# Patient Record
Sex: Male | Born: 1935
Health system: Southern US, Community
[De-identification: ages and names within clinical notes are randomized; demographics above are authoritative.]

## PROBLEM LIST (undated history)

## (undated) ENCOUNTER — Emergency Department (HOSPITAL_COMMUNITY): Payer: Commercial Managed Care - HMO | Source: Home / Self Care

## (undated) DIAGNOSIS — E785 Hyperlipidemia, unspecified: Secondary | ICD-10-CM

## (undated) DIAGNOSIS — F32A Depression, unspecified: Secondary | ICD-10-CM

## (undated) DIAGNOSIS — E119 Type 2 diabetes mellitus without complications: Secondary | ICD-10-CM

## (undated) DIAGNOSIS — F03918 Unspecified dementia, unspecified severity, with other behavioral disturbance: Secondary | ICD-10-CM

## (undated) DIAGNOSIS — R739 Hyperglycemia, unspecified: Secondary | ICD-10-CM

## (undated) DIAGNOSIS — H409 Unspecified glaucoma: Secondary | ICD-10-CM

## (undated) DIAGNOSIS — I10 Essential (primary) hypertension: Secondary | ICD-10-CM

## (undated) DIAGNOSIS — I639 Cerebral infarction, unspecified: Secondary | ICD-10-CM

## (undated) DIAGNOSIS — M1990 Unspecified osteoarthritis, unspecified site: Secondary | ICD-10-CM

## (undated) DIAGNOSIS — F329 Major depressive disorder, single episode, unspecified: Secondary | ICD-10-CM

## (undated) DIAGNOSIS — F0391 Unspecified dementia with behavioral disturbance: Secondary | ICD-10-CM

## (undated) DIAGNOSIS — I2510 Atherosclerotic heart disease of native coronary artery without angina pectoris: Secondary | ICD-10-CM

## (undated) HISTORY — DX: Cerebral infarction, unspecified: I63.9

## (undated) HISTORY — DX: Essential (primary) hypertension: I10

## (undated) HISTORY — DX: Unspecified osteoarthritis, unspecified site: M19.90

## (undated) HISTORY — DX: Hyperlipidemia, unspecified: E78.5

## (undated) HISTORY — DX: Major depressive disorder, single episode, unspecified: F32.9

## (undated) HISTORY — DX: Depression, unspecified: F32.A

## (undated) HISTORY — DX: Unspecified glaucoma: H40.9

## (undated) HISTORY — DX: Type 2 diabetes mellitus without complications: E11.9

## (undated) HISTORY — DX: Atherosclerotic heart disease of native coronary artery without angina pectoris: I25.10

## (undated) HISTORY — PX: OTHER SURGICAL HISTORY: SHX169

---

## 2000-02-19 ENCOUNTER — Encounter: Payer: Self-pay | Admitting: Internal Medicine

## 2000-02-19 ENCOUNTER — Encounter: Payer: Self-pay | Admitting: Emergency Medicine

## 2000-02-19 ENCOUNTER — Inpatient Hospital Stay (HOSPITAL_COMMUNITY): Admission: EM | Admit: 2000-02-19 | Discharge: 2000-02-20 | Payer: Self-pay | Admitting: Emergency Medicine

## 2001-06-19 ENCOUNTER — Encounter: Payer: Self-pay | Admitting: Otolaryngology

## 2001-06-19 ENCOUNTER — Encounter: Admission: RE | Admit: 2001-06-19 | Discharge: 2001-06-19 | Payer: Self-pay | Admitting: *Deleted

## 2003-06-15 ENCOUNTER — Inpatient Hospital Stay (HOSPITAL_COMMUNITY): Admission: EM | Admit: 2003-06-15 | Discharge: 2003-06-19 | Payer: Self-pay | Admitting: Emergency Medicine

## 2003-07-12 ENCOUNTER — Emergency Department (HOSPITAL_COMMUNITY): Admission: EM | Admit: 2003-07-12 | Discharge: 2003-07-12 | Payer: Self-pay | Admitting: Emergency Medicine

## 2003-10-09 ENCOUNTER — Encounter: Admission: RE | Admit: 2003-10-09 | Discharge: 2003-10-09 | Payer: Self-pay | Admitting: Family Medicine

## 2004-04-10 ENCOUNTER — Emergency Department (HOSPITAL_COMMUNITY): Admission: EM | Admit: 2004-04-10 | Discharge: 2004-04-10 | Payer: Self-pay | Admitting: Emergency Medicine

## 2004-05-08 ENCOUNTER — Emergency Department (HOSPITAL_COMMUNITY): Admission: EM | Admit: 2004-05-08 | Discharge: 2004-05-08 | Payer: Self-pay | Admitting: Emergency Medicine

## 2004-08-06 ENCOUNTER — Emergency Department (HOSPITAL_COMMUNITY): Admission: EM | Admit: 2004-08-06 | Discharge: 2004-08-06 | Payer: Self-pay | Admitting: Emergency Medicine

## 2008-12-30 ENCOUNTER — Emergency Department (HOSPITAL_COMMUNITY): Admission: EM | Admit: 2008-12-30 | Discharge: 2008-12-30 | Payer: Self-pay | Admitting: Family Medicine

## 2009-04-23 ENCOUNTER — Encounter: Admission: RE | Admit: 2009-04-23 | Discharge: 2009-04-23 | Payer: Self-pay | Admitting: Family Medicine

## 2010-12-18 NOTE — Discharge Summary (Signed)
[name], [name] NO.:  [id number]   MEDICAL RECORD NO.:  [id number]                   PATIENT TYPE:  INP   LOCATION:  6522                                 FACILITY:  [facility]   PHYSICIAN:  Meade Maw, M.D.                 DATE OF BIRTH:  11/17/1921   DATE OF ADMISSION:  06/16/2003  DATE OF DISCHARGE:  06/19/2003                                 DISCHARGE SUMMARY   ADMITTING DIAGNOSES:  1. Atypical chest pain.  2. Palpitations.  3. Non-insulin-dependent diabetes.  4. Hypertension.   DISCHARGE DIAGNOSES:  1. Borderline obstructive coronary artery disease to the left anterior     descending by catheterization, June 18, 2003.  2. Hypertension, controlled.  3. Palpitations, status post Holter monitor, June 03, 2003 through     June 05, 2003, revealing sinus rhythm with frequent premature atrial     contractions and premature ventricular contractions.  4. Non-insulin-dependent diabetes, controlled.   PROCEDURE:  Left heart catheterization, June 18, 2003.   COMPLICATIONS:  None.   DISCHARGE STATUS:  Stable.  Improved.   HISTORY OF PRESENT ILLNESS:  Please see complete H&P for details, but in  short, Mr. [name] is a very pleasant 77 year old male with history of  diabetes and hypertension.  He presented to the emergency room on June 16, 2003 with complaint of chest discomfort as well as palpitations that  began while he was having an argument with his daughter.  He was given  sublingual nitroglycerin in the emergency room with some relief.  He had  been having palpitations prior to this admission.  He was seen by Dr. Meade Maw and had a Holter monitor placed, which showed simple PVCs and PABs.  He was started on Toprol at that point.   PHYSICAL EXAM ON ADMISSION:  Please see complete H&P.  VITAL SIGNS:  Vital signs were stable.  He was afebrile.  SKIN:  Skin was warm and dry.  LUNGS:  Lungs were clear.  HEART:  Heart  regular rate and rhythm.  No murmur.  ABDOMEN:  Abdomen was benign with normal bowel sounds.  EXTREMITIES:  No peripheral edema.  NEUROLOGIC:  No neurological deficits.   LABORATORY DATA:  Admission labs including CBC, BMP and cardiac markers were  all normal.   EKG showed a normal sinus rhythm with diffuse ST and T wave changes.   HOSPITAL COURSE:  The patient was admitted to rule out MI with serial  enzymes.  He was started on IV nitroglycerin and IV heparin.  Plans for a  stress test were implemented.   By day 2 of admission, he had no further complaints of chest discomfort.  Cardiac enzymes were all negative.  Vital signs remained stable.  Exam was  unchanged.  IV nitroglycerin and IV heparin were discontinued at that point.  He was scheduled for a stress Cardiolite study.  Adenosine Cardiolite was actually performed on June 17, 2003.  He did  have some anterolateral ST depression of approximately 2 mm with exercise.  Review of Cardiolite images revealed decreased uptake in the anterior and  inferior basilar sections.  There was some partial reversibility of the  anterior wall.   At this point, he was scheduled for cardiac catheterization.  He was taken  to the heart cath lab, June 18, 2003, for ventriculography and coronary  definitions by Dr. Fraser Din.  Results showed a normal left main, the LAD with  a mid 60-70% lesion with significant calcification.  The circumflex had no  significant disease.  The RCA was normal.  There is no documentation at the  time of this dictation as to LV function.  EF was noted to be 30% by nuclear  study, June 17, 2003.   The patient underwent successful PCI and CYPHER stent placement to the mid  LAD by Dr. Katrinka Blazing.  He tolerated the procedure well.  The rest of his  admission was essentially uneventful.  Vital signs remained stable.  EKG  post procedure showed some deepening of the T wave inversions in the  inferior leads only.   Serum creatinine was 1.0 post catheterization.  CBC  was stable.   He was discharged home without incident.   MEDICATIONS:  1. Plavix 75 mg daily.  2. Aspirin 325 mg daily.  3. Toprol 50 mg daily.  4. Avandia 4 mg daily.  5. Cozaar 50 mg daily.  6. Lipitor 40 mg daily.  7. Glucovance 5/500 mg b.i.d.  He was instructed not to restart his     Glucovance until Friday, June 21, 2003.   ACTIVITY:  He is not to undertake any strenuous activities for the next two  days, i.e., no lifting more than 5 pounds, bending, stooping or straining.  He is not to drive today.  He may resume his normal activities on Friday,  June 21, 2003.   DIET:  He is to maintain a low-salt, low-fat, low-cholesterol diet as well  as maintain his diabetic diet restrictions.   SPECIAL DISCHARGE INSTRUCTIONS:  He may shower when he gets home.  Gently  wash the right groin catheterization site with warm water and soap.  He has  been instructed not to soak in a bathtub for the next several days.   If he experiences pain, bruising, swelling to the catheterization site or  any problems, he is to contact Dr. Fraser Din.   FOLLOWUP:  He has a followup appointment to see Dr. Fraser Din on Wednesday,  July 03, 2003, at 11 a.m.      Adrian Saran, N.P.                        Meade Maw, M.D.    HB/MEDQ  D:  06/19/2003  T:  06/20/2003  Job:  [id number]

## 2010-12-18 NOTE — Cardiovascular Report (Signed)
[name], [name] NO.:  [id number]   MEDICAL RECORD NO.:  [id number]                   PATIENT TYPE:  INP   LOCATION:  2022                                 FACILITY:  [facility]   PHYSICIAN:  Meade Maw, M.D.                 DATE OF BIRTH:  Jul 13, 1922   DATE OF PROCEDURE:  06/18/2003  DATE OF DISCHARGE:                              CARDIAC CATHETERIZATION   INDICATIONS FOR PROCEDURE:  Ongoing chest pain, reversible ischemia noted on  Cardiolite.   PROCEDURE:  After obtaining written, informed consent the patient was  brought to the cardiac catheterization laboratory in the postabsorptive  state.  Preoperative sedation was achieved using Versed 2 mg IV.  The right  groin was prepped and draped in a usual sterile fashion.  Local anesthesia  was achieved using 1% Xylocaine.  A 6-French hemostasis sheath was placed  into the right femoral artery using the modified Seldinger technique.  Selective coronary angiography was performed using a JL4, JR4 Judkins  catheter.  Multiple views were obtained.  All catheter exchanges were made  over a guidewire.  Single plane ventriculogram was performed in the RAO  position using a 6-French pigtail curved catheter.  The films were reviewed  with Lyn Records, M.D.   FINDINGS:  The aortic pressure was 112/61.  LV pressure was 111/6.  EDP is  12.  Single plane ventriculogram revealed normal wall motion, ejection  fraction 65-70%.   CORONARY ANGIOGRAPHY:  The left main coronary artery bifurcates into the  left anterior descending and circumflex vessel.  There is no disease in the  left main coronary artery.   Left anterior descending gives rise to multiple diagonals.  Following the  third diagonal there is a 60-70% lesion noted.  The left anterior descending  then gives rise to another moderate sized diagonal and goes on to end as an  apical branch.   Circumflex vessel is a large vessel.  Gives rise to an early  large OM1,  smaller OM2, large OM3, and ends as a lateral branch.  There is no disease  in the circumflex or its branches.   Right coronary artery is a large artery.  Gives rise to three small RV  marginals, a PDA, and a PL branch.  There are luminal irregularities in the  right coronary artery only.   FINAL IMPRESSION:  Borderline disease in the mid left anterior descending.  However, the patient has had recurrent chest pain and reversible ischemia in  this region.  Lyn Records, M.D. was consulted.  He will proceed with  intervention on the mid left anterior descending lesion.  Meade Maw, M.D.    HP/MEDQ  D:  06/18/2003  T:  06/18/2003  Job:  [id number]   cc:   Bryan Lemma. Manus Gunning, M.D.  [address] [place]  [place] [address]  Fax: [phone]

## 2010-12-18 NOTE — Cardiovascular Report (Signed)
[name], [name] NO.:  [id number]   MEDICAL RECORD NO.:  [id number]                   PATIENT TYPE:  INP   LOCATION:  2022                                 FACILITY:  [facility]   PHYSICIAN:  Lesleigh Noe, M.D.            DATE OF BIRTH:  1922/07/08   DATE OF PROCEDURE:  06/18/2003  DATE OF DISCHARGE:                              CARDIAC CATHETERIZATION   PROCEDURE PERFORMED:  Drug-eluting stent implantation in the proximal left  anterior descending.   INDICATIONS:  Borderline significant proximal LAD lesion and an abnormal  Cardiolite documenting ischemia in the distal territory beyond the  obstruction.   PROCEDURE PERFORMED:  1. Cutting balloon angioplasty.  2. Drug-eluting stent implantation.  3. Post dilatation with balloon angioplasty.   DESCRIPTION OF PROCEDURE:  After informed consent, 4100 units of IV heparin  was administered as a double bolus followed by an infusion of Integrilin was  started and 300 mg of Plavix was administered.   We used an [employer] guidewire and a 7-French #4 left Judkins guide  catheter.  We obtained position across the stenosis in the LAD and used a 10  mm x 3.5 mm diameter cutting balloon.  Peak pressure 14 atmospheres.  Two  balloon inflations were performed.  We then implanted a 3.5 x 18 mm long  Cypher stent and deployed it to 16 atmospheres which was a 3.7 mm diameter.  We then post dilated with a 4.0 x 15 mm Quantum Maverick high pressure  balloon to 17 atmospheres which was an effective diameter of 4.15 mm.  The  patient had chest discomfort citing it less intense than the discomfort that  he had that required admission to the hospital, but certainly of the same  quality during each balloon inflation.  ACT post procedure approximately 241  seconds.  The patient was taken to the holding area in stable condition with  pending septal angiographic results.   RESULTS:  60-70% proximal LAD within a  heavily calcified segment reduced to  less than 10% with TIMI grade 3 flow following Cypher drug-eluting stent  implantation and expansion to 4 mm in diameter with high pressure  ballooning.   CONCLUSION:  Successful drug-eluting stent implantation in the proximal left  anterior descending with reduction in 70% stenosis to less than 10% with  TIMI grade 3 flow.   PLAN:  Aspirin and Plavix for six months.  Integrilin x18 hours.  Discharge  June 19, 2003.                                               Lesleigh Noe, M.D.    HWS/MEDQ  D:  06/18/2003  T:  06/18/2003  Job:  [id number]   cc:   Meade Maw, M.D.  [address]  [place]  [place] [address]  Fax: [phone]   Bryan Lemma. Manus Gunning, M.D.  [address] [place]  [place] [address]  Fax: [phone]

## 2010-12-18 NOTE — Discharge Summary (Signed)
[facility]  Patient:    [name], [name]                        MRN: [id number] Adm. Date:  [id number] Disc. Date: [id number] Attending:  Edwyna Perfect Dictator:   Bernerd Pho, M.D. CC:         [facility]                           Discharge Summary  DISCHARGE DIAGNOSIS:  Left-sided chest pain secondary to musculoskeletal etiology.  DISCHARGE MEDICATIONS: 1. Motrin 800 mg p.o. t.i.d. x next 14 days, then can wean if tolerated. 2. The patient is to resume Glucotrol at the dose he was on prior to    admission.  FOLLOW-UP:  The patient is to see his primary care doctor at [facility].  CONSULTATIONS:  None.  PROCEDURES:  Echocardiogram performed on February 19, 2000, showed a normal left ventricle with mild asymmetric left ventricular hypertrophy.  The ejection fraction is normal.  CHIEF COMPLAINT:  Left-sided chest pain.  HISTORY OF PRESENT ILLNESS:  The patient is a 77 year old African-American male with a history of diabetes who presents with a three to four month history of stabbing left-sided chest pain.  The patient states that his pain is brought on by certain positions and is alleviated by other positions.  He states that his pain is particularly worse when he pushes on his left rib cage. The patient initially treated this pain with Tylenol and aspirin with some success, but lately it has provided no relief.  The patient denies any shortness of breath, nausea, vomiting, diaphoresis, dyspnea on exertion, or pain with exertion.  The patient also denies any history of trauma or fracture to the chest and denies any weight loss, decrease in appetite, decrease in energy.  Patient acknowledges cough but no sputum production or hemoptysis. The patient denies any cardiac history and he denies any pulmonary diseases. He denies any history of pneumonia.  Because the pain has persisted, the patient decided to present to the  emergency department this morning.  PAST MEDICAL HISTORY: 1. Non-insulin-dependent diabetes mellitus for the past eight years. 2. Epidermal inclusion cysts.  MEDICATIONS ON ADMISSION: 1. Glucotrol unknown dose b.i.d. 2. Tylenol/aspirin prn.  ALLERGIES:  No known drug allergies.  FAMILY HISTORY:  The patient denies any family history of early MIs or CAD.  SOCIAL HISTORY:  The patient states that he has a third to fourth grade education.  The patient has been married to his second wife for the past 24 years and has five kids from his first marriage.  The patient is retired and before retirement used to work at the [employer].  He quit smoking roughly 40 years ago and denies any alcohol or drug use.  PHYSICAL EXAMINATION:  VITAL SIGNS:  Temperature 97.1, blood pressure 177/99, heart rate 65, respirations 20, oxygen saturation on room air 96%.  GENERAL:  The patient is in no apparent distress and appears comfortable.  HEENT:  Pupils equal, round, and reactive to light.  His sclerae, conjunctivae, and cornea are clear.  And his nasal and ______  without any lesions.  CARDIOVASCULAR:  Regular rate and rhythm without any murmurs, clicks, gallops, rubs, or JVD.  PULMONARY:  Clear to auscultation bilaterally with good air movement.  The patient has reproducible point tenderness at the left mid axillary line near the subcostal margin.  ABDOMEN:  The patient has roughly 1 cm mobile nodule below his xiphoid. Otherwise his abdomen is benign, nontender, nondistended, no hepatosplenomegaly, and has positive bowel sounds throughout.  EXTREMITIES:  No edema.  NEUROLOGIC:  Nonfocal.  ADMISSION LABORATORIES:  Admission electrolytes are as follows:  Sodium 139, potassium 4.1, chloride 105, bicarb 32, BUN 8, creatinine .9, glucose 193. The patients liver function tests are as follows:  AST 41, ALT 39, alk phos 49, total bilirubin .7.  The patients complete blood count is as  follows: White blood cell count 4.2, hemoglobin 13.7, and platelets 180.  The patients initial set of cardiac enzymes are negative.  Troponin I at .04, CK 325, and CK-MB of 1.7.  His PTT is 30, and his PT-INR is 1.1.  The patients chest x-ray was without any acute disease, and his rib films are negative for any rib fractures.  The patients EKG showed normal sinus rhythm with inverted T waves in leads V3 through V6 and an AVL.  HOSPITAL COURSE:  LEFT-SIDED CHEST PAIN:  Because of the patients inverted T waves, he was admitted to rule out for an acute MI.  The patient ruled out for an acute MI by serial EKGs, cardiac enzymes, and telemetry.  The patients EKG did not change in regards to his inverted T waves and his anterolateral leads. Because the patients chest pain was exacerbated by certain positions and was reproducible by palpation of the left mid axillary line near the subcostal margin, it was thought that his pain was secondary to musculoskeletal etiology.  It was thought that the patient might benefit from ______  NSAID therapy, and the patient was discharged with instructions to take Motrin 800 mg p.o. t.i.d. for the next 14 days.  The patient could then titrate his NSAID use based on symptoms.  DIET:   Low-sugar diabetic diet.  RESIDENT PHYSICIAN:  Leory Plowman, M.D. and Bernerd Pho, M.D.  ATTENDING PHYSICIAN:  C. Ulyess Mort, M.D. DD:  02/22/00 TD:  02/24/00 Job: [id number]

## 2010-12-18 NOTE — H&P (Signed)
NAMEBROLY, HATFIELD NO.:  [id number]   MEDICAL RECORD NO.:  [id number]                   PATIENT TYPE:  INP   LOCATION:  2022                                 FACILITY:  [facility]   PHYSICIAN:  Colleen Can. Deborah Chalk, M.D.            DATE OF BIRTH:  [birth year]   DATE OF ADMISSION:  06/15/2003  DATE OF DISCHARGE:                                HISTORY & PHYSICAL   CHIEF COMPLAINT:  Chest pain and palpitations.   HISTORY:  [name] is a 77 year old black male who has multiple medical  problems.  He presents to the emergency room accompanied by his daughter,  [name].  He had had the onset of chest discomfort as well as palpitations  that occurred during an argument with his daughter.  He was subsequently  brought to the emergency room where he was given nitroglycerin with  subsequent resolution.  The patient is a very poor historian.  He notes that  he had seen Meade Maw, M.D. this past week.  There is a question of a  Holter monitor that was placed.  He is unsure if he actually had a follow-up  appointment.  Changes in his medicines may have occurred but he is not sure.  He was subsequently seen and evaluated in the emergency room, was admitted  for further management.   PAST MEDICAL HISTORY:  1. Non-insulin-dependent diabetes for 13-14 years.  2. Hypertension.  3. He denies a history of stroke, cancer, or heart attack.  4. He is unsure about his cholesterol levels.  5. He is unsure if he has had previous surgical procedures.   ALLERGIES:  Unknown.   MEDICATIONS:  1. Cozaar.  2. Glucophage.  3. Avandia.  4. Questionable new heart medicine.  His doses are unknown.   FAMILY HISTORY:  He states that his parents just died.  He denies a  history of heart disease with his parents.   SOCIAL HISTORY:  He remarried approximately one year ago.  He previously was  a widower.  He has had no recent tobacco or alcohol.  He is very involved in  his  church.   REVIEW OF SYSTEMS:  He denies being dizzy or lightheaded.  He has had chest  pain off and on for quite some time.  Duration is exactly unknown.  He  states he has had palpitations off and on.  He feels that most of his  problems are stress related.  He admits to being depressed from the death of  his former wife.  He is not happy with his current wife due to differences  in religious beliefs.   PHYSICAL EXAMINATION:  GENERAL:  He is anxious.  He is crying.  He almost  seems somewhat confused at times.  He is in no acute distress.  VITAL SIGNS:  Blood pressure 96/67, heart rate 60.  SKIN:  Warm and dry.  Color is  unremarkable.  LUNGS:  Basically clear.  He does have poor effort.  CARDIAC:  Regular rhythm.  ABDOMEN:  Soft, positive bowel sounds.  EXTREMITIES:  Without edema.   LABORATORIES:  Hematocrit 33, white count 4000.  Chemistries are normal.  Glucose 109.  His cardiac markers are negative.  EKG showed sinus rhythm.  There are diffuse ST and T-wave changes which are unchanged from previous  tracings.   OVERALL IMPRESSION:  1. Atypical chest pain.  2. Palpitations with recent Holter with questionable results.  3. Diabetes.  4. Hypertension.   PLAN:  He is admitted to the service of Meade Maw, M.D.  We will need to  obtain his Holter monitor results.  He is currently on IV nitroglycerin and  heparin and will continue those.  He is pain-free.  Will make consideration  for Cardiolite in the morning versus outpatient testing, but will defer that  decision to Meade Maw, M.D.      Juanell Fairly C. Earl Gala, N.P.                 Colleen Can. Deborah Chalk, M.D.    LCO/MEDQ  D:  06/16/2003  T:  06/16/2003  Job:  [id number]

## 2011-02-05 LAB — BASIC METABOLIC PANEL
BUN: 8 mg/dL (ref 4–21)
Creatinine: 1.1 mg/dL (ref 0.6–1.3)
Glucose: 134 mg/dL
Potassium: 5.2 mmol/L (ref 3.4–5.3)
Sodium: 144 mmol/L (ref 137–147)

## 2011-02-05 LAB — LIPID PANEL
Cholesterol: 166 mg/dL (ref 0–200)
HDL: 25 mg/dL — AB (ref 35–70)
LDL Cholesterol: 78 mg/dL
Triglycerides: 365 mg/dL — AB (ref 40–160)

## 2011-02-05 LAB — HEPATIC FUNCTION PANEL
ALT: 29 U/L (ref 10–40)
AST: 36 U/L (ref 14–40)
Alkaline Phosphatase: 42 U/L (ref 25–125)
Bilirubin, Total: 0.5 mg/dL

## 2011-02-05 LAB — HEMOGLOBIN A1C: Hgb A1c MFr Bld: 6.2 % — AB (ref 4.0–6.0)

## 2012-02-16 LAB — BASIC METABOLIC PANEL
BUN: 15 mg/dL (ref 4–21)
Creatinine: 1.4 mg/dL — AB (ref 0.6–1.3)
Glucose: 135 mg/dL
Potassium: 4.7 mmol/L (ref 3.4–5.3)
Sodium: 141 mmol/L (ref 137–147)

## 2012-02-16 LAB — LIPID PANEL
Cholesterol: 136 mg/dL (ref 0–200)
HDL: 33 mg/dL — AB (ref 35–70)
LDL Cholesterol: 79 mg/dL
Triglycerides: 106 mg/dL (ref 40–160)

## 2012-02-16 LAB — HEMOGLOBIN A1C: Hgb A1c MFr Bld: 6.2 % — AB (ref 4.0–6.0)

## 2012-02-16 LAB — HEPATIC FUNCTION PANEL
ALT: 30 U/L (ref 10–40)
AST: 49 U/L — AB (ref 14–40)
Alkaline Phosphatase: 47 U/L (ref 25–125)
Bilirubin, Total: 1.1 mg/dL

## 2012-08-21 LAB — LIPID PANEL
Cholesterol: 158 mg/dL (ref 0–200)
HDL: 37 mg/dL (ref 35–70)
LDL Cholesterol: 85 mg/dL
Triglycerides: 181 mg/dL — AB (ref 40–160)

## 2012-08-21 LAB — BASIC METABOLIC PANEL
BUN: 12 mg/dL (ref 4–21)
Creatinine: 1.2 mg/dL (ref 0.6–1.3)
Glucose: 212 mg/dL
Potassium: 3.9 mmol/L (ref 3.4–5.3)
Sodium: 141 mmol/L (ref 137–147)

## 2012-08-21 LAB — HEPATIC FUNCTION PANEL
ALT: 21 U/L (ref 10–40)
AST: 29 U/L (ref 14–40)
Alkaline Phosphatase: 41 U/L (ref 25–125)

## 2012-08-21 LAB — HEMOGLOBIN A1C: Hgb A1c MFr Bld: 7.4 % — AB (ref 4.0–6.0)

## 2013-03-26 ENCOUNTER — Ambulatory Visit: Payer: Self-pay | Admitting: Internal Medicine

## 2013-06-02 HISTORY — PX: CARDIAC CATHETERIZATION: SHX172

## 2013-06-22 ENCOUNTER — Encounter: Payer: Self-pay | Admitting: Internal Medicine

## 2013-06-22 ENCOUNTER — Other Ambulatory Visit (INDEPENDENT_AMBULATORY_CARE_PROVIDER_SITE_OTHER): Payer: Medicare Other

## 2013-06-22 ENCOUNTER — Ambulatory Visit (INDEPENDENT_AMBULATORY_CARE_PROVIDER_SITE_OTHER): Payer: Medicare Other | Admitting: Internal Medicine

## 2013-06-22 VITALS — BP 140/72 | HR 88 | Temp 98.9°F | Ht 71.0 in | Wt 182.4 lb

## 2013-06-22 DIAGNOSIS — I10 Essential (primary) hypertension: Secondary | ICD-10-CM

## 2013-06-22 DIAGNOSIS — M1990 Unspecified osteoarthritis, unspecified site: Secondary | ICD-10-CM | POA: Insufficient documentation

## 2013-06-22 DIAGNOSIS — E785 Hyperlipidemia, unspecified: Secondary | ICD-10-CM

## 2013-06-22 DIAGNOSIS — Z0000 Encounter for general adult medical examination without abnormal findings: Secondary | ICD-10-CM

## 2013-06-22 DIAGNOSIS — Z23 Encounter for immunization: Secondary | ICD-10-CM

## 2013-06-22 DIAGNOSIS — I2510 Atherosclerotic heart disease of native coronary artery without angina pectoris: Secondary | ICD-10-CM

## 2013-06-22 DIAGNOSIS — H409 Unspecified glaucoma: Secondary | ICD-10-CM | POA: Insufficient documentation

## 2013-06-22 DIAGNOSIS — E119 Type 2 diabetes mellitus without complications: Secondary | ICD-10-CM

## 2013-06-22 DIAGNOSIS — E1151 Type 2 diabetes mellitus with diabetic peripheral angiopathy without gangrene: Secondary | ICD-10-CM | POA: Insufficient documentation

## 2013-06-22 DIAGNOSIS — I25119 Atherosclerotic heart disease of native coronary artery with unspecified angina pectoris: Secondary | ICD-10-CM | POA: Insufficient documentation

## 2013-06-22 LAB — BASIC METABOLIC PANEL
BUN: 11 mg/dL (ref 6–23)
CO2: 34 mEq/L — ABNORMAL HIGH (ref 19–32)
Calcium: 9.6 mg/dL (ref 8.4–10.5)
Chloride: 99 mEq/L (ref 96–112)
Creatinine, Ser: 1.1 mg/dL (ref 0.4–1.5)
GFR: 84.56 mL/min (ref >60.00)
Glucose, Bld: 184 mg/dL — ABNORMAL HIGH (ref 70–99)
Potassium: 4.3 mEq/L (ref 3.5–5.1)
Sodium: 139 mEq/L (ref 135–145)

## 2013-06-22 LAB — MICROALBUMIN / CREATININE URINE RATIO
Creatinine,U: 218.3 mg/dL
Microalb Creat Ratio: 3.9 mg/g (ref 0.0–30.0)
Microalb, Ur: 8.5 mg/dL — ABNORMAL HIGH (ref 0.0–1.9)

## 2013-06-22 LAB — LIPID PANEL
Cholesterol: 159 mg/dL (ref 0–200)
HDL: 38.1 mg/dL — ABNORMAL LOW (ref >39.00)
LDL Cholesterol: 91 mg/dL (ref 0–99)
Total CHOL/HDL Ratio: 4
Triglycerides: 150 mg/dL — ABNORMAL HIGH (ref 0.0–149.0)
VLDL: 30 mg/dL (ref 0.0–40.0)

## 2013-06-22 LAB — HEMOGLOBIN A1C: Hgb A1c MFr Bld: 7.3 % — ABNORMAL HIGH (ref 4.6–6.5)

## 2013-06-22 MED ORDER — ASPIRIN EC 81 MG PO TBEC: 81.0000 mg | DELAYED_RELEASE_TABLET | Freq: Every day | ORAL | Status: DC

## 2013-06-22 NOTE — Progress Notes (Signed)
Pre-visit discussion using our clinic review tool. No additional management support is needed unless otherwise documented below in the visit note.  

## 2013-06-22 NOTE — Progress Notes (Signed)
Subjective:    Patient ID: [name], male    DOB: 02-13-1922, 79 y.o.   MRN: [id number]  HPI  New patient to me, here to establish care   Here for medicare wellness  Diet: heart healthy, diabetic Physical activity: sedentary Depression/mood screen: negative Hearing: impaired - follows with audiology - unable to afford hearing aides Visual acuity: grossly normal, performs annual eye exam  ADLs: capable Fall risk: none Home safety: good Cognitive evaluation: intact to orientation, naming, recall and repetition EOL planning: adv directives, full code/ I agree  I have personally reviewed and have noted 1. The patient's medical and social history 2. Their use of alcohol, tobacco or illicit drugs 3. Their current medications and supplements 4. The patient's functional ability including ADL's, fall risks, home safety risks and hearing or visual impairment. 5. Diet and physical activities 6. Evidence for depression or mood disorders  Also reviewed chronic medical issues: DM2, HTN, dyslipidemia   Past Medical History  Diagnosis Date  . Arthritis   . Type 2 diabetes mellitus   . Depression   . Dyslipidemia   . Hypertension   . CAD (coronary artery disease)     nonobst: LAD per cath 06/2003    Family History  Problem Relation Age of Onset  . Arthritis Mother   . Breast cancer Mother   . Arthritis Father   . Diabetes Other   . Arthritis Other   . Colon cancer Other   . Stroke Other    History  Substance Use Topics  . Smoking status: Not on file  . Smokeless tobacco: Not on file  . Alcohol Use: Not on file    Review of Systems  Constitutional: Negative for fever, activity change, appetite change, fatigue and unexpected weight change.  Respiratory: Negative for cough, chest tightness, shortness of breath and wheezing.   Cardiovascular: Negative for chest pain, palpitations and leg swelling.  Neurological: Negative for dizziness, weakness and headaches.   Psychiatric/Behavioral: Negative for dysphoric mood. The patient is not nervous/anxious.   All other systems reviewed and are negative.       Objective:   Physical Exam BP 140/72  Pulse 88  Temp(Src) 98.9 F (37.2 C) (Oral)  Ht 5\' 11"  (1.803 m)  Wt 182 lb 6.4 oz (82.736 kg)  BMI 25.45 kg/m2  SpO2 96% Wt Readings from Last 3 Encounters:  06/22/13 182 lb 6.4 oz (82.736 kg)   Constitutional: he appears well-developed and well-nourished. No distress. dtr at side HENT: Head: Normocephalic and atraumatic. Ears: B TMs ok, no erythema or effusion; Nose: Nose normal. Mouth/Throat: Oropharynx is clear and moist. No oropharyngeal exudate.  Eyes: Conjunctivae and EOM are normal. Pupils are equal, round, and reactive to light. No scleral icterus.  Neck: Normal range of motion. Neck supple. No JVD present. No thyromegaly present.  Cardiovascular: Normal rate, regular rhythm and normal heart sounds.  No murmur heard. No BLE edema. Pulmonary/Chest: Effort normal and breath sounds normal. No respiratory distress. he has no wheezes.  Abdominal: Soft. Bowel sounds are normal. he exhibits no distension. There is no tenderness. no masses Musculoskeletal: Normal range of motion, no joint effusions. No gross deformities Neurological: he is alert and oriented to person, place, and time. No cranial nerve deficit. Coordination, balance, strength, speech and gait are normal.  Skin: Skin is warm and dry. No rash noted. No erythema.  Psychiatric: he has a normal mood and affect. behavior is normal. Judgment and thought content normal.  No results found for  this basename: WBC, HGB, HCT, PLT, GLUCOSE, CHOL, TRIG, HDL, LDLDIRECT, LDLCALC, ALT, AST, NA, K, CL, CREATININE, BUN, CO2, TSH, PSA, INR, GLUF, HGBA1C, MICROALBUR      Assessment & Plan:   AWV/v70.0 - Today patient counseled on age appropriate routine health concerns for screening and prevention, each reviewed and up to date or declined. Immunizations  reviewed and up to date or declined. Labs ordeed and reviewed. Risk factors for depression reviewed and negative. Hearing function and visual acuity are intact. ADLs screened and addressed as needed. Functional ability and level of safety reviewed and appropriate. Education, counseling and referrals performed based on assessed risks today. Patient provided with a copy of personalized plan for preventive services. Will send for ROI  Also See problem list. Medications and labs reviewed today.

## 2013-06-22 NOTE — Assessment & Plan Note (Signed)
On statin - Check lipids annually, titrate as needed The current medical regimen is effective;  continue present plan and medications.   

## 2013-06-22 NOTE — Patient Instructions (Addendum)
It was good to see you today.  We have reviewed your prior records including labs and tests today  Health Maintenance reviewed - flu shot and pneumonia shots updated, tetnus booster next visit -all recommended immunizations and age-appropriate screenings are up-to-date.  Test(s) ordered today. Your results will be released to MyChart (or called to you) after review, usually within 72hours after test completion. If any changes need to be made, you will be notified at that same time.  Medications reviewed and updated, begin aspirin 81mg  daily -no other changes recommended at this time.  we'll make referral to eye specialist. Our office will contact you regarding appointment(s) once made.  we will send to your prior provider(s) for "release of records" as discussed today.  Please schedule followup in 3-4 months for diabetes check, call sooner if problems.  Health Maintenance, Males A healthy lifestyle and preventative care can promote health and wellness.  Maintain regular health, dental, and eye exams.  Eat a healthy diet. Foods like vegetables, fruits, whole grains, low-fat dairy products, and lean protein foods contain the nutrients you need without too many calories. Decrease your intake of foods high in solid fats, added sugars, and salt. Get information about a proper diet from your caregiver, if necessary.  Regular physical exercise is one of the most important things you can do for your health. Most adults should get at least 150 minutes of moderate-intensity exercise (any activity that increases your heart rate and causes you to sweat) each week. In addition, most adults need muscle-strengthening exercises on 2 or more days a week.   Maintain a healthy weight. The body mass index (BMI) is a screening tool to identify possible weight problems. It provides an estimate of body fat based on height and weight. Your caregiver can help determine your BMI, and can help you achieve or maintain  a healthy weight. For adults 20 years and older:  A BMI below 18.5 is considered underweight.  A BMI of 18.5 to 24.9 is normal.  A BMI of 25 to 29.9 is considered overweight.  A BMI of 30 and above is considered obese.  Maintain normal blood lipids and cholesterol by exercising and minimizing your intake of saturated fat. Eat a balanced diet with plenty of fruits and vegetables. Blood tests for lipids and cholesterol should begin at age 62 and be repeated every 5 years. If your lipid or cholesterol levels are high, you are over 50, or you are a high risk for heart disease, you may need your cholesterol levels checked more frequently.Ongoing high lipid and cholesterol levels should be treated with medicines, if diet and exercise are not effective.  If you smoke, find out from your caregiver how to quit. If you do not use tobacco, do not start.  Lung cancer screening is recommended for adults aged 4 80 years who are at high risk for developing lung cancer because of a history of smoking. Yearly low-dose computed tomography (CT) is recommended for people who have at least a 30-pack-year history of smoking and are a current smoker or have quit within the past 15 years. A pack year of smoking is smoking an average of 1 pack of cigarettes a day for 1 year (for example: 1 pack a day for 30 years or 2 packs a day for 15 years). Yearly screening should continue until the smoker has stopped smoking for at least 15 years. Yearly screening should also be stopped for people who develop a health problem that would prevent  them from having lung cancer treatment.  If you choose to drink alcohol, do not exceed 2 drinks per day. One drink is considered to be 12 ounces (355 mL) of beer, 5 ounces (148 mL) of wine, or 1.5 ounces (44 mL) of liquor.  Avoid use of street drugs. Do not share needles with anyone. Ask for help if you need support or instructions about stopping the use of drugs.  High blood pressure  causes heart disease and increases the risk of stroke. Blood pressure should be checked at least every 1 to 2 years. Ongoing high blood pressure should be treated with medicines if weight loss and exercise are not effective.  If you are 39 to 77 years old, ask your caregiver if you should take aspirin to prevent heart disease.  Diabetes screening involves taking a blood sample to check your fasting blood sugar level. This should be done once every 3 years, after age 39, if you are within normal weight and without risk factors for diabetes. Testing should be considered at a younger age or be carried out more frequently if you are overweight and have at least 1 risk factor for diabetes.  Colorectal cancer can be detected and often prevented. Most routine colorectal cancer screening begins at the age of 62 and continues through age 20. However, your caregiver may recommend screening at an earlier age if you have risk factors for colon cancer. On a yearly basis, your caregiver may provide home test kits to check for hidden blood in the stool. Use of a small camera at the end of a tube, to directly examine the colon (sigmoidoscopy or colonoscopy), can detect the earliest forms of colorectal cancer. Talk to your caregiver about this at age 78, when routine screening begins. Direct examination of the colon should be repeated every 5 to 10 years through age 67, unless early forms of pre-cancerous polyps or small growths are found.  Hepatitis C blood testing is recommended for all people born from [birth year] through 1963 and any individual with known risks for hepatitis C.  Healthy men should no longer receive prostate-specific antigen (PSA) blood tests as part of routine cancer screening. Consult with your caregiver about prostate cancer screening.  Testicular cancer screening is not recommended for adolescents or adult males who have no symptoms. Screening includes self-exam, caregiver exam, and other screening  tests. Consult with your caregiver about any symptoms you have or any concerns you have about testicular cancer.  Practice safe sex. Use condoms and avoid high-risk sexual practices to reduce the spread of sexually transmitted infections (STIs).  Use sunscreen. Apply sunscreen liberally and repeatedly throughout the day. You should seek shade when your shadow is shorter than you. Protect yourself by wearing long sleeves, pants, a wide-brimmed hat, and sunglasses year round, whenever you are outdoors.  Notify your caregiver of new moles or changes in moles, especially if there is a change in shape or color. Also notify your caregiver if a mole is larger than the size of a pencil eraser.  A one-time screening for abdominal aortic aneurysm (AAA) and surgical repair of large AAAs by sound wave imaging (ultrasonography) is recommended for ages 9 to 104 years who are current or former smokers.  Stay current with your immunizations. Document Released: 01/15/2008 Document Revised: 11/13/2012 Document Reviewed: 12/14/2010 [facility] Patient Information 2014 [place], [place]. Monitoring for Diabetes There are two blood tests that help you monitor and manage your diabetes. These include:  An A1c (hemoglobin A1c) test.  This test is an average of your glucose (or blood sugar) control over the past 3 months.  This is recommended as a way for you and your caregiver to understand how well your glucose levels are controlled on the average.  Your A1c goal will be determined by your caregiver, but it is usually best if it is less than 6.5% to 7.0%.  Glucose (sugar) attaches itself to red blood cells. The amount of glucose then can then be measured. The amount of glucose on the cells depends on how high your blood glucose has been.  SMBG test (self-monitoring blood glucose).  Using a blood glucose monitor (meter) to do SMBG testing is an easy way to monitor the amount of glucose in your blood and can help you  improve your control. The monitor will tell you what your blood glucose is at that very moment. Every person with diabetes should have a blood glucose monitor and know how to use it. The better you control your blood sugar on a daily basis, the better your A1c levels will be. HOW OFTEN SHOULD I HAVE AN A1C LEVEL?  Every 3 months if your diabetes is not well controlled or if therapy has changed.  Every 6 months if you are meeting your treatment goals. HOW OFTEN SHOULD I DO SMBG TESTING?  Your caregiver will recommend how often you should test. Testing times are based on the kind of medicine you take, type of diabetes you have, and your blood glucose control. Testing times can include:  Type 1 diabetes: test 3 or 4 times a day or as directed.  Type 2 diabetes and if you are taking insulin and diabetes pills: test 3 or 4 times a day or as directed.  If you are taking diabetes pills only and not reaching your target A1c: test 2 to 4 times a day or as directed.  If you are taking diabetes pills and are controlling your diabetes well with diet and exercise, your caregiver will help you decide what is appropriate. WHAT TIME OF DAY SHOULD I TEST?  The best time of day to test your blood glucose depends on medications, mealtimes, exercise, and blood glucose control. It is best to test at different times because this will help you know how you are doing throughout the day. Your caregiver will help you decide what is best. WHAT SHOULD MY BLOOD GLUCOSE BE? Blood glucose target goals may vary depending on each persons needs, whether they have type 1 or type 2 diabetes or what medications they are taking. However, as a general rule, blood glucose should be:  Before meals   70-130 mg/dl.  After meals    ..less than 180 mg/dl. CHECK YOUR BLOOD GLUCOSE IF:  You have symptoms of low blood sugar (hypoglycemia), which may include dizziness, shaking, sweating, chills and confusion.  You have symptoms of high  blood sugar (hyperglycemia), which may include sleepiness, blurred vision, frequent urination and excessive thirst.  You are learning how meals, physical activity and medicine affect your blood glucose level. The more you learn about how various foods, your medications, and activities affect you, the better job you will do of taking care of yourself.  You have a job in which poor control could cause safety problems while driving or operating machinery. CHECK YOUR BLOOD SUGAR MORE FREQUENTLY:  If you have medication or dietary changes.  If you begin taking other kinds of medicines.  If you become sick or your level of stress increases. With  an illness, your blood sugar may even be high without eating.  Before and after exercise. Follow your caregiver's testing recommendations during this time.  TO DISPOSE OF SHARPS: Each city or state may have different regulations. Check with your public works or Engineer, structural.  Sharps containers can be purchased from pharmacies.  Place all used sharps in a container. You do not need to replace any protective covers over the needle or break the needle.  Sharps should be contained in a ridge, leakproof, puncture-resistant container.  Plastic detergent bottle.  Bleach bottle.  When container is almost full, add a solution that is 1 part laundry bleach and 9 parts tap water (it is okay to use undiluted bleach if you wish). You may want to wear gloves since bleach can damage tissue. Let the solution sit for 30 minutes.  Carefully pour all the liquid into the sanitary sewer. Be sure to prevent the sharps from falling out.  Once liquid is drained, reseal the container with lid and tape it shut with duct tape. This will prevent the cap from coming off.  Dispose of the container with your regular household trash and waste. It is a good idea to let your trash hauler know that you will be disposing of sharps. Document Released: 07/22/2003  Document Revised: 04/12/2012 Document Reviewed: 01/20/2009 [facility] Patient Information 2014 [place], [place].

## 2013-06-22 NOTE — Assessment & Plan Note (Signed)
BP Readings from Last 3 Encounters:  06/22/13 140/72   The current medical regimen is effective;  continue present plan and medications.

## 2013-06-22 NOTE — Assessment & Plan Note (Signed)
No anginal symptoms Add ASA 81 qd Continue medical mgmt

## 2013-06-22 NOTE — Assessment & Plan Note (Signed)
On metformin and OHA for years Also statin - add asa given CAD hx Check labs, titrate as needed Refer for optho  No results found for this basename: HGBA1C

## 2013-07-10 ENCOUNTER — Encounter (INDEPENDENT_AMBULATORY_CARE_PROVIDER_SITE_OTHER): Payer: Self-pay | Admitting: Ophthalmology

## 2013-08-01 ENCOUNTER — Encounter (INDEPENDENT_AMBULATORY_CARE_PROVIDER_SITE_OTHER): Payer: Medicare Other | Admitting: Ophthalmology

## 2013-08-01 DIAGNOSIS — I10 Essential (primary) hypertension: Secondary | ICD-10-CM

## 2013-08-01 DIAGNOSIS — H35039 Hypertensive retinopathy, unspecified eye: Secondary | ICD-10-CM

## 2013-08-01 DIAGNOSIS — H43819 Vitreous degeneration, unspecified eye: Secondary | ICD-10-CM

## 2013-08-01 DIAGNOSIS — E1139 Type 2 diabetes mellitus with other diabetic ophthalmic complication: Secondary | ICD-10-CM

## 2013-08-01 DIAGNOSIS — E11319 Type 2 diabetes mellitus with unspecified diabetic retinopathy without macular edema: Secondary | ICD-10-CM

## 2013-08-01 LAB — HM DIABETES EYE EXAM

## 2013-08-07 ENCOUNTER — Encounter: Payer: Self-pay | Admitting: Internal Medicine

## 2013-08-10 ENCOUNTER — Encounter: Payer: Self-pay | Admitting: Internal Medicine

## 2013-08-28 ENCOUNTER — Other Ambulatory Visit: Payer: Self-pay | Admitting: Internal Medicine

## 2013-10-22 ENCOUNTER — Other Ambulatory Visit (INDEPENDENT_AMBULATORY_CARE_PROVIDER_SITE_OTHER): Payer: Medicare Other

## 2013-10-22 ENCOUNTER — Ambulatory Visit (INDEPENDENT_AMBULATORY_CARE_PROVIDER_SITE_OTHER): Payer: Medicare Other | Admitting: Internal Medicine

## 2013-10-22 ENCOUNTER — Encounter: Payer: Self-pay | Admitting: Internal Medicine

## 2013-10-22 VITALS — BP 140/78 | HR 72 | Temp 97.8°F | Wt 176.0 lb

## 2013-10-22 DIAGNOSIS — E785 Hyperlipidemia, unspecified: Secondary | ICD-10-CM

## 2013-10-22 DIAGNOSIS — E119 Type 2 diabetes mellitus without complications: Secondary | ICD-10-CM

## 2013-10-22 DIAGNOSIS — I10 Essential (primary) hypertension: Secondary | ICD-10-CM

## 2013-10-22 DIAGNOSIS — Z23 Encounter for immunization: Secondary | ICD-10-CM

## 2013-10-22 LAB — HEMOGLOBIN A1C: Hgb A1c MFr Bld: 6.9 % — ABNORMAL HIGH (ref 4.6–6.5)

## 2013-10-22 MED ORDER — LOSARTAN POTASSIUM 25 MG PO TABS: 25.0000 mg | ORAL_TABLET | Freq: Every day | ORAL | Status: DC

## 2013-10-22 NOTE — Assessment & Plan Note (Signed)
On metformin and OHA for years Also statin -  added asa 81 given CAD hx (06/2013) Add ARB now (losaratan 25mg  qd) Check labs q64mo, titrate as needed follow up optho annually  Lab Results  Component Value Date   HGBA1C 7.3* 06/22/2013

## 2013-10-22 NOTE — Assessment & Plan Note (Signed)
On statin - Check lipids annually, titrate as needed The current medical regimen is effective;  continue present plan and medications.   

## 2013-10-22 NOTE — Progress Notes (Signed)
Subjective:    Patient ID: [name], male    DOB: July 21, 1922, 80 y.o.   MRN: [id number]  HPI  Patient here today for follow-up.  Chronic medical positions reviewed.   Diabetes - maintained on Metformin.  Reports compliance with current therapy.  No adverse events.  Eye exam done in the last 6 months (matthews). Does not check blood sugars at home.   HTN - reports compliance with current medications.  No CV symptoms.    Hyperlipidemia - on statin with known CAD.  Reports compliance with current therapy.  No adverse reactions noted.   CAD - DES to LAD 06/2013 by Dr Tamala Julian.  No recent cardiology follow up.  No CV symptoms.  On ASA daily.   Past Medical History  Diagnosis Date  . Arthritis   . Type 2 diabetes mellitus   . Depression   . Dyslipidemia   . Hypertension   . CAD (coronary artery disease)     s/p DES to LAD 06/2013  . Glaucoma      Review of Systems  Constitutional: Negative for fever, chills, activity change and appetite change.  HENT: Positive for postnasal drip. Negative for congestion, rhinorrhea and sinus pressure.   Respiratory: Negative for shortness of breath and wheezing.   Cardiovascular: Negative for chest pain, palpitations and leg swelling.  Gastrointestinal: Negative for nausea, vomiting, diarrhea and constipation.  Skin: Negative for rash and wound.  Neurological: Negative for dizziness, syncope, weakness, numbness and headaches.       Objective:   Physical Exam  Vitals reviewed. Constitutional: He is oriented to person, place, and time. He appears well-developed and well-nourished. No distress.  Neck: Normal range of motion. Neck supple. No thyromegaly present.  Cardiovascular: Normal rate, regular rhythm and normal heart sounds.   No murmur heard. Pulmonary/Chest: Effort normal and breath sounds normal. No respiratory distress. He has no wheezes.  Abdominal: Soft. Bowel sounds are normal. He exhibits no distension. There is no rebound and  no guarding.  Musculoskeletal: He exhibits no edema and no tenderness.  Decreased bilateral upper extremity extension range of motion, otherwise normal  Lymphadenopathy:    He has no cervical adenopathy.  Neurological: He is alert and oriented to person, place, and time.  Skin: Skin is warm and dry. No rash noted. He is not diaphoretic.  Psychiatric: He has a normal mood and affect. His behavior is normal. Judgment and thought content normal.    Wt Readings from Last 3 Encounters:  10/22/13 176 lb (79.833 kg)  06/22/13 182 lb 6.4 oz (82.736 kg)   BP Readings from Last 3 Encounters:  10/22/13 140/78  06/22/13 140/72   Lab Results  Component Value Date   GLUCOSE 184* 06/22/2013   CHOL 159 06/22/2013   TRIG 150.0* 06/22/2013   HDL 38.10* 06/22/2013   LDLCALC 91 06/22/2013   ALT 21 08/21/2012   AST 29 08/21/2012   NA 139 06/22/2013   K 4.3 06/22/2013   CL 99 06/22/2013   CREATININE 1.1 06/22/2013   BUN 11 06/22/2013   CO2 34* 06/22/2013   HGBA1C 7.3* 06/22/2013   MICROALBUR 8.5* 06/22/2013       Assessment & Plan:   Problem List Items Addressed This Visit   Dyslipidemia     On statin - Check lipids annually, titrate as needed The current medical regimen is effective;  continue present plan and medications.      Hypertension      BP Readings from Last 3 Encounters:  10/22/13 140/78  06/22/13 140/72  add low dose ARB now given co-dx DM2 The current medical regimen is effective;  continue present plan and medications.     Relevant Medications      losartan (COZAAR) tablet   Type 2 diabetes mellitus - Primary      On metformin and OHA for years Also statin -  added asa 81 given CAD hx (06/2013) Add ARB now (losaratan 25mg  qd) Check labs q54mo, titrate as needed follow up optho annually  Lab Results  Component Value Date   HGBA1C 7.3* 06/22/2013      Relevant Orders      Hemoglobin A1c

## 2013-10-22 NOTE — Progress Notes (Signed)
Pre visit review using our clinic review tool, if applicable. No additional management support is needed unless otherwise documented below in the visit note. 

## 2013-10-22 NOTE — Assessment & Plan Note (Signed)
BP Readings from Last 3 Encounters:  10/22/13 140/78  06/22/13 140/72  add low dose ARB now given co-dx DM2 The current medical regimen is effective;  continue present plan and medications.

## 2013-10-22 NOTE — Patient Instructions (Addendum)
It was good to see you today.  We have reviewed your prior records including labs and tests today  Health Maintenance reviewed - tetanus updated today -all other recommended immunizations and age-appropriate screenings are up-to-date.  Test(s) ordered today. Your results will be released to [place] (or called to you) after review, usually within 72hours after test completion. If any changes need to be made, you will be notified at that same time.  Medications reviewed and updated Begin low dose losartan 25 mg once daily to protect kidneys from diabetes, reduce blood pressure and heart protection  Your prescription(s) have been submitted to your pharmacy. Please take as directed and contact our office if you believe you are having problem(s) with the medication(s).  Tylenol arthritis okay for arthritis symptoms as needed. Take 500 mg every 6 hours as needed  Please schedule followup in 6 months, call sooner if problems.  Diabetes and Standards of Medical Care  Diabetes is complicated. You may find that your diabetes team includes a dietitian, nurse, diabetes educator, eye doctor, and more. To help everyone know what is going on and to help you get the care you deserve, the following schedule of care was developed to help keep you on track. Below are the tests, exams, vaccines, medicines, education, and plans you will need. HbA1c test This test shows how well you have controlled your glucose over the past 2 3 months. It is used to see if your diabetes management plan needs to be adjusted.   It is performed at least 2 times a year if you are meeting treatment goals.  It is performed 4 times a year if therapy has changed or if you are not meeting treatment goals. Blood pressure test  This test is performed at every routine medical visit. The goal is less than 140/90 mmHg for most people, but 130/80 mmHg in some cases. Ask your health care provider about your goal. Dental exam  Follow up  with the dentist regularly. Eye exam  If you are diagnosed with type 1 diabetes as a child, get an exam upon reaching the age of 17 years or older and have had diabetes for 3 5 years. Yearly eye exams are recommended after that initial eye exam.  If you are diagnosed with type 1 diabetes as an adult, get an exam within 5 years of diagnosis and then yearly.  If you are diagnosed with type 2 diabetes, get an exam as soon as possible after the diagnosis and then yearly. Foot care exam  Visual foot exams are performed at every routine medical visit. The exams check for cuts, injuries, or other problems with the feet.  A comprehensive foot exam should be done yearly. This includes visual inspection as well as assessing foot pulses and testing for loss of sensation.  Check your feet nightly for cuts, injuries, or other problems with your feet. Tell your health care provider if anything is not healing. Kidney function test (urine microalbumin)  This test is performed once a year.  Type 1 diabetes: The first test is performed 5 years after diagnosis.  Type 2 diabetes: The first test is performed at the time of diagnosis.  A serum creatinine and estimated glomerular filtration rate (eGFR) test is done once a year to assess the level of chronic kidney disease (CKD), if present. Lipid profile (cholesterol, HDL, LDL, triglycerides)  Performed every 5 years for most people.  The goal for LDL is less than 100 mg/dL. If you are at high  risk, the goal is less than 70 mg/dL.  The goal for HDL is 40 mg/dL 50 mg/dL for men and 50 mg/dL 60 mg/dL for women. An HDL cholesterol of 60 mg/dL or higher gives some protection against heart disease.  The goal for triglycerides is less than 150 mg/dL. Influenza vaccine, pneumococcal vaccine, and hepatitis B vaccine  The influenza vaccine is recommended yearly.  The pneumococcal vaccine is generally given once in a lifetime. However, there are some instances  when another vaccination is recommended. Check with your health care provider.  The hepatitis B vaccine is also recommended for adults with diabetes. Diabetes self-management education  Education is recommended at diagnosis and ongoing as needed. Treatment plan  Your treatment plan is reviewed at every medical visit. Document Released: 05/16/2009 Document Revised: 03/21/2013 Document Reviewed: 12/19/2012 [facility] Patient Information 2014 [place].

## 2013-11-01 ENCOUNTER — Other Ambulatory Visit: Payer: Self-pay | Admitting: Internal Medicine

## 2013-11-28 ENCOUNTER — Other Ambulatory Visit: Payer: Self-pay | Admitting: Internal Medicine

## 2013-12-13 ENCOUNTER — Other Ambulatory Visit: Payer: Self-pay | Admitting: *Deleted

## 2013-12-13 MED ORDER — PRODIGY AUTOCODE BLOOD GLUCOSE W/DEVICE KIT: PACK | Status: DC

## 2013-12-13 MED ORDER — GLYBURIDE-METFORMIN 5-500 MG PO TABS: ORAL_TABLET | ORAL | Status: DC

## 2013-12-13 MED ORDER — HYDROCHLOROTHIAZIDE 25 MG PO TABS: ORAL_TABLET | ORAL | Status: DC

## 2013-12-13 MED ORDER — PRODIGY TWIST TOP LANCETS 28G MISC: 1.0000 | Freq: Every day | Status: DC

## 2013-12-13 MED ORDER — LOSARTAN POTASSIUM 25 MG PO TABS: 25.0000 mg | ORAL_TABLET | Freq: Every day | ORAL | Status: DC

## 2013-12-13 MED ORDER — SIMVASTATIN 20 MG PO TABS: ORAL_TABLET | ORAL | Status: DC

## 2014-03-05 ENCOUNTER — Ambulatory Visit: Payer: Medicare Other | Admitting: Internal Medicine

## 2014-04-17 ENCOUNTER — Ambulatory Visit: Payer: Medicare Other | Admitting: Family

## 2014-04-17 ENCOUNTER — Observation Stay (HOSPITAL_COMMUNITY)
Admission: EM | Admit: 2014-04-17 | Discharge: 2014-04-19 | Disposition: A | Discharge status: Home / Self Care | Payer: Medicare HMO | Attending: Family Medicine | Admitting: Family Medicine

## 2014-04-17 ENCOUNTER — Encounter (HOSPITAL_COMMUNITY): Payer: Self-pay | Admitting: Emergency Medicine

## 2014-04-17 ENCOUNTER — Observation Stay (HOSPITAL_COMMUNITY): Payer: Medicare HMO

## 2014-04-17 ENCOUNTER — Telehealth: Payer: Self-pay | Admitting: Internal Medicine

## 2014-04-17 DIAGNOSIS — E785 Hyperlipidemia, unspecified: Secondary | ICD-10-CM | POA: Diagnosis not present

## 2014-04-17 DIAGNOSIS — Z9119 Patient's noncompliance with other medical treatment and regimen: Secondary | ICD-10-CM | POA: Diagnosis not present

## 2014-04-17 DIAGNOSIS — R739 Hyperglycemia, unspecified: Secondary | ICD-10-CM | POA: Diagnosis present

## 2014-04-17 DIAGNOSIS — N289 Disorder of kidney and ureter, unspecified: Secondary | ICD-10-CM

## 2014-04-17 DIAGNOSIS — N179 Acute kidney failure, unspecified: Secondary | ICD-10-CM | POA: Diagnosis not present

## 2014-04-17 DIAGNOSIS — F329 Major depressive disorder, single episode, unspecified: Secondary | ICD-10-CM | POA: Diagnosis not present

## 2014-04-17 DIAGNOSIS — I10 Essential (primary) hypertension: Secondary | ICD-10-CM | POA: Insufficient documentation

## 2014-04-17 DIAGNOSIS — Z79899 Other long term (current) drug therapy: Secondary | ICD-10-CM | POA: Diagnosis not present

## 2014-04-17 DIAGNOSIS — Z803 Family history of malignant neoplasm of breast: Secondary | ICD-10-CM | POA: Insufficient documentation

## 2014-04-17 DIAGNOSIS — I2510 Atherosclerotic heart disease of native coronary artery without angina pectoris: Secondary | ICD-10-CM | POA: Insufficient documentation

## 2014-04-17 DIAGNOSIS — R531 Weakness: Secondary | ICD-10-CM

## 2014-04-17 DIAGNOSIS — Z87891 Personal history of nicotine dependence: Secondary | ICD-10-CM | POA: Diagnosis not present

## 2014-04-17 DIAGNOSIS — Z7982 Long term (current) use of aspirin: Secondary | ICD-10-CM | POA: Diagnosis not present

## 2014-04-17 DIAGNOSIS — Z91199 Patient's noncompliance with other medical treatment and regimen due to unspecified reason: Secondary | ICD-10-CM | POA: Insufficient documentation

## 2014-04-17 DIAGNOSIS — H409 Unspecified glaucoma: Secondary | ICD-10-CM

## 2014-04-17 DIAGNOSIS — E119 Type 2 diabetes mellitus without complications: Secondary | ICD-10-CM | POA: Diagnosis not present

## 2014-04-17 DIAGNOSIS — E860 Dehydration: Secondary | ICD-10-CM | POA: Diagnosis present

## 2014-04-17 DIAGNOSIS — F3289 Other specified depressive episodes: Secondary | ICD-10-CM | POA: Insufficient documentation

## 2014-04-17 DIAGNOSIS — E1165 Type 2 diabetes mellitus with hyperglycemia: Secondary | ICD-10-CM

## 2014-04-17 DIAGNOSIS — M1990 Unspecified osteoarthritis, unspecified site: Secondary | ICD-10-CM

## 2014-04-17 HISTORY — DX: Hyperglycemia, unspecified: R73.9

## 2014-04-17 LAB — CBC
HCT: 37.1 % — ABNORMAL LOW (ref 39.0–52.0)
HCT: 35.2 % — ABNORMAL LOW (ref 39.0–52.0)
HEMOGLOBIN: 13 g/dL (ref 13.0–17.0)
Hemoglobin: 12.5 g/dL — ABNORMAL LOW (ref 13.0–17.0)
MCH: 30 pg (ref 26.0–34.0)
MCH: 30.3 pg (ref 26.0–34.0)
MCHC: 35 g/dL (ref 30.0–36.0)
MCHC: 35.5 g/dL (ref 30.0–36.0)
MCV: 85.5 fL (ref 78.0–100.0)
MCV: 85.2 fL (ref 78.0–100.0)
PLATELETS: 153 10*3/uL (ref 150–400)
Platelets: 186 10*3/uL (ref 150–400)
RBC: 4.34 MIL/uL (ref 4.22–5.81)
RBC: 4.13 MIL/uL — ABNORMAL LOW (ref 4.22–5.81)
RDW: 11.7 % (ref 11.5–15.5)
RDW: 11.7 % (ref 11.5–15.5)
WBC: 4.5 10*3/uL (ref 4.0–10.5)
WBC: 3.7 10*3/uL — ABNORMAL LOW (ref 4.0–10.5)

## 2014-04-17 LAB — COMPREHENSIVE METABOLIC PANEL
ALBUMIN: 3.8 g/dL (ref 3.5–5.2)
ALT: 23 U/L (ref 0–53)
AST: 28 U/L (ref 0–37)
Alkaline Phosphatase: 65 U/L (ref 39–117)
Anion gap: 14 (ref 5–15)
BUN: 41 mg/dL — ABNORMAL HIGH (ref 6–23)
CALCIUM: 10.4 mg/dL (ref 8.4–10.5)
CO2: 28 mEq/L (ref 19–32)
Chloride: 84 mEq/L — ABNORMAL LOW (ref 96–112)
Creatinine, Ser: 1.48 mg/dL — ABNORMAL HIGH (ref 0.50–1.35)
GFR calc non Af Amer: 40 mL/min — ABNORMAL LOW (ref >90)
GFR, EST AFRICAN AMERICAN: 46 mL/min — AB (ref >90)
GLUCOSE: 497 mg/dL — AB (ref 70–99)
POTASSIUM: 4.7 meq/L (ref 3.7–5.3)
Sodium: 126 mEq/L — ABNORMAL LOW (ref 137–147)
TOTAL PROTEIN: 7 g/dL (ref 6.0–8.3)
Total Bilirubin: 0.5 mg/dL (ref 0.3–1.2)

## 2014-04-17 LAB — URINALYSIS, ROUTINE W REFLEX MICROSCOPIC
Bilirubin Urine: NEGATIVE
Glucose, UA: >1000 mg/dL — AB
Hgb urine dipstick: NEGATIVE
Ketones, ur: NEGATIVE mg/dL
Leukocytes, UA: NEGATIVE
NITRITE: NEGATIVE
PH: 5.5 (ref 5.0–8.0)
Protein, ur: NEGATIVE mg/dL
SPECIFIC GRAVITY, URINE: 1.022 (ref 1.005–1.030)
Urobilinogen, UA: 0.2 mg/dL (ref 0.0–1.0)

## 2014-04-17 LAB — URINE MICROSCOPIC-ADD ON

## 2014-04-17 LAB — CREATININE, SERUM
Creatinine, Ser: 1.35 mg/dL (ref 0.50–1.35)
GFR calc Af Amer: 52 mL/min — ABNORMAL LOW (ref >90)
GFR calc non Af Amer: 45 mL/min — ABNORMAL LOW (ref >90)

## 2014-04-17 LAB — GLUCOSE, CAPILLARY: GLUCOSE-CAPILLARY: 257 mg/dL — AB (ref 70–99)

## 2014-04-17 LAB — I-STAT TROPONIN, ED: TROPONIN I, POC: 0.01 ng/mL (ref 0.00–0.08)

## 2014-04-17 LAB — CBG MONITORING, ED: GLUCOSE-CAPILLARY: 487 mg/dL — AB (ref 70–99)

## 2014-04-17 MED ORDER — SIMVASTATIN 20 MG PO TABS
20.0000 mg | ORAL_TABLET | Freq: Every day | ORAL | Status: DC
  Administered 2014-04-17 – 2014-04-19 (×3): 20 mg via ORAL
  Filled 2014-04-17 (×3): qty 1

## 2014-04-17 MED ORDER — SODIUM CHLORIDE 0.9 % IV SOLN
INTRAVENOUS | Status: DC
  Administered 2014-04-18 – 2014-04-19 (×2): via INTRAVENOUS

## 2014-04-17 MED ORDER — ASPIRIN EC 81 MG PO TBEC
81.0000 mg | DELAYED_RELEASE_TABLET | Freq: Every day | ORAL | Status: DC
  Administered 2014-04-17 – 2014-04-19 (×3): 81 mg via ORAL
  Filled 2014-04-17 (×3): qty 1

## 2014-04-17 MED ORDER — SODIUM CHLORIDE 0.9 % IV SOLN
INTRAVENOUS | Status: DC
  Administered 2014-04-17 – 2014-04-18 (×2): via INTRAVENOUS

## 2014-04-17 MED ORDER — ADULT MULTIVITAMIN W/MINERALS CH
1.0000 | ORAL_TABLET | Freq: Every day | ORAL | Status: DC
  Administered 2014-04-18 – 2014-04-19 (×3): 1 via ORAL
  Filled 2014-04-17 (×3): qty 1

## 2014-04-17 MED ORDER — HEPARIN SODIUM (PORCINE) 5000 UNIT/ML IJ SOLN
5000.0000 [IU] | Freq: Three times a day (TID) | INTRAMUSCULAR | Status: DC
  Administered 2014-04-18 – 2014-04-19 (×5): 5000 [IU] via SUBCUTANEOUS
  Filled 2014-04-17 (×8): qty 1

## 2014-04-17 MED ORDER — INSULIN ASPART 100 UNIT/ML ~~LOC~~ SOLN
0.0000 [IU] | SUBCUTANEOUS | Status: DC
  Administered 2014-04-17: 9 [IU] via SUBCUTANEOUS
  Administered 2014-04-18: 2 [IU] via SUBCUTANEOUS
  Administered 2014-04-18: 5 [IU] via SUBCUTANEOUS
  Administered 2014-04-18: 2 [IU] via SUBCUTANEOUS
  Administered 2014-04-18: 3 [IU] via SUBCUTANEOUS
  Administered 2014-04-18: 5 [IU] via SUBCUTANEOUS
  Administered 2014-04-19: 1 [IU] via SUBCUTANEOUS
  Administered 2014-04-19 (×2): 2 [IU] via SUBCUTANEOUS
  Filled 2014-04-17: qty 1

## 2014-04-17 MED ORDER — SODIUM CHLORIDE 0.9 % IV BOLUS (SEPSIS)
500.0000 mL | Freq: Once | INTRAVENOUS | Status: AC
  Administered 2014-04-17: 500 mL via INTRAVENOUS

## 2014-04-17 NOTE — ED Notes (Signed)
Pt here for high blood sugar, pt daughter reports confusion and falls as well. Pt tearful and crying, states he is scared and does not want to be admitted.

## 2014-04-17 NOTE — ED Notes (Signed)
Attempted report 

## 2014-04-17 NOTE — Telephone Encounter (Signed)
Patient Information:  Caller Name: [name]  Phone: [phone]  Patient: [name]  Gender: Male  DOB: 1921/09/25  Age: 80 Years  PCP: Gwendolyn Grant (Adults only)  Office Follow Up:  Does the office need to follow up with this patient?: No  Instructions For The Office: N/A   Symptoms  Reason For Call & Symptoms: Daughter calling about [phone] [id number].  Sometimes the meter reads HI.  Onset 04/03/14.  He seems weak, staying in bed longer than usual.  He fell 04/16/14 and the previous week.  She relates he is very weak, seems off balance.  He blames his fall on bedroom shoes slipping on floor.  Go to ED Now or to Office with PCP Approval per Diabetes - High Blood Sugar guideline due to Blood glucose > 500 mg/dl. Spoke with Steffanie at office and was instructed to send him to ED.  Caller instructed to go to [facility] ED.  Reviewed Health History In EMR: Yes  Reviewed Medications In EMR: Yes  Reviewed Allergies In EMR: Yes  Reviewed Surgeries / Procedures: Yes  Date of Onset of Symptoms: 04/03/2014  Treatments Tried: increased water intake, making sure he took meds as ordered  Treatments Tried Worked: No  Guideline(s) Used:  Diabetes - High Blood Sugar  Disposition Per Guideline:   Go to ED Now (or to Office with PCP Approval)  Reason For Disposition Reached:   Blood glucose > 500 mg/dl (27.5 mmol/l)  Advice Given:  Treatment - Liquids  Drink at least one glass (8 oz or 240 ml) of water per hour for the next 4 hours. (Reason: adequate hydration will reduce hyperglycemia).  Treatment - Diabetes Medications  : Continue taking your diabetes pills.  Call Back If:  Rapid breathing occurs  You become worse.  RN Overrode Recommendation:  Go To ED  [facility] per instruction from [place] and office preference.

## 2014-04-17 NOTE — H&P (Signed)
Hospitalist Admission History and Physical  Patient name: [name] Medical record number: [id number] Date of birth: 1921-12-14 Age: 80 y.o. Gender: male  Primary Care Provider: Gwendolyn Grant, MD  Chief Complaint: hyperglycemia, dehydration  History of Present Illness:This is a 80 y.o. year old male with significant past medical history of type 2 DM-non compliant, HTN, HLD, CAD s/p stent 06/2013 presenting with hyperglycemia, dehydration. Per the daughter, pt has been doing  Excessive amount of manual labor outdoors over the past month. Pt has not been compliant with diabetic medications or adequate fluid intake per daughter. Has had some falls at home. Pt denies any prodrome prior to falls. Believes he slipped in yard while doing yard work. No fevers or chills. Went to PCPs office for check today, where pt was noted to have blood sugar > 500. Was directed to come to ER for further evaluation.  On presentation, hemodynamically stable. Afebrile. Satting 98% on RA. WBC 4.5, hgb 13, Cr 1.48, Glu 497. Corrected sodium 135. Trop neg x1. UA w/ >1k glycose, but negative for infection. Head CT, CXR pending.   Assessment and Plan: [name] is a 80 y.o. year old male presenting with hyperglycemia, AKI, dehydration   Active Problems:   Hyperglycemia   AKI (acute kidney injury)   Dehydration   1-Hyperglycemia/Dehdyration -Hydrate pt  -Start on SSI  -No AGMA, or ketonuria  -A1C  2-AKI  -Baseline Cr around 1  -suspect prerenal etiology -BUN:Cr ration 20:1 -Hydrate pt  -hold offending meds   3-HTN/CAD  -BP stable  -no active CP  -cont ASA  -hold diuretic and ARB in setting of #2  FEN/GI: heart healthy/carb modified diet  Prophylaxis: sub q heparni  Disposition: pending further evaluation  Code Status:Full Code    Patient Active Problem List   Diagnosis Date Noted  . Hyperglycemia 04/17/2014  . Type 2 diabetes mellitus   . Dyslipidemia   . Hypertension   . CAD  (coronary artery disease)   . Arthritis   . Glaucoma    Past Medical History: Past Medical History  Diagnosis Date  . Arthritis   . Type 2 diabetes mellitus   . Depression   . Dyslipidemia   . Hypertension   . CAD (coronary artery disease)     s/p DES to LAD 06/2013  . Glaucoma     Past Surgical History: Past Surgical History  Procedure Laterality Date  . Cardiac catheterization  06/2013    DES to LAD by Dr Tamala Julian  . Right cataract      Social History: History   Social History  . Marital Status: Widowed    Spouse Name: N/A    Number of Children: N/A  . Years of Education: N/A   Social History Main Topics  . Smoking status: Former Smoker    Types: Cigarettes    Quit date: 08/02/1950  . Smokeless tobacco: None  . Alcohol Use: None  . Drug Use: None  . Sexual Activity: None   Other Topics Concern  . None   Social History Narrative   Retired Warden/ranger   widowed, lives with dtr and youngest son   5 kids from first marriage    Family History: Family History  Problem Relation Age of Onset  . Arthritis Mother   . Breast cancer Mother   . Arthritis Father   . Diabetes Other   . Arthritis Other   . Colon cancer Other   . Stroke Other     Allergies: No  Known Allergies  Current Facility-Administered Medications  Medication Dose Route Frequency Provider Last Rate Last Dose  . 0.9 %  sodium chloride infusion   Intravenous Continuous Charlesetta Shanks, MD 125 mL/hr at 04/17/14 1939    . 0.9 %  sodium chloride infusion   Intravenous Continuous Shanda Howells, MD      . heparin injection 5,000 Units  5,000 Units Subcutaneous 3 times per day Shanda Howells, MD      . insulin aspart (novoLOG) injection 0-9 Units  0-9 Units Subcutaneous 6 times per day Shanda Howells, MD       Current Outpatient Prescriptions  Medication Sig Dispense Refill  . aspirin EC 81 MG tablet Take 1 tablet (81 mg total) by mouth daily.  150 tablet  2  . glyBURIDE-metformin (GLUCOVANCE)  5-500 MG per tablet Take 1 tablet by mouth 2 (two) times daily with a meal. TAKE ONE TABLET BY MOUTH TWICE DAILY FOR DIABETES      . hydrochlorothiazide (HYDRODIURIL) 25 MG tablet Take 25 mg by mouth daily.      . Loratadine 10 MG CAPS Take 1 capsule by mouth daily.      [name] losartan (COZAAR) 25 MG tablet Take 25 mg by mouth daily.      . Multiple Vitamin (MULTIVITAMIN WITH MINERALS) TABS tablet Take 1 tablet by mouth daily.      . simvastatin (ZOCOR) 20 MG tablet Take 20 mg by mouth daily.      . Blood Glucose Monitoring Suppl (PRODIGY AUTOCODE BLOOD GLUCOSE) W/DEVICE KIT Use as directed  1 each  0  . PRODIGY TWIST TOP LANCETS 28G MISC 1 each by Does not apply route daily.  1 each  2   Review Of Systems: 12 point ROS negative except as noted above in HPI.  Physical Exam: Filed Vitals:   04/17/14 1900  BP: 139/74  Pulse: 76  Temp:   Resp:     General: alert and cooperative HEENT: PERRLA and extra ocular movement intact Heart: S1, S2 normal, no murmur, rub or gallop, regular rate and rhythm Lungs: clear to auscultation, no wheezes or rales and unlabored breathing Abdomen: abdomen is soft without significant tenderness, masses, organomegaly or guarding Extremities: extremities normal, atraumatic, no cyanosis or edema Skin:no rashes, no ecchymoses Neurology: normal without focal findings  Labs and Imaging: Lab Results  Component Value Date/Time   NA 126* 04/17/2014  2:31 PM   NA 141 08/21/2012   K 4.7 04/17/2014  2:31 PM   CL 84* 04/17/2014  2:31 PM   CO2 28 04/17/2014  2:31 PM   BUN 41* 04/17/2014  2:31 PM   BUN 12 08/21/2012   CREATININE 1.48* 04/17/2014  2:31 PM   CREATININE 1.2 08/21/2012   GLUCOSE 497* 04/17/2014  2:31 PM   Lab Results  Component Value Date   WBC 4.5 04/17/2014   HGB 13.0 04/17/2014   HCT 37.1* 04/17/2014   MCV 85.5 04/17/2014   PLT 186 04/17/2014   Urinalysis    Component Value Date/Time   COLORURINE YELLOW 04/17/2014 1756   APPEARANCEUR CLEAR 04/17/2014 1756    LABSPEC 1.022 04/17/2014 1756   PHURINE 5.5 04/17/2014 1756   GLUCOSEU >1000* 04/17/2014 1756   HGBUR NEGATIVE 04/17/2014 1756   BILIRUBINUR NEGATIVE 04/17/2014 1756   KETONESUR NEGATIVE 04/17/2014 1756   PROTEINUR NEGATIVE 04/17/2014 1756   UROBILINOGEN 0.2 04/17/2014 1756   NITRITE NEGATIVE 04/17/2014 1756   LEUKOCYTESUR NEGATIVE 04/17/2014 1756       No results found.  Shanda Howells MD  Pager: [phone]

## 2014-04-17 NOTE — ED Provider Notes (Signed)
CSN: [id number]     Arrival date & time 04/17/14  1408 History   First MD Initiated Contact with Patient 04/17/14 1905     Chief Complaint  Patient presents with  . Hyperglycemia  . Altered Mental Status     (Consider location/radiation/quality/duration/timing/severity/associated sxs/prior Treatment) HPI The patient's daughter has been noticing incremental symptoms developing for a weeks duration. She has noticed that he's been seeming mildly confused and disoriented. Also he's been exhibiting some generalized weakness with decreased activity level in some gait incoordination. She reports that he's had 2 falls over the course the past week. The first fall occurred on Saturday about 4 days ago. The second fall was yesterday. She reports he didn't hit his head he fell forward in the house and caught himself on his hands and knees. The patient doesn't have many specific complaints. He reports that he hasn't been following his daughters instructions and hasn't been drinking enough water and spending too much time outside in the sun.  Past Medical History  Diagnosis Date  . Arthritis   . Type 2 diabetes mellitus   . Depression   . Dyslipidemia   . Hypertension   . CAD (coronary artery disease)     s/p DES to LAD 06/2013  . Glaucoma    Past Surgical History  Procedure Laterality Date  . Cardiac catheterization  06/2013    DES to LAD by Dr Tamala Julian  . Right cataract     Family History  Problem Relation Age of Onset  . Arthritis Mother   . Breast cancer Mother   . Arthritis Father   . Diabetes Other   . Arthritis Other   . Colon cancer Other   . Stroke Other    History  Substance Use Topics  . Smoking status: Former Smoker    Types: Cigarettes    Quit date: 08/02/1950  . Smokeless tobacco: Not on file  . Alcohol Use: Not on file    Review of Systems 10 Systems reviewed and are negative for acute change except as noted in the HPI.    Allergies  Review of patient's  allergies indicates no known allergies.  Home Medications   Prior to Admission medications   Medication Sig Start Date End Date Taking? Authorizing Provider  aspirin EC 81 MG tablet Take 1 tablet (81 mg total) by mouth daily. 06/22/13  Yes Rowe Clack, MD  glyBURIDE-metformin (GLUCOVANCE) 5-500 MG per tablet Take 1 tablet by mouth 2 (two) times daily with a meal. TAKE ONE TABLET BY MOUTH TWICE DAILY FOR DIABETES 12/13/13  Yes Rowe Clack, MD  hydrochlorothiazide (HYDRODIURIL) 25 MG tablet Take 25 mg by mouth daily. 12/13/13  Yes Rowe Clack, MD  Loratadine 10 MG CAPS Take 1 capsule by mouth daily.   Yes Historical Provider, MD  losartan (COZAAR) 25 MG tablet Take 25 mg by mouth daily. 12/13/13  Yes Rowe Clack, MD  Multiple Vitamin (MULTIVITAMIN WITH MINERALS) TABS tablet Take 1 tablet by mouth daily.   Yes Historical Provider, MD  simvastatin (ZOCOR) 20 MG tablet Take 20 mg by mouth daily. 12/13/13  Yes Rowe Clack, MD  Blood Glucose Monitoring Suppl (PRODIGY AUTOCODE BLOOD GLUCOSE) W/DEVICE KIT Use as directed 12/13/13   Hendricks Limes, MD  PRODIGY TWIST TOP LANCETS 28G MISC 1 each by Does not apply route daily. 12/13/13   Hendricks Limes, MD   BP 133/67  Pulse 69  Temp(Src) 97.6 F (36.4 C) (Oral)  Resp  16  SpO2 100% Physical Exam  Constitutional: He is oriented to person, place, and time. He appears well-developed and well-nourished.  HENT:  Head: Normocephalic and atraumatic.  Eyes: EOM are normal. Pupils are equal, round, and reactive to light.  Neck: Neck supple.  Cardiovascular: Normal rate, regular rhythm, normal heart sounds and intact distal pulses.   Pulmonary/Chest: Effort normal and breath sounds normal.  Abdominal: Soft. Bowel sounds are normal. He exhibits no distension. There is no tenderness.  Musculoskeletal: Normal range of motion. He exhibits no edema.  Neurological: He is alert and oriented to person, place, and time. He has normal  strength. Coordination normal. GCS eye subscore is 4. GCS verbal subscore is 5. GCS motor subscore is 6.  Skin: Skin is warm, dry and intact.  Psychiatric: He has a normal mood and affect.    ED Course  Procedures (including critical care time) Labs Review Labs Reviewed  CBC - Abnormal; Notable for the following:    HCT 37.1 (*)    All other components within normal limits  COMPREHENSIVE METABOLIC PANEL - Abnormal; Notable for the following:    Sodium 126 (*)    Chloride 84 (*)    Glucose, Bld 497 (*)    BUN 41 (*)    Creatinine, Ser 1.48 (*)    GFR calc non Af Amer 40 (*)    GFR calc Af Amer 46 (*)    All other components within normal limits  URINALYSIS, ROUTINE W REFLEX MICROSCOPIC - Abnormal; Notable for the following:    Glucose, UA >1000 (*)    All other components within normal limits  CBG MONITORING, ED - Abnormal; Notable for the following:    Glucose-Capillary 487 (*)    All other components within normal limits  URINE MICROSCOPIC-ADD ON  I-STAT TROPOININ, ED    Imaging Review No results found.   EKG Interpretation   Date/Time:  Wednesday April 17 2014 14:34:38 EDT Ventricular Rate:  83 PR Interval:  146 QRS Duration: 82 QT Interval:  338 QTC Calculation: 397 R Axis:   38 Text Interpretation:  Sinus rhythm with Premature atrial complexes Left  ventricular hypertrophy with repolarization abnormality Abnormal ECG No  significant change since previous Confirmed by Johnney Killian, MD, Jeannie Done [phone])  on 04/17/2014 7:34:06 PM     CRITICAL CARE Performed by: Charlesetta Shanks   Total critical care time: 30  Critical care time was exclusive of separately billable procedures and treating other patients.  Critical care was necessary to treat or prevent imminent or life-threatening deterioration.  Critical care was time spent personally by me on the following activities: development of treatment plan with patient and/or surrogate as well as nursing, discussions  with consultants, evaluation of patient's response to treatment, examination of patient, obtaining history from patient or surrogate, ordering and performing treatments and interventions, ordering and review of laboratory studies, ordering and review of radiographic studies, pulse oximetry and re-evaluation of patient's condition.  MDM   Final diagnoses:  Acute renal insufficiency  Dehydration  Weakness generalized  Hyperglycemia due to type 2 diabetes mellitus    The patient presents as outlined above with increasing weakness and some degree of confusion developing incrementally. With review of patient's diagnostic study this appears to be secondary to renal insufficiency/dehydration with hyperglycemia. The patient has a pseudohyponatremia. CT head will be added and reviewed for the history of falls to rule out traumatic injury. At this point in time however his mental status is awake alert and with a  nonfocal neurologic examination.    Charlesetta Shanks, MD 04/17/14 [id number]

## 2014-04-17 NOTE — ED Notes (Addendum)
Per family pt having high cbgs for over a week, having confusion and frequent falls. Pt ambulatory at triage, refused wheelchair. No acute distress noted at this time. cbg 487

## 2014-04-18 ENCOUNTER — Encounter (HOSPITAL_COMMUNITY): Payer: Self-pay | Admitting: General Practice

## 2014-04-18 LAB — GLUCOSE, CAPILLARY
GLUCOSE-CAPILLARY: 82 mg/dL (ref 70–99)
GLUCOSE-CAPILLARY: 148 mg/dL — AB (ref 70–99)
GLUCOSE-CAPILLARY: 292 mg/dL — AB (ref 70–99)
Glucose-Capillary: 406 mg/dL — ABNORMAL HIGH (ref 70–99)
Glucose-Capillary: 171 mg/dL — ABNORMAL HIGH (ref 70–99)
Glucose-Capillary: 216 mg/dL — ABNORMAL HIGH (ref 70–99)

## 2014-04-18 LAB — CBC WITH DIFFERENTIAL/PLATELET
Basophils Absolute: 0 10*3/uL (ref 0.0–0.1)
Basophils Relative: 0 % (ref 0–1)
EOS ABS: 0.1 10*3/uL (ref 0.0–0.7)
Eosinophils Relative: 2 % (ref 0–5)
HCT: 31 % — ABNORMAL LOW (ref 39.0–52.0)
Hemoglobin: 11 g/dL — ABNORMAL LOW (ref 13.0–17.0)
LYMPHS ABS: 1.4 10*3/uL (ref 0.7–4.0)
Lymphocytes Relative: 36 % (ref 12–46)
MCH: 30.2 pg (ref 26.0–34.0)
MCHC: 35.5 g/dL (ref 30.0–36.0)
MCV: 85.2 fL (ref 78.0–100.0)
Monocytes Absolute: 0.5 10*3/uL (ref 0.1–1.0)
Monocytes Relative: 13 % — ABNORMAL HIGH (ref 3–12)
NEUTROS PCT: 49 % (ref 43–77)
Neutro Abs: 1.8 10*3/uL (ref 1.7–7.7)
PLATELETS: 157 10*3/uL (ref 150–400)
RBC: 3.64 MIL/uL — AB (ref 4.22–5.81)
RDW: 11.7 % (ref 11.5–15.5)
WBC: 3.7 10*3/uL — AB (ref 4.0–10.5)

## 2014-04-18 LAB — COMPREHENSIVE METABOLIC PANEL
ALT: 18 U/L (ref 0–53)
AST: 27 U/L (ref 0–37)
Albumin: 2.9 g/dL — ABNORMAL LOW (ref 3.5–5.2)
Alkaline Phosphatase: 45 U/L (ref 39–117)
Anion gap: 12 (ref 5–15)
BUN: 31 mg/dL — AB (ref 6–23)
CALCIUM: 9.5 mg/dL (ref 8.4–10.5)
CO2: 27 meq/L (ref 19–32)
CREATININE: 1.25 mg/dL (ref 0.50–1.35)
Chloride: 99 mEq/L (ref 96–112)
GFR calc Af Amer: 57 mL/min — ABNORMAL LOW (ref >90)
GFR, EST NON AFRICAN AMERICAN: 49 mL/min — AB (ref >90)
Glucose, Bld: 82 mg/dL (ref 70–99)
Potassium: 3.9 mEq/L (ref 3.7–5.3)
Sodium: 138 mEq/L (ref 137–147)
Total Bilirubin: 0.6 mg/dL (ref 0.3–1.2)
Total Protein: 5.7 g/dL — ABNORMAL LOW (ref 6.0–8.3)

## 2014-04-18 LAB — HEMOGLOBIN A1C
Hgb A1c MFr Bld: 18.8 % — ABNORMAL HIGH (ref <5.7)
Hgb A1c MFr Bld: 18.8 % — ABNORMAL HIGH (ref <5.7)
MEAN PLASMA GLUCOSE: 493 mg/dL — AB (ref <117)
Mean Plasma Glucose: 493 mg/dL — ABNORMAL HIGH (ref <117)

## 2014-04-18 MED ORDER — INFLUENZA VAC SPLIT QUAD 0.5 ML IM SUSY
0.5000 mL | PREFILLED_SYRINGE | INTRAMUSCULAR | Status: AC
  Administered 2014-04-19: 0.5 mL via INTRAMUSCULAR
  Filled 2014-04-18: qty 0.5

## 2014-04-18 MED ORDER — INSULIN STARTER KIT- SYRINGES (ENGLISH)
1.0000 | Freq: Once | Status: AC
Start: 2014-04-18 — End: 2014-04-18
  Administered 2014-04-18: 1
  Filled 2014-04-18: qty 1

## 2014-04-18 NOTE — Evaluation (Signed)
Physical Therapy Evaluation Patient Details Name: [name] MRN: [id number] DOB: Jun 16, 1922 Today's Date: 04/18/2014   History of Present Illness  80 y.o. year old male with significant past medical history of type 2 DM-non compliant, HTN, HLD, CAD s/p stent 06/2013 presenting with hyperglycemia, dehydration. Per the daughter, pt has been doing  Excessive amount of manual labor outdoors over the past month. Pt has not been compliant with diabetic medications or adequate fluid intake per daughter. Has had some falls at home.  Clinical Impression  Pt admitted with above. Currently with functional limitations due to the deficits listed below (see PT Problem List). Pt mildly disoriented and with history of several falls at home. Ideally pt would have 24 hour supervision at home due to fall risk but, states his daughter (who lives with the patient)  works during the day. I feel if he is oriented enough to remember to use an assistive device (rolling walker or cane) pt will be adequate to return home with home health physical therapy; otherwise SNF may be indicated for patient's safety. Pt will benefit from skilled PT to increase their independence and safety with mobility to allow discharge to the venue listed below.     Follow Up Recommendations Home health PT;Supervision for mobility/OOB    Equipment Recommendations  None recommended by PT    Recommendations for Other Services OT consult     Precautions / Restrictions Precautions Precautions: Fall Restrictions Weight Bearing Restrictions: No      Mobility  Bed Mobility Overal bed mobility: Modified Independent                Transfers Overall transfer level: Needs assistance Equipment used: Rolling walker (2 wheeled);None Transfers: Sit to/from Stand Sit to Stand: Supervision         General transfer comment: Supervision for safety from lowest bed setting. Initially without assistive device, pt reaching for furniture  with mild instability but no physical assist required; improved stability with rolling walker.  Ambulation/Gait Ambulation/Gait assistance: Supervision Ambulation Distance (Feet): 250 Feet (60 feet with rolling walker) Assistive device: Rolling walker (2 wheeled);None Gait Pattern/deviations: Step-through pattern   Gait velocity interpretation: at or above normal speed for age/gender General Gait Details: Dynamic gait training and challenges with head turns, high stepping, change of speed and quick turns. Mild loss of balance however pt able to self correct without physical assistance. Shorter distance covered with use of a rolling walker and improves stability. Dynamically pt with better stability than static challenges such as tandem stance requiring pt hold furniture in hall for balance.  Stairs            Wheelchair Mobility    Modified Rankin (Stroke Patients Only)       Balance Overall balance assessment: Needs assistance Sitting-balance support: No upper extremity supported;Feet supported Sitting balance-Leahy Scale: Good     Standing balance support: No upper extremity supported Standing balance-Leahy Scale: Fair       Tandem Stance - Right Leg: 0 Tandem Stance - Left Leg: 0         Standardized Balance Assessment Standardized Balance Assessment : Dynamic Gait Index   Dynamic Gait Index Level Surface: Normal Change in Gait Speed: Normal Gait with Horizontal Head Turns: Mild Impairment Gait with Vertical Head Turns: Mild Impairment Gait and Pivot Turn: Mild Impairment Step Over Obstacle:  (not assessed) Step Around Obstacles:  (not assessed) Steps:  (not assessed)       Pertinent Vitals/Pain Pain Assessment: No/denies pain  Home Living Family/patient expects to be discharged to:: Unsure Living Arrangements: Children Available Help at Discharge: Family;Other (Comment) (pt alone from 1PM -10PM) Type of Home: House Home Access: Stairs to  enter Entrance Stairs-Rails: Right;Left;Can reach both Entrance Stairs-Number of Steps: 1 Home Layout: One level Home Equipment: Cane - single point;Toilet riser;Grab bars - toilet;Shower seat      Prior Function Level of Independence: Independent               Hand Dominance   Dominant Hand: Right    Extremity/Trunk Assessment   Upper Extremity Assessment: Defer to OT evaluation           Lower Extremity Assessment: RLE deficits/detail;LLE deficits/detail RLE Deficits / Details: strength within functional limits. poor light touch sensation in feet bilaterally. LLE Deficits / Details: strength within functional limits. poor light touch sensation in feet bilaterally.     Communication   Communication: No difficulties  Cognition Arousal/Alertness: Awake/alert Behavior During Therapy: WFL for tasks assessed/performed (labile - very happy then crying at end of session) Overall Cognitive Status: No family/caregiver present to determine baseline cognitive functioning (disoriented to month and year.)       Memory: Decreased short-term memory              General Comments      Exercises        Assessment/Plan    PT Assessment Patient needs continued PT services  PT Diagnosis Abnormality of gait;Difficulty walking   PT Problem List Decreased activity tolerance;Decreased balance;Decreased mobility;Decreased cognition;Decreased knowledge of use of DME;Decreased safety awareness;Impaired sensation  PT Treatment Interventions DME instruction;Gait training;Stair training;Functional mobility training;Therapeutic activities;Therapeutic exercise;Balance training;Neuromuscular re-education;Cognitive remediation;Patient/family education;Modalities   PT Goals (Current goals can be found in the Care Plan section) Acute Rehab PT Goals Patient Stated Goal: Go home PT Goal Formulation: With patient Time For Goal Achievement: 04/25/14 Potential to Achieve Goals: Good     Frequency Min 3X/week   Barriers to discharge Decreased caregiver support at home alone from 1pm-10pm daily    Co-evaluation               End of Session   Activity Tolerance: Patient tolerated treatment well Patient left: in bed;with call bell/phone within reach;with bed alarm set;with nursing/sitter in room Nurse Communication: Mobility status    Functional Assessment Tool Used: clinical observation Functional Limitation: Mobility: Walking and moving around Mobility: Walking and Moving Around Current Status (T0354): At least 1 percent but less than 20 percent impaired, limited or restricted Mobility: Walking and Moving Around Goal Status [phone]): At least 1 percent but less than 20 percent impaired, limited or restricted    Time: 1204-1240 (6 minutes non-therapeutic while pt toileting) PT Time Calculation (min): 36 min   Charges:   PT Evaluation $Initial PT Evaluation Tier I: 1 Procedure PT Treatments $Gait Training: 8-22 mins   PT G Codes:   Functional Assessment Tool Used: clinical observation Functional Limitation: Mobility: Walking and moving around   [place], [place]  Ellouise Newer 04/18/2014, 2:12 PM

## 2014-04-18 NOTE — Progress Notes (Signed)
[facility] PROGRESS NOTE  [name] BDZ:[id number] DOB: 10-07-1921 DOA: 04/17/2014 PCP: Gwendolyn Grant, MD  Assessment/Plan:  Active Problems:   Hyperglycemia - resolved - will repeat [id number]    AKI (acute kidney injury) - resolving with IVF rehydration    Dehydration - IVF rehydration and improved oral intake - will provide more fluid rehydration for the next 24 hours and reassess next am.  Code Status: full Family Communication: d/c daughter at bedside Disposition Plan: pending improvement in condition   Consultants:  none  Procedures:  none  Antibiotics:   none  HPI/Subjective: Pt has no new complaints. No acute issues overnight.  Objective: Filed Vitals:   04/18/14 1332  BP: 109/60  Pulse: 76  Temp: 98 F (36.7 C)  Resp: 17    Intake/Output Summary (Last 24 hours) at 04/18/14 1429 Last data filed at 04/18/14 1332  Gross per 24 hour  Intake    462 ml  Output    500 ml  Net    -38 ml   Filed Weights   04/17/14 2200 04/18/14 0446  Weight: 69.6 kg (153 lb 7 oz) 71.7 kg (158 lb 1.1 oz)    Exam:   General:  Pt in nad, alert and awake  Cardiovascular: rrr, no mrg  Respiratory: cta bl, no wheezes  Abdomen: soft, NT, ND  Musculoskeletal: no cyanosis or clubbing   Data Reviewed: Basic Metabolic Panel:  Recent Labs Lab 04/17/14 1431 04/17/14 2135 04/18/14 0412  NA 126*  --  138  K 4.7  --  3.9  CL 84*  --  99  CO2 28  --  27  GLUCOSE 497*  --  82  BUN 41*  --  31*  CREATININE 1.48* 1.35 1.25  CALCIUM 10.4  --  9.5   Liver Function Tests:  Recent Labs Lab 04/17/14 1431 04/18/14 0412  AST 28 27  ALT 23 18  ALKPHOS 65 45  BILITOT 0.5 0.6  PROT 7.0 5.7*  ALBUMIN 3.8 2.9*   No results found for this basename: LIPASE, AMYLASE,  in the last 168 hours No results found for this basename: AMMONIA,  in the last 168 hours CBC:  Recent Labs Lab 04/17/14 1431 04/17/14 2135 04/18/14 0412  WBC 4.5 3.7* 3.7*   NEUTROABS  --   --  1.8  HGB 13.0 12.5* 11.0*  HCT 37.1* 35.2* 31.0*  MCV 85.5 85.2 85.2  PLT 186 153 157   Cardiac Enzymes: No results found for this basename: CKTOTAL, CKMB, CKMBINDEX, TROPONINI,  in the last 168 hours BNP (last 3 results) No results found for this basename: PROBNP,  in the last 8760 hours CBG:  Recent Labs Lab 04/17/14 2112 04/17/14 2342 04/18/14 0441 04/18/14 0805 04/18/14 1132  GLUCAP 406* 257* 82 148* 292*    No results found for this or any previous visit (from the past 240 hour(s)).   Studies: Ct Head Wo Contrast  04/17/2014   CLINICAL DATA:  Confusion, frequent falls over 1 week  EXAM: CT HEAD WITHOUT CONTRAST  TECHNIQUE: Contiguous axial images were obtained from the base of the skull through the vertex without intravenous contrast.  COMPARISON:  None.  FINDINGS: See assessed density left frontal encephalomalacia is identified. Mild cortical volume loss noted with proportional ventricular prominence. Areas of periventricular white matter hypodensity are most compatible with small vessel ischemic change. No acute hemorrhage, infarct, or mass lesion is identified. No midline shift. No skull fracture. Soft tissue density material within the external auditory canals  is most compatible with cerumen  IMPRESSION: Chronic findings as above, no acute intracranial abnormality.   Electronically Signed   By: Conchita Paris M.D.   On: 04/17/2014 20:40    Scheduled Meds: . aspirin EC  81 mg Oral Daily  . heparin  5,000 Units Subcutaneous 3 times per day  . [START ON 04/19/2014] Influenza vac split quadrivalent PF  0.5 mL Intramuscular Tomorrow-1000  . insulin aspart  0-9 Units Subcutaneous 6 times per day  . multivitamin with minerals  1 tablet Oral Daily  . simvastatin  20 mg Oral Daily   Continuous Infusions: . sodium chloride 125 mL/hr at 04/18/14 0507  . sodium chloride      Time spent: > 35 minutes    Velvet Bathe  [facility] Pager [phone].  If 7PM-7AM, please contact night-coverage at [email or web address], password [facility] 04/18/2014, 2:29 PM  LOS: 1 day

## 2014-04-18 NOTE — Progress Notes (Signed)
Utilization Review Completed.Debborah Alonge T9/17/2015  

## 2014-04-18 NOTE — Progress Notes (Signed)
Inpatient Diabetes Program Recommendations  AACE/ADA: New Consensus Statement on Inpatient Glycemic Control (2013)  Target Ranges:  Prepandial:   less than 140 mg/dL      Peak postprandial:   less than 180 mg/dL (1-2 hours)      Critically ill patients:  140 - 180 mg/dL   Reason for Assessment:  Results for STEPHEN, BARUCH (MRN [id number]) as of 04/18/2014 15:31  Ref. Range 04/18/2014 08:05 04/18/2014 11:32  Glucose-Capillary Latest Range: 70-99 mg/dL 148 (H) 292 (H)   Spoke briefly with patient.  He states that his daughter lives with him but he is still very independent.  He said that his daughter had told him not to go outside and cut the grass but he did it anyway.   A1C being repeated.  Note that patient may need insulin at discharge but will need to be administered by daughter.  Discussed with RN.  She states that his daughter will be in around 8 pm tonight.  Will order insulin starter kit for patient's daughter and have bedside RN review with daughter how to administer insulin just in case he is discharged home on it.   I told patient that I would be back by tomorrow but he states "I will be going home at 9:30a".  Will follow.  Thanks, Adah Perl, RN, BC-ADM Inpatient Diabetes Coordinator Pager [phone]

## 2014-04-19 ENCOUNTER — Telehealth: Payer: Self-pay | Admitting: *Deleted

## 2014-04-19 LAB — CBC WITH DIFFERENTIAL/PLATELET
BASOS PCT: 0 % (ref 0–1)
Basophils Absolute: 0 10*3/uL (ref 0.0–0.1)
EOS ABS: 0.1 10*3/uL (ref 0.0–0.7)
Eosinophils Relative: 2 % (ref 0–5)
HCT: 34.4 % — ABNORMAL LOW (ref 39.0–52.0)
Hemoglobin: 11.8 g/dL — ABNORMAL LOW (ref 13.0–17.0)
Lymphocytes Relative: 42 % (ref 12–46)
Lymphs Abs: 1.4 10*3/uL (ref 0.7–4.0)
MCH: 29.6 pg (ref 26.0–34.0)
MCHC: 34.3 g/dL (ref 30.0–36.0)
MCV: 86.4 fL (ref 78.0–100.0)
Monocytes Absolute: 0.3 10*3/uL (ref 0.1–1.0)
Monocytes Relative: 9 % (ref 3–12)
NEUTROS ABS: 1.5 10*3/uL — AB (ref 1.7–7.7)
NEUTROS PCT: 47 % (ref 43–77)
Platelets: 170 10*3/uL (ref 150–400)
RBC: 3.98 MIL/uL — ABNORMAL LOW (ref 4.22–5.81)
RDW: 11.7 % (ref 11.5–15.5)
WBC: 3.3 10*3/uL — ABNORMAL LOW (ref 4.0–10.5)

## 2014-04-19 LAB — COMPREHENSIVE METABOLIC PANEL
ALT: 23 U/L (ref 0–53)
ANION GAP: 11 (ref 5–15)
AST: 37 U/L (ref 0–37)
Albumin: 3.2 g/dL — ABNORMAL LOW (ref 3.5–5.2)
Alkaline Phosphatase: 51 U/L (ref 39–117)
BILIRUBIN TOTAL: 0.7 mg/dL (ref 0.3–1.2)
BUN: 16 mg/dL (ref 6–23)
CHLORIDE: 102 meq/L (ref 96–112)
CO2: 28 meq/L (ref 19–32)
Calcium: 9.3 mg/dL (ref 8.4–10.5)
Creatinine, Ser: 1.01 mg/dL (ref 0.50–1.35)
GFR, EST AFRICAN AMERICAN: 73 mL/min — AB (ref >90)
GFR, EST NON AFRICAN AMERICAN: 63 mL/min — AB (ref >90)
GLUCOSE: 169 mg/dL — AB (ref 70–99)
Potassium: 3.7 mEq/L (ref 3.7–5.3)
SODIUM: 141 meq/L (ref 137–147)
Total Protein: 6.1 g/dL (ref 6.0–8.3)

## 2014-04-19 LAB — GLUCOSE, CAPILLARY
GLUCOSE-CAPILLARY: 198 mg/dL — AB (ref 70–99)
Glucose-Capillary: 149 mg/dL — ABNORMAL HIGH (ref 70–99)
Glucose-Capillary: 155 mg/dL — ABNORMAL HIGH (ref 70–99)

## 2014-04-19 LAB — HEMOGLOBIN A1C
Hgb A1c MFr Bld: 18.4 % — ABNORMAL HIGH (ref <5.7)
Mean Plasma Glucose: 481 mg/dL — ABNORMAL HIGH (ref <117)

## 2014-04-19 NOTE — Discharge Summary (Signed)
Physician Discharge Summary  [name] ERD:[id number] DOB: 1921-10-28 DOA: 04/17/2014  PCP: Gwendolyn Grant, MD  Admit date: 04/17/2014 Discharge date: 04/19/2014  Time spent: > 35 minutes  Recommendations for Outpatient Follow-up:  1. Monitor blood sugars and adjust diabetic medication accordingly 2. Discontinued blood pressure medications given target blood pressures off of the antihypertensive medications while patient in house. 3. Pt has had poor compliance with diabetic medications at home and reports increase in weight loss. May want to consider further work up for this if this continues to be a problem despite taking diabetic medication as recommended  Discharge Diagnoses:  Active Problems:   Hyperglycemia   AKI (acute kidney injury)   Dehydration   Discharge Condition: stable  Diet recommendation: diabetic diet  Filed Weights   04/17/14 2200 04/18/14 0446 04/19/14 0512  Weight: 69.6 kg (153 lb 7 oz) 71.7 kg (158 lb 1.1 oz) 71.6 kg (157 lb 13.6 oz)    History of present illness:  From original HPI: This is a 80 y.o. year old male with significant past medical history of type 2 DM-non compliant, HTN, HLD, CAD s/p stent 06/2013 presenting with hyperglycemia, dehydration. Per the daughter, pt has been doing Excessive amount of manual labor outdoors over the past month.    Hospital Course:  Active Problems:   Hyperglycemia  - resolved  - will repeat hgba1c   AKI (acute kidney injury)  - resolving with IVF rehydration   Dehydration  - IVF rehydration and improved oral intake  - will provide more fluid rehydration for the next 24 hours and reassess next am.  Procedures:  None  Consultations:  None  Discharge Exam: Filed Vitals:   04/19/14 0551  BP: 127/68  Pulse: 67  Temp: 98.1 F (36.7 C)  Resp: 19    General: pt in nad, alert and awake Cardiovascular: rrr, no mrg Respiratory: cta bl, no wheezes  Discharge Instructions You were cared for by a  hospitalist during your hospital stay. If you have any questions about your discharge medications or the care you received while you were in the hospital after you are discharged, you can call the unit and asked to speak with the hospitalist on call if the hospitalist that took care of you is not available. Once you are discharged, your primary care physician will handle any further medical issues. Please note that NO REFILLS for any discharge medications will be authorized once you are discharged, as it is imperative that you return to your primary care physician (or establish a relationship with a primary care physician if you do not have one) for your aftercare needs so that they can reassess your need for medications and monitor your lab values.  Discharge Instructions   Call MD for:  extreme fatigue    Complete by:  As directed      Call MD for:  temperature >100.4    Complete by:  As directed      Diet - low sodium heart healthy    Complete by:  As directed      Discharge instructions    Complete by:  As directed   Recommend patient f/u with his primary care physician within the next 1-2 weeks. Also recommend checking blood sugars atleast 2 times per day.     Increase activity slowly    Complete by:  As directed           Current Discharge Medication List    CONTINUE these medications which have NOT CHANGED  Details  aspirin EC 81 MG tablet Take 1 tablet (81 mg total) by mouth daily. Qty: 150 tablet, Refills: 2    glyBURIDE-metformin (GLUCOVANCE) 5-500 MG per tablet Take 1 tablet by mouth 2 (two) times daily with a meal. TAKE ONE TABLET BY MOUTH TWICE DAILY FOR DIABETES    Loratadine 10 MG CAPS Take 1 capsule by mouth daily.    Multiple Vitamin (MULTIVITAMIN WITH MINERALS) TABS tablet Take 1 tablet by mouth daily.    simvastatin (ZOCOR) 20 MG tablet Take 20 mg by mouth daily.    Blood Glucose Monitoring Suppl (PRODIGY AUTOCODE BLOOD GLUCOSE) W/DEVICE KIT Use as directed Qty: 1  each, Refills: 0    PRODIGY TWIST TOP LANCETS 28G MISC 1 each by Does not apply route daily. Qty: 1 each, Refills: 2      STOP taking these medications     hydrochlorothiazide (HYDRODIURIL) 25 MG tablet      losartan (COZAAR) 25 MG tablet        No Known Allergies    The results of significant diagnostics from this hospitalization (including imaging, microbiology, ancillary and laboratory) are listed below for reference.    Significant Diagnostic Studies: Ct Head Wo Contrast  04/17/2014   CLINICAL DATA:  Confusion, frequent falls over 1 week  EXAM: CT HEAD WITHOUT CONTRAST  TECHNIQUE: Contiguous axial images were obtained from the base of the skull through the vertex without intravenous contrast.  COMPARISON:  None.  FINDINGS: See assessed density left frontal encephalomalacia is identified. Mild cortical volume loss noted with proportional ventricular prominence. Areas of periventricular white matter hypodensity are most compatible with small vessel ischemic change. No acute hemorrhage, infarct, or mass lesion is identified. No midline shift. No skull fracture. Soft tissue density material within the external auditory canals is most compatible with cerumen  IMPRESSION: Chronic findings as above, no acute intracranial abnormality.   Electronically Signed   By: Conchita Paris M.D.   On: 04/17/2014 20:40    Microbiology: No results found for this or any previous visit (from the past 240 hour(s)).   Labs: Basic Metabolic Panel:  Recent Labs Lab 04/17/14 1431 04/17/14 2135 04/18/14 0412 04/19/14 0430  NA 126*  --  138 141  K 4.7  --  3.9 3.7  CL 84*  --  99 102  CO2 28  --  27 28  GLUCOSE 497*  --  82 169*  BUN 41*  --  31* 16  CREATININE 1.48* 1.35 1.25 1.01  CALCIUM 10.4  --  9.5 9.3   Liver Function Tests:  Recent Labs Lab 04/17/14 1431 04/18/14 0412 04/19/14 0430  AST 28 27 37  ALT '23 18 23  ' ALKPHOS 65 45 51  BILITOT 0.5 0.6 0.7  PROT 7.0 5.7* 6.1  ALBUMIN  3.8 2.9* 3.2*   No results found for this basename: LIPASE, AMYLASE,  in the last 168 hours No results found for this basename: AMMONIA,  in the last 168 hours CBC:  Recent Labs Lab 04/17/14 1431 04/17/14 2135 04/18/14 0412 04/19/14 0430  WBC 4.5 3.7* 3.7* 3.3*  NEUTROABS  --   --  1.8 1.5*  HGB 13.0 12.5* 11.0* 11.8*  HCT 37.1* 35.2* 31.0* 34.4*  MCV 85.5 85.2 85.2 86.4  PLT 186 153 157 170   Cardiac Enzymes: No results found for this basename: CKTOTAL, CKMB, CKMBINDEX, TROPONINI,  in the last 168 hours BNP: BNP (last 3 results) No results found for this basename: PROBNP,  in the last  8760 hours CBG:  Recent Labs Lab 04/18/14 1608 04/18/14 2005 04/19/14 0025 04/19/14 0422 04/19/14 0816  GLUCAP 171* 216* 198* 149* 155*       Signed:  Velvet Bathe  [facility] 04/19/2014, 10:30 AM

## 2014-04-19 NOTE — Progress Notes (Signed)
Discharge instructions given and explained to pt and pt's daughter, who is the caregiver at home.  Pt's daughter verbalized understanding of all orders/instructions.  IV removed by NT.  VSS. Pt in stable condition to d/c home.  Awaiting HH to be set up. Syliva Overman

## 2014-04-19 NOTE — Progress Notes (Signed)
Inpatient Diabetes Program Recommendations  AACE/ADA: New Consensus Statement on Inpatient Glycemic Control (2013)  Target Ranges:  Prepandial:   less than 140 mg/dL      Peak postprandial:   less than 180 mg/dL (1-2 hours)      Critically ill patients:  140 - 180 mg/dL   Reason for Visit: Follow-up with patient's daughter regarding her father's diabetes.  She notes that she has recently stopped working from home and has been unable to monitor what her father is eating, drinking and the manual labor he's been doing.  She feels that she thinks that this is why his A1C has gone from 6.9% to 18.8%.  Attempted to tell daughter that the increase in A1C is significant.  She states that she was reluctant to give her father insulin at home because she felt that this could be controlled with diet and him taking his pills.  Explained that I was concerned that the pills would not be enough.  Encouraged her to check CBG's at least twice daily and if greater that 250 mg/dL consistently she will need to alert PCP, Dr. Asa Lente.  Discussed with Dr. Wendee Beavers and he states that he will have her F/U with PCP ASAP.  She verbalized understanding.  Also explained to patient that he needs to let his daughter check his CBG's and not drink sweetened beverages so he does not have to come back to the hospital.  Insulin starter kit given to daughter and taught by bedside RN.   May also benefit from Home health if appropriate?  Thanks, Adah Perl, RN, BC-ADM Inpatient Diabetes Coordinator Pager [phone]    D

## 2014-04-19 NOTE — Care Management Note (Signed)
  Page 1 of 1   04/19/2014     11:23:45 AM CARE MANAGEMENT NOTE 04/19/2014  Patient:  [name], [name]   Account Number:  [id number]  Date Initiated:  04/19/2014  Documentation initiated by:  Magdalen Spatz  Subjective/Objective Assessment:     Action/Plan:   Anticipated DC Date:  04/19/2014   Anticipated DC Plan:  [place]         Choice offered to / List presented to:  C-4 Adult Children        HH arranged  HH-1 RN      [place].   Status of service:  Completed, signed off Medicare Important Message given?   (If response is "NO", the following Medicare IM given date fields will be blank) Date Medicare IM given:   Medicare IM given by:   Date Additional Medicare IM given:   Additional Medicare IM given by:    Discharge Disposition:    Per UR Regulation:    If discussed at Long Length of Stay Meetings, dates discussed:    Comments:  04-19-14 Order for shower stool. Called [facility] ) .  [employer] Medicare  does not pay for stool stool , advised , it is cheaper for patient to purhase one at department store cost $30 to $40. Mal Amabile patient's daughter and she is aware. Magdalen Spatz RN BSN [phone]

## 2014-04-19 NOTE — Telephone Encounter (Signed)
Called pt/daughter concerning TCM hosp discharge no answer LMOM RTC...Johny Chess

## 2014-04-22 ENCOUNTER — Telehealth: Payer: Self-pay | Admitting: Internal Medicine

## 2014-04-22 NOTE — Telephone Encounter (Signed)
Called pt/daughter again still no answer. Closing phone note...[name]

## 2014-04-22 NOTE — Telephone Encounter (Signed)
Verbal ok for orders as requested

## 2014-04-22 NOTE — Telephone Encounter (Signed)
[name] was returning call, she asked if we would call [phone] Her cell number

## 2014-04-22 NOTE — Telephone Encounter (Signed)
Katharine Look called in from [facility] and is needing orders for this pt.  Pt Referral Order  Medical Social Worker Order   Fax number [phone] She is requesting for [place] to fax orders to this number

## 2014-04-22 NOTE — Telephone Encounter (Signed)
Called pt/daughter again still no answer LMOM RTC../lmb 

## 2014-04-23 ENCOUNTER — Telehealth: Payer: Self-pay | Admitting: *Deleted

## 2014-04-23 NOTE — Telephone Encounter (Signed)
Daughter return call back will address TCM call below  Transition Care Management Follow-up Telephone Call How have you been since you were released from the hospital? Daughter stated he is doing ok   Do you understand why you were in the hospital? YES, they understood why he was admitted to hospital  Do you understand the discharge instrcutions? YES, daughter stated the nurse went over at discharge  Items Reviewed:  Medications reviewed: YES, reviewed but daughter said hospital didn't tell them to stop taking his losartan & HCTZ. Pt has been taking she stated [facility] came out on Sat BP was 138/68. Inform daughter to make sure to ask md tomorrow if he need to continue taking  Allergies reviewed: YES, no changes  Dietary changes reviewed: YES, daughter stated he follows a diabetic diet  Referrals reviewed: No referral   Functional Questionnaire:  Activities of Daily Living (ADLs):   Daughter stated he is independent able to get around slowly bathe, and dress his self Daughter stated no assistance is needed   Any transportation issues/concerns?: No daughter will bring him to his appts   Any patient concerns? YES, daughter stated they fail to get a rx for shower chair inform daughter Dr. Asa Lente can rx tomorrow   Confirmed importance and date/time of follow-up visits scheduled: YES, appt was already made advise her to keep appt   Confirmed with patient if condition begins to worsen call PCP or go to the ER.  Patient was given the Call-a-Nurse line [phone]: YES provided call-a-nurse #

## 2014-04-23 NOTE — Telephone Encounter (Signed)
Daughter returning call back concerning dad hosp discharge...[name]

## 2014-04-23 NOTE — Telephone Encounter (Signed)
LVM with verbal MD order for Medical Social Worker Order.

## 2014-04-24 ENCOUNTER — Other Ambulatory Visit (INDEPENDENT_AMBULATORY_CARE_PROVIDER_SITE_OTHER): Payer: Medicare HMO

## 2014-04-24 ENCOUNTER — Ambulatory Visit (INDEPENDENT_AMBULATORY_CARE_PROVIDER_SITE_OTHER): Payer: Medicare HMO | Admitting: Internal Medicine

## 2014-04-24 ENCOUNTER — Encounter: Payer: Self-pay | Admitting: Internal Medicine

## 2014-04-24 ENCOUNTER — Ambulatory Visit: Payer: Medicare Other | Admitting: Internal Medicine

## 2014-04-24 VITALS — BP 152/76 | HR 70 | Temp 98.2°F | Ht 71.0 in | Wt 157.8 lb

## 2014-04-24 DIAGNOSIS — IMO0001 Reserved for inherently not codable concepts without codable children: Secondary | ICD-10-CM

## 2014-04-24 DIAGNOSIS — N179 Acute kidney failure, unspecified: Secondary | ICD-10-CM

## 2014-04-24 DIAGNOSIS — E785 Hyperlipidemia, unspecified: Secondary | ICD-10-CM

## 2014-04-24 DIAGNOSIS — E1165 Type 2 diabetes mellitus with hyperglycemia: Principal | ICD-10-CM

## 2014-04-24 DIAGNOSIS — I10 Essential (primary) hypertension: Secondary | ICD-10-CM

## 2014-04-24 LAB — BASIC METABOLIC PANEL
BUN: 20 mg/dL (ref 6–23)
CHLORIDE: 99 meq/L (ref 96–112)
CO2: 32 mEq/L (ref 19–32)
Calcium: 10.2 mg/dL (ref 8.4–10.5)
Creatinine, Ser: 1.2 mg/dL (ref 0.4–1.5)
GFR: 74.57 mL/min (ref >60.00)
GLUCOSE: 215 mg/dL — AB (ref 70–99)
Potassium: 4.2 mEq/L (ref 3.5–5.1)
Sodium: 137 mEq/L (ref 135–145)

## 2014-04-24 MED ORDER — HYDROCHLOROTHIAZIDE 25 MG PO TABS: 25.0000 mg | ORAL_TABLET | Freq: Every day | ORAL | Status: DC

## 2014-04-24 MED ORDER — LOSARTAN POTASSIUM 25 MG PO TABS: 25.0000 mg | ORAL_TABLET | Freq: Every day | ORAL | Status: DC

## 2014-04-24 NOTE — Progress Notes (Signed)
Pre visit review using our clinic review tool, if applicable. No additional management support is needed unless otherwise documented below in the visit note. 

## 2014-04-24 NOTE — Assessment & Plan Note (Signed)
BP Readings from Last 3 Encounters:  04/24/14 152/76  04/19/14 127/68  10/22/13 140/78  resume HCTZ with low dose ARB now - given co-dx DM2 and resolution of dehydration/AKI Dtr to monitor at home and call if BP>140

## 2014-04-24 NOTE — Assessment & Plan Note (Signed)
On metformin and OHA for years Became uncontrolled summer 2015 due to noncompliance (dtr was not supervising closely and pt stopped taking meds) Hosp for dehydration related to uncontrolled hyperglycemia 04/2014 reviewed - corrected with IVF and back on oral meds Also statin -  added asa 81 given CAD hx (06/2013) resume ARB now (losaratan 25mg  qd) Check labs q58mo, titrate as needed follow up optho annually  Lab Results  Component Value Date   HGBA1C 18.4* 04/19/2014

## 2014-04-24 NOTE — Assessment & Plan Note (Signed)
Resolved with IVF following hosp for dehydration due to uncontrolled DM 04/2014 Recheck labs now

## 2014-04-24 NOTE — Progress Notes (Signed)
Subjective:    Patient ID: [name], male    DOB: 26-Feb-1922, 80 y.o.   MRN: [id number]  HPI  Patient here for follow up - DC summary reviewed (9/16-9/18) Also reviewed chronic medical issues and interval medical events  Past Medical History  Diagnosis Date  . Arthritis   . Type 2 diabetes mellitus   . Depression   . Dyslipidemia   . Hypertension   . CAD (coronary artery disease)     s/p DES to LAD 06/2013  . Glaucoma   . Hyperglycemia 04/17/2014    Review of Systems  Constitutional: Positive for fatigue and unexpected weight change (loss). Negative for fever.  Respiratory: Negative for cough and shortness of breath.   Cardiovascular: Negative for chest pain and leg swelling.  Neurological: Positive for weakness.       Objective:   Physical Exam  BP 152/76  Pulse 70  Temp(Src) 98.2 F (36.8 C) (Oral)  Ht 5\' 11"  (1.803 m)  Wt 157 lb 12 oz (71.555 kg)  BMI 22.01 kg/m2  SpO2 94% Wt Readings from Last 3 Encounters:  04/24/14 157 lb 12 oz (71.555 kg)  04/19/14 157 lb 13.6 oz (71.6 kg)  10/22/13 176 lb (79.833 kg)   Constitutional: he appears well-developed and well-nourished. No distress. Dtr at side Neck: Normal range of motion. Neck supple. No JVD present. No thyromegaly present.  Cardiovascular: Normal rate, regular rhythm and normal heart sounds.  No murmur heard. No BLE edema. Pulmonary/Chest: Effort normal and breath sounds normal. No respiratory distress. he has no wheezes.  Psychiatric: he has a normal mood and affect. His behavior is normal. Judgment and thought content normal.   Lab Results  Component Value Date   WBC 3.3* 04/19/2014   HGB 11.8* 04/19/2014   HCT 34.4* 04/19/2014   PLT 170 04/19/2014   GLUCOSE 169* 04/19/2014   CHOL 159 06/22/2013   TRIG 150.0* 06/22/2013   HDL 38.10* 06/22/2013   LDLCALC 91 06/22/2013   ALT 23 04/19/2014   AST 37 04/19/2014   NA 141 04/19/2014   K 3.7 04/19/2014   CL 102 04/19/2014   CREATININE 1.01 04/19/2014   BUN  16 04/19/2014   CO2 28 04/19/2014   HGBA1C 18.4* 04/19/2014   MICROALBUR 8.5* 06/22/2013    Ct Head Wo Contrast  04/17/2014   CLINICAL DATA:  Confusion, frequent falls over 1 week  EXAM: CT HEAD WITHOUT CONTRAST  TECHNIQUE: Contiguous axial images were obtained from the base of the skull through the vertex without intravenous contrast.  COMPARISON:  None.  FINDINGS: See assessed density left frontal encephalomalacia is identified. Mild cortical volume loss noted with proportional ventricular prominence. Areas of periventricular white matter hypodensity are most compatible with small vessel ischemic change. No acute hemorrhage, infarct, or mass lesion is identified. No midline shift. No skull fracture. Soft tissue density material within the external auditory canals is most compatible with cerumen  IMPRESSION: Chronic findings as above, no acute intracranial abnormality.   Electronically Signed   By: Conchita Paris M.D.   On: 04/17/2014 20:40       Assessment & Plan:   Hospital follow up within 7 days of discharge Contact was made within 48h of discharge to arrange follow up  Problem List Items Addressed This Visit   Dyslipidemia     On statin - Check lipids annually, titrate as needed The current medical regimen is effective;  continue present plan and medications.      Relevant Orders  DME Other see comment   Hypertension      BP Readings from Last 3 Encounters:  04/24/14 152/76  04/19/14 127/68  10/22/13 140/78  resume HCTZ with low dose ARB now - given co-dx DM2 and resolution of dehydration/AKI Dtr to monitor at home and call if BP>140    Relevant Medications      hydrochlorothiazide tablet      losartan (COZAAR) tablet   Other Relevant Orders      DME Other see comment      Basic metabolic panel   Type II or unspecified type diabetes mellitus without mention of complication, uncontrolled - Primary      On metformin and OHA for years Became uncontrolled summer 2015  due to noncompliance (dtr was not supervising closely and pt stopped taking meds) Hosp for dehydration related to uncontrolled hyperglycemia 04/2014 reviewed - corrected with IVF and back on oral meds Also statin -  added asa 81 given CAD hx (06/2013) resume ARB now (losaratan 25mg  qd) Check labs q65mo, titrate as needed follow up optho annually  Lab Results  Component Value Date   HGBA1C 18.4* 04/19/2014      Relevant Medications      losartan (COZAAR) tablet   Other Relevant Orders      DME Other see comment      Basic metabolic panel    Other Visit Diagnoses   AKI (acute kidney injury)        Relevant Orders       DME Other see comment       Basic metabolic panel

## 2014-04-24 NOTE — Assessment & Plan Note (Signed)
On statin - Check lipids annually, titrate as needed The current medical regimen is effective;  continue present plan and medications.

## 2014-04-24 NOTE — Patient Instructions (Signed)
It was good to see you today.  We have reviewed your prior records including hospitalization, labs and tests today  Test(s) ordered today. Your results will be released to [place] (or called to you) after review, usually within 72hours after test completion. If any changes need to be made, you will be notified at that same time.  Medications reviewed and updated, no changes recommended at this time. Ok to resume losartan and HCTZ daily Refill on medication(s) as discussed today.  Prescription for shower chair given to you today  Please schedule followup in 3 months for diabetes mellitus check, call sooner if problems.

## 2014-04-30 ENCOUNTER — Telehealth: Payer: Self-pay

## 2014-04-30 NOTE — Telephone Encounter (Signed)
Verbal ok for SW No new med orders at this time

## 2014-04-30 NOTE — Telephone Encounter (Signed)
Beth called in with some information and request for oders:  Low 200's to 250's fasting blood sugar in the morning  Evening are from 156 to 357 and not sure if it fasting in the evening. Daughter is not at home and doesn't know if he has eaten and the pt does not remember if he has eaten.   Order request for Social Worker to come out.

## 2014-05-02 ENCOUNTER — Telehealth: Payer: Self-pay | Admitting: *Deleted

## 2014-05-02 ENCOUNTER — Other Ambulatory Visit: Payer: Self-pay

## 2014-05-02 MED ORDER — GLUCOSE BLOOD VI STRP: ORAL_STRIP | Status: DC

## 2014-05-02 MED ORDER — SITAGLIPTIN PHOSPHATE 50 MG PO TABS: 50.0000 mg | ORAL_TABLET | Freq: Every day | ORAL | Status: DC

## 2014-05-02 NOTE — Telephone Encounter (Signed)
Called [name] no answer LMOM with md response. Also called pt daughter no answer LMOM with med change.Marland KitchenJohny Chess

## 2014-05-02 NOTE — Telephone Encounter (Signed)
Left msg on triage stating spoke with nurse on Tues haven't received call back. Called beth back gave her md response below. She stated nurse had ok order for SW she was suppose to get back with me concerning pt elevated BS. They have been running consistanly high for past 10 days or so. Sending md updated msg...Johny Chess

## 2014-05-02 NOTE — Telephone Encounter (Signed)
Nurse stated pt BS has been running high constantly for the past 10 days or so. In the morning anywhere between 200"s-250. Evening running anywhere from 156-357. The daughter is not at home and doesn't know if he has eaten, and pt doesn't remember...Gust Rung

## 2014-05-02 NOTE — Telephone Encounter (Signed)
In addition to current meds, start Januvia 50mg  once daily - erx done

## 2014-05-03 ENCOUNTER — Telehealth: Payer: Self-pay | Admitting: *Deleted

## 2014-05-03 MED ORDER — GLUCOSE BLOOD VI STRP: 1.0000 | ORAL_STRIP | Freq: Two times a day (BID) | Status: DC

## 2014-05-03 NOTE — Telephone Encounter (Signed)
Received call from pt daughter stating that the medication md sent in "[place]" is too expensive. Cost $300. If md is going to rx something else would like generic please.  She also stated want to let md know that the nurse that is coming out and reporting is exaggerating, and she not listening to her. She is requesting that another nurse come out she does no want her back to the house. Ask daughter what pt BS was this am she stated they was out of strips and [facility] suppose to have sent request. Inform her per chart we haven't received, but I can go ahead and send strips to them & once md return on Monday will give her a call bck with her response...Johny Chess

## 2014-05-05 MED ORDER — GLYBURIDE-METFORMIN 5-500 MG PO TABS: 2.0000 | ORAL_TABLET | Freq: Two times a day (BID) | ORAL | Status: DC

## 2014-05-05 NOTE — Telephone Encounter (Signed)
Since [place] is too expensive, increase glyburide metformin to 2 pills BID - new erx done No other rx suggestions Ok on strips I can not recommended a different HHRN, but she is welcome to call [facility] and request different RN

## 2014-05-06 DIAGNOSIS — Z9181 History of falling: Secondary | ICD-10-CM

## 2014-05-06 DIAGNOSIS — E11641 Type 2 diabetes mellitus with hypoglycemia with coma: Secondary | ICD-10-CM

## 2014-05-06 DIAGNOSIS — I10 Essential (primary) hypertension: Secondary | ICD-10-CM

## 2014-05-06 DIAGNOSIS — Z9119 Patient's noncompliance with other medical treatment and regimen: Secondary | ICD-10-CM

## 2014-05-06 NOTE — Telephone Encounter (Signed)
Notified pt daughter with md response. She stated that somehow the pharmacy got the price lowered to $6, so she went ahead and got rx. She only giving him 1/2 tab because she stated in the afternoon his BS be around 99. Inform daughter will let md know & I updated med list.../lmb

## 2014-05-06 NOTE — Telephone Encounter (Signed)
Great - I prefer the Echo medically to the 2nd option! I have corrected med list to reflect use of Januvia and original glip-met  thanks

## 2014-05-07 ENCOUNTER — Telehealth: Payer: Self-pay | Admitting: Internal Medicine

## 2014-05-07 NOTE — Telephone Encounter (Signed)
Beth from [facility] called in and requesting a call back about pt --she stated in ref to deb meds  [phone]

## 2014-05-07 NOTE — Telephone Encounter (Signed)
Beth [facility]) states that pt daughter is only giving pt: gliberide bid [place] 1/2 tab daily (25 mg total).   When daughter she checks bs right after meal it is 99.  Morning reading have been low 100's

## 2014-05-08 NOTE — Telephone Encounter (Signed)
Dosing of medications noted. No treatment changes recommended. Thanks

## 2014-05-10 ENCOUNTER — Encounter: Payer: Self-pay | Admitting: *Deleted

## 2014-05-18 DIAGNOSIS — E1165 Type 2 diabetes mellitus with hyperglycemia: Secondary | ICD-10-CM

## 2014-05-18 DIAGNOSIS — N179 Acute kidney failure, unspecified: Secondary | ICD-10-CM

## 2014-05-18 DIAGNOSIS — E785 Hyperlipidemia, unspecified: Secondary | ICD-10-CM

## 2014-05-18 DIAGNOSIS — I10 Essential (primary) hypertension: Secondary | ICD-10-CM

## 2014-05-29 ENCOUNTER — Telehealth: Payer: Self-pay | Admitting: Internal Medicine

## 2014-05-29 NOTE — Telephone Encounter (Signed)
[name] with [facility] called to inform you that the patient was discharged early from home care.  He had one visit left, but his daughter did not want anymore services.  Also, the patient is only taking half of the ordered dose of Januvia because that's what the daughter think he should be taking.

## 2014-05-31 NOTE — Telephone Encounter (Signed)
Noted. Thanks for update 

## 2014-07-15 ENCOUNTER — Other Ambulatory Visit: Payer: Self-pay | Admitting: Internal Medicine

## 2014-07-25 ENCOUNTER — Ambulatory Visit (INDEPENDENT_AMBULATORY_CARE_PROVIDER_SITE_OTHER): Payer: Commercial Managed Care - HMO | Admitting: Internal Medicine

## 2014-07-25 ENCOUNTER — Encounter: Payer: Self-pay | Admitting: Internal Medicine

## 2014-07-25 VITALS — BP 140/80 | HR 92 | Temp 98.0°F | Resp 20 | Ht 71.0 in | Wt 173.0 lb

## 2014-07-25 DIAGNOSIS — E1165 Type 2 diabetes mellitus with hyperglycemia: Principal | ICD-10-CM

## 2014-07-25 DIAGNOSIS — Z23 Encounter for immunization: Secondary | ICD-10-CM

## 2014-07-25 DIAGNOSIS — E1151 Type 2 diabetes mellitus with diabetic peripheral angiopathy without gangrene: Secondary | ICD-10-CM

## 2014-07-25 DIAGNOSIS — E1159 Type 2 diabetes mellitus with other circulatory complications: Secondary | ICD-10-CM

## 2014-07-25 DIAGNOSIS — IMO0002 Reserved for concepts with insufficient information to code with codable children: Secondary | ICD-10-CM

## 2014-07-25 MED ORDER — GLYBURIDE-METFORMIN 5-500 MG PO TABS: 1.0000 | ORAL_TABLET | Freq: Two times a day (BID) | ORAL | Status: DC

## 2014-07-25 NOTE — Progress Notes (Signed)
Subjective:    Patient ID: [name], male    DOB: 1922-07-03, 80 y.o.   MRN: [id number]  HPI  Patient here for follow up Reviewed chronic medical issues and interval medical events  Past Medical History  Diagnosis Date  . Arthritis   . Type 2 diabetes mellitus   . Depression   . Dyslipidemia   . Hypertension   . CAD (coronary artery disease)     s/p DES to LAD 06/2013  . Glaucoma   . Hyperglycemia 04/17/2014    Review of Systems  Constitutional: Negative for fever and fatigue.  Respiratory: Negative for cough and shortness of breath.   Cardiovascular: Negative for chest pain and leg swelling.  Neurological: Negative for weakness.       Objective:   Physical Exam  BP 140/80 mmHg  Pulse 92  Temp(Src) 98 F (36.7 C) (Oral)  Resp 20  Ht 5\' 11"  (1.803 m)  Wt 173 lb (78.472 kg)  BMI 24.14 kg/m2  SpO2 95% Wt Readings from Last 3 Encounters:  07/25/14 173 lb (78.472 kg)  04/24/14 157 lb 12 oz (71.555 kg)  04/19/14 157 lb 13.6 oz (71.6 kg)   Constitutional: he appears well-developed and well-nourished. No distress. Dtr at side Neck: Normal range of motion. Neck supple. No JVD present. No thyromegaly present.  Cardiovascular: Normal rate, regular rhythm and normal heart sounds.  No murmur heard. No BLE edema. Pulmonary/Chest: Effort normal and breath sounds normal. No respiratory distress. he has no wheezes.  Psychiatric: he has a normal mood and affect. His behavior is normal. Judgment and thought content normal.   Lab Results  Component Value Date   WBC 3.3* 04/19/2014   HGB 11.8* 04/19/2014   HCT 34.4* 04/19/2014   PLT 170 04/19/2014   GLUCOSE 215* 04/24/2014   CHOL 159 06/22/2013   TRIG 150.0* 06/22/2013   HDL 38.10* 06/22/2013   LDLCALC 91 06/22/2013   ALT 23 04/19/2014   AST 37 04/19/2014   NA 137 04/24/2014   K 4.2 04/24/2014   CL 99 04/24/2014   CREATININE 1.2 04/24/2014   BUN 20 04/24/2014   CO2 32 04/24/2014   HGBA1C 18.4* 04/19/2014   MICROALBUR 8.5* 06/22/2013    Ct Head Wo Contrast  04/17/2014   CLINICAL DATA:  Confusion, frequent falls over 1 week  EXAM: CT HEAD WITHOUT CONTRAST  TECHNIQUE: Contiguous axial images were obtained from the base of the skull through the vertex without intravenous contrast.  COMPARISON:  None.  FINDINGS: See assessed density left frontal encephalomalacia is identified. Mild cortical volume loss noted with proportional ventricular prominence. Areas of periventricular white matter hypodensity are most compatible with small vessel ischemic change. No acute hemorrhage, infarct, or mass lesion is identified. No midline shift. No skull fracture. Soft tissue density material within the external auditory canals is most compatible with cerumen  IMPRESSION: Chronic findings as above, no acute intracranial abnormality.   Electronically Signed   By: Conchita Paris M.D.   On: 04/17/2014 20:40       Assessment & Plan:   Problem List Items Addressed This Visit    Type 2 diabetes, uncontrolled, with peripheral circulatory disorder - Primary    On metformin and OHA for years Became uncontrolled summer 2015 due to noncompliance (dtr was not supervising closely and pt stopped taking meds) Hosp for dehydration related to uncontrolled hyperglycemia 04/2014 reviewed - corrected with IVF and back on oral meds with addition of Januvia Also statin -  added  asa 81 given CAD hx (06/2013) and resumed ARB 04/2014 Check labs q39mo, titrate as needed follow up optho annually  Lab Results  Component Value Date   HGBA1C 18.4* 04/19/2014      Relevant Medications      glyBURIDE-metformin (GLUCOVANCE) 5-500 MG per tablet   Other Relevant Orders      Hemoglobin A1c      Lipid panel      Microalbumin / creatinine urine ratio      Basic metabolic panel      Ambulatory referral to Podiatry

## 2014-07-25 NOTE — Patient Instructions (Addendum)
It was good to see you today.  We have reviewed your prior records including labs and tests today  Prevnar pneumonia vaccine updated today  Test(s) ordered today. Your results will be released to [facility] (or called to you) after review, usually within 72hours after test completion. If any changes need to be made, you will be notified at that same time.  Medications reviewed and updated, no changes recommended at this time. Refill on medication(s) as discussed today.  we'll make referral to podiatry for foot check . Our office will contact you regarding appointment(s) once made.  Please schedule followup in 3-4 months, call sooner if problems.  Diabetes and Foot Care Diabetes may cause you to have problems because of poor blood supply (circulation) to your feet and legs. This may cause the skin on your feet to become thinner, break easier, and heal more slowly. Your skin may become dry, and the skin may peel and crack. You may also have nerve damage in your legs and feet causing decreased feeling in them. You may not notice minor injuries to your feet that could lead to infections or more serious problems. Taking care of your feet is one of the most important things you can do for yourself.  HOME CARE INSTRUCTIONS  Wear shoes at all times, even in the house. Do not go barefoot. Bare feet are easily injured.  Check your feet daily for blisters, cuts, and redness. If you cannot see the bottom of your feet, use a mirror or ask someone for help.  Wash your feet with warm water (do not use hot water) and mild soap. Then pat your feet and the areas between your toes until they are completely dry. Do not soak your feet as this can dry your skin.  Apply a moisturizing lotion or petroleum jelly (that does not contain alcohol and is unscented) to the skin on your feet and to dry, brittle toenails. Do not apply lotion between your toes.  Trim your toenails straight across. Do not dig under them or  around the cuticle. File the edges of your nails with an emery board or nail file.  Do not cut corns or calluses or try to remove them with medicine.  Wear clean socks or stockings every day. Make sure they are not too tight. Do not wear knee-high stockings since they may decrease blood flow to your legs.  Wear shoes that fit properly and have enough cushioning. To break in new shoes, wear them for just a few hours a day. This prevents you from injuring your feet. Always look in your shoes before you put them on to be sure there are no objects inside.  Do not cross your legs. This may decrease the blood flow to your feet.  If you find a minor scrape, cut, or break in the skin on your feet, keep it and the skin around it clean and dry. These areas may be cleansed with mild soap and water. Do not cleanse the area with peroxide, alcohol, or iodine.  When you remove an adhesive bandage, be sure not to damage the skin around it.  If you have a wound, look at it several times a day to make sure it is healing.  Do not use heating pads or hot water bottles. They may burn your skin. If you have lost feeling in your feet or legs, you may not know it is happening until it is too late.  Make sure your health care provider performs  a complete foot exam at least annually or more often if you have foot problems. Report any cuts, sores, or bruises to your health care provider immediately. SEEK MEDICAL CARE IF:   You have an injury that is not healing.  You have cuts or breaks in the skin.  You have an ingrown nail.  You notice redness on your legs or feet.  You feel burning or tingling in your legs or feet.  You have pain or cramps in your legs and feet.  Your legs or feet are numb.  Your feet always feel cold. SEEK IMMEDIATE MEDICAL CARE IF:   There is increasing redness, swelling, or pain in or around a wound.  There is a red line that goes up your leg.  Pus is coming from a wound.  You  develop a fever or as directed by your health care provider.  You notice a bad smell coming from an ulcer or wound. Document Released: 07/16/2000 Document Revised: 03/21/2013 Document Reviewed: 12/26/2012 [facility] Patient Information 2015 [place], [place]. This information is not intended to replace advice given to you by your health care provider. Make sure you discuss any questions you have with your health care provider.

## 2014-07-25 NOTE — Assessment & Plan Note (Signed)
On metformin and OHA for years Became uncontrolled summer 2015 due to noncompliance (dtr was not supervising closely and pt stopped taking meds) Hosp for dehydration related to uncontrolled hyperglycemia 04/2014 reviewed - corrected with IVF and back on oral meds with addition of Januvia Also statin -  added asa 81 given CAD hx (06/2013) and resumed ARB 04/2014 Check labs q87mo, titrate as needed follow up optho annually  Lab Results  Component Value Date   HGBA1C 18.4* 04/19/2014

## 2014-07-25 NOTE — Progress Notes (Signed)
Pre visit review using our clinic review tool, if applicable. No additional management support is needed unless otherwise documented below in the visit note. 

## 2014-07-29 ENCOUNTER — Other Ambulatory Visit (INDEPENDENT_AMBULATORY_CARE_PROVIDER_SITE_OTHER): Payer: Commercial Managed Care - HMO

## 2014-07-29 DIAGNOSIS — IMO0002 Reserved for concepts with insufficient information to code with codable children: Secondary | ICD-10-CM

## 2014-07-29 DIAGNOSIS — E1151 Type 2 diabetes mellitus with diabetic peripheral angiopathy without gangrene: Secondary | ICD-10-CM

## 2014-07-29 DIAGNOSIS — E1165 Type 2 diabetes mellitus with hyperglycemia: Principal | ICD-10-CM

## 2014-07-29 DIAGNOSIS — E1159 Type 2 diabetes mellitus with other circulatory complications: Secondary | ICD-10-CM

## 2014-07-29 LAB — MICROALBUMIN / CREATININE URINE RATIO
CREATININE, U: 176.1 mg/dL
Microalb Creat Ratio: 2.2 mg/g (ref 0.0–30.0)
Microalb, Ur: 3.9 mg/dL — ABNORMAL HIGH (ref 0.0–1.9)

## 2014-07-29 LAB — BASIC METABOLIC PANEL
BUN: 14 mg/dL (ref 6–23)
CALCIUM: 10.2 mg/dL (ref 8.4–10.5)
CO2: 33 mEq/L — ABNORMAL HIGH (ref 19–32)
Chloride: 102 mEq/L (ref 96–112)
Creatinine, Ser: 1.2 mg/dL (ref 0.4–1.5)
GFR: 72.4 mL/min (ref >60.00)
Glucose, Bld: 69 mg/dL — ABNORMAL LOW (ref 70–99)
Potassium: 4.4 mEq/L (ref 3.5–5.1)
Sodium: 143 mEq/L (ref 135–145)

## 2014-07-29 LAB — LIPID PANEL
Cholesterol: 144 mg/dL (ref 0–200)
HDL: 36.6 mg/dL — ABNORMAL LOW (ref >39.00)
LDL CALC: 83 mg/dL (ref 0–99)
NONHDL: 107.4
TRIGLYCERIDES: 121 mg/dL (ref 0.0–149.0)
Total CHOL/HDL Ratio: 4
VLDL: 24.2 mg/dL (ref 0.0–40.0)

## 2014-07-29 LAB — HEMOGLOBIN A1C: Hgb A1c MFr Bld: 6.5 % (ref 4.6–6.5)

## 2014-09-09 ENCOUNTER — Ambulatory Visit: Payer: Medicare Other | Admitting: Podiatry

## 2014-09-17 ENCOUNTER — Other Ambulatory Visit: Payer: Self-pay | Admitting: Internal Medicine

## 2014-09-26 ENCOUNTER — Other Ambulatory Visit: Payer: Self-pay

## 2014-09-26 MED ORDER — GLIMEPIRIDE 2 MG PO TABS: 2.0000 mg | ORAL_TABLET | Freq: Every day | ORAL | Status: DC

## 2014-09-26 MED ORDER — METFORMIN HCL 500 MG PO TABS: 500.0000 mg | ORAL_TABLET | Freq: Two times a day (BID) | ORAL | Status: DC

## 2014-10-07 ENCOUNTER — Ambulatory Visit: Payer: Commercial Managed Care - HMO | Admitting: Podiatry

## 2014-11-13 ENCOUNTER — Ambulatory Visit: Payer: Commercial Managed Care - HMO | Admitting: Podiatry

## 2014-11-27 ENCOUNTER — Telehealth: Payer: Self-pay | Admitting: Internal Medicine

## 2014-11-27 NOTE — Telephone Encounter (Signed)
Pt wife called in and said that pt need refill on his   Metformin and Losartan   Needs to be sent to [facility]

## 2014-11-28 MED ORDER — LOSARTAN POTASSIUM 25 MG PO TABS
25.0000 mg | ORAL_TABLET | Freq: Every day | ORAL | Status: DC
Start: 2014-11-28 — End: 2015-09-10

## 2014-11-28 MED ORDER — METFORMIN HCL 500 MG PO TABS: 500.0000 mg | ORAL_TABLET | Freq: Two times a day (BID) | ORAL | Status: DC

## 2014-11-28 NOTE — Telephone Encounter (Signed)
erx done

## 2015-01-08 ENCOUNTER — Encounter: Payer: Self-pay | Admitting: Internal Medicine

## 2015-01-08 ENCOUNTER — Other Ambulatory Visit (INDEPENDENT_AMBULATORY_CARE_PROVIDER_SITE_OTHER): Payer: Commercial Managed Care - HMO

## 2015-01-08 ENCOUNTER — Ambulatory Visit (INDEPENDENT_AMBULATORY_CARE_PROVIDER_SITE_OTHER): Payer: Commercial Managed Care - HMO | Admitting: Internal Medicine

## 2015-01-08 VITALS — BP 122/78 | HR 78 | Temp 98.0°F | Resp 16 | Wt 177.8 lb

## 2015-01-08 DIAGNOSIS — E119 Type 2 diabetes mellitus without complications: Secondary | ICD-10-CM

## 2015-01-08 DIAGNOSIS — E1159 Type 2 diabetes mellitus with other circulatory complications: Secondary | ICD-10-CM | POA: Diagnosis not present

## 2015-01-08 DIAGNOSIS — E1151 Type 2 diabetes mellitus with diabetic peripheral angiopathy without gangrene: Secondary | ICD-10-CM

## 2015-01-08 DIAGNOSIS — I10 Essential (primary) hypertension: Secondary | ICD-10-CM | POA: Diagnosis not present

## 2015-01-08 DIAGNOSIS — E785 Hyperlipidemia, unspecified: Secondary | ICD-10-CM | POA: Diagnosis not present

## 2015-01-08 DIAGNOSIS — E1165 Type 2 diabetes mellitus with hyperglycemia: Secondary | ICD-10-CM

## 2015-01-08 DIAGNOSIS — IMO0002 Reserved for concepts with insufficient information to code with codable children: Secondary | ICD-10-CM

## 2015-01-08 LAB — BASIC METABOLIC PANEL
BUN: 11 mg/dL (ref 6–23)
CALCIUM: 10.1 mg/dL (ref 8.4–10.5)
CO2: 34 meq/L — AB (ref 19–32)
CREATININE: 1.23 mg/dL (ref 0.40–1.50)
Chloride: 101 mEq/L (ref 96–112)
GFR: 70.97 mL/min (ref >60.00)
Glucose, Bld: 97 mg/dL (ref 70–99)
Potassium: 4.4 mEq/L (ref 3.5–5.1)
SODIUM: 140 meq/L (ref 135–145)

## 2015-01-08 LAB — HEMOGLOBIN A1C: Hgb A1c MFr Bld: 5.8 % (ref 4.6–6.5)

## 2015-01-08 NOTE — Patient Instructions (Signed)
We will check the blood work today and call you back with the results.   You are doing well and the blood pressure is great today.   Come back in about 6 months for your check up with Dr. Asa Lente.

## 2015-01-08 NOTE — Progress Notes (Signed)
   Subjective:    Patient ID: [name], male    DOB: 11-19-1921, 81 y.o.   MRN: [id number]  HPI The patient is a 81 YO man who is coming in for follow up of his chronic medical problems: HTN (controlled on HCTZ and losartan, mild CKD, no side effects), diabetes type 2 (complicated by peripheral circulatory disease, on metformin and januvia and glimipiride, on ARB and statin, no hypoglycemia or hyperglycemia), and hyperlipidemia (controlled on zocor, no side effects, not complicated). No new complaints.    Review of Systems  Constitutional: Negative.   Respiratory: Negative for cough, chest tightness, shortness of breath and wheezing.   Cardiovascular: Negative for chest pain, palpitations and leg swelling.  Gastrointestinal: Negative for nausea, diarrhea, constipation and abdominal distention.  Musculoskeletal: Negative.   Neurological: Negative.       Objective:   Physical Exam  Constitutional: He is oriented to person, place, and time. He appears well-developed and well-nourished.  HENT:  Head: Normocephalic and atraumatic.  Eyes: EOM are normal.  Neck: Normal range of motion.  Cardiovascular: Normal rate and regular rhythm.   Pulmonary/Chest: Effort normal and breath sounds normal. No respiratory distress. He has no wheezes. He has no rales.  Abdominal: Soft. He exhibits no distension. There is no tenderness. There is no rebound.  Musculoskeletal: He exhibits no edema.  Neurological: He is alert and oriented to person, place, and time. Coordination normal.  Skin: Skin is warm and dry.  Psychiatric: He has a normal mood and affect.   Filed Vitals:   01/08/15 0807  BP: 122/78  Pulse: 78  Temp: 98 F (36.7 C)  TempSrc: Oral  Resp: 16  Weight: 177 lb 12.8 oz (80.65 kg)  SpO2: 95%      Assessment & Plan:

## 2015-01-08 NOTE — Assessment & Plan Note (Signed)
BP controlled on HCTZ and losartan. Checking BMP today. Continue unless indication for change.

## 2015-01-08 NOTE — Assessment & Plan Note (Addendum)
Control was much improved at last visit. On same regimen of metformin, glimipiride, januvia. If HgA1c still <7 could consider decreasing therapy. He is on ARB and statin. Checking BMP as well. No hypoglycemia at this time. Eye and foot exam current. Check HgA1c today.

## 2015-01-08 NOTE — Assessment & Plan Note (Signed)
Continue simvastatin and LDL at goal on most recent LDL. Repeat lipid panel at next visit.

## 2015-01-08 NOTE — Progress Notes (Signed)
Pre visit review using our clinic review tool, if applicable. No additional management support is needed unless otherwise documented below in the visit note. 

## 2015-01-16 ENCOUNTER — Other Ambulatory Visit: Payer: Self-pay

## 2015-01-16 ENCOUNTER — Telehealth: Payer: Self-pay | Admitting: Internal Medicine

## 2015-01-16 NOTE — Telephone Encounter (Signed)
dtr explained that sugar were good for her dad that he could stop a med. I looked at the lab results and Amy had called with the lab results.  Glimiperide was the name of the med.  Dtr is aware.   This med has been deleted from pt med list with the appropriate reason.

## 2015-01-16 NOTE — Telephone Encounter (Signed)
Called dtr

## 2015-01-16 NOTE — Telephone Encounter (Signed)
Daughter states that she had a phone call from someone yesterday to discontinue a med for her dad.  She has forgotten what med that is.  She is requesting a call before 12 if possible to let her know.

## 2015-02-11 ENCOUNTER — Telehealth: Payer: Self-pay | Admitting: Internal Medicine

## 2015-02-24 ENCOUNTER — Telehealth: Payer: Self-pay | Admitting: Internal Medicine

## 2015-02-24 DIAGNOSIS — I25119 Atherosclerotic heart disease of native coronary artery with unspecified angina pectoris: Secondary | ICD-10-CM

## 2015-02-24 DIAGNOSIS — I10 Essential (primary) hypertension: Secondary | ICD-10-CM

## 2015-02-24 NOTE — Telephone Encounter (Signed)
LVM for pt to call back as soon as possible.   

## 2015-02-24 NOTE — Telephone Encounter (Signed)
Patient's daughter is calling about patient needing a referral to cardiology. Patient has seen someone before at [facility], but not sure who. Please advise patient

## 2015-03-05 ENCOUNTER — Encounter: Payer: Self-pay | Admitting: Internal Medicine

## 2015-04-01 ENCOUNTER — Ambulatory Visit: Payer: Commercial Managed Care - HMO | Admitting: Cardiology

## 2015-04-21 ENCOUNTER — Ambulatory Visit: Payer: Commercial Managed Care - HMO | Admitting: Cardiovascular Disease

## 2015-05-07 ENCOUNTER — Other Ambulatory Visit: Payer: Self-pay | Admitting: Internal Medicine

## 2015-05-16 ENCOUNTER — Ambulatory Visit: Payer: Commercial Managed Care - HMO | Admitting: Cardiology

## 2015-05-30 ENCOUNTER — Ambulatory Visit (INDEPENDENT_AMBULATORY_CARE_PROVIDER_SITE_OTHER): Payer: Commercial Managed Care - HMO

## 2015-05-30 DIAGNOSIS — Z23 Encounter for immunization: Secondary | ICD-10-CM | POA: Diagnosis not present

## 2015-07-10 ENCOUNTER — Other Ambulatory Visit (INDEPENDENT_AMBULATORY_CARE_PROVIDER_SITE_OTHER): Payer: Commercial Managed Care - HMO

## 2015-07-10 ENCOUNTER — Encounter: Payer: Self-pay | Admitting: Internal Medicine

## 2015-07-10 ENCOUNTER — Ambulatory Visit (INDEPENDENT_AMBULATORY_CARE_PROVIDER_SITE_OTHER): Payer: Commercial Managed Care - HMO | Admitting: Internal Medicine

## 2015-07-10 VITALS — BP 150/80 | HR 80 | Temp 97.7°F | Resp 16 | Ht 71.0 in | Wt 173.0 lb

## 2015-07-10 DIAGNOSIS — E119 Type 2 diabetes mellitus without complications: Secondary | ICD-10-CM | POA: Diagnosis not present

## 2015-07-10 DIAGNOSIS — E785 Hyperlipidemia, unspecified: Secondary | ICD-10-CM

## 2015-07-10 DIAGNOSIS — E1151 Type 2 diabetes mellitus with diabetic peripheral angiopathy without gangrene: Secondary | ICD-10-CM

## 2015-07-10 DIAGNOSIS — I10 Essential (primary) hypertension: Secondary | ICD-10-CM | POA: Diagnosis not present

## 2015-07-10 LAB — BASIC METABOLIC PANEL
BUN: 13 mg/dL (ref 6–23)
CHLORIDE: 100 meq/L (ref 96–112)
CO2: 35 meq/L — AB (ref 19–32)
CREATININE: 1.17 mg/dL (ref 0.40–1.50)
Calcium: 10.1 mg/dL (ref 8.4–10.5)
GFR: 75.11 mL/min (ref >60.00)
Glucose, Bld: 157 mg/dL — ABNORMAL HIGH (ref 70–99)
Potassium: 5.4 mEq/L — ABNORMAL HIGH (ref 3.5–5.1)
Sodium: 141 mEq/L (ref 135–145)

## 2015-07-10 LAB — HEMOGLOBIN A1C: Hgb A1c MFr Bld: 6.8 % — ABNORMAL HIGH (ref 4.6–6.5)

## 2015-07-10 NOTE — Progress Notes (Signed)
Pre visit review using our clinic review tool, if applicable. No additional management support is needed unless otherwise documented below in the visit note. 

## 2015-07-10 NOTE — Assessment & Plan Note (Signed)
Is on ARB, foot exam done today, Last HgA1c was 5.8 and if still <7 will stop [place] today and decrease metformin to daily only. At this time low sugars are more risk than high sugars.

## 2015-07-10 NOTE — Patient Instructions (Addendum)
We will check the labs today and will likely stop the [place]. We will likely also change the metformin to once a day instead of twice a day.   Come back in about 6 months for a physical.  Memory Compensation Strategies  1. Use "WARM" strategy.  W= write it down  A= associate it  R= repeat it  M= make a mental note  2.   You can keep a Social worker.  Use a 3-ring notebook with sections for the following: calendar, important names and phone numbers,  medications, doctors' names/phone numbers, lists/reminders, and a section to journal what you did  each day.   3.    Use a calendar to write appointments down.  4.    Write yourself a schedule for the day.  This can be placed on the calendar or in a separate section of the Memory Notebook.  Keeping a  regular schedule can help memory.  5.    Use medication organizer with sections for each day or morning/evening pills.  You may need help loading it  6.    Keep a basket, or pegboard by the door.  Place items that you need to take out with you in the basket or on the pegboard.  You may also want to  include a message board for reminders.  7.    Use sticky notes.  Place sticky notes with reminders in a place where the task is performed.  For example: " turn off the  stove" placed by the stove, "lock the door" placed on the door at eye level, " take your medications" on  the bathroom mirror or by the place where you normally take your medications.  8.    Use alarms/timers.  Use while cooking to remind yourself to check on food or as a reminder to take your medicine, or as a  reminder to make a call, or as a reminder to perform another task, etc.

## 2015-07-10 NOTE — Assessment & Plan Note (Signed)
Will not continue checking lipid panel, at this time since no side effects from the statin will continue.

## 2015-07-10 NOTE — Assessment & Plan Note (Signed)
BP at goal on recheck with HCTZ and losartan. Checking BMP and adjust as needed.

## 2015-07-10 NOTE — Progress Notes (Signed)
   Subjective:    Patient ID: [name], male    DOB: [birth year], 81 y.o.   MRN: [id number]  HPI The patient is a 81 YO man coming in for follow up of his diabetes. He is doing well and no chest pains (previous CAD associated with his diabetes). No claudication. No numbness or tingling in his feet. No low sugars. Did stop the glimepiride after last visit without problems. Still taking januvia and metformin. Not eating as much. His daughter is with him and has some concerns about his memory.   Review of Systems  Constitutional: Negative.   HENT: Negative.   Respiratory: Negative for cough, chest tightness, shortness of breath and wheezing.   Cardiovascular: Negative for chest pain, palpitations and leg swelling.  Gastrointestinal: Negative for nausea, diarrhea, constipation and abdominal distention.  Musculoskeletal: Negative.   Skin: Negative.   Neurological: Negative.       Objective:   Physical Exam  Constitutional: He is oriented to person, place, and time. He appears well-developed and well-nourished.  HENT:  Head: Normocephalic and atraumatic.  Eyes: EOM are normal.  Neck: Normal range of motion.  Cardiovascular: Normal rate and regular rhythm.   Pulmonary/Chest: Effort normal and breath sounds normal. No respiratory distress. He has no wheezes. He has no rales.  Abdominal: Soft. He exhibits no distension. There is no tenderness. There is no rebound.  Musculoskeletal: He exhibits no edema.  Neurological: He is alert and oriented to person, place, and time. Coordination normal.  Skin: Skin is warm and dry.  See foot exam  Psychiatric: He has a normal mood and affect.   Filed Vitals:   07/10/15 0823  BP: 150/80  Pulse: 80  Temp: 97.7 F (36.5 C)  TempSrc: Oral  Resp: 16  Height: 5\' 11"  (1.803 m)  Weight: 173 lb (78.472 kg)  SpO2: 95%      Assessment & Plan:

## 2015-07-11 ENCOUNTER — Other Ambulatory Visit: Payer: Self-pay | Admitting: Internal Medicine

## 2015-07-11 DIAGNOSIS — I10 Essential (primary) hypertension: Secondary | ICD-10-CM

## 2015-07-17 ENCOUNTER — Ambulatory Visit: Payer: Commercial Managed Care - HMO | Admitting: Cardiology

## 2015-07-29 ENCOUNTER — Other Ambulatory Visit (INDEPENDENT_AMBULATORY_CARE_PROVIDER_SITE_OTHER): Payer: Commercial Managed Care - HMO

## 2015-07-29 DIAGNOSIS — I10 Essential (primary) hypertension: Secondary | ICD-10-CM

## 2015-07-29 LAB — BASIC METABOLIC PANEL
BUN: 17 mg/dL (ref 6–23)
CO2: 33 mEq/L — ABNORMAL HIGH (ref 19–32)
CREATININE: 1.38 mg/dL (ref 0.40–1.50)
Calcium: 10.4 mg/dL (ref 8.4–10.5)
Chloride: 101 mEq/L (ref 96–112)
GFR: 62.07 mL/min (ref >60.00)
GLUCOSE: 143 mg/dL — AB (ref 70–99)
POTASSIUM: 4.4 meq/L (ref 3.5–5.1)
Sodium: 141 mEq/L (ref 135–145)

## 2015-07-30 ENCOUNTER — Emergency Department (HOSPITAL_COMMUNITY): Payer: Commercial Managed Care - HMO

## 2015-07-30 ENCOUNTER — Emergency Department (HOSPITAL_COMMUNITY)
Admission: EM | Admit: 2015-07-30 | Discharge: 2015-07-30 | Disposition: A | Discharge status: Home / Self Care | Payer: Commercial Managed Care - HMO | Attending: Emergency Medicine | Admitting: Emergency Medicine

## 2015-07-30 ENCOUNTER — Encounter (HOSPITAL_COMMUNITY): Payer: Self-pay

## 2015-07-30 DIAGNOSIS — S0990XA Unspecified injury of head, initial encounter: Secondary | ICD-10-CM | POA: Diagnosis not present

## 2015-07-30 DIAGNOSIS — Y998 Other external cause status: Secondary | ICD-10-CM | POA: Insufficient documentation

## 2015-07-30 DIAGNOSIS — Z7984 Long term (current) use of oral hypoglycemic drugs: Secondary | ICD-10-CM | POA: Insufficient documentation

## 2015-07-30 DIAGNOSIS — M1990 Unspecified osteoarthritis, unspecified site: Secondary | ICD-10-CM | POA: Diagnosis not present

## 2015-07-30 DIAGNOSIS — Z7982 Long term (current) use of aspirin: Secondary | ICD-10-CM | POA: Insufficient documentation

## 2015-07-30 DIAGNOSIS — S060X0A Concussion without loss of consciousness, initial encounter: Secondary | ICD-10-CM | POA: Insufficient documentation

## 2015-07-30 DIAGNOSIS — Z23 Encounter for immunization: Secondary | ICD-10-CM | POA: Diagnosis not present

## 2015-07-30 DIAGNOSIS — Z8669 Personal history of other diseases of the nervous system and sense organs: Secondary | ICD-10-CM | POA: Diagnosis not present

## 2015-07-30 DIAGNOSIS — Z8659 Personal history of other mental and behavioral disorders: Secondary | ICD-10-CM | POA: Insufficient documentation

## 2015-07-30 DIAGNOSIS — E119 Type 2 diabetes mellitus without complications: Secondary | ICD-10-CM | POA: Diagnosis not present

## 2015-07-30 DIAGNOSIS — I10 Essential (primary) hypertension: Secondary | ICD-10-CM | POA: Insufficient documentation

## 2015-07-30 DIAGNOSIS — Y9289 Other specified places as the place of occurrence of the external cause: Secondary | ICD-10-CM | POA: Diagnosis not present

## 2015-07-30 DIAGNOSIS — Y9389 Activity, other specified: Secondary | ICD-10-CM | POA: Insufficient documentation

## 2015-07-30 DIAGNOSIS — Z9889 Other specified postprocedural states: Secondary | ICD-10-CM | POA: Diagnosis not present

## 2015-07-30 DIAGNOSIS — T148 Other injury of unspecified body region: Secondary | ICD-10-CM | POA: Diagnosis not present

## 2015-07-30 DIAGNOSIS — E785 Hyperlipidemia, unspecified: Secondary | ICD-10-CM | POA: Diagnosis not present

## 2015-07-30 DIAGNOSIS — W1839XA Other fall on same level, initial encounter: Secondary | ICD-10-CM | POA: Diagnosis not present

## 2015-07-30 DIAGNOSIS — Z79899 Other long term (current) drug therapy: Secondary | ICD-10-CM | POA: Insufficient documentation

## 2015-07-30 DIAGNOSIS — S0181XA Laceration without foreign body of other part of head, initial encounter: Secondary | ICD-10-CM | POA: Diagnosis not present

## 2015-07-30 DIAGNOSIS — I2510 Atherosclerotic heart disease of native coronary artery without angina pectoris: Secondary | ICD-10-CM | POA: Diagnosis not present

## 2015-07-30 DIAGNOSIS — Z87891 Personal history of nicotine dependence: Secondary | ICD-10-CM | POA: Diagnosis not present

## 2015-07-30 MED ORDER — LIDOCAINE HCL 2 % IJ SOLN
20.0000 mL | Freq: Once | INTRAMUSCULAR | Status: AC
  Administered 2015-07-30: 400 mg via INTRADERMAL
  Filled 2015-07-30: qty 20

## 2015-07-30 MED ORDER — LIDOCAINE-EPINEPHRINE-TETRACAINE (LET) SOLUTION
3.0000 mL | Freq: Once | NASAL | Status: AC
  Administered 2015-07-30: 3 mL via TOPICAL
  Filled 2015-07-30: qty 3

## 2015-07-30 MED ORDER — TETANUS-DIPHTH-ACELL PERTUSSIS 5-2.5-18.5 LF-MCG/0.5 IM SUSP
0.5000 mL | Freq: Once | INTRAMUSCULAR | Status: AC
  Administered 2015-07-30: 0.5 mL via INTRAMUSCULAR
  Filled 2015-07-30: qty 0.5

## 2015-07-30 MED ORDER — TETANUS-DIPHTH-ACELL PERTUSSIS 5-2.5-18.5 LF-MCG/0.5 IM SUSP
0.5000 mL | Freq: Once | INTRAMUSCULAR | Status: DC
  Filled 2015-07-30: qty 0.5

## 2015-07-30 NOTE — ED Notes (Signed)
Bacitracin and bandage applied to pts laceration. Pt in position of comfort waiting discharge. Family bedside.

## 2015-07-30 NOTE — Discharge Instructions (Signed)
Head Injury, Adult You have received a head injury. It does not appear serious at this time. Headaches and vomiting are common following head injury. It should be easy to awaken from sleeping. Sometimes it is necessary for you to stay in the emergency department for a while for observation. Sometimes admission to the hospital may be needed. After injuries such as yours, most problems occur within the first 24 hours, but side effects may occur up to 7-10 days after the injury. It is important for you to carefully monitor your condition and contact your health care provider or seek immediate medical care if there is a change in your condition. WHAT ARE THE TYPES OF HEAD INJURIES? Head injuries can be as minor as a bump. Some head injuries can be more severe. More severe head injuries include:  A jarring injury to the brain (concussion).  A bruise of the brain (contusion). This mean there is bleeding in the brain that can cause swelling.  A cracked skull (skull fracture).  Bleeding in the brain that collects, clots, and forms a bump (hematoma). WHAT CAUSES A HEAD INJURY? A serious head injury is most likely to happen to someone who is in a car wreck and is not wearing a seat belt. Other causes of major head injuries include bicycle or motorcycle accidents, sports injuries, and falls. HOW ARE HEAD INJURIES DIAGNOSED? A complete history of the event leading to the injury and your current symptoms will be helpful in diagnosing head injuries. Many times, pictures of the brain, such as CT or MRI are needed to see the extent of the injury. Often, an overnight hospital stay is necessary for observation.  WHEN SHOULD I SEEK IMMEDIATE MEDICAL CARE?  You should get help right away if:  You have confusion or drowsiness.  You feel sick to your stomach (nauseous) or have continued, forceful vomiting.  You have dizziness or unsteadiness that is getting worse.  You have severe, continued headaches not  relieved by medicine. Only take over-the-counter or prescription medicines for pain, fever, or discomfort as directed by your health care provider.  You do not have normal function of the arms or legs or are unable to walk.  You notice changes in the black spots in the center of the colored part of your eye (pupil).  You have a clear or bloody fluid coming from your nose or ears.  You have a loss of vision. During the next 24 hours after the injury, you must stay with someone who can watch you for the warning signs. This person should contact local emergency services (911 in the [place] if you have seizures, you become unconscious, or you are unable to wake up. HOW CAN I PREVENT A HEAD INJURY IN THE FUTURE? The most important factor for preventing major head injuries is avoiding motor vehicle accidents. To minimize the potential for damage to your head, it is crucial to wear seat belts while riding in motor vehicles. Wearing helmets while bike riding and playing collision sports (like football) is also helpful. Also, avoiding dangerous activities around the house will further help reduce your risk of head injury.  WHEN CAN I RETURN TO NORMAL ACTIVITIES AND ATHLETICS? You should be reevaluated by your health care provider before returning to these activities. If you have any of the following symptoms, you should not return to activities or contact sports until 1 week after the symptoms have stopped:  Persistent headache.  Dizziness or vertigo.  Poor attention and concentration.  Confusion.  activities or contact sports until 1 week after the symptoms have stopped:   Persistent headache.   Dizziness or vertigo.   Poor attention and concentration.   Confusion.   Memory problems.   Nausea or vomiting.   Fatigue or tire easily.   Irritability.   Intolerant of bright lights or loud noises.   Anxiety or depression.   Disturbed sleep.  MAKE SURE YOU:    Understand these instructions.   Will watch your condition.   Will get help right away if you are not doing well or get worse.     This information is not intended to replace advice given to you by your health  care provider. Make sure you discuss any questions you have with your health care provider.     Document Released: 07/19/2005 Document Revised: 08/09/2014 Document Reviewed: 03/26/2013  Elsevier Interactive Patient Education 2016 [employer]    Facial Laceration   A facial laceration is a cut on the face. These injuries can be painful and cause bleeding. Lacerations usually heal quickly, but they need special care to reduce scarring.  DIAGNOSIS   Your health care provider will take a medical history, ask for details about how the injury occurred, and examine the wound to determine how deep the cut is.  TREATMENT   Some facial lacerations may not require closure. Others may not be able to be closed because of an increased risk of infection. The risk of infection and the chance for successful closure will depend on various factors, including the amount of time since the injury occurred.  The wound may be cleaned to help prevent infection. If closure is appropriate, pain medicines may be given if needed. Your health care provider will use stitches (sutures), wound glue (adhesive), or skin adhesive strips to repair the laceration. These tools bring the skin edges together to allow for faster healing and a better cosmetic outcome. If needed, you may also be given a tetanus shot.  HOME CARE INSTRUCTIONS   Only take over-the-counter or prescription medicines as directed by your health care provider.   Follow your health care provider's instructions for wound care. These instructions will vary depending on the technique used for closing the wound.  For Sutures:   Keep the wound clean and dry.    If you were given a bandage (dressing), you should change it at least once a day. Also change the dressing if it becomes wet or dirty, or as directed by your health care provider.    Wash the wound with soap and water 2 times a day. Rinse the wound off with water to remove all soap. Pat the wound dry with a clean towel.     After cleaning, apply a thin layer of the antibiotic ointment recommended by your health care provider. This will help prevent infection and keep the dressing from sticking.    You may shower as usual after the first 24 hours. Do not soak the wound in water until the sutures are removed.    Get your sutures removed as directed by your health care provider. With facial lacerations, sutures should usually be taken out after 4-5 days to avoid stitch marks.    Wait a few days after your sutures are removed before applying any makeup.  For Skin Adhesive Strips:   Keep the wound clean and dry.    Do not get the skin adhesive strips wet. You may bathe carefully, using caution to keep the wound dry.      If the wound gets wet, pat it dry with a clean towel.    Skin adhesive strips will fall off on their own. You may trim the strips as the wound heals. Do not remove skin adhesive strips that are still stuck to the wound. They will fall off in time.   For Wound Adhesive:   You may briefly wet your wound in the shower or bath. Do not soak or scrub the wound. Do not swim. Avoid periods of heavy sweating until the skin adhesive has fallen off on its own. After showering or bathing, gently pat the wound dry with a clean towel.    Do not apply liquid medicine, cream medicine, ointment medicine, or makeup to your wound while the skin adhesive is in place. This may loosen the film before your wound is healed.    If a dressing is placed over the wound, be careful not to apply tape directly over the skin adhesive. This may cause the adhesive to be pulled off before the wound is healed.    Avoid prolonged exposure to sunlight or tanning lamps while the skin adhesive is in place.   The skin adhesive will usually remain in place for 5-10 days, then naturally fall off the skin. Do not pick at the adhesive film.   After Healing:  Once the wound has healed, cover the wound with sunscreen during the day for 1 full  year. This can help minimize scarring. Exposure to ultraviolet light in the first year will darken the scar. It can take 1-2 years for the scar to lose its redness and to heal completely.   SEEK MEDICAL CARE IF:   You have a fever.  SEEK IMMEDIATE MEDICAL CARE IF:   You have redness, pain, or swelling around the wound.    You see ayellowish-white fluid (pus) coming from the wound.      This information is not intended to replace advice given to you by your health care provider. Make sure you discuss any questions you have with your health care provider.     Document Released: 08/26/2004 Document Revised: 08/09/2014 Document Reviewed: 03/01/2013  Elsevier Interactive Patient Education 2016 [employer]

## 2015-07-30 NOTE — ED Notes (Signed)
Pt arrived via [facility] from [facility] shop c/o fall with abrasion to forehead and chin laceration.  Pt alert, denies any pain.

## 2015-07-30 NOTE — ED Notes (Signed)
Suture cart at bedside 

## 2015-07-30 NOTE — ED Provider Notes (Signed)
CSN: [id number]     Arrival date & time 07/30/15  1610 History   First MD Initiated Contact with Patient 07/30/15 1628     Chief Complaint  Patient presents with  . Fall     Patient is a 81 y.o. male presenting with fall. The history is provided by the patient and a relative.  Fall This is a new problem. The current episode started less than 1 hour ago. The problem has not changed since onset.Pertinent negatives include no chest pain, no abdominal pain, no headaches and no shortness of breath. The symptoms are aggravated by walking. The symptoms are relieved by rest.  Patient presents after fall Pt was walking to barbershop when he fell to ground He is unsure if he tripped but he reports he has no dizziness/weakness/cp/sob No HA No LOC No neck or back pain He has pain to chin with laceration He also has abrasion to forehead He is not on anticoagulants  Past Medical History  Diagnosis Date  . Arthritis   . Type 2 diabetes mellitus ([place])   . Depression   . Dyslipidemia   . Hypertension   . CAD (coronary artery disease)     s/p DES to LAD 06/2013  . Glaucoma   . Hyperglycemia 04/17/2014   Past Surgical History  Procedure Laterality Date  . Cardiac catheterization  06/2013    DES to LAD by Dr Tamala Julian  . Right cataract     Family History  Problem Relation Age of Onset  . Arthritis Mother   . Breast cancer Mother   . Arthritis Father   . Diabetes Other   . Arthritis Other   . Colon cancer Other   . Stroke Other    Social History  Substance Use Topics  . Smoking status: Former Smoker    Types: Cigarettes    Quit date: 08/02/1950  . Smokeless tobacco: Never Used  . Alcohol Use: No    Review of Systems  Constitutional: Negative for fever.  Respiratory: Negative for shortness of breath.   Cardiovascular: Negative for chest pain.  Gastrointestinal: Negative for abdominal pain.  Skin: Positive for wound.  Neurological: Negative for headaches.  All other systems  reviewed and are negative.     Allergies  Review of patient's allergies indicates no known allergies.  Home Medications   Prior to Admission medications   Medication Sig Start Date End Date Taking? Authorizing Provider  aspirin EC 81 MG tablet Take 1 tablet (81 mg total) by mouth daily. 06/22/13   Rowe Clack, MD  Blood Glucose Monitoring Suppl (PRODIGY AUTOCODE BLOOD GLUCOSE) W/DEVICE KIT Use as directed Patient not taking: Reported on 07/10/2015 12/13/13   Hendricks Limes, MD  Glucose Blood (PRODIGY BLOOD GLUCOSE TEST VI) by In Vitro route. Use to check blood sugars twice a day Dx 250.00    Historical Provider, MD  glucose blood test strip 1 each by Other route 2 (two) times daily. Use to check blood sugar twice a day Dx 250.00 Patient not taking: Reported on 07/10/2015 05/03/14   Rowe Clack, MD  hydrochlorothiazide (HYDRODIURIL) 25 MG tablet TAKE 1 TABLET EVERY DAY 09/17/14   Rowe Clack, MD  JANUVIA 50 MG tablet TAKE 1 TABLET EVERY DAY 05/07/15   Rowe Clack, MD  Loratadine 10 MG CAPS Take 1 capsule by mouth daily.    Historical Provider, MD  losartan (COZAAR) 25 MG tablet Take 1 tablet (25 mg total) by mouth daily. 11/28/14  Rowe Clack, MD  metFORMIN (GLUCOPHAGE) 500 MG tablet Take 1 tablet (500 mg total) by mouth 2 (two) times daily with a meal. 11/28/14   Rowe Clack, MD  Multiple Vitamin (MULTIVITAMIN WITH MINERALS) TABS tablet Take 1 tablet by mouth daily.    Historical Provider, MD  PRODIGY TWIST TOP LANCETS 28G MISC 1 each by Does not apply route daily. Patient not taking: Reported on 07/10/2015 12/13/13   Hendricks Limes, MD  simvastatin (ZOCOR) 20 MG tablet TAKE 1 TABLET EVERY DAY 09/17/14   Rowe Clack, MD   BP 171/90 mmHg  Pulse 88  Temp(Src) 98.3 F (36.8 C) (Oral)  Resp 20  SpO2 95% Physical Exam CONSTITUTIONAL: Well developed/well nourished HEAD: small abrasion to forehead, otherwise Normocephalic/atraumatic EYES:  EOMI/PERRL ENMT: Mucous membranes moist, laceration to chin.  He has no evidence of mandible injury, no nasal septal hematoma, no facial tenderness, no stepoffs or crepitus noted NECK: supple no meningeal signs SPINE/BACK:entire spine nontender CV: S1/S2 noted, no murmurs/rubs/gallops noted LUNGS: Lungs are clear to auscultation bilaterally, no apparent distress ABDOMEN: soft, nontender NEURO: Pt is awake/alert/appropriate, moves all extremitiesx4.  No facial droop.   EXTREMITIES: pulses normal/equal, full ROM, All extremities/joints palpated/ranged and nontender SKIN: warm, color normal, laceration to chin PSYCH: no abnormalities of mood noted, alert and oriented to situation  ED Course  Procedures   LACERATION REPAIR Performed by: Sharyon Cable Consent: Verbal consent obtained. Risks and benefits: risks, benefits and alternatives were discussed Patient identity confirmed: provided demographic data Time out performed prior to procedure Prepped and Draped in normal sterile fashion Wound explored Laceration Location: chin Laceration Length: 2cm No Foreign Bodies seen or palpated Anesthesia: local infiltration Local anesthetic: lidocaine % without epinephrine Anesthetic total: 4 ml Amount of cleaning: standard Skin closure: simple Number of sutures or staples: 4 vicryl Technique: simple interrupted Patient tolerance: Patient tolerated the procedure well with no immediate complications.  Imaging Review Ct Head Wo Contrast  07/30/2015  CLINICAL DATA:  81 year old male with fall and trauma to the right side of the head go to EXAM: CT HEAD WITHOUT CONTRAST TECHNIQUE: Contiguous axial images were obtained from the base of the skull through the vertex without intravenous contrast. COMPARISON:  CT dated 04/17/2014 FINDINGS: There is stable prominence of the ventricles and sulci compatible with age-related atrophy. Periventricular and deep white matter hypodensities represent chronic  microvascular ischemic changes. Stable old left frontal infarct and encephalomalacia. There is no intracranial hemorrhage. No mass effect or midline shift identified. The visualized paranasal sinuses and mastoid air cells are well aerated. The calvarium is intact. Small right forehead hematoma. IMPRESSION: No acute intracranial hemorrhage. Age-related atrophy and chronic microvascular ischemic disease. Electronically Signed   By: Anner Crete M.D.   On: 07/30/2015 18:16   Pt with probable mechanical fall He has no complaints CT head negative He ambulated in room Discussed return precautions Pt stays with daughter  MDM   Final diagnoses:  Concussion, without loss of consciousness, initial encounter  Laceration of chin, initial encounter    Nursing notes including past medical history and social history reviewed and considered in documentation     Ripley Fraise, MD 07/30/15 Bosie Helper

## 2015-07-30 NOTE — ED Notes (Signed)
Pt stable, ambulatory, states understanding of discharge instructions 

## 2015-08-20 ENCOUNTER — Ambulatory Visit (INDEPENDENT_AMBULATORY_CARE_PROVIDER_SITE_OTHER): Payer: Commercial Managed Care - HMO | Admitting: Family

## 2015-08-20 ENCOUNTER — Encounter: Payer: Self-pay | Admitting: Family

## 2015-08-20 VITALS — BP 152/88 | HR 73 | Temp 97.4°F | Resp 18 | Ht 71.0 in | Wt 173.0 lb

## 2015-08-20 DIAGNOSIS — S0181XA Laceration without foreign body of other part of head, initial encounter: Secondary | ICD-10-CM | POA: Insufficient documentation

## 2015-08-20 DIAGNOSIS — R413 Other amnesia: Secondary | ICD-10-CM

## 2015-08-20 DIAGNOSIS — S0181XD Laceration without foreign body of other part of head, subsequent encounter: Secondary | ICD-10-CM | POA: Diagnosis not present

## 2015-08-20 NOTE — Assessment & Plan Note (Addendum)
MMSE score of 14/30 consistent with moderate degree of impairment with clear deficits. Recommend further evaluation and assessment with neurology. Concern for worsening impairment with possible need for increased supervision and assistance with co-morbid condition management while increased concern for overall safety. Consider PACE program while family is at work. Refer to Geriatrics per patient request. There is also possilbity of underlying depression given tearful mood. Follow up if symptoms worsen prior to referrals.

## 2015-08-20 NOTE — Progress Notes (Signed)
Pre visit review using our clinic review tool, if applicable. No additional management support is needed unless otherwise documented below in the visit note. 

## 2015-08-20 NOTE — Assessment & Plan Note (Signed)
Chin laceration with dissolvable sutures appears with good approximation and healing well with no evidence of infection. Continue basic wound care for the next week and may begin shaving with the next week pending healing. No further intervention is needed currently.

## 2015-08-20 NOTE — Progress Notes (Signed)
Subjective:    Patient ID: [name], male    DOB: 30-Aug-1921, 82 y.o.   MRN: [id number]  Chief Complaint  Patient presents with  . Fall    had a fall in decemeber and had to have stitches in his chin, daughter wants that checked and also wants to talk about home health    HPI:  [name] is a 82 y.o. male who  has a past medical history of Arthritis; Type 2 diabetes mellitus ([place]); Depression; Dyslipidemia; Hypertension; CAD (coronary artery disease); Glaucoma; and Hyperglycemia (04/17/2014). and presents today for an office follow-up.   1.) Chin laceration - recently seen in the emergency department following a fall with no headaches, or loss of consciousness. He did sustain a laceration to his chin which was repaired with sutures that were dissolvable. Denies any symptoms of infection or wound opening. Would like to have the wound checked because he is interested in shaving.  2.) Memory issues - Associated symptom of decreased memory has been gong on for the past several months to a year and has been progressively worsening. Daughter describes that she prepares meals for him to eat and occasionally will not eat the meal that she has left and will consume other foods if he eats anything at all. Indicates that he does become frustrated lately when he is not able to remember things. He does also experience episodes of crying.  Would like to explore the possibility of home health.   No Known Allergies   Current Outpatient Prescriptions on File Prior to Visit  Medication Sig Dispense Refill  . aspirin EC 81 MG tablet Take 1 tablet (81 mg total) by mouth daily. 150 tablet 2  . Blood Glucose Monitoring Suppl (PRODIGY AUTOCODE BLOOD GLUCOSE) W/DEVICE KIT Use as directed 1 each 0  . Glucose Blood (PRODIGY BLOOD GLUCOSE TEST VI) by In Vitro route. Use to check blood sugars twice a day Dx 250.00    . glucose blood test strip 1 each by Other route 2 (two) times daily. Use to check blood  sugar twice a day Dx 250.00 180 each 3  . hydrochlorothiazide (HYDRODIURIL) 25 MG tablet TAKE 1 TABLET EVERY DAY 90 tablet 3  . JANUVIA 50 MG tablet TAKE 1 TABLET EVERY DAY 90 tablet 1  . Loratadine 10 MG CAPS Take 1 capsule by mouth daily.    [name] losartan (COZAAR) 25 MG tablet Take 1 tablet (25 mg total) by mouth daily. 90 tablet 3  . metFORMIN (GLUCOPHAGE) 500 MG tablet Take 1 tablet (500 mg total) by mouth 2 (two) times daily with a meal. 180 tablet 3  . Multiple Vitamin (MULTIVITAMIN WITH MINERALS) TABS tablet Take 1 tablet by mouth daily.    [name] PRODIGY TWIST TOP LANCETS 28G MISC 1 each by Does not apply route daily. 1 each 2  . simvastatin (ZOCOR) 20 MG tablet TAKE 1 TABLET EVERY DAY 90 tablet 3   No current facility-administered medications on file prior to visit.     Past Surgical History  Procedure Laterality Date  . Cardiac catheterization  06/2013    DES to LAD by Dr Tamala Julian  . Right cataract      Past Medical History  Diagnosis Date  . Arthritis   . Type 2 diabetes mellitus ([place])   . Depression   . Dyslipidemia   . Hypertension   . CAD (coronary artery disease)     s/p DES to LAD 06/2013  . Glaucoma   . Hyperglycemia  04/17/2014     Review of Systems  Constitutional: Negative for fever and chills.  Skin: Negative for rash.  Psychiatric/Behavioral: Positive for confusion, dysphoric mood and decreased concentration. The patient is nervous/anxious.       Objective:    BP 152/88 mmHg  Pulse 73  Temp(Src) 97.4 F (36.3 C) (Oral)  Resp 18  Ht '5\' 11"'  (1.803 m)  Wt 173 lb (78.472 kg)  BMI 24.14 kg/m2  SpO2 95% Nursing note and vital signs reviewed.  MMSE - Mini Mental State Exam 08/20/2015  Orientation to time 0  Orientation to Place 4  Registration 2  Attention/ Calculation 3  Recall 0  Language- name 2 objects 2  Language- repeat 1  Language- follow 3 step command 2  Language- read & follow direction 0  Write a sentence 0  Copy design 0  Total score 14     Physical Exam  Constitutional: He appears well-developed and well-nourished. No distress.  Cardiovascular: Normal rate, regular rhythm, normal heart sounds and intact distal pulses.   Pulmonary/Chest: Effort normal and breath sounds normal.  Neurological: He is alert. He is disoriented.  Skin: Skin is warm and dry.  Psychiatric: He has a normal mood and affect. His behavior is normal. Thought content normal. Cognition and memory are impaired. He expresses inappropriate judgment. He exhibits abnormal recent memory and abnormal remote memory.  Tearful at times.        Assessment & Plan:   Problem List Items Addressed This Visit      Other   Chin laceration - Primary    Chin laceration with dissolvable sutures appears with good approximation and healing well with no evidence of infection. Continue basic wound care for the next week and may begin shaving with the next week pending healing. No further intervention is needed currently.       Impaired memory    MMSE score of 14/30 consistent with moderate degree of impairment with clear deficits. Recommend further evaluation and assessment with neurology. Concern for worsening impairment with possible need for increased supervision and assistance with co-morbid condition management while increased concern for overall safety. Consider PACE program while family is at work. Refer to Geriatrics per patient request. There is also possilbity of underlying depression given tearful mood. Follow up if symptoms worsen prior to referrals.       Relevant Orders   Ambulatory referral to Neurology   Ambulatory referral to Geriatrics

## 2015-08-20 NOTE — Patient Instructions (Signed)
Thank you for choosing [facility].  Summary/Instructions:  If your symptoms worsen or fail to improve, please contact our office for further instruction, or in case of emergency go directly to the emergency room at the closest medical facility.   Dementia Dementia is a general term for problems with brain function. A person with dementia has memory loss and a hard time with at least one other brain function such as thinking, speaking, or problem solving. Dementia can affect social functioning, how you do your job, your mood, or your personality. The changes may be hidden for a long time. The earliest forms of this disease are usually not detected by family or friends. Dementia can be:  Irreversible.  Potentially reversible.  Partially reversible.  Progressive. This means it can get worse over time. CAUSES  Irreversible dementia causes may include:  Degeneration of brain cells (Alzheimer disease or Lewy body dementia).  Multiple small strokes (vascular dementia).  Infection (chronic meningitis or Creutzfeldt-Jakob disease).  Frontotemporal dementia. This affects younger people, age 69 to 38, compared to those who have Alzheimer disease.  Dementia associated with other disorders like Parkinson disease, Huntington disease, or HIV-associated dementia. Potentially or partially reversible dementia causes may include:  Medicines.  Metabolic causes such as excessive alcohol intake, vitamin B12 deficiency, or thyroid disease.  Masses or pressure in the brain such as a tumor, blood clot, or hydrocephalus. SIGNS AND SYMPTOMS  Symptoms are often hard to detect. Family members or coworkers may not notice them early in the disease process. Different people with dementia may have different symptoms. Symptoms can include:  A hard time with memory, especially recent memory. Long-term memory may not be impaired.  Asking the same question multiple times or forgetting something someone  just said.  A hard time speaking your thoughts or finding certain words.  A hard time solving problems or performing familiar tasks (such as how to use a telephone).  Sudden changes in mood.  Changes in personality, especially increasing moodiness or mistrust.  Depression.  A hard time understanding complex ideas that were never a problem in the past. DIAGNOSIS  There are no specific tests for dementia.   Your health care provider may recommend a thorough evaluation. This is because some forms of dementia can be reversible. The evaluation will likely include a physical exam and getting a detailed history from you and a family member. The history often gives the best clues and suggestions for a diagnosis.  Memory testing may be done. A detailed brain function evaluation called neuropsychologic testing may be helpful.  Lab tests and brain imaging (such as a CT scan or MRI scan) are sometimes important.  Sometimes observation and re-evaluation over time is very helpful. TREATMENT  Treatment depends on the cause.   If the problem is a vitamin deficiency, it may be helped or cured with supplements.  For dementias such as Alzheimer disease, medicines are available to stabilize or slow the course of the disease. There are no cures for this type of dementia.  Your health care provider can help direct you to groups, organizations, and other health care providers to help with decisions in the care of you or your loved one. HOME CARE INSTRUCTIONS The care of individuals with dementia is varied and dependent upon the progression of the dementia. The following suggestions are intended for the person living with, or caring for, the person with dementia.  Create a safe environment.  Remove the locks on bathroom doors to prevent the person  from accidentally locking himself or herself in.  Use childproof latches on kitchen cabinets and any place where cleaning supplies, chemicals, or alcohol are  kept.  Use childproof covers in unused electrical outlets.  Install childproof devices to keep doors and windows secured.  Remove stove knobs or install safety knobs and an automatic shut-off on the stove.  Lower the temperature on water heaters.  Label medicines and keep them locked up.  Secure knives, lighters, matches, power tools, and guns, and keep these items out of reach.  Keep the house free from clutter. Remove rugs or anything that might contribute to a fall.  Remove objects that might break and hurt the person.  Make sure lighting is good, both inside and outside.  Install grab rails as needed.  Use a monitoring device to alert you to falls or other needs for help.  Reduce confusion.  Keep familiar objects and people around.  Use night lights or dim lights at night.  Label items or areas.  Use reminders, notes, or directions for daily activities or tasks.  Keep a simple, consistent routine for waking, meals, bathing, dressing, and bedtime.  Create a calm, quiet environment.  Place large clocks and calendars prominently.  Display emergency numbers and home address near all telephones.  Use cues to establish different times of the day. An example is to open curtains to let the natural light in during the day.   Use effective communication.  Choose simple words and short sentences.  Use a gentle, calm tone of voice.  Be careful not to interrupt.  If the person is struggling to find a word or communicate a thought, try to provide the word or thought.  Ask one question at a time. Allow the person ample time to answer questions. Repeat the question again if the person does not respond.  Reduce nighttime restlessness.  Provide a comfortable bed.  Have a consistent nighttime routine.  Ensure a regular walking or physical activity schedule. Involve the person in daily activities as much as possible.  Limit napping during the day.  Limit  caffeine.  Attend social events that stimulate rather than overwhelm the senses.  Encourage good nutrition and hydration.  Reduce distractions during meal times and snacks.  Avoid foods that are too hot or too cold.  Monitor chewing and swallowing ability.  Continue with routine vision, hearing, dental, and medical screenings.  Give medicines only as directed by the health care provider.  Monitor driving abilities. Do not allow the person to drive when safe driving is no longer possible.  Register with an identification program which could provide location assistance in the event of a missing person situation. SEEK MEDICAL CARE IF:   New behavioral problems start such as moodiness, aggressiveness, or seeing things that are not there (hallucinations).  Any new problem with brain function happens. This includes problems with balance, speech, or falling a lot.  Problems with swallowing develop.  Any symptoms of other illness happen. Small changes or worsening in any aspect of brain function can be a sign that the illness is getting worse. It can also be a sign of another medical illness such as infection. Seeing a health care provider right away is important. SEEK IMMEDIATE MEDICAL CARE IF:   A fever develops.  New or worsened confusion develops.  New or worsened sleepiness develops.  Staying awake becomes hard to do.   This information is not intended to replace advice given to you by your health care provider.  Make sure you discuss any questions you have with your health care provider.   Document Released: 01/12/2001 Document Revised: 08/09/2014 Document Reviewed: 12/14/2010 Elsevier Interactive Patient Education [employer].

## 2015-08-27 ENCOUNTER — Other Ambulatory Visit: Payer: Self-pay | Admitting: Internal Medicine

## 2015-09-05 ENCOUNTER — Ambulatory Visit: Payer: Commercial Managed Care - HMO | Admitting: Cardiology

## 2015-09-10 ENCOUNTER — Encounter: Payer: Self-pay | Admitting: Cardiology

## 2015-09-10 ENCOUNTER — Ambulatory Visit (INDEPENDENT_AMBULATORY_CARE_PROVIDER_SITE_OTHER): Payer: Commercial Managed Care - HMO | Admitting: Cardiology

## 2015-09-10 VITALS — BP 160/64 | HR 80 | Ht 71.0 in | Wt 174.0 lb

## 2015-09-10 DIAGNOSIS — E785 Hyperlipidemia, unspecified: Secondary | ICD-10-CM | POA: Diagnosis not present

## 2015-09-10 DIAGNOSIS — I2583 Coronary atherosclerosis due to lipid rich plaque: Secondary | ICD-10-CM

## 2015-09-10 DIAGNOSIS — I2510 Atherosclerotic heart disease of native coronary artery without angina pectoris: Secondary | ICD-10-CM | POA: Diagnosis not present

## 2015-09-10 DIAGNOSIS — I119 Hypertensive heart disease without heart failure: Secondary | ICD-10-CM | POA: Diagnosis not present

## 2015-09-10 NOTE — Progress Notes (Signed)
Patient ID: [name], male   DOB: 06/30/1922, 82 y.o.   MRN: [id number]      Cardiology Office Note  Date:  09/10/2015   ID:  [name], DOB 15-Aug-1921, MRN [id number]  PCP:  Hoyt Koch, MD  Cardiologist:  Dorothy Spark, MD   Chief complain: Follow-up for CAD.  History of Present Illness: [name] is a 82 y.o. male who presents for evaluation of coronary artery disease. The patient is a very pleasant younger appearing 82 year old gentleman who is still fully active walking every day. He has history of stenting of his LAD in 2004. He hasn't followed with cardiology since then. He is accompanied by his daughter who states that he is doing well however in December he fell and his syncope was not explained. Patient's denied any chest pain shortness of breath or palpitation prior to the episode he has no recollection of the episode. He is compliant with his medicines denies any chest pain, dyspnea on exertion, claudications or palpitations. He's busy being followed by his primary care physician who also follows him for diabetes and hyperlipidemia.   Past Medical History  Diagnosis Date  . Arthritis   . Type 2 diabetes mellitus ([place])   . Depression   . Dyslipidemia   . Hypertension   . CAD (coronary artery disease)     s/p DES to LAD 06/2013  . Glaucoma   . Hyperglycemia 04/17/2014   Past Surgical History  Procedure Laterality Date  . Cardiac catheterization  06/2013    DES to LAD by Dr Tamala Julian  . Right cataract     Current Outpatient Prescriptions  Medication Sig Dispense Refill  . aspirin EC 81 MG tablet Take 1 tablet (81 mg total) by mouth daily. 150 tablet 2  . Blood Glucose Monitoring Suppl (PRODIGY AUTOCODE BLOOD GLUCOSE) W/DEVICE KIT Use as directed 1 each 0  . Glucose Blood (PRODIGY BLOOD GLUCOSE TEST VI) by In Vitro route. Use to check blood sugars twice a day Dx 250.00    . glucose blood test strip 1 each by Other route 2 (two) times daily. Use to  check blood sugar twice a day Dx 250.00 180 each 3  . hydrochlorothiazide (HYDRODIURIL) 25 MG tablet TAKE 1 TABLET EVERY DAY 90 tablet 3  . Loratadine 10 MG CAPS Take 1 capsule by mouth daily.    . metFORMIN (GLUCOPHAGE) 500 MG tablet Take 1 tablet (500 mg total) by mouth 2 (two) times daily with a meal. 180 tablet 3  . Multiple Vitamin (MULTIVITAMIN WITH MINERALS) TABS tablet Take 1 tablet by mouth daily.    [name] PRODIGY TWIST TOP LANCETS 28G MISC 1 each by Does not apply route daily. 1 each 2  . simvastatin (ZOCOR) 20 MG tablet TAKE 1 TABLET EVERY DAY 90 tablet 2   No current facility-administered medications for this visit.   Allergies:   Review of patient's allergies indicates no known allergies.   Social History:  The patient  reports that he quit smoking about 65 years ago. His smoking use included Cigarettes. He has never used smokeless tobacco. He reports that he does not drink alcohol or use illicit drugs.   Family History:  The patient's family history includes Arthritis in his father, mother, and other; Breast cancer in his mother; Colon cancer in his other; Diabetes in his other; Stroke in his other.    ROS:  Please see the history of present illness.   Otherwise, review of systems are positive for  none.   All other systems are reviewed and negative.   PHYSICAL EXAM: VS:  BP 160/64 mmHg  Ht '5\' 11"'  (1.803 m)  Wt 174 lb (78.926 kg)  BMI 24.28 kg/m2 , BMI Body mass index is 24.28 kg/(m^2). GEN: Well nourished, well developed, in no acute distress HEENT: normal Neck: no JVD, carotid bruits, or masses Cardiac: RRR; no murmurs, rubs, or gallops,no edema  Respiratory:  clear to auscultation bilaterally, normal work of breathing GI: soft, nontender, nondistended, + BS MS: no deformity or atrophy Skin: warm and dry, no rash Neuro:  Strength and sensation are intact Psych: euthymic mood, full affect  EKG:  EKG is ordered today. The ekg ordered today demonstrates SR, LVH,  anterolateral ischemia  Recent Labs: 07/29/2015: BUN 17; Creatinine, Ser 1.38; Potassium 4.4; Sodium 141   Lipid Panel    Component Value Date/Time   CHOL 144 07/29/2014 1002   TRIG 121.0 07/29/2014 1002   HDL 36.60* 07/29/2014 1002   CHOLHDL 4 07/29/2014 1002   VLDL 24.2 07/29/2014 1002   LDLCALC 83 07/29/2014 1002   Wt Readings from Last 3 Encounters:  09/10/15 174 lb (78.926 kg)  08/20/15 173 lb (78.472 kg)  07/10/15 173 lb (78.472 kg)       ASSESSMENT AND PLAN:  1. CAD, PCI to LAD in 2004, patient has significantly abnormal EKG with LVH and negative T waves in inferolateral leads that were present in 2015 however we don't have older EKG to compare to. The patient states he is completely asymptomatic. Considering his age, CK D stage III and lack of symptoms we will not proceed with ischemia workup right now. We will obtain echocardiogram and if new wall motion abnormalities we will consider stress testing.  2. Hypertension - appropriate for age, continue the same management.  3. Hyperlipidemia - on 20 mg of simvastatin followed by primary care physician continue the same management.  Follow up in 6 months.  Signed, Dorothy Spark, MD  09/10/2015 8:39 AM    [address], [place], [place]  [address] Phone: [phone]; Fax: [phone]

## 2015-09-10 NOTE — Patient Instructions (Signed)
Medication Instructions:   Your physician recommends that you continue on your current medications as directed. Please refer to the Current Medication list given to you today.    Testing/Procedures:  Your physician has requested that you have an echocardiogram. Echocardiography is a painless test that uses sound waves to create images of your heart. It provides your doctor with information about the size and shape of your heart and how well your heart's chambers and valves are working. This procedure takes approximately one hour. There are no restrictions for this procedure.    Follow-Up:  Your physician wants you to follow-up in: 6 MONTHS WITH DR NELSON You will receive a reminder letter in the mail two months in advance. If you don't receive a letter, please call our office to schedule the follow-up appointment.      If you need a refill on your cardiac medications before your next appointment, please call your pharmacy.   

## 2015-09-11 ENCOUNTER — Other Ambulatory Visit: Payer: Self-pay

## 2015-09-11 ENCOUNTER — Ambulatory Visit (HOSPITAL_COMMUNITY): Payer: Commercial Managed Care - HMO | Attending: Cardiology

## 2015-09-11 DIAGNOSIS — I340 Nonrheumatic mitral (valve) insufficiency: Secondary | ICD-10-CM | POA: Diagnosis not present

## 2015-09-11 DIAGNOSIS — I351 Nonrheumatic aortic (valve) insufficiency: Secondary | ICD-10-CM | POA: Diagnosis not present

## 2015-09-11 DIAGNOSIS — I119 Hypertensive heart disease without heart failure: Secondary | ICD-10-CM | POA: Diagnosis not present

## 2015-09-11 DIAGNOSIS — E119 Type 2 diabetes mellitus without complications: Secondary | ICD-10-CM | POA: Diagnosis not present

## 2015-09-11 DIAGNOSIS — E785 Hyperlipidemia, unspecified: Secondary | ICD-10-CM

## 2015-09-11 DIAGNOSIS — I517 Cardiomegaly: Secondary | ICD-10-CM | POA: Insufficient documentation

## 2015-09-11 DIAGNOSIS — I071 Rheumatic tricuspid insufficiency: Secondary | ICD-10-CM | POA: Insufficient documentation

## 2015-09-18 ENCOUNTER — Other Ambulatory Visit: Payer: Self-pay | Admitting: Internal Medicine

## 2015-09-19 ENCOUNTER — Encounter: Payer: Self-pay | Admitting: Neurology

## 2015-09-19 ENCOUNTER — Ambulatory Visit (INDEPENDENT_AMBULATORY_CARE_PROVIDER_SITE_OTHER): Payer: Commercial Managed Care - HMO | Admitting: Neurology

## 2015-09-19 VITALS — BP 142/82 | HR 79 | Wt 171.0 lb

## 2015-09-19 DIAGNOSIS — F03B18 Unspecified dementia, moderate, with other behavioral disturbance: Secondary | ICD-10-CM | POA: Insufficient documentation

## 2015-09-19 DIAGNOSIS — F32A Depression, unspecified: Secondary | ICD-10-CM | POA: Insufficient documentation

## 2015-09-19 DIAGNOSIS — F0391 Unspecified dementia with behavioral disturbance: Secondary | ICD-10-CM

## 2015-09-19 DIAGNOSIS — F329 Major depressive disorder, single episode, unspecified: Secondary | ICD-10-CM

## 2015-09-19 MED ORDER — ESCITALOPRAM OXALATE 5 MG PO TABS: ORAL_TABLET | ORAL | Status: DC

## 2015-09-19 NOTE — Patient Instructions (Signed)
1. Consider starting a medication for depression: Lexapro 5mg  daily 2. Start looking into [facility] day programs so he can have 24/7 care 3. No driving 4. Follow-up in 1 year  For more information about dementia: Recommended Books:  1) The 36-Hour Day: A Family Guide to Caring for People Who Have Alzheimer Disease, Related Dementias, and Memory Loss (5th edition) by Oletta Lamas. Mace and Viviano Simas Rabins  2) Understanding Difficult Behaviors: Some practical suggestions for coping with Alzheimer's disease and related illnesses by Loraine Grip and Jake Seats  3) A Caregiver's Guide to Dementia: Using Activities and Other Strategies to Prevent, Reduce, and Manage Behavioral Symptoms by Osie Bond Tyler Aas and [employer]
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
## 2015-10-17 ENCOUNTER — Telehealth: Payer: Self-pay | Admitting: Internal Medicine

## 2015-10-17 NOTE — Telephone Encounter (Signed)
Ok with me 

## 2015-10-17 NOTE — Telephone Encounter (Signed)
Pt request to transfer from Dr. Sharlet Salina to [place]. Please advise.   # [phone]

## 2015-10-17 NOTE — Telephone Encounter (Signed)
Fine with me

## 2015-10-20 NOTE — Telephone Encounter (Signed)
appt made

## 2015-11-04 ENCOUNTER — Encounter: Payer: Commercial Managed Care - HMO | Admitting: Family

## 2015-11-04 DIAGNOSIS — Z0289 Encounter for other administrative examinations: Secondary | ICD-10-CM

## 2015-11-19 ENCOUNTER — Encounter: Payer: Self-pay | Admitting: Family

## 2015-11-19 ENCOUNTER — Ambulatory Visit (INDEPENDENT_AMBULATORY_CARE_PROVIDER_SITE_OTHER): Payer: Commercial Managed Care - HMO | Admitting: Family

## 2015-11-19 VITALS — BP 160/70 | HR 75 | Temp 97.7°F | Resp 16 | Ht 71.0 in | Wt 177.0 lb

## 2015-11-19 DIAGNOSIS — E1151 Type 2 diabetes mellitus with diabetic peripheral angiopathy without gangrene: Secondary | ICD-10-CM | POA: Diagnosis not present

## 2015-11-19 DIAGNOSIS — Z0000 Encounter for general adult medical examination without abnormal findings: Secondary | ICD-10-CM | POA: Diagnosis not present

## 2015-11-19 DIAGNOSIS — Z515 Encounter for palliative care: Secondary | ICD-10-CM | POA: Insufficient documentation

## 2015-11-19 DIAGNOSIS — I10 Essential (primary) hypertension: Secondary | ICD-10-CM | POA: Diagnosis not present

## 2015-11-19 MED ORDER — AMLODIPINE BESYLATE 5 MG PO TABS: 5.0000 mg | ORAL_TABLET | Freq: Every day | ORAL | Status: DC

## 2015-11-19 NOTE — Patient Instructions (Signed)
Thank you for choosing [facility].  Summary/Instructions:  Your prescription(s) have been submitted to your pharmacy or been printed and provided for you. Please take as directed and contact our office if you believe you are having problem(s) with the medication(s) or have any questions.  Please stop by the lab on the basement level of the building for your blood work. Your results will be released to [place] (or called to you) after review, usually within 72 hours after test completion. If any changes need to be made, you will be notified at that same time.  If your symptoms worsen or fail to improve, please contact our office for further instruction, or in case of emergency go directly to the emergency room at the closest medical facility.   Health Maintenance  Topic Date Due  . ZOSTAVAX  04/03/1984  . URINE MICROALBUMIN  07/30/2015  . OPHTHALMOLOGY EXAM  08/17/2015  . HEMOGLOBIN A1C  01/08/2016  . INFLUENZA VACCINE  03/02/2016  . FOOT EXAM  07/09/2016  . TETANUS/TDAP  07/29/2025  . PNA vac Low Risk Adult  Completed   Health Maintenance, Male A healthy lifestyle and preventative care can promote health and wellness.  Maintain regular health, dental, and eye exams.  Eat a healthy diet. Foods like vegetables, fruits, whole grains, low-fat dairy products, and lean protein foods contain the nutrients you need and are low in calories. Decrease your intake of foods high in solid fats, added sugars, and salt. Get information about a proper diet from your health care provider, if necessary.  Regular physical exercise is one of the most important things you can do for your health. Most adults should get at least 150 minutes of moderate-intensity exercise (any activity that increases your heart rate and causes you to sweat) each week. In addition, most adults need muscle-strengthening exercises on 2 or more days a week.   Maintain a healthy weight. The body mass index (BMI) is a  screening tool to identify possible weight problems. It provides an estimate of body fat based on height and weight. Your health care provider can find your BMI and can help you achieve or maintain a healthy weight. For males 20 years and older:  A BMI below 18.5 is considered underweight.  A BMI of 18.5 to 24.9 is normal.  A BMI of 25 to 29.9 is considered overweight.  A BMI of 30 and above is considered obese.  Maintain normal blood lipids and cholesterol by exercising and minimizing your intake of saturated fat. Eat a balanced diet with plenty of fruits and vegetables. Blood tests for lipids and cholesterol should begin at age 19 and be repeated every 5 years. If your lipid or cholesterol levels are high, you are over age 48, or you are at high risk for heart disease, you may need your cholesterol levels checked more frequently.Ongoing high lipid and cholesterol levels should be treated with medicines if diet and exercise are not working.  If you smoke, find out from your health care provider how to quit. If you do not use tobacco, do not start.  Lung cancer screening is recommended for adults aged 68-80 years who are at high risk for developing lung cancer because of a history of smoking. A yearly low-dose CT scan of the lungs is recommended for people who have at least a 30-pack-year history of smoking and are current smokers or have quit within the past 15 years. A pack year of smoking is smoking an average of 1 pack  of cigarettes a day for 1 year (for example, a 30-pack-year history of smoking could mean smoking 1 pack a day for 30 years or 2 packs a day for 15 years). Yearly screening should continue until the smoker has stopped smoking for at least 15 years. Yearly screening should be stopped for people who develop a health problem that would prevent them from having lung cancer treatment.  If you choose to drink alcohol, do not have more than 2 drinks per day. One drink is considered to be  12 oz (360 mL) of beer, 5 oz (150 mL) of wine, or 1.5 oz (45 mL) of liquor.  Avoid the use of street drugs. Do not share needles with anyone. Ask for help if you need support or instructions about stopping the use of drugs.  High blood pressure causes heart disease and increases the risk of stroke. High blood pressure is more likely to develop in:  People who have blood pressure in the end of the normal range (100-139/85-89 mm Hg).  People who are overweight or obese.  People who are African American.  If you are 86-14 years of age, have your blood pressure checked every 3-5 years. If you are 75 years of age or older, have your blood pressure checked every year. You should have your blood pressure measured twice--once when you are at a hospital or clinic, and once when you are not at a hospital or clinic. Record the average of the two measurements. To check your blood pressure when you are not at a hospital or clinic, you can use:  An automated blood pressure machine at a pharmacy.  A home blood pressure monitor.  If you are 18-15 years old, ask your health care provider if you should take aspirin to prevent heart disease.  Diabetes screening involves taking a blood sample to check your fasting blood sugar level. This should be done once every 3 years after age 60 if you are at a normal weight and without risk factors for diabetes. Testing should be considered at a younger age or be carried out more frequently if you are overweight and have at least 1 risk factor for diabetes.  Colorectal cancer can be detected and often prevented. Most routine colorectal cancer screening begins at the age of 65 and continues through age 54. However, your health care provider may recommend screening at an earlier age if you have risk factors for colon cancer. On a yearly basis, your health care provider may provide home test kits to check for hidden blood in the stool. A small camera at the end of a tube may be  used to directly examine the colon (sigmoidoscopy or colonoscopy) to detect the earliest forms of colorectal cancer. Talk to your health care provider about this at age 80 when routine screening begins. A direct exam of the colon should be repeated every 5-10 years through age 14, unless early forms of precancerous polyps or small growths are found.  People who are at an increased risk for hepatitis B should be screened for this virus. You are considered at high risk for hepatitis B if:  You were born in a country where hepatitis B occurs often. Talk with your health care provider about which countries are considered high risk.  Your parents were born in a high-risk country and you have not received a shot to protect against hepatitis B (hepatitis B vaccine).  You have HIV or AIDS.  You use needles to inject street drugs.  You live with, or have sex with, someone who has hepatitis B.  You are a man who has sex with other men (MSM).  You get hemodialysis treatment.  You take certain medicines for conditions like cancer, organ transplantation, and autoimmune conditions.  Hepatitis C blood testing is recommended for all people born from [birth year] through 1963 and any individual with known risk factors for hepatitis C.  Healthy men should no longer receive prostate-specific antigen (PSA) blood tests as part of routine cancer screening. Talk to your health care provider about prostate cancer screening.  Testicular cancer screening is not recommended for adolescents or adult males who have no symptoms. Screening includes self-exam, a health care provider exam, and other screening tests. Consult with your health care provider about any symptoms you have or any concerns you have about testicular cancer.  Practice safe sex. Use condoms and avoid high-risk sexual practices to reduce the spread of sexually transmitted infections (STIs).  You should be screened for STIs, including gonorrhea and chlamydia  if:  You are sexually active and are younger than 24 years.  You are older than 24 years, and your health care provider tells you that you are at risk for this type of infection.  Your sexual activity has changed since you were last screened, and you are at an increased risk for chlamydia or gonorrhea. Ask your health care provider if you are at risk.  If you are at risk of being infected with HIV, it is recommended that you take a prescription medicine daily to prevent HIV infection. This is called pre-exposure prophylaxis (PrEP). You are considered at risk if:  You are a man who has sex with other men (MSM).  You are a heterosexual man who is sexually active with multiple partners.  You take drugs by injection.  You are sexually active with a partner who has HIV.  Talk with your health care provider about whether you are at high risk of being infected with HIV. If you choose to begin PrEP, you should first be tested for HIV. You should then be tested every 3 months for as long as you are taking PrEP.  Use sunscreen. Apply sunscreen liberally and repeatedly throughout the day. You should seek shade when your shadow is shorter than you. Protect yourself by wearing long sleeves, pants, a wide-brimmed hat, and sunglasses year round whenever you are outdoors.  Tell your health care provider of new moles or changes in moles, especially if there is a change in shape or color. Also, tell your health care provider if a mole is larger than the size of a pencil eraser.  A one-time screening for abdominal aortic aneurysm (AAA) and surgical repair of large AAAs by ultrasound is recommended for men aged 57-75 years who are current or former smokers.  Stay current with your vaccines (immunizations).   This information is not intended to replace advice given to you by your health care provider. Make sure you discuss any questions you have with your health care provider.   Document Released:  01/15/2008 Document Revised: 08/09/2014 Document Reviewed: 12/14/2010 Elsevier Interactive Patient Education [employer].

## 2015-11-19 NOTE — Assessment & Plan Note (Signed)
1) Anticipatory Guidance: Discussed importance of wearing a seatbelt while driving and not texting while driving; changing batteries in smoke detector at least once annually; wearing suntan lotion when outside; eating a balanced and moderate diet; getting physical activity at least 30 minutes per day.  2) Immunizations / Screenings / Labs:  Declines Zostavax. All other immunizations are up-to-date per recommendations. Obtain PSA for prostate cancer screening. Due for a dental screening encouraged to be completed independently. Routine EKG is abnormal with sinus rhythm with ectopic beats and slightly irregular. Per cardiology, no ischemic work up at this time. All other screenings are up-to-date per recommendations or has aged out. Obtain CBC, CMET, and Lipid profile.    Overall well exam with risk factors for cardiovascular disease including dyslipidemia, hypertension, coronary artery disease, and diabetes. Blood pressure remains elevated above goal 140/90 with current regimen. Continue current dosage of hydrochlorothiazide. Start amlodipine. Dyslipidemia and diabetes status will be determined through blood work today. Continue current dosage of medications as prescribed. MMSE concerning for mild dementia and working with neurology. His mood appears improved today although tearful at times. Does have some difficulty performing activities of daily living independently secondary to cognitive dysfunction which may require further assistance in the near future. We'll continue to monitor at this time. Continue other healthy lifestyle behaviors and choices. Follow-up prevention exam in 1 year. Follow-up office visit for chronic conditions.

## 2015-11-19 NOTE — Assessment & Plan Note (Signed)
Reviewed and updated patient's medical, surgical, family and social history. Medications and allergies were also reviewed. Basic screenings for depression, activities of daily living, hearing, cognition and safety were performed. Provider list was updated and health plan was provided to the patient.  

## 2015-11-19 NOTE — Progress Notes (Signed)
Pre visit review using our clinic review tool, if applicable. No additional management support is needed unless otherwise documented below in the visit note. 

## 2015-11-19 NOTE — Progress Notes (Signed)
.   Subjective:    Patient ID: [name], male    DOB: 1922-05-12, 82 y.o.   MRN: [id number]  Chief Complaint  Patient presents with  . CPE    Not fasting    HPI:  [name] is a 82 y.o. male who presents today for an annual wellness visit. Daughter is present and provides a significant amount of the history.    1) Health Maintenance -   Diet - Averages about 2-3 meals per day consisting of fruits, vegetables, chicken, beef, pork, and fish; 1-2 cups of caffeine daily.   Exercise - Generally housework and occasional yard work  2) Publishing rights manager / Immunizations:  Dental -- Due for exam  Vision -- Up to date   Health Maintenance  Topic Date Due  . ZOSTAVAX  04/03/1984  . URINE MICROALBUMIN  07/30/2015  . OPHTHALMOLOGY EXAM  08/17/2015  . HEMOGLOBIN A1C  01/08/2016  . INFLUENZA VACCINE  03/02/2016  . FOOT EXAM  07/09/2016  . TETANUS/TDAP  07/29/2025  . PNA vac Low Risk Adult  Completed     Immunization History  Administered Date(s) Administered  . Influenza, High Dose Seasonal PF 06/22/2013, 05/30/2015  . Influenza,inj,Quad PF,36+ Mos 04/19/2014  . Pneumococcal Conjugate-13 07/25/2014  . Pneumococcal Polysaccharide-23 08/03/2011, 06/22/2013  . Td 10/22/2013  . Tdap 07/30/2015    RISK FACTORS  Tobacco History  Smoking status  . Former Smoker  . Types: Cigarettes  . Quit date: 08/02/1950  Smokeless tobacco  . Never Used     Cardiac risk factors: advanced age (older than 12 for men, 30 for women), diabetes mellitus, dyslipidemia, hypertension and male gender.  Depression Screen  Q1: Over the past two weeks, have you felt down, depressed or hopeless? No  Q2: Over the past two weeks, have you felt little interest or pleasure in doing things? No  Have you lost interest or pleasure in daily life? No  Do you often feel hopeless? No  Do you cry easily over simple problems? No  Activities of Daily Living In your present state of health, do you  have any difficulty performing the following activities?:  Driving? Not currently driving Managing money?  Family managing money Feeding yourself? No Getting from bed to chair? No Climbing a flight of stairs? No Preparing food and eating?: Prepared by family  Bathing or showering? No Getting dressed: No Getting to the toilet? No Using the toilet: No Moving around from place to place: No In the past year have you fallen or had a near fall?:No   Home Safety Has smoke detector and wears seat belts. No firearms. No excess sun exposure. Are there smokers in your home (other than you)?  No Do you feel safe at home?  Yes  Hearing Difficulties: No Do you often ask people to speak up or repeat themselves? No Do you experience ringing or noises in your ears? No  Do you have difficulty understanding soft or whispered voices? No    Cognitive Testing  MMSE 15/30   Advanced Directives have been discussed with the patient? Yes  Current Physicians/Providers and Suppliers  1. Terri Piedra, FNP - Internal Medicine 2. Ena Dawley, MD - Cardiology 3. [name] Newer, MD  - Neurology   Indicate any recent Medical Services you may have received from other than [facility] providers in the past year (date may be approximate).  All answers were reviewed with the patient and necessary referrals were made:  [name], [place]   11/19/2015  No Known Allergies   Outpatient Prescriptions Prior to Visit  Medication Sig Dispense Refill  . aspirin EC 81 MG tablet Take 1 tablet (81 mg total) by mouth daily. 150 tablet 2  . escitalopram (LEXAPRO) 5 MG tablet Take 1 tablet daily 30 tablet 11  . hydrochlorothiazide (HYDRODIURIL) 25 MG tablet TAKE 1 TABLET EVERY DAY 90 tablet 3  . Loratadine 10 MG CAPS Take 1 capsule by mouth daily.    . metFORMIN (GLUCOPHAGE) 500 MG tablet Take 1 tablet (500 mg total) by mouth 2 (two) times daily with a meal. 180 tablet 3  . Multiple Vitamin (MULTIVITAMIN WITH MINERALS)  TABS tablet Take 1 tablet by mouth daily.    . simvastatin (ZOCOR) 20 MG tablet TAKE 1 TABLET EVERY DAY 90 tablet 2   No facility-administered medications prior to visit.     Past Medical History  Diagnosis Date  . Arthritis   . Type 2 diabetes mellitus ([place])   . Depression   . Dyslipidemia   . Hypertension   . CAD (coronary artery disease)     s/p DES to LAD 06/2013  . Glaucoma   . Hyperglycemia 04/17/2014     Past Surgical History  Procedure Laterality Date  . Cardiac catheterization  06/2013    DES to LAD by Dr Tamala Julian  . Right cataract       Family History  Problem Relation Age of Onset  . Arthritis Mother   . Breast cancer Mother   . Arthritis Father   . Diabetes Other   . Arthritis Other   . Colon cancer Other   . Stroke Other      Social History   Social History  . Marital Status: Widowed    Spouse Name: N/A  . Number of Children: 4  . Years of Education: N/A   Occupational History  . Retired    Social History Main Topics  . Smoking status: Former Smoker    Types: Cigarettes    Quit date: 08/02/1950  . Smokeless tobacco: Never Used  . Alcohol Use: No  . Drug Use: No  . Sexual Activity: Not on file   Other Topics Concern  . Not on file   Social History Narrative   Retired Warden/ranger   widowed, lives with dtr and youngest son   5 kids from first marriage     Review of Systems  Constitutional: Denies fever, chills, fatigue, or significant weight gain/loss. HENT: Head: Denies headache or neck pain Ears: Denies changes in hearing, ringing in ears, earache, drainage Nose: Denies discharge, stuffiness, itching, nosebleed, sinus pain Throat: Denies sore throat, hoarseness, dry mouth, sores, thrush Eyes: Denies loss/changes in vision, pain, redness, blurry/double vision, flashing lights Cardiovascular: Denies chest pain/discomfort, tightness, palpitations, shortness of breath with activity, difficulty lying down, swelling, sudden awakening  with shortness of breath Respiratory: Denies shortness of breath, cough, sputum production, wheezing Gastrointestinal: Denies dysphasia, heartburn, change in appetite, nausea, change in bowel habits, rectal bleeding, constipation, diarrhea, yellow skin or eyes Genitourinary: Denies frequency, urgency, burning/pain, blood in urine, incontinence, change in urinary strength. Musculoskeletal: Denies muscle/joint pain, stiffness, back pain, redness or swelling of joints, trauma Skin: Denies rashes, lumps, itching, dryness, color changes, or hair/nail changes Neurological: Denies dizziness, fainting, seizures, weakness, numbness, tingling, tremor Psychiatric - Denies nervousness, stress, depression or memory loss Endocrine: Denies heat or cold intolerance, sweating, frequent urination, excessive thirst, changes in appetite Hematologic: Denies ease of bruising or bleeding    Objective:  BP 160/70 mmHg  Pulse 75  Temp(Src) 97.7 F (36.5 C) (Oral)  Resp 16  Ht 5\' 11"  (1.803 m)  Wt 177 lb (80.287 kg)  BMI 24.70 kg/m2  SpO2 96% Nursing note and vital signs reviewed.  Physical Exam  Constitutional: He is oriented to person, place, and time. He appears well-developed and well-nourished.  HENT:  Head: Normocephalic.  Right Ear: Hearing, tympanic membrane, external ear and ear canal normal.  Left Ear: Hearing, tympanic membrane, external ear and ear canal normal.  Nose: Nose normal.  Mouth/Throat: Uvula is midline, oropharynx is clear and moist and mucous membranes are normal.  Eyes: Conjunctivae and EOM are normal. Pupils are equal, round, and reactive to light.  Neck: Neck supple. No JVD present. No tracheal deviation present. No thyromegaly present.  Cardiovascular: Normal rate, regular rhythm, normal heart sounds and intact distal pulses.   Pulmonary/Chest: Effort normal and breath sounds normal.  Abdominal: Soft. Bowel sounds are normal. He exhibits no distension and no mass. There is  no tenderness. There is no rebound and no guarding.  Musculoskeletal: Normal range of motion. He exhibits no edema or tenderness.  Lymphadenopathy:    He has no cervical adenopathy.  Neurological: He is alert and oriented to person, place, and time. He has normal reflexes. No cranial nerve deficit. He exhibits normal muscle tone. Coordination normal.  Skin: Skin is warm and dry.  Psychiatric: He has a normal mood and affect. His behavior is normal. Judgment and thought content normal.       Assessment & Plan:   During the course of the visit the patient was educated and counseled about appropriate screening and preventive services including:    Influenza vaccine  Td vaccine  Diet review for nutrition referral? Yes ____  Not Indicated _X___   Patient Instructions (the written plan) was given to the patient.  Medicare Attestation I have personally reviewed: The patient's medical and social history Their use of alcohol, tobacco or illicit drugs Their current medications and supplements The patient's functional ability including ADLs,fall risks, home safety risks, cognitive, and hearing and visual impairment Diet and physical activities Evidence for depression or mood disorders  The patient's weight, height, BMI,  have been recorded in the chart.  I have made referrals, counseling, and provided education to the patient based on review of the above and I have provided the patient with a written personalized care plan for preventive services.     [name], [place]   11/19/2015    Problem List Items Addressed This Visit      Cardiovascular and Mediastinum   Type 2 diabetes, controlled, with peripheral circulatory disorder (HCC)   Relevant Medications   amLODipine (NORVASC) 5 MG tablet   Other Relevant Orders   Hemoglobin A1c   Hypertension   Relevant Medications   amLODipine (NORVASC) 5 MG tablet     Other   Medicare annual wellness visit, subsequent - Primary     Reviewed and updated patient's medical, surgical, family and social history. Medications and allergies were also reviewed. Basic screenings for depression, activities of daily living, hearing, cognition and safety were performed. Provider list was updated and health plan was provided to the patient.      Routine general medical examination at a health care facility    1) Anticipatory Guidance: Discussed importance of wearing a seatbelt while driving and not texting while driving; changing batteries in smoke detector at least once annually; wearing suntan lotion when outside; eating a balanced  and moderate diet; getting physical activity at least 30 minutes per day.  2) Immunizations / Screenings / Labs:  Declines Zostavax. All other immunizations are up-to-date per recommendations. Obtain PSA for prostate cancer screening. Due for a dental screening encouraged to be completed independently. Routine EKG is abnormal with sinus rhythm with ectopic beats and slightly irregular. Per cardiology, no ischemic work up at this time. All other screenings are up-to-date per recommendations or has aged out. Obtain CBC, CMET, and Lipid profile.    Overall well exam with risk factors for cardiovascular disease including dyslipidemia, hypertension, coronary artery disease, and diabetes. Blood pressure remains elevated above goal 140/90 with current regimen. Continue current dosage of hydrochlorothiazide. Start amlodipine. Dyslipidemia and diabetes status will be determined through blood work today. Continue current dosage of medications as prescribed. MMSE concerning for mild dementia and working with neurology. His mood appears improved today although tearful at times. Does have some difficulty performing activities of daily living independently secondary to cognitive dysfunction which may require further assistance in the near future. We'll continue to monitor at this time. Continue other healthy lifestyle behaviors and  choices. Follow-up prevention exam in 1 year. Follow-up office visit for chronic conditions.       Relevant Orders   EKG 12-Lead (Completed)   CBC   Comprehensive metabolic panel   Lipid panel   PSA     I am having Mr. Violett start on amLODipine. I am also having him maintain his aspirin EC, Loratadine, multivitamin with minerals, metFORMIN, simvastatin, hydrochlorothiazide, and escitalopram.  Return in about 3 months (around 02/18/2016).

## 2015-11-24 ENCOUNTER — Other Ambulatory Visit (INDEPENDENT_AMBULATORY_CARE_PROVIDER_SITE_OTHER): Payer: Commercial Managed Care - HMO

## 2015-11-24 DIAGNOSIS — Z0000 Encounter for general adult medical examination without abnormal findings: Secondary | ICD-10-CM

## 2015-11-24 DIAGNOSIS — E1151 Type 2 diabetes mellitus with diabetic peripheral angiopathy without gangrene: Secondary | ICD-10-CM

## 2015-11-24 DIAGNOSIS — R7989 Other specified abnormal findings of blood chemistry: Secondary | ICD-10-CM

## 2015-11-24 LAB — LIPID PANEL
Cholesterol: 188 mg/dL (ref 0–200)
HDL: 36.2 mg/dL — ABNORMAL LOW (ref >39.00)
NONHDL: 152.03
TRIGLYCERIDES: 283 mg/dL — AB (ref 0.0–149.0)
Total CHOL/HDL Ratio: 5
VLDL: 56.6 mg/dL — ABNORMAL HIGH (ref 0.0–40.0)

## 2015-11-24 LAB — COMPREHENSIVE METABOLIC PANEL
ALK PHOS: 43 U/L (ref 39–117)
ALT: 21 U/L (ref 0–53)
AST: 25 U/L (ref 0–37)
Albumin: 4.4 g/dL (ref 3.5–5.2)
BILIRUBIN TOTAL: 0.5 mg/dL (ref 0.2–1.2)
BUN: 13 mg/dL (ref 6–23)
CO2: 36 meq/L — AB (ref 19–32)
Calcium: 10.3 mg/dL (ref 8.4–10.5)
Chloride: 96 mEq/L (ref 96–112)
Creatinine, Ser: 1.23 mg/dL (ref 0.40–1.50)
GFR: 70.83 mL/min (ref >60.00)
GLUCOSE: 230 mg/dL — AB (ref 70–99)
Potassium: 4.4 mEq/L (ref 3.5–5.1)
SODIUM: 140 meq/L (ref 135–145)
TOTAL PROTEIN: 7 g/dL (ref 6.0–8.3)

## 2015-11-24 LAB — CBC
HCT: 39.3 % (ref 39.0–52.0)
Hemoglobin: 13.2 g/dL (ref 13.0–17.0)
MCHC: 33.7 g/dL (ref 30.0–36.0)
MCV: 92.2 fl (ref 78.0–100.0)
Platelets: 199 10*3/uL (ref 150.0–400.0)
RBC: 4.26 Mil/uL (ref 4.22–5.81)
RDW: 13 % (ref 11.5–15.5)
WBC: 6.4 10*3/uL (ref 4.0–10.5)

## 2015-11-24 LAB — HEMOGLOBIN A1C: Hgb A1c MFr Bld: 7.9 % — ABNORMAL HIGH (ref 4.6–6.5)

## 2015-11-24 LAB — LDL CHOLESTEROL, DIRECT: LDL DIRECT: 92 mg/dL

## 2015-11-24 LAB — PSA: PSA: 6.24 ng/mL — AB (ref 0.10–4.00)

## 2015-11-26 ENCOUNTER — Telehealth: Payer: Self-pay | Admitting: Family

## 2015-11-26 NOTE — Telephone Encounter (Signed)
Please inform patient that his blood work shows that his white/red blood cells, kidney function, liver function and electrolytes are within normal limits. His cholesterol numbers look okay. His A1c has elevated to 7.9. Please check to see that he is taking the metformin as based on refills he should be out of medication. We will either restart it or increase it. Please continue other medications as prescribed.

## 2015-11-28 ENCOUNTER — Telehealth: Payer: Self-pay | Admitting: Family

## 2015-11-28 NOTE — Telephone Encounter (Signed)
LVM for pt to call back.

## 2015-11-28 NOTE — Telephone Encounter (Signed)
LVM with results

## 2015-11-28 NOTE — Telephone Encounter (Signed)
States can leave vm if does not answer

## 2015-11-28 NOTE — Telephone Encounter (Signed)
Daughter is requesting call back with lab results.

## 2015-11-28 NOTE — Telephone Encounter (Signed)
Left results on VM per daughter.

## 2015-12-04 ENCOUNTER — Encounter (HOSPITAL_COMMUNITY): Payer: Self-pay | Admitting: Emergency Medicine

## 2015-12-04 DIAGNOSIS — Z8669 Personal history of other diseases of the nervous system and sense organs: Secondary | ICD-10-CM | POA: Insufficient documentation

## 2015-12-04 DIAGNOSIS — Y9289 Other specified places as the place of occurrence of the external cause: Secondary | ICD-10-CM | POA: Insufficient documentation

## 2015-12-04 DIAGNOSIS — Y998 Other external cause status: Secondary | ICD-10-CM | POA: Diagnosis not present

## 2015-12-04 DIAGNOSIS — S0990XA Unspecified injury of head, initial encounter: Secondary | ICD-10-CM | POA: Insufficient documentation

## 2015-12-04 DIAGNOSIS — E119 Type 2 diabetes mellitus without complications: Secondary | ICD-10-CM | POA: Diagnosis not present

## 2015-12-04 DIAGNOSIS — Z7982 Long term (current) use of aspirin: Secondary | ICD-10-CM | POA: Diagnosis not present

## 2015-12-04 DIAGNOSIS — E785 Hyperlipidemia, unspecified: Secondary | ICD-10-CM | POA: Diagnosis not present

## 2015-12-04 DIAGNOSIS — M1990 Unspecified osteoarthritis, unspecified site: Secondary | ICD-10-CM | POA: Diagnosis not present

## 2015-12-04 DIAGNOSIS — Y93H2 Activity, gardening and landscaping: Secondary | ICD-10-CM | POA: Diagnosis not present

## 2015-12-04 DIAGNOSIS — Z79899 Other long term (current) drug therapy: Secondary | ICD-10-CM | POA: Insufficient documentation

## 2015-12-04 DIAGNOSIS — I10 Essential (primary) hypertension: Secondary | ICD-10-CM | POA: Insufficient documentation

## 2015-12-04 DIAGNOSIS — S199XXA Unspecified injury of neck, initial encounter: Secondary | ICD-10-CM | POA: Diagnosis not present

## 2015-12-04 DIAGNOSIS — F329 Major depressive disorder, single episode, unspecified: Secondary | ICD-10-CM | POA: Diagnosis not present

## 2015-12-04 DIAGNOSIS — S0101XA Laceration without foreign body of scalp, initial encounter: Secondary | ICD-10-CM | POA: Insufficient documentation

## 2015-12-04 DIAGNOSIS — Z87891 Personal history of nicotine dependence: Secondary | ICD-10-CM | POA: Diagnosis not present

## 2015-12-04 DIAGNOSIS — W01198A Fall on same level from slipping, tripping and stumbling with subsequent striking against other object, initial encounter: Secondary | ICD-10-CM | POA: Diagnosis not present

## 2015-12-04 DIAGNOSIS — Z87448 Personal history of other diseases of urinary system: Secondary | ICD-10-CM | POA: Diagnosis not present

## 2015-12-04 DIAGNOSIS — Z9889 Other specified postprocedural states: Secondary | ICD-10-CM | POA: Insufficient documentation

## 2015-12-04 DIAGNOSIS — I2510 Atherosclerotic heart disease of native coronary artery without angina pectoris: Secondary | ICD-10-CM | POA: Insufficient documentation

## 2015-12-04 DIAGNOSIS — R42 Dizziness and giddiness: Secondary | ICD-10-CM | POA: Diagnosis not present

## 2015-12-04 NOTE — ED Notes (Signed)
Pt st's he was trimming bushes and tripped over a brick and fell backwards.  Pt has lac. To back of scalp.  Pt is not on any blood thinners.

## 2015-12-05 ENCOUNTER — Emergency Department (HOSPITAL_COMMUNITY)
Admission: EM | Admit: 2015-12-05 | Discharge: 2015-12-05 | Disposition: A | Discharge status: Home / Self Care | Payer: Commercial Managed Care - HMO | Attending: Emergency Medicine | Admitting: Emergency Medicine

## 2015-12-05 ENCOUNTER — Other Ambulatory Visit: Payer: Self-pay | Admitting: Internal Medicine

## 2015-12-05 ENCOUNTER — Emergency Department (HOSPITAL_COMMUNITY): Payer: Commercial Managed Care - HMO

## 2015-12-05 DIAGNOSIS — S199XXA Unspecified injury of neck, initial encounter: Secondary | ICD-10-CM | POA: Diagnosis not present

## 2015-12-05 DIAGNOSIS — R42 Dizziness and giddiness: Secondary | ICD-10-CM | POA: Diagnosis not present

## 2015-12-05 DIAGNOSIS — S0101XA Laceration without foreign body of scalp, initial encounter: Secondary | ICD-10-CM

## 2015-12-05 DIAGNOSIS — W010XXA Fall on same level from slipping, tripping and stumbling without subsequent striking against object, initial encounter: Secondary | ICD-10-CM

## 2015-12-05 NOTE — ED Provider Notes (Signed)
CSN: [id number]     Arrival date & time 12/04/15  2244 History   First MD Initiated Contact with Patient 12/05/15 0111     Chief Complaint  Patient presents with  . Laceration     (Consider location/radiation/quality/duration/timing/severity/associated sxs/prior Treatment) The history is provided by the patient, medical records and a relative. No language interpreter was used.     [name] is a 82 y.o. male  with a hx of Nephritis, non-insulin-dependent diabetes, hypertension, CAD presents to the Emergency Department complaining of acute, persistent laceration to the posterior scalp occurring sometime after 5:30 PM.  Patient lives at home with his daughter. Daughter bedside reports that he was fine when she called to check on him at her lunch break around 5:30 PM.  He reports that he went outside after dinner to trim some bushes when he tripped over a brick and fell backwards. He reports that he struck his head on an additional break. His last tetanus shot was given in December 2016. He denies taking any blood thinners but does take 1 baby aspirin per day.  Hemostasis achieved. No aggravating or alleviating factors.  Patient is alert and oriented 4 but daughter reports the patient's hands a little bit more "off." She is unable to be more specific.  Patient is able to perform his own ADLs without difficulty at baseline.   Past Medical History  Diagnosis Date  . Arthritis   . Type 2 diabetes mellitus ([place])   . Depression   . Dyslipidemia   . Hypertension   . CAD (coronary artery disease)     s/p DES to LAD 06/2013  . Glaucoma   . Hyperglycemia 04/17/2014   Past Surgical History  Procedure Laterality Date  . Cardiac catheterization  06/2013    DES to LAD by Dr Tamala Julian  . Right cataract     Family History  Problem Relation Age of Onset  . Arthritis Mother   . Breast cancer Mother   . Arthritis Father   . Diabetes Other   . Arthritis Other   . Colon cancer Other   . Stroke  Other    Social History  Substance Use Topics  . Smoking status: Former Smoker    Types: Cigarettes    Quit date: 08/02/1950  . Smokeless tobacco: Never Used  . Alcohol Use: No    Review of Systems  Constitutional: Negative for fever, diaphoresis, appetite change, fatigue and unexpected weight change.  HENT: Negative for mouth sores.   Eyes: Negative for visual disturbance.  Respiratory: Negative for cough, chest tightness, shortness of breath and wheezing.   Cardiovascular: Negative for chest pain.  Gastrointestinal: Negative for nausea, vomiting, abdominal pain, diarrhea and constipation.  Endocrine: Negative for polydipsia, polyphagia and polyuria.  Genitourinary: Negative for dysuria, urgency, frequency and hematuria.  Musculoskeletal: Negative for back pain and neck stiffness.  Skin: Positive for wound. Negative for rash.  Allergic/Immunologic: Negative for immunocompromised state.  Neurological: Negative for syncope, light-headedness and headaches.  Hematological: Does not bruise/bleed easily.  Psychiatric/Behavioral: Negative for sleep disturbance. The patient is not nervous/anxious.       Allergies  Review of patient's allergies indicates no known allergies.  Home Medications   Prior to Admission medications   Medication Sig Start Date End Date Taking? Authorizing Provider  amLODipine (NORVASC) 5 MG tablet Take 1 tablet (5 mg total) by mouth daily. 11/19/15  Yes Golden Circle, FNP  aspirin EC 81 MG tablet Take 1 tablet (81 mg total) by  mouth daily. 06/22/13  Yes Rowe Clack, MD  escitalopram (LEXAPRO) 5 MG tablet Take 1 tablet daily 09/19/15  Yes Cameron Sprang, MD  hydrochlorothiazide (HYDRODIURIL) 25 MG tablet TAKE 1 TABLET EVERY DAY 09/18/15  Yes Aleksei Plotnikov V, MD  Loratadine 10 MG CAPS Take 1 capsule by mouth daily.   Yes Historical Provider, MD  Multiple Vitamin (MULTIVITAMIN WITH MINERALS) TABS tablet Take 1 tablet by mouth daily.   Yes Historical  Provider, MD  simvastatin (ZOCOR) 20 MG tablet TAKE 1 TABLET EVERY DAY 08/27/15  Yes Hoyt Koch, MD   BP 148/93 mmHg  Pulse 84  Temp(Src) 97.9 F (36.6 C) (Oral)  Resp 16  SpO2 96% Physical Exam  Constitutional: He is oriented to person, place, and time. He appears well-developed and well-nourished. No distress.  HENT:  Head: Normocephalic.  Right Ear: Tympanic membrane, external ear and ear canal normal.  Left Ear: Tympanic membrane, external ear and ear canal normal.  Nose: Nose normal. No epistaxis. Right sinus exhibits no maxillary sinus tenderness and no frontal sinus tenderness. Left sinus exhibits no maxillary sinus tenderness and no frontal sinus tenderness.  Mouth/Throat: Uvula is midline, oropharynx is clear and moist and mucous membranes are normal. Mucous membranes are not pale and not cyanotic. No oropharyngeal exudate, posterior oropharyngeal edema, posterior oropharyngeal erythema or tonsillar abscesses.  Large contusion to the posterior occiput with 5 cm laceration; hemostasis achieved  Eyes: Conjunctivae and EOM are normal. Pupils are equal, round, and reactive to light. No scleral icterus.  No horizontal, vertical or rotational nystagmus  Neck: Normal range of motion and full passive range of motion without pain. Neck supple.  Full active and passive ROM without pain No midline or paraspinal tenderness No nuchal rigidity or meningeal signs  Cardiovascular: Normal rate, regular rhythm, normal heart sounds and intact distal pulses.   No murmur heard. Pulmonary/Chest: Effort normal and breath sounds normal. No stridor. No respiratory distress. He has no wheezes. He has no rales.  Clear and equal breath sounds without focal wheezes, rhonchi, rales  Abdominal: Soft. Bowel sounds are normal. He exhibits no distension. There is no tenderness. There is no rebound and no guarding.  Musculoskeletal: Normal range of motion.  Full range of motion of the T-spine and  L-spine No tenderness to palpation of the spinous processes of the T-spine or L-spine No tenderness to palpation of the paraspinous muscles of the T-spine or L-spine  Lymphadenopathy:    He has no cervical adenopathy.  Neurological: He is alert and oriented to person, place, and time. He has normal reflexes. No cranial nerve deficit. He exhibits normal muscle tone. Coordination normal.  Reflex Scores:      Bicep reflexes are 2+ on the right side and 2+ on the left side.      Brachioradialis reflexes are 2+ on the right side and 2+ on the left side.      Patellar reflexes are 2+ on the right side and 2+ on the left side.      Achilles reflexes are 2+ on the right side and 2+ on the left side. Mental Status:  Alert, oriented, thought content appropriate. Speech fluent without evidence of aphasia. Able to follow 2 step commands without difficulty.  Cranial Nerves:  II:  Peripheral visual fields grossly normal, pupils equal, round, reactive to light III,IV, VI: ptosis not present, extra-ocular motions intact bilaterally  V,VII: smile symmetric, facial light touch sensation equal VIII: hearing grossly normal bilaterally  IX,X: midline  uvula rise  XI: bilateral shoulder shrug equal and strong XII: midline tongue extension  Motor:  5/5 in upper and lower extremities bilaterally including strong and equal grip strength and dorsiflexion/plantar flexion Sensory: Pinprick and light touch normal in all extremities.  Deep Tendon Reflexes: 2+ and symmetric  Cerebellar: normal finger-to-nose with bilateral upper extremities Gait: normal gait.  Pt seems off balance upon initially standing but is able to walk without balance issues. CV: distal pulses palpable throughout   Skin: Skin is warm and dry. No rash noted. He is not diaphoretic. No erythema.  Psychiatric: He has a normal mood and affect. His behavior is normal. Judgment and thought content normal.  Nursing note and vitals reviewed.   ED  Course  .Marland KitchenLaceration Repair Date/Time: 12/05/2015 2:45 AM Performed by: Abigail Butts Authorized by: Abigail Butts Consent: Verbal consent obtained. Risks and benefits: risks, benefits and alternatives were discussed Consent given by: patient Patient understanding: patient states understanding of the procedure being performed Patient consent: the patient's understanding of the procedure matches consent given Procedure consent: procedure consent matches procedure scheduled Relevant documents: relevant documents present and verified Site marked: the operative site was marked Imaging studies: imaging studies available Required items: required blood products, implants, devices, and special equipment available Patient identity confirmed: verbally with patient and arm band Time out: Immediately prior to procedure a "time out" was called to verify the correct patient, procedure, equipment, support staff and site/side marked as required. Body area: head/neck Location details: scalp Laceration length: 5 cm Foreign bodies: no foreign bodies Tendon involvement: none Nerve involvement: none Vascular damage: no Patient sedated: no Preparation: Patient was prepped and draped in the usual sterile fashion. Irrigation solution: saline Irrigation method: syringe Amount of cleaning: extensive Skin closure: staples Approximation: close Approximation difficulty: complex Dressing: 4x4 sterile gauze Patient tolerance: Patient tolerated the procedure well with no immediate complications   (including critical care time) Labs Review Labs Reviewed - No data to display  Imaging Review Ct Head Wo Contrast  12/05/2015  CLINICAL DATA:  Fall, confusion.  History of hypertension, diabetes. EXAM: CT HEAD WITHOUT CONTRAST CT CERVICAL SPINE WITHOUT CONTRAST TECHNIQUE: Multidetector CT imaging of the head and cervical spine was performed following the standard protocol without intravenous contrast.  Multiplanar CT image reconstructions of the cervical spine were also generated. COMPARISON:  CT HEAD July 30, 2015 FINDINGS: CT HEAD FINDINGS INTRACRANIAL CONTENTS: The ventricles and sulci are normal for age. No intraparenchymal hemorrhage, mass effect nor midline shift. Patchy supratentorial white matter hypodensities are within normal range for patient's age and though non-specific likely represent chronic small vessel ischemic disease. LEFT inferior frontal lobe encephalomalacia. No acute large vascular territory infarcts. New low-density RIGHT greater than LEFT subdural fluid collections measuring up to 2 mm. Basal cisterns are patent. Moderate calcific atherosclerosis of the carotid siphons. ORBITS: The included ocular globes and orbital contents are non-suspicious. Status post bilateral ocular lens implants. Old LEFT medial orbital blowout fracture. SINUSES: Moderate paranasal sinus mucosal thickening without air-fluid levels. The mastoid air cells are well aerated. SKULL/SOFT TISSUES: Small RIGHT parietal occipital scalp hematoma without subcutaneous gas or radiopaque foreign bodies. Patient is edentulous. CT CERVICAL SPINE FINDINGS OSSEOUS STRUCTURES: Cervical vertebral bodies intact and aligned with straightened cervical lordosis. Moderate C3-4 thru C6-7 disc height loss, uncovertebral hypertrophy and endplate sclerosis compatible with degenerative disc with multilevel intradiscal calcifications. C1-2 articulation maintained with moderate osteoarthrosis. Calcified pannus about the odontoid process. No destructive bony lesions. Moderate canal stenosis C3-4, C4-5 and mild  at C5-6 and C6-7. Severe RIGHT C5-6 and LEFT C3-4 neural foraminal narrowing, moderate to severe bilaterally at C6-7. SOFT TISSUES: Included prevertebral and paraspinal soft tissues are not acute. Mild calcific atherosclerosis of the carotid bulbs. IMPRESSION: CT HEAD: No acute intracranial process. Minimal new chronic appearing  subdural hematomas without mass effect. LEFT frontal lobe encephalomalacia most compatible with old trauma. CT CERVICAL SPINE: Straightened cervical lordosis without acute fracture or malalignment. Moderate canal stenosis C3-4 and C4-5. Severe rib LEFT C3-4 and RIGHT C5-6 neural foraminal narrowing. Electronically Signed   By: Elon Alas M.D.   On: 12/05/2015 02:27   Ct Cervical Spine Wo Contrast  12/05/2015  CLINICAL DATA:  Fall, confusion.  History of hypertension, diabetes. EXAM: CT HEAD WITHOUT CONTRAST CT CERVICAL SPINE WITHOUT CONTRAST TECHNIQUE: Multidetector CT imaging of the head and cervical spine was performed following the standard protocol without intravenous contrast. Multiplanar CT image reconstructions of the cervical spine were also generated. COMPARISON:  CT HEAD July 30, 2015 FINDINGS: CT HEAD FINDINGS INTRACRANIAL CONTENTS: The ventricles and sulci are normal for age. No intraparenchymal hemorrhage, mass effect nor midline shift. Patchy supratentorial white matter hypodensities are within normal range for patient's age and though non-specific likely represent chronic small vessel ischemic disease. LEFT inferior frontal lobe encephalomalacia. No acute large vascular territory infarcts. New low-density RIGHT greater than LEFT subdural fluid collections measuring up to 2 mm. Basal cisterns are patent. Moderate calcific atherosclerosis of the carotid siphons. ORBITS: The included ocular globes and orbital contents are non-suspicious. Status post bilateral ocular lens implants. Old LEFT medial orbital blowout fracture. SINUSES: Moderate paranasal sinus mucosal thickening without air-fluid levels. The mastoid air cells are well aerated. SKULL/SOFT TISSUES: Small RIGHT parietal occipital scalp hematoma without subcutaneous gas or radiopaque foreign bodies. Patient is edentulous. CT CERVICAL SPINE FINDINGS OSSEOUS STRUCTURES: Cervical vertebral bodies intact and aligned with straightened  cervical lordosis. Moderate C3-4 thru C6-7 disc height loss, uncovertebral hypertrophy and endplate sclerosis compatible with degenerative disc with multilevel intradiscal calcifications. C1-2 articulation maintained with moderate osteoarthrosis. Calcified pannus about the odontoid process. No destructive bony lesions. Moderate canal stenosis C3-4, C4-5 and mild at C5-6 and C6-7. Severe RIGHT C5-6 and LEFT C3-4 neural foraminal narrowing, moderate to severe bilaterally at C6-7. SOFT TISSUES: Included prevertebral and paraspinal soft tissues are not acute. Mild calcific atherosclerosis of the carotid bulbs. IMPRESSION: CT HEAD: No acute intracranial process. Minimal new chronic appearing subdural hematomas without mass effect. LEFT frontal lobe encephalomalacia most compatible with old trauma. CT CERVICAL SPINE: Straightened cervical lordosis without acute fracture or malalignment. Moderate canal stenosis C3-4 and C4-5. Severe rib LEFT C3-4 and RIGHT C5-6 neural foraminal narrowing. Electronically Signed   By: Elon Alas M.D.   On: 12/05/2015 02:27   I have personally reviewed and evaluated these images and lab results as part of my medical decision-making.    MDM   Final diagnoses:  Fall from slip, trip, or stumble, initial encounter  Scalp laceration, initial encounter   Devontea Bonno presents with laceration to the posterior occiput after fall.  CT head shows "Minimal new chronic appearing subdural hematomas without mass effect."  Discussed with radiology who believes this is likely from a previous fall and does not believe this is acute.  Pt with normal neurologic exam.  Pt remains A&Ox4 throughout the time in the ED.  No vomiting.    Pressure irrigation performed. Wound explored and base of wound visualized in a bloodless field without evidence of foreign body.  Laceration  occurred < 8 hours prior to repair which was well tolerated. Tdap up to date.  Pt has no comorbidities to effect normal  wound healing. Pt discharged without antibiotics.  Discussed suture home care with patient and answered questions. Pt to follow-up for wound check and suture removal in 7 days; they are to return to the ED sooner for signs of infection. Pt is hemodynamically stable with no complaints prior to dc.   The patient was discussed with and seen by Dr. Stark Jock who agrees with the treatment plan.    Jarrett Soho Shaday Rayborn, PA-C 12/05/15 [id number]  Veryl Speak, MD 12/05/15 [phone]

## 2015-12-05 NOTE — Discharge Instructions (Signed)
1. Medications: Tylenol or ibuprofen for pain, usual home medications ° °2. Treatment: ice for swelling, keep wound clean with warm soap and water and keep bandage dry, do not submerge in water for 24 hours ° °3. Follow Up: Please return in 5 days to have your stitches/staples removed or sooner if you have concerns. Return to the emergency department for increased redness, drainage of pus from the wound ° ° °WOUND CARE ° Keep area clean and dry for 24 hours. Do not remove bandage, if applied. ° After 24 hours, remove bandage and wash wound gently with mild soap and warm water. Reapply a new bandage after cleaning wound, if directed.  ° Continue daily cleansing with soap and water until stitches/staples are removed. ° Do not apply any ointments or creams to the wound while stitches/staples are in place, as this may cause delayed healing. °Return if you experience any of the following signs of infection: Swelling, redness, pus drainage, streaking, fever >101.0 F ° Return if you experience excessive bleeding that does not stop after 15-20 minutes of constant, firm pressure. ° °

## 2015-12-09 ENCOUNTER — Ambulatory Visit (INDEPENDENT_AMBULATORY_CARE_PROVIDER_SITE_OTHER): Payer: Commercial Managed Care - HMO | Admitting: Family

## 2015-12-09 ENCOUNTER — Encounter: Payer: Self-pay | Admitting: Family

## 2015-12-09 VITALS — BP 142/64 | HR 85 | Temp 98.3°F | Resp 16 | Ht 71.0 in | Wt 173.0 lb

## 2015-12-09 DIAGNOSIS — S0191XD Laceration without foreign body of unspecified part of head, subsequent encounter: Secondary | ICD-10-CM

## 2015-12-09 DIAGNOSIS — S0191XA Laceration without foreign body of unspecified part of head, initial encounter: Secondary | ICD-10-CM | POA: Insufficient documentation

## 2015-12-09 NOTE — Assessment & Plan Note (Signed)
Laceration of head appears well healed with no evidence of infection. Staples removed without complication with post-care instructions provided. Follow up for worsening or signs of infection.

## 2015-12-09 NOTE — Progress Notes (Signed)
Pre visit review using our clinic review tool, if applicable. No additional management support is needed unless otherwise documented below in the visit note. 

## 2015-12-09 NOTE — Progress Notes (Signed)
Subjective:    Patient ID: [name], male    DOB: 1921/12/09, 82 y.o.   MRN: [id number]  Chief Complaint  Patient presents with  . Suture / Staple Removal    HPI:  [name] is a 82 y.o. male who  has a past medical history of Arthritis; Type 2 diabetes mellitus ([place]); Depression; Dyslipidemia; Hypertension; CAD (coronary artery disease); Glaucoma; and Hyperglycemia (04/17/2014). and presents today for an office visit.  Recently evaluated in the ED following a fall Where he was walking and tripped over a brick falling backwards. Notes striking his head on a brick and was noted to be alert and oriented but he seems slightly off per family. Wound was cleansed and closed with 5 staples. CT scan of the head showed no acute intracranial processes with minimal new chronic appearing subdural hematomas without mass effect and a left frontal lobe encephalomalacia most, compatible with old trauma. CT of the cervical spine showed straightened cervical lordosis without acute fracture or malalignment and moderate canal stenosis of C3/C4 and C4/C5. He was discharged without antibiotics and instructed on home follow-up care and to follow up with primary care.  Since leaving the emergency room he reports no evidence of headache, altered/changes in mental status. Denies fevers or signs of infection. Reports keeping the wound clean and dry as instructed. He is here to have staples removed.   No Known Allergies   Current Outpatient Prescriptions on File Prior to Visit  Medication Sig Dispense Refill  . amLODipine (NORVASC) 5 MG tablet Take 1 tablet (5 mg total) by mouth daily. 30 tablet 1  . aspirin EC 81 MG tablet Take 1 tablet (81 mg total) by mouth daily. 150 tablet 2  . escitalopram (LEXAPRO) 5 MG tablet Take 1 tablet daily 30 tablet 11  . hydrochlorothiazide (HYDRODIURIL) 25 MG tablet TAKE 1 TABLET EVERY DAY 90 tablet 3  . Loratadine 10 MG CAPS Take 1 capsule by mouth daily.    . metFORMIN  (GLUCOPHAGE) 500 MG tablet TAKE 1 TABLET TWICE DAILY WITH MEALS 180 tablet 3  . Multiple Vitamin (MULTIVITAMIN WITH MINERALS) TABS tablet Take 1 tablet by mouth daily.    . simvastatin (ZOCOR) 20 MG tablet TAKE 1 TABLET EVERY DAY 90 tablet 2   No current facility-administered medications on file prior to visit.    Review of Systems  Constitutional: Negative for fever and chills.  Skin: Positive for wound.      Objective:    BP 142/64 mmHg  Pulse 85  Temp(Src) 98.3 F (36.8 C) (Oral)  Resp 16  Ht 5\' 11"  (1.803 m)  Wt 173 lb (78.472 kg)  BMI 24.14 kg/m2  SpO2 94% Nursing note and vital signs reviewed.  Physical Exam  Constitutional: He is oriented to person, place, and time. He appears well-developed and well-nourished. No distress.  HENT:  5 cm laceration appears well healed with 5 staples in place and no evidence of infection.   Cardiovascular: Normal rate, regular rhythm, normal heart sounds and intact distal pulses.   Pulmonary/Chest: Effort normal and breath sounds normal.  Neurological: He is alert and oriented to person, place, and time.  Skin: Skin is warm and dry.  Psychiatric: He has a normal mood and affect. His behavior is normal. Judgment and thought content normal.       Assessment & Plan:   Problem List Items Addressed This Visit      Other   Laceration of head - Primary    Laceration  of head appears well healed with no evidence of infection. Staples removed without complication with post-care instructions provided. Follow up for worsening or signs of infection.           I am having Mr. Taite maintain his aspirin EC, Loratadine, multivitamin with minerals, simvastatin, hydrochlorothiazide, escitalopram, amLODipine, and metFORMIN.   Follow-up: Return if symptoms worsen or fail to improve.  Mauricio Po, FNP

## 2015-12-09 NOTE — Patient Instructions (Addendum)
Thank you for choosing [facility].  Summary/Instructions:  Please continue to clean with soap and water.   Avoid scrubbing/rubbing the site.  Monitor for signs of infection .   Ice to your shoulder and knee as needed.  Tylenol as needed for discomfort.   If your symptoms worsen or fail to improve, please contact our office for further instruction, or in case of emergency go directly to the emergency room at the closest medical facility.   Wound Closure Removal The staples, stitches, or skin adhesives that were used to close your skin have been removed. You will need to continue the care described here until the wound is completely healed and your health care provider confirms that wound care can be stopped. HOW DO I CARE FOR MY WOUND? How you care for your wound after the wound closure has been removed depends on the kind of wound closure you had. Stitches or Staples  Keep the wound site dry and clean. Do not soak it in water.  If skin adhesive strips were applied after the staples were removed, they will begin to peel off in a few days. Allow them to remain in place until they fall off on their own.  If you still have a bandage (dressing), change it at least once a day or as directed by your health care provider. If the dressing sticks, pour warm, sterile water over it until it loosens and can be removed without pulling apart the wound edges. Pat the area dry with a soft, clean towel. Do not rub the wound because that may cause bleeding.  Apply cream or ointment that stops the growth of bacteria (antibacterial cream or antibacterial ointment) only if your health care provider has directed you to do so.  Place a nonstick bandage over the wound to prevent the dressing from sticking.  Cover the nonstick bandage with a new dressing as directed by your health care provider.  If the bandage becomes wet or dirty or it develops a bad smell, change it as soon as possible.  Take  medicines only as directed by your health care provider. Adhesive Strips or Glue  Adhesive strips and glue peel off on their own.  Leave adhesive strips and glue in place until they fall off. ARE THERE ANY BATHING RESTRICTIONS ONCE MY WOUND CLOSURE IS REMOVED? Do not take baths, swim, or use a hot tub until your health care provider approves. HOW CAN I DECREASE THE SIZE OF MY SCAR? How your scar heals and the size of your scar depend on many factors, such as your age, the type of scar you have, and genetic factors. The following may help decrease the size of your scar:  Sunscreen. Use sunscreen with a sun protection factor (SPF) of at least 15 when out in the sun. Reapply the sunscreen every two hours.  Friction massage. Once your wound is completely healed, you can gently massage the scarred area. This can decrease scar thickness. WHEN SHOULD I SEEK HELP?  Seek help if:  You have a fever.  You have chills.  You have drainage, redness, swelling, or pain at your wound.  There is a bad smell coming from your wound.  Your wound edges open up or do not stay closed after the wound closure has been removed.   This information is not intended to replace advice given to you by your health care provider. Make sure you discuss any questions you have with your health care provider.   Document Released:  07/01/2008 Document Revised: 08/09/2014 Document Reviewed: 12/04/2013 Elsevier Interactive Patient Education [employer].

## 2016-01-02 ENCOUNTER — Encounter: Payer: Self-pay | Admitting: Neurology

## 2016-01-02 ENCOUNTER — Telehealth: Payer: Self-pay | Admitting: Family

## 2016-01-02 DIAGNOSIS — R413 Other amnesia: Secondary | ICD-10-CM

## 2016-01-02 DIAGNOSIS — R296 Repeated falls: Secondary | ICD-10-CM

## 2016-01-02 NOTE — Telephone Encounter (Signed)
Pt daughter called in and wanted to know the status of a home health nurse coming out.  Do you know if there was suppose to be a home health nurse coming to the home?

## 2016-01-05 NOTE — Telephone Encounter (Signed)
Does this patient need home health?

## 2016-01-05 NOTE — Telephone Encounter (Signed)
Forwarding to PCP.

## 2016-01-05 NOTE — Telephone Encounter (Signed)
Order placed and should be hearing back shortly.

## 2016-01-09 ENCOUNTER — Ambulatory Visit: Payer: Commercial Managed Care - HMO | Admitting: Internal Medicine

## 2016-01-14 ENCOUNTER — Other Ambulatory Visit: Payer: Self-pay | Admitting: Family

## 2016-01-16 DIAGNOSIS — E119 Type 2 diabetes mellitus without complications: Secondary | ICD-10-CM | POA: Diagnosis not present

## 2016-01-16 DIAGNOSIS — R262 Difficulty in walking, not elsewhere classified: Secondary | ICD-10-CM | POA: Diagnosis not present

## 2016-01-16 DIAGNOSIS — M6281 Muscle weakness (generalized): Secondary | ICD-10-CM | POA: Diagnosis not present

## 2016-01-16 DIAGNOSIS — I10 Essential (primary) hypertension: Secondary | ICD-10-CM | POA: Diagnosis not present

## 2016-01-16 DIAGNOSIS — Z9181 History of falling: Secondary | ICD-10-CM | POA: Diagnosis not present

## 2016-01-20 DIAGNOSIS — M6281 Muscle weakness (generalized): Secondary | ICD-10-CM | POA: Diagnosis not present

## 2016-01-20 DIAGNOSIS — Z9181 History of falling: Secondary | ICD-10-CM | POA: Diagnosis not present

## 2016-01-20 DIAGNOSIS — I10 Essential (primary) hypertension: Secondary | ICD-10-CM | POA: Diagnosis not present

## 2016-01-20 DIAGNOSIS — R262 Difficulty in walking, not elsewhere classified: Secondary | ICD-10-CM | POA: Diagnosis not present

## 2016-01-20 DIAGNOSIS — E119 Type 2 diabetes mellitus without complications: Secondary | ICD-10-CM | POA: Diagnosis not present

## 2016-01-22 DIAGNOSIS — M6281 Muscle weakness (generalized): Secondary | ICD-10-CM | POA: Diagnosis not present

## 2016-01-22 DIAGNOSIS — I10 Essential (primary) hypertension: Secondary | ICD-10-CM | POA: Diagnosis not present

## 2016-01-22 DIAGNOSIS — R262 Difficulty in walking, not elsewhere classified: Secondary | ICD-10-CM | POA: Diagnosis not present

## 2016-01-22 DIAGNOSIS — E119 Type 2 diabetes mellitus without complications: Secondary | ICD-10-CM | POA: Diagnosis not present

## 2016-01-22 DIAGNOSIS — Z9181 History of falling: Secondary | ICD-10-CM | POA: Diagnosis not present

## 2016-01-27 DIAGNOSIS — Z9181 History of falling: Secondary | ICD-10-CM | POA: Diagnosis not present

## 2016-01-27 DIAGNOSIS — R262 Difficulty in walking, not elsewhere classified: Secondary | ICD-10-CM | POA: Diagnosis not present

## 2016-01-27 DIAGNOSIS — E119 Type 2 diabetes mellitus without complications: Secondary | ICD-10-CM | POA: Diagnosis not present

## 2016-01-27 DIAGNOSIS — I10 Essential (primary) hypertension: Secondary | ICD-10-CM | POA: Diagnosis not present

## 2016-01-27 DIAGNOSIS — M6281 Muscle weakness (generalized): Secondary | ICD-10-CM | POA: Diagnosis not present

## 2016-01-29 DIAGNOSIS — M6281 Muscle weakness (generalized): Secondary | ICD-10-CM | POA: Diagnosis not present

## 2016-01-29 DIAGNOSIS — Z9181 History of falling: Secondary | ICD-10-CM | POA: Diagnosis not present

## 2016-01-29 DIAGNOSIS — R262 Difficulty in walking, not elsewhere classified: Secondary | ICD-10-CM | POA: Diagnosis not present

## 2016-01-29 DIAGNOSIS — I10 Essential (primary) hypertension: Secondary | ICD-10-CM | POA: Diagnosis not present

## 2016-01-29 DIAGNOSIS — E119 Type 2 diabetes mellitus without complications: Secondary | ICD-10-CM | POA: Diagnosis not present

## 2016-02-03 DIAGNOSIS — E119 Type 2 diabetes mellitus without complications: Secondary | ICD-10-CM | POA: Diagnosis not present

## 2016-02-03 DIAGNOSIS — Z9181 History of falling: Secondary | ICD-10-CM | POA: Diagnosis not present

## 2016-02-03 DIAGNOSIS — I10 Essential (primary) hypertension: Secondary | ICD-10-CM | POA: Diagnosis not present

## 2016-02-03 DIAGNOSIS — M6281 Muscle weakness (generalized): Secondary | ICD-10-CM | POA: Diagnosis not present

## 2016-02-03 DIAGNOSIS — R262 Difficulty in walking, not elsewhere classified: Secondary | ICD-10-CM | POA: Diagnosis not present

## 2016-02-05 DIAGNOSIS — R262 Difficulty in walking, not elsewhere classified: Secondary | ICD-10-CM | POA: Diagnosis not present

## 2016-02-05 DIAGNOSIS — M6281 Muscle weakness (generalized): Secondary | ICD-10-CM | POA: Diagnosis not present

## 2016-02-05 DIAGNOSIS — E119 Type 2 diabetes mellitus without complications: Secondary | ICD-10-CM | POA: Diagnosis not present

## 2016-02-05 DIAGNOSIS — Z9181 History of falling: Secondary | ICD-10-CM | POA: Diagnosis not present

## 2016-02-05 DIAGNOSIS — I10 Essential (primary) hypertension: Secondary | ICD-10-CM | POA: Diagnosis not present

## 2016-02-10 DIAGNOSIS — E119 Type 2 diabetes mellitus without complications: Secondary | ICD-10-CM | POA: Diagnosis not present

## 2016-02-10 DIAGNOSIS — Z9181 History of falling: Secondary | ICD-10-CM | POA: Diagnosis not present

## 2016-02-10 DIAGNOSIS — M6281 Muscle weakness (generalized): Secondary | ICD-10-CM | POA: Diagnosis not present

## 2016-02-10 DIAGNOSIS — R262 Difficulty in walking, not elsewhere classified: Secondary | ICD-10-CM | POA: Diagnosis not present

## 2016-02-10 DIAGNOSIS — I10 Essential (primary) hypertension: Secondary | ICD-10-CM | POA: Diagnosis not present

## 2016-02-12 DIAGNOSIS — M6281 Muscle weakness (generalized): Secondary | ICD-10-CM | POA: Diagnosis not present

## 2016-02-12 DIAGNOSIS — Z9181 History of falling: Secondary | ICD-10-CM | POA: Diagnosis not present

## 2016-02-12 DIAGNOSIS — E119 Type 2 diabetes mellitus without complications: Secondary | ICD-10-CM | POA: Diagnosis not present

## 2016-02-12 DIAGNOSIS — I10 Essential (primary) hypertension: Secondary | ICD-10-CM | POA: Diagnosis not present

## 2016-02-12 DIAGNOSIS — R262 Difficulty in walking, not elsewhere classified: Secondary | ICD-10-CM | POA: Diagnosis not present

## 2016-02-17 DIAGNOSIS — R262 Difficulty in walking, not elsewhere classified: Secondary | ICD-10-CM | POA: Diagnosis not present

## 2016-02-17 DIAGNOSIS — E119 Type 2 diabetes mellitus without complications: Secondary | ICD-10-CM | POA: Diagnosis not present

## 2016-02-17 DIAGNOSIS — Z9181 History of falling: Secondary | ICD-10-CM | POA: Diagnosis not present

## 2016-02-17 DIAGNOSIS — I10 Essential (primary) hypertension: Secondary | ICD-10-CM | POA: Diagnosis not present

## 2016-02-17 DIAGNOSIS — M6281 Muscle weakness (generalized): Secondary | ICD-10-CM | POA: Diagnosis not present

## 2016-02-18 ENCOUNTER — Ambulatory Visit: Payer: Commercial Managed Care - HMO | Admitting: Family

## 2016-02-18 DIAGNOSIS — Z9181 History of falling: Secondary | ICD-10-CM | POA: Diagnosis not present

## 2016-02-18 DIAGNOSIS — R262 Difficulty in walking, not elsewhere classified: Secondary | ICD-10-CM | POA: Diagnosis not present

## 2016-02-18 DIAGNOSIS — E119 Type 2 diabetes mellitus without complications: Secondary | ICD-10-CM | POA: Diagnosis not present

## 2016-02-18 DIAGNOSIS — I10 Essential (primary) hypertension: Secondary | ICD-10-CM | POA: Diagnosis not present

## 2016-02-18 DIAGNOSIS — M6281 Muscle weakness (generalized): Secondary | ICD-10-CM | POA: Diagnosis not present

## 2016-02-24 DIAGNOSIS — R262 Difficulty in walking, not elsewhere classified: Secondary | ICD-10-CM | POA: Diagnosis not present

## 2016-02-24 DIAGNOSIS — I10 Essential (primary) hypertension: Secondary | ICD-10-CM | POA: Diagnosis not present

## 2016-02-24 DIAGNOSIS — Z9181 History of falling: Secondary | ICD-10-CM | POA: Diagnosis not present

## 2016-02-24 DIAGNOSIS — E119 Type 2 diabetes mellitus without complications: Secondary | ICD-10-CM | POA: Diagnosis not present

## 2016-02-24 DIAGNOSIS — M6281 Muscle weakness (generalized): Secondary | ICD-10-CM | POA: Diagnosis not present

## 2016-02-26 DIAGNOSIS — M6281 Muscle weakness (generalized): Secondary | ICD-10-CM | POA: Diagnosis not present

## 2016-02-26 DIAGNOSIS — R262 Difficulty in walking, not elsewhere classified: Secondary | ICD-10-CM | POA: Diagnosis not present

## 2016-02-26 DIAGNOSIS — E119 Type 2 diabetes mellitus without complications: Secondary | ICD-10-CM | POA: Diagnosis not present

## 2016-02-26 DIAGNOSIS — Z9181 History of falling: Secondary | ICD-10-CM | POA: Diagnosis not present

## 2016-02-26 DIAGNOSIS — I10 Essential (primary) hypertension: Secondary | ICD-10-CM | POA: Diagnosis not present

## 2016-03-02 DIAGNOSIS — Z8673 Personal history of transient ischemic attack (TIA), and cerebral infarction without residual deficits: Secondary | ICD-10-CM | POA: Insufficient documentation

## 2016-03-02 DIAGNOSIS — M6281 Muscle weakness (generalized): Secondary | ICD-10-CM | POA: Diagnosis not present

## 2016-03-02 DIAGNOSIS — Z9181 History of falling: Secondary | ICD-10-CM | POA: Diagnosis not present

## 2016-03-02 DIAGNOSIS — R262 Difficulty in walking, not elsewhere classified: Secondary | ICD-10-CM | POA: Diagnosis not present

## 2016-03-02 DIAGNOSIS — E785 Hyperlipidemia, unspecified: Secondary | ICD-10-CM | POA: Insufficient documentation

## 2016-03-02 DIAGNOSIS — I10 Essential (primary) hypertension: Secondary | ICD-10-CM | POA: Diagnosis not present

## 2016-03-02 DIAGNOSIS — E119 Type 2 diabetes mellitus without complications: Secondary | ICD-10-CM | POA: Diagnosis not present

## 2016-03-05 DIAGNOSIS — Z9181 History of falling: Secondary | ICD-10-CM | POA: Diagnosis not present

## 2016-03-05 DIAGNOSIS — E119 Type 2 diabetes mellitus without complications: Secondary | ICD-10-CM | POA: Diagnosis not present

## 2016-03-05 DIAGNOSIS — M6281 Muscle weakness (generalized): Secondary | ICD-10-CM | POA: Diagnosis not present

## 2016-03-05 DIAGNOSIS — I10 Essential (primary) hypertension: Secondary | ICD-10-CM | POA: Diagnosis not present

## 2016-03-05 DIAGNOSIS — R262 Difficulty in walking, not elsewhere classified: Secondary | ICD-10-CM | POA: Diagnosis not present

## 2016-03-08 DIAGNOSIS — Z9181 History of falling: Secondary | ICD-10-CM | POA: Diagnosis not present

## 2016-03-08 DIAGNOSIS — R262 Difficulty in walking, not elsewhere classified: Secondary | ICD-10-CM | POA: Diagnosis not present

## 2016-03-08 DIAGNOSIS — I10 Essential (primary) hypertension: Secondary | ICD-10-CM | POA: Diagnosis not present

## 2016-03-08 DIAGNOSIS — M6281 Muscle weakness (generalized): Secondary | ICD-10-CM | POA: Diagnosis not present

## 2016-03-08 DIAGNOSIS — E119 Type 2 diabetes mellitus without complications: Secondary | ICD-10-CM | POA: Diagnosis not present

## 2016-04-02 ENCOUNTER — Ambulatory Visit (INDEPENDENT_AMBULATORY_CARE_PROVIDER_SITE_OTHER): Payer: Commercial Managed Care - HMO | Admitting: Internal Medicine

## 2016-04-02 ENCOUNTER — Other Ambulatory Visit: Payer: Self-pay | Admitting: Internal Medicine

## 2016-04-02 ENCOUNTER — Encounter: Payer: Self-pay | Admitting: Internal Medicine

## 2016-04-02 VITALS — BP 132/72 | HR 77 | Temp 98.1°F | Ht 71.0 in | Wt 155.8 lb

## 2016-04-02 DIAGNOSIS — I2510 Atherosclerotic heart disease of native coronary artery without angina pectoris: Secondary | ICD-10-CM | POA: Diagnosis not present

## 2016-04-02 DIAGNOSIS — Z23 Encounter for immunization: Secondary | ICD-10-CM | POA: Diagnosis not present

## 2016-04-02 DIAGNOSIS — F329 Major depressive disorder, single episode, unspecified: Secondary | ICD-10-CM | POA: Diagnosis not present

## 2016-04-02 DIAGNOSIS — F32A Depression, unspecified: Secondary | ICD-10-CM

## 2016-04-02 DIAGNOSIS — R634 Abnormal weight loss: Secondary | ICD-10-CM | POA: Diagnosis not present

## 2016-04-02 DIAGNOSIS — E1151 Type 2 diabetes mellitus with diabetic peripheral angiopathy without gangrene: Secondary | ICD-10-CM

## 2016-04-02 DIAGNOSIS — H9193 Unspecified hearing loss, bilateral: Secondary | ICD-10-CM | POA: Diagnosis not present

## 2016-04-02 DIAGNOSIS — H409 Unspecified glaucoma: Secondary | ICD-10-CM

## 2016-04-02 DIAGNOSIS — E785 Hyperlipidemia, unspecified: Secondary | ICD-10-CM | POA: Diagnosis not present

## 2016-04-02 DIAGNOSIS — F0391 Unspecified dementia with behavioral disturbance: Secondary | ICD-10-CM | POA: Diagnosis not present

## 2016-04-02 DIAGNOSIS — Z8673 Personal history of transient ischemic attack (TIA), and cerebral infarction without residual deficits: Secondary | ICD-10-CM | POA: Diagnosis not present

## 2016-04-02 DIAGNOSIS — F03B18 Unspecified dementia, moderate, with other behavioral disturbance: Secondary | ICD-10-CM

## 2016-04-02 DIAGNOSIS — I10 Essential (primary) hypertension: Secondary | ICD-10-CM | POA: Diagnosis not present

## 2016-04-02 LAB — CBC WITH DIFFERENTIAL/PLATELET
BASOS PCT: 0 %
Basophils Absolute: 0 cells/uL (ref 0–200)
EOS ABS: 88 {cells}/uL (ref 15–500)
Eosinophils Relative: 2 %
HEMATOCRIT: 39 % (ref 38.5–50.0)
Hemoglobin: 13 g/dL — ABNORMAL LOW (ref 13.2–17.1)
LYMPHS PCT: 41 %
Lymphs Abs: 1804 cells/uL (ref 850–3900)
MCH: 30.7 pg (ref 27.0–33.0)
MCHC: 33.3 g/dL (ref 32.0–36.0)
MCV: 92.2 fL (ref 80.0–100.0)
MONO ABS: 440 {cells}/uL (ref 200–950)
MPV: 10.4 fL (ref 7.5–12.5)
Monocytes Relative: 10 %
NEUTROS PCT: 47 %
Neutro Abs: 2068 cells/uL (ref 1500–7800)
Platelets: 170 10*3/uL (ref 140–400)
RBC: 4.23 MIL/uL (ref 4.20–5.80)
RDW: 12.6 % (ref 11.0–15.0)
WBC: 4.4 10*3/uL (ref 3.8–10.8)

## 2016-04-02 LAB — LIPID PANEL
CHOL/HDL RATIO: 4.1 ratio (ref <=5.0)
CHOLESTEROL: 205 mg/dL — AB (ref 125–200)
HDL: 50 mg/dL (ref >=40)
LDL Cholesterol: 127 mg/dL (ref <130)
TRIGLYCERIDES: 139 mg/dL (ref <150)
VLDL: 28 mg/dL (ref <30)

## 2016-04-02 LAB — COMPLETE METABOLIC PANEL WITH GFR
ALBUMIN: 4.3 g/dL (ref 3.6–5.1)
ALK PHOS: 53 U/L (ref 40–115)
ALT: 24 U/L (ref 9–46)
AST: 32 U/L (ref 10–35)
BUN: 18 mg/dL (ref 7–25)
CALCIUM: 10.1 mg/dL (ref 8.6–10.3)
CO2: 31 mmol/L (ref 20–31)
Chloride: 92 mmol/L — ABNORMAL LOW (ref 98–110)
Creat: 1.4 mg/dL — ABNORMAL HIGH (ref 0.70–1.11)
GFR, EST AFRICAN AMERICAN: 50 mL/min — AB (ref >=60)
GFR, EST NON AFRICAN AMERICAN: 44 mL/min — AB (ref >=60)
Glucose, Bld: 421 mg/dL — ABNORMAL HIGH (ref 65–99)
POTASSIUM: 4.6 mmol/L (ref 3.5–5.3)
Sodium: 133 mmol/L — ABNORMAL LOW (ref 135–146)
Total Bilirubin: 0.7 mg/dL (ref 0.2–1.2)
Total Protein: 6.6 g/dL (ref 6.1–8.1)

## 2016-04-02 LAB — URINALYSIS, ROUTINE W REFLEX MICROSCOPIC
Bilirubin Urine: NEGATIVE
HGB URINE DIPSTICK: NEGATIVE
Ketones, ur: NEGATIVE
Leukocytes, UA: NEGATIVE
NITRITE: NEGATIVE
PH: 7 (ref 5.0–8.0)
PROTEIN: NEGATIVE
Specific Gravity, Urine: 1.027 (ref 1.001–1.035)

## 2016-04-02 MED ORDER — MEMANTINE HCL-DONEPEZIL HCL 7 & 14 & 21 &28 -10 MG PO C4PK: 1.0000 | EXTENDED_RELEASE_CAPSULE | Freq: Every day | ORAL | 0 refills | Status: DC

## 2016-04-02 MED ORDER — ZOSTER VACCINE LIVE 19400 UNT/0.65ML ~~LOC~~ SUSR: 0.6500 mL | Freq: Once | SUBCUTANEOUS | 0 refills | Status: AC

## 2016-04-02 NOTE — Progress Notes (Signed)
Patient ID: [name], male   DOB: 1922/05/14, 82 y.o.   MRN: [id number]    Location:  [facility] Place of Service: OFFICE    Advanced Directive information Does patient have an advance directive?: Yes, Type of Advance Directive: Living will;Healthcare Power of Attorney  Chief Complaint  Patient presents with  . Establish Care    new patient to establish care  . Other    Flu vacc requested    HPI:  82 yo male seen today as a new pt. He has no concerns and reports feeling well overall. daughter is c/a hearing loss. Pt is a poor historian due to dementia. Hx obtained from chart  DM - A1c 7.9% in Apr 2017. He takes metformin. BS at home not checked on a regular basis. No low BS reactions  HTN/CAD - s/p DES in 2014. BP stable on amlodipine and HCTZ. Takes ASA daily. Followed by cardio Dr Meda Coffee. LDL at goal. 2D echo in Feb 2017 showed EF 65-70% and no wall motion abnormalities but does have grade 1 DD;mild-mod AI; mild MR  Dementia with behavioral disturbance - on no meds for cognition. He takes lexapro for depression. Albumin 4.4. Weight down 18 lbs from 173 lbs in May 2017. MMSE 15/30. Followed by neurology  Hyperlipidemia - stable on simvastatin. LDL 92; HDL 36  Depression - mood stable on lexapro  Glaucoma - followed by eye specialist at [facility]. Currently not on meds  Arthritis - joint pain stable  CKD - Cr 1.23  Hx CVA - stable on ASA daily  Seasonal allergy - stable on claritin  Past Medical History:  Diagnosis Date  . Arthritis   . CAD (coronary artery disease)    s/p DES to LAD 06/2013  . Depression   . Dyslipidemia   . Glaucoma   . Hyperglycemia 04/17/2014  . Hyperlipidemia   . Hypertension   . Stroke ([place])   . Type 2 diabetes mellitus ([place])     Past Surgical History:  Procedure Laterality Date  . CARDIAC CATHETERIZATION  06/2013   DES to LAD by Dr Tamala Julian  . Right Cataract      Patient Care Team: Golden Circle, FNP as PCP - General  (Family Medicine) Hayden Pedro, MD (Ophthalmology)  Social History   Social History  . Marital status: Widowed    Spouse name: N/A  . Number of children: 4  . Years of education: N/A   Occupational History  . Retired    Social History Main Topics  . Smoking status: Former Smoker    Types: Cigarettes    Quit date: 08/02/1950  . Smokeless tobacco: Never Used  . Alcohol use No  . Drug use: No  . Sexual activity: Not on file   Other Topics Concern  . Not on file   Social History Narrative   Retired Warden/ranger   widowed, lives with dtr and youngest son   5 kids from first marriage     reports that he quit smoking about 65 years ago. His smoking use included Cigarettes. He has never used smokeless tobacco. He reports that he does not drink alcohol or use drugs.  Family History  Problem Relation Age of Onset  . Arthritis Mother   . Breast cancer Mother   . Arthritis Father   . Dementia Father   . Diabetes Other   . Arthritis Other   . Colon cancer Other   . Stroke Other   . Diabetes type  II Sister   . Suicidality Brother   . Diabetes type I Brother   . Diabetes type I Brother   . Diabetes type I Brother   . Diabetes type II Sister   . Breast cancer Daughter   . Emphysema Daughter    Family Status  Relation Status  . Mother Deceased  . Father Deceased  . Other   . Sister Alive  . Brother Deceased  . Brother Alive  . Brother Alive  . Brother Deceased  . Sister Alive  . Daughter Deceased  . Daughter Alive  . Son Alive  . Son Alive  . Daughter Deceased    Immunization History  Administered Date(s) Administered  . Influenza, High Dose Seasonal PF 06/22/2013, 05/30/2015  . Influenza,inj,Quad PF,36+ Mos 04/19/2014  . Pneumococcal Conjugate-13 07/25/2014  . Pneumococcal Polysaccharide-23 08/03/2011, 06/22/2013  . Td 10/22/2013  . Tdap 07/30/2015    No Known Allergies  Medications: Patient's Medications  New Prescriptions   No medications on  file  Previous Medications   AMLODIPINE (NORVASC) 5 MG TABLET    TAKE ONE TABLET BY MOUTH ONCE DAILY   ASPIRIN EC 81 MG TABLET    Take 1 tablet (81 mg total) by mouth daily.   ESCITALOPRAM (LEXAPRO) 5 MG TABLET    Take 1 tablet daily   HYDROCHLOROTHIAZIDE (HYDRODIURIL) 25 MG TABLET    TAKE 1 TABLET EVERY DAY   LORATADINE 10 MG CAPS    Take 1 capsule by mouth daily.   METFORMIN (GLUCOPHAGE) 500 MG TABLET    TAKE 1 TABLET TWICE DAILY WITH MEALS   MULTIPLE VITAMINS-MINERALS (SENTRY SENIOR) TABS    Take 1 tablet by mouth daily.   SIMVASTATIN (ZOCOR) 20 MG TABLET    TAKE 1 TABLET EVERY DAY  Modified Medications   No medications on file  Discontinued Medications   No medications on file    Review of Systems  Unable to perform ROS: Dementia    Vitals:   04/02/16 0837  BP: 132/72  Pulse: 77  Temp: 98.1 F (36.7 C)  TempSrc: Oral  SpO2: 95%  Weight: 155 lb 12.8 oz (70.7 kg)  Height: '5\' 11"'  (1.803 m)   Body mass index is 21.73 kg/m.  Physical Exam  Constitutional: He appears well-developed and well-nourished.  HENT:  Mouth/Throat: Oropharynx is clear and moist.  Eyes: Pupils are equal, round, and reactive to light. No scleral icterus.  Neck: Neck supple. Carotid bruit is not present. No thyromegaly present.  Cardiovascular: Normal rate, regular rhythm, normal heart sounds and intact distal pulses.  Exam reveals no gallop and no friction rub.   No murmur heard. no distal LE swelling. No calf TTP  Pulmonary/Chest: Effort normal and breath sounds normal. He has no wheezes. He has no rales. He exhibits no tenderness.  Abdominal: Soft. Bowel sounds are normal. He exhibits no distension, no abdominal bruit, no pulsatile midline mass and no mass. There is no hepatomegaly. There is no tenderness. There is no rebound and no guarding.  Musculoskeletal: He exhibits edema.  Lymphadenopathy:    He has no cervical adenopathy.  Neurological: He is alert. He has normal reflexes.  Skin: Skin is  warm and dry. No rash noted.  Psychiatric: He has a normal mood and affect. His behavior is normal.   Diabetic Foot Exam - Simple   Simple Foot Form Diabetic Foot exam was performed with the following findings:  Yes 04/02/2016  9:23 AM  Visual Inspection See comments:  Yes Sensation Testing  Intact to touch and monofilament testing bilaterally:  Yes Pulse Check Posterior Tibialis and Dorsalis pulse intact bilaterally:  Yes Comments Toenail dystrophy with thick discolored nails. (+) hammertoes/bunions       Labs reviewed: No visits with results within 3 Month(s) from this visit.  Latest known visit with results is:  Appointment on 11/24/2015  Component Date Value Ref Range Status  . WBC 11/24/2015 6.4  4.0 - 10.5 K/uL Final  . RBC 11/24/2015 4.26  4.22 - 5.81 Mil/uL Final  . Platelets 11/24/2015 199.0  150.0 - 400.0 K/uL Final  . Hemoglobin 11/24/2015 13.2  13.0 - 17.0 g/dL Final  . HCT 11/24/2015 39.3  39.0 - 52.0 % Final  . MCV 11/24/2015 92.2  78.0 - 100.0 fl Final  . MCHC 11/24/2015 33.7  30.0 - 36.0 g/dL Final  . RDW 11/24/2015 13.0  11.5 - 15.5 % Final  . Sodium 11/24/2015 140  135 - 145 mEq/L Final  . Potassium 11/24/2015 4.4  3.5 - 5.1 mEq/L Final  . Chloride 11/24/2015 96  96 - 112 mEq/L Final  . CO2 11/24/2015 36* 19 - 32 mEq/L Final  . Glucose, Bld 11/24/2015 230* 70 - 99 mg/dL Final  . BUN 11/24/2015 13  6 - 23 mg/dL Final  . Creatinine, Ser 11/24/2015 1.23  0.40 - 1.50 mg/dL Final  . Total Bilirubin 11/24/2015 0.5  0.2 - 1.2 mg/dL Final  . Alkaline Phosphatase 11/24/2015 43  39 - 117 U/L Final  . AST 11/24/2015 25  0 - 37 U/L Final  . ALT 11/24/2015 21  0 - 53 U/L Final  . Total Protein 11/24/2015 7.0  6.0 - 8.3 g/dL Final  . Albumin 11/24/2015 4.4  3.5 - 5.2 g/dL Final  . Calcium 11/24/2015 10.3  8.4 - 10.5 mg/dL Final  . GFR 11/24/2015 70.83  >60.00 mL/min Final  . Cholesterol 11/24/2015 188  0 - 200 mg/dL Final  . Triglycerides 11/24/2015 283.0* 0.0 - 149.0  mg/dL Final  . HDL 11/24/2015 36.20* >39.00 mg/dL Final  . VLDL 11/24/2015 56.6* 0.0 - 40.0 mg/dL Final  . Total CHOL/HDL Ratio 11/24/2015 5   Final  . NonHDL 11/24/2015 152.03   Final  . PSA 11/24/2015 6.24* 0.10 - 4.00 ng/mL Final  . Hgb A1c MFr Bld 11/24/2015 7.9* 4.6 - 6.5 % Final  . Direct LDL 11/24/2015 92.0  mg/dL Final    No results found.   Assessment/Plan   ICD-9-CM ICD-10-CM   1. Moderate dementia with behavioral disturbance 294.21 F03.91 CMP with eGFR     Prealbumin     Memantine HCl-Donepezil HCl (NAMZARIC) 7 & 14 & 21 &28 -10 MG C4PK  2. Type 2 diabetes, controlled, with peripheral circulatory disorder (HCC) 250.70 E11.51 CMP with eGFR   443.81  CBC with Differential/Platelets     Microalbumin/Creatinine Ratio, Urine     Urinalysis with Reflex Microscopic  3. Loss of weight 783.21 R63.4 CMP with eGFR     CBC with Differential/Platelets     Prealbumin     Urinalysis with Reflex Microscopic     TSH  4. Hearing loss, bilateral 389.9 H91.93 Ambulatory referral to Audiology  5. Essential hypertension 401.9 I10   6. Coronary artery disease involving native coronary artery of native heart without angina pectoris 414.01 I25.10   7. Hyperlipidemia 272.4 E78.5 Lipid Panel     TSH  8. History of stroke V12.54 Z86.73   9. Glaucoma 365.9 H40.9   10. Depression 311 F32.9  Due to pt's age, he has age out of screening colonoscopy and PSA. He is UTD on vaccinations. His last medicare wellness exam was in April of 2017  Start namzaric - sample stater pak x 1  and maintenance dose x 2 provided  Continue all other medications as ordered  Will call with lab results  Will call with referral appt  Follow up with specialists as scheduled  Flu shot given today  Follow up in 1 month for dementia  Karelyn Brisby S. Perlie Gold  [facility] [address] [place], [place] [address] [phone] Cell (Monday-Friday 8 AM - 5  PM) [phone] After 5 PM and follow prompts

## 2016-04-02 NOTE — Patient Instructions (Addendum)
Start namzaric - sample stater pak provided  Continue all other medications as ordered  Will call with lab results  Will call with referral appt  Follow up with specialists as scheduled  Flu shot given today  Follow up in 1 month for dementia

## 2016-04-03 LAB — TSH: TSH: 2.12 mIU/L (ref 0.40–4.50)

## 2016-04-03 LAB — URINALYSIS, MICROSCOPIC ONLY
Bacteria, UA: NONE SEEN [HPF]
Casts: NONE SEEN [LPF]
Crystals: NONE SEEN [HPF]
Squamous Epithelial / LPF: NONE SEEN [HPF] (ref <=5)
WBC UA: NONE SEEN WBC/HPF (ref <=5)
Yeast: NONE SEEN [HPF]

## 2016-04-03 LAB — MICROALBUMIN / CREATININE URINE RATIO
Creatinine, Urine: 75 mg/dL (ref 20–370)
MICROALB/CREAT RATIO: 33 ug/mg{creat} — AB (ref <30)
Microalb, Ur: 2.5 mg/dL

## 2016-04-05 LAB — PREALBUMIN: Prealbumin: 23 mg/dL (ref 21–43)

## 2016-04-06 ENCOUNTER — Telehealth: Payer: Self-pay | Admitting: *Deleted

## 2016-04-06 MED ORDER — LISINOPRIL 2.5 MG PO TABS: 2.5000 mg | ORAL_TABLET | Freq: Every day | ORAL | 3 refills | Status: DC

## 2016-04-06 NOTE — Addendum Note (Signed)
Addended by: Denyse Amass on: 04/06/2016 09:38 AM   Modules accepted: Orders

## 2016-04-06 NOTE — Telephone Encounter (Signed)
Patient asking for any other BP meds besides lisinopril, this made him cough in the past please advise.

## 2016-04-07 LAB — HEMOGLOBIN A1C

## 2016-04-08 DIAGNOSIS — H5210 Myopia, unspecified eye: Secondary | ICD-10-CM | POA: Diagnosis not present

## 2016-04-08 DIAGNOSIS — E119 Type 2 diabetes mellitus without complications: Secondary | ICD-10-CM | POA: Diagnosis not present

## 2016-04-08 MED ORDER — LOSARTAN POTASSIUM 25 MG PO TABS: 25.0000 mg | ORAL_TABLET | Freq: Every day | ORAL | 3 refills | Status: DC

## 2016-04-08 MED ORDER — INSULIN GLARGINE 300 UNIT/ML ~~LOC~~ SOPN: 14.0000 [IU] | PEN_INJECTOR | Freq: Every day | SUBCUTANEOUS | 1 refills | Status: DC

## 2016-04-08 NOTE — Telephone Encounter (Signed)
Left message on machine that rx's where called in and that we can show her how to give insulin if she schedules a nurse visit. Asked daughter to return my call.

## 2016-04-08 NOTE — Telephone Encounter (Signed)
Per last lab result note, A1c very elevated. He needs to stop metformin and start toujeo. He needs diabetic education with [facility]. He needs nurse room visit to start insulin TODAY.  Please add lisinopril to allergy list (re cough). D/c lisinopril. Rx cozaar 25mg  #30 take 1 po daily for kidney protection with 3 RF

## 2016-04-08 NOTE — Telephone Encounter (Signed)
Patient's daughter is calling to inquire about message from 04-06-16. Still awaiting response.   Patient also with elevated fasting blood sugar readings today was 500 and yesterday  379. Patient is taking Metformin as prescribed.  Please advise

## 2016-04-08 NOTE — Telephone Encounter (Signed)
Spoke with daughter who was very upset with the changes, per daughter she needs to check the price of the Toujeo first and Cozaar to see if they can afford them first. Per daughter she can't come in for nurse visit, but she also states she doesn't know how to give insulin? Per daughter she will check with insurance or the pharmacy to see the prices then she will call us back to send in the rx's. Per daughter she never picked up the lisinopril.   I will remove from med list and update.

## 2016-04-08 NOTE — Telephone Encounter (Signed)
Noted. She can have samples of Toujeo but really needs to come in for insulin teaching. If not, will need [facility] nursing to go to home. Cozaar is generic and very affordable.

## 2016-04-08 NOTE — Telephone Encounter (Signed)
Per result note, he will need 14 units subcut qhs

## 2016-04-08 NOTE — Telephone Encounter (Signed)
We have 3 pens of Toujeo, please advise how many units pt should start with?

## 2016-04-09 ENCOUNTER — Encounter: Payer: Self-pay | Admitting: Internal Medicine

## 2016-04-09 ENCOUNTER — Ambulatory Visit (INDEPENDENT_AMBULATORY_CARE_PROVIDER_SITE_OTHER): Payer: Commercial Managed Care - HMO | Admitting: Internal Medicine

## 2016-04-09 VITALS — BP 128/72 | HR 79 | Temp 97.8°F | Ht 71.0 in | Wt 153.6 lb

## 2016-04-09 DIAGNOSIS — Z794 Long term (current) use of insulin: Secondary | ICD-10-CM | POA: Diagnosis not present

## 2016-04-09 DIAGNOSIS — E1151 Type 2 diabetes mellitus with diabetic peripheral angiopathy without gangrene: Secondary | ICD-10-CM

## 2016-04-09 NOTE — Progress Notes (Signed)
Patient ID: [name], male   DOB: Mar 11, 1922, 82 y.o.   MRN: [id number]   Patient came in with daughter for diabetes teaching for insulin. Daughter understood and didn't have any questions. I gave her a handout of injection sites also.

## 2016-04-16 ENCOUNTER — Other Ambulatory Visit: Payer: Self-pay | Admitting: *Deleted

## 2016-04-16 MED ORDER — AMLODIPINE BESYLATE 5 MG PO TABS: 5.0000 mg | ORAL_TABLET | Freq: Every day | ORAL | 3 refills | Status: DC

## 2016-04-16 MED ORDER — ACCU-CHEK SOFTCLIX LANCET DEV MISC: 3 refills | Status: DC

## 2016-04-16 MED ORDER — BD SWAB SINGLE USE REGULAR PADS: MEDICATED_PAD | 3 refills | Status: DC

## 2016-04-16 MED ORDER — ACCU-CHEK AVIVA PLUS W/DEVICE KIT: PACK | 0 refills | Status: DC

## 2016-04-16 MED ORDER — GLUCOSE BLOOD VI STRP: ORAL_STRIP | 3 refills | Status: DC

## 2016-04-16 MED ORDER — ACCU-CHEK AVIVA VI SOLN: 3 refills | Status: DC

## 2016-04-16 MED ORDER — INSULIN GLARGINE 300 UNIT/ML ~~LOC~~ SOPN: 14.0000 [IU] | PEN_INJECTOR | Freq: Every day | SUBCUTANEOUS | 3 refills | Status: DC

## 2016-04-16 MED ORDER — INSULIN PEN NEEDLE 31G X 5 MM MISC: 3 refills | Status: DC

## 2016-04-16 NOTE — Telephone Encounter (Signed)
[facility].  

## 2016-04-19 ENCOUNTER — Telehealth: Payer: Self-pay | Admitting: *Deleted

## 2016-04-19 NOTE — Telephone Encounter (Signed)
LMOM to return call.

## 2016-04-19 NOTE — Telephone Encounter (Signed)
Patient called and stated that ever since patient started the Toujeo he has had a cough. Please Advise.

## 2016-04-19 NOTE — Telephone Encounter (Signed)
Cough unlikely due to Toujeo. He does have seasonal allergy and it is ragweed season, continue claritin daily. Start OTC flonase 1 spray each nostril daily. Follow up as scheduled. Call office if he is not feeling any better

## 2016-04-20 ENCOUNTER — Encounter: Payer: Self-pay | Admitting: Internal Medicine

## 2016-04-20 ENCOUNTER — Other Ambulatory Visit: Payer: Self-pay | Admitting: *Deleted

## 2016-04-20 MED ORDER — GLUCOSE BLOOD VI STRP: ORAL_STRIP | 3 refills | Status: DC

## 2016-04-20 MED ORDER — ACCU-CHEK SOFTCLIX LANCET DEV MISC: 3 refills | Status: DC

## 2016-04-20 MED ORDER — BD SWAB SINGLE USE REGULAR PADS: MEDICATED_PAD | 3 refills | Status: DC

## 2016-04-20 MED ORDER — ACCU-CHEK AVIVA VI SOLN: 3 refills | Status: DC

## 2016-04-20 MED ORDER — AMLODIPINE BESYLATE 5 MG PO TABS: 5.0000 mg | ORAL_TABLET | Freq: Every day | ORAL | 3 refills | Status: DC

## 2016-04-20 MED ORDER — ACCU-CHEK AVIVA PLUS W/DEVICE KIT: PACK | 0 refills | Status: DC

## 2016-04-20 MED ORDER — INSULIN GLARGINE 300 UNIT/ML ~~LOC~~ SOPN: 14.0000 [IU] | PEN_INJECTOR | Freq: Every day | SUBCUTANEOUS | 3 refills | Status: DC

## 2016-04-20 MED ORDER — INSULIN PEN NEEDLE 31G X 5 MM MISC: 3 refills | Status: DC

## 2016-04-20 NOTE — Telephone Encounter (Signed)
[facility] 

## 2016-04-21 MED ORDER — FLUTICASONE PROPIONATE 50 MCG/ACT NA SUSP: 1.0000 | Freq: Every day | NASAL | 6 refills | Status: DC

## 2016-04-21 NOTE — Telephone Encounter (Signed)
[facility] notified and agreed.

## 2016-05-06 ENCOUNTER — Other Ambulatory Visit: Payer: Self-pay

## 2016-05-06 MED ORDER — MEMANTINE HCL-DONEPEZIL HCL ER 28-10 MG PO CP24: 1.0000 | ORAL_CAPSULE | Freq: Every day | ORAL | 1 refills | Status: DC

## 2016-05-12 ENCOUNTER — Ambulatory Visit: Payer: Commercial Managed Care - HMO | Admitting: Internal Medicine

## 2016-05-19 ENCOUNTER — Ambulatory Visit (INDEPENDENT_AMBULATORY_CARE_PROVIDER_SITE_OTHER): Payer: Commercial Managed Care - HMO | Admitting: Internal Medicine

## 2016-05-19 ENCOUNTER — Encounter: Payer: Self-pay | Admitting: Internal Medicine

## 2016-05-19 VITALS — BP 158/78 | HR 68 | Temp 98.2°F | Ht 71.0 in | Wt 173.5 lb

## 2016-05-19 DIAGNOSIS — Z794 Long term (current) use of insulin: Secondary | ICD-10-CM

## 2016-05-19 DIAGNOSIS — N183 Chronic kidney disease, stage 3 (moderate): Secondary | ICD-10-CM

## 2016-05-19 DIAGNOSIS — I10 Essential (primary) hypertension: Secondary | ICD-10-CM | POA: Diagnosis not present

## 2016-05-19 DIAGNOSIS — E1122 Type 2 diabetes mellitus with diabetic chronic kidney disease: Secondary | ICD-10-CM

## 2016-05-19 DIAGNOSIS — F03B18 Unspecified dementia, moderate, with other behavioral disturbance: Secondary | ICD-10-CM

## 2016-05-19 DIAGNOSIS — F0391 Unspecified dementia with behavioral disturbance: Secondary | ICD-10-CM

## 2016-05-19 DIAGNOSIS — R634 Abnormal weight loss: Secondary | ICD-10-CM | POA: Diagnosis not present

## 2016-05-19 MED ORDER — ZOSTER VACCINE LIVE 19400 UNT/0.65ML ~~LOC~~ SUSR: 0.6500 mL | Freq: Once | SUBCUTANEOUS | 0 refills | Status: AC

## 2016-05-19 NOTE — Patient Instructions (Signed)
Increase insulin to 18 units at bedtime  Continue other medications as ordered  Follow up with Dr Meda Coffee as scheduled  Continue checking blood sugar at least daily at home  Start checking blood pressure at least daily at home. Bring record to next visit   Follow up in 2 mos for routine visit. Fasting labs prior to appt

## 2016-05-19 NOTE — Progress Notes (Signed)
Patient ID: [name], male   DOB: [birth year], 82 y.o.   MRN: [id number]    Location:  [facility] Place of Service: OFFICE  Chief Complaint  Patient presents with  . Medical Management of Chronic Issues    1 month routine visit    HPI:  82 yo male seen today for f/u dementia. He was started on Namzaric at his last OV. Daughter reports appetite improved and pt is now getting up early as he was before. She noticed less depression. He gained 20lbs since last visit. No N/V. No nightmares. No diarrhea. He is a poor historian due to dementia. Hx obtained from chart  DM - A1c >14%. He takes metformin and Toujeo 14 units. BS at home 170-230s. No low BS reactions. No numbness/tingling in hands/feet. Urine microalbumin/cr ratio 33. He gets a cough with lisinopril. Currently taking losartan  HTN/CAD - s/p DES in 2014. BP elevated on amlodipine and HCTZ. Takes ASA daily. Followed by cardio Dr Meda Coffee. LDL at goal. 2D echo in Feb 2017 showed EF 65-70% and no wall motion abnormalities but does have grade 1 DD;mild-mod AI; mild MR  Dementia with behavioral disturbance - improved on namzaric. He takes lexapro for depression. Albumin 4.4. Weight up 20 lbs. MMSE 15/30. Followed by neurology  Hyperlipidemia - stable on simvastatin. LDL 127; HDL 50  Depression - mood stable on lexapro  Glaucoma - followed by eye specialist at [facility]. Currently not on meds  Arthritis - joint pain stable  CKD - stage 3. Cr 1.40  Hx CVA - stable on ASA daily  Seasonal allergy - stable on claritin    Past Medical History:  Diagnosis Date  . Arthritis   . CAD (coronary artery disease)    s/p DES to LAD 06/2013  . Depression   . Dyslipidemia   . Glaucoma   . Hyperglycemia 04/17/2014  . Hyperlipidemia   . Hypertension   . Stroke ([place])   . Type 2 diabetes mellitus ([place])     Past Surgical History:  Procedure Laterality Date  . CARDIAC CATHETERIZATION  06/2013   DES to LAD by Dr Tamala Julian  . Right  Cataract      Patient Care Team: Gildardo Cranker, DO as PCP - General (Internal Medicine) Hayden Pedro, MD (Ophthalmology)  Social History   Social History  . Marital status: Widowed    Spouse name: N/A  . Number of children: 4  . Years of education: N/A   Occupational History  . Retired    Social History Main Topics  . Smoking status: Former Smoker    Types: Cigarettes    Quit date: 08/02/1950  . Smokeless tobacco: Never Used  . Alcohol use No  . Drug use: No  . Sexual activity: Not on file   Other Topics Concern  . Not on file   Social History Narrative   Retired Warden/ranger   widowed, lives with dtr and youngest son   5 kids from first marriage     reports that he quit smoking about 65 years ago. His smoking use included Cigarettes. He has never used smokeless tobacco. He reports that he does not drink alcohol or use drugs.  Family History  Problem Relation Age of Onset  . Arthritis Mother   . Breast cancer Mother   . Arthritis Father   . Dementia Father   . Diabetes Other   . Arthritis Other   . Colon cancer Other   . Stroke Other   .  Diabetes type II Sister   . Suicidality Brother   . Diabetes type I Brother   . Diabetes type I Brother   . Diabetes type I Brother   . Diabetes type II Sister   . Breast cancer Daughter   . Emphysema Daughter    Family Status  Relation Status  . Mother Deceased  . Father Deceased  . Other   . Sister Alive  . Brother Deceased  . Brother Alive  . Brother Alive  . Brother Deceased  . Sister Alive  . Daughter Deceased  . Daughter Alive  . Son Alive  . Son Alive  . Daughter Deceased     Allergies  Allergen Reactions  . Lisinopril Cough    Medications: Patient's Medications  New Prescriptions   No medications on file  Previous Medications   ALCOHOL SWABS (B-D SINGLE USE SWABS REGULAR) PADS    Use as directed with checking blood sugar three times daily. Dx: E11.51   AMLODIPINE (NORVASC) 5 MG TABLET     Take 1 tablet (5 mg total) by mouth daily.   ASPIRIN EC 81 MG TABLET    Take 1 tablet (81 mg total) by mouth daily.   BLOOD GLUCOSE CALIBRATION (ACCU-CHEK AVIVA) SOLN    Use as Directed. Dx: E11.51   BLOOD GLUCOSE MONITORING SUPPL (ACCU-CHEK AVIVA PLUS) W/DEVICE KIT    Use as directed with checking blood sugar three times daily. Dx: E11.51   ESCITALOPRAM (LEXAPRO) 5 MG TABLET    Take 1 tablet daily   FLUTICASONE (FLONASE) 50 MCG/ACT NASAL SPRAY    Place 1 spray into both nostrils daily.   GLUCOSE BLOOD (ACCU-CHEK AVIVA) TEST STRIP    Use to test blood sugar three times daily. Dx: E11.51   HYDROCHLOROTHIAZIDE (HYDRODIURIL) 25 MG TABLET    TAKE 1 TABLET EVERY DAY   INSULIN GLARGINE (TOUJEO SOLOSTAR) 300 UNIT/ML SOPN    Inject 14 Units into the skin at bedtime.   INSULIN PEN NEEDLE 31G X 5 MM MISC    Use as Directed. Dx: E11.51   LANCET DEVICES (ACCU-CHEK SOFTCLIX) LANCETS    Use as instructed with checking blood sugar three times daily. Dx: E11.51   LORATADINE 10 MG CAPS    Take 1 capsule by mouth daily.   LOSARTAN (COZAAR) 25 MG TABLET    Take 1 tablet (25 mg total) by mouth daily.   MEMANTINE HCL-DONEPEZIL HCL (NAMZARIC) 28-10 MG CP24    Take 1 tablet by mouth daily.   MULTIPLE VITAMINS-MINERALS (SENTRY SENIOR) TABS    Take 1 tablet by mouth daily.   SIMVASTATIN (ZOCOR) 20 MG TABLET    TAKE 1 TABLET EVERY DAY  Modified Medications   Modified Medication Previous Medication   ZOSTER VACCINE LIVE, PF, (ZOSTAVAX) 95320 UNT/0.65ML INJECTION Zoster Vaccine Live, PF, (ZOSTAVAX) 23343 UNT/0.65ML injection      Inject 19,400 Units into the skin once.    Inject 0.65 mLs into the skin once.  Discontinued Medications   No medications on file    Review of Systems  Unable to perform ROS: Dementia    Vitals:   05/19/16 0813  BP: (!) 158/78 repeated BP 160/64  Pulse: 68  Temp: 98.2 F (36.8 C)  TempSrc: Oral  SpO2: 96%  Weight: 173 lb 8 oz (78.7 kg)  Height: 5' 11" (1.803 m)   Body mass index  is 24.2 kg/m.  Physical Exam  Constitutional: He appears well-developed and well-nourished.  Cardiovascular: Normal rate, regular rhythm and intact  distal pulses.   Occasional extrasystoles are present. Exam reveals no gallop and no friction rub.   Murmur (1/6 sem) heard. +1 pitting LE edema b/l. No calf TTP  Pulmonary/Chest: Breath sounds normal. No accessory muscle usage. No respiratory distress. He has no decreased breath sounds. He has no wheezes. He has no rhonchi. He has no rales.  Musculoskeletal: He exhibits edema.  Neurological: He is alert.  Skin: Skin is warm and dry. No rash noted.  Psychiatric: He has a normal mood and affect. His behavior is normal.     Labs reviewed: Orders Only on 04/02/2016  Component Date Value Ref Range Status  . Hgb A1c MFr Bld 04/07/2016 >14.0* <5.7 % Final   Comment: Verified by repeat analysis.   For someone without known diabetes, a hemoglobin A1c value of 6.5% or greater indicates that they may have diabetes and this should be confirmed with a follow-up test.   For someone with known diabetes, a value <7% indicates that their diabetes is well controlled and a value greater than or equal to 7% indicates suboptimal control. A1c targets should be individualized based on duration of diabetes, age, comorbid conditions, and other considerations.   Currently, no consensus exists for use of hemoglobin A1c for diagnosis of diabetes for children.     . Mean Plasma Glucose 04/07/2016 SEE NOTE  mg/dL Final   Comment: eAG cannot be calculated. Hemoglobin A1c result exceeds the linearity of the assay.     Office Visit on 04/02/2016  Component Date Value Ref Range Status  . Sodium 04/02/2016 133* 135 - 146 mmol/L Final  . Potassium 04/02/2016 4.6  3.5 - 5.3 mmol/L Final  . Chloride 04/02/2016 92* 98 - 110 mmol/L Final  . CO2 04/02/2016 31  20 - 31 mmol/L Final  . Glucose, Bld 04/02/2016 421* 65 - 99 mg/dL Final  . BUN 04/02/2016 18  7 - 25  mg/dL Final  . Creat 04/02/2016 1.40* 0.70 - 1.11 mg/dL Final   Comment:   For patients > or = 80 years of age: The upper reference limit for Creatinine is approximately 13% higher for people identified as African-American.     . Total Bilirubin 04/02/2016 0.7  0.2 - 1.2 mg/dL Final  . Alkaline Phosphatase 04/02/2016 53  40 - 115 U/L Final  . AST 04/02/2016 32  10 - 35 U/L Final  . ALT 04/02/2016 24  9 - 46 U/L Final  . Total Protein 04/02/2016 6.6  6.1 - 8.1 g/dL Final  . Albumin 04/02/2016 4.3  3.6 - 5.1 g/dL Final  . Calcium 04/02/2016 10.1  8.6 - 10.3 mg/dL Final  . GFR, Est African American 04/02/2016 50* >=60 mL/min Final  . GFR, Est Non African American 04/02/2016 44* >=60 mL/min Final  . Cholesterol 04/02/2016 205* 125 - 200 mg/dL Final  . Triglycerides 04/02/2016 139  <150 mg/dL Final  . HDL 04/02/2016 50  >=40 mg/dL Final  . Total CHOL/HDL Ratio 04/02/2016 4.1  <=5.0 Ratio Final  . VLDL 04/02/2016 28  <30 mg/dL Final  . LDL Cholesterol 04/02/2016 127  <130 mg/dL Final   Comment:   Total Cholesterol/HDL Ratio:CHD Risk                        Coronary Heart Disease Risk Table  Men       Women          1/2 Average Risk              3.4        3.3              Average Risk              5.0        4.4           2X Average Risk              9.6        7.1           3X Average Risk             23.4       11.0 Use the calculated Patient Ratio above and the CHD Risk table  to determine the patient's CHD Risk.   . WBC 04/02/2016 4.4  3.8 - 10.8 K/uL Final  . RBC 04/02/2016 4.23  4.20 - 5.80 MIL/uL Final  . Hemoglobin 04/02/2016 13.0* 13.2 - 17.1 g/dL Final  . HCT 04/02/2016 39.0  38.5 - 50.0 % Final  . MCV 04/02/2016 92.2  80.0 - 100.0 fL Final  . MCH 04/02/2016 30.7  27.0 - 33.0 pg Final  . MCHC 04/02/2016 33.3  32.0 - 36.0 g/dL Final  . RDW 04/02/2016 12.6  11.0 - 15.0 % Final  . Platelets 04/02/2016 170  140 - 400 K/uL Final  . MPV  04/02/2016 10.4  7.5 - 12.5 fL Final  . Neutro Abs 04/02/2016 2068  1,500 - 7,800 cells/uL Final  . Lymphs Abs 04/02/2016 1804  850 - 3,900 cells/uL Final  . Monocytes Absolute 04/02/2016 440  200 - 950 cells/uL Final  . Eosinophils Absolute 04/02/2016 88  15 - 500 cells/uL Final  . Basophils Absolute 04/02/2016 0  0 - 200 cells/uL Final  . Neutrophils Relative % 04/02/2016 47  % Final  . Lymphocytes Relative 04/02/2016 41  % Final  . Monocytes Relative 04/02/2016 10  % Final  . Eosinophils Relative 04/02/2016 2  % Final  . Basophils Relative 04/02/2016 0  % Final  . Smear Review 04/02/2016 Criteria for review not met   Final  . Creatinine, Urine 04/03/2016 75  20 - 370 mg/dL Final  . Microalb, Ur 04/03/2016 2.5  Not estab mg/dL Final  . Microalb Creat Ratio 04/03/2016 33* <30 mcg/mg creat Final   Comment: The ADA has defined abnormalities in albumin excretion as follows:           Category           Result                            (mcg/mg creatinine)                 Normal:    <30       Microalbuminuria:    30 - 299   Clinical albuminuria:    > or = 300   The ADA recommends that at least two of three specimens collected within a 3 - 6 month period be abnormal before considering a patient to be within a diagnostic category.     . Prealbumin 04/05/2016 23  21 - 43 mg/dL Final   Comment: ** Please note change in reference range(s). **     .  Color, Urine 04/02/2016 YELLOW  YELLOW Final  . APPearance 04/02/2016 CLEAR  CLEAR Final  . Specific Gravity, Urine 04/02/2016 1.027  1.001 - 1.035 Final  . pH 04/02/2016 7.0  5.0 - 8.0 Final  . Glucose, UA 04/02/2016 3+* NEGATIVE Final  . Bilirubin Urine 04/02/2016 NEGATIVE  NEGATIVE Final  . Ketones, ur 04/02/2016 NEGATIVE  NEGATIVE Final  . Hgb urine dipstick 04/02/2016 NEGATIVE  NEGATIVE Final  . Protein, ur 04/02/2016 NEGATIVE  NEGATIVE Final  . Nitrite 04/02/2016 NEGATIVE  NEGATIVE Final  . Leukocytes, UA 04/02/2016 NEGATIVE   NEGATIVE Final  . TSH 04/03/2016 2.12  0.40 - 4.50 mIU/L Final  . WBC, UA 04/03/2016 NONE SEEN  <=5 WBC/HPF Final  . RBC / HPF 04/03/2016 0-2  <=2 RBC/HPF Final  . Squamous Epithelial / LPF 04/03/2016 NONE SEEN  <=5 HPF Final  . Bacteria, UA 04/03/2016 NONE SEEN  NONE SEEN HPF Final  . Crystals 04/03/2016 NONE SEEN  NONE SEEN HPF Final  . Casts 04/03/2016 NONE SEEN  NONE SEEN LPF Final  . Yeast 04/03/2016 NONE SEEN  NONE SEEN HPF Final    No results found.   Assessment/Plan   ICD-9-CM ICD-10-CM   1. Moderate dementia with behavioral disturbance - improved 294.21 F03.91 CMP with eGFR  2. Type 2 diabetes mellitus with stage 3 chronic kidney disease, with long-term current use of insulin (HCC) - improving 250.40 E11.22 CMP with eGFR   585.3 N18.3 Hemoglobin A1C   V58.67 Z79.4 Lipid Panel  3. Essential hypertension - BP elevated today 401.9 I10   4. Loss of weight - resolved 783.21 R63.4      T/c adjusting BP med if remains elevated at home  Increase insulin to 18 units at bedtime  Continue other medications as ordered  Follow up with Dr Meda Coffee as scheduled  Continue checking blood sugar at least daily at home  Start checking blood pressure at least daily at home. Bring record to next visit   Follow up in 2 mos for routine visit. Fasting labs prior to appt  [place]ert[place] S. Perlie Gold  [facility] [address] [place], [place] [address] [phone] Cell (Monday-Friday 8 AM - 5 PM) [phone] After 5 PM and follow prompts

## 2016-05-25 DIAGNOSIS — H903 Sensorineural hearing loss, bilateral: Secondary | ICD-10-CM | POA: Diagnosis not present

## 2016-05-25 DIAGNOSIS — E1169 Type 2 diabetes mellitus with other specified complication: Secondary | ICD-10-CM | POA: Diagnosis not present

## 2016-07-19 DIAGNOSIS — H903 Sensorineural hearing loss, bilateral: Secondary | ICD-10-CM | POA: Diagnosis not present

## 2016-07-21 ENCOUNTER — Other Ambulatory Visit: Payer: Commercial Managed Care - HMO

## 2016-07-21 DIAGNOSIS — E1122 Type 2 diabetes mellitus with diabetic chronic kidney disease: Secondary | ICD-10-CM

## 2016-07-21 DIAGNOSIS — N183 Chronic kidney disease, stage 3 (moderate): Secondary | ICD-10-CM

## 2016-07-21 DIAGNOSIS — F0391 Unspecified dementia with behavioral disturbance: Secondary | ICD-10-CM | POA: Diagnosis not present

## 2016-07-21 DIAGNOSIS — Z794 Long term (current) use of insulin: Secondary | ICD-10-CM

## 2016-07-21 DIAGNOSIS — F03B18 Unspecified dementia, moderate, with other behavioral disturbance: Secondary | ICD-10-CM

## 2016-07-21 LAB — LIPID PANEL
CHOL/HDL RATIO: 3.8 ratio (ref <5.0)
Cholesterol: 154 mg/dL (ref <200)
HDL: 41 mg/dL (ref >40)
LDL CALC: 92 mg/dL (ref <100)
TRIGLYCERIDES: 106 mg/dL (ref <150)
VLDL: 21 mg/dL (ref <30)

## 2016-07-21 LAB — COMPLETE METABOLIC PANEL WITH GFR
ALBUMIN: 4.1 g/dL (ref 3.6–5.1)
ALK PHOS: 45 U/L (ref 40–115)
ALT: 43 U/L (ref 9–46)
AST: 52 U/L — AB (ref 10–35)
BILIRUBIN TOTAL: 0.4 mg/dL (ref 0.2–1.2)
BUN: 18 mg/dL (ref 7–25)
CALCIUM: 9.6 mg/dL (ref 8.6–10.3)
CO2: 28 mmol/L (ref 20–31)
Chloride: 102 mmol/L (ref 98–110)
Creat: 1.14 mg/dL — ABNORMAL HIGH (ref 0.70–1.11)
GFR, Est African American: 64 mL/min (ref >=60)
GFR, Est Non African American: 56 mL/min — ABNORMAL LOW (ref >=60)
GLUCOSE: 153 mg/dL — AB (ref 65–99)
Potassium: 4 mmol/L (ref 3.5–5.3)
SODIUM: 139 mmol/L (ref 135–146)
TOTAL PROTEIN: 6.5 g/dL (ref 6.1–8.1)

## 2016-07-22 LAB — HEMOGLOBIN A1C
HEMOGLOBIN A1C: 7.6 % — AB (ref <5.7)
Mean Plasma Glucose: 171 mg/dL

## 2016-07-23 ENCOUNTER — Encounter: Payer: Self-pay | Admitting: Internal Medicine

## 2016-07-23 ENCOUNTER — Ambulatory Visit (INDEPENDENT_AMBULATORY_CARE_PROVIDER_SITE_OTHER): Payer: Commercial Managed Care - HMO | Admitting: Internal Medicine

## 2016-07-23 VITALS — BP 144/72 | HR 76 | Temp 97.6°F | Ht 71.0 in | Wt 179.4 lb

## 2016-07-23 DIAGNOSIS — F0391 Unspecified dementia with behavioral disturbance: Secondary | ICD-10-CM | POA: Diagnosis not present

## 2016-07-23 DIAGNOSIS — E782 Mixed hyperlipidemia: Secondary | ICD-10-CM | POA: Diagnosis not present

## 2016-07-23 DIAGNOSIS — N183 Chronic kidney disease, stage 3 (moderate): Secondary | ICD-10-CM

## 2016-07-23 DIAGNOSIS — Z794 Long term (current) use of insulin: Secondary | ICD-10-CM

## 2016-07-23 DIAGNOSIS — E1122 Type 2 diabetes mellitus with diabetic chronic kidney disease: Secondary | ICD-10-CM | POA: Diagnosis not present

## 2016-07-23 DIAGNOSIS — Z8673 Personal history of transient ischemic attack (TIA), and cerebral infarction without residual deficits: Secondary | ICD-10-CM

## 2016-07-23 DIAGNOSIS — F03B18 Unspecified dementia, moderate, with other behavioral disturbance: Secondary | ICD-10-CM

## 2016-07-23 DIAGNOSIS — I10 Essential (primary) hypertension: Secondary | ICD-10-CM | POA: Diagnosis not present

## 2016-07-23 NOTE — Patient Instructions (Addendum)
Continue current medications as ordered  Continue checking blood sugar at least daily  Follow up in 4 mos for CPE/AWV. Fasting labs prior to appt

## 2016-07-23 NOTE — Progress Notes (Signed)
Patient ID: [name], male   DOB: May 14, 1922, 82 y.o.   MRN: [id number]    Location:  [facility] Place of Service: OFFICE  Chief Complaint  Patient presents with  . Medical Management of Chronic Issues    2 month routine visit    HPI:  82 yo male seen today for f/u. He reports feeling well. Appetite is excellent. Gait unsteady and he uses cane to ambulate. Sleeps well. He has gained 5 lbs since last ov. He is a poor historian due to dementia. Hx obtained from chart and daughter  DM - A1c 7.6% (down from >14%). He takes Toujeo 18 units. Off metformin due to worsening renal fxn. BS at home <200. No low BS reactions. No numbness/tingling in hands/feet. Urine microalbumin/cr ratio 33. He gets a cough with lisinopril. Currently taking losartan  HTN/CAD - s/p DES in 2014. BP stable on losartan, amlodipine and HCTZ. Takes ASA daily. Followed by cardio Dr Meda Coffee. LDL 92. 2D echo in Feb 2017 showed EF 65-70% and no wall motion abnormalities but does have grade 1 DD;mild-mod AI; mild MR  Dementia with behavioral disturbance - improved on namzaric. He takes lexapro for depression. Albumin 4.4. Weight up 20 lbs. MMSE 15/30. Followed by neurology  Hyperlipidemia - stable on simvastatin. LDL 92; HDL 41  Depression - mood stable on lexapro  Glaucoma - followed by eye specialist at [facility]. Currently not on meds  Arthritis - joint pain stable  CKD - stage 3. Cr 1.14  Hx CVA - stable on ASA daily  Seasonal allergy - stable on claritin   Past Medical History:  Diagnosis Date  . Arthritis   . CAD (coronary artery disease)    s/p DES to LAD 06/2013  . Depression   . Dyslipidemia   . Glaucoma   . Hyperglycemia 04/17/2014  . Hyperlipidemia   . Hypertension   . Stroke ([place])   . Type 2 diabetes mellitus ([place])     Past Surgical History:  Procedure Laterality Date  . CARDIAC CATHETERIZATION  06/2013   DES to LAD by Dr Tamala Julian  . Right Cataract      Patient Care Team: Gildardo Cranker, DO as PCP - General (Internal Medicine) Hayden Pedro, MD (Ophthalmology)  Social History   Social History  . Marital status: Widowed    Spouse name: N/A  . Number of children: 4  . Years of education: N/A   Occupational History  . Retired    Social History Main Topics  . Smoking status: Former Smoker    Types: Cigarettes    Quit date: 08/02/1950  . Smokeless tobacco: Never Used  . Alcohol use No  . Drug use: No  . Sexual activity: Not on file   Other Topics Concern  . 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 His smoking use included Cigarettes. He has never used smokeless tobacco. He reports that he does not drink alcohol or use drugs.  Family History  Problem Relation Age of Onset  . Arthritis Mother   . Breast cancer Mother   . Arthritis Father   . Dementia Father   . Diabetes Other   . Arthritis Other   . Colon cancer Other   . Stroke Other   . Diabetes type II Sister   .  Suicidality Brother   . Diabetes type I Brother   . Diabetes type I Brother   . Diabetes type I Brother   . Diabetes type II Sister   . Breast cancer Daughter   . Emphysema Daughter    Family Status  Relation Status  . Mother Deceased  . Father Deceased  . Other   . Sister Alive  . Brother Deceased  . Brother Alive  . Brother Alive  . Brother Deceased  . Sister Alive  . Daughter Deceased  . Daughter Alive  . Son Alive  . Son Alive  . Daughter Deceased     Allergies  Allergen Reactions  . Lisinopril Cough    Medications: Patient's Medications  New Prescriptions   No medications on file  Previous Medications   ALCOHOL SWABS (B-D SINGLE USE SWABS REGULAR) PADS    Use as directed with checking blood sugar three times daily. Dx: E11.51   AMLODIPINE (NORVASC) 5 MG TABLET    Take 1 tablet (5 mg total) by mouth daily.    ASPIRIN EC 81 MG TABLET    Take 1 tablet (81 mg total) by mouth daily.   BLOOD GLUCOSE CALIBRATION (ACCU-CHEK AVIVA) SOLN    Use as Directed. Dx: E11.51   BLOOD GLUCOSE MONITORING SUPPL (ACCU-CHEK AVIVA PLUS) W/DEVICE KIT    Use as directed with checking blood sugar three times daily. Dx: E11.51   ESCITALOPRAM (LEXAPRO) 5 MG TABLET    Take 1 tablet daily   FLUTICASONE (FLONASE) 50 MCG/ACT NASAL SPRAY    Place 1 spray into both nostrils daily.   GLUCOSE BLOOD (ACCU-CHEK AVIVA) TEST STRIP    Use to test blood sugar three times daily. Dx: E11.51   HYDROCHLOROTHIAZIDE (HYDRODIURIL) 25 MG TABLET    TAKE 1 TABLET EVERY DAY   INSULIN GLARGINE (TOUJEO SOLOSTAR) 300 UNIT/ML SOPN    Inject 18 Units into the skin at bedtime.   INSULIN PEN NEEDLE 31G X 5 MM MISC    Use as Directed. Dx: E11.51   LANCET DEVICES (ACCU-CHEK SOFTCLIX) LANCETS    Use as instructed with checking blood sugar three times daily. Dx: E11.51   LORATADINE 10 MG CAPS    Take 1 capsule by mouth daily.   LOSARTAN (COZAAR) 25 MG TABLET    Take 1 tablet (25 mg total) by mouth daily.   MEMANTINE HCL-DONEPEZIL HCL (NAMZARIC) 28-10 MG CP24    Take 1 tablet by mouth daily.   MULTIPLE VITAMINS-MINERALS (SENTRY SENIOR) TABS    Take 1 tablet by mouth daily.   SIMVASTATIN (ZOCOR) 20 MG TABLET    TAKE 1 TABLET EVERY DAY  Modified Medications   No medications on file  Discontinued Medications   No medications on file    Review of Systems  Unable to perform ROS: Dementia    Vitals:   07/23/16 0826  BP: (!) 144/72  Pulse: 76  Temp: 97.6 F (36.4 C)  TempSrc: Oral  SpO2: 95%  Weight: 179 lb 6.4 oz (81.4 kg)  Height: '5\' 11"'  (1.803 m)   Body mass index is 25.02 kg/m.  Physical Exam  Constitutional: He appears well-developed and well-nourished.  HENT:  Mouth/Throat: Oropharynx is clear and moist.  Eyes: Pupils are equal, round, and reactive to light. No scleral icterus.  Neck: Neck supple. Carotid bruit is not present. No  thyromegaly present.  Cardiovascular: Normal rate, regular rhythm, normal heart sounds and intact distal pulses.  Exam reveals no gallop and no friction rub.  No murmur heard. no distal LE swelling. No calf TTP  Pulmonary/Chest: Effort normal and breath sounds normal. He has no wheezes. He has no rales. He exhibits no tenderness.  Abdominal: Soft. Bowel sounds are normal. He exhibits no distension, no abdominal bruit, no pulsatile midline mass and no mass. There is no hepatomegaly. There is no tenderness. There is no rebound and no guarding.  Musculoskeletal: He exhibits edema.  Lymphadenopathy:    He has no cervical adenopathy.  Neurological: He is alert. He has normal reflexes.  Skin: Skin is warm and dry. No rash noted.  Psychiatric: He has a normal mood and affect. His behavior is normal.     Labs reviewed: Appointment on 07/21/2016  Component Date Value Ref Range Status  . Sodium 07/21/2016 139  135 - 146 mmol/L Final  . Potassium 07/21/2016 4.0  3.5 - 5.3 mmol/L Final  . Chloride 07/21/2016 102  98 - 110 mmol/L Final  . CO2 07/21/2016 28  20 - 31 mmol/L Final  . Glucose, Bld 07/21/2016 153* 65 - 99 mg/dL Final  . BUN 07/21/2016 18  7 - 25 mg/dL Final  . Creat 07/21/2016 1.14* 0.70 - 1.11 mg/dL Final   Comment:   For patients > or = 80 years of age: The upper reference limit for Creatinine is approximately 13% higher for people identified as African-American.     . Total Bilirubin 07/21/2016 0.4  0.2 - 1.2 mg/dL Final  . Alkaline Phosphatase 07/21/2016 45  40 - 115 U/L Final  . AST 07/21/2016 52* 10 - 35 U/L Final  . ALT 07/21/2016 43  9 - 46 U/L Final  . Total Protein 07/21/2016 6.5  6.1 - 8.1 g/dL Final  . Albumin 07/21/2016 4.1  3.6 - 5.1 g/dL Final  . Calcium 07/21/2016 9.6  8.6 - 10.3 mg/dL Final  . GFR, Est African American 07/21/2016 64  >=60 mL/min Final  . GFR, Est Non African American 07/21/2016 56* >=60 mL/min Final  . Hgb A1c MFr Bld 07/22/2016 7.6* <5.7 %  Final   Comment:   For someone without known diabetes, a hemoglobin A1c value of 6.5% or greater indicates that they may have diabetes and this should be confirmed with a follow-up test.   For someone with known diabetes, a value <7% indicates that their diabetes is well controlled and a value greater than or equal to 7% indicates suboptimal control. A1c targets should be individualized based on duration of diabetes, age, comorbid conditions, and other considerations.   Currently, no consensus exists for use of hemoglobin A1c for diagnosis of diabetes for children.     . Mean Plasma Glucose 07/22/2016 171  mg/dL Final  . Cholesterol 07/21/2016 154  <200 mg/dL Final  . Triglycerides 07/21/2016 106  <150 mg/dL Final  . HDL 07/21/2016 41  >40 mg/dL Final  . Total CHOL/HDL Ratio 07/21/2016 3.8  <5.0 Ratio Final  . VLDL 07/21/2016 21  <30 mg/dL Final  . LDL Cholesterol 07/21/2016 92  <100 mg/dL Final    No results found.   Assessment/Plan   ICD-9-CM ICD-10-CM   1. Type 2 diabetes mellitus with stage 3 chronic kidney disease, with long-term current use of insulin (HCC) 250.40 E11.22 CMP with eGFR   585.3 N18.3 Hemoglobin A1c   V58.67 Z79.4   2. Moderate dementia with behavioral disturbance 294.21 F03.91   3. Essential hypertension 401.9 I10   4. Mixed hyperlipidemia 272.2 E78.2 Lipid Panel  5. History of stroke V12.54 Z86.73  Continue current medications as ordered  Continue checking blood sugar at least daily  Follow up in 4 mos for CPE/AWV. Fasting labs prior to appt   [place] S. Perlie Gold  [facility] [address] [place], [place] [address] [phone] Cell (Monday-Friday 8 AM - 5 PM) [phone] After 5 PM and follow prompts

## 2016-08-23 ENCOUNTER — Other Ambulatory Visit: Payer: Self-pay | Admitting: *Deleted

## 2016-08-23 MED ORDER — LOSARTAN POTASSIUM 25 MG PO TABS: 25.0000 mg | ORAL_TABLET | Freq: Every day | ORAL | 3 refills | Status: DC

## 2016-08-23 NOTE — Telephone Encounter (Signed)
[facility] 

## 2016-08-25 ENCOUNTER — Other Ambulatory Visit: Payer: Self-pay | Admitting: *Deleted

## 2016-08-25 MED ORDER — LOSARTAN POTASSIUM 25 MG PO TABS: 25.0000 mg | ORAL_TABLET | Freq: Every day | ORAL | 3 refills | Status: DC

## 2016-08-25 NOTE — Telephone Encounter (Signed)
[facility] 

## 2016-09-02 ENCOUNTER — Ambulatory Visit (INDEPENDENT_AMBULATORY_CARE_PROVIDER_SITE_OTHER): Payer: Medicare HMO | Admitting: Nurse Practitioner

## 2016-09-02 ENCOUNTER — Encounter: Payer: Self-pay | Admitting: Nurse Practitioner

## 2016-09-02 VITALS — BP 138/78 | HR 80 | Temp 97.9°F | Resp 18 | Ht 71.0 in | Wt 182.8 lb

## 2016-09-02 DIAGNOSIS — R05 Cough: Secondary | ICD-10-CM

## 2016-09-02 DIAGNOSIS — R059 Cough, unspecified: Secondary | ICD-10-CM

## 2016-09-02 NOTE — Progress Notes (Signed)
Careteam: Patient Care Team: Gildardo Cranker, DO as PCP - General (Internal Medicine) Hayden Pedro, MD (Ophthalmology)  Advanced Directive information Does Patient Have a Medical Advance Directive?: Yes, Type of Advance Directive: Healthcare Power of Attorney  Allergies  Allergen Reactions  . Lisinopril Cough    Chief Complaint  Patient presents with  . Acute Visit    Cough/some SOB x 1 week.     HPI: Patient is a 83 y.o. male seen in the office today with cough and congestion for 1 week. Overall feels good.  No fever or chills.  Daughter here with pt helping with ROS and HPI due to pts dementia. She reports some wheezing and increase shortness of breath with activity.  Chest congestion with dry cough.  Giving tussin DM twice daily- drinking water throughout the day.  Good appetite.    Review of Systems: very limited due to dementia.  Review of Systems  Constitutional: Negative for activity change, chills, fatigue, fever and unexpected weight change.  HENT: Positive for congestion. Negative for sinus pressure and sore throat.   Respiratory: Positive for cough, shortness of breath and wheezing.   Neurological: Negative for headaches.    Past Medical History:  Diagnosis Date  . Arthritis   . CAD (coronary artery disease)    s/p DES to LAD 06/2013  . Depression   . Dyslipidemia   . Glaucoma   . Hyperglycemia 04/17/2014  . Hyperlipidemia   . Hypertension   . Stroke ([place])   . Type 2 diabetes mellitus ([place])    Past Surgical History:  Procedure Laterality Date  . CARDIAC CATHETERIZATION  06/2013   DES to LAD by Dr Tamala Julian  . Right Cataract     Social History:   reports that he quit smoking about 66 years ago. His smoking use included Cigarettes. He has never used smokeless tobacco. He reports that he does not drink alcohol or use drugs.  Family History  Problem Relation Age of Onset  . Arthritis Mother   . Breast cancer Mother   . Arthritis Father   .  Dementia Father   . Diabetes Other   . Arthritis Other   . Colon cancer Other   . Stroke Other   . Diabetes type II Sister   . Suicidality Brother   . Diabetes type I Brother   . Diabetes type I Brother   . Diabetes type I Brother   . Diabetes type II Sister   . Breast cancer Daughter   . Emphysema Daughter     Medications: Patient's Medications  New Prescriptions   No medications on file  Previous Medications   ALCOHOL SWABS (B-D SINGLE USE SWABS REGULAR) PADS    Use as directed with checking blood sugar three times daily. Dx: E11.51   AMLODIPINE (NORVASC) 5 MG TABLET    Take 1 tablet (5 mg total) by mouth daily.   ASPIRIN EC 81 MG TABLET    Take 1 tablet (81 mg total) by mouth daily.   BLOOD GLUCOSE CALIBRATION (ACCU-CHEK AVIVA) SOLN    Use as Directed. Dx: E11.51   BLOOD GLUCOSE MONITORING SUPPL (ACCU-CHEK AVIVA PLUS) W/DEVICE KIT    Use as directed with checking blood sugar three times daily. Dx: E11.51   ESCITALOPRAM (LEXAPRO) 5 MG TABLET    Take 1 tablet daily   FLUTICASONE (FLONASE) 50 MCG/ACT NASAL SPRAY    Place 1 spray into both nostrils daily.   GLUCOSE BLOOD (ACCU-CHEK AVIVA) TEST STRIP  Use to test blood sugar three times daily. Dx: E11.51   HYDROCHLOROTHIAZIDE (HYDRODIURIL) 25 MG TABLET    TAKE 1 TABLET EVERY DAY   INSULIN GLARGINE (TOUJEO SOLOSTAR) 300 UNIT/ML SOPN    Inject 18 Units into the skin at bedtime.   INSULIN PEN NEEDLE 31G X 5 MM MISC    Use as Directed. Dx: E11.51   LANCET DEVICES (ACCU-CHEK SOFTCLIX) LANCETS    Use as instructed with checking blood sugar three times daily. Dx: E11.51   LORATADINE 10 MG CAPS    Take 1 capsule by mouth daily.   LOSARTAN (COZAAR) 25 MG TABLET    Take 1 tablet (25 mg total) by mouth daily.   MEMANTINE HCL-DONEPEZIL HCL (NAMZARIC) 28-10 MG CP24    Take 1 tablet by mouth daily.   MULTIPLE VITAMINS-MINERALS (SENTRY SENIOR) TABS    Take 1 tablet by mouth daily.   SIMVASTATIN (ZOCOR) 20 MG TABLET    TAKE 1 TABLET EVERY DAY    Modified Medications   No medications on file  Discontinued Medications   ACCU-CHEK SOFTCLIX LANCETS LANCETS    Use as directed with checking blood glucose 3 times daily.     Physical Exam:  Vitals:   09/02/16 0835  BP: 138/78  Pulse: 80  Resp: 18  Temp: 97.9 F (36.6 C)  TempSrc: Oral  SpO2: 96%  Weight: 182 lb 12.8 oz (82.9 kg)  Height: 5' 11" (1.803 m)   Body mass index is 25.5 kg/m.  Physical Exam  Constitutional: He appears well-developed and well-nourished.  HENT:  Head: Normocephalic and atraumatic.  Right Ear: External ear normal.  Left Ear: External ear normal.  Nose: Nose normal.  Mouth/Throat: Oropharynx is clear and moist.  Eyes: Conjunctivae and EOM are normal. Pupils are equal, round, and reactive to light.  Neck: Normal range of motion. Neck supple. Carotid bruit is not present. No thyromegaly present.  Cardiovascular: Normal rate, regular rhythm and normal heart sounds.  Exam reveals no gallop and no friction rub.   No murmur heard. Pulmonary/Chest: Effort normal and breath sounds normal. No respiratory distress. He has no wheezes. He has no rales. He exhibits no tenderness.  Abdominal: He exhibits no abdominal bruit and no pulsatile midline mass. There is no hepatomegaly.  Lymphadenopathy:    He has no cervical adenopathy.  Neurological: He is alert.  Skin: Skin is warm and dry.  Psychiatric: He has a normal mood and affect. His behavior is normal.    Labs reviewed: Basic Metabolic Panel:  Recent Labs  11/24/15 0723 04/02/16 0957 07/21/16 0840  NA 140 133* 139  K 4.4 4.6 4.0  CL 96 92* 102  CO2 36* 31 28  GLUCOSE 230* 421* 153*  BUN _0 CREATININE 1.23 1.40* 1.14*  CALCIUM 10.3 10.1 9.6  TSH  --  2.12  --    Liver Function Tests:  Recent Labs  11/24/15 0723 04/02/16 0957 07/21/16 0840  AST 25 32 52*  ALT 21 24 43  ALKPHOS 43 53 45  BILITOT 0.5 0.7 0.4  PROT 7.0 6.6 6.5  ALBUMIN 4.4 4.3 4.1   No results for input(s):  LIPASE, AMYLASE in the last 8760 hours. No results for input(s): AMMONIA in the last 8760 hours. CBC:  Recent Labs  11/24/15 0723 04/02/16 0957  WBC 6.4 4.4  NEUTROABS  --  2,068  HGB 13.2 13.0*  HCT 39.3 39.0  MCV 92.2 92.2  PLT 199.0 170   Lipid Panel:  Recent Labs  11/24/15 0723 04/02/16 0957 07/21/16 0840  CHOL 188 205* 154  HDL 36.20* 50 41  LDLCALC  --  127 92  TRIG 283.0* 139 106  CHOLHDL 5 4.1 3.8  LDLDIRECT 92.0  --   --    TSH:  Recent Labs  04/02/16 0957  TSH 2.12   A1C: Lab Results  Component Value Date   HGBA1C 7.6 (H) 07/21/2016     Assessment/Plan 1. Cough Most likely due to viral illness. Supportive care at this time.  Cont tussin DM or may use Mucinex DM by mouth twice daily with full glass of water Cont good hydration and nutrition. Call if fever, worsening shortness of breath , fatigue or congestion occurs   Camrynn Mcclintic K. Harle Battiest  [facility] [phone] 8 am - 5 pm) [phone] (after hours)

## 2016-09-02 NOTE — Patient Instructions (Signed)
May use MUCINEX DM 1 tablet twice daily with full glass of water  or cont tussin DM   Notify if symptoms worsen or fail to improve Cough can last up to 6 weeks after viral illness Keep hydrated  Cough, Adult Coughing is a reflex that clears your throat and your airways. Coughing helps to heal and protect your lungs. It is normal to cough occasionally, but a cough that happens with other symptoms or lasts a long time may be a sign of a condition that needs treatment. A cough may last only 2-3 weeks (acute), or it may last longer than 8 weeks (chronic). What are the causes? Coughing is commonly caused by:  Breathing in substances that irritate your lungs.  A viral or bacterial respiratory infection.  Allergies.  Asthma.  Postnasal drip.  Smoking.  Acid backing up from the stomach into the esophagus (gastroesophageal reflux).  Certain medicines.  Chronic lung problems, including COPD (or rarely, lung cancer).  Other medical conditions such as heart failure. Follow these instructions at home: Pay attention to any changes in your symptoms. Take these actions to help with your discomfort:  Take medicines only as told by your health care provider.  If you were prescribed an antibiotic medicine, take it as told by your health care provider. Do not stop taking the antibiotic even if you start to feel better.  Talk with your health care provider before you take a cough suppressant medicine.  Drink enough fluid to keep your urine clear or pale yellow.  If the air is dry, use a cold steam vaporizer or humidifier in your bedroom or your home to help loosen secretions.  Avoid anything that causes you to cough at work or at home.  If your cough is worse at night, try sleeping in a semi-upright position.  Avoid cigarette smoke. If you smoke, quit smoking. If you need help quitting, ask your health care provider.  Avoid caffeine.  Avoid alcohol.  Rest as needed. Contact a health  care provider if:  You have new symptoms.  You cough up pus.  Your cough does not get better after 2-3 weeks, or your cough gets worse.  You cannot control your cough with suppressant medicines and you are losing sleep.  You develop pain that is getting worse or pain that is not controlled with pain medicines.  You have a fever.  You have unexplained weight loss.  You have night sweats. Get help right away if:  You cough up blood.  You have difficulty breathing.  Your heartbeat is very fast. This information is not intended to replace advice given to you by your health care provider. Make sure you discuss any questions you have with your health care provider. Document Released: 01/15/2011 Document Revised: 12/25/2015 Document Reviewed: 09/25/2014 Elsevier Interactive Patient Education  2017 [employer].

## 2016-09-16 ENCOUNTER — Encounter: Payer: Self-pay | Admitting: Internal Medicine

## 2016-09-22 ENCOUNTER — Ambulatory Visit (INDEPENDENT_AMBULATORY_CARE_PROVIDER_SITE_OTHER): Payer: Medicare HMO | Admitting: Neurology

## 2016-09-22 ENCOUNTER — Encounter: Payer: Self-pay | Admitting: Neurology

## 2016-09-22 VITALS — BP 146/78 | HR 91 | Ht 71.0 in | Wt 180.6 lb

## 2016-09-22 DIAGNOSIS — F0391 Unspecified dementia with behavioral disturbance: Secondary | ICD-10-CM

## 2016-09-22 DIAGNOSIS — F03B18 Unspecified dementia, moderate, with other behavioral disturbance: Secondary | ICD-10-CM
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
## 2016-09-22 NOTE — Patient Instructions (Signed)
1, Continue Namzaric 2. Continue Lexapro 3. Look into Home Health 4. Follow-up in 1 year, call for any changes

## 2016-10-12 ENCOUNTER — Other Ambulatory Visit: Payer: Self-pay | Admitting: Internal Medicine

## 2016-11-18 ENCOUNTER — Other Ambulatory Visit: Payer: Self-pay | Admitting: Internal Medicine

## 2016-11-24 ENCOUNTER — Encounter: Payer: Commercial Managed Care - HMO | Admitting: Internal Medicine

## 2016-11-29 ENCOUNTER — Other Ambulatory Visit: Payer: Self-pay

## 2016-11-29 MED ORDER — SIMVASTATIN 20 MG PO TABS: 20.0000 mg | ORAL_TABLET | Freq: Every day | ORAL | 1 refills | Status: DC

## 2016-12-02 ENCOUNTER — Telehealth: Payer: Self-pay | Admitting: Internal Medicine

## 2016-12-02 NOTE — Telephone Encounter (Signed)
I left a message explaining that the nurse will be out of office tomorrow, so we will not do AWV. I explained that pt should still come in for labs and we would get AWV rescheduled. VDM (DD)

## 2016-12-03 ENCOUNTER — Ambulatory Visit: Payer: Medicare HMO

## 2016-12-03 ENCOUNTER — Other Ambulatory Visit: Payer: Self-pay | Admitting: *Deleted

## 2016-12-03 ENCOUNTER — Other Ambulatory Visit: Payer: Medicare HMO

## 2016-12-03 MED ORDER — SIMVASTATIN 20 MG PO TABS: 20.0000 mg | ORAL_TABLET | Freq: Every day | ORAL | 1 refills | Status: DC

## 2016-12-03 NOTE — Telephone Encounter (Signed)
[facility] 

## 2016-12-08 ENCOUNTER — Encounter: Payer: Commercial Managed Care - HMO | Admitting: Internal Medicine

## 2016-12-10 ENCOUNTER — Ambulatory Visit (INDEPENDENT_AMBULATORY_CARE_PROVIDER_SITE_OTHER): Payer: Medicare HMO

## 2016-12-10 ENCOUNTER — Other Ambulatory Visit: Payer: Medicare HMO

## 2016-12-10 VITALS — BP 130/66 | HR 80 | Temp 98.0°F | Ht 71.0 in | Wt 173.0 lb

## 2016-12-10 DIAGNOSIS — Z0000 Encounter for general adult medical examination without abnormal findings: Secondary | ICD-10-CM | POA: Diagnosis not present

## 2016-12-10 DIAGNOSIS — Y9301 Activity, walking, marching and hiking: Secondary | ICD-10-CM

## 2016-12-10 DIAGNOSIS — E782 Mixed hyperlipidemia: Secondary | ICD-10-CM

## 2016-12-10 DIAGNOSIS — N183 Chronic kidney disease, stage 3 unspecified: Secondary | ICD-10-CM

## 2016-12-10 DIAGNOSIS — Z794 Long term (current) use of insulin: Secondary | ICD-10-CM | POA: Diagnosis not present

## 2016-12-10 DIAGNOSIS — E1122 Type 2 diabetes mellitus with diabetic chronic kidney disease: Secondary | ICD-10-CM | POA: Diagnosis not present

## 2016-12-10 LAB — COMPLETE METABOLIC PANEL WITH GFR
ALT: 38 U/L (ref 9–46)
AST: 38 U/L — ABNORMAL HIGH (ref 10–35)
Albumin: 4.2 g/dL (ref 3.6–5.1)
Alkaline Phosphatase: 60 U/L (ref 40–115)
BILIRUBIN TOTAL: 0.4 mg/dL (ref 0.2–1.2)
BUN: 17 mg/dL (ref 7–25)
CO2: 32 mmol/L — AB (ref 20–31)
Calcium: 9.6 mg/dL (ref 8.6–10.3)
Chloride: 93 mmol/L — ABNORMAL LOW (ref 98–110)
Creat: 1.48 mg/dL — ABNORMAL HIGH (ref 0.70–1.11)
GFR, EST NON AFRICAN AMERICAN: 40 mL/min — AB (ref >=60)
GFR, Est African American: 47 mL/min — ABNORMAL LOW (ref >=60)
GLUCOSE: 379 mg/dL — AB (ref 65–99)
POTASSIUM: 4.1 mmol/L (ref 3.5–5.3)
SODIUM: 133 mmol/L — AB (ref 135–146)
TOTAL PROTEIN: 6.7 g/dL (ref 6.1–8.1)

## 2016-12-10 LAB — LIPID PANEL
CHOL/HDL RATIO: 5 ratio — AB (ref <5.0)
Cholesterol: 171 mg/dL (ref <200)
HDL: 34 mg/dL — AB (ref >40)
LDL CALC: 94 mg/dL (ref <100)
Triglycerides: 214 mg/dL — ABNORMAL HIGH (ref <150)
VLDL: 43 mg/dL — ABNORMAL HIGH (ref <30)

## 2016-12-10 NOTE — Progress Notes (Signed)
Subjective:   [name] is a 83 y.o. male who presents for Medicare Annual/Subsequent preventive examination.     Objective:    Vitals: BP 130/66 (BP Location: Left Arm, Patient Position: Sitting)   Pulse 80   Temp 98 F (36.7 C) (Oral)   Ht '5\' 11"'  (1.803 m)   Wt 173 lb (78.5 kg)   SpO2 93%   BMI 24.13 kg/m   Body mass index is 24.13 kg/m.  Tobacco History  Smoking Status  . Former Smoker  . Types: Cigarettes  . Quit date: 08/02/1950  Smokeless Tobacco  . Never Used     Counseling given: Not Answered   Past Medical History:  Diagnosis Date  . Arthritis   . CAD (coronary artery disease)    s/p DES to LAD 06/2013  . Depression   . Dyslipidemia   . Glaucoma   . Hyperglycemia 04/17/2014  . Hyperlipidemia   . Hypertension   . Stroke ([place])   . Type 2 diabetes mellitus ([place])    Past Surgical History:  Procedure Laterality Date  . CARDIAC CATHETERIZATION  06/2013   DES to LAD by Dr Tamala Julian  . Right Cataract     Family History  Problem Relation Age of Onset  . Arthritis Mother   . Breast cancer Mother   . Arthritis Father   . Dementia Father   . Diabetes Other   . Arthritis Other   . Colon cancer Other   . Stroke Other   . Diabetes type II Sister   . Suicidality Brother   . Diabetes type I Brother   . Diabetes type I Brother   . Diabetes type I Brother   . Diabetes type II Sister   . Breast cancer Daughter   . Emphysema Daughter    History  Sexual Activity  . Sexual activity: Not Currently    Outpatient Encounter Prescriptions as of 12/10/2016  Medication Sig  . Alcohol Swabs (B-D SINGLE USE SWABS REGULAR) PADS Use as directed with checking blood sugar three times daily. Dx: E11.51  . amLODipine (NORVASC) 5 MG tablet Take 1 tablet (5 mg total) by mouth daily.  [name] aspirin EC 81 MG tablet Take 1 tablet (81 mg total) by mouth daily.  . Blood Glucose Calibration (ACCU-CHEK AVIVA) SOLN Use as Directed. Dx: E11.51  . Blood Glucose Monitoring Suppl  (ACCU-CHEK AVIVA PLUS) w/Device KIT Use as directed with checking blood sugar three times daily. Dx: E11.51  . escitalopram (LEXAPRO) 5 MG tablet Take 1 tablet daily  . fluticasone (FLONASE) 50 MCG/ACT nasal spray Place 1 spray into both nostrils daily.  [name] glucose blood (ACCU-CHEK AVIVA) test strip Use to test blood sugar three times daily. Dx: E11.51  . hydrochlorothiazide (HYDRODIURIL) 25 MG tablet TAKE 1 TABLET EVERY DAY  . Insulin Glargine (TOUJEO SOLOSTAR) 300 UNIT/ML SOPN Inject 18 Units into the skin at bedtime.  . Insulin Pen Needle 31G X 5 MM MISC Use as Directed. Dx: E11.51  . Lancet Devices (ACCU-CHEK SOFTCLIX) lancets Use as instructed with checking blood sugar three times daily. Dx: E11.51  . Loratadine 10 MG CAPS Take 1 capsule by mouth daily.  [name] losartan (COZAAR) 25 MG tablet Take 1 tablet (25 mg total) by mouth daily.  . Multiple Vitamins-Minerals (SENTRY SENIOR) TABS Take 1 tablet by mouth daily.  [name] NAMZARIC 28-10 MG CP24 TAKE 1 CAPSULE EVERY DAY  . simvastatin (ZOCOR) 20 MG tablet Take 1 tablet (20 mg total) by mouth daily.  No facility-administered encounter medications on file as of 12/10/2016.     Activities of Daily Living In your present state of health, do you have any difficulty performing the following activities: 12/10/2016  Hearing? Y  Vision? Y  Difficulty concentrating or making decisions? Y  Walking or climbing stairs? Y  Dressing or bathing? N  Doing errands, shopping? N  Preparing Food and eating ? N  Using the Toilet? N  In the past six months, have you accidently leaked urine? Y  Do you have problems with loss of bowel control? N  Managing your Medications? Y  Managing your Finances? Y  Housekeeping or managing your Housekeeping? Y  Some recent data might be hidden    Patient Care Team: Gildardo Cranker, DO as PCP - General (Internal Medicine) Hayden Pedro, MD (Ophthalmology)   Assessment:    Exercise Activities and Dietary  recommendations Current Exercise Habits: The patient does not participate in regular exercise at present, Exercise limited by: None identified  Goals    . Exercise 3x per week (30 min per time)          Starting 12/10/2016 I will become a part of a group that exercise.        Fall Risk Fall Risk  12/10/2016 09/22/2016 09/02/2016 07/23/2016 04/09/2016  Falls in the past year? No No No Yes Yes  Number falls in past yr: - - - 2 or more 1  Injury with Fall? - - - Yes No  Risk Factor Category  - - - - -   Depression Screen PHQ 2/9 Scores 12/10/2016 11/19/2015 07/25/2014 06/22/2013  PHQ - 2 Score 0 0 0 0    Cognitive Function MMSE - Mini Mental State Exam 12/10/2016 09/22/2016 09/19/2015 08/20/2015  Orientation to time 0 0 1 0  Orientation to Place '2 4 4 4  ' Registration '3 3 3 2  ' Attention/ Calculation 0 0 0 3  Recall 0 0 0 0  Language- name 2 objects '2 2 2 2  ' Language- repeat '1 1 1 1  ' Language- follow 3 step command '2 2 2 2  ' Language- read & follow direction '1 1 1 ' 0  Write a sentence 0 0 1 0  Copy design 0 0 0 0  Total score '11 13 15 14        ' Immunization History  Administered Date(s) Administered  . Influenza, High Dose Seasonal PF 06/22/2013, 05/30/2015  . Influenza,inj,Quad PF,36+ Mos 04/19/2014, 04/02/2016  . Pneumococcal Conjugate-13 07/25/2014  . Pneumococcal Polysaccharide-23 08/03/2011, 06/22/2013  . Td 10/22/2013  . Tdap 07/30/2015   Screening Tests Health Maintenance  Topic Date Due  . OPHTHALMOLOGY EXAM  08/17/2015  . HEMOGLOBIN A1C  01/19/2017  . INFLUENZA VACCINE  03/02/2017  . FOOT EXAM  04/02/2017  . TETANUS/TDAP  07/29/2025  . PNA vac Low Risk Adult  Completed      Plan:    I have personally reviewed and addressed the Medicare Annual Wellness questionnaire and have noted the following in the patient's chart:  A. Medical and social history B. Use of alcohol, tobacco or illicit drugs  C. Current medications and supplements D. Functional ability and  status E.  Nutritional status F.  Physical activity G. Advance directives H. List of other physicians I.  Hospitalizations, surgeries, and ER visits in previous 12 months J.  [place] to include hearing, vision, cognitive, depression L. Referrals and appointments - none  In addition, I have reviewed and discussed with patient certain  preventive protocols, quality metrics, and best practice recommendations. A written personalized care plan for preventive services as well as general preventive health recommendations were provided to patient.  See attached scanned questionnaire for additional information.   Signed,   Rich Reining, RN Nurse Health Advisor

## 2016-12-10 NOTE — Progress Notes (Signed)
   I reviewed health advisor's note, was available for consultation and agree with the assessment and plan as written.  Will copy Dr. Eulas Post about patient concerns and mmse results.  Annika Selke L. Amoni Scallan, D.O. [facility] [address], [place] [address] Cell Phone (Mon-Fri 8am-5pm):  [phone] On Call:  [phone] & follow prompts after 5pm & weekends Office Phone:  [phone] Office Fax:  [phone]   Quick Notes   Health Maintenance: Pt stated eye appointment will be made   Abnormal Screen: MMSE 11/30. Did not pass clock drawing   Patient Concerns: SOB with walking   Nurse Concerns: None

## 2016-12-10 NOTE — Patient Instructions (Addendum)
Mr. [name] , Thank you for taking time to come for your Medicare Wellness Visit. I appreciate your ongoing commitment to your health goals. Please review the following plan we discussed and let me know if I can assist you in the future.   Screening recommendations/referrals: Colonoscopy up to date Recommended yearly ophthalmology/optometry visit for glaucoma screening and checkup Recommended yearly dental visit for hygiene and checkup  Vaccinations: Influenza vaccine up to date Pneumococcal vaccine up to date Tdap vaccine up to date. Due 07/29/2025 Shingles vaccine stated done. If you want this we will put in prescription  Advanced directives: Please bring copy next time you come  Conditions/risks identified: Shortness of breath  Next appointment: Dr Eulas Post 5/30 @ 9:45 am  Preventive Care 65 Years and Older, Male Preventive care refers to lifestyle choices and visits with your health care provider that can promote health and wellness. What does preventive care include?  A yearly physical exam. This is also called an annual well check.  Dental exams once or twice a year.  Routine eye exams. Ask your health care provider how often you should have your eyes checked.  Personal lifestyle choices, including:  Daily care of your teeth and gums.  Regular physical activity.  Eating a healthy diet.  Avoiding tobacco and drug use.  Limiting alcohol use.  Practicing safe sex.  Taking low doses of aspirin every day.  Taking vitamin and mineral supplements as recommended by your health care provider. What happens during an annual well check? The services and screenings done by your health care provider during your annual well check will depend on your age, overall health, lifestyle risk factors, and family history of disease. Counseling  Your health care provider may ask you questions about your:  Alcohol use.  Tobacco use.  Drug use.  Emotional well-being.  Home and  relationship well-being.  Sexual activity.  Eating habits.  History of falls.  Memory and ability to understand (cognition).  Work and work Statistician. Screening  You may have the following tests or measurements:  Height, weight, and BMI.  Blood pressure.  Lipid and cholesterol levels. These may be checked every 5 years, or more frequently if you are over 41 years old.  Skin check.  Lung cancer screening. You may have this screening every year starting at age 62 if you have a 30-pack-year history of smoking and currently smoke or have quit within the past 15 years.  Fecal occult blood test (FOBT) of the stool. You may have this test every year starting at age 86.  Flexible sigmoidoscopy or colonoscopy. You may have a sigmoidoscopy every 5 years or a colonoscopy every 10 years starting at age 68.  Prostate cancer screening. Recommendations will vary depending on your family history and other risks.  Hepatitis C blood test.  Hepatitis B blood test.  Sexually transmitted disease (STD) testing.  Diabetes screening. This is done by checking your blood sugar (glucose) after you have not eaten for a while (fasting). You may have this done every 1-3 years.  Abdominal aortic aneurysm (AAA) screening. You may need this if you are a current or former smoker.  Osteoporosis. You may be screened starting at age 82 if you are at high risk. Talk with your health care provider about your test results, treatment options, and if necessary, the need for more tests. Vaccines  Your health care provider may recommend certain vaccines, such as:  Influenza vaccine. This is recommended every year.  Tetanus, diphtheria, and acellular  pertussis (Tdap, Td) vaccine. You may need a Td booster every 10 years.  Zoster vaccine. You may need this after age 18.  Pneumococcal 13-valent conjugate (PCV13) vaccine. One dose is recommended after age 31.  Pneumococcal polysaccharide (PPSV23) vaccine. One  dose is recommended after age 58. Talk to your health care provider about which screenings and vaccines you need and how often you need them. This information is not intended to replace advice given to you by your health care provider. Make sure you discuss any questions you have with your health care provider. Document Released: 08/15/2015 Document Revised: 04/07/2016 Document Reviewed: 05/20/2015 Elsevier Interactive Patient Education  2017 [place] Prevention in the Home Falls can cause injuries. They can happen to people of all ages. There are many things you can do to make your home safe and to help prevent falls. What can I do on the outside of my home?  Regularly fix the edges of walkways and driveways and fix any cracks.  Remove anything that might make you trip as you walk through a door, such as a raised step or threshold.  Trim any bushes or trees on the path to your home.  Use bright outdoor lighting.  Clear any walking paths of anything that might make someone trip, such as rocks or tools.  Regularly check to see if handrails are loose or broken. Make sure that both sides of any steps have handrails.  Any raised decks and porches should have guardrails on the edges.  Have any leaves, snow, or ice cleared regularly.  Use sand or salt on walking paths during winter.  Clean up any spills in your garage right away. This includes oil or grease spills. What can I do in the bathroom?  Use night lights.  Install grab bars by the toilet and in the tub and shower. Do not use towel bars as grab bars.  Use non-skid mats or decals in the tub or shower.  If you need to sit down in the shower, use a plastic, non-slip stool.  Keep the floor dry. Clean up any water that spills on the floor as soon as it happens.  Remove soap buildup in the tub or shower regularly.  Attach bath mats securely with double-sided non-slip rug tape.  Do not have throw rugs and other  things on the floor that can make you trip. What can I do in the bedroom?  Use night lights.  Make sure that you have a light by your bed that is easy to reach.  Do not use any sheets or blankets that are too big for your bed. They should not hang down onto the floor.  Have a firm chair that has side arms. You can use this for support while you get dressed.  Do not have throw rugs and other things on the floor that can make you trip. What can I do in the kitchen?  Clean up any spills right away.  Avoid walking on wet floors.  Keep items that you use a lot in easy-to-reach places.  If you need to reach something above you, use a strong step stool that has a grab bar.  Keep electrical cords out of the way.  Do not use floor polish or wax that makes floors slippery. If you must use wax, use non-skid floor wax.  Do not have throw rugs and other things on the floor that can make you trip. What can I do with my stairs?  Do not leave any items on the stairs.  Make sure that there are handrails on both sides of the stairs and use them. Fix handrails that are broken or loose. Make sure that handrails are as long as the stairways.  Check any carpeting to make sure that it is firmly attached to the stairs. Fix any carpet that is loose or worn.  Avoid having throw rugs at the top or bottom of the stairs. If you do have throw rugs, attach them to the floor with carpet tape.  Make sure that you have a light switch at the top of the stairs and the bottom of the stairs. If you do not have them, ask someone to add them for you. What else can I do to help prevent falls?  Wear shoes that:  Do not have high heels.  Have rubber bottoms.  Are comfortable and fit you well.  Are closed at the toe. Do not wear sandals.  If you use a stepladder:  Make sure that it is fully opened. Do not climb a closed stepladder.  Make sure that both sides of the stepladder are locked into place.  Ask  someone to hold it for you, if possible.  Clearly mark and make sure that you can see:  Any grab bars or handrails.  First and last steps.  Where the edge of each step is.  Use tools that help you move around (mobility aids) if they are needed. These include:  Canes.  Walkers.  Scooters.  Crutches.  Turn on the lights when you go into a dark area. Replace any light bulbs as soon as they burn out.  Set up your furniture so you have a clear path. Avoid moving your furniture around.  If any of your floors are uneven, fix them.  If there are any pets around you, be aware of where they are.  Review your medicines with your doctor. Some medicines can make you feel dizzy. This can increase your chance of falling. Ask your doctor what other things that you can do to help prevent falls. This information is not intended to replace advice given to you by your health care provider. Make sure you discuss any questions you have with your health care provider. Document Released: 05/15/2009 Document Revised: 12/25/2015 Document Reviewed: 08/23/2014 Elsevier Interactive Patient Education  2017 [employer].

## 2016-12-11 LAB — HEMOGLOBIN A1C

## 2016-12-13 ENCOUNTER — Telehealth: Payer: Self-pay

## 2016-12-13 DIAGNOSIS — N183 Chronic kidney disease, stage 3 unspecified: Secondary | ICD-10-CM

## 2016-12-13 DIAGNOSIS — E1122 Type 2 diabetes mellitus with diabetic chronic kidney disease: Secondary | ICD-10-CM

## 2016-12-13 DIAGNOSIS — Z794 Long term (current) use of insulin: Principal | ICD-10-CM

## 2016-12-13 MED ORDER — INSULIN GLARGINE 300 UNIT/ML ~~LOC~~ SOPN: 25.0000 [IU] | PEN_INJECTOR | Freq: Every day | SUBCUTANEOUS | 3 refills | Status: DC

## 2016-12-13 NOTE — Telephone Encounter (Signed)
-----   Message from [place], [place] sent at 12/12/2016  9:28 PM EDT ----- Diabetes is uncontrolled - increase toujeo to 25 units injection at bedtime; watch complex carbohydrates; kidney function worse and likely related to poorly controlled diabetes; increase insulin as instructed; bad/LDL cholesterol not at goal - is he taking simvastatin as ordered?; other labs stable; continue other medications as ordered; follow up as scheduled

## 2016-12-22 ENCOUNTER — Telehealth: Payer: Self-pay | Admitting: *Deleted

## 2016-12-22 NOTE — Telephone Encounter (Signed)
LMOM to return call.

## 2016-12-22 NOTE — Telephone Encounter (Signed)
Has he been ill recently?  Increase toujeo 30 units at bedtime. Watch complex carbs, f/u as scheduled. Ensure that med is being given correctly

## 2016-12-22 NOTE — Telephone Encounter (Signed)
Patient daughter, [name] called and stated that patient's blood sugar has spiked back up. Stated that patient is not eating or drinking anything not suppose to. Taking Toujeo 25 units bedtime. Please Advise.   First thing in the morning before food:  12/14/16- 439 12/15/16- 393 12/16/16- 323 12/20/16- 290 12/21/16- 289 12/22/16- 354

## 2016-12-23 NOTE — Telephone Encounter (Signed)
Routed to [place] to try and contact daughter/patient

## 2016-12-23 NOTE — Telephone Encounter (Signed)
Spoke with [name], patient with no recent illness. [name] will increase Toujeo as directed, medication list updated. Patient not in need of refill at the time of call.

## 2016-12-29 ENCOUNTER — Ambulatory Visit (INDEPENDENT_AMBULATORY_CARE_PROVIDER_SITE_OTHER): Payer: Medicare HMO | Admitting: Internal Medicine

## 2016-12-29 ENCOUNTER — Encounter: Payer: Self-pay | Admitting: Internal Medicine

## 2016-12-29 ENCOUNTER — Telehealth: Payer: Self-pay

## 2016-12-29 VITALS — BP 130/70 | HR 81 | Temp 97.7°F | Resp 12 | Ht 71.0 in | Wt 172.0 lb

## 2016-12-29 DIAGNOSIS — Z794 Long term (current) use of insulin: Secondary | ICD-10-CM

## 2016-12-29 DIAGNOSIS — E1122 Type 2 diabetes mellitus with diabetic chronic kidney disease: Secondary | ICD-10-CM

## 2016-12-29 DIAGNOSIS — R634 Abnormal weight loss: Secondary | ICD-10-CM

## 2016-12-29 DIAGNOSIS — F03B18 Unspecified dementia, moderate, with other behavioral disturbance: Secondary | ICD-10-CM

## 2016-12-29 DIAGNOSIS — E782 Mixed hyperlipidemia: Secondary | ICD-10-CM

## 2016-12-29 DIAGNOSIS — F0391 Unspecified dementia with behavioral disturbance: Secondary | ICD-10-CM

## 2016-12-29 DIAGNOSIS — N183 Chronic kidney disease, stage 3 unspecified: Secondary | ICD-10-CM

## 2016-12-29 DIAGNOSIS — I2510 Atherosclerotic heart disease of native coronary artery without angina pectoris: Secondary | ICD-10-CM

## 2016-12-29 DIAGNOSIS — I10 Essential (primary) hypertension: Secondary | ICD-10-CM | POA: Diagnosis not present

## 2016-12-29 DIAGNOSIS — R0609 Other forms of dyspnea: Secondary | ICD-10-CM

## 2016-12-29 DIAGNOSIS — Z8673 Personal history of transient ischemic attack (TIA), and cerebral infarction without residual deficits: Secondary | ICD-10-CM | POA: Diagnosis not present

## 2016-12-29 MED ORDER — ZOSTER VAC RECOMB ADJUVANTED 50 MCG/0.5ML IM SUSR: 0.5000 mL | Freq: Once | INTRAMUSCULAR | 1 refills | Status: AC

## 2016-12-29 MED ORDER — ZOSTER VAC RECOMB ADJUVANTED 50 MCG/0.5ML IM SUSR: 0.5000 mL | Freq: Once | INTRAMUSCULAR | 1 refills | Status: DC

## 2016-12-29 NOTE — Progress Notes (Signed)
Patient ID: [name], male   DOB: 1921/10/09, 83 y.o.   MRN: [id number]   Location:  [facility]  Place of Service:  OFFICE  Provider: Arletha Grippe, DO  Patient Care Team: Gildardo Cranker, DO as PCP - General (Internal Medicine) Hayden Pedro, MD (Ophthalmology) Macarthur Critchley, [place] as Referring Physician [facility])  Extended Emergency Contact Information Primary Emergency Contact: [name] Address: [address]          [place], [place] [address] [name] of [place] Phone: [phone] Mobile Phone: [phone] Relation: Daughter Secondary Emergency Contact: [name] [place] of [place] Phone: [phone] Mobile Phone: [phone] Relation: Other  Code Status:  Goals of Care: Advanced Directive information Advanced Directives 12/10/2016  Does Patient Have a Medical Advance Directive? Yes  Type of Paramedic of [place];Living will  Does patient want to make changes to medical advance directive? No - Patient declined  Copy of [name] in Chart? No - copy requested  Would patient like information on creating a medical advance directive? -     Chief Complaint  Patient presents with  . Medical Management of Chronic Issues    Extended visit for annual follow-up, AWV completed 12/11/11, MMSE (11/30) completed 12/11/11. Here with [name] (daughter)   . Medication Refill    No refills needed   . Health Maintenance    Pending eye exam 05/2017 Dr.Scott w/ [facility]   . Shortness of Breath    Oxygen level was at 88 % and increased after sitting for 5 min, patient c/o SOB with activity     HPI: Patient is a 83 y.o. male seen in today for an extended visit. He had AWV on 12/10/16. Weight down 8 lbs. Daughter reports increased DOE with ambulating <50 ft. O2 sats 88% after ambulating today. He declined to get undressed for exam and declined ECG. He has not seen cardio in > 1 yr.  AWV 12/10/16 NOTE  REVIEWED  DM - A1c >14% (prev 7.6%). Toujeo increased to 25 units on 12/12/16 -->30 units on 12/22/16. Off metformin due to worsening renal fxn. BS at home 200 - 280. No low BS reactions. No numbness/tingling in hands/feet. Urine microalbumin/cr ratio 33. He gets a cough with lisinopril. Currently taking losartan  HTN/CAD - s/p DES in 2014. BP stable on losartan, amlodipine and HCTZ. Takes ASA daily. Followed by cardio Dr Meda Coffee. LDL 94. 2D echo in Feb 2017 showed EF 65-70% and no wall motion abnormalities but does have grade 1 DD;mild-mod AI; mild MR  Dementia with behavioral disturbance - was improved on namzaric but he has not been taking it every day. He takes lexapro for depression. Albumin 4.4. Weight down 8lbs. MMSE 11/30. Followed by neurology Dr Delice Lesch  Hyperlipidemia - stable on simvastatin. LDL 94; HDL 34  Depression - mood stable on lexapro  Glaucoma - followed by eye specialist at [facility]. Currently not on meds  Arthritis - joint pain stable  CKD - stage 3. Cr 1.48  Hx CVA - stable on ASA daily  Seasonal allergy - stable on claritin    Depression screen [facility] 2/9 12/29/2016 12/10/2016 11/19/2015 07/25/2014 06/22/2013  Decreased Interest 0 0 0 0 0  Down, Depressed, Hopeless 0 0 0 0 0  PHQ - 2 Score 0 0 0 0 0    Fall Risk  12/29/2016 12/10/2016 09/22/2016 09/02/2016 07/23/2016  Falls in the past year? No No No No Yes  Number falls in past  yr: - - - - 2 or more  Injury with Fall? - - - - Yes  Risk Factor Category  - - - - -   MMSE - Mini Mental State Exam 12/10/2016 09/22/2016 09/19/2015 08/20/2015  Orientation to time 0 0 1 0  Orientation to Place '2 4 4 4  ' Registration '3 3 3 2  ' Attention/ Calculation 0 0 0 3  Recall 0 0 0 0  Language- name 2 objects '2 2 2 2  ' Language- repeat '1 1 1 1  ' Language- follow 3 step command '2 2 2 2  ' Language- read & follow direction '1 1 1 ' 0  Write a sentence 0 0 1 0  Copy design 0 0 0 0  Total score '11 13 15 14     ' Health  Maintenance  Topic Date Due  . OPHTHALMOLOGY EXAM  06/01/2017 (Originally 08/17/2015)  . INFLUENZA VACCINE  03/02/2017  . FOOT EXAM  04/02/2017  . HEMOGLOBIN A1C  06/12/2017  . TETANUS/TDAP  07/29/2025  . PNA vac Low Risk Adult  Completed    Past Medical History:  Diagnosis Date  . Arthritis   . CAD (coronary artery disease)    s/p DES to LAD 06/2013  . Depression   . Dyslipidemia   . Glaucoma   . Hyperglycemia 04/17/2014  . Hyperlipidemia   . Hypertension   . Stroke ([place])   . Type 2 diabetes mellitus ([place])     Past Surgical History:  Procedure Laterality Date  . CARDIAC CATHETERIZATION  06/2013   DES to LAD by Dr Tamala Julian  . Right Cataract      Family History  Problem Relation Age of Onset  . Arthritis Mother   . Breast cancer Mother   . Arthritis Father   . Dementia Father   . Diabetes Other   . Arthritis Other   . Colon cancer Other   . Stroke Other   . Diabetes type II Sister   . Suicidality Brother   . Diabetes type I Brother   . Diabetes type I Brother   . Diabetes type I Brother   . Diabetes type II Sister   . Breast cancer Daughter   . Emphysema Daughter    Family Status  Relation Status  . Mother Deceased  . Father Deceased  . Other (Not Specified)  . Sister Alive  . Brother Deceased  . Brother Alive  . Brother Alive  . Brother Deceased  . Sister Alive  . Daughter Deceased  . Daughter Alive  . Son Alive  . Son Alive  . Daughter Deceased    Social History   Social History  . Marital status: Widowed    Spouse name: N/A  . Number of children: 4  . Years of education: N/A   Occupational History  . Retired    Social History Main Topics  . Smoking status: Former Smoker    Types: Cigarettes    Quit date: 08/02/1950  . Smokeless tobacco: Never Used  . Alcohol use No  . Drug use: No  . Sexual activity: Not Currently   Other Topics Concern  . Not on file   Social History Narrative   Retired Warden/ranger   widowed, lives with dtr  and youngest son   5 kids from first marriage    Allergies  Allergen Reactions  . Lisinopril Cough    Allergies as of 12/29/2016      Reactions   Lisinopril Cough  Medication List       Accurate as of 12/29/16 10:23 AM. Always use your most recent med list.          ACCU-CHEK AVIVA PLUS w/Device Kit Use as directed with checking blood sugar three times daily. Dx: E11.51   ACCU-CHEK AVIVA Soln Use as Directed. Dx: E11.51   accu-chek softclix lancets Use as instructed with checking blood sugar three times daily. Dx: E11.51   amLODipine 5 MG tablet Commonly known as:  NORVASC Take 1 tablet (5 mg total) by mouth daily.   aspirin EC 81 MG tablet Take 1 tablet (81 mg total) by mouth daily.   B-D SINGLE USE SWABS REGULAR Pads Use as directed with checking blood sugar three times daily. Dx: E11.51   escitalopram 5 MG tablet Commonly known as:  LEXAPRO Take 1 tablet daily   fluticasone 50 MCG/ACT nasal spray Commonly known as:  FLONASE Place 1 spray into both nostrils daily.   glucose blood test strip Commonly known as:  ACCU-CHEK AVIVA Use to test blood sugar three times daily. Dx: E11.51   hydrochlorothiazide 25 MG tablet Commonly known as:  HYDRODIURIL TAKE 1 TABLET EVERY DAY   Insulin Pen Needle 31G X 5 MM Misc Use as Directed. Dx: E11.51   Loratadine 10 MG Caps Take 1 capsule by mouth daily.   losartan 25 MG tablet Commonly known as:  COZAAR Take 1 tablet (25 mg total) by mouth daily.   NAMZARIC 28-10 MG Cp24 Generic drug:  Memantine HCl-Donepezil HCl TAKE 1 CAPSULE EVERY DAY   SENTRY SENIOR Tabs Take 1 tablet by mouth daily.   simvastatin 20 MG tablet Commonly known as:  ZOCOR Take 1 tablet (20 mg total) by mouth daily.   TOUJEO SOLOSTAR 300 UNIT/ML Sopn Generic drug:  Insulin Glargine Inject 30 Units into the skin at bedtime.        Review of Systems:  Review of Systems  Unable to perform ROS: Dementia    Physical  Exam: Vitals:   12/29/16 0938  BP: 130/70  Pulse: 81  Resp: 12  Temp: 97.7 F (36.5 C)  TempSrc: Oral  SpO2: 93%  Weight: 172 lb (78 kg)  Height: '5\' 11"'  (1.803 m)   Body mass index is 23.99 kg/m. Physical Exam  Constitutional: He appears well-developed and well-nourished.  HENT:  Mouth/Throat: Oropharynx is clear and moist.  Eyes: Pupils are equal, round, and reactive to light. No scleral icterus.  Neck: Neck supple. Carotid bruit is not present. No thyromegaly present.  Cardiovascular: Normal rate, regular rhythm, normal heart sounds and intact distal pulses.  Exam reveals no gallop and no friction rub.   No murmur heard. no distal LE swelling. No calf TTP  Pulmonary/Chest: Effort normal and breath sounds normal. He has no wheezes. He has no rales. He exhibits no tenderness.  Abdominal: Soft. Bowel sounds are normal. He exhibits no distension, no abdominal bruit, no pulsatile midline mass and no mass. There is no hepatomegaly. There is no tenderness. There is no rebound and no guarding.  Genitourinary:  Genitourinary Comments: Deferred due to age  Musculoskeletal: He exhibits edema.  Lymphadenopathy:    He has no cervical adenopathy.  Neurological: He is alert. He has normal reflexes.  Skin: Skin is warm and dry. No rash noted.  Psychiatric: He has a normal mood and affect. His behavior is normal.    Labs reviewed:  Basic Metabolic Panel:  Recent Labs  04/02/16 0957 07/21/16 0840 12/10/16 0913  NA 133*  139 133*  K 4.6 4.0 4.1  CL 92* 102 93*  CO2 31 28 32*  GLUCOSE 421* 153* 379*  BUN '18 18 17  ' CREATININE 1.40* 1.14* 1.48*  CALCIUM 10.1 9.6 9.6  TSH 2.12  --   --    Liver Function Tests:  Recent Labs  04/02/16 0957 07/21/16 0840 12/10/16 0913  AST 32 52* 38*  ALT 24 43 38  ALKPHOS 53 45 60  BILITOT 0.7 0.4 0.4  PROT 6.6 6.5 6.7  ALBUMIN 4.3 4.1 4.2   No results for input(s): LIPASE, AMYLASE in the last 8760 hours. No results for input(s): AMMONIA  in the last 8760 hours. CBC:  Recent Labs  04/02/16 0957  WBC 4.4  NEUTROABS 2,068  HGB 13.0*  HCT 39.0  MCV 92.2  PLT 170   Lipid Panel:  Recent Labs  04/02/16 0957 07/21/16 0840 12/10/16 0913  CHOL 205* 154 171  HDL 50 41 34*  LDLCALC 127 92 94  TRIG 139 106 214*  CHOLHDL 4.1 3.8 5.0*   Lab Results  Component Value Date   HGBA1C >14.0 (H) 12/10/2016    Procedures: No results found.  Assessment/Plan   ICD-9-CM ICD-10-CM   1. Dyspnea on exertion 786.09 R06.09 Ambulatory referral to Cardiology  2. Coronary artery disease involving native coronary artery of native heart without angina pectoris 414.01 I25.10 Ambulatory referral to Cardiology  3. Type 2 diabetes mellitus with stage 3 chronic kidney disease, with long-term current use of insulin (HCC) 250.40 E11.22    585.3 N18.3    V58.67 Z79.4   4. Mixed hyperlipidemia 272.2 E78.2   5. Loss of weight 783.21 R63.4   6. Moderate dementia with behavioral disturbance 294.21 F03.91   7. Essential hypertension 401.9 I10   8. History of stroke V12.54 Z86.73    Increase Toujeo to 35 units at bedtime  Continue other medications as ordered  Will call with referral to Dr Meda Coffee for CAD/increased dyspnea  Follow up in 3 mos for dementia and DM. Fasting labs prior to appt  [place] S. Perlie Gold  [facility] and Adult Medicine [address] [place], [place] [address] [phone] Cell (Monday-Friday 8 AM - 5 PM) [phone] After 5 PM and follow prompts

## 2016-12-29 NOTE — Telephone Encounter (Signed)
I called [employer] and spoke with representative to have them Void rx for shingles vaccine sent in error

## 2016-12-29 NOTE — Patient Instructions (Signed)
Increase Toujeo to 35 units at bedtime  Continue other medications as ordered  Will call with referral to Dr Meda Coffee for CAD/increased dyspnea  Follow up in 3 mos for dementia and DM. Fasting labs prior to appt

## 2017-01-20 ENCOUNTER — Encounter: Payer: Self-pay | Admitting: Nurse Practitioner

## 2017-01-20 ENCOUNTER — Ambulatory Visit (INDEPENDENT_AMBULATORY_CARE_PROVIDER_SITE_OTHER): Payer: Medicare HMO | Admitting: Nurse Practitioner

## 2017-01-20 VITALS — BP 132/68 | HR 63 | Temp 98.0°F | Resp 17 | Ht 71.0 in | Wt 178.6 lb

## 2017-01-20 DIAGNOSIS — Z794 Long term (current) use of insulin: Secondary | ICD-10-CM | POA: Diagnosis not present

## 2017-01-20 DIAGNOSIS — N183 Chronic kidney disease, stage 3 (moderate): Secondary | ICD-10-CM | POA: Diagnosis not present

## 2017-01-20 DIAGNOSIS — E1122 Type 2 diabetes mellitus with diabetic chronic kidney disease: Secondary | ICD-10-CM

## 2017-01-20 MED ORDER — ZOSTER VAC RECOMB ADJUVANTED 50 MCG/0.5ML IM SUSR: 0.5000 mL | Freq: Once | INTRAMUSCULAR | 1 refills | Status: AC

## 2017-01-20 MED ORDER — INSULIN GLARGINE 300 UNIT/ML ~~LOC~~ SOPN: 35.0000 [IU] | PEN_INJECTOR | Freq: Every day | SUBCUTANEOUS | 2 refills | Status: DC

## 2017-01-20 NOTE — Progress Notes (Signed)
Careteam: Patient Care Team: Gildardo Cranker, DO as PCP - General (Internal Medicine) Hayden Pedro, MD (Ophthalmology) Macarthur Critchley, [place] as Referring Physician (Optometry)  Advanced Directive information Does Patient Have a Medical Advance Directive?: Yes, Type of Advance Directive: [place];Living will  Allergies  Allergen Reactions  . Lisinopril Cough    Chief Complaint  Patient presents with  . Acute Visit    Pt is being seen due to elevated fasting blood glucose readings. Pt has glucose log with him today.   . Other    Daugher in room with pt.     HPI: Patient is a 83 y.o. male seen in the office today due to elevated blood sugars.   A1c >14% (prev 7.6%). Toujeo increased to 25 units on 12/12/16 -->30 units on 12/22/16. Then on 12/29/16 was increased to 35 units. Daughter reports she is only given 30 units. Off metformin due to worsening renal fxn. BS at home 180-243. No low BS reactions.   no numbess/tingling in hands or feet. No blurred vision. Reports more frequent urination.  Eating a lot of fruit.  Eating well per daughter. Eats 3 meals a day.  Weight has improved.   Review of Systems:  Review of Systems  Unable to perform ROS: Dementia    Past Medical History:  Diagnosis Date  . Arthritis   . CAD (coronary artery disease)    s/p DES to LAD 06/2013  . Depression   . Dyslipidemia   . Glaucoma   . Hyperglycemia 04/17/2014  . Hyperlipidemia   . Hypertension   . Stroke ([place])   . Type 2 diabetes mellitus ([name])    Past Surgical History:  Procedure Laterality Date  . CARDIAC CATHETERIZATION  06/2013   DES to LAD by Dr Tamala Julian  . Right Cataract     Social History:   reports that he quit smoking about 66 years ago. His smoking use included Cigarettes. He has never used smokeless tobacco. He reports that he does not drink alcohol or use drugs.  Family History  Problem Relation Age of Onset  . Arthritis Mother   . Breast cancer Mother     . Arthritis Father   . Dementia Father   . Diabetes Other   . Arthritis Other   . Colon cancer Other   . Stroke Other   . Diabetes type II Sister   . Suicidality Brother   . Diabetes type I Brother   . Diabetes type I Brother   . Diabetes type I Brother   . Diabetes type II Sister   . Breast cancer Daughter   . Emphysema Daughter     Medications: Patient's Medications  New Prescriptions   No medications on file  Previous Medications   ALCOHOL SWABS (B-D SINGLE USE SWABS REGULAR) PADS    Use as directed with checking blood sugar three times daily. Dx: E11.51   AMLODIPINE (NORVASC) 5 MG TABLET    Take 1 tablet (5 mg total) by mouth daily.   ASPIRIN EC 81 MG TABLET    Take 1 tablet (81 mg total) by mouth daily.   BLOOD GLUCOSE CALIBRATION (ACCU-CHEK AVIVA) SOLN    Use as Directed. Dx: E11.51   BLOOD GLUCOSE MONITORING SUPPL (ACCU-CHEK AVIVA PLUS) W/DEVICE KIT    Use as directed with checking blood sugar three times daily. Dx: E11.51   ESCITALOPRAM (LEXAPRO) 5 MG TABLET    Take 1 tablet daily   FLUTICASONE (FLONASE) 50 MCG/ACT NASAL  SPRAY    Place 1 spray into both nostrils daily.   GLUCOSE BLOOD (ACCU-CHEK AVIVA) TEST STRIP    Use to test blood sugar three times daily. Dx: E11.51   HYDROCHLOROTHIAZIDE (HYDRODIURIL) 25 MG TABLET    TAKE 1 TABLET EVERY DAY   INSULIN GLARGINE (TOUJEO SOLOSTAR) 300 UNIT/ML SOPN    Inject 30 Units into the skin at bedtime.   INSULIN PEN NEEDLE 31G X 5 MM MISC    Use as Directed. Dx: E11.51   LANCET DEVICES (ACCU-CHEK SOFTCLIX) LANCETS    Use as instructed with checking blood sugar three times daily. Dx: E11.51   LORATADINE 10 MG CAPS    Take 1 capsule by mouth daily.   LOSARTAN (COZAAR) 25 MG TABLET    Take 1 tablet (25 mg total) by mouth daily.   MULTIPLE VITAMINS-MINERALS (SENTRY SENIOR) TABS    Take 1 tablet by mouth daily.   NAMZARIC 28-10 MG CP24    TAKE 1 CAPSULE EVERY DAY   SIMVASTATIN (ZOCOR) 20 MG TABLET    Take 1 tablet (20 mg total) by mouth  daily.  Modified Medications   Modified Medication Previous Medication   ZOSTER VAC RECOMB ADJUVANTED (SHINGRIX) INJECTION Zoster Vac Recomb Adjuvanted (SHINGRIX) injection      Inject 0.5 mLs into the muscle once.    Inject 0.5 mLs into the muscle once.  Discontinued Medications   No medications on file     Physical Exam:  Vitals:   01/20/17 0930  BP: 132/68  Pulse: 63  Resp: 17  Temp: 98 F (36.7 C)  TempSrc: Oral  SpO2: 98%  Weight: 178 lb 9.6 oz (81 kg)  Height: _0  (1.803 m)   Body mass index is 24.91 kg/m.  Physical Exam  Constitutional: He appears well-developed and well-nourished.  HENT:  Mouth/Throat: Oropharynx is clear and moist.  Eyes: Pupils are equal, round, and reactive to light. No scleral icterus.  Neck: Neck supple. Carotid bruit is not present. No thyromegaly present.  Cardiovascular: Normal rate, regular rhythm and normal heart sounds.   no distal LE swelling. No calf TTP  Pulmonary/Chest: Effort normal and breath sounds normal. He has no wheezes. He has no rales. He exhibits no tenderness.  Abdominal: Soft. Bowel sounds are normal. He exhibits no distension, no abdominal bruit, no pulsatile midline mass and no mass. There is no hepatomegaly. There is no tenderness. There is no rebound and no guarding.  Musculoskeletal: He exhibits edema (trace).  Lymphadenopathy:    He has no cervical adenopathy.  Neurological: He is alert. He has normal reflexes.  Skin: Skin is warm and dry. No rash noted.  Psychiatric: He has a normal mood and affect. His behavior is normal.    Labs reviewed: Basic Metabolic Panel:  Recent Labs  04/02/16 0957 07/21/16 0840 12/10/16 0913  NA 133* 139 133*  K 4.6 4.0 4.1  CL 92* 102 93*  CO2 31 28 32*  GLUCOSE 421* 153* 379*  BUN _1 CREATININE 1.40* 1.14* 1.48*  CALCIUM 10.1 9.6 9.6  TSH 2.12  --   --    Liver Function Tests:  Recent Labs  04/02/16 0957 07/21/16 0840 12/10/16 0913  AST 32 52* 38*    ALT 24 43 38  ALKPHOS 53 45 60  BILITOT 0.7 0.4 0.4  PROT 6.6 6.5 6.7  ALBUMIN 4.3 4.1 4.2   No results for input(s): LIPASE, AMYLASE in the last 8760 hours. No results for input(s): AMMONIA in  the last 8760 hours. CBC:  Recent Labs  04/02/16 0957  WBC 4.4  NEUTROABS 2,068  HGB 13.0*  HCT 39.0  MCV 92.2  PLT 170   Lipid Panel:  Recent Labs  04/02/16 0957 07/21/16 0840 12/10/16 0913  CHOL 205* 154 171  HDL 50 41 34*  LDLCALC 127 92 94  TRIG 139 106 214*  CHOLHDL 4.1 3.8 5.0*   TSH:  Recent Labs  04/02/16 0957  TSH 2.12   A1C: Lab Results  Component Value Date   HGBA1C >14.0 (H) 12/10/2016     Assessment/Plan 1. Type 2 diabetes mellitus with stage 3 chronic kidney disease, with long-term current use of insulin (HCC) - currently getting toujeo 30 units. Will increase to 35 units at this time. -to eat 3 nutritious meals a day.  -educated on hypoglycemia -goal for fasting blood sugars to be less than 150 however to call if consistently less than 100.   Carlos American. Harle Battiest  [facility] & Adult Medicine [phone] 8 am - 5 pm) [phone] (after hours)

## 2017-01-20 NOTE — Patient Instructions (Signed)
Increase toujeo to 35 units at night.  Goal blood sugars should be around 150 fasting.  Notify if blood sugars remain high or start dropping below 100

## 2017-02-06 NOTE — Progress Notes (Signed)
Cardiology Office Note    Date:  02/07/2017   ID:  Corey Laski, DOB 1922-04-10, MRN [id number]  PCP:  Gildardo Cranker, DO  Cardiologist: Sinclair Grooms, MD   Chief Complaint  Patient presents with  . Coronary Artery Disease    History of Present Illness:  [name] is a 83 y.o. male  Referred by Gildardo Cranker, DO, for DOE.  Past history of CAD with LAD DES(Cypher) 2004, DM II with complications, dementia, hyperlipidemia, and hypertension. The patient has significant dementia and is not very helpful with reference to relaying symptoms. According to his daughter, who lives with him, he has had no chest pain complaints. She notices that with exertion he is breathing heavily. Activities such as taking the garbage down, walking from the house to the car, causing him to noticeably struggled to breathe. He denies orthopnea. He denies lower extremity swelling. No palpitations, syncope, or other complaints.    Past Medical History:  Diagnosis Date  . Arthritis   . CAD (coronary artery disease)    s/p DES to LAD 06/2013  . Depression   . Dyslipidemia   . Glaucoma   . Hyperglycemia 04/17/2014  . Hyperlipidemia   . Hypertension   . Stroke ([place])   . Type 2 diabetes mellitus ([place])     Past Surgical History:  Procedure Laterality Date  . CARDIAC CATHETERIZATION  06/2013   DES to LAD by Dr Tamala Julian  . Right Cataract      Current Medications: Outpatient Medications Prior to Visit  Medication Sig Dispense Refill  . Alcohol Swabs (B-D SINGLE USE SWABS REGULAR) PADS Use as directed with checking blood sugar three times daily. Dx: E11.51 300 each 3  . amLODipine (NORVASC) 5 MG tablet Take 1 tablet (5 mg total) by mouth daily. 90 tablet 3  . aspirin EC 81 MG tablet Take 1 tablet (81 mg total) by mouth daily. 150 tablet 2  . Blood Glucose Calibration (ACCU-CHEK AVIVA) SOLN Use as Directed. Dx: E11.51 3 each 3  . Blood Glucose Monitoring Suppl (ACCU-CHEK AVIVA PLUS) w/Device KIT Use as  directed with checking blood sugar three times daily. Dx: E11.51 1 kit 0  . escitalopram (LEXAPRO) 5 MG tablet Take 1 tablet daily 30 tablet 11  . fluticasone (FLONASE) 50 MCG/ACT nasal spray Place 1 spray into both nostrils daily. 16 g 6  . glucose blood (ACCU-CHEK AVIVA) test strip Use to test blood sugar three times daily. Dx: E11.51 300 each 3  . hydrochlorothiazide (HYDRODIURIL) 25 MG tablet TAKE 1 TABLET EVERY DAY 90 tablet 3  . Insulin Glargine (TOUJEO SOLOSTAR) 300 UNIT/ML SOPN Inject 35 Units into the skin at bedtime. 3 mL 2  . Insulin Pen Needle 31G X 5 MM MISC Use as Directed. Dx: E11.51 300 each 3  . Lancet Devices (ACCU-CHEK SOFTCLIX) lancets Use as instructed with checking blood sugar three times daily. Dx: E11.51 300 each 3  . Loratadine 10 MG CAPS Take 1 capsule by mouth daily.    [name] losartan (COZAAR) 25 MG tablet Take 1 tablet (25 mg total) by mouth daily. 90 tablet 3  . Multiple Vitamins-Minerals (SENTRY SENIOR) TABS Take 1 tablet by mouth daily.    [name] NAMZARIC 28-10 MG CP24 TAKE 1 CAPSULE EVERY DAY 90 capsule 1  . simvastatin (ZOCOR) 20 MG tablet Take 1 tablet (20 mg total) by mouth daily. 90 tablet 1   No facility-administered medications prior to visit.      Allergies:  Lisinopril   Social History   Social History  . Marital status: Widowed    Spouse name: N/A  . Number of children: 4  . Years of education: N/A   Occupational History  . Retired    Social History Main Topics  . Smoking status: Former Smoker    Types: Cigarettes    Quit date: 08/02/1950  . Smokeless tobacco: Never Used  . Alcohol use No  . Drug use: No  . Sexual activity: Not Currently   Other Topics Concern  . None   Social History Narrative   Retired Warden/ranger   widowed, lives with dtr and youngest son   5 kids from first marriage     Family History:  The patient's family history includes Arthritis in his father, mother, and other; Breast cancer in his daughter and mother; Colon  cancer in his other; Dementia in his father; Diabetes in his other; Diabetes type I in his brother, brother, and brother; Diabetes type II in his sister and sister; Emphysema in his daughter; Stroke in his other; Suicidality in his brother.   ROS:   Please see the history of present illness.    Has difficulty articulating any complaints and defers to his daughter. No difficulty sleeping. Denies lower extremity swelling.  All other systems reviewed and are negative.   PHYSICAL EXAM:   VS:  BP 140/64   Pulse 79   Ht '5\' 11"'  (1.803 m)   Wt 183 lb 12.8 oz (83.4 kg)   BMI 25.63 kg/m    GEN: Well nourished, well developed, in no acute distress  HEENT: normal  Neck: Moderate bilateral JVD. No carotid bruits, or masses Cardiac: RRR; no murmurs, rubs, or gallops, but there is trace ankle edema bilaterally Respiratory:  clear to auscultation bilaterally, normal work of breathing GI: soft, nontender, nondistended, + BS MS: no deformity or atrophy  Skin: warm and dry, no rash Neuro:  Alert and Oriented x one. Did not know where he was or what date. No focal motor deficits. Psych: Easily perturbed. Has difficulty engaging in conversation. Unable to her simple questions.  Wt Readings from Last 3 Encounters:  02/07/17 183 lb 12.8 oz (83.4 kg)  01/20/17 178 lb 9.6 oz (81 kg)  12/29/16 172 lb (78 kg)      Studies/Labs Reviewed:   EKG:  EKG  Normal sinus rhythm with normal PR, precordial T-wave inversion unchanged from February 2017.  Recent Labs: 04/02/2016: Hemoglobin 13.0; Platelets 170; TSH 2.12 12/10/2016: ALT 38; BUN 17; Creat 1.48; Potassium 4.1; Sodium 133   Lipid Panel    Component Value Date/Time   CHOL 171 12/10/2016 0913   TRIG 214 (H) 12/10/2016 0913   HDL 34 (L) 12/10/2016 0913   CHOLHDL 5.0 (H) 12/10/2016 0913   VLDL 43 (H) 12/10/2016 0913   LDLCALC 94 12/10/2016 0913   LDLDIRECT 92.0 11/24/2015 0723    Additional studies/ records that were reviewed today include:    Echocardiogram performed 09/11/15: Study Conclusions  - Left ventricle: The cavity size was normal. There was moderate   concentric hypertrophy. Systolic function was vigorous. The   estimated ejection fraction was in the range of 65% to 70%. Wall   motion was normal; there were no regional wall motion   abnormalities. Doppler parameters are consistent with abnormal   left ventricular relaxation (grade 1 diastolic dysfunction).   There was no evidence of elevated ventricular filling pressure by   Doppler parameters. - Aortic valve: Trileaflet; normal thickness  leaflets. There was   mild to moderate regurgitation. - Aortic root: The aortic root was normal in size. - Mitral valve: There was mild regurgitation. - Left atrium: The atrium was normal in size. - Right ventricle: Systolic function was normal. - Right atrium: The atrium was normal in size. - Tricuspid valve: There was mild regurgitation. - Pulmonary arteries: The main pulmonary artery was normal-sized.   Systolic pressure was within the normal range. - Inferior vena cava: The vessel was normal in size. The   respirophasic diameter changes were in the normal range (= 50%),   consistent with normal central venous pressure. - Pericardium, extracardiac: There was no pericardial effusion.   ASSESSMENT:    1. SOB (shortness of breath)   2. Coronary artery disease involving native coronary artery of native heart with angina pectoris ([place])   3. Essential hypertension   4. Hypertensive heart disease without heart failure   5. Mixed hyperlipidemia   6. Type 2 diabetes, controlled, with peripheral circulatory disorder ([place])   7. Kidney disease, chronic, stage III (moderate, EGFR 30-59 ml/min)      PLAN:  In order of problems listed above:  1. The complaint of shortness of breath is more observational from the viewpoint of his daughter who lives with him. He does not feel short of breath. We will check a PA and lateral chest  x-ray and brain natruretic peptide. There is some evidence of volume overload on exam with elevated neck veins and trace bilateral ankle edema. He is already on diuretic therapy in the form of HydroDiuril. This data will help determine if further diuresis would be helpful. If no evidence of CHF on either test, consider adding a long-acting nitrate to treat ischemia. Given his mental status, I would not pursue invasive evaluation. It does appear that he could easily have chronic diastolic heart failure producing exertional dyspnea given LVH on echo noted above and his age. 2. Cypher stent placed in 2004. No symptoms of angina. Dyspnea could be an anginal equivalent. 3. Blood pressure is under excellent control for age. 4. Not addressed 5. Not addressed 6. Not addressed 7. Not addressed    Medication Adjustments/Labs and Tests Ordered: Current medicines are reviewed at length with the patient today.  Concerns regarding medicines are outlined above.  Medication changes, Labs and Tests ordered today are listed in the Patient Instructions below. Patient Instructions  Medication Instructions:  None  Labwork: BNP today  Testing/Procedures: A chest x-ray takes a picture of the organs and structures inside the chest, including the heart, lungs, and blood vessels. This test can show several things, including, whether the heart is enlarges; whether fluid is building up in the lungs; and whether pacemaker / defibrillator leads are still in place.   Follow-Up: Your physician recommends that you schedule a follow-up appointment as needed with Dr. Tamala Julian.   Any Other Special Instructions Will Be Listed Below (If Applicable).  [facility] [address]   If you need a refill on your cardiac medications before your next appointment, please call your pharmacy.      Signed, Sinclair Grooms, MD  02/07/2017 10:09 AM    [facility] [place],  [place], [place]  [address] Phone: [phone]; Fax: [phone]

## 2017-02-07 ENCOUNTER — Ambulatory Visit (INDEPENDENT_AMBULATORY_CARE_PROVIDER_SITE_OTHER): Payer: Medicare HMO | Admitting: Interventional Cardiology

## 2017-02-07 ENCOUNTER — Encounter: Payer: Self-pay | Admitting: Interventional Cardiology

## 2017-02-07 ENCOUNTER — Ambulatory Visit
Admission: RE | Admit: 2017-02-07 | Discharge: 2017-02-07 | Disposition: A | Discharge status: Home / Self Care | Payer: Medicare HMO | Source: Ambulatory Visit | Attending: Interventional Cardiology | Admitting: Interventional Cardiology

## 2017-02-07 VITALS — BP 140/64 | HR 79 | Ht 71.0 in | Wt 183.8 lb

## 2017-02-07 DIAGNOSIS — N183 Chronic kidney disease, stage 3 unspecified: Secondary | ICD-10-CM

## 2017-02-07 DIAGNOSIS — I10 Essential (primary) hypertension: Secondary | ICD-10-CM

## 2017-02-07 DIAGNOSIS — R0602 Shortness of breath: Secondary | ICD-10-CM | POA: Diagnosis not present

## 2017-02-07 DIAGNOSIS — I119 Hypertensive heart disease without heart failure: Secondary | ICD-10-CM

## 2017-02-07 DIAGNOSIS — I25119 Atherosclerotic heart disease of native coronary artery with unspecified angina pectoris: Secondary | ICD-10-CM

## 2017-02-07 DIAGNOSIS — E1151 Type 2 diabetes mellitus with diabetic peripheral angiopathy without gangrene: Secondary | ICD-10-CM

## 2017-02-07 DIAGNOSIS — E782 Mixed hyperlipidemia: Secondary | ICD-10-CM | POA: Diagnosis not present

## 2017-02-07 DIAGNOSIS — R05 Cough: Secondary | ICD-10-CM | POA: Diagnosis not present

## 2017-02-07 LAB — PRO B NATRIURETIC PEPTIDE: NT-PRO BNP: 194 pg/mL (ref 0–486)

## 2017-02-07 NOTE — Patient Instructions (Signed)
Medication Instructions:  None  Labwork: BNP today  Testing/Procedures: A chest x-ray takes a picture of the organs and structures inside the chest, including the heart, lungs, and blood vessels. This test can show several things, including, whether the heart is enlarges; whether fluid is building up in the lungs; and whether pacemaker / defibrillator leads are still in place.   Follow-Up: Your physician recommends that you schedule a follow-up appointment as needed with Dr. Tamala Julian.   Any Other Special Instructions Will Be Listed Below (If Applicable).  [facility] [place] [address]   If you need a refill on your cardiac medications before your next appointment, please call your pharmacy.

## 2017-02-07 NOTE — Addendum Note (Signed)
Addended by: Loren Racer on: 02/07/2017 10:13 AM   Modules accepted: Orders

## 2017-02-08 ENCOUNTER — Telehealth: Payer: Self-pay

## 2017-02-08 DIAGNOSIS — I25119 Atherosclerotic heart disease of native coronary artery with unspecified angina pectoris: Secondary | ICD-10-CM

## 2017-02-08 DIAGNOSIS — F03B18 Unspecified dementia, moderate, with other behavioral disturbance: Secondary | ICD-10-CM

## 2017-02-08 DIAGNOSIS — E1151 Type 2 diabetes mellitus with diabetic peripheral angiopathy without gangrene: Secondary | ICD-10-CM

## 2017-02-08 DIAGNOSIS — I10 Essential (primary) hypertension: Secondary | ICD-10-CM

## 2017-02-08 DIAGNOSIS — F0391 Unspecified dementia with behavioral disturbance: Secondary | ICD-10-CM

## 2017-02-08 DIAGNOSIS — Z8673 Personal history of transient ischemic attack (TIA), and cerebral infarction without residual deficits: Secondary | ICD-10-CM

## 2017-02-08 NOTE — Telephone Encounter (Signed)
Dionne Ano (daughter) called requesting referral back to [facility] for personal care services, [name] was told by insurance they will cover this.   Patient needs assistance with bathing, eating, walking and taking medications. (referral pending)   [name] prefers that [facility] send the same gentleman that was out before if possible.   Phone number for [facility] [phone]

## 2017-02-08 NOTE — Telephone Encounter (Signed)
[facility] for referral - order signed

## 2017-02-09 ENCOUNTER — Other Ambulatory Visit: Payer: Self-pay

## 2017-02-09 MED ORDER — SACCHAROMYCES BOULARDII 250 MG PO CAPS: 250.0000 mg | ORAL_CAPSULE | Freq: Two times a day (BID) | ORAL | 0 refills | Status: DC

## 2017-02-09 MED ORDER — LEVOFLOXACIN 500 MG PO TABS
500.0000 mg | ORAL_TABLET | Freq: Every day | ORAL | 0 refills | Status: DC
Start: 2017-02-09 — End: 2017-02-22

## 2017-02-09 NOTE — Telephone Encounter (Signed)
Spoke with [name] Patients daughter and informed her of meds being sent to pharmacy she expressed understanding.

## 2017-02-09 NOTE — Telephone Encounter (Signed)
Per Dr. Eulas Post patient has Pneumonia and would like for me to send in Avelox 400 mg #10 and Floraster 250 mg

## 2017-02-11 ENCOUNTER — Telehealth: Payer: Self-pay

## 2017-02-11 ENCOUNTER — Ambulatory Visit: Payer: Medicare HMO | Admitting: Internal Medicine

## 2017-02-11 NOTE — Telephone Encounter (Signed)
Telephone outreach to pt to discuss referral for community resources during recent OV. Patient's dtr (signed dpr on file) requested information on adult day care programs as well as activities for her father to do. Provided resources to daughter as well as contact information for further questions.   [name], B.A.  Care Guide

## 2017-02-14 DIAGNOSIS — E119 Type 2 diabetes mellitus without complications: Secondary | ICD-10-CM | POA: Diagnosis not present

## 2017-02-14 DIAGNOSIS — N183 Chronic kidney disease, stage 3 (moderate): Secondary | ICD-10-CM | POA: Diagnosis not present

## 2017-02-14 DIAGNOSIS — J181 Lobar pneumonia, unspecified organism: Secondary | ICD-10-CM | POA: Diagnosis not present

## 2017-02-14 DIAGNOSIS — I25119 Atherosclerotic heart disease of native coronary artery with unspecified angina pectoris: Secondary | ICD-10-CM | POA: Diagnosis not present

## 2017-02-14 DIAGNOSIS — M1990 Unspecified osteoarthritis, unspecified site: Secondary | ICD-10-CM | POA: Diagnosis not present

## 2017-02-14 DIAGNOSIS — E1151 Type 2 diabetes mellitus with diabetic peripheral angiopathy without gangrene: Secondary | ICD-10-CM | POA: Diagnosis not present

## 2017-02-14 DIAGNOSIS — F0391 Unspecified dementia with behavioral disturbance: Secondary | ICD-10-CM | POA: Diagnosis not present

## 2017-02-14 DIAGNOSIS — E1122 Type 2 diabetes mellitus with diabetic chronic kidney disease: Secondary | ICD-10-CM | POA: Diagnosis not present

## 2017-02-17 DIAGNOSIS — E1122 Type 2 diabetes mellitus with diabetic chronic kidney disease: Secondary | ICD-10-CM | POA: Diagnosis not present

## 2017-02-17 DIAGNOSIS — F0391 Unspecified dementia with behavioral disturbance: Secondary | ICD-10-CM | POA: Diagnosis not present

## 2017-02-17 DIAGNOSIS — I25119 Atherosclerotic heart disease of native coronary artery with unspecified angina pectoris: Secondary | ICD-10-CM | POA: Diagnosis not present

## 2017-02-17 DIAGNOSIS — E1151 Type 2 diabetes mellitus with diabetic peripheral angiopathy without gangrene: Secondary | ICD-10-CM | POA: Diagnosis not present

## 2017-02-17 DIAGNOSIS — E119 Type 2 diabetes mellitus without complications: Secondary | ICD-10-CM | POA: Diagnosis not present

## 2017-02-17 DIAGNOSIS — J181 Lobar pneumonia, unspecified organism: Secondary | ICD-10-CM | POA: Diagnosis not present

## 2017-02-18 DIAGNOSIS — E1122 Type 2 diabetes mellitus with diabetic chronic kidney disease: Secondary | ICD-10-CM | POA: Diagnosis not present

## 2017-02-18 DIAGNOSIS — E1151 Type 2 diabetes mellitus with diabetic peripheral angiopathy without gangrene: Secondary | ICD-10-CM | POA: Diagnosis not present

## 2017-02-18 DIAGNOSIS — F0391 Unspecified dementia with behavioral disturbance: Secondary | ICD-10-CM | POA: Diagnosis not present

## 2017-02-18 DIAGNOSIS — E119 Type 2 diabetes mellitus without complications: Secondary | ICD-10-CM | POA: Diagnosis not present

## 2017-02-18 DIAGNOSIS — J181 Lobar pneumonia, unspecified organism: Secondary | ICD-10-CM | POA: Diagnosis not present

## 2017-02-18 DIAGNOSIS — I25119 Atherosclerotic heart disease of native coronary artery with unspecified angina pectoris: Secondary | ICD-10-CM | POA: Diagnosis not present

## 2017-02-22 ENCOUNTER — Ambulatory Visit
Admission: RE | Admit: 2017-02-22 | Discharge: 2017-02-22 | Disposition: A | Discharge status: Home / Self Care | Payer: Medicare HMO | Source: Ambulatory Visit | Attending: Internal Medicine | Admitting: Internal Medicine

## 2017-02-22 ENCOUNTER — Encounter: Payer: Self-pay | Admitting: Internal Medicine

## 2017-02-22 ENCOUNTER — Other Ambulatory Visit: Payer: Self-pay

## 2017-02-22 ENCOUNTER — Telehealth: Payer: Self-pay

## 2017-02-22 ENCOUNTER — Ambulatory Visit (INDEPENDENT_AMBULATORY_CARE_PROVIDER_SITE_OTHER): Payer: Medicare HMO | Admitting: Internal Medicine

## 2017-02-22 VITALS — BP 138/70 | HR 83 | Temp 97.8°F | Ht 71.0 in | Wt 183.6 lb

## 2017-02-22 DIAGNOSIS — J181 Lobar pneumonia, unspecified organism: Principal | ICD-10-CM

## 2017-02-22 DIAGNOSIS — E1122 Type 2 diabetes mellitus with diabetic chronic kidney disease: Secondary | ICD-10-CM

## 2017-02-22 DIAGNOSIS — J189 Pneumonia, unspecified organism: Secondary | ICD-10-CM | POA: Diagnosis not present

## 2017-02-22 DIAGNOSIS — Z794 Long term (current) use of insulin: Secondary | ICD-10-CM

## 2017-02-22 DIAGNOSIS — N183 Chronic kidney disease, stage 3 (moderate): Secondary | ICD-10-CM | POA: Diagnosis not present

## 2017-02-22 DIAGNOSIS — F0391 Unspecified dementia with behavioral disturbance: Secondary | ICD-10-CM

## 2017-02-22 DIAGNOSIS — F03B18 Unspecified dementia, moderate, with other behavioral disturbance: Secondary | ICD-10-CM

## 2017-02-22 MED ORDER — ZOSTER VAC RECOMB ADJUVANTED 50 MCG/0.5ML IM SUSR: 0.5000 mL | Freq: Once | INTRAMUSCULAR | 1 refills | Status: AC

## 2017-02-22 MED ORDER — ALBUTEROL SULFATE HFA 108 (90 BASE) MCG/ACT IN AERS: INHALATION_SPRAY | RESPIRATORY_TRACT | 1 refills | Status: DC

## 2017-02-22 MED ORDER — SACCHAROMYCES BOULARDII 250 MG PO CAPS: 250.0000 mg | ORAL_CAPSULE | Freq: Two times a day (BID) | ORAL | 0 refills | Status: DC

## 2017-02-22 MED ORDER — LEVOFLOXACIN 500 MG PO TABS: 500.0000 mg | ORAL_TABLET | Freq: Every day | ORAL | 0 refills | Status: DC

## 2017-02-22 NOTE — Telephone Encounter (Signed)
Pneumonia improving but still present. Rx levaquin 500mg  daily #14 take 1 tab daily no RF; start albuterol HFA #1 inhale 2 puffs TID x 7 days then BID x 7 days then daily x 7 days and stop; RF x 2. Please ask pharmacy to include spacer for better compliance.

## 2017-02-22 NOTE — Patient Instructions (Addendum)
Will call with chest xray results  May take OTC plain mucinex 1200mg  2 times daily for congestion  May need repeat antibiotic +/- inhaler  Continue other medications as ordered  Follow up as scheduled next month

## 2017-02-22 NOTE — Telephone Encounter (Signed)
Patient's daughter called and states my father had his xray completed today and I need advisement. " My father is not going to die from Pneumonia."  [name] was informed that we will follow-up with her once Dr.Carter reviews and advises on report.  [name] has to be at work by 1:00 pm, if no answer please leave detailed message on voicemail.

## 2017-02-22 NOTE — Progress Notes (Signed)
Patient ID: [name], male   DOB: 04-26-1922, 83 y.o.   MRN: [id number]    Location:  [facility] Place of Service: OFFICE  Chief Complaint  Patient presents with  . Follow-up    2 week follow up on pneumonia    HPI:  83 yo male seen today for f/u LLL CAP. he was tx with 10 days levaquin 534m daily. He completed tx. He reports he still has a cough. Denies CP or SOB. No f/c. Appetite is excellent. No issues with sleeping. He fell in his bedroom recently. No known injury. Behavior improved but daughter still c/a lagging infection. BS improving - down to 129 yesterday (prev 190s). A1c >14%. He is a poor historian due to dementia. Hx obtained from chart    Past Medical History:  Diagnosis Date  . Arthritis   . CAD (coronary artery disease)    s/p DES to LAD 06/2013  . Depression   . Dyslipidemia   . Glaucoma   . Hyperglycemia 04/17/2014  . Hyperlipidemia   . Hypertension   . Stroke ([place]   . Type 2 diabetes mellitus ([place]     Past Surgical History:  Procedure Laterality Date  . CARDIAC CATHETERIZATION  06/2013   DES to LAD by Dr STamala Julian . Right Cataract      Patient Care Team: CGildardo Cranker DO as PCP - General (Internal Medicine) MHayden Pedro MD (Ophthalmology) SMacarthur Critchley [facility] Referring Physician (Optometry)  Social History   Social History  . Marital status: Widowed    Spouse name: N/A  . Number of children: 4  . Years of education: N/A   Occupational History  . Retired    Social History Main Topics  . Smoking status: Former Smoker    Types: Cigarettes    Quit date: 08/02/1950  . Smokeless tobacco: Never Used  . Alcohol use No  . Drug use: No  . Sexual activity: Not Currently   Other Topics Concern  . 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 His smoking use included Cigarettes. He has never used  smokeless tobacco. He reports that he does not drink alcohol or use drugs.  Family History  Problem Relation Age of Onset  . Arthritis Mother   . Breast cancer Mother   . Arthritis Father   . Dementia Father   . Diabetes Other   . Arthritis Other   . Colon cancer Other   . Stroke Other   . Diabetes type II Sister   . Suicidality Brother   . Diabetes type I Brother   . Diabetes type I Brother   . Diabetes type I Brother   . Diabetes type II Sister   . Breast cancer Daughter   . Emphysema Daughter    Family Status  Relation Status  . Mother Deceased  . Father Deceased  . Other (Not Specified)  . Sister Alive  . Brother Deceased  . Brother Alive  . Brother Alive  . Brother Deceased  . Sister Alive  . Daughter Deceased  . Daughter Alive  . Son Alive  . Son Alive  . Daughter Deceased     Allergies  Allergen Reactions  . Lisinopril Cough    Medications: Patient's Medications  New Prescriptions   No medications on file  Previous Medications   ALCOHOL SWABS (B-D SINGLE USE SWABS REGULAR) PADS    Use as directed with checking blood sugar three times daily. Dx: E11.51   AMLODIPINE (NORVASC) 5 MG TABLET    Take 1 tablet (5 mg total) by mouth daily.   ASPIRIN EC 81 MG TABLET    Take 1 tablet (81 mg total) by mouth daily.   BLOOD GLUCOSE CALIBRATION (ACCU-CHEK AVIVA) SOLN    Use as Directed. Dx: E11.51   BLOOD GLUCOSE MONITORING SUPPL (ACCU-CHEK AVIVA PLUS) W/DEVICE KIT    Use as directed with checking blood sugar three times daily. Dx: E11.51   ESCITALOPRAM (LEXAPRO) 5 MG TABLET    Take 1 tablet daily   FLUTICASONE (FLONASE) 50 MCG/ACT NASAL SPRAY    Place 1 spray into both nostrils daily.   GLUCOSE BLOOD (ACCU-CHEK AVIVA) TEST STRIP    Use to test blood sugar three times daily. Dx: E11.51   HYDROCHLOROTHIAZIDE (HYDRODIURIL) 25 MG TABLET    TAKE 1 TABLET EVERY DAY   INSULIN GLARGINE (TOUJEO SOLOSTAR) 300 UNIT/ML SOPN    Inject 35 Units into the skin at bedtime.    INSULIN PEN NEEDLE 31G X 5 MM MISC    Use as Directed. Dx: E11.51   LANCET DEVICES (ACCU-CHEK SOFTCLIX) LANCETS    Use as instructed with checking blood sugar three times daily. Dx: E11.51   LORATADINE 10 MG CAPS    Take 1 capsule by mouth daily.   LOSARTAN (COZAAR) 25 MG TABLET    Take 1 tablet (25 mg total) by mouth daily.   MULTIPLE VITAMINS-MINERALS (SENTRY SENIOR) TABS    Take 1 tablet by mouth daily.   NAMZARIC 28-10 MG CP24    TAKE 1 CAPSULE EVERY DAY   SACCHAROMYCES BOULARDII (FLORASTOR) 250 MG CAPSULE    Take 1 capsule (250 mg total) by mouth 2 (two) times daily.   SIMVASTATIN (ZOCOR) 20 MG TABLET    Take 1 tablet (20 mg total) by mouth daily.  Modified Medications   Modified Medication Previous Medication   ZOSTER VAC RECOMB ADJUVANTED (SHINGRIX) INJECTION Zoster Vac Recomb Adjuvanted (SHINGRIX) injection      Inject 0.5 mLs into the muscle once.    Inject 0.5 mLs into the muscle once.  Discontinued Medications   LEVOFLOXACIN (LEVAQUIN) 500 MG TABLET    Take 1 tablet (500 mg total) by mouth daily.    Review of Systems  Unable to perform ROS: Dementia    Vitals:   02/22/17 0857  BP: 138/70  Pulse: 83  Temp: 97.8 F (36.6 C)  TempSrc: Oral  SpO2: 93%  Weight: 183 lb 9.6 oz (83.3 kg)  Height: '5\' 11"'  (1.803 m)   Body mass index is 25.61 kg/m.  Physical Exam  Constitutional: He appears well-developed.  Frail appearing with min conversational dyspnea. No accessory muscle use  Cardiovascular: Normal rate, regular rhythm and intact distal pulses.  Exam reveals no gallop and no friction rub.   Murmur (1/6 SEM) heard. +1 pitting LE edema b/l. No calf TTP  Pulmonary/Chest: No respiratory distress. He has no wheezes. He has rales (left basilar inspiratory, wet). He exhibits no tenderness.  Musculoskeletal: He exhibits edema.  Neurological: He is alert.  Skin: Skin is warm and dry. No rash noted.  Psychiatric: He has a normal mood and affect. His behavior is normal.      Labs reviewed: Office Visit on 02/07/2017  Component Date Value Ref Range Status  . NT-Pro BNP 02/07/2017 194  0 -  486 pg/mL Final   Comment: The following cut-points have been suggested for the use of proBNP for the diagnostic evaluation of heart failure (HF) in patients with acute dyspnea: Modality                     Age           Optimal Cut                            (years)            Point ------------------------------------------------------ Diagnosis (rule in HF)        <50            450 pg/mL                           50 - 75            900 pg/mL                               >75           1800 pg/mL Exclusion (rule out HF)  Age independent     300 pg/mL   Appointment on 12/10/2016  Component Date Value Ref Range Status  . Sodium 12/10/2016 133* 135 - 146 mmol/L Final  . Potassium 12/10/2016 4.1  3.5 - 5.3 mmol/L Final  . Chloride 12/10/2016 93* 98 - 110 mmol/L Final  . CO2 12/10/2016 32* 20 - 31 mmol/L Final  . Glucose, Bld 12/10/2016 379* 65 - 99 mg/dL Final  . BUN 12/10/2016 17  7 - 25 mg/dL Final  . Creat 12/10/2016 1.48* 0.70 - 1.11 mg/dL Final   Comment:   For patients > or = 81 years of age: The upper reference limit for Creatinine is approximately 13% higher for people identified as African-American.     . Total Bilirubin 12/10/2016 0.4  0.2 - 1.2 mg/dL Final  . Alkaline Phosphatase 12/10/2016 60  40 - 115 U/L Final  . AST 12/10/2016 38* 10 - 35 U/L Final  . ALT 12/10/2016 38  9 - 46 U/L Final  . Total Protein 12/10/2016 6.7  6.1 - 8.1 g/dL Final  . Albumin 12/10/2016 4.2  3.6 - 5.1 g/dL Final  . Calcium 12/10/2016 9.6  8.6 - 10.3 mg/dL Final  . GFR, Est African American 12/10/2016 47* >=60 mL/min Final  . GFR, Est Non African American 12/10/2016 40* >=60 mL/min Final  . Cholesterol 12/10/2016 171  <200 mg/dL Final  . Triglycerides 12/10/2016 214* <150 mg/dL Final  . HDL 12/10/2016 34* >40 mg/dL Final  . Total CHOL/HDL Ratio 12/10/2016 5.0* <5.0  Ratio Final  . VLDL 12/10/2016 43* <30 mg/dL Final  . LDL Cholesterol 12/10/2016 94  <100 mg/dL Final  . Hgb A1c MFr Bld 12/10/2016 >14.0* <5.7 % Final   Comment: Verified by repeat analysis.   For someone without known diabetes, a hemoglobin A1c value of 6.5% or greater indicates that they may have diabetes and this should be confirmed with a follow-up test.   For someone with known diabetes, a value <7% indicates that their diabetes is well controlled and a value greater than or equal to 7% indicates suboptimal control. A1c targets should be individualized based on duration of diabetes, age, comorbid conditions, and other considerations.   Currently, no consensus exists  for use of hemoglobin A1c for diagnosis of diabetes for children.     . Mean Plasma Glucose 12/10/2016 SEE NOTE  mg/dL Final   Comment: eAG cannot be calculated. Hemoglobin A1c result exceeds the linearity of the assay.       Dg Chest 2 View  Result Date: 02/07/2017 CLINICAL DATA:  Shortness of breath and cough over the last week. EXAM: CHEST  2 VIEW COMPARISON:  09/22/2009 FINDINGS: Heart size is normal. Aortic atherosclerosis. There is left lower lobe pneumonia. No evidence of effusion. No significant bone finding. IMPRESSION: Left lower lobe pneumonia. Aortic atherosclerosis. Electronically Signed   By: Nelson Chimes M.D.   On: 02/07/2017 11:54     Assessment/Plan   ICD-10-CM   1. Community acquired pneumonia of left lower lobe of lung ([place]) J18.1 DG Chest 2 View   improving overall but still symptomatic  2. Type 2 diabetes mellitus with stage 3 chronic kidney disease, with long-term current use of insulin (HCC) E11.22    N18.3    Z79.4   3. Moderate dementia with behavioral disturbance F03.91      T/c 10 days levaquin 566m daily + HFA taper (TIDx7 days--BID--QD)  Hold off on shingrix vaccine until pneumonia cleared  Will call with chest xray results  May take OTC plain mucinex 12034m2 times  daily for congestion  May need repeat antibiotic +/- inhaler  Continue other medications as ordered  Follow up as scheduled next month   Lawrance Wiedemann S. CaPerlie GoldPiMacon County General Hospitalnd [facility] [address]rMontgomery[place] [phone] (Monday-Friday 8 AM - 5 PM) ([phone] 5 PM and follow prompts

## 2017-02-22 NOTE — Telephone Encounter (Signed)
Prescriptions were sent to pharmacy and a detail message was left for [facility].

## 2017-02-25 DIAGNOSIS — F0391 Unspecified dementia with behavioral disturbance: Secondary | ICD-10-CM | POA: Diagnosis not present

## 2017-02-25 DIAGNOSIS — J181 Lobar pneumonia, unspecified organism: Secondary | ICD-10-CM | POA: Diagnosis not present

## 2017-02-25 DIAGNOSIS — E1151 Type 2 diabetes mellitus with diabetic peripheral angiopathy without gangrene: Secondary | ICD-10-CM | POA: Diagnosis not present

## 2017-02-25 DIAGNOSIS — E119 Type 2 diabetes mellitus without complications: Secondary | ICD-10-CM | POA: Diagnosis not present

## 2017-02-25 DIAGNOSIS — I25119 Atherosclerotic heart disease of native coronary artery with unspecified angina pectoris: Secondary | ICD-10-CM | POA: Diagnosis not present

## 2017-02-25 DIAGNOSIS — E1122 Type 2 diabetes mellitus with diabetic chronic kidney disease: Secondary | ICD-10-CM | POA: Diagnosis not present

## 2017-03-01 DIAGNOSIS — J181 Lobar pneumonia, unspecified organism: Secondary | ICD-10-CM | POA: Diagnosis not present

## 2017-03-01 DIAGNOSIS — E1151 Type 2 diabetes mellitus with diabetic peripheral angiopathy without gangrene: Secondary | ICD-10-CM | POA: Diagnosis not present

## 2017-03-01 DIAGNOSIS — I25119 Atherosclerotic heart disease of native coronary artery with unspecified angina pectoris: Secondary | ICD-10-CM | POA: Diagnosis not present

## 2017-03-01 DIAGNOSIS — E1122 Type 2 diabetes mellitus with diabetic chronic kidney disease: Secondary | ICD-10-CM | POA: Diagnosis not present

## 2017-03-01 DIAGNOSIS — E119 Type 2 diabetes mellitus without complications: Secondary | ICD-10-CM | POA: Diagnosis not present

## 2017-03-01 DIAGNOSIS — F0391 Unspecified dementia with behavioral disturbance: Secondary | ICD-10-CM | POA: Diagnosis not present

## 2017-03-03 DIAGNOSIS — E1122 Type 2 diabetes mellitus with diabetic chronic kidney disease: Secondary | ICD-10-CM | POA: Diagnosis not present

## 2017-03-03 DIAGNOSIS — F0391 Unspecified dementia with behavioral disturbance: Secondary | ICD-10-CM | POA: Diagnosis not present

## 2017-03-03 DIAGNOSIS — I25119 Atherosclerotic heart disease of native coronary artery with unspecified angina pectoris: Secondary | ICD-10-CM | POA: Diagnosis not present

## 2017-03-03 DIAGNOSIS — E1151 Type 2 diabetes mellitus with diabetic peripheral angiopathy without gangrene: Secondary | ICD-10-CM | POA: Diagnosis not present

## 2017-03-03 DIAGNOSIS — E119 Type 2 diabetes mellitus without complications: Secondary | ICD-10-CM | POA: Diagnosis not present

## 2017-03-03 DIAGNOSIS — J181 Lobar pneumonia, unspecified organism: Secondary | ICD-10-CM | POA: Diagnosis not present

## 2017-03-08 DIAGNOSIS — I25119 Atherosclerotic heart disease of native coronary artery with unspecified angina pectoris: Secondary | ICD-10-CM | POA: Diagnosis not present

## 2017-03-08 DIAGNOSIS — E1151 Type 2 diabetes mellitus with diabetic peripheral angiopathy without gangrene: Secondary | ICD-10-CM | POA: Diagnosis not present

## 2017-03-08 DIAGNOSIS — F0391 Unspecified dementia with behavioral disturbance: Secondary | ICD-10-CM | POA: Diagnosis not present

## 2017-03-08 DIAGNOSIS — E119 Type 2 diabetes mellitus without complications: Secondary | ICD-10-CM | POA: Diagnosis not present

## 2017-03-08 DIAGNOSIS — E1122 Type 2 diabetes mellitus with diabetic chronic kidney disease: Secondary | ICD-10-CM | POA: Diagnosis not present

## 2017-03-08 DIAGNOSIS — J181 Lobar pneumonia, unspecified organism: Secondary | ICD-10-CM | POA: Diagnosis not present

## 2017-03-10 DIAGNOSIS — E1151 Type 2 diabetes mellitus with diabetic peripheral angiopathy without gangrene: Secondary | ICD-10-CM | POA: Diagnosis not present

## 2017-03-10 DIAGNOSIS — E1122 Type 2 diabetes mellitus with diabetic chronic kidney disease: Secondary | ICD-10-CM | POA: Diagnosis not present

## 2017-03-10 DIAGNOSIS — J181 Lobar pneumonia, unspecified organism: Secondary | ICD-10-CM | POA: Diagnosis not present

## 2017-03-10 DIAGNOSIS — F0391 Unspecified dementia with behavioral disturbance: Secondary | ICD-10-CM | POA: Diagnosis not present

## 2017-03-10 DIAGNOSIS — E119 Type 2 diabetes mellitus without complications: Secondary | ICD-10-CM | POA: Diagnosis not present

## 2017-03-10 DIAGNOSIS — I25119 Atherosclerotic heart disease of native coronary artery with unspecified angina pectoris: Secondary | ICD-10-CM | POA: Diagnosis not present

## 2017-03-16 DIAGNOSIS — J181 Lobar pneumonia, unspecified organism: Secondary | ICD-10-CM | POA: Diagnosis not present

## 2017-03-16 DIAGNOSIS — E119 Type 2 diabetes mellitus without complications: Secondary | ICD-10-CM | POA: Diagnosis not present

## 2017-03-16 DIAGNOSIS — E1122 Type 2 diabetes mellitus with diabetic chronic kidney disease: Secondary | ICD-10-CM | POA: Diagnosis not present

## 2017-03-16 DIAGNOSIS — I25119 Atherosclerotic heart disease of native coronary artery with unspecified angina pectoris: Secondary | ICD-10-CM | POA: Diagnosis not present

## 2017-03-16 DIAGNOSIS — F0391 Unspecified dementia with behavioral disturbance: Secondary | ICD-10-CM | POA: Diagnosis not present

## 2017-03-16 DIAGNOSIS — E1151 Type 2 diabetes mellitus with diabetic peripheral angiopathy without gangrene: Secondary | ICD-10-CM | POA: Diagnosis not present

## 2017-03-18 ENCOUNTER — Other Ambulatory Visit: Payer: Self-pay | Admitting: *Deleted

## 2017-03-18 DIAGNOSIS — F329 Major depressive disorder, single episode, unspecified: Secondary | ICD-10-CM

## 2017-03-18 DIAGNOSIS — F32A Depression, unspecified: Secondary | ICD-10-CM

## 2017-03-18 MED ORDER — ESCITALOPRAM OXALATE 5 MG PO TABS: ORAL_TABLET | ORAL | 3 refills | Status: DC

## 2017-03-18 MED ORDER — HYDROCHLOROTHIAZIDE 25 MG PO TABS: 25.0000 mg | ORAL_TABLET | Freq: Every day | ORAL | 3 refills | Status: DC

## 2017-03-18 NOTE — Telephone Encounter (Signed)
[facility] 

## 2017-03-21 ENCOUNTER — Other Ambulatory Visit: Payer: Self-pay | Admitting: *Deleted

## 2017-03-21 DIAGNOSIS — F329 Major depressive disorder, single episode, unspecified: Secondary | ICD-10-CM

## 2017-03-21 DIAGNOSIS — F32A Depression, unspecified: Secondary | ICD-10-CM

## 2017-03-21 MED ORDER — ESCITALOPRAM OXALATE 5 MG PO TABS: ORAL_TABLET | ORAL | 3 refills | Status: DC

## 2017-03-21 MED ORDER — HYDROCHLOROTHIAZIDE 25 MG PO TABS: 25.0000 mg | ORAL_TABLET | Freq: Every day | ORAL | 3 refills | Status: DC

## 2017-03-21 NOTE — Telephone Encounter (Signed)
[facility] 

## 2017-03-23 ENCOUNTER — Ambulatory Visit
Admission: RE | Admit: 2017-03-23 | Discharge: 2017-03-23 | Disposition: A | Discharge status: Home / Self Care | Payer: Medicare HMO | Source: Ambulatory Visit | Attending: Internal Medicine | Admitting: Internal Medicine

## 2017-03-23 ENCOUNTER — Ambulatory Visit (INDEPENDENT_AMBULATORY_CARE_PROVIDER_SITE_OTHER): Payer: Medicare HMO | Admitting: Nurse Practitioner

## 2017-03-23 ENCOUNTER — Encounter: Payer: Self-pay | Admitting: Nurse Practitioner

## 2017-03-23 VITALS — BP 126/70 | HR 81 | Temp 98.4°F | Ht 71.0 in | Wt 188.0 lb

## 2017-03-23 DIAGNOSIS — J181 Lobar pneumonia, unspecified organism: Principal | ICD-10-CM

## 2017-03-23 DIAGNOSIS — J189 Pneumonia, unspecified organism: Secondary | ICD-10-CM

## 2017-03-23 DIAGNOSIS — R05 Cough: Secondary | ICD-10-CM | POA: Diagnosis not present

## 2017-03-23 DIAGNOSIS — T148XXA Other injury of unspecified body region, initial encounter: Secondary | ICD-10-CM | POA: Diagnosis not present

## 2017-03-23 MED ORDER — DOXYCYCLINE HYCLATE 100 MG PO TABS: 100.0000 mg | ORAL_TABLET | Freq: Two times a day (BID) | ORAL | 0 refills | Status: DC

## 2017-03-23 NOTE — Patient Instructions (Addendum)
To get florastore by mouth twice daily for 10 days To start doxycycline 100 mg by mouth twice daily for leg wound- To do saline with Vaseline twice daily   To keep follow up with Dr Eulas Post next week To go to [facility] today to follow up chest xray   To cont mucinex by mouth twice daily for congestion

## 2017-03-23 NOTE — Progress Notes (Signed)
Careteam: Patient Care Team: Gildardo Cranker, DO as PCP - General (Internal Medicine) Hayden Pedro, MD (Ophthalmology) Macarthur Critchley, [place] as Referring Physician (Optometry)  Advanced Directive information    Allergies  Allergen Reactions  . Lisinopril Cough    Chief Complaint  Patient presents with  . Acute Visit    Fall about 2 weeks ago and injured right leg, here with daughter   . Medication Management    Discuss if patient to continue Mucinex   . Medication Refill    No refills needed   . Immunizations    Flu Vaccine to be given today      HPI: Patient is a 83 y.o. male seen in the office today due to cut on  leg 2 weeks ago. No other falls in the last 12 months. Unsure what happened.  Happened while caregiver was on vacation.  Daughter is using normal saline and vasaline to site. Unsure what it looked like before saw it 1 week ago and states she just now realized he had a fall.  No fevers that he is aware of. No drainage.  Left Leg is slightly more swollen llen than right Does not feel leg or wound. Denies pain.   Still coughing per daughter reports he is still wheezing at times.  No shortness of breath noted.  Review of Systems:  Review of Systems  Unable to perform ROS: Dementia    Past Medical History:  Diagnosis Date  . Arthritis   . CAD (coronary artery disease)    s/p DES to LAD 06/2013  . Depression   . Dyslipidemia   . Glaucoma   . Hyperglycemia 04/17/2014  . Hyperlipidemia   . Hypertension   . Stroke ([place])   . Type 2 diabetes mellitus ([place])    Past Surgical History:  Procedure Laterality Date  . CARDIAC CATHETERIZATION  06/2013   DES to LAD by Dr Tamala Julian  . Right Cataract     Social History:   reports that he quit smoking about 66 years ago. His smoking use included Cigarettes. He has never used smokeless tobacco. He reports that he does not drink alcohol or use drugs.  Family History  Problem Relation Age of Onset  . Arthritis Mother    . Breast cancer Mother   . Arthritis Father   . Dementia Father   . Diabetes Other   . Arthritis Other   . Colon cancer Other   . Stroke Other   . Diabetes type II Sister   . Suicidality Brother   . Diabetes type I Brother   . Diabetes type I Brother   . Diabetes type I Brother   . Diabetes type II Sister   . Breast cancer Daughter   . Emphysema Daughter     Medications: Patient's Medications  New Prescriptions   No medications on file  Previous Medications   ALBUTEROL (PROVENTIL HFA;VENTOLIN HFA) 108 (90 BASE) MCG/ACT INHALER    Use 3 times a day for 7 days, then 2 times a day for 7 days, then once daily for 7 days, then stop.   ALCOHOL SWABS (B-D SINGLE USE SWABS REGULAR) PADS    Use as directed with checking blood sugar three times daily. Dx: E11.51   AMLODIPINE (NORVASC) 5 MG TABLET    Take 1 tablet (5 mg total) by mouth daily.   ASPIRIN EC 81 MG TABLET    Take 1 tablet (81 mg total) by mouth daily.   BLOOD GLUCOSE  CALIBRATION (ACCU-CHEK AVIVA) SOLN    Use as Directed. Dx: E11.51   BLOOD GLUCOSE MONITORING SUPPL (ACCU-CHEK AVIVA PLUS) W/DEVICE KIT    Use as directed with checking blood sugar three times daily. Dx: E11.51   ESCITALOPRAM (LEXAPRO) 5 MG TABLET    Take one tablet by mouth once daily   FLUTICASONE (FLONASE) 50 MCG/ACT NASAL SPRAY    Place 1 spray into both nostrils daily.   GLUCOSE BLOOD (ACCU-CHEK AVIVA) TEST STRIP    Use to test blood sugar three times daily. Dx: E11.51   HYDROCHLOROTHIAZIDE (HYDRODIURIL) 25 MG TABLET    Take 1 tablet (25 mg total) by mouth daily.   INSULIN GLARGINE (TOUJEO SOLOSTAR) 300 UNIT/ML SOPN    Inject 35 Units into the skin at bedtime.   INSULIN PEN NEEDLE 31G X 5 MM MISC    Use as Directed. Dx: E11.51   LANCET DEVICES (ACCU-CHEK SOFTCLIX) LANCETS    Use as instructed with checking blood sugar three times daily. Dx: E11.51   LORATADINE 10 MG CAPS    Take 1 capsule by mouth daily.   LOSARTAN (COZAAR) 25 MG TABLET    Take 1 tablet (25 mg  total) by mouth daily.   MULTIPLE VITAMINS-MINERALS (SENTRY SENIOR) TABS    Take 1 tablet by mouth daily.   NAMZARIC 28-10 MG CP24    TAKE 1 CAPSULE EVERY DAY   SIMVASTATIN (ZOCOR) 20 MG TABLET    Take 1 tablet (20 mg total) by mouth daily.  Modified Medications   No medications on file  Discontinued Medications   LEVOFLOXACIN (LEVAQUIN) 500 MG TABLET    Take 1 tablet (500 mg total) by mouth daily.   SACCHAROMYCES BOULARDII (FLORASTOR) 250 MG CAPSULE    Take 1 capsule (250 mg total) by mouth 2 (two) times daily.     Physical Exam:  Vitals:   03/23/17 0833  BP: 126/70  Pulse: 81  Temp: 98.4 F (36.9 C)  TempSrc: Oral  SpO2: 94%  Weight: 188 lb (85.3 kg)  Height: '5\' 11"'  (1.803 m)   Body mass index is 26.22 kg/m.  Physical Exam  Constitutional: He appears well-developed.  Frail appearing, hoarse, does not speak much.   Cardiovascular: Normal rate, regular rhythm and intact distal pulses.  Exam reveals no gallop and no friction rub.   Murmur (1/6 SEM) heard. +2 pitting RLE edema and +1 LLE. No calf TTP  Pulmonary/Chest: Effort normal. No respiratory distress. He has no wheezes. He has rhonchi (to right and left lower lobe). He has no rales. He exhibits no tenderness.  Musculoskeletal: He exhibits edema.  Neurological: He is alert.  Skin: Skin is warm and dry.  Abrasion noted to right mid shin with partial scab noted. Erythema surrounding abrasion, no drainage, odor or pain   Psychiatric: He has a normal mood and affect. His behavior is normal.    Labs reviewed: Basic Metabolic Panel:  Recent Labs  04/02/16 0957 07/21/16 0840 12/10/16 0913  NA 133* 139 133*  K 4.6 4.0 4.1  CL 92* 102 93*  CO2 31 28 32*  GLUCOSE 421* 153* 379*  BUN '18 18 17  ' CREATININE 3.21* 1.14* 1.48*  CALCIUM 10.1 9.6 9.6  TSH 2.12  --   --    Liver Function Tests:  Recent Labs  04/02/16 0957 07/21/16 0840 12/10/16 0913  AST 32 52* 38*  ALT 24 43 38  ALKPHOS 53 45 60  BILITOT 0.7  0.4 0.4  PROT 6.6 6.5 6.7  ALBUMIN 4.3 4.1 4.2   No results for input(s): LIPASE, AMYLASE in the last 8760 hours. No results for input(s): AMMONIA in the last 8760 hours. CBC:  Recent Labs  04/02/16 0957  WBC 4.4  NEUTROABS 2,068  HGB 13.0*  HCT 39.0  MCV 92.2  PLT 170   Lipid Panel:  Recent Labs  04/02/16 0957 07/21/16 0840 12/10/16 0913  CHOL 205* 154 171  HDL 50 41 34*  LDLCALC 127 92 94  TRIG 139 106 214*  CHOLHDL 4.1 3.8 5.0*   TSH:  Recent Labs  04/02/16 0957  TSH 2.12   A1C: Lab Results  Component Value Date   HGBA1C >14.0 (H) 12/10/2016     Assessment/Plan 1. Skin abrasion To start doxycycline 100 mg by mouth twice daily for leg wound- due to surrounding erythema will go ahead and treat for infection.  To get florastore by mouth twice daily for 10 days To increase saline with Vaseline twice daily    2. Community acquired pneumonia of left lower lobe of lung ([place]) Completed 2 rounds of antibiotics, will get chest xray today conts to have cough with congestion- to cont muciniex by mouth twice daily   Next appt: next week as scheduled.  Carlos American. Harle Battiest  [facility] [phone] 8 am - 5 pm) [phone] (after hours)

## 2017-03-24 ENCOUNTER — Other Ambulatory Visit: Payer: Self-pay

## 2017-03-24 DIAGNOSIS — J984 Other disorders of lung: Secondary | ICD-10-CM

## 2017-03-24 DIAGNOSIS — R9389 Abnormal findings on diagnostic imaging of other specified body structures: Secondary | ICD-10-CM

## 2017-03-25 DIAGNOSIS — I25119 Atherosclerotic heart disease of native coronary artery with unspecified angina pectoris: Secondary | ICD-10-CM | POA: Diagnosis not present

## 2017-03-25 DIAGNOSIS — E119 Type 2 diabetes mellitus without complications: Secondary | ICD-10-CM | POA: Diagnosis not present

## 2017-03-25 DIAGNOSIS — J181 Lobar pneumonia, unspecified organism: Secondary | ICD-10-CM | POA: Diagnosis not present

## 2017-03-25 DIAGNOSIS — E1122 Type 2 diabetes mellitus with diabetic chronic kidney disease: Secondary | ICD-10-CM | POA: Diagnosis not present

## 2017-03-25 DIAGNOSIS — E1151 Type 2 diabetes mellitus with diabetic peripheral angiopathy without gangrene: Secondary | ICD-10-CM | POA: Diagnosis not present

## 2017-03-25 DIAGNOSIS — F0391 Unspecified dementia with behavioral disturbance: Secondary | ICD-10-CM | POA: Diagnosis not present

## 2017-03-29 ENCOUNTER — Other Ambulatory Visit: Payer: Medicare HMO

## 2017-03-29 ENCOUNTER — Telehealth: Payer: Self-pay

## 2017-03-29 DIAGNOSIS — N183 Chronic kidney disease, stage 3 unspecified: Secondary | ICD-10-CM

## 2017-03-29 DIAGNOSIS — Z794 Long term (current) use of insulin: Secondary | ICD-10-CM | POA: Diagnosis not present

## 2017-03-29 DIAGNOSIS — E1122 Type 2 diabetes mellitus with diabetic chronic kidney disease: Secondary | ICD-10-CM | POA: Diagnosis not present

## 2017-03-29 DIAGNOSIS — E782 Mixed hyperlipidemia: Secondary | ICD-10-CM

## 2017-03-29 NOTE — Telephone Encounter (Signed)
Insurance call number [phone]  Patient's CT Chest w/o contrast is requiring clinical information that I was unable to answer. I was told by receptionist asking questions that if I could not answer questions at the time of call authorization would be cancelled and we would have to restart.  Dr.Carter will need to call to answer questions for approval determination of procedure.   CPT code [phone] Member ID number: [id number]  Pending appointment tomorrow @ 8:30 am

## 2017-03-29 NOTE — Telephone Encounter (Signed)
Left message on voicemail informing patient unable to get procedure approved by insurance, the provider will contact [facility] tomorrow for approval. Appointment to be cancelled until further notice.   Dorothy called [employer], left message informing them procedure not authorized.

## 2017-03-30 ENCOUNTER — Telehealth: Payer: Self-pay

## 2017-03-30 ENCOUNTER — Other Ambulatory Visit: Payer: Medicare HMO

## 2017-03-30 ENCOUNTER — Encounter: Payer: Self-pay | Admitting: Nurse Practitioner

## 2017-03-30 LAB — LIPID PANEL
CHOL/HDL RATIO: 3.6 ratio (ref <5.0)
CHOLESTEROL: 171 mg/dL (ref <200)
HDL: 47 mg/dL (ref >40)
LDL Cholesterol: 90 mg/dL (ref <100)
TRIGLYCERIDES: 170 mg/dL — AB (ref <150)
VLDL: 34 mg/dL — ABNORMAL HIGH (ref <30)

## 2017-03-30 LAB — COMPLETE METABOLIC PANEL WITH GFR
ALBUMIN: 4.5 g/dL (ref 3.6–5.1)
ALK PHOS: 52 U/L (ref 40–115)
ALT: 25 U/L (ref 9–46)
AST: 28 U/L (ref 10–35)
BUN: 16 mg/dL (ref 7–25)
CALCIUM: 10.3 mg/dL (ref 8.6–10.3)
CO2: 27 mmol/L (ref 20–32)
Chloride: 98 mmol/L (ref 98–110)
Creat: 1.17 mg/dL — ABNORMAL HIGH (ref 0.70–1.11)
GFR, EST NON AFRICAN AMERICAN: 54 mL/min — AB (ref >=60)
GFR, Est African American: 62 mL/min (ref >=60)
Glucose, Bld: 185 mg/dL — ABNORMAL HIGH (ref 65–99)
POTASSIUM: 4.4 mmol/L (ref 3.5–5.3)
Sodium: 139 mmol/L (ref 135–146)
Total Bilirubin: 0.6 mg/dL (ref 0.2–1.2)
Total Protein: 6.9 g/dL (ref 6.1–8.1)

## 2017-03-30 LAB — HEMOGLOBIN A1C
Hgb A1c MFr Bld: 9.7 % — ABNORMAL HIGH (ref <5.7)
Mean Plasma Glucose: 232 mg/dL

## 2017-03-30 MED ORDER — INSULIN GLARGINE 300 UNIT/ML ~~LOC~~ SOPN
45.0000 [IU] | PEN_INJECTOR | Freq: Every day | SUBCUTANEOUS | 2 refills | Status: DC
Start: 2017-03-30 — End: 2017-07-04

## 2017-03-30 NOTE — Telephone Encounter (Signed)
Prior authorization for CT has been approved.   Approval number: [id number]  Patient's daughter, [name], was notified as well as Designer, industrial/product.

## 2017-03-30 NOTE — Telephone Encounter (Signed)
Discussed results with Ursula Beath verbalized understanding of results, rx sent to [facility] as requested with increase dosage instructions    Copy of labs mailed- addressed verified

## 2017-03-30 NOTE — Telephone Encounter (Signed)
I spoke with a [employer] Rep to initiate authorization for CT chest wo contrast. After speaking with Sherrie Mustache, NP, rep stated that clinical forms would be faxed to office. Janett Billow must complete these forms and fax back before a final determination can be made about approval of CT.

## 2017-03-30 NOTE — Telephone Encounter (Signed)
Clinical forms were completed by Janett Billow. Forms were faxed along with last two office notes and last three chest xray reports to [phone].

## 2017-03-30 NOTE — Telephone Encounter (Signed)
-----   Message from [place], [place] sent at 03/30/2017 12:19 PM EDT ----- DM still uncontrolled but improving - increase Toujeo to 40 units at bedtime; cholesterol improving; kidney function abnormal but improved; other labs stable; follow up as scheduled

## 2017-03-30 NOTE — Telephone Encounter (Signed)
Clinical forms were received for [name] to complete. Forms were given to [facility] to complete.

## 2017-03-30 NOTE — Telephone Encounter (Signed)
I spoke with patient's daughter, [name], to let her know that we are still waiting on a determination from [facility] as to whether CT will be approved. I let [name] know that she would be called as soon as we find out a decision. She verbalized understanding.

## 2017-03-30 NOTE — Telephone Encounter (Signed)
Patient's daughter called upset indicating that her father will be finding a new provider. (I removed Dr.Carter's name as PCP).  Patient's daughter was upset because CT was cancelled and she did not receive a call. I tried to inform [name] that I tried my best to get CT approved and I left message yesterday at 5:00 pm  informing them that appointment to be cancelled until we can get insurance to provide approval code.  I also informed [name] that [place] indicated they would contact patient as well if they did not get an approval code from [facility] by 4 pm.   Earlie Server also called [facility] about 4:50 pm as a courtesy to inform them we were unable to get approved and would try again today.  [name] stated she is finding a new doctor and asked to speak with management.   Message forwarded to [facility]

## 2017-04-01 ENCOUNTER — Ambulatory Visit (INDEPENDENT_AMBULATORY_CARE_PROVIDER_SITE_OTHER): Payer: Medicare HMO | Admitting: Internal Medicine

## 2017-04-01 ENCOUNTER — Encounter: Payer: Self-pay | Admitting: Internal Medicine

## 2017-04-01 ENCOUNTER — Telehealth: Payer: Self-pay

## 2017-04-01 VITALS — BP 140/62 | HR 74 | Temp 98.1°F | Ht 71.0 in | Wt 189.8 lb

## 2017-04-01 DIAGNOSIS — R296 Repeated falls: Secondary | ICD-10-CM | POA: Diagnosis not present

## 2017-04-01 DIAGNOSIS — E1122 Type 2 diabetes mellitus with diabetic chronic kidney disease: Secondary | ICD-10-CM | POA: Diagnosis not present

## 2017-04-01 DIAGNOSIS — R4789 Other speech disturbances: Secondary | ICD-10-CM

## 2017-04-01 DIAGNOSIS — I10 Essential (primary) hypertension: Secondary | ICD-10-CM

## 2017-04-01 DIAGNOSIS — R2681 Unsteadiness on feet: Secondary | ICD-10-CM | POA: Diagnosis not present

## 2017-04-01 DIAGNOSIS — Z23 Encounter for immunization: Secondary | ICD-10-CM | POA: Diagnosis not present

## 2017-04-01 DIAGNOSIS — F0391 Unspecified dementia with behavioral disturbance: Secondary | ICD-10-CM

## 2017-04-01 DIAGNOSIS — Z8673 Personal history of transient ischemic attack (TIA), and cerebral infarction without residual deficits: Secondary | ICD-10-CM | POA: Diagnosis not present

## 2017-04-01 DIAGNOSIS — R471 Dysarthria and anarthria: Secondary | ICD-10-CM

## 2017-04-01 DIAGNOSIS — Z87891 Personal history of nicotine dependence: Secondary | ICD-10-CM

## 2017-04-01 DIAGNOSIS — F03B18 Unspecified dementia, moderate, with other behavioral disturbance: Secondary | ICD-10-CM

## 2017-04-01 DIAGNOSIS — R404 Transient alteration of awareness: Secondary | ICD-10-CM

## 2017-04-01 DIAGNOSIS — R918 Other nonspecific abnormal finding of lung field: Secondary | ICD-10-CM

## 2017-04-01 DIAGNOSIS — N183 Chronic kidney disease, stage 3 unspecified: Secondary | ICD-10-CM

## 2017-04-01 DIAGNOSIS — T148XXA Other injury of unspecified body region, initial encounter: Secondary | ICD-10-CM | POA: Diagnosis not present

## 2017-04-01 DIAGNOSIS — Z794 Long term (current) use of insulin: Secondary | ICD-10-CM

## 2017-04-01 MED ORDER — AMLODIPINE BESYLATE 5 MG PO TABS: 5.0000 mg | ORAL_TABLET | Freq: Every day | ORAL | 1 refills | Status: DC

## 2017-04-01 MED ORDER — FLUTICASONE PROPIONATE 50 MCG/ACT NA SUSP: 1.0000 | Freq: Every day | NASAL | 1 refills | Status: DC

## 2017-04-01 MED ORDER — MEMANTINE HCL-DONEPEZIL HCL ER 28-10 MG PO CP24: 1.0000 | ORAL_CAPSULE | Freq: Every day | ORAL | 1 refills | Status: DC

## 2017-04-01 MED ORDER — GLUCOSE BLOOD VI STRP: ORAL_STRIP | 3 refills | Status: DC

## 2017-04-01 MED ORDER — ACCU-CHEK SOFTCLIX LANCET DEV MISC: 3 refills | Status: DC

## 2017-04-01 MED ORDER — SIMVASTATIN 20 MG PO TABS: 20.0000 mg | ORAL_TABLET | Freq: Every day | ORAL | 1 refills | Status: DC

## 2017-04-01 MED ORDER — INSULIN PEN NEEDLE 31G X 5 MM MISC: 3 refills | Status: DC

## 2017-04-01 MED ORDER — LOSARTAN POTASSIUM 25 MG PO TABS: 25.0000 mg | ORAL_TABLET | Freq: Every day | ORAL | 1 refills | Status: DC

## 2017-04-01 NOTE — Telephone Encounter (Signed)
I called [employer] to initiate a prior authorization for head CT without contrast.   Approval number: [id number]  I called [facility] to get head CT scheduled for same day as chest CT.  Both the head and chest CT are scheduled for 04/06/17.   I left a message on [facility] mobile number explaining that both tests would be done on 04/06/17.

## 2017-04-01 NOTE — Progress Notes (Signed)
Patient ID: [name], male   DOB: 27-May-1922, 83 y.o.   MRN: [id number]    Location:  [facility] Place of Service: OFFICE  Chief Complaint  Patient presents with  . Medical Management of Chronic Issues    3 month follow-up on dementia and DM, examine right leg injury (related to fall)   . Immunizations    Completed ABX yesterday ? if ok to get flu vaccine   . Medication Refill    Refill 8 medications (send to [facility])   . Medication Management    Discuss if patient should d/c proventil     HPI:  83 yo male seen today for f/u. Pt fell of a self-made ladder some time during the week of August 5 -12th and sustained right anterior leg abrasion. Fall unwitnessed and not sure if head trauma occurred. Daughter was out-of-town and was not aware of incident until she saw his leg on August 14th. Abrasion appeared red but no d/c at the time. Daughter applied topical abx and vasoline. C/a change in speech - it has become garbled since the beginning of August. No cough. No CP. No SOB. He c/o RLE weakness. Family noticed poor posture at times and walks "slumped forward" occasionally. He completed abx tx. He uses cane to ambulate. Weight up 6 lbs since last month. He will not wear hearing aids. He is a poor historian due to dementia. Hx obtained from chart and daughter.  DM - A1c 9.7% (prev >14%). Toujeo increased to 40 units in last few days. Off metformin due to worsening renal fxn. BS at home 150 - 200, occasionally 200-250. No low BS reactions. No numbness/tingling in hands/feet. Urine microalbumin/cr ratio 33. He gets a cough with lisinopril. Currently taking losartan  HTN/CAD - s/p DES in 2014. BP stable on losartan, amlodipine and HCTZ. Takes ASA daily. Followed by cardio Dr Meda Coffee. LDL 94. 2D echo in Feb 2017 showed EF 65-70% and no wall motion abnormalities but does have grade 1 DD;mild-mod AI; mild MR  Dementia with behavioral disturbance - takes namzaric but he has. He takes lexapro for depression.  Albumin 4.5. Weight up 6lbs. MMSE 11/30. Followed by neurology Dr Delice Lesch  Hyperlipidemia - stable on simvastatin. LDL 90; HDL 47; TG 170  Depression - mood stable on lexapro  Glaucoma - followed by eye specialist at [facility]. Currently not on meds. No change in vision  Arthritis - joint pain stable. No worsening swelling  CKD - stage 3. Cr 1.17  Hx CVA - he has new garbled speech and unsteady gait. He has been falling more. He takes ASA daily  Seasonal allergy - stable on claritin    Past Medical History:  Diagnosis Date  . Arthritis   . CAD (coronary artery disease)    s/p DES to LAD 06/2013  . Depression   . Dyslipidemia   . Glaucoma   . Hyperglycemia 04/17/2014  . Hyperlipidemia   . Hypertension   . Stroke ([place])   . Type 2 diabetes mellitus ([place])     Past Surgical History:  Procedure Laterality Date  . CARDIAC CATHETERIZATION  06/2013   DES to LAD by Dr Tamala Julian  . Right Cataract      Patient Care Team: Gildardo Cranker, DO as PCP - General (Internal Medicine) Hayden Pedro, MD (Ophthalmology) Macarthur Critchley, [place] as Referring Physician (Optometry)  Social History   Social History  . Marital status: Widowed    Spouse name: N/A  . Number of children:  4  . Years of education: N/A   Occupational History  . Retired    Social History Main Topics  . Smoking status: Former Smoker    Types: Cigarettes    Quit date: 08/02/1950  . Smokeless tobacco: Never Used  . Alcohol use No  . Drug use: No  . Sexual activity: Not Currently   Other Topics Concern  . Not on file   Social History Narrative   Retired Warden/ranger   widowed, lives with dtr and youngest son   5 kids from first marriage     reports that he quit smoking about 31 years ago. His smoking use included Cigarettes. He has never used smokeless tobacco. He reports that he does not drink alcohol or use drugs.  Family History  Problem Relation Age of Onset  . Arthritis Mother   . Breast  cancer Mother   . Arthritis Father   . Dementia Father   . Diabetes Other   . Arthritis Other   . Colon cancer Other   . Stroke Other   . Diabetes type II Sister   . Suicidality Brother   . Diabetes type I Brother   . Diabetes type I Brother   . Diabetes type I Brother   . Diabetes type II Sister   . Breast cancer Daughter   . Emphysema Daughter    Family Status  Relation Status  . Mother Deceased  . Father Deceased  . Other (Not Specified)  . Sister Alive  . Brother Deceased  . Brother Alive  . Brother Alive  . Brother Deceased  . Sister Alive  . Daughter Deceased  . Daughter Alive  . Son Alive  . Son Alive  . Daughter Deceased     Allergies  Allergen Reactions  . Lisinopril Cough    Medications: Patient's Medications  New Prescriptions   No medications on file  Previous Medications   ALBUTEROL (PROVENTIL HFA;VENTOLIN HFA) 108 (90 BASE) MCG/ACT INHALER    Use 3 times a day for 7 days, then 2 times a day for 7 days, then once daily for 7 days, then stop.   ALCOHOL SWABS (B-D SINGLE USE SWABS REGULAR) PADS    Use as directed with checking blood sugar three times daily. Dx: E11.51   AMLODIPINE (NORVASC) 5 MG TABLET    Take 1 tablet (5 mg total) by mouth daily.   ASPIRIN EC 81 MG TABLET    Take 1 tablet (81 mg total) by mouth daily.   BLOOD GLUCOSE CALIBRATION (ACCU-CHEK AVIVA) SOLN    Use as Directed. Dx: E11.51   BLOOD GLUCOSE MONITORING SUPPL (ACCU-CHEK AVIVA PLUS) W/DEVICE KIT    Use as directed with checking blood sugar three times daily. Dx: E11.51   ESCITALOPRAM (LEXAPRO) 5 MG TABLET    Take one tablet by mouth once daily   FLUTICASONE (FLONASE) 50 MCG/ACT NASAL SPRAY    Place 1 spray into both nostrils daily.   GLUCOSE BLOOD (ACCU-CHEK AVIVA) TEST STRIP    Use to test blood sugar three times daily. Dx: E11.51   HYDROCHLOROTHIAZIDE (HYDRODIURIL) 25 MG TABLET    Take 1 tablet (25 mg total) by mouth daily.   INSULIN GLARGINE (TOUJEO SOLOSTAR) 300 UNIT/ML SOPN     Inject 45 Units into the skin at bedtime.   INSULIN PEN NEEDLE 31G X 5 MM MISC    Use as Directed. Dx: E11.51   LANCET DEVICES (ACCU-CHEK SOFTCLIX) LANCETS    Use as instructed with checking  blood sugar three times daily. Dx: E11.51   LORATADINE 10 MG CAPS    Take 1 capsule by mouth daily.   LOSARTAN (COZAAR) 25 MG TABLET    Take 1 tablet (25 mg total) by mouth daily.   MULTIPLE VITAMINS-MINERALS (SENTRY SENIOR) TABS    Take 1 tablet by mouth daily.   NAMZARIC 28-10 MG CP24    TAKE 1 CAPSULE EVERY DAY   SIMVASTATIN (ZOCOR) 20 MG TABLET    Take 1 tablet (20 mg total) by mouth daily.  Modified Medications   No medications on file  Discontinued Medications   DOXYCYCLINE (VIBRA-TABS) 100 MG TABLET    Take 1 tablet (100 mg total) by mouth 2 (two) times daily.    Review of Systems  Unable to perform ROS: Dementia (memory loss/confusion)    Vitals:   04/01/17 0811  BP: 140/62  Pulse: 74  Temp: 98.1 F (36.7 C)  TempSrc: Oral  SpO2: 96%  Weight: 189 lb 12.8 oz (86.1 kg)  Height: _0  (1.803 m)   Body mass index is 26.47 kg/m.  Physical Exam  Constitutional: He appears well-developed and well-nourished.  HENT:  Mouth/Throat: Oropharynx is clear and moist.  MMM; no oral thrush  Eyes: Pupils are equal, round, and reactive to light. No scleral icterus.  Neck: Neck supple. Carotid bruit is not present. No thyromegaly present.  Cardiovascular: Normal rate, regular rhythm and intact distal pulses.  Exam reveals no gallop and no friction rub.   Murmur (2/6 SEM) heard. +1 pitting BLE edema. No calf TTP  Pulmonary/Chest: Effort normal and breath sounds normal. He has no wheezes. He has no rales. He exhibits no tenderness.  Abdominal: Soft. Normal appearance and bowel sounds are normal. He exhibits distension. He exhibits no abdominal bruit, no pulsatile midline mass and no mass. There is no hepatomegaly. There is no tenderness. There is no rigidity, no rebound and no guarding. No hernia.   Musculoskeletal: He exhibits edema. He exhibits no tenderness.  Lymphadenopathy:    He has no cervical adenopathy.  Neurological: He is alert. He has normal reflexes. He displays no tremor. Gait (unsteady) abnormal.  Strength intact  Skin: Skin is warm and dry. No rash (multiple excoriations) noted.     Psychiatric: He has a normal mood and affect. His behavior is normal. Judgment and thought content normal.     Labs reviewed: Appointment on 03/29/2017  Component Date Value Ref Range Status  . Sodium 03/29/2017 139  135 - 146 mmol/L Final  . Potassium 03/29/2017 4.4  3.5 - 5.3 mmol/L Final  . Chloride 03/29/2017 98  98 - 110 mmol/L Final  . CO2 03/29/2017 27  20 - 32 mmol/L Final   Comment: ** Please note change in reference range(s). **     . Glucose, Bld 03/29/2017 185* 65 - 99 mg/dL Final  . BUN 03/29/2017 16  7 - 25 mg/dL Final  . Creat 03/29/2017 1.17* 0.70 - 1.11 mg/dL Final   Comment:   For patients > or = 81 years of age: The upper reference limit for Creatinine is approximately 13% higher for people identified as African-American.     . Total Bilirubin 03/29/2017 0.6  0.2 - 1.2 mg/dL Final  . Alkaline Phosphatase 03/29/2017 52  40 - 115 U/L Final  . AST 03/29/2017 28  10 - 35 U/L Final  . ALT 03/29/2017 25  9 - 46 U/L Final  . Total Protein 03/29/2017 6.9  6.1 - 8.1 g/dL Final  .  Albumin 03/29/2017 4.5  3.6 - 5.1 g/dL Final  . Calcium 03/29/2017 10.3  8.6 - 10.3 mg/dL Final  . GFR, Est African American 03/29/2017 62  >=60 mL/min Final  . GFR, Est Non African American 03/29/2017 54* >=60 mL/min Final  . Cholesterol 03/29/2017 171  <200 mg/dL Final  . Triglycerides 03/29/2017 170* <150 mg/dL Final  . HDL 03/29/2017 47  >40 mg/dL Final  . Total CHOL/HDL Ratio 03/29/2017 3.6  <5.0 Ratio Final  . VLDL 03/29/2017 34* <30 mg/dL Final  . LDL Cholesterol 03/29/2017 90  <100 mg/dL Final  . Hgb A1c MFr Bld 03/29/2017 9.7* <5.7 % Final   Comment:   For someone without  known diabetes, a hemoglobin A1c value of 6.5% or greater indicates that they may have diabetes and this should be confirmed with a follow-up test.   For someone with known diabetes, a value <7% indicates that their diabetes is well controlled and a value greater than or equal to 7% indicates suboptimal control. A1c targets should be individualized based on duration of diabetes, age, comorbid conditions, and other considerations.   Currently, no consensus exists for use of hemoglobin A1c for diagnosis of diabetes for children.     . Mean Plasma Glucose 03/29/2017 232  mg/dL Final  Office Visit on 02/07/2017  Component Date Value Ref Range Status  . NT-Pro BNP 02/07/2017 194  0 - 486 pg/mL Final   Comment: The following cut-points have been suggested for the use of proBNP for the diagnostic evaluation of heart failure (HF) in patients with acute dyspnea: Modality                     Age           Optimal Cut                            (years)            Point ------------------------------------------------------ Diagnosis (rule in HF)        <50            450 pg/mL                           50 - 75            900 pg/mL                               >75           1800 pg/mL Exclusion (rule out HF)  Age independent     300 pg/mL     Dg Chest 2 View  Result Date: 03/23/2017 CLINICAL DATA:  Recent episode of pneumonia. Persistent cough. Assess for radiographic improvement. EXAM: CHEST  2 VIEW COMPARISON:  Chest x-rays of February 22, 2017 and February 07, 2017. FINDINGS: The right lung is well-expanded on and clear. On the left the hemidiaphragm remains mildly elevated. There is persistent increased density in the left lower lobe perhaps slightly improved since July 9th but not significantly changed since July 24th. The heart and pulmonary vascularity are normal. There is calcification in the wall of the aortic arch. IMPRESSION: Persistent increased density in the left lower lobe. Given the lack  of complete clearing over the past nearly 6 weeks, chest CT scanning is recommended. Electronically Signed   By: Shanon Brow  [place] M.D.   On: 03/23/2017 10:15     Assessment/Plan   ICD-10-CM   1. X-ray of lung, abnormal R91.8   2. Transient alteration of awareness R40.4 CT Head Wo Contrast  3. Garbled speech R47.89   4. Frequent falls R29.6 Ambulatory referral to Home Health  5. Moderate dementia with behavioral disturbance F03.91 Memantine HCl-Donepezil HCl (NAMZARIC) 28-10 MG CP24    CMP with eGFR  6. Skin abrasion T14.8XXA   7. Type 2 diabetes mellitus with stage 3 chronic kidney disease, with long-term current use of insulin (HCC) E11.22 glucose blood (ACCU-CHEK AVIVA) test strip   N18.3 Insulin Pen Needle 31G X 5 MM MISC   Z79.4 Lancet Devices (ACCU-CHEK SOFTCLIX) lancets    CMP with eGFR    Lipid Panel    Hemoglobin A1c  8. Essential hypertension I10 amLODipine (NORVASC) 5 MG tablet    losartan (COZAAR) 25 MG tablet  9. History of stroke Z86.73   10. History of tobacco abuse Z87.891   11. Unsteady gait R26.81 Ambulatory referral to Home Health  12. Dysarthria R47.1 CT Head Wo Contrast    CMP with eGFR  13. Need for immunization against influenza Z23 Flu Vaccine QUAD 36+ mos IM   WILL CALL WITH CT HEAD APPT  Follow up for CT chest as scheduled next week  Continue current medications as ordered  Continue local care to skin abrasion  Follow up in 3 mos for DM, HTN, dementia. Fasting labs prior to appt    Newell S. Perlie Gold  [facility] [address] [place], [place] [address] [phone] Cell (Monday-Friday 8 AM - 5 PM) [phone] After 5 PM and follow prompts

## 2017-04-01 NOTE — Patient Instructions (Signed)
WILL CALL WITH CT HEAD APPT  Follow up for CT chest as scheduled next week  Continue current medications as ordered  Continue local care to skin abrasion  Follow up in 3 mos for DM, HTN, dementia. Fasting labs prior to appt

## 2017-04-06 ENCOUNTER — Other Ambulatory Visit: Payer: Self-pay | Admitting: Internal Medicine

## 2017-04-06 ENCOUNTER — Ambulatory Visit
Admission: RE | Admit: 2017-04-06 | Discharge: 2017-04-06 | Disposition: A | Discharge status: Home / Self Care | Payer: Medicare HMO | Source: Ambulatory Visit | Attending: Internal Medicine | Admitting: Internal Medicine

## 2017-04-06 DIAGNOSIS — S299XXA Unspecified injury of thorax, initial encounter: Secondary | ICD-10-CM | POA: Diagnosis not present

## 2017-04-06 DIAGNOSIS — N183 Chronic kidney disease, stage 3 (moderate): Principal | ICD-10-CM

## 2017-04-06 DIAGNOSIS — R9389 Abnormal findings on diagnostic imaging of other specified body structures: Secondary | ICD-10-CM

## 2017-04-06 DIAGNOSIS — F0391 Unspecified dementia with behavioral disturbance: Secondary | ICD-10-CM

## 2017-04-06 DIAGNOSIS — S065X0A Traumatic subdural hemorrhage without loss of consciousness, initial encounter: Secondary | ICD-10-CM | POA: Diagnosis not present

## 2017-04-06 DIAGNOSIS — E1122 Type 2 diabetes mellitus with diabetic chronic kidney disease: Secondary | ICD-10-CM

## 2017-04-06 DIAGNOSIS — R404 Transient alteration of awareness: Secondary | ICD-10-CM

## 2017-04-06 DIAGNOSIS — R918 Other nonspecific abnormal finding of lung field: Secondary | ICD-10-CM | POA: Diagnosis not present

## 2017-04-06 DIAGNOSIS — F03B18 Unspecified dementia, moderate, with other behavioral disturbance: Secondary | ICD-10-CM

## 2017-04-06 DIAGNOSIS — R471 Dysarthria and anarthria: Secondary | ICD-10-CM

## 2017-04-06 DIAGNOSIS — J984 Other disorders of lung: Secondary | ICD-10-CM

## 2017-04-06 DIAGNOSIS — Z794 Long term (current) use of insulin: Principal | ICD-10-CM

## 2017-04-07 ENCOUNTER — Telehealth: Payer: Self-pay | Admitting: *Deleted

## 2017-04-07 NOTE — Telephone Encounter (Signed)
[name] with [facility] called requesting verbal order for Start of Care date to begin 04/08/17. Verbal order given.

## 2017-04-08 DIAGNOSIS — I2510 Atherosclerotic heart disease of native coronary artery without angina pectoris: Secondary | ICD-10-CM | POA: Diagnosis not present

## 2017-04-08 DIAGNOSIS — E1122 Type 2 diabetes mellitus with diabetic chronic kidney disease: Secondary | ICD-10-CM | POA: Diagnosis not present

## 2017-04-08 DIAGNOSIS — I129 Hypertensive chronic kidney disease with stage 1 through stage 4 chronic kidney disease, or unspecified chronic kidney disease: Secondary | ICD-10-CM | POA: Diagnosis not present

## 2017-04-08 DIAGNOSIS — M1990 Unspecified osteoarthritis, unspecified site: Secondary | ICD-10-CM | POA: Diagnosis not present

## 2017-04-08 DIAGNOSIS — N183 Chronic kidney disease, stage 3 (moderate): Secondary | ICD-10-CM | POA: Diagnosis not present

## 2017-04-08 DIAGNOSIS — F0391 Unspecified dementia with behavioral disturbance: Secondary | ICD-10-CM | POA: Diagnosis not present

## 2017-04-11 ENCOUNTER — Telehealth: Payer: Self-pay

## 2017-04-11 NOTE — Telephone Encounter (Signed)
[name] with [facility] called to request a verbal order for the following:   Physical therapy 1 time per week for one week;   2 times per week for six weeks.   Verbal order was given.

## 2017-04-13 ENCOUNTER — Ambulatory Visit (INDEPENDENT_AMBULATORY_CARE_PROVIDER_SITE_OTHER): Payer: Medicare HMO | Admitting: Internal Medicine

## 2017-04-13 ENCOUNTER — Telehealth: Payer: Self-pay | Admitting: *Deleted

## 2017-04-13 ENCOUNTER — Encounter: Payer: Self-pay | Admitting: Internal Medicine

## 2017-04-13 VITALS — BP 148/70 | HR 76 | Temp 97.4°F | Ht 71.0 in | Wt 188.6 lb

## 2017-04-13 DIAGNOSIS — F0391 Unspecified dementia with behavioral disturbance: Secondary | ICD-10-CM

## 2017-04-13 DIAGNOSIS — R05 Cough: Secondary | ICD-10-CM | POA: Diagnosis not present

## 2017-04-13 DIAGNOSIS — K8020 Calculus of gallbladder without cholecystitis without obstruction: Secondary | ICD-10-CM | POA: Diagnosis not present

## 2017-04-13 DIAGNOSIS — D3502 Benign neoplasm of left adrenal gland: Secondary | ICD-10-CM | POA: Diagnosis not present

## 2017-04-13 DIAGNOSIS — R918 Other nonspecific abnormal finding of lung field: Secondary | ICD-10-CM | POA: Diagnosis not present

## 2017-04-13 DIAGNOSIS — J432 Centrilobular emphysema: Secondary | ICD-10-CM | POA: Insufficient documentation

## 2017-04-13 DIAGNOSIS — Z87891 Personal history of nicotine dependence: Secondary | ICD-10-CM | POA: Diagnosis not present

## 2017-04-13 DIAGNOSIS — F03B18 Unspecified dementia, moderate, with other behavioral disturbance: Secondary | ICD-10-CM

## 2017-04-13 DIAGNOSIS — R053 Chronic cough: Secondary | ICD-10-CM | POA: Insufficient documentation

## 2017-04-13 MED ORDER — ALBUTEROL SULFATE HFA 108 (90 BASE) MCG/ACT IN AERS: INHALATION_SPRAY | RESPIRATORY_TRACT | 3 refills | Status: DC

## 2017-04-13 NOTE — Telephone Encounter (Signed)
Rx faxed to [facility] 

## 2017-04-13 NOTE — Telephone Encounter (Signed)
Mary, Caregiver called and stated that patient was just seen and stated that Dr. Eulas Post told him to use the Albuterol inhaler as needed. Wants a Rx faxed to [place]. What is the updated directions so I can fax to [place]? Please Advise.

## 2017-04-13 NOTE — Progress Notes (Signed)
Patient ID: [name], male   DOB: 1922-06-01, 83 y.o.   MRN: [id number]    Location:  [facility] Place of Service: OFFICE  Chief Complaint  Patient presents with  . Follow-up    CT follow-up, DM foot exam due   . Medication Refill    No refills needed     HPI:  83 yo male seen today to discuss CT chest results. We reviewed results extensively. Of note note, he has 8x38m nodule in RML and 6x5 nodule in LLL; left adrenal gland 1x1 cm benign appearing adenoma; CAD and upper adbominal aorta atherosclerotic calcification; cholelithiasis but no cholecystitis; centrilobar emphysema; no adenopathy; small right pleural effusion. Pt continues to have a chronic cough that improves with mucinex. He uses HFA prn. Former smoker. No other concerns.  Pt is a poor historian due to dementia. Hx obtained from chart. He has an appt with neurology later this week  Past Medical History:  Diagnosis Date  . Arthritis   . CAD (coronary artery disease)    s/p DES to LAD 06/2013  . Depression   . Dyslipidemia   . Glaucoma   . Hyperglycemia 04/17/2014  . Hyperlipidemia   . Hypertension   . [place]   . [place] 2 diabetes mellitus ([place]     Past Surgical History:  Procedure Laterality Date  . CARDIAC CATHETERIZATION  06/2013   DES to LAD by Dr STamala Julian . Right Cataract      Patient Care Team: CGildardo Cranker DO as PCP - General (Internal Medicine) MHayden Pedro MD (Ophthalmology) SMacarthur Critchley [place] Referring Physician (Optometry) ADelice LeschKLezlie Octave MD as Consulting Physician (Neurology)  Social History   Social History  . Marital status: Widowed    Spouse name: N/A  . Number of children: 4  . Years of education: N/A   Occupational History  . Retired    Social History Main Topics  . Smoking status: Former Smoker    Types: Cigarettes    Quit date: 08/02/1950  . Smokeless tobacco: Never Used  . Alcohol use No  . Drug use: No  . Sexual activity: Not Currently   Other Topics Concern  . Not  on file   Social History Narrative   Retired GWarden/ranger  widowed, lives with dtr and youngest son   5 kids from first marriage     reports that he quit smoking about 657years ago. His smoking use included Cigarettes. He has never used smokeless tobacco. He reports that he does not drink alcohol or use drugs.  Family History  Problem Relation Age of Onset  . Arthritis Mother   . Breast cancer Mother   . Arthritis Father   . Dementia Father   . Diabetes Other   . Arthritis Other   . Colon cancer Other   . Stroke Other   . Diabetes type II Sister   . Suicidality Brother   . Diabetes type I Brother   . Diabetes type I Brother   . Diabetes type I Brother   . Diabetes type II Sister   . Breast cancer Daughter   . Emphysema Daughter    Family Status  Relation Status  . Mother Deceased  . Father Deceased  . Other (Not Specified)  . Sister Alive  . Brother Deceased  . Brother Alive  . Brother Alive  . Brother Deceased  . Sister Alive  . Daughter Deceased  . Daughter Alive  . Son Alive  .  Son Alive  . Daughter Deceased     Allergies  Allergen Reactions  . Lisinopril Cough    Medications: Patient's Medications  New Prescriptions   No medications on file  Previous Medications   ALBUTEROL (PROVENTIL HFA;VENTOLIN HFA) 108 (90 BASE) MCG/ACT INHALER    Use 3 times a day for 7 days, then 2 times a day for 7 days, then once daily for 7 days, then stop.   ALCOHOL SWABS (B-D SINGLE USE SWABS REGULAR) PADS    Use as directed with checking blood sugar three times daily. Dx: E11.51   AMLODIPINE (NORVASC) 5 MG TABLET    Take 1 tablet (5 mg total) by mouth daily.   ASPIRIN EC 81 MG TABLET    Take 1 tablet (81 mg total) by mouth daily.   B-D UF III MINI PEN NEEDLES 31G X 5 MM MISC    USE AS DIRECTED   BLOOD GLUCOSE CALIBRATION (ACCU-CHEK AVIVA) SOLN    Use as Directed. Dx: E11.51   BLOOD GLUCOSE MONITORING SUPPL (ACCU-CHEK AVIVA PLUS) W/DEVICE KIT    Use as directed with  checking blood sugar three times daily. Dx: E11.51   ESCITALOPRAM (LEXAPRO) 5 MG TABLET    Take one tablet by mouth once daily   FLUTICASONE (FLONASE) 50 MCG/ACT NASAL SPRAY    Place 1 spray into both nostrils daily.   GLUCOSE BLOOD (ACCU-CHEK AVIVA) TEST STRIP    Use to test blood sugar three times daily. Dx: E11.51   HYDROCHLOROTHIAZIDE (HYDRODIURIL) 25 MG TABLET    Take 1 tablet (25 mg total) by mouth daily.   INSULIN GLARGINE (TOUJEO SOLOSTAR) 300 UNIT/ML SOPN    Inject 45 Units into the skin at bedtime.   LANCET DEVICES (ACCU-CHEK SOFTCLIX) LANCETS    Use as instructed with checking blood sugar three times daily. Dx: E11.51   LORATADINE 10 MG CAPS    Take 1 capsule by mouth daily.   LOSARTAN (COZAAR) 25 MG TABLET    Take 1 tablet (25 mg total) by mouth daily.   MULTIPLE VITAMINS-MINERALS (SENTRY SENIOR) TABS    Take 1 tablet by mouth daily.   NAMZARIC 28-10 MG CP24    TAKE 1 CAPSULE EVERY DAY   SIMVASTATIN (ZOCOR) 20 MG TABLET    Take 1 tablet (20 mg total) by mouth daily.  Modified Medications   No medications on file  Discontinued Medications   No medications on file    Review of Systems  Unable to perform ROS: Dementia    Vitals:   04/13/17 0811  BP: (!) 148/70  Pulse: 76  Temp: (!) 97.4 F (36.3 C)  TempSrc: Oral  SpO2: 91%  Weight: 188 lb 9.6 oz (85.5 kg)  Height: '5\' 11"'  (1.803 m)   Body mass index is 26.3 kg/m.  Physical Exam  Constitutional: He appears well-developed and well-nourished.  Neurological: He is alert.  Skin: Skin is warm and dry. No rash noted.  Psychiatric: He has a normal mood and affect. His behavior is normal.     Labs reviewed: Appointment on 03/29/2017  Component Date Value Ref Range Status  . Sodium 03/29/2017 139  135 - 146 mmol/L Final  . Potassium 03/29/2017 4.4  3.5 - 5.3 mmol/L Final  . Chloride 03/29/2017 98  98 - 110 mmol/L Final  . CO2 03/29/2017 27  20 - 32 mmol/L Final   Comment: ** Please note change in reference range(s).  **     . Glucose, Bld 03/29/2017 185* 65 -  99 mg/dL Final  . BUN 03/29/2017 16  7 - 25 mg/dL Final  . Creat 03/29/2017 1.17* 0.70 - 1.11 mg/dL Final   Comment:   For patients > or = 81 years of age: The upper reference limit for Creatinine is approximately 13% higher for people identified as African-American.     . Total Bilirubin 03/29/2017 0.6  0.2 - 1.2 mg/dL Final  . Alkaline Phosphatase 03/29/2017 52  40 - 115 U/L Final  . AST 03/29/2017 28  10 - 35 U/L Final  . ALT 03/29/2017 25  9 - 46 U/L Final  . Total Protein 03/29/2017 6.9  6.1 - 8.1 g/dL Final  . Albumin 03/29/2017 4.5  3.6 - 5.1 g/dL Final  . Calcium 03/29/2017 10.3  8.6 - 10.3 mg/dL Final  . GFR, Est African American 03/29/2017 62  >=60 mL/min Final  . GFR, Est Non African American 03/29/2017 54* >=60 mL/min Final  . Cholesterol 03/29/2017 171  <200 mg/dL Final  . Triglycerides 03/29/2017 170* <150 mg/dL Final  . HDL 03/29/2017 47  >40 mg/dL Final  . Total CHOL/HDL Ratio 03/29/2017 3.6  <5.0 Ratio Final  . VLDL 03/29/2017 34* <30 mg/dL Final  . LDL Cholesterol 03/29/2017 90  <100 mg/dL Final  . Hgb A1c MFr Bld 03/29/2017 9.7* <5.7 % Final   Comment:   For someone without known diabetes, a hemoglobin A1c value of 6.5% or greater indicates that they may have diabetes and this should be confirmed with a follow-up test.   For someone with known diabetes, a value <7% indicates that their diabetes is well controlled and a value greater than or equal to 7% indicates suboptimal control. A1c targets should be individualized based on duration of diabetes, age, comorbid conditions, and other considerations.   Currently, no consensus exists for use of hemoglobin A1c for diagnosis of diabetes for children.     . Mean Plasma Glucose 03/29/2017 232  mg/dL Final  Office Visit on 02/07/2017  Component Date Value Ref Range Status  . NT-Pro BNP 02/07/2017 194  0 - 486 pg/mL Final   Comment: The following cut-points have been  suggested for the use of proBNP for the diagnostic evaluation of heart failure (HF) in patients with acute dyspnea: Modality                     Age           Optimal Cut                            (years)            Point ------------------------------------------------------ Diagnosis (rule in HF)        <50            450 pg/mL                           50 - 75            900 pg/mL                               >75           1800 pg/mL Exclusion (rule out HF)  Age independent     300 pg/mL     Dg Chest 2 View  Result Date: 03/23/2017 CLINICAL DATA:  Recent  episode of pneumonia. Persistent cough. Assess for radiographic improvement. EXAM: CHEST  2 VIEW COMPARISON:  Chest x-rays of February 22, 2017 and February 07, 2017. FINDINGS: The right lung is well-expanded on and clear. On the left the hemidiaphragm remains mildly elevated. There is persistent increased density in the left lower lobe perhaps slightly improved since July 9th but not significantly changed since July 24th. The heart and pulmonary vascularity are normal. There is calcification in the wall of the aortic arch. IMPRESSION: Persistent increased density in the left lower lobe. Given the lack of complete clearing over the past nearly 6 weeks, chest CT scanning is recommended. Electronically Signed   By: David  Martinique M.D.   On: 03/23/2017 10:15   Ct Head Wo Contrast  Result Date: 04/06/2017 CLINICAL DATA:  Slurred speech and gait disturbance. Fall 4 weeks prior EXAM: CT HEAD WITHOUT CONTRAST TECHNIQUE: Contiguous axial images were obtained from the base of the skull through the vertex without intravenous contrast. COMPARISON:  Dec 05, 2015 FINDINGS: Brain: There is mild diffuse atrophy. There is no evident intracranial mass, hemorrhage, or midline shift. There is a small chronic appearing subdural hygroma in the right parietal region measuring 5 mm in thickness, slightly more prominent than on previous study. No other extra-axial fluid  identified. There is evidence of a prior infarct in the left frontal lobe, stable. There is patchy small vessel disease in the centra semiovale bilaterally. There is a prior small lacunar type infarct in the anterior inferior right centrum semiovale. No acute infarct is demonstrable on this study. Basal ganglia calcification is felt to be physiologic in this age group. Vascular: There is no hyperdense vessel. There are foci of calcification in each carotid siphon. Skull: The bony calvarium appears intact. Sinuses/Orbits: There is opacification of several right-sided anterior ethmoid air cells. There is mild mucosal thickening of several ethmoid air cells bilaterally. Frontal sinuses are aplastic. Other paranasal sinuses are clear. Orbits appear symmetric bilaterally. Other: Visualized mastoid air cells are clear. IMPRESSION: 1. Small chronic appearing subdural hygroma right parietal region, slightly larger than on prior study. There is minimal impression on the right parietal lobe without midline shift. No acute hemorrhage is evident. 2. Atrophy with patchy periventricular small vessel disease. Prior infarct mid left frontal lobe. Prior small infarct in the inferior anterior right centrum semiovale. No evident acute infarct. 3.  There are foci of arterial vascular calcification. 4.  Ethmoid air cell disease, primarily on the right. Electronically Signed   By: Lowella Grip III M.D.   On: 04/06/2017 08:56   Ct Chest Wo Contrast  Result Date: 04/06/2017 CLINICAL DATA:  Fall 4 weeks prior. Left lower lobe opacity on chest radiograph EXAM: CT CHEST WITHOUT CONTRAST TECHNIQUE: Multidetector CT imaging of the chest was performed following the standard protocol without IV contrast. COMPARISON:  Chest radiograph March 23, 2017 FINDINGS: Cardiovascular: There is no demonstrable thoracic aortic aneurysm. There is calcification in the proximal great vessels. There is atherosclerotic calcification throughout portions of  the aorta. There is extensive coronary artery calcification, most severe in the left anterior descending coronary artery region. There is a minimal pericardial effusion. There is prominence of the main pulmonary outflow tract measuring 3.6 cm. Mediastinum/Nodes: Thyroid appears unremarkable. There are a few scattered mediastinal lymph nodes which do not meet size criteria for pathologic significance. There is no evident thoracic adenopathy. No appreciable esophageal lesions evident on this study. Lungs/Pleura: There is underlying centrilobular emphysematous change. There is localized pleural thickening along the  minor fissure on the right. On axial images, this area appears somewhat nodular. Nodularity is not confirmed on coronal and sagittal reformats, however. On axial slice 86 series 5, there is a nodular opacity in the lateral segment of the right middle lobe measuring 8 x 8 mm. On axial slice 320 series 5, there is a 6 x 5 mm nodular opacity in the posterior segment of the left lower lobe. There is atelectatic change in the left base adjacent to the elevated left hemidiaphragm. There is no frank airspace consolidation. There is a small right pleural effusion. No appreciable left pleural effusion. Upper Abdomen: In the visualized upper abdomen, there is cholelithiasis. There is atherosclerotic calcification in the upper abdominal aorta. There is incomplete visualization of a cyst arising from the upper pole of the right kidney measuring 3.5 by 2.5 cm. There is left adrenal hypertrophy. There is an adenoma in the left adrenal laterally measuring 1 x 1 cm. Musculoskeletal: There is degenerative change in the thoracic spine. There are no blastic or lytic bone lesions. IMPRESSION: 1. Atelectatic change left base without consolidation. This atelectasis is adjacent to a mildly elevated left hemidiaphragm, a chronic finding. 2.  Underlying centrilobular emphysematous change. 3. Nodular opacities, largest measuring 8 x  8 mm. This nodular opacity measuring 8 x 8 mm is in the right middle lobe. Non-contrast chest CT at 3-6 months is recommended. If the nodules are stable at time of repeat CT, then future CT at 18-24 months (from today's scan) is considered optional for low-risk patients, but is recommended for high-risk patients. This recommendation follows the consensus statement: Guidelines for Management of Incidental Pulmonary Nodules Detected on CT Images: From the Fleischner Society 2017; Radiology 2017; 284:228-243. 4. Small right pleural effusion. Rather minimal pericardial effusion. 5.  No evident adenopathy. 6. Multiple foci of coronary artery calcification. There is aortic atherosclerosis and great vessel calcification. 7. Prominence of the main pulmonary outflow tract may indicate a degree of underlying pulmonary artery hypertension. 8.  Cholelithiasis, incompletely visualized. 9.  Left adrenal hypertrophy.  Small benign adenoma left adrenal. Aortic Atherosclerosis (ICD10-I70.0) and Emphysema (ICD10-J43.9). Electronically Signed   By: Lowella Grip III M.D.   On: 04/06/2017 09:05     Assessment/Plan   ICD-10-CM   1. Pulmonary nodule R91.1   2. Centrilobular emphysema ([place]) J43.2   3. Calculus of gallbladder without cholecystitis without obstruction K80.20   4. Chronic cough R05    2/2 #2  5. History of tobacco abuse Z87.891   6. Moderate dementia with behavioral disturbance F03.91   7. Adenoma of left adrenal gland D35.02    Repeat CT chest without contrast in 3 mos to follow lung nodules  Continue current medications as ordered  Follow up with specialists as scheduled  Follow up in November as scheduled  Jamall Strohmeier S. Perlie Gold  [facility] [address] [place], [place] [address] [phone] Cell (Monday-Friday 8 AM - 5 PM) [phone] After 5 PM and follow prompts

## 2017-04-13 NOTE — Patient Instructions (Addendum)
Repeat CT chest without contrast in 3 mos to follow lung nodules  Continue current medications as ordered  Follow up with specialists as scheduled  Follow up in November as scheduled

## 2017-04-13 NOTE — Telephone Encounter (Signed)
Albuterol 2 puffs q4-6hr prn difficulty breathing, wheezing or cough with 3 RF

## 2017-04-14 DIAGNOSIS — F0391 Unspecified dementia with behavioral disturbance: Secondary | ICD-10-CM | POA: Diagnosis not present

## 2017-04-14 DIAGNOSIS — N183 Chronic kidney disease, stage 3 (moderate): Secondary | ICD-10-CM | POA: Diagnosis not present

## 2017-04-14 DIAGNOSIS — I129 Hypertensive chronic kidney disease with stage 1 through stage 4 chronic kidney disease, or unspecified chronic kidney disease: Secondary | ICD-10-CM | POA: Diagnosis not present

## 2017-04-14 DIAGNOSIS — M1990 Unspecified osteoarthritis, unspecified site: Secondary | ICD-10-CM | POA: Diagnosis not present

## 2017-04-14 DIAGNOSIS — E1122 Type 2 diabetes mellitus with diabetic chronic kidney disease: Secondary | ICD-10-CM | POA: Diagnosis not present

## 2017-04-14 DIAGNOSIS — I2510 Atherosclerotic heart disease of native coronary artery without angina pectoris: Secondary | ICD-10-CM | POA: Diagnosis not present

## 2017-04-15 ENCOUNTER — Ambulatory Visit: Payer: Medicare HMO | Admitting: Neurology

## 2017-04-15 DIAGNOSIS — N183 Chronic kidney disease, stage 3 (moderate): Secondary | ICD-10-CM | POA: Diagnosis not present

## 2017-04-15 DIAGNOSIS — I129 Hypertensive chronic kidney disease with stage 1 through stage 4 chronic kidney disease, or unspecified chronic kidney disease: Secondary | ICD-10-CM | POA: Diagnosis not present

## 2017-04-15 DIAGNOSIS — F0391 Unspecified dementia with behavioral disturbance: Secondary | ICD-10-CM | POA: Diagnosis not present

## 2017-04-15 DIAGNOSIS — I2510 Atherosclerotic heart disease of native coronary artery without angina pectoris: Secondary | ICD-10-CM | POA: Diagnosis not present

## 2017-04-15 DIAGNOSIS — M1990 Unspecified osteoarthritis, unspecified site: Secondary | ICD-10-CM | POA: Diagnosis not present

## 2017-04-15 DIAGNOSIS — E1122 Type 2 diabetes mellitus with diabetic chronic kidney disease: Secondary | ICD-10-CM | POA: Diagnosis not present

## 2017-04-18 ENCOUNTER — Encounter: Payer: Self-pay | Admitting: Neurology

## 2017-04-18 ENCOUNTER — Ambulatory Visit (INDEPENDENT_AMBULATORY_CARE_PROVIDER_SITE_OTHER): Payer: Medicare HMO | Admitting: Neurology

## 2017-04-18 VITALS — BP 152/70 | HR 96 | Ht 71.0 in | Wt 186.0 lb

## 2017-04-18 DIAGNOSIS — F0391 Unspecified dementia with behavioral disturbance: Secondary | ICD-10-CM | POA: Diagnosis not present

## 2017-04-18 DIAGNOSIS — R404 Transient alteration of awareness: Secondary | ICD-10-CM

## 2017-04-18 DIAGNOSIS — F03B18 Unspecified dementia, moderate, with other behavioral disturbance: Secondary | ICD-10-CM
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
## 2017-04-18 NOTE — Patient Instructions (Signed)
1. Schedule routine EEG 2. Continue all your medications 3. Start looking into [place] for more help at home 4. Follow-up as scheduled in February, call for any changes

## 2017-04-20 DIAGNOSIS — N183 Chronic kidney disease, stage 3 (moderate): Secondary | ICD-10-CM | POA: Diagnosis not present

## 2017-04-20 DIAGNOSIS — M1990 Unspecified osteoarthritis, unspecified site: Secondary | ICD-10-CM | POA: Diagnosis not present

## 2017-04-20 DIAGNOSIS — F0391 Unspecified dementia with behavioral disturbance: Secondary | ICD-10-CM | POA: Diagnosis not present

## 2017-04-20 DIAGNOSIS — I2510 Atherosclerotic heart disease of native coronary artery without angina pectoris: Secondary | ICD-10-CM | POA: Diagnosis not present

## 2017-04-20 DIAGNOSIS — E1122 Type 2 diabetes mellitus with diabetic chronic kidney disease: Secondary | ICD-10-CM | POA: Diagnosis not present

## 2017-04-20 DIAGNOSIS — I129 Hypertensive chronic kidney disease with stage 1 through stage 4 chronic kidney disease, or unspecified chronic kidney disease: Secondary | ICD-10-CM | POA: Diagnosis not present

## 2017-04-22 DIAGNOSIS — N183 Chronic kidney disease, stage 3 (moderate): Secondary | ICD-10-CM | POA: Diagnosis not present

## 2017-04-22 DIAGNOSIS — E1122 Type 2 diabetes mellitus with diabetic chronic kidney disease: Secondary | ICD-10-CM | POA: Diagnosis not present

## 2017-04-22 DIAGNOSIS — I2510 Atherosclerotic heart disease of native coronary artery without angina pectoris: Secondary | ICD-10-CM | POA: Diagnosis not present

## 2017-04-22 DIAGNOSIS — M1990 Unspecified osteoarthritis, unspecified site: Secondary | ICD-10-CM | POA: Diagnosis not present

## 2017-04-22 DIAGNOSIS — I129 Hypertensive chronic kidney disease with stage 1 through stage 4 chronic kidney disease, or unspecified chronic kidney disease: Secondary | ICD-10-CM | POA: Diagnosis not present

## 2017-04-22 DIAGNOSIS — F0391 Unspecified dementia with behavioral disturbance: Secondary | ICD-10-CM | POA: Diagnosis not present

## 2017-04-26 DIAGNOSIS — M1990 Unspecified osteoarthritis, unspecified site: Secondary | ICD-10-CM | POA: Diagnosis not present

## 2017-04-26 DIAGNOSIS — E1122 Type 2 diabetes mellitus with diabetic chronic kidney disease: Secondary | ICD-10-CM | POA: Diagnosis not present

## 2017-04-26 DIAGNOSIS — N183 Chronic kidney disease, stage 3 (moderate): Secondary | ICD-10-CM | POA: Diagnosis not present

## 2017-04-26 DIAGNOSIS — I2510 Atherosclerotic heart disease of native coronary artery without angina pectoris: Secondary | ICD-10-CM | POA: Diagnosis not present

## 2017-04-26 DIAGNOSIS — I129 Hypertensive chronic kidney disease with stage 1 through stage 4 chronic kidney disease, or unspecified chronic kidney disease: Secondary | ICD-10-CM | POA: Diagnosis not present

## 2017-04-26 DIAGNOSIS — F0391 Unspecified dementia with behavioral disturbance: Secondary | ICD-10-CM | POA: Diagnosis not present

## 2017-04-27 DIAGNOSIS — I129 Hypertensive chronic kidney disease with stage 1 through stage 4 chronic kidney disease, or unspecified chronic kidney disease: Secondary | ICD-10-CM | POA: Diagnosis not present

## 2017-04-27 DIAGNOSIS — I2510 Atherosclerotic heart disease of native coronary artery without angina pectoris: Secondary | ICD-10-CM | POA: Diagnosis not present

## 2017-04-27 DIAGNOSIS — N183 Chronic kidney disease, stage 3 (moderate): Secondary | ICD-10-CM | POA: Diagnosis not present

## 2017-04-27 DIAGNOSIS — F0391 Unspecified dementia with behavioral disturbance: Secondary | ICD-10-CM | POA: Diagnosis not present

## 2017-04-27 DIAGNOSIS — E1122 Type 2 diabetes mellitus with diabetic chronic kidney disease: Secondary | ICD-10-CM | POA: Diagnosis not present

## 2017-04-27 DIAGNOSIS — M1990 Unspecified osteoarthritis, unspecified site: Secondary | ICD-10-CM | POA: Diagnosis not present

## 2017-04-28 ENCOUNTER — Ambulatory Visit: Payer: Medicare HMO

## 2017-05-02 ENCOUNTER — Ambulatory Visit (INDEPENDENT_AMBULATORY_CARE_PROVIDER_SITE_OTHER): Payer: Medicare HMO | Admitting: Neurology

## 2017-05-02 DIAGNOSIS — F0391 Unspecified dementia with behavioral disturbance: Secondary | ICD-10-CM | POA: Diagnosis not present

## 2017-05-02 DIAGNOSIS — F03B18 Unspecified dementia, moderate, with other behavioral disturbance: Secondary | ICD-10-CM

## 2017-05-02 DIAGNOSIS — R404 Transient alteration of awareness: Secondary | ICD-10-CM | POA: Diagnosis not present

## 2017-05-06 NOTE — Procedures (Signed)
ELECTROENCEPHALOGRAM REPORT  Date of Study: 05/02/2017  Patient's Name: [name] MRN: [id number] Date of Birth: [birth year]  Referring Provider: Dr. Ellouise Newer  Clinical History: This is a 83 year old man with speech changes and a transient episode where he was moaning and groaning, ?unresponsive.   Medications: PROVENTIL HFA;VENTOLIN HFA) 108 (90 Base) MCG/ACT inhaler  NORVASC aspirin EC 81 MG ACCU-CHEK AVIVA SOLN  ACCU-CHEK AVIVA PLUS w/Device  LEXAPRO FLONASE 50 MCG/ACT nasal spray  ACCU-CHEK AVIVA test strip  HYDRODIURIL 25 MG tablet  TOUJEO SOLOSTAR300 UNIT/MLSOPN ACCU-CHEK SOFTCLIX lancets    Loratadine 10 MG CAPS   COZAAR 25 MG tablet   SENTRY SENIOR TABS   NAMZARIC 28-10 MG CP24  ZOCOR  Technical Summary: A multichannel digital 1-hour EEG recording measured by the international 10-20 system with electrodes applied with paste and impedances below 5000 ohms performed in our laboratory with EKG monitoring in an awake and drowsy patient.  Hyperventilation was not performed. Photic stimulation was performed.  The digital EEG was referentially recorded, reformatted, and digitally filtered in a variety of bipolar and referential montages for optimal display.    Description: The patient is awake and drowsy during the recording.  During maximal wakefulness, there is a symmetric, medium voltage 8.5 Hz posterior dominant rhythm that attenuates with eye opening.  The record is symmetric.  During drowsiness, there is an increase in theta slowing of the background.  Deeper stages of sleep were not seen.  Photic stimulation did not elicit any abnormalities.  There were no epileptiform discharges or electrographic seizures seen.    EKG lead showed occasional irregular rhythm.  Impression: This 1-hour awake and drowsy EEG is normal.    Clinical Correlation: A normal EEG does not exclude a clinical diagnosis of epilepsy.  If further clinical questions remain, prolonged EEG  may be helpful.  Clinical correlation is advised.   Ellouise Newer, M.D.

## 2017-05-09 ENCOUNTER — Telehealth: Payer: Self-pay

## 2017-05-09 NOTE — Telephone Encounter (Signed)
-----   Message from Cameron Sprang, MD sent at 05/09/2017 10:52 AM EDT ----- Pls let daughter know the brain wave test was normal. Thanks

## 2017-05-09 NOTE — Telephone Encounter (Signed)
LM on daughters VM relaying message below.

## 2017-05-11 DIAGNOSIS — I129 Hypertensive chronic kidney disease with stage 1 through stage 4 chronic kidney disease, or unspecified chronic kidney disease: Secondary | ICD-10-CM | POA: Diagnosis not present

## 2017-05-11 DIAGNOSIS — N183 Chronic kidney disease, stage 3 (moderate): Secondary | ICD-10-CM | POA: Diagnosis not present

## 2017-05-11 DIAGNOSIS — M1990 Unspecified osteoarthritis, unspecified site: Secondary | ICD-10-CM | POA: Diagnosis not present

## 2017-05-11 DIAGNOSIS — E1122 Type 2 diabetes mellitus with diabetic chronic kidney disease: Secondary | ICD-10-CM | POA: Diagnosis not present

## 2017-05-11 DIAGNOSIS — I2510 Atherosclerotic heart disease of native coronary artery without angina pectoris: Secondary | ICD-10-CM | POA: Diagnosis not present

## 2017-05-11 DIAGNOSIS — F0391 Unspecified dementia with behavioral disturbance: Secondary | ICD-10-CM | POA: Diagnosis not present

## 2017-05-20 ENCOUNTER — Other Ambulatory Visit: Payer: Self-pay

## 2017-05-20 MED ORDER — ZOSTER VAC RECOMB ADJUVANTED 50 MCG/0.5ML IM SUSR: 0.5000 mL | Freq: Once | INTRAMUSCULAR | 1 refills | Status: AC

## 2017-05-20 NOTE — Telephone Encounter (Signed)
Patient's daughter called to ask for a Shingrix prescription to be sent to patient's pharmacy. Rx was sent to pharmacy electronically.

## 2017-05-27 ENCOUNTER — Encounter: Payer: Self-pay | Admitting: Internal Medicine

## 2017-06-13 ENCOUNTER — Telehealth: Payer: Self-pay | Admitting: Internal Medicine

## 2017-06-13 NOTE — Telephone Encounter (Signed)
Received FMLA paperwork, labeled and printed face sheet and last OV note and attached. Placed in Dr. [place] folder to review, fill out and sign.   Call [name], daughter when finished [phone]  [employer] Employ[employer]-for daughter, May Folsom to care for patient if he needs medical attention or dementia or other care needs.

## 2017-06-13 NOTE — Telephone Encounter (Signed)
Daughter [name] dropped off FMLA forms to be completed.  Intake forms completed and folder was placed in MA's bin for review.

## 2017-06-14 ENCOUNTER — Encounter: Payer: Self-pay | Admitting: *Deleted

## 2017-06-14 DIAGNOSIS — H5210 Myopia, unspecified eye: Secondary | ICD-10-CM | POA: Diagnosis not present

## 2017-06-14 LAB — HM DIABETES EYE EXAM

## 2017-06-17 DIAGNOSIS — Z029 Encounter for administrative examinations, unspecified: Secondary | ICD-10-CM

## 2017-06-29 ENCOUNTER — Other Ambulatory Visit: Payer: Medicare HMO

## 2017-06-29 DIAGNOSIS — Z794 Long term (current) use of insulin: Principal | ICD-10-CM

## 2017-06-29 DIAGNOSIS — R471 Dysarthria and anarthria: Secondary | ICD-10-CM

## 2017-06-29 DIAGNOSIS — E1122 Type 2 diabetes mellitus with diabetic chronic kidney disease: Secondary | ICD-10-CM

## 2017-06-29 DIAGNOSIS — N183 Chronic kidney disease, stage 3 unspecified: Secondary | ICD-10-CM

## 2017-06-29 DIAGNOSIS — F03B18 Unspecified dementia, moderate, with other behavioral disturbance: Secondary | ICD-10-CM

## 2017-06-29 DIAGNOSIS — F0391 Unspecified dementia with behavioral disturbance: Secondary | ICD-10-CM

## 2017-06-30 LAB — LIPID PANEL
CHOL/HDL RATIO: 3.9 (calc) (ref <5.0)
CHOLESTEROL: 151 mg/dL (ref <200)
HDL: 39 mg/dL — ABNORMAL LOW (ref >40)
LDL Cholesterol (Calc): 89 mg/dL (calc)
Non-HDL Cholesterol (Calc): 112 mg/dL (calc) (ref <130)
Triglycerides: 126 mg/dL (ref <150)

## 2017-06-30 LAB — COMPLETE METABOLIC PANEL WITH GFR
AG Ratio: 1.5 (calc) (ref 1.0–2.5)
ALT: 32 U/L (ref 9–46)
AST: 31 U/L (ref 10–35)
Albumin: 4.1 g/dL (ref 3.6–5.1)
Alkaline phosphatase (APISO): 64 U/L (ref 40–115)
BUN/Creatinine Ratio: 10 (calc) (ref 6–22)
BUN: 16 mg/dL (ref 7–25)
CALCIUM: 9.7 mg/dL (ref 8.6–10.3)
CO2: 37 mmol/L — AB (ref 20–32)
CREATININE: 1.55 mg/dL — AB (ref 0.70–1.11)
Chloride: 100 mmol/L (ref 98–110)
GFR, EST AFRICAN AMERICAN: 44 mL/min/{1.73_m2} — AB (ref >=60)
GFR, EST NON AFRICAN AMERICAN: 38 mL/min/{1.73_m2} — AB (ref >=60)
Globulin: 2.8 g/dL (calc) (ref 1.9–3.7)
Glucose, Bld: 141 mg/dL — ABNORMAL HIGH (ref 65–99)
Potassium: 4.3 mmol/L (ref 3.5–5.3)
SODIUM: 142 mmol/L (ref 135–146)
TOTAL PROTEIN: 6.9 g/dL (ref 6.1–8.1)
Total Bilirubin: 0.5 mg/dL (ref 0.2–1.2)

## 2017-06-30 LAB — HEMOGLOBIN A1C
Hgb A1c MFr Bld: 9.8 % of total Hgb — ABNORMAL HIGH (ref <5.7)
Mean Plasma Glucose: 235 (calc)
eAG (mmol/L): 13 (calc)

## 2017-07-01 ENCOUNTER — Ambulatory Visit: Payer: Medicare HMO | Admitting: Internal Medicine

## 2017-07-04 ENCOUNTER — Other Ambulatory Visit: Payer: Self-pay

## 2017-07-04 MED ORDER — INSULIN GLARGINE 300 UNIT/ML ~~LOC~~ SOPN: 50.0000 [IU] | PEN_INJECTOR | Freq: Every day | SUBCUTANEOUS | 6 refills | Status: DC

## 2017-07-04 NOTE — Telephone Encounter (Signed)
Medication list has been updated to reflect changes based on lab results. A refill has been sent to pharmacy for toujeo due to dose increase.

## 2017-07-05 ENCOUNTER — Ambulatory Visit: Payer: Medicare HMO | Admitting: Internal Medicine

## 2017-07-05 ENCOUNTER — Encounter: Payer: Self-pay | Admitting: Internal Medicine

## 2017-07-05 VITALS — BP 144/78 | HR 84 | Temp 98.6°F | Ht 71.0 in | Wt 199.0 lb

## 2017-07-05 DIAGNOSIS — Z794 Long term (current) use of insulin: Secondary | ICD-10-CM

## 2017-07-05 DIAGNOSIS — F0391 Unspecified dementia with behavioral disturbance: Secondary | ICD-10-CM

## 2017-07-05 DIAGNOSIS — N183 Chronic kidney disease, stage 3 (moderate): Secondary | ICD-10-CM | POA: Diagnosis not present

## 2017-07-05 DIAGNOSIS — R918 Other nonspecific abnormal finding of lung field: Secondary | ICD-10-CM

## 2017-07-05 DIAGNOSIS — Z8673 Personal history of transient ischemic attack (TIA), and cerebral infarction without residual deficits: Secondary | ICD-10-CM | POA: Diagnosis not present

## 2017-07-05 DIAGNOSIS — I10 Essential (primary) hypertension: Secondary | ICD-10-CM

## 2017-07-05 DIAGNOSIS — E1122 Type 2 diabetes mellitus with diabetic chronic kidney disease: Secondary | ICD-10-CM | POA: Diagnosis not present

## 2017-07-05 DIAGNOSIS — J432 Centrilobular emphysema: Secondary | ICD-10-CM

## 2017-07-05 DIAGNOSIS — F03B18 Unspecified dementia, moderate, with other behavioral disturbance: Secondary | ICD-10-CM

## 2017-07-05 DIAGNOSIS — R600 Localized edema: Secondary | ICD-10-CM

## 2017-07-05 DIAGNOSIS — E782 Mixed hyperlipidemia: Secondary | ICD-10-CM

## 2017-07-05 MED ORDER — FUROSEMIDE 20 MG PO TABS: 20.0000 mg | ORAL_TABLET | Freq: Every day | ORAL | 3 refills | Status: DC | PRN

## 2017-07-05 NOTE — Progress Notes (Signed)
Patient ID: [name], male   DOB: Aug 26, 1921, 83 y.o.   MRN: [id number]   Location:  [facility] OFFICE  Provider: DR Arletha Grippe  Code Status:  Goals of Care:  Advanced Directives 02/22/2017  Does Patient Have a Medical Advance Directive? Yes  Type of Paramedic of [place];Living will  Does patient want to make changes to medical advance directive? No - Patient declined  Copy of [place] in Chart? No - copy requested  Would patient like information on creating a medical advance directive? -     Chief Complaint  Patient presents with  . Medical Management of Chronic Issues    3 months follow up for DM, HTN, Dementia    HPI: Patient is a 83 y.o. male seen today for medical management of chronic diseases.    Pulmonary nodules - Cough has resolved and daughter reports no chest congestion. Denies hemoptysis. CT chest in 04/2017 revealed 8x83m nodule in RML and 6x5 nodule in LLL.  DM - A1c 9.8% (prev 9.7%). Toujeo increased to 50 units in last few days. Off metformin due to worsening renal fxn. BS at home 130-140s, occasionally 190s. No low BS reactions. No numbness/tingling in hands/feet. Urine microalbumin/cr ratio 33. He gets a cough with lisinopril. Currently taking losartan  HTN/CAD - s/p DES in 2014. BP stable on losartan, amlodipine and HCTZ. Takes ASA daily. He was seen by cardio Dr STamala Julianin July 2018 and told to f/u prn. LDL 94. 2D echo in Feb 2017 showed EF 65-70% and no wall motion abnormalities but does have grade 1 DD;mild-mod AI; mild MR. He has chronic LE swelling and has gained 10 lbs since Aug 2018.  Dementia with behavioral disturbance - takes namzaric for cognition and no significant changes. He takes lexapro for depression. Albumin 4.5. Weight up 10 lbs. MMSE 11/30. Followed by neurology Dr ADelice Lesch Hyperlipidemia - stable on simvastatin. No myalgias. LDL 89; HDL 39; TG 126.   Depression - stable on lexapro. No behavioral  changes  Gl[facility] - stable. [facility] Currently not on meds. No change in vision  Arthritis - stable pain. No worsening swelling  CKD - stage 3. Cr 1.55 (prev 1.17)  Hx CVA - speech is occasionally garbled and he has unsteady gait. He has been falling more. He takes ASA daily  Seasonal allergy - overall stable on claritin but does have breakthrough rhinorrhea     Past Medical History:  Diagnosis Date  . Arthritis   . CAD (coronary artery disease)    s/p DES to LAD 06/2013  . Depression   . Dyslipidemia   . Glaucoma   . Hyperglycemia 04/17/2014  . Hyperlipidemia   . Hypertension   . Stroke ([place]   . Type 2 diabetes mellitus ([facility]     Past Surgical History:  Procedure Laterality Date  . CARDIAC CATHETERIZATION  06/2013   DES to LAD by Dr STamala Julian . Right Cataract      Allergies  Allergen Reactions  . Lisinopril Cough    Outpatient Encounter Medications as of 07/05/2017  Medication Sig  . albuterol (PROVENTIL HFA;VENTOLIN HFA) 108 (90 Base) MCG/ACT inhaler Two puffs every 4-6 hours as needed for difficulty breathing,wheezing or cough  . Alcohol Swabs (B-D SINGLE USE SWABS REGULAR) PADS Use as directed with checking blood sugar three times daily. Dx: E11.51  . amLODipine (NORVASC) 5 MG tablet Take 1 tablet (5 mg total) by mouth  daily.  . aspirin EC 81 MG tablet Take 1 tablet (81 mg total) by mouth daily.  . B-D UF III MINI PEN NEEDLES 31G X 5 MM MISC USE AS DIRECTED  . Blood Glucose Calibration (ACCU-CHEK AVIVA) SOLN Use as Directed. Dx: E11.51  . Blood Glucose Monitoring Suppl (ACCU-CHEK AVIVA PLUS) w/Device KIT Use as directed with checking blood sugar three times daily. Dx: E11.51  . escitalopram (LEXAPRO) 5 MG tablet Take one tablet by mouth once daily  . fluticasone (FLONASE) 50 MCG/ACT nasal spray Place 1 spray into both nostrils daily.  [name] glucose blood (ACCU-CHEK AVIVA) test strip Use to test blood sugar three times daily.  Dx: E11.51  . hydrochlorothiazide (HYDRODIURIL) 25 MG tablet Take 1 tablet (25 mg total) by mouth daily.  . Insulin Glargine (TOUJEO MAX SOLOSTAR) 300 UNIT/ML SOPN Inject 50 Units into the skin daily.  Elmore Guise Devices (ACCU-CHEK SOFTCLIX) lancets Use as instructed with checking blood sugar three times daily. Dx: E11.51  . Loratadine 10 MG CAPS Take 1 capsule by mouth daily.  [name] losartan (COZAAR) 25 MG tablet Take 1 tablet (25 mg total) by mouth daily.  . Multiple Vitamins-Minerals (SENTRY SENIOR) TABS Take 1 tablet by mouth daily.  [name] NAMZARIC 28-10 MG CP24 TAKE 1 CAPSULE EVERY DAY  . simvastatin (ZOCOR) 20 MG tablet Take 1 tablet (20 mg total) by mouth daily.   No facility-administered encounter medications on file as of 07/05/2017.     Review of Systems:  Review of Systems  Unable to perform ROS: Dementia    Health Maintenance  Topic Date Due  . FOOT EXAM  04/02/2017  . HEMOGLOBIN A1C  12/27/2017  . OPHTHALMOLOGY EXAM  06/14/2018  . TETANUS/TDAP  07/29/2025  . INFLUENZA VACCINE  Completed  . PNA vac Low Risk Adult  Completed    Physical Exam: Vitals:   07/05/17 0822  BP: (!) 144/78  Pulse: 84  Temp: 98.6 F (37 C)  TempSrc: Oral  SpO2: 96%  Weight: 199 lb (90.3 kg)  Height: '5\' 11"'  (1.803 m)   Body mass index is 27.75 kg/m. Physical Exam  Constitutional: He appears well-developed and well-nourished.  HENT:  Mouth/Throat: Oropharynx is clear and moist.  MMM; no oral thrush  Eyes: Pupils are equal, round, and reactive to light. No scleral icterus.  Neck: Neck supple. Carotid bruit is not present. No thyromegaly present.  Cardiovascular: Normal rate, regular rhythm and intact distal pulses. Exam reveals no gallop and no friction rub.  Murmur (1/6 SEM) heard. +2 pitting BLE edema. No calf TTP. Chronic venous stasis changes  Pulmonary/Chest: Effort normal and breath sounds normal. He has no wheezes. He has no rales. He exhibits no tenderness.  Reduced BS b/l at base    Abdominal: Soft. Normal appearance and bowel sounds are normal. He exhibits no distension, no abdominal bruit, no pulsatile midline mass and no mass. There is no hepatomegaly. There is no tenderness. There is no rigidity, no rebound and no guarding. No hernia.  Musculoskeletal: He exhibits edema.  Lymphadenopathy:    He has no cervical adenopathy.  Neurological: He is alert.  Skin: Skin is warm and dry. No rash noted.  Psychiatric: He has a normal mood and affect. His behavior is normal. Thought content normal.    Labs reviewed: Basic Metabolic Panel: Recent Labs    12/10/16 0913 03/29/17 0812 06/29/17 0858  NA 133* 139 142  K 4.1 4.4 4.3  CL 93* 98 100  CO2 32* 27 37*  GLUCOSE 379* 185* 141*  BUN '17 16 16  ' CREATININE 1.48* 1.17* 1.55*  CALCIUM 9.6 10.3 9.7   Liver Function Tests: Recent Labs    07/21/16 0840 12/10/16 0913 03/29/17 0812 06/29/17 0858  AST 52* 38* 28 31  ALT 43 38 25 32  ALKPHOS 45 60 52  --   BILITOT 0.4 0.4 0.6 0.5  PROT 6.5 6.7 6.9 6.9  ALBUMIN 4.1 4.2 4.5  --    No results for input(s): LIPASE, AMYLASE in the last 8760 hours. No results for input(s): AMMONIA in the last 8760 hours. CBC: No results for input(s): WBC, NEUTROABS, HGB, HCT, MCV, PLT in the last 8760 hours. Lipid Panel: Recent Labs    07/21/16 0840 12/10/16 0913 03/29/17 0812 06/29/17 0858  CHOL 154 171 171 151  HDL 41 34* 47 39*  LDLCALC 92 94 90  --   TRIG 106 214* 170* 126  CHOLHDL 3.8 5.0* 3.6 3.9   Lab Results  Component Value Date   HGBA1C 9.8 (H) 06/29/2017    Procedures since last visit: No results found.  Assessment/Plan   ICD-10-CM   1. Bilateral lower extremity edema with 10lb weight gain R60.0 furosemide (LASIX) 20 MG tablet    BMP with eGFR  2. Pulmonary nodules R91.8   3. Centrilobular emphysema ([place]) J43.2   4. Type 2 diabetes mellitus with stage 3 chronic kidney disease, with long-term current use of insulin ([facility]) E11.22 BMP with eGFR   N18.3 BMP  with eGFR   Z79.4 ALT    Hemoglobin A1c    Lipid Panel    Microalbumin/Creatinine Ratio, Urine  5. Moderate dementia with behavioral disturbance F03.91   6. Essential hypertension I10   7. History of stroke Z86.73   8. Mixed hyperlipidemia E78.2 Lipid Panel   START FUROSEMIDE (LASIX) DAILY X 3 DAYS THEN TAKE AS NEEDED FOR SWELLING  LAB APPT IN 1 WEEK (NONFASTING)  Daily weight at home - call office gain >2 lbs in 1 day or 5 lbs in 7 days  Continue other medications as ordered  Will call with CT chest appt to follow lung nodules  Follow up with neurology as scheduled  Follow up in 3 mos for DM, HTn and pulmonary nodules. Fasting labs prior to 3 mos ov  Tajana Crotteau S. Perlie Gold  [facility] [address] [place], [place] [address] [phone] Cell (Monday-Friday 8 AM - 5 PM) [phone] After 5 PM and follow prompts

## 2017-07-05 NOTE — Addendum Note (Signed)
Addended by: Moshe Cipro, MESHELL A on: 07/05/2017 01:19 PM   Modules accepted: Orders

## 2017-07-05 NOTE — Patient Instructions (Addendum)
START FUROSEMIDE (LASIX) DAILY X 3 DAYS THEN TAKE AS NEEDED FOR SWELLING  LAB APPT IN 1 WEEK (NONFASTING)  Daily weight at home - call office gain >2 lbs in 1 day or 5 lbs in 7 days  Continue other medications as ordered  Will call with CT chest appt to follow lung nodules  Follow up with neurology as scheduled  Follow up in 3 mos for DM, HTn and pulmonary nodules. Fasting labs prior to 3 mos ov

## 2017-07-06 ENCOUNTER — Telehealth: Payer: Self-pay

## 2017-07-06 NOTE — Telephone Encounter (Signed)
Noted  

## 2017-07-06 NOTE — Telephone Encounter (Signed)
Spoke with daughter she stated that she called insurance company and they stated that the copay will go down in January to $180. She stated that she will call [facility] and reschedule his Appt. For January 2019. I provided the number to her.

## 2017-07-06 NOTE — Telephone Encounter (Signed)
Called insurance company to verify if CT Chest W/o Contrast was covered. They stated that it is covered but patient would have to pay a $295 copay.   Called daughter [name] she stated that she did not want [place] calling her on her home phone number and to remove it. I completely removed number from chart. I then informed her of copay she stated that she was going to call insurance company and give [place] a call back.  There is an appt. For 07/11/17 at 2:20 at [place] patient should arrive at 2:00pm.  Address [address]

## 2017-07-11 ENCOUNTER — Other Ambulatory Visit: Payer: Medicare HMO

## 2017-08-03 ENCOUNTER — Telehealth: Payer: Self-pay

## 2017-08-03 NOTE — Telephone Encounter (Signed)
Called insurance company to complete prior authorization at end of call I was told that there will need to be a clinic review and that they will fax information over for review. Awaiting fax.    Referance # [id number]  CPT [phone] [facility]   [address]

## 2017-08-03 NOTE — Telephone Encounter (Signed)
Called insurance company back to check status. Completed patient was approved  confirmation # [id number]  Fax is to be sent here to office

## 2017-08-04 ENCOUNTER — Ambulatory Visit
Admission: RE | Admit: 2017-08-04 | Discharge: 2017-08-04 | Disposition: A | Discharge status: Home / Self Care | Payer: Medicare HMO | Source: Ambulatory Visit | Attending: Internal Medicine | Admitting: Internal Medicine

## 2017-08-04 ENCOUNTER — Telehealth: Payer: Self-pay

## 2017-08-04 DIAGNOSIS — R918 Other nonspecific abnormal finding of lung field: Secondary | ICD-10-CM

## 2017-08-04 NOTE — Telephone Encounter (Signed)
-----   Message from [place], [place] sent at 08/04/2017 12:07 PM EST ----- Pulmonary nodules increased in size since last study in Sept 2018 - get PET-CT to further evaluate enlarging nodules; r/o mesothelioma/pleural carcinomatosis

## 2017-08-04 NOTE — Telephone Encounter (Signed)
Discussed results with patient's daughter, [name] verbalized understanding of results and is in agreement with PET CT.   Patient would like scheduling notes to indicate morning appointment preferred   Order pending, please confirm correct test ordered and complete order

## 2017-08-04 NOTE — Telephone Encounter (Signed)
done

## 2017-09-07 ENCOUNTER — Other Ambulatory Visit: Payer: Self-pay | Admitting: Internal Medicine

## 2017-09-13 ENCOUNTER — Encounter (HOSPITAL_COMMUNITY): Payer: Medicare HMO

## 2017-09-16 ENCOUNTER — Telehealth: Payer: Self-pay

## 2017-09-16 NOTE — Telephone Encounter (Signed)
Patient's daughter [name] called upset that Full Body CT Scan has to be rescheduled for the second time for the order was not placed correctly.  I apologized to [facility] and told her I seen an order for a PET Scan and questioned if that is what she was referring to. Mary responded no and instructed me to call [phone] to get this figured out ASAP.   I called [phone] [facility] Scheduling), spoke with Charmella who forwarded me to April, per April the order is fine the pre-cert was done for [facility] and [place] does not have a PET [employer]. Pre-Cert needs to be completed for [employer], all pertinent information (tax ID, NPI, and address) was given to me by April.   I called the insurance company, after 3 transfer's and 48 minutes of providing information to different representatives.   Ref # [id number] Direct Contact [phone]   Authorization number [id number], fax will be sent as well    I called patient's daughter and informed her its ok to schedule appointment.

## 2017-09-17 ENCOUNTER — Encounter (HOSPITAL_COMMUNITY): Payer: Medicare HMO

## 2017-09-19 ENCOUNTER — Telehealth: Payer: Self-pay

## 2017-09-19 NOTE — Telephone Encounter (Signed)
[place] called to say that patient's PET scan authorization expired on 09/17/17. An extension may be able to be done.    Patient's approval information was placed on Cynthia's desk for re-approval.

## 2017-09-20 ENCOUNTER — Encounter (HOSPITAL_COMMUNITY)
Admission: RE | Admit: 2017-09-20 | Discharge: 2017-09-20 | Disposition: A | Discharge status: Home / Self Care | Payer: Medicare HMO | Source: Ambulatory Visit | Attending: Internal Medicine | Admitting: Internal Medicine

## 2017-09-20 ENCOUNTER — Telehealth: Payer: Self-pay

## 2017-09-20 DIAGNOSIS — R918 Other nonspecific abnormal finding of lung field: Secondary | ICD-10-CM | POA: Diagnosis not present

## 2017-09-20 DIAGNOSIS — N400 Enlarged prostate without lower urinary tract symptoms: Secondary | ICD-10-CM

## 2017-09-20 DIAGNOSIS — R948 Abnormal results of function studies of other organs and systems: Secondary | ICD-10-CM

## 2017-09-20 LAB — GLUCOSE, CAPILLARY: Glucose-Capillary: 103 mg/dL — ABNORMAL HIGH (ref 65–99)

## 2017-09-20 MED ORDER — FLUDEOXYGLUCOSE F - 18 (FDG) INJECTION
9.9500 | Freq: Once | INTRAVENOUS | Status: AC | PRN
  Administered 2017-09-20: 9.95 via INTRAVENOUS

## 2017-09-20 NOTE — Telephone Encounter (Signed)
-----   Message from [place], [place] sent at 09/20/2017  2:28 PM EST ----- PET scan revealed lung nodules that are nonspecific and recommend repeat CT chest without contrast in 6 mos to follow; small amount of fluid seen in right lung but does not appear cancerous and not enough to remove; prostate enlarged with a focal area that may be cancerous - recommend urology eval to further work up; follow up as scheduled

## 2017-09-20 NOTE — Telephone Encounter (Addendum)
Spoke with patient's daughter Ursula Beath informed of results as inturpeted by Gildardo Cranker, DO.  [name] was unsatisfied with me calling vs Dr.Carter calling to relay information as provided in report. [name] would like for Dr.Carter to call her and Dr.Carter only. [name] is requesting a full explanation of nodules and other potential diagnosis  The conversation ended by [facility] saying she has had enough and hung up.  Urology Referral pending, please complete

## 2017-09-21 NOTE — Telephone Encounter (Signed)
Please advise if you spoke with patient's daughter

## 2017-09-23 ENCOUNTER — Ambulatory Visit: Payer: Medicare HMO | Admitting: Neurology

## 2017-09-26 DIAGNOSIS — N429 Disorder of prostate, unspecified: Secondary | ICD-10-CM | POA: Diagnosis not present

## 2017-09-26 DIAGNOSIS — N401 Enlarged prostate with lower urinary tract symptoms: Secondary | ICD-10-CM | POA: Diagnosis not present

## 2017-09-30 ENCOUNTER — Other Ambulatory Visit: Payer: Medicare HMO

## 2017-09-30 DIAGNOSIS — N183 Chronic kidney disease, stage 3 (moderate): Principal | ICD-10-CM

## 2017-09-30 DIAGNOSIS — E1122 Type 2 diabetes mellitus with diabetic chronic kidney disease: Secondary | ICD-10-CM

## 2017-09-30 DIAGNOSIS — Z794 Long term (current) use of insulin: Principal | ICD-10-CM

## 2017-09-30 DIAGNOSIS — E782 Mixed hyperlipidemia: Secondary | ICD-10-CM

## 2017-10-01 LAB — BASIC METABOLIC PANEL WITH GFR
BUN/Creatinine Ratio: 13 (calc) (ref 6–22)
BUN: 18 mg/dL (ref 7–25)
CALCIUM: 9.9 mg/dL (ref 8.6–10.3)
CO2: 37 mmol/L — ABNORMAL HIGH (ref 20–32)
CREATININE: 1.41 mg/dL — AB (ref 0.70–1.11)
Chloride: 102 mmol/L (ref 98–110)
GFR, EST NON AFRICAN AMERICAN: 43 mL/min/{1.73_m2} — AB (ref >=60)
GFR, Est African American: 49 mL/min/{1.73_m2} — ABNORMAL LOW (ref >=60)
Glucose, Bld: 84 mg/dL (ref 65–139)
Potassium: 4.5 mmol/L (ref 3.5–5.3)
SODIUM: 144 mmol/L (ref 135–146)

## 2017-10-01 LAB — MICROALBUMIN / CREATININE URINE RATIO
Creatinine, Urine: 91 mg/dL (ref 20–320)
MICROALB UR: 21.1 mg/dL
MICROALB/CREAT RATIO: 232 ug/mg{creat} — AB (ref <30)

## 2017-10-01 LAB — LIPID PANEL
CHOL/HDL RATIO: 4.1 (calc) (ref <5.0)
CHOLESTEROL: 147 mg/dL (ref <200)
HDL: 36 mg/dL — AB (ref >40)
LDL Cholesterol (Calc): 87 mg/dL (calc)
Non-HDL Cholesterol (Calc): 111 mg/dL (calc) (ref <130)
Triglycerides: 139 mg/dL (ref <150)

## 2017-10-01 LAB — ALT: ALT: 20 U/L (ref 9–46)

## 2017-10-01 LAB — HEMOGLOBIN A1C
EAG (MMOL/L): 10.6 (calc)
Hgb A1c MFr Bld: 8.3 % of total Hgb — ABNORMAL HIGH (ref <5.7)
Mean Plasma Glucose: 192 (calc)

## 2017-10-04 ENCOUNTER — Encounter: Payer: Self-pay | Admitting: Internal Medicine

## 2017-10-04 ENCOUNTER — Ambulatory Visit: Payer: Medicare HMO | Admitting: Internal Medicine

## 2017-10-04 VITALS — BP 148/70 | HR 82 | Temp 98.7°F | Ht 71.0 in | Wt 199.0 lb

## 2017-10-04 DIAGNOSIS — E782 Mixed hyperlipidemia: Secondary | ICD-10-CM

## 2017-10-04 DIAGNOSIS — N4289 Other specified disorders of prostate: Secondary | ICD-10-CM

## 2017-10-04 DIAGNOSIS — R918 Other nonspecific abnormal finding of lung field: Secondary | ICD-10-CM | POA: Diagnosis not present

## 2017-10-04 DIAGNOSIS — F0391 Unspecified dementia with behavioral disturbance: Secondary | ICD-10-CM | POA: Diagnosis not present

## 2017-10-04 DIAGNOSIS — N429 Disorder of prostate, unspecified: Secondary | ICD-10-CM | POA: Diagnosis not present

## 2017-10-04 DIAGNOSIS — N183 Chronic kidney disease, stage 3 unspecified: Secondary | ICD-10-CM

## 2017-10-04 DIAGNOSIS — J432 Centrilobular emphysema: Secondary | ICD-10-CM

## 2017-10-04 DIAGNOSIS — Z8673 Personal history of transient ischemic attack (TIA), and cerebral infarction without residual deficits: Secondary | ICD-10-CM

## 2017-10-04 DIAGNOSIS — Z794 Long term (current) use of insulin: Secondary | ICD-10-CM | POA: Diagnosis not present

## 2017-10-04 DIAGNOSIS — I10 Essential (primary) hypertension: Secondary | ICD-10-CM

## 2017-10-04 DIAGNOSIS — R600 Localized edema: Secondary | ICD-10-CM | POA: Diagnosis not present

## 2017-10-04 DIAGNOSIS — F03B18 Unspecified dementia, moderate, with other behavioral disturbance: Secondary | ICD-10-CM

## 2017-10-04 DIAGNOSIS — E1122 Type 2 diabetes mellitus with diabetic chronic kidney disease: Secondary | ICD-10-CM | POA: Diagnosis not present

## 2017-10-04 NOTE — Patient Instructions (Addendum)
INCREASE INSULIN to 65 units daily due to elevated blood sugar  INCREASE LOSARTAN 50MG  daily to protect kidneys from diabetes  Continue other medication as ordered  Follow up with specialists as scheduled  KEEP LEGS ELEVATED ON PILLOW WHEN SEATED IN RECLINER  Recommend shower at least 3 times per week to improve hygiene  Home health referral for home care aide  Repeat CT chest without contrast in 6 mos to follow lung nodules  Follow up in 3 mos for DM, emphysema, HTN, LE edema, CKD. Fasting labs prior to appt

## 2017-10-04 NOTE — Progress Notes (Signed)
Patient ID: [name], male   DOB: 1921/11/21, 84 y.o.   MRN: [id number]   Location:  [facility] OFFICE  Provider: DR Arletha Grippe  Code Status: Goals of Care:  Advanced Directives 02/22/2017  Does Patient Have a Medical Advance Directive? Yes  Type of Paramedic of [place];Living will  Does patient want to make changes to medical advance directive? No - Patient declined  Copy of [place] in Chart? No - copy requested  Would patient like information on creating a medical advance directive? -     Chief Complaint  Patient presents with  . Medical Management of Chronic Issues    3 month follow-up on DM, HTN, and pulmonary nodules. Discuss results from [facility] (patient not contacted yet)   . Medication Refill    No refills needed   . Health Maintenance    Foot Exam Due     HPI: Patient is a 84 y.o. male seen today for medical management of chronic diseases. He has no concerns today. Appetite ok and sleeps well. He is a poor historian due to dementia. Hx obtained from chart. Daughter c/a prostate mass and PSA results. Pt was seen by urology Dr Felicie Morn last week. Results have not been called to her yet. Per care everywhere, PSA >7.   Pulmonary nodules - Cough has resolved and daughter reports no chest congestion. Denies hemoptysis. CT chest in Jan 2019 revealed multiple right sided pulmonary nodules and PET scan recommended. He had a PET scan in Feb 2019 that revealed right minor fissure ovoid nodular density 17 mm suspicious for intrapulmonary lymph node; several other subcm nodules in RUL and RLL; no FDG uptake; small right pleural effusion; mildly enlarged prostate with FDG uptake at right lateral peripheral zone; b/l renal and left hepatic lobe cysts; aortic atherosclerosis.  Prostate mass - noted on PET scan in Feb 2019 (mildly enlarged prostate with FDG uptake at right lateral peripheral zone). Followed by urology Dr Felicie Morn.   DM -  A1c 8.3% (prev 9.8%). His daughter is giving him 60 units Toujeo due to elevated BS. Off metformin due to worsening renal fxn. BS at home <200 now. No low BS reactions. No numbness/tingling in hands/feet. Urine microalbumin/cr ratio 232 (prev 33). He gets a cough with lisinopril. Currently taking losartan 79m daily. LDL 87  HTN/CAD - s/p DES in 2014. BP stable on losartan, amlodipine and HCTZ. Takes ASA daily. He was seen by cardio Dr STamala Julianin July 2018 and told to f/u prn. LDL 94. 2D echo in Feb 2017 showed EF 65-70% and no wall motion abnormalities but does have grade 1 DD;mild-mod AI; mild MR. He has chronic LE swelling and has gained 10 lbs since Aug 2018.  Dementia with behavioral disturbance - takes namzaric for cognition and no significant changes. He takes lexapro for depression. Albumin 4.5. Weight stable. MMSE 11/30. Followed by neurology Dr ADelice Lesch Hyperlipidemia - stable on simvastatin. No myalgias. LDL 87; HDL 36; TG 139.   Depression - stable on lexapro. No behavioral changes  [facility] Currently not on meds. No change in vision  Arthritis - stable pain. No worsening swelling  CKD - stage 3. Cr 1.41 (prev 1.55)  Hx CVA - speech is occasionally garbled and he has unsteady gait. He has been falling more. He takes ASA daily  Seasonal allergy - overall stable on claritin but does have breakthrough rhinorrhea   Past  Medical History:  Diagnosis Date  . Arthritis   . CAD (coronary artery disease)    s/p DES to LAD 06/2013  . Depression   . Dyslipidemia   . Glaucoma   . Hyperglycemia 04/17/2014  . Hyperlipidemia   . Hypertension   . Stroke ([place])   . Type 2 diabetes mellitus ([place])     Past Surgical History:  Procedure Laterality Date  . CARDIAC CATHETERIZATION  06/2013   DES to LAD by Dr Tamala Julian  . Right Cataract       reports that he quit smoking about 49 years ago. His smoking use included cigarettes. he has  never used smokeless tobacco. He reports that he does not drink alcohol or use drugs. Social History   Socioeconomic History  . Marital status: Widowed    Spouse name: Not on file  . Number of children: 4  . Years of education: Not on file  . Highest education level: Not on file  Social Needs  . Financial resource strain: Not on file  . Food insecurity - worry: Not on file  . Food insecurity - inability: Not on file  . Transportation needs - medical: Not on file  . Transportation needs - non-medical: Not on file  Occupational History  . Occupation: Retired  Tobacco Use  . Smoking status: Former Smoker    Types: Cigarettes    Last attempt to quit: 08/02/1968    Years since quitting: 49.2  . Smokeless tobacco: Never Used  Substance and Sexual Activity  . Alcohol use: No    Alcohol/week: 0.0 oz  . Drug use: No  . Sexual activity: Not Currently  Other Topics Concern  . Not on file  Social History Narrative   Retired Warden/ranger   widowed, lives with dtr and youngest son   5 kids from first marriage    Family History  Problem Relation Age of Onset  . Arthritis Mother   . Breast cancer Mother   . Arthritis Father   . Dementia Father   . Diabetes Other   . Arthritis Other   . Colon cancer Other   . Stroke Other   . Diabetes type II Sister   . Suicidality Brother   . Diabetes type I Brother   . Diabetes type I Brother   . Diabetes type I Brother   . Diabetes type II Sister   . Breast cancer Daughter   . Emphysema Daughter     Allergies  Allergen Reactions  . Lisinopril Cough    Outpatient Encounter Medications as of 10/04/2017  Medication Sig  . ACCU-CHEK SOFTCLIX LANCETS lancets USE AS INSTRUCTED WITH CHECKING BLOOD SUGAR THREE TIMES DAILY  . albuterol (PROVENTIL HFA;VENTOLIN HFA) 108 (90 Base) MCG/ACT inhaler Two puffs every 4-6 hours as needed for difficulty breathing,wheezing or cough  . Alcohol Swabs (B-D SINGLE USE SWABS REGULAR) PADS USE AS DIRECTED WITH  CHECKING BLOOD SUGAR THREE TIMES DAILY  . amLODipine (NORVASC) 5 MG tablet Take 1 tablet (5 mg total) by mouth daily.  [name] aspirin EC 81 MG tablet Take 1 tablet (81 mg total) by mouth daily.  . B-D UF III MINI PEN NEEDLES 31G X 5 MM MISC USE AS DIRECTED  . Blood Glucose Calibration (ACCU-CHEK AVIVA) SOLN Use as Directed. Dx: E11.51  . Blood Glucose Monitoring Suppl (ACCU-CHEK AVIVA PLUS) w/Device KIT Use as directed with checking blood sugar three times daily. Dx: E11.51  . escitalopram (LEXAPRO) 5 MG tablet Take one tablet by mouth  once daily  . fluticasone (FLONASE) 50 MCG/ACT nasal spray Place 1 spray into both nostrils daily.  . furosemide (LASIX) 20 MG tablet Take 1 tablet (20 mg total) by mouth daily as needed for edema.  [name] glucose blood (ACCU-CHEK AVIVA) test strip Use to test blood sugar three times daily. Dx: E11.51  . hydrochlorothiazide (HYDRODIURIL) 25 MG tablet Take 1 tablet (25 mg total) by mouth daily.  [place] Guise Devices (ACCU-CHEK SOFTCLIX) lancets Use as instructed with checking blood sugar three times daily. Dx: E11.51  . Loratadine 10 MG CAPS Take 1 capsule by mouth daily.  [name] losartan (COZAAR) 25 MG tablet Take 1 tablet (25 mg total) by mouth daily.  . Multiple Vitamins-Minerals (SENTRY SENIOR) TABS Take 1 tablet by mouth daily.  [name] NAMZARIC 28-10 MG CP24 TAKE 1 CAPSULE EVERY DAY  . simvastatin (ZOCOR) 20 MG tablet Take 1 tablet (20 mg total) by mouth daily.  . TOUJEO MAX SOLOSTAR 300 UNIT/ML SOPN INJECT 50 UNITS INTO THE SKIN DAILY.   No facility-administered encounter medications on file as of 10/04/2017.     Review of Systems:  Review of Systems  Unable to perform ROS: Dementia    Health Maintenance  Topic Date Due  . FOOT EXAM  04/02/2017  . HEMOGLOBIN A1C  04/02/2018  . OPHTHALMOLOGY EXAM  06/14/2018  . TETANUS/TDAP  07/29/2025  . INFLUENZA VACCINE  Completed  . PNA vac Low Risk Adult  Completed    Physical Exam: Vitals:   10/04/17 0823  BP: (!) 148/70    Pulse: 82  Temp: 98.7 F (37.1 C)  TempSrc: Oral  SpO2: 96%  Weight: 199 lb (90.3 kg)  Height: '5\' 11"'  (1.803 m)   Body mass index is 27.75 kg/m. Physical Exam  Constitutional: He is oriented to person, place, and time. He appears well-developed and well-nourished.  HENT:  Mouth/Throat: Oropharynx is clear and moist.  MMM; no oral thrush  Eyes: Pupils are equal, round, and reactive to light. No scleral icterus.  Neck: Neck supple. Carotid bruit is not present. No thyromegaly present.  Cardiovascular: Normal rate, regular rhythm and intact distal pulses. Exam reveals no gallop and no friction rub.  Murmur (1/6 SEM) heard. +2 pitting distal LE edema. No calf TTP but tight.  Pulmonary/Chest: Effort normal and breath sounds normal. He has no wheezes. He has no rales. He exhibits no tenderness.  Abdominal: Soft. Normal appearance and bowel sounds are normal. He exhibits no distension, no abdominal bruit, no pulsatile midline mass and no mass. There is no hepatomegaly. There is no tenderness. There is no rigidity, no rebound and no guarding. No hernia.  Musculoskeletal: He exhibits edema.  Lymphadenopathy:    He has no cervical adenopathy.  Neurological: He is alert and oriented to person, place, and time. He has normal reflexes.  Skin: Skin is warm and dry. No rash noted.  Feet have an odor  Psychiatric: He has a normal mood and affect. His behavior is normal. Thought content normal.   Diabetic Foot Exam - Simple   Simple Foot Form Diabetic Foot exam was performed with the following findings:  Yes 10/04/2017  9:01 AM  Visual Inspection See comments:  Yes Sensation Testing Intact to touch and monofilament testing bilaterally:  Yes Pulse Check Posterior Tibialis and Dorsalis pulse intact bilaterally:  Yes Comments +2 pitting LE edema b/l. Skin is scaly and cracked, dry;  B/l toenail hypertrophy noted; no ulcerations/calluses     Labs reviewed: Basic Metabolic Panel: Recent Labs  03/29/17 0812 06/29/17 0858 09/30/17 0810  NA 139 142 144  K 4.4 4.3 4.5  CL 98 100 102  CO2 27 37* 37*  GLUCOSE 185* 141* 84  BUN '16 16 18  ' CREATININE 1.17* 1.55* 1.41*  CALCIUM 10.3 9.7 9.9   Liver Function Tests: Recent Labs    12/10/16 0913 03/29/17 0812 06/29/17 0858 09/30/17 0810  AST 38* 28 31  --   ALT 38 25 32 20  ALKPHOS 60 52  --   --   BILITOT 0.4 0.6 0.5  --   PROT 6.7 6.9 6.9  --   ALBUMIN 4.2 4.5  --   --    No results for input(s): LIPASE, AMYLASE in the last 8760 hours. No results for input(s): AMMONIA in the last 8760 hours. CBC: No results for input(s): WBC, NEUTROABS, HGB, HCT, MCV, PLT in the last 8760 hours. Lipid Panel: Recent Labs    12/10/16 0913 03/29/17 0812 06/29/17 0858 09/30/17 0810  CHOL 171 171 151 147  HDL 34* 47 39* 36*  LDLCALC 94 90  --   --   TRIG 214* 170* 126 139  CHOLHDL 5.0* 3.6 3.9 4.1   Lab Results  Component Value Date   HGBA1C 8.3 (H) 09/30/2017    Procedures since last visit: Nm Pet Image Initial (pi) Skull Base To Thigh  Result Date: 09/20/2017 CLINICAL DATA:  Initial treatment strategy for right lung nodules. EXAM: NUCLEAR MEDICINE PET SKULL BASE TO THIGH TECHNIQUE: 10.0 mCi F-18 FDG was injected intravenously. Full-ring PET imaging was performed from the skull base to thigh after the radiotracer. CT data was obtained and used for attenuation correction and anatomic localization. FASTING BLOOD GLUCOSE:  Value: 103 mg/dl COMPARISON:  Chest CT on 08/04/2017 in 04/06/2017 FINDINGS: NECK: No hypermetabolic lymph nodes or masses. Muscular FDG uptake noted. CHEST: No hypermetabolic lymphadenopathy. Small right pleural effusion, without FDG uptake. An ovoid nodular density is seen along the right minor fissure which measures 17 mm in maximum diameter on image 34/8. This remains stable compared to previous studies, and is suspicious for a intrapulmonary lymph node. This shows mild FDG activity with SUV max of 1.3. Other  sub-cm pulmonary nodules in right upper and lower lobes are unchanged in size. These show no FDG uptake but are too small to characterize by PET. ABDOMEN/PELVIS: No abnormal hypermetabolic activity within the liver, pancreas, adrenal glands, or spleen. No hypermetabolic lymph nodes in the abdomen or pelvis. Mildly enlarged prostate gland shows focal FDG uptake in the right lateral peripheral zone with SUV max of 5.0. Bilateral renal and left hepatic lobe cysts. Aortic atherosclerosis. SKELETON: No focal hypermetabolic bone lesions to suggest skeletal metastasis. Chronic bilateral femoral head avascular necrosis noted. IMPRESSION: Stable 17 mm right lung nodule in the right minor fissure, which has features of a perifissural lymph node. This shows low-grade FDG uptake, which is nonspecific. Other sub-cm right lung nodules show no FDG uptake but are too small to characterize by PET. Recommend continued followup by chest CT without contrast in 6 months. Small right pleural effusion, without FDG uptake. Mildly enlarged prostate gland with focal FDG uptake. Differential diagnosis includes prostate carcinoma and prostatitis. Recommend urology consultation. Electronically Signed   By: Earle Gell M.D.   On: 09/20/2017 12:54    Assessment/Plan   ICD-10-CM   1. Type 2 diabetes mellitus with stage 3 chronic kidney disease, with long-term current use of insulin ([place]) E11.22 Ambulatory referral to Home Health   N18.3 Ambulatory referral  to Podiatry   Z79.4 CMP with eGFR([facility])    Hemoglobin A1c  2. Pulmonary nodules R91.8 CT CHEST NODULE FOLLOW UP LOW DOSE W/O  3. Prostate mass N42.9    with elevated PSA  4. Moderate dementia with behavioral disturbance F03.91 Ambulatory referral to Home Health  5. Bilateral lower extremity edema R60.0 Ambulatory referral to Home Health  6. Centrilobular emphysema ([place]) J43.2   7. Essential hypertension I10   8. Mixed hyperlipidemia E78.2 Lipid Panel    TSH  9. History of  stroke Z86.73      INCREASE INSULIN to 65 units daily due to elevated blood sugar  INCREASE LOSARTAN 50MG daily to protect kidneys from diabetes  Continue other medication as ordered  Follow up with specialists as scheduled  KEEP LEGS ELEVATED ON PILLOW WHEN SEATED IN RECLINER  Recommend shower at least 3 times per week to improve hygiene  Home health referral for home care aide to assist with bathing and other ADLs  Repeat low dose CT chest in 6 mos to follow lung nodules  Follow up in 3 mos for DM, emphysema, HTN, LE edema, CKD. Fasting labs prior to appt   Murphy S. Perlie Gold  [facility] [address] [place], [place] [address] [phone] Cell (Monday-Friday 8 AM - 5 PM) [phone] After 5 PM and follow prompts

## 2017-10-29 DIAGNOSIS — N189 Chronic kidney disease, unspecified: Secondary | ICD-10-CM | POA: Diagnosis not present

## 2017-10-29 DIAGNOSIS — Z794 Long term (current) use of insulin: Secondary | ICD-10-CM | POA: Diagnosis not present

## 2017-10-29 DIAGNOSIS — N183 Chronic kidney disease, stage 3 (moderate): Secondary | ICD-10-CM | POA: Diagnosis not present

## 2017-10-29 DIAGNOSIS — F0390 Unspecified dementia without behavioral disturbance: Secondary | ICD-10-CM | POA: Diagnosis not present

## 2017-10-29 DIAGNOSIS — I129 Hypertensive chronic kidney disease with stage 1 through stage 4 chronic kidney disease, or unspecified chronic kidney disease: Secondary | ICD-10-CM | POA: Diagnosis not present

## 2017-10-29 DIAGNOSIS — F0391 Unspecified dementia with behavioral disturbance: Secondary | ICD-10-CM | POA: Diagnosis not present

## 2017-10-29 DIAGNOSIS — M6281 Muscle weakness (generalized): Secondary | ICD-10-CM | POA: Diagnosis not present

## 2017-10-29 DIAGNOSIS — E1122 Type 2 diabetes mellitus with diabetic chronic kidney disease: Secondary | ICD-10-CM | POA: Diagnosis not present

## 2017-10-29 DIAGNOSIS — R609 Edema, unspecified: Secondary | ICD-10-CM | POA: Diagnosis not present

## 2017-10-31 DIAGNOSIS — E1122 Type 2 diabetes mellitus with diabetic chronic kidney disease: Secondary | ICD-10-CM | POA: Diagnosis not present

## 2017-10-31 DIAGNOSIS — F0390 Unspecified dementia without behavioral disturbance: Secondary | ICD-10-CM | POA: Diagnosis not present

## 2017-10-31 DIAGNOSIS — R609 Edema, unspecified: Secondary | ICD-10-CM | POA: Diagnosis not present

## 2017-10-31 DIAGNOSIS — Z794 Long term (current) use of insulin: Secondary | ICD-10-CM | POA: Diagnosis not present

## 2017-10-31 DIAGNOSIS — M6281 Muscle weakness (generalized): Secondary | ICD-10-CM | POA: Diagnosis not present

## 2017-10-31 DIAGNOSIS — N183 Chronic kidney disease, stage 3 (moderate): Secondary | ICD-10-CM | POA: Diagnosis not present

## 2017-10-31 DIAGNOSIS — I129 Hypertensive chronic kidney disease with stage 1 through stage 4 chronic kidney disease, or unspecified chronic kidney disease: Secondary | ICD-10-CM | POA: Diagnosis not present

## 2017-11-01 DIAGNOSIS — R609 Edema, unspecified: Secondary | ICD-10-CM | POA: Diagnosis not present

## 2017-11-01 DIAGNOSIS — N183 Chronic kidney disease, stage 3 (moderate): Secondary | ICD-10-CM | POA: Diagnosis not present

## 2017-11-01 DIAGNOSIS — I129 Hypertensive chronic kidney disease with stage 1 through stage 4 chronic kidney disease, or unspecified chronic kidney disease: Secondary | ICD-10-CM | POA: Diagnosis not present

## 2017-11-01 DIAGNOSIS — M6281 Muscle weakness (generalized): Secondary | ICD-10-CM | POA: Diagnosis not present

## 2017-11-01 DIAGNOSIS — F0390 Unspecified dementia without behavioral disturbance: Secondary | ICD-10-CM | POA: Diagnosis not present

## 2017-11-01 DIAGNOSIS — E1122 Type 2 diabetes mellitus with diabetic chronic kidney disease: Secondary | ICD-10-CM | POA: Diagnosis not present

## 2017-11-01 DIAGNOSIS — Z794 Long term (current) use of insulin: Secondary | ICD-10-CM | POA: Diagnosis not present

## 2017-11-03 DIAGNOSIS — Z794 Long term (current) use of insulin: Secondary | ICD-10-CM | POA: Diagnosis not present

## 2017-11-03 DIAGNOSIS — F0390 Unspecified dementia without behavioral disturbance: Secondary | ICD-10-CM | POA: Diagnosis not present

## 2017-11-03 DIAGNOSIS — M6281 Muscle weakness (generalized): Secondary | ICD-10-CM | POA: Diagnosis not present

## 2017-11-03 DIAGNOSIS — I129 Hypertensive chronic kidney disease with stage 1 through stage 4 chronic kidney disease, or unspecified chronic kidney disease: Secondary | ICD-10-CM | POA: Diagnosis not present

## 2017-11-03 DIAGNOSIS — E1122 Type 2 diabetes mellitus with diabetic chronic kidney disease: Secondary | ICD-10-CM | POA: Diagnosis not present

## 2017-11-03 DIAGNOSIS — N183 Chronic kidney disease, stage 3 (moderate): Secondary | ICD-10-CM | POA: Diagnosis not present

## 2017-11-03 DIAGNOSIS — R609 Edema, unspecified: Secondary | ICD-10-CM | POA: Diagnosis not present

## 2017-11-04 DIAGNOSIS — Z794 Long term (current) use of insulin: Secondary | ICD-10-CM | POA: Diagnosis not present

## 2017-11-04 DIAGNOSIS — R609 Edema, unspecified: Secondary | ICD-10-CM | POA: Diagnosis not present

## 2017-11-04 DIAGNOSIS — E1122 Type 2 diabetes mellitus with diabetic chronic kidney disease: Secondary | ICD-10-CM | POA: Diagnosis not present

## 2017-11-04 DIAGNOSIS — N183 Chronic kidney disease, stage 3 (moderate): Secondary | ICD-10-CM | POA: Diagnosis not present

## 2017-11-04 DIAGNOSIS — M6281 Muscle weakness (generalized): Secondary | ICD-10-CM | POA: Diagnosis not present

## 2017-11-04 DIAGNOSIS — I129 Hypertensive chronic kidney disease with stage 1 through stage 4 chronic kidney disease, or unspecified chronic kidney disease: Secondary | ICD-10-CM | POA: Diagnosis not present

## 2017-11-04 DIAGNOSIS — F0390 Unspecified dementia without behavioral disturbance: Secondary | ICD-10-CM | POA: Diagnosis not present

## 2017-11-07 DIAGNOSIS — M6281 Muscle weakness (generalized): Secondary | ICD-10-CM | POA: Diagnosis not present

## 2017-11-07 DIAGNOSIS — R609 Edema, unspecified: Secondary | ICD-10-CM | POA: Diagnosis not present

## 2017-11-07 DIAGNOSIS — I129 Hypertensive chronic kidney disease with stage 1 through stage 4 chronic kidney disease, or unspecified chronic kidney disease: Secondary | ICD-10-CM | POA: Diagnosis not present

## 2017-11-07 DIAGNOSIS — Z794 Long term (current) use of insulin: Secondary | ICD-10-CM | POA: Diagnosis not present

## 2017-11-07 DIAGNOSIS — F0390 Unspecified dementia without behavioral disturbance: Secondary | ICD-10-CM | POA: Diagnosis not present

## 2017-11-07 DIAGNOSIS — E1122 Type 2 diabetes mellitus with diabetic chronic kidney disease: Secondary | ICD-10-CM | POA: Diagnosis not present

## 2017-11-07 DIAGNOSIS — N183 Chronic kidney disease, stage 3 (moderate): Secondary | ICD-10-CM | POA: Diagnosis not present

## 2017-11-09 DIAGNOSIS — M6281 Muscle weakness (generalized): Secondary | ICD-10-CM | POA: Diagnosis not present

## 2017-11-09 DIAGNOSIS — N183 Chronic kidney disease, stage 3 (moderate): Secondary | ICD-10-CM | POA: Diagnosis not present

## 2017-11-09 DIAGNOSIS — Z794 Long term (current) use of insulin: Secondary | ICD-10-CM | POA: Diagnosis not present

## 2017-11-09 DIAGNOSIS — R609 Edema, unspecified: Secondary | ICD-10-CM | POA: Diagnosis not present

## 2017-11-09 DIAGNOSIS — I129 Hypertensive chronic kidney disease with stage 1 through stage 4 chronic kidney disease, or unspecified chronic kidney disease: Secondary | ICD-10-CM | POA: Diagnosis not present

## 2017-11-09 DIAGNOSIS — E1122 Type 2 diabetes mellitus with diabetic chronic kidney disease: Secondary | ICD-10-CM | POA: Diagnosis not present

## 2017-11-09 DIAGNOSIS — F0390 Unspecified dementia without behavioral disturbance: Secondary | ICD-10-CM | POA: Diagnosis not present

## 2017-11-10 DIAGNOSIS — N183 Chronic kidney disease, stage 3 (moderate): Secondary | ICD-10-CM | POA: Diagnosis not present

## 2017-11-10 DIAGNOSIS — E1122 Type 2 diabetes mellitus with diabetic chronic kidney disease: Secondary | ICD-10-CM | POA: Diagnosis not present

## 2017-11-10 DIAGNOSIS — R609 Edema, unspecified: Secondary | ICD-10-CM | POA: Diagnosis not present

## 2017-11-10 DIAGNOSIS — I129 Hypertensive chronic kidney disease with stage 1 through stage 4 chronic kidney disease, or unspecified chronic kidney disease: Secondary | ICD-10-CM | POA: Diagnosis not present

## 2017-11-10 DIAGNOSIS — M6281 Muscle weakness (generalized): Secondary | ICD-10-CM | POA: Diagnosis not present

## 2017-11-10 DIAGNOSIS — F0390 Unspecified dementia without behavioral disturbance: Secondary | ICD-10-CM | POA: Diagnosis not present

## 2017-11-10 DIAGNOSIS — Z794 Long term (current) use of insulin: Secondary | ICD-10-CM | POA: Diagnosis not present

## 2017-11-11 DIAGNOSIS — E1122 Type 2 diabetes mellitus with diabetic chronic kidney disease: Secondary | ICD-10-CM | POA: Diagnosis not present

## 2017-11-11 DIAGNOSIS — N183 Chronic kidney disease, stage 3 (moderate): Secondary | ICD-10-CM | POA: Diagnosis not present

## 2017-11-11 DIAGNOSIS — M6281 Muscle weakness (generalized): Secondary | ICD-10-CM | POA: Diagnosis not present

## 2017-11-11 DIAGNOSIS — F0390 Unspecified dementia without behavioral disturbance: Secondary | ICD-10-CM | POA: Diagnosis not present

## 2017-11-11 DIAGNOSIS — I129 Hypertensive chronic kidney disease with stage 1 through stage 4 chronic kidney disease, or unspecified chronic kidney disease: Secondary | ICD-10-CM | POA: Diagnosis not present

## 2017-11-11 DIAGNOSIS — Z794 Long term (current) use of insulin: Secondary | ICD-10-CM | POA: Diagnosis not present

## 2017-11-11 DIAGNOSIS — R609 Edema, unspecified: Secondary | ICD-10-CM | POA: Diagnosis not present

## 2017-11-14 DIAGNOSIS — M6281 Muscle weakness (generalized): Secondary | ICD-10-CM | POA: Diagnosis not present

## 2017-11-14 DIAGNOSIS — E1122 Type 2 diabetes mellitus with diabetic chronic kidney disease: Secondary | ICD-10-CM | POA: Diagnosis not present

## 2017-11-14 DIAGNOSIS — N183 Chronic kidney disease, stage 3 (moderate): Secondary | ICD-10-CM | POA: Diagnosis not present

## 2017-11-14 DIAGNOSIS — R609 Edema, unspecified: Secondary | ICD-10-CM | POA: Diagnosis not present

## 2017-11-14 DIAGNOSIS — Z794 Long term (current) use of insulin: Secondary | ICD-10-CM | POA: Diagnosis not present

## 2017-11-14 DIAGNOSIS — I129 Hypertensive chronic kidney disease with stage 1 through stage 4 chronic kidney disease, or unspecified chronic kidney disease: Secondary | ICD-10-CM | POA: Diagnosis not present

## 2017-11-14 DIAGNOSIS — F0390 Unspecified dementia without behavioral disturbance: Secondary | ICD-10-CM | POA: Diagnosis not present

## 2017-11-15 DIAGNOSIS — E1122 Type 2 diabetes mellitus with diabetic chronic kidney disease: Secondary | ICD-10-CM | POA: Diagnosis not present

## 2017-11-15 DIAGNOSIS — N183 Chronic kidney disease, stage 3 (moderate): Secondary | ICD-10-CM | POA: Diagnosis not present

## 2017-11-15 DIAGNOSIS — M6281 Muscle weakness (generalized): Secondary | ICD-10-CM | POA: Diagnosis not present

## 2017-11-15 DIAGNOSIS — F0390 Unspecified dementia without behavioral disturbance: Secondary | ICD-10-CM | POA: Diagnosis not present

## 2017-11-15 DIAGNOSIS — I129 Hypertensive chronic kidney disease with stage 1 through stage 4 chronic kidney disease, or unspecified chronic kidney disease: Secondary | ICD-10-CM | POA: Diagnosis not present

## 2017-11-15 DIAGNOSIS — R609 Edema, unspecified: Secondary | ICD-10-CM | POA: Diagnosis not present

## 2017-11-15 DIAGNOSIS — Z794 Long term (current) use of insulin: Secondary | ICD-10-CM | POA: Diagnosis not present

## 2017-11-16 ENCOUNTER — Encounter: Payer: Self-pay | Admitting: Podiatry

## 2017-11-16 ENCOUNTER — Ambulatory Visit: Payer: Medicare HMO | Admitting: Podiatry

## 2017-11-16 ENCOUNTER — Telehealth: Payer: Self-pay | Admitting: *Deleted

## 2017-11-16 DIAGNOSIS — R609 Edema, unspecified: Secondary | ICD-10-CM | POA: Diagnosis not present

## 2017-11-16 DIAGNOSIS — I129 Hypertensive chronic kidney disease with stage 1 through stage 4 chronic kidney disease, or unspecified chronic kidney disease: Secondary | ICD-10-CM | POA: Diagnosis not present

## 2017-11-16 DIAGNOSIS — M79675 Pain in left toe(s): Secondary | ICD-10-CM | POA: Diagnosis not present

## 2017-11-16 DIAGNOSIS — M79674 Pain in right toe(s): Secondary | ICD-10-CM

## 2017-11-16 DIAGNOSIS — B351 Tinea unguium: Secondary | ICD-10-CM | POA: Diagnosis not present

## 2017-11-16 DIAGNOSIS — E1159 Type 2 diabetes mellitus with other circulatory complications: Secondary | ICD-10-CM | POA: Diagnosis not present

## 2017-11-16 DIAGNOSIS — M6281 Muscle weakness (generalized): Secondary | ICD-10-CM | POA: Diagnosis not present

## 2017-11-16 DIAGNOSIS — E1122 Type 2 diabetes mellitus with diabetic chronic kidney disease: Secondary | ICD-10-CM | POA: Diagnosis not present

## 2017-11-16 DIAGNOSIS — F0390 Unspecified dementia without behavioral disturbance: Secondary | ICD-10-CM | POA: Diagnosis not present

## 2017-11-16 DIAGNOSIS — Z794 Long term (current) use of insulin: Secondary | ICD-10-CM | POA: Diagnosis not present

## 2017-11-16 DIAGNOSIS — N183 Chronic kidney disease, stage 3 (moderate): Secondary | ICD-10-CM | POA: Diagnosis not present

## 2017-11-16 NOTE — Telephone Encounter (Signed)
[name], wife called and left message on clinical intake and requested order for speech therapy. Stated that [name] comes to the home.   I called back and LMOM informing her that [name] would need to call us and request an order for speech therapy since they evaluate patient.

## 2017-11-16 NOTE — Progress Notes (Signed)
This patient presents to the office with chief complaint of long thick nails and diabetic feet.   He was referred to this office by Dr.  Gildardo Cranker. This patient  says he is having no pain and discomfort in his feet.  This patient says he has long thick painful nails.  These nails are painful walking and wearing shoes.  He has no history of infection or drainage from both feet.  This patient presents the office today for treatment of the  long nails and a foot evaluation due to history of  Diabetes. He is accompanied by his daughter.  General Appearance  Alert, conversant and in no acute stress.  Vascular  Dorsalis pedis   pulses are palpable  bilaterally.  Posterior tibial pulses are not palpable  B/L secondary to swelling. Capillary return is within normal limits  bilaterally. Temperature is within normal limits  bilaterally.  Neurologic  Senn-Weinstein monofilament wire test within normal limits  bilaterally. Muscle power within normal limits bilaterally. Daughter said he was previously tested.  Nails Thick disfigured discolored nails with subungual debris  from hallux to fifth toes bilaterally. No evidence of bacterial infection or drainage bilaterally.  Orthopedic  No limitations of motion of motion feet .  No crepitus or effusions noted.  No bony pathology or digital deformities noted.  Skin  normotropic skin with no porokeratosis noted bilaterally.  No signs of infections or ulcers noted.     Onychomycosis  Diabetes with vascular disease.  IE  Debride nails x 10.  A diabetic foot exam was performed and there is no evidence of any  foot pathology.   RTC 3 months.   Gardiner Barefoot DPM

## 2017-11-18 DIAGNOSIS — M6281 Muscle weakness (generalized): Secondary | ICD-10-CM | POA: Diagnosis not present

## 2017-11-18 DIAGNOSIS — I129 Hypertensive chronic kidney disease with stage 1 through stage 4 chronic kidney disease, or unspecified chronic kidney disease: Secondary | ICD-10-CM | POA: Diagnosis not present

## 2017-11-18 DIAGNOSIS — R609 Edema, unspecified: Secondary | ICD-10-CM | POA: Diagnosis not present

## 2017-11-18 DIAGNOSIS — Z794 Long term (current) use of insulin: Secondary | ICD-10-CM | POA: Diagnosis not present

## 2017-11-18 DIAGNOSIS — N183 Chronic kidney disease, stage 3 (moderate): Secondary | ICD-10-CM | POA: Diagnosis not present

## 2017-11-18 DIAGNOSIS — E1122 Type 2 diabetes mellitus with diabetic chronic kidney disease: Secondary | ICD-10-CM | POA: Diagnosis not present

## 2017-11-18 DIAGNOSIS — F0390 Unspecified dementia without behavioral disturbance: Secondary | ICD-10-CM | POA: Diagnosis not present

## 2017-11-19 DIAGNOSIS — Z794 Long term (current) use of insulin: Secondary | ICD-10-CM | POA: Diagnosis not present

## 2017-11-19 DIAGNOSIS — M6281 Muscle weakness (generalized): Secondary | ICD-10-CM | POA: Diagnosis not present

## 2017-11-19 DIAGNOSIS — E1122 Type 2 diabetes mellitus with diabetic chronic kidney disease: Secondary | ICD-10-CM | POA: Diagnosis not present

## 2017-11-19 DIAGNOSIS — N183 Chronic kidney disease, stage 3 (moderate): Secondary | ICD-10-CM | POA: Diagnosis not present

## 2017-11-19 DIAGNOSIS — F0390 Unspecified dementia without behavioral disturbance: Secondary | ICD-10-CM | POA: Diagnosis not present

## 2017-11-19 DIAGNOSIS — I129 Hypertensive chronic kidney disease with stage 1 through stage 4 chronic kidney disease, or unspecified chronic kidney disease: Secondary | ICD-10-CM | POA: Diagnosis not present

## 2017-11-19 DIAGNOSIS — R609 Edema, unspecified: Secondary | ICD-10-CM | POA: Diagnosis not present

## 2017-11-24 DIAGNOSIS — R609 Edema, unspecified: Secondary | ICD-10-CM | POA: Diagnosis not present

## 2017-11-24 DIAGNOSIS — N183 Chronic kidney disease, stage 3 (moderate): Secondary | ICD-10-CM | POA: Diagnosis not present

## 2017-11-24 DIAGNOSIS — I129 Hypertensive chronic kidney disease with stage 1 through stage 4 chronic kidney disease, or unspecified chronic kidney disease: Secondary | ICD-10-CM | POA: Diagnosis not present

## 2017-11-24 DIAGNOSIS — E1122 Type 2 diabetes mellitus with diabetic chronic kidney disease: Secondary | ICD-10-CM | POA: Diagnosis not present

## 2017-11-24 DIAGNOSIS — F0390 Unspecified dementia without behavioral disturbance: Secondary | ICD-10-CM | POA: Diagnosis not present

## 2017-11-24 DIAGNOSIS — Z794 Long term (current) use of insulin: Secondary | ICD-10-CM | POA: Diagnosis not present

## 2017-11-24 DIAGNOSIS — M6281 Muscle weakness (generalized): Secondary | ICD-10-CM | POA: Diagnosis not present

## 2017-11-30 DIAGNOSIS — I129 Hypertensive chronic kidney disease with stage 1 through stage 4 chronic kidney disease, or unspecified chronic kidney disease: Secondary | ICD-10-CM | POA: Diagnosis not present

## 2017-11-30 DIAGNOSIS — R609 Edema, unspecified: Secondary | ICD-10-CM | POA: Diagnosis not present

## 2017-11-30 DIAGNOSIS — N183 Chronic kidney disease, stage 3 (moderate): Secondary | ICD-10-CM | POA: Diagnosis not present

## 2017-11-30 DIAGNOSIS — E1122 Type 2 diabetes mellitus with diabetic chronic kidney disease: Secondary | ICD-10-CM | POA: Diagnosis not present

## 2017-11-30 DIAGNOSIS — M6281 Muscle weakness (generalized): Secondary | ICD-10-CM | POA: Diagnosis not present

## 2017-11-30 DIAGNOSIS — Z794 Long term (current) use of insulin: Secondary | ICD-10-CM | POA: Diagnosis not present

## 2017-11-30 DIAGNOSIS — F0390 Unspecified dementia without behavioral disturbance: Secondary | ICD-10-CM | POA: Diagnosis not present

## 2017-12-01 DIAGNOSIS — E1122 Type 2 diabetes mellitus with diabetic chronic kidney disease: Secondary | ICD-10-CM | POA: Diagnosis not present

## 2017-12-01 DIAGNOSIS — R609 Edema, unspecified: Secondary | ICD-10-CM | POA: Diagnosis not present

## 2017-12-01 DIAGNOSIS — F0390 Unspecified dementia without behavioral disturbance: Secondary | ICD-10-CM | POA: Diagnosis not present

## 2017-12-01 DIAGNOSIS — N183 Chronic kidney disease, stage 3 (moderate): Secondary | ICD-10-CM | POA: Diagnosis not present

## 2017-12-01 DIAGNOSIS — M6281 Muscle weakness (generalized): Secondary | ICD-10-CM | POA: Diagnosis not present

## 2017-12-01 DIAGNOSIS — I129 Hypertensive chronic kidney disease with stage 1 through stage 4 chronic kidney disease, or unspecified chronic kidney disease: Secondary | ICD-10-CM | POA: Diagnosis not present

## 2017-12-01 DIAGNOSIS — Z794 Long term (current) use of insulin: Secondary | ICD-10-CM | POA: Diagnosis not present

## 2017-12-06 DIAGNOSIS — R609 Edema, unspecified: Secondary | ICD-10-CM | POA: Diagnosis not present

## 2017-12-06 DIAGNOSIS — E1122 Type 2 diabetes mellitus with diabetic chronic kidney disease: Secondary | ICD-10-CM | POA: Diagnosis not present

## 2017-12-06 DIAGNOSIS — F0390 Unspecified dementia without behavioral disturbance: Secondary | ICD-10-CM | POA: Diagnosis not present

## 2017-12-06 DIAGNOSIS — M6281 Muscle weakness (generalized): Secondary | ICD-10-CM | POA: Diagnosis not present

## 2017-12-06 DIAGNOSIS — Z794 Long term (current) use of insulin: Secondary | ICD-10-CM | POA: Diagnosis not present

## 2017-12-06 DIAGNOSIS — I129 Hypertensive chronic kidney disease with stage 1 through stage 4 chronic kidney disease, or unspecified chronic kidney disease: Secondary | ICD-10-CM | POA: Diagnosis not present

## 2017-12-06 DIAGNOSIS — N183 Chronic kidney disease, stage 3 (moderate): Secondary | ICD-10-CM | POA: Diagnosis not present

## 2017-12-15 DIAGNOSIS — R609 Edema, unspecified: Secondary | ICD-10-CM | POA: Diagnosis not present

## 2017-12-15 DIAGNOSIS — E1122 Type 2 diabetes mellitus with diabetic chronic kidney disease: Secondary | ICD-10-CM | POA: Diagnosis not present

## 2017-12-15 DIAGNOSIS — M6281 Muscle weakness (generalized): Secondary | ICD-10-CM | POA: Diagnosis not present

## 2017-12-15 DIAGNOSIS — F0390 Unspecified dementia without behavioral disturbance: Secondary | ICD-10-CM | POA: Diagnosis not present

## 2017-12-15 DIAGNOSIS — N183 Chronic kidney disease, stage 3 (moderate): Secondary | ICD-10-CM | POA: Diagnosis not present

## 2017-12-15 DIAGNOSIS — I129 Hypertensive chronic kidney disease with stage 1 through stage 4 chronic kidney disease, or unspecified chronic kidney disease: Secondary | ICD-10-CM | POA: Diagnosis not present

## 2017-12-15 DIAGNOSIS — Z794 Long term (current) use of insulin: Secondary | ICD-10-CM | POA: Diagnosis not present

## 2017-12-27 ENCOUNTER — Other Ambulatory Visit: Payer: Self-pay | Admitting: *Deleted

## 2017-12-27 DIAGNOSIS — I10 Essential (primary) hypertension: Secondary | ICD-10-CM

## 2017-12-27 MED ORDER — SIMVASTATIN 20 MG PO TABS: 20.0000 mg | ORAL_TABLET | Freq: Every day | ORAL | 1 refills | Status: DC

## 2017-12-27 MED ORDER — AMLODIPINE BESYLATE 5 MG PO TABS: 5.0000 mg | ORAL_TABLET | Freq: Every day | ORAL | 1 refills | Status: DC

## 2017-12-27 MED ORDER — LOSARTAN POTASSIUM 25 MG PO TABS: 25.0000 mg | ORAL_TABLET | Freq: Every day | ORAL | 1 refills | Status: DC

## 2017-12-27 NOTE — Telephone Encounter (Signed)
[facility] 

## 2017-12-28 ENCOUNTER — Other Ambulatory Visit: Payer: Self-pay | Admitting: *Deleted

## 2017-12-28 DIAGNOSIS — F0391 Unspecified dementia with behavioral disturbance: Secondary | ICD-10-CM

## 2017-12-28 DIAGNOSIS — F03B18 Unspecified dementia, moderate, with other behavioral disturbance: Secondary | ICD-10-CM

## 2017-12-28 MED ORDER — MEMANTINE HCL-DONEPEZIL HCL ER 28-10 MG PO CP24: 1.0000 | ORAL_CAPSULE | Freq: Every day | ORAL | 1 refills | Status: DC

## 2017-12-28 NOTE — Telephone Encounter (Signed)
[facility] 

## 2018-01-02 ENCOUNTER — Encounter: Payer: Self-pay | Admitting: Internal Medicine

## 2018-01-06 ENCOUNTER — Ambulatory Visit: Payer: Medicare HMO | Admitting: Internal Medicine

## 2018-01-13 ENCOUNTER — Other Ambulatory Visit: Payer: Medicare HMO

## 2018-01-16 ENCOUNTER — Other Ambulatory Visit: Payer: Medicare HMO

## 2018-01-16 DIAGNOSIS — N183 Chronic kidney disease, stage 3 (moderate): Secondary | ICD-10-CM | POA: Diagnosis not present

## 2018-01-16 DIAGNOSIS — Z794 Long term (current) use of insulin: Secondary | ICD-10-CM | POA: Diagnosis not present

## 2018-01-16 DIAGNOSIS — E782 Mixed hyperlipidemia: Secondary | ICD-10-CM | POA: Diagnosis not present

## 2018-01-16 DIAGNOSIS — E1122 Type 2 diabetes mellitus with diabetic chronic kidney disease: Secondary | ICD-10-CM | POA: Diagnosis not present

## 2018-01-18 ENCOUNTER — Ambulatory Visit: Payer: Medicare HMO | Admitting: Internal Medicine

## 2018-01-20 ENCOUNTER — Encounter: Payer: Self-pay | Admitting: Internal Medicine

## 2018-01-20 ENCOUNTER — Ambulatory Visit: Payer: Medicare HMO | Admitting: Internal Medicine

## 2018-01-20 VITALS — BP 158/80 | HR 80 | Temp 98.8°F | Ht 71.0 in | Wt 189.8 lb

## 2018-01-20 DIAGNOSIS — Z8673 Personal history of transient ischemic attack (TIA), and cerebral infarction without residual deficits: Secondary | ICD-10-CM | POA: Diagnosis not present

## 2018-01-20 DIAGNOSIS — R600 Localized edema: Secondary | ICD-10-CM | POA: Diagnosis not present

## 2018-01-20 DIAGNOSIS — N400 Enlarged prostate without lower urinary tract symptoms: Secondary | ICD-10-CM

## 2018-01-20 DIAGNOSIS — J441 Chronic obstructive pulmonary disease with (acute) exacerbation: Secondary | ICD-10-CM

## 2018-01-20 DIAGNOSIS — N183 Chronic kidney disease, stage 3 (moderate): Secondary | ICD-10-CM

## 2018-01-20 DIAGNOSIS — R634 Abnormal weight loss: Secondary | ICD-10-CM | POA: Diagnosis not present

## 2018-01-20 DIAGNOSIS — J432 Centrilobular emphysema: Secondary | ICD-10-CM

## 2018-01-20 DIAGNOSIS — I10 Essential (primary) hypertension: Secondary | ICD-10-CM

## 2018-01-20 DIAGNOSIS — E1122 Type 2 diabetes mellitus with diabetic chronic kidney disease: Secondary | ICD-10-CM

## 2018-01-20 DIAGNOSIS — Z794 Long term (current) use of insulin: Secondary | ICD-10-CM

## 2018-01-20 DIAGNOSIS — E782 Mixed hyperlipidemia: Secondary | ICD-10-CM

## 2018-01-20 DIAGNOSIS — R918 Other nonspecific abnormal finding of lung field: Secondary | ICD-10-CM | POA: Diagnosis not present

## 2018-01-20 DIAGNOSIS — F0391 Unspecified dementia with behavioral disturbance: Secondary | ICD-10-CM | POA: Diagnosis not present

## 2018-01-20 DIAGNOSIS — F03B18 Unspecified dementia, moderate, with other behavioral disturbance: Secondary | ICD-10-CM

## 2018-01-20 MED ORDER — AZITHROMYCIN 250 MG PO TABS: ORAL_TABLET | ORAL | 0 refills | Status: DC

## 2018-01-20 MED ORDER — SACCHAROMYCES BOULARDII 250 MG PO CAPS: 250.0000 mg | ORAL_CAPSULE | Freq: Two times a day (BID) | ORAL | 0 refills | Status: DC

## 2018-01-20 NOTE — Progress Notes (Signed)
Patient ID: Kazden Largo, male   DOB: [birth year], 84 y.o.   MRN: [id number]   Location:  [facility] OFFICE  Provider: DR Arletha Grippe  Code Status:  Goals of Care:  Advanced Directives 02/22/2017  Does Patient Have a Medical Advance Directive? Yes  Type of Paramedic of [place];Living will  Does patient want to make changes to medical advance directive? No - Patient declined  Copy of [place] in Chart? No - copy requested  Would patient like information on creating a medical advance directive? -     Chief Complaint  Patient presents with  . Medical Management of Chronic Issues    Patient here today with his daughter [name]. Would like to see another doctor in [place] for his prostate checks.     HPI: Patient is a 84 y.o. male seen today for medical management of chronic diseases.  He has a cough this week that is productive. No SOB or CP. No f/c. Appetite is good but he has lost 10 lbs since March 2019. BS <200  Daughter c/a prostate mass and PSA results. Pt was seen by urology Dr Felicie Morn last week. Results have not been called to her yet. Per care everywhere, PSA >7.   Pulmonary nodules - Cough has resolved and daughter reports no chest congestion. Denies hemoptysis. CT chest in Jan 2019 revealed multiple right sided pulmonary nodules and PET scan recommended. He had a PET scan in Feb 2019 that revealed right minor fissure ovoid nodular density 17 mm suspicious for intrapulmonary lymph node; several other subcm nodules in RUL and RLL; no FDG uptake; small right pleural effusion; mildly enlarged prostate with FDG uptake at right lateral peripheral zone; b/l renal and left hepatic lobe cysts; aortic atherosclerosis.  Prostate mass - noted on PET scan in Feb 2019 (mildly enlarged prostate with FDG uptake at right lateral peripheral zone). Followed by urology Dr Felicie Morn.   DM - A1c 8.3% (prev 9.8%). His daughter is giving him 60 units Toujeo due  to elevated BS. Off metformin due to worsening renal fxn. BS at home <200 now. No low BS reactions. No numbness/tingling in hands/feet. Urine microalbumin/cr ratio 232 (prev 33). He gets a cough with lisinopril. Currently taking losartan 84m daily. LDL 87  HTN/CAD - s/p DES in 2014. BP stable on losartan, amlodipine and HCTZ. Takes ASA daily. He was seen by cardio Dr STamala Julianin July 2018 and told to f/u prn. LDL 94. 2D echo in Feb 2017 showed EF 65-70% and no wall motion abnormalities but does have grade 1 DD;mild-mod AI; mild MR. He has chronic LE swelling and has gained 10 lbs since Aug 2018.  Dementia with behavioral disturbance - takes namzaric for cognition and no significant changes. He takes lexapro for depression. Albumin 4.5. Weight down 10 lbs in last 3 mos. MMSE 16/30. Followed by neurology Dr ADelice Lesch Weight loss is to be expected with end stage dementia.  Hyperlipidemia - stable on simvastatin. No myalgias. LDL 87; HDL 36; TG 139.   Depression - stable on lexapro. No behavioral changes  Gl[facility] - stable. [facility] Currently not on meds. No change in vision  Arthritis - stable pain. No worsening swelling  CKD - stage 3. Cr 1.41 (prev 1.55)  Hx CVA - speech is occasionally garbled and he has unsteady gait. He has been falling more. He takes ASA daily  Seasonal allergy - overall stable on claritin but does have  breakthrough rhinorrhea    Past Medical History:  Diagnosis Date  . Arthritis   . CAD (coronary artery disease)    s/p DES to LAD 06/2013  . Depression   . Dyslipidemia   . Glaucoma   . Hyperglycemia 04/17/2014  . Hyperlipidemia   . Hypertension   . Stroke ([place])   . Type 2 diabetes mellitus ([employer])     Past Surgical History:  Procedure Laterality Date  . CARDIAC CATHETERIZATION  06/2013   DES to LAD by Dr Tamala Julian  . Right Cataract       reports that he quit smoking about 49 years ago. His smoking use included  cigarettes. He has never used smokeless tobacco. He reports that he does not drink alcohol or use drugs. Social History   Socioeconomic History  . Marital status: Widowed    Spouse name: Not on file  . Number of children: 4  . Years of education: Not on file  . Highest education level: Not on file  Occupational History  . Occupation: Retired  Scientific laboratory technician  . Financial resource strain: Not on file  . Food insecurity:    Worry: Not on file    Inability: Not on file  . Transportation needs:    Medical: Not on file    Non-medical: Not on file  Tobacco Use  . Smoking status: Former Smoker    Types: Cigarettes    Last attempt to quit: 08/02/1968    Years since quitting: 49.5  . Smokeless tobacco: Never Used  Substance and Sexual Activity  . Alcohol use: No    Alcohol/week: 0.0 oz  . Drug use: No  . Sexual activity: Not Currently  Lifestyle  . Physical activity:    Days per week: Not on file    Minutes per session: Not on file  . Stress: Not on file  Relationships  . Social connections:    Talks on phone: Not on file    Gets together: Not on file    Attends religious service: Not on file    Active member of club or organization: Not on file    Attends meetings of clubs or organizations: Not on file    Relationship status: Not on file  . Intimate partner violence:    Fear of current or ex partner: Not on file    Emotionally abused: Not on file    Physically abused: Not on file    Forced sexual activity: Not on file  Other Topics Concern  . Not on file  Social History Narrative   Retired Warden/ranger   widowed, lives with dtr and youngest son   5 kids from first marriage    Family History  Problem Relation Age of Onset  . Arthritis Mother   . Breast cancer Mother   . Arthritis Father   . Dementia Father   . Diabetes Other   . Arthritis Other   . Colon cancer Other   . Stroke Other   . Diabetes type II Sister   . Suicidality Brother   . Diabetes type I  Brother   . Diabetes type I Brother   . Diabetes type I Brother   . Diabetes type II Sister   . Breast cancer Daughter   . Emphysema Daughter     Allergies  Allergen Reactions  . Lisinopril Cough    Outpatient Encounter Medications as of 01/20/2018  Medication Sig  . ACCU-CHEK SOFTCLIX LANCETS lancets USE AS INSTRUCTED WITH CHECKING BLOOD SUGAR  THREE TIMES DAILY  . albuterol (PROVENTIL HFA;VENTOLIN HFA) 108 (90 Base) MCG/ACT inhaler Two puffs every 4-6 hours as needed for difficulty breathing,wheezing or cough  . Alcohol Swabs (B-D SINGLE USE SWABS REGULAR) PADS USE AS DIRECTED WITH CHECKING BLOOD SUGAR THREE TIMES DAILY  . amLODipine (NORVASC) 5 MG tablet Take 1 tablet (5 mg total) by mouth daily.  [name] aspirin EC 81 MG tablet Take 1 tablet (81 mg total) by mouth daily.  . B-D UF III MINI PEN NEEDLES 31G X 5 MM MISC USE AS DIRECTED  . Blood Glucose Calibration (ACCU-CHEK AVIVA) SOLN Use as Directed. Dx: E11.51  . Blood Glucose Monitoring Suppl (ACCU-CHEK AVIVA PLUS) w/Device KIT Use as directed with checking blood sugar three times daily. Dx: E11.51  . escitalopram (LEXAPRO) 5 MG tablet Take one tablet by mouth once daily  . fluticasone (FLONASE) 50 MCG/ACT nasal spray Place 1 spray into both nostrils daily.  . furosemide (LASIX) 20 MG tablet Take 1 tablet (20 mg total) by mouth daily as needed for edema.  [name] glucose blood (ACCU-CHEK AVIVA) test strip Use to test blood sugar three times daily. Dx: E11.51  . hydrochlorothiazide (HYDRODIURIL) 25 MG tablet Take 1 tablet (25 mg total) by mouth daily.  Elmore Guise Devices (ACCU-CHEK SOFTCLIX) lancets Use as instructed with checking blood sugar three times daily. Dx: E11.51  . Loratadine 10 MG CAPS Take 1 capsule by mouth daily.  [name] losartan (COZAAR) 25 MG tablet Take 1 tablet (25 mg total) by mouth daily.  . Memantine HCl-Donepezil HCl (NAMZARIC) 28-10 MG CP24 Take 1 capsule by mouth daily.  . Multiple Vitamins-Minerals (SENTRY SENIOR) TABS Take 1  tablet by mouth daily.  . simvastatin (ZOCOR) 20 MG tablet Take 1 tablet (20 mg total) by mouth daily.  . TOUJEO MAX SOLOSTAR 300 UNIT/ML SOPN INJECT 50 UNITS INTO THE SKIN DAILY.   No facility-administered encounter medications on file as of 01/20/2018.     Review of Systems:  Review of Systems  Unable to perform ROS: Dementia    Health Maintenance  Topic Date Due  . INFLUENZA VACCINE  03/02/2018  . OPHTHALMOLOGY EXAM  06/14/2018  . HEMOGLOBIN A1C  07/18/2018  . FOOT EXAM  10/05/2018  . TETANUS/TDAP  07/29/2025  . PNA vac Low Risk Adult  Completed    Physical Exam: Vitals:   01/20/18 1214  BP: (!) 158/80  Pulse: 80  Temp: 98.8 F (37.1 C)  SpO2: 93%  Weight: 189 lb 12.8 oz (86.1 kg)  Height: _0  (1.803 m)   Body mass index is 26.47 kg/m. Physical Exam  Constitutional: He appears well-developed and well-nourished.  HENT:  Mouth/Throat: Oropharynx is clear and moist.  MMM; no oral thrush  Eyes: Pupils are equal, round, and reactive to light. No scleral icterus.  Neck: Neck supple. Carotid bruit is not present. No thyromegaly present.  Cardiovascular: Normal rate, regular rhythm and intact distal pulses. Exam reveals no gallop and no friction rub.  Murmur (1/6 SEM) heard. Chronic b/l LE venous stasis changes; no calf TTP  Pulmonary/Chest: Effort normal. He has wheezes (end expiratory b/l with prolonged expiratory phase). He has no rales. He exhibits no tenderness.  Abdominal: Soft. Bowel sounds are normal. He exhibits no distension, no abdominal bruit, no pulsatile midline mass and no mass. There is no hepatomegaly. There is no tenderness. There is no rebound and no guarding.  Musculoskeletal: He exhibits edema.  Lymphadenopathy:    He has no cervical adenopathy.  Neurological: He is  alert. He has normal reflexes.  Skin: Skin is warm and dry. No rash noted.  Psychiatric: He has a normal mood and affect. His behavior is normal. Thought content is delusional.  He  thinks he's wearing white today    Labs reviewed: Basic Metabolic Panel: Recent Labs    06/29/17 0858 09/30/17 0810 01/16/18 0916  NA 142 144 143  K 4.3 4.5 4.4  CL 100 102 102  CO2 37* 37* 34*  GLUCOSE 141* 84 150*  BUN _0 CREATININE 1.55* 1.41* 1.54*  CALCIUM 9.7 9.9 9.6  TSH  --   --  1.60   Liver Function Tests: Recent Labs    03/29/17 0812 06/29/17 0858 09/30/17 0810 01/16/18 0916  AST 28 31  --  25  ALT 25 32 20 19  ALKPHOS 52  --   --   --   BILITOT 0.6 0.5  --  0.3  PROT 6.9 6.9  --  6.9  ALBUMIN 4.5  --   --   --    No results for input(s): LIPASE, AMYLASE in the last 8760 hours. No results for input(s): AMMONIA in the last 8760 hours. CBC: No results for input(s): WBC, NEUTROABS, HGB, HCT, MCV, PLT in the last 8760 hours. Lipid Panel: Recent Labs    06/29/17 0858 09/30/17 0810 01/16/18 0916  CHOL 151 147 155  HDL 39* 36* 30*  LDLCALC 89 87 90  TRIG 126 139 268*  CHOLHDL 3.9 4.1 5.2*   Lab Results  Component Value Date   HGBA1C 6.9 (H) 01/16/2018    Procedures since last visit: No results found.  Assessment/Plan   ICD-10-CM   1. Weight loss R63.4 CMP with eGFR([facility])  2. Moderate dementia with behavioral disturbance F03.91 CMP with eGFR([facility])  3. Centrilobular emphysema ([place]) J43.2   4. BPH without obstruction/lower urinary tract symptoms N40.0 Ambulatory referral to Urology  5. Bilateral lower extremity edema R60.0   6. Essential hypertension I10 CMP with eGFR([facility])  7. Mixed hyperlipidemia E78.2 Lipid Panel  8. History of stroke Z86.73   9. Type 2 diabetes mellitus with stage 3 chronic kidney disease, with long-term current use of insulin (HCC) E11.22 Hemoglobin A1c   N18.3    Z79.4   10. Pulmonary nodules R91.8   11. COPD with acute exacerbation ([place]) J44.1    Continue current medications as ordered  Follow up with specialists as scheduled  RECOMMEND [place] for goals of care due to weight loss  START  ZPAK AS DIRECTED FOR LUNG INFECTION  Take probiotic 2 times daily while on antibiotic to keep colon happy  Follow up in 3 mos for DM, HTN, hyperlipidemia, BPH. Fasting labs prior to appt   Severn S. Perlie Gold  [facility] [address] [place], [place] [address] [phone] Cell (Monday-Friday 8 AM - 5 PM) [phone] After 5 PM and follow prompts

## 2018-01-20 NOTE — Patient Instructions (Addendum)
Continue current medications as ordered  Follow up with specialists as scheduled  RECOMMEND [place] for goals of care due to weight loss  START ZPAK AS DIRECTED FOR LUNG INFECTION  Take probiotic 2 times daily while on antibiotic to keep colon happy  Follow up in 3 mos for DM, HTN, hyperlipidemia, BPH. Fasting labs prior to appt

## 2018-01-24 ENCOUNTER — Telehealth: Payer: Self-pay | Admitting: *Deleted

## 2018-01-24 NOTE — Telephone Encounter (Signed)
Spoke with patient's daughter. She was asking about [facility] referral for bathing pt, etc. I didn't see one in chart after March 2019, but I saw you mention palliative care referral in his note Can you advise? Thanks!

## 2018-01-24 NOTE — Telephone Encounter (Signed)
Daughter declined palliative care; we can try home health referral only if he is home bound; explained to daughter at last appt that he will need a private duty nurse to help care for him if he is not home bound

## 2018-01-25 NOTE — Telephone Encounter (Signed)
LMOM to return my call.

## 2018-01-26 NOTE — Telephone Encounter (Signed)
Spoke with patient's daughter. She said he is able to get out of the house if she takes him. She will contact some private duty agencies and get some prices for their help and let us know if he needs a referral.

## 2018-02-15 ENCOUNTER — Encounter: Payer: Self-pay | Admitting: Podiatry

## 2018-02-15 ENCOUNTER — Ambulatory Visit: Payer: Medicare HMO | Admitting: Podiatry

## 2018-02-15 DIAGNOSIS — M79675 Pain in left toe(s): Secondary | ICD-10-CM | POA: Diagnosis not present

## 2018-02-15 DIAGNOSIS — B351 Tinea unguium: Secondary | ICD-10-CM

## 2018-02-15 DIAGNOSIS — M79674 Pain in right toe(s): Secondary | ICD-10-CM

## 2018-02-15 DIAGNOSIS — E1159 Type 2 diabetes mellitus with other circulatory complications: Secondary | ICD-10-CM

## 2018-02-15 NOTE — Progress Notes (Signed)
Complaint:  Visit Type: Patient returns to my office for continued preventative foot care services. Complaint: Patient states" my nails have grown long and thick and become painful to walk and wear shoes" Patient has been diagnosed with DM with angiopathy. The patient presents for preventative foot care services. No changes to ROS  Podiatric Exam: Vascular: dorsalis pedis  are palpable bilateral. Posterior tibial pulses are not palpable  B/L Capillary return is immediate. Temperature gradient is WNL. Skin turgor WNL  Sensorium: Normal Semmes Weinstein monofilament test. Normal tactile sensation bilaterally. Nail Exam: Pt has thick disfigured discolored nails with subungual debris noted bilateral entire nail hallux through fifth toenails Ulcer Exam: There is no evidence of ulcer or pre-ulcerative changes or infection. Orthopedic Exam: Muscle tone and strength are WNL. No limitations in general ROM. No crepitus or effusions noted. Hammer toes second  B/L.   Bony prominences are unremarkable. Skin: No Porokeratosis. No infection or ulcers  Diagnosis:  Onychomycosis, , Pain in right toe, pain in left toes  Treatment & Plan Procedures and Treatment: Consent by patient was obtained for treatment procedures.   Debridement of mycotic and hypertrophic toenails, 1 through 5 bilateral and clearing of subungual debris. No ulceration, no infection noted.  Return Visit-Office Procedure: Patient instructed to return to the office for a follow up visit 3 months for continued evaluation and treatment.    Starlyn Droge DPM 

## 2018-02-20 DIAGNOSIS — R972 Elevated prostate specific antigen [PSA]: Secondary | ICD-10-CM | POA: Diagnosis not present

## 2018-03-01 ENCOUNTER — Telehealth: Payer: Self-pay

## 2018-03-01 NOTE — Telephone Encounter (Signed)
Called patient to try to schedule AWV in the office on 04/26/18 @ 8:30am. No answer-left voicemail to call back.

## 2018-03-07 ENCOUNTER — Ambulatory Visit (INDEPENDENT_AMBULATORY_CARE_PROVIDER_SITE_OTHER): Payer: Medicare HMO | Admitting: Internal Medicine

## 2018-03-07 ENCOUNTER — Encounter: Payer: Self-pay | Admitting: Internal Medicine

## 2018-03-07 VITALS — BP 134/74 | HR 75 | Temp 98.3°F | Ht 71.0 in | Wt 198.0 lb

## 2018-03-07 DIAGNOSIS — F0391 Unspecified dementia with behavioral disturbance: Secondary | ICD-10-CM

## 2018-03-07 DIAGNOSIS — J432 Centrilobular emphysema: Secondary | ICD-10-CM

## 2018-03-07 DIAGNOSIS — F03B18 Unspecified dementia, moderate, with other behavioral disturbance: Secondary | ICD-10-CM

## 2018-03-07 DIAGNOSIS — Z87891 Personal history of nicotine dependence: Secondary | ICD-10-CM

## 2018-03-07 MED ORDER — METHYLPREDNISOLONE 4 MG PO TBPK: ORAL_TABLET | ORAL | 0 refills | Status: DC

## 2018-03-07 NOTE — Patient Instructions (Signed)
START MEDROL DOSE PAK AS DIRECTED FOR WHEEZING  CALL OFFICE IF BLOOD SUGARS STAY > 200  USE INHALER 3 TIMES DAILY X 7 DAYS THEN 2 TIMES DAILY X 7 DAYS THEN DAILY X 7 DAYS THEN USE AS NEEDED FOR WHEEZING  Continue other medications as ordered  Follow up as scheduled next month or sooner if need be

## 2018-03-07 NOTE — Progress Notes (Signed)
Patient ID: [name], male   DOB: 03/08/1922, 84 y.o.   MRN: [id number]   [facility] OFFICE  Provider: DR Arletha Grippe  Code Status:  Goals of Care:  Advanced Directives 02/22/2017  Does Patient Have a Medical Advance Directive? Yes  Type of Paramedic of [place];Living will  Does patient want to make changes to medical advance directive? No - Patient declined  Copy of [place] in Chart? No - copy requested  Would patient like information on creating a medical advance directive? -     Chief Complaint  Patient presents with  . Acute Visit    Productive Cough  x 1 week, no known fever. Patient using inhaler    . Medication Refill    No refills needed   . Audit C Screening    Neg Audit C Screening     HPI: Patient is a 84 y.o. male seen today for an acute visit for 2 week hx productive cough and wheezing. No f/c. No nausea/vomiting. No CP or SOB. No sick contacts. He has a hx emphysema and uses HFA prn which helps cough and wheezing. He is a poor historian due to dementia. Hx obtained from chart and daughter. He has a past hx tobacco abuse. Weight improved since last ov. He has BPH with elevated PSA  Past Medical History:  Diagnosis Date  . Arthritis   . CAD (coronary artery disease)    s/p DES to LAD 06/2013  . Depression   . Dyslipidemia   . Glaucoma   . Hyperglycemia 04/17/2014  . Hyperlipidemia   . Hypertension   . Stroke ([place])   . Type 2 diabetes mellitus ([place])     Past Surgical History:  Procedure Laterality Date  . CARDIAC CATHETERIZATION  06/2013   DES to LAD by Dr Tamala Julian  . Right Cataract       reports that he quit smoking about 49 years ago. His smoking use included cigarettes. He has never used smokeless tobacco. He reports that he does not drink alcohol or use drugs. Social History   Socioeconomic History  . Marital status: Widowed    Spouse name: Not on file  . Number of children: 4  . Years of education: Not  on file  . Highest education level: Not on file  Occupational History  . Occupation: Retired  Scientific laboratory technician  . Financial resource strain: Not on file  . Food insecurity:    Worry: Not on file    Inability: Not on file  . Transportation needs:    Medical: Not on file    Non-medical: Not on file  Tobacco Use  . Smoking status: Former Smoker    Types: Cigarettes    Last attempt to quit: 08/02/1968    Years since quitting: 49.6  . Smokeless tobacco: Never Used  Substance and Sexual Activity  . Alcohol use: No    Alcohol/week: 0.0 oz  . Drug use: No  . Sexual activity: Not Currently  Lifestyle  . Physical activity:    Days per week: Not on file    Minutes per session: Not on file  . Stress: Not on file  Relationships  . Social connections:    Talks on phone: Not on file    Gets together: Not on file    Attends religious service: Not on file    Active member of club or organization: Not on file    Attends meetings of clubs or organizations: Not  on file    Relationship status: Not on file  . Intimate partner violence:    Fear of current or ex partner: Not on file    Emotionally abused: Not on file    Physically abused: Not on file    Forced sexual activity: Not on file  Other Topics Concern  . Not on file  Social History Narrative   Retired Warden/ranger   widowed, lives with dtr and youngest son   5 kids from first marriage    Family History  Problem Relation Age of Onset  . Arthritis Mother   . Breast cancer Mother   . Arthritis Father   . Dementia Father   . Diabetes Other   . Arthritis Other   . Colon cancer Other   . Stroke Other   . Diabetes type II Sister   . Suicidality Brother   . Diabetes type I Brother   . Diabetes type I Brother   . Diabetes type I Brother   . Diabetes type II Sister   . Breast cancer Daughter   . Emphysema Daughter     Allergies  Allergen Reactions  . Lisinopril Cough    Outpatient Encounter Medications as of 03/07/2018    Medication Sig  . albuterol (PROVENTIL HFA;VENTOLIN HFA) 108 (90 Base) MCG/ACT inhaler Two puffs every 4-6 hours as needed for difficulty breathing,wheezing or cough  . Alcohol Swabs (B-D SINGLE USE SWABS REGULAR) PADS USE AS DIRECTED WITH CHECKING BLOOD SUGAR THREE TIMES DAILY  . amLODipine (NORVASC) 5 MG tablet Take 1 tablet (5 mg total) by mouth daily.  [name] aspirin EC 81 MG tablet Take 1 tablet (81 mg total) by mouth daily.  . B-D UF III MINI PEN NEEDLES 31G X 5 MM MISC USE AS DIRECTED  . Blood Glucose Calibration (ACCU-CHEK AVIVA) SOLN Use as Directed. Dx: E11.51  . Blood Glucose Monitoring Suppl (ACCU-CHEK AVIVA PLUS) w/Device KIT Use as directed with checking blood sugar three times daily. Dx: E11.51  . escitalopram (LEXAPRO) 5 MG tablet Take one tablet by mouth once daily  . fluticasone (FLONASE) 50 MCG/ACT nasal spray Place 1 spray into both nostrils daily.  . furosemide (LASIX) 20 MG tablet Take 1 tablet (20 mg total) by mouth daily as needed for edema.  [name] glucose blood (ACCU-CHEK AVIVA) test strip Use to test blood sugar three times daily. Dx: E11.51  . hydrochlorothiazide (HYDRODIURIL) 25 MG tablet Take 1 tablet (25 mg total) by mouth daily.  Elmore Guise Devices (ACCU-CHEK SOFTCLIX) lancets Use as instructed with checking blood sugar three times daily. Dx: E11.51  . Loratadine 10 MG CAPS Take 1 capsule by mouth daily.  [name] losartan (COZAAR) 25 MG tablet Take 1 tablet (25 mg total) by mouth daily.  . Memantine HCl-Donepezil HCl (NAMZARIC) 28-10 MG CP24 Take 1 capsule by mouth daily.  . Multiple Vitamins-Minerals (SENTRY SENIOR) TABS Take 1 tablet by mouth daily.  . simvastatin (ZOCOR) 20 MG tablet Take 1 tablet (20 mg total) by mouth daily.  . TOUJEO MAX SOLOSTAR 300 UNIT/ML SOPN INJECT 50 UNITS INTO THE SKIN DAILY.  . [DISCONTINUED] ACCU-CHEK SOFTCLIX LANCETS lancets USE AS INSTRUCTED WITH CHECKING BLOOD SUGAR THREE TIMES DAILY  . [DISCONTINUED] azithromycin (ZITHROMAX) 250 MG tablet Take  2 tabs po today then 1 tab po daily x 4 days  . [DISCONTINUED] saccharomyces boulardii (FLORASTOR) 250 MG capsule Take 1 capsule (250 mg total) by mouth 2 (two) times daily.   No facility-administered encounter medications on file as  of 03/07/2018.     Review of Systems:  Review of Systems  Unable to perform ROS: Dementia    Health Maintenance  Topic Date Due  . INFLUENZA VACCINE  03/02/2018  . OPHTHALMOLOGY EXAM  06/14/2018  . HEMOGLOBIN A1C  07/18/2018  . FOOT EXAM  10/05/2018  . TETANUS/TDAP  07/29/2025  . PNA vac Low Risk Adult  Completed    Physical Exam: Vitals:   03/07/18 1423  BP: 134/74  Pulse: 75  Temp: 98.3 F (36.8 C)  TempSrc: Oral  SpO2: 95%  Weight: 198 lb (89.8 kg)  Height: 5' 11" (1.803 m)   Body mass index is 27.62 kg/m. Physical Exam  HENT:  Head: Normocephalic and atraumatic.  Right Ear: External ear normal.  Left Ear: External ear normal.  Nose: Nose normal.  Oropharynx cobblestoning and redness but no exudate; no sinus TTP; nares with enlarged grey dry turbinates  Eyes: Pupils are equal, round, and reactive to light. No scleral icterus.  Neck: Neck supple.  Cardiovascular: Normal rate and regular rhythm. Exam reveals no gallop and no friction rub.  Murmur (1/6 SEM) heard. +1 pitting BLE edema; no calf TTP; chronic venous stasis changes with hyperpigmentation of skin  Pulmonary/Chest: Effort normal. No stridor. No respiratory distress. He has wheezes (end expiratory with prolonged expiratory phase). He has no rales. He exhibits no tenderness.  Musculoskeletal: He exhibits edema (small joints).  Lymphadenopathy:    He has no cervical adenopathy.  Neurological: He is alert.  Skin: Skin is warm and dry.  Psychiatric: He has a normal mood and affect. His behavior is normal. His speech is slurred.    Labs reviewed: Basic Metabolic Panel: Recent Labs    06/29/17 0858 09/30/17 0810 01/16/18 0916  NA 142 144 143  K 4.3 4.5 4.4  CL 100 102  102  CO2 37* 37* 34*  GLUCOSE 141* 84 150*  BUN _0 CREATININE 1.55* 1.41* 1.54*  CALCIUM 9.7 9.9 9.6  TSH  --   --  1.60   Liver Function Tests: Recent Labs    03/29/17 0812 06/29/17 0858 09/30/17 0810 01/16/18 0916  AST 28 31  --  25  ALT 25 32 20 19  ALKPHOS 52  --   --   --   BILITOT 0.6 0.5  --  0.3  PROT 6.9 6.9  --  6.9  ALBUMIN 4.5  --   --   --    No results for input(s): LIPASE, AMYLASE in the last 8760 hours. No results for input(s): AMMONIA in the last 8760 hours. CBC: No results for input(s): WBC, NEUTROABS, HGB, HCT, MCV, PLT in the last 8760 hours. Lipid Panel: Recent Labs    06/29/17 0858 09/30/17 0810 01/16/18 0916  CHOL 151 147 155  HDL 39* 36* 30*  LDLCALC 89 87 90  TRIG 126 139 268*  CHOLHDL 3.9 4.1 5.2*   Lab Results  Component Value Date   HGBA1C 6.9 (H) 01/16/2018    Procedures since last visit: No results found.  Assessment/Plan   ICD-10-CM   1. Centrilobular emphysema (HCC) - mild exacerbation J43.2 methylPREDNISolone (MEDROL DOSEPAK) 4 MG TBPK tablet  2. History of tobacco abuse Z87.891   3. Moderate dementia with behavioral disturbance F03.91    START MEDROL DOSE PAK AS DIRECTED FOR WHEEZING  CALL OFFICE IF BLOOD SUGARS STAY > 200  USE INHALER 3 TIMES DAILY X 7 DAYS THEN 2 TIMES DAILY X 7 DAYS THEN DAILY X 7  DAYS THEN USE AS NEEDED FOR WHEEZING  Continue other medications as ordered  Follow up as scheduled next month or sooner if need be   Sabrinna Yearwood S. Perlie Gold  [facility] [address] [place], [place] [phone] Cell (Monday-Friday 8 AM - 5 PM) [phone] After 5 PM and follow prompts

## 2018-03-08 ENCOUNTER — Other Ambulatory Visit: Payer: Self-pay | Admitting: *Deleted

## 2018-03-08 ENCOUNTER — Other Ambulatory Visit: Payer: Self-pay | Admitting: Internal Medicine

## 2018-03-08 ENCOUNTER — Other Ambulatory Visit: Payer: Self-pay

## 2018-03-08 DIAGNOSIS — F32A Depression, unspecified: Secondary | ICD-10-CM

## 2018-03-08 DIAGNOSIS — E1122 Type 2 diabetes mellitus with diabetic chronic kidney disease: Secondary | ICD-10-CM

## 2018-03-08 DIAGNOSIS — Z794 Long term (current) use of insulin: Principal | ICD-10-CM

## 2018-03-08 DIAGNOSIS — F329 Major depressive disorder, single episode, unspecified: Secondary | ICD-10-CM

## 2018-03-08 DIAGNOSIS — N183 Chronic kidney disease, stage 3 unspecified: Secondary | ICD-10-CM

## 2018-03-08 MED ORDER — LOSARTAN POTASSIUM 50 MG PO TABS: 50.0000 mg | ORAL_TABLET | Freq: Every day | ORAL | 0 refills | Status: DC

## 2018-03-08 MED ORDER — LOSARTAN POTASSIUM 50 MG PO TABS: 50.0000 mg | ORAL_TABLET | Freq: Every day | ORAL | 1 refills | Status: DC

## 2018-03-08 NOTE — Telephone Encounter (Signed)
A fax was received from [name] stating the following: "The patient states that the prescription should have been different than you prescribed. Patient states that it should be losartan 25 mg, take 1 tablet twice daily."  Based on current medication list, patient is to take 25 mg once daily. I called patient's daughter, [name], to clarify dosage. She stated that Dr. Eulas Post had increased dosage to 50 mg once daily on 10/04/17 but she forgot to mention the change during OV yesterday.   [name] has requested that a temp supply of 50 mg losartan be sent to local pharmacy and a 90 script be sent to [facility].   Rx has been sent to the pharmacy.

## 2018-03-14 ENCOUNTER — Telehealth: Payer: Self-pay | Admitting: Internal Medicine

## 2018-03-14 NOTE — Telephone Encounter (Signed)
Left msg w/ pt's dtr asking her to confirm this AWV-S w/ nurse before seeing Dr. Eulas Post. VDM (DD)

## 2018-03-22 ENCOUNTER — Encounter: Payer: Self-pay | Admitting: Internal Medicine

## 2018-04-04 ENCOUNTER — Other Ambulatory Visit: Payer: Medicare HMO

## 2018-04-05 ENCOUNTER — Ambulatory Visit: Payer: Medicare HMO | Admitting: Internal Medicine

## 2018-04-14 ENCOUNTER — Other Ambulatory Visit: Payer: Self-pay

## 2018-04-14 ENCOUNTER — Telehealth: Payer: Self-pay

## 2018-04-14 DIAGNOSIS — R918 Other nonspecific abnormal finding of lung field: Secondary | ICD-10-CM

## 2018-04-14 NOTE — Telephone Encounter (Signed)
A call was received from [facility] stating that patient is scheduled for a chest CT next week but the order needs to be changed. Patient is currently scheduled for CT chest nodule follow up low dose without contrast but patient does not qualify for this test due to his age.   The correct test would be CT chest without contrast. New order was placed.

## 2018-04-19 ENCOUNTER — Other Ambulatory Visit: Payer: Medicare HMO

## 2018-04-19 ENCOUNTER — Ambulatory Visit
Admission: RE | Admit: 2018-04-19 | Discharge: 2018-04-19 | Disposition: A | Discharge status: Home / Self Care | Payer: Medicare HMO | Source: Ambulatory Visit | Attending: Internal Medicine | Admitting: Internal Medicine

## 2018-04-19 ENCOUNTER — Ambulatory Visit: Payer: Medicare HMO

## 2018-04-19 DIAGNOSIS — R918 Other nonspecific abnormal finding of lung field: Secondary | ICD-10-CM

## 2018-04-25 ENCOUNTER — Other Ambulatory Visit: Payer: Self-pay

## 2018-04-25 ENCOUNTER — Other Ambulatory Visit: Payer: Self-pay | Admitting: Internal Medicine

## 2018-04-25 DIAGNOSIS — Z79899 Other long term (current) drug therapy: Secondary | ICD-10-CM

## 2018-04-25 DIAGNOSIS — R634 Abnormal weight loss: Secondary | ICD-10-CM

## 2018-04-26 ENCOUNTER — Ambulatory Visit (INDEPENDENT_AMBULATORY_CARE_PROVIDER_SITE_OTHER): Payer: Medicare HMO

## 2018-04-26 ENCOUNTER — Ambulatory Visit (INDEPENDENT_AMBULATORY_CARE_PROVIDER_SITE_OTHER): Payer: Medicare HMO | Admitting: Internal Medicine

## 2018-04-26 ENCOUNTER — Encounter: Payer: Self-pay | Admitting: Internal Medicine

## 2018-04-26 VITALS — BP 164/68 | HR 84 | Temp 98.0°F | Ht 71.0 in | Wt 198.0 lb

## 2018-04-26 DIAGNOSIS — F03C0 Unspecified dementia, severe, without behavioral disturbance, psychotic disturbance, mood disturbance, and anxiety: Secondary | ICD-10-CM

## 2018-04-26 DIAGNOSIS — Z79899 Other long term (current) drug therapy: Secondary | ICD-10-CM | POA: Diagnosis not present

## 2018-04-26 DIAGNOSIS — E782 Mixed hyperlipidemia: Secondary | ICD-10-CM

## 2018-04-26 DIAGNOSIS — I10 Essential (primary) hypertension: Secondary | ICD-10-CM | POA: Diagnosis not present

## 2018-04-26 DIAGNOSIS — R634 Abnormal weight loss: Secondary | ICD-10-CM | POA: Diagnosis not present

## 2018-04-26 DIAGNOSIS — R918 Other nonspecific abnormal finding of lung field: Secondary | ICD-10-CM

## 2018-04-26 DIAGNOSIS — F0391 Unspecified dementia with behavioral disturbance: Secondary | ICD-10-CM | POA: Diagnosis not present

## 2018-04-26 DIAGNOSIS — Z23 Encounter for immunization: Secondary | ICD-10-CM | POA: Diagnosis not present

## 2018-04-26 DIAGNOSIS — Z0000 Encounter for general adult medical examination without abnormal findings: Secondary | ICD-10-CM

## 2018-04-26 DIAGNOSIS — R600 Localized edema: Secondary | ICD-10-CM | POA: Diagnosis not present

## 2018-04-26 DIAGNOSIS — F0390 Unspecified dementia without behavioral disturbance: Secondary | ICD-10-CM

## 2018-04-26 DIAGNOSIS — N183 Chronic kidney disease, stage 3 unspecified: Secondary | ICD-10-CM

## 2018-04-26 DIAGNOSIS — F03B18 Unspecified dementia, moderate, with other behavioral disturbance: Secondary | ICD-10-CM

## 2018-04-26 DIAGNOSIS — Z794 Long term (current) use of insulin: Secondary | ICD-10-CM

## 2018-04-26 DIAGNOSIS — N400 Enlarged prostate without lower urinary tract symptoms: Secondary | ICD-10-CM

## 2018-04-26 DIAGNOSIS — E1122 Type 2 diabetes mellitus with diabetic chronic kidney disease: Secondary | ICD-10-CM | POA: Diagnosis not present

## 2018-04-26 DIAGNOSIS — Z8673 Personal history of transient ischemic attack (TIA), and cerebral infarction without residual deficits: Secondary | ICD-10-CM | POA: Diagnosis not present

## 2018-04-26 DIAGNOSIS — J432 Centrilobular emphysema: Secondary | ICD-10-CM

## 2018-04-26 DIAGNOSIS — Z723 Lack of physical exercise: Secondary | ICD-10-CM

## 2018-04-26 NOTE — Patient Instructions (Addendum)
RECOMMEND PLACEMENT IN SKILLED NURSING FACILITY - [facility]OOK INTO [facility]  Continue current medications as ordered  Will call with lab results  Follow up in 3 mos with Janett Billow for dementia, DM, HTN.

## 2018-04-26 NOTE — Progress Notes (Signed)
Subjective:   [name] is a 84 y.o. male who presents for Medicare Annual/Subsequent preventive examination.  Last AWV-12/10/2016       Objective:    Vitals: BP (!) 164/68 (BP Location: Left Arm, Patient Position: Sitting)   Pulse 84   Temp 98 F (36.7 C) (Oral)   Ht '5\' 11"'  (1.803 m)   Wt 198 lb (89.8 kg)   BMI 27.62 kg/m   Body mass index is 27.62 kg/m.  Provider notified of BP  Advanced Directives 04/26/2018 04/26/2018 02/22/2017 01/20/2017 12/10/2016 09/02/2016 07/23/2016  Does Patient Have a Medical Advance Directive? Yes Yes Yes Yes Yes Yes Yes  Type of Industrial/product designer of [employer] Power of [place];Living will [place];Living will [place];Living will Healthcare Power of [place]  Does patient want to make changes to medical advance directive? No - Patient declined No - Patient declined No - Patient declined - No - Patient declined - -  Copy of [place] in Chart? Yes No - copy requested No - copy requested No - copy requested No - copy requested No - copy requested No - copy requested  Would patient like information on creating a medical advance directive? - - - - - - -    Tobacco Social History   Tobacco Use  Smoking Status Former Smoker  . Types: Cigarettes  . Last attempt to quit: 08/02/1968  . Years since quitting: 49.7  Smokeless Tobacco Never Used     Counseling given: Not Answered   Clinical Intake:  Pre-visit preparation completed: No  Pain : No/denies pain     Diabetes: Yes CBG done?: No Did pt. bring in CBG monitor from home?: No  How often do you need to have someone help you when you read instructions, pamphlets, or other written materials from your doctor or pharmacy?: 3 - Sometimes What is the last grade level you completed in school?: 3rd grade  Interpreter Needed?: No  Information entered by ::  Tyson Dense, RN  Past Medical History:  Diagnosis Date  . Arthritis   . CAD (coronary artery disease)    s/p DES to LAD 06/2013  . Depression   . Dyslipidemia   . Glaucoma   . Hyperglycemia 04/17/2014  . Hyperlipidemia   . Hypertension   . Stroke ([place])   . Type 2 diabetes mellitus ([place])    Past Surgical History:  Procedure Laterality Date  . CARDIAC CATHETERIZATION  06/2013   DES to LAD by Dr Tamala Julian  . Right Cataract     Family History  Problem Relation Age of Onset  . Arthritis Mother   . Breast cancer Mother   . Arthritis Father   . Dementia Father   . Diabetes Other   . Arthritis Other   . Colon cancer Other   . Stroke Other   . Diabetes type II Sister   . Suicidality Brother   . Diabetes type I Brother   . Diabetes type I Brother   . Diabetes type I Brother   . Diabetes type II Sister   . Breast cancer Daughter   . Emphysema Daughter    Social History   Socioeconomic History  . Marital status: Widowed    Spouse name: Not on file  . Number of children: 4  . Years of education: Not on file  . Highest education level: Not on file  Occupational History  .  Occupation: Retired  Scientific laboratory technician  . Financial resource strain: Not hard at all  . Food insecurity:    Worry: Never true    Inability: Never true  . Transportation needs:    Medical: No    Non-medical: No  Tobacco Use  . Smoking status: Former Smoker    Types: Cigarettes    Last attempt to quit: 08/02/1968    Years since quitting: 49.7  . Smokeless tobacco: Never Used  Substance and Sexual Activity  . Alcohol use: No    Alcohol/week: 0.0 standard drinks  . Drug use: No  . Sexual activity: Not Currently  Lifestyle  . Physical activity:    Days per week: 0 days    Minutes per session: 0 min  . Stress: Not at all  Relationships  . Social connections:    Talks on phone: More than three times a week    Gets together: More than three times a week    Attends religious service: Never    Active  member of club or organization: No    Attends meetings of clubs or organizations: Never    Relationship status: Widowed  Other Topics Concern  . 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 albuterol (PROVENTIL HFA;VENTOLIN HFA) 108 (90 Base) MCG/ACT inhaler INHALE TWO PUFFS EVERY 4-6 HOURS AS NEEDED FOR DIFFICULTY BREATHING,WHEEZING OR COUGH  . Alcohol Swabs (B-D SINGLE USE SWABS REGULAR) PADS USE AS DIRECTED WITH CHECKING BLOOD SUGAR THREE TIMES DAILY  . amLODipine (NORVASC) 5 MG tablet Take 1 tablet (5 mg total) by mouth daily.  [name] aspirin EC 81 MG tablet Take 1 tablet (81 mg total) by mouth daily.  . Blood Glucose Calibration (ACCU-CHEK AVIVA) SOLN Use as Directed. Dx: E11.51  . Blood Glucose Monitoring Suppl (ACCU-CHEK AVIVA PLUS) w/Device KIT Use as directed with checking blood sugar three times daily. Dx: E11.51  . escitalopram (LEXAPRO) 5 MG tablet TAKE 1 TABLET EVERY DAY  . fluticasone (FLONASE) 50 MCG/ACT nasal spray Place 1 spray into both nostrils daily.  . furosemide (LASIX) 20 MG tablet Take 1 tablet (20 mg total) by mouth daily as needed for edema.  [name] glucose blood (ACCU-CHEK AVIVA) test strip Use to test blood sugar three times daily. Dx: E11.51  . hydrochlorothiazide (HYDRODIURIL) 25 MG tablet TAKE 1 TABLET (25 MG TOTAL) BY MOUTH DAILY.  [name] Insulin Pen Needle (B-D UF III MINI PEN NEEDLES) 31G X 5 MM MISC USE AS DIRECTED.  Elmore Guise Devices (ACCU-CHEK SOFTCLIX) lancets Use as instructed with checking blood sugar three times daily. Dx: E11.51  . Loratadine 10 MG CAPS Take 1 capsule by mouth daily.  [name] losartan (COZAAR) 50 MG tablet Take 1 tablet (50 mg total) by mouth daily.  . Memantine HCl-Donepezil HCl (NAMZARIC) 28-10 MG CP24 Take 1 capsule by mouth daily.  . methylPREDNISolone (MEDROL DOSEPAK) 4 MG TBPK tablet  Take as directed for emphysema flare  . Multiple Vitamins-Minerals (SENTRY SENIOR) TABS Take 1 tablet by mouth daily.  . simvastatin (ZOCOR) 20 MG tablet Take 1 tablet (20 mg total) by mouth daily.  . TOUJEO MAX SOLOSTAR 300 UNIT/ML SOPN INJECT 50 UNITS INTO THE SKIN DAILY.   No facility-administered encounter medications on file as of 04/26/2018.     Activities of Daily Living In your present  state of health, do you have any difficulty performing the following activities: 04/26/2018  Hearing? Y  Vision? N  Difficulty concentrating or making decisions? Y  Walking or climbing stairs? Y  Dressing or bathing? N  Doing errands, shopping? Y  Preparing Food and eating ? N  Using the Toilet? N  In the past six months, have you accidently leaked urine? N  Do you have problems with loss of bowel control? N  Managing your Medications? Y  Managing your Finances? Y  Housekeeping or managing your Housekeeping? Y  Some recent data might be hidden    Patient Care Team: Gildardo Cranker, DO as PCP - General (Internal Medicine) Macarthur Critchley, [place] as Referring Physician (Optometry) Delice Lesch Lezlie Octave, MD as Consulting Physician (Neurology)   Assessment:   This is a routine wellness examination for [place].  Exercise Activities and Dietary recommendations Current Exercise Habits: The patient does not participate in regular exercise at present, Exercise limited by: orthopedic condition(s);neurologic condition(s)  Goals    . Exercise 3x per week (30 min per time)     Starting 12/10/2016 I will become a part of a group that exercise.         Fall Risk Fall Risk  04/26/2018 03/07/2018 01/20/2018 10/04/2017 07/05/2017  Falls in the past year? No No No No No  Number falls in past yr: - - - - -  Injury with Fall? - - - - -  Comment - - - - -  Risk Factor Category  - - - - -   Is the patient's home free of loose throw rugs in walkways, pet beds, electrical cords, etc?   yes      Grab bars in the bathroom?  yes      Handrails on the stairs?   yes      Adequate lighting?   yes  Depression Screen PHQ 2/9 Scores 04/26/2018 07/05/2017 12/29/2016 12/10/2016  PHQ - 2 Score 0 0 0 0    Cognitive Function MMSE - Mini Mental State Exam 04/26/2018 04/18/2017 12/10/2016 09/22/2016 09/19/2015  Orientation to time 0 2 0 0 1  Orientation to Place '1 4 2 4 4  ' Registration '3 3 3 3 3  ' Attention/ Calculation 0 0 0 0 0  Recall 0 2 0 0 0  Language- name 2 objects '2 2 2 2 2  ' Language- repeat 1 0 '1 1 1  ' Language- follow 3 step command '3 3 2 2 2  ' Language- read & follow direction 0 0 '1 1 1  ' Write a sentence 0 0 0 0 1  Copy design 0 0 0 0 0  Total score '10 16 11 13 15        ' Immunization History  Administered Date(s) Administered  . Influenza, High Dose Seasonal PF 06/22/2013, 05/30/2015  . Influenza,inj,Quad PF,6+ Mos 04/19/2014, 04/02/2016, 04/01/2017  . Pneumococcal Conjugate-13 07/25/2014  . Pneumococcal Polysaccharide-23 08/03/2011, 06/22/2013  . Td 10/22/2013  . Tdap 07/30/2015  . Zoster Recombinat (Shingrix) 05/20/2017, 07/20/2017    Qualifies for Shingles Vaccine? Up to date, completed  Screening Tests Health Maintenance  Topic Date Due  . INFLUENZA VACCINE  03/02/2018  . OPHTHALMOLOGY EXAM  06/14/2018  . HEMOGLOBIN A1C  07/18/2018  . FOOT EXAM  10/05/2018  . TETANUS/TDAP  07/29/2025  . PNA vac Low Risk Adult  Completed   Cancer Screenings: Lung: Low Dose CT Chest recommended if Age 8-80 years, 30 pack-year currently smoking OR have quit w/in 15years. Patient does not  qualify. Colorectal: up to date  Additional Screenings:  Hepatitis C Screening: declined Flu vaccine due: given      Plan:  I have personally reviewed and addressed the Medicare Annual Wellness questionnaire and have noted the following in the patient's chart:  A. Medical and social history B. Use of alcohol, tobacco or illicit drugs  C. Current medications and supplements D. Functional ability and status E.   Nutritional status F.  Physical activity G. Advance directives H. List of other physicians I.  Hospitalizations, surgeries, and ER visits in previous 12 months J.  [place] to include hearing, vision, cognitive, depression L. Referrals and appointments - C3  In addition, I have reviewed and discussed with patient certain preventive protocols, quality metrics, and best practice recommendations. A written personalized care plan for preventive services as well as general preventive health recommendations were provided to patient.  See attached scanned questionnaire for additional information.   Signed,   Tyson Dense, RN Nurse Health Advisor  Patient Concerns: None

## 2018-04-26 NOTE — Patient Instructions (Addendum)
[name] , Thank you for taking time to come for your Medicare Wellness Visit. I appreciate your ongoing commitment to your health goals. Please review the following plan we discussed and let me know if I can assist you in the future.   Screening recommendations/referrals: Colonoscopy excluded, over age 82 Recommended yearly ophthalmology/optometry visit for glaucoma screening and checkup Recommended yearly dental visit for hygiene and checkup  Vaccinations: Influenza vaccine given today Pneumococcal vaccine up to date, completed Tdap vaccine up to date, due 07/29/2025 Shingles vaccine up to date, completed    Advanced directives: Please bring [place] a copy of your health care power of atrorney  Conditions/risks identified: none  Next appointment: Tyson Dense, RN 04/30/2019 @ 8:30am  Preventive Care 65 Years and Older, Male Preventive care refers to lifestyle choices and visits with your health care provider that can promote health and wellness. What does preventive care include?  A yearly physical exam. This is also called an annual well check.  Dental exams once or twice a year.  Routine eye exams. Ask your health care provider how often you should have your eyes checked.  Personal lifestyle choices, including:  Daily care of your teeth and gums.  Regular physical activity.  Eating a healthy diet.  Avoiding tobacco and drug use.  Limiting alcohol use.  Practicing safe sex.  Taking low doses of aspirin every day.  Taking vitamin and mineral supplements as recommended by your health care provider. What happens during an annual well check? The services and screenings done by your health care provider during your annual well check will depend on your age, overall health, lifestyle risk factors, and family history of disease. Counseling  Your health care provider may ask you questions about your:  Alcohol use.  Tobacco use.  Drug use.  Emotional  well-being.  Home and relationship well-being.  Sexual activity.  Eating habits.  History of falls.  Memory and ability to understand (cognition).  Work and work Statistician. Screening  You may have the following tests or measurements:  Height, weight, and BMI.  Blood pressure.  Lipid and cholesterol levels. These may be checked every 5 years, or more frequently if you are over 48 years old.  Skin check.  Lung cancer screening. You may have this screening every year starting at age 71 if you have a 30-pack-year history of smoking and currently smoke or have quit within the past 15 years.  Fecal occult blood test (FOBT) of the stool. You may have this test every year starting at age 93.  Flexible sigmoidoscopy or colonoscopy. You may have a sigmoidoscopy every 5 years or a colonoscopy every 10 years starting at age 89.  Prostate cancer screening. Recommendations will vary depending on your family history and other risks.  Hepatitis C blood test.  Hepatitis B blood test.  Sexually transmitted disease (STD) testing.  Diabetes screening. This is done by checking your blood sugar (glucose) after you have not eaten for a while (fasting). You may have this done every 1-3 years.  Abdominal aortic aneurysm (AAA) screening. You may need this if you are a current or former smoker.  Osteoporosis. You may be screened starting at age 36 if you are at high risk. Talk with your health care provider about your test results, treatment options, and if necessary, the need for more tests. Vaccines  Your health care provider may recommend certain vaccines, such as:  Influenza vaccine. This is recommended every year.  Tetanus, diphtheria, and acellular pertussis (  Tdap, Td) vaccine. You may need a Td booster every 10 years.  Zoster vaccine. You may need this after age 68.  Pneumococcal 13-valent conjugate (PCV13) vaccine. One dose is recommended after age 13.  Pneumococcal  polysaccharide (PPSV23) vaccine. One dose is recommended after age 44. Talk to your health care provider about which screenings and vaccines you need and how often you need them. This information is not intended to replace advice given to you by your health care provider. Make sure you discuss any questions you have with your health care provider. Document Released: 08/15/2015 Document Revised: 04/07/2016 Document Reviewed: 05/20/2015 Elsevier Interactive Patient Education  2017 [employer] Prevention in the Home Falls can cause injuries. They can happen to people of all ages. There are many things you can do to make your home safe and to help prevent falls. What can I do on the outside of my home?  Regularly fix the edges of walkways and driveways and fix any cracks.  Remove anything that might make you trip as you walk through a door, such as a raised step or threshold.  Trim any bushes or trees on the path to your home.  Use bright outdoor lighting.  Clear any walking paths of anything that might make someone trip, such as rocks or tools.  Regularly check to see if handrails are loose or broken. Make sure that both sides of any steps have handrails.  Any raised decks and porches should have guardrails on the edges.  Have any leaves, snow, or ice cleared regularly.  Use sand or salt on walking paths during winter.  Clean up any spills in your garage right away. This includes oil or grease spills. What can I do in the bathroom?  Use night lights.  Install grab bars by the toilet and in the tub and shower. Do not use towel bars as grab bars.  Use non-skid mats or decals in the tub or shower.  If you need to sit down in the shower, use a plastic, non-slip stool.  Keep the floor dry. Clean up any water that spills on the floor as soon as it happens.  Remove soap buildup in the tub or shower regularly.  Attach bath mats securely with double-sided non-slip rug  tape.  Do not have throw rugs and other things on the floor that can make you trip. What can I do in the bedroom?  Use night lights.  Make sure that you have a light by your bed that is easy to reach.  Do not use any sheets or blankets that are too big for your bed. They should not hang down onto the floor.  Have a firm chair that has side arms. You can use this for support while you get dressed.  Do not have throw rugs and other things on the floor that can make you trip. What can I do in the kitchen?  Clean up any spills right away.  Avoid walking on wet floors.  Keep items that you use a lot in easy-to-reach places.  If you need to reach something above you, use a strong step stool that has a grab bar.  Keep electrical cords out of the way.  Do not use floor polish or wax that makes floors slippery. If you must use wax, use non-skid floor wax.  Do not have throw rugs and other things on the floor that can make you trip. What can I do with my stairs?  Do  not leave any items on the stairs.  Make sure that there are handrails on both sides of the stairs and use them. Fix handrails that are broken or loose. Make sure that handrails are as long as the stairways.  Check any carpeting to make sure that it is firmly attached to the stairs. Fix any carpet that is loose or worn.  Avoid having throw rugs at the top or bottom of the stairs. If you do have throw rugs, attach them to the floor with carpet tape.  Make sure that you have a light switch at the top of the stairs and the bottom of the stairs. If you do not have them, ask someone to add them for you. What else can I do to help prevent falls?  Wear shoes that:  Do not have high heels.  Have rubber bottoms.  Are comfortable and fit you well.  Are closed at the toe. Do not wear sandals.  If you use a stepladder:  Make sure that it is fully opened. Do not climb a closed stepladder.  Make sure that both sides of the  stepladder are locked into place.  Ask someone to hold it for you, if possible.  Clearly mark and make sure that you can see:  Any grab bars or handrails.  First and last steps.  Where the edge of each step is.  Use tools that help you move around (mobility aids) if they are needed. These include:  Canes.  Walkers.  Scooters.  Crutches.  Turn on the lights when you go into a dark area. Replace any light bulbs as soon as they burn out.  Set up your furniture so you have a clear path. Avoid moving your furniture around.  If any of your floors are uneven, fix them.  If there are any pets around you, be aware of where they are.  Review your medicines with your doctor. Some medicines can make you feel dizzy. This can increase your chance of falling. Ask your doctor what other things that you can do to help prevent falls. This information is not intended to replace advice given to you by your health care provider. Make sure you discuss any questions you have with your health care provider. Document Released: 05/15/2009 Document Revised: 12/25/2015 Document Reviewed: 08/23/2014 Elsevier Interactive Patient Education  2017 [employer].

## 2018-04-26 NOTE — Progress Notes (Signed)
Patient ID: Zade Falkner, male   DOB: 27-Mar-1922, 84 y.o.   MRN: [id number]   Location:  [facility] OFF[facility]  Provider: DR Arletha Grippe  Code Status:  Goals of Care:  Advanced Directives 04/26/2018  Does Patient Have a Medical Advance Directive? Yes  Type of Advance Directive [place]  Does patient want to make changes to medical advance directive? No - Patient declined  Copy of [place] in Chart? Yes  Would patient like information on creating a medical advance directive? -     Chief Complaint  Patient presents with  . Medical Management of Chronic Issues    3 Month Follow-up, AWV completed today, - depression, - fall, MMSE 10/30, Failed clock drawing    HPI: Patient is a 84 y.o. male seen today for medical management of chronic diseases.  He reports feeling well. No concerns. Completed AWV thus AM. MMSE 10/30 (prev 16/30). Weight up 9 lbs. Flu shot given today. He is a poor historian due to dementia. Hx obtained from chart.  Pulmonary nodules - Cough has resolved and daughter reports no chest congestion. Denies hemoptysis. CT chest in Jan 2019 revealed multiple right sided pulmonary nodules and PET scan recommended. He had a PET scan in Feb 2019 that revealed right minor fissure ovoid nodular density 17 mm suspicious for intrapulmonary lymph node; several other subcm nodules in RUL and RLL; no FDG uptake; small right pleural effusion; mildly enlarged prostate with FDG uptake at right lateral peripheral zone; b/l renal and left hepatic lobe cysts; aortic atherosclerosis. CT chest in Sept 2019 revealed stable pulmonary nodules and stable chronic right pleural effusion.   Elevated PSA/Prostate mass - noted on PET scan in Feb 2019 (mildly enlarged prostate with FDG uptake at right lateral peripheral zone). He was seen by urology Dr Felicie Morn and PSA 7.04. He was seen by urology Dr Jeffie Pollock in July 2019 and opted for surveillance given his age and  comorbidities.  DM - A1c 6.9% (prev 8.3%). He is taking Toujeo 50 units daily. Off metformin due to worsening renal fxn. BS at home <200 now. No low BS reactions. No numbness/tingling in hands/feet. Urine microalbumin/cr ratio 232 (prev 33). He gets a cough with lisinopril. Currently taking losartan 55m daily. LDL 90  HTN/CAD - s/p DES in 2014. BP stable on losartan, amlodipine and HCTZ. Takes ASA daily. He was seen by cardio Dr STamala Julianin July 2018 and told to f/u prn. LDL 94. 2D echo in Feb 2017 showed EF 65-70% and no wall motion abnormalities but does have grade 1 DD;mild-mod AI; mild MR. He has chronic LE swelling and has gained 10 lbs since Aug 2018.  Dementia with behavioral disturbance - takes namzaric for cognition and no significant changes. He takes lexapro for depression. Albumin 4.5. Weight down 10 lbs in last 3 mos. MMSE worse at 10/30 (prev 16/30). Followed by neurology Dr ADelice Lesch Weight loss is to be expected with end stage dementia. He has poor hygiene and requires frequent cuing with ADLs  Hyperlipidemia - stable on simvastatin. No myalgias. LDL 90; HDL 30; TG 268.   Depression - stable on lexapro. No behavioral changes  Gl[facility] - stable. [facility] Currently not on meds. No change in vision  Arthritis - stable pain. No worsening swelling  CKD - stage 3. Cr 1.54 (prev 1.41)  Hx CVA - speech is often garbled and he has unsteady gait. He has been falling more. He  takes ASA daily  Seasonal allergy - overall stable on claritin but does have breakthrough rhinorrhea   Past Medical History:  Diagnosis Date  . Arthritis   . CAD (coronary artery disease)    s/p DES to LAD 06/2013  . Depression   . Dyslipidemia   . Glaucoma   . Hyperglycemia 04/17/2014  . Hyperlipidemia   . Hypertension   . Stroke ([place])   . Type 2 diabetes mellitus ([place])     Past Surgical History:  Procedure Laterality Date  . CARDIAC CATHETERIZATION  06/2013    DES to LAD by Dr Tamala Julian  . Right Cataract       reports that he quit smoking about 49 years ago. His smoking use included cigarettes. He has never used smokeless tobacco. He reports that he does not drink alcohol or use drugs. Social History   Socioeconomic History  . Marital status: Widowed    Spouse name: Not on file  . Number of children: 4  . Years of education: Not on file  . Highest education level: Not on file  Occupational History  . Occupation: Retired  Scientific laboratory technician  . Financial resource strain: Not hard at all  . Food insecurity:    Worry: Never true    Inability: Never true  . Transportation needs:    Medical: No    Non-medical: No  Tobacco Use  . Smoking status: Former Smoker    Types: Cigarettes    Last attempt to quit: 08/02/1968    Years since quitting: 49.7  . Smokeless tobacco: Never Used  Substance and Sexual Activity  . Alcohol use: No    Alcohol/week: 0.0 standard drinks  . Drug use: No  . Sexual activity: Not Currently  Lifestyle  . Physical activity:    Days per week: 0 days    Minutes per session: 0 min  . Stress: Not at all  Relationships  . Social connections:    Talks on phone: More than three times a week    Gets together: More than three times a week    Attends religious service: Never    Active member of club or organization: No    Attends meetings of clubs or organizations: Never    Relationship status: Widowed  . Intimate partner violence:    Fear of current or ex partner: No    Emotionally abused: No    Physically abused: No    Forced sexual activity: No  Other Topics Concern  . Not on file  Social History Narrative   Retired Warden/ranger   widowed, lives with dtr and youngest son   5 kids from first marriage    Family History  Problem Relation Age of Onset  . Arthritis Mother   . Breast cancer Mother   . Arthritis Father   . Dementia Father   . Diabetes Other   . Arthritis Other   . Colon cancer Other   . Stroke  Other   . Diabetes type II Sister   . Suicidality Brother   . Diabetes type I Brother   . Diabetes type I Brother   . Diabetes type I Brother   . Diabetes type II Sister   . Breast cancer Daughter   . Emphysema Daughter     Allergies  Allergen Reactions  . Lisinopril Cough    Outpatient Encounter Medications as of 04/26/2018  Medication Sig  . albuterol (PROVENTIL HFA;VENTOLIN HFA) 108 (90 Base) MCG/ACT inhaler INHALE TWO PUFFS EVERY 4-6  HOURS AS NEEDED FOR DIFFICULTY BREATHING,WHEEZING OR COUGH  . Alcohol Swabs (B-D SINGLE USE SWABS REGULAR) PADS USE AS DIRECTED WITH CHECKING BLOOD SUGAR THREE TIMES DAILY  . amLODipine (NORVASC) 5 MG tablet Take 1 tablet (5 mg total) by mouth daily.  [name] aspirin EC 81 MG tablet Take 1 tablet (81 mg total) by mouth daily.  . Blood Glucose Calibration (ACCU-CHEK AVIVA) SOLN Use as Directed. Dx: E11.51  . Blood Glucose Monitoring Suppl (ACCU-CHEK AVIVA PLUS) w/Device KIT Use as directed with checking blood sugar three times daily. Dx: E11.51  . escitalopram (LEXAPRO) 5 MG tablet TAKE 1 TABLET EVERY DAY  . fluticasone (FLONASE) 50 MCG/ACT nasal spray Place 1 spray into both nostrils daily.  . furosemide (LASIX) 20 MG tablet Take 1 tablet (20 mg total) by mouth daily as needed for edema.  [name] glucose blood (ACCU-CHEK AVIVA) test strip Use to test blood sugar three times daily. Dx: E11.51  . hydrochlorothiazide (HYDRODIURIL) 25 MG tablet TAKE 1 TABLET (25 MG TOTAL) BY MOUTH DAILY.  [name] Insulin Pen Needle (B-D UF III MINI PEN NEEDLES) 31G X 5 MM MISC USE AS DIRECTED.  Elmore Guise Devices (ACCU-CHEK SOFTCLIX) lancets Use as instructed with checking blood sugar three times daily. Dx: E11.51  . Loratadine 10 MG CAPS Take 1 capsule by mouth daily.  [name] losartan (COZAAR) 50 MG tablet Take 1 tablet (50 mg total) by mouth daily.  . Memantine HCl-Donepezil HCl (NAMZARIC) 28-10 MG CP24 Take 1 capsule by mouth daily.  . methylPREDNISolone (MEDROL DOSEPAK) 4 MG TBPK tablet Take  as directed for emphysema flare  . Multiple Vitamins-Minerals (SENTRY SENIOR) TABS Take 1 tablet by mouth daily.  . simvastatin (ZOCOR) 20 MG tablet Take 1 tablet (20 mg total) by mouth daily.  . TOUJEO MAX SOLOSTAR 300 UNIT/ML SOPN INJECT 50 UNITS INTO THE SKIN DAILY.   No facility-administered encounter medications on file as of 04/26/2018.     Review of Systems:  Review of Systems  Unable to perform ROS: Dementia    Health Maintenance  Topic Date Due  . OPHTHALMOLOGY EXAM  06/14/2018  . HEMOGLOBIN A1C  07/18/2018  . FOOT EXAM  10/05/2018  . TETANUS/TDAP  07/29/2025  . INFLUENZA VACCINE  Completed  . PNA vac Low Risk Adult  Completed    Physical Exam: Vitals:   04/26/18 0821  BP: (!) 164/68  Pulse: 84  Temp: 98 F (36.7 C)  TempSrc: Oral  SpO2: 94%  Weight: 198 lb (89.8 kg)  Height: '5\' 11"'  (1.803 m)   Body mass index is 27.62 kg/m. Physical Exam  Constitutional: He is oriented to person, place, and time. He appears well-developed and well-nourished.  HENT:  Mouth/Throat: Oropharynx is clear and moist.  MMM; no oral thrush  Eyes: Pupils are equal, round, and reactive to light. No scleral icterus.  Neck: Neck supple. Carotid bruit is not present. No thyromegaly present.  Cardiovascular: Normal rate, regular rhythm and intact distal pulses. Exam reveals no gallop and no friction rub.  Murmur (1/6 SEM) heard. +1 pitting BLE edema with chronic venous stasis changes; no calf TTP  Pulmonary/Chest: Effort normal and breath sounds normal. He has no wheezes. He has no rales. He exhibits no tenderness.  Prolonged expiratory phase b/l  Abdominal: Soft. Bowel sounds are normal. He exhibits no distension, no abdominal bruit, no pulsatile midline mass and no mass. There is no hepatomegaly. There is no tenderness. There is no rebound and no guarding.  Lymphadenopathy:    He  has no cervical adenopathy.  Neurological: He is alert and oriented to person, place, and time. He has  normal reflexes.  Skin: Skin is warm and dry. No rash noted.  Psychiatric: He has a normal mood and affect. His speech is slurred. He is agitated (at times). Cognition and memory are impaired (MMSE 10/30).  Speech unintelligible most times   MMSE - Mini Mental State Exam 04/26/2018 04/18/2017 12/10/2016  Orientation to time 0 2 0  Orientation to Place '1 4 2  ' Registration '3 3 3  ' Attention/ Calculation 0 0 0  Recall 0 2 0  Language- name 2 objects '2 2 2  ' Language- repeat 1 0 1  Language- follow 3 step command '3 3 2  ' Language- read & follow direction 0 0 1  Write a sentence 0 0 0  Copy design 0 0 0  Total score '10 16 11     ' Labs reviewed: Basic Metabolic Panel: Recent Labs    06/29/17 0858 09/30/17 0810 01/16/18 0916  NA 142 144 143  K 4.3 4.5 4.4  CL 100 102 102  CO2 37* 37* 34*  GLUCOSE 141* 84 150*  BUN '16 18 22  ' CREATININE 1.55* 1.41* 1.54*  CALCIUM 9.7 9.9 9.6  TSH  --   --  1.60   Liver Function Tests: Recent Labs    06/29/17 0858 09/30/17 0810 01/16/18 0916  AST 31  --  25  ALT 32 20 19  BILITOT 0.5  --  0.3  PROT 6.9  --  6.9   No results for input(s): LIPASE, AMYLASE in the last 8760 hours. No results for input(s): AMMONIA in the last 8760 hours. CBC: No results for input(s): WBC, NEUTROABS, HGB, HCT, MCV, PLT in the last 8760 hours. Lipid Panel: Recent Labs    06/29/17 0858 09/30/17 0810 01/16/18 0916  CHOL 151 147 155  HDL 39* 36* 30*  LDLCALC 89 87 90  TRIG 126 139 268*  CHOLHDL 3.9 4.1 5.2*   Lab Results  Component Value Date   HGBA1C 6.9 (H) 01/16/2018    Procedures since last visit: Ct Chest Wo Contrast  Result Date: 04/19/2018 CLINICAL DATA:  Follow-up lung nodule EXAM: CT CHEST WITHOUT CONTRAST TECHNIQUE: Multidetector CT imaging of the chest was performed following the standard protocol without IV contrast. COMPARISON:  Multiple previous exams: PET-CT 09/20/2017, CT of the chest from 08/04/2017 and 04/06/2017 FINDINGS:  Cardiovascular: Thoracic aorta demonstrates atherosclerotic calcifications without aneurysmal dilatation. Heavy coronary calcifications are seen. The heart is at the upper limits of normal in size. Mediastinum/Nodes: Thoracic inlet is within normal limits. No hilar or mediastinal adenopathy is noted. The esophagus is within normal limits as visualized. Lungs/Pleura: Small right pleural effusion is again identified. Some pleural plaquing is noted and stable. The lungs are well aerated with multiple small subpleural nodules identified. Largest of these lies along the minor fissure in the right lung measuring 17 mm in greatest dimension. This is stable from the previous exam is and has recently been shown to be not significantly hypermetabolic on PET-CT. No new sizable nodularity is identified. Upper Abdomen: Visualized upper abdomen is within normal limits. Musculoskeletal: Degenerative changes of the thoracic spine are noted. No other focal abnormality is seen. IMPRESSION: Chronic right pleural effusion as well as calcified pleural plaques. Bilateral pulmonary nodules more on the right than the left. The most dominant of these lies along the minor fissure and is stable over the past 8 months. Follow-up exam is recommended in 12  months to assess for stability. Electronically Signed   By: Inez Catalina M.D.   On: 04/19/2018 14:24    Assessment/Plan   ICD-10-CM   1. Severe dementia F03.90   2. Centrilobular emphysema ([place]) J43.2   3. Bilateral lower extremity edema R60.0   4. Pulmonary nodules R91.8   5. BPH without obstruction/lower urinary tract symptoms with elevated PSA N40.0   6. Essential hypertension I10   7. Mixed hyperlipidemia E78.2   8. History of stroke Z86.73   9. Type 2 diabetes mellitus with stage 3 chronic kidney disease, with long-term current use of insulin (HCC) E11.22    N18.3    Z79.4     RECOMMEND PLACEMENT IN SKILLED NURSING FACILITY - [facility]  Continue current  medications as ordered  Will call with lab results  F/u with urology Dr Jeffie Pollock as scheduled  Follow up in 3 mos with Janett Billow for dementia, DM, HTN.   Leslye Puccini S. Perlie Gold  [facility] [address] [place], [place] [address] [phone] Cell (Monday-Friday 8 AM - 5 PM) [phone] After 5 PM and follow prompts

## 2018-04-27 LAB — COMPLETE METABOLIC PANEL WITH GFR
AG Ratio: 1.6 (calc) (ref 1.0–2.5)
ALBUMIN MSPROF: 3.9 g/dL (ref 3.6–5.1)
ALKALINE PHOSPHATASE (APISO): 60 U/L (ref 40–115)
ALT: 19 U/L (ref 9–46)
AST: 27 U/L (ref 10–35)
BILIRUBIN TOTAL: 0.3 mg/dL (ref 0.2–1.2)
BUN/Creatinine Ratio: 13 (calc) (ref 6–22)
BUN: 19 mg/dL (ref 7–25)
CO2: 31 mmol/L (ref 20–32)
Calcium: 9.6 mg/dL (ref 8.6–10.3)
Chloride: 100 mmol/L (ref 98–110)
Creat: 1.45 mg/dL — ABNORMAL HIGH (ref 0.70–1.11)
GFR, EST AFRICAN AMERICAN: 47 mL/min/{1.73_m2} — AB (ref >=60)
GFR, Est Non African American: 41 mL/min/{1.73_m2} — ABNORMAL LOW (ref >=60)
GLUCOSE: 321 mg/dL — AB (ref 65–139)
Globulin: 2.5 g/dL (calc) (ref 1.9–3.7)
POTASSIUM: 4.2 mmol/L (ref 3.5–5.3)
Sodium: 140 mmol/L (ref 135–146)
Total Protein: 6.4 g/dL (ref 6.1–8.1)

## 2018-04-27 LAB — LIPID PANEL
Cholesterol: 167 mg/dL (ref <200)
HDL: 35 mg/dL — ABNORMAL LOW (ref >40)
LDL CHOLESTEROL (CALC): 97 mg/dL
Non-HDL Cholesterol (Calc): 132 mg/dL (calc) — ABNORMAL HIGH (ref <130)
TRIGLYCERIDES: 229 mg/dL — AB (ref <150)
Total CHOL/HDL Ratio: 4.8 (calc) (ref <5.0)

## 2018-04-27 LAB — CBC WITH DIFFERENTIAL/PLATELET
Basophils Absolute: 32 cells/uL (ref 0–200)
Basophils Relative: 0.6 %
Eosinophils Absolute: 119 cells/uL (ref 15–500)
Eosinophils Relative: 2.2 %
HCT: 32.2 % — ABNORMAL LOW (ref 38.5–50.0)
Hemoglobin: 10.5 g/dL — ABNORMAL LOW (ref 13.2–17.1)
Lymphs Abs: 1706 cells/uL (ref 850–3900)
MCH: 30.4 pg (ref 27.0–33.0)
MCHC: 32.6 g/dL (ref 32.0–36.0)
MCV: 93.3 fL (ref 80.0–100.0)
MONOS PCT: 13.6 %
MPV: 10 fL (ref 7.5–12.5)
NEUTROS PCT: 52 %
Neutro Abs: 2808 cells/uL (ref 1500–7800)
PLATELETS: 174 10*3/uL (ref 140–400)
RBC: 3.45 10*6/uL — AB (ref 4.20–5.80)
RDW: 13.1 % (ref 11.0–15.0)
Total Lymphocyte: 31.6 %
WBC mixed population: 734 cells/uL (ref 200–950)
WBC: 5.4 10*3/uL (ref 3.8–10.8)

## 2018-04-27 LAB — HEMOGLOBIN A1C
Hgb A1c MFr Bld: 8.6 % of total Hgb — ABNORMAL HIGH (ref <5.7)
Mean Plasma Glucose: 200 (calc)
eAG (mmol/L): 11.1 (calc)

## 2018-05-01 ENCOUNTER — Telehealth: Payer: Self-pay

## 2018-05-01 NOTE — Telephone Encounter (Signed)
Patient's daughter stated that patient takes 65 units of toujeo daily already. Patient's medication list was updated.

## 2018-05-01 NOTE — Telephone Encounter (Signed)
-----   Message from [place], [place] sent at 04/30/2018 12:50 AM EDT ----- Diabetes poorly controlled - increase toujeo 54 units daily; watch complex carbs; bad/LDL cholesterol at goal; blood counts reveal anemia but stable; other labs stable; follow up as scheduled

## 2018-05-02 LAB — HEMOGLOBIN A1C
EAG (MMOL/L): 8.4 (calc)
Hgb A1c MFr Bld: 6.9 % of total Hgb — ABNORMAL HIGH (ref <5.7)
Mean Plasma Glucose: 151 (calc)

## 2018-05-02 LAB — COMPLETE METABOLIC PANEL WITH GFR
AG Ratio: 1.5 (calc) (ref 1.0–2.5)
ALBUMIN MSPROF: 4.1 g/dL (ref 3.6–5.1)
ALKALINE PHOSPHATASE (APISO): 63 U/L (ref 40–115)
ALT: 19 U/L (ref 9–46)
AST: 25 U/L (ref 10–35)
BILIRUBIN TOTAL: 0.3 mg/dL (ref 0.2–1.2)
BUN / CREAT RATIO: 14 (calc) (ref 6–22)
BUN: 22 mg/dL (ref 7–25)
CHLORIDE: 102 mmol/L (ref 98–110)
CO2: 34 mmol/L — ABNORMAL HIGH (ref 20–32)
Calcium: 9.6 mg/dL (ref 8.6–10.3)
Creat: 1.54 mg/dL — ABNORMAL HIGH (ref 0.70–1.11)
GFR, Est African American: 44 mL/min/{1.73_m2} — ABNORMAL LOW (ref >=60)
GFR, Est Non African American: 38 mL/min/{1.73_m2} — ABNORMAL LOW (ref >=60)
GLOBULIN: 2.8 g/dL (ref 1.9–3.7)
GLUCOSE: 150 mg/dL — AB (ref 65–99)
Potassium: 4.4 mmol/L (ref 3.5–5.3)
SODIUM: 143 mmol/L (ref 135–146)
Total Protein: 6.9 g/dL (ref 6.1–8.1)

## 2018-05-02 LAB — LIPID PANEL
Cholesterol: 155 mg/dL (ref <200)
HDL: 30 mg/dL — ABNORMAL LOW (ref >40)
LDL CHOLESTEROL (CALC): 90 mg/dL
Non-HDL Cholesterol (Calc): 125 mg/dL (calc) (ref <130)
Total CHOL/HDL Ratio: 5.2 (calc) — ABNORMAL HIGH (ref <5.0)
Triglycerides: 268 mg/dL — ABNORMAL HIGH (ref <150)

## 2018-05-02 LAB — TSH: TSH: 1.6 mIU/L (ref 0.40–4.50)

## 2018-05-15 ENCOUNTER — Ambulatory Visit: Payer: Medicare HMO | Admitting: Podiatry

## 2018-05-15 DIAGNOSIS — I890 Lymphedema, not elsewhere classified: Secondary | ICD-10-CM | POA: Diagnosis not present

## 2018-05-15 DIAGNOSIS — M2041 Other hammer toe(s) (acquired), right foot: Secondary | ICD-10-CM | POA: Diagnosis not present

## 2018-05-15 DIAGNOSIS — M2042 Other hammer toe(s) (acquired), left foot: Secondary | ICD-10-CM

## 2018-05-15 DIAGNOSIS — B351 Tinea unguium: Secondary | ICD-10-CM

## 2018-05-15 DIAGNOSIS — E1159 Type 2 diabetes mellitus with other circulatory complications: Secondary | ICD-10-CM | POA: Diagnosis not present

## 2018-05-15 DIAGNOSIS — M2141 Flat foot [pes planus] (acquired), right foot: Secondary | ICD-10-CM

## 2018-05-15 DIAGNOSIS — M2142 Flat foot [pes planus] (acquired), left foot: Secondary | ICD-10-CM

## 2018-05-15 NOTE — Progress Notes (Signed)
Subjective:  Patient ID: [name], male    DOB: Feb 23, 1922,  MRN: [id number]  Chief Complaint  Patient presents with  . debride    BL nail trimming -FBS: 120 x 1 wk A1C: 8.1 PCP: Eulas Post x 3 wks -pt interested in Diabetic shoes    84 y.o. male presents  for diabetic foot care. Last AMBS was 120. Denies numbness and tingling in their feet. Denies cramping in legs and thighs.  Review of Systems: Negative except as noted in the HPI. Denies N/V/F/Ch.  Past Medical History:  Diagnosis Date  . Arthritis   . CAD (coronary artery disease)    s/p DES to LAD 06/2013  . Depression   . Dyslipidemia   . Glaucoma   . Hyperglycemia 04/17/2014  . Hyperlipidemia   . Hypertension   . Stroke ([place])   . Type 2 diabetes mellitus (HCC)     Current Outpatient Medications:  .  albuterol (PROVENTIL HFA;VENTOLIN HFA) 108 (90 Base) MCG/ACT inhaler, INHALE TWO PUFFS EVERY 4-6 HOURS AS NEEDED FOR DIFFICULTY BREATHING,WHEEZING OR COUGH, Disp: 18 g, Rfl: 3 .  Alcohol Swabs (B-D SINGLE USE SWABS REGULAR) PADS, USE AS DIRECTED WITH CHECKING BLOOD SUGAR THREE TIMES DAILY, Disp: 300 each, Rfl: 3 .  amLODipine (NORVASC) 5 MG tablet, Take 1 tablet (5 mg total) by mouth daily., Disp: 90 tablet, Rfl: 1 .  aspirin EC 81 MG tablet, Take 1 tablet (81 mg total) by mouth daily., Disp: 150 tablet, Rfl: 2 .  Blood Glucose Calibration (ACCU-CHEK AVIVA) SOLN, Use as Directed. Dx: E11.51, Disp: 3 each, Rfl: 3 .  Blood Glucose Monitoring Suppl (ACCU-CHEK AVIVA PLUS) w/Device KIT, Use as directed with checking blood sugar three times daily. Dx: E11.51, Disp: 1 kit, Rfl: 0 .  escitalopram (LEXAPRO) 5 MG tablet, TAKE 1 TABLET EVERY DAY, Disp: 90 tablet, Rfl: 1 .  fluticasone (FLONASE) 50 MCG/ACT nasal spray, Place 1 spray into both nostrils daily., Disp: 48 g, Rfl: 1 .  furosemide (LASIX) 20 MG tablet, Take 1 tablet (20 mg total) by mouth daily as needed for edema., Disp: 30 tablet, Rfl: 3 .  glucose blood (ACCU-CHEK AVIVA)  test strip, Use to test blood sugar three times daily. Dx: E11.51, Disp: 300 each, Rfl: 3 .  hydrochlorothiazide (HYDRODIURIL) 25 MG tablet, TAKE 1 TABLET (25 MG TOTAL) BY MOUTH DAILY., Disp: 90 tablet, Rfl: 1 .  Insulin Glargine (TOUJEO SOLOSTAR) 300 UNIT/ML SOPN, Inject 65 Units into the skin daily., Disp: , Rfl:  .  Insulin Pen Needle (B-D UF III MINI PEN NEEDLES) 31G X 5 MM MISC, USE AS DIRECTED., Disp: 270 each, Rfl: 11 .  Lancet Devices (ACCU-CHEK SOFTCLIX) lancets, Use as instructed with checking blood sugar three times daily. Dx: E11.51, Disp: 300 each, Rfl: 3 .  Loratadine 10 MG CAPS, Take 1 capsule by mouth daily., Disp: , Rfl:  .  losartan (COZAAR) 50 MG tablet, Take 1 tablet (50 mg total) by mouth daily., Disp: 90 tablet, Rfl: 1 .  Memantine HCl-Donepezil HCl (NAMZARIC) 28-10 MG CP24, Take 1 capsule by mouth daily., Disp: 90 capsule, Rfl: 1 .  methylPREDNISolone (MEDROL DOSEPAK) 4 MG TBPK tablet, Take as directed for emphysema flare, Disp: 21 tablet, Rfl: 0 .  Multiple Vitamins-Minerals (SENTRY SENIOR) TABS, Take 1 tablet by mouth daily., Disp: , Rfl:  .  simvastatin (ZOCOR) 20 MG tablet, Take 1 tablet (20 mg total) by mouth daily., Disp: 90 tablet, Rfl: 1  Social History   Tobacco Use  Smoking  Status Former Smoker  . Types: Cigarettes  . Last attempt to quit: 08/02/1968  . Years since quitting: 49.8  Smokeless Tobacco Never Used    Allergies  Allergen Reactions  . Lisinopril Cough   Objective:  There were no vitals filed for this visit. There is no height or weight on file to calculate BMI. Constitutional Well developed. Well nourished.  Vascular Dorsalis pedis pulses present 1+ bilaterally  Posterior tibial pulses absent bilaterally  Pedal hair growth absent. Forefoot edema bilat Capillary refill normal to all digits.  No cyanosis or clubbing noted.  Neurologic Normal speech. Oriented to person, place, and time. Epicritic sensation to light touch grossly present  bilaterally. Protective sensation with 5.07 monofilament  present bilaterally. Vibratory sensation present bilaterally.  Dermatologic Nails elongated, thickened, dystrophic. No open wounds. Lymphedema skin changes BLE  Orthopedic: Normal joint ROM without pain or crepitus bilaterally. Pes planus bilat Hammertoes bilat No bony tenderness.   Assessment:   1. Type 2 diabetes mellitus with vascular disease ([place])   2. Onychomycosis   3. Lymphedema    Plan:  Patient was evaluated and treated and all questions answered.  Diabetes with PAD, Onychomycosis -Educated on diabetic footcare. Diabetic risk level 1 -Nails x10 debrided sharply and manually with large nail nipper and rotary burr.   Procedure: Nail Debridement Rationale: Patient meets criteria for routine foot care due to PAD Type of Debridement: manual, sharp debridement. Instrumentation: Nail nipper, rotary burr. Number of Nails: 10   Lymphedema -Rx for compression socks. -May benefit from lymphedema pumps in the future No follow-ups on file.

## 2018-05-17 ENCOUNTER — Ambulatory Visit: Payer: Medicare HMO | Admitting: Podiatry

## 2018-05-17 ENCOUNTER — Telehealth: Payer: Self-pay | Admitting: Internal Medicine

## 2018-05-17 NOTE — Telephone Encounter (Signed)
I spoke with the patient's daughter, [name], about free exercise classes at [facility] and offered to mail her a brochure.  I also explained that she would have to pay out of pocket for someone to assist him with bathing.  She agreed to me sending information for private duty care agencies.  I also mentioned [email or web address] as an option. VDM (DD)

## 2018-05-24 ENCOUNTER — Ambulatory Visit: Payer: Medicare HMO | Admitting: Orthotics

## 2018-05-24 ENCOUNTER — Other Ambulatory Visit: Payer: Medicare HMO | Admitting: Orthotics

## 2018-05-24 DIAGNOSIS — M2042 Other hammer toe(s) (acquired), left foot: Secondary | ICD-10-CM

## 2018-05-24 DIAGNOSIS — E1159 Type 2 diabetes mellitus with other circulatory complications: Secondary | ICD-10-CM

## 2018-05-24 DIAGNOSIS — M2041 Other hammer toe(s) (acquired), right foot: Secondary | ICD-10-CM

## 2018-05-24 DIAGNOSIS — M2142 Flat foot [pes planus] (acquired), left foot: Secondary | ICD-10-CM

## 2018-05-24 DIAGNOSIS — Z794 Long term (current) use of insulin: Secondary | ICD-10-CM

## 2018-05-24 DIAGNOSIS — E1122 Type 2 diabetes mellitus with diabetic chronic kidney disease: Secondary | ICD-10-CM

## 2018-05-24 DIAGNOSIS — N183 Chronic kidney disease, stage 3 (moderate): Secondary | ICD-10-CM

## 2018-05-24 DIAGNOSIS — M2141 Flat foot [pes planus] (acquired), right foot: Secondary | ICD-10-CM

## 2018-05-29 ENCOUNTER — Telehealth: Payer: Self-pay | Admitting: *Deleted

## 2018-05-29 NOTE — Telephone Encounter (Signed)
Received Paperwork from [place] [facility] [place] [phone] For therapeutic footwear. To be faxed back to [phone]. Printed last OV note and placed in Dr. Vale Haven folder to review and sign.

## 2018-05-30 ENCOUNTER — Other Ambulatory Visit: Payer: Medicare HMO | Admitting: Orthotics

## 2018-06-09 NOTE — Progress Notes (Signed)

## 2018-06-20 ENCOUNTER — Ambulatory Visit (INDEPENDENT_AMBULATORY_CARE_PROVIDER_SITE_OTHER): Payer: Medicare HMO | Admitting: Orthotics

## 2018-06-20 DIAGNOSIS — M2041 Other hammer toe(s) (acquired), right foot: Secondary | ICD-10-CM | POA: Diagnosis not present

## 2018-06-20 DIAGNOSIS — M2142 Flat foot [pes planus] (acquired), left foot: Secondary | ICD-10-CM | POA: Diagnosis not present

## 2018-06-20 DIAGNOSIS — Z794 Long term (current) use of insulin: Secondary | ICD-10-CM

## 2018-06-20 DIAGNOSIS — M2042 Other hammer toe(s) (acquired), left foot: Secondary | ICD-10-CM

## 2018-06-20 DIAGNOSIS — E1122 Type 2 diabetes mellitus with diabetic chronic kidney disease: Secondary | ICD-10-CM

## 2018-06-20 DIAGNOSIS — N183 Chronic kidney disease, stage 3 (moderate): Secondary | ICD-10-CM

## 2018-06-20 DIAGNOSIS — E1159 Type 2 diabetes mellitus with other circulatory complications: Secondary | ICD-10-CM | POA: Diagnosis not present

## 2018-06-20 DIAGNOSIS — M2141 Flat foot [pes planus] (acquired), right foot: Secondary | ICD-10-CM | POA: Diagnosis not present

## 2018-06-20 NOTE — Progress Notes (Signed)

## 2018-06-23 ENCOUNTER — Other Ambulatory Visit: Payer: Self-pay | Admitting: *Deleted

## 2018-06-23 DIAGNOSIS — N183 Chronic kidney disease, stage 3 unspecified: Secondary | ICD-10-CM

## 2018-06-23 DIAGNOSIS — F03B18 Unspecified dementia, moderate, with other behavioral disturbance: Secondary | ICD-10-CM

## 2018-06-23 DIAGNOSIS — I10 Essential (primary) hypertension: Secondary | ICD-10-CM

## 2018-06-23 DIAGNOSIS — E1122 Type 2 diabetes mellitus with diabetic chronic kidney disease: Secondary | ICD-10-CM

## 2018-06-23 DIAGNOSIS — F0391 Unspecified dementia with behavioral disturbance: Secondary | ICD-10-CM

## 2018-06-23 DIAGNOSIS — Z794 Long term (current) use of insulin: Principal | ICD-10-CM

## 2018-06-23 MED ORDER — AMLODIPINE BESYLATE 5 MG PO TABS: 5.0000 mg | ORAL_TABLET | Freq: Every day | ORAL | 1 refills | Status: DC

## 2018-06-23 MED ORDER — GLUCOSE BLOOD VI STRP: ORAL_STRIP | 1 refills | Status: DC

## 2018-06-23 MED ORDER — MEMANTINE HCL-DONEPEZIL HCL ER 28-10 MG PO CP24: 1.0000 | ORAL_CAPSULE | Freq: Every day | ORAL | 1 refills | Status: DC

## 2018-06-23 MED ORDER — SIMVASTATIN 20 MG PO TABS: 20.0000 mg | ORAL_TABLET | Freq: Every day | ORAL | 1 refills | Status: DC

## 2018-06-23 MED ORDER — FLUTICASONE PROPIONATE 50 MCG/ACT NA SUSP: 1.0000 | Freq: Every day | NASAL | 1 refills | Status: DC

## 2018-06-23 NOTE — Telephone Encounter (Signed)
[facility] 

## 2018-07-18 ENCOUNTER — Ambulatory Visit (INDEPENDENT_AMBULATORY_CARE_PROVIDER_SITE_OTHER): Payer: Medicare HMO | Admitting: Nurse Practitioner

## 2018-07-18 ENCOUNTER — Ambulatory Visit
Admission: RE | Admit: 2018-07-18 | Discharge: 2018-07-18 | Disposition: A | Discharge status: Home / Self Care | Payer: Medicare HMO | Source: Ambulatory Visit | Attending: Nurse Practitioner | Admitting: Nurse Practitioner

## 2018-07-18 ENCOUNTER — Telehealth: Payer: Self-pay

## 2018-07-18 ENCOUNTER — Encounter: Payer: Self-pay | Admitting: Nurse Practitioner

## 2018-07-18 VITALS — BP 160/78 | HR 80 | Temp 98.5°F | Ht 71.0 in | Wt 197.0 lb

## 2018-07-18 DIAGNOSIS — N183 Chronic kidney disease, stage 3 unspecified: Secondary | ICD-10-CM

## 2018-07-18 DIAGNOSIS — Z794 Long term (current) use of insulin: Secondary | ICD-10-CM

## 2018-07-18 DIAGNOSIS — J209 Acute bronchitis, unspecified: Secondary | ICD-10-CM | POA: Diagnosis not present

## 2018-07-18 DIAGNOSIS — E1122 Type 2 diabetes mellitus with diabetic chronic kidney disease: Secondary | ICD-10-CM

## 2018-07-18 DIAGNOSIS — R05 Cough: Secondary | ICD-10-CM | POA: Diagnosis not present

## 2018-07-18 DIAGNOSIS — J440 Chronic obstructive pulmonary disease with acute lower respiratory infection: Secondary | ICD-10-CM

## 2018-07-18 MED ORDER — DOXYCYCLINE HYCLATE 100 MG PO TABS: 100.0000 mg | ORAL_TABLET | Freq: Two times a day (BID) | ORAL | 0 refills | Status: DC

## 2018-07-18 MED ORDER — ACCU-CHEK AVIVA PLUS W/DEVICE KIT: PACK | 0 refills | Status: DC

## 2018-07-18 MED ORDER — PREDNISONE 10 MG (21) PO TBPK: ORAL_TABLET | ORAL | 0 refills | Status: DC

## 2018-07-18 MED ORDER — INSULIN PEN NEEDLE 31G X 5 MM MISC: 11 refills | Status: DC

## 2018-07-18 NOTE — Telephone Encounter (Signed)
Patient was seen in office today. At the end of visit patient's daughter expressed interest in her father having a walker vs the cain he is currently using. [name] thinks a walker will make her father more stable.   Please advise

## 2018-07-18 NOTE — Progress Notes (Signed)
Careteam: Patient Care Team: [name], NP as PCP - General (Geriatric Medicine) Macarthur Critchley, [place] as Referring Physician (Optometry) Delice Lesch Lezlie Octave, MD as Consulting Physician (Neurology)  Advanced Directive information    Allergies  Allergen Reactions  . Lisinopril Cough    Chief Complaint  Patient presents with  . Acute Visit    Cough x 1 week. Patient also with ongoing SOB, O2 was 88, after waiting 1-2 min increased to 91 %      HPI: Patient is a 84 y.o. male seen in the office today due to cough.  Pt with hx of severe dementia, pulmonary nodules with hx of cough. He had a PET scan in Feb 2019 that revealed right minor fissure ovoid nodular density 17 mm suspicious for intrapulmonary lymph node; several other subcm nodules in RUL and RLL; no FDG uptake; small right pleural effusion; mildly enlarged prostate with FDG uptake at right lateral peripheral zone; b/l renal and left hepatic lobe cysts; aortic atherosclerosis. CT chest in Sept 2019 revealed stable pulmonary nodules and stable chronic right pleural effusion.  Daughter thought he had a cold and gave him some robitussin but now he has more mucous coming up.  More shortness of breath and wheezing with activity. Been using albuterol which helps but only for a short time. No fevers.  Overall been going on for ~2 weeks.     Needs a new blood sugar meter Apparently was eating sugar packs- daughter has hidden this and hopefully blood sugars have improved since.   Review of Systems:  Review of Systems  Unable to perform ROS: Dementia    Past Medical History:  Diagnosis Date  . Arthritis   . CAD (coronary artery disease)    s/p DES to LAD 06/2013  . Depression   . Dyslipidemia   . Glaucoma   . Hyperglycemia 04/17/2014  . Hyperlipidemia   . Hypertension   . Stroke ([place])   . Type 2 diabetes mellitus ([place])    Past Surgical History:  Procedure Laterality Date  . CARDIAC CATHETERIZATION  06/2013   DES to  LAD by Dr Tamala Julian  . Right Cataract     Social History:   reports that he quit smoking about 49 years ago. His smoking use included cigarettes. He has never used smokeless tobacco. He reports that he does not drink alcohol or use drugs.  Family History  Problem Relation Age of Onset  . Arthritis Mother   . Breast cancer Mother   . Arthritis Father   . Dementia Father   . Diabetes Other   . Arthritis Other   . Colon cancer Other   . Stroke Other   . Diabetes type II Sister   . Suicidality Brother   . Diabetes type I Brother   . Diabetes type I Brother   . Diabetes type I Brother   . Diabetes type II Sister   . Breast cancer Daughter   . Emphysema Daughter     Medications: Patient's Medications  New Prescriptions   No medications on file  Previous Medications   ALBUTEROL (PROVENTIL HFA;VENTOLIN HFA) 108 (90 BASE) MCG/ACT INHALER    INHALE TWO PUFFS EVERY 4-6 HOURS AS NEEDED FOR DIFFICULTY BREATHING,WHEEZING OR COUGH   ALCOHOL SWABS (B-D SINGLE USE SWABS REGULAR) PADS    USE AS DIRECTED WITH CHECKING BLOOD SUGAR THREE TIMES DAILY   AMLODIPINE (NORVASC) 5 MG TABLET    Take 1 tablet (5 mg total) by mouth daily.  ASPIRIN EC 81 MG TABLET    Take 1 tablet (81 mg total) by mouth daily.   BLOOD GLUCOSE CALIBRATION (ACCU-CHEK AVIVA) SOLN    Use as Directed. Dx: E11.51   BLOOD GLUCOSE MONITORING SUPPL (ACCU-CHEK AVIVA PLUS) W/DEVICE KIT    Use as directed with checking blood sugar three times daily. Dx: E11.51   ESCITALOPRAM (LEXAPRO) 5 MG TABLET    TAKE 1 TABLET EVERY DAY   FLUTICASONE (FLONASE) 50 MCG/ACT NASAL SPRAY    Place 1 spray into both nostrils daily.   FUROSEMIDE (LASIX) 20 MG TABLET    Take 1 tablet (20 mg total) by mouth daily as needed for edema.   GLUCOSE BLOOD (ACCU-CHEK AVIVA) TEST STRIP    Use to test blood sugar three times daily. Dx: E11.51   HYDROCHLOROTHIAZIDE (HYDRODIURIL) 25 MG TABLET    TAKE 1 TABLET (25 MG TOTAL) BY MOUTH DAILY.   INSULIN GLARGINE (TOUJEO  SOLOSTAR) 300 UNIT/ML SOPN    Inject 65 Units into the skin daily.   INSULIN PEN NEEDLE (B-D UF III MINI PEN NEEDLES) 31G X 5 MM MISC    USE AS DIRECTED.   LANCET DEVICES (ACCU-CHEK SOFTCLIX) LANCETS    Use as instructed with checking blood sugar three times daily. Dx: E11.51   LORATADINE 10 MG CAPS    Take 1 capsule by mouth daily.   LOSARTAN (COZAAR) 50 MG TABLET    Take 1 tablet (50 mg total) by mouth daily.   MEMANTINE HCL-DONEPEZIL HCL (NAMZARIC) 28-10 MG CP24    Take 1 capsule by mouth daily.   MULTIPLE VITAMINS-MINERALS (SENTRY SENIOR) TABS    Take 1 tablet by mouth daily.   SIMVASTATIN (ZOCOR) 20 MG TABLET    Take 1 tablet (20 mg total) by mouth daily.  Modified Medications   No medications on file  Discontinued Medications   METHYLPREDNISOLONE (MEDROL DOSEPAK) 4 MG TBPK TABLET    Take as directed for emphysema flare     Physical Exam:  Vitals:   07/18/18 1138  BP: (!) 160/78  Pulse: 80  Temp: 98.5 F (36.9 C)  TempSrc: Oral  SpO2: 91%  Weight: 197 lb (89.4 kg)  Height: _0  (1.803 m)   Body mass index is 27.48 kg/m.  Physical Exam Constitutional:      Appearance: He is well-developed.  Eyes:     General: No scleral icterus.    Pupils: Pupils are equal, round, and reactive to light.  Neck:     Musculoskeletal: Neck supple.     Thyroid: No thyromegaly.     Vascular: No carotid bruit.  Cardiovascular:     Rate and Rhythm: Normal rate and regular rhythm.     Heart sounds: Murmur (1/6 SEM) present. No friction rub. No gallop.   Pulmonary:     Effort: Pulmonary effort is normal.     Breath sounds: Normal breath sounds. No wheezing or rales.  Chest:     Chest wall: No tenderness.  Abdominal:     General: Bowel sounds are normal. There is no distension or abdominal bruit.     Palpations: Abdomen is soft. There is no hepatomegaly, mass or pulsatile mass.     Tenderness: There is no abdominal tenderness. There is no guarding or rebound.  Lymphadenopathy:      Cervical: No cervical adenopathy.  Skin:    General: Skin is warm and dry.     Findings: No rash.  Neurological:     Mental Status: He is alert  and oriented to person, place, and time.     Deep Tendon Reflexes: Reflexes are normal and symmetric.  Psychiatric:        Speech: Speech is slurred.        Behavior: Behavior is agitated (at times).        Cognition and Memory: Memory is impaired (MMSE 10/30).     Comments: Speech unintelligible most times   \  Labs reviewed: Basic Metabolic Panel: Recent Labs    09/30/17 0810 01/16/18 0916 04/26/18 0827  NA 144 143 140  K 4.5 4.4 4.2  CL 102 102 100  CO2 37* 34* 31  GLUCOSE 84 150* 321*  BUN _0 CREATININE 1.41* 1.54* 1.45*  CALCIUM 9.9 9.6 9.6  TSH  --  1.60  --    Liver Function Tests: Recent Labs    09/30/17 0810 01/16/18 0916 04/26/18 0827  AST  --  25 27  ALT _1 BILITOT  --  0.3 0.3  PROT  --  6.9 6.4   No results for input(s): LIPASE, AMYLASE in the last 8760 hours. No results for input(s): AMMONIA in the last 8760 hours. CBC: Recent Labs    04/26/18 0827  WBC 5.4  NEUTROABS 2,808  HGB 10.5*  HCT 32.2*  MCV 93.3  PLT 174   Lipid Panel: Recent Labs    09/30/17 0810 01/16/18 0916 04/26/18 0827  CHOL 147 155 167  HDL 36* 30* 35*  LDLCALC 87 90 97  TRIG 139 268* 229*  CHOLHDL 4.1 5.2* 4.8   TSH: Recent Labs    01/16/18 0916  TSH 1.60   A1C: Lab Results  Component Value Date   HGBA1C 8.6 (H) 04/26/2018     Assessment/Plan 1. Acute bronchitis with COPD ([place]) - DG Chest 2 View rule out pneumonia due to drop in O2 level and shortness of breath - predniSONE (STERAPRED UNI-PAK 21 TAB) 10 MG (21) TBPK tablet; Use as directed  Dispense: 21 tablet; Refill: 0 - doxycycline (VIBRA-TABS) 100 MG tablet; Take 1 tablet (100 mg total) by mouth 2 (two) times daily.  Dispense: 14 tablet; Refill: 0 - CBC with Differential/Platelets -encouraged to increase hydration -mucinex DM by mouth  twice daily for 1 week -to use albuterol as needed   2. Type 2 diabetes mellitus with stage 3 chronic kidney disease, with long-term current use of insulin (HCC) - Blood Glucose Monitoring Suppl (ACCU-CHEK AVIVA PLUS) w/Device KIT; Use as directed with checking blood sugar three times daily. Dx: E11.51  Dispense: 1 kit; Refill: 0 - Insulin Pen Needle (B-D UF III MINI PEN NEEDLES) 31G X 5 MM MISC; USE AS DIRECTED.  Dispense: 270 each; Refill: 11  Next appt: 07/18/2018 [name]. [place], [facility] [phone]

## 2018-07-18 NOTE — Telephone Encounter (Signed)
He is following up in 2 weeks we will discuss this more at that time, may need referral to home health

## 2018-07-18 NOTE — Patient Instructions (Signed)
mucinex DM by mouth twice daily with full glass of water for 1 week Increase hydration Doxycycline 100 mg by mouth twice daily for 1 week- take with food to avoid GI upset Can use probiotic twice daily as well  Prednisone taper as directed To continue to use albuterol as needed wheezing, cough, shortness of breath

## 2018-07-19 ENCOUNTER — Other Ambulatory Visit: Payer: Medicare HMO

## 2018-07-19 DIAGNOSIS — J440 Chronic obstructive pulmonary disease with acute lower respiratory infection: Secondary | ICD-10-CM | POA: Diagnosis not present

## 2018-07-19 DIAGNOSIS — J209 Acute bronchitis, unspecified: Secondary | ICD-10-CM | POA: Diagnosis not present

## 2018-07-19 LAB — CBC WITH DIFFERENTIAL/PLATELET
Absolute Monocytes: 652 cells/uL (ref 200–950)
BASOS PCT: 0.4 %
Basophils Absolute: 20 cells/uL (ref 0–200)
EOS ABS: 172 {cells}/uL (ref 15–500)
EOS PCT: 3.5 %
HCT: 34.3 % — ABNORMAL LOW (ref 38.5–50.0)
HEMOGLOBIN: 11.6 g/dL — AB (ref 13.2–17.1)
Lymphs Abs: 2102 cells/uL (ref 850–3900)
MCH: 31.4 pg (ref 27.0–33.0)
MCHC: 33.8 g/dL (ref 32.0–36.0)
MCV: 92.7 fL (ref 80.0–100.0)
MONOS PCT: 13.3 %
MPV: 10 fL (ref 7.5–12.5)
NEUTROS ABS: 1955 {cells}/uL (ref 1500–7800)
Neutrophils Relative %: 39.9 %
PLATELETS: 187 10*3/uL (ref 140–400)
RBC: 3.7 10*6/uL — AB (ref 4.20–5.80)
RDW: 12.6 % (ref 11.0–15.0)
TOTAL LYMPHOCYTE: 42.9 %
WBC: 4.9 10*3/uL (ref 3.8–10.8)

## 2018-07-19 NOTE — Telephone Encounter (Signed)
Discussed with patient's daughter in person. Patient was in office to get labs

## 2018-07-20 DIAGNOSIS — H5210 Myopia, unspecified eye: Secondary | ICD-10-CM | POA: Diagnosis not present

## 2018-07-31 ENCOUNTER — Ambulatory Visit: Payer: Medicare HMO | Admitting: Nurse Practitioner

## 2018-08-01 ENCOUNTER — Telehealth: Payer: Self-pay | Admitting: *Deleted

## 2018-08-01 NOTE — Telephone Encounter (Signed)
Daughter called and stated pt is changing insurance and his new insurance will not cover him coming to [facility]. PCP name taking off the chart.

## 2018-08-03 ENCOUNTER — Ambulatory Visit: Payer: Medicare HMO | Admitting: Podiatry

## 2018-08-15 ENCOUNTER — Ambulatory Visit: Payer: Medicare HMO | Admitting: Nurse Practitioner

## 2018-08-31 ENCOUNTER — Encounter: Payer: Self-pay | Admitting: Family

## 2018-08-31 ENCOUNTER — Ambulatory Visit (INDEPENDENT_AMBULATORY_CARE_PROVIDER_SITE_OTHER): Payer: Commercial Managed Care - HMO | Admitting: Family

## 2018-08-31 VITALS — BP 142/70 | HR 76 | Temp 98.3°F | Ht 71.0 in | Wt 192.6 lb

## 2018-08-31 DIAGNOSIS — Z794 Long term (current) use of insulin: Secondary | ICD-10-CM

## 2018-08-31 DIAGNOSIS — E1122 Type 2 diabetes mellitus with diabetic chronic kidney disease: Secondary | ICD-10-CM

## 2018-08-31 DIAGNOSIS — F32A Depression, unspecified: Secondary | ICD-10-CM

## 2018-08-31 DIAGNOSIS — F329 Major depressive disorder, single episode, unspecified: Secondary | ICD-10-CM | POA: Diagnosis not present

## 2018-08-31 DIAGNOSIS — I10 Essential (primary) hypertension: Secondary | ICD-10-CM

## 2018-08-31 DIAGNOSIS — R0989 Other specified symptoms and signs involving the circulatory and respiratory systems: Secondary | ICD-10-CM

## 2018-08-31 DIAGNOSIS — F03B18 Unspecified dementia, moderate, with other behavioral disturbance: Secondary | ICD-10-CM

## 2018-08-31 DIAGNOSIS — R2681 Unsteadiness on feet: Secondary | ICD-10-CM | POA: Diagnosis not present

## 2018-08-31 DIAGNOSIS — E782 Mixed hyperlipidemia: Secondary | ICD-10-CM

## 2018-08-31 DIAGNOSIS — R600 Localized edema: Secondary | ICD-10-CM

## 2018-08-31 DIAGNOSIS — W19XXXA Unspecified fall, initial encounter: Secondary | ICD-10-CM

## 2018-08-31 DIAGNOSIS — N183 Chronic kidney disease, stage 3 (moderate): Secondary | ICD-10-CM

## 2018-08-31 DIAGNOSIS — Y92009 Unspecified place in unspecified non-institutional (private) residence as the place of occurrence of the external cause: Secondary | ICD-10-CM

## 2018-08-31 DIAGNOSIS — F0391 Unspecified dementia with behavioral disturbance: Secondary | ICD-10-CM

## 2018-08-31 MED ORDER — GLUCOSE BLOOD VI STRP: ORAL_STRIP | 1 refills | Status: DC

## 2018-08-31 MED ORDER — ESCITALOPRAM OXALATE 5 MG PO TABS: ORAL_TABLET | ORAL | 1 refills | Status: DC

## 2018-08-31 MED ORDER — ALBUTEROL SULFATE HFA 108 (90 BASE) MCG/ACT IN AERS: INHALATION_SPRAY | RESPIRATORY_TRACT | 3 refills | Status: DC

## 2018-08-31 MED ORDER — HYDROCHLOROTHIAZIDE 25 MG PO TABS: 25.0000 mg | ORAL_TABLET | Freq: Every day | ORAL | 1 refills | Status: DC

## 2018-08-31 MED ORDER — MEMANTINE HCL-DONEPEZIL HCL ER 28-10 MG PO CP24: 1.0000 | ORAL_CAPSULE | Freq: Every day | ORAL | 1 refills | Status: DC

## 2018-08-31 MED ORDER — INSULIN PEN NEEDLE 31G X 5 MM MISC: 11 refills | Status: DC

## 2018-08-31 MED ORDER — LOSARTAN POTASSIUM 50 MG PO TABS: 50.0000 mg | ORAL_TABLET | Freq: Every day | ORAL | 1 refills | Status: DC

## 2018-08-31 MED ORDER — SIMVASTATIN 20 MG PO TABS: 20.0000 mg | ORAL_TABLET | Freq: Every day | ORAL | 1 refills | Status: DC

## 2018-08-31 MED ORDER — AMLODIPINE BESYLATE 5 MG PO TABS: 5.0000 mg | ORAL_TABLET | Freq: Every day | ORAL | 1 refills | Status: DC

## 2018-08-31 MED ORDER — FUROSEMIDE 20 MG PO TABS: 20.0000 mg | ORAL_TABLET | Freq: Every day | ORAL | 3 refills | Status: DC | PRN

## 2018-08-31 MED ORDER — LORATADINE 10 MG PO CAPS: 1.0000 | ORAL_CAPSULE | Freq: Every day | ORAL | 3 refills | Status: DC

## 2018-08-31 NOTE — Progress Notes (Deleted)
Provider: Marlowe Sax FNP-C  No primary care provider on file.  Patient Care Team: Macarthur Critchley, [place] as Referring Physician (Optometry) Delice Lesch Lezlie Octave, MD as Consulting Physician (Neurology)  Extended Emergency Contact Information Primary Emergency Contact: [facility] Address: [address]          [place], [place] [address] [name] of [place] Phone: [phone] Relation: Daughter Secondary Emergency Contact: [name] [place] of [place] Phone: [phone] Mobile Phone: [phone] Relation: Other   Goals of care: Advanced Directive information Advanced Directives 08/31/2018  Does Patient Have a Medical Advance Directive? No  Type of Advance Directive -  Does patient want to make changes to medical advance directive? -  Copy of [place] in Chart? -  Would patient like information on creating a medical advance directive? No - Patient declined     Chief Complaint  Patient presents with  . Acute Visit    Patient c/o of cold symptoms for about a week. He is taking tussin dm and alkaseltzer cold medicine. Patient seems to feel a little better today.   [name] other    Patient daughter is interested in getting patient a walker    HPI:  Pt is a 85 y.o. male seen today for an acute visit for evaluation of    Past Medical History:  Diagnosis Date  . Arthritis   . CAD (coronary artery disease)    s/p DES to LAD 06/2013  . Depression   . Dyslipidemia   . Glaucoma   . Hyperglycemia 04/17/2014  . Hyperlipidemia   . Hypertension   . Stroke ([place])   . Type 2 diabetes mellitus ([place])    Past Surgical History:  Procedure Laterality Date  . CARDIAC CATHETERIZATION  06/2013   DES to LAD by Dr Tamala Julian  . Right Cataract      Allergies  Allergen Reactions  . Lisinopril Cough    Outpatient Encounter Medications as of 08/31/2018  Medication Sig  . albuterol (PROVENTIL HFA;VENTOLIN HFA) 108 (90 Base) MCG/ACT inhaler INHALE TWO PUFFS  EVERY 4-6 HOURS AS NEEDED FOR DIFFICULTY BREATHING,WHEEZING OR COUGH  . Alcohol Swabs (B-D SINGLE USE SWABS REGULAR) PADS USE AS DIRECTED WITH CHECKING BLOOD SUGAR THREE TIMES DAILY  . amLODipine (NORVASC) 5 MG tablet Take 1 tablet (5 mg total) by mouth daily.  [name] aspirin EC 81 MG tablet Take 1 tablet (81 mg total) by mouth daily.  . Blood Glucose Calibration (ACCU-CHEK AVIVA) SOLN Use as Directed. Dx: E11.51  . Blood Glucose Monitoring Suppl (ACCU-CHEK AVIVA PLUS) w/Device KIT Use as directed with checking blood sugar three times daily. Dx: E11.51  . escitalopram (LEXAPRO) 5 MG tablet TAKE 1 TABLET EVERY DAY  . furosemide (LASIX) 20 MG tablet Take 1 tablet (20 mg total) by mouth daily as needed for edema.  [name] glucose blood (ACCU-CHEK AVIVA) test strip Use to test blood sugar three times daily. Dx: E11.51  . hydrochlorothiazide (HYDRODIURIL) 25 MG tablet Take 1 tablet (25 mg total) by mouth daily.  . Insulin Glargine (TOUJEO SOLOSTAR) 300 UNIT/ML SOPN Inject 65 Units into the skin daily.  . Insulin Pen Needle (B-D UF III MINI PEN NEEDLES) 31G X 5 MM MISC USE AS DIRECTED.  Elmore Guise Devices (ACCU-CHEK SOFTCLIX) lancets Use as instructed with checking blood sugar three times daily. Dx: E11.51  . Loratadine 10 MG CAPS Take 1 capsule (10 mg total) by mouth daily.  [name] losartan (COZAAR) 50 MG tablet Take 1 tablet (50 mg total)  by mouth daily.  . Memantine HCl-Donepezil HCl (NAMZARIC) 28-10 MG CP24 Take 1 capsule by mouth daily.  . Multiple Vitamins-Minerals (SENTRY SENIOR) TABS Take 1 tablet by mouth daily.  . simvastatin (ZOCOR) 20 MG tablet Take 1 tablet (20 mg total) by mouth daily.  . [DISCONTINUED] albuterol (PROVENTIL HFA;VENTOLIN HFA) 108 (90 Base) MCG/ACT inhaler INHALE TWO PUFFS EVERY 4-6 HOURS AS NEEDED FOR DIFFICULTY BREATHING,WHEEZING OR COUGH  . [DISCONTINUED] amLODipine (NORVASC) 5 MG tablet Take 1 tablet (5 mg total) by mouth daily.  . [DISCONTINUED] escitalopram (LEXAPRO) 5 MG tablet TAKE 1  TABLET EVERY DAY  . [DISCONTINUED] furosemide (LASIX) 20 MG tablet Take 1 tablet (20 mg total) by mouth daily as needed for edema.  . [DISCONTINUED] glucose blood (ACCU-CHEK AVIVA) test strip Use to test blood sugar three times daily. Dx: E11.51  . [DISCONTINUED] hydrochlorothiazide (HYDRODIURIL) 25 MG tablet TAKE 1 TABLET (25 MG TOTAL) BY MOUTH DAILY.  . [DISCONTINUED] Insulin Pen Needle (B-D UF III MINI PEN NEEDLES) 31G X 5 MM MISC USE AS DIRECTED.  . [DISCONTINUED] Loratadine 10 MG CAPS Take 1 capsule by mouth daily.  . [DISCONTINUED] losartan (COZAAR) 50 MG tablet Take 1 tablet (50 mg total) by mouth daily.  . [DISCONTINUED] Memantine HCl-Donepezil HCl (NAMZARIC) 28-10 MG CP24 Take 1 capsule by mouth daily.  . [DISCONTINUED] simvastatin (ZOCOR) 20 MG tablet Take 1 tablet (20 mg total) by mouth daily.  . [DISCONTINUED] doxycycline (VIBRA-TABS) 100 MG tablet Take 1 tablet (100 mg total) by mouth 2 (two) times daily.  . [DISCONTINUED] fluticasone (FLONASE) 50 MCG/ACT nasal spray Place 1 spray into both nostrils daily.  . [DISCONTINUED] predniSONE (STERAPRED UNI-PAK 21 TAB) 10 MG (21) TBPK tablet Use as directed   No facility-administered encounter medications on file as of 08/31/2018.     Review of Systems  Immunization History  Administered Date(s) Administered  . Influenza, High Dose Seasonal PF 06/22/2013, 05/30/2015  . Influenza,inj,Quad PF,6+ Mos 04/19/2014, 04/02/2016, 04/01/2017, 04/26/2018  . Pneumococcal Conjugate-13 07/25/2014  . Pneumococcal Polysaccharide-23 08/03/2011, 06/22/2013  . Td 10/22/2013  . Tdap 07/30/2015  . Zoster Recombinat (Shingrix) 05/20/2017, 07/20/2017   Pertinent  Health Maintenance Due  Topic Date Due  . OPHTHALMOLOGY EXAM  06/14/2018  . FOOT EXAM  10/05/2018  . HEMOGLOBIN A1C  10/25/2018  . INFLUENZA VACCINE  Completed  . PNA vac Low Risk Adult  Completed   Fall Risk  08/31/2018 04/26/2018 03/07/2018 01/20/2018 10/04/2017  Falls in the past year? 1 No No  No No  Number falls in past yr: 0 - - - -  Injury with Fall? 0 - - - -  Comment - - - - -  Risk Factor Category  - - - - -   Functional Status Survey:    Vitals:   08/31/18 0947  BP: (!) 142/70  Pulse: 76  Temp: 98.3 F (36.8 C)  TempSrc: Oral  SpO2: 90%  Weight: 192 lb 9.6 oz (87.4 kg)  Height: '5\' 11"'  (1.803 m)   Body mass index is 26.86 kg/m. Physical Exam  Labs reviewed: Recent Labs    09/30/17 0810 01/16/18 0916 04/26/18 0827  NA 144 143 140  K 4.5 4.4 4.2  CL 102 102 100  CO2 37* 34* 31  GLUCOSE 84 150* 321*  BUN '18 22 19  ' CREATININE 1.41* 1.54* 1.45*  CALCIUM 9.9 9.6 9.6   Recent Labs    09/30/17 0810 01/16/18 0916 04/26/18 0827  AST  --  25 27  ALT 20  19 19  BILITOT  --  0.3 0.3  PROT  --  6.9 6.4   Recent Labs    04/26/18 0827 07/19/18 0922  WBC 5.4 4.9  NEUTROABS 2,808 1,955  HGB 10.5* 11.6*  HCT 32.2* 34.3*  MCV 93.3 92.7  PLT 174 187   Lab Results  Component Value Date   TSH 1.60 01/16/2018   Lab Results  Component Value Date   HGBA1C 8.6 (H) 04/26/2018   Lab Results  Component Value Date   CHOL 167 04/26/2018   HDL 35 (L) 04/26/2018   LDLCALC 97 04/26/2018   LDLDIRECT 92.0 11/24/2015   TRIG 229 (H) 04/26/2018   CHOLHDL 4.8 04/26/2018    Significant Diagnostic Results in last 30 days:  No results found.  Assessment/Plan 1. Unsteady gait  - Ambulatory referral to Physical Therapy  2. Essential hypertension  - amLODipine (NORVASC) 5 MG tablet; Take 1 tablet (5 mg total) by mouth daily.  Dispense: 90 tablet; Refill: 1 - CBC with Differential/Platelet - Lipid Panel - CMP with eGFR([facility])  3. Depression, unspecified depression type  - escitalopram (LEXAPRO) 5 MG tablet; TAKE 1 TABLET EVERY DAY  Dispense: 90 tablet; Refill: 1 - TSH  4. Bilateral lower extremity edema  - furosemide (LASIX) 20 MG tablet; Take 1 tablet (20 mg total) by mouth daily as needed for edema.  Dispense: 30 tablet; Refill: 3  5. Moderate  dementia with behavioral disturbance (HCC)  - Memantine HCl-Donepezil HCl (NAMZARIC) 28-10 MG CP24; Take 1 capsule by mouth daily.  Dispense: 90 capsule; Refill: 1  6. Type 2 diabetes mellitus with stage 3 chronic kidney disease, with long-term current use of insulin (HCC)  - Insulin Pen Needle (B-D UF III MINI PEN NEEDLES) 31G X 5 MM MISC; USE AS DIRECTED.  Dispense: 270 each; Refill: 11 - glucose blood (ACCU-CHEK AVIVA) test strip; Use to test blood sugar three times daily. Dx: E11.51  Dispense: 300 each; Refill: 1 - Lipid Panel - Hemoglobin A1c  7. Fall at home, initial encounter  - Ambulatory referral to Physical Therapy    Family/ staff Communication: Reviewed plan of care with patient and facility Nurse supervisor  Labs/tests ordered: None   Sandrea Hughs, NP

## 2018-08-31 NOTE — Progress Notes (Addendum)
Provider: Marlowe Sax FNP-C   No primary care provider on file.  Patient Care Team: Macarthur Critchley, [place] as Referring Physician (Optometry) Delice Lesch Lezlie Octave, MD as Consulting Physician (Neurology)  Extended Emergency Contact Information Primary Emergency Contact: [facility] Address: [address]          [place], [place] [address] [name] of [place] Phone: [phone] Relation: Daughter Secondary Emergency Contact: [name] [place] of [place] Phone: [phone] Mobile Phone: [phone] Relation: Other  Goals of care: Advanced Directive information Advanced Directives 08/31/2018  Does Patient Have a Medical Advance Directive? No  Type of Advance Directive -  Does patient want to make changes to medical advance directive? -  Copy of [place] in Chart? -  Would patient like information on creating a medical advance directive? No - Patient declined     Chief Complaint  Patient presents with  . Acute Visit    Patient c/o of cold symptoms for about a week. He is taking tussin dm and alkaseltzer cold medicine. Patient seems to feel a little better today.   [name] other    Patient daughter is interested in getting patient a walker    HPI:  Pt is a 85 y.o. male seen today for medical management of chronic diseases.He is here today escorted by daughter who provides additional information.she states patient has had some cold symptoms for about one week.He has used Tussin cough syrup and alkaseltzer with much improvement.He denies any cough,shortness of breath,wheezing or fever.He states stays cold all the time but no chills.  Her daughter states was working at home on Tuesday when she had a "Boom" from patient room.Upon checking on patient, found patient getting off the floor.No injuries sustained.she states patient tends to sleep at the edge of the bed but has had unsteady gait.He uses a cane for walking.  Daughter also request all medication  refilled states patient's insurance has changed.   Hypertension - on losartan 50 mg tablet daily,HZCT 25 mg tablet daily and amlodipine 5 mg tablet daily.denies any headache,dizziness or chest pain.on ASA and Statin.    Type 2 DM - on Toujeo 65 units daily.Needs refill on test strip.daughter states has not been able to check blood sugars but will get back regular check.No signs of hyper/hypoglycemia noted.    Depression - on lexapro 5 mg tablet daily.no changes in mood reported.    Lower extremities edema - No weight gain.swelling stable per daughter elevates legs while seated.on Furosemide 20 mg tablet daily as needed. Daughter applies Vaseline with coco butter.  Dementia with behavioral disturbance - on Namzaric 28-10 mg capsule daily.no new behavorial issues reported.     Past Medical History:  Diagnosis Date  . Arthritis   . CAD (coronary artery disease)    s/p DES to LAD 06/2013  . Depression   . Dyslipidemia   . Glaucoma   . Hyperglycemia 04/17/2014  . Hyperlipidemia   . Hypertension   . Stroke ([place])   . Type 2 diabetes mellitus ([place])    Past Surgical History:  Procedure Laterality Date  . CARDIAC CATHETERIZATION  06/2013   DES to LAD by Dr Tamala Julian  . Right Cataract      Allergies  Allergen Reactions  . Lisinopril Cough    Allergies as of 08/31/2018      Reactions   Lisinopril Cough      Medication List       Accurate as of August 31, 2018 10:54 AM. Always  use your most recent med list.        ACCU-CHEK AVIVA PLUS w/Device Kit Use as directed with checking blood sugar three times daily. Dx: E11.51   ACCU-CHEK AVIVA Soln Use as Directed. Dx: E11.51   accu-chek softclix lancets Use as instructed with checking blood sugar three times daily. Dx: E11.51   albuterol 108 (90 Base) MCG/ACT inhaler Commonly known as:  PROVENTIL HFA;VENTOLIN HFA INHALE TWO PUFFS EVERY 4-6 HOURS AS NEEDED FOR DIFFICULTY BREATHING,WHEEZING OR COUGH   amLODipine 5 MG  tablet Commonly known as:  NORVASC Take 1 tablet (5 mg total) by mouth daily.   aspirin EC 81 MG tablet Take 1 tablet (81 mg total) by mouth daily.   B-D SINGLE USE SWABS REGULAR Pads USE AS DIRECTED WITH CHECKING BLOOD SUGAR THREE TIMES DAILY   escitalopram 5 MG tablet Commonly known as:  LEXAPRO TAKE 1 TABLET EVERY DAY   furosemide 20 MG tablet Commonly known as:  LASIX Take 1 tablet (20 mg total) by mouth daily as needed for edema.   glucose blood test strip Commonly known as:  ACCU-CHEK AVIVA Use to test blood sugar three times daily. Dx: E11.51   hydrochlorothiazide 25 MG tablet Commonly known as:  HYDRODIURIL Take 1 tablet (25 mg total) by mouth daily.   Insulin Pen Needle 31G X 5 MM Misc Commonly known as:  B-D UF III MINI PEN NEEDLES USE AS DIRECTED.   Loratadine 10 MG Caps Take 1 capsule (10 mg total) by mouth daily.   losartan 50 MG tablet Commonly known as:  COZAAR Take 1 tablet (50 mg total) by mouth daily.   Memantine HCl-Donepezil HCl 28-10 MG Cp24 Commonly known as:  NAMZARIC Take 1 capsule by mouth daily.   SENTRY SENIOR Tabs Take 1 tablet by mouth daily.   simvastatin 20 MG tablet Commonly known as:  ZOCOR Take 1 tablet (20 mg total) by mouth daily.   TOUJEO SOLOSTAR 300 UNIT/ML Sopn Generic drug:  Insulin Glargine Inject 65 Units into the skin daily.       Review of Systems  Constitutional: Negative for appetite change, chills, fatigue and fever.       Has had 4.4 lbs weight loss over one month.Daughter states patient's appetite is very good eats everything on his plate.   HENT: Positive for rhinorrhea. Negative for congestion, postnasal drip, sinus pressure, sinus pain, sneezing, sore throat and trouble swallowing.        Has dentures both upper and lower but does not like to wear per daughter   Eyes: Positive for visual disturbance. Negative for pain, discharge, redness and itching.  Respiratory: Negative for cough, chest tightness,  shortness of breath and wheezing.   Cardiovascular: Positive for leg swelling. Negative for chest pain and palpitations.  Gastrointestinal: Negative for abdominal distention, abdominal pain, constipation, diarrhea, nausea and vomiting.  Endocrine: Negative for heat intolerance, polydipsia, polyphagia and polyuria.       Stays cold all the time chronic for patient.   Genitourinary: Negative for dysuria, flank pain, frequency and urgency.  Musculoskeletal: Positive for gait problem. Negative for arthralgias and back pain.  Skin: Negative for color change, pallor, rash and wound.  Neurological: Negative for dizziness, syncope, light-headedness, numbness and headaches.  Hematological: Does not bruise/bleed easily.  Psychiatric/Behavioral: Negative for agitation and sleep disturbance. The patient is not nervous/anxious.     Immunization History  Administered Date(s) Administered  . Influenza, High Dose Seasonal PF 06/22/2013, 05/30/2015  . Influenza,inj,Quad PF,6+ Mos 04/19/2014,  04/02/2016, 04/01/2017, 04/26/2018  . Pneumococcal Conjugate-13 07/25/2014  . Pneumococcal Polysaccharide-23 08/03/2011, 06/22/2013  . Td 10/22/2013  . Tdap 07/30/2015  . Zoster Recombinat (Shingrix) 05/20/2017, 07/20/2017   Pertinent  Health Maintenance Due  Topic Date Due  . OPHTHALMOLOGY EXAM  06/14/2018  . FOOT EXAM  10/05/2018  . HEMOGLOBIN A1C  10/25/2018  . INFLUENZA VACCINE  Completed  . PNA vac Low Risk Adult  Completed   Fall Risk  08/31/2018 04/26/2018 03/07/2018 01/20/2018 10/04/2017  Falls in the past year? 1 No No No No  Number falls in past yr: 0 - - - -  Injury with Fall? 0 - - - -  Comment - - - - -  Risk Factor Category  - - - - -    Vitals:   08/31/18 0947  BP: (!) 142/70  Pulse: 76  Temp: 98.3 F (36.8 C)  TempSrc: Oral  SpO2: 90%  Weight: 192 lb 9.6 oz (87.4 kg)  Height: '5\' 11"'  (1.803 m)   Body mass index is 26.86 kg/m. Physical Exam Constitutional:      General: He is not in  acute distress.    Appearance: He is normal weight.  HENT:     Head: Normocephalic.     Right Ear: Tympanic membrane, ear canal and external ear normal. There is no impacted cerumen.     Left Ear: Tympanic membrane, ear canal and external ear normal. There is no impacted cerumen.     Nose: Rhinorrhea present. No congestion.     Mouth/Throat:     Mouth: Mucous membranes are moist.     Pharynx: Oropharynx is clear. No oropharyngeal exudate or posterior oropharyngeal erythema.     Comments: Missing all teeth. Eyes:     General: No scleral icterus.       Right eye: No discharge.        Left eye: No discharge.     Conjunctiva/sclera: Conjunctivae normal.     Pupils: Pupils are equal, round, and reactive to light.  Neck:     Musculoskeletal: Normal range of motion. No muscular tenderness.     Vascular: No carotid bruit.  Cardiovascular:     Rate and Rhythm: Normal rate and regular rhythm.     Pulses: Normal pulses.     Heart sounds: Murmur present. No friction rub. No gallop.   Pulmonary:     Effort: Pulmonary effort is normal. No respiratory distress.     Breath sounds: Normal breath sounds. No wheezing, rhonchi or rales.  Chest:     Chest wall: No tenderness.  Abdominal:     General: Bowel sounds are normal. There is no distension.     Palpations: Abdomen is soft. There is no mass.     Tenderness: There is no abdominal tenderness. There is no right CVA tenderness, left CVA tenderness, guarding or rebound.     Hernia: No hernia is present.  Musculoskeletal: Normal range of motion.        General: No tenderness.     Comments: Unsteady gait ambulates with right hand cane.bilateral lower extremities chronic edema with venous stasis skin changes.   Lymphadenopathy:     Cervical: No cervical adenopathy.  Skin:    General: Skin is dry.     Coloration: Skin is not pale.     Findings: No erythema, lesion or rash.  Neurological:     Mental Status: He is alert. Mental status is at  baseline.     Sensory: No sensory deficit.  Gait: Gait abnormal.  Psychiatric:        Mood and Affect: Mood normal.        Speech: Speech is slurred.        Behavior: Behavior is cooperative.        Thought Content: Thought content normal.        Cognition and Memory: Memory is impaired.     Labs reviewed: Recent Labs    09/30/17 0810 01/16/18 0916 04/26/18 0827  NA 144 143 140  K 4.5 4.4 4.2  CL 102 102 100  CO2 37* 34* 31  GLUCOSE 84 150* 321*  BUN '18 22 19  ' CREATININE 1.41* 1.54* 1.45*  CALCIUM 9.9 9.6 9.6   Recent Labs    09/30/17 0810 01/16/18 0916 04/26/18 0827  AST  --  25 27  ALT '20 19 19  ' BILITOT  --  0.3 0.3  PROT  --  6.9 6.4   Recent Labs    04/26/18 0827 07/19/18 0922  WBC 5.4 4.9  NEUTROABS 2,808 1,955  HGB 10.5* 11.6*  HCT 32.2* 34.3*  MCV 93.3 92.7  PLT 174 187   Lab Results  Component Value Date   TSH 1.60 01/16/2018   Lab Results  Component Value Date   HGBA1C 8.6 (H) 04/26/2018   Lab Results  Component Value Date   CHOL 167 04/26/2018   HDL 35 (L) 04/26/2018   LDLCALC 97 04/26/2018   LDLDIRECT 92.0 11/24/2015   TRIG 229 (H) 04/26/2018   CHOLHDL 4.8 04/26/2018    Significant Diagnostic Results in last 30 days:  No results found.  Assessment/Plan 1. Unsteady gait Ambulates with right hand cane.Fall and safety precaution advised but limited due to cognitive impairment.  - Ambulatory referral to Physical Therapy  2. Essential hypertension B/p stable.Continue on losartan 50 mg tablet daily,HZCT 25 mg tablet daily,Furosemide 20 mg tablet daily as needed and amlodipine 5 mg tablet daily.on ASA for chest pain prophylaxis. - amLODipine (NORVASC) 5 MG tablet; Take 1 tablet (5 mg total) by mouth daily.  Dispense: 90 tablet; Refill: 1 - CBC with Differential/Platelet - Lipid Panel - CMP with eGFR([facility])  3. Depression, unspecified depression type Mood stable.continue on Lexapro 5 mg tablet daily.  - escitalopram (LEXAPRO) 5  MG tablet; TAKE 1 TABLET EVERY DAY  Dispense: 90 tablet; Refill: 1 - TSH  4. Bilateral lower extremity edema Stable.No weight gain,cough or shortness of breath.continue on Furosemide 20 mg tablet daily as needed and HZCT 25 mg tablet daily.continue to elevate legs when seated.Skin care. - furosemide (LASIX) 20 MG tablet; Take 1 tablet (20 mg total) by mouth daily as needed for edema.  Dispense: 30 tablet; Refill: 3  5. Moderate dementia with behavioral disturbance ([place]) No new behavioral issues.4.4 lbs weight loss noted.Has good appetite per daughter.Progressive decline expected.continue to monitor. - Memantine HCl-Donepezil HCl (NAMZARIC) 28-10 MG CP24; Take 1 capsule by mouth daily.  Dispense: 90 capsule; Refill: 1  6. Type 2 diabetes mellitus with stage 3 chronic kidney disease, with long-term current use of insulin (H[facility]) No CBG log.encouraged to check blood sugars and record then bring record on visit.Continue on  on Toujeo 65 units daily.On Losartan,ASA and Statin.Refer to Ophthalmology next visit for annual eye exam and podiatrist for foot exam.  - Insulin Pen Needle (B-D UF III MINI PEN NEEDLES) 31G X 5 MM MISC; USE AS DIRECTED.  Dispense: 270 each; Refill: 11 - glucose blood (ACCU-CHEK AVIVA) test strip; Use to test blood sugar three times  daily. Dx: E11.51  Dispense: 300 each; Refill: 1 - Lipid Panel - Hemoglobin A1c  7. Fall at home, initial encounter No injuries sustained. - Ambulatory referral to Physical Therapy  8. Symptoms of upper Respiratory Infection Afebrile.Symptoms have improved with OTC medication.Rhinnorrhea noted.lungs CTA.continue on Loratadine 10 mg tablet daily.   9. Hyperlipidemia   Continue on simvastatin 20 mg tablet daily. - Lipid panel   Family/ staff Communication: Reviewed plan of care with patient and daughter.   Labs/tests ordered: CBC/diff,CMP,TSH level,A1C and lipid panel    Nelda Bucks Ngetich, NP

## 2018-08-31 NOTE — Patient Instructions (Addendum)
1.Physical Therapy referral placed for gait and walker evaluation  2. Please check blood sugars once daily and record.bring your blood sugar log on next visit.   Fall Prevention in the Home, Adult Falls can cause injuries. They can happen to people of all ages. There are many things you can do to make your home safe and to help prevent falls. Ask for help when making these changes, if needed. What actions can I take to prevent falls? General Instructions  Use good lighting in all rooms. Replace any light bulbs that burn out.  Turn on the lights when you go into a dark area. Use night-lights.  Keep items that you use often in easy-to-reach places. Lower the shelves around your home if necessary.  Set up your furniture so you have a clear path. Avoid moving your furniture around.  Do not have throw rugs and other things on the floor that can make you trip.  Avoid walking on wet floors.  If any of your floors are uneven, fix them.  Add color or contrast paint or tape to clearly mark and help you see: ? Any grab bars or handrails. ? First and last steps of stairways. ? Where the edge of each step is.  If you use a stepladder: ? Make sure that it is fully opened. Do not climb a closed stepladder. ? Make sure that both sides of the stepladder are locked into place. ? Ask someone to hold the stepladder for you while you use it.  If there are any pets around you, be aware of where they are. What can I do in the bathroom?      Keep the floor dry. Clean up any water that spills onto the floor as soon as it happens.  Remove soap buildup in the tub or shower regularly.  Use non-skid mats or decals on the floor of the tub or shower.  Attach bath mats securely with double-sided, non-slip rug tape.  If you need to sit down in the shower, use a plastic, non-slip stool.  Install grab bars by the toilet and in the tub and shower. Do not use towel bars as grab bars. What can I do in the  bedroom?  Make sure that you have a light by your bed that is easy to reach.  Do not use any sheets or blankets that are too big for your bed. They should not hang down onto the floor.  Have a firm chair that has side arms. You can use this for support while you get dressed. What can I do in the kitchen?  Clean up any spills right away.  If you need to reach something above you, use a strong step stool that has a grab bar.  Keep electrical cords out of the way.  Do not use floor polish or wax that makes floors slippery. If you must use wax, use non-skid floor wax. What can I do with my stairs?  Do not leave any items on the stairs.  Make sure that you have a light switch at the top of the stairs and the bottom of the stairs. If you do not have them, ask someone to add them for you.  Make sure that there are handrails on both sides of the stairs, and use them. Fix handrails that are broken or loose. Make sure that handrails are as long as the stairways.  Install non-slip stair treads on all stairs in your home.  Avoid having throw rugs  at the top or bottom of the stairs. If you do have throw rugs, attach them to the floor with carpet tape.  Choose a carpet that does not hide the edge of the steps on the stairway.  Check any carpeting to make sure that it is firmly attached to the stairs. Fix any carpet that is loose or worn. What can I do on the outside of my home?  Use bright outdoor lighting.  Regularly fix the edges of walkways and driveways and fix any cracks.  Remove anything that might make you trip as you walk through a door, such as a raised step or threshold.  Trim any bushes or trees on the path to your home.  Regularly check to see if handrails are loose or broken. Make sure that both sides of any steps have handrails.  Install guardrails along the edges of any raised decks and porches.  Clear walking paths of anything that might make someone trip, such as tools  or rocks.  Have any leaves, snow, or ice cleared regularly.  Use sand or salt on walking paths during winter.  Clean up any spills in your garage right away. This includes grease or oil spills. What other actions can I take?  Wear shoes that: ? Have a low heel. Do not wear high heels. ? Have rubber bottoms. ? Are comfortable and fit you well. ? Are closed at the toe. Do not wear open-toe sandals.  Use tools that help you move around (mobility aids) if they are needed. These include: ? Canes. ? Walkers. ? Scooters. ? Crutches.  Review your medicines with your doctor. Some medicines can make you feel dizzy. This can increase your chance of falling. Ask your doctor what other things you can do to help prevent falls. Where to find more information  Centers for Disease Control and Prevention, STEADI: https://garcia.biz/  [employer] on Aging: BrainJudge.co.uk Contact a doctor if:  You are afraid of falling at home.  You feel weak, drowsy, or dizzy at home.  You fall at home. Summary  There are many simple things that you can do to make your home safe and to help prevent falls.  Ways to make your home safe include removing tripping hazards and installing grab bars in the bathroom.  Ask for help when making these changes in your home. This information is not intended to replace advice given to you by your health care provider. Make sure you discuss any questions you have with your health care provider. Document Released: 05/15/2009 Document Revised: 03/03/2017 Document Reviewed: 03/03/2017 Elsevier Interactive Patient Education  2019 [employer].

## 2018-09-01 ENCOUNTER — Other Ambulatory Visit: Payer: Self-pay

## 2018-09-01 DIAGNOSIS — E1151 Type 2 diabetes mellitus with diabetic peripheral angiopathy without gangrene: Secondary | ICD-10-CM

## 2018-09-01 DIAGNOSIS — I10 Essential (primary) hypertension: Secondary | ICD-10-CM

## 2018-09-01 DIAGNOSIS — E782 Mixed hyperlipidemia: Secondary | ICD-10-CM

## 2018-09-01 DIAGNOSIS — I25119 Atherosclerotic heart disease of native coronary artery with unspecified angina pectoris: Secondary | ICD-10-CM

## 2018-09-01 DIAGNOSIS — Z794 Long term (current) use of insulin: Secondary | ICD-10-CM

## 2018-09-01 DIAGNOSIS — I119 Hypertensive heart disease without heart failure: Secondary | ICD-10-CM

## 2018-09-01 DIAGNOSIS — E1122 Type 2 diabetes mellitus with diabetic chronic kidney disease: Secondary | ICD-10-CM

## 2018-09-01 DIAGNOSIS — N183 Chronic kidney disease, stage 3 (moderate): Secondary | ICD-10-CM

## 2018-09-01 LAB — CBC WITH DIFFERENTIAL/PLATELET
Absolute Monocytes: 635 cells/uL (ref 200–950)
Basophils Absolute: 28 cells/uL (ref 0–200)
Basophils Relative: 0.6 %
Eosinophils Absolute: 150 cells/uL (ref 15–500)
Eosinophils Relative: 3.2 %
HEMATOCRIT: 35.2 % — AB (ref 38.5–50.0)
Hemoglobin: 11.8 g/dL — ABNORMAL LOW (ref 13.2–17.1)
Lymphs Abs: 1640 cells/uL (ref 850–3900)
MCH: 31.1 pg (ref 27.0–33.0)
MCHC: 33.5 g/dL (ref 32.0–36.0)
MCV: 92.9 fL (ref 80.0–100.0)
MPV: 10.3 fL (ref 7.5–12.5)
Monocytes Relative: 13.5 %
Neutro Abs: 2247 cells/uL (ref 1500–7800)
Neutrophils Relative %: 47.8 %
Platelets: 180 10*3/uL (ref 140–400)
RBC: 3.79 10*6/uL — ABNORMAL LOW (ref 4.20–5.80)
RDW: 12.6 % (ref 11.0–15.0)
Total Lymphocyte: 34.9 %
WBC: 4.7 10*3/uL (ref 3.8–10.8)

## 2018-09-01 LAB — COMPLETE METABOLIC PANEL WITH GFR
AG Ratio: 1.8 (calc) (ref 1.0–2.5)
ALT: 22 U/L (ref 9–46)
AST: 31 U/L (ref 10–35)
Albumin: 4.3 g/dL (ref 3.6–5.1)
Alkaline phosphatase (APISO): 58 U/L (ref 40–115)
BUN / CREAT RATIO: 11 (calc) (ref 6–22)
BUN: 16 mg/dL (ref 7–25)
CO2: 35 mmol/L — ABNORMAL HIGH (ref 20–32)
Calcium: 9.8 mg/dL (ref 8.6–10.3)
Chloride: 103 mmol/L (ref 98–110)
Creat: 1.4 mg/dL — ABNORMAL HIGH (ref 0.70–1.11)
GFR, Est African American: 49 mL/min/{1.73_m2} — ABNORMAL LOW (ref >=60)
GFR, Est Non African American: 43 mL/min/{1.73_m2} — ABNORMAL LOW (ref >=60)
Globulin: 2.4 g/dL (calc) (ref 1.9–3.7)
Glucose, Bld: 82 mg/dL (ref 65–99)
Potassium: 3.9 mmol/L (ref 3.5–5.3)
Sodium: 145 mmol/L (ref 135–146)
Total Bilirubin: 0.5 mg/dL (ref 0.2–1.2)
Total Protein: 6.7 g/dL (ref 6.1–8.1)

## 2018-09-01 LAB — HEMOGLOBIN A1C
Hgb A1c MFr Bld: 7.3 % of total Hgb — ABNORMAL HIGH (ref <5.7)
Mean Plasma Glucose: 163 (calc)
eAG (mmol/L): 9 (calc)

## 2018-09-01 LAB — LIPID PANEL
Cholesterol: 127 mg/dL (ref <200)
HDL: 32 mg/dL — ABNORMAL LOW (ref >40)
LDL Cholesterol (Calc): 74 mg/dL (calc)
Non-HDL Cholesterol (Calc): 95 mg/dL (calc) (ref <130)
Total CHOL/HDL Ratio: 4 (calc) (ref <5.0)
Triglycerides: 120 mg/dL (ref <150)

## 2018-09-01 LAB — TSH: TSH: 1.46 mIU/L (ref 0.40–4.50)

## 2018-09-18 ENCOUNTER — Ambulatory Visit: Payer: Self-pay | Admitting: Podiatry

## 2018-09-19 ENCOUNTER — Encounter: Payer: Self-pay | Admitting: Family

## 2018-09-19 ENCOUNTER — Ambulatory Visit: Payer: Medicare HMO | Attending: Family | Admitting: Physical Therapy

## 2018-09-19 DIAGNOSIS — R29818 Other symptoms and signs involving the nervous system: Secondary | ICD-10-CM | POA: Insufficient documentation

## 2018-09-19 DIAGNOSIS — R2689 Other abnormalities of gait and mobility: Secondary | ICD-10-CM | POA: Insufficient documentation

## 2018-09-19 DIAGNOSIS — R2681 Unsteadiness on feet: Secondary | ICD-10-CM | POA: Insufficient documentation

## 2018-09-20 ENCOUNTER — Ambulatory Visit: Payer: Medicare HMO | Admitting: Podiatry

## 2018-09-27 ENCOUNTER — Ambulatory Visit: Payer: Medicare HMO | Admitting: Physical Therapy

## 2018-09-29 ENCOUNTER — Other Ambulatory Visit: Payer: Self-pay

## 2018-09-29 ENCOUNTER — Ambulatory Visit: Payer: Medicare HMO | Admitting: Rehabilitative and Restorative Service Providers"

## 2018-09-29 ENCOUNTER — Encounter: Payer: Self-pay | Admitting: Rehabilitative and Restorative Service Providers"

## 2018-09-29 ENCOUNTER — Telehealth: Payer: Self-pay | Admitting: Rehabilitative and Restorative Service Providers"

## 2018-09-29 DIAGNOSIS — R29818 Other symptoms and signs involving the nervous system: Secondary | ICD-10-CM | POA: Diagnosis not present

## 2018-09-29 DIAGNOSIS — R2689 Other abnormalities of gait and mobility: Secondary | ICD-10-CM | POA: Diagnosis not present

## 2018-09-29 DIAGNOSIS — R2681 Unsteadiness on feet: Secondary | ICD-10-CM | POA: Diagnosis not present

## 2018-09-29 NOTE — Therapy (Signed)
[facility] [address] [place], [place], [address] Phone: [phone]   Fax:  [phone]  Physical Therapy Evaluation  Patient Details  Name: [name] MRN: [id number] Date of Birth: Apr 10, 1922 Referring Provider (PT): Marlowe Sax, NP   Encounter Date: 09/29/2018  PT End of Session - 09/29/18 1510    Visit Number  1    Number of Visits  4    Date for PT Re-Evaluation  10/29/18    Authorization Type  [employer] medicare- patient summary form submitted    PT Start Time  1410    PT Stop Time  1450    PT Time Calculation (min)  40 min    Equipment Utilized During Treatment  Gait belt    Activity Tolerance  Other (comment)   limited by cognitive deficits/dementia   Behavior During Therapy  --   patient follows commands intermittently/ his daughter frequently has to provide the command      Past Medical History:  Diagnosis Date  . Arthritis   . CAD (coronary artery disease)    s/p DES to LAD 06/2013  . Depression   . Dyslipidemia   . Glaucoma   . Hyperglycemia 04/17/2014  . Hyperlipidemia   . Hypertension   . Stroke ([place])   . Type 2 diabetes mellitus ([place])     Past Surgical History:  Procedure Laterality Date  . CARDIAC CATHETERIZATION  06/2013   DES to LAD by Dr Tamala Julian  . Right Cataract      There were no vitals filed for this visit.   Subjective Assessment - 09/29/18 1413    Subjective  The patient's daughter provides history.  Patient fell a few years ago and used to walk to the barber shop to get his hair cut.  He has has multiple serious falls over the past 2 years hitting his head on a train track and on a brick.  His daughter reports he has fallen multiple times in the past week while in his room.  His daughter reports he is able to get up on his own by the time she gets into the bedroom.      Pertinent History  dementia with behavioral disturbance, HTN, diabetes, depression. stroke, glaucoma    Patient Stated Goals  Reduce falls.      Currently in Pain?  No/denies   daughter reports patient is not able to vocalize pain when she asks/ he used to c/o back pain, but now says "no" if asked        [facility] PT Assessment - 09/29/18 1420      Assessment   Medical Diagnosis  unsteady gait, falls    Referring Provider (PT)  Marlowe Sax, NP    Onset Date/Surgical Date  --   worsening falls x 2 years   Prior Therapy  none      Precautions   Precautions  Fall;Other (comment)   behavioral disturbances per chart     Restrictions   Weight Bearing Restrictions  No      Balance Screen   Has the patient fallen in the past 6 months  Yes    How many times?  multiple- 3 this week; unsure of #    Has the patient had a decrease in activity level because of a fear of falling?   Yes   daughter is limiting him due to falls   Is the patient reluctant to leave their home because of a fear of falling?  Yes      [place] residence    Living Arrangements  Children    Type of [place] to enter    Entrance Stairs-Number of Steps  [place]  One level    [place] - single point;Shower seat      Prior Function   Level of Independence  Independent with household mobility with device      Cognition   Overall Cognitive Status  History of cognitive impairments - at baseline      Ambulation/Gait   Ambulation/Gait  Yes    Ambulation/Gait Assistance  5: Supervision    Ambulation/Gait Assistance Details  Patient walks with [facility] with supervision and with RW with supervision.  He required some guidance with RW to stay within frame (not step to side of RW) and to avoid obstacles when turning.    Ambulation Distance (Feet)  230 Feet    Assistive device  Straight cane;Rolling walker    Gait Pattern  Decreased stride length;Decreased trunk rotation;Trunk flexed    Ambulation Surface  Level;Indoor    Gait velocity  1.95  ft/sec    Stairs  Yes    Stairs Assistance  6: Modified independent (Device/Increase time)    Stair Management Technique  One rail Right;Step to pattern;With cane    Number of Stairs  4    Gait Comments  Trialed RW and patient needed some guidance on steering and staying within the walker.        Standardized Balance Assessment   Standardized Balance Assessment  Berg Balance Test      Berg Balance Test   Sit to Stand  Able to stand  independently using hands    Standing Unsupported  Able to stand safely 2 minutes    Sitting with Back Unsupported but Feet Supported on Floor or Stool  Able to sit safely and securely 2 minutes    Stand to Sit  Controls descent by using hands    Transfers  Able to transfer safely, definite need of hands    Merrilee Jansky comment:  Verlee Rossetti, however patient was unable to participate due to difficulty following instructions or demonstration.                Objective measurements completed on examination: See above findings.      [facility] Adult PT Treatment/Exercise - 09/29/18 1420      Self-Care   Self-Care  Other Self-Care Comments    Other Self-Care Comments   PT discussed that we can order a RW, however the patient may need supervision when initially using due to safety awareness and it being a new task.  Daughter verbalizes understanding.             PT Education - 09/29/18 1509    Education Details  Discussed goals of therapy with daughter:  to ensure safety with RW (will request order) and to teach HEP for caregiver ([place] fall prevention program).    Person(s) Educated  Patient;Caregiver(s)    Methods  Explanation    Comprehension  Verbalized understanding          PT Long Term Goals - 09/29/18 1516      PT LONG TERM GOAL #1   Title  The patient will demonstrate walking with RW mod indep in clinic maintaining good positioning within device.  TARGET DATE 10/29/2018    Time  4    Period  Weeks    Target Date  10/29/18      PT  LONG TERM GOAL #2   Title  The patient's daughter will verbalize understanding of home program [facility]) if patient able to perform.  If not, we will focus on home walking with supervision as exercise to maintain mobility.    Time  4    Period  Weeks    Target Date  10/29/18      PT LONG TERM GOAL #3   Title  The patient will move floor<>stand in the clinic (if he will participate) in order to instruct his daughter in best way to perform floor transfers.    Time  4    Period  Weeks    Target Date  10/29/18             Plan - 09/29/18 1442    Clinical Impression Statement  The patient is a 85 year old male with dementia and difficulty following instructions presenting to OP physical therapy with his daughter, who is his primary caregiver.  Evaluation was limited by dec'd ability to follow commands.  Patient presents with frequent falls and his daughter feels a RW may be beneficial in the home environment.   He has had increasing falls, mostly during ADL tasks, and is able to get himself up from the floor (per report he is usually up when she walks into the room).  PT will focus on teaching a home program or home walking program for supervisoed mobility training, and getting RW and practicing to use for repetition to facilitate carryover of task.      History and Personal Factors relevant to plan of care:  dementia with behavioral disturbance, HTN, diabetes, depression. stroke, glaucoma    Clinical Presentation  Unstable    Clinical Presentation due to:  due to dementia, frequent falls, increasing falls    Clinical Decision Making  Low   due to # of aspects of care PT addressing   Rehab Potential  Good    PT Frequency  1x / week    PT Duration  4 weeks    PT Treatment/Interventions  ADLs/Self Care Home Management;Therapeutic activities;Therapeutic exercise;Balance training;Neuromuscular re-education;Gait training;Stair training;Functional mobility training;Patient/family education    PT  Next Visit Plan  Instruct in [place] and home walking program, check on RW and ensure safety in the home (set up closer obstacles)    Recommended Other Services  collect day program resources for daughter and home program for [place]; [place] order from MD    Consulted and Agree with Plan of Care  Patient;Family member/caregiver    Family Member Consulted  daughter/ main caregiver       Patient will benefit from skilled therapeutic intervention in order to improve the following deficits and impairments:  Abnormal gait, Decreased balance, Decreased safety awareness, Decreased activity tolerance  Visit Diagnosis: Other abnormalities of gait and mobility  Unsteadiness on feet  Other symptoms and signs involving the nervous system     Problem List Patient Active Problem List   Diagnosis Date Noted  . Bilateral lower extremity edema 07/05/2017  . Pulmonary nodules 04/13/2017  . Centrilobular emphysema ([place]) 04/13/2017  . Calculus of gallbladder without cholecystitis without obstruction 04/13/2017  . Chronic cough 04/13/2017  . Adenoma of left adrenal gland 04/13/2017  . Frequent falls 04/01/2017  . Type 2 diabetes mellitus with stage 3 chronic kidney disease, with long-term current use of insulin ([place]) 04/01/2017  .  History of tobacco abuse 04/01/2017  . Unsteady gait 04/01/2017  . Dysarthria 04/01/2017  . History of stroke   . Hyperlipidemia   . Moderate dementia with behavioral disturbance ([place]) 09/19/2015  . Depression 09/19/2015  . Hypertensive heart disease 09/10/2015  . Type 2 diabetes, controlled, with peripheral circulatory disorder ([place])   . Essential hypertension   . Coronary artery disease involving native coronary artery of native heart with angina pectoris ([place])   . Arthritis   . Glaucoma     [place], PT 09/29/2018, 3:25 PM  [facility] [address] [place], [place], [address] Phone: [phone]    Fax:  [phone]  Name: Gurvir Schrom MRN: [id number] Date of Birth: 1921/11/16

## 2018-09-29 NOTE — Telephone Encounter (Signed)
[name], PT evaluated [name] on 09/29/2018.  He would benefit from a RW to use initially with supervision by daughter in the home.   If you agree, please submit an order in epic for [employer] or fax to [phone].   Thank you, Rudell Cobb, MPT

## 2018-10-12 ENCOUNTER — Telehealth: Payer: Self-pay | Admitting: *Deleted

## 2018-10-12 DIAGNOSIS — N183 Chronic kidney disease, stage 3 unspecified: Secondary | ICD-10-CM

## 2018-10-12 DIAGNOSIS — F0391 Unspecified dementia with behavioral disturbance: Secondary | ICD-10-CM

## 2018-10-12 DIAGNOSIS — E1122 Type 2 diabetes mellitus with diabetic chronic kidney disease: Secondary | ICD-10-CM

## 2018-10-12 DIAGNOSIS — Z794 Long term (current) use of insulin: Secondary | ICD-10-CM

## 2018-10-12 DIAGNOSIS — F03B18 Unspecified dementia, moderate, with other behavioral disturbance: Secondary | ICD-10-CM

## 2018-10-12 DIAGNOSIS — R2681 Unsteadiness on feet: Secondary | ICD-10-CM

## 2018-10-12 NOTE — Telephone Encounter (Signed)
[name],daughter called and stated the Physical Therapy that patient was referred to is charging him $40 per visit and they cannot afford this. Stated she called [employer] and gave her a name of [facility] [phone] that will only charge $10 per visit.They prefer a referral to be placed for them instead. Please Advise.

## 2018-10-13 NOTE — Telephone Encounter (Signed)
Refer to Physical Therapy of patient's choice.

## 2018-10-16 ENCOUNTER — Ambulatory Visit: Payer: Medicare HMO | Admitting: Rehabilitative and Restorative Service Providers"

## 2018-10-16 NOTE — Telephone Encounter (Signed)
Referral placed.

## 2018-10-19 DIAGNOSIS — R2681 Unsteadiness on feet: Secondary | ICD-10-CM | POA: Diagnosis not present

## 2018-10-19 DIAGNOSIS — R531 Weakness: Secondary | ICD-10-CM

## 2018-10-19 DIAGNOSIS — Z794 Long term (current) use of insulin: Secondary | ICD-10-CM

## 2018-10-19 DIAGNOSIS — I129 Hypertensive chronic kidney disease with stage 1 through stage 4 chronic kidney disease, or unspecified chronic kidney disease: Secondary | ICD-10-CM | POA: Diagnosis not present

## 2018-10-19 DIAGNOSIS — E1122 Type 2 diabetes mellitus with diabetic chronic kidney disease: Secondary | ICD-10-CM | POA: Diagnosis not present

## 2018-10-19 DIAGNOSIS — N183 Chronic kidney disease, stage 3 (moderate): Secondary | ICD-10-CM

## 2018-10-19 DIAGNOSIS — F0390 Unspecified dementia without behavioral disturbance: Secondary | ICD-10-CM | POA: Diagnosis not present

## 2018-10-23 ENCOUNTER — Encounter: Payer: Medicare HMO | Admitting: Rehabilitative and Restorative Service Providers"

## 2018-10-23 DIAGNOSIS — I129 Hypertensive chronic kidney disease with stage 1 through stage 4 chronic kidney disease, or unspecified chronic kidney disease: Secondary | ICD-10-CM | POA: Diagnosis not present

## 2018-10-23 DIAGNOSIS — R531 Weakness: Secondary | ICD-10-CM | POA: Diagnosis not present

## 2018-10-23 DIAGNOSIS — F0390 Unspecified dementia without behavioral disturbance: Secondary | ICD-10-CM | POA: Diagnosis not present

## 2018-10-23 DIAGNOSIS — R2681 Unsteadiness on feet: Secondary | ICD-10-CM | POA: Diagnosis not present

## 2018-10-23 DIAGNOSIS — N183 Chronic kidney disease, stage 3 (moderate): Secondary | ICD-10-CM | POA: Diagnosis not present

## 2018-10-23 DIAGNOSIS — Z794 Long term (current) use of insulin: Secondary | ICD-10-CM | POA: Diagnosis not present

## 2018-10-23 DIAGNOSIS — E1122 Type 2 diabetes mellitus with diabetic chronic kidney disease: Secondary | ICD-10-CM | POA: Diagnosis not present

## 2018-10-24 DIAGNOSIS — F0390 Unspecified dementia without behavioral disturbance: Secondary | ICD-10-CM | POA: Diagnosis not present

## 2018-10-24 DIAGNOSIS — E1122 Type 2 diabetes mellitus with diabetic chronic kidney disease: Secondary | ICD-10-CM | POA: Diagnosis not present

## 2018-10-24 DIAGNOSIS — I129 Hypertensive chronic kidney disease with stage 1 through stage 4 chronic kidney disease, or unspecified chronic kidney disease: Secondary | ICD-10-CM | POA: Diagnosis not present

## 2018-10-24 DIAGNOSIS — N183 Chronic kidney disease, stage 3 (moderate): Secondary | ICD-10-CM | POA: Diagnosis not present

## 2018-10-24 DIAGNOSIS — R2681 Unsteadiness on feet: Secondary | ICD-10-CM | POA: Diagnosis not present

## 2018-10-24 DIAGNOSIS — R531 Weakness: Secondary | ICD-10-CM | POA: Diagnosis not present

## 2018-10-24 DIAGNOSIS — Z794 Long term (current) use of insulin: Secondary | ICD-10-CM | POA: Diagnosis not present

## 2018-10-26 ENCOUNTER — Ambulatory Visit: Payer: Medicare HMO | Admitting: Podiatry

## 2018-11-06 ENCOUNTER — Telehealth: Payer: Self-pay | Admitting: *Deleted

## 2018-11-06 ENCOUNTER — Telehealth: Payer: Self-pay | Admitting: Family

## 2018-11-06 DIAGNOSIS — Z9181 History of falling: Secondary | ICD-10-CM

## 2018-11-06 DIAGNOSIS — R2681 Unsteadiness on feet: Secondary | ICD-10-CM

## 2018-11-06 MED ORDER — INSULIN GLARGINE (1 UNIT DIAL) 300 UNIT/ML ~~LOC~~ SOPN: 65.0000 [IU] | PEN_INJECTOR | Freq: Every day | SUBCUTANEOUS | 3 refills | Status: DC

## 2018-11-06 NOTE — Telephone Encounter (Signed)
Patient daughter, [name] called and stated that they have released [facility] from coming to their home because they have had several incidents where the Nurse has sneezed or has not sanitized and was asked to. Daughter is trying to keep her 85 year old day safe. She has a couple concerns: 1. What is the lotion you suggested OTC for them to put on his legs?  2. Wants to know how they can get a walker for her dad?   3.Wants Toujeo Rx faxed to [facility]. (Already Faxed.)  Please Advise.

## 2018-11-06 NOTE — Telephone Encounter (Signed)
1. May apply Aquaphor ointment twice daily. 2. Patient will need physical Therapy to evaluate for walker 3. Refill Toujeo

## 2018-11-06 NOTE — Telephone Encounter (Signed)
Daughter Notified and agreed. Stated that they will wait on the referral for another Home Health referral for walker evaluation until after COVID-19 crisis.

## 2018-11-06 NOTE — Telephone Encounter (Signed)
Patient daughter requested to go to [facility].

## 2018-11-06 NOTE — Telephone Encounter (Signed)
Need urgent refill for mail order to [facility] for  Toujeo insulin.  Thanks, [name]

## 2018-11-07 NOTE — Telephone Encounter (Signed)
Send order for Rolling walker to Home health.

## 2018-11-07 NOTE — Telephone Encounter (Signed)
Christy with [facility] stated that patient has been evaluated for the rolling walker and it is ok to send DME Rx/Order to [facility] or [facility]. Please Advise.

## 2018-11-08 NOTE — Telephone Encounter (Signed)
Order placed and printed. Faxed to [facility].

## 2018-11-08 NOTE — Addendum Note (Signed)
Addended by: Rafael Bihari A on: 11/08/2018 09:02 AM   Modules accepted: Orders

## 2018-11-08 NOTE — Telephone Encounter (Signed)
Placed in Dinah's box for signature.

## 2018-11-08 NOTE — Addendum Note (Signed)
Addended by: Rafael Bihari A on: 11/08/2018 08:38 AM   Modules accepted: Orders

## 2018-11-27 DIAGNOSIS — R600 Localized edema: Secondary | ICD-10-CM | POA: Diagnosis not present

## 2018-11-29 ENCOUNTER — Telehealth: Payer: Self-pay | Admitting: Family

## 2018-11-29 ENCOUNTER — Other Ambulatory Visit: Payer: Self-pay

## 2018-11-29 ENCOUNTER — Ambulatory Visit: Payer: Medicare HMO | Admitting: Podiatry

## 2018-11-29 NOTE — Telephone Encounter (Signed)
Needs referral to [facility] [address] for yearly foot care & maintenance.  Thanks, Lattie Haw

## 2018-11-30 ENCOUNTER — Ambulatory Visit: Payer: Commercial Managed Care - HMO | Admitting: Family

## 2018-11-30 ENCOUNTER — Other Ambulatory Visit: Payer: Medicare HMO

## 2018-11-30 NOTE — Telephone Encounter (Signed)
May refer to [facility] for type 2 DM annual foot exam.

## 2018-12-01 ENCOUNTER — Ambulatory Visit (INDEPENDENT_AMBULATORY_CARE_PROVIDER_SITE_OTHER): Payer: Medicare HMO | Admitting: Family

## 2018-12-01 ENCOUNTER — Ambulatory Visit: Payer: Medicare HMO | Admitting: Podiatry

## 2018-12-01 ENCOUNTER — Other Ambulatory Visit: Payer: Self-pay

## 2018-12-01 ENCOUNTER — Other Ambulatory Visit: Payer: Medicare HMO

## 2018-12-01 ENCOUNTER — Encounter: Payer: Self-pay | Admitting: Family

## 2018-12-01 VITALS — BP 110/74 | HR 71 | Temp 96.9°F

## 2018-12-01 DIAGNOSIS — J302 Other seasonal allergic rhinitis: Secondary | ICD-10-CM | POA: Diagnosis not present

## 2018-12-01 DIAGNOSIS — I119 Hypertensive heart disease without heart failure: Secondary | ICD-10-CM | POA: Diagnosis not present

## 2018-12-01 DIAGNOSIS — F329 Major depressive disorder, single episode, unspecified: Secondary | ICD-10-CM

## 2018-12-01 DIAGNOSIS — E1122 Type 2 diabetes mellitus with diabetic chronic kidney disease: Secondary | ICD-10-CM | POA: Diagnosis not present

## 2018-12-01 DIAGNOSIS — E1151 Type 2 diabetes mellitus with diabetic peripheral angiopathy without gangrene: Secondary | ICD-10-CM | POA: Diagnosis not present

## 2018-12-01 DIAGNOSIS — N183 Chronic kidney disease, stage 3 (moderate): Secondary | ICD-10-CM | POA: Diagnosis not present

## 2018-12-01 DIAGNOSIS — E782 Mixed hyperlipidemia: Secondary | ICD-10-CM | POA: Diagnosis not present

## 2018-12-01 DIAGNOSIS — I10 Essential (primary) hypertension: Secondary | ICD-10-CM

## 2018-12-01 DIAGNOSIS — R2681 Unsteadiness on feet: Secondary | ICD-10-CM

## 2018-12-01 DIAGNOSIS — L853 Xerosis cutis: Secondary | ICD-10-CM | POA: Diagnosis not present

## 2018-12-01 DIAGNOSIS — Z794 Long term (current) use of insulin: Secondary | ICD-10-CM | POA: Diagnosis not present

## 2018-12-01 DIAGNOSIS — F32A Depression, unspecified: Secondary | ICD-10-CM

## 2018-12-01 DIAGNOSIS — I25119 Atherosclerotic heart disease of native coronary artery with unspecified angina pectoris: Secondary | ICD-10-CM | POA: Diagnosis not present

## 2018-12-01 NOTE — Progress Notes (Signed)
Patient ID: [name], male   DOB: March 23, 1922, 85 y.o.   MRN: [id number] This service is provided via telemedicine  No vital signs collected/recorded due to the encounter was a telemedicine visit.   Location of patient (ex: home, work):  home  Patient consents to a telephone visit:  yes  Location of the provider (ex: office, home):  office  Name of any referring provider:  [facility] NGETICH, NP  Names of all persons participating in the telemedicine service and their role in the encounter:  PATIENT, [name], [place], [facility] NGETICH, NP  Time spent on call:  4:16  Provider: Dinah Ngetich FNP-C   [name], [name] Bucks, NP  Patient Care Team: [name], [name] Bucks, NP as PCP - General (Family Medicine) [name], [name] as Referring Physician (Optometry) [name] Lesch Lezlie Octave, MD as Consulting Physician (Neurology)  Extended Emergency Contact Information Primary Emergency Contact: [name] Address: [address]          [place], [place] [address] [name] of [place] Phone: [phone] Relation: Daughter Secondary Emergency Contact: [name] [place] Phone: [phone] Mobile Phone: [phone] Relation: Other   Goals of care: Advanced Directive information Advanced Directives 08/31/2018  Does Patient Have a Medical Advance Directive? No  Type of Advance Directive -  Does patient want to make changes to medical advance directive? -  Copy of [place] in Chart? -  Would patient like information on creating a medical advance directive? No - Patient declined     Chief Complaint  Patient presents with  . video visit    62mth follow-up    HPI:  Pt is a 85 y.o. male seen today for medical management of chronic diseases.He is denies any acute issues.He is seen with daughter present via video call at home.Temperature 96.9,B/p 110/74,HR 71 checked by daughter during visit.     Type 2 DM- Daughter checks CBG occasional  readings in the upper 70's-100's.currently of medication.eats snack at bedtime.Denies any signs of hypo/hyperglycemia.Due for annual foot exam.Annual eye exam up to date.Hgb A1C drawn prior to visit results pending.   Hypertension - does not check B/p daily.currently on losartan 50 mg tablet daily,HZCT 25 mg tablet,Amlodipine 5 mg tablet daily and Furosemide 20 mg tablet daily as needed.Daughter states has been giving Furosemide daily didn't know what medication was.He does not have a weighing machine but will be buying one. On ASA daily. CBC,CMP done today results pending.  Hyperlipidemia - Daughter takes care of his diet states eat low carbo's and low saturated fats diet.He lost his lower denture.currently on simvastatin 20 mg tablet daily.   Depression - Daughter states his mood has been stable on lexapro 5 mg tablet daily.  Seasonal allergies - symptoms acting up due to pollen but medication effective. on Flonase 50 mcg spray and loratadine 10 mg capsule daily.   Unsteady gait - wheelchair ordered but daughter states patient refused to use wheelchair.Prefers to use cane.No recent fall episodes except rolled out of bed.   Dry skin - daughter states forgot the name of the ointment recommended in the past but his legs still dry.  Past Medical History:  Diagnosis Date  . Arthritis   . CAD (coronary artery disease)    s/p DES to LAD 06/2013  . Depression   . Dyslipidemia   . Glaucoma   . Hyperglycemia 04/17/2014  . Hyperlipidemia   . Hypertension   . Stroke ([place])   . Type 2 diabetes mellitus ([place])  Past Surgical History:  Procedure Laterality Date  . CARDIAC CATHETERIZATION  06/2013   DES to LAD by Dr Tamala Julian  . Right Cataract      Allergies  Allergen Reactions  . Lisinopril Cough    Allergies as of 12/01/2018      Reactions   Lisinopril Cough      Medication List       Accurate as of Dec 01, 2018  2:39 PM. Always use your most recent med list.        Accu-Chek Aviva  Soln Use as Directed. Dx: E11.51   accu-chek softclix lancets Use as instructed with checking blood sugar three times daily. Dx: E11.51   albuterol 108 (90 Base) MCG/ACT inhaler Commonly known as:  VENTOLIN HFA INHALE TWO PUFFS EVERY 4-6 HOURS AS NEEDED FOR DIFFICULTY BREATHING,WHEEZING OR COUGH   amLODipine 5 MG tablet Commonly known as:  NORVASC Take 1 tablet (5 mg total) by mouth daily.   aspirin EC 81 MG tablet Take 1 tablet (81 mg total) by mouth daily.   B-D SINGLE USE SWABS REGULAR Pads USE AS DIRECTED WITH CHECKING BLOOD SUGAR THREE TIMES DAILY   escitalopram 5 MG tablet Commonly known as:  LEXAPRO TAKE 1 TABLET EVERY DAY   fluticasone 50 MCG/ACT nasal spray Commonly known as:  FLONASE Place 2 sprays into both nostrils daily.   furosemide 20 MG tablet Commonly known as:  LASIX Take 1 tablet (20 mg total) by mouth daily as needed for edema.   glucose blood test strip Commonly known as:  Accu-Chek Aviva Use to test blood sugar three times daily. Dx: E11.51   hydrochlorothiazide 25 MG tablet Commonly known as:  HYDRODIURIL Take 1 tablet (25 mg total) by mouth daily.   Insulin Glargine (1 Unit Dial) 300 UNIT/ML Sopn Commonly known as:  Toujeo SoloStar Inject 65 Units into the skin daily.   Insulin Pen Needle 31G X 5 MM Misc Commonly known as:  B-D UF III MINI PEN NEEDLES USE AS DIRECTED.   Loratadine 10 MG Caps Take 1 capsule (10 mg total) by mouth daily.   losartan 50 MG tablet Commonly known as:  COZAAR Take 1 tablet (50 mg total) by mouth daily.   Memantine HCl-Donepezil HCl 28-10 MG Cp24 Commonly known as:  Namzaric Take 1 capsule by mouth daily.   Sentry Senior Tabs Take 1 tablet by mouth daily.   simvastatin 20 MG tablet Commonly known as:  ZOCOR Take 1 tablet (20 mg total) by mouth daily.       Review of Systems  Constitutional: Negative for appetite change, chills and fever.  HENT: Negative for congestion, rhinorrhea, sinus pressure,  sinus pain, sneezing and sore throat.   Eyes: Negative for discharge, redness and itching.  Respiratory: Negative for cough, chest tightness, shortness of breath and wheezing.   Cardiovascular: Negative for chest pain, palpitations and leg swelling.  Gastrointestinal: Negative for abdominal distention, abdominal pain, constipation, diarrhea, nausea and vomiting.  Endocrine: Negative for cold intolerance, heat intolerance, polydipsia, polyphagia and polyuria.  Genitourinary: Negative for difficulty urinating, dysuria, flank pain, frequency and urgency.  Musculoskeletal: Positive for gait problem.  Neurological: Negative for dizziness, light-headedness and headaches.  Hematological: Does not bruise/bleed easily.  Psychiatric/Behavioral: Positive for confusion and sleep disturbance. Negative for agitation.    Immunization History  Administered Date(s) Administered  . Influenza, High Dose Seasonal PF 06/22/2013, 05/30/2015  . Influenza,inj,Quad PF,6+ Mos 04/19/2014, 04/02/2016, 04/01/2017, 04/26/2018  . Pneumococcal Conjugate-13 07/25/2014  . Pneumococcal Polysaccharide-23 08/03/2011,  06/22/2013  . Td 10/22/2013  . Tdap 07/30/2015  . Zoster Recombinat (Shingrix) 05/20/2017, 07/20/2017   Pertinent  Health Maintenance Due  Topic Date Due  . OPHTHALMOLOGY EXAM  06/14/2018  . FOOT EXAM  10/05/2018  . HEMOGLOBIN A1C  03/01/2019  . INFLUENZA VACCINE  03/03/2019  . PNA vac Low Risk Adult  Completed   Fall Risk  12/01/2018 08/31/2018 04/26/2018 03/07/2018 01/20/2018  Falls in the past year? 0 1 No No No  Number falls in past yr: 0 0 - - -  Injury with Fall? 0 0 - - -  Comment - - - - -  Risk Factor Category  - - - - -    Vitals:   12/01/18 1437  BP: 110/74  Pulse: 71  Temp: (!) 96.9 F (36.1 C)   There is no height or weight on file to calculate BMI. Physical Exam  Unable to complete physical exam on video call.bilateral lower extremities observed no worsening of edema. dry scaly  skin.  Labs reviewed: Recent Labs    01/16/18 0916 04/26/18 0827 08/31/18 1034  NA 143 140 145  K 4.4 4.2 3.9  CL 102 100 103  CO2 34* 31 35*  GLUCOSE 150* 321* 82  BUN 22 19 16   CREATININE 1.54* 1.45* 1.40*  CALCIUM 9.6 9.6 9.8   Recent Labs    01/16/18 0916 04/26/18 0827 08/31/18 1034  AST 25 27 31   ALT 19 19 22   BILITOT 0.3 0.3 0.5  PROT 6.9 6.4 6.7   Recent Labs    04/26/18 0827 07/19/18 0922 08/31/18 1034  WBC 5.4 4.9 4.7  NEUTROABS 2,808 1,955 2,247  HGB 10.5* 11.6* 11.8*  HCT 32.2* 34.3* 35.2*  MCV 93.3 92.7 92.9  PLT 174 187 180   Lab Results  Component Value Date   TSH 1.46 08/31/2018   Lab Results  Component Value Date   HGBA1C 7.3 (H) 08/31/2018   Lab Results  Component Value Date   CHOL 127 08/31/2018   HDL 32 (L) 08/31/2018   LDLCALC 74 08/31/2018   LDLDIRECT 92.0 11/24/2015   TRIG 120 08/31/2018   CHOLHDL 4.0 08/31/2018    Significant Diagnostic Results in last 30 days:  No results found.  Assessment/Plan 1. Type 2 diabetes mellitus with stage 3 chronic kidney disease, with long-term current use of insulin (HCC) CBG stable.diet control.CCD diet recommended. A1C result pending.   2. Essential hypertension B/p stable.continue on losartan 50 mg tablet daily,HZCT 25 mg tablet,Amlodipine 5 mg tablet daily and Furosemide 20 mg tablet daily as needed.on ASA and Statin for cardiovascular event prevention.CBC,CMP results pending.  3. Mixed hyperlipidemia Continue on low carbo's and low saturated fats diet and simvastatin 20 mg tablet daily.lipid panel results pending.   4. Depression, unspecified depression type Mood stable.continue on lexapro 5 mg tablet daily.monitor for mood changes.   5. Seasonal allergies Continue on Flonase 50 mcg spray and loratadine 10 mg capsule daily.  6. Unsteady gait Continue fall and safety precautions.has wheelchair but prefers cane.Remains high risk for falls due to poor safety awareness.  7. Dry skin  dermatitis Apply Aquaphor ointment to both legs twice daily until dryness resolved.encouraged hydration.  Family/ staff Communication: Reviewed plan of care with patient and Daughter.  Labs/tests ordered: None   Time spent with patient and daughter 25 minutes >50% time spent counseling; reviewing medical record; tests; labs; and developing future plan of care.  Sandrea Hughs, NP

## 2018-12-02 LAB — HEMOGLOBIN A1C
Hgb A1c MFr Bld: 6.3 % of total Hgb — ABNORMAL HIGH (ref <5.7)
Mean Plasma Glucose: 134 (calc)
eAG (mmol/L): 7.4 (calc)

## 2018-12-02 LAB — COMPLETE METABOLIC PANEL WITH GFR
AG Ratio: 1.6 (calc) (ref 1.0–2.5)
ALT: 32 U/L (ref 9–46)
AST: 36 U/L — ABNORMAL HIGH (ref 10–35)
Albumin: 4.4 g/dL (ref 3.6–5.1)
Alkaline phosphatase (APISO): 72 U/L (ref 35–144)
BUN/Creatinine Ratio: 12 (calc) (ref 6–22)
BUN: 19 mg/dL (ref 7–25)
CO2: 35 mmol/L — ABNORMAL HIGH (ref 20–32)
Calcium: 9.9 mg/dL (ref 8.6–10.3)
Chloride: 100 mmol/L (ref 98–110)
Creat: 1.63 mg/dL — ABNORMAL HIGH (ref 0.70–1.11)
GFR, Est African American: 41 mL/min/{1.73_m2} — ABNORMAL LOW (ref >=60)
GFR, Est Non African American: 36 mL/min/{1.73_m2} — ABNORMAL LOW (ref >=60)
Globulin: 2.7 g/dL (calc) (ref 1.9–3.7)
Glucose, Bld: 42 mg/dL — ABNORMAL LOW (ref 65–99)
Potassium: 4.3 mmol/L (ref 3.5–5.3)
Sodium: 141 mmol/L (ref 135–146)
Total Bilirubin: 0.5 mg/dL (ref 0.2–1.2)
Total Protein: 7.1 g/dL (ref 6.1–8.1)

## 2018-12-02 LAB — LIPID PANEL
Cholesterol: 147 mg/dL (ref <200)
HDL: 37 mg/dL — ABNORMAL LOW (ref >=40)
LDL Cholesterol (Calc): 89 mg/dL (calc)
Non-HDL Cholesterol (Calc): 110 mg/dL (calc) (ref <130)
Total CHOL/HDL Ratio: 4 (calc) (ref <5.0)
Triglycerides: 117 mg/dL (ref <150)

## 2018-12-02 LAB — CBC WITH DIFFERENTIAL/PLATELET
Absolute Monocytes: 767 cells/uL (ref 200–950)
Basophils Absolute: 20 cells/uL (ref 0–200)
Basophils Relative: 0.3 %
Eosinophils Absolute: 111 cells/uL (ref 15–500)
Eosinophils Relative: 1.7 %
HCT: 34.1 % — ABNORMAL LOW (ref 38.5–50.0)
Hemoglobin: 11.6 g/dL — ABNORMAL LOW (ref 13.2–17.1)
Lymphs Abs: 1580 cells/uL (ref 850–3900)
MCH: 31.7 pg (ref 27.0–33.0)
MCHC: 34 g/dL (ref 32.0–36.0)
MCV: 93.2 fL (ref 80.0–100.0)
MPV: 10 fL (ref 7.5–12.5)
Monocytes Relative: 11.8 %
Neutro Abs: 4024 cells/uL (ref 1500–7800)
Neutrophils Relative %: 61.9 %
Platelets: 200 10*3/uL (ref 140–400)
RBC: 3.66 10*6/uL — ABNORMAL LOW (ref 4.20–5.80)
RDW: 12.5 % (ref 11.0–15.0)
Total Lymphocyte: 24.3 %
WBC: 6.5 10*3/uL (ref 3.8–10.8)

## 2018-12-27 ENCOUNTER — Ambulatory Visit: Payer: Self-pay | Admitting: Family

## 2018-12-29 ENCOUNTER — Encounter: Payer: Self-pay | Admitting: Family

## 2018-12-29 ENCOUNTER — Other Ambulatory Visit: Payer: Self-pay

## 2018-12-29 ENCOUNTER — Ambulatory Visit
Admission: RE | Admit: 2018-12-29 | Discharge: 2018-12-29 | Disposition: A | Discharge status: Home / Self Care | Payer: Medicare HMO | Source: Ambulatory Visit | Attending: Family | Admitting: Family

## 2018-12-29 ENCOUNTER — Ambulatory Visit (INDEPENDENT_AMBULATORY_CARE_PROVIDER_SITE_OTHER): Payer: Medicare HMO | Admitting: Family

## 2018-12-29 ENCOUNTER — Telehealth: Payer: Self-pay

## 2018-12-29 VITALS — BP 150/80 | HR 81 | Temp 98.3°F | Ht 71.0 in | Wt 187.2 lb

## 2018-12-29 DIAGNOSIS — H6121 Impacted cerumen, right ear: Secondary | ICD-10-CM

## 2018-12-29 DIAGNOSIS — R05 Cough: Secondary | ICD-10-CM

## 2018-12-29 DIAGNOSIS — F0391 Unspecified dementia with behavioral disturbance: Secondary | ICD-10-CM

## 2018-12-29 DIAGNOSIS — R059 Cough, unspecified: Secondary | ICD-10-CM

## 2018-12-29 DIAGNOSIS — R2681 Unsteadiness on feet: Secondary | ICD-10-CM | POA: Diagnosis not present

## 2018-12-29 DIAGNOSIS — L309 Dermatitis, unspecified: Secondary | ICD-10-CM

## 2018-12-29 DIAGNOSIS — J3489 Other specified disorders of nose and nasal sinuses: Secondary | ICD-10-CM | POA: Diagnosis not present

## 2018-12-29 DIAGNOSIS — R600 Localized edema: Secondary | ICD-10-CM | POA: Diagnosis not present

## 2018-12-29 DIAGNOSIS — F03B18 Unspecified dementia, moderate, with other behavioral disturbance: Secondary | ICD-10-CM

## 2018-12-29 MED ORDER — TRIAMCINOLONE ACETONIDE 0.1 % EX CREA: 1.0000 "application " | TOPICAL_CREAM | Freq: Two times a day (BID) | CUTANEOUS | 0 refills | Status: DC

## 2018-12-29 MED ORDER — CARBAMIDE PEROXIDE 6.5 % OT SOLN: 5.0000 [drp] | Freq: Two times a day (BID) | OTIC | 0 refills | Status: AC

## 2018-12-29 MED ORDER — AQUAPHOR EX OINT: TOPICAL_OINTMENT | Freq: Two times a day (BID) | CUTANEOUS | 0 refills | Status: DC

## 2018-12-29 MED ORDER — TORSEMIDE 20 MG PO TABS: 20.0000 mg | ORAL_TABLET | ORAL | 1 refills | Status: DC

## 2018-12-29 NOTE — Patient Instructions (Signed)
1. Apply Debrox 6.5 % otic solution instil 5 drops into right ear twice daily x 4 days then follow up at the office for ear lavage.  2. Apply Triamcinolone cream to lower extremities twice daily x 7 days for skin dermatitis   3. Apply Aquaphor ointment to lower extremities twice daily for dry skin   4. Apply knee compression stocking to both legs in the morning and remove at bedtime for leg swelling

## 2018-12-29 NOTE — Telephone Encounter (Signed)
Rx sent to pharmacy   

## 2018-12-29 NOTE — Progress Notes (Signed)
Provider: Marlowe Sax FNP-C  , Nelda Bucks, NP  Patient Care Team: , Nelda Bucks, NP as PCP - General (Family Medicine) Macarthur Critchley, [place] as Referring Physician (Optometry) Delice Lesch Lezlie Octave, MD as Consulting Physician (Neurology)  Extended Emergency Contact Information Primary Emergency Contact: [facility] Address: [address]          [place], [place] [address] [name] of [place] Phone: [phone] Relation: Daughter Secondary Emergency Contact: [name] [place] of [place] Phone: [phone] Mobile Phone: [phone] Relation: Other  Goals of care: Advanced Directive information Advanced Directives 08/31/2018  Does Patient Have a Medical Advance Directive? No  Type of Advance Directive -  Does patient want to make changes to medical advance directive? -  Copy of [place] in Chart? -  Would patient like information on creating a medical advance directive? No - Patient declined     Chief Complaint  Patient presents with  . Acute Visit     Cough and congestion daughter states duration of week but patient has began taking mucinex and is able to get mucus up now     HPI:  Pt is a 85 y.o. male seen today for an acute visit for evaluation of cough x 1 week.He is here escorted by her daughter who is the primary care giver.Daughter states cough has been non-productive but seems to be loosing up since she gave him mucinex two days ago.she states cough seems to be better today.He has had no fever,chills,shortness of breath or changes in appetite.No recent contact with sick person with COVID-19.    Past Medical History:  Diagnosis Date  . Arthritis   . CAD (coronary artery disease)    s/p DES to LAD 06/2013  . Depression   . Dyslipidemia   . Glaucoma   . Hyperglycemia 04/17/2014  . Hyperlipidemia   . Hypertension   . Stroke ([place])   . Type 2 diabetes mellitus ([place])    Past Surgical History:  Procedure Laterality Date   . CARDIAC CATHETERIZATION  06/2013   DES to LAD by Dr Tamala Julian  . Right Cataract      Allergies  Allergen Reactions  . Lisinopril Cough    Outpatient Encounter Medications as of 12/29/2018  Medication Sig  . albuterol (PROVENTIL HFA;VENTOLIN HFA) 108 (90 Base) MCG/ACT inhaler INHALE TWO PUFFS EVERY 4-6 HOURS AS NEEDED FOR DIFFICULTY BREATHING,WHEEZING OR COUGH  . Alcohol Swabs (B-D SINGLE USE SWABS REGULAR) PADS USE AS DIRECTED WITH CHECKING BLOOD SUGAR THREE TIMES DAILY  . amLODipine (NORVASC) 5 MG tablet Take 1 tablet (5 mg total) by mouth daily.  [name] aspirin EC 81 MG tablet Take 1 tablet (81 mg total) by mouth daily.  . Blood Glucose Calibration (ACCU-CHEK AVIVA) SOLN Use as Directed. Dx: E11.51  . escitalopram (LEXAPRO) 5 MG tablet TAKE 1 TABLET EVERY DAY  . furosemide (LASIX) 20 MG tablet Take 1 tablet (20 mg total) by mouth daily as needed for edema.  [name] glucose blood (ACCU-CHEK AVIVA) test strip Use to test blood sugar three times daily. Dx: E11.51  . hydrochlorothiazide (HYDRODIURIL) 25 MG tablet Take 1 tablet (25 mg total) by mouth daily.  . Insulin Glargine, 1 Unit Dial, (TOUJEO SOLOSTAR) 300 UNIT/ML SOPN Inject 65 Units into the skin daily.  . Insulin Pen Needle (B-D UF III MINI PEN NEEDLES) 31G X 5 MM MISC USE AS DIRECTED.  Elmore Guise Devices (ACCU-CHEK SOFTCLIX) lancets Use as instructed with checking blood sugar three times daily. Dx: E11.51  .  Loratadine 10 MG CAPS Take 1 capsule (10 mg total) by mouth daily.  [name] losartan (COZAAR) 50 MG tablet Take 1 tablet (50 mg total) by mouth daily.  . Memantine HCl-Donepezil HCl (NAMZARIC) 28-10 MG CP24 Take 1 capsule by mouth daily.  . Multiple Vitamins-Minerals (SENTRY SENIOR) TABS Take 1 tablet by mouth daily.  . simvastatin (ZOCOR) 20 MG tablet Take 1 tablet (20 mg total) by mouth daily.  . [DISCONTINUED] fluticasone (FLONASE) 50 MCG/ACT nasal spray Place 2 sprays into both nostrils daily.    No facility-administered encounter  medications on file as of 12/29/2018.     Review of Systems  Constitutional: Negative for appetite change, chills, fatigue and fever.  HENT: Positive for rhinorrhea. Negative for congestion, sinus pressure, sinus pain, sneezing, sore throat and trouble swallowing.   Respiratory: Positive for cough. Negative for chest tightness, shortness of breath and wheezing.   Cardiovascular: Negative for chest pain, palpitations and leg swelling.  Gastrointestinal: Negative for abdominal distention, abdominal pain, constipation, diarrhea and nausea.  Neurological: Negative for dizziness and headaches.    Immunization History  Administered Date(s) Administered  . Influenza, High Dose Seasonal PF 06/22/2013, 05/30/2015  . Influenza,inj,Quad PF,6+ Mos 04/19/2014, 04/02/2016, 04/01/2017, 04/26/2018  . Pneumococcal Conjugate-13 07/25/2014  . Pneumococcal Polysaccharide-23 08/03/2011, 06/22/2013  . Td 10/22/2013  . Tdap 07/30/2015  . Zoster Recombinat (Shingrix) 05/20/2017, 07/20/2017   Pertinent  Health Maintenance Due  Topic Date Due  . OPHTHALMOLOGY EXAM  06/14/2018  . FOOT EXAM  10/05/2018  . INFLUENZA VACCINE  03/03/2019  . HEMOGLOBIN A1C  06/03/2019  . PNA vac Low Risk Adult  Completed   Fall Risk  12/29/2018 12/01/2018 08/31/2018 04/26/2018 03/07/2018  Falls in the past year? 1 0 1 No No  Number falls in past yr: 0 0 0 - -  Injury with Fall? 0 0 0 - -  Comment - - - - -  Risk Factor Category  - - - - -    Vitals:   12/29/18 0934  BP: (!) 150/80  Pulse: 81  Temp: 98.3 F (36.8 C)  TempSrc: Oral  SpO2: 93%  Weight: 187 lb 3.2 oz (84.9 kg)  Height: 5\' 11"  (1.803 m)   Body mass index is 26.11 kg/m. Physical Exam Vitals signs reviewed.  Constitutional:      General: He is not in acute distress.    Appearance: He is overweight. He is not ill-appearing.  HENT:     Head: Normocephalic.     Comments: Missing all teeth     Right Ear: There is impacted cerumen.     Left Ear: Tympanic  membrane, ear canal and external ear normal. There is no impacted cerumen.     Nose: Rhinorrhea present. No congestion.     Mouth/Throat:     Mouth: Mucous membranes are moist.     Pharynx: Oropharynx is clear. No oropharyngeal exudate or posterior oropharyngeal erythema.  Eyes:     General: No scleral icterus.       Right eye: No discharge.        Left eye: No discharge.     Conjunctiva/sclera: Conjunctivae normal.     Pupils: Pupils are equal, round, and reactive to light.  Neck:     Musculoskeletal: Normal range of motion. No neck rigidity or muscular tenderness.  Cardiovascular:     Rate and Rhythm: Normal rate and regular rhythm.     Pulses: Normal pulses.     Heart sounds: Normal heart sounds. No murmur.  No friction rub. No gallop.   Pulmonary:     Effort: Pulmonary effort is normal. No respiratory distress.     Breath sounds: No rhonchi or rales.     Comments: Upper lobes diminished wheezes with decreased breath sounds to bilateral bases  Chest:     Chest wall: No tenderness.  Abdominal:     General: Abdomen is flat. Bowel sounds are normal. There is no distension.     Palpations: There is no mass.     Tenderness: There is no abdominal tenderness. There is no right CVA tenderness, left CVA tenderness, guarding or rebound.  Musculoskeletal:        General: No tenderness.     Comments: Unsteady gait ambulating with cane.bilateral lower extremities 1-2+ edema.   Lymphadenopathy:     Cervical: No cervical adenopathy.  Skin:    General: Skin is warm and dry.     Coloration: Skin is not pale.     Findings: No bruising, erythema or rash.     Comments: Bilateral lower extremities dry skin with hyperpigmentation and sclerotic skin to lower part of the legs.    Neurological:     Mental Status: He is alert. Mental status is at baseline.     Cranial Nerves: No cranial nerve deficit.     Sensory: No sensory deficit.     Coordination: Coordination normal.     Gait: Gait abnormal.   Psychiatric:        Mood and Affect: Mood normal.        Speech: Speech normal.        Behavior: Behavior is cooperative.        Thought Content: Thought content normal.        Cognition and Memory: Cognition is impaired. Memory is impaired.    Labs reviewed: Recent Labs    04/26/18 0827 08/31/18 1034 12/01/18 1032  NA 140 145 141  K 4.2 3.9 4.3  CL 100 103 100  CO2 31 35* 35*  GLUCOSE 321* 82 42*  BUN 19 16 19   CREATININE 1.45* 1.40* 1.63*  CALCIUM 9.6 9.8 9.9   Recent Labs    04/26/18 0827 08/31/18 1034 12/01/18 1032  AST 27 31 36*  ALT 19 22 32  BILITOT 0.3 0.5 0.5  PROT 6.4 6.7 7.1   Recent Labs    07/19/18 0922 08/31/18 1034 12/01/18 1032  WBC 4.9 4.7 6.5  NEUTROABS 1,955 2,247 4,024  HGB 11.6* 11.8* 11.6*  HCT 34.3* 35.2* 34.1*  MCV 92.7 92.9 93.2  PLT 187 180 200   Lab Results  Component Value Date   TSH 1.46 08/31/2018   Lab Results  Component Value Date   HGBA1C 6.3 (H) 12/01/2018   Lab Results  Component Value Date   CHOL 147 12/01/2018   HDL 37 (L) 12/01/2018   LDLCALC 89 12/01/2018   LDLDIRECT 92.0 11/24/2015   TRIG 117 12/01/2018   CHOLHDL 4.0 12/01/2018    Significant Diagnostic Results in last 30 days:  No results found.  Assessment/Plan 1. Cough Afebrile.Non-productive though has loosen up per daughter since starting on mucinex.bilateral diminished wheezes on upper lobe and diminished bases noted on auscultation.  - DG Chest 2 View; Future to rule out acute abnormalities.   2. Bilateral lower extremity edema 1-2+ edema.Weight reviewed has had a 4.2 lbs weight loss over 4 months.continue on furosemide 20 mg tablet daily as needed.  - Knee high Compression stockings apply to both legs on in the morning and  remove at bedtime.   3. Impacted cerumen of right ear TM not visualized due to cerumen impaction. Debrox 6.5% otic solution instil 5 drops into right ear then follow up in 4 days for cerumen removal/lavage.   4. Moderate  dementia with behavioral disturbance ([place]) No new behavioral issues.Daughter states cries at times about his dentures but no worsening symptoms.will consider increasing his lexapro 5 mg tablet if this persist. Continue Namzaric 28-10 mg capsule daily and with supportive care.  5. Unsteady gait No recent falls.ambulates with a cane.fall precautions discussed.   6. Rhinorrhea  Afebrile.Clear nasal drainage noted.continue on loratadine 10 mg capsule daily.  7. Dermatitis   Bilateral lower extremities skin dryness with hyperpigmentation and sclerotic skin tissue to the lower part of the leg.   Family/ staff Communication: Reviewed plan of care with patient and daughter.   Labs/tests ordered: - DG Chest 2 View; Future to rule out acute abnormalities.   Sandrea Hughs, NP

## 2019-01-02 ENCOUNTER — Ambulatory Visit (INDEPENDENT_AMBULATORY_CARE_PROVIDER_SITE_OTHER): Payer: Medicare HMO | Admitting: Family

## 2019-01-02 ENCOUNTER — Other Ambulatory Visit: Payer: Self-pay

## 2019-01-02 ENCOUNTER — Encounter: Payer: Self-pay | Admitting: Family

## 2019-01-02 VITALS — BP 130/84 | HR 79 | Temp 98.2°F | Resp 18 | Ht 71.0 in | Wt 187.6 lb

## 2019-01-02 DIAGNOSIS — E1122 Type 2 diabetes mellitus with diabetic chronic kidney disease: Secondary | ICD-10-CM

## 2019-01-02 DIAGNOSIS — Z794 Long term (current) use of insulin: Secondary | ICD-10-CM | POA: Diagnosis not present

## 2019-01-02 DIAGNOSIS — R05 Cough: Secondary | ICD-10-CM

## 2019-01-02 DIAGNOSIS — H6121 Impacted cerumen, right ear: Secondary | ICD-10-CM

## 2019-01-02 DIAGNOSIS — N183 Chronic kidney disease, stage 3 (moderate): Secondary | ICD-10-CM

## 2019-01-02 DIAGNOSIS — R059 Cough, unspecified: Secondary | ICD-10-CM

## 2019-01-02 DIAGNOSIS — J449 Chronic obstructive pulmonary disease, unspecified: Secondary | ICD-10-CM | POA: Diagnosis not present

## 2019-01-02 NOTE — Progress Notes (Signed)
Provider: Marlowe Sax FNP-C  Rocquel Askren, Nelda Bucks, NP  Patient Care Team: Lainie Daubert, Nelda Bucks, NP as PCP - General (Family Medicine) Macarthur Critchley, [place] as Referring Physician (Optometry) Delice Lesch Lezlie Octave, MD as Consulting Physician (Neurology)  Extended Emergency Contact Information Primary Emergency Contact: [facility] Address: [address]          [place], [place] [address] [name] of [place] Phone: [phone] Relation: Daughter Secondary Emergency Contact: [name] [place] of [place] Phone: [phone] Mobile Phone: [phone] Relation: Other  Goals of care: Advanced Directive information Advanced Directives 08/31/2018  Does Patient Have a Medical Advance Directive? No  Type of Advance Directive -  Does patient want to make changes to medical advance directive? -  Copy of [place] in Chart? -  Would patient like information on creating a medical advance directive? No - Patient declined     Chief Complaint  Patient presents with   Acute Visit    Impacted Ear     HPI:  Pt is a 85 y.o. male seen today for an acute visit for evaluation of right ear cerumen impaction.He is here escorted by daughter who is the primary care provider.she has been applying debrox to his right ear as directed.He denies any pain in the ear.I saw him here 12/29/2018 for a non-productive cough.chest  X-ray ordered showed small to moderate right pleural effusion with right base atelectasis.His CR 1.63 previous 1.40 furosemide was discontinued and Torsemide 20 mg tablet one by mouth daily x 5 days ordered then Torsemide 20 mg tablet one by mouth daily for weight gain,worsening edema or shortness of breath.Patient's daughter states awaiting for her welfare check to buy a weighing scale for patient but currently unable to check weight at home.overall,cough has loosen up.No fever,chills,worsening edema or shortness of breath reported.He has not been in contact with  sick person with COVID-19 or travel.he has a significant medical history of COPD has Albuterol inhaler at home but has not used it.I've discussed with daughter use of inhaler whenever patient has any wheezing.    Past Medical History:  Diagnosis Date   Arthritis    CAD (coronary artery disease)    s/p DES to LAD 06/2013   Depression    Dyslipidemia    Glaucoma    Hyperglycemia 04/17/2014   Hyperlipidemia    Hypertension    Stroke ([place])    Type 2 diabetes mellitus ([place])    Past Surgical History:  Procedure Laterality Date   CARDIAC CATHETERIZATION  06/2013   DES to LAD by Dr Tamala Julian   Right Cataract      Allergies  Allergen Reactions   Lisinopril Cough    Outpatient Encounter Medications as of 01/02/2019  Medication Sig   albuterol (PROVENTIL HFA;VENTOLIN HFA) 108 (90 Base) MCG/ACT inhaler INHALE TWO PUFFS EVERY 4-6 HOURS AS NEEDED FOR DIFFICULTY BREATHING,WHEEZING OR COUGH   Alcohol Swabs (B-D SINGLE USE SWABS REGULAR) PADS USE AS DIRECTED WITH CHECKING BLOOD SUGAR THREE TIMES DAILY   amLODipine (NORVASC) 5 MG tablet Take 1 tablet (5 mg total) by mouth daily.   aspirin EC 81 MG tablet Take 1 tablet (81 mg total) by mouth daily.   Blood Glucose Calibration (ACCU-CHEK AVIVA) SOLN Use as Directed. Dx: E11.51   carbamide peroxide (DEBROX) 6.5 % OTIC solution Place 5 drops into the right ear 2 (two) times daily for 4 days.   escitalopram (LEXAPRO) 5 MG tablet TAKE 1 TABLET EVERY DAY   glucose blood (ACCU-CHEK AVIVA)  test strip Use to test blood sugar three times daily. Dx: E11.51   hydrochlorothiazide (HYDRODIURIL) 25 MG tablet Take 1 tablet (25 mg total) by mouth daily.   Insulin Glargine, 1 Unit Dial, (TOUJEO SOLOSTAR) 300 UNIT/ML SOPN Inject 65 Units into the skin daily.   Insulin Pen Needle (B-D UF III MINI PEN NEEDLES) 31G X 5 MM MISC USE AS DIRECTED.   Lancet Devices (ACCU-CHEK SOFTCLIX) lancets Use as instructed with checking blood sugar three times  daily. Dx: E11.51   Loratadine 10 MG CAPS Take 1 capsule (10 mg total) by mouth daily.   losartan (COZAAR) 50 MG tablet Take 1 tablet (50 mg total) by mouth daily.   Memantine HCl-Donepezil HCl (NAMZARIC) 28-10 MG CP24 Take 1 capsule by mouth daily.   mineral oil-hydrophilic petrolatum (AQUAPHOR) ointment Apply topically 2 (two) times daily. To legs   Multiple Vitamins-Minerals (SENTRY SENIOR) TABS Take 1 tablet by mouth daily.   simvastatin (ZOCOR) 20 MG tablet Take 1 tablet (20 mg total) by mouth daily.   torsemide (DEMADEX) 20 MG tablet Take 1 tablet (20 mg total) by mouth See admin instructions. For 5 days then take 1 by mouth daily as needed   triamcinolone cream (KENALOG) 0.1 % Apply 1 application topically 2 (two) times daily for 14 days. To lower extremities   No facility-administered encounter medications on file as of 01/02/2019.     Review of Systems  Reason unable to perform ROS: additional information provided by daughter   Constitutional: Negative for appetite change, chills, fatigue and fever.  HENT: Positive for hearing loss. Negative for congestion, rhinorrhea, sinus pressure, sinus pain, sneezing and sore throat.   Eyes: Negative for discharge, redness, itching and visual disturbance.  Respiratory: Negative for chest tightness and shortness of breath.        Daughter reports cough has loosen up with mucinex but has wheezing.Has not used his albuterol inhaler.    Cardiovascular: Positive for leg swelling. Negative for chest pain and palpitations.  Musculoskeletal: Positive for gait problem.       Uses right hand cane   Neurological: Negative for dizziness, light-headedness and headaches.    Immunization History  Administered Date(s) Administered   Influenza, High Dose Seasonal PF 06/22/2013, 05/30/2015   Influenza,inj,Quad PF,6+ Mos 04/19/2014, 04/02/2016, 04/01/2017, 04/26/2018   Pneumococcal Conjugate-13 07/25/2014   Pneumococcal Polysaccharide-23  08/03/2011, 06/22/2013   Td 10/22/2013   Tdap 07/30/2015   Zoster Recombinat (Shingrix) 05/20/2017, 07/20/2017   Pertinent  Health Maintenance Due  Topic Date Due   OPHTHALMOLOGY EXAM  06/14/2018   FOOT EXAM  10/05/2018   INFLUENZA VACCINE  03/03/2019   HEMOGLOBIN A1C  06/03/2019   PNA vac Low Risk Adult  Completed   Fall Risk  01/02/2019 12/29/2018 12/01/2018 08/31/2018 04/26/2018  Falls in the past year? 0 1 0 1 No  Number falls in past yr: 0 0 0 0 -  Injury with Fall? 0 0 0 0 -  Comment - - - - -  Risk Factor Category  - - - - -    Vitals:   01/02/19 0958  BP: 130/84  Pulse: 79  Temp: 98.2 F (36.8 C)  TempSrc: Oral  SpO2: (!) 89%  Weight: 187 lb 9.6 oz (85.1 kg)  Height: 5\' 11"  (1.803 m)   Body mass index is 26.16 kg/m. Physical Exam Constitutional:      General: He is not in acute distress.    Appearance: He is overweight. He is not ill-appearing.  HENT:  Head: Normocephalic.     Left Ear: Tympanic membrane, ear canal and external ear normal. There is no impacted cerumen.     Ears:     Comments: Right ear soft cerumen noted.lavaged with warm water.TM clear.No signs of infections.No instrument used to obtain cerumen.     Nose: Nose normal. No congestion or rhinorrhea.     Mouth/Throat:     Mouth: Mucous membranes are moist.     Pharynx: Oropharynx is clear. No oropharyngeal exudate or posterior oropharyngeal erythema.  Eyes:     General: No scleral icterus.       Right eye: No discharge.        Left eye: No discharge.     Conjunctiva/sclera: Conjunctivae normal.     Pupils: Pupils are equal, round, and reactive to light.  Neck:     Musculoskeletal: Normal range of motion. No neck rigidity or muscular tenderness.  Cardiovascular:     Rate and Rhythm: Normal rate and regular rhythm.     Pulses: Normal pulses.     Heart sounds: Normal heart sounds. No murmur. No friction rub. No gallop.   Pulmonary:     Effort: Pulmonary effort is normal. No  respiratory distress.     Breath sounds: No rales.     Comments: Bilateral diminished lung wheezing.  Chest:     Chest wall: No tenderness.  Abdominal:     General: Bowel sounds are normal. There is no distension.     Palpations: Abdomen is soft. There is no mass.     Tenderness: There is no abdominal tenderness. There is no right CVA tenderness, left CVA tenderness, guarding or rebound.  Musculoskeletal: Normal range of motion.        General: No swelling or tenderness.     Comments: Unsteady gait ambulates with cane.Bilateral lower extremities 1-2+ edema.   Lymphadenopathy:     Cervical: No cervical adenopathy.  Skin:    General: Skin is warm and dry.     Coloration: Skin is not pale.     Findings: No erythema or rash.  Neurological:     Mental Status: He is alert. Mental status is at baseline.     Gait: Gait abnormal.  Psychiatric:        Mood and Affect: Mood normal.        Speech: Speech normal.        Behavior: Behavior normal.        Thought Content: Thought content normal.        Cognition and Memory: Memory is impaired.        Judgment: Judgment normal.    Labs reviewed: Recent Labs    04/26/18 0827 08/31/18 1034 12/01/18 1032  NA 140 145 141  K 4.2 3.9 4.3  CL 100 103 100  CO2 31 35* 35*  GLUCOSE 321* 82 42*  BUN 19 16 19   CREATININE 1.45* 1.40* 1.63*  CALCIUM 9.6 9.8 9.9   Recent Labs    04/26/18 0827 08/31/18 1034 12/01/18 1032  AST 27 31 36*  ALT 19 22 32  BILITOT 0.3 0.5 0.5  PROT 6.4 6.7 7.1   Recent Labs    07/19/18 0922 08/31/18 1034 12/01/18 1032  WBC 4.9 4.7 6.5  NEUTROABS 1,955 2,247 4,024  HGB 11.6* 11.8* 11.6*  HCT 34.3* 35.2* 34.1*  MCV 92.7 92.9 93.2  PLT 187 180 200   Lab Results  Component Value Date   TSH 1.46 08/31/2018   Lab Results  Component  Value Date   HGBA1C 6.3 (H) 12/01/2018   Lab Results  Component Value Date   CHOL 147 12/01/2018   HDL 37 (L) 12/01/2018   LDLCALC 89 12/01/2018   LDLDIRECT 92.0  11/24/2015   TRIG 117 12/01/2018   CHOLHDL 4.0 12/01/2018    Significant Diagnostic Results in last 30 days:  Dg Chest 2 View  Result Date: 12/29/2018 CLINICAL DATA:  Productive cough, congestion EXAM: CHEST - 2 VIEW COMPARISON:  07/18/2018 FINDINGS: Small to moderate right pleural effusion. Right base atelectasis. No effusions or focal opacity on the left. Heart is normal size. No acute bony abnormality. IMPRESSION: Small to moderate right pleural effusion with right base atelectasis. Electronically Signed   By: Rolm Baptise M.D.   On: 12/29/2018 11:57    Assessment/Plan 1. Impacted cerumen of right ear Right ear cerumen lavaged with warm water large amounts of cerumen obtained.No instrument used to obtain cerumen.TM clear without any signs of infections.  2. Cough Afebrile.Has improved.bilateral lung wheezing noted.Encouraged to use Albuterol inhaler every 6 hours as needed for cough,wheezing or shortness of breath.continue on Mucinex for cough.Notify provider if running temp >100.5 or symptoms worsen.    3. Type 2 diabetes mellitus with stage 3 chronic kidney disease, with long-term current use of insulin ([place]) Requires referral for annual diabetic foot exam.continue current medication.  - Ambulatory referral to Podiatry  Family/ staff Communication: Reviewed plan of care with patient and daughter.  Labs/tests ordered: None   Kazaria Gaertner C Sarajean Dessert, NP

## 2019-01-04 ENCOUNTER — Telehealth: Payer: Self-pay | Admitting: *Deleted

## 2019-01-04 NOTE — Telephone Encounter (Signed)
Pt's daughter calling asking what size and strength pt needs for the compression hose?   Size  Ankle Thigh Small  18-21 40-62 Medium 21-25 46-70 Large  25-29 54-78 X-Large 29-33   60-81  ** please advise

## 2019-01-12 NOTE — Telephone Encounter (Signed)
Called and discussed provider's response with [facility] patient's daugher and she is unable to get compression socks at pharmacy. Stated she has been to several different medical supply stores in [place] one on [address] and one on [address] in [place]. She said that at medical supply store in [address] and they did not have compression socks. The medical supply store off [address] she was told that they had compression socks but would not except insurance. She is trying to find somewhere that she can apply insurance to get socks and does not know where to go at this point. Please advise.

## 2019-01-12 NOTE — Telephone Encounter (Signed)
Will have to get out of pocket if compression stockings not covered by insurance.

## 2019-01-12 NOTE — Telephone Encounter (Signed)
Routed to [place]

## 2019-01-12 NOTE — Telephone Encounter (Signed)
Called and spoke to [name], patient's daughter about [name]'s response to her question. Daughter stated ok and abruptly ended the call.

## 2019-01-12 NOTE — Telephone Encounter (Signed)
Recommend for the pharmacist to get patient's appropriate measure.Otherwise large size if unable to measure.

## 2019-01-18 ENCOUNTER — Other Ambulatory Visit: Payer: Self-pay

## 2019-01-18 ENCOUNTER — Emergency Department (HOSPITAL_COMMUNITY): Payer: Medicare HMO

## 2019-01-18 ENCOUNTER — Encounter (HOSPITAL_COMMUNITY): Payer: Self-pay

## 2019-01-18 ENCOUNTER — Emergency Department (HOSPITAL_COMMUNITY)
Admission: EM | Admit: 2019-01-18 | Discharge: 2019-01-18 | Disposition: A | Discharge status: Home / Self Care | Payer: Medicare HMO | Attending: Emergency Medicine | Admitting: Emergency Medicine

## 2019-01-18 DIAGNOSIS — I2510 Atherosclerotic heart disease of native coronary artery without angina pectoris: Secondary | ICD-10-CM | POA: Insufficient documentation

## 2019-01-18 DIAGNOSIS — Z794 Long term (current) use of insulin: Secondary | ICD-10-CM | POA: Diagnosis not present

## 2019-01-18 DIAGNOSIS — N183 Chronic kidney disease, stage 3 (moderate): Secondary | ICD-10-CM | POA: Diagnosis not present

## 2019-01-18 DIAGNOSIS — M25562 Pain in left knee: Secondary | ICD-10-CM | POA: Diagnosis not present

## 2019-01-18 DIAGNOSIS — M25552 Pain in left hip: Secondary | ICD-10-CM | POA: Diagnosis not present

## 2019-01-18 DIAGNOSIS — I129 Hypertensive chronic kidney disease with stage 1 through stage 4 chronic kidney disease, or unspecified chronic kidney disease: Secondary | ICD-10-CM | POA: Diagnosis not present

## 2019-01-18 DIAGNOSIS — Z7982 Long term (current) use of aspirin: Secondary | ICD-10-CM | POA: Insufficient documentation

## 2019-01-18 DIAGNOSIS — M79672 Pain in left foot: Secondary | ICD-10-CM | POA: Diagnosis not present

## 2019-01-18 DIAGNOSIS — F0390 Unspecified dementia without behavioral disturbance: Secondary | ICD-10-CM | POA: Diagnosis not present

## 2019-01-18 DIAGNOSIS — S299XXA Unspecified injury of thorax, initial encounter: Secondary | ICD-10-CM | POA: Diagnosis not present

## 2019-01-18 DIAGNOSIS — R918 Other nonspecific abnormal finding of lung field: Secondary | ICD-10-CM | POA: Insufficient documentation

## 2019-01-18 DIAGNOSIS — R52 Pain, unspecified: Secondary | ICD-10-CM | POA: Diagnosis not present

## 2019-01-18 DIAGNOSIS — S99922A Unspecified injury of left foot, initial encounter: Secondary | ICD-10-CM | POA: Diagnosis not present

## 2019-01-18 DIAGNOSIS — M79605 Pain in left leg: Secondary | ICD-10-CM | POA: Insufficient documentation

## 2019-01-18 DIAGNOSIS — G9389 Other specified disorders of brain: Secondary | ICD-10-CM | POA: Insufficient documentation

## 2019-01-18 DIAGNOSIS — S79912A Unspecified injury of left hip, initial encounter: Secondary | ICD-10-CM | POA: Diagnosis not present

## 2019-01-18 DIAGNOSIS — E1122 Type 2 diabetes mellitus with diabetic chronic kidney disease: Secondary | ICD-10-CM | POA: Insufficient documentation

## 2019-01-18 DIAGNOSIS — Z8673 Personal history of transient ischemic attack (TIA), and cerebral infarction without residual deficits: Secondary | ICD-10-CM | POA: Insufficient documentation

## 2019-01-18 DIAGNOSIS — Z79899 Other long term (current) drug therapy: Secondary | ICD-10-CM | POA: Insufficient documentation

## 2019-01-18 DIAGNOSIS — R5381 Other malaise: Secondary | ICD-10-CM | POA: Diagnosis not present

## 2019-01-18 DIAGNOSIS — Z87891 Personal history of nicotine dependence: Secondary | ICD-10-CM | POA: Insufficient documentation

## 2019-01-18 DIAGNOSIS — S79911A Unspecified injury of right hip, initial encounter: Secondary | ICD-10-CM | POA: Diagnosis not present

## 2019-01-18 DIAGNOSIS — W19XXXA Unspecified fall, initial encounter: Secondary | ICD-10-CM | POA: Diagnosis not present

## 2019-01-18 DIAGNOSIS — M1712 Unilateral primary osteoarthritis, left knee: Secondary | ICD-10-CM | POA: Insufficient documentation

## 2019-01-18 DIAGNOSIS — S99912A Unspecified injury of left ankle, initial encounter: Secondary | ICD-10-CM | POA: Diagnosis not present

## 2019-01-18 DIAGNOSIS — M25551 Pain in right hip: Secondary | ICD-10-CM | POA: Diagnosis not present

## 2019-01-18 DIAGNOSIS — M25572 Pain in left ankle and joints of left foot: Secondary | ICD-10-CM | POA: Diagnosis not present

## 2019-01-18 MED ORDER — HYDROCODONE-ACETAMINOPHEN 5-325 MG PO TABS
1.0000 | ORAL_TABLET | Freq: Once | ORAL | Status: AC
  Administered 2019-01-18: 1 via ORAL
  Filled 2019-01-18: qty 1

## 2019-01-18 NOTE — ED Notes (Signed)
Bed: WA03 Expected date:  Expected time:  Means of arrival:  Comments: Closed

## 2019-01-18 NOTE — Discharge Instructions (Addendum)
Follow-up with your doctor for the increase in fluid on the right side of your chest.  Return here for any trouble breathing

## 2019-01-18 NOTE — ED Triage Notes (Signed)
Per EMS-patient fell yesterday morning-found by daughter-was fine yesterday but woke up this am stating he has leg pain-patient has a history of dementia-daughter at bedside

## 2019-01-18 NOTE — ED Provider Notes (Signed)
English DEPT Provider Note   CSN: [id number] Arrival date & time: 01/18/19  1040     History   Chief Complaint No chief complaint on file.   HPI [name] is a 85 y.o. male.     The history is provided by a caregiver. The history is limited by the condition of the patient (non-verbal).    Pt was seen at 1125. Per pt's caregiver: States she heard pt fall approximately 1am yesterday. Pt was on the floor. Pt was able to "hobble back to bed" at that time. Pt's family states pt continues to "hobble around" (has a cane and walker) and has been crying out c/o pain LLE. Family does not believe pt had LOC/syncopal episode when he fell. Pt has hx of frequent falls. Pt himself will point to his left leg and say "oohh."    Past Medical History:  Diagnosis Date  . Arthritis   . CAD (coronary artery disease)    s/p DES to LAD 06/2013  . Depression   . Dyslipidemia   . Glaucoma   . Hyperglycemia 04/17/2014  . Hyperlipidemia   . Hypertension   . Stroke ([place])   . Type 2 diabetes mellitus [facility])     Patient Active Problem List   Diagnosis Date Noted  . Bilateral lower extremity edema 07/05/2017  . Pulmonary nodules 04/13/2017  . Centrilobular emphysema ([place]) 04/13/2017  . Calculus of gallbladder without cholecystitis without obstruction 04/13/2017  . Chronic cough 04/13/2017  . Adenoma of left adrenal gland 04/13/2017  . Frequent falls 04/01/2017  . Type 2 diabetes mellitus with stage 3 chronic kidney disease, with long-term current use of insulin ([place]) 04/01/2017  . History of tobacco abuse 04/01/2017  . Unsteady gait 04/01/2017  . Dysarthria 04/01/2017  . History of stroke   . Hyperlipidemia   . Moderate dementia with behavioral disturbance ([place]) 09/19/2015  . Depression 09/19/2015  . Hypertensive heart disease 09/10/2015  . Type 2 diabetes, controlled, with peripheral circulatory disorder ([place])   . Essential hypertension   . Coronary  artery disease involving native coronary artery of native heart with angina pectoris ([place])   . Arthritis   . Glaucoma     Past Surgical History:  Procedure Laterality Date  . CARDIAC CATHETERIZATION  06/2013   DES to LAD by Dr Tamala Julian  . Right Cataract          Home Medications    Prior to Admission medications   Medication Sig Start Date End Date Taking? Authorizing Provider  albuterol (PROVENTIL HFA;VENTOLIN HFA) 108 (90 Base) MCG/ACT inhaler INHALE TWO PUFFS EVERY 4-6 HOURS AS NEEDED FOR DIFFICULTY BREATHING,WHEEZING OR COUGH Patient taking differently: Inhale 2 puffs into the lungs every 4 (four) hours as needed for wheezing or shortness of breath (cough).  08/31/18  Yes Ngetich, Dinah C, NP  Alcohol Swabs (B-D SINGLE USE SWABS REGULAR) PADS USE AS DIRECTED WITH CHECKING BLOOD SUGAR THREE TIMES DAILY 09/07/17  Yes Eulas Post, Monica, DO  amLODipine (NORVASC) 5 MG tablet Take 1 tablet (5 mg total) by mouth daily. 08/31/18  Yes Ngetich, Dinah C, NP  aspirin EC 81 MG tablet Take 1 tablet (81 mg total) by mouth daily. 06/22/13  Yes Rowe Clack, MD  Blood Glucose Calibration (ACCU-CHEK AVIVA) SOLN Use as Directed. Dx: E11.51 04/20/16  Yes Carter, Monica, DO  escitalopram (LEXAPRO) 5 MG tablet TAKE 1 TABLET EVERY DAY Patient taking differently: Take 5 mg by mouth daily.  08/31/18  Yes Ngetich,  Dinah C, NP  glucose blood (ACCU-CHEK AVIVA) test strip Use to test blood sugar three times daily. Dx: E11.51 08/31/18  Yes Ngetich, Dinah C, NP  hydrochlorothiazide (HYDRODIURIL) 25 MG tablet Take 1 tablet (25 mg total) by mouth daily. 08/31/18  Yes Ngetich, Dinah C, NP  Insulin Glargine, 1 Unit Dial, (TOUJEO SOLOSTAR) 300 UNIT/ML SOPN Inject 65 Units into the skin daily. Patient taking differently: Inject 65 Units into the skin at bedtime.  11/06/18  Yes Ngetich, Dinah C, NP  Insulin Pen Needle (B-D UF III MINI PEN NEEDLES) 31G X 5 MM MISC USE AS DIRECTED. 08/31/18  Yes Ngetich, Dinah C, NP  Lancet  Devices (ACCU-CHEK SOFTCLIX) lancets Use as instructed with checking blood sugar three times daily. Dx: E11.51 04/01/17  Yes Gildardo Cranker, DO  Loratadine 10 MG CAPS Take 1 capsule (10 mg total) by mouth daily. 08/31/18  Yes Ngetich, Dinah C, NP  losartan (COZAAR) 50 MG tablet Take 1 tablet (50 mg total) by mouth daily. 08/31/18  Yes Ngetich, Dinah C, NP  Memantine HCl-Donepezil HCl (NAMZARIC) 28-10 MG CP24 Take 1 capsule by mouth daily. 08/31/18  Yes Ngetich, Dinah C, NP  mineral oil-hydrophilic petrolatum (AQUAPHOR) ointment Apply topically 2 (two) times daily. To legs Patient taking differently: Apply 1 application topically 2 (two) times daily. Applies to legs. 12/29/18  Yes Ngetich, Dinah C, NP  Multiple Vitamins-Minerals (SENTRY SENIOR) TABS Take 1 tablet by mouth daily.   Yes [provider]  simvastatin (ZOCOR) 20 MG tablet Take 1 tablet (20 mg total) by mouth daily. 08/31/18  Yes Ngetich, Dinah C, NP  torsemide (DEMADEX) 20 MG tablet Take 1 tablet (20 mg total) by mouth See admin instructions. For 5 days then take 1 by mouth daily as needed Patient taking differently: Take 20 mg by mouth daily.  12/29/18  Yes Ngetich, Dinah C, NP    Family History Family History  Problem Relation Age of Onset  . Arthritis Mother   . Breast cancer Mother   . Arthritis Father   . Dementia Father   . Diabetes Other   . Arthritis Other   . Colon cancer Other   . Stroke Other   . Diabetes type II Sister   . Suicidality Brother   . Diabetes type I Brother   . Diabetes type I Brother   . Diabetes type I Brother   . Diabetes type II Sister   . Breast cancer Daughter   . Emphysema Daughter     Social History Social History   Tobacco Use  . Smoking status: Former Smoker    Types: Cigarettes    Quit date: 08/02/1968    Years since quitting: 50.4  . Smokeless tobacco: Never Used  Substance Use Topics  . Alcohol use: No    Alcohol/week: 0.0 standard drinks  . Drug use: No     Allergies    Lisinopril   Review of Systems Review of Systems  Unable to perform ROS: Patient nonverbal     Physical Exam Updated Vital Signs BP 133/73   Pulse 71   Temp 98.5 F (36.9 C) (Oral)   Resp 16   Ht 5\' 11"  (1.803 m)   Wt 83 kg   SpO2 99%   BMI 25.52 kg/m     Physical Exam 1130: Physical examination:  Nursing notes reviewed; Vital signs and O2 SAT reviewed;  Constitutional: Well developed, Well nourished, Well hydrated, In no acute distress; Head:  Normocephalic, atraumatic; Eyes: EOMI, PERRL, No scleral  icterus; ENMT: Mouth and pharynx normal, Mucous membranes moist; Neck: Supple, Full range of motion, No lymphadenopathy; Cardiovascular: Regular rate and rhythm, No gallop; Respiratory: Breath sounds clear & equal bilaterally, No wheezes. Normal respiratory effort/excursion; Chest: Nontender, Movement normal; Abdomen: Soft, Nontender, Nondistended, Normal bowel sounds; Genitourinary: No CVA tenderness; Spine:  No midline CS, TS, LS tenderness.;;  Extremities: Peripheral pulses normal, Pelvis stable. +TTP left hip. NT left knee/ankle/foot when distracted. No deformity. No edema, No calf edema or asymmetry.; Neuro: Awake, alert. No facial droop. Speech per baseline.  Moves all extremities on stretcher spontaneously without apparent gross focal motor deficits in extremities.; Skin: Color normal, Warm, Dry.     ED Treatments / Results  Labs (all labs ordered are listed, but only abnormal results are displayed)   EKG    Radiology   Procedures Procedures (including critical care time)  Medications Ordered in ED Medications  HYDROcodone-acetaminophen (NORCO/VICODIN) 5-325 MG per tablet 1 tablet (1 tablet Oral Given 01/18/19 1410)     Initial Impression / Assessment and Plan / ED Course  I have reviewed the triage vital signs and the nursing notes.  Pertinent labs & imaging results that were available during my care of the patient were reviewed by me and considered in my  medical decision making (see chart for details).     MDM Reviewed: previous chart, nursing note and vitals Interpretation: x-ray     Dg Ankle Complete Left Result Date: 01/18/2019 CLINICAL DATA:  Acute LEFT ankle pain following fall. Initial encounter. EXAM: LEFT ANKLE COMPLETE - 3+ VIEW COMPARISON:  None. FINDINGS: There is no evidence of fracture, dislocation, or joint effusion. There is no evidence of arthropathy or other focal bone abnormality. Soft tissues are unremarkable. IMPRESSION: Negative. Electronically Signed   By: Margarette Canada M.D.   On: 01/18/2019 13:36   Ct Head Wo Contrast Result Date: 01/18/2019 CLINICAL DATA:  85 year old male with recent fall and head/neck injury. Patient with dementia. EXAM: CT HEAD WITHOUT CONTRAST CT CERVICAL SPINE WITHOUT CONTRAST TECHNIQUE: Multidetector CT imaging of the head and cervical spine was performed following the standard protocol without intravenous contrast. Multiplanar CT image reconstructions of the cervical spine were also generated. COMPARISON:  04/19/2018 chest CT and 12/05/2015 head and cervical spine CT FINDINGS: CT HEAD FINDINGS Brain: No evidence of acute infarction, hemorrhage, hydrocephalus, extra-axial collection or mass lesion/mass effect. Atrophy, chronic small-vessel white matter ischemic changes and INFERIOR LEFT frontal encephalomalacia again noted. Vascular: Carotid atherosclerotic calcifications again noted Skull: No acute abnormality Sinuses/Orbits: No acute abnormality. Opacified anterior RIGHT ethmoid air cells again noted. Other: None CT CERVICAL SPINE FINDINGS Alignment: No subluxation. Straightening of the normal cervical lordosis again noted. Skull base and vertebrae: No acute fracture. No primary bone lesion or focal pathologic process. Soft tissues and spinal canal: No prevertebral fluid or swelling. No visible canal hematoma. Disc levels: Multilevel degenerative disc disease and spondylosis again noted. Upper chest:  Abnormal soft tissue within the UPPER RIGHT mediastinum/MEDIAL pleural surface noted, new since 09/20/2017. Other: None IMPRESSION: 1. No evidence of acute intracranial abnormality. Atrophy, chronic small-vessel white matter ischemic changes and INFERIOR LEFT frontal encephalomalacia. 2. No static evidence of acute injury to the cervical spine. Degenerative changes. 3. New abnormal soft tissue within the UPPER RIGHT mediastinum/MEDIAL pleural surface. Consider chest CT with contrast as clinically indicated. Electronically Signed   By: Margarette Canada M.D.   On: 01/18/2019 13:06   Ct Cervical Spine Wo Contrast Result Date: 01/18/2019 CLINICAL DATA:  85 year old male with  recent fall and head/neck injury. Patient with dementia. EXAM: CT HEAD WITHOUT CONTRAST CT CERVICAL SPINE WITHOUT CONTRAST TECHNIQUE: Multidetector CT imaging of the head and cervical spine was performed following the standard protocol without intravenous contrast. Multiplanar CT image reconstructions of the cervical spine were also generated. COMPARISON:  04/19/2018 chest CT and 12/05/2015 head and cervical spine CT FINDINGS: CT HEAD FINDINGS Brain: No evidence of acute infarction, hemorrhage, hydrocephalus, extra-axial collection or mass lesion/mass effect. Atrophy, chronic small-vessel white matter ischemic changes and INFERIOR LEFT frontal encephalomalacia again noted. Vascular: Carotid atherosclerotic calcifications again noted Skull: No acute abnormality Sinuses/Orbits: No acute abnormality. Opacified anterior RIGHT ethmoid air cells again noted. Other: None CT CERVICAL SPINE FINDINGS Alignment: No subluxation. Straightening of the normal cervical lordosis again noted. Skull base and vertebrae: No acute fracture. No primary bone lesion or focal pathologic process. Soft tissues and spinal canal: No prevertebral fluid or swelling. No visible canal hematoma. Disc levels: Multilevel degenerative disc disease and spondylosis again noted. Upper chest:  Abnormal soft tissue within the UPPER RIGHT mediastinum/MEDIAL pleural surface noted, new since 09/20/2017. Other: None IMPRESSION: 1. No evidence of acute intracranial abnormality. Atrophy, chronic small-vessel white matter ischemic changes and INFERIOR LEFT frontal encephalomalacia. 2. No static evidence of acute injury to the cervical spine. Degenerative changes. 3. New abnormal soft tissue within the UPPER RIGHT mediastinum/MEDIAL pleural surface. Consider chest CT with contrast as clinically indicated. Electronically Signed   By: Margarette Canada M.D.   On: 01/18/2019 13:06   Dg Knee Complete 4 Views Left Result Date: 01/18/2019 CLINICAL DATA:  Acute LEFT knee pain following fall. Initial encounter. EXAM: LEFT KNEE - COMPLETE 4+ VIEW COMPARISON:  None. FINDINGS: Tricompartmental degenerative changes are noted. Chondrocalcinosis identified. There is no evidence of acute fracture, subluxation or dislocation. No joint effusion. IMPRESSION: No acute abnormality. Tricompartmental degenerative changes and chondrocalcinosis. Electronically Signed   By: Margarette Canada M.D.   On: 01/18/2019 13:38   Dg Foot Complete Left Result Date: 01/18/2019 CLINICAL DATA:  Fall with LEFT foot pain.  Initial encounter. EXAM: LEFT FOOT - COMPLETE 3+ VIEW COMPARISON:  None. FINDINGS: No acute fracture, subluxation or dislocation. Vascular calcifications are present. The Lisfranc joints are intact. No focal bony lesions. IMPRESSION: No acute bony abnormality. Electronically Signed   By: Margarette Canada M.D.   On: 01/18/2019 13:35   Dg Hips Bilat W Or Wo Pelvis 5 Views Result Date: 01/18/2019 CLINICAL DATA:  Fall with bilateral hip pain.  Initial encounter. EXAM: DG HIP (WITH OR WITHOUT PELVIS) 5+V BILAT COMPARISON:  None. FINDINGS: No acute fracture, subluxation or dislocation. Probable AVN changes within both femoral heads noted. There is no evidence of femoral head flattening or fracture. IMPRESSION: 1. No acute abnormality 2. Probable  bilateral femoral head AVN without head flattening or fracture. Electronically Signed   By: Margarette Canada M.D.   On: 01/18/2019 13:34     1515:  Pt given pain meds, continues to cry out when left hip touched: CT pending. If negative, pt will need to ambulate per baseline. Sign out to Dr. Zenia Resides.     Final Clinical Impressions(s) / ED Diagnoses   Final diagnoses:  None    ED Discharge Orders    None       Francine Graven, DO 01/18/19 1519

## 2019-01-18 NOTE — ED Notes (Signed)
Pt returned from radiology. Family member is at bedside.

## 2019-01-18 NOTE — ED Provider Notes (Addendum)
Patient sent to me by Dr. Thurnell Garbe.  Patient hip CT without acute findings.  Patient able to range his lower extremities without any difficulty.  Does use a cane and has a walker at home.  CT of the chest shows an increase in the patient's pleural effusion but according to the patient's caregiver patient has not been short of breath.  And she states that patient will follow-up with his doctor for this.  She requested take him home at this time.   Lacretia Leigh, MD 01/18/19 1659    Lacretia Leigh, MD 01/18/19 1700

## 2019-01-18 NOTE — ED Notes (Signed)
Patient stood up with assistance and took two steps

## 2019-01-19 ENCOUNTER — Telehealth: Payer: Self-pay | Admitting: Family

## 2019-01-19 NOTE — Telephone Encounter (Signed)
Call patient to follow up for ED visit 01/18/2019 CT of the chest showed pleural effusion.

## 2019-01-22 ENCOUNTER — Other Ambulatory Visit: Payer: Self-pay | Admitting: *Deleted

## 2019-01-22 ENCOUNTER — Other Ambulatory Visit: Payer: Self-pay

## 2019-01-22 ENCOUNTER — Encounter: Payer: Self-pay | Admitting: Family

## 2019-01-22 ENCOUNTER — Ambulatory Visit (INDEPENDENT_AMBULATORY_CARE_PROVIDER_SITE_OTHER): Payer: Medicare HMO | Admitting: Family

## 2019-01-22 VITALS — BP 130/58 | HR 82 | Temp 97.9°F | Resp 20 | Wt 175.8 lb

## 2019-01-22 DIAGNOSIS — R918 Other nonspecific abnormal finding of lung field: Secondary | ICD-10-CM

## 2019-01-22 DIAGNOSIS — L570 Actinic keratosis: Secondary | ICD-10-CM

## 2019-01-22 DIAGNOSIS — J90 Pleural effusion, not elsewhere classified: Secondary | ICD-10-CM

## 2019-01-22 DIAGNOSIS — R2681 Unsteadiness on feet: Secondary | ICD-10-CM

## 2019-01-22 DIAGNOSIS — Y92009 Unspecified place in unspecified non-institutional (private) residence as the place of occurrence of the external cause: Secondary | ICD-10-CM

## 2019-01-22 DIAGNOSIS — W19XXXD Unspecified fall, subsequent encounter: Secondary | ICD-10-CM | POA: Diagnosis not present

## 2019-01-22 MED ORDER — TORSEMIDE 20 MG PO TABS: 20.0000 mg | ORAL_TABLET | Freq: Every day | ORAL | 0 refills | Status: DC | PRN

## 2019-01-22 MED ORDER — AQUAPHOR EX OINT: TOPICAL_OINTMENT | Freq: Two times a day (BID) | CUTANEOUS | 0 refills | Status: DC

## 2019-01-22 NOTE — Telephone Encounter (Signed)
Okay; thanks.

## 2019-01-22 NOTE — Telephone Encounter (Signed)
[facility] 

## 2019-01-22 NOTE — Progress Notes (Addendum)
Provider: Marlowe Sax FNP-C  Jeromy Borcherding, Nelda Bucks, NP  Patient Care Team: Heidi Lemay, Nelda Bucks, NP as PCP - General (Family Medicine) Macarthur Critchley, [place] as Referring Physician (Optometry) Delice Lesch Lezlie Octave, MD as Consulting Physician (Neurology)  Extended Emergency Contact Information Primary Emergency Contact: [facility] Address: [address]          [place], [place] [address] [name] of [place] Phone: [phone] Relation: Daughter Secondary Emergency Contact: [name] [place] of [place] Phone: [phone] Mobile Phone: [phone] Relation: Other  Goals of care: Advanced Directive information Advanced Directives 01/22/2019  Does Patient Have a Medical Advance Directive? Yes  Type of Paramedic of [place];Living will  Does patient want to make changes to medical advance directive? No - Guardian declined  Copy of [place] in Chart? No - copy requested  Would patient like information on creating a medical advance directive? -     Chief Complaint  Patient presents with   Hospitalization Follow-up    Hospitalization Follow UP    HPI:  Pt is a 85 y.o. male seen today for an acute visit for hospital follow up for fall.He is here today with daughter who provides HPI information.He was in the ED 6/18 with complains of left hip pain post fall episode in the bathroom at home.He had hip, knee,ankle and foot X-ray that showed no fractures.Also had a CT scan of the head ,cervical spine and chest.His CT scan of the chest showed Multiple lung nodules on the right, most pleural based, without apparent change from the prior CT scan.also showed  Moderate right-sided pleural effusion increased in size from prior study with  mild linear opacity at the left lung base consistent with atelectasis and similar to the prior chest CT.Patient's daughter request follow up with a specialist for further evaluation.He has a significant  medical history of Prostate mass  He denies any acute issues this visit.Daughter states patient's previous cough has improved with mucinex daily previously using mucinex twice daily.she states swelling on his legs has also improved no shortness of breath.    Past Medical History:  Diagnosis Date   Arthritis    CAD (coronary artery disease)    s/p DES to LAD 06/2013   Depression    Dyslipidemia    Glaucoma    Hyperglycemia 04/17/2014   Hyperlipidemia    Hypertension    Stroke ([place])    Type 2 diabetes mellitus ([place])    Past Surgical History:  Procedure Laterality Date   CARDIAC CATHETERIZATION  06/2013   DES to LAD by Dr Tamala Julian   Right Cataract      Allergies  Allergen Reactions   Lisinopril Cough    Outpatient Encounter Medications as of 01/22/2019  Medication Sig   albuterol (PROVENTIL HFA;VENTOLIN HFA) 108 (90 Base) MCG/ACT inhaler INHALE TWO PUFFS EVERY 4-6 HOURS AS NEEDED FOR DIFFICULTY BREATHING,WHEEZING OR COUGH   Alcohol Swabs (B-D SINGLE USE SWABS REGULAR) PADS USE AS DIRECTED WITH CHECKING BLOOD SUGAR THREE TIMES DAILY   amLODipine (NORVASC) 5 MG tablet Take 1 tablet (5 mg total) by mouth daily.   aspirin EC 81 MG tablet Take 1 tablet (81 mg total) by mouth daily.   Blood Glucose Calibration (ACCU-CHEK AVIVA) SOLN Use as Directed. Dx: E11.51   escitalopram (LEXAPRO) 5 MG tablet TAKE 1 TABLET EVERY DAY   glucose blood (ACCU-CHEK AVIVA) test strip Use to test blood sugar three times daily. Dx: E11.51   hydrochlorothiazide (HYDRODIURIL) 25 MG tablet  Take 1 tablet (25 mg total) by mouth daily.   Insulin Glargine, 1 Unit Dial, (TOUJEO SOLOSTAR) 300 UNIT/ML SOPN Inject 65 Units into the skin daily.   Insulin Pen Needle (B-D UF III MINI PEN NEEDLES) 31G X 5 MM MISC USE AS DIRECTED.   Lancet Devices (ACCU-CHEK SOFTCLIX) lancets Use as instructed with checking blood sugar three times daily. Dx: E11.51   Loratadine 10 MG CAPS Take 1 capsule (10 mg total)  by mouth daily.   losartan (COZAAR) 50 MG tablet Take 1 tablet (50 mg total) by mouth daily.   Memantine HCl-Donepezil HCl (NAMZARIC) 28-10 MG CP24 Take 1 capsule by mouth daily.   mineral oil-hydrophilic petrolatum (AQUAPHOR) ointment Apply topically 2 (two) times daily. To legs   Multiple Vitamins-Minerals (SENTRY SENIOR) TABS Take 1 tablet by mouth daily.   simvastatin (ZOCOR) 20 MG tablet Take 1 tablet (20 mg total) by mouth daily.   torsemide (DEMADEX) 20 MG tablet Take 1 tablet (20 mg total) by mouth daily as needed.   [DISCONTINUED] mineral oil-hydrophilic petrolatum (AQUAPHOR) ointment Apply topically 2 (two) times daily. To legs   No facility-administered encounter medications on file as of 01/22/2019.     Review of Systems  Reason unable to perform ROS: additional information provided by daughter present during visit   Constitutional: Negative for appetite change, chills, fatigue and fever.  HENT: Negative for congestion, rhinorrhea, sinus pressure, sinus pain, sneezing and sore throat.   Eyes: Negative for pain, discharge, redness and itching.  Respiratory: Negative for cough, chest tightness, shortness of breath and wheezing.   Cardiovascular: Negative for chest pain and palpitations.       Swelling on legs has improved.   Gastrointestinal: Negative for abdominal distention, abdominal pain, constipation, diarrhea, nausea and vomiting.  Endocrine: Negative for cold intolerance, heat intolerance, polydipsia, polyphagia and polyuria.  Genitourinary: Negative for decreased urine volume, difficulty urinating, dysuria, flank pain and frequency.  Musculoskeletal: Positive for gait problem.  Skin: Negative for color change, pallor, rash and wound.  Neurological: Negative for dizziness, light-headedness and headaches.  Hematological: Does not bruise/bleed easily.  Psychiatric/Behavioral: Positive for confusion. Negative for agitation and sleep disturbance. The patient is not  nervous/anxious.     Immunization History  Administered Date(s) Administered   Influenza, High Dose Seasonal PF 06/22/2013, 05/30/2015   Influenza,inj,Quad PF,6+ Mos 04/19/2014, 04/02/2016, 04/01/2017, 04/26/2018   Pneumococcal Conjugate-13 07/25/2014   Pneumococcal Polysaccharide-23 08/03/2011, 06/22/2013   Td 10/22/2013   Tdap 07/30/2015   Zoster Recombinat (Shingrix) 05/20/2017, 07/20/2017   Pertinent  Health Maintenance Due  Topic Date Due   OPHTHALMOLOGY EXAM  06/14/2018   FOOT EXAM  10/05/2018   INFLUENZA VACCINE  03/03/2019   HEMOGLOBIN A1C  06/03/2019   PNA vac Low Risk Adult  Completed   Fall Risk  01/22/2019 01/02/2019 12/29/2018 12/01/2018 08/31/2018  Falls in the past year? 1 0 1 0 1  Number falls in past yr: 0 0 0 0 0  Injury with Fall? 0 0 0 0 0  Comment - - - - -  Risk Factor Category  - - - - -    Vitals:   01/22/19 1430  BP: (!) 130/58  Pulse: 82  Resp: 20  Temp: 97.9 F (36.6 C)  TempSrc: Oral  SpO2: 90%  Weight: 175 lb 12.8 oz (79.7 kg)   Body mass index is 24.52 kg/m. Physical Exam Constitutional:      General: He is not in acute distress.    Appearance: He is  normal weight. He is not ill-appearing.  HENT:     Nose: No congestion or rhinorrhea.     Mouth/Throat:     Mouth: Mucous membranes are moist.     Pharynx: Oropharynx is clear. No oropharyngeal exudate or posterior oropharyngeal erythema.  Eyes:     General: No scleral icterus.       Right eye: No discharge.        Left eye: No discharge.     Conjunctiva/sclera: Conjunctivae normal.     Pupils: Pupils are equal, round, and reactive to light.  Neck:     Musculoskeletal: Normal range of motion. No neck rigidity or muscular tenderness.  Cardiovascular:     Rate and Rhythm: Normal rate and regular rhythm.     Pulses: Normal pulses.     Heart sounds: Normal heart sounds. No murmur. No friction rub. No gallop.   Pulmonary:     Effort: Pulmonary effort is normal. No respiratory  distress.     Breath sounds: Normal breath sounds. No wheezing, rhonchi or rales.  Chest:     Chest wall: No tenderness.  Abdominal:     General: Bowel sounds are normal. There is no distension.     Palpations: There is no mass.     Tenderness: There is no abdominal tenderness. There is no right CVA tenderness, left CVA tenderness, guarding or rebound.  Musculoskeletal:        General: No swelling or tenderness.     Comments: Unsteady gait ambulates with cane.bilateral lower extremities trace edema.  Lymphadenopathy:     Cervical: No cervical adenopathy.  Skin:    General: Skin is warm and dry.     Coloration: Skin is not pale.     Findings: Lesion present. No bruising, erythema or rash.     Comments: Dark- grayish in color,> 6 mm with rough texture skin lesion noted on both upper back.   Neurological:     Mental Status: He is alert. Mental status is at baseline.     Cranial Nerves: No cranial nerve deficit.     Sensory: No sensory deficit.     Gait: Gait abnormal.  Psychiatric:        Mood and Affect: Mood normal.        Speech: Speech normal.        Behavior: Behavior is cooperative.        Thought Content: Thought content normal.        Cognition and Memory: Memory is impaired.        Judgment: Judgment normal.    Labs reviewed: Recent Labs    04/26/18 0827 08/31/18 1034 12/01/18 1032  NA 140 145 141  K 4.2 3.9 4.3  CL 100 103 100  CO2 31 35* 35*  GLUCOSE 321* 82 42*  BUN 19 16 19   CREATININE 1.45* 1.40* 1.63*  CALCIUM 9.6 9.8 9.9   Recent Labs    04/26/18 0827 08/31/18 1034 12/01/18 1032  AST 27 31 36*  ALT 19 22 32  BILITOT 0.3 0.5 0.5  PROT 6.4 6.7 7.1   Recent Labs    07/19/18 0922 08/31/18 1034 12/01/18 1032  WBC 4.9 4.7 6.5  NEUTROABS 1,955 2,247 4,024  HGB 11.6* 11.8* 11.6*  HCT 34.3* 35.2* 34.1*  MCV 92.7 92.9 93.2  PLT 187 180 200   Lab Results  Component Value Date   TSH 1.46 08/31/2018   Lab Results  Component Value Date    HGBA1C 6.3 (H) 12/01/2018  Lab Results  Component Value Date   CHOL 147 12/01/2018   HDL 37 (L) 12/01/2018   LDLCALC 89 12/01/2018   LDLDIRECT 92.0 11/24/2015   TRIG 117 12/01/2018   CHOLHDL 4.0 12/01/2018    Significant Diagnostic Results in last 30 days:  Dg Chest 2 View  Result Date: 12/29/2018 CLINICAL DATA:  Productive cough, congestion EXAM: CHEST - 2 VIEW COMPARISON:  07/18/2018 FINDINGS: Small to moderate right pleural effusion. Right base atelectasis. No effusions or focal opacity on the left. Heart is normal size. No acute bony abnormality. IMPRESSION: Small to moderate right pleural effusion with right base atelectasis. Electronically Signed   By: Rolm Baptise M.D.   On: 12/29/2018 11:57   Dg Ankle Complete Left  Result Date: 01/18/2019 CLINICAL DATA:  Acute LEFT ankle pain following fall. Initial encounter. EXAM: LEFT ANKLE COMPLETE - 3+ VIEW COMPARISON:  None. FINDINGS: There is no evidence of fracture, dislocation, or joint effusion. There is no evidence of arthropathy or other focal bone abnormality. Soft tissues are unremarkable. IMPRESSION: Negative. Electronically Signed   By: Margarette Canada M.D.   On: 01/18/2019 13:36   Ct Head Wo Contrast  Result Date: 01/18/2019 CLINICAL DATA:  85 year old male with recent fall and head/neck injury. Patient with dementia. EXAM: CT HEAD WITHOUT CONTRAST CT CERVICAL SPINE WITHOUT CONTRAST TECHNIQUE: Multidetector CT imaging of the head and cervical spine was performed following the standard protocol without intravenous contrast. Multiplanar CT image reconstructions of the cervical spine were also generated. COMPARISON:  04/19/2018 chest CT and 12/05/2015 head and cervical spine CT FINDINGS: CT HEAD FINDINGS Brain: No evidence of acute infarction, hemorrhage, hydrocephalus, extra-axial collection or mass lesion/mass effect. Atrophy, chronic small-vessel white matter ischemic changes and INFERIOR LEFT frontal encephalomalacia again noted.  Vascular: Carotid atherosclerotic calcifications again noted Skull: No acute abnormality Sinuses/Orbits: No acute abnormality. Opacified anterior RIGHT ethmoid air cells again noted. Other: None CT CERVICAL SPINE FINDINGS Alignment: No subluxation. Straightening of the normal cervical lordosis again noted. Skull base and vertebrae: No acute fracture. No primary bone lesion or focal pathologic process. Soft tissues and spinal canal: No prevertebral fluid or swelling. No visible canal hematoma. Disc levels: Multilevel degenerative disc disease and spondylosis again noted. Upper chest: Abnormal soft tissue within the UPPER RIGHT mediastinum/MEDIAL pleural surface noted, new since 09/20/2017. Other: None IMPRESSION: 1. No evidence of acute intracranial abnormality. Atrophy, chronic small-vessel white matter ischemic changes and INFERIOR LEFT frontal encephalomalacia. 2. No static evidence of acute injury to the cervical spine. Degenerative changes. 3. New abnormal soft tissue within the UPPER RIGHT mediastinum/MEDIAL pleural surface. Consider chest CT with contrast as clinically indicated. Electronically Signed   By: Margarette Canada M.D.   On: 01/18/2019 13:06   Ct Chest Wo Contrast  Result Date: 01/18/2019 CLINICAL DATA:  Fall on 6/17 at 0100 am/ patient c/o pain to LT hip/ able to "hobble" around but can't actually walk without help. Unable to get much else out of patient as his dementia is pretty bad and all he does is cry about his hip hurting. EXAM: CT CHEST WITHOUT CONTRAST TECHNIQUE: Multidetector CT imaging of the chest was performed following the standard protocol without IV contrast. COMPARISON:  Chest radiographs, 12/29/2018. Chest CT, 04/19/2018, 08/04/2017 and 04/06/2017. PET-CT, 09/20/2017. FINDINGS: Cardiovascular: Heart is normal in size. Three-vessel coronary artery calcifications. Left coronary artery stent. No pericardial effusion. Great vessels are normal in caliber. There are aortic atherosclerotic  calcifications. Mediastinum/Nodes: No neck base, axillary, mediastinal or hilar masses or enlarged  lymph nodes. Thyroid is unremarkable. Trachea is widely patent. Normal esophagus. Lungs/Pleura: There are multiple nodules along the fissures on the right. Largest lies along the minor fissure measuring 15 x 6 x 14 mm. Numerous others extending along the oblique fissure. Others lie along the pleural margins of the right middle and lower lobes. Several small parenchymal nodules noted, largest right upper lobe, image 49, series 2, 4 mm. There is dependent opacity in the right lower lobe adjacent to pleural effusion consistent with atelectasis. Milder opacity noted in the left lower lobe and inferior margin of the lingula also consistent with atelectasis. No left pleural effusion.  No pneumothorax. Upper Abdomen: Stable small low-density lesion left lobe of the liver consistent with a cyst. No acute findings in the upper abdomen. Musculoskeletal: No fracture or acute finding. No osteoblastic or osteolytic lesions. IMPRESSION: 1. Multiple lung nodules on the right, most pleural based, without apparent change from the prior CT scan. 2. Moderate right-sided pleural effusion has increased in size from prior study, now associated dependent right lower lobe opacity consistent with atelectasis. 3. Mild linear opacity at the left lung base is present consistent with atelectasis and similar to the prior chest CT. 4. No pulmonary edema. 5. Dense coronary artery calcifications.  Aortic atherosclerosis. Aortic Atherosclerosis (ICD10-I70.0). Electronically Signed   By: Lajean Manes M.D.   On: 01/18/2019 16:13   Ct Cervical Spine Wo Contrast  Result Date: 01/18/2019 CLINICAL DATA:  85 year old male with recent fall and head/neck injury. Patient with dementia. EXAM: CT HEAD WITHOUT CONTRAST CT CERVICAL SPINE WITHOUT CONTRAST TECHNIQUE: Multidetector CT imaging of the head and cervical spine was performed following the standard  protocol without intravenous contrast. Multiplanar CT image reconstructions of the cervical spine were also generated. COMPARISON:  04/19/2018 chest CT and 12/05/2015 head and cervical spine CT FINDINGS: CT HEAD FINDINGS Brain: No evidence of acute infarction, hemorrhage, hydrocephalus, extra-axial collection or mass lesion/mass effect. Atrophy, chronic small-vessel white matter ischemic changes and INFERIOR LEFT frontal encephalomalacia again noted. Vascular: Carotid atherosclerotic calcifications again noted Skull: No acute abnormality Sinuses/Orbits: No acute abnormality. Opacified anterior RIGHT ethmoid air cells again noted. Other: None CT CERVICAL SPINE FINDINGS Alignment: No subluxation. Straightening of the normal cervical lordosis again noted. Skull base and vertebrae: No acute fracture. No primary bone lesion or focal pathologic process. Soft tissues and spinal canal: No prevertebral fluid or swelling. No visible canal hematoma. Disc levels: Multilevel degenerative disc disease and spondylosis again noted. Upper chest: Abnormal soft tissue within the UPPER RIGHT mediastinum/MEDIAL pleural surface noted, new since 09/20/2017. Other: None IMPRESSION: 1. No evidence of acute intracranial abnormality. Atrophy, chronic small-vessel white matter ischemic changes and INFERIOR LEFT frontal encephalomalacia. 2. No static evidence of acute injury to the cervical spine. Degenerative changes. 3. New abnormal soft tissue within the UPPER RIGHT mediastinum/MEDIAL pleural surface. Consider chest CT with contrast as clinically indicated. Electronically Signed   By: Margarette Canada M.D.   On: 01/18/2019 13:06   Ct Hip Left Wo Contrast  Result Date: 01/18/2019 CLINICAL DATA:  Left hip pain since a fall 01/17/2019 at 1 a.m. Cause of the fall is unknown in this patient with dementia. Initial encounter. EXAM: CT OF THE LEFT HIP WITHOUT CONTRAST TECHNIQUE: Multidetector CT imaging of the left hip was performed according to the  standard protocol. Multiplanar CT image reconstructions were also generated. COMPARISON:  Plain films of the hips 01/18/2019. FINDINGS: Bones/Joint/Cartilage There is no acute bony or joint abnormality. Extensive avascular necrosis of the left  femoral head is seen without fragmentation or collapse. Joint space is preserved. There is some chondrocalcinosis about the joint. Small joint effusion is likely incidental. Ligaments Suboptimally assessed by CT. Muscles and Tendons Intact. Calcifications in the left hamstrings at their origin could be due to chondrocalcinosis or remote tendinopathy. Soft tissues Imaged intrapelvic contents demonstrate prostatomegaly. Atherosclerosis also noted. IMPRESSION: No acute abnormality. Avascular necrosis of the femoral head without fragmentation or collapse. Atherosclerosis. Electronically Signed   By: Inge Rise M.D.   On: 01/18/2019 15:54   Dg Knee Complete 4 Views Left  Result Date: 01/18/2019 CLINICAL DATA:  Acute LEFT knee pain following fall. Initial encounter. EXAM: LEFT KNEE - COMPLETE 4+ VIEW COMPARISON:  None. FINDINGS: Tricompartmental degenerative changes are noted. Chondrocalcinosis identified. There is no evidence of acute fracture, subluxation or dislocation. No joint effusion. IMPRESSION: No acute abnormality. Tricompartmental degenerative changes and chondrocalcinosis. Electronically Signed   By: Margarette Canada M.D.   On: 01/18/2019 13:38   Dg Foot Complete Left  Result Date: 01/18/2019 CLINICAL DATA:  Fall with LEFT foot pain.  Initial encounter. EXAM: LEFT FOOT - COMPLETE 3+ VIEW COMPARISON:  None. FINDINGS: No acute fracture, subluxation or dislocation. Vascular calcifications are present. The Lisfranc joints are intact. No focal bony lesions. IMPRESSION: No acute bony abnormality. Electronically Signed   By: Margarette Canada M.D.   On: 01/18/2019 13:35   Dg Hips Bilat W Or Wo Pelvis 5 Views  Result Date: 01/18/2019 CLINICAL DATA:  Fall with bilateral  hip pain.  Initial encounter. EXAM: DG HIP (WITH OR WITHOUT PELVIS) 5+V BILAT COMPARISON:  None. FINDINGS: No acute fracture, subluxation or dislocation. Probable AVN changes within both femoral heads noted. There is no evidence of femoral head flattening or fracture. IMPRESSION: 1. No acute abnormality 2. Probable bilateral femoral head AVN without head flattening or fracture. Electronically Signed   By: Margarette Canada M.D.   On: 01/18/2019 13:34  Assessment/Plan  1. Pulmonary nodules New abnormal soft tissue within the right upper mediastinum pleural surface on CT scan 01/18/2019.chronic cough with pleural effusion.oxygen saturation 90% on RA.No shortness of breath or wheezing noted.  - Ambulatory referral to Pulmonology  2. Pleural effusion Continue on torsemide 20 mg tablet daily as needed. - Ambulatory referral to Pulmonology  3. AK (actinic keratosis) Multiple dark-grayish in color > 6 mm ,rough texture lesion noted on bilateral upper back.  - Ambulatory referral to Dermatology for further evaluation.   4. Unsteady Gait  Gait stability worsening.uses a cane.has walker but per daughter patient refuses to use walker.status post fall 01/18/2019.Home health PT/OT to evaluate as indicated for gait stability,exericse,ROM and muscle strengthening.  5. Fall encounter [place] in the bathroom at home.Has had multiple falls per daughter.No acute injuries.X-rays in the ED negative.Home health PT/OT as above.    Family/ staff Communication: Reviewed plan of care with patient.   Labs/tests ordered: None   Next visit: 06/05/2019 for medical management of chronic issues.   Time spent with patient 25 minutes >50% time spent counseling; reviewing medical record; tests; labs; and developing future plan of care  Sandrea Hughs, NP

## 2019-01-22 NOTE — Telephone Encounter (Signed)
Spoke with daughter, [name] and she stated that she is going to check her calendar for this week and call us back to schedule an appointment.

## 2019-01-23 ENCOUNTER — Ambulatory Visit (INDEPENDENT_AMBULATORY_CARE_PROVIDER_SITE_OTHER): Payer: Medicare HMO | Admitting: Podiatry

## 2019-01-23 ENCOUNTER — Encounter: Payer: Self-pay | Admitting: Podiatry

## 2019-01-23 DIAGNOSIS — M79675 Pain in left toe(s): Secondary | ICD-10-CM

## 2019-01-23 DIAGNOSIS — B351 Tinea unguium: Secondary | ICD-10-CM | POA: Diagnosis not present

## 2019-01-23 DIAGNOSIS — E1151 Type 2 diabetes mellitus with diabetic peripheral angiopathy without gangrene: Secondary | ICD-10-CM | POA: Diagnosis not present

## 2019-01-23 DIAGNOSIS — M79674 Pain in right toe(s): Secondary | ICD-10-CM | POA: Diagnosis not present

## 2019-01-23 NOTE — Progress Notes (Signed)
Complaint:  Visit Type: Patient returns to my office for continued preventative foot care services. Complaint: Patient states" my nails have grown long and thick and become painful to walk and wear shoes" Patient has been diagnosed with DM with angiopathy. The patient presents for preventative foot care services. No changes to ROS  Podiatric Exam: Vascular: dorsalis pedis  are palpable bilateral. Posterior tibial pulses are not palpable  B/L Capillary return is immediate. Temperature gradient is WNL. Skin turgor WNL  Sensorium: Normal Semmes Weinstein monofilament test. Normal tactile sensation bilaterally. Nail Exam: Pt has thick disfigured discolored nails with subungual debris noted bilateral entire nail hallux through fifth toenails Ulcer Exam: There is no evidence of ulcer or pre-ulcerative changes or infection. Orthopedic Exam: Muscle tone and strength are WNL. No limitations in general ROM. No crepitus or effusions noted. Hammer toes second  B/L.   Bony prominences are unremarkable. Skin: No Porokeratosis. No infection or ulcers  Diagnosis:  Onychomycosis, , Pain in right toe, pain in left toes  Treatment & Plan Procedures and Treatment: Consent by patient was obtained for treatment procedures.   Debridement of mycotic and hypertrophic toenails, 1 through 5 bilateral and clearing of subungual debris. No ulceration, no infection noted.  Return Visit-Office Procedure: Patient instructed to return to the office for a follow up visit 3 months for continued evaluation and treatment.    Gardiner Barefoot DPM

## 2019-01-25 ENCOUNTER — Telehealth: Payer: Self-pay | Admitting: Physical Therapy

## 2019-01-25 NOTE — Telephone Encounter (Signed)
LVM  informing of current processes in place at [facility] to protect patients from Covid-19 exposure including social distancing, schedule modifications, and new cleaning procedures. Noted the patients personal risk factors regarding COVID-19 and would like to provide the option of Telehealth for PT treatment since they are at a high potential risk. If the pt opted to still be seen face to face that can be done as well. If they could call us back and let us know how they would like to proceed with physical therapy.

## 2019-01-28 ENCOUNTER — Other Ambulatory Visit: Payer: Self-pay | Admitting: Family

## 2019-01-29 NOTE — Telephone Encounter (Signed)
Medication was only supposed to be used for 14 days per instructions, is this ok to fill?

## 2019-02-13 ENCOUNTER — Encounter: Payer: Self-pay | Admitting: Physical Therapy

## 2019-02-13 ENCOUNTER — Other Ambulatory Visit: Payer: Self-pay | Admitting: Family

## 2019-02-13 ENCOUNTER — Ambulatory Visit: Payer: Medicare HMO | Attending: Family | Admitting: Physical Therapy

## 2019-02-13 DIAGNOSIS — F03B18 Unspecified dementia, moderate, with other behavioral disturbance: Secondary | ICD-10-CM

## 2019-02-13 DIAGNOSIS — F0391 Unspecified dementia with behavioral disturbance: Secondary | ICD-10-CM

## 2019-02-13 DIAGNOSIS — R2681 Unsteadiness on feet: Secondary | ICD-10-CM | POA: Diagnosis not present

## 2019-02-13 DIAGNOSIS — F32A Depression, unspecified: Secondary | ICD-10-CM

## 2019-02-13 DIAGNOSIS — F329 Major depressive disorder, single episode, unspecified: Secondary | ICD-10-CM

## 2019-02-13 DIAGNOSIS — R29818 Other symptoms and signs involving the nervous system: Secondary | ICD-10-CM | POA: Insufficient documentation

## 2019-02-13 DIAGNOSIS — R2689 Other abnormalities of gait and mobility: Secondary | ICD-10-CM | POA: Insufficient documentation

## 2019-02-13 MED ORDER — LORATADINE 10 MG PO CAPS: 1.0000 | ORAL_CAPSULE | Freq: Every day | ORAL | 1 refills | Status: DC

## 2019-02-13 NOTE — Therapy (Signed)
[place], [place], [address] Phone: [phone]   Fax:  [phone]  Physical Therapy Evaluation  Patient Details  Name: [name] MRN: [id number] Date of Birth: [birth year] Referring Provider (PT): Marlowe Sax, NP   Encounter Date: 02/13/2019  PT End of Session - 02/13/19 1610    Visit Number  1    Number of Visits  1    PT Start Time  1420    PT Stop Time  1500    PT Time Calculation (min)  40 min       Past Medical History:  Diagnosis Date  . Arthritis   . CAD (coronary artery disease)    s/p DES to LAD 06/2013  . Depression   . Dyslipidemia   . Glaucoma   . Hyperglycemia 04/17/2014  . Hyperlipidemia   . Hypertension   . Stroke ([place])   . Type 2 diabetes mellitus ([place])     Past Surgical History:  Procedure Laterality Date  . CARDIAC CATHETERIZATION  06/2013   DES to LAD by Dr Tamala Julian  . Right Cataract      There were no vitals filed for this visit.   Subjective Assessment - 02/13/19 1435    Subjective  Patient was evaluated via Telehealth.  He has been seen prior at Neurorehabilitation for similar problem.  Hid daughter states his gait issues have worsened recently as has his dementia.  He is awake alot at night and fell about a monht ago in the bathroom.  He has no pain today.  He uses a cane at times but will not use a walker.  His daughter needs help keeping him safe and managing his care.    Pertinent History  dementia with behavioral disturbance, HTN, diabetes, depression. stroke, glaucoma    Currently in Pain?  No/denies         [facility] PT Assessment - 02/13/19 0001      Assessment   Medical Diagnosis  unsteady gait, falls    Referring Provider (PT)  Marlowe Sax, NP    Onset Date/Surgical Date  --   chronic    Prior Therapy  Yes , Neuro       Precautions   Precautions  Fall    Precaution Comments  dementia       Restrictions   Weight Bearing Restrictions  No      Balance Screen   Has the patient fallen in the past 6 months  Yes    How many times?  multiple, last one 6/22     Has the patient had a decrease in activity level because of a fear of falling?   Yes    Is the patient reluctant to leave their home because of a fear of falling?   Yes      [place] residence    Living Arrangements  Children    Type of [place] to enter    Entrance Stairs-Number of Steps  [place]  One level    [place] - single point;Shower seat      Prior Function   Level of Independence  Independent with household mobility with device    Vocation  Retired      Associate Professor   Overall Cognitive Status  History of cognitive impairments - at baseline      Observation/Other Assessments  Focus on Therapeutic Outcomes (FOTO)   NT      Observation/Other Assessments-Edema    Edema  --   bilateral LEs        Objective measurements completed on examination: See above findings.      PT Education - 02/13/19 1610    Education Details  PT, HHPT more appropriate or continuation via Neurorehab. CNA for respite and more assistance    Person(s) Educated  Patient    Methods  Explanation    Comprehension  Verbalized understanding          PT Long Term Goals - 09/29/18 1516      PT LONG TERM GOAL #1   Title  The patient will demonstrate walking with RW mod indep in clinic maintaining good positioning within device.  TARGET DATE 10/29/2018    Time  4    Period  Weeks    Target Date  10/29/18      PT LONG TERM GOAL #2   Title  The patient's daughter will verbalize understanding of home program [facility]) if patient able to perform.  If not, we will focus on home walking with supervision as exercise to maintain mobility.    Time  4    Period  Weeks    Target Date  10/29/18      PT LONG TERM GOAL #3   Title  The patient will move floor<>stand in the clinic (if he will participate) in  order to instruct his daughter in best way to perform floor transfers.    Time  4    Period  Weeks    Target Date  10/29/18             Plan - 02/13/19 1611    Clinical Impression Statement  Patient was seen via Telehealth but not appropriate at this time.  He cannot follow commands and daughter cannot safely guard him while managing the camera for visit. She does not want him in the community due to [place]. I feel that [place] is more appropriate.  She also was interested in possibly getting a CNA to help her take care of her father.  I feel this is very necessary as she cannot safely leave him to work or get any type of self care, appointments for her own mental health. He has a walker but has not used and per daughter he will not.    Personal Factors and Comorbidities  Age;Time since onset of injury/illness/exacerbation;Behavior Pattern;Comorbidity 1;Past/Current Experience    Comorbidities  multiple    Examination-Activity Limitations  Bathing;Hygiene/Grooming;Squat;Stairs;Bed Mobility;Bend;Locomotion Level;Stand;Reach Overhead;Toileting;Transfers;Dressing    Stability/Clinical Decision Making  Unstable/Unpredictable    Clinical Decision Making  Low    Clinical Presentation due to:  dementia, falls    Rehab Potential  Fair    PT Frequency  One time visit    PT Home Exercise Plan  general seated strength and flexibility emailed to daughter    Recommended Other Teacher, music, Education officer, museum, day program, HHPT    Consulted and Agree with Plan of Care  Patient;Family member/caregiver    Family Member Consulted  daughter/ main caregiver       Patient will benefit from skilled therapeutic intervention in order to improve the following deficits and impairments:  Abnormal gait, Decreased balance, Decreased safety awareness, Decreased activity tolerance  Visit Diagnosis: 1. Unsteadiness on feet   2. Other abnormalities of gait and mobility   3. Other symptoms and signs  involving the nervous system  Problem List Patient Active Problem List   Diagnosis Date Noted  . Pain due to onychomycosis of toenails of both feet 01/23/2019  . Bilateral lower extremity edema 07/05/2017  . Pulmonary nodules 04/13/2017  . Centrilobular emphysema ([place]) 04/13/2017  . Calculus of gallbladder without cholecystitis without obstruction 04/13/2017  . Chronic cough 04/13/2017  . Adenoma of left adrenal gland 04/13/2017  . Frequent falls 04/01/2017  . Type 2 diabetes mellitus with stage 3 chronic kidney disease, with long-term current use of insulin ([place]) 04/01/2017  . History of tobacco abuse 04/01/2017  . Unsteady gait 04/01/2017  . Dysarthria 04/01/2017  . History of stroke   . Hyperlipidemia   . Moderate dementia with behavioral disturbance ([place]) 09/19/2015  . Depression 09/19/2015  . Hypertensive heart disease 09/10/2015  . Type 2 diabetes, controlled, with peripheral circulatory disorder ([place])   . Essential hypertension   . Coronary artery disease involving native coronary artery of native heart with angina pectoris ([place])   . Arthritis   . Glaucoma     Latona Krichbaum 02/13/2019, 4:17 PM  [facility] [address] [place], [place], [address] Phone: [phone]   Fax:  [phone]  Name: [name] MRN: [id number] Date of Birth: 03/03/1922  Raeford Razor, PT 02/13/19 4:18 PM Phone: [phone] Fax: [phone]

## 2019-02-13 NOTE — Patient Instructions (Signed)
Access Code: [email or web address]  URL: [email or web address]  Date: 02/13/2019  Prepared by: Raeford Razor   Exercises  Seated Hamstring Stretch - 5 reps - 1 sets - 30 hold - 2x daily - 7x weekly  Seated March - 10 reps - 2 sets - 5 hold - 2x daily - 7x weekly  Seated Long Arc Quad - 10 reps - 2 sets - 5 hold - 2x daily - 7x weekly  Seated Ankle Pumps - 10 reps - 2 sets - 30 hold - 2x daily - 7x weekly  Supine Lower Trunk Rotation - 10 reps - 2 sets - 10 hold - 2x daily - 7x weekly

## 2019-02-14 ENCOUNTER — Telehealth: Payer: Self-pay | Admitting: *Deleted

## 2019-02-14 DIAGNOSIS — R2681 Unsteadiness on feet: Secondary | ICD-10-CM

## 2019-02-14 DIAGNOSIS — E1122 Type 2 diabetes mellitus with diabetic chronic kidney disease: Secondary | ICD-10-CM

## 2019-02-14 DIAGNOSIS — N183 Chronic kidney disease, stage 3 unspecified: Secondary | ICD-10-CM

## 2019-02-14 DIAGNOSIS — W19XXXD Unspecified fall, subsequent encounter: Secondary | ICD-10-CM

## 2019-02-14 NOTE — Telephone Encounter (Signed)
Anderson Malta, PT with [facility] called and stated that patient is not a good canidate for Tele a Health Visits. They are not comfortable going to the [facility]. Anderson Malta stated that the patient would benefit from [facility] and CNA. Please place referral if appropriate.

## 2019-02-14 NOTE — Telephone Encounter (Signed)
Order Home health Physical Therapy for unsteady gait,ROM,exercise and muscle strengthening.Nursing assistance to assist with ADL's.

## 2019-02-15 NOTE — Telephone Encounter (Signed)
Referral placed.

## 2019-02-20 DIAGNOSIS — N183 Chronic kidney disease, stage 3 (moderate): Secondary | ICD-10-CM | POA: Diagnosis not present

## 2019-02-20 DIAGNOSIS — R2681 Unsteadiness on feet: Secondary | ICD-10-CM

## 2019-02-20 DIAGNOSIS — I119 Hypertensive heart disease without heart failure: Secondary | ICD-10-CM | POA: Diagnosis not present

## 2019-02-20 DIAGNOSIS — F0390 Unspecified dementia without behavioral disturbance: Secondary | ICD-10-CM | POA: Diagnosis not present

## 2019-02-20 DIAGNOSIS — E1122 Type 2 diabetes mellitus with diabetic chronic kidney disease: Secondary | ICD-10-CM | POA: Diagnosis not present

## 2019-02-20 DIAGNOSIS — K8020 Calculus of gallbladder without cholecystitis without obstruction: Secondary | ICD-10-CM

## 2019-02-20 DIAGNOSIS — I2510 Atherosclerotic heart disease of native coronary artery without angina pectoris: Secondary | ICD-10-CM | POA: Diagnosis not present

## 2019-02-20 DIAGNOSIS — D3500 Benign neoplasm of unspecified adrenal gland: Secondary | ICD-10-CM

## 2019-02-20 DIAGNOSIS — J432 Centrilobular emphysema: Secondary | ICD-10-CM

## 2019-02-22 ENCOUNTER — Telehealth: Payer: Self-pay

## 2019-02-22 NOTE — Telephone Encounter (Signed)
May order Home health PT/OT for unsteady gait,falls ,Nurse assist for ADL's and social worker.

## 2019-02-22 NOTE — Telephone Encounter (Signed)
Flor from [facility] called and stated patient has fell again on the 21st. Stated she would like to request for  Patient to have Pt for 1 time weekly for 5 weeks, Evaluation for OT, and Evaluation for Education officer, museum. She also states that right now no home health aides are available to come in with the patient. She can be contacted at [phone].

## 2019-02-26 ENCOUNTER — Telehealth: Payer: Self-pay | Admitting: *Deleted

## 2019-02-26 NOTE — Telephone Encounter (Signed)
Flor with [facility] called requesting verbal orders for PT 1x5wks and OT and Copywriter, advertising.   Verbal orders given.

## 2019-03-01 DIAGNOSIS — F0390 Unspecified dementia without behavioral disturbance: Secondary | ICD-10-CM | POA: Diagnosis not present

## 2019-03-01 DIAGNOSIS — R2681 Unsteadiness on feet: Secondary | ICD-10-CM | POA: Diagnosis not present

## 2019-03-01 DIAGNOSIS — D3500 Benign neoplasm of unspecified adrenal gland: Secondary | ICD-10-CM | POA: Diagnosis not present

## 2019-03-01 DIAGNOSIS — J432 Centrilobular emphysema: Secondary | ICD-10-CM | POA: Diagnosis not present

## 2019-03-01 DIAGNOSIS — K8020 Calculus of gallbladder without cholecystitis without obstruction: Secondary | ICD-10-CM | POA: Diagnosis not present

## 2019-03-01 DIAGNOSIS — I2510 Atherosclerotic heart disease of native coronary artery without angina pectoris: Secondary | ICD-10-CM | POA: Diagnosis not present

## 2019-03-01 DIAGNOSIS — I119 Hypertensive heart disease without heart failure: Secondary | ICD-10-CM | POA: Diagnosis not present

## 2019-03-01 DIAGNOSIS — N183 Chronic kidney disease, stage 3 (moderate): Secondary | ICD-10-CM | POA: Diagnosis not present

## 2019-03-01 DIAGNOSIS — E1122 Type 2 diabetes mellitus with diabetic chronic kidney disease: Secondary | ICD-10-CM | POA: Diagnosis not present

## 2019-03-06 DIAGNOSIS — K8020 Calculus of gallbladder without cholecystitis without obstruction: Secondary | ICD-10-CM | POA: Diagnosis not present

## 2019-03-06 DIAGNOSIS — F0390 Unspecified dementia without behavioral disturbance: Secondary | ICD-10-CM | POA: Diagnosis not present

## 2019-03-06 DIAGNOSIS — D3500 Benign neoplasm of unspecified adrenal gland: Secondary | ICD-10-CM | POA: Diagnosis not present

## 2019-03-06 DIAGNOSIS — R2681 Unsteadiness on feet: Secondary | ICD-10-CM | POA: Diagnosis not present

## 2019-03-06 DIAGNOSIS — N183 Chronic kidney disease, stage 3 (moderate): Secondary | ICD-10-CM | POA: Diagnosis not present

## 2019-03-06 DIAGNOSIS — I2510 Atherosclerotic heart disease of native coronary artery without angina pectoris: Secondary | ICD-10-CM | POA: Diagnosis not present

## 2019-03-06 DIAGNOSIS — E1122 Type 2 diabetes mellitus with diabetic chronic kidney disease: Secondary | ICD-10-CM | POA: Diagnosis not present

## 2019-03-06 DIAGNOSIS — I119 Hypertensive heart disease without heart failure: Secondary | ICD-10-CM | POA: Diagnosis not present

## 2019-03-06 DIAGNOSIS — J432 Centrilobular emphysema: Secondary | ICD-10-CM | POA: Diagnosis not present

## 2019-03-07 ENCOUNTER — Telehealth: Payer: Self-pay | Admitting: Family

## 2019-03-07 DIAGNOSIS — J432 Centrilobular emphysema: Secondary | ICD-10-CM | POA: Diagnosis not present

## 2019-03-07 DIAGNOSIS — D3500 Benign neoplasm of unspecified adrenal gland: Secondary | ICD-10-CM | POA: Diagnosis not present

## 2019-03-07 DIAGNOSIS — I119 Hypertensive heart disease without heart failure: Secondary | ICD-10-CM | POA: Diagnosis not present

## 2019-03-07 DIAGNOSIS — N183 Chronic kidney disease, stage 3 (moderate): Secondary | ICD-10-CM | POA: Diagnosis not present

## 2019-03-07 DIAGNOSIS — F0390 Unspecified dementia without behavioral disturbance: Secondary | ICD-10-CM | POA: Diagnosis not present

## 2019-03-07 DIAGNOSIS — R2681 Unsteadiness on feet: Secondary | ICD-10-CM | POA: Diagnosis not present

## 2019-03-07 DIAGNOSIS — I2510 Atherosclerotic heart disease of native coronary artery without angina pectoris: Secondary | ICD-10-CM | POA: Diagnosis not present

## 2019-03-07 DIAGNOSIS — E1122 Type 2 diabetes mellitus with diabetic chronic kidney disease: Secondary | ICD-10-CM | POA: Diagnosis not present

## 2019-03-07 DIAGNOSIS — K8020 Calculus of gallbladder without cholecystitis without obstruction: Secondary | ICD-10-CM | POA: Diagnosis not present

## 2019-03-07 NOTE — Telephone Encounter (Signed)
[name] with [facility] called.  Dr. Mariea Clonts gave orders for PT and OT.  MSW saw patient on 03/06/19 and patient's daughter is concerned about edema in lower extremities and would like to add nursing care to services to patient's plan.

## 2019-03-07 NOTE — Telephone Encounter (Signed)
Called and gave Joycelyn Schmid at [facility] verbal order. Stated she would get this taken care of.

## 2019-03-07 NOTE — Telephone Encounter (Signed)
Home health Nurse to evaluate and treat as indicated.

## 2019-03-07 NOTE — Telephone Encounter (Signed)
Not Dr. Magdalene Molly patient

## 2019-03-14 DIAGNOSIS — N183 Chronic kidney disease, stage 3 (moderate): Secondary | ICD-10-CM | POA: Diagnosis not present

## 2019-03-14 DIAGNOSIS — E1122 Type 2 diabetes mellitus with diabetic chronic kidney disease: Secondary | ICD-10-CM | POA: Diagnosis not present

## 2019-03-14 DIAGNOSIS — D3500 Benign neoplasm of unspecified adrenal gland: Secondary | ICD-10-CM | POA: Diagnosis not present

## 2019-03-14 DIAGNOSIS — I2510 Atherosclerotic heart disease of native coronary artery without angina pectoris: Secondary | ICD-10-CM | POA: Diagnosis not present

## 2019-03-14 DIAGNOSIS — R2681 Unsteadiness on feet: Secondary | ICD-10-CM | POA: Diagnosis not present

## 2019-03-14 DIAGNOSIS — F0390 Unspecified dementia without behavioral disturbance: Secondary | ICD-10-CM | POA: Diagnosis not present

## 2019-03-14 DIAGNOSIS — K8020 Calculus of gallbladder without cholecystitis without obstruction: Secondary | ICD-10-CM | POA: Diagnosis not present

## 2019-03-14 DIAGNOSIS — J432 Centrilobular emphysema: Secondary | ICD-10-CM | POA: Diagnosis not present

## 2019-03-14 DIAGNOSIS — I119 Hypertensive heart disease without heart failure: Secondary | ICD-10-CM | POA: Diagnosis not present

## 2019-03-15 DIAGNOSIS — I119 Hypertensive heart disease without heart failure: Secondary | ICD-10-CM | POA: Diagnosis not present

## 2019-03-15 DIAGNOSIS — D3500 Benign neoplasm of unspecified adrenal gland: Secondary | ICD-10-CM | POA: Diagnosis not present

## 2019-03-15 DIAGNOSIS — I2510 Atherosclerotic heart disease of native coronary artery without angina pectoris: Secondary | ICD-10-CM | POA: Diagnosis not present

## 2019-03-15 DIAGNOSIS — R2681 Unsteadiness on feet: Secondary | ICD-10-CM | POA: Diagnosis not present

## 2019-03-15 DIAGNOSIS — J432 Centrilobular emphysema: Secondary | ICD-10-CM | POA: Diagnosis not present

## 2019-03-15 DIAGNOSIS — N183 Chronic kidney disease, stage 3 (moderate): Secondary | ICD-10-CM | POA: Diagnosis not present

## 2019-03-15 DIAGNOSIS — E1122 Type 2 diabetes mellitus with diabetic chronic kidney disease: Secondary | ICD-10-CM | POA: Diagnosis not present

## 2019-03-15 DIAGNOSIS — F0390 Unspecified dementia without behavioral disturbance: Secondary | ICD-10-CM | POA: Diagnosis not present

## 2019-03-15 DIAGNOSIS — K8020 Calculus of gallbladder without cholecystitis without obstruction: Secondary | ICD-10-CM | POA: Diagnosis not present

## 2019-03-21 DIAGNOSIS — F0390 Unspecified dementia without behavioral disturbance: Secondary | ICD-10-CM | POA: Diagnosis not present

## 2019-03-21 DIAGNOSIS — I119 Hypertensive heart disease without heart failure: Secondary | ICD-10-CM | POA: Diagnosis not present

## 2019-03-21 DIAGNOSIS — J432 Centrilobular emphysema: Secondary | ICD-10-CM | POA: Diagnosis not present

## 2019-03-21 DIAGNOSIS — N183 Chronic kidney disease, stage 3 (moderate): Secondary | ICD-10-CM | POA: Diagnosis not present

## 2019-03-21 DIAGNOSIS — I2510 Atherosclerotic heart disease of native coronary artery without angina pectoris: Secondary | ICD-10-CM | POA: Diagnosis not present

## 2019-03-21 DIAGNOSIS — E1122 Type 2 diabetes mellitus with diabetic chronic kidney disease: Secondary | ICD-10-CM | POA: Diagnosis not present

## 2019-03-21 DIAGNOSIS — K8020 Calculus of gallbladder without cholecystitis without obstruction: Secondary | ICD-10-CM | POA: Diagnosis not present

## 2019-03-21 DIAGNOSIS — D3500 Benign neoplasm of unspecified adrenal gland: Secondary | ICD-10-CM | POA: Diagnosis not present

## 2019-03-21 DIAGNOSIS — R2681 Unsteadiness on feet: Secondary | ICD-10-CM | POA: Diagnosis not present

## 2019-03-22 DIAGNOSIS — N183 Chronic kidney disease, stage 3 (moderate): Secondary | ICD-10-CM | POA: Diagnosis not present

## 2019-03-22 DIAGNOSIS — E1122 Type 2 diabetes mellitus with diabetic chronic kidney disease: Secondary | ICD-10-CM | POA: Diagnosis not present

## 2019-03-22 DIAGNOSIS — F0390 Unspecified dementia without behavioral disturbance: Secondary | ICD-10-CM | POA: Diagnosis not present

## 2019-03-22 DIAGNOSIS — I2510 Atherosclerotic heart disease of native coronary artery without angina pectoris: Secondary | ICD-10-CM | POA: Diagnosis not present

## 2019-03-22 DIAGNOSIS — I119 Hypertensive heart disease without heart failure: Secondary | ICD-10-CM | POA: Diagnosis not present

## 2019-03-22 DIAGNOSIS — J432 Centrilobular emphysema: Secondary | ICD-10-CM | POA: Diagnosis not present

## 2019-03-22 DIAGNOSIS — K8020 Calculus of gallbladder without cholecystitis without obstruction: Secondary | ICD-10-CM | POA: Diagnosis not present

## 2019-03-22 DIAGNOSIS — D3500 Benign neoplasm of unspecified adrenal gland: Secondary | ICD-10-CM | POA: Diagnosis not present

## 2019-03-22 DIAGNOSIS — R2681 Unsteadiness on feet: Secondary | ICD-10-CM | POA: Diagnosis not present

## 2019-03-26 ENCOUNTER — Institutional Professional Consult (permissible substitution): Payer: Medicare HMO | Admitting: Pulmonary Disease

## 2019-03-27 ENCOUNTER — Institutional Professional Consult (permissible substitution): Payer: Medicare HMO | Admitting: Pulmonary Disease

## 2019-03-28 ENCOUNTER — Institutional Professional Consult (permissible substitution): Payer: Medicare HMO | Admitting: Pulmonary Disease

## 2019-03-28 DIAGNOSIS — K8020 Calculus of gallbladder without cholecystitis without obstruction: Secondary | ICD-10-CM | POA: Diagnosis not present

## 2019-03-28 DIAGNOSIS — F0390 Unspecified dementia without behavioral disturbance: Secondary | ICD-10-CM | POA: Diagnosis not present

## 2019-03-28 DIAGNOSIS — R2681 Unsteadiness on feet: Secondary | ICD-10-CM | POA: Diagnosis not present

## 2019-03-28 DIAGNOSIS — N183 Chronic kidney disease, stage 3 (moderate): Secondary | ICD-10-CM | POA: Diagnosis not present

## 2019-03-28 DIAGNOSIS — I2510 Atherosclerotic heart disease of native coronary artery without angina pectoris: Secondary | ICD-10-CM | POA: Diagnosis not present

## 2019-03-28 DIAGNOSIS — E1122 Type 2 diabetes mellitus with diabetic chronic kidney disease: Secondary | ICD-10-CM | POA: Diagnosis not present

## 2019-03-28 DIAGNOSIS — J432 Centrilobular emphysema: Secondary | ICD-10-CM | POA: Diagnosis not present

## 2019-03-28 DIAGNOSIS — I119 Hypertensive heart disease without heart failure: Secondary | ICD-10-CM | POA: Diagnosis not present

## 2019-03-28 DIAGNOSIS — D3500 Benign neoplasm of unspecified adrenal gland: Secondary | ICD-10-CM | POA: Diagnosis not present

## 2019-03-30 ENCOUNTER — Telehealth: Payer: Self-pay | Admitting: Pulmonary Disease

## 2019-03-30 DIAGNOSIS — N183 Chronic kidney disease, stage 3 (moderate): Secondary | ICD-10-CM | POA: Diagnosis not present

## 2019-03-30 DIAGNOSIS — E1122 Type 2 diabetes mellitus with diabetic chronic kidney disease: Secondary | ICD-10-CM | POA: Diagnosis not present

## 2019-03-30 DIAGNOSIS — K8020 Calculus of gallbladder without cholecystitis without obstruction: Secondary | ICD-10-CM | POA: Diagnosis not present

## 2019-03-30 DIAGNOSIS — D3500 Benign neoplasm of unspecified adrenal gland: Secondary | ICD-10-CM | POA: Diagnosis not present

## 2019-03-30 DIAGNOSIS — J432 Centrilobular emphysema: Secondary | ICD-10-CM | POA: Diagnosis not present

## 2019-03-30 DIAGNOSIS — I119 Hypertensive heart disease without heart failure: Secondary | ICD-10-CM | POA: Diagnosis not present

## 2019-03-30 DIAGNOSIS — I2510 Atherosclerotic heart disease of native coronary artery without angina pectoris: Secondary | ICD-10-CM | POA: Diagnosis not present

## 2019-03-30 DIAGNOSIS — F0390 Unspecified dementia without behavioral disturbance: Secondary | ICD-10-CM | POA: Diagnosis not present

## 2019-03-30 DIAGNOSIS — R2681 Unsteadiness on feet: Secondary | ICD-10-CM | POA: Diagnosis not present

## 2019-03-30 NOTE — Telephone Encounter (Signed)
Attempted to contact pt to screen for covid. Pt's daughter answered and refused to let me speak to pt. Pt's daughter is no longer listed as DPR as of 03/04/2016. 03/04/16 notes states that pt would like for no one to have access to his information. Pt's daughter stated that she has POA but states she has not provided the paperwork to our office. I advised that due to HIPPA I am not able to disclose or discuss any information pt. She was angry and hung up. Virgel Paling RN notified of encounter.

## 2019-04-02 ENCOUNTER — Institutional Professional Consult (permissible substitution): Payer: Medicare HMO | Admitting: Pulmonary Disease

## 2019-04-02 DIAGNOSIS — R3911 Hesitancy of micturition: Secondary | ICD-10-CM | POA: Diagnosis not present

## 2019-04-02 DIAGNOSIS — N3941 Urge incontinence: Secondary | ICD-10-CM | POA: Diagnosis not present

## 2019-04-02 DIAGNOSIS — R972 Elevated prostate specific antigen [PSA]: Secondary | ICD-10-CM | POA: Diagnosis not present

## 2019-04-02 NOTE — Progress Notes (Deleted)
Subjective:   PATIENT ID: [name] GENDER: male DOB: September 19, 1921, MRN: [id number]   HPI  No chief complaint on file.   Reason for Visit: ***Follow-up ***New consult for ***  *** Social History:  Environmental exposures: ***  I have personally reviewed patient's past medical/family/social history, allergies, current medications.***  Past Medical History:  Diagnosis Date  . Arthritis   . CAD (coronary artery disease)    s/p DES to LAD 06/2013  . Depression   . Dyslipidemia   . Glaucoma   . Hyperglycemia 04/17/2014  . Hyperlipidemia   . Hypertension   . Stroke ([place])   . Type 2 diabetes mellitus (H[facility])      Family History  Problem Relation Age of Onset  . Arthritis Mother   . Breast cancer Mother   . Arthritis Father   . Dementia Father   . Diabetes Other   . Arthritis Other   . Colon cancer Other   . Stroke Other   . Diabetes type II Sister   . Suicidality Brother   . Diabetes type I Brother   . Diabetes type I Brother   . Diabetes type I Brother   . Diabetes type II Sister   . Breast cancer Daughter   . Emphysema Daughter      Social History   Occupational History  . Occupation: Retired  Tobacco Use  . Smoking status: Former Smoker    Types: Cigarettes    Quit date: 08/02/1968    Years since quitting: 50.6  . Smokeless tobacco: Never Used  Substance and Sexual Activity  . Alcohol use: No    Alcohol/week: 0.0 standard drinks  . Drug use: No  . Sexual activity: Not Currently    Allergies  Allergen Reactions  . Lisinopril Cough     Outpatient Medications Prior to Visit  Medication Sig Dispense Refill  . albuterol (PROVENTIL HFA;VENTOLIN HFA) 108 (90 Base) MCG/ACT inhaler INHALE TWO PUFFS EVERY 4-6 HOURS AS NEEDED FOR DIFFICULTY BREATHING,WHEEZING OR COUGH 18 g 3  . Alcohol Swabs (B-D SINGLE USE SWABS REGULAR) PADS USE AS DIRECTED WITH CHECKING BLOOD SUGAR THREE TIMES DAILY 300 each 3  . amLODipine (NORVASC) 5 MG tablet Take 1 tablet (5 mg  total) by mouth daily. 90 tablet 1  . aspirin EC 81 MG tablet Take 1 tablet (81 mg total) by mouth daily. 150 tablet 2  . Blood Glucose Calibration (ACCU-CHEK AVIVA) SOLN Use as Directed. Dx: E11.51 3 each 3  . escitalopram (LEXAPRO) 5 MG tablet TAKE 1 TABLET EVERY DAY 90 tablet 1  . glucose blood (ACCU-CHEK AVIVA) test strip Use to test blood sugar three times daily. Dx: E11.51 300 each 1  . hydrochlorothiazide (HYDRODIURIL) 25 MG tablet TAKE 1 TABLET (25 MG TOTAL) BY MOUTH DAILY. 90 tablet 1  . Insulin Glargine, 1 Unit Dial, (TOUJEO SOLOSTAR) 300 UNIT/ML SOPN Inject 65 Units into the skin daily. 15 pen 3  . Insulin Pen Needle (B-D UF III MINI PEN NEEDLES) 31G X 5 MM MISC USE AS DIRECTED. 270 each 11  . Lancet Devices (ACCU-CHEK SOFTCLIX) lancets Use as instructed with checking blood sugar three times daily. Dx: E11.51 300 each 3  . Loratadine 10 MG CAPS Take 1 capsule (10 mg total) by mouth daily. 90 capsule 1  . losartan (COZAAR) 50 MG tablet TAKE 1 TABLET (50 MG TOTAL) BY MOUTH DAILY. 90 tablet 1  . mineral oil-hydrophilic petrolatum (AQUAPHOR) ointment Apply topically 2 (two) times daily. To legs 420  g 0  . Multiple Vitamins-Minerals (SENTRY SENIOR) TABS Take 1 tablet by mouth daily.    [name] NAMZARIC 28-10 MG CP24 TAKE 1 CAPSULE EVERY DAY 90 capsule 1  . simvastatin (ZOCOR) 20 MG tablet TAKE 1 TABLET (20 MG TOTAL) BY MOUTH DAILY. 90 tablet 1  . torsemide (DEMADEX) 20 MG tablet Take 1 tablet (20 mg total) by mouth daily as needed. 90 tablet 0  . triamcinolone cream (KENALOG) 0.1 % APPLY 1 APPLICATION OF CREAM TWICE DAILY FOR 14 DAYS TO LOWER EXTRMITIES 30 g 0   No facility-administered medications prior to visit.     ROS   Objective:  There were no vitals filed for this visit.    Physical Exam: General: Well-appearing, no acute distress HENT: [place], AT, OP clear, MMM Eyes: EOMI, no scleral icterus Lymph: no cervical lymphadenopathy Respiratory: Clear to auscultation bilaterally.  No  crackles, wheezing or rales Cardiovascular: RRR, -M/R/G, no JVD GI: BS+, soft, nontender Extremities:-Edema,-tenderness Neuro: AAO x4, CNII-XII grossly intact Skin: Intact, no rashes or bruising Psych: Normal mood, normal affect  Data Reviewed:  Imaging:  PFT:  Labs:  Imaging, labs and tests noted above have been reviewed independently by me.    Assessment & Plan:   Discussion: ***  Health Maintenance Pneumonia*** Influenza*** CT Lung Screen***  No orders of the defined types were placed in this encounter. No orders of the defined types were placed in this encounter.   No follow-ups on file.  [place], MD [place] [facility] 04/02/2019 2:07 PM  Office Number [phone]

## 2019-04-03 DIAGNOSIS — N183 Chronic kidney disease, stage 3 (moderate): Secondary | ICD-10-CM | POA: Diagnosis not present

## 2019-04-03 DIAGNOSIS — R2681 Unsteadiness on feet: Secondary | ICD-10-CM | POA: Diagnosis not present

## 2019-04-03 DIAGNOSIS — E1122 Type 2 diabetes mellitus with diabetic chronic kidney disease: Secondary | ICD-10-CM | POA: Diagnosis not present

## 2019-04-03 DIAGNOSIS — D3500 Benign neoplasm of unspecified adrenal gland: Secondary | ICD-10-CM | POA: Diagnosis not present

## 2019-04-03 DIAGNOSIS — K8020 Calculus of gallbladder without cholecystitis without obstruction: Secondary | ICD-10-CM | POA: Diagnosis not present

## 2019-04-03 DIAGNOSIS — J432 Centrilobular emphysema: Secondary | ICD-10-CM | POA: Diagnosis not present

## 2019-04-03 DIAGNOSIS — I2510 Atherosclerotic heart disease of native coronary artery without angina pectoris: Secondary | ICD-10-CM | POA: Diagnosis not present

## 2019-04-03 DIAGNOSIS — F0390 Unspecified dementia without behavioral disturbance: Secondary | ICD-10-CM | POA: Diagnosis not present

## 2019-04-03 DIAGNOSIS — I119 Hypertensive heart disease without heart failure: Secondary | ICD-10-CM | POA: Diagnosis not present

## 2019-04-04 ENCOUNTER — Telehealth: Payer: Self-pay | Admitting: *Deleted

## 2019-04-04 DIAGNOSIS — D3500 Benign neoplasm of unspecified adrenal gland: Secondary | ICD-10-CM | POA: Diagnosis not present

## 2019-04-04 DIAGNOSIS — J432 Centrilobular emphysema: Secondary | ICD-10-CM | POA: Diagnosis not present

## 2019-04-04 DIAGNOSIS — K8020 Calculus of gallbladder without cholecystitis without obstruction: Secondary | ICD-10-CM | POA: Diagnosis not present

## 2019-04-04 DIAGNOSIS — I119 Hypertensive heart disease without heart failure: Secondary | ICD-10-CM | POA: Diagnosis not present

## 2019-04-04 DIAGNOSIS — E1122 Type 2 diabetes mellitus with diabetic chronic kidney disease: Secondary | ICD-10-CM | POA: Diagnosis not present

## 2019-04-04 DIAGNOSIS — R2681 Unsteadiness on feet: Secondary | ICD-10-CM | POA: Diagnosis not present

## 2019-04-04 DIAGNOSIS — F0390 Unspecified dementia without behavioral disturbance: Secondary | ICD-10-CM | POA: Diagnosis not present

## 2019-04-04 DIAGNOSIS — I2510 Atherosclerotic heart disease of native coronary artery without angina pectoris: Secondary | ICD-10-CM | POA: Diagnosis not present

## 2019-04-04 DIAGNOSIS — N183 Chronic kidney disease, stage 3 (moderate): Secondary | ICD-10-CM | POA: Diagnosis not present

## 2019-04-04 NOTE — Telephone Encounter (Signed)
1.May obtain chest X-ray 2 views to rule out PNA  2. Swallow test to evaluate Aspiration  3. Speech Therapy for dysphagia

## 2019-04-04 NOTE — Telephone Encounter (Signed)
[name] with [facility] called and stated that she needed orders for patient for:  1. Swallow Test for Aspiration  2. Chest X-Ray to R/O Pneumonia 3. Continue Speech Therapy  When he chews up his food he spits it out, Coughing a lot that's not productive when he eats.   Vitals Normal.   Please Advise.

## 2019-04-05 NOTE — Telephone Encounter (Signed)
Called and LMOM with Verbal Orders

## 2019-04-10 DIAGNOSIS — D3500 Benign neoplasm of unspecified adrenal gland: Secondary | ICD-10-CM | POA: Diagnosis not present

## 2019-04-10 DIAGNOSIS — N183 Chronic kidney disease, stage 3 (moderate): Secondary | ICD-10-CM | POA: Diagnosis not present

## 2019-04-10 DIAGNOSIS — K8020 Calculus of gallbladder without cholecystitis without obstruction: Secondary | ICD-10-CM | POA: Diagnosis not present

## 2019-04-10 DIAGNOSIS — I2510 Atherosclerotic heart disease of native coronary artery without angina pectoris: Secondary | ICD-10-CM | POA: Diagnosis not present

## 2019-04-10 DIAGNOSIS — I119 Hypertensive heart disease without heart failure: Secondary | ICD-10-CM | POA: Diagnosis not present

## 2019-04-10 DIAGNOSIS — J432 Centrilobular emphysema: Secondary | ICD-10-CM | POA: Diagnosis not present

## 2019-04-10 DIAGNOSIS — R2681 Unsteadiness on feet: Secondary | ICD-10-CM | POA: Diagnosis not present

## 2019-04-10 DIAGNOSIS — E1122 Type 2 diabetes mellitus with diabetic chronic kidney disease: Secondary | ICD-10-CM | POA: Diagnosis not present

## 2019-04-10 DIAGNOSIS — F0390 Unspecified dementia without behavioral disturbance: Secondary | ICD-10-CM | POA: Diagnosis not present

## 2019-04-11 ENCOUNTER — Encounter: Payer: Self-pay | Admitting: Family

## 2019-04-11 ENCOUNTER — Ambulatory Visit (INDEPENDENT_AMBULATORY_CARE_PROVIDER_SITE_OTHER): Payer: Medicare HMO | Admitting: Family

## 2019-04-11 ENCOUNTER — Other Ambulatory Visit: Payer: Self-pay

## 2019-04-11 VITALS — BP 123/56 | HR 74 | Temp 97.2°F

## 2019-04-11 DIAGNOSIS — R1319 Other dysphagia: Secondary | ICD-10-CM

## 2019-04-11 DIAGNOSIS — R05 Cough: Secondary | ICD-10-CM | POA: Diagnosis not present

## 2019-04-11 DIAGNOSIS — R059 Cough, unspecified: Secondary | ICD-10-CM

## 2019-04-11 MED ORDER — AQUAPHOR EX OINT: TOPICAL_OINTMENT | Freq: Two times a day (BID) | CUTANEOUS | 0 refills | Status: DC

## 2019-04-11 MED ORDER — TRIAMCINOLONE ACETONIDE 0.1 % EX CREA: TOPICAL_CREAM | CUTANEOUS | 0 refills | Status: DC

## 2019-04-11 NOTE — Progress Notes (Signed)
This service is provided via telemedicine  No vital signs collected/recorded due to the encounter was a telemedicine visit.   Location of patient (ex: home, work):  Home  Patient consents to a telephone visit:  Yes  Location of the provider (ex: office, home):  Office   Name of any referring provider:  Marlowe Sax, NP   Names of all persons participating in the telemedicine service and their role in the encounter:  Marlowe Sax, NP, Ruthell Rummage CMA, Domenick Gong, and daughter [name]   Time spent on call:  Ruthell Rummage CMA, spent 10 Minutes on phone with patient     [facility] clinic  Provider: Marlowe Sax, NP   Code Status: FULL Goals of Care:  Advanced Directives 01/22/2019  Does Patient Have a Medical Advance Directive? Yes  Type of Paramedic of [place];Living will  Does patient want to make changes to medical advance directive? No - Guardian declined  Copy of [place] in Chart? No - copy requested  Would patient like information on creating a medical advance directive? -     Chief Complaint  Patient presents with  . Acute Visit    Needs home health placed for CHX     HPI: Patient is a 85 y.o. male seen today for an acute visit for evaluation of cough.Patient's daughter providers information.she states patient has had issues with coughing during meals spitting food.seems to be choking when he eats.He has no teeth.He has had wheezing though this is chronic for patient.No fever,chills or shortness of breath reported. Has Home health physical and speech therapy  X 4 per week. ST recommends swallowing evaluation.   Past Medical History:  Diagnosis Date  . Arthritis   . CAD (coronary artery disease)    s/p DES to LAD 06/2013  . Depression   . Dyslipidemia   . Glaucoma   . Hyperglycemia 04/17/2014  . Hyperlipidemia   . Hypertension   . Stroke ([place])   . Type 2 diabetes mellitus ([place])     Past Surgical History:   Procedure Laterality Date  . CARDIAC CATHETERIZATION  06/2013   DES to LAD by Dr Tamala Julian  . Right Cataract      Allergies  Allergen Reactions  . Lisinopril Cough    Outpatient Encounter Medications as of 04/11/2019  Medication Sig  . albuterol (PROVENTIL HFA;VENTOLIN HFA) 108 (90 Base) MCG/ACT inhaler INHALE TWO PUFFS EVERY 4-6 HOURS AS NEEDED FOR DIFFICULTY BREATHING,WHEEZING OR COUGH  . Alcohol Swabs (B-D SINGLE USE SWABS REGULAR) PADS USE AS DIRECTED WITH CHECKING BLOOD SUGAR THREE TIMES DAILY  . amLODipine (NORVASC) 5 MG tablet Take 1 tablet (5 mg total) by mouth daily.  [name] aspirin EC 81 MG tablet Take 1 tablet (81 mg total) by mouth daily.  . Blood Glucose Calibration (ACCU-CHEK AVIVA) SOLN Use as Directed. Dx: E11.51  . escitalopram (LEXAPRO) 5 MG tablet TAKE 1 TABLET EVERY DAY  . glucose blood (ACCU-CHEK AVIVA) test strip Use to test blood sugar three times daily. Dx: E11.51  . hydrochlorothiazide (HYDRODIURIL) 25 MG tablet TAKE 1 TABLET (25 MG TOTAL) BY MOUTH DAILY.  [name] Insulin Glargine, 1 Unit Dial, (TOUJEO SOLOSTAR) 300 UNIT/ML SOPN Inject 65 Units into the skin daily.  . Insulin Pen Needle (B-D UF III MINI PEN NEEDLES) 31G X 5 MM MISC USE AS DIRECTED.  Elmore Guise Devices (ACCU-CHEK SOFTCLIX) lancets Use as instructed with checking blood sugar three times daily. Dx: E11.51  . Loratadine 10 MG CAPS  Take 1 capsule (10 mg total) by mouth daily.  [name] losartan (COZAAR) 50 MG tablet TAKE 1 TABLET (50 MG TOTAL) BY MOUTH DAILY.  . mineral oil-hydrophilic petrolatum (AQUAPHOR) ointment Apply topically 2 (two) times daily. To legs  . Multiple Vitamins-Minerals (SENTRY SENIOR) TABS Take 1 tablet by mouth daily.  [name] NAMZARIC 28-10 MG CP24 TAKE 1 CAPSULE EVERY DAY  . simvastatin (ZOCOR) 20 MG tablet TAKE 1 TABLET (20 MG TOTAL) BY MOUTH DAILY.  . tamsulosin (FLOMAX) 0.4 MG CAPS capsule Take 0.4 mg by mouth daily after supper.  . torsemide (DEMADEX) 20 MG tablet Take 1 tablet (20 mg total) by mouth  daily as needed.  . triamcinolone cream (KENALOG) 0.1 % APPLY 1 APPLICATION OF CREAM TWICE DAILY FOR 14 DAYS TO LOWER EXTRMITIES   No facility-administered encounter medications on file as of 04/11/2019.     Review of Systems:  Review of Systems  Constitutional: Negative for appetite change, chills, fatigue and fever.  HENT: Positive for dental problem and trouble swallowing. Negative for congestion, rhinorrhea, sinus pressure, sinus pain, sneezing and sore throat.        Coughing during meals has spit out food.   Respiratory: Positive for wheezing. Negative for cough, chest tightness and shortness of breath.   Cardiovascular: Positive for leg swelling. Negative for chest pain and palpitations.  Gastrointestinal: Negative for abdominal distention, abdominal pain, constipation, diarrhea, nausea and vomiting.  Genitourinary: Negative for difficulty urinating, dysuria, flank pain and frequency.  Musculoskeletal: Positive for gait problem. Negative for arthralgias.  Psychiatric/Behavioral: Negative for behavioral problems and confusion. The patient is not nervous/anxious.     Health Maintenance  Topic Date Due  . OPHTHALMOLOGY EXAM  06/14/2018  . FOOT EXAM  10/05/2018  . INFLUENZA VACCINE  03/03/2019  . HEMOGLOBIN A1C  06/03/2019  . TETANUS/TDAP  07/29/2025  . PNA vac Low Risk Adult  Completed    Physical Exam: Vitals:   04/11/19 1622  BP: (!) 123/56  Pulse: 74  Temp: (!) 97.2 F (36.2 C)   There is no height or weight on file to calculate BMI. Physical Exam  Unable to complete on telephone visit.   Labs reviewed: Basic Metabolic Panel: Recent Labs    04/26/18 0827 08/31/18 1034 12/01/18 1032  NA 140 145 141  K 4.2 3.9 4.3  CL 100 103 100  CO2 31 35* 35*  GLUCOSE 321* 82 42*  BUN 19 16 19   CREATININE 1.45* 1.40* 1.63*  CALCIUM 9.6 9.8 9.9  TSH  --  1.46  --    Liver Function Tests: Recent Labs    04/26/18 0827 08/31/18 1034 12/01/18 1032  AST 27 31 36*  ALT  19 22 32  BILITOT 0.3 0.5 0.5  PROT 6.4 6.7 7.1   No results for input(s): LIPASE, AMYLASE in the last 8760 hours. No results for input(s): AMMONIA in the last 8760 hours. CBC: Recent Labs    07/19/18 0922 08/31/18 1034 12/01/18 1032  WBC 4.9 4.7 6.5  NEUTROABS 1,955 2,247 4,024  HGB 11.6* 11.8* 11.6*  HCT 34.3* 35.2* 34.1*  MCV 92.7 92.9 93.2  PLT 187 180 200   Lipid Panel: Recent Labs    04/26/18 0827 08/31/18 1034 12/01/18 1032  CHOL 167 127 147  HDL 35* 32* 37*  LDLCALC 97 74 89  TRIG 229* 120 117  CHOLHDL 4.8 4.0 4.0   Lab Results  Component Value Date   HGBA1C 6.3 (H) 12/01/2018    Procedures since last visit:  No results found.  Assessment/Plan 1. Cough Afebrile.cough worst during meals concerning for aspiration.  - DG Chest 2 View; Future  2. Other dysphagia Coughs and spits out food during meals concerning for aspiration.continue with Home health speech Therapy. - SLP modified barium swallow; Future  Labs/tests ordered:   - DG Chest 2 View; Future Next appt:  06/05/2019  Spent 12 minutes of non-face to face with patient.

## 2019-04-12 ENCOUNTER — Ambulatory Visit
Admission: RE | Admit: 2019-04-12 | Discharge: 2019-04-12 | Disposition: A | Discharge status: Home / Self Care | Payer: Medicare HMO | Source: Ambulatory Visit | Attending: Family | Admitting: Family

## 2019-04-12 ENCOUNTER — Ambulatory Visit (INDEPENDENT_AMBULATORY_CARE_PROVIDER_SITE_OTHER): Payer: Medicare HMO

## 2019-04-12 ENCOUNTER — Other Ambulatory Visit: Payer: Self-pay | Admitting: Family

## 2019-04-12 DIAGNOSIS — R05 Cough: Secondary | ICD-10-CM | POA: Diagnosis not present

## 2019-04-12 DIAGNOSIS — Z23 Encounter for immunization: Secondary | ICD-10-CM

## 2019-04-12 DIAGNOSIS — R059 Cough, unspecified: Secondary | ICD-10-CM

## 2019-04-13 ENCOUNTER — Other Ambulatory Visit (HOSPITAL_COMMUNITY): Payer: Self-pay | Admitting: *Deleted

## 2019-04-13 DIAGNOSIS — R1310 Dysphagia, unspecified: Secondary | ICD-10-CM

## 2019-04-17 DIAGNOSIS — K8020 Calculus of gallbladder without cholecystitis without obstruction: Secondary | ICD-10-CM | POA: Diagnosis not present

## 2019-04-17 DIAGNOSIS — D3500 Benign neoplasm of unspecified adrenal gland: Secondary | ICD-10-CM | POA: Diagnosis not present

## 2019-04-17 DIAGNOSIS — I119 Hypertensive heart disease without heart failure: Secondary | ICD-10-CM | POA: Diagnosis not present

## 2019-04-17 DIAGNOSIS — I2510 Atherosclerotic heart disease of native coronary artery without angina pectoris: Secondary | ICD-10-CM | POA: Diagnosis not present

## 2019-04-17 DIAGNOSIS — E1122 Type 2 diabetes mellitus with diabetic chronic kidney disease: Secondary | ICD-10-CM | POA: Diagnosis not present

## 2019-04-17 DIAGNOSIS — N183 Chronic kidney disease, stage 3 (moderate): Secondary | ICD-10-CM | POA: Diagnosis not present

## 2019-04-17 DIAGNOSIS — R2681 Unsteadiness on feet: Secondary | ICD-10-CM | POA: Diagnosis not present

## 2019-04-17 DIAGNOSIS — F0390 Unspecified dementia without behavioral disturbance: Secondary | ICD-10-CM | POA: Diagnosis not present

## 2019-04-17 DIAGNOSIS — J432 Centrilobular emphysema: Secondary | ICD-10-CM | POA: Diagnosis not present

## 2019-04-18 ENCOUNTER — Telehealth: Payer: Self-pay

## 2019-04-18 DIAGNOSIS — I119 Hypertensive heart disease without heart failure: Secondary | ICD-10-CM | POA: Diagnosis not present

## 2019-04-18 DIAGNOSIS — D3500 Benign neoplasm of unspecified adrenal gland: Secondary | ICD-10-CM | POA: Diagnosis not present

## 2019-04-18 DIAGNOSIS — J432 Centrilobular emphysema: Secondary | ICD-10-CM | POA: Diagnosis not present

## 2019-04-18 DIAGNOSIS — W19XXXD Unspecified fall, subsequent encounter: Secondary | ICD-10-CM

## 2019-04-18 DIAGNOSIS — I2510 Atherosclerotic heart disease of native coronary artery without angina pectoris: Secondary | ICD-10-CM | POA: Diagnosis not present

## 2019-04-18 DIAGNOSIS — F0390 Unspecified dementia without behavioral disturbance: Secondary | ICD-10-CM | POA: Diagnosis not present

## 2019-04-18 DIAGNOSIS — K8020 Calculus of gallbladder without cholecystitis without obstruction: Secondary | ICD-10-CM | POA: Diagnosis not present

## 2019-04-18 DIAGNOSIS — E1122 Type 2 diabetes mellitus with diabetic chronic kidney disease: Secondary | ICD-10-CM | POA: Diagnosis not present

## 2019-04-18 DIAGNOSIS — R2681 Unsteadiness on feet: Secondary | ICD-10-CM | POA: Diagnosis not present

## 2019-04-18 DIAGNOSIS — N183 Chronic kidney disease, stage 3 (moderate): Secondary | ICD-10-CM | POA: Diagnosis not present

## 2019-04-18 NOTE — Telephone Encounter (Signed)
Flor from [facility] called and stated patient and daughter are requesting order for 3 in 1 bedside commode. Orland Mustard states she would like script wrote and sent to  [facility]. She states to contact patient or daughter for any questions.

## 2019-04-18 NOTE — Telephone Encounter (Signed)
Please add order in epic for the bedside commode and print out to be signed and faxed.  Thanks.

## 2019-04-18 NOTE — Telephone Encounter (Signed)
Order printed and placed in Dr. Cyndi Lennert folder to review and sign.

## 2019-04-19 ENCOUNTER — Ambulatory Visit (HOSPITAL_COMMUNITY)
Admission: RE | Admit: 2019-04-19 | Discharge: 2019-04-19 | Disposition: A | Discharge status: Home / Self Care | Payer: Medicare HMO | Source: Ambulatory Visit | Attending: Family | Admitting: Family

## 2019-04-19 ENCOUNTER — Other Ambulatory Visit: Payer: Self-pay

## 2019-04-19 DIAGNOSIS — R1319 Other dysphagia: Secondary | ICD-10-CM | POA: Insufficient documentation

## 2019-04-19 DIAGNOSIS — R1310 Dysphagia, unspecified: Secondary | ICD-10-CM | POA: Insufficient documentation

## 2019-04-19 DIAGNOSIS — R05 Cough: Secondary | ICD-10-CM | POA: Diagnosis not present

## 2019-04-21 DIAGNOSIS — J432 Centrilobular emphysema: Secondary | ICD-10-CM

## 2019-04-21 DIAGNOSIS — K8020 Calculus of gallbladder without cholecystitis without obstruction: Secondary | ICD-10-CM

## 2019-04-21 DIAGNOSIS — R2681 Unsteadiness on feet: Secondary | ICD-10-CM

## 2019-04-21 DIAGNOSIS — N183 Chronic kidney disease, stage 3 (moderate): Secondary | ICD-10-CM | POA: Diagnosis not present

## 2019-04-21 DIAGNOSIS — I2510 Atherosclerotic heart disease of native coronary artery without angina pectoris: Secondary | ICD-10-CM

## 2019-04-21 DIAGNOSIS — F0390 Unspecified dementia without behavioral disturbance: Secondary | ICD-10-CM | POA: Diagnosis not present

## 2019-04-21 DIAGNOSIS — I119 Hypertensive heart disease without heart failure: Secondary | ICD-10-CM | POA: Diagnosis not present

## 2019-04-21 DIAGNOSIS — E1122 Type 2 diabetes mellitus with diabetic chronic kidney disease: Secondary | ICD-10-CM | POA: Diagnosis not present

## 2019-04-24 NOTE — Telephone Encounter (Signed)
Order faxed to [facility].  

## 2019-04-27 DIAGNOSIS — I2510 Atherosclerotic heart disease of native coronary artery without angina pectoris: Secondary | ICD-10-CM | POA: Diagnosis not present

## 2019-04-27 DIAGNOSIS — R2681 Unsteadiness on feet: Secondary | ICD-10-CM | POA: Diagnosis not present

## 2019-04-27 DIAGNOSIS — N183 Chronic kidney disease, stage 3 (moderate): Secondary | ICD-10-CM | POA: Diagnosis not present

## 2019-04-27 DIAGNOSIS — I119 Hypertensive heart disease without heart failure: Secondary | ICD-10-CM | POA: Diagnosis not present

## 2019-04-27 DIAGNOSIS — K8020 Calculus of gallbladder without cholecystitis without obstruction: Secondary | ICD-10-CM | POA: Diagnosis not present

## 2019-04-27 DIAGNOSIS — D3500 Benign neoplasm of unspecified adrenal gland: Secondary | ICD-10-CM | POA: Diagnosis not present

## 2019-04-27 DIAGNOSIS — E1122 Type 2 diabetes mellitus with diabetic chronic kidney disease: Secondary | ICD-10-CM | POA: Diagnosis not present

## 2019-04-27 DIAGNOSIS — J432 Centrilobular emphysema: Secondary | ICD-10-CM | POA: Diagnosis not present

## 2019-04-27 DIAGNOSIS — F0390 Unspecified dementia without behavioral disturbance: Secondary | ICD-10-CM | POA: Diagnosis not present

## 2019-04-30 ENCOUNTER — Ambulatory Visit: Payer: Self-pay

## 2019-04-30 DIAGNOSIS — J432 Centrilobular emphysema: Secondary | ICD-10-CM | POA: Diagnosis not present

## 2019-04-30 DIAGNOSIS — I2510 Atherosclerotic heart disease of native coronary artery without angina pectoris: Secondary | ICD-10-CM | POA: Diagnosis not present

## 2019-04-30 DIAGNOSIS — I119 Hypertensive heart disease without heart failure: Secondary | ICD-10-CM | POA: Diagnosis not present

## 2019-04-30 DIAGNOSIS — N183 Chronic kidney disease, stage 3 (moderate): Secondary | ICD-10-CM | POA: Diagnosis not present

## 2019-04-30 DIAGNOSIS — R2681 Unsteadiness on feet: Secondary | ICD-10-CM | POA: Diagnosis not present

## 2019-04-30 DIAGNOSIS — E1122 Type 2 diabetes mellitus with diabetic chronic kidney disease: Secondary | ICD-10-CM | POA: Diagnosis not present

## 2019-04-30 DIAGNOSIS — F0390 Unspecified dementia without behavioral disturbance: Secondary | ICD-10-CM | POA: Diagnosis not present

## 2019-04-30 DIAGNOSIS — D3500 Benign neoplasm of unspecified adrenal gland: Secondary | ICD-10-CM | POA: Diagnosis not present

## 2019-04-30 DIAGNOSIS — K8020 Calculus of gallbladder without cholecystitis without obstruction: Secondary | ICD-10-CM | POA: Diagnosis not present

## 2019-05-16 ENCOUNTER — Telehealth: Payer: Self-pay
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
## 2019-05-16 NOTE — Telephone Encounter (Signed)
Can you please call [name] back and get the details about what transpired so I can provide some guidance?  I have not seen this patient but once several years ago and they just asked to switch to me from the providers that knew him.

## 2019-05-16 NOTE — Telephone Encounter (Signed)
Patient's daughter called asking if she can change dermatology appointment due to new job and unable to take off.   I advised [name] to call the dermatology office to discuss [place] appointment. [name] stated she had the number.
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
## 2019-05-17 NOTE — Telephone Encounter (Signed)
There are a couple of ways this could be handled with medication:  He could be on something on a regular basis to try to stabilize his mood or we can have something as needed if he's agitated.  Is he agitated every day or more days than not or is this a rare event?   I would suggest that if she does get into that situation again, she may actually need to call the police for her own safety.

## 2019-05-18 DIAGNOSIS — M1990 Unspecified osteoarthritis, unspecified site: Secondary | ICD-10-CM | POA: Diagnosis not present

## 2019-05-18 NOTE — Telephone Encounter (Signed)
Talked with [name] and relayed this information.  She stated she could not get her dad out of bed this morning, he would not get dressed, and he would not take his medication.  This is a recent behavior for him.  She was concerned about medications being too sedating.  Told her she could schedule a televisit with Dr. Mariea Clonts to further discuss behavior and medications.

## 2019-05-22 ENCOUNTER — Other Ambulatory Visit: Payer: Self-pay | Admitting: *Deleted

## 2019-05-22 MED ORDER — TORSEMIDE 20 MG PO TABS: 20.0000 mg | ORAL_TABLET | Freq: Every day | ORAL | 0 refills | Status: DC | PRN

## 2019-05-22 NOTE — Telephone Encounter (Signed)
[facility]

## 2019-05-28 ENCOUNTER — Other Ambulatory Visit: Payer: Self-pay

## 2019-05-28 ENCOUNTER — Ambulatory Visit (INDEPENDENT_AMBULATORY_CARE_PROVIDER_SITE_OTHER): Payer: Medicare HMO | Admitting: Internal Medicine

## 2019-05-28 ENCOUNTER — Encounter: Payer: Self-pay | Admitting: Internal Medicine

## 2019-05-28 ENCOUNTER — Encounter: Payer: Self-pay | Admitting: *Deleted

## 2019-05-28 ENCOUNTER — Emergency Department (HOSPITAL_COMMUNITY)
Admission: EM | Admit: 2019-05-28 | Discharge: 2019-05-28 | Disposition: A | Discharge status: Home / Self Care | Payer: Medicare HMO | Attending: Emergency Medicine | Admitting: Emergency Medicine

## 2019-05-28 ENCOUNTER — Encounter (HOSPITAL_COMMUNITY): Payer: Self-pay

## 2019-05-28 ENCOUNTER — Telehealth: Payer: Self-pay

## 2019-05-28 VITALS — BP 150/68 | HR 65 | Temp 98.1°F | Ht 71.0 in | Wt 180.0 lb

## 2019-05-28 DIAGNOSIS — Z7982 Long term (current) use of aspirin: Secondary | ICD-10-CM | POA: Diagnosis not present

## 2019-05-28 DIAGNOSIS — I2510 Atherosclerotic heart disease of native coronary artery without angina pectoris: Secondary | ICD-10-CM | POA: Insufficient documentation

## 2019-05-28 DIAGNOSIS — N289 Disorder of kidney and ureter, unspecified: Secondary | ICD-10-CM | POA: Diagnosis not present

## 2019-05-28 DIAGNOSIS — R404 Transient alteration of awareness: Secondary | ICD-10-CM | POA: Diagnosis not present

## 2019-05-28 DIAGNOSIS — Z87891 Personal history of nicotine dependence: Secondary | ICD-10-CM | POA: Diagnosis not present

## 2019-05-28 DIAGNOSIS — E119 Type 2 diabetes mellitus without complications: Secondary | ICD-10-CM | POA: Insufficient documentation

## 2019-05-28 DIAGNOSIS — I10 Essential (primary) hypertension: Secondary | ICD-10-CM | POA: Diagnosis not present

## 2019-05-28 DIAGNOSIS — R456 Violent behavior: Secondary | ICD-10-CM | POA: Diagnosis not present

## 2019-05-28 DIAGNOSIS — F0391 Unspecified dementia with behavioral disturbance: Secondary | ICD-10-CM

## 2019-05-28 DIAGNOSIS — Z79899 Other long term (current) drug therapy: Secondary | ICD-10-CM | POA: Insufficient documentation

## 2019-05-28 DIAGNOSIS — Z794 Long term (current) use of insulin: Secondary | ICD-10-CM | POA: Diagnosis not present

## 2019-05-28 DIAGNOSIS — Z8673 Personal history of transient ischemic attack (TIA), and cerebral infarction without residual deficits: Secondary | ICD-10-CM | POA: Insufficient documentation

## 2019-05-28 DIAGNOSIS — R0902 Hypoxemia: Secondary | ICD-10-CM | POA: Diagnosis not present

## 2019-05-28 LAB — CBC WITH DIFFERENTIAL/PLATELET
Abs Immature Granulocytes: 0.01 10*3/uL (ref 0.00–0.07)
Basophils Absolute: 0 10*3/uL (ref 0.0–0.1)
Basophils Relative: 1 %
Eosinophils Absolute: 0.1 10*3/uL (ref 0.0–0.5)
Eosinophils Relative: 3 %
HCT: 33.3 % — ABNORMAL LOW (ref 39.0–52.0)
Hemoglobin: 10.4 g/dL — ABNORMAL LOW (ref 13.0–17.0)
Immature Granulocytes: 0 %
Lymphocytes Relative: 30 %
Lymphs Abs: 1.2 10*3/uL (ref 0.7–4.0)
MCH: 30.6 pg (ref 26.0–34.0)
MCHC: 31.2 g/dL (ref 30.0–36.0)
MCV: 97.9 fL (ref 80.0–100.0)
Monocytes Absolute: 0.5 10*3/uL (ref 0.1–1.0)
Monocytes Relative: 12 %
Neutro Abs: 2.2 10*3/uL (ref 1.7–7.7)
Neutrophils Relative %: 54 %
Platelets: 173 10*3/uL (ref 150–400)
RBC: 3.4 MIL/uL — ABNORMAL LOW (ref 4.22–5.81)
RDW: 13.4 % (ref 11.5–15.5)
WBC: 4 10*3/uL (ref 4.0–10.5)
nRBC: 0 % (ref 0.0–0.2)

## 2019-05-28 LAB — RAPID URINE DRUG SCREEN, HOSP PERFORMED
Amphetamines: NOT DETECTED
Barbiturates: NOT DETECTED
Benzodiazepines: NOT DETECTED
Cocaine: NOT DETECTED
Opiates: NOT DETECTED
Tetrahydrocannabinol: NOT DETECTED

## 2019-05-28 LAB — COMPREHENSIVE METABOLIC PANEL WITH GFR
ALT: 29 U/L (ref 0–44)
AST: 41 U/L (ref 15–41)
Albumin: 3.8 g/dL (ref 3.5–5.0)
Alkaline Phosphatase: 61 U/L (ref 38–126)
Anion gap: 13 (ref 5–15)
BUN: 18 mg/dL (ref 8–23)
CO2: 24 mmol/L (ref 22–32)
Calcium: 9.2 mg/dL (ref 8.9–10.3)
Chloride: 104 mmol/L (ref 98–111)
Creatinine, Ser: 1.49 mg/dL — ABNORMAL HIGH (ref 0.61–1.24)
GFR calc Af Amer: 46 mL/min — ABNORMAL LOW
GFR calc non Af Amer: 39 mL/min — ABNORMAL LOW
Glucose, Bld: 149 mg/dL — ABNORMAL HIGH (ref 70–99)
Potassium: 4.3 mmol/L (ref 3.5–5.1)
Sodium: 141 mmol/L (ref 135–145)
Total Bilirubin: 0.5 mg/dL (ref 0.3–1.2)
Total Protein: 6.5 g/dL (ref 6.5–8.1)

## 2019-05-28 LAB — URINALYSIS, ROUTINE W REFLEX MICROSCOPIC
Bilirubin Urine: NEGATIVE
Glucose, UA: NEGATIVE mg/dL
Hgb urine dipstick: NEGATIVE
Ketones, ur: NEGATIVE mg/dL
Leukocytes,Ua: NEGATIVE
Nitrite: NEGATIVE
Protein, ur: NEGATIVE mg/dL
Specific Gravity, Urine: 1.012 (ref 1.005–1.030)
pH: 5 (ref 5.0–8.0)

## 2019-05-28 LAB — ETHANOL: Alcohol, Ethyl (B): <10 mg/dL (ref <10)

## 2019-05-28 MED ORDER — HALOPERIDOL 5 MG PO TABS
5.0000 mg | ORAL_TABLET | Freq: Once | ORAL | Status: AC
  Administered 2019-05-28: 5 mg via ORAL
  Filled 2019-05-28: qty 1

## 2019-05-28 MED ORDER — HALOPERIDOL 5 MG PO TABS: 5.0000 mg | ORAL_TABLET | Freq: Four times a day (QID) | ORAL | 0 refills | Status: DC | PRN

## 2019-05-28 MED ORDER — TRIAMCINOLONE ACETONIDE 0.1 % EX CREA: TOPICAL_CREAM | CUTANEOUS | 0 refills | Status: DC

## 2019-05-28 NOTE — Telephone Encounter (Signed)
[name], patient's daughter, presented for an office visit today with her father.  She gave me a durable POA.  I called her to let her know that this enabled her to handle her dad's financial matters, but did not allow her to make medications decisions for him, that she would need a HCPOA in order to do that.

## 2019-05-28 NOTE — ED Notes (Signed)
When attempting to get blood off pt or a ekg the pt became a little more agitated and withdrawn. Flexing muscles and pulling away from staff. Per ems the pt is aggressive. Waiting for doctors orders before another attempt is made.

## 2019-05-28 NOTE — Telephone Encounter (Signed)
error 

## 2019-05-28 NOTE — Discharge Instructions (Signed)
Please give the haloperidol at dinnertime and at bedtime. If necessary, you may also give him a dose earlier in the day, or if he has more problems late at night.

## 2019-05-28 NOTE — ED Notes (Signed)
..  Patient caregiver verbalizes understanding of discharge instructions. Opportunity for questioning and answers were provided. Armband removed by staff, pt discharged from ED. Pt daughter given discharge instructions and educated on prescription.

## 2019-05-28 NOTE — ED Notes (Signed)
Pt ambulated to restroom with assistance of 1 RN.

## 2019-05-28 NOTE — ED Provider Notes (Signed)
[facility] EMERGENCY DEPARTMENT Provider Note   CSN: [id number] Arrival date & time: 05/28/19  0204    History   Chief Complaint Chief Complaint  Patient presents with  . Aggressive Behavior    HPI [name] is a 85 y.o. male.   The history is provided by a relative. The history is limited by the condition of the patient (Dementia).  He has history of hypertension, diabetes, hyperlipidemia, stroke and has been having aggressive behavior at home which family has not been able to deal with.  They had discussed with his PCP, but has not received any medication to help control behavior.  Problems are mainly at night.  In the ED, patient has been aggressive towards staff.  Past Medical History:  Diagnosis Date  . Arthritis   . CAD (coronary artery disease)    s/p DES to LAD 06/2013  . Depression   . Dyslipidemia   . Glaucoma   . Hyperglycemia 04/17/2014  . Hyperlipidemia   . Hypertension   . Stroke ([place])   . Type 2 diabetes mellitus [facility])     Patient Active Problem List   Diagnosis Date Noted  . Pain due to onychomycosis of toenails of both feet 01/23/2019  . Bilateral lower extremity edema 07/05/2017  . Pulmonary nodules 04/13/2017  . Centrilobular emphysema ([place]) 04/13/2017  . Calculus of gallbladder without cholecystitis without obstruction 04/13/2017  . Chronic cough 04/13/2017  . Adenoma of left adrenal gland 04/13/2017  . Frequent falls 04/01/2017  . Type 2 diabetes mellitus with stage 3 chronic kidney disease, with long-term current use of insulin ([place]) 04/01/2017  . History of tobacco abuse 04/01/2017  . Unsteady gait 04/01/2017  . Dysarthria 04/01/2017  . History of stroke   . Hyperlipidemia   . Moderate dementia with behavioral disturbance ([place]) 09/19/2015  . Depression 09/19/2015  . Hypertensive heart disease 09/10/2015  . Type 2 diabetes, controlled, with peripheral circulatory disorder ([place])   . Essential hypertension   .  Coronary artery disease involving native coronary artery of native heart with angina pectoris ([place])   . Arthritis   . Glaucoma     Past Surgical History:  Procedure Laterality Date  . CARDIAC CATHETERIZATION  06/2013   DES to LAD by Dr Tamala Julian  . Right Cataract          Home Medications    Prior to Admission medications   Medication Sig Start Date End Date Taking? Authorizing Provider  albuterol (PROVENTIL HFA;VENTOLIN HFA) 108 (90 Base) MCG/ACT inhaler INHALE TWO PUFFS EVERY 4-6 HOURS AS NEEDED FOR DIFFICULTY BREATHING,WHEEZING OR COUGH 08/31/18   Ngetich, Dinah C, NP  Alcohol Swabs (B-D SINGLE USE SWABS REGULAR) PADS USE AS DIRECTED WITH CHECKING BLOOD SUGAR THREE TIMES DAILY 09/07/17   Gildardo Cranker, DO  amLODipine (NORVASC) 5 MG tablet Take 1 tablet (5 mg total) by mouth daily. 08/31/18   Ngetich, Dinah C, NP  aspirin EC 81 MG tablet Take 1 tablet (81 mg total) by mouth daily. 06/22/13   Rowe Clack, MD  Blood Glucose Calibration (ACCU-CHEK AVIVA) SOLN Use as Directed. Dx: E11.51 04/20/16   Gildardo Cranker, DO  escitalopram (LEXAPRO) 5 MG tablet TAKE 1 TABLET EVERY DAY 02/13/19   Ngetich, Dinah C, NP  glucose blood (ACCU-CHEK AVIVA) test strip Use to test blood sugar three times daily. Dx: E11.51 08/31/18   Ngetich, Dinah C, NP  hydrochlorothiazide (HYDRODIURIL) 25 MG tablet TAKE 1 TABLET (25 MG TOTAL) BY MOUTH DAILY. 02/13/19  Ngetich, Dinah C, NP  Insulin Glargine, 1 Unit Dial, (TOUJEO SOLOSTAR) 300 UNIT/ML SOPN Inject 65 Units into the skin daily. 11/06/18   Ngetich, Dinah C, NP  Insulin Pen Needle (B-D UF III MINI PEN NEEDLES) 31G X 5 MM MISC USE AS DIRECTED. 08/31/18   Ngetich, Dinah C, NP  Lancet Devices (ACCU-CHEK SOFTCLIX) lancets Use as instructed with checking blood sugar three times daily. Dx: E11.51 04/01/17   Gildardo Cranker, DO  Loratadine 10 MG CAPS Take 1 capsule (10 mg total) by mouth daily. 02/13/19   Ngetich, Dinah C, NP  losartan (COZAAR) 50 MG tablet TAKE 1 TABLET (50  MG TOTAL) BY MOUTH DAILY. 02/13/19   Ngetich, Dinah C, NP  mineral oil-hydrophilic petrolatum (AQUAPHOR) ointment Apply topically 2 (two) times daily. To legs 04/11/19   Ngetich, Dinah C, NP  Multiple Vitamins-Minerals (SENTRY SENIOR) TABS Take 1 tablet by mouth daily.    [provider]  NAMZARIC 28-10 MG CP24 TAKE 1 CAPSULE EVERY DAY 02/13/19   Ngetich, Dinah C, NP  simvastatin (ZOCOR) 20 MG tablet TAKE 1 TABLET (20 MG TOTAL) BY MOUTH DAILY. 02/13/19   Ngetich, Dinah C, NP  tamsulosin (FLOMAX) 0.4 MG CAPS capsule Take 0.4 mg by mouth daily after supper.    [provider]  torsemide (DEMADEX) 20 MG tablet Take 1 tablet (20 mg total) by mouth daily as needed. 05/22/19   Reed, Tiffany L, DO  triamcinolone cream (KENALOG) 0.1 % APPLY 1 APPLICATION OF CREAM TWICE DAILY FOR 14 DAYS TO LOWER EXTRMITIES 04/11/19   Ngetich, Nelda Bucks, NP    Family History Family History  Problem Relation Age of Onset  . Arthritis Mother   . Breast cancer Mother   . Arthritis Father   . Dementia Father   . Diabetes Other   . Arthritis Other   . Colon cancer Other   . Stroke Other   . Diabetes type II Sister   . Suicidality Brother   . Diabetes type I Brother   . Diabetes type I Brother   . Diabetes type I Brother   . Diabetes type II Sister   . Breast cancer Daughter   . Emphysema Daughter     Social History Social History   Tobacco Use  . Smoking status: Former Smoker    Types: Cigarettes    Quit date: 08/02/1968    Years since quitting: 50.8  . Smokeless tobacco: Never Used  Substance Use Topics  . Alcohol use: No    Alcohol/week: 0.0 standard drinks  . Drug use: No     Allergies   Lisinopril   Review of Systems Review of Systems  Unable to perform ROS: Dementia     Physical Exam Updated Vital Signs BP 130/66 (BP Location: Left Arm)   Pulse 76   Resp 15   SpO2 94%   Physical Exam Vitals signs and nursing note reviewed.    85 year old male, resting comfortably and  in no acute distress. Vital signs are normal. Oxygen saturation is 94%, which is normal. Head is normocephalic and atraumatic. PERRLA, EOMI. Oropharynx is clear. Neck is nontender and supple without adenopathy or JVD. Back is nontender and there is no CVA tenderness. Lungs are clear without rales, wheezes, or rhonchi. Chest is nontender. Heart has regular rate and rhythm without murmur. Abdomen is soft, flat, nontender without masses or hepatosplenomegaly and peristalsis is normoactive. Extremities have 1+ edema, full range of motion is present. Skin is warm and dry without  rash. Neurologic: Awake and oriented to person but not place or time, cranial nerves are intact, there are no motor or sensory deficits.  ED Treatments / Results  Labs (all labs ordered are listed, but only abnormal results are displayed) Labs Reviewed  COMPREHENSIVE METABOLIC PANEL - Abnormal; Notable for the following components:      Result Value   Glucose, Bld 149 (*)    Creatinine, Ser 1.49 (*)    GFR calc non Af Amer 39 (*)    GFR calc Af Amer 46 (*)    All other components within normal limits  ETHANOL  URINALYSIS, ROUTINE W REFLEX MICROSCOPIC  CBC WITH DIFFERENTIAL/PLATELET  RAPID URINE DRUG SCREEN, HOSP PERFORMED  CBC WITH DIFFERENTIAL/PLATELET   Procedures Procedures  Medications Ordered in ED Medications  haloperidol (HALDOL) tablet 5 mg (5 mg Oral Given 05/28/19 0231)     Initial Impression / Assessment and Plan / ED Course  I have reviewed the triage vital signs and the nursing notes.  Pertinent labs & imaging results that were available during my care of the patient were reviewed by me and considered in my medical decision making (see chart for details).  Dementia with behavior disturbance.  Family feels that he is too dangerous to be kept at home.  Will check screening labs and will give haloperidol for sedation.  Old records are reviewed confirming recent telephone conversations with PCP  regarding behavior problems.  Following haloperidol, patient was calm and cooperative.  Family member feels that she can handle him at home if she has similar medication to give him at home.  She is requesting case management to talk with her about possible home health services that might be available.  Labs show mild renal insufficiency which is unchanged from baseline.  I have suggested to her that he should get a dose of haloperidol at dinnertime and at bedtime and have sent a prescription for haloperidol to her pharmacy.  Case is signed out to Dr. Lita Mains.  Final Clinical Impressions(s) / ED Diagnoses   Final diagnoses:  Dementia with behavioral disturbance, unspecified dementia type [facility])  Renal insufficiency    ED Discharge Orders    None       Delora Fuel, MD [id number] [phone]
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
## 2019-05-29 ENCOUNTER — Ambulatory Visit: Payer: Medicare HMO | Admitting: Podiatry

## 2019-05-31 ENCOUNTER — Ambulatory Visit (INDEPENDENT_AMBULATORY_CARE_PROVIDER_SITE_OTHER): Payer: Medicare HMO | Admitting: Internal Medicine

## 2019-05-31 DIAGNOSIS — F0391 Unspecified dementia with behavioral disturbance: Secondary | ICD-10-CM

## 2019-05-31 NOTE — Progress Notes (Signed)
CMA spoke with pt's daughter who noted she was not ready to do the call as scheduled and her dad was still in bed.  She asked to reschedule.  Hedda Crumbley L. Trenae Brunke, D.O. [facility] [address], [place] [address] Cell Phone (Mon-Fri 8am-5pm):  [phone] On Call:  [phone] & follow prompts after 5pm & weekends Office Phone:  [phone] Office Fax:  [phone]

## 2019-06-01 ENCOUNTER — Encounter: Payer: Self-pay | Admitting: Family

## 2019-06-01 ENCOUNTER — Inpatient Hospital Stay (HOSPITAL_COMMUNITY)
Admission: EM | Admit: 2019-06-01 | Discharge: 2019-06-05 | DRG: 070 | Disposition: A | Discharge status: Home Health | Payer: Medicare HMO | Attending: Internal Medicine | Admitting: Internal Medicine

## 2019-06-01 ENCOUNTER — Other Ambulatory Visit: Payer: Self-pay

## 2019-06-01 ENCOUNTER — Inpatient Hospital Stay (HOSPITAL_COMMUNITY): Payer: Medicare HMO

## 2019-06-01 ENCOUNTER — Emergency Department (HOSPITAL_COMMUNITY): Payer: Medicare HMO

## 2019-06-01 ENCOUNTER — Ambulatory Visit (INDEPENDENT_AMBULATORY_CARE_PROVIDER_SITE_OTHER): Payer: Medicare HMO | Admitting: Family

## 2019-06-01 ENCOUNTER — Encounter (HOSPITAL_COMMUNITY): Payer: Self-pay | Admitting: Emergency Medicine

## 2019-06-01 DIAGNOSIS — N183 Chronic kidney disease, stage 3 unspecified: Secondary | ICD-10-CM | POA: Diagnosis present

## 2019-06-01 DIAGNOSIS — N179 Acute kidney failure, unspecified: Secondary | ICD-10-CM | POA: Diagnosis not present

## 2019-06-01 DIAGNOSIS — Z87891 Personal history of nicotine dependence: Secondary | ICD-10-CM

## 2019-06-01 DIAGNOSIS — E1151 Type 2 diabetes mellitus with diabetic peripheral angiopathy without gangrene: Secondary | ICD-10-CM | POA: Diagnosis present

## 2019-06-01 DIAGNOSIS — Y92009 Unspecified place in unspecified non-institutional (private) residence as the place of occurrence of the external cause: Secondary | ICD-10-CM

## 2019-06-01 DIAGNOSIS — I6389 Other cerebral infarction: Secondary | ICD-10-CM | POA: Diagnosis not present

## 2019-06-01 DIAGNOSIS — F0391 Unspecified dementia with behavioral disturbance: Secondary | ICD-10-CM

## 2019-06-01 DIAGNOSIS — G9389 Other specified disorders of brain: Secondary | ICD-10-CM | POA: Diagnosis present

## 2019-06-01 DIAGNOSIS — I491 Atrial premature depolarization: Secondary | ICD-10-CM | POA: Diagnosis not present

## 2019-06-01 DIAGNOSIS — R52 Pain, unspecified: Secondary | ICD-10-CM

## 2019-06-01 DIAGNOSIS — I69322 Dysarthria following cerebral infarction: Secondary | ICD-10-CM

## 2019-06-01 DIAGNOSIS — H9190 Unspecified hearing loss, unspecified ear: Secondary | ICD-10-CM | POA: Diagnosis present

## 2019-06-01 DIAGNOSIS — F03B18 Unspecified dementia, moderate, with other behavioral disturbance: Secondary | ICD-10-CM | POA: Diagnosis present

## 2019-06-01 DIAGNOSIS — R2689 Other abnormalities of gait and mobility: Secondary | ICD-10-CM | POA: Diagnosis not present

## 2019-06-01 DIAGNOSIS — R4182 Altered mental status, unspecified: Secondary | ICD-10-CM | POA: Diagnosis not present

## 2019-06-01 DIAGNOSIS — D649 Anemia, unspecified: Secondary | ICD-10-CM | POA: Diagnosis present

## 2019-06-01 DIAGNOSIS — Z794 Long term (current) use of insulin: Secondary | ICD-10-CM

## 2019-06-01 DIAGNOSIS — W1830XA Fall on same level, unspecified, initial encounter: Secondary | ICD-10-CM | POA: Diagnosis present

## 2019-06-01 DIAGNOSIS — M4802 Spinal stenosis, cervical region: Secondary | ICD-10-CM | POA: Diagnosis present

## 2019-06-01 DIAGNOSIS — Z818 Family history of other mental and behavioral disorders: Secondary | ICD-10-CM

## 2019-06-01 DIAGNOSIS — Z9181 History of falling: Secondary | ICD-10-CM

## 2019-06-01 DIAGNOSIS — R29818 Other symptoms and signs involving the nervous system: Secondary | ICD-10-CM | POA: Diagnosis not present

## 2019-06-01 DIAGNOSIS — E785 Hyperlipidemia, unspecified: Secondary | ICD-10-CM | POA: Diagnosis present

## 2019-06-01 DIAGNOSIS — R29810 Facial weakness: Secondary | ICD-10-CM | POA: Diagnosis not present

## 2019-06-01 DIAGNOSIS — M47812 Spondylosis without myelopathy or radiculopathy, cervical region: Secondary | ICD-10-CM | POA: Diagnosis present

## 2019-06-01 DIAGNOSIS — M19011 Primary osteoarthritis, right shoulder: Secondary | ICD-10-CM | POA: Diagnosis not present

## 2019-06-01 DIAGNOSIS — R531 Weakness: Secondary | ICD-10-CM | POA: Diagnosis not present

## 2019-06-01 DIAGNOSIS — Z888 Allergy status to other drugs, medicaments and biological substances status: Secondary | ICD-10-CM

## 2019-06-01 DIAGNOSIS — F329 Major depressive disorder, single episode, unspecified: Secondary | ICD-10-CM | POA: Diagnosis present

## 2019-06-01 DIAGNOSIS — J432 Centrilobular emphysema: Secondary | ICD-10-CM | POA: Diagnosis present

## 2019-06-01 DIAGNOSIS — R404 Transient alteration of awareness: Secondary | ICD-10-CM | POA: Diagnosis not present

## 2019-06-01 DIAGNOSIS — Z743 Need for continuous supervision: Secondary | ICD-10-CM | POA: Diagnosis not present

## 2019-06-01 DIAGNOSIS — N1831 Chronic kidney disease, stage 3a: Secondary | ICD-10-CM | POA: Diagnosis not present

## 2019-06-01 DIAGNOSIS — Z823 Family history of stroke: Secondary | ICD-10-CM

## 2019-06-01 DIAGNOSIS — Z20828 Contact with and (suspected) exposure to other viral communicable diseases: Secondary | ICD-10-CM | POA: Diagnosis not present

## 2019-06-01 DIAGNOSIS — R000 Tachycardia, unspecified: Secondary | ICD-10-CM | POA: Diagnosis not present

## 2019-06-01 DIAGNOSIS — I248 Other forms of acute ischemic heart disease: Secondary | ICD-10-CM | POA: Diagnosis not present

## 2019-06-01 DIAGNOSIS — I130 Hypertensive heart and chronic kidney disease with heart failure and stage 1 through stage 4 chronic kidney disease, or unspecified chronic kidney disease: Secondary | ICD-10-CM | POA: Diagnosis present

## 2019-06-01 DIAGNOSIS — R2681 Unsteadiness on feet: Secondary | ICD-10-CM

## 2019-06-01 DIAGNOSIS — I10 Essential (primary) hypertension: Secondary | ICD-10-CM | POA: Diagnosis not present

## 2019-06-01 DIAGNOSIS — H409 Unspecified glaucoma: Secondary | ICD-10-CM | POA: Diagnosis present

## 2019-06-01 DIAGNOSIS — F419 Anxiety disorder, unspecified: Secondary | ICD-10-CM | POA: Diagnosis present

## 2019-06-01 DIAGNOSIS — E11649 Type 2 diabetes mellitus with hypoglycemia without coma: Secondary | ICD-10-CM | POA: Diagnosis not present

## 2019-06-01 DIAGNOSIS — I4510 Unspecified right bundle-branch block: Secondary | ICD-10-CM | POA: Diagnosis present

## 2019-06-01 DIAGNOSIS — N400 Enlarged prostate without lower urinary tract symptoms: Secondary | ICD-10-CM | POA: Diagnosis present

## 2019-06-01 DIAGNOSIS — M1990 Unspecified osteoarthritis, unspecified site: Secondary | ICD-10-CM | POA: Diagnosis present

## 2019-06-01 DIAGNOSIS — I82403 Acute embolism and thrombosis of unspecified deep veins of lower extremity, bilateral: Secondary | ICD-10-CM | POA: Diagnosis not present

## 2019-06-01 DIAGNOSIS — R296 Repeated falls: Secondary | ICD-10-CM | POA: Diagnosis present

## 2019-06-01 DIAGNOSIS — I2699 Other pulmonary embolism without acute cor pulmonale: Secondary | ICD-10-CM | POA: Diagnosis not present

## 2019-06-01 DIAGNOSIS — Z833 Family history of diabetes mellitus: Secondary | ICD-10-CM

## 2019-06-01 DIAGNOSIS — I25119 Atherosclerotic heart disease of native coronary artery with unspecified angina pectoris: Secondary | ICD-10-CM | POA: Diagnosis present

## 2019-06-01 DIAGNOSIS — G9341 Metabolic encephalopathy: Principal | ICD-10-CM | POA: Diagnosis present

## 2019-06-01 DIAGNOSIS — R410 Disorientation, unspecified: Secondary | ICD-10-CM | POA: Diagnosis not present

## 2019-06-01 DIAGNOSIS — Z8261 Family history of arthritis: Secondary | ICD-10-CM

## 2019-06-01 DIAGNOSIS — S0081XA Abrasion of other part of head, initial encounter: Secondary | ICD-10-CM | POA: Diagnosis present

## 2019-06-01 DIAGNOSIS — I5032 Chronic diastolic (congestive) heart failure: Secondary | ICD-10-CM | POA: Diagnosis not present

## 2019-06-01 DIAGNOSIS — I2510 Atherosclerotic heart disease of native coronary artery without angina pectoris: Secondary | ICD-10-CM | POA: Diagnosis present

## 2019-06-01 DIAGNOSIS — I351 Nonrheumatic aortic (valve) insufficiency: Secondary | ICD-10-CM | POA: Diagnosis not present

## 2019-06-01 DIAGNOSIS — R456 Violent behavior: Secondary | ICD-10-CM | POA: Diagnosis not present

## 2019-06-01 DIAGNOSIS — F29 Unspecified psychosis not due to a substance or known physiological condition: Secondary | ICD-10-CM | POA: Diagnosis not present

## 2019-06-01 DIAGNOSIS — Z8673 Personal history of transient ischemic attack (TIA), and cerebral infarction without residual deficits: Secondary | ICD-10-CM

## 2019-06-01 DIAGNOSIS — I6501 Occlusion and stenosis of right vertebral artery: Secondary | ICD-10-CM | POA: Diagnosis not present

## 2019-06-01 DIAGNOSIS — E1122 Type 2 diabetes mellitus with diabetic chronic kidney disease: Secondary | ICD-10-CM | POA: Diagnosis present

## 2019-06-01 DIAGNOSIS — Z955 Presence of coronary angioplasty implant and graft: Secondary | ICD-10-CM

## 2019-06-01 DIAGNOSIS — Z825 Family history of asthma and other chronic lower respiratory diseases: Secondary | ICD-10-CM

## 2019-06-01 DIAGNOSIS — Z79899 Other long term (current) drug therapy: Secondary | ICD-10-CM

## 2019-06-01 DIAGNOSIS — R279 Unspecified lack of coordination: Secondary | ICD-10-CM | POA: Diagnosis not present

## 2019-06-01 DIAGNOSIS — M25511 Pain in right shoulder: Secondary | ICD-10-CM | POA: Diagnosis present

## 2019-06-01 DIAGNOSIS — J90 Pleural effusion, not elsewhere classified: Secondary | ICD-10-CM | POA: Diagnosis not present

## 2019-06-01 DIAGNOSIS — Z7982 Long term (current) use of aspirin: Secondary | ICD-10-CM

## 2019-06-01 DIAGNOSIS — R918 Other nonspecific abnormal finding of lung field: Secondary | ICD-10-CM | POA: Diagnosis not present

## 2019-06-01 LAB — COMPREHENSIVE METABOLIC PANEL
ALT: 67 U/L — ABNORMAL HIGH (ref 0–44)
AST: 166 U/L — ABNORMAL HIGH (ref 15–41)
Albumin: 3.6 g/dL (ref 3.5–5.0)
Alkaline Phosphatase: 50 U/L (ref 38–126)
Anion gap: 12 (ref 5–15)
BUN: 29 mg/dL — ABNORMAL HIGH (ref 8–23)
CO2: 29 mmol/L (ref 22–32)
Calcium: 9.6 mg/dL (ref 8.9–10.3)
Chloride: 100 mmol/L (ref 98–111)
Creatinine, Ser: 2.08 mg/dL — ABNORMAL HIGH (ref 0.61–1.24)
GFR calc Af Amer: 30 mL/min — ABNORMAL LOW (ref >60)
GFR calc non Af Amer: 26 mL/min — ABNORMAL LOW (ref >60)
Glucose, Bld: 112 mg/dL — ABNORMAL HIGH (ref 70–99)
Potassium: 4.8 mmol/L (ref 3.5–5.1)
Sodium: 141 mmol/L (ref 135–145)
Total Bilirubin: 1.7 mg/dL — ABNORMAL HIGH (ref 0.3–1.2)
Total Protein: 6.6 g/dL (ref 6.5–8.1)

## 2019-06-01 LAB — LACTIC ACID, PLASMA: Lactic Acid, Venous: 1.5 mmol/L (ref 0.5–1.9)

## 2019-06-01 LAB — CBG MONITORING, ED
Glucose-Capillary: 98 mg/dL (ref 70–99)
Glucose-Capillary: 89 mg/dL (ref 70–99)

## 2019-06-01 LAB — CBC WITH DIFFERENTIAL/PLATELET
Abs Immature Granulocytes: 0.02 10*3/uL (ref 0.00–0.07)
Basophils Absolute: 0 10*3/uL (ref 0.0–0.1)
Basophils Relative: 0 %
Eosinophils Absolute: 0.1 10*3/uL (ref 0.0–0.5)
Eosinophils Relative: 1 %
HCT: 35.5 % — ABNORMAL LOW (ref 39.0–52.0)
Hemoglobin: 11.7 g/dL — ABNORMAL LOW (ref 13.0–17.0)
Immature Granulocytes: 0 %
Lymphocytes Relative: 24 %
Lymphs Abs: 1.9 10*3/uL (ref 0.7–4.0)
MCH: 31 pg (ref 26.0–34.0)
MCHC: 33 g/dL (ref 30.0–36.0)
MCV: 94.2 fL (ref 80.0–100.0)
Monocytes Absolute: 1.2 10*3/uL — ABNORMAL HIGH (ref 0.1–1.0)
Monocytes Relative: 14 %
Neutro Abs: 5 10*3/uL (ref 1.7–7.7)
Neutrophils Relative %: 61 %
Platelets: UNDETERMINED 10*3/uL (ref 150–400)
RBC: 3.77 MIL/uL — ABNORMAL LOW (ref 4.22–5.81)
RDW: 13.7 % (ref 11.5–15.5)
WBC: 8.1 10*3/uL (ref 4.0–10.5)
nRBC: 0 % (ref 0.0–0.2)

## 2019-06-01 LAB — I-STAT CHEM 8, ED
BUN: 29 mg/dL — ABNORMAL HIGH (ref 8–23)
Calcium, Ion: 1.2 mmol/L (ref 1.15–1.40)
Chloride: 99 mmol/L (ref 98–111)
Creatinine, Ser: 2 mg/dL — ABNORMAL HIGH (ref 0.61–1.24)
Glucose, Bld: 97 mg/dL (ref 70–99)
HCT: 32 % — ABNORMAL LOW (ref 39.0–52.0)
Hemoglobin: 10.9 g/dL — ABNORMAL LOW (ref 13.0–17.0)
Potassium: 4 mmol/L (ref 3.5–5.1)
Sodium: 141 mmol/L (ref 135–145)
TCO2: 31 mmol/L (ref 22–32)

## 2019-06-01 LAB — BRAIN NATRIURETIC PEPTIDE: B Natriuretic Peptide: 65.8 pg/mL (ref 0.0–100.0)

## 2019-06-01 LAB — CK: Total CK: 3868 U/L — ABNORMAL HIGH (ref 49–397)

## 2019-06-01 LAB — AMMONIA: Ammonia: 29 umol/L (ref 9–35)

## 2019-06-01 LAB — TSH: TSH: 1.527 u[IU]/mL (ref 0.350–4.500)

## 2019-06-01 MED ORDER — ONDANSETRON HCL 4 MG PO TABS: 4.0000 mg | ORAL_TABLET | Freq: Four times a day (QID) | ORAL | Status: DC | PRN

## 2019-06-01 MED ORDER — SODIUM CHLORIDE 0.9 % IV SOLN: INTRAVENOUS | Status: DC

## 2019-06-01 MED ORDER — ACETAMINOPHEN 650 MG RE SUPP: 650.0000 mg | Freq: Four times a day (QID) | RECTAL | Status: DC | PRN

## 2019-06-01 MED ORDER — INSULIN GLARGINE 100 UNIT/ML ~~LOC~~ SOLN
20.0000 [IU] | Freq: Every day | SUBCUTANEOUS | Status: DC
  Administered 2019-06-02 (×2): 20 [IU] via SUBCUTANEOUS
  Filled 2019-06-01 (×3): qty 0.2

## 2019-06-01 MED ORDER — HEPARIN (PORCINE) 25000 UT/250ML-% IV SOLN: 10.0000 [IU]/kg/h | INTRAVENOUS | Status: DC

## 2019-06-01 MED ORDER — IOHEXOL 350 MG/ML SOLN
75.0000 mL | Freq: Once | INTRAVENOUS | Status: AC | PRN
  Administered 2019-06-01: 75 mL via INTRAVENOUS

## 2019-06-01 MED ORDER — SODIUM CHLORIDE 0.9% FLUSH
3.0000 mL | Freq: Two times a day (BID) | INTRAVENOUS | Status: DC
  Administered 2019-06-02 – 2019-06-05 (×8): 3 mL via INTRAVENOUS

## 2019-06-01 MED ORDER — INSULIN ASPART 100 UNIT/ML ~~LOC~~ SOLN
0.0000 [IU] | SUBCUTANEOUS | Status: DC
  Administered 2019-06-02 – 2019-06-03 (×2): 2 [IU] via SUBCUTANEOUS

## 2019-06-01 MED ORDER — HEPARIN SODIUM (PORCINE) 5000 UNIT/ML IJ SOLN
4000.0000 [IU] | Freq: Once | INTRAMUSCULAR | Status: AC
  Administered 2019-06-01: 4000 [IU] via INTRAVENOUS
  Filled 2019-06-01: qty 1

## 2019-06-01 MED ORDER — ACETAMINOPHEN 325 MG PO TABS: 650.0000 mg | ORAL_TABLET | Freq: Four times a day (QID) | ORAL | Status: DC | PRN

## 2019-06-01 MED ORDER — HEPARIN (PORCINE) 25000 UT/250ML-% IV SOLN
1250.0000 [IU]/h | INTRAVENOUS | Status: DC
  Administered 2019-06-01: 1300 [IU]/h via INTRAVENOUS
  Filled 2019-06-01 (×3): qty 250

## 2019-06-01 MED ORDER — ONDANSETRON HCL 4 MG/2ML IJ SOLN: 4.0000 mg | Freq: Four times a day (QID) | INTRAMUSCULAR | Status: DC | PRN

## 2019-06-01 MED ORDER — STROKE: EARLY STAGES OF RECOVERY BOOK
Freq: Once | Status: AC
  Administered 2019-06-02: 01:00:00
  Filled 2019-06-01: qty 1

## 2019-06-01 NOTE — ED Provider Notes (Signed)
[facility] EMERGENCY DEPARTMENT Provider Note   CSN: [id number] Arrival date & time: 06/01/19  1851     History   Chief Complaint Chief Complaint  Patient presents with   Altered Mental Status    HPI [name] is a 85 y.o. male.     The history is provided by the EMS personnel and a relative.  Altered Mental Status  Patient recently presented on Monday, 10/26, for sundialing and combativeness.  He received Haldol and was given a prescription for Haldol.  Per patient's daughter, patient has been very "out of it"since starting the Haldol.  She gave him 1 pill on Monday, and a half a pill on Tuesday.  She has not given him any other doses beyond that.  She reports that he fell a couple times on the floor and hit his head, but he did not pass out.  Today, patient apparently acutely was unable to raise his right arm and leg around 1-2 PM.  Patient's daughter called his PCP, who recommended he come to the emergency department for further evaluation and management.  Daughter denies any history of aspiration pneumonia, blood thinner use, admits to baby aspirin use daily.  Nothing seems to make symptoms better or worse.  Past Medical History:  Diagnosis Date   Arthritis    CAD (coronary artery disease)    s/p DES to LAD 06/2013   Depression    Dyslipidemia    Glaucoma    Hyperglycemia 04/17/2014   Hyperlipidemia    Hypertension    Stroke ([place])    Type 2 diabetes mellitus ([place])     Patient Active Problem List   Diagnosis Date Noted   Pulmonary embolism ([place]) 06/01/2019   Acute renal failure superimposed on stage 3 chronic kidney disease ([place]) 06/01/2019   Chronic diastolic CHF (congestive heart failure) ([place]) 06/01/2019   Pain due to onychomycosis of toenails of both feet 01/23/2019   Bilateral lower extremity edema 07/05/2017   Pulmonary nodules 04/13/2017   Centrilobular emphysema ([place]) 04/13/2017   Calculus of gallbladder without  cholecystitis without obstruction 04/13/2017   Chronic cough 04/13/2017   Adenoma of left adrenal gland 04/13/2017   Frequent falls 04/01/2017   Type 2 diabetes mellitus with stage 3 chronic kidney disease, with long-term current use of insulin ([place]) 04/01/2017   History of tobacco abuse 04/01/2017   Unsteady gait 04/01/2017   Dysarthria 04/01/2017   History of stroke    Hyperlipidemia    Moderate dementia with behavioral disturbance ([place]) 09/19/2015   Depression 09/19/2015   Hypertensive heart disease 09/10/2015   Type 2 diabetes, controlled, with peripheral circulatory disorder ([place])    Essential hypertension    Coronary artery disease involving native coronary artery of native heart with angina pectoris ([place])    Arthritis    Glaucoma     Past Surgical History:  Procedure Laterality Date   CARDIAC CATHETERIZATION  06/2013   DES to LAD by Dr Tamala Julian   Right Cataract          Home Medications    Prior to Admission medications   Medication Sig Start Date End Date Taking? Authorizing Provider  albuterol (PROVENTIL HFA;VENTOLIN HFA) 108 (90 Base) MCG/ACT inhaler INHALE TWO PUFFS EVERY 4-6 HOURS AS NEEDED FOR DIFFICULTY BREATHING,WHEEZING OR COUGH 08/31/18   Ngetich, Dinah C, NP  Alcohol Swabs (B-D SINGLE USE SWABS REGULAR) PADS USE AS DIRECTED WITH CHECKING BLOOD SUGAR THREE TIMES DAILY 09/07/17   Gildardo Cranker, DO  amLODipine ([place])  5 MG tablet Take 1 tablet (5 mg total) by mouth daily. 08/31/18   Ngetich, Dinah C, NP  aspirin EC 81 MG tablet Take 1 tablet (81 mg total) by mouth daily. 06/22/13   Rowe Clack, MD  Blood Glucose Calibration (ACCU-CHEK AVIVA) SOLN Use as Directed. Dx: E11.51 04/20/16   Gildardo Cranker, DO  escitalopram (LEXAPRO) 5 MG tablet TAKE 1 TABLET EVERY DAY 02/13/19   Ngetich, Dinah C, NP  glucose blood (ACCU-CHEK AVIVA) test strip Use to test blood sugar three times daily. Dx: E11.51 08/31/18   Ngetich, Dinah C, NP  haloperidol  (HALDOL) 5 MG tablet Take 1 tablet (5 mg total) by mouth every 6 (six) hours as needed for agitation. Give at dinnertime and at bedtime [id number]   Delora Fuel, MD  hydrochlorothiazide (HYDRODIURIL) 25 MG tablet TAKE 1 TABLET (25 MG TOTAL) BY MOUTH DAILY. 02/13/19   Ngetich, Dinah C, NP  Insulin Glargine, 1 Unit Dial, (TOUJEO SOLOSTAR) 300 UNIT/ML SOPN Inject 65 Units into the skin daily. 11/06/18   Ngetich, Dinah C, NP  Insulin Pen Needle (B-D UF III MINI PEN NEEDLES) 31G X 5 MM MISC USE AS DIRECTED. 08/31/18   Ngetich, Dinah C, NP  Lancet Devices (ACCU-CHEK SOFTCLIX) lancets Use as instructed with checking blood sugar three times daily. Dx: E11.51 04/01/17   Gildardo Cranker, DO  Loratadine 10 MG CAPS Take 1 capsule (10 mg total) by mouth daily. 02/13/19   Ngetich, Dinah C, NP  losartan (COZAAR) 50 MG tablet TAKE 1 TABLET (50 MG TOTAL) BY MOUTH DAILY. 02/13/19   Ngetich, Dinah C, NP  mineral oil-hydrophilic petrolatum (AQUAPHOR) ointment Apply topically 2 (two) times daily. To legs 04/11/19   Ngetich, Dinah C, NP  Multiple Vitamins-Minerals (SENTRY SENIOR) TABS Take 1 tablet by mouth daily.    [provider]  NAMZARIC 28-10 MG CP24 TAKE 1 CAPSULE EVERY DAY 02/13/19   Ngetich, Dinah C, NP  simvastatin (ZOCOR) 20 MG tablet TAKE 1 TABLET (20 MG TOTAL) BY MOUTH DAILY. 02/13/19   Ngetich, Dinah C, NP  tamsulosin (FLOMAX) 0.4 MG CAPS capsule Take 0.4 mg by mouth daily after supper.    [provider]  torsemide (DEMADEX) 20 MG tablet Take 1 tablet (20 mg total) by mouth daily as needed. 05/22/19   Reed, Tiffany L, DO  triamcinolone cream (KENALOG) 0.1 % APPLY 1 APPLICATION OF CREAM TWICE DAILY FOR 14 DAYS TO LOWER EXTRMITIES 05/28/19   Gayland Curry, DO    Family History Family History  Problem Relation Age of Onset   Arthritis Mother    Breast cancer Mother    Arthritis Father    Dementia Father    Diabetes Other    Arthritis Other    Colon cancer Other    Stroke Other     Diabetes type II Sister    Suicidality Brother    Diabetes type I Brother    Diabetes type I Brother    Diabetes type I Brother    Diabetes type II Sister    Breast cancer Daughter    Emphysema Daughter     Social History Social History   Tobacco Use   Smoking status: Former Smoker    Types: Cigarettes    Quit date: 08/02/1968    Years since quitting: 50.8   Smokeless tobacco: Never Used  Substance Use Topics   Alcohol use: No    Alcohol/week: 0.0 standard drinks   Drug use: No     Allergies  Lisinopril   Review of Systems Review of Systems  Unable to perform ROS: Dementia     Physical Exam Updated Vital Signs BP (!) 147/76 (BP Location: Right Arm)    Pulse 100    Temp 98.7 F (37.1 C) (Oral)    Resp 14    SpO2 94%   Physical Exam Vitals signs and nursing note reviewed.  Constitutional:      Appearance: He is well-developed.     Comments: Sleepy, garbled speech from his dementia  HENT:     Head: Normocephalic and atraumatic.  Eyes:     Conjunctiva/sclera: Conjunctivae normal.  Neck:     Musculoskeletal: Neck supple.  Cardiovascular:     Rate and Rhythm: Normal rate and regular rhythm.     Heart sounds: No murmur.  Pulmonary:     Effort: Pulmonary effort is normal. No respiratory distress.     Breath sounds: Normal breath sounds.     Comments: On 2 L nasal cannula, sleepy, able to speak and protecting airway currently Abdominal:     Palpations: Abdomen is soft.     Tenderness: There is no abdominal tenderness.  Musculoskeletal:     Right lower leg: No edema.     Left lower leg: No edema.  Skin:    General: Skin is warm and dry.  Neurological:     Mental Status: He is disoriented.     Motor: Weakness present.     Comments: Right-sided upper and lower extremity weakness, unable to keep right leg elevated while he is able to do so for the left leg.  Patient appears very sleepy, but he responds to his name and gives garbled speech that is  apparently his baseline for his dementia      ED Treatments / Results  Labs (all labs ordered are listed, but only abnormal results are displayed) Labs Reviewed  CBC WITH DIFFERENTIAL/PLATELET - Abnormal; Notable for the following components:      Result Value   RBC 3.77 (*)    Hemoglobin 11.7 (*)    HCT 35.5 (*)    Monocytes Absolute 1.2 (*)    All other components within normal limits  COMPREHENSIVE METABOLIC PANEL - Abnormal; Notable for the following components:   Glucose, Bld 112 (*)    BUN 29 (*)    Creatinine, Ser 2.08 (*)    AST 166 (*)    ALT 67 (*)    Total Bilirubin 1.7 (*)    GFR calc non Af Amer 26 (*)    GFR calc Af Amer 30 (*)    All other components within normal limits  I-STAT CHEM 8, ED - Abnormal; Notable for the following components:   BUN 29 (*)    Creatinine, Ser 2.00 (*)    Hemoglobin 10.9 (*)    HCT 32.0 (*)    All other components within normal limits  LACTIC ACID, PLASMA  HEPARIN LEVEL (UNFRACTIONATED)  CBC  BRAIN NATRIURETIC PEPTIDE  CBG MONITORING, ED  CBG MONITORING, ED  TROPONIN I (HIGH SENSITIVITY)    EKG EKG Interpretation  Date/Time:  Friday June 01 2019 18:56:01 EDT Ventricular Rate:  104 PR Interval:    QRS Duration: 133 QT Interval:  396 QTC Calculation: 521 R Axis:   96 Text Interpretation: Sinus tachycardia Right bundle branch block ST elevation, consider inferior injury Interpretation limited secondary to artifact Confirmed by Theotis Burrow [phone]) on 06/01/2019 7:40:45 PM   Radiology Ct Angio Head W Or Wo Contrast  Result  Date: 06/01/2019 CLINICAL DATA:  Progressive confusion over the last week. Right-sided weakness for 4 days. EXAM: CT ANGIOGRAPHY HEAD AND NECK TECHNIQUE: Multidetector CT imaging of the head and neck was performed using the standard protocol during bolus administration of intravenous contrast. Multiplanar CT image reconstructions and MIPs were obtained to evaluate the vascular anatomy. Carotid  stenosis measurements (when applicable) are obtained utilizing NASCET criteria, using the distal internal carotid diameter as the denominator. CONTRAST:  58mL OMNIPAQUE IOHEXOL 350 MG/ML SOLN COMPARISON:  CT head without contrast 06/01/2019. CT chest 01/18/2019 FINDINGS: CTA NECK FINDINGS Aortic arch: Extensive atherosclerotic calcifications are present in the aortic arch and proximal descending thoracic aorta. There is no aneurysm. Calcifications are greatest at the origin of the left subclavian artery. No significant stenosis is present in the great vessel origins. Right carotid system: The right common carotid artery is within normal limits. The bifurcation is unremarkable. The cervical right ICA is normal. Left carotid system: The left common carotid artery is within normal limits. Atherosclerotic calcifications are present at the bifurcation without a significant stenosis. The more distal cervical left ICA is otherwise normal. Vertebral arteries: The vertebral arteries are codominant. Both vertebral arteries originate from the subclavian arteries. Atherosclerotic calcifications are present at the origin of the right vertebral artery with a 50% stenosis. There is no significant stenosis of either vertebral artery in the neck otherwise. Skeleton: Extensive degenerative changes are present within the cervical spine. Vertebral body heights are maintained. Calcification posterior longitudinal ligament at C3-4 and C4-5 contributes to moderate central canal stenosis at these levels. Multilevel foraminal disease is due to uncovertebral disease. No focal lytic or blastic lesions are present. The patient is edentulous. Other neck: The salivary glands and ducts. Are within No significant cervical adenopathy normal limits bilaterally is present. No focal mucosal or submucosal lesions are present. Upper chest: Centrilobular emphysematous changes are present. Mild dependent atelectasis is noted. Right inferior pulmonary  embolus is noted. There is embolus extending into the left lower lobe pulmonary embolus is well. These are incompletely imaged. Right infrahilar soft tissue mass measures 2.2 x 1.4 cm. Right infrahilar adenopathy is present as well. 5 mm nodule is present posteriorly in the right upper lobe. Other smaller nodules are present in the right upper lobe as well. Review of the MIP images confirms the above findings CTA HEAD FINDINGS Anterior circulation: Atherosclerotic calcifications are present within the cavernous internal carotid arteries bilaterally without a significant stenosis relative to the more distal vessel. ICA termini are normal. The A1 and M1 segments are normal. The anterior communicating artery is patent. MCA bifurcations are intact. ACA and MCA branch vessels are normal. Posterior circulation: The vertebral arteries are codominant. PICA origins are visualized and normal. Vertebrobasilar junction is normal. Both posterior cerebral arteries originate from basilar tip. The PCA branch vessels are within normal limits. Venous sinuses: The dural sinuses are patent. The straight sinus deep cerebral veins are intact. Cortical veins are unremarkable. Anatomic variants: None Review of the MIP images confirms the above findings IMPRESSION: 1. Bilateral lower lobe pulmonary emboli. 2. 50% stenosis at the origin of the right vertebral artery. 3. Atherosclerotic changes at the carotid bifurcations bilaterally without significant stenosis. 4. No significant proximal stenosis, aneurysm, or branch vessel occlusion within the Circle of Willis. 5. Extensive atherosclerotic changes within the aortic arch and proximal descending thoracic aorta without aneurysm. 6. Multilevel spondylosis of the cervical spine with a calcified posterior longitudinal ligament at C3-4 and C4-5 resulting in moderate central canal stenosis  at these levels. Electronically Signed   By: San Morelle M.D.   On: 06/01/2019 21:00   Ct Head Wo  Contrast  Result Date: 06/01/2019 CLINICAL DATA:  Focal neuro deficit for greater than 6 hours, increasing confusion and combativeness for 1 week EXAM: CT HEAD WITHOUT CONTRAST TECHNIQUE: Contiguous axial images were obtained from the base of the skull through the vertex without intravenous contrast. COMPARISON:  CT 04/06/2017 FINDINGS: Brain: Stable regions of gliosis in the right centrum semiovale, left frontal pole and left occipital lobe compatible with remote infarct. No evidence of acute infarction, hemorrhage, or hydrocephalus. Remote right parietal hygroma/chronic subdural has since resolved from comparison exam. No visible extra-axial collection or mass lesion/mass effect. Symmetric prominence of the ventricles, cisterns and sulci compatible with parenchymal volume loss. Patchy areas of white matter hypoattenuation are most compatible with chronic microvascular angiopathy. Vascular: Atherosclerotic calcification of the carotid siphons and intradural vertebral arteries. No hyperdense vessel. Skull: No calvarial fracture or suspicious osseous lesion. No scalp swelling or hematoma. Sinuses/Orbits: Mural thickening in the anterior ethmoids and right maxillary sinus. No air-fluid levels. Orbital structures are unremarkable aside from prior lens extractions. Other: Edentulous with mandibular prognathism temporomandibular joints remain normally aligned. IMPRESSION: 1. No acute intracranial abnormality. If persisting concern for infarct, MRI is more sensitive and specific. 2. Stable remote infarcts in the right and left frontal and left occipital lobes. 3. Remote right parietal hygroma/chronic subdural has since resolved from comparison exam. 4. Chronic microvascular ischemic changes and parenchymal volume loss. Electronically Signed   By: Lovena Le M.D.   On: 06/01/2019 19:55   Ct Angio Neck W And/or Wo Contrast  Result Date: 06/01/2019 CLINICAL DATA:  Progressive confusion over the last week.  Right-sided weakness for 4 days. EXAM: CT ANGIOGRAPHY HEAD AND NECK TECHNIQUE: Multidetector CT imaging of the head and neck was performed using the standard protocol during bolus administration of intravenous contrast. Multiplanar CT image reconstructions and MIPs were obtained to evaluate the vascular anatomy. Carotid stenosis measurements (when applicable) are obtained utilizing NASCET criteria, using the distal internal carotid diameter as the denominator. CONTRAST:  61mL OMNIPAQUE IOHEXOL 350 MG/ML SOLN COMPARISON:  CT head without contrast 06/01/2019. CT chest 01/18/2019 FINDINGS: CTA NECK FINDINGS Aortic arch: Extensive atherosclerotic calcifications are present in the aortic arch and proximal descending thoracic aorta. There is no aneurysm. Calcifications are greatest at the origin of the left subclavian artery. No significant stenosis is present in the great vessel origins. Right carotid system: The right common carotid artery is within normal limits. The bifurcation is unremarkable. The cervical right ICA is normal. Left carotid system: The left common carotid artery is within normal limits. Atherosclerotic calcifications are present at the bifurcation without a significant stenosis. The more distal cervical left ICA is otherwise normal. Vertebral arteries: The vertebral arteries are codominant. Both vertebral arteries originate from the subclavian arteries. Atherosclerotic calcifications are present at the origin of the right vertebral artery with a 50% stenosis. There is no significant stenosis of either vertebral artery in the neck otherwise. Skeleton: Extensive degenerative changes are present within the cervical spine. Vertebral body heights are maintained. Calcification posterior longitudinal ligament at C3-4 and C4-5 contributes to moderate central canal stenosis at these levels. Multilevel foraminal disease is due to uncovertebral disease. No focal lytic or blastic lesions are present. The patient  is edentulous. Other neck: The salivary glands and ducts. Are within No significant cervical adenopathy normal limits bilaterally is present. No focal mucosal or submucosal lesions are  present. Upper chest: Centrilobular emphysematous changes are present. Mild dependent atelectasis is noted. Right inferior pulmonary embolus is noted. There is embolus extending into the left lower lobe pulmonary embolus is well. These are incompletely imaged. Right infrahilar soft tissue mass measures 2.2 x 1.4 cm. Right infrahilar adenopathy is present as well. 5 mm nodule is present posteriorly in the right upper lobe. Other smaller nodules are present in the right upper lobe as well. Review of the MIP images confirms the above findings CTA HEAD FINDINGS Anterior circulation: Atherosclerotic calcifications are present within the cavernous internal carotid arteries bilaterally without a significant stenosis relative to the more distal vessel. ICA termini are normal. The A1 and M1 segments are normal. The anterior communicating artery is patent. MCA bifurcations are intact. ACA and MCA branch vessels are normal. Posterior circulation: The vertebral arteries are codominant. PICA origins are visualized and normal. Vertebrobasilar junction is normal. Both posterior cerebral arteries originate from basilar tip. The PCA branch vessels are within normal limits. Venous sinuses: The dural sinuses are patent. The straight sinus deep cerebral veins are intact. Cortical veins are unremarkable. Anatomic variants: None Review of the MIP images confirms the above findings IMPRESSION: 1. Bilateral lower lobe pulmonary emboli. 2. 50% stenosis at the origin of the right vertebral artery. 3. Atherosclerotic changes at the carotid bifurcations bilaterally without significant stenosis. 4. No significant proximal stenosis, aneurysm, or branch vessel occlusion within the Circle of Willis. 5. Extensive atherosclerotic changes within the aortic arch and  proximal descending thoracic aorta without aneurysm. 6. Multilevel spondylosis of the cervical spine with a calcified posterior longitudinal ligament at C3-4 and C4-5 resulting in moderate central canal stenosis at these levels. Electronically Signed   By: San Morelle M.D.   On: 06/01/2019 21:00    Procedures Procedures (including critical care time)  Medications Ordered in ED Medications  heparin ADULT infusion 100 units/mL (25000 units/292mL sodium chloride 0.45%) (1,300 Units/hr Intravenous New Bag/Given 06/01/19 2132)  iohexol (OMNIPAQUE) 350 MG/ML injection 75 mL (75 mLs Intravenous Contrast Given 06/01/19 1952)  heparin injection 4,000 Units (4,000 Units Intravenous Given 06/01/19 2135)     Initial Impression / Assessment and Plan / ED Course  I have reviewed the triage vital signs and the nursing notes.  Pertinent labs & imaging results that were available during my care of the patient were reviewed by me and considered in my medical decision making (see chart for details).        [name] is a 85 y.o. male with a past medical history of depression, hyperlipidemia, hypertension, previous stroke, type 2 diabetes, coronary artery disease not on anticoagulation presenting today for general altered mental status over the last week since Monday and acute inability to lift his right arm and leg since 1-2 PM.  Patient was protecting his airway on my evaluation, on 2 L nasal cannula.  Code stroke called, labs ordered.    Differential diagnosis includes TIA, acute stroke, electrolyte abnormality, sepsis.  Of note, daughter is very concerned that patient has been acting strange since Haldol initiation on Monday, 10/26, but last dose of Haldol was on Tuesday which would have metabolized by now.  Labs showed AKI, initial CT head does not show any acute abnormality, neurology recommends a CTA head and neck.  CTA neck showed bilateral PE.  Per conversation with daughter, full code  at this time.  Heparin initiated, patient admitted to hospital service for further evaluation and management.  Neurology has not given final recommendations at  this time.  Patient has still not recovered use of his right upper and lower extremities.  Care of patient was discussed with the supervising attending.  Final Clinical Impressions(s) / ED Diagnoses   Final diagnoses:  AKI (acute kidney injury) ([place])  Acute pulmonary embolism, unspecified pulmonary embolism type, unspecified whether acute cor pulmonale present [facility])  Neurologic abnormality    ED Discharge Orders    None       Julianne Rice, MD 06/01/19 2152    Rex Kras Wenda Overland, MD 06/05/19 [phone]

## 2019-06-01 NOTE — Progress Notes (Signed)
ANTICOAGULATION CONSULT NOTE - Initial Consult  Pharmacy Consult:  Heparin Indication: pulmonary embolus  Allergies  Allergen Reactions  . Lisinopril Cough    Patient Measurements:   Heparin Dosing Weight: 81 kg  Vital Signs: Temp: 98.7 F (37.1 C) (10/30 1856) Temp Source: Oral (10/30 1856) BP: 147/76 (10/30 1920) Pulse Rate: 100 (10/30 1856)  Labs: Recent Labs    06/01/19 1921 06/01/19 2111  HGB 11.7* 10.9*  HCT 35.5* 32.0*  PLT PLATELET CLUMPS NOTED ON SMEAR, UNABLE TO ESTIMATE  --   CREATININE 2.08* 2.00*    Estimated Creatinine Clearance: 23.5 mL/min (A) (by C-G formula based on SCr of 2 mg/dL (H)).   Medical History: Past Medical History:  Diagnosis Date  . Arthritis   . CAD (coronary artery disease)    s/p DES to LAD 06/2013  . Depression   . Dyslipidemia   . Glaucoma   . Hyperglycemia 04/17/2014  . Hyperlipidemia   . Hypertension   . Stroke ([place])   . Type 2 diabetes mellitus ([facility])     Assessment: 89 YOM presented with AMS and code stroke was called initially, now canceled.  Found to have acute PE and Pharmacy consulted to dose IV heparin.  Baseline labs reviewed.  Goal of Therapy:  Heparin level 0.3-0.7 units/ml Monitor platelets by anticoagulation protocol: Yes   Plan:  Heparin 4000 units IV bolus per MD, then Heparin gtt at 1300 units/hr Check 8 hr heparin level Daily heparin level and CBC  Letasha Kershaw D. Mina Marble, PharmD, BCPS, [place] 06/01/2019, 9:30 PM

## 2019-06-01 NOTE — H&P (Signed)
History and Physical    [name] [id number] DOB: 26-Aug-1921 DOA: 06/01/2019  PCP: Gayland Curry, DO   Patient coming from: Home   Chief Complaint: Somnolence, weakness   HPI: [name] is a 85 y.o. male with medical history significant for coronary artery disease, history of CVA, insulin-dependent diabetes mellitus, hypertension, chronic kidney disease stage III and depression with behavioral disturbance, now presenting to the emergency department for evaluation of somnolence and weakness.  Patient was started on Haldol a few days ago and has reportedly been very somnolent since then with multiple falls.  Patient's daughter was concerned that his right arm and leg were weaker than the left at approximately 1 PM today.  Daughter was able to arrange a telemedicine visit with the patient's PCP who recommended further evaluation in the emergency department.  Patient is unable to contribute to the history due to his clinical condition.  ED Course: Upon arrival to the ED, patient is found to be afebrile, saturating mid 90s on room air, and slightly tachycardic.  EKG features sinus tachycardia with rate 104, RBBB, and nonspecific ST-T abnormalities.  Noncontrast head CT is negative for acute findings.  CTA head and neck is concerning for bilateral lower lobe PE.  Chemistry panel features a creatinine of 2.08, up from 1.49 four days earlier.  CBC features a mild normocytic anemia and clumped platelets.  Neurology was consulted by the ED physician and the patient was started on IV heparin infusion.  Review of Systems:  All other systems reviewed and apart from HPI, are negative.  Past Medical History:  Diagnosis Date   Arthritis    CAD (coronary artery disease)    s/p DES to LAD 06/2013   Depression    Dyslipidemia    Glaucoma    Hyperglycemia 04/17/2014   Hyperlipidemia    Hypertension    Stroke [facility])    Type 2 diabetes mellitus [facility])     Past Surgical History:   Procedure Laterality Date   CARDIAC CATHETERIZATION  06/2013   DES to LAD by Dr Tamala Julian   Right Cataract       reports that he quit smoking about 50 years ago. His smoking use included cigarettes. He has never used smokeless tobacco. He reports that he does not drink alcohol or use drugs.  Allergies  Allergen Reactions   Lisinopril Cough    Family History  Problem Relation Age of Onset   Arthritis Mother    Breast cancer Mother    Arthritis Father    Dementia Father    Diabetes Other    Arthritis Other    Colon cancer Other    Stroke Other    Diabetes type II Sister    Suicidality Brother    Diabetes type I Brother    Diabetes type I Brother    Diabetes type I Brother    Diabetes type II Sister    Breast cancer Daughter    Emphysema Daughter      Prior to Admission medications   Medication Sig Start Date End Date Taking? Authorizing Provider  albuterol (PROVENTIL HFA;VENTOLIN HFA) 108 (90 Base) MCG/ACT inhaler INHALE TWO PUFFS EVERY 4-6 HOURS AS NEEDED FOR DIFFICULTY BREATHING,WHEEZING OR COUGH 08/31/18   Ngetich, Dinah C, NP  Alcohol Swabs (B-D SINGLE USE SWABS REGULAR) PADS USE AS DIRECTED WITH CHECKING BLOOD SUGAR THREE TIMES DAILY 09/07/17   Gildardo Cranker, DO  amLODipine (NORVASC) 5 MG tablet Take 1 tablet (5 mg total) by mouth daily. 08/31/18  Ngetich, Dinah C, NP  aspirin EC 81 MG tablet Take 1 tablet (81 mg total) by mouth daily. 06/22/13   Rowe Clack, MD  Blood Glucose Calibration (ACCU-CHEK AVIVA) SOLN Use as Directed. Dx: E11.51 04/20/16   Gildardo Cranker, DO  escitalopram (LEXAPRO) 5 MG tablet TAKE 1 TABLET EVERY DAY 02/13/19   Ngetich, Dinah C, NP  glucose blood (ACCU-CHEK AVIVA) test strip Use to test blood sugar three times daily. Dx: E11.51 08/31/18   Ngetich, Dinah C, NP  haloperidol (HALDOL) 5 MG tablet Take 1 tablet (5 mg total) by mouth every 6 (six) hours as needed for agitation. Give at dinnertime and at bedtime [id number]   Delora Fuel, MD  hydrochlorothiazide (HYDRODIURIL) 25 MG tablet TAKE 1 TABLET (25 MG TOTAL) BY MOUTH DAILY. 02/13/19   Ngetich, Dinah C, NP  Insulin Glargine, 1 Unit Dial, (TOUJEO SOLOSTAR) 300 UNIT/ML SOPN Inject 65 Units into the skin daily. 11/06/18   Ngetich, Dinah C, NP  Insulin Pen Needle (B-D UF III MINI PEN NEEDLES) 31G X 5 MM MISC USE AS DIRECTED. 08/31/18   Ngetich, Dinah C, NP  Lancet Devices (ACCU-CHEK SOFTCLIX) lancets Use as instructed with checking blood sugar three times daily. Dx: E11.51 04/01/17   Gildardo Cranker, DO  Loratadine 10 MG CAPS Take 1 capsule (10 mg total) by mouth daily. 02/13/19   Ngetich, Dinah C, NP  losartan (COZAAR) 50 MG tablet TAKE 1 TABLET (50 MG TOTAL) BY MOUTH DAILY. 02/13/19   Ngetich, Dinah C, NP  mineral oil-hydrophilic petrolatum (AQUAPHOR) ointment Apply topically 2 (two) times daily. To legs 04/11/19   Ngetich, Dinah C, NP  Multiple Vitamins-Minerals (SENTRY SENIOR) TABS Take 1 tablet by mouth daily.    [provider]  NAMZARIC 28-10 MG CP24 TAKE 1 CAPSULE EVERY DAY 02/13/19   Ngetich, Dinah C, NP  simvastatin (ZOCOR) 20 MG tablet TAKE 1 TABLET (20 MG TOTAL) BY MOUTH DAILY. 02/13/19   Ngetich, Dinah C, NP  tamsulosin (FLOMAX) 0.4 MG CAPS capsule Take 0.4 mg by mouth daily after supper.    [provider]  torsemide (DEMADEX) 20 MG tablet Take 1 tablet (20 mg total) by mouth daily as needed. 05/22/19   Reed, Tiffany L, DO  triamcinolone cream (KENALOG) 0.1 % APPLY 1 APPLICATION OF CREAM TWICE DAILY FOR 14 DAYS TO LOWER EXTRMITIES 05/28/19   Gayland Curry, DO    Physical Exam: Vitals:   06/01/19 1856 06/01/19 1920  BP:  (!) 147/76  Pulse: 100   Resp: 14   Temp: 98.7 F (37.1 C)   TempSrc: Oral   SpO2: 94%     Constitutional: NAD, calm  Eyes: PERTLA, lids and conjunctivae normal ENMT: Mucous membranes are moist. Posterior pharynx clear of any exudate or lesions.   Neck: normal, supple, no masses, no thyromegaly Respiratory: no wheezing,  no crackles. Normal respiratory effort. No accessory muscle use.  Cardiovascular: S1 & S2 heard, regular rate and rhythm. No extremity edema.  Abdomen: No distension, no tenderness, soft. Bowel sounds normal.  Musculoskeletal: no clubbing / cyanosis. No joint deformity upper and lower extremities.    Skin: Abrasion to right temple. Warm, dry, well-perfused. Neurologic: No gross facial asymmetry. PERRL. Gross hearing deficit. Sensation intact. Strength 4/5 involving LUE, 3/5 involving RUE and bilateral LE's.  Psychiatric: Somnolent, easily woken. Not answering questions.    Labs on Admission: I have personally reviewed following labs and imaging studies  CBC: Recent Labs  Lab 05/28/19 0600 06/01/19 1921 06/01/19 2111  WBC 4.0 8.1  --   NEUTROABS 2.2 5.0  --   HGB 10.4* 11.7* 10.9*  HCT 33.3* 35.5* 32.0*  MCV 97.9 94.2  --   PLT 173 PLATELET CLUMPS NOTED ON SMEAR, UNABLE TO ESTIMATE  --    Basic Metabolic Panel: Recent Labs  Lab 05/28/19 0433 06/01/19 1921 06/01/19 2111  NA 141 141 141  K 4.3 4.8 4.0  CL 104 100 99  CO2 24 29  --   GLUCOSE 149* 112* 97  BUN 18 29* 29*  CREATININE 1.49* 2.08* 2.00*  CALCIUM 9.2 9.6  --    GFR: Estimated Creatinine Clearance: 23.5 mL/min (A) (by C-G formula based on SCr of 2 mg/dL (H)). Liver Function Tests: Recent Labs  Lab 05/28/19 0433 06/01/19 1921  AST 41 166*  ALT 29 67*  ALKPHOS 61 50  BILITOT 0.5 1.7*  PROT 6.5 6.6  ALBUMIN 3.8 3.6   No results for input(s): LIPASE, AMYLASE in the last 168 hours. No results for input(s): AMMONIA in the last 168 hours. Coagulation Profile: No results for input(s): INR, PROTIME in the last 168 hours. Cardiac Enzymes: No results for input(s): CKTOTAL, CKMB, CKMBINDEX, TROPONINI in the last 168 hours. BNP (last 3 results) No results for input(s): PROBNP in the last 8760 hours. HbA1C: No results for input(s): HGBA1C in the last 72 hours. CBG: Recent Labs  Lab 06/01/19 1902  06/01/19 1951  GLUCAP 98 89   Lipid Profile: No results for input(s): CHOL, HDL, LDLCALC, TRIG, CHOLHDL, LDLDIRECT in the last 72 hours. Thyroid Function Tests: No results for input(s): TSH, T4TOTAL, FREET4, T3FREE, THYROIDAB in the last 72 hours. Anemia Panel: No results for input(s): VITAMINB12, FOLATE, FERRITIN, TIBC, IRON, RETICCTPCT in the last 72 hours. Urine analysis:    Component Value Date/Time   COLORURINE YELLOW 05/28/2019 0640   APPEARANCEUR CLEAR 05/28/2019 0640   LABSPEC 1.012 05/28/2019 0640   PHURINE 5.0 05/28/2019 0640   GLUCOSEU NEGATIVE 05/28/2019 0640   HGBUR NEGATIVE 05/28/2019 0640   BILIRUBINUR NEGATIVE 05/28/2019 0640   KETONESUR NEGATIVE 05/28/2019 0640   PROTEINUR NEGATIVE 05/28/2019 0640   UROBILINOGEN 0.2 04/17/2014 1756   NITRITE NEGATIVE 05/28/2019 0640   LEUKOCYTESUR NEGATIVE 05/28/2019 0640   Sepsis Labs: @LABRCNTIP (procalcitonin:4,lacticidven:4) )No results found for this or any previous visit (from the past 240 hour(s)).   Radiological Exams on Admission: Ct Angio Head W Or Wo Contrast  Result Date: 06/01/2019 CLINICAL DATA:  Progressive confusion over the last week. Right-sided weakness for 4 days. EXAM: CT ANGIOGRAPHY HEAD AND NECK TECHNIQUE: Multidetector CT imaging of the head and neck was performed using the standard protocol during bolus administration of intravenous contrast. Multiplanar CT image reconstructions and MIPs were obtained to evaluate the vascular anatomy. Carotid stenosis measurements (when applicable) are obtained utilizing NASCET criteria, using the distal internal carotid diameter as the denominator. CONTRAST:  16mL OMNIPAQUE IOHEXOL 350 MG/ML SOLN COMPARISON:  CT head without contrast 06/01/2019. CT chest 01/18/2019 FINDINGS: CTA NECK FINDINGS Aortic arch: Extensive atherosclerotic calcifications are present in the aortic arch and proximal descending thoracic aorta. There is no aneurysm. Calcifications are greatest at the  origin of the left subclavian artery. No significant stenosis is present in the great vessel origins. Right carotid system: The right common carotid artery is within normal limits. The bifurcation is unremarkable. The cervical right ICA is normal. Left carotid system: The left common carotid artery is within normal limits. Atherosclerotic calcifications are present at the bifurcation without a significant stenosis. The more  distal cervical left ICA is otherwise normal. Vertebral arteries: The vertebral arteries are codominant. Both vertebral arteries originate from the subclavian arteries. Atherosclerotic calcifications are present at the origin of the right vertebral artery with a 50% stenosis. There is no significant stenosis of either vertebral artery in the neck otherwise. Skeleton: Extensive degenerative changes are present within the cervical spine. Vertebral body heights are maintained. Calcification posterior longitudinal ligament at C3-4 and C4-5 contributes to moderate central canal stenosis at these levels. Multilevel foraminal disease is due to uncovertebral disease. No focal lytic or blastic lesions are present. The patient is edentulous. Other neck: The salivary glands and ducts. Are within No significant cervical adenopathy normal limits bilaterally is present. No focal mucosal or submucosal lesions are present. Upper chest: Centrilobular emphysematous changes are present. Mild dependent atelectasis is noted. Right inferior pulmonary embolus is noted. There is embolus extending into the left lower lobe pulmonary embolus is well. These are incompletely imaged. Right infrahilar soft tissue mass measures 2.2 x 1.4 cm. Right infrahilar adenopathy is present as well. 5 mm nodule is present posteriorly in the right upper lobe. Other smaller nodules are present in the right upper lobe as well. Review of the MIP images confirms the above findings CTA HEAD FINDINGS Anterior circulation: Atherosclerotic  calcifications are present within the cavernous internal carotid arteries bilaterally without a significant stenosis relative to the more distal vessel. ICA termini are normal. The A1 and M1 segments are normal. The anterior communicating artery is patent. MCA bifurcations are intact. ACA and MCA branch vessels are normal. Posterior circulation: The vertebral arteries are codominant. PICA origins are visualized and normal. Vertebrobasilar junction is normal. Both posterior cerebral arteries originate from basilar tip. The PCA branch vessels are within normal limits. Venous sinuses: The dural sinuses are patent. The straight sinus deep cerebral veins are intact. Cortical veins are unremarkable. Anatomic variants: None Review of the MIP images confirms the above findings IMPRESSION: 1. Bilateral lower lobe pulmonary emboli. 2. 50% stenosis at the origin of the right vertebral artery. 3. Atherosclerotic changes at the carotid bifurcations bilaterally without significant stenosis. 4. No significant proximal stenosis, aneurysm, or branch vessel occlusion within the Circle of Willis. 5. Extensive atherosclerotic changes within the aortic arch and proximal descending thoracic aorta without aneurysm. 6. Multilevel spondylosis of the cervical spine with a calcified posterior longitudinal ligament at C3-4 and C4-5 resulting in moderate central canal stenosis at these levels. Electronically Signed   By: San Morelle M.D.   On: 06/01/2019 21:00   Ct Head Wo Contrast  Result Date: 06/01/2019 CLINICAL DATA:  Focal neuro deficit for greater than 6 hours, increasing confusion and combativeness for 1 week EXAM: CT HEAD WITHOUT CONTRAST TECHNIQUE: Contiguous axial images were obtained from the base of the skull through the vertex without intravenous contrast. COMPARISON:  CT 04/06/2017 FINDINGS: Brain: Stable regions of gliosis in the right centrum semiovale, left frontal pole and left occipital lobe compatible with  remote infarct. No evidence of acute infarction, hemorrhage, or hydrocephalus. Remote right parietal hygroma/chronic subdural has since resolved from comparison exam. No visible extra-axial collection or mass lesion/mass effect. Symmetric prominence of the ventricles, cisterns and sulci compatible with parenchymal volume loss. Patchy areas of white matter hypoattenuation are most compatible with chronic microvascular angiopathy. Vascular: Atherosclerotic calcification of the carotid siphons and intradural vertebral arteries. No hyperdense vessel. Skull: No calvarial fracture or suspicious osseous lesion. No scalp swelling or hematoma. Sinuses/Orbits: Mural thickening in the anterior ethmoids and right maxillary sinus.  No air-fluid levels. Orbital structures are unremarkable aside from prior lens extractions. Other: Edentulous with mandibular prognathism temporomandibular joints remain normally aligned. IMPRESSION: 1. No acute intracranial abnormality. If persisting concern for infarct, MRI is more sensitive and specific. 2. Stable remote infarcts in the right and left frontal and left occipital lobes. 3. Remote right parietal hygroma/chronic subdural has since resolved from comparison exam. 4. Chronic microvascular ischemic changes and parenchymal volume loss. Electronically Signed   By: Lovena Le M.D.   On: 06/01/2019 19:55   Ct Angio Neck W And/or Wo Contrast  Result Date: 06/01/2019 CLINICAL DATA:  Progressive confusion over the last week. Right-sided weakness for 4 days. EXAM: CT ANGIOGRAPHY HEAD AND NECK TECHNIQUE: Multidetector CT imaging of the head and neck was performed using the standard protocol during bolus administration of intravenous contrast. Multiplanar CT image reconstructions and MIPs were obtained to evaluate the vascular anatomy. Carotid stenosis measurements (when applicable) are obtained utilizing NASCET criteria, using the distal internal carotid diameter as the denominator. CONTRAST:   69mL OMNIPAQUE IOHEXOL 350 MG/ML SOLN COMPARISON:  CT head without contrast 06/01/2019. CT chest 01/18/2019 FINDINGS: CTA NECK FINDINGS Aortic arch: Extensive atherosclerotic calcifications are present in the aortic arch and proximal descending thoracic aorta. There is no aneurysm. Calcifications are greatest at the origin of the left subclavian artery. No significant stenosis is present in the great vessel origins. Right carotid system: The right common carotid artery is within normal limits. The bifurcation is unremarkable. The cervical right ICA is normal. Left carotid system: The left common carotid artery is within normal limits. Atherosclerotic calcifications are present at the bifurcation without a significant stenosis. The more distal cervical left ICA is otherwise normal. Vertebral arteries: The vertebral arteries are codominant. Both vertebral arteries originate from the subclavian arteries. Atherosclerotic calcifications are present at the origin of the right vertebral artery with a 50% stenosis. There is no significant stenosis of either vertebral artery in the neck otherwise. Skeleton: Extensive degenerative changes are present within the cervical spine. Vertebral body heights are maintained. Calcification posterior longitudinal ligament at C3-4 and C4-5 contributes to moderate central canal stenosis at these levels. Multilevel foraminal disease is due to uncovertebral disease. No focal lytic or blastic lesions are present. The patient is edentulous. Other neck: The salivary glands and ducts. Are within No significant cervical adenopathy normal limits bilaterally is present. No focal mucosal or submucosal lesions are present. Upper chest: Centrilobular emphysematous changes are present. Mild dependent atelectasis is noted. Right inferior pulmonary embolus is noted. There is embolus extending into the left lower lobe pulmonary embolus is well. These are incompletely imaged. Right infrahilar soft tissue  mass measures 2.2 x 1.4 cm. Right infrahilar adenopathy is present as well. 5 mm nodule is present posteriorly in the right upper lobe. Other smaller nodules are present in the right upper lobe as well. Review of the MIP images confirms the above findings CTA HEAD FINDINGS Anterior circulation: Atherosclerotic calcifications are present within the cavernous internal carotid arteries bilaterally without a significant stenosis relative to the more distal vessel. ICA termini are normal. The A1 and M1 segments are normal. The anterior communicating artery is patent. MCA bifurcations are intact. ACA and MCA branch vessels are normal. Posterior circulation: The vertebral arteries are codominant. PICA origins are visualized and normal. Vertebrobasilar junction is normal. Both posterior cerebral arteries originate from basilar tip. The PCA branch vessels are within normal limits. Venous sinuses: The dural sinuses are patent. The straight sinus deep cerebral veins are  intact. Cortical veins are unremarkable. Anatomic variants: None Review of the MIP images confirms the above findings IMPRESSION: 1. Bilateral lower lobe pulmonary emboli. 2. 50% stenosis at the origin of the right vertebral artery. 3. Atherosclerotic changes at the carotid bifurcations bilaterally without significant stenosis. 4. No significant proximal stenosis, aneurysm, or branch vessel occlusion within the Circle of Willis. 5. Extensive atherosclerotic changes within the aortic arch and proximal descending thoracic aorta without aneurysm. 6. Multilevel spondylosis of the cervical spine with a calcified posterior longitudinal ligament at C3-4 and C4-5 resulting in moderate central canal stenosis at these levels. Electronically Signed   By: San Morelle M.D.   On: 06/01/2019 21:00    EKG: Independently reviewed. Sinus tachycardia (rate 104), RBBB, non-specific ST-T abnormalities.   Assessment/Plan   1. Acute encephalopathy; weakness  -  Presents with several days of somnolence since he was started on Haldol and then right arm and leg weakness noted by his daughter several hours prior to arrival  - Head CT with no acute findings, CTA head and neck notable for lower lobe PE's bilaterally  - Neurology is consulting and much appreciated   - Keep NPO pending swallow screen, check MRI brain and routine EEG, continue neuro checks, check UA, ammonia, TSH, CK, b12, folate, and RPR    2. Pulmonary embolism  - CTA head and neck incidentally notable for bilateral pulmonary embolism  - Patient not in any respiratory distress and BP has remained stable  - IV heparin infusion was started in ED  - Check troponin and BNP, continue cardiac monitoring, check echocardiogram, continue IV heparin infusion   3. Acute kidney injury superimposed on CKD III  - SCr is 2.08 on admission, up from 1.49 four days earlier  - Check UA and urine chemistries, continue gentle IVF hydration overnight, hold losartan and torsemide, and repeat chem panel in am   4. Chronic diastolic CHF  - Appears compensated  - Check BNP in light of PE  - Continue gentle IVF hydration, follow daily wt   5. Insulin-dependent DM  - A1c was 6.3% in May 2020  - Continue insulin with dose-reduction in light of increased creatinine and NPO status    6. Hypertension  - BP at goal  - Hold antihypertensives pending swallow screen and MRI brain   7. Dementia   - Plan to resume Namenda and Aricept pending swallow screen and pharmacy med-rec    PPE: Mask, face shield  DVT prophylaxis: IV heparin infusion  Code Status: Full code, confirmed by daughter  Family Communication: Daughter updated in ED  Consults called: Neurology  Admission status: Inpatient. Patient has multiple acute issues including PE, focal neuro deficits, and AKI superimposed on CKD, and will need inpatient management to avoid unnecessary risk, particularly in light of his age and comorbidity.    Vianne Bulls, MD [facility] Pager [phone]  If 7PM-7AM, please contact night-coverage [email or web address] Password [email or web address]  06/01/2019, 9:46 PM

## 2019-06-01 NOTE — Patient Instructions (Signed)
Please call 9-1-1 and send patient to the Emergency room as soon as possible via Ambulance for evaluation of new onset of right arm/leg side weakness.

## 2019-06-01 NOTE — ED Notes (Signed)
[facility] called to activate code stroke per Dr. Sherril Croon

## 2019-06-01 NOTE — Consult Note (Signed)
Requesting Physician: Dr. Rex Kras    Chief Complaint: Altered mental status right-sided weakness  History obtained from:  Chart and nurse, EDP  HPI:                                                                                                                                       [name] is a 85 y.o. male with past medical history significant for dementia presents with altered mental status.  Patient was stroke alerted initially after EDP spoke with family who said that they noticed patient was weaker on the right side that began at 1 PM today.   Family not at bedside and history obtained by EDP.  On assessment patient is antigravity in both upper extremities, and there is no drift noted in bilateral lower extremities.  No facial droop was seen.  Patient speech was dysarthric and he could only answer his name.  Stat CT head was done which showed no acute findings (does show chronic left frontal malacia) and CT angiogram of the head and neck showed no large vessel occlusion and hence code stroke was canceled.  Patient is not a candidate for TPA as he is outside the window.  Patient also identified to have pulmonary embolus on work-up and patient started on IV heparin.    Date last known well: 10.30.20 Time last known well: 1 PM, although unclear tPA Given: No, patient outside TPA window NIHSS:  Baseline MRS   Past Medical History:  Diagnosis Date  . Arthritis   . CAD (coronary artery disease)    s/p DES to LAD 06/2013  . Depression   . Dyslipidemia   . Glaucoma   . Hyperglycemia 04/17/2014  . Hyperlipidemia   . Hypertension   . Stroke ([place])   . Type 2 diabetes mellitus ([place])     Past Surgical History:  Procedure Laterality Date  . CARDIAC CATHETERIZATION  06/2013   DES to LAD by Dr Tamala Julian  . Right Cataract      Family History  Problem Relation Age of Onset  . Arthritis Mother   . Breast cancer Mother   . Arthritis Father   . Dementia Father   . Diabetes Other    . Arthritis Other   . Colon cancer Other   . Stroke Other   . Diabetes type II Sister   . Suicidality Brother   . Diabetes type I Brother   . Diabetes type I Brother   . Diabetes type I Brother   . Diabetes type II Sister   . Breast cancer Daughter   . Emphysema Daughter    Social History:  reports that he quit smoking about 50 years ago. His smoking use included cigarettes. He has never used smokeless tobacco. He reports that he does not drink alcohol or use drugs.  Allergies:  Allergies  Allergen Reactions  . Lisinopril Cough    Medications:  I reviewed home medications   ROS:                                                                                                                                     Unable to obtain due to mental status   Examination:                                                                                                      General: Appears well-developed  Psych: Affect appropriate to situation Eyes: No scleral injection HENT: No OP obstrucion Head: Normocephalic.  Cardiovascular: Normal rate and regular rhythm. Respiratory: Effort normal and breath sounds normal to anterior ascultation GI: Soft.  No distension. There is no tenderness.  Skin: WDI    Neurological Examination Mental status: The patient is awake and can state his name, does not make any sense when answering other questions.  Does not follow commands consistently Cranial Nerves: II: Visual fields : Blinks to threat bilaterally III,IV, VI: ptosis not present, tracks across room in no forced gaze deviation, pupils equal, round, reactive to light and accommodation VII: smile symmetric, difficult to assess accurately due to being edentulous VIII, IX, XI: Difficult to assess due to lack of cooperation XII: midline tongue extension Motor: Right : Upper  extremity   4/5    Left:     Upper extremity   4/5  Lower extremity   5/5     Lower extremity   5/5 Tone and bulk:normal tone throughout; no atrophy noted Sensory: Appears to withdraw bilaterally, is not fully cooperative to exam Deep Tendon Reflexes: 2+ both patella Plantars: Right: downgoing   Left: downgoing Cerebellar: Unable to assess      Lab Results: Basic Metabolic Panel: Recent Labs  Lab 05/28/19 0433 06/01/19 1921 06/01/19 2111  NA 141 141 141  K 4.3 4.8 4.0  CL 104 100 99  CO2 24 29  --   GLUCOSE 149* 112* 97  BUN 18 29* 29*  CREATININE 1.49* 2.08* 2.00*  CALCIUM 9.2 9.6  --     CBC: Recent Labs  Lab 05/28/19 0600 06/01/19 1921 06/01/19 2111  WBC 4.0 8.1  --   NEUTROABS 2.2 5.0  --   HGB 10.4* 11.7* 10.9*  HCT 33.3* 35.5* 32.0*  MCV 97.9 94.2  --   PLT 173 PLATELET CLUMPS NOTED ON SMEAR, UNABLE TO ESTIMATE  --     Coagulation Studies: No results for input(s): LABPROT, INR in the last 72 hours.  Imaging:  Ct Angio Head W Or Wo Contrast  Result Date: 06/01/2019 CLINICAL DATA:  Progressive confusion over the last week. Right-sided weakness for 4 days. EXAM: CT ANGIOGRAPHY HEAD AND NECK TECHNIQUE: Multidetector CT imaging of the head and neck was performed using the standard protocol during bolus administration of intravenous contrast. Multiplanar CT image reconstructions and MIPs were obtained to evaluate the vascular anatomy. Carotid stenosis measurements (when applicable) are obtained utilizing NASCET criteria, using the distal internal carotid diameter as the denominator. CONTRAST:  18mL OMNIPAQUE IOHEXOL 350 MG/ML SOLN COMPARISON:  CT head without contrast 06/01/2019. CT chest 01/18/2019 FINDINGS: CTA NECK FINDINGS Aortic arch: Extensive atherosclerotic calcifications are present in the aortic arch and proximal descending thoracic aorta. There is no aneurysm. Calcifications are greatest at the origin of the left subclavian artery. No significant stenosis  is present in the great vessel origins. Right carotid system: The right common carotid artery is within normal limits. The bifurcation is unremarkable. The cervical right ICA is normal. Left carotid system: The left common carotid artery is within normal limits. Atherosclerotic calcifications are present at the bifurcation without a significant stenosis. The more distal cervical left ICA is otherwise normal. Vertebral arteries: The vertebral arteries are codominant. Both vertebral arteries originate from the subclavian arteries. Atherosclerotic calcifications are present at the origin of the right vertebral artery with a 50% stenosis. There is no significant stenosis of either vertebral artery in the neck otherwise. Skeleton: Extensive degenerative changes are present within the cervical spine. Vertebral body heights are maintained. Calcification posterior longitudinal ligament at C3-4 and C4-5 contributes to moderate central canal stenosis at these levels. Multilevel foraminal disease is due to uncovertebral disease. No focal lytic or blastic lesions are present. The patient is edentulous. Other neck: The salivary glands and ducts. Are within No significant cervical adenopathy normal limits bilaterally is present. No focal mucosal or submucosal lesions are present. Upper chest: Centrilobular emphysematous changes are present. Mild dependent atelectasis is noted. Right inferior pulmonary embolus is noted. There is embolus extending into the left lower lobe pulmonary embolus is well. These are incompletely imaged. Right infrahilar soft tissue mass measures 2.2 x 1.4 cm. Right infrahilar adenopathy is present as well. 5 mm nodule is present posteriorly in the right upper lobe. Other smaller nodules are present in the right upper lobe as well. Review of the MIP images confirms the above findings CTA HEAD FINDINGS Anterior circulation: Atherosclerotic calcifications are present within the cavernous internal carotid  arteries bilaterally without a significant stenosis relative to the more distal vessel. ICA termini are normal. The A1 and M1 segments are normal. The anterior communicating artery is patent. MCA bifurcations are intact. ACA and MCA branch vessels are normal. Posterior circulation: The vertebral arteries are codominant. PICA origins are visualized and normal. Vertebrobasilar junction is normal. Both posterior cerebral arteries originate from basilar tip. The PCA branch vessels are within normal limits. Venous sinuses: The dural sinuses are patent. The straight sinus deep cerebral veins are intact. Cortical veins are unremarkable. Anatomic variants: None Review of the MIP images confirms the above findings IMPRESSION: 1. Bilateral lower lobe pulmonary emboli. 2. 50% stenosis at the origin of the right vertebral artery. 3. Atherosclerotic changes at the carotid bifurcations bilaterally without significant stenosis. 4. No significant proximal stenosis, aneurysm, or branch vessel occlusion within the Circle of Willis. 5. Extensive atherosclerotic changes within the aortic arch and proximal descending thoracic aorta without aneurysm. 6. Multilevel spondylosis of the cervical spine with a calcified posterior longitudinal ligament at  C3-4 and C4-5 resulting in moderate central canal stenosis at these levels. Electronically Signed   By: San Morelle M.D.   On: 06/01/2019 21:00   Ct Head Wo Contrast  Result Date: 06/01/2019 CLINICAL DATA:  Focal neuro deficit for greater than 6 hours, increasing confusion and combativeness for 1 week EXAM: CT HEAD WITHOUT CONTRAST TECHNIQUE: Contiguous axial images were obtained from the base of the skull through the vertex without intravenous contrast. COMPARISON:  CT 04/06/2017 FINDINGS: Brain: Stable regions of gliosis in the right centrum semiovale, left frontal pole and left occipital lobe compatible with remote infarct. No evidence of acute infarction, hemorrhage, or  hydrocephalus. Remote right parietal hygroma/chronic subdural has since resolved from comparison exam. No visible extra-axial collection or mass lesion/mass effect. Symmetric prominence of the ventricles, cisterns and sulci compatible with parenchymal volume loss. Patchy areas of white matter hypoattenuation are most compatible with chronic microvascular angiopathy. Vascular: Atherosclerotic calcification of the carotid siphons and intradural vertebral arteries. No hyperdense vessel. Skull: No calvarial fracture or suspicious osseous lesion. No scalp swelling or hematoma. Sinuses/Orbits: Mural thickening in the anterior ethmoids and right maxillary sinus. No air-fluid levels. Orbital structures are unremarkable aside from prior lens extractions. Other: Edentulous with mandibular prognathism temporomandibular joints remain normally aligned. IMPRESSION: 1. No acute intracranial abnormality. If persisting concern for infarct, MRI is more sensitive and specific. 2. Stable remote infarcts in the right and left frontal and left occipital lobes. 3. Remote right parietal hygroma/chronic subdural has since resolved from comparison exam. 4. Chronic microvascular ischemic changes and parenchymal volume loss. Electronically Signed   By: Lovena Le M.D.   On: 06/01/2019 19:55   Ct Angio Neck W And/or Wo Contrast  Result Date: 06/01/2019 CLINICAL DATA:  Progressive confusion over the last week. Right-sided weakness for 4 days. EXAM: CT ANGIOGRAPHY HEAD AND NECK TECHNIQUE: Multidetector CT imaging of the head and neck was performed using the standard protocol during bolus administration of intravenous contrast. Multiplanar CT image reconstructions and MIPs were obtained to evaluate the vascular anatomy. Carotid stenosis measurements (when applicable) are obtained utilizing NASCET criteria, using the distal internal carotid diameter as the denominator. CONTRAST:  53mL OMNIPAQUE IOHEXOL 350 MG/ML SOLN COMPARISON:  CT head  without contrast 06/01/2019. CT chest 01/18/2019 FINDINGS: CTA NECK FINDINGS Aortic arch: Extensive atherosclerotic calcifications are present in the aortic arch and proximal descending thoracic aorta. There is no aneurysm. Calcifications are greatest at the origin of the left subclavian artery. No significant stenosis is present in the great vessel origins. Right carotid system: The right common carotid artery is within normal limits. The bifurcation is unremarkable. The cervical right ICA is normal. Left carotid system: The left common carotid artery is within normal limits. Atherosclerotic calcifications are present at the bifurcation without a significant stenosis. The more distal cervical left ICA is otherwise normal. Vertebral arteries: The vertebral arteries are codominant. Both vertebral arteries originate from the subclavian arteries. Atherosclerotic calcifications are present at the origin of the right vertebral artery with a 50% stenosis. There is no significant stenosis of either vertebral artery in the neck otherwise. Skeleton: Extensive degenerative changes are present within the cervical spine. Vertebral body heights are maintained. Calcification posterior longitudinal ligament at C3-4 and C4-5 contributes to moderate central canal stenosis at these levels. Multilevel foraminal disease is due to uncovertebral disease. No focal lytic or blastic lesions are present. The patient is edentulous. Other neck: The salivary glands and ducts. Are within No significant cervical adenopathy normal limits bilaterally is  present. No focal mucosal or submucosal lesions are present. Upper chest: Centrilobular emphysematous changes are present. Mild dependent atelectasis is noted. Right inferior pulmonary embolus is noted. There is embolus extending into the left lower lobe pulmonary embolus is well. These are incompletely imaged. Right infrahilar soft tissue mass measures 2.2 x 1.4 cm. Right infrahilar adenopathy is  present as well. 5 mm nodule is present posteriorly in the right upper lobe. Other smaller nodules are present in the right upper lobe as well. Review of the MIP images confirms the above findings CTA HEAD FINDINGS Anterior circulation: Atherosclerotic calcifications are present within the cavernous internal carotid arteries bilaterally without a significant stenosis relative to the more distal vessel. ICA termini are normal. The A1 and M1 segments are normal. The anterior communicating artery is patent. MCA bifurcations are intact. ACA and MCA branch vessels are normal. Posterior circulation: The vertebral arteries are codominant. PICA origins are visualized and normal. Vertebrobasilar junction is normal. Both posterior cerebral arteries originate from basilar tip. The PCA branch vessels are within normal limits. Venous sinuses: The dural sinuses are patent. The straight sinus deep cerebral veins are intact. Cortical veins are unremarkable. Anatomic variants: None Review of the MIP images confirms the above findings IMPRESSION: 1. Bilateral lower lobe pulmonary emboli. 2. 50% stenosis at the origin of the right vertebral artery. 3. Atherosclerotic changes at the carotid bifurcations bilaterally without significant stenosis. 4. No significant proximal stenosis, aneurysm, or branch vessel occlusion within the Circle of Willis. 5. Extensive atherosclerotic changes within the aortic arch and proximal descending thoracic aorta without aneurysm. 6. Multilevel spondylosis of the cervical spine with a calcified posterior longitudinal ligament at C3-4 and C4-5 resulting in moderate central canal stenosis at these levels. Electronically Signed   By: San Morelle M.D.   On: 06/01/2019 21:00     ASSESSMENT AND PLAN 85 y.o. male with past medical history significant for dementia presents with altered mental status.  Patient was stroke alerted initially after EDP spoke with family who said that they noticed patient  was weaker on the right side that began at 1 PM today.  On my assessment, patient may have mild right upper extremity weakness compared to the left however this was difficult to assess.  No obvious facial droop was seen.  Patient has known dementia and poor verbal output. Unclear as to what his baseline ability to answer questions.  Stat CT head obtained showed left frontal encephalomalacia.  CTA was done to rule out LVO, showed incidental pulmonary embolism and patient started on heparin drip.  Metabolic work-up so far negative with ammonia being normal.  CK elevated at 3868.  Given acute onset of right-sided weakness and left frontal encephalomalacia, seizure a possibility with postictal state.  MRI brain to rule out stroke also pending.   Acute metabolic encephalopathy Left frontal encephalomalacia  Pulmonary embolism  Recommendations MRI brain without contrast to evaluate for acute stroke Routine EEG in the morning Seizure precautions Frequent neurochecks Continue Aricept and Namenda for dementia  Mackenna Kamer [facility] Pager Number [phone]

## 2019-06-01 NOTE — ED Triage Notes (Signed)
Brought by ems from home for AMS.  Per family was here a week ago for increased confusion and being combative.  Was sent home with haldol 5mg .  Per caregiver he took 1 1/2 doses this week. EMS was told patient has had R sided weakness since Monday.  Noted to be confused.  Answers when name is called loudly.  Then goes to sleep quickly.

## 2019-06-01 NOTE — ED Notes (Signed)
Checked CBG 89

## 2019-06-01 NOTE — Progress Notes (Signed)
This service is provided via telemedicine  No vital signs collected/recorded due to the encounter was a telemedicine visit.   Location of patient (ex: home, work):  Home   Patient consents to a telephone visit:  Yes   Location of the provider (ex: office, home):  Office   Name of any referring provider:  Loma Boston  Names of all persons participating in the telemedicine service and their role in the encounter:  Marlowe Sax, NP, Ruthell Rummage CMA, patient and daughter [name]  Time spent on call:  Ruthell Rummage Cma spent 7 minutes on phone with patient.    Provider: Anjalina Bergevin FNP-C  Gayland Curry, DO  Patient Care Team: Gayland Curry, DO as PCP - General (Geriatric Medicine) Macarthur Critchley, [place] as Referring Physician (Optometry) Delice Lesch Lezlie Octave, MD as Consulting Physician (Neurology)  Extended Emergency Contact Information Primary Emergency Contact: [facility] Address: [address]          [place], [place] [address] [name] of [place] Phone: [phone] Relation: Daughter Secondary Emergency Contact: [name] [place] of [place] Phone: [phone] Mobile Phone: [phone] Relation: Other  Code Status:  Goals of care: Advanced Directive information Advanced Directives 05/28/2019  Does Patient Have a Medical Advance Directive? Yes  Type of Advance Directive [place]  Does patient want to make changes to medical advance directive? No - Guardian declined  Copy of [place] in Chart? Yes - validated most recent copy scanned in chart (See row information)  Would patient like information on creating a medical advance directive? -     Chief Complaint  Patient presents with  . Acute Visit    Halotdol daughter states she is concerned haldol is too strong and patient is having hard time function. Daughter states was given medication at hospital Monday. Patient is unable to lift his right leg and  arm up. Daughter states patient is constantly falling and is unable to stand.     HPI:  Pt is a 85 y.o. male seen today for an acute visit for evaluation of medication.Patient's daughter states she is concerned that haldol is too strong patient.she states patient was prescribed haldol at the ED on 05/28/2019 due to his aggressive behavior.she states the dad is having hard time functioning since he came from the hospital.He sleeps most of the time and unable to walk.she states patient is unable to stand,lift his right leg and right arm up and constantly falling.she has stopped giving him haldol but still unable to lift his right arm and right leg.she has had to get her brother to assist with transfer of patient.she states prior to hospital visit patient was able to walk with his walker. He has had no fever or chills.Also states has no problems with eating,swallowing or speech.  Daughter is very upset states no body is helping her dad and concerned about use of haldol.I've discuss with patient's daughter to send patient to the ED as soon as possible for evaluation of right side weakness to rule out stroke.she states unable to transfer patient.I've recommended her to call 9-1-1 for patient to be transferred via EMS but states she is tired of paying the ambulance.Patient's daughter understands the urgency of evaluation at the hospital for right side weakness but states PCP's office should be able to do something about the patient's condition.states will looking for another doctor for the patient.she got agitated yelling that we're not doing anything.Said bye to provider and hanged  up the phone.      Past Medical History:  Diagnosis Date  . Arthritis   . CAD (coronary artery disease)    s/p DES to LAD 06/2013  . Depression   . Dyslipidemia   . Glaucoma   . Hyperglycemia 04/17/2014  . Hyperlipidemia   . Hypertension   . Stroke ([place])   . Type 2 diabetes mellitus ([place])    Past Surgical History:   Procedure Laterality Date  . CARDIAC CATHETERIZATION  06/2013   DES to LAD by Dr Tamala Julian  . Right Cataract      Allergies  Allergen Reactions  . Lisinopril Cough    Outpatient Encounter Medications as of 06/01/2019  Medication Sig  . albuterol (PROVENTIL HFA;VENTOLIN HFA) 108 (90 Base) MCG/ACT inhaler INHALE TWO PUFFS EVERY 4-6 HOURS AS NEEDED FOR DIFFICULTY BREATHING,WHEEZING OR COUGH  . Alcohol Swabs (B-D SINGLE USE SWABS REGULAR) PADS USE AS DIRECTED WITH CHECKING BLOOD SUGAR THREE TIMES DAILY  . amLODipine (NORVASC) 5 MG tablet Take 1 tablet (5 mg total) by mouth daily.  [name] aspirin EC 81 MG tablet Take 1 tablet (81 mg total) by mouth daily.  . Blood Glucose Calibration (ACCU-CHEK AVIVA) SOLN Use as Directed. Dx: E11.51  . escitalopram (LEXAPRO) 5 MG tablet TAKE 1 TABLET EVERY DAY  . glucose blood (ACCU-CHEK AVIVA) test strip Use to test blood sugar three times daily. Dx: E11.51  . haloperidol (HALDOL) 5 MG tablet Take 1 tablet (5 mg total) by mouth every 6 (six) hours as needed for agitation. Give at dinnertime and at bedtime  . hydrochlorothiazide (HYDRODIURIL) 25 MG tablet TAKE 1 TABLET (25 MG TOTAL) BY MOUTH DAILY.  [name] Insulin Glargine, 1 Unit Dial, (TOUJEO SOLOSTAR) 300 UNIT/ML SOPN Inject 65 Units into the skin daily.  . Insulin Pen Needle (B-D UF III MINI PEN NEEDLES) 31G X 5 MM MISC USE AS DIRECTED.  Elmore Guise Devices (ACCU-CHEK SOFTCLIX) lancets Use as instructed with checking blood sugar three times daily. Dx: E11.51  . Loratadine 10 MG CAPS Take 1 capsule (10 mg total) by mouth daily.  [name] losartan (COZAAR) 50 MG tablet TAKE 1 TABLET (50 MG TOTAL) BY MOUTH DAILY.  . mineral oil-hydrophilic petrolatum (AQUAPHOR) ointment Apply topically 2 (two) times daily. To legs  . Multiple Vitamins-Minerals (SENTRY SENIOR) TABS Take 1 tablet by mouth daily.  [name] NAMZARIC 28-10 MG CP24 TAKE 1 CAPSULE EVERY DAY  . simvastatin (ZOCOR) 20 MG tablet TAKE 1 TABLET (20 MG TOTAL) BY MOUTH DAILY.  .  tamsulosin (FLOMAX) 0.4 MG CAPS capsule Take 0.4 mg by mouth daily after supper.  . torsemide (DEMADEX) 20 MG tablet Take 1 tablet (20 mg total) by mouth daily as needed.  . triamcinolone cream (KENALOG) 0.1 % APPLY 1 APPLICATION OF CREAM TWICE DAILY FOR 14 DAYS TO LOWER EXTRMITIES   No facility-administered encounter medications on file as of 06/01/2019.     Review of Systems  Constitutional: Negative for appetite change, chills, fatigue and fever.  HENT: Negative for congestion, rhinorrhea, sinus pressure, sinus pain, sneezing and sore throat.   Respiratory: Negative for cough, chest tightness, shortness of breath and wheezing.   Cardiovascular: Negative for chest pain.  Musculoskeletal: Positive for gait problem. Negative for joint swelling.  Neurological: Positive for weakness. Negative for dizziness, speech difficulty, light-headedness and headaches.       Right arm and leg weakness per HPI   Psychiatric/Behavioral: Positive for confusion and sleep disturbance. Negative for agitation.    Immunization History  Administered Date(s) Administered  . Fluad Quad(high Dose 65+) 04/12/2019  . Influenza, High Dose Seasonal PF 06/22/2013, 05/30/2015  . Influenza,inj,Quad PF,6+ Mos 04/19/2014, 04/02/2016, 04/01/2017, 04/26/2018  . Pneumococcal Conjugate-13 07/25/2014  . Pneumococcal Polysaccharide-23 08/03/2011, 06/22/2013  . Td 10/22/2013  . Tdap 07/30/2015  . Zoster Recombinat (Shingrix) 05/20/2017, 07/20/2017   Pertinent  Health Maintenance Due  Topic Date Due  . OPHTHALMOLOGY EXAM  06/14/2018  . FOOT EXAM  10/05/2018  . HEMOGLOBIN A1C  06/03/2019  . INFLUENZA VACCINE  Completed  . PNA vac Low Risk Adult  Completed   Fall Risk  06/01/2019 04/11/2019 01/22/2019 01/02/2019 12/29/2018  Falls in the past year? 1 0 1 0 1  Number falls in past yr: 1 0 0 0 0  Injury with Fall? 1 0 0 0 0  Comment - - - - -  Risk Factor Category  - - - - -   There were no vitals filed for this visit. There  is no height or weight on file to calculate BMI. Physical Exam Unable to complete on telephone visit.  Labs reviewed: Recent Labs    08/31/18 1034 12/01/18 1032 05/28/19 0433  NA 145 141 141  K 3.9 4.3 4.3  CL 103 100 104  CO2 35* 35* 24  GLUCOSE 82 42* 149*  BUN 16 19 18   CREATININE 1.40* 1.63* 1.49*  CALCIUM 9.8 9.9 9.2   Recent Labs    08/31/18 1034 12/01/18 1032 05/28/19 0433  AST 31 36* 41  ALT 22 32 29  ALKPHOS  --   --  61  BILITOT 0.5 0.5 0.5  PROT 6.7 7.1 6.5  ALBUMIN  --   --  3.8   Recent Labs    08/31/18 1034 12/01/18 1032 05/28/19 0600  WBC 4.7 6.5 4.0  NEUTROABS 2,247 4,024 2.2  HGB 11.8* 11.6* 10.4*  HCT 35.2* 34.1* 33.3*  MCV 92.9 93.2 97.9  PLT 180 200 173   Lab Results  Component Value Date   TSH 1.46 08/31/2018   Lab Results  Component Value Date   HGBA1C 6.3 (H) 12/01/2018   Lab Results  Component Value Date   CHOL 147 12/01/2018   HDL 37 (L) 12/01/2018   LDLCALC 89 12/01/2018   LDLDIRECT 92.0 11/24/2015   TRIG 117 12/01/2018   CHOLHDL 4.0 12/01/2018    Significant Diagnostic Results in last 30 days:  No results found.  Assessment/Plan 1. Acute right-sided weakness New onset since discharged from ED.Unable to lift right arm and right leg.Recommended for patient to be send to the ED ASAP but patient's daughter stated was tired of paying for the ambulance.she got agitated and hanged up the phone.    2. Unsteady gait Unable to stand per daughter falling and has required transfer with assistance.Daughter states has had to call the brother to assist.Recommeded evaluation in the ED to rule out stroke due to new onset of right side weakness as above.  3. Moderate dementia with behavioral disturbance ([place]) Status post ED 05/28/2019 for aggressive behavior.haldol was prescribed but daughter states medication is too strong for patient.Has stopped giving haldol.recommended ED for evaluation as above.   Family/ staff Communication:  Reviewed plan of care with patient and daughter.   Labs/tests ordered: Recommended evaluation in the ED ASAP for right sided weakness.   Spent 11 minutes of non-face to face with patient    Sandrea Hughs, NP

## 2019-06-02 ENCOUNTER — Inpatient Hospital Stay (HOSPITAL_COMMUNITY): Payer: Medicare HMO

## 2019-06-02 DIAGNOSIS — R29818 Other symptoms and signs involving the nervous system: Secondary | ICD-10-CM

## 2019-06-02 DIAGNOSIS — I351 Nonrheumatic aortic (valve) insufficiency: Secondary | ICD-10-CM | POA: Diagnosis not present

## 2019-06-02 DIAGNOSIS — I2699 Other pulmonary embolism without acute cor pulmonale: Secondary | ICD-10-CM | POA: Diagnosis not present

## 2019-06-02 DIAGNOSIS — Z8673 Personal history of transient ischemic attack (TIA), and cerebral infarction without residual deficits: Secondary | ICD-10-CM | POA: Diagnosis not present

## 2019-06-02 LAB — RPR: RPR Ser Ql: NONREACTIVE

## 2019-06-02 LAB — CBC
HCT: 36.4 % — ABNORMAL LOW (ref 39.0–52.0)
Hemoglobin: 11.6 g/dL — ABNORMAL LOW (ref 13.0–17.0)
MCH: 30.3 pg (ref 26.0–34.0)
MCHC: 31.9 g/dL (ref 30.0–36.0)
MCV: 95 fL (ref 80.0–100.0)
Platelets: 174 10*3/uL (ref 150–400)
RBC: 3.83 MIL/uL — ABNORMAL LOW (ref 4.22–5.81)
RDW: 13.8 % (ref 11.5–15.5)
WBC: 8.4 10*3/uL (ref 4.0–10.5)
nRBC: 0 % (ref 0.0–0.2)

## 2019-06-02 LAB — LIPID PANEL
Cholesterol: 134 mg/dL (ref 0–200)
HDL: 48 mg/dL (ref >40)
LDL Cholesterol: 74 mg/dL (ref 0–99)
Total CHOL/HDL Ratio: 2.8 RATIO
Triglycerides: 61 mg/dL (ref <150)
VLDL: 12 mg/dL (ref 0–40)

## 2019-06-02 LAB — GLUCOSE, CAPILLARY
Glucose-Capillary: 87 mg/dL (ref 70–99)
Glucose-Capillary: 88 mg/dL (ref 70–99)
Glucose-Capillary: 83 mg/dL (ref 70–99)
Glucose-Capillary: 100 mg/dL — ABNORMAL HIGH (ref 70–99)
Glucose-Capillary: 96 mg/dL (ref 70–99)
Glucose-Capillary: 99 mg/dL (ref 70–99)
Glucose-Capillary: 76 mg/dL (ref 70–99)
Glucose-Capillary: 75 mg/dL (ref 70–99)
Glucose-Capillary: 175 mg/dL — ABNORMAL HIGH (ref 70–99)
Glucose-Capillary: 173 mg/dL — ABNORMAL HIGH (ref 70–99)

## 2019-06-02 LAB — BASIC METABOLIC PANEL
Anion gap: 15 (ref 5–15)
BUN: 29 mg/dL — ABNORMAL HIGH (ref 8–23)
CO2: 25 mmol/L (ref 22–32)
Calcium: 9.4 mg/dL (ref 8.9–10.3)
Chloride: 102 mmol/L (ref 98–111)
Creatinine, Ser: 1.79 mg/dL — ABNORMAL HIGH (ref 0.61–1.24)
GFR calc Af Amer: 37 mL/min — ABNORMAL LOW (ref >60)
GFR calc non Af Amer: 32 mL/min — ABNORMAL LOW (ref >60)
Glucose, Bld: 94 mg/dL (ref 70–99)
Potassium: 4.2 mmol/L (ref 3.5–5.1)
Sodium: 142 mmol/L (ref 135–145)

## 2019-06-02 LAB — TROPONIN I (HIGH SENSITIVITY)
Troponin I (High Sensitivity): 51 ng/L — ABNORMAL HIGH (ref <18)
Troponin I (High Sensitivity): 59 ng/L — ABNORMAL HIGH (ref <18)

## 2019-06-02 LAB — ECHOCARDIOGRAM COMPLETE
Height: 71 in
Weight: 2790.14 oz

## 2019-06-02 LAB — HEPARIN LEVEL (UNFRACTIONATED)
Heparin Unfractionated: 0.81 IU/mL — ABNORMAL HIGH (ref 0.30–0.70)
Heparin Unfractionated: 0.44 IU/mL (ref 0.30–0.70)

## 2019-06-02 LAB — VITAMIN B12: Vitamin B-12: 684 pg/mL (ref 180–914)

## 2019-06-02 LAB — HEMOGLOBIN A1C
Hgb A1c MFr Bld: 6.3 % — ABNORMAL HIGH (ref 4.8–5.6)
Mean Plasma Glucose: 134.11 mg/dL

## 2019-06-02 LAB — SARS CORONAVIRUS 2 (TAT 6-24 HRS): SARS Coronavirus 2: NEGATIVE

## 2019-06-02 LAB — HIV ANTIBODY (ROUTINE TESTING W REFLEX): HIV Screen 4th Generation wRfx: NONREACTIVE

## 2019-06-02 MED ORDER — DEXTROSE 5 % IV SOLN
INTRAVENOUS | Status: AC
  Administered 2019-06-02: 01:00:00 via INTRAVENOUS

## 2019-06-02 NOTE — Progress Notes (Signed)
Subjective: He continues to have right sided weakness  Exam: Vitals:   06/02/19 0508 06/02/19 1231  BP: 137/65 125/77  Pulse: 76 99  Resp: 18 20  Temp: 97.7 F (36.5 C)   SpO2: 100% 100%   Gen: In bed, NAD Resp: non-labored breathing, no acute distress Abd: soft, nt  Neuro: MS: awake, gives short answers which are hard to understand, speech is dysarthric(but at baseline per daughter) CN:VFF, EOMI Motor: 4-/5 weakness of the right arm and leg.  Sensory:decreased on the right.   MRI brain - no acute infarct.   Impression: 85 yo M With new onset right sided weakness in the setting of new PE and previous stroke.  His MRI is negative, suggesting that this is recrudescence of his previous symptoms, but I also think is possible he has been having a stuttering infarct.  If his weakness continues tomorrow, I may recommend repeating MRI, and this time including the cervical spine as well.  Recommendations: 1) PT, OT, ST 2) anticoagulation for PE 3) neurology will continue to follow  Roland Rack, MD [facility] [phone]  If 7pm- 7am, please page neurology on call as listed in [place].

## 2019-06-02 NOTE — Plan of Care (Signed)
Patient progressing towards plan of care goals. 

## 2019-06-02 NOTE — Plan of Care (Signed)
  Problem: Education: Goal: Knowledge of General Education information will improve Description Including pain rating scale, medication(s)/side effects and non-pharmacologic comfort measures Outcome: Progressing   Problem: Health Behavior/Discharge Planning: Goal: Ability to manage health-related needs will improve Outcome: Progressing   Problem: Clinical Measurements: Goal: Ability to maintain clinical measurements within normal limits will improve Outcome: Progressing Goal: Will remain free from infection Outcome: Progressing Goal: Diagnostic test results will improve Outcome: Progressing Goal: Respiratory complications will improve Outcome: Progressing Goal: Cardiovascular complication will be avoided Outcome: Progressing   Problem: Activity: Goal: Risk for activity intolerance will decrease Outcome: Progressing   Problem: Nutrition: Goal: Adequate nutrition will be maintained Outcome: Progressing   Problem: Coping: Goal: Level of anxiety will decrease Outcome: Progressing   Problem: Elimination: Goal: Will not experience complications related to bowel motility Outcome: Progressing Goal: Will not experience complications related to urinary retention Outcome: Progressing   Problem: Pain Managment: Goal: General experience of comfort will improve Outcome: Progressing   Problem: Safety: Goal: Ability to remain free from injury will improve Outcome: Progressing   Problem: Skin Integrity: Goal: Risk for impaired skin integrity will decrease Outcome: Progressing   Problem: Education: Goal: Knowledge of disease or condition will improve Outcome: Progressing Goal: Knowledge of secondary prevention will improve Outcome: Progressing Goal: Knowledge of patient specific risk factors addressed and post discharge goals established will improve Outcome: Progressing   Harim Bi, BSN, RN 

## 2019-06-02 NOTE — Progress Notes (Signed)
PROGRESS NOTE  Yi Guilmette [id number] DOB: Jan 19, 1922 DOA: 06/01/2019 PCP: Gayland Curry, DO  HPI/Recap of past 24 hours: [name] is a 85 y.o. male with medical history significant for coronary artery disease, history of CVA, insulin-dependent diabetes mellitus, hypertension, chronic kidney disease stage III and depression with behavioral disturbance, now presenting to the emergency department for evaluation of somnolence and weakness.  Patient was started on Haldol a few days ago and has reportedly been very somnolent since then with multiple falls.  Patient's daughter was concerned that his right arm and leg were weaker than the left at approximately 1 PM today.  Daughter was able to arrange a telemedicine visit with the patient's PCP who recommended further evaluation in the emergency department.  Patient is unable to contribute to the history due to his clinical condition.  ED Course: Upon arrival to the ED, patient is found to be afebrile, saturating mid 90s on room air, and slightly tachycardic.  EKG features sinus tachycardia with rate 104, RBBB, and nonspecific ST-T abnormalities.  Noncontrast head CT is negative for acute findings.  CTA head and neck is concerning for bilateral lower lobe PE.  Chemistry panel features a creatinine of 2.08, up from 1.49 four days earlier.  CBC features a mild normocytic anemia and clumped platelets.  Neurology was consulted by the ED physician and the patient was started on IV heparin infusion.   06/02/19: Patient was seen and examined at his bedside this am. Hypoglycemic ovenight. Patient is alert but confused in a state of dementia. Not in distress. MRI brain no acute findings. Non productive cough on exam. PE noted incidentally during CTA head and neck study.  Assessment/Plan: Principal Problem:   Pulmonary embolism (HCC) Active Problems:   Type 2 diabetes, controlled, with peripheral circulatory disorder (HCC)   Essential hypertension    Coronary artery disease involving native coronary artery of native heart with angina pectoris (HCC)   Moderate dementia with behavioral disturbance (HCC)   History of stroke   Centrilobular emphysema (HCC)   Acute renal failure superimposed on stage 3 chronic kidney disease (HCC)   Chronic diastolic CHF (congestive heart failure) ([place])   1. Acute encephalopathy; weakness  - Presents with several days of somnolence since he was started on Haldol and then right arm and leg weakness noted by his daughter several hours prior to arrival  - Head CT with no acute findings, CTA head and neck notable for lower lobe PE's bilaterally   MRI brain no acute findings - Neurology is consulting and much appreciated   - Keep NPO pending swallow screen, check routine EEG, completed, results are pending, continue neuro checks UA, Ammonia, B12, TSH unrevealing CK >3000  folate and RPR pending   2. Pulmonary embolism, incidentally found on CTA head and neck - CTA head and neck incidentally notable for bilateral pulmonary embolism  Non productive cough on exam Currently on hep drip until we obtain results of TTE and no planned procedures. O2 sat 100% on 1L   3. Acute kidney injury superimposed on CKD III  Baseline cr appears to be  1.4 with GFR 49 - presented with SCr is 2.08 on admission, up from 1.49 four days earlier  - Check UA and urine chemistries, continue gentle IVF hydration overnight, hold losartan and torsemide, and repeat chem panel in am  Cr improving this am cr 1.79 with GFR 37 C/. To avoid nephrotoxins  Continue to monitor UO Daily BMPs.  4. Chronic diastolic CHF  -  Appears compensated  BNP wjthin normal range Start strict I&O and daily weight  5. Insulin-dependent DM  - A1c was 6.3% in May 2020  - Continue insulin with dose-reduction in light of increased creatinine and NPO status    6. Hypertension  - BP at goal  - Hold antihypertensives pending swallow screen and MRI brain    7. Dementia with behavioral disturbances  - Plan to resume Namenda and Aricept pending swallow screen and pharmacy med-rec   Elevated CPK Presented with CPK >3800 Repeat CPK in the am Continue IV fluid hydration Monitor UO and renal function  Elevated troponin likely demand ischemia in the context of newly diagnosed PE and AKI. Troponin peaked at 59. Denies anginal symptoms at the time of this visit.     PPE: Mask, face shield  DVT prophylaxis: IV heparin infusion  Code Status: Full code, confirmed by daughter  Family Communication: Daughter updated in ED  Consults called: Neurology   Disposition: Ongoing workup. Possible DC once symptomatology has improved and pt is hemodynamically stable.   Objective: Vitals:   06/02/19 0317 06/02/19 0322 06/02/19 0324 06/02/19 0508  BP:  (!) 143/66  137/65  Pulse:  74  76  Resp:  16  18  Temp:  97.6 F (36.4 C)  97.7 F (36.5 C)  TempSrc:  Axillary  Oral  SpO2:  99%  100%  Weight: 79.1 kg  79.1 kg   Height: 5\' 11"  (1.803 m)       Intake/Output Summary (Last 24 hours) at 06/02/2019 1108 Last data filed at 06/02/2019 1000 Gross per 24 hour  Intake 583.16 ml  Output --  Net 583.16 ml   Filed Weights   06/02/19 0317 06/02/19 0324  Weight: 79.1 kg 79.1 kg    Exam:   General: 85 y.o. year-old male well developed well nourished in no acute distress.  Alert and interactive in the setting of dementia.  Cardiovascular: Regular rate and rhythm with no rubs or gallops.  No thyromegaly or JVD noted.    Respiratory: Clear to auscultation with no wheezes or rales. Good inspiratory effort.  Abdomen: Soft nontender nondistended with normal bowel sounds x4 quadrants.  Musculoskeletal: No lower extremity edema. 2/4 pulses in all 4 extremities.  Psychiatry: Mood is appropriate for condition and setting   Data Reviewed: CBC: Recent Labs  Lab 05/28/19 0600 06/01/19 1921 06/01/19 2111 06/02/19 0250  WBC 4.0 8.1  --  8.4   NEUTROABS 2.2 5.0  --   --   HGB 10.4* 11.7* 10.9* 11.6*  HCT 33.3* 35.5* 32.0* 36.4*  MCV 97.9 94.2  --  95.0  PLT 173 PLATELET CLUMPS NOTED ON SMEAR, UNABLE TO ESTIMATE  --  [id number]   Basic Metabolic Panel: Recent Labs  Lab 05/28/19 0433 06/01/19 1921 06/01/19 2111 06/02/19 0250  NA 141 141 141 142  K 4.3 4.8 4.0 4.2  CL 104 100 99 102  CO2 24 29  --  25  GLUCOSE 149* 112* 97 94  BUN 18 29* 29* 29*  CREATININE 1.49* 2.08* 2.00* 1.79*  CALCIUM 9.2 9.6  --  9.4   GFR: Estimated Creatinine Clearance: 26.3 mL/min (A) (by C-G formula based on SCr of 1.79 mg/dL (H)). Liver Function Tests: Recent Labs  Lab 05/28/19 0433 06/01/19 1921  AST 41 166*  ALT 29 67*  ALKPHOS 61 50  BILITOT 0.5 1.7*  PROT 6.5 6.6  ALBUMIN 3.8 3.6   No results for input(s): LIPASE, AMYLASE in the last 168  hours. Recent Labs  Lab 06/01/19 2202  AMMONIA 29   Coagulation Profile: No results for input(s): INR, PROTIME in the last 168 hours. Cardiac Enzymes: Recent Labs  Lab 06/01/19 2202  CKTOTAL 3,868*   BNP (last 3 results) No results for input(s): PROBNP in the last 8760 hours. HbA1C: Recent Labs    06/02/19 0250  HGBA1C 6.3*   CBG: Recent Labs  Lab 06/02/19 0302 06/02/19 0405 06/02/19 0506 06/02/19 0600 06/02/19 0656  GLUCAP 88 83 100* 96 99   Lipid Profile: Recent Labs    06/02/19 0250  CHOL 134  HDL 48  LDLCALC 74  TRIG 61  CHOLHDL 2.8   Thyroid Function Tests: Recent Labs    06/01/19 2202  TSH 1.527   Anemia Panel: Recent Labs    06/01/19 2202  VITAMINB12 684   Urine analysis:    Component Value Date/Time   COLORURINE YELLOW 05/28/2019 0640   APPEARANCEUR CLEAR 05/28/2019 0640   LABSPEC 1.012 05/28/2019 0640   PHURINE 5.0 05/28/2019 0640   GLUCOSEU NEGATIVE 05/28/2019 0640   HGBUR NEGATIVE 05/28/2019 0640   BILIRUBINUR NEGATIVE 05/28/2019 0640   KETONESUR NEGATIVE 05/28/2019 0640   PROTEINUR NEGATIVE 05/28/2019 0640   UROBILINOGEN 0.2 04/17/2014  1756   NITRITE NEGATIVE 05/28/2019 0640   LEUKOCYTESUR NEGATIVE 05/28/2019 0640   Sepsis Labs: @LABRCNTIP (procalcitonin:4,lacticidven:4)  ) Recent Results (from the past 240 hour(s))  SARS CORONAVIRUS 2 (TAT 6-24 HRS) Nasopharyngeal Nasopharyngeal Swab     Status: None   Collection Time: 06/01/19  9:56 PM   Specimen: Nasopharyngeal Swab  Result Value Ref Range Status   SARS Coronavirus 2 NEGATIVE NEGATIVE Final    Comment: (NOTE) SARS-CoV-2 target nucleic acids are NOT DETECTED. The SARS-CoV-2 RNA is generally detectable in upper and lower respiratory specimens during the acute phase of infection. Negative results do not preclude SARS-CoV-2 infection, do not rule out co-infections with other pathogens, and should not be used as the sole basis for treatment or other patient management decisions. Negative results must be combined with clinical observations, patient history, and epidemiological information. The expected result is Negative. Fact Sheet for Patients: SugarRoll.be Fact Sheet for Healthcare Providers: https://www.woods-mathews.com/ This test is not yet approved or cleared by the [place] FDA and  has been authorized for detection and/or diagnosis of SARS-CoV-2 by FDA under an Emergency Use Authorization (EUA). This EUA will remain  in effect (meaning this test can be used) for the duration of the COVID-19 declaration under Section 56 4(b)(1) of the Act, 21 U.S.C. section 360bbb-3(b)(1), unless the authorization is terminated or revoked sooner. Performed at [facility], [place] [address], [place], [place] [address]       Studies: Ct Angio Head W Or Wo Contrast  Result Date: 06/01/2019 CLINICAL DATA:  Progressive confusion over the last week. Right-sided weakness for 4 days. EXAM: CT ANGIOGRAPHY HEAD AND NECK TECHNIQUE: Multidetector CT imaging of the head and neck was performed using the standard protocol during  bolus administration of intravenous contrast. Multiplanar CT image reconstructions and MIPs were obtained to evaluate the vascular anatomy. Carotid stenosis measurements (when applicable) are obtained utilizing NASCET criteria, using the distal internal carotid diameter as the denominator. CONTRAST:  57mL OMNIPAQUE IOHEXOL 350 MG/ML SOLN COMPARISON:  CT head without contrast 06/01/2019. CT chest 01/18/2019 FINDINGS: CTA NECK FINDINGS Aortic arch: Extensive atherosclerotic calcifications are present in the aortic arch and proximal descending thoracic aorta. There is no aneurysm. Calcifications are greatest at the origin of the left subclavian  artery. No significant stenosis is present in the great vessel origins. Right carotid system: The right common carotid artery is within normal limits. The bifurcation is unremarkable. The cervical right ICA is normal. Left carotid system: The left common carotid artery is within normal limits. Atherosclerotic calcifications are present at the bifurcation without a significant stenosis. The more distal cervical left ICA is otherwise normal. Vertebral arteries: The vertebral arteries are codominant. Both vertebral arteries originate from the subclavian arteries. Atherosclerotic calcifications are present at the origin of the right vertebral artery with a 50% stenosis. There is no significant stenosis of either vertebral artery in the neck otherwise. Skeleton: Extensive degenerative changes are present within the cervical spine. Vertebral body heights are maintained. Calcification posterior longitudinal ligament at C3-4 and C4-5 contributes to moderate central canal stenosis at these levels. Multilevel foraminal disease is due to uncovertebral disease. No focal lytic or blastic lesions are present. The patient is edentulous. Other neck: The salivary glands and ducts. Are within No significant cervical adenopathy normal limits bilaterally is present. No focal mucosal or submucosal  lesions are present. Upper chest: Centrilobular emphysematous changes are present. Mild dependent atelectasis is noted. Right inferior pulmonary embolus is noted. There is embolus extending into the left lower lobe pulmonary embolus is well. These are incompletely imaged. Right infrahilar soft tissue mass measures 2.2 x 1.4 cm. Right infrahilar adenopathy is present as well. 5 mm nodule is present posteriorly in the right upper lobe. Other smaller nodules are present in the right upper lobe as well. Review of the MIP images confirms the above findings CTA HEAD FINDINGS Anterior circulation: Atherosclerotic calcifications are present within the cavernous internal carotid arteries bilaterally without a significant stenosis relative to the more distal vessel. ICA termini are normal. The A1 and M1 segments are normal. The anterior communicating artery is patent. MCA bifurcations are intact. ACA and MCA branch vessels are normal. Posterior circulation: The vertebral arteries are codominant. PICA origins are visualized and normal. Vertebrobasilar junction is normal. Both posterior cerebral arteries originate from basilar tip. The PCA branch vessels are within normal limits. Venous sinuses: The dural sinuses are patent. The straight sinus deep cerebral veins are intact. Cortical veins are unremarkable. Anatomic variants: None Review of the MIP images confirms the above findings IMPRESSION: 1. Bilateral lower lobe pulmonary emboli. 2. 50% stenosis at the origin of the right vertebral artery. 3. Atherosclerotic changes at the carotid bifurcations bilaterally without significant stenosis. 4. No significant proximal stenosis, aneurysm, or branch vessel occlusion within the Circle of Willis. 5. Extensive atherosclerotic changes within the aortic arch and proximal descending thoracic aorta without aneurysm. 6. Multilevel spondylosis of the cervical spine with a calcified posterior longitudinal ligament at C3-4 and C4-5 resulting  in moderate central canal stenosis at these levels. Electronically Signed   By: San Morelle M.D.   On: 06/01/2019 21:00   Ct Head Wo Contrast  Result Date: 06/01/2019 CLINICAL DATA:  Focal neuro deficit for greater than 6 hours, increasing confusion and combativeness for 1 week EXAM: CT HEAD WITHOUT CONTRAST TECHNIQUE: Contiguous axial images were obtained from the base of the skull through the vertex without intravenous contrast. COMPARISON:  CT 04/06/2017 FINDINGS: Brain: Stable regions of gliosis in the right centrum semiovale, left frontal pole and left occipital lobe compatible with remote infarct. No evidence of acute infarction, hemorrhage, or hydrocephalus. Remote right parietal hygroma/chronic subdural has since resolved from comparison exam. No visible extra-axial collection or mass lesion/mass effect. Symmetric prominence of the ventricles, cisterns  and sulci compatible with parenchymal volume loss. Patchy areas of white matter hypoattenuation are most compatible with chronic microvascular angiopathy. Vascular: Atherosclerotic calcification of the carotid siphons and intradural vertebral arteries. No hyperdense vessel. Skull: No calvarial fracture or suspicious osseous lesion. No scalp swelling or hematoma. Sinuses/Orbits: Mural thickening in the anterior ethmoids and right maxillary sinus. No air-fluid levels. Orbital structures are unremarkable aside from prior lens extractions. Other: Edentulous with mandibular prognathism temporomandibular joints remain normally aligned. IMPRESSION: 1. No acute intracranial abnormality. If persisting concern for infarct, MRI is more sensitive and specific. 2. Stable remote infarcts in the right and left frontal and left occipital lobes. 3. Remote right parietal hygroma/chronic subdural has since resolved from comparison exam. 4. Chronic microvascular ischemic changes and parenchymal volume loss. Electronically Signed   By: Lovena Le M.D.   On:  06/01/2019 19:55   Ct Angio Neck W And/or Wo Contrast  Result Date: 06/01/2019 CLINICAL DATA:  Progressive confusion over the last week. Right-sided weakness for 4 days. EXAM: CT ANGIOGRAPHY HEAD AND NECK TECHNIQUE: Multidetector CT imaging of the head and neck was performed using the standard protocol during bolus administration of intravenous contrast. Multiplanar CT image reconstructions and MIPs were obtained to evaluate the vascular anatomy. Carotid stenosis measurements (when applicable) are obtained utilizing NASCET criteria, using the distal internal carotid diameter as the denominator. CONTRAST:  50mL OMNIPAQUE IOHEXOL 350 MG/ML SOLN COMPARISON:  CT head without contrast 06/01/2019. CT chest 01/18/2019 FINDINGS: CTA NECK FINDINGS Aortic arch: Extensive atherosclerotic calcifications are present in the aortic arch and proximal descending thoracic aorta. There is no aneurysm. Calcifications are greatest at the origin of the left subclavian artery. No significant stenosis is present in the great vessel origins. Right carotid system: The right common carotid artery is within normal limits. The bifurcation is unremarkable. The cervical right ICA is normal. Left carotid system: The left common carotid artery is within normal limits. Atherosclerotic calcifications are present at the bifurcation without a significant stenosis. The more distal cervical left ICA is otherwise normal. Vertebral arteries: The vertebral arteries are codominant. Both vertebral arteries originate from the subclavian arteries. Atherosclerotic calcifications are present at the origin of the right vertebral artery with a 50% stenosis. There is no significant stenosis of either vertebral artery in the neck otherwise. Skeleton: Extensive degenerative changes are present within the cervical spine. Vertebral body heights are maintained. Calcification posterior longitudinal ligament at C3-4 and C4-5 contributes to moderate central canal  stenosis at these levels. Multilevel foraminal disease is due to uncovertebral disease. No focal lytic or blastic lesions are present. The patient is edentulous. Other neck: The salivary glands and ducts. Are within No significant cervical adenopathy normal limits bilaterally is present. No focal mucosal or submucosal lesions are present. Upper chest: Centrilobular emphysematous changes are present. Mild dependent atelectasis is noted. Right inferior pulmonary embolus is noted. There is embolus extending into the left lower lobe pulmonary embolus is well. These are incompletely imaged. Right infrahilar soft tissue mass measures 2.2 x 1.4 cm. Right infrahilar adenopathy is present as well. 5 mm nodule is present posteriorly in the right upper lobe. Other smaller nodules are present in the right upper lobe as well. Review of the MIP images confirms the above findings CTA HEAD FINDINGS Anterior circulation: Atherosclerotic calcifications are present within the cavernous internal carotid arteries bilaterally without a significant stenosis relative to the more distal vessel. ICA termini are normal. The A1 and M1 segments are normal. The anterior communicating artery is patent.  MCA bifurcations are intact. ACA and MCA branch vessels are normal. Posterior circulation: The vertebral arteries are codominant. PICA origins are visualized and normal. Vertebrobasilar junction is normal. Both posterior cerebral arteries originate from basilar tip. The PCA branch vessels are within normal limits. Venous sinuses: The dural sinuses are patent. The straight sinus deep cerebral veins are intact. Cortical veins are unremarkable. Anatomic variants: None Review of the MIP images confirms the above findings IMPRESSION: 1. Bilateral lower lobe pulmonary emboli. 2. 50% stenosis at the origin of the right vertebral artery. 3. Atherosclerotic changes at the carotid bifurcations bilaterally without significant stenosis. 4. No significant  proximal stenosis, aneurysm, or branch vessel occlusion within the Circle of Willis. 5. Extensive atherosclerotic changes within the aortic arch and proximal descending thoracic aorta without aneurysm. 6. Multilevel spondylosis of the cervical spine with a calcified posterior longitudinal ligament at C3-4 and C4-5 resulting in moderate central canal stenosis at these levels. Electronically Signed   By: San Morelle M.D.   On: 06/01/2019 21:00   Mr Brain Wo Contrast  Result Date: 06/02/2019 CLINICAL DATA:  Focal neuro deficit, greater than 6 hours, stroke suspected. Multiple comorbidities. Patient started on Haldol a few days ago. New onset right upper and lower extremity weakness beginning yesterday. EXAM: MRI HEAD WITHOUT CONTRAST TECHNIQUE: Multiplanar, multiecho pulse sequences of the brain and surrounding structures were obtained without intravenous contrast. COMPARISON:  CT head without contrast and CTA head and neck 06/01/2019 FINDINGS: Brain: The diffusion-weighted images demonstrate no acute or subacute infarction. Remote anterior left frontal lobe infarct is again noted. There is associated volume loss. Confluent periventricular white matter changes are otherwise within normal limits for age. Dilated perivascular spaces are present throughout the basal ganglia. White matter changes extend into the brainstem. The cerebellum is unremarkable. No acute hemorrhage or mass lesion is present. There is no mass effect. The ventricles are proportionate to the degree of atrophy. No significant extraaxial fluid collection is present. Vascular: Flow is present in the major intracranial arteries. Skull and upper cervical spine: Mild degenerative changes are present in the upper cervical spine. Craniocervical junction is normal. Midline structures are unremarkable. Sinuses/Orbits: Anterior right ethmoid air cells are opacified. Frontal sinuses are not pneumatized. Mild mucosal thickening is present in the  right maxillary sinus. No fluid levels are present. Minimal fluid is present in the posterior mastoid air cells bilaterally. No obstructing lesions are evident. Bilateral lens replacements are present. Globes and orbits are within normal limits. IMPRESSION: 1. No acute intracranial abnormality or significant interval change. 2. Remote anterior left frontal lobe infarct. 3. Mild atrophy and white matter disease is otherwise within normal limits for age. Electronically Signed   By: San Morelle M.D.   On: 06/02/2019 10:06    Scheduled Meds:  insulin aspart  0-9 Units Subcutaneous Q4H   insulin glargine  20 Units Subcutaneous QHS   sodium chloride flush  3 mL Intravenous Q12H    Continuous Infusions:  heparin 1,300 Units/hr (06/01/19 2132)     LOS: 1 day     Kayleen Memos, MD [facility] Pager [phone]  If 7PM-7AM, please contact night-coverage [email or web address] Password [email or web address] 06/02/2019, 11:08 AM

## 2019-06-02 NOTE — Progress Notes (Signed)
EEG complete - results pending 

## 2019-06-02 NOTE — Evaluation (Signed)
Physical Therapy Evaluation Patient Details Name: [name] MRN: [id number] DOB: [birth year] Today's Date: 06/02/2019   History of Present Illness  [name] is a 85 y.o. male with medical history significant for coronary artery disease, history of CVA, insulin-dependent diabetes mellitus, hypertension, chronic kidney disease stage III and depression with behavioral disturbance, now presenting to the emergency department for evaluation of somnolence and R sided weakness. CT and MRI neg for acute findings.  Clinical Impression  Pt admitted with above diagnosis. Pt presents with mild R sided weakness but more notable lack of neuromuscular control on that side. Difficulty following commands and difficult to discern HOH vs cognitive deficit. Required mod A for bed mobility, mod A +2 for transfer bed to chair towards the L and max A +2 to transfer to the R. Recommend ST SNF for rehab to decreased caregiver burden upon return home.   Pt currently with functional limitations due to the deficits listed below (see PT Problem List). Pt will benefit from skilled PT to increase their independence and safety with mobility to allow discharge to the venue listed below.       Follow Up Recommendations SNF;Supervision/Assistance - 24 hour    Equipment Recommendations  None recommended by PT    Recommendations for Other Services       Precautions / Restrictions Precautions Precautions: Fall Precaution Comments: pt fell 2x at home before coming in Restrictions Weight Bearing Restrictions: No      Mobility  Bed Mobility Overal bed mobility: Needs Assistance Bed Mobility: Supine to Sit;Sit to Supine     Supine to sit: Mod assist Sit to supine: Mod assist   General bed mobility comments: mod A for initiation as well as LE's off bed and elevation of trunk into sitting. Mod A for LE's back into bed with return to supine  Transfers Overall transfer level: Needs assistance Equipment used: 2  person hand held assist Transfers: Sit to/from [facility] Sit to Stand: Mod assist;+2 physical assistance Stand pivot transfers: Mod assist;+2 physical assistance;Max assist       General transfer comment: mod A +2 for power up. Pt pivoted to L with mod A +2 but needed max A +2 for pivot back to R. R knee buckling and pt resistant to stepping it as well as resistant to wt shifting onto it  Ambulation/Gait             General Gait Details: unable to perform safely  Stairs            Wheelchair Mobility    Modified Rankin (Stroke Patients Only) Modified Rankin (Stroke Patients Only) Pre-Morbid Rankin Score: Moderate disability Modified Rankin: Severe disability     Balance Overall balance assessment: Needs assistance;History of Falls Sitting-balance support: Feet supported;No upper extremity supported Sitting balance-Leahy Scale: Fair Sitting balance - Comments: L lean Postural control: Left lateral lean Standing balance support: Bilateral upper extremity supported Standing balance-Leahy Scale: Poor Standing balance comment: needed mod A to stand                             Pertinent Vitals/Pain Pain Assessment: No/denies pain    Home Living Family/patient expects to be discharged to:: Private residence Living Arrangements: Children Available Help at Discharge: Family;Available 24 hours/day Type of Home: House Home Access: Stairs to enter Entrance Stairs-Rails: Can reach both;Right;Left Entrance Stairs-Number of Steps: 1 Home Layout: One level Home Equipment: Walker - 2 wheels;Cane - single  point Additional Comments: lives with daughter    Prior Function Level of Independence: Needs assistance   Gait / Transfers Assistance Needed: sometimes ambulated with cane or RW but per dauhter, usually used nothing  ADL's / Homemaking Assistance Needed: daughter helps him bathe and dress.         Hand Dominance   Dominant Hand:  Right    Extremity/Trunk Assessment   Upper Extremity Assessment Upper Extremity Assessment: Defer to OT evaluation;RUE deficits/detail;LUE deficits/detail RUE Deficits / Details: decreased strength compared to L RUE Coordination: decreased gross motor LUE Deficits / Details: L shoulder sore with motion    Lower Extremity Assessment Lower Extremity Assessment: RLE deficits/detail RLE Deficits / Details: hip flex 3/5, knee ext 3/5, ankle 3/5 however, pt lacks neuromuscular control, R knee buckling mildly in standing and pt not trusting it to wt shift to R and step L foot RLE Sensation: decreased proprioception RLE Coordination: decreased gross motor    Cervical / Trunk Assessment Cervical / Trunk Assessment: Normal  Communication   Communication: HOH(very HOH)  Cognition Arousal/Alertness: Lethargic Behavior During Therapy: Flat affect Overall Cognitive Status: Impaired/Different from baseline Area of Impairment: Problem solving;Following commands;Attention;Awareness;Safety/judgement;Memory                   Current Attention Level: Sustained Memory: Decreased short-term memory Following Commands: Follows one step commands inconsistently;Follows one step commands with increased time Safety/Judgement: Decreased awareness of deficits Awareness: Intellectual Problem Solving: Slow processing;Decreased initiation;Difficulty sequencing;Requires verbal cues;Requires tactile cues General Comments: pt slow to follow all commands and difficult to discern if it was [facility] vs neurological      General Comments General comments (skin integrity, edema, etc.): VSS on RA    Exercises     Assessment/Plan    PT Assessment Patient needs continued PT services  PT Problem List Decreased strength;Decreased range of motion;Decreased activity tolerance;Decreased balance;Decreased mobility;Decreased coordination;Decreased cognition;Decreased knowledge of use of DME;Decreased safety  awareness;Decreased knowledge of precautions;Impaired sensation       PT Treatment Interventions DME instruction;Gait training;Stair training;Functional mobility training;Therapeutic activities;Therapeutic exercise;Balance training;Neuromuscular re-education;Cognitive remediation;Patient/family education    PT Goals (Current goals can be found in the Care Plan section)  Acute Rehab PT Goals Patient Stated Goal: none stated, daughter would like him to come home PT Goal Formulation: With family Time For Goal Achievement: 06/16/19 Potential to Achieve Goals: Fair    Frequency Min 3X/week   Barriers to discharge        Co-evaluation               AM-PAC PT "6 Clicks" Mobility  Outcome Measure Help needed turning from your back to your side while in a flat bed without using bedrails?: A Lot Help needed moving from lying on your back to sitting on the side of a flat bed without using bedrails?: A Lot Help needed moving to and from a bed to a chair (including a wheelchair)?: A Lot Help needed standing up from a chair using your arms (e.g., wheelchair or bedside chair)?: A Lot Help needed to walk in hospital room?: Total Help needed climbing 3-5 steps with a railing? : Total 6 Click Score: 10    End of Session Equipment Utilized During Treatment: Gait belt Activity Tolerance: Patient limited by lethargy Patient left: in bed;with call bell/phone within reach;with nursing/sitter in room;with family/visitor present Nurse Communication: Mobility status PT Visit Diagnosis: Unsteadiness on feet (R26.81);Muscle weakness (generalized) (M62.81);History of falling (Z91.81);Difficulty in walking, not elsewhere classified (R26.2);Apraxia (R48.2);Hemiplegia and hemiparesis  Hemiplegia - Right/Left: Right Hemiplegia - dominant/non-dominant: Dominant Hemiplegia - caused by: Unspecified    Time: [id number] PT Time Calculation (min) (ACUTE ONLY): 38 min   Charges:   PT Evaluation $PT Eval  Moderate Complexity: 1 Mod PT Treatments $Therapeutic Activity: 23-37 mins        Leighton Roach, [place]  Pager [phone] Office [place] 06/02/2019, 1:55 PM

## 2019-06-02 NOTE — Progress Notes (Addendum)
BG 87 at 0000, Blount informed and inquired about starting IV fluid for NPO pt with BG in 80's, D5 at 50 cc started. Also inquired about holding night Lantus 20 units. Order not seen and Lantus admin. Blount informed and BG q1 hour ordered. Will conti to follow. Pt AOx1 and not able to give history or complete admssion screening.

## 2019-06-02 NOTE — Evaluation (Signed)
Occupational Therapy Evaluation Patient Details Name: [name] MRN: [id number] DOB: [birth year] Today's Date: 06/02/2019    History of Present Illness [name] is a 85 y.o. male with medical history significant for coronary artery disease, history of CVA, insulin-dependent diabetes mellitus, hypertension, chronic kidney disease stage III and depression with behavioral disturbance, now presenting to the emergency department for evaluation of somnolence and R sided weakness. CT and MRI of head negative for acute infarct., but whowed remote anterior Lt frontal lobe infarct.  CTA showed bil. Lower lobe PEs.     Clinical Impression   Pt admitted with above. He demonstrates the below listed deficits and will benefit from continued OT to maximize safety and independence with BADLs.  Pt presents to OT with Rt inattention/neglect, impaired balance, deficits with initiation, problem solving and attention.  He requires max A for bed mobility, and able to maintain EOB sitting with min guard assist.  He requires mod - total A for ADLs and functional transfers.  PTA, he lived with daughter who assisted him with ADLs, and he was mod I- supervision for functional mobility.  Recommend SNF level rehab at discharge.       Follow Up Recommendations  SNF;Supervision/Assistance - 24 hour    Equipment Recommendations  None recommended by OT    Recommendations for Other Services       Precautions / Restrictions Precautions Precautions: Fall Precaution Comments: pt fell 2x at home before coming in Restrictions Weight Bearing Restrictions: No      Mobility Bed Mobility Overal bed mobility: Needs Assistance Bed Mobility: Supine to Sit;Sit to Supine     Supine to sit: Max assist Sit to supine: Max assist   General bed mobility comments: Pt will initiate moving LEs toward EOB with max cues/facilitation, but requires assist to complete task, and max A to scoot hips to the EOB.  He is able to  assist with lowering trunk toward bed when returning to supine   Transfers Overall transfer level: Needs assistance Equipment used: 1 person hand held assist Transfers: Sit to/from Stand Sit to Stand: Mod assist         General transfer comment: Pt stood with mod A and facilitation at Rt hip for extension.  He frequenlty lowers back into hip flexion.  He was able to side step up toward [facility] with assist weight shift and to initiate stepping with his Rt foot     Balance Overall balance assessment: Needs assistance;History of Falls Sitting-balance support: Feet supported;No upper extremity supported Sitting balance-Leahy Scale: Fair Sitting balance - Comments: Pt able to maintain EOB sitting with close min guard assist    Standing balance support: Bilateral upper extremity supported Standing balance-Leahy Scale: Poor Standing balance comment: Pt requires mod - max A to maintain standing                            ADL either performed or assessed with clinical judgement   ADL Overall ADL's : Needs assistance/impaired Eating/Feeding: Maximal assistance;Bed level Eating/Feeding Details (indicate cue type and reason): Pt will drink from cup using Lt UE  Grooming: Wash/dry hands;Wash/dry face;Brushing hair;Maximal assistance;Sitting   Upper Body Bathing: Maximal assistance;Sitting   Lower Body Bathing: Maximal assistance;Sit to/from stand   Upper Body Dressing : Total assistance;Sitting   Lower Body Dressing: Total assistance;Sit to/from stand   Toilet Transfer: Maximal assistance;Stand-pivot;BSC   Toileting- Clothing Manipulation and Hygiene: Total assistance;Sit to/from stand  Functional mobility during ADLs: Maximal assistance       Vision   Additional Comments: Pt unable to participate in formal assessment.  Pt will look to therapist on both Lt and Rt.       Perception Perception Perception Tested?: Yes Perception Deficits:  Inattention/neglect Inattention/Neglect: Does not attend to right side of body   Praxis Praxis Praxis tested?: Deficits Deficits: Initiation;Ideomotor    Pertinent Vitals/Pain Pain Assessment: No/denies pain     Hand Dominance Right   Extremity/Trunk Assessment Upper Extremity Assessment Upper Extremity Assessment: RUE deficits/detail RUE Deficits / Details: Pt does not spontaneously attempt to use Rt UE during functional tasks, but instead attempts to use Lt UE despite being Rt handed.  he is able to intiate movement all joints during AAROM  RUE Coordination: decreased fine motor;decreased gross motor LUE Deficits / Details: Grossly WFL during functional tasks.  Pt repeatedly uses Lt UE despite being Rt handed    Lower Extremity Assessment Lower Extremity Assessment: Defer to PT evaluation   Cervical / Trunk Assessment Cervical / Trunk Assessment: Other exceptions Cervical / Trunk Exceptions: Pt demonstrates Rt lateral flexion with passive elongation Rt trunk    Communication Communication Communication: HOH(very HOH)   Cognition Arousal/Alertness: Lethargic;Awake/alert Behavior During Therapy: Flat affect Overall Cognitive Status: Difficult to assess Area of Impairment: Problem solving;Following commands;Attention;Awareness;Safety/judgement;Memory                   Current Attention Level: Sustained Memory: Decreased short-term memory Following Commands: Follows one step commands inconsistently;Follows one step commands with increased time Safety/Judgement: Decreased awareness of deficits   Problem Solving: Slow processing;Decreased initiation;Difficulty sequencing;Requires verbal cues;Requires tactile cues General Comments: Pt is very slow to initiate activty and only able to sustain attention to task for up to 20 seconds    General Comments  daughter present throughout session     Exercises     Shoulder Instructions      Home Living Family/patient  expects to be discharged to:: Private residence Living Arrangements: Children Available Help at Discharge: Family;Available 24 hours/day Type of Home: House Home Access: Stairs to enter [employer] of Steps: 1 Entrance Stairs-Rails: Can reach both;Right;Left Home Layout: One level     Bathroom Shower/Tub: Walk-in shower         Home Equipment: Environmental consultant - 2 wheels;Cane - single point   Additional Comments: lives with daughter      Prior Functioning/Environment Level of Independence: Needs assistance  Gait / Transfers Assistance Needed: sometimes ambulated with cane or RW but per dauhter, usually used nothing ADL's / Homemaking Assistance Needed: daughter helps him bathe and dress.  Pt is able to bathe upper body with min A, and was able to feed self and perform toileting mod I             OT Problem List: Decreased strength;Decreased range of motion;Decreased activity tolerance;Impaired balance (sitting and/or standing);Impaired vision/perception;Decreased coordination;Decreased cognition;Decreased safety awareness;Decreased knowledge of use of DME or AE;Impaired UE functional use;Impaired sensation      OT Treatment/Interventions: Self-care/ADL training;Neuromuscular education;DME and/or AE instruction;Therapeutic activities;Cognitive remediation/compensation;Visual/perceptual remediation/compensation;Patient/family education;Balance training    OT Goals(Current goals can be found in the care plan section) Acute Rehab OT Goals Patient Stated Goal: for him to be able to use Rt UE better and to move better  OT Goal Formulation: With patient/family Time For Goal Achievement: 06/16/19 Potential to Achieve Goals: Good ADL Goals Pt Will Perform Eating: with min assist;sitting Pt Will Perform Grooming: with mod assist;standing  Pt Will Transfer to Toilet: with mod assist;regular height toilet;bedside commode;grab bars;stand pivot transfer Pt Will Perform Toileting -  Clothing Manipulation and hygiene: with mod assist;sit to/from stand Additional ADL Goal #1: Pt will use Rt UE as a gross assist during self care tasks  OT Frequency: Min 2X/week   Barriers to D/C: Decreased caregiver support  daughter unable to provide current level of assist        Co-evaluation              AM-PAC OT "6 Clicks" Daily Activity     Outcome Measure Help from another person eating meals?: A Lot Help from another person taking care of personal grooming?: A Lot Help from another person toileting, which includes using toliet, bedpan, or urinal?: A Lot Help from another person bathing (including washing, rinsing, drying)?: A Lot Help from another person to put on and taking off regular upper body clothing?: Total Help from another person to put on and taking off regular lower body clothing?: Total 6 Click Score: 10   End of Session Equipment Utilized During Treatment: Gait belt Nurse Communication: Mobility status  Activity Tolerance: Patient tolerated treatment well Patient left: in bed;with call bell/phone within reach;with bed alarm set;with family/visitor present  OT Visit Diagnosis: Unsteadiness on feet (R26.81);Cognitive communication deficit (R41.841);Hemiplegia and hemiparesis Symptoms and signs involving cognitive functions: Cerebral infarction Hemiplegia - Right/Left: Right Hemiplegia - dominant/non-dominant: Dominant Hemiplegia - caused by: Cerebral infarction                Time: 1452-1519 OT Time Calculation (min): 27 min Charges:  OT General Charges $OT Visit: 1 Visit OT Evaluation $OT Eval Moderate Complexity: 1 Mod OT Treatments $Neuromuscular Re-education: 8-22 mins  Lucille Passy, OTR/L Acute Rehabilitation Services Pager [phone] Office [phone]   Lucille Passy M 06/02/2019, 5:08 PM

## 2019-06-02 NOTE — Progress Notes (Signed)
ANTICOAGULATION CONSULT NOTE - Initial Consult  Pharmacy Consult:  Heparin Indication: pulmonary embolus  Allergies  Allergen Reactions  . Lisinopril Cough    Patient Measurements: Height: 5\' 11"  (180.3 cm) Weight: 174 lb 6.1 oz (79.1 kg) IBW/kg (Calculated) : 75.3 Heparin Dosing Weight: 81 kg  Vital Signs: Temp: 97.7 F (36.5 C) (10/31 0508) Temp Source: Oral (10/31 0508) BP: 137/65 (10/31 0508) Pulse Rate: 76 (10/31 0508)  Labs: Recent Labs    06/01/19 1921 06/01/19 2111 06/01/19 2202 06/02/19 0030 06/02/19 0250 06/02/19 0618  HGB 11.7* 10.9*  --   --  11.6*  --   HCT 35.5* 32.0*  --   --  36.4*  --   PLT PLATELET CLUMPS NOTED ON SMEAR, UNABLE TO ESTIMATE  --   --   --  174  --   HEPARINUNFRC  --   --   --   --   --  0.81*  CREATININE 2.08* 2.00*  --   --  1.79*  --   CKTOTAL  --   --  3,868*  --   --   --   TROPONINIHS  --   --   --  51* 59*  --     Estimated Creatinine Clearance: 26.3 mL/min (A) (by C-G formula based on SCr of 1.79 mg/dL (H)).   Medical History: Past Medical History:  Diagnosis Date  . Arthritis   . CAD (coronary artery disease)    s/p DES to LAD 06/2013  . Depression   . Dyslipidemia   . Glaucoma   . Hyperglycemia 04/17/2014  . Hyperlipidemia   . Hypertension   . Stroke ([place])   . Type 2 diabetes mellitus (HCC)     Assessment: 89 YOM presented with AMS and code stroke was called initially, now canceled.  Found to have acute PE and Pharmacy consulted to dose IV heparin.  Baseline labs reviewed.  Heparin level 0.81 - supra-therapeutic will decrease rate to 1150 untits/hr and check an 8 hour heparin level  Goal of Therapy:  Heparin level 0.3-0.7 units/ml Monitor platelets by anticoagulation protocol: Yes   Plan:  Decrease heparin gtt to 1150 units/hr Check 8 hr heparin level Daily heparin level and CBC  Nicoletta Dress, PharmD PGY2 Infectious Disease Pharmacy Resident  06/02/2019, 11:38 AM

## 2019-06-02 NOTE — Progress Notes (Addendum)
ANTICOAGULATION CONSULT NOTE  Pharmacy Consult:  Heparin Indication: pulmonary embolus  Allergies  Allergen Reactions  . Lisinopril Cough    Patient Measurements: Height: 5\' 11"  (180.3 cm) Weight: 174 lb 6.1 oz (79.1 kg) IBW/kg (Calculated) : 75.3 Heparin Dosing Weight: 81 kg  Vital Signs: Temp: 99.5 F (37.5 C) (10/31 1642) Temp Source: Oral (10/31 1642) BP: 123/53 (10/31 1642) Pulse Rate: 83 (10/31 1642)  Labs: Recent Labs    06/01/19 1921 06/01/19 2111 06/01/19 2202 06/02/19 0030 06/02/19 0250 06/02/19 0618 06/02/19 1943  HGB 11.7* 10.9*  --   --  11.6*  --   --   HCT 35.5* 32.0*  --   --  36.4*  --   --   PLT PLATELET CLUMPS NOTED ON SMEAR, UNABLE TO ESTIMATE  --   --   --  174  --   --   HEPARINUNFRC  --   --   --   --   --  0.81* 0.44  CREATININE 2.08* 2.00*  --   --  1.79*  --   --   CKTOTAL  --   --  3,868*  --   --   --   --   TROPONINIHS  --   --   --  51* 59*  --   --     Estimated Creatinine Clearance: 26.3 mL/min (A) (by C-G formula based on SCr of 1.79 mg/dL (H)).  Assessment: 76 YOM presented with AMS and code stroke was called initially, now cancelled.  Found to have acute PE and Pharmacy consulted to dose IV heparin.    Heparin level is therapeutic; no bleeding reported.  Goal of Therapy:  Heparin level 0.3-0.7 units/ml Monitor platelets by anticoagulation protocol: Yes   Plan:  Continue heparin gtt at 1150 units/hr F/U AM labs  [name] D. Mina Marble, PharmD, BCPS, [place] 06/02/2019, 8:14 PM

## 2019-06-02 NOTE — Evaluation (Signed)
Clinical/Bedside Swallow Evaluation Patient Details  Name: [name] MRN: [id number] Date of Birth: [birth year]  Today's Date: 06/02/2019 Time: SLP Start Time (ACUTE ONLY): 1200 SLP Stop Time (ACUTE ONLY): 1219 SLP Time Calculation (min) (ACUTE ONLY): 19 min  Past Medical History:  Past Medical History:  Diagnosis Date  . Arthritis   . CAD (coronary artery disease)    s/p DES to LAD 06/2013  . Depression   . Dyslipidemia   . Glaucoma   . Hyperglycemia 04/17/2014  . Hyperlipidemia   . Hypertension   . Stroke ([place])   . Type 2 diabetes mellitus ([place])    Past Surgical History:  Past Surgical History:  Procedure Laterality Date  . CARDIAC CATHETERIZATION  06/2013   DES to LAD by Dr Tamala Julian  . Right Cataract     HPI:  [name], 89y/m, presented to ED with right sided weakness. PMH of CAD, h/o CVA, insulin-dependent diabetes mellitus, hypertension, CKD stage III and depression. CT and MRI were negative for acute findings.   Assessment / Plan / Recommendation Clinical Impression  Patient seen for Clinical Swallow Evaluation. He had a Barium Swallow function test 04/19/19 to assess esophageal function. He was found not to aspiration, however the barium pill did become lodged in esophagus. Recommended at that time a soft diet with thin liquids and pills whole in puree. Daughter was present for evaluation and said he has been tolerating that diet since eval. Trials of ice chips and small sips of water from cup and straw were tolerated well, however sequencial sips from cup and straw were followed by a delayed clearing of the throat. Purees were tolerated as well as a saltine cracker with no s/s of aspiration. Due to h/o of esophageal dysphagia and previous recommendations, recommend a Dys 2, thin liquid diet. Small sips and bites. Medications whole given in puree. ST to follow for diet tolerance during this hospital stay.  SLP Visit Diagnosis: Dysphagia, unspecified (R13.10)     Aspiration Risk  Mild aspiration risk    Diet Recommendation Dysphagia 2 (Fine chop);Thin liquid   Liquid Administration via: Cup;Straw Medication Administration: Whole meds with puree Supervision: Full supervision/cueing for compensatory strategies;Staff to assist with self feeding Compensations: Minimize environmental distractions;Slow rate;Small sips/bites Postural Changes: Seated upright at 90 degrees;Remain upright for at least 30 minutes after po intake    Other  Recommendations Oral Care Recommendations: Oral care BID   Follow up Recommendations None      Frequency and Duration            Prognosis Prognosis for Safe Diet Advancement: Good      Swallow Study   General Date of Onset: 06/01/19 HPI: [name], 89y/m, presented to ED with right sided weakness. PMH of CAD, h/o CVA, insulin-dependent diabetes mellitus, hypertension, CKD stage III and depression. CT and MRI were negative for acute findings. Type of Study: Bedside Swallow Evaluation Previous Swallow Assessment: Esophagus Swallow function 04/19/2019 Diet Prior to this Study: Dysphagia 3 (soft);Thin liquids Temperature Spikes Noted: No Respiratory Status: Nasal cannula History of Recent Intubation: No Behavior/Cognition: Alert;Cooperative;Pleasant mood Oral Cavity Assessment: Within Functional Limits Oral Care Completed by SLP: Yes Oral Cavity - Dentition: Edentulous Vision: (history of glaucoma) Self-Feeding Abilities: Needs assist Patient Positioning: Upright in bed Baseline Vocal Quality: Normal Volitional Cough: Weak Volitional Swallow: Able to elicit    Oral/Motor/Sensory Function Overall Oral Motor/Sensory Function: Generalized oral weakness   Ice Chips Ice chips: Within functional limits Presentation: Spoon   Thin  Liquid Thin Liquid: Impaired Presentation: Cup;Straw Pharyngeal  Phase Impairments: Throat Clearing - Delayed(with sequencial sips from cup and straw)    Nectar Thick Nectar  Thick Liquid: Not tested   Honey Thick Honey Thick Liquid: Not tested   Puree Puree: Within functional limits   Solid     Solid: Within functional limits Other Comments: Pt had a Barium Swallow function to assess esophagus and recommended soft foods (04/19/19) due to Barium Pill getting stuck.     Wynelle Bourgeois, MA CCC-SLP 06/02/2019,1:08 PM

## 2019-06-02 NOTE — Procedures (Signed)
ELECTROENCEPHALOGRAM REPORT   Patient: [name]       Room #: [id number] EEG No. ID: [id number] Age: 85 y.o.        Sex: male Referring Physician: [place] Crane Report Date:  06/02/2019        Interpreting Physician: Alexis Goodell  History: [name] is an 85 y.o. male with altered mental status  Medications:  Insulin, Heparin  Conditions of Recording:  This is a 21 channel routine scalp EEG performed with bipolar and monopolar montages arranged in accordance to the international 10/20 system of electrode placement. One channel was dedicated to EKG recording.  The patient is in the awake and drowsy states.  Description:  The waking background activity consists of a low voltage, symmetrical, fairly well organized, 8 Hz alpha activity, seen from the parieto-occipital and posterior temporal regions.  Low voltage fast activity, poorly organized, is seen anteriorly and is at times superimposed on more posterior regions.  A mixture of theta and alpha rhythms are seen from the central and temporal regions. The patient drowses with slowing to irregular, low voltage theta and beta activity.   Stage II sleep is not obtained. No epileptiform activity is noted.   Hyperventilation and intermittent photic stimulation were not performed.  IMPRESSION: Normal electroencephalogram, awake and drowsy. There are no focal lateralizing or epileptiform features.   Alexis Goodell, MD Neurology [phone] 06/02/2019, 12:11 PM

## 2019-06-02 NOTE — Progress Notes (Signed)
BG 88 at 0300, glucometer have not uploaded at this time.

## 2019-06-03 ENCOUNTER — Inpatient Hospital Stay (HOSPITAL_COMMUNITY): Payer: Medicare HMO

## 2019-06-03 DIAGNOSIS — I2699 Other pulmonary embolism without acute cor pulmonale: Secondary | ICD-10-CM

## 2019-06-03 LAB — GLUCOSE, CAPILLARY
Glucose-Capillary: 74 mg/dL (ref 70–99)
Glucose-Capillary: 163 mg/dL — ABNORMAL HIGH (ref 70–99)
Glucose-Capillary: 141 mg/dL — ABNORMAL HIGH (ref 70–99)
Glucose-Capillary: 154 mg/dL — ABNORMAL HIGH (ref 70–99)
Glucose-Capillary: 203 mg/dL — ABNORMAL HIGH (ref 70–99)

## 2019-06-03 LAB — CBC
HCT: 31.3 % — ABNORMAL LOW (ref 39.0–52.0)
Hemoglobin: 10.3 g/dL — ABNORMAL LOW (ref 13.0–17.0)
MCH: 30.6 pg (ref 26.0–34.0)
MCHC: 32.9 g/dL (ref 30.0–36.0)
MCV: 92.9 fL (ref 80.0–100.0)
Platelets: 188 10*3/uL (ref 150–400)
RBC: 3.37 MIL/uL — ABNORMAL LOW (ref 4.22–5.81)
RDW: 13.5 % (ref 11.5–15.5)
WBC: 7 10*3/uL (ref 4.0–10.5)
nRBC: 0 % (ref 0.0–0.2)

## 2019-06-03 LAB — HEPARIN LEVEL (UNFRACTIONATED): Heparin Unfractionated: 0.36 IU/mL (ref 0.30–0.70)

## 2019-06-03 LAB — TROPONIN I (HIGH SENSITIVITY): Troponin I (High Sensitivity): 33 ng/L — ABNORMAL HIGH (ref <18)

## 2019-06-03 LAB — CK: Total CK: 1095 U/L — ABNORMAL HIGH (ref 49–397)

## 2019-06-03 MED ORDER — INSULIN ASPART 100 UNIT/ML ~~LOC~~ SOLN
0.0000 [IU] | Freq: Three times a day (TID) | SUBCUTANEOUS | Status: DC
  Administered 2019-06-03: 2 [IU] via SUBCUTANEOUS
  Administered 2019-06-04: 5 [IU] via SUBCUTANEOUS
  Administered 2019-06-04: 1 [IU] via SUBCUTANEOUS
  Administered 2019-06-05: 2 [IU] via SUBCUTANEOUS
  Administered 2019-06-05: 3 [IU] via SUBCUTANEOUS
  Administered 2019-06-05: 1 [IU] via SUBCUTANEOUS

## 2019-06-03 MED ORDER — DEXTROSE IN LACTATED RINGERS 5 % IV SOLN: INTRAVENOUS | Status: DC

## 2019-06-03 MED ORDER — DEXTROSE-NACL 5-0.9 % IV SOLN: INTRAVENOUS | Status: DC

## 2019-06-03 MED ORDER — ALBUTEROL SULFATE HFA 108 (90 BASE) MCG/ACT IN AERS: 2.0000 | INHALATION_SPRAY | RESPIRATORY_TRACT | Status: DC | PRN

## 2019-06-03 MED ORDER — SODIUM CHLORIDE 0.9 % IV BOLUS
500.0000 mL | Freq: Once | INTRAVENOUS | Status: AC
  Administered 2019-06-03: 500 mL via INTRAVENOUS

## 2019-06-03 MED ORDER — ESCITALOPRAM OXALATE 10 MG PO TABS
5.0000 mg | ORAL_TABLET | Freq: Every day | ORAL | Status: DC
  Administered 2019-06-03 – 2019-06-05 (×3): 5 mg via ORAL
  Filled 2019-06-03 (×3): qty 1

## 2019-06-03 MED ORDER — ALBUTEROL SULFATE (2.5 MG/3ML) 0.083% IN NEBU: 2.5000 mg | INHALATION_SOLUTION | RESPIRATORY_TRACT | Status: DC | PRN

## 2019-06-03 MED ORDER — HALOPERIDOL 5 MG PO TABS
5.0000 mg | ORAL_TABLET | Freq: Four times a day (QID) | ORAL | Status: DC | PRN
  Administered 2019-06-05: 5 mg via ORAL
  Filled 2019-06-03 (×2): qty 1

## 2019-06-03 MED ORDER — INSULIN ASPART 100 UNIT/ML ~~LOC~~ SOLN
0.0000 [IU] | Freq: Every day | SUBCUTANEOUS | Status: DC
  Administered 2019-06-03 – 2019-06-04 (×2): 2 [IU] via SUBCUTANEOUS

## 2019-06-03 MED ORDER — SODIUM CHLORIDE 0.9 % IV SOLN
INTRAVENOUS | Status: DC
  Administered 2019-06-03 – 2019-06-05 (×2): via INTRAVENOUS

## 2019-06-03 NOTE — Progress Notes (Signed)
ANTICOAGULATION CONSULT NOTE  Pharmacy Consult:  Heparin Indication: pulmonary embolus  Allergies  Allergen Reactions  . Lisinopril Cough    Patient Measurements: Height: 5\' 11"  (180.3 cm) Weight: 174 lb 6.1 oz (79.1 kg) IBW/kg (Calculated) : 75.3 Heparin Dosing Weight: 81 kg  Vital Signs: Temp: 98.7 F (37.1 C) (11/01 0440) Temp Source: Oral (11/01 0440) BP: 135/70 (11/01 0440) Pulse Rate: 80 (11/01 0440)  Labs: Recent Labs    06/01/19 1921 06/01/19 2111 06/01/19 2202 06/02/19 0030 06/02/19 0250 06/02/19 0618 06/02/19 1943 06/03/19 0420  HGB 11.7* 10.9*  --   --  11.6*  --   --  10.3*  HCT 35.5* 32.0*  --   --  36.4*  --   --  31.3*  PLT PLATELET CLUMPS NOTED ON SMEAR, UNABLE TO ESTIMATE  --   --   --  174  --   --  188  HEPARINUNFRC  --   --   --   --   --  0.81* 0.44 0.36  CREATININE 2.08* 2.00*  --   --  1.79*  --   --   --   CKTOTAL  --   --  3,868*  --   --   --   --  1,095*  TROPONINIHS  --   --   --  51* 59*  --   --  33*    Estimated Creatinine Clearance: 26.3 mL/min (A) (by C-G formula based on SCr of 1.79 mg/dL (H)).  Assessment: 58 YOM presented with AMS and code stroke was called initially, now cancelled.  Found to have acute PE and Pharmacy consulted to dose IV heparin.    Heparin level is therapeutic 0.36; no bleeding reported.  Goal of Therapy:  Heparin level 0.3-0.7 units/ml Monitor platelets by anticoagulation protocol: Yes   Plan:  Continue heparin gtt at 1150 units/hr F/U AM labs  Nicoletta Dress, PharmD PGY2 Infectious Disease Pharmacy Resident  06/03/2019, 9:58 AM

## 2019-06-03 NOTE — Plan of Care (Signed)
Patient stable, discussed POC with patient and daughter, agreeable with plan, denies question/concerns at this time.  

## 2019-06-03 NOTE — Evaluation (Signed)
Speech Language Pathology Evaluation Patient Details Name: [name] MRN: [id number] DOB: 12-Dec-1921 Today's Date: 06/03/2019 Time: [id number] SLP Time Calculation (min) (ACUTE ONLY): 10 min  Problem List:  Patient Active Problem List   Diagnosis Date Noted  . Pulmonary embolism ([place]) 06/01/2019  . Acute renal failure superimposed on stage 3 chronic kidney disease ([place]) 06/01/2019  . Chronic diastolic CHF (congestive heart failure) ([place]) 06/01/2019  . Pain due to onychomycosis of toenails of both feet 01/23/2019  . Bilateral lower extremity edema 07/05/2017  . Pulmonary nodules 04/13/2017  . Centrilobular emphysema ([place]) 04/13/2017  . Calculus of gallbladder without cholecystitis without obstruction 04/13/2017  . Chronic cough 04/13/2017  . Adenoma of left adrenal gland 04/13/2017  . Frequent falls 04/01/2017  . Type 2 diabetes mellitus with stage 3 chronic kidney disease, with long-term current use of insulin ([place]) 04/01/2017  . History of tobacco abuse 04/01/2017  . Unsteady gait 04/01/2017  . Dysarthria 04/01/2017  . History of stroke   . Hyperlipidemia   . Moderate dementia with behavioral disturbance ([place]) 09/19/2015  . Depression 09/19/2015  . Hypertensive heart disease 09/10/2015  . Type 2 diabetes, controlled, with peripheral circulatory disorder ([place])   . Essential hypertension   . Coronary artery disease involving native coronary artery of native heart with angina pectoris ([place])   . Arthritis   . Glaucoma    Past Medical History:  Past Medical History:  Diagnosis Date  . Arthritis   . CAD (coronary artery disease)    s/p DES to LAD 06/2013  . Depression   . Dyslipidemia   . Glaucoma   . Hyperglycemia 04/17/2014  . Hyperlipidemia   . Hypertension   . Stroke ([place])   . Type 2 diabetes mellitus ([place])    Past Surgical History:  Past Surgical History:  Procedure Laterality Date  . CARDIAC CATHETERIZATION  06/2013   DES to LAD by Dr Tamala Julian  . Right Cataract      HPI:  [name] is a 85 y.o. male with medical history significant for coronary artery disease, history of CVA, insulin-dependent diabetes mellitus, hypertension, chronic kidney disease stage III and depression with behavioral disturbance, now presenting to the emergency department for evaluation of somnolence and R sided weakness. CT and MRI of head negative for acute infarct., but showed left frontal encephalomalacia.  CTA showed bil. Lower lobe PEs.     Assessment / Plan / Recommendation Clinical Impression  Pt aroused from sleep to participate in speech/language/cognitive assessment - he was alert but drifting back off to sleep frequently and required constant cues to engage.  There were no focal CN deficits noted. Speech was intelligible, but output was minimal.  He had difficulty following simple commands and answering simple biographical questions.  Oriented to self only; not elements of time or place.  Daughter present.  Recommend SLP f/u at SNF with more thorough assessment when pt is more alert/participatory.      SLP Assessment  SLP Recommendation/Assessment: All further Speech Lanaguage Pathology  needs can be addressed in the next venue of care SLP Visit Diagnosis: Cognitive communication deficit (R41.841)    Follow Up Recommendations       Frequency and Duration           SLP Evaluation Cognition  Overall Cognitive Status: Difficult to assess Arousal/Alertness: Awake/alert Orientation Level: Oriented to person;Disoriented to place;Disoriented to time Attention: Sustained Sustained Attention: Impaired Sustained Attention Impairment: Verbal basic Memory: Impaired Memory Impairment: Storage deficit Awareness: Impaired Awareness Impairment: Intellectual  impairment Problem Solving: Impaired Problem Solving Impairment: Verbal basic Safety/Judgment: Impaired       Comprehension  Auditory Comprehension Overall Auditory Comprehension: Impaired Yes/No Questions:  Impaired Basic Biographical Questions: 51-75% accurate Commands: Impaired One Step Basic Commands: 50-74% accurate Visual Recognition/Discrimination Discrimination: Not tested Reading Comprehension Reading Status: Not tested    Expression Expression Primary Mode of Expression: Verbal Verbal Expression Overall Verbal Expression: Impaired Initiation: Impaired Written Expression Dominant Hand: Right   Oral / Motor  Oral Motor/Sensory Function Overall Oral Motor/Sensory Function: Within functional limits Motor Speech Overall Motor Speech: Appears within functional limits for tasks assessed   GO                    [name] 06/03/2019, 9:27 AM  [name], [place] Office number [phone] Pager [phone]

## 2019-06-03 NOTE — Progress Notes (Signed)
VASCULAR LAB PRELIMINARY  PRELIMINARY  PRELIMINARY  PRELIMINARY  Bilateral lower extremity venous duplex completed.    Preliminary report:  See CV proc for preliminary results.  Gave Dr. Nevada Crane results.   Mckade Gurka, RVT 06/03/2019, 1:48 PM

## 2019-06-03 NOTE — Progress Notes (Addendum)
Subjective: He continues to have right sided weakness  Exam: Vitals:   06/03/19 0440 06/03/19 0900  BP: 135/70 (!) 113/52  Pulse: 80 87  Resp: 16 (!) 24  Temp: 98.7 F (37.1 C) 98.1 F (36.7 C)  SpO2: 100% 96%   Gen: In bed, NAD Resp: non-labored breathing, no acute distress Abd: soft, nt  Neuro: MS: awake, gives short answers which are hard to understand, speech is dysarthric(but at baseline per daughter) CN:VFF, EOMI Motor: 4-/5 weakness of the right arm and leg.  Sensory:decreased on the right.   MRI brain - no acute infarct.   Impression: 85 yo M With new onset right sided weakness in the setting of new PE and previous stroke.  His MRI is negative, suggesting that this is recrudescence due to his previous stroke, but without clear history of R sided weakness, I think further eval is needed.   Recommendations: 1) MRI brain, C-spine.  2) anticoagulation for PE 3) If MRI is negative, then would just pursue PT, OT and neurology will sign off.   Roland Rack, MD [facility] [phone]  If 7pm- 7am, please page neurology on call as listed in [place].

## 2019-06-03 NOTE — TOC Initial Note (Signed)
Transition of Care [facility]) - Initial/Assessment Note    Patient Details  Name: [name] MRN: [id number] Date of Birth: 1921-10-08  [facility] CM/SW Contact:    Gelene Mink, [place] Phone Number: 06/03/2019, 2:19 PM  Clinical Narrative:                  CSW met with the patient's daughter, Jazziel Fitzsimmons at bedside. CSW introduced herself and explained her role. CSW shared therapy recommendation. [name] stated she is hesitant to send her father to a SNF due to [place]. She stated she would prefer to have him home with her. She stated she works from home and would be able to provide his care. She has a rolling walker with a seat and a bedside commode at home. CSW provided her with SNF and [place] List. She stated she would like to look over the list and decide on her choices.   CSW will plan on following up on Monday for home health choice and any additional DME needs.   Expected Discharge Plan: [place] Barriers to Discharge: Continued Medical Work up   Patient Goals and CMS Choice Patient states their goals for this hospitalization and ongoing recovery are:: Pt daughter wants the patient to come home with her CMS Medicare.gov Compare Post Acute Care list provided to:: Patient Represenative (must comment) Choice offered to / list presented to : Adult Children  Expected Discharge Plan and Services Expected Discharge Plan: [place] In-house Referral: Clinical Social Work Discharge Planning Services: NA Post Acute Care Choice: Home Health Living arrangements for the past 2 months: [place]                                      Prior Living Arrangements/Services Living arrangements for the past 2 months: Single Family Home Lives with:: Adult Children Patient language and need for interpreter reviewed:: No Do you feel safe going back to the place where you live?: Yes      Need for Family Participation in  Patient Care: Yes (Comment) Care giver support system in place?: Yes (comment) Current home services: DME Criminal Activity/Legal Involvement Pertinent to Current Situation/Hospitalization: No - Comment as needed  Activities of Daily Living      Permission Sought/Granted Permission sought to share information with : Case Manager Permission granted to share information with : Yes, Verbal Permission Granted     Permission granted to share info w AGENCY: All Home Health Agencies        Emotional Assessment Appearance:: Appears stated age Attitude/Demeanor/Rapport: Unable to Assess Affect (typically observed): Unable to Assess Orientation: : Oriented to Self, Oriented to Place Alcohol / Substance Use: Not Applicable Psych Involvement: No (comment)  Admission diagnosis:  AKI (acute kidney injury) ([place]) [N17.9] Neurologic abnormality [R29.818] Acute pulmonary embolism, unspecified pulmonary embolism type, unspecified whether acute cor pulmonale present [facility]) [I26.99] Patient Active Problem List   Diagnosis Date Noted  . Pulmonary embolism ([place]) 06/01/2019  . Acute renal failure superimposed on stage 3 chronic kidney disease ([place]) 06/01/2019  . Chronic diastolic CHF (congestive heart failure) ([place]) 06/01/2019  . Pain due to onychomycosis of toenails of both feet 01/23/2019  . Bilateral lower extremity edema 07/05/2017  . Pulmonary nodules 04/13/2017  . Centrilobular emphysema ([place]) 04/13/2017  . Calculus of gallbladder without cholecystitis without obstruction 04/13/2017  . Chronic cough  04/13/2017  . Adenoma of left adrenal gland 04/13/2017  . Frequent falls 04/01/2017  . Type 2 diabetes mellitus with stage 3 chronic kidney disease, with long-term current use of insulin ([place]) 04/01/2017  . History of tobacco abuse 04/01/2017  . Unsteady gait 04/01/2017  . Dysarthria 04/01/2017  . History of stroke   . Hyperlipidemia   . Moderate dementia with behavioral disturbance ([place])  09/19/2015  . Depression 09/19/2015  . Hypertensive heart disease 09/10/2015  . Type 2 diabetes, controlled, with peripheral circulatory disorder ([place])   . Essential hypertension   . Coronary artery disease involving native coronary artery of native heart with angina pectoris ([place])   . Arthritis   . Glaucoma    PCP:  Gayland Curry, DO Pharmacy:   [place], [place] - [address] [place] [place] [address] Phone: [phone] Fax: [facility] [place], [place] [address] Phone: [phone] Fax: [phone]     Social Determinants of Health (SDOH) Interventions    Readmission Risk Interventions No flowsheet data found.

## 2019-06-03 NOTE — Progress Notes (Signed)
PROGRESS NOTE  [name] [id number] DOB: 05-Feb-1922 DOA: 06/01/2019 PCP: Gayland Curry, DO  HPI/Recap of past 24 hours: [name] is a 85 y.o. male with medical history significant for coronary artery disease, history of CVA, insulin-dependent diabetes mellitus, hypertension, chronic kidney disease stage III and depression with behavioral disturbance, now presenting to the emergency department for evaluation of somnolence and weakness.  Patient was started on Haldol a few days ago and has reportedly been very somnolent since then with multiple falls.  Patient's daughter was concerned that his right arm and leg were weaker than the left at approximately 1 PM today.  Daughter was able to arrange a telemedicine visit with the patient's PCP who recommended further evaluation in the emergency department.  Patient is unable to contribute to the history due to his clinical condition.  ED Course: Upon arrival to the ED, patient is found to be afebrile, saturating mid 90s on room air, and slightly tachycardic.  EKG features sinus tachycardia with rate 104, RBBB, and nonspecific ST-T abnormalities.  Noncontrast head CT is negative for acute findings.  CTA head and neck is concerning for bilateral lower lobe PE.  Chemistry panel features a creatinine of 2.08, up from 1.49 four days earlier.  CBC features a mild normocytic anemia and clumped platelets.  Neurology was consulted by the ED physician and the patient was started on IV heparin infusion.   06/02/19: Patient was seen and examined at his bedside this am. Hypoglycemic ovenight. Patient is alert but confused in a state of dementia. Not in distress. MRI brain no acute findings. Non productive cough on exam. PE noted incidentally during CTA head and neck study.  06/03/19: Seen and examined with his daughter at bedside. He squeezes right hand but not able to lift off right arm. Could lift off right LE partially with 4/5 strength. Elicits pain when  right shoulder is touched. Right shoulder xray ordered and will follow.  Assessment/Plan: Principal Problem:   Pulmonary embolism (HCC) Active Problems:   Type 2 diabetes, controlled, with peripheral circulatory disorder (HCC)   Essential hypertension   Coronary artery disease involving native coronary artery of native heart with angina pectoris (HCC)   Moderate dementia with behavioral disturbance (HCC)   History of stroke   Centrilobular emphysema (HCC)   Acute renal failure superimposed on stage 3 chronic kidney disease (HCC)   Chronic diastolic CHF (congestive heart failure) (HCC)   Acute metabolic encephalopathy; R sided weakness  His mentation appears improved and mor interactive today. - Presents with several days of somnolence since he was started on Haldol and then right arm and leg weakness noted by his daughter several hours prior to arrival  - Head CT with no acute findings, CTA head and neck notable for lower lobe PE's bilaterally   MRI brain no acute findings - Neurology is consulting and much appreciated   Plan to repeat MRI brain and cervical spine per neurology, will follow. Evaluated by speech therapist w recs for dysphagia 2 diet. UA, Ammonia, B12, TSH unrevealing CK >3000 RPR non reactive   Newly diagnosed Pulmonary embolism, incidentally found on CTA head and neck - CTA head and neck incidentally notable for bilateral pulmonary embolism  Non productive cough on exam Currently on hep drip until we obtain results of TTE and no planned procedures. O2 sat 100% on 1L  Newly diagnosed bilateral lower extremity DVT Seen on Doppler ultrasound of lower extremity bilateral Continue heparin drip until no planned procedures  R infrahilar soft tissue mass incidentally found on CT scan neck/Hx of multiple pulmonary nodules with HX of chronic tobacco use Obtain CT chest, abd/pelvis wo contrast to r/o malignancy  Right shoulder pain post fall R shoulder xray analgesic  as needed  Acute kidney injury superimposed on CKD III  Baseline cr appears to be  1.4 with GFR 49 - presented with SCr is 2.08 on admission, up from 1.49 four days earlier  - Check UA and urine chemistries, continue gentle IVF hydration overnight, hold losartan and torsemide, and repeat chem panel in am  Cr improving this am cr 1.79 with GFR 37 Continue to avoid nephrotoxins  Continue daily BMP Monitor urine output, none recorded  Chronic diastolic CHF  - Appears compensated  BNP wjthin normal range Start strict I&O and daily weight  Insulin-dependent DM  - A1c was 6.3% in May 2020  - Continue insulin with dose-reduction in light of increased creatinine and NPO status    Hypertension  - BP at goal  - Hold antihypertensives pending swallow screen and MRI brain   Dementia with behavioral disturbances  - Plan to resume Namenda and Aricept pending swallow screen and pharmacy med-rec   Elevated CPK Presented with CPK >3800 CPK is trending down Continue daily CPK Continue IV fluid hydration Monitor urine output and renal function  Elevated troponin likely demand ischemia in the context of newly diagnosed PE and AKI. Troponin peaked at 59 and trended down Denies anginal symptoms  Physical debility/ambulatory dysfunction PT OT to assess Fall precautions     PPE: Mask, face shield  DVT prophylaxis: IV heparin infusion  Code Status: Full code, confirmed by daughter  Family Communication:  Daughter updated at bedside on 06/03/2019 Consults called: Neurology   Disposition: Ongoing workup with new diagnosis. Possible DC once symptomatology has improved and pt is hemodynamically stable.   Objective: Vitals:   06/02/19 2015 06/03/19 0012 06/03/19 0440 06/03/19 0900  BP: 125/70 118/63 135/70 (!) 113/52  Pulse: 80 90 80 87  Resp:  16 16 (!) 24  Temp: 98.5 F (36.9 C) 98.9 F (37.2 C) 98.7 F (37.1 C) 98.1 F (36.7 C)  TempSrc: Oral Oral Oral Oral  SpO2: 98% 96%  100% 96%  Weight:      Height:        Intake/Output Summary (Last 24 hours) at 06/03/2019 1116 Last data filed at 06/02/2019 1700 Gross per 24 hour  Intake 202.85 ml  Output -  Net 202.85 ml   Filed Weights   06/02/19 0317 06/02/19 0324  Weight: 79.1 kg 79.1 kg    Exam:  . General: 85 y.o. year-old male well-developed well-nourished no acute distress.  Alert in the setting of dementia . Cardiovascular: Regular rate and rhythm no rubs or gallops no JVD . Respiratory: Clear to auscultation no wheezes  . Abdomen: Soft nontender nondistended with bowel sounds musculoskeletal: Right shoulder tenderness on palpation.  Trace lower extremity edema.  Bilateral. . Psychiatry: Mood is appropriate for condition and setting   Data Reviewed: CBC: Recent Labs  Lab 05/28/19 0600 06/01/19 1921 06/01/19 2111 06/02/19 0250 06/03/19 0420  WBC 4.0 8.1  --  8.4 7.0  NEUTROABS 2.2 5.0  --   --   --   HGB 10.4* 11.7* 10.9* 11.6* 10.3*  HCT 33.3* 35.5* 32.0* 36.4* 31.3*  MCV 97.9 94.2  --  95.0 92.9  PLT 173 PLATELET CLUMPS NOTED ON SMEAR, UNABLE TO ESTIMATE  --  174 0000000   Basic Metabolic  Panel: Recent Labs  Lab 05/28/19 0433 06/01/19 1921 06/01/19 2111 06/02/19 0250  NA 141 141 141 142  K 4.3 4.8 4.0 4.2  CL 104 100 99 102  CO2 24 29  --  25  GLUCOSE 149* 112* 97 94  BUN 18 29* 29* 29*  CREATININE 1.49* 2.08* 2.00* 1.79*  CALCIUM 9.2 9.6  --  9.4   GFR: Estimated Creatinine Clearance: 26.3 mL/min (A) (by C-G formula based on SCr of 1.79 mg/dL (H)). Liver Function Tests: Recent Labs  Lab 05/28/19 0433 06/01/19 1921  AST 41 166*  ALT 29 67*  ALKPHOS 61 50  BILITOT 0.5 1.7*  PROT 6.5 6.6  ALBUMIN 3.8 3.6   No results for input(s): LIPASE, AMYLASE in the last 168 hours. Recent Labs  Lab 06/01/19 2202  AMMONIA 29   Coagulation Profile: No results for input(s): INR, PROTIME in the last 168 hours. Cardiac Enzymes: Recent Labs  Lab 06/01/19 2202 06/03/19 0420   CKTOTAL 3,868* 1,095*   BNP (last 3 results) No results for input(s): PROBNP in the last 8760 hours. HbA1C: Recent Labs    06/02/19 0250  HGBA1C 6.3*   CBG: Recent Labs  Lab 06/02/19 1223 06/02/19 1638 06/02/19 2036 06/03/19 0420 06/03/19 0854  GLUCAP 75 175* 173* 74 163*   Lipid Profile: Recent Labs    06/02/19 0250  CHOL 134  HDL 48  LDLCALC 74  TRIG 61  CHOLHDL 2.8   Thyroid Function Tests: Recent Labs    06/01/19 2202  TSH 1.527   Anemia Panel: Recent Labs    06/01/19 2202  VITAMINB12 684   Urine analysis:    Component Value Date/Time   COLORURINE YELLOW 05/28/2019 0640   APPEARANCEUR CLEAR 05/28/2019 0640   LABSPEC 1.012 05/28/2019 0640   PHURINE 5.0 05/28/2019 0640   GLUCOSEU NEGATIVE 05/28/2019 0640   HGBUR NEGATIVE 05/28/2019 0640   BILIRUBINUR NEGATIVE 05/28/2019 0640   KETONESUR NEGATIVE 05/28/2019 0640   PROTEINUR NEGATIVE 05/28/2019 0640   UROBILINOGEN 0.2 04/17/2014 1756   NITRITE NEGATIVE 05/28/2019 0640   LEUKOCYTESUR NEGATIVE 05/28/2019 0640   Sepsis Labs: @LABRCNTIP (procalcitonin:4,lacticidven:4)  ) Recent Results (from the past 240 hour(s))  SARS CORONAVIRUS 2 (TAT 6-24 HRS) Nasopharyngeal Nasopharyngeal Swab     Status: None   Collection Time: 06/01/19  9:56 PM   Specimen: Nasopharyngeal Swab  Result Value Ref Range Status   SARS Coronavirus 2 NEGATIVE NEGATIVE Final    Comment: (NOTE) SARS-CoV-2 target nucleic acids are NOT DETECTED. The SARS-CoV-2 RNA is generally detectable in upper and lower respiratory specimens during the acute phase of infection. Negative results do not preclude SARS-CoV-2 infection, do not rule out co-infections with other pathogens, and should not be used as the sole basis for treatment or other patient management decisions. Negative results must be combined with clinical observations, patient history, and epidemiological information. The expected result is Negative. Fact Sheet for Patients:  SugarRoll.be Fact Sheet for Healthcare Providers: https://www.woods-mathews.com/ This test is not yet approved or cleared by the [place] FDA and  has been authorized for detection and/or diagnosis of SARS-CoV-2 by FDA under an Emergency Use Authorization (EUA). This EUA will remain  in effect (meaning this test can be used) for the duration of the COVID-19 declaration under Section 56 4(b)(1) of the Act, 21 U.S.C. section 360bbb-3(b)(1), unless the authorization is terminated or revoked sooner. Performed at [facility], [place] [address], [place], [place] [address]       Studies: No results  found.  Scheduled Meds: . insulin aspart  0-9 Units Subcutaneous Q4H  . insulin glargine  20 Units Subcutaneous QHS  . sodium chloride flush  3 mL Intravenous Q12H    Continuous Infusions: . heparin 1,150 Units/hr (06/02/19 1145)     LOS: 2 days     Kayleen Memos, MD [facility] Pager [phone]  If 7PM-7AM, please contact night-coverage [email or web address] Password [email or web address] 06/03/2019, 11:16 AM

## 2019-06-04 ENCOUNTER — Inpatient Hospital Stay (HOSPITAL_COMMUNITY): Payer: Medicare HMO

## 2019-06-04 ENCOUNTER — Encounter: Payer: Medicare HMO | Admitting: Internal Medicine

## 2019-06-04 ENCOUNTER — Ambulatory Visit: Payer: Medicare HMO | Admitting: Internal Medicine

## 2019-06-04 ENCOUNTER — Telehealth: Payer: Self-pay

## 2019-06-04 LAB — GLUCOSE, CAPILLARY
Glucose-Capillary: 105 mg/dL — ABNORMAL HIGH (ref 70–99)
Glucose-Capillary: 138 mg/dL — ABNORMAL HIGH (ref 70–99)
Glucose-Capillary: 101 mg/dL — ABNORMAL HIGH (ref 70–99)
Glucose-Capillary: 151 mg/dL — ABNORMAL HIGH (ref 70–99)
Glucose-Capillary: 87 mg/dL (ref 70–99)
Glucose-Capillary: 253 mg/dL — ABNORMAL HIGH (ref 70–99)
Glucose-Capillary: 224 mg/dL — ABNORMAL HIGH (ref 70–99)

## 2019-06-04 LAB — HEPARIN LEVEL (UNFRACTIONATED)
Heparin Unfractionated: 0.25 IU/mL — ABNORMAL LOW (ref 0.30–0.70)
Heparin Unfractionated: 0.34 IU/mL (ref 0.30–0.70)

## 2019-06-04 LAB — CBC
HCT: 31.4 % — ABNORMAL LOW (ref 39.0–52.0)
Hemoglobin: 10.3 g/dL — ABNORMAL LOW (ref 13.0–17.0)
MCH: 31 pg (ref 26.0–34.0)
MCHC: 32.8 g/dL (ref 30.0–36.0)
MCV: 94.6 fL (ref 80.0–100.0)
Platelets: 196 10*3/uL (ref 150–400)
RBC: 3.32 MIL/uL — ABNORMAL LOW (ref 4.22–5.81)
RDW: 13.6 % (ref 11.5–15.5)
WBC: 5.7 10*3/uL (ref 4.0–10.5)
nRBC: 0 % (ref 0.0–0.2)

## 2019-06-04 LAB — BASIC METABOLIC PANEL
Anion gap: 9 (ref 5–15)
BUN: 29 mg/dL — ABNORMAL HIGH (ref 8–23)
CO2: 30 mmol/L (ref 22–32)
Calcium: 8.7 mg/dL — ABNORMAL LOW (ref 8.9–10.3)
Chloride: 103 mmol/L (ref 98–111)
Creatinine, Ser: 1.65 mg/dL — ABNORMAL HIGH (ref 0.61–1.24)
GFR calc Af Amer: 40 mL/min — ABNORMAL LOW (ref >60)
GFR calc non Af Amer: 35 mL/min — ABNORMAL LOW (ref >60)
Glucose, Bld: 119 mg/dL — ABNORMAL HIGH (ref 70–99)
Potassium: 4.3 mmol/L (ref 3.5–5.1)
Sodium: 142 mmol/L (ref 135–145)

## 2019-06-04 LAB — CK: Total CK: 693 U/L — ABNORMAL HIGH (ref 49–397)

## 2019-06-04 LAB — FOLATE RBC
Folate, Hemolysate: 449 ng/mL
Folate, RBC: 1352 ng/mL (ref >498)
Hematocrit: 33.2 % — ABNORMAL LOW (ref 37.5–51.0)

## 2019-06-04 MED ORDER — APIXABAN 5 MG PO TABS: ORAL_TABLET | ORAL | 0 refills | Status: DC

## 2019-06-04 MED ORDER — SIMVASTATIN 20 MG PO TABS: 20.0000 mg | ORAL_TABLET | Freq: Every day | ORAL | Status: DC

## 2019-06-04 MED ORDER — APIXABAN 5 MG PO TABS
10.0000 mg | ORAL_TABLET | Freq: Two times a day (BID) | ORAL | Status: DC
  Administered 2019-06-04 – 2019-06-05 (×2): 10 mg via ORAL
  Filled 2019-06-04 (×2): qty 2

## 2019-06-04 MED ORDER — TAMSULOSIN HCL 0.4 MG PO CAPS
0.4000 mg | ORAL_CAPSULE | Freq: Every day | ORAL | Status: DC
  Administered 2019-06-04 – 2019-06-05 (×2): 0.4 mg via ORAL
  Filled 2019-06-04 (×2): qty 1

## 2019-06-04 MED ORDER — APIXABAN 5 MG PO TABS: 5.0000 mg | ORAL_TABLET | Freq: Two times a day (BID) | ORAL | Status: DC

## 2019-06-04 MED ORDER — ASPIRIN EC 81 MG PO TBEC
81.0000 mg | DELAYED_RELEASE_TABLET | Freq: Every day | ORAL | Status: DC
  Administered 2019-06-04 – 2019-06-05 (×2): 81 mg via ORAL
  Filled 2019-06-04 (×2): qty 1

## 2019-06-04 NOTE — Progress Notes (Signed)
  Speech Language Pathology Treatment: Dysphagia  Patient Details Name: [name] MRN: [id number] DOB: 1922/07/16 Today's Date: 06/04/2019 Time: 3300-7622 SLP Time Calculation (min) (ACUTE ONLY): 10 min  Assessment / Plan / Recommendation Clinical Impression  F/u after initial swallow assessment.  Pt sitting in recliner after just having worked with PT; daughter present and feeding her father.  Pt with excellent appetite.  He demonstrates no concerns for aspiration, and drank 4 oz of water consecutively with no signs of airway compromise.  His daughter verbalizes that the current dysphagia 2 recommendations are best in the long-term; pt is able to masticate that consistency effectively (he is edentulous).  Demonstrates adequate bolus anticipation and manipulation; brisk swallow response; no oral residue post-swallow.  No further SLP needs identified - continue dysphagia 2, thin liquids; meds whole or crushed in puree.  Pt's daughter in agreement.  Our service will sign off.   HPI HPI: [name] is a 85 y.o. male with medical history significant for coronary artery disease, history of CVA, insulin-dependent diabetes mellitus, hypertension, chronic kidney disease stage III and depression with behavioral disturbance, now presenting to the emergency department for evaluation of somnolence and R sided weakness. CT and MRI of head negative for acute infarct., but showed left frontal encephalomalacia.  CTA showed bil. Lower lobe PEs.        SLP Plan  All goals met       Recommendations  Diet recommendations: Dysphagia 2 (fine chop);Thin liquid Liquids provided via: Cup;Straw Medication Administration: Whole meds with puree Supervision: Staff to assist with self feeding Compensations: Minimize environmental distractions;Slow rate;Small sips/bites Postural Changes and/or Swallow Maneuvers: Seated upright 90 degrees                Oral Care Recommendations: Oral care BID Follow up  Recommendations: None SLP Visit Diagnosis: Dysphagia, oropharyngeal phase (R13.12) Plan: All goals met       GO               Zellie Jenning L. Tivis Ringer, [place] Office number [phone] Pager [phone]  Juan Quam Laurice 06/04/2019, 11:33 AM

## 2019-06-04 NOTE — TOC Benefit Eligibility Note (Signed)
Transition of Care [facility]) Benefit Eligibility Note    Patient Details  Name: [name] MRN: [id number] Date of Birth: 11/27/1921   Medication/Dose: Eliquis mg 2 times a day for 30 days  Covered?: Yes  Tier: 3 Drug  Prescription Coverage Preferred Pharmacy: Any retail Pharmacy  Spoke with Person/Company/Phone Number:: Mark[employer] [facility] [employer] [employer] [phone]  Co-Pay: No Co-pay  Prior Approval: No  Deductible: Met       Orbie Pyo Phone Number: 06/04/2019, 2:06 PM

## 2019-06-04 NOTE — Progress Notes (Signed)
This encounter was created in error - please disregard.

## 2019-06-04 NOTE — Consult Note (Signed)
Reason for Consult:cervical stenosis Referring Physician: Wwlliam, [name] is an 85 y.o. male.  HPI: whom was found down at home prior to admission. He had been falling and recently he has been falling. The stenosis is obviously longstanding and his symptoms seem to be acute. MRi showed stenosis, I am asked to render an opinion with regards to possible surgery.   Past Medical History:  Diagnosis Date  . Arthritis   . CAD (coronary artery disease)    s/p DES to LAD 06/2013  . Depression   . Dyslipidemia   . Glaucoma   . Hyperglycemia 04/17/2014  . Hyperlipidemia   . Hypertension   . Stroke ([place])   . Type 2 diabetes mellitus ([place])     Past Surgical History:  Procedure Laterality Date  . CARDIAC CATHETERIZATION  06/2013   DES to LAD by Dr Tamala Julian  . Right Cataract      Family History  Problem Relation Age of Onset  . Arthritis Mother   . Breast cancer Mother   . Arthritis Father   . Dementia Father   . Diabetes Other   . Arthritis Other   . Colon cancer Other   . Stroke Other   . Diabetes type II Sister   . Suicidality Brother   . Diabetes type I Brother   . Diabetes type I Brother   . Diabetes type I Brother   . Diabetes type II Sister   . Breast cancer Daughter   . Emphysema Daughter     Social History:  reports that he quit smoking about 50 years ago. His smoking use included cigarettes. He has never used smokeless tobacco. He reports that he does not drink alcohol or use drugs.  Allergies:  Allergies  Allergen Reactions  . Lisinopril Cough    Medications: I have reviewed the patient's current medications.  Results for orders placed or performed during the hospital encounter of 06/01/19 (from the past 48 hour(s))  Glucose, capillary     Status: Abnormal   Collection Time: 06/02/19  4:38 PM  Result Value Ref Range   Glucose-Capillary 175 (H) 70 - 99 mg/dL  Heparin level (unfractionated)     Status: None   Collection Time: 06/02/19  7:43 PM   Result Value Ref Range   Heparin Unfractionated 0.44 0.30 - 0.70 IU/mL    Comment: (NOTE) If heparin results are below expected values, and patient dosage has  been confirmed, suggest follow up testing of antithrombin III levels. Performed at [facility], [place] [address], [place], [place] [address]   Glucose, capillary     Status: Abnormal   Collection Time: 06/02/19  8:36 PM  Result Value Ref Range   Glucose-Capillary 173 (H) 70 - 99 mg/dL   Comment 1 Notify RN    Comment 2 Document in Chart   Heparin level (unfractionated)     Status: None   Collection Time: 06/03/19  4:20 AM  Result Value Ref Range   Heparin Unfractionated 0.36 0.30 - 0.70 IU/mL    Comment: (NOTE) If heparin results are below expected values, and patient dosage has  been confirmed, suggest follow up testing of antithrombin III levels. Performed at [facility], [place] [address] [place] [address]   CBC     Status: Abnormal   Collection Time: 06/03/19  4:20 AM  Result Value Ref Range   WBC 7.0 4.0 - 10.5 K/uL   RBC 3.37 (L) 4.22 - 5.81 MIL/uL  Hemoglobin 10.3 (L) 13.0 - 17.0 g/dL   HCT 31.3 (L) 39.0 - 52.0 %   MCV 92.9 80.0 - 100.0 fL   MCH 30.6 26.0 - 34.0 pg   MCHC 32.9 30.0 - 36.0 g/dL   RDW 13.5 11.5 - 15.5 %   Platelets 188 150 - 400 K/uL   nRBC 0.0 0.0 - 0.2 %    Comment: Performed at [facility] [address] [place], [place] [address]  CK     Status: Abnormal   Collection Time: 06/03/19  4:20 AM  Result Value Ref Range   Total CK 1,095 (H) 49 - 397 U/L    Comment: Performed at [facility], [place] [address] [place], [place] [address]  Glucose, capillary     Status: None   Collection Time: 06/03/19  4:20 AM  Result Value Ref Range   Glucose-Capillary 74 70 - 99 mg/dL   Comment 1 Notify RN    Comment 2 Document in Chart   Troponin I (High Sensitivity)     Status: Abnormal   Collection Time: 06/03/19  4:20 AM  Result Value Ref Range   Troponin I (High  Sensitivity) 33 (H) <18 ng/L    Comment: (NOTE) Elevated high sensitivity troponin I (hsTnI) values and significant  changes across serial measurements may suggest ACS but many other  chronic and acute conditions are known to elevate hsTnI results.  Refer to the Links section for chest pain algorithms and additional  guidance. Performed at [facility], [place] [address], [place], [place] [address]   Glucose, capillary     Status: Abnormal   Collection Time: 06/03/19  8:54 AM  Result Value Ref Range   Glucose-Capillary 163 (H) 70 - 99 mg/dL   Comment 1 Notify RN    Comment 2 Document in Chart   Glucose, capillary     Status: Abnormal   Collection Time: 06/03/19 10:58 AM  Result Value Ref Range   Glucose-Capillary 141 (H) 70 - 99 mg/dL   Comment 1 Notify RN    Comment 2 Document in Chart   Glucose, capillary     Status: Abnormal   Collection Time: 06/03/19  4:41 PM  Result Value Ref Range   Glucose-Capillary 154 (H) 70 - 99 mg/dL  Glucose, capillary     Status: Abnormal   Collection Time: 06/03/19  8:36 PM  Result Value Ref Range   Glucose-Capillary 203 (H) 70 - 99 mg/dL  Heparin level (unfractionated)     Status: Abnormal   Collection Time: 06/04/19  5:41 AM  Result Value Ref Range   Heparin Unfractionated 0.25 (L) 0.30 - 0.70 IU/mL    Comment: (NOTE) If heparin results are below expected values, and patient dosage has  been confirmed, suggest follow up testing of antithrombin III levels. Performed at [facility], [place] [address], [place], [place] [address]   CBC     Status: Abnormal   Collection Time: 06/04/19  5:41 AM  Result Value Ref Range   WBC 5.7 4.0 - 10.5 K/uL   RBC 3.32 (L) 4.22 - 5.81 MIL/uL   Hemoglobin 10.3 (L) 13.0 - 17.0 g/dL   HCT 31.4 (L) 39.0 - 52.0 %   MCV 94.6 80.0 - 100.0 fL   MCH 31.0 26.0 - 34.0 pg   MCHC 32.8 30.0 - 36.0 g/dL   RDW 13.6 11.5 - 15.5 %   Platelets 196 150 - 400 K/uL   nRBC 0.0  0.0 - 0.2 %    Comment: Performed at  [facility], [place] [address], [place], [place] [id number]  Basic metabolic panel     Status: Abnormal   Collection Time: 06/04/19  5:41 AM  Result Value Ref Range   Sodium 142 135 - 145 mmol/L   Potassium 4.3 3.5 - 5.1 mmol/L   Chloride 103 98 - 111 mmol/L   CO2 30 22 - 32 mmol/L   Glucose, Bld 119 (H) 70 - 99 mg/dL   BUN 29 (H) 8 - 23 mg/dL   Creatinine, Ser 1.65 (H) 0.61 - 1.24 mg/dL   Calcium 8.7 (L) 8.9 - 10.3 mg/dL   GFR calc non Af Amer 35 (L) >60 mL/min   GFR calc Af Amer 40 (L) >60 mL/min   Anion gap 9 5 - 15    Comment: Performed at [facility] [address], [place], [place] [address]  CK     Status: Abnormal   Collection Time: 06/04/19  5:41 AM  Result Value Ref Range   Total CK 693 (H) 49 - 397 U/L    Comment: Performed at [facility], [place] [address], [place], [place] [address]  Glucose, capillary     Status: Abnormal   Collection Time: 06/04/19  6:02 AM  Result Value Ref Range   Glucose-Capillary 105 (H) 70 - 99 mg/dL  Glucose, capillary     Status: Abnormal   Collection Time: 06/04/19 12:00 PM  Result Value Ref Range   Glucose-Capillary 138 (H) 70 - 99 mg/dL   Comment 1 Notify RN    Comment 2 Document in Chart     Ct Chest Wo Contrast  Result Date: 06/04/2019 CLINICAL DATA:  Adenopathy EXAM: CT CHEST WITHOUT CONTRAST TECHNIQUE: Multidetector CT imaging of the chest was performed following the standard protocol without IV contrast. COMPARISON:  January 18, 2019 CT of the chest FINDINGS: Cardiovascular: The heart size is normal. Coronary artery calcifications are noted. There are atherosclerotic calcifications of the thoracic aorta. There is no significant pericardial effusion. Mediastinum/Nodes: --No mediastinal or hilar lymphadenopathy. --No axillary lymphadenopathy. --No supraclavicular lymphadenopathy. --Normal thyroid gland. --The esophagus is unremarkable Lungs/Pleura: Again identified are scattered bilateral pleural based pulmonary nodules. The  overall distribution in size of these nodules is similar to prior CT in June of 2020. There is a small right-sided pleural effusion, significantly decreased in size from prior study in June. There is some mucous plugging and bronchial wall thickening at the lung bases, right worse than left Upper Abdomen: No acute abnormality. Musculoskeletal: No chest wall abnormality. No acute or significant osseous findings. There is asymmetric gynecomastia involving the right chest wall. Review of the MIP images confirms the above findings. IMPRESSION: 1. No pathologically enlarged lymph nodes. 2. Again noted are multiple pleural based nodules throughout the right lung, relatively stable from prior CT in June of 2020. However, these have progressed since CT in 2018. Differential considerations include noncalcified pleural based plaques, pleural metastases, or primary pleural neoplasm such as mesothelioma. 3. Small right-sided pleural effusion, significantly improved from prior study. Aortic Atherosclerosis (ICD10-I70.0). Electronically Signed   By: Constance Holster M.D.   On: 06/04/2019 05:27   Mr Brain Wo Contrast  Result Date: 06/04/2019 CLINICAL DATA:  New onset right-sided weakness in the setting of pulmonary embolism EXAM: MRI HEAD WITHOUT CONTRAST TECHNIQUE: Multiplanar, multiecho pulse sequences of the brain and surrounding structures were obtained without intravenous contrast. COMPARISON:  Two days ago FINDINGS: Brain:  No acute infarction, hemorrhage, hydrocephalus, extra-axial collection or mass lesion. Moderate area of encephalomalacia in the anterior left frontal lobe, best attributed to chronic infarct. Minimal small vessel ischemic change in the white matter and pons for age. Cerebral volume loss without specific pattern, in keeping with age. Vascular: Normal flow voids Skull and upper cervical spine: Mild right-sided scalp swelling. Normal marrow signal Sinuses/Orbits: Opacified right anterior ethmoid air  cells with some volume loss. Bilateral cataract resection IMPRESSION: 1. No acute finding including infarct. 2. Aging brain with remote left frontal infarct. Electronically Signed   By: Monte Fantasia M.D.   On: 06/04/2019 05:19   Mr Cervical Spine Wo Contrast  Result Date: 06/04/2019 CLINICAL DATA:  Acute or progressive myelopathy EXAM: MRI CERVICAL SPINE WITHOUT CONTRAST TECHNIQUE: Multiplanar, multisequence MR imaging of the cervical spine was performed. No intravenous contrast was administered. COMPARISON:  None. FINDINGS: Alignment: Straightening of the cervical spine. No traumatic malalignment Vertebrae: No occult fracture, discitis, or aggressive bone lesion. Cord: Degenerative impingement at C3-4 and C5-6, but no detected cord signal abnormality. Motion is a limiting factor Posterior Fossa, vertebral arteries, paraspinal tissues: Thin layer ofprevertebral edema. Interspinous STIR hyperintensity at C6-7 and to a lesser extent at C5-6, without major ligamentous disruption or fracture on preceding CTA. Disc levels: C2-3: Mild asymmetric left facet spurring. C3-4: Advanced disc narrowing with downward pointing disc protrusion flattening the ventral cord. Biforaminal impingement, greater on the left, due to disc height loss and uncovertebral ridging with bulging. C4-5: Advanced disc narrowing with endplate and uncovertebral ridging and disc bulging. Spinal stenosis with disc osteophyte complex contacting the ventral cord. Biforaminal impingement greater on the left C5-6: Disc narrowing and bulging with superimposed central and right foraminal protrusion. There is spinal stenosis with ventral cord flattening. Advanced right foraminal impingement C6-7: Disc narrowing and bulging with a left paracentral protrusion and buttressing osteophytes. Uncovertebral spurring on both sides. Spinal stenosis with disc osteophyte complex contacting the ventral cord. The left foraminal protrusion deforms the C7 nerve root on  sagittal images. There is also right foraminal impingement C7-T1:Disc narrowing and bulging with a left foraminal protrusion causing advanced foraminal impingement. IMPRESSION: 1. Mild prevertebral and interspinous fluid signal suggesting strain. No occult fracture or visible cord contusion. 2. Diffuse degenerative disease with variable foraminal impingement from C3-4 to C7-T1, as above. 3. Degenerative ventral cord flattening at C3-4 and C5-6. Electronically Signed   By: Monte Fantasia M.D.   On: 06/04/2019 05:28   Dg Shoulder Right Port  Result Date: 06/03/2019 CLINICAL DATA:  RIGHT shoulder pain, no known injury EXAM: PORTABLE RIGHT SHOULDER COMPARISON:  None FINDINGS: Osseous demineralization. Degenerative changes at [facility] joint with inferior acromial and clavicular spur formation. No acute fracture, dislocation, or bone destruction. Minimal chondrocalcinosis at shoulder joint. Visualized ribs intact. IMPRESSION: AC joint degenerative changes with inferior acromial and clavicular spur formation which may predispose the patient to rotator cuff pathology. Chondrocalcinosis at shoulder joint question CPPD. No acute abnormalities. Electronically Signed   By: Lavonia Dana M.D.   On: 06/03/2019 18:10   Vas [place] Lower Extremity Venous (dvt)  Result Date: 06/03/2019  Lower Venous Study Indications: Pulmonary embolism.  Comparison Study: No prior study on file Performing Technologist: Sharion Dove RVS  Examination Guidelines: A complete evaluation includes B-mode imaging, spectral Doppler, color Doppler, and power Doppler as needed of all accessible portions of each vessel. Bilateral testing is considered an integral part of a complete examination. Limited examinations for reoccurring indications may be performed as noted.  +---------+---------------+---------+-----------+----------+--------------+  RIGHT    CompressibilityPhasicitySpontaneityPropertiesThrombus Aging  +---------+---------------+---------+-----------+----------+--------------+ CFV      Full           Yes      Yes                                 +---------+---------------+---------+-----------+----------+--------------+ SFJ      Full                                                        +---------+---------------+---------+-----------+----------+--------------+ FV Prox  Full                                                        +---------+---------------+---------+-----------+----------+--------------+ FV Mid   Full                                                        +---------+---------------+---------+-----------+----------+--------------+ FV DistalFull                                                        +---------+---------------+---------+-----------+----------+--------------+ PFV      Full                                                        +---------+---------------+---------+-----------+----------+--------------+ POP      Full           Yes      Yes                                 +---------+---------------+---------+-----------+----------+--------------+ PTV      None           No       No                                  +---------+---------------+---------+-----------+----------+--------------+ PERO     None           No       No                                  +---------+---------------+---------+-----------+----------+--------------+   +---------+---------------+---------+-----------+----------+--------------+ LEFT     CompressibilityPhasicitySpontaneityPropertiesThrombus Aging +---------+---------------+---------+-----------+----------+--------------+ CFV      Full           Yes      Yes                                 +---------+---------------+---------+-----------+----------+--------------+  SFJ      Full                                                         +---------+---------------+---------+-----------+----------+--------------+ FV Prox  Full                                                        +---------+---------------+---------+-----------+----------+--------------+ FV Mid   Full                                                        +---------+---------------+---------+-----------+----------+--------------+ FV DistalFull                                                        +---------+---------------+---------+-----------+----------+--------------+ PFV      Full                                                        +---------+---------------+---------+-----------+----------+--------------+ POP      Full           Yes      Yes                                 +---------+---------------+---------+-----------+----------+--------------+ PTV      None                                         Acute          +---------+---------------+---------+-----------+----------+--------------+ PERO     None                                         Acute          +---------+---------------+---------+-----------+----------+--------------+     *See table(s) above for measurements and observations. Electronically signed by Deitra Mayo MD on 06/03/2019 at 2:46:05 PM.    Final     Review of Systems  Unable to perform ROS: Dementia   Blood pressure 122/63, pulse 79, temperature 98.2 F (36.8 C), resp. rate 18, height 5\' 11"  (1.803 m), weight 79 kg, SpO2 100 %. Physical Exam  Constitutional: He appears well-developed and well-nourished.  HENT:  Head: Normocephalic.  Eyes: Pupils are equal, round, and reactive to light. EOM are normal.  Neurological: He is alert. He is disoriented. GCS eye subscore is 4. GCS verbal subscore is 5. GCS motor subscore is 6.  Demented. Weak on right side Unable  to do good exam due to dementia. Does not follow all commands.  Would not squeeze with his right hand.      Assessment/Plan: No acute intervention indicated. He is not an operative candidate at this time. Will happily see him as an outpatient for further assessment. You may start eliquis at any time.   [name] 06/04/2019, 1:14 PM

## 2019-06-04 NOTE — Discharge Instructions (Addendum)
Bleeding Precautions When on Anticoagulant Therapy, Adult °Anticoagulant therapy, also called blood thinner therapy, is medicine that helps to prevent and treat blood clots. The medicine works by stopping blood clots from forming or growing. Blood clots that form in your blood vessels can be dangerous. They can break loose and travel to the heart, lungs, or brain. This increases the risk of a heart attack, stroke, or blocked lung artery (pulmonary embolism). °Anticoagulants also increase the risk of bleeding. Try to protect yourself from cuts and other injuries that can cause bleeding. It is important to take anticoagulants exactly as told by your health care provider. °Why do I need to be on anticoagulant therapy? °You may need this medicine if you are at risk of developing a blood clot. Conditions that increase your risk of a blood clot include: °· Being born with heart disease or a heart malformation (congenital heart disease). °· Developing heart disease. °· Having had surgery, such as valve replacement. °· Having had a serious accident or other type of severe injury (trauma). °· Having certain types of cancer. °· Having certain diseases that can increase blood clotting. °· Having a high risk of stroke or heart attack. °· Having atrial fibrillation (AF). °What are the common anticoagulant medicines? °There are several types of anticoagulant medicines. The most common types are: °· Medicines that you take by mouth (oral medicines), such as: °? Warfarin. °? Novel oral anticoagulants (NOACs), such as: °? Direct thrombin inhibitors (dabigatran). °? Factor Xa inhibitors (apixaban, edoxaban, and rivaroxaban). °· Injections, such as: °? Unfractionated heparin. °? Low molecular weight heparin. °These anticoagulants work in different ways to prevent blood clots. They also have different risks and side effects. °What do I need to remember while on anticoagulant therapy? °Taking anticoagulants °· Take your medicine at the  same time every day. If you forget to take your medicine, take it as soon as you remember. Do not double your dosage of medicine if you miss a whole day. Take your normal dose and call your health care provider. °· Do not stop taking your medicine unless your health care provider approves. Stopping the medicine can increase your risk of developing a blood clot. °Taking other medicines °· Take over-the-counter and prescriptions medicines only as told by your health care provider. °· Do not take over-the-counter NSAIDs, including aspirin and ibuprofen, while you are on anticoagulant therapy. These medicines increase your risk of dangerous bleeding. °· Get approval from your health care provider before you start taking any new medicines, vitamins, or herbal products. Some of these could interfere with your therapy. °General instructions °· Keep all follow-up visits as told by your health care provider. This is important. °· If you are pregnant or trying to get pregnant, talk with a health care provider about anticoagulants. Some of these medicines are not safe to take during pregnancy. °· Tell all health care providers, including your dentist, that you are on anticoagulant therapy. It is especially important to tell providers before you have any surgery, medical procedures, or dental work done. °What precautions should I take? ° °· Be very careful when using knives, scissors, or other sharp objects. °· Use an electric razor instead of a blade. °· Do not use toothpicks. °· Use a soft-bristled toothbrush. Brush your teeth gently. °· Always wear shoes outdoors and wear slippers indoors. °· Be careful when cutting your fingernails and toenails. °· Place bath mats in the bathroom. If possible, install handrails as well. °· Wear gloves while you do   yard work.  Wear your seat belt.  Prevent falls by removing loose rugs and extension cords from areas where you walk. Use a cane or walker if you need it.  Avoid  constipation by: ? Drinking enough fluid to keep your urine clear or pale yellow. ? Eating foods that are high in fiber, such as fresh fruits and vegetables, whole grains, and beans. ? Limiting foods that are high in fat and processed sugars, such as fried and sweet foods.  Do not play contact sports or participate in other activities that have a high risk for injury. What other precautions are important if on warfarin therapy? If you are taking a type of anticoagulant called warfarin, make sure you:  Work with a diet and nutrition specialist (dietitian) to make an eating plan. Do not make any sudden changes to your diet after you have started your eating plan.  Do not drink alcohol. It can interfere with your medicine and increase your risk of an injury that causes bleeding.  Get regular blood tests as told by your health care provider. What are some questions to ask my health care provider?  Why do I need anticoagulant therapy?  What is the best anticoagulant therapy for my condition?  How long will I need anticoagulant therapy?  What are the side effects of anticoagulant therapy?  When should I take my medicine? What should I do if I forget to take it?  Will I need to have regular blood tests?  Do I need to change my diet? Are there foods or drinks that I should avoid?  What activities are safe for me?  What should I do if I want to get pregnant? Contact a health care provider if:  You miss a dose of medicine: ? And you are not sure what to do. ? For more than one day.  You have: ? Menstrual bleeding that is heavier than normal. ? Bloody or brown urine. ? Easy bruising. ? Black and tarry stool or bright red stool. ? Side effects from your medicine.  You feel weak or dizzy.  You become pregnant. Get help right away if:  You have bleeding that will not stop within 20 minutes from: ? The nose. ? The gums. ? A cut on the skin.  You have a severe headache or  stomachache.  You vomit or cough up blood.  You fall or hit your head. Summary  Anticoagulant therapy, also called blood thinner therapy, is medicine that helps to prevent and treat blood clots.  Anticoagulants work in different ways to prevent blood clots. They also have different risks and side effects.  Talk with your health care provider about any precautions that you should take while on anticoagulant therapy. This information is not intended to replace advice given to you by your health care provider. Make sure you discuss any questions you have with your health care provider. Document Released: 06/30/2015 Document Revised: 11/08/2018 Document Reviewed: 10/05/2016 Elsevier Patient Education  [place].  Pulmonary Nodule A pulmonary nodule is tissue that has grown on your lung. A nodule may be cancer, but most nodules are not cancer. Follow these instructions at home:   Take over-the-counter and prescription medicines only as told by your doctor.  Do not use any products that have nicotine or tobacco, such as cigarettes and e-cigarettes. If you need help quitting, ask your doctor.  Keep all follow-up visits as told by your doctor. This is important. Contact a doctor if:  You have trouble breathing when doing activities.  You feel sick.  You feel more tired than normal.  You do not feel like eating.  You lose weight without trying.  You have chills.  You have night sweats. Get help right away if:  You cannot catch your breath.  You start making whistling sounds when breathing (wheezing).  You cannot stop coughing.  You cough up blood.  You get dizzy.  You feel like you are going to pass out (faint).  You have sudden chest pain.  You have a fever or symptoms for more than 2-3 days.  You have a fever and your symptoms suddenly get worse. Summary  A pulmonary nodule is tissue that has grown on your lung.  Most nodules are not cancer.  Your  doctor will do tests to know what kind of nodule you have, and whether you need treatment for it. This information is not intended to replace advice given to you by your health care provider. Make sure you discuss any questions you have with your health care provider. Document Released: 08/21/2010 Document Revised: 08/12/2017 Document Reviewed: 08/17/2016 Elsevier Patient Education  2020 [place].  Deep Vein Thrombosis  Deep vein thrombosis (DVT) is a condition in which a blood clot forms in a deep vein, such as a lower leg, thigh, or arm vein. A clot is blood that has thickened into a gel or solid. This condition is dangerous. It can lead to serious and even life-threatening complications if the clot travels to the lungs and causes a blockage (pulmonary embolism). It can also damage veins in the leg. This can result in leg pain, swelling, discoloration, and sores (post-thrombotic syndrome). What are the causes? This condition may be caused by:  A slowdown of blood flow.  Damage to a vein.  A condition that causes blood to clot more easily, such as an inherited clotting disorder. What increases the risk? The following factors may make you more likely to develop this condition:  Being overweight.  Being older, especially over age 72.  Sitting or lying down for more than four hours.  Being in the hospital.  Lack of physical activity (sedentary lifestyle).  Pregnancy, being in childbirth, or having recently given birth.  Taking medicines that contain estrogen, such as medicines to prevent pregnancy.  Smoking.  A history of any of the following: ? Blood clots or a blood clotting disease. ? Peripheral vascular disease. ? Inflammatory bowel disease. ? Cancer. ? Heart disease. ? Genetic conditions that affect how your blood clots, such as Factor V Leiden mutation. ? Neurological diseases that affect your legs (leg paresis). ? A recent injury, such as a car accident. ? Major  or lengthy surgery. ? A central line placed inside a large vein. What are the signs or symptoms? Symptoms of this condition include:  Swelling, pain, or tenderness in an arm or leg.  Warmth, redness, or discoloration in an arm or leg. If the clot is in your leg, symptoms may be more noticeable or worse when you stand or walk. Some people may not develop any symptoms. How is this diagnosed? This condition is diagnosed with:  A medical history and physical exam.  Tests, such as: ? Blood tests. These are done to check how well your blood clots. ? Ultrasound. This is done to check for clots. ? Venogram. For this test, contrast dye is injected into a vein and X-rays are taken to check for any clots. How is this treated? Treatment  for this condition depends on:  The cause of your DVT.  Your risk for bleeding or developing more clots.  Any other medical conditions that you have. Treatment may include:  Taking a blood thinner (anticoagulant). This type of medicine prevents clots from forming. It may be taken by mouth, injected under the skin, or injected through an IV (catheter).  Injecting clot-dissolving medicines into the affected vein (catheter-directed thrombolysis).  Having surgery. Surgery may be done to: ? Remove the clot. ? Place a filter in a large vein to catch blood clots before they reach the lungs. Some treatments may be continued for up to six months. Follow these instructions at home: If you are taking blood thinners:  Take the medicine exactly as told by your health care provider. Some blood thinners need to be taken at the same time every day. Do not skip a dose.  Talk with your health care provider before you take any medicines that contain aspirin or NSAIDs. These medicines increase your risk for dangerous bleeding.  Ask your health care provider about foods and drugs that could change the way the medicine works (may interact). Avoid those things if your health  care provider tells you to do so.  Blood thinners can cause easy bruising and may make it difficult to stop bleeding. Because of this: ? Be very careful when using knives, scissors, or other sharp objects. ? Use an electric razor instead of a blade. ? Avoid activities that could cause injury or bruising, and follow instructions about how to prevent falls.  Wear a medical alert bracelet or carry a card that lists what medicines you take. General instructions  Take over-the-counter and prescription medicines only as told by your health care provider.  Return to your normal activities as told by your health care provider. Ask your health care provider what activities are safe for you.  Wear compression stockings if recommended by your health care provider.  Keep all follow-up visits as told by your health care provider. This is important. How is this prevented? To lower your risk of developing this condition again:  For 30 or more minutes every day, do an activity that: ? Involves moving your arms and legs. ? Increases your heart rate.  When traveling for longer than four hours: ? Exercise your arms and legs every hour. ? Drink plenty of water. ? Avoid drinking alcohol.  Avoid sitting or lying for a long time without moving your legs.  If you have surgery or you are hospitalized, ask about ways to prevent blood clots. These may include taking frequent walks or using anticoagulants.  Stay at a healthy weight.  If you are a woman who is older than age 19, avoid unnecessary use of medicines that contain estrogen, such as some birth control pills.  Do not use any products that contain nicotine or tobacco, such as cigarettes and e-cigarettes. This is especially important if you take estrogen medicines. If you need help quitting, ask your health care provider. Contact a health care provider if:  You miss a dose of your blood thinner.  Your menstrual period is heavier than  usual.  You have unusual bruising. Get help right away if:  You have: ? New or increased pain, swelling, or redness in an arm or leg. ? Numbness or tingling in an arm or leg. ? Shortness of breath. ? Chest pain. ? A rapid or irregular heartbeat. ? A severe headache or confusion. ? A cut that will not stop  bleeding.  There is blood in your vomit, stool, or urine.  You have a serious fall or accident, or you hit your head.  You feel light-headed or dizzy.  You cough up blood. These symptoms may represent a serious problem that is an emergency. Do not wait to see if the symptoms will go away. Get medical help right away. Call your local emergency services (911 in the [place] Do not drive yourself to the hospital. Summary  Deep vein thrombosis (DVT) is a condition in which a blood clot forms in a deep vein, such as a lower leg, thigh, or arm vein.  Symptoms can include swelling, warmth, pain, and redness in your leg or arm.  This condition may be treated with a blood thinner (anticoagulant medicine), medicine that is injected to dissolve blood clots,compression stockings, or surgery.  If you are prescribed blood thinners, take them exactly as told. This information is not intended to replace advice given to you by your health care provider. Make sure you discuss any questions you have with your health care provider. Document Released: 07/19/2005 Document Revised: 07/01/2017 Document Reviewed: 12/17/2016 Elsevier Patient Education  [place].  Pulmonary Embolism  A pulmonary embolism (PE) is a sudden blockage or decrease of blood flow in one or both lungs. Most blockages come from a blood clot that forms in the vein of a lower leg, thigh, or arm (deep vein thrombosis, DVT) and travels to the lungs. A clot is blood that has thickened into a gel or solid. PE is a dangerous and life-threatening condition that needs to be treated right away. What are the causes? This condition  is usually caused by a blood clot that forms in a vein and moves to the lungs. In rare cases, it may be caused by air, fat, part of a tumor, or other tissue that moves through the veins and into the lungs. What increases the risk? The following factors may make you more likely to develop this condition:  Experiencing a traumatic injury, such as breaking a hip or leg.  Having: ? A spinal cord injury. ? Orthopedic surgery, especially hip or knee replacement. ? Any major surgery. ? A stroke. ? DVT. ? Blood clots or blood clotting disease. ? Long-term (chronic) lung or heart disease. ? Cancer treated with chemotherapy. ? A central venous catheter.  Taking medicines that contain estrogen. These include birth control pills and hormone replacement therapy.  Being: ? Pregnant. ? In the period of time after your baby is delivered (postpartum). ? Older than age 91. ? Overweight. ? A smoker, especially if you have other risks. What are the signs or symptoms? Symptoms of this condition usually start suddenly and include:  Shortness of breath during activity or at rest.  Coughing, coughing up blood, or coughing up blood-tinged mucus.  Chest pain that is often worse with deep breaths.  Rapid or irregular heartbeat.  Feeling light-headed or dizzy.  Fainting.  Feeling anxious.  Fever.  Sweating.  Pain and swelling in a leg. This is a symptom of DVT, which can lead to PE. How is this diagnosed? This condition may be diagnosed based on:  Your medical history.  A physical exam.  Blood tests.  CT pulmonary angiogram. This test checks blood flow in and around your lungs.  Ventilation-perfusion scan, also called a lung VQ scan. This test measures air flow and blood flow to the lungs.  An ultrasound of the legs. How is this treated? Treatment for this condition  depends on many factors, such as the cause of your PE, your risk for bleeding or developing more clots, and other  medical conditions you have. Treatment aims to remove, dissolve, or stop blood clots from forming or growing larger. Treatment may include:  Medicines, such as: ? Blood thinning medicines (anticoagulants) to stop clots from forming. ? Medicines that dissolve clots (thrombolytics).  Procedures, such as: ? Using a flexible tube to remove a blood clot (embolectomy) or to deliver medicine to destroy it (catheter-directed thrombolysis). ? Inserting a filter into a large vein that carries blood to the heart (inferior vena cava). This filter (vena cava filter) catches blood clots before they reach the lungs. ? Surgery to remove the clot (surgical embolectomy). This is rare. You may need a combination of immediate, long-term (up to 3 months after diagnosis), and extended (more than 3 months after diagnosis) treatments. Your treatment may continue for several months (maintenance therapy). You and your health care provider will work together to choose the treatment program that is best for you. Follow these instructions at home: Medicines  Take over-the-counter and prescription medicines only as told by your health care provider.  If you are taking an anticoagulant medicine: ? Take the medicine every day at the same time each day. ? Understand what foods and drugs interact with your medicine. ? Understand the side effects of this medicine, including excessive bruising or bleeding. Ask your health care provider or pharmacist about other side effects. General instructions  Wear a medical alert bracelet or carry a medical alert card that says you have had a PE and lists what medicines you take.  Ask your health care provider when you may return to your normal activities. Avoid sitting or lying for a long time without moving.  Maintain a healthy weight. Ask your health care provider what weight is healthy for you.  Do not use any products that contain nicotine or tobacco, such as cigarettes,  e-cigarettes, and chewing tobacco. If you need help quitting, ask your health care provider.  Talk with your health care provider about any travel plans. It is important to make sure that you are still able to take your medicine while on trips.  Keep all follow-up visits as told by your health care provider. This is important. Contact a health care provider if:  You missed a dose of your blood thinner medicine. Get help right away if:  You have: ? New or increased pain, swelling, warmth, or redness in an arm or leg. ? Numbness or tingling in an arm or leg. ? Shortness of breath during activity or at rest. ? A fever. ? Chest pain. ? A rapid or irregular heartbeat. ? A severe headache. ? Vision changes. ? A serious fall or accident, or you hit your head. ? Stomach (abdominal) pain. ? Blood in your vomit, stool, or urine. ? A cut that will not stop bleeding.  You cough up blood.  You feel light-headed or dizzy.  You cannot move your arms or legs.  You are confused or have memory loss. These symptoms may represent a serious problem that is an emergency. Do not wait to see if the symptoms will go away. Get medical help right away. Call your local emergency services (911 in the [place] Do not drive yourself to the hospital. Summary  A pulmonary embolism (PE) is a sudden blockage or decrease of blood flow in one or both lungs. PE is a dangerous and life-threatening condition that needs to be  treated right away.  Treatments for this condition usually include medicines to thin your blood (anticoagulants) or medicines to break apart blood clots (thrombolytics).  If you are given blood thinners, it is important to take the medicine every day at the same time each day.  Understand what foods and drugs interact with any medicines that you are taking.  If you have signs of PE or DVT, call your local emergency services (911 in the [place] This information is not intended to replace advice  given to you by your health care provider. Make sure you discuss any questions you have with your health care provider. Document Released: 07/16/2000 Document Revised: 04/26/2018 Document Reviewed: 04/26/2018 Elsevier Patient Education  2020 [place] on my medicine - ELIQUIS (apixaban)  This medication education was reviewed with me or my healthcare representative as part of my discharge preparation.  Why was Eliquis prescribed for you? Eliquis was prescribed to treat blood clots that may have been found in the veins of your legs (deep vein thrombosis) or in your lungs (pulmonary embolism) and to reduce the risk of them occurring again.  What do You need to know about Eliquis ? The starting dose is 10 mg (two 5 mg tablets) taken TWICE daily for the FIRST SEVEN (7) DAYS, then on (enter date)  06/11/2019  the dose is reduced to ONE 5 mg tablet taken TWICE daily.  Eliquis may be taken with or without food.   Try to take the dose about the same time in the morning and in the evening. If you have difficulty swallowing the tablet whole please discuss with your pharmacist how to take the medication safely.  Take Eliquis exactly as prescribed and DO NOT stop taking Eliquis without talking to the doctor who prescribed the medication.  Stopping may increase your risk of developing a new blood clot.  Refill your prescription before you run out.  After discharge, you should have regular check-up appointments with your healthcare provider that is prescribing your Eliquis.    What do you do if you miss a dose? If a dose of ELIQUIS is not taken at the scheduled time, take it as soon as possible on the same day and twice-daily administration should be resumed. The dose should not be doubled to make up for a missed dose.  Important Safety Information A possible side effect of Eliquis is bleeding. You should call your healthcare provider right away if you experience any of the  following: ? Bleeding from an injury or your nose that does not stop. ? Unusual colored urine (red or dark brown) or unusual colored stools (red or black). ? Unusual bruising for unknown reasons. ? A serious fall or if you hit your head (even if there is no bleeding).  Some medicines may interact with Eliquis and might increase your risk of bleeding or clotting while on Eliquis. To help avoid this, consult your healthcare provider or pharmacist prior to using any new prescription or non-prescription medications, including herbals, vitamins, non-steroidal anti-inflammatory drugs (NSAIDs) and supplements.  This website has more information on Eliquis (apixaban): http://www.eliquis.com/eliquis/home

## 2019-06-04 NOTE — Care Management Important Message (Signed)
Important Message  Patient Details  Name: [name] MRN: [id number] Date of Birth: April 25, 1922   Medicare Important Message Given:  Yes     Wilene Pharo [address] 06/04/2019, 3:36 PM

## 2019-06-04 NOTE — Progress Notes (Signed)
Physical Therapy Treatment Patient Details Name: [name] MRN: [id number] DOB: [birth year] Today's Date: 06/04/2019    History of Present Illness [name] is a 85 y.o. male with medical history significant for coronary artery disease, history of CVA, insulin-dependent diabetes mellitus, hypertension, chronic kidney disease stage III and depression with behavioral disturbance, now presenting to the emergency department for evaluation of somnolence and R sided weakness. CT and MRI of head negative for acute infarct., but whowed remote anterior Lt frontal lobe infarct.  CTA showed bil. Lower lobe PEs.      PT Comments    Pt more alert during session today than he was on Saturday. RUE, especially hand, more tender today and with noted swelling. Pt continues to require mod A for bed mobility and mod A +2 for transfers and is not stepping effectively enough to progress ambulation. Pt continues to be appropriate for SNF but his daughter is hoping to take him home and she has been present and participatory in each session. Gave her a gait belt and educated her on safe guarding technique. If he goes home, they would benefit from [facility] to assist with bathing. He would also need a w/c and ambulance transport home. Daughter continuing to think through options. PT will continue to follow.    Follow Up Recommendations  SNF;Supervision/Assistance - 24 hour     Equipment Recommendations  Wheelchair (measurements PT)    Recommendations for Other Services       Precautions / Restrictions Precautions Precautions: Fall Precaution Comments: pt fell 2x at home before coming in Restrictions Weight Bearing Restrictions: No    Mobility  Bed Mobility Overal bed mobility: Needs Assistance Bed Mobility: Supine to Sit     Supine to sit: Mod assist     General bed mobility comments: mod A to initiate bed mobility and achieve upright sitting, pt assisted with trunk control  Transfers Overall  transfer level: Needs assistance Equipment used: 2 person hand held assist Transfers: Sit to/from [facility] Sit to Stand: Mod assist;+2 safety/equipment Stand pivot transfers: Mod assist;+2 physical assistance       General transfer comment: mod A on L for power up, +2 on R for safety. vc's to step feet towards chair but needed facilitation at hips as well for wt shifting  Ambulation/Gait             General Gait Details: pt not stepping feet effectively enough to walk at this point   Stairs             Wheelchair Mobility    Modified Rankin (Stroke Patients Only) Modified Rankin (Stroke Patients Only) Pre-Morbid Rankin Score: Moderate disability Modified Rankin: Moderately severe disability     Balance Overall balance assessment: Needs assistance;History of Falls Sitting-balance support: Feet supported;No upper extremity supported Sitting balance-Leahy Scale: Fair Sitting balance - Comments: Pt able to maintain EOB sitting with close min guard assist  Postural control: Left lateral lean;Posterior lean Standing balance support: Bilateral upper extremity supported Standing balance-Leahy Scale: Poor Standing balance comment: Pt requires mod - max A to maintain standing                             Cognition Arousal/Alertness: Awake/alert Behavior During Therapy: Flat affect Overall Cognitive Status: Impaired/Different from baseline Area of Impairment: Problem solving;Following commands;Attention;Awareness;Safety/judgement;Memory                   Current Attention Level: Sustained  Memory: Decreased short-term memory Following Commands: Follows one step commands inconsistently;Follows one step commands with increased time Safety/Judgement: Decreased awareness of deficits Awareness: Intellectual Problem Solving: Slow processing;Decreased initiation;Difficulty sequencing;Requires verbal cues;Requires tactile cues General  Comments: pt more interactive today and speaking occasionally, daughter reports that he is nearing baseline cognition      Exercises      General Comments General comments (skin integrity, edema, etc.): discussed safety at d/c as well as how to guard pt. Gave pt's daughter a gait belt for home use. They would benefit from [facility] to assist wit bathing and dressing. He will also need ambulance transport home      Pertinent Vitals/Pain Pain Assessment: Faces Faces Pain Scale: Hurts whole lot Pain Location: right arm/ hand Pain Descriptors / Indicators: Grimacing;Guarding Pain Intervention(s): Monitored during session;Repositioned    Home Living                      Prior Function            PT Goals (current goals can now be found in the care plan section) Acute Rehab PT Goals Patient Stated Goal: daughter continues to desire to take pt home PT Goal Formulation: With family Time For Goal Achievement: 06/16/19 Potential to Achieve Goals: Fair Progress towards PT goals: Progressing toward goals    Frequency    Min 3X/week      PT Plan Current plan remains appropriate;Equipment recommendations need to be updated    Co-evaluation              AM-PAC PT "6 Clicks" Mobility   Outcome Measure  Help needed turning from your back to your side while in a flat bed without using bedrails?: A Lot Help needed moving from lying on your back to sitting on the side of a flat bed without using bedrails?: A Lot Help needed moving to and from a bed to a chair (including a wheelchair)?: A Lot Help needed standing up from a chair using your arms (e.g., wheelchair or bedside chair)?: A Lot Help needed to walk in hospital room?: Total Help needed climbing 3-5 steps with a railing? : Total 6 Click Score: 10    End of Session Equipment Utilized During Treatment: Gait belt Activity Tolerance: Patient tolerated treatment well Patient left: with call bell/phone within  reach;with family/visitor present;in chair;with chair alarm set Nurse Communication: Mobility status PT Visit Diagnosis: Unsteadiness on feet (R26.81);Muscle weakness (generalized) (M62.81);History of falling (Z91.81);Difficulty in walking, not elsewhere classified (R26.2);Apraxia (R48.2);Hemiplegia and hemiparesis Hemiplegia - Right/Left: Right Hemiplegia - dominant/non-dominant: Dominant Hemiplegia - caused by: Unspecified     Time: 1200-1225 PT Time Calculation (min) (ACUTE ONLY): 25 min  Charges:  $Therapeutic Activity: 8-22 mins $Self Care/Home Management: 8-22                     [place]  Pager [phone] Office [place] 06/04/2019, 1:11 PM

## 2019-06-04 NOTE — Progress Notes (Signed)
Chart review note  Signed out by Dr. Leonel Ramsay to follow-up on imaging.  MRI of the brain-repeated-reviewed-no acute findings.  Continues to show findings of an aging brain as well as a remote left frontal infarct with no evidence of any acute infarct explain current clinical picture.  MRI of the C-spine with mild prevertebral interspinous fluid signal suggesting strain but no occult fracture or visible cord contusion.  Diffuse degenerative disease with variable foraminal impingement from C3-4 to C7-T1.  Other findings dictated in detail by Dr. Pascal Lux from neuroradiology and the MRI C-spine that show multilevel degenerative disc disease and cord flattening at multiple levels.  Recommendations: -Consider neurosurgical consultation for the multilevel cervical spine disease.  Unclear if he will be a surgical candidate given advanced age, but that can be a quick question for neurosurgery. -Continue PT OT  Please call neurology with questions  -- Amie Portland, MD [facility] Pager: [phone] If 7pm to 7am, please call on call as listed on AMION.

## 2019-06-04 NOTE — Progress Notes (Signed)
Called the patient's daughter and gave updates via phone. All questions answered to her satisfaction. She is aware of plan to dc home with home health services and DME tomorrow.

## 2019-06-04 NOTE — Progress Notes (Addendum)
ANTICOAGULATION CONSULT NOTE  Pharmacy Consult:  Heparin Indication: pulmonary embolus  Allergies  Allergen Reactions  . Lisinopril Cough    Patient Measurements: Height: 5\' 11"  (180.3 cm) Weight: 174 lb 2.6 oz (79 kg) IBW/kg (Calculated) : 75.3 Heparin Dosing Weight: 79.1 kg  Vital Signs: Temp: 98.2 F (36.8 C) (11/02 0603) Temp Source: Oral (11/02 0026) BP: 122/63 (11/02 0603) Pulse Rate: 79 (11/02 0603)  Labs: Recent Labs    06/01/19 2111 06/01/19 2202 06/02/19 0030 06/02/19 0250  06/02/19 1943 06/03/19 0420 06/04/19 0541  HGB 10.9*  --   --  11.6*  --   --  10.3* 10.3*  HCT 32.0*  --   --  36.4*  --   --  31.3* 31.4*  PLT  --   --   --  174  --   --  188 196  HEPARINUNFRC  --   --   --   --    < > 0.44 0.36 0.25*  CREATININE 2.00*  --   --  1.79*  --   --   --  1.65*  CKTOTAL  --  3,868*  --   --   --   --  1,095* 693*  TROPONINIHS  --   --  51* 59*  --   --  33*  --    < > = values in this interval not displayed.    Estimated Creatinine Clearance: 28.5 mL/min (A) (by C-G formula based on SCr of 1.65 mg/dL (H)).  Assessment: 71 YOM presented with AMS and code stroke was called initially, then cancelled, now found to have acute PE and Pharmacy consulted to dose IV heparin. Not on anticoagulation PTA.  Heparin level is subtherapeutic at 0.25 this AM. CBC stable. No bleeding or issues with infusion per discussion with RN.  Goal of Therapy:  Heparin level 0.3-0.7 units/ml Monitor platelets by anticoagulation protocol: Yes   Plan:  Increase heparin to 1250 units/hr 6h heparin level Monitor daily heparin level and CBC, s/sx bleeding F/u long-term plan for anticoagulation as appropriate   Elicia Lamp, PharmD, BCPS Clinical Pharmacist Please check AMION for all [place] contact numbers 06/04/2019 10:40 AM

## 2019-06-04 NOTE — Progress Notes (Addendum)
PROGRESS NOTE  [name] [id number] DOB: 10/12/1921 DOA: 06/01/2019 PCP: Gayland Curry, DO  HPI/Recap of past 24 hours: [name] is a 85 y.o. male with medical history significant for coronary artery disease, history of CVA, insulin-dependent diabetes mellitus, hypertension, chronic kidney disease stage III and depression with behavioral disturbance, now presenting to the emergency department for evaluation of somnolence and weakness.  Patient was started on Haldol a few days ago and has reportedly been very somnolent since then with multiple falls.  Patient's daughter was concerned that his right arm and leg were weaker than the left at approximately 1 PM today.  Daughter was able to arrange a telemedicine visit with the patient's PCP who recommended further evaluation in the emergency department.  Patient is unable to contribute to the history due to his clinical condition.  ED Course: Upon arrival to the ED, patient is found to be afebrile, saturating mid 90s on room air, and slightly tachycardic.  EKG features sinus tachycardia with rate 104, RBBB, and nonspecific ST-T abnormalities.  Noncontrast head CT is negative for acute findings.  CTA head and neck is concerning for bilateral lower lobe PE.  Chemistry panel features a creatinine of 2.08, up from 1.49 four days earlier.  CBC features a mild normocytic anemia and clumped platelets.  Neurology was consulted by the ED physician and the patient was started on IV heparin infusion.   B/L lower extremity Doppler ultrasound positive for DVT.  MRI brain no acute CVA, MRI cervical spine revealed tenderness cervical spine disease.  Neurosurgery contacted on 06/04/2019.  06/04/19: Patient was seen and examined at his bedside.  No significant changes from prior exam.  He is alert in the setting of dementia.  He has no new complaints.  He is on heparin drip until no planned procedures then will switch to  Eliquis.  Assessment/Plan: Principal Problem:   Pulmonary embolism (HCC) Active Problems:   Type 2 diabetes, controlled, with peripheral circulatory disorder (HCC)   Essential hypertension   Coronary artery disease involving native coronary artery of native heart with angina pectoris (HCC)   Moderate dementia with behavioral disturbance (HCC)   History of stroke   Centrilobular emphysema (HCC)   Acute renal failure superimposed on stage 3 chronic kidney disease (HCC)   Chronic diastolic CHF (congestive heart failure) ([place])   Resolving acute metabolic encephalopathy; R sided weakness  His mentation appears improved and more interactive today. - Presented with several days of somnolence since he was started on Haldol and then right arm and leg weakness noted by his daughter several hours prior to arrival  - Head CT with no acute findings, CTA head and neck notable for lower lobe PE's bilaterally   MRI brain no acute findings Repeat MRI brain on 06/04/2019 no sign of acute CVA. MRI neck showed multilevel cervical spine disease. - Neurology is consulting and much appreciated   Evaluated by speech therapist w recs for dysphagia 2 diet. UA, Ammonia, B12, TSH unrevealing CK >3000>> 693 on 06/04/2019. RPR non reactive  History of CVA On ASA 81 mg daily Simvastatin on hold due to elevated CPK Defer to neurology to restart statin Last LDL 74 on 06/02/19, goal < 86   Newly diagnosed Pulmonary embolism and bilateral lower extremity DVT, incidentally found on CTA head and neck -Positive bilateral Doppler ultrasound for DVT on 06/03/2019. - CTA head and neck incidentally notable for bilateral pulmonary embolism  -He is on heparin drip until no planned procedures then will  switch to Eliquis. -O2 saturation 100% on room air  Diffuse degenerative disease involving C3-4 to C7-T1 with variable foraminal impingement with degenerative ventral cord flattening at C3-4 and C5-6. Neurosurgery  consulted on 06/04/2019 to further assess. Appreciate recommendations.  Multiple pleural-based nodules throughout the right lung, relatively stable from prior CT in June 2020 However these have progressed since CT in 2018 No pathologically enlarged lymph nodes. Follow-up outpatient  Right shoulder pain post fall R shoulder xray done on 06/03/2019 no acute findings analgesic as needed  Improving acute kidney injury superimposed on CKD III  Baseline cr appears to be  1.4 with GFR 49 - presented with SCr is 2.08 on admission, up from 1.49 four days earlier  -Improved on IV fluid hydration, creatinine 1.65 with GFR of 40 on 06/04/2019.  1.1 L.  Urine output recorded in the last 24 hours. Continue to avoid nephrotoxins like NSAIDs Will need to follow-up with PCP  Chronic diastolic CHF  - Appears compensated  BNP wjthin normal range Continue strict I&O and daily weight  Insulin-dependent DM2 due to CKD  - A1c was 6.3% in May 2020  - Continue insulin with dose-reduction in light of increased creatinine -Will need to follow-up with PCP  Hypertension  - Blood pressure is at goal -Continue to hold home antihypertensive to avoid hypotension  Dementia with behavioral disturbances  Resume Namenda and Aricept   Resolving elevated CPK Presented with CPK >3800 CPK is trending down on IV fluid>> 623 on 06/04/2019. Statin on hold due to elevated CPK Follow up with neurology Defer to neurology to restart statin  Elevated troponin likely demand ischemia in the context of newly diagnosed PE and AKI. Troponin peaked at 59 and trended down Continues to deny anginal symptoms  Physical debility/ambulatory dysfunction PT OT assessment recommended SNF.  Declined by family.  They would like to bring him home. Continue fall precautions     PPE: Mask, face shield  DVT prophylaxis: IV heparin infusion  Code Status: Full code, confirmed by daughter  Family Communication:  Daughter updated  at bedside on 06/03/2019 Consults called: Neurology , neurosurgery 06/04/19  Disposition: Possible discharge to home tomorrow 06/05/2019 with home health services if neurosurgery signs off and after patient is switched from heparin drip to [place].  Objective: Vitals:   06/03/19 2032 06/04/19 0026 06/04/19 0145 06/04/19 0603  BP: (!) 114/54 115/60  122/63  Pulse: 81 77  79  Resp: 20 18  18   Temp: 97.9 F (36.6 C) 98.1 F (36.7 C)  98.2 F (36.8 C)  TempSrc: Oral Oral    SpO2: 95% 96%  100%  Weight:   79 kg   Height:        Intake/Output Summary (Last 24 hours) at 06/04/2019 1231 Last data filed at 06/04/2019 0145 Gross per 24 hour  Intake 1599.09 ml  Output 1100 ml  Net 499.09 ml   Filed Weights   06/02/19 0317 06/02/19 0324 06/04/19 0145  Weight: 79.1 kg 79.1 kg 79 kg    Exam:   General: 85 y.o. year-old male well-developed well-nourished in no acute distress.  Alert in the setting of dementia.    Cardiovascular: Regular rate and rhythm no rubs or gallops.  No JVD.  Respiratory: Clear to auscultation no wheezes or rales.  Abdomen: Soft nontender nondistended normal bowel sounds present.    Musculoskeletal: Trace lower extremity edema.  2 out of 4 pulses in all 4 extremities.    Psychiatry: Mood is appropriate for condition and  setting.   Data Reviewed: CBC: Recent Labs  Lab 06/01/19 1921 06/01/19 2111 06/02/19 0250 06/03/19 0420 06/04/19 0541  WBC 8.1  --  8.4 7.0 5.7  NEUTROABS 5.0  --   --   --   --   HGB 11.7* 10.9* 11.6* 10.3* 10.3*  HCT 35.5* 32.0* 36.4* 31.3* 31.4*  MCV 94.2  --  95.0 92.9 94.6  PLT PLATELET CLUMPS NOTED ON SMEAR, UNABLE TO ESTIMATE  --  174 188 [id number]   Basic Metabolic Panel: Recent Labs  Lab 06/01/19 1921 06/01/19 2111 06/02/19 0250 06/04/19 0541  NA 141 141 142 142  K 4.8 4.0 4.2 4.3  CL 100 99 102 103  CO2 29  --  25 30  GLUCOSE 112* 97 94 119*  BUN 29* 29* 29* 29*  CREATININE 2.08* 2.00* 1.79* 1.65*  CALCIUM 9.6  --  9.4  8.7*   GFR: Estimated Creatinine Clearance: 28.5 mL/min (A) (by C-G formula based on SCr of 1.65 mg/dL (H)). Liver Function Tests: Recent Labs  Lab 06/01/19 1921  AST 166*  ALT 67*  ALKPHOS 50  BILITOT 1.7*  PROT 6.6  ALBUMIN 3.6   No results for input(s): LIPASE, AMYLASE in the last 168 hours. Recent Labs  Lab 06/01/19 2202  AMMONIA 29   Coagulation Profile: No results for input(s): INR, PROTIME in the last 168 hours. Cardiac Enzymes: Recent Labs  Lab 06/01/19 2202 06/03/19 0420 06/04/19 0541  CKTOTAL 3,868* 1,095* 693*   BNP (last 3 results) No results for input(s): PROBNP in the last 8760 hours. HbA1C: Recent Labs    06/02/19 0250  HGBA1C 6.3*   CBG: Recent Labs  Lab 06/03/19 0854 06/03/19 1058 06/03/19 1641 06/03/19 2036 06/04/19 0602  GLUCAP 163* 141* 154* 203* 105*   Lipid Profile: Recent Labs    06/02/19 0250  CHOL 134  HDL 48  LDLCALC 74  TRIG 61  CHOLHDL 2.8   Thyroid Function Tests: Recent Labs    06/01/19 2202  TSH 1.527   Anemia Panel: Recent Labs    06/01/19 2202  VITAMINB12 684   Urine analysis:    Component Value Date/Time   COLORURINE YELLOW 05/28/2019 0640   APPEARANCEUR CLEAR 05/28/2019 0640   LABSPEC 1.012 05/28/2019 0640   PHURINE 5.0 05/28/2019 0640   GLUCOSEU NEGATIVE 05/28/2019 0640   HGBUR NEGATIVE 05/28/2019 0640   BILIRUBINUR NEGATIVE 05/28/2019 0640   KETONESUR NEGATIVE 05/28/2019 0640   PROTEINUR NEGATIVE 05/28/2019 0640   UROBILINOGEN 0.2 04/17/2014 1756   NITRITE NEGATIVE 05/28/2019 0640   LEUKOCYTESUR NEGATIVE 05/28/2019 0640   Sepsis Labs: @LABRCNTIP (procalcitonin:4,lacticidven:4)  ) Recent Results (from the past 240 hour(s))  SARS CORONAVIRUS 2 (TAT 6-24 HRS) Nasopharyngeal Nasopharyngeal Swab     Status: None   Collection Time: 06/01/19  9:56 PM   Specimen: Nasopharyngeal Swab  Result Value Ref Range Status   SARS Coronavirus 2 NEGATIVE NEGATIVE Final    Comment: (NOTE) SARS-CoV-2  target nucleic acids are NOT DETECTED. The SARS-CoV-2 RNA is generally detectable in upper and lower respiratory specimens during the acute phase of infection. Negative results do not preclude SARS-CoV-2 infection, do not rule out co-infections with other pathogens, and should not be used as the sole basis for treatment or other patient management decisions. Negative results must be combined with clinical observations, patient history, and epidemiological information. The expected result is Negative. Fact Sheet for Patients: SugarRoll.be Fact Sheet for Healthcare Providers: https://www.woods-mathews.com/ This test is not yet approved or cleared by the [place]  States FDA and  has been authorized for detection and/or diagnosis of SARS-CoV-2 by FDA under an Emergency Use Authorization (EUA). This EUA will remain  in effect (meaning this test can be used) for the duration of the COVID-19 declaration under Section 56 4(b)(1) of the Act, 21 U.S.C. section 360bbb-3(b)(1), unless the authorization is terminated or revoked sooner. Performed at [facility], [place] [address], [place], [place] [address]       Studies: Ct Chest Wo Contrast  Result Date: 06/04/2019 CLINICAL DATA:  Adenopathy EXAM: CT CHEST WITHOUT CONTRAST TECHNIQUE: Multidetector CT imaging of the chest was performed following the standard protocol without IV contrast. COMPARISON:  January 18, 2019 CT of the chest FINDINGS: Cardiovascular: The heart size is normal. Coronary artery calcifications are noted. There are atherosclerotic calcifications of the thoracic aorta. There is no significant pericardial effusion. Mediastinum/Nodes: --No mediastinal or hilar lymphadenopathy. --No axillary lymphadenopathy. --No supraclavicular lymphadenopathy. --Normal thyroid gland. --The esophagus is unremarkable Lungs/Pleura: Again identified are scattered bilateral pleural based pulmonary nodules. The  overall distribution in size of these nodules is similar to prior CT in June of 2020. There is a small right-sided pleural effusion, significantly decreased in size from prior study in June. There is some mucous plugging and bronchial wall thickening at the lung bases, right worse than left Upper Abdomen: No acute abnormality. Musculoskeletal: No chest wall abnormality. No acute or significant osseous findings. There is asymmetric gynecomastia involving the right chest wall. Review of the MIP images confirms the above findings. IMPRESSION: 1. No pathologically enlarged lymph nodes. 2. Again noted are multiple pleural based nodules throughout the right lung, relatively stable from prior CT in June of 2020. However, these have progressed since CT in 2018. Differential considerations include noncalcified pleural based plaques, pleural metastases, or primary pleural neoplasm such as mesothelioma. 3. Small right-sided pleural effusion, significantly improved from prior study. Aortic Atherosclerosis (ICD10-I70.0). Electronically Signed   By: Constance Holster M.D.   On: 06/04/2019 05:27   Mr Brain Wo Contrast  Result Date: 06/04/2019 CLINICAL DATA:  New onset right-sided weakness in the setting of pulmonary embolism EXAM: MRI HEAD WITHOUT CONTRAST TECHNIQUE: Multiplanar, multiecho pulse sequences of the brain and surrounding structures were obtained without intravenous contrast. COMPARISON:  Two days ago FINDINGS: Brain: No acute infarction, hemorrhage, hydrocephalus, extra-axial collection or mass lesion. Moderate area of encephalomalacia in the anterior left frontal lobe, best attributed to chronic infarct. Minimal small vessel ischemic change in the white matter and pons for age. Cerebral volume loss without specific pattern, in keeping with age. Vascular: Normal flow voids Skull and upper cervical spine: Mild right-sided scalp swelling. Normal marrow signal Sinuses/Orbits: Opacified right anterior ethmoid air  cells with some volume loss. Bilateral cataract resection IMPRESSION: 1. No acute finding including infarct. 2. Aging brain with remote left frontal infarct. Electronically Signed   By: Monte Fantasia M.D.   On: 06/04/2019 05:19   Mr Cervical Spine Wo Contrast  Result Date: 06/04/2019 CLINICAL DATA:  Acute or progressive myelopathy EXAM: MRI CERVICAL SPINE WITHOUT CONTRAST TECHNIQUE: Multiplanar, multisequence MR imaging of the cervical spine was performed. No intravenous contrast was administered. COMPARISON:  None. FINDINGS: Alignment: Straightening of the cervical spine. No traumatic malalignment Vertebrae: No occult fracture, discitis, or aggressive bone lesion. Cord: Degenerative impingement at C3-4 and C5-6, but no detected cord signal abnormality. Motion is a limiting factor Posterior Fossa, vertebral arteries, paraspinal tissues: Thin layer ofprevertebral edema. Interspinous STIR hyperintensity at C6-7 and to a lesser extent  at C5-6, without major ligamentous disruption or fracture on preceding CTA. Disc levels: C2-3: Mild asymmetric left facet spurring. C3-4: Advanced disc narrowing with downward pointing disc protrusion flattening the ventral cord. Biforaminal impingement, greater on the left, due to disc height loss and uncovertebral ridging with bulging. C4-5: Advanced disc narrowing with endplate and uncovertebral ridging and disc bulging. Spinal stenosis with disc osteophyte complex contacting the ventral cord. Biforaminal impingement greater on the left C5-6: Disc narrowing and bulging with superimposed central and right foraminal protrusion. There is spinal stenosis with ventral cord flattening. Advanced right foraminal impingement C6-7: Disc narrowing and bulging with a left paracentral protrusion and buttressing osteophytes. Uncovertebral spurring on both sides. Spinal stenosis with disc osteophyte complex contacting the ventral cord. The left foraminal protrusion deforms the C7 nerve root on  sagittal images. There is also right foraminal impingement C7-T1:Disc narrowing and bulging with a left foraminal protrusion causing advanced foraminal impingement. IMPRESSION: 1. Mild prevertebral and interspinous fluid signal suggesting strain. No occult fracture or visible cord contusion. 2. Diffuse degenerative disease with variable foraminal impingement from C3-4 to C7-T1, as above. 3. Degenerative ventral cord flattening at C3-4 and C5-6. Electronically Signed   By: Monte Fantasia M.D.   On: 06/04/2019 05:28   Dg Shoulder Right Port  Result Date: 06/03/2019 CLINICAL DATA:  RIGHT shoulder pain, no known injury EXAM: PORTABLE RIGHT SHOULDER COMPARISON:  None FINDINGS: Osseous demineralization. Degenerative changes at [facility] joint with inferior acromial and clavicular spur formation. No acute fracture, dislocation, or bone destruction. Minimal chondrocalcinosis at shoulder joint. Visualized ribs intact. IMPRESSION: AC joint degenerative changes with inferior acromial and clavicular spur formation which may predispose the patient to rotator cuff pathology. Chondrocalcinosis at shoulder joint question CPPD. No acute abnormalities. Electronically Signed   By: Lavonia Dana M.D.   On: 06/03/2019 18:10    Scheduled Meds:  aspirin EC  81 mg Oral Daily   escitalopram  5 mg Oral Daily   insulin aspart  0-5 Units Subcutaneous QHS   insulin aspart  0-9 Units Subcutaneous TID WC   sodium chloride flush  3 mL Intravenous Q12H   tamsulosin  0.4 mg Oral QPC supper    Continuous Infusions:  sodium chloride 75 mL/hr at 06/03/19 1800   heparin 1,250 Units/hr (06/04/19 1207)     LOS: 3 days     Kayleen Memos, MD [facility] Pager [phone]  If 7PM-7AM, please contact night-coverage [email or web address] Password [facility] 06/04/2019, 12:31 PM

## 2019-06-04 NOTE — Telephone Encounter (Signed)
Called patient to start [place] with patient's daughter Hossein Etris who states the patient is in the hospital after having a stroke between last Thursday and Friday. Dr. Mariea Clonts notified.

## 2019-06-04 NOTE — Progress Notes (Signed)
ANTICOAGULATION CONSULT NOTE - Initial Consult  Pharmacy Consult for Apixaban Indication: pulmonary embolus and DVT  Allergies  Allergen Reactions  . Lisinopril Cough    Patient Measurements: Height: 5\' 11"  (180.3 cm) Weight: 174 lb 2.6 oz (79 kg) IBW/kg (Calculated) : 75.3  Vital Signs: Temp: 97.5 F (36.4 C) (11/02 1400) Temp Source: Oral (11/02 1400) BP: 129/53 (11/02 1400) Pulse Rate: 100 (11/02 1400)  Labs: Recent Labs    06/01/19 2111 06/01/19 2202 06/02/19 0030 06/02/19 0250  06/02/19 1943 06/03/19 0420 06/04/19 0541  HGB 10.9*  --   --  11.6*  --   --  10.3* 10.3*  HCT 32.0*  --   --  36.4*  --   --  31.3* 31.4*  PLT  --   --   --  174  --   --  188 196  HEPARINUNFRC  --   --   --   --    < > 0.44 0.36 0.25*  CREATININE 2.00*  --   --  1.79*  --   --   --  1.65*  CKTOTAL  --  3,868*  --   --   --   --  1,095* 693*  TROPONINIHS  --   --  51* 59*  --   --  33*  --    < > = values in this interval not displayed.    Estimated Creatinine Clearance: 28.5 mL/min (A) (by C-G formula based on SCr of 1.65 mg/dL (H)).   Medical History: Past Medical History:  Diagnosis Date  . Arthritis   . CAD (coronary artery disease)    s/p DES to LAD 06/2013  . Depression   . Dyslipidemia   . Glaucoma   . Hyperglycemia 04/17/2014  . Hyperlipidemia   . Hypertension   . Stroke ([place])   . Type 2 diabetes mellitus [facility])     Assessment: 85 year old male on IV heparin for PE and DVT now to transition to Apixaban per pharmacy consult.   Hgb is low stable at 10.3. Platelets are 196. No bleeding is noted. SCr is trending down at 1.65 and estimated CrCl is currently 28 mL/min (above 25 mL/min which was minimum creatinine clearance studied).    Goal of Therapy:  Monitor platelets by anticoagulation protocol: Yes   Plan:  Apixaban 10mg  po BID x7 days then 5mg  po BID.  Stop IV Heparin at the same time the first dose of Apixaban is administered.  Monitor CBC and for signs and  symptoms of bleeding.  Monitor renal function closely - may need to reconsider therapy if SCr trends upward.   Brain Hilts 06/04/2019,5:21 PM

## 2019-06-05 ENCOUNTER — Ambulatory Visit: Payer: Self-pay | Admitting: Family

## 2019-06-05 LAB — CBC
HCT: 28.9 % — ABNORMAL LOW (ref 39.0–52.0)
Hemoglobin: 9.1 g/dL — ABNORMAL LOW (ref 13.0–17.0)
MCH: 30 pg (ref 26.0–34.0)
MCHC: 31.5 g/dL (ref 30.0–36.0)
MCV: 95.4 fL (ref 80.0–100.0)
Platelets: 184 10*3/uL (ref 150–400)
RBC: 3.03 MIL/uL — ABNORMAL LOW (ref 4.22–5.81)
RDW: 13.4 % (ref 11.5–15.5)
WBC: 5.9 10*3/uL (ref 4.0–10.5)
nRBC: 0 % (ref 0.0–0.2)

## 2019-06-05 LAB — BASIC METABOLIC PANEL
Anion gap: 9 (ref 5–15)
BUN: 30 mg/dL — ABNORMAL HIGH (ref 8–23)
CO2: 26 mmol/L (ref 22–32)
Calcium: 8.6 mg/dL — ABNORMAL LOW (ref 8.9–10.3)
Chloride: 102 mmol/L (ref 98–111)
Creatinine, Ser: 1.48 mg/dL — ABNORMAL HIGH (ref 0.61–1.24)
GFR calc Af Amer: 46 mL/min — ABNORMAL LOW (ref >60)
GFR calc non Af Amer: 40 mL/min — ABNORMAL LOW (ref >60)
Glucose, Bld: 259 mg/dL — ABNORMAL HIGH (ref 70–99)
Potassium: 4.6 mmol/L (ref 3.5–5.1)
Sodium: 137 mmol/L (ref 135–145)

## 2019-06-05 LAB — GLUCOSE, CAPILLARY
Glucose-Capillary: 253 mg/dL — ABNORMAL HIGH (ref 70–99)
Glucose-Capillary: 195 mg/dL — ABNORMAL HIGH (ref 70–99)
Glucose-Capillary: 221 mg/dL — ABNORMAL HIGH (ref 70–99)
Glucose-Capillary: 142 mg/dL — ABNORMAL HIGH (ref 70–99)

## 2019-06-05 LAB — HEPARIN LEVEL (UNFRACTIONATED): Heparin Unfractionated: 0.73 IU/mL — ABNORMAL HIGH (ref 0.30–0.70)

## 2019-06-05 LAB — CK: Total CK: 454 U/L — ABNORMAL HIGH (ref 49–397)

## 2019-06-05 MED ORDER — INSULIN GLARGINE 100 UNIT/ML SOLOSTAR PEN: 7.0000 [IU] | PEN_INJECTOR | Freq: Every day | SUBCUTANEOUS | 0 refills | Status: DC

## 2019-06-05 NOTE — Progress Notes (Signed)
Patient suffers from ambulatory dysfunction which impairs their ability to perform daily activities like walking in the home. A walking aid will not resolve issue with performing activities of daily living. A wheelchair will allow patient to safely perform daily activities. Patient can safely propel the wheelchair in the home or has a caregiver who can provide assistance. Length of need 6 months.  Accessories: elevating leg rests, wheel locks, extensions and anti-tippers.

## 2019-06-05 NOTE — Plan of Care (Signed)
  Problem: Education: Goal: Knowledge of General Education information will improve Description Including pain rating scale, medication(s)/side effects and non-pharmacologic comfort measures Outcome: Progressing   Problem: Health Behavior/Discharge Planning: Goal: Ability to manage health-related needs will improve Outcome: Progressing   Problem: Clinical Measurements: Goal: Ability to maintain clinical measurements within normal limits will improve Outcome: Progressing Goal: Will remain free from infection Outcome: Progressing Goal: Diagnostic test results will improve Outcome: Progressing Goal: Respiratory complications will improve Outcome: Progressing Goal: Cardiovascular complication will be avoided Outcome: Progressing   Problem: Activity: Goal: Risk for activity intolerance will decrease Outcome: Progressing   Problem: Nutrition: Goal: Adequate nutrition will be maintained Outcome: Progressing   Problem: Coping: Goal: Level of anxiety will decrease Outcome: Progressing   Problem: Elimination: Goal: Will not experience complications related to bowel motility Outcome: Progressing Goal: Will not experience complications related to urinary retention Outcome: Progressing   Problem: Pain Managment: Goal: General experience of comfort will improve Outcome: Progressing   Problem: Safety: Goal: Ability to remain free from injury will improve Outcome: Progressing   Problem: Skin Integrity: Goal: Risk for impaired skin integrity will decrease Outcome: Progressing   Problem: Education: Goal: Knowledge of disease or condition will improve Outcome: Progressing Goal: Knowledge of secondary prevention will improve Outcome: Progressing Goal: Knowledge of patient specific risk factors addressed and post discharge goals established will improve Outcome: Progressing   Vincente Asbridge, BSN, RN 

## 2019-06-05 NOTE — Discharge Summary (Addendum)
Discharge Summary  [name] [id number] DOB: 1922-01-29  PCP: Gayland Curry, DO  Admit date: 06/01/2019 Discharge date: 06/05/2019  Time spent: 35 minutes  Recommendations for Outpatient Follow-up:  1. Follow-up with neurology 2. Follow-up with your primary care provider within a week. 3. Follow-up with neurosurgery 4. Take your medications as prescribed 5. Continue PT OT with home health services and fall precautions.  Discharge Diagnoses:  Active Hospital Problems   Diagnosis Date Noted   Pulmonary embolism ([place]) 06/01/2019   Acute renal failure superimposed on stage 3 chronic [name] disease ([place]) 06/01/2019   Chronic diastolic CHF (congestive heart failure) ([place]) 06/01/2019   Centrilobular emphysema ([place]) 04/13/2017   History of stroke    Moderate dementia with behavioral disturbance ([place]) 09/19/2015   Type 2 diabetes, controlled, with peripheral circulatory disorder [facility])    Essential hypertension    Coronary artery disease involving native coronary artery of native heart with angina pectoris [facility])     Resolved Hospital Problems  No resolved problems to display.    Discharge Condition: Stable  Diet recommendation:  Speech therapist recommendations: Diet recommendations: Dysphagia 2 (fine chop);Thin liquid Liquids provided via: Cup;Straw Medication Administration: Whole meds with puree Supervision: Staff to assist with self feeding Compensations: Minimize environmental distractions;Slow rate;Small sips/bites Postural Changes and/or Swallow Maneuvers: Seated upright 90 degrees  Vitals:   06/05/19 0339 06/05/19 0834  BP: 128/62 133/67  Pulse: (!) 101 90  Resp: 18 17  Temp: (!) 97.4 F (36.3 C) 97.7 F (36.5 C)  SpO2: 98% 98%    History of present illness:  [name] McGowanis a 85 y.o.malewith medical history significant forcoronary artery disease, history of CVA, insulin-dependent type II diabetes mellitus, hypertension, chronic [name]  disease stage IIIand chronic depression with behavioral disturbance, now presenting to the emergency department for evaluation of somnolence and weakness. Patient was started on Haldol a few days ago and has reportedly been very somnolent since then with multiple falls. Patient's daughter was concerned that his right arm and leg were weakerthan the left at approximately 1 PM today. Daughter was able to arrange a telemedicine visit with the patient's PCP who recommended further evaluation in the emergency department. Patient is unable to contribute to the history due to his clinical condition.  ED Course:Upon arrival to the ED, patient is found to be afebrile, saturating mid 90s on room air, and slightly tachycardic. EKG features sinus tachycardia with rate 104, RBBB, and nonspecific ST-T abnormalities. Noncontrast head CT is negative for acute findings. CTA head and neck is concerning for bilateral lower lobe PE, incidentally found. Chemistry panel features a creatinine of 2.08, up from 1.49 fourdays earlier. CBC features a mild normocytic anemia and clumped platelets. Neurology was consulted by the ED physician and the patient was started on IV heparin infusion for pulmonary embolism.   B/L lower extremity Doppler ultrasound completed on 06/03/2019 positive for DVT.  MRI brain without contrast completed on 06/04/2019 no acute CVA, MRI cervical spine completed on 06/04/2019 revealed cervical spine disease for which neurosurgery was consulted.  The case was evaluated by neurosurgery, Dr. Christella Noa no plan for surgical intervention at this time.  Recommended outpatient follow-up.  Switched heparin drip to Eliquis.  06/05/19: Patient was seen and examined at his bedside this morning.  No acute events overnight.  He has no new complaints.  Vital signs and labs reviewed and are stable.  On the day of discharge, the patient was hemodynamically stable.  He will need to follow-up with neurology,  neurosurgery, and his primary care provider posthospitalization.  Discussed with his daughter via phone on 06/04/2019.   Hospital Course:  Principal Problem:   Pulmonary embolism (HCC) Active Problems:   Type 2 diabetes, controlled, with peripheral circulatory disorder (HCC)   Essential hypertension   Coronary artery disease involving native coronary artery of native heart with angina pectoris (HCC)   Moderate dementia with behavioral disturbance (HCC)   History of stroke   Centrilobular emphysema (HCC)   Acute renal failure superimposed on stage 3 chronic [name] disease (HCC)   Chronic diastolic CHF (congestive heart failure) (H[facility])  Resolved acute metabolic encephalopathy; persistent R sided weakness -Presented with several days of somnolence since he was started on Haldol and then right arm and leg weakness noted by his daughter several hours prior to arrival -Head CT with no acute findings, CTA head and neck notable for lower lobe PE's bilaterally, incidental findings. MRI brain no acute intracranial findings Repeat MRI brain on 06/04/2019 no sign of acute CVA. MRI neck showed multilevel cervical spine disease. Evaluated by neurosurgery Dr. Christella Noa.  No plan for surgical intervention at this time.  Recommends follow-up outpatient. Evaluated by speech therapist w recs for dysphagia 2 diet. UA, Ammonia, B12, TSH unrevealing RPR non reactive  History of CVA On ASA 81 mg daily Simvastatin on hold due to elevated CPK Defer to neurology to restart statin possibly outpatient if appropriate after repeated CPK. Last LDL 74 on 06/02/19, goal < 4  Newly diagnosed Pulmonary embolism and bilateral lower extremity DVT, incidentally foundon CTA head and neck -Positive bilateral Doppler ultrasound for DVT on 06/03/2019. -CTA head and neck incidentally notable for bilateral pulmonary embolism -Completed heparin drip then switched to Eliquis on 06/04/19. -O2 saturation 100% on room  air  Diffuse degenerative disease involving C3-4 to C7-T1 with variable foraminal impingement with degenerative ventral cord flattening at C3-4 and C5-6. Evaluated by Dr. Christella Noa.  Discussed with the patient's daughter [name] on 06/04/2019 via phone.  Will follow-up outpatient.  Multiple pleural-based nodules throughout the right lung, relatively stable from prior CT in June 2020 However these have progressed since CT in 2018 No pathologically enlarged lymph nodes. Follow-up outpatient with PCP for surveillance.  Right shoulder pain post fall R shoulder xray done on 06/03/2019 no acute findings Analgesic as needed Follow up with your PCP  Resolving AKI on CKD III Baseline cr appears to be  1.4 with GFR 49 -presented with SCr is 2.08 on admission, up from 1.49 four days earlier -Improved on IV fluid hydration, creatinine 1.4 and GFR 46 on 06/05/2019.  2.5 L urine output recorded in the last 24 hours. Continue to avoid nephrotoxins like NSAIDs Follow-up with your PCP  Chronic diastolic CHF Euvolemic on exam BNP wjthin normal range  Insulin-dependent DM2 due to CKD -A1c was 6.3% in May 2020 -Continue insulin with dose-reduction in light of increased creatinine -Will need to follow-up with PCP within a week -Avoid hypoglycemia  Hypertension -Blood pressure is at goal -Continue to hold home antihypertensive to avoid hypotension  Dementiawith behavioral disturbances Stable.  On Haldol as needed. Continue Namzaric Follow-up with PCP.  BPH Stable with good urine output, 2.5 L urine output in the last 24 hr Continue tamsulosin  Chronic anxiety/depression Continue Lexapro Follow-up with your PCP  Resolving elevated CPK Presented with CPK >3800 CPK is trending down on IV fluid 454 on 06/05/2019. Statin on hold due to elevated CPK Follow up with neurology or PCP Defer to neurology or PCP to restart  statin once CPK has normalized  Elevated troponin likely  demand ischemia in the setting of newly diagnosed PE. Troponin peaked at 59 and trended down Denies anginal symptoms.  Physical debility/ambulatory dysfunction PT OT assessment recommended SNF.  Declined by family.    Daughter [name] would like to bring him home. Continue PT OT with home health services and fall precautions     Code Status:Full code, confirmed by daughter  Consults called:Neurology, neurosurgery 06/04/19    Discharge Exam: BP 133/67 (BP Location: Right Arm)    Pulse 90    Temp 97.7 F (36.5 C) (Oral)    Resp 17    Ht 5\' 11"  (1.803 m)    Wt 84.4 kg    SpO2 98%    BMI 25.95 kg/m   General: 85 y.o. year-old male well developed well nourished in no acute distress.  Alert and interactive.  Cardiovascular: Regular rate and rhythm with no rubs or gallops.  No thyromegaly or JVD noted.    Respiratory: Clear to auscultation with no wheezes or rales. Good inspiratory effort.  Abdomen: Soft nontender nondistended with normal bowel sounds x4 quadrants.  Musculoskeletal: No lower extremity edema.   Psychiatry: Mood is appropriate for condition and setting  Discharge Instructions You were cared for by a hospitalist during your hospital stay. If you have any questions about your discharge medications or the care you received while you were in the hospital after you are discharged, you can call the unit and asked to speak with the hospitalist on call if the hospitalist that took care of you is not available. Once you are discharged, your primary care physician will handle any further medical issues. Please note that NO REFILLS for any discharge medications will be authorized once you are discharged, as it is imperative that you return to your primary care physician (or establish a relationship with a primary care physician if you do not have one) for your aftercare needs so that they can reassess your need for medications and monitor your lab values.   Allergies as of  06/05/2019      Reactions   Lisinopril Cough      Medication List    STOP taking these medications   amLODipine 5 MG tablet Commonly known as: NORVASC   hydrochlorothiazide 25 MG tablet Commonly known as: HYDRODIURIL   Insulin Glargine (1 Unit Dial) 300 UNIT/ML Sopn Commonly known as: Toujeo SoloStar Replaced by: Insulin Glargine 100 UNIT/ML Solostar Pen   losartan 50 MG tablet Commonly known as: COZAAR   simvastatin 20 MG tablet Commonly known as: ZOCOR   torsemide 20 MG tablet Commonly known as: DEMADEX     TAKE these medications   Accu-Chek Aviva Soln Use as Directed. Dx: E11.51   accu-chek softclix lancets Use as instructed with checking blood sugar three times daily. Dx: E11.51   albuterol 108 (90 Base) MCG/ACT inhaler Commonly known as: VENTOLIN HFA INHALE TWO PUFFS EVERY 4-6 HOURS AS NEEDED FOR DIFFICULTY BREATHING,WHEEZING OR COUGH What changed:   how much to take  how to take this  when to take this  reasons to take this  additional instructions   apixaban 5 MG Tabs tablet Commonly known as: Eliquis Take 2 tablets (10mg ) twice daily for 7 days, then 1 tablet (5mg ) twice daily   aspirin EC 81 MG tablet Take 1 tablet (81 mg total) by mouth daily.   B-D SINGLE USE SWABS REGULAR Pads USE AS DIRECTED WITH CHECKING BLOOD SUGAR THREE TIMES DAILY  escitalopram 5 MG tablet Commonly known as: LEXAPRO TAKE 1 TABLET EVERY DAY   glucose blood test strip Commonly known as: Accu-Chek Aviva Use to test blood sugar three times daily. Dx: E11.51   haloperidol 5 MG tablet Commonly known as: HALDOL Take 1 tablet (5 mg total) by mouth every 6 (six) hours as needed for agitation. Give at dinnertime and at bedtime   Insulin Glargine 100 UNIT/ML Solostar Pen Commonly known as: LANTUS Inject 7 Units into the skin daily. Replaces: Insulin Glargine (1 Unit Dial) 300 UNIT/ML Sopn   Insulin Pen Needle 31G X 5 MM Misc Commonly known as: B-D UF III MINI PEN  NEEDLES USE AS DIRECTED.   Loratadine 10 MG Caps Take 1 capsule (10 mg total) by mouth daily.   mineral oil-hydrophilic petrolatum ointment Apply topically 2 (two) times daily. To legs   Namzaric 28-10 MG Cp24 Generic drug: Memantine HCl-Donepezil HCl TAKE 1 CAPSULE EVERY DAY   Sentry Senior Tabs Take 1 tablet by mouth daily.   tamsulosin 0.4 MG Caps capsule Commonly known as: FLOMAX Take 0.4 mg by mouth daily after supper.   triamcinolone cream 0.1 % Commonly known as: KENALOG APPLY 1 APPLICATION OF CREAM TWICE DAILY FOR 14 DAYS TO LOWER EXTRMITIES What changed:   how much to take  how to take this  when to take this  additional instructions            Durable Medical Equipment  (From admission, onward)         Start     Ordered   06/04/19 1717  For home use only DME standard manual wheelchair with seat cushion  Once    Comments: Patient suffers from ambulatory dysfunction which impairs their ability to perform daily activities like walking in the home.  A walking aid will not resolve issue with performing activities of daily living. A wheelchair will allow patient to safely perform daily activities. Patient can safely propel the wheelchair in the home or has a caregiver who can provide assistance. Length of need 6 months. Accessories: elevating leg rests, wheel locks, extensions and anti-tippers.   06/04/19 1716         Allergies  Allergen Reactions   Lisinopril Cough   Follow-up Information    Reed, Tiffany L, DO. Call in 1 day(s).   Specialty: Geriatric Medicine Why: Please call for a post hospital follow up appointment. Contact information: [address]. [place] [address] [phone]        [facility]. Call in 1 day(s).   Why: Please call for a post hospital follow up appointment. Contact information: [address]     [address] [place] [phone]       Ashok Pall, MD. Call  in 1 day(s).   Specialty: Neurosurgery Why: Please call for a post hospital follow up appointment. Contact information: [address] [place] [address] [phone]            The results of significant diagnostics from this hospitalization (including imaging, microbiology, ancillary and laboratory) are listed below for reference.    Significant Diagnostic Studies: Ct Angio Head W Or Wo Contrast  Result Date: 06/01/2019 CLINICAL DATA:  Progressive confusion over the last week. Right-sided weakness for 4 days. EXAM: CT ANGIOGRAPHY HEAD AND NECK TECHNIQUE: Multidetector CT imaging of the head and neck was performed using the standard protocol during bolus administration of intravenous contrast. Multiplanar CT image reconstructions and MIPs were  obtained to evaluate the vascular anatomy. Carotid stenosis measurements (when applicable) are obtained utilizing NASCET criteria, using the distal internal carotid diameter as the denominator. CONTRAST:  60mL OMNIPAQUE IOHEXOL 350 MG/ML SOLN COMPARISON:  CT head without contrast 06/01/2019. CT chest 01/18/2019 FINDINGS: CTA NECK FINDINGS Aortic arch: Extensive atherosclerotic calcifications are present in the aortic arch and proximal descending thoracic aorta. There is no aneurysm. Calcifications are greatest at the origin of the left subclavian artery. No significant stenosis is present in the great vessel origins. Right carotid system: The right common carotid artery is within normal limits. The bifurcation is unremarkable. The cervical right ICA is normal. Left carotid system: The left common carotid artery is within normal limits. Atherosclerotic calcifications are present at the bifurcation without a significant stenosis. The more distal cervical left ICA is otherwise normal. Vertebral arteries: The vertebral arteries are codominant. Both vertebral arteries originate from the subclavian arteries. Atherosclerotic calcifications are  present at the origin of the right vertebral artery with a 50% stenosis. There is no significant stenosis of either vertebral artery in the neck otherwise. Skeleton: Extensive degenerative changes are present within the cervical spine. Vertebral body heights are maintained. Calcification posterior longitudinal ligament at C3-4 and C4-5 contributes to moderate central canal stenosis at these levels. Multilevel foraminal disease is due to uncovertebral disease. No focal lytic or blastic lesions are present. The patient is edentulous. Other neck: The salivary glands and ducts. Are within No significant cervical adenopathy normal limits bilaterally is present. No focal mucosal or submucosal lesions are present. Upper chest: Centrilobular emphysematous changes are present. Mild dependent atelectasis is noted. Right inferior pulmonary embolus is noted. There is embolus extending into the left lower lobe pulmonary embolus is well. These are incompletely imaged. Right infrahilar soft tissue mass measures 2.2 x 1.4 cm. Right infrahilar adenopathy is present as well. 5 mm nodule is present posteriorly in the right upper lobe. Other smaller nodules are present in the right upper lobe as well. Review of the MIP images confirms the above findings CTA HEAD FINDINGS Anterior circulation: Atherosclerotic calcifications are present within the cavernous internal carotid arteries bilaterally without a significant stenosis relative to the more distal vessel. ICA termini are normal. The A1 and M1 segments are normal. The anterior communicating artery is patent. MCA bifurcations are intact. ACA and MCA branch vessels are normal. Posterior circulation: The vertebral arteries are codominant. PICA origins are visualized and normal. Vertebrobasilar junction is normal. Both posterior cerebral arteries originate from basilar tip. The PCA branch vessels are within normal limits. Venous sinuses: The dural sinuses are patent. The straight sinus  deep cerebral veins are intact. Cortical veins are unremarkable. Anatomic variants: None Review of the MIP images confirms the above findings IMPRESSION: 1. Bilateral lower lobe pulmonary emboli. 2. 50% stenosis at the origin of the right vertebral artery. 3. Atherosclerotic changes at the carotid bifurcations bilaterally without significant stenosis. 4. No significant proximal stenosis, aneurysm, or branch vessel occlusion within the Circle of Willis. 5. Extensive atherosclerotic changes within the aortic arch and proximal descending thoracic aorta without aneurysm. 6. Multilevel spondylosis of the cervical spine with a calcified posterior longitudinal ligament at C3-4 and C4-5 resulting in moderate central canal stenosis at these levels. Electronically Signed   By: San Morelle M.D.   On: 06/01/2019 21:00   Ct Head Wo Contrast  Result Date: 06/01/2019 CLINICAL DATA:  Focal neuro deficit for greater than 6 hours, increasing confusion and combativeness for 1 week EXAM: CT HEAD WITHOUT CONTRAST  TECHNIQUE: Contiguous axial images were obtained from the base of the skull through the vertex without intravenous contrast. COMPARISON:  CT 04/06/2017 FINDINGS: Brain: Stable regions of gliosis in the right centrum semiovale, left frontal pole and left occipital lobe compatible with remote infarct. No evidence of acute infarction, hemorrhage, or hydrocephalus. Remote right parietal hygroma/chronic subdural has since resolved from comparison exam. No visible extra-axial collection or mass lesion/mass effect. Symmetric prominence of the ventricles, cisterns and sulci compatible with parenchymal volume loss. Patchy areas of white matter hypoattenuation are most compatible with chronic microvascular angiopathy. Vascular: Atherosclerotic calcification of the carotid siphons and intradural vertebral arteries. No hyperdense vessel. Skull: No calvarial fracture or suspicious osseous lesion. No scalp swelling or hematoma.  Sinuses/Orbits: Mural thickening in the anterior ethmoids and right maxillary sinus. No air-fluid levels. Orbital structures are unremarkable aside from prior lens extractions. Other: Edentulous with mandibular prognathism temporomandibular joints remain normally aligned. IMPRESSION: 1. No acute intracranial abnormality. If persisting concern for infarct, MRI is more sensitive and specific. 2. Stable remote infarcts in the right and left frontal and left occipital lobes. 3. Remote right parietal hygroma/chronic subdural has since resolved from comparison exam. 4. Chronic microvascular ischemic changes and parenchymal volume loss. Electronically Signed   By: Lovena Le M.D.   On: 06/01/2019 19:55   Ct Angio Neck W And/or Wo Contrast  Result Date: 06/01/2019 CLINICAL DATA:  Progressive confusion over the last week. Right-sided weakness for 4 days. EXAM: CT ANGIOGRAPHY HEAD AND NECK TECHNIQUE: Multidetector CT imaging of the head and neck was performed using the standard protocol during bolus administration of intravenous contrast. Multiplanar CT image reconstructions and MIPs were obtained to evaluate the vascular anatomy. Carotid stenosis measurements (when applicable) are obtained utilizing NASCET criteria, using the distal internal carotid diameter as the denominator. CONTRAST:  75mL OMNIPAQUE IOHEXOL 350 MG/ML SOLN COMPARISON:  CT head without contrast 06/01/2019. CT chest 01/18/2019 FINDINGS: CTA NECK FINDINGS Aortic arch: Extensive atherosclerotic calcifications are present in the aortic arch and proximal descending thoracic aorta. There is no aneurysm. Calcifications are greatest at the origin of the left subclavian artery. No significant stenosis is present in the great vessel origins. Right carotid system: The right common carotid artery is within normal limits. The bifurcation is unremarkable. The cervical right ICA is normal. Left carotid system: The left common carotid artery is within normal  limits. Atherosclerotic calcifications are present at the bifurcation without a significant stenosis. The more distal cervical left ICA is otherwise normal. Vertebral arteries: The vertebral arteries are codominant. Both vertebral arteries originate from the subclavian arteries. Atherosclerotic calcifications are present at the origin of the right vertebral artery with a 50% stenosis. There is no significant stenosis of either vertebral artery in the neck otherwise. Skeleton: Extensive degenerative changes are present within the cervical spine. Vertebral body heights are maintained. Calcification posterior longitudinal ligament at C3-4 and C4-5 contributes to moderate central canal stenosis at these levels. Multilevel foraminal disease is due to uncovertebral disease. No focal lytic or blastic lesions are present. The patient is edentulous. Other neck: The salivary glands and ducts. Are within No significant cervical adenopathy normal limits bilaterally is present. No focal mucosal or submucosal lesions are present. Upper chest: Centrilobular emphysematous changes are present. Mild dependent atelectasis is noted. Right inferior pulmonary embolus is noted. There is embolus extending into the left lower lobe pulmonary embolus is well. These are incompletely imaged. Right infrahilar soft tissue mass measures 2.2 x 1.4 cm. Right infrahilar adenopathy is present  as well. 5 mm nodule is present posteriorly in the right upper lobe. Other smaller nodules are present in the right upper lobe as well. Review of the MIP images confirms the above findings CTA HEAD FINDINGS Anterior circulation: Atherosclerotic calcifications are present within the cavernous internal carotid arteries bilaterally without a significant stenosis relative to the more distal vessel. ICA termini are normal. The A1 and M1 segments are normal. The anterior communicating artery is patent. MCA bifurcations are intact. ACA and MCA branch vessels are normal.  Posterior circulation: The vertebral arteries are codominant. PICA origins are visualized and normal. Vertebrobasilar junction is normal. Both posterior cerebral arteries originate from basilar tip. The PCA branch vessels are within normal limits. Venous sinuses: The dural sinuses are patent. The straight sinus deep cerebral veins are intact. Cortical veins are unremarkable. Anatomic variants: None Review of the MIP images confirms the above findings IMPRESSION: 1. Bilateral lower lobe pulmonary emboli. 2. 50% stenosis at the origin of the right vertebral artery. 3. Atherosclerotic changes at the carotid bifurcations bilaterally without significant stenosis. 4. No significant proximal stenosis, aneurysm, or branch vessel occlusion within the Circle of Willis. 5. Extensive atherosclerotic changes within the aortic arch and proximal descending thoracic aorta without aneurysm. 6. Multilevel spondylosis of the cervical spine with a calcified posterior longitudinal ligament at C3-4 and C4-5 resulting in moderate central canal stenosis at these levels. Electronically Signed   By: San Morelle M.D.   On: 06/01/2019 21:00   Ct Chest Wo Contrast  Result Date: 06/04/2019 CLINICAL DATA:  Adenopathy EXAM: CT CHEST WITHOUT CONTRAST TECHNIQUE: Multidetector CT imaging of the chest was performed following the standard protocol without IV contrast. COMPARISON:  January 18, 2019 CT of the chest FINDINGS: Cardiovascular: The heart size is normal. Coronary artery calcifications are noted. There are atherosclerotic calcifications of the thoracic aorta. There is no significant pericardial effusion. Mediastinum/Nodes: --No mediastinal or hilar lymphadenopathy. --No axillary lymphadenopathy. --No supraclavicular lymphadenopathy. --Normal thyroid gland. --The esophagus is unremarkable Lungs/Pleura: Again identified are scattered bilateral pleural based pulmonary nodules. The overall distribution in size of these nodules is similar  to prior CT in June of 2020. There is a small right-sided pleural effusion, significantly decreased in size from prior study in June. There is some mucous plugging and bronchial wall thickening at the lung bases, right worse than left Upper Abdomen: No acute abnormality. Musculoskeletal: No chest wall abnormality. No acute or significant osseous findings. There is asymmetric gynecomastia involving the right chest wall. Review of the MIP images confirms the above findings. IMPRESSION: 1. No pathologically enlarged lymph nodes. 2. Again noted are multiple pleural based nodules throughout the right lung, relatively stable from prior CT in June of 2020. However, these have progressed since CT in 2018. Differential considerations include noncalcified pleural based plaques, pleural metastases, or primary pleural neoplasm such as mesothelioma. 3. Small right-sided pleural effusion, significantly improved from prior study. Aortic Atherosclerosis (ICD10-I70.0). Electronically Signed   By: Constance Holster M.D.   On: 06/04/2019 05:27   Mr Brain Wo Contrast  Result Date: 06/04/2019 CLINICAL DATA:  New onset right-sided weakness in the setting of pulmonary embolism EXAM: MRI HEAD WITHOUT CONTRAST TECHNIQUE: Multiplanar, multiecho pulse sequences of the brain and surrounding structures were obtained without intravenous contrast. COMPARISON:  Two days ago FINDINGS: Brain: No acute infarction, hemorrhage, hydrocephalus, extra-axial collection or mass lesion. Moderate area of encephalomalacia in the anterior left frontal lobe, best attributed to chronic infarct. Minimal small vessel ischemic change in the white matter  and pons for age. Cerebral volume loss without specific pattern, in keeping with age. Vascular: Normal flow voids Skull and upper cervical spine: Mild right-sided scalp swelling. Normal marrow signal Sinuses/Orbits: Opacified right anterior ethmoid air cells with some volume loss. Bilateral cataract resection  IMPRESSION: 1. No acute finding including infarct. 2. Aging brain with remote left frontal infarct. Electronically Signed   By: Monte Fantasia M.D.   On: 06/04/2019 05:19   Mr Brain Wo Contrast  Result Date: 06/02/2019 CLINICAL DATA:  Focal neuro deficit, greater than 6 hours, stroke suspected. Multiple comorbidities. Patient started on Haldol a few days ago. New onset right upper and lower extremity weakness beginning yesterday. EXAM: MRI HEAD WITHOUT CONTRAST TECHNIQUE: Multiplanar, multiecho pulse sequences of the brain and surrounding structures were obtained without intravenous contrast. COMPARISON:  CT head without contrast and CTA head and neck 06/01/2019 FINDINGS: Brain: The diffusion-weighted images demonstrate no acute or subacute infarction. Remote anterior left frontal lobe infarct is again noted. There is associated volume loss. Confluent periventricular white matter changes are otherwise within normal limits for age. Dilated perivascular spaces are present throughout the basal ganglia. White matter changes extend into the brainstem. The cerebellum is unremarkable. No acute hemorrhage or mass lesion is present. There is no mass effect. The ventricles are proportionate to the degree of atrophy. No significant extraaxial fluid collection is present. Vascular: Flow is present in the major intracranial arteries. Skull and upper cervical spine: Mild degenerative changes are present in the upper cervical spine. Craniocervical junction is normal. Midline structures are unremarkable. Sinuses/Orbits: Anterior right ethmoid air cells are opacified. Frontal sinuses are not pneumatized. Mild mucosal thickening is present in the right maxillary sinus. No fluid levels are present. Minimal fluid is present in the posterior mastoid air cells bilaterally. No obstructing lesions are evident. Bilateral lens replacements are present. Globes and orbits are within normal limits. IMPRESSION: 1. No acute intracranial  abnormality or significant interval change. 2. Remote anterior left frontal lobe infarct. 3. Mild atrophy and white matter disease is otherwise within normal limits for age. Electronically Signed   By: San Morelle M.D.   On: 06/02/2019 10:06   Mr Cervical Spine Wo Contrast  Result Date: 06/04/2019 CLINICAL DATA:  Acute or progressive myelopathy EXAM: MRI CERVICAL SPINE WITHOUT CONTRAST TECHNIQUE: Multiplanar, multisequence MR imaging of the cervical spine was performed. No intravenous contrast was administered. COMPARISON:  None. FINDINGS: Alignment: Straightening of the cervical spine. No traumatic malalignment Vertebrae: No occult fracture, discitis, or aggressive bone lesion. Cord: Degenerative impingement at C3-4 and C5-6, but no detected cord signal abnormality. Motion is a limiting factor Posterior Fossa, vertebral arteries, paraspinal tissues: Thin layer ofprevertebral edema. Interspinous STIR hyperintensity at C6-7 and to a lesser extent at C5-6, without major ligamentous disruption or fracture on preceding CTA. Disc levels: C2-3: Mild asymmetric left facet spurring. C3-4: Advanced disc narrowing with downward pointing disc protrusion flattening the ventral cord. Biforaminal impingement, greater on the left, due to disc height loss and uncovertebral ridging with bulging. C4-5: Advanced disc narrowing with endplate and uncovertebral ridging and disc bulging. Spinal stenosis with disc osteophyte complex contacting the ventral cord. Biforaminal impingement greater on the left C5-6: Disc narrowing and bulging with superimposed central and right foraminal protrusion. There is spinal stenosis with ventral cord flattening. Advanced right foraminal impingement C6-7: Disc narrowing and bulging with a left paracentral protrusion and buttressing osteophytes. Uncovertebral spurring on both sides. Spinal stenosis with disc osteophyte complex contacting the ventral cord. The left foraminal  protrusion deforms  the C7 nerve root on sagittal images. There is also right foraminal impingement C7-T1:Disc narrowing and bulging with a left foraminal protrusion causing advanced foraminal impingement. IMPRESSION: 1. Mild prevertebral and interspinous fluid signal suggesting strain. No occult fracture or visible cord contusion. 2. Diffuse degenerative disease with variable foraminal impingement from C3-4 to C7-T1, as above. 3. Degenerative ventral cord flattening at C3-4 and C5-6. Electronically Signed   By: Monte Fantasia M.D.   On: 06/04/2019 05:28   Dg Shoulder Right Port  Result Date: 06/03/2019 CLINICAL DATA:  RIGHT shoulder pain, no known injury EXAM: PORTABLE RIGHT SHOULDER COMPARISON:  None FINDINGS: Osseous demineralization. Degenerative changes at [facility] joint with inferior acromial and clavicular spur formation. No acute fracture, dislocation, or bone destruction. Minimal chondrocalcinosis at shoulder joint. Visualized ribs intact. IMPRESSION: AC joint degenerative changes with inferior acromial and clavicular spur formation which may predispose the patient to rotator cuff pathology. Chondrocalcinosis at shoulder joint question CPPD. No acute abnormalities. Electronically Signed   By: Lavonia Dana M.D.   On: 06/03/2019 18:10   Vas [place] Lower Extremity Venous (dvt)  Result Date: 06/03/2019  Lower Venous Study Indications: Pulmonary embolism.  Comparison Study: No prior study on file Performing Technologist: Sharion Dove RVS  Examination Guidelines: A complete evaluation includes B-mode imaging, spectral Doppler, color Doppler, and power Doppler as needed of all accessible portions of each vessel. Bilateral testing is considered an integral part of a complete examination. Limited examinations for reoccurring indications may be performed as noted.  +---------+---------------+---------+-----------+----------+--------------+  RIGHT     Compressibility Phasicity Spontaneity Properties Thrombus Aging   +---------+---------------+---------+-----------+----------+--------------+  CFV       Full            Yes       Yes                                    +---------+---------------+---------+-----------+----------+--------------+  SFJ       Full                                                             +---------+---------------+---------+-----------+----------+--------------+  FV Prox   Full                                                             +---------+---------------+---------+-----------+----------+--------------+  FV Mid    Full                                                             +---------+---------------+---------+-----------+----------+--------------+  FV Distal Full                                                             +---------+---------------+---------+-----------+----------+--------------+  PFV       Full                                                             +---------+---------------+---------+-----------+----------+--------------+  POP       Full            Yes       Yes                                    +---------+---------------+---------+-----------+----------+--------------+  PTV       None            No        No                                     +---------+---------------+---------+-----------+----------+--------------+  PERO      None            No        No                                     +---------+---------------+---------+-----------+----------+--------------+   +---------+---------------+---------+-----------+----------+--------------+  LEFT      Compressibility Phasicity Spontaneity Properties Thrombus Aging  +---------+---------------+---------+-----------+----------+--------------+  CFV       Full            Yes       Yes                                    +---------+---------------+---------+-----------+----------+--------------+  SFJ       Full                                                              +---------+---------------+---------+-----------+----------+--------------+  FV Prox   Full                                                             +---------+---------------+---------+-----------+----------+--------------+  FV Mid    Full                                                             +---------+---------------+---------+-----------+----------+--------------+  FV Distal Full                                                             +---------+---------------+---------+-----------+----------+--------------+  PFV       Full                                                             +---------+---------------+---------+-----------+----------+--------------+  POP       Full            Yes       Yes                                    +---------+---------------+---------+-----------+----------+--------------+  PTV       None                                             Acute           +---------+---------------+---------+-----------+----------+--------------+  PERO      None                                             Acute           +---------+---------------+---------+-----------+----------+--------------+     *See table(s) above for measurements and observations. Electronically signed by Deitra Mayo MD on 06/03/2019 at 2:46:05 PM.    Final     Microbiology: Recent Results (from the past 240 hour(s))  SARS CORONAVIRUS 2 (TAT 6-24 HRS) Nasopharyngeal Nasopharyngeal Swab     Status: None   Collection Time: 06/01/19  9:56 PM   Specimen: Nasopharyngeal Swab  Result Value Ref Range Status   SARS Coronavirus 2 NEGATIVE NEGATIVE Final    Comment: (NOTE) SARS-CoV-2 target nucleic acids are NOT DETECTED. The SARS-CoV-2 RNA is generally detectable in upper and lower respiratory specimens during the acute phase of infection. Negative results do not preclude SARS-CoV-2 infection, do not rule out co-infections with other pathogens, and should not be used as the sole basis for treatment or  other patient management decisions. Negative results must be combined with clinical observations, patient history, and epidemiological information. The expected result is Negative. Fact Sheet for Patients: SugarRoll.be Fact Sheet for Healthcare Providers: https://www.woods-mathews.com/ This test is not yet approved or cleared by the [place] FDA and  has been authorized for detection and/or diagnosis of SARS-CoV-2 by FDA under an Emergency Use Authorization (EUA). This EUA will remain  in effect (meaning this test can be used) for the duration of the COVID-19 declaration under Section 56 4(b)(1) of the Act, 21 U.S.C. section 360bbb-3(b)(1), unless the authorization is terminated or revoked sooner. Performed at [facility], [address] [place], [place] [address]      Labs: Basic Metabolic Panel: Recent Labs  Lab 06/01/19 1921 06/01/19 2111 06/02/19 0250 06/04/19 0541 06/05/19 0305  NA 141 141 142 142 137  K 4.8 4.0 4.2 4.3 4.6  CL 100 99 102 103 102  CO2 29  --  25 30 26   GLUCOSE 112* 97 94 119* 259*  BUN 29* 29* 29* 29* 30*  CREATININE 2.08* 2.00* 1.79* 1.65* 1.48*  CALCIUM 9.6  --  9.4 8.7*  8.6*   Liver Function Tests: Recent Labs  Lab 06/01/19 1921  AST 166*  ALT 67*  ALKPHOS 50  BILITOT 1.7*  PROT 6.6  ALBUMIN 3.6   No results for input(s): LIPASE, AMYLASE in the last 168 hours. Recent Labs  Lab 06/01/19 2202  AMMONIA 29   CBC: Recent Labs  Lab 06/01/19 1921 06/01/19 2111 06/01/19 2202 06/02/19 0250 06/03/19 0420 06/04/19 0541 06/05/19 0305  WBC 8.1  --   --  8.4 7.0 5.7 5.9  NEUTROABS 5.0  --   --   --   --   --   --   HGB 11.7* 10.9*  --  11.6* 10.3* 10.3* 9.1*  HCT 35.5* 32.0* 33.2* 36.4* 31.3* 31.4* 28.9*  MCV 94.2  --   --  95.0 92.9 94.6 95.4  PLT PLATELET CLUMPS NOTED ON SMEAR, UNABLE TO ESTIMATE  --   --  174 188 196 184   Cardiac Enzymes: Recent Labs  Lab 06/01/19 2202  06/03/19 0420 06/04/19 0541 06/05/19 0305  CKTOTAL 3,868* 1,095* 693* 454*   BNP: BNP (last 3 results) Recent Labs    06/01/19 1921  BNP 65.8    ProBNP (last 3 results) No results for input(s): PROBNP in the last 8760 hours.  CBG: Recent Labs  Lab 06/04/19 1200 06/04/19 1655 06/04/19 2129 06/05/19 0327 06/05/19 0602  GLUCAP 138* 253* 224* 253* 195*       Signed:  Kayleen Memos, MD [facility] 06/05/2019, 10:42 AM

## 2019-06-05 NOTE — TOC Transition Note (Signed)
Transition of Care [facility]) - CM/SW Discharge Note   Patient Details  Name: [name] MRN: [id number] Date of Birth: 19-Dec-1921  [facility] CM/SW Contact:  Pollie Friar, RN Phone Number: 06/05/2019, 1:16 PM   Clinical Narrative:    Pt discharging home with [facility] services through [facility]. Tiffany with [facility] accepted the referral.  Wheelchair delivered to the room and daughter took it home.  Pt to transport home via [facility]. D/c packet at the desk. Bedside RN updated and address verified with daughter.    Final next level of care: Home w Home Health Services Barriers to Discharge: No Barriers Identified   Patient Goals and CMS Choice Patient states their goals for this hospitalization and ongoing recovery are:: Pt daughter wants the patient to come home with her CMS Medicare.gov Compare Post Acute Care list provided to:: Patient Represenative (must comment) Choice offered to / list presented to : Adult Children  Discharge Placement                Patient to be transferred to facility by: [facility] to home Name of family member notified: Mary-daughter    Discharge Plan and Services In-house Referral: Clinical Social Work Discharge Planning Services: NA Post Acute Care Choice: Home Health          DME Arranged: Industrial/product designer)   Date DME Agency Contacted: 06/05/19   Representative spoke with at DME Agency: Suanne Marker HH Arranged: RN, PT, OT, Nurse's Aide [place] Agency: [facility] (formerly [employer]) Date Makena: 06/05/19   Representative spoke with at [place]: [place] ([place]) Interventions     Readmission Risk Interventions No flowsheet data found.

## 2019-06-05 NOTE — Progress Notes (Signed)
Inpatient Diabetes Program Recommendations  AACE/ADA: New Consensus Statement on Inpatient Glycemic Control (2015)  Target Ranges:  Prepandial:   less than 140 mg/dL      Peak postprandial:   less than 180 mg/dL (1-2 hours)      Critically ill patients:  140 - 180 mg/dL   Lab Results  Component Value Date   GLUCAP 195 (H) 06/05/2019   HGBA1C 6.3 (H) 06/02/2019    Review of Glycemic Control Results for PARRY, LEGATE (MRN [id number]) as of 06/05/2019 10:13  Ref. Range 06/04/2019 16:55 06/04/2019 21:29 06/05/2019 03:27 06/05/2019 06:02  Glucose-Capillary Latest Ref Range: 70 - 99 mg/dL 253 (H) 224 (H) 253 (H) 195 (H)   Diabetes history: Type 2 DM Outpatient Diabetes medications: Toujeo 65 units QHS Current orders for Inpatient glycemic control: Novolog 0-9 units TID, Novolog 0-5 units QHS  Inpatient Diabetes Program Recommendations:    If to remain inpatient, consider adding portion of basal: Lantus 8 units QD.   In preparation for discharge, would be hesitant with resuming Toujeo 65 units QHS. This dose is not consistent with inpatient requirements and given age and current A1C may consider decreasing dose.   Thanks, Bronson Curb, MSN, RNC-OB Diabetes Coordinator [phone] (8a-5p)

## 2019-06-05 NOTE — Progress Notes (Signed)
Pt discharge home to daughter; discharge education and instructions completed with pt and daughter at bedside; IV and telemetry removed; [facility] picked up pt to transport off to disposition. Delia Heady RN

## 2019-06-06 ENCOUNTER — Telehealth: Payer: Self-pay | Admitting: *Deleted

## 2019-06-06 NOTE — Telephone Encounter (Signed)
Transition Care Management Follow-up Telephone Call  Date of discharge and from where: 06/05/2019 [place]  How have you been since you were released from the hospital? Better but can't get out of house  Any questions or concerns? Yes  Daughter stated that she is very disappointed in our practice because she can never see the Dr. Lenon Ahmadi that she is tired of coming into the office and having to wait. Stated that she is going to find her dad another Dr. As soon as she can.   Items Reviewed:  Did the pt receive and understand the discharge instructions provided? Yes   Medications obtained and verified? Yes   Any new allergies since your discharge? No   Dietary orders reviewed? Yes  Do you have support at home? Yes   Other (ie: DME, Home Health, etc) Home Health  Functional Questionnaire: (I = Independent and D = Dependent) ADL's: D  Bathing/Dressing- D   Meal Prep- D  Eating- I with assistance  Maintaining continence- I  Transferring/Ambulation- D Wheelchair  Managing Meds- D   Follow up appointments reviewed:    PCP Hospital f/u appt confirmed? Yes  Scheduled to see Dinah on 06/07/19 .  [facility] f/u appt confirmed? No   Are transportation arrangements needed? No   If their condition worsens, is the pt aware to call  their PCP or go to the ED? Yes  Was the patient provided with contact information for the PCP's office or ED? Yes  Was the pt encouraged to call back with questions or concerns? Yes

## 2019-06-07 ENCOUNTER — Encounter: Payer: Self-pay | Admitting: Family

## 2019-06-07 ENCOUNTER — Other Ambulatory Visit: Payer: Self-pay

## 2019-06-07 ENCOUNTER — Ambulatory Visit (INDEPENDENT_AMBULATORY_CARE_PROVIDER_SITE_OTHER): Payer: Medicare HMO | Admitting: Family

## 2019-06-07 DIAGNOSIS — R2681 Unsteadiness on feet: Secondary | ICD-10-CM | POA: Diagnosis not present

## 2019-06-07 DIAGNOSIS — R531 Weakness: Secondary | ICD-10-CM

## 2019-06-07 DIAGNOSIS — R600 Localized edema: Secondary | ICD-10-CM | POA: Diagnosis not present

## 2019-06-07 DIAGNOSIS — I5032 Chronic diastolic (congestive) heart failure: Secondary | ICD-10-CM

## 2019-06-07 DIAGNOSIS — R5381 Other malaise: Secondary | ICD-10-CM | POA: Diagnosis not present

## 2019-06-07 DIAGNOSIS — N401 Enlarged prostate with lower urinary tract symptoms: Secondary | ICD-10-CM | POA: Diagnosis not present

## 2019-06-07 DIAGNOSIS — F0391 Unspecified dementia with behavioral disturbance: Secondary | ICD-10-CM

## 2019-06-07 DIAGNOSIS — I2699 Other pulmonary embolism without acute cor pulmonale: Secondary | ICD-10-CM | POA: Diagnosis not present

## 2019-06-07 DIAGNOSIS — F03B18 Unspecified dementia, moderate, with other behavioral disturbance: Secondary | ICD-10-CM

## 2019-06-07 DIAGNOSIS — E1151 Type 2 diabetes mellitus with diabetic peripheral angiopathy without gangrene: Secondary | ICD-10-CM

## 2019-06-07 NOTE — Progress Notes (Signed)
This service is provided via telemedicine  No vital signs collected/recorded due to the encounter was a telemedicine visit.   Location of patient (ex: home, work):  Home   Patient consents to a telephone visit:  Yes  Location of the provider (ex: office, home):  Office   Name of any referring provider: Hollace Kinnier, [place] of all persons participating in the telemedicine service and their role in the encounter:  Marlowe Sax, NP, Ruthell Rummage CMA, Patient and daughter   Time spent on call:  Ruthell Rummage CMA spent 6 minutes  on phone with patient.    Provider: Dinah Ngetich FNP-C  Gayland Curry, DO  Patient Care Team: Gayland Curry, DO as PCP - General (Geriatric Medicine) Macarthur Critchley, [place] as Referring Physician (Optometry) Delice Lesch Lezlie Octave, MD as Consulting Physician (Neurology)  Extended Emergency Contact Information Primary Emergency Contact: [facility] Address: [address]          [place], [place] [address] [name] of [place] Phone: [phone] Relation: Daughter Secondary Emergency Contact: [name] [place] of [place] Phone: [phone] Mobile Phone: [phone] Relation: Other  Code Status: Full Code  Goals of care: Advanced Directive information Advanced Directives 06/09/2019  Does Patient Have a Medical Advance Directive? Yes  Type of Advance Directive [place]  Does patient want to make changes to medical advance directive? No - Patient declined  Copy of [place] in Chart? No - copy requested  Would patient like information on creating a medical advance directive? -     Chief Complaint  Patient presents with  . Transitions Of Care    Hospitalization 10/30 - 11/3 Pulmonary embolism (HCC) changed diabetic medication would like to discuss     HPI:  Pt is a 85 y.o. male seen today for an acute visit for follow up hospitalization 06/01/2019 - 06/05/2019 for pulmonary  embolism.Daughter would also like to discuss medication changed during hospitalization.states Tujeo was discontinued and lantus 7 units was started.she picked up medication from pharmacy and does not know how to administer lantus using the pen given.she states has been using Tujeo instead of Lantus.He has a medical history of Hypertension,Type 2 DM,CKD stage 3,CAD,Hx of stroke,Dementia with Behavioral disturbance, chronic Depression among other condition.He present to the ED with increased weakness and somnolence.Daughter states right side weakness and somnolence started after he was given Haldol in the ED 05/28/2019.He had a Head CT which was negative for acute abnormalities.Head and neck CTA  Showed bilateral lower lobe PE's incidental findings.Also had bilateral doppler ultrasound which was positive for DVT on 06/03/2019 he was treated with heparin drip then switched to [facility] 06/04/2019. His MRI of the brain showed no acute CVA.MRI of the neck showed multilevel cervical spine disease.He was seen by Neurosurgery Dr.Cabbell no surgery was recommended will follow up as outpatient. X-ray of Right shoulder due to pain post fall was negative for acute findings.CR 2.08 improved with hydration to baseline 1.4   Daughter states unable to care for patient especially transfer.She states patient has been on the bed since hospital discharge.He will require a semi [facility] bed  with  rails to allow repositioning in ways not feasible with a normal bed. Patient has right side weakness and leg edema which requires elevation in bed greater than 30 degrees.Patient  require frequent changes in body position which cannot be achieved with a normal bed.He also has Chronic congestive heart failure that requires elevation of bed.  Past Medical History:  Diagnosis Date  . Arthritis   . CAD (coronary artery disease)    s/p DES to LAD 06/2013  . Depression   . Dyslipidemia   . Glaucoma   . Hyperglycemia 04/17/2014   . Hyperlipidemia   . Hypertension   . Stroke ([place])   . Type 2 diabetes mellitus ([place])    Past Surgical History:  Procedure Laterality Date  . CARDIAC CATHETERIZATION  06/2013   DES to LAD by Dr Tamala Julian  . Right Cataract      Allergies  Allergen Reactions  . Lisinopril Cough    No facility-administered encounter medications on file as of 06/07/2019.    Outpatient Encounter Medications as of 06/07/2019  Medication Sig  . albuterol (PROVENTIL HFA;VENTOLIN HFA) 108 (90 Base) MCG/ACT inhaler INHALE TWO PUFFS EVERY 4-6 HOURS AS NEEDED FOR DIFFICULTY BREATHING,WHEEZING OR COUGH (Patient taking differently: Inhale 2 puffs into the lungs every 4 (four) hours as needed for wheezing or shortness of breath. INHALE TWO PUFFS EVERY 4-6 HOURS AS NEEDED FOR DIFFICULTY BREATHING,WHEEZING OR COUGH)  . Alcohol Swabs (B-D SINGLE USE SWABS REGULAR) PADS USE AS DIRECTED WITH CHECKING BLOOD SUGAR THREE TIMES DAILY  . apixaban (ELIQUIS) 5 MG TABS tablet Take 2 tablets (10mg ) twice daily for 7 days, then 1 tablet (5mg ) twice daily  . aspirin EC 81 MG tablet Take 1 tablet (81 mg total) by mouth daily.  . Blood Glucose Calibration (ACCU-CHEK AVIVA) SOLN Use as Directed. Dx: E11.51  . escitalopram (LEXAPRO) 5 MG tablet TAKE 1 TABLET EVERY DAY (Patient taking differently: Take 5 mg by mouth daily. TAKE 1 TABLET EVERY DAY)  . glucose blood (ACCU-CHEK AVIVA) test strip Use to test blood sugar three times daily. Dx: E11.51  . haloperidol (HALDOL) 5 MG tablet Take 1 tablet (5 mg total) by mouth every 6 (six) hours as needed for agitation. Give at dinnertime and at bedtime (Patient not taking: Reported on 06/09/2019)  . Insulin Glargine (LANTUS) 100 UNIT/ML Solostar Pen Inject 7 Units into the skin daily. (Patient not taking: Reported on 06/09/2019)  . Insulin Pen Needle (B-D UF III MINI PEN NEEDLES) 31G X 5 MM MISC USE AS DIRECTED.  Elmore Guise Devices (ACCU-CHEK SOFTCLIX) lancets Use as instructed with checking blood sugar  three times daily. Dx: E11.51  . Loratadine 10 MG CAPS Take 1 capsule (10 mg total) by mouth daily.  . mineral oil-hydrophilic petrolatum (AQUAPHOR) ointment Apply topically 2 (two) times daily. To legs (Patient not taking: Reported on 06/09/2019)  . Multiple Vitamins-Minerals (SENTRY SENIOR) TABS Take 1 tablet by mouth daily.  [name] NAMZARIC 28-10 MG CP24 TAKE 1 CAPSULE EVERY DAY (Patient taking differently: Take 1 capsule by mouth daily. )  . tamsulosin (FLOMAX) 0.4 MG CAPS capsule Take 0.4 mg by mouth daily after supper.  . triamcinolone cream (KENALOG) 0.1 % APPLY 1 APPLICATION OF CREAM TWICE DAILY FOR 14 DAYS TO LOWER EXTRMITIES (Patient taking differently: Apply 1 application topically 2 (two) times daily. APPLY 1 APPLICATION OF CREAM TWICE DAILY FOR 14 DAYS TO LOWER EXTRMITIES)    Review of Systems  Constitutional: Negative for appetite change, chills, fatigue and fever.  HENT: Negative for congestion, rhinorrhea, sinus pressure, sinus pain, sneezing, sore throat and tinnitus.   Eyes: Negative for pain, discharge, redness, itching and visual disturbance.  Respiratory: Negative for cough, chest tightness, shortness of breath and wheezing.   Cardiovascular: Positive for leg swelling. Negative for chest pain and palpitations.  Gastrointestinal: Negative for abdominal distention,  abdominal pain, constipation, diarrhea, nausea and vomiting.  Endocrine: Negative for cold intolerance, heat intolerance, polydipsia, polyphagia and polyuria.  Genitourinary: Negative for decreased urine volume, difficulty urinating, dysuria and flank pain.       Hx BPH   Musculoskeletal: Positive for arthralgias and gait problem.       Right shoulder pain   Skin: Negative for color change, pallor, rash and wound.  Neurological: Negative for dizziness, light-headedness and headaches.       Weakness on legs  Hematological: Does not bruise/bleed easily.  Psychiatric/Behavioral: Positive for confusion. Negative for  agitation and sleep disturbance. The patient is not nervous/anxious.     Immunization History  Administered Date(s) Administered  . Fluad Quad(high Dose 65+) 04/12/2019  . Influenza, High Dose Seasonal PF 06/22/2013, 05/30/2015  . Influenza,inj,Quad PF,6+ Mos 04/19/2014, 04/02/2016, 04/01/2017, 04/26/2018  . Pneumococcal Conjugate-13 07/25/2014  . Pneumococcal Polysaccharide-23 08/03/2011, 06/22/2013  . Td 10/22/2013  . Tdap 07/30/2015  . Zoster Recombinat (Shingrix) 05/20/2017, 07/20/2017   Pertinent  Health Maintenance Due  Topic Date Due  . OPHTHALMOLOGY EXAM  06/14/2018  . FOOT EXAM  10/05/2018  . HEMOGLOBIN A1C  11/30/2019  . INFLUENZA VACCINE  Completed  . PNA vac Low Risk Adult  Completed   Fall Risk  06/07/2019 06/01/2019 04/11/2019 01/22/2019 01/02/2019  Falls in the past year? 1 1 0 1 0  Number falls in past yr: 1 1 0 0 0  Injury with Fall? 1 1 0 0 0  Comment - - - - -  Risk Factor Category  - - - - -   There were no vitals filed for this visit. There is no height or weight on file to calculate BMI. Physical Exam  Unable to complete on telephone visit.   Labs reviewed: Recent Labs    06/04/19 0541 06/05/19 0305 06/09/19 1549  NA 142 137 136  K 4.3 4.6 4.8  CL 103 102 98  CO2 30 26 29   GLUCOSE 119* 259* 242*  BUN 29* 30* 34*  CREATININE 1.65* 1.48* 1.53*  CALCIUM 8.7* 8.6* 9.1   Recent Labs    05/28/19 0433 06/01/19 1921 06/09/19 1549  AST 41 166* 38  ALT 29 67* 38  ALKPHOS 61 50 51  BILITOT 0.5 1.7* 0.8  PROT 6.5 6.6 6.2*  ALBUMIN 3.8 3.6 2.9*   Recent Labs    05/28/19 0600 06/01/19 1921  06/04/19 0541 06/05/19 0305 06/09/19 1549  WBC 4.0 8.1   < > 5.7 5.9 5.8  NEUTROABS 2.2 5.0  --   --   --  3.5  HGB 10.4* 11.7*   < > 10.3* 9.1* 10.1*  HCT 33.3* 35.5*   < > 31.4* 28.9* 33.0*  MCV 97.9 94.2   < > 94.6 95.4 100.0  PLT 173 PLATELET CLUMPS NOTED ON SMEAR, UNABLE TO ESTIMATE   < > 196 184 184   < > = values in this interval not displayed.    Lab Results  Component Value Date   TSH 1.527 06/01/2019   Lab Results  Component Value Date   HGBA1C 6.3 (H) 06/02/2019   Lab Results  Component Value Date   CHOL 134 06/02/2019   HDL 48 06/02/2019   LDLCALC 74 06/02/2019   LDLDIRECT 92.0 11/24/2015   TRIG 61 06/02/2019   CHOLHDL 2.8 06/02/2019    Significant Diagnostic Results in last 30 days:  Ct Angio Head W Or Wo Contrast  Result Date: 06/01/2019 CLINICAL DATA:  Progressive confusion over the  last week. Right-sided weakness for 4 days. EXAM: CT ANGIOGRAPHY HEAD AND NECK TECHNIQUE: Multidetector CT imaging of the head and neck was performed using the standard protocol during bolus administration of intravenous contrast. Multiplanar CT image reconstructions and MIPs were obtained to evaluate the vascular anatomy. Carotid stenosis measurements (when applicable) are obtained utilizing NASCET criteria, using the distal internal carotid diameter as the denominator. CONTRAST:  7mL OMNIPAQUE IOHEXOL 350 MG/ML SOLN COMPARISON:  CT head without contrast 06/01/2019. CT chest 01/18/2019 FINDINGS: CTA NECK FINDINGS Aortic arch: Extensive atherosclerotic calcifications are present in the aortic arch and proximal descending thoracic aorta. There is no aneurysm. Calcifications are greatest at the origin of the left subclavian artery. No significant stenosis is present in the great vessel origins. Right carotid system: The right common carotid artery is within normal limits. The bifurcation is unremarkable. The cervical right ICA is normal. Left carotid system: The left common carotid artery is within normal limits. Atherosclerotic calcifications are present at the bifurcation without a significant stenosis. The more distal cervical left ICA is otherwise normal. Vertebral arteries: The vertebral arteries are codominant. Both vertebral arteries originate from the subclavian arteries. Atherosclerotic calcifications are present at the origin of the right  vertebral artery with a 50% stenosis. There is no significant stenosis of either vertebral artery in the neck otherwise. Skeleton: Extensive degenerative changes are present within the cervical spine. Vertebral body heights are maintained. Calcification posterior longitudinal ligament at C3-4 and C4-5 contributes to moderate central canal stenosis at these levels. Multilevel foraminal disease is due to uncovertebral disease. No focal lytic or blastic lesions are present. The patient is edentulous. Other neck: The salivary glands and ducts. Are within No significant cervical adenopathy normal limits bilaterally is present. No focal mucosal or submucosal lesions are present. Upper chest: Centrilobular emphysematous changes are present. Mild dependent atelectasis is noted. Right inferior pulmonary embolus is noted. There is embolus extending into the left lower lobe pulmonary embolus is well. These are incompletely imaged. Right infrahilar soft tissue mass measures 2.2 x 1.4 cm. Right infrahilar adenopathy is present as well. 5 mm nodule is present posteriorly in the right upper lobe. Other smaller nodules are present in the right upper lobe as well. Review of the MIP images confirms the above findings CTA HEAD FINDINGS Anterior circulation: Atherosclerotic calcifications are present within the cavernous internal carotid arteries bilaterally without a significant stenosis relative to the more distal vessel. ICA termini are normal. The A1 and M1 segments are normal. The anterior communicating artery is patent. MCA bifurcations are intact. ACA and MCA branch vessels are normal. Posterior circulation: The vertebral arteries are codominant. PICA origins are visualized and normal. Vertebrobasilar junction is normal. Both posterior cerebral arteries originate from basilar tip. The PCA branch vessels are within normal limits. Venous sinuses: The dural sinuses are patent. The straight sinus deep cerebral veins are intact.  Cortical veins are unremarkable. Anatomic variants: None Review of the MIP images confirms the above findings IMPRESSION: 1. Bilateral lower lobe pulmonary emboli. 2. 50% stenosis at the origin of the right vertebral artery. 3. Atherosclerotic changes at the carotid bifurcations bilaterally without significant stenosis. 4. No significant proximal stenosis, aneurysm, or branch vessel occlusion within the Circle of Willis. 5. Extensive atherosclerotic changes within the aortic arch and proximal descending thoracic aorta without aneurysm. 6. Multilevel spondylosis of the cervical spine with a calcified posterior longitudinal ligament at C3-4 and C4-5 resulting in moderate central canal stenosis at these levels. Electronically Signed   By: Harrell Gave  Mattern M.D.   On: 06/01/2019 21:00   Dg Shoulder Right  Result Date: 06/09/2019 CLINICAL DATA:  Suspected fall EXAM: RIGHT SHOULDER - 2+ VIEW COMPARISON:  None. FINDINGS: No fracture or dislocation of the right shoulder. There is moderate glenohumeral and acromioclavicular arthrosis with probable loose bodies in the glenohumeral joint space. Partially imaged right chest is unremarkable. IMPRESSION: No fracture or dislocation of the right shoulder. There is moderate glenohumeral and acromioclavicular arthrosis with probable loose bodies in the glenohumeral joint space. Electronically Signed   By: Eddie Candle M.D.   On: 06/09/2019 17:43   Dg Elbow Complete Right  Result Date: 06/09/2019 CLINICAL DATA:  Suspected fall EXAM: RIGHT ELBOW - COMPLETE 3+ VIEW COMPARISON:  None. FINDINGS: No definite fracture or dislocation of the right elbow. There may be a very subtle fracture fragment adjacent to the right radial head and a small elbow joint effusion. There is enthesopathic change and heterotopic ossification about the olecranon. Soft tissues are unremarkable. IMPRESSION: No definite fracture or dislocation of the right elbow. There may be a very subtle fracture  fragment adjacent to the right radial head and a small elbow joint effusion. Electronically Signed   By: Eddie Candle M.D.   On: 06/09/2019 17:48   Ct Head Wo Contrast  Result Date: 06/01/2019 CLINICAL DATA:  Focal neuro deficit for greater than 6 hours, increasing confusion and combativeness for 1 week EXAM: CT HEAD WITHOUT CONTRAST TECHNIQUE: Contiguous axial images were obtained from the base of the skull through the vertex without intravenous contrast. COMPARISON:  CT 04/06/2017 FINDINGS: Brain: Stable regions of gliosis in the right centrum semiovale, left frontal pole and left occipital lobe compatible with remote infarct. No evidence of acute infarction, hemorrhage, or hydrocephalus. Remote right parietal hygroma/chronic subdural has since resolved from comparison exam. No visible extra-axial collection or mass lesion/mass effect. Symmetric prominence of the ventricles, cisterns and sulci compatible with parenchymal volume loss. Patchy areas of white matter hypoattenuation are most compatible with chronic microvascular angiopathy. Vascular: Atherosclerotic calcification of the carotid siphons and intradural vertebral arteries. No hyperdense vessel. Skull: No calvarial fracture or suspicious osseous lesion. No scalp swelling or hematoma. Sinuses/Orbits: Mural thickening in the anterior ethmoids and right maxillary sinus. No air-fluid levels. Orbital structures are unremarkable aside from prior lens extractions. Other: Edentulous with mandibular prognathism temporomandibular joints remain normally aligned. IMPRESSION: 1. No acute intracranial abnormality. If persisting concern for infarct, MRI is more sensitive and specific. 2. Stable remote infarcts in the right and left frontal and left occipital lobes. 3. Remote right parietal hygroma/chronic subdural has since resolved from comparison exam. 4. Chronic microvascular ischemic changes and parenchymal volume loss. Electronically Signed   By: Lovena Le  M.D.   On: 06/01/2019 19:55   Ct Angio Neck W And/or Wo Contrast  Result Date: 06/01/2019 CLINICAL DATA:  Progressive confusion over the last week. Right-sided weakness for 4 days. EXAM: CT ANGIOGRAPHY HEAD AND NECK TECHNIQUE: Multidetector CT imaging of the head and neck was performed using the standard protocol during bolus administration of intravenous contrast. Multiplanar CT image reconstructions and MIPs were obtained to evaluate the vascular anatomy. Carotid stenosis measurements (when applicable) are obtained utilizing NASCET criteria, using the distal internal carotid diameter as the denominator. CONTRAST:  62mL OMNIPAQUE IOHEXOL 350 MG/ML SOLN COMPARISON:  CT head without contrast 06/01/2019. CT chest 01/18/2019 FINDINGS: CTA NECK FINDINGS Aortic arch: Extensive atherosclerotic calcifications are present in the aortic arch and proximal descending thoracic aorta. There is no aneurysm. Calcifications  are greatest at the origin of the left subclavian artery. No significant stenosis is present in the great vessel origins. Right carotid system: The right common carotid artery is within normal limits. The bifurcation is unremarkable. The cervical right ICA is normal. Left carotid system: The left common carotid artery is within normal limits. Atherosclerotic calcifications are present at the bifurcation without a significant stenosis. The more distal cervical left ICA is otherwise normal. Vertebral arteries: The vertebral arteries are codominant. Both vertebral arteries originate from the subclavian arteries. Atherosclerotic calcifications are present at the origin of the right vertebral artery with a 50% stenosis. There is no significant stenosis of either vertebral artery in the neck otherwise. Skeleton: Extensive degenerative changes are present within the cervical spine. Vertebral body heights are maintained. Calcification posterior longitudinal ligament at C3-4 and C4-5 contributes to moderate central  canal stenosis at these levels. Multilevel foraminal disease is due to uncovertebral disease. No focal lytic or blastic lesions are present. The patient is edentulous. Other neck: The salivary glands and ducts. Are within No significant cervical adenopathy normal limits bilaterally is present. No focal mucosal or submucosal lesions are present. Upper chest: Centrilobular emphysematous changes are present. Mild dependent atelectasis is noted. Right inferior pulmonary embolus is noted. There is embolus extending into the left lower lobe pulmonary embolus is well. These are incompletely imaged. Right infrahilar soft tissue mass measures 2.2 x 1.4 cm. Right infrahilar adenopathy is present as well. 5 mm nodule is present posteriorly in the right upper lobe. Other smaller nodules are present in the right upper lobe as well. Review of the MIP images confirms the above findings CTA HEAD FINDINGS Anterior circulation: Atherosclerotic calcifications are present within the cavernous internal carotid arteries bilaterally without a significant stenosis relative to the more distal vessel. ICA termini are normal. The A1 and M1 segments are normal. The anterior communicating artery is patent. MCA bifurcations are intact. ACA and MCA branch vessels are normal. Posterior circulation: The vertebral arteries are codominant. PICA origins are visualized and normal. Vertebrobasilar junction is normal. Both posterior cerebral arteries originate from basilar tip. The PCA branch vessels are within normal limits. Venous sinuses: The dural sinuses are patent. The straight sinus deep cerebral veins are intact. Cortical veins are unremarkable. Anatomic variants: None Review of the MIP images confirms the above findings IMPRESSION: 1. Bilateral lower lobe pulmonary emboli. 2. 50% stenosis at the origin of the right vertebral artery. 3. Atherosclerotic changes at the carotid bifurcations bilaterally without significant stenosis. 4. No significant  proximal stenosis, aneurysm, or branch vessel occlusion within the Circle of Willis. 5. Extensive atherosclerotic changes within the aortic arch and proximal descending thoracic aorta without aneurysm. 6. Multilevel spondylosis of the cervical spine with a calcified posterior longitudinal ligament at C3-4 and C4-5 resulting in moderate central canal stenosis at these levels. Electronically Signed   By: San Morelle M.D.   On: 06/01/2019 21:00   Ct Chest Wo Contrast  Result Date: 06/04/2019 CLINICAL DATA:  Adenopathy EXAM: CT CHEST WITHOUT CONTRAST TECHNIQUE: Multidetector CT imaging of the chest was performed following the standard protocol without IV contrast. COMPARISON:  January 18, 2019 CT of the chest FINDINGS: Cardiovascular: The heart size is normal. Coronary artery calcifications are noted. There are atherosclerotic calcifications of the thoracic aorta. There is no significant pericardial effusion. Mediastinum/Nodes: --No mediastinal or hilar lymphadenopathy. --No axillary lymphadenopathy. --No supraclavicular lymphadenopathy. --Normal thyroid gland. --The esophagus is unremarkable Lungs/Pleura: Again identified are scattered bilateral pleural based pulmonary nodules. The overall  distribution in size of these nodules is similar to prior CT in June of 2020. There is a small right-sided pleural effusion, significantly decreased in size from prior study in June. There is some mucous plugging and bronchial wall thickening at the lung bases, right worse than left Upper Abdomen: No acute abnormality. Musculoskeletal: No chest wall abnormality. No acute or significant osseous findings. There is asymmetric gynecomastia involving the right chest wall. Review of the MIP images confirms the above findings. IMPRESSION: 1. No pathologically enlarged lymph nodes. 2. Again noted are multiple pleural based nodules throughout the right lung, relatively stable from prior CT in June of 2020. However, these have  progressed since CT in 2018. Differential considerations include noncalcified pleural based plaques, pleural metastases, or primary pleural neoplasm such as mesothelioma. 3. Small right-sided pleural effusion, significantly improved from prior study. Aortic Atherosclerosis (ICD10-I70.0). Electronically Signed   By: Constance Holster M.D.   On: 06/04/2019 05:27   Mr Brain Wo Contrast  Result Date: 06/04/2019 CLINICAL DATA:  New onset right-sided weakness in the setting of pulmonary embolism EXAM: MRI HEAD WITHOUT CONTRAST TECHNIQUE: Multiplanar, multiecho pulse sequences of the brain and surrounding structures were obtained without intravenous contrast. COMPARISON:  Two days ago FINDINGS: Brain: No acute infarction, hemorrhage, hydrocephalus, extra-axial collection or mass lesion. Moderate area of encephalomalacia in the anterior left frontal lobe, best attributed to chronic infarct. Minimal small vessel ischemic change in the white matter and pons for age. Cerebral volume loss without specific pattern, in keeping with age. Vascular: Normal flow voids Skull and upper cervical spine: Mild right-sided scalp swelling. Normal marrow signal Sinuses/Orbits: Opacified right anterior ethmoid air cells with some volume loss. Bilateral cataract resection IMPRESSION: 1. No acute finding including infarct. 2. Aging brain with remote left frontal infarct. Electronically Signed   By: Monte Fantasia M.D.   On: 06/04/2019 05:19   Mr Brain Wo Contrast  Result Date: 06/02/2019 CLINICAL DATA:  Focal neuro deficit, greater than 6 hours, stroke suspected. Multiple comorbidities. Patient started on Haldol a few days ago. New onset right upper and lower extremity weakness beginning yesterday. EXAM: MRI HEAD WITHOUT CONTRAST TECHNIQUE: Multiplanar, multiecho pulse sequences of the brain and surrounding structures were obtained without intravenous contrast. COMPARISON:  CT head without contrast and CTA head and neck 06/01/2019  FINDINGS: Brain: The diffusion-weighted images demonstrate no acute or subacute infarction. Remote anterior left frontal lobe infarct is again noted. There is associated volume loss. Confluent periventricular white matter changes are otherwise within normal limits for age. Dilated perivascular spaces are present throughout the basal ganglia. White matter changes extend into the brainstem. The cerebellum is unremarkable. No acute hemorrhage or mass lesion is present. There is no mass effect. The ventricles are proportionate to the degree of atrophy. No significant extraaxial fluid collection is present. Vascular: Flow is present in the major intracranial arteries. Skull and upper cervical spine: Mild degenerative changes are present in the upper cervical spine. Craniocervical junction is normal. Midline structures are unremarkable. Sinuses/Orbits: Anterior right ethmoid air cells are opacified. Frontal sinuses are not pneumatized. Mild mucosal thickening is present in the right maxillary sinus. No fluid levels are present. Minimal fluid is present in the posterior mastoid air cells bilaterally. No obstructing lesions are evident. Bilateral lens replacements are present. Globes and orbits are within normal limits. IMPRESSION: 1. No acute intracranial abnormality or significant interval change. 2. Remote anterior left frontal lobe infarct. 3. Mild atrophy and white matter disease is otherwise within normal limits for  age. Electronically Signed   By: San Morelle M.D.   On: 06/02/2019 10:06   Mr Cervical Spine Wo Contrast  Result Date: 06/04/2019 CLINICAL DATA:  Acute or progressive myelopathy EXAM: MRI CERVICAL SPINE WITHOUT CONTRAST TECHNIQUE: Multiplanar, multisequence MR imaging of the cervical spine was performed. No intravenous contrast was administered. COMPARISON:  None. FINDINGS: Alignment: Straightening of the cervical spine. No traumatic malalignment Vertebrae: No occult fracture, discitis, or  aggressive bone lesion. Cord: Degenerative impingement at C3-4 and C5-6, but no detected cord signal abnormality. Motion is a limiting factor Posterior Fossa, vertebral arteries, paraspinal tissues: Thin layer ofprevertebral edema. Interspinous STIR hyperintensity at C6-7 and to a lesser extent at C5-6, without major ligamentous disruption or fracture on preceding CTA. Disc levels: C2-3: Mild asymmetric left facet spurring. C3-4: Advanced disc narrowing with downward pointing disc protrusion flattening the ventral cord. Biforaminal impingement, greater on the left, due to disc height loss and uncovertebral ridging with bulging. C4-5: Advanced disc narrowing with endplate and uncovertebral ridging and disc bulging. Spinal stenosis with disc osteophyte complex contacting the ventral cord. Biforaminal impingement greater on the left C5-6: Disc narrowing and bulging with superimposed central and right foraminal protrusion. There is spinal stenosis with ventral cord flattening. Advanced right foraminal impingement C6-7: Disc narrowing and bulging with a left paracentral protrusion and buttressing osteophytes. Uncovertebral spurring on both sides. Spinal stenosis with disc osteophyte complex contacting the ventral cord. The left foraminal protrusion deforms the C7 nerve root on sagittal images. There is also right foraminal impingement C7-T1:Disc narrowing and bulging with a left foraminal protrusion causing advanced foraminal impingement. IMPRESSION: 1. Mild prevertebral and interspinous fluid signal suggesting strain. No occult fracture or visible cord contusion. 2. Diffuse degenerative disease with variable foraminal impingement from C3-4 to C7-T1, as above. 3. Degenerative ventral cord flattening at C3-4 and C5-6. Electronically Signed   By: Monte Fantasia M.D.   On: 06/04/2019 05:28   Dg Shoulder Right Port  Result Date: 06/03/2019 CLINICAL DATA:  RIGHT shoulder pain, no known injury EXAM: PORTABLE RIGHT  SHOULDER COMPARISON:  None FINDINGS: Osseous demineralization. Degenerative changes at [facility] joint with inferior acromial and clavicular spur formation. No acute fracture, dislocation, or bone destruction. Minimal chondrocalcinosis at shoulder joint. Visualized ribs intact. IMPRESSION: AC joint degenerative changes with inferior acromial and clavicular spur formation which may predispose the patient to rotator cuff pathology. Chondrocalcinosis at shoulder joint question CPPD. No acute abnormalities. Electronically Signed   By: Lavonia Dana M.D.   On: 06/03/2019 18:10   Dg Hip Unilat W Or Wo Pelvis 2-3 Views Right  Result Date: 06/09/2019 CLINICAL DATA:  Pain, suspected fall EXAM: DG HIP (WITH OR WITHOUT PELVIS) 2-3V RIGHT COMPARISON:  Radiograph January 18, 2019 FINDINGS: No evidence of hip fracture or dislocation. Bones of the pelvis are congruent. Mild degenerative changes noted in the SI joints and both hips. Sclerotic changes of the femoral heads likely reflecting prior AVN. Additional discogenic changes are present in the included spine. Portions of the sacrum are obscured by overlying bowel gas. Phleboliths noted in the pelvis. Bowel gas pattern is normal. Vascular calcium noted in the medial thighs. IMPRESSION: No acute osseous abnormality. No evidence of hip fracture or dislocation. Chronic sclerotic changes of the femoral heads likely reflecting a vascular necrosis. Electronically Signed   By: Lovena Le M.D.   On: 06/09/2019 17:48   Vas [place] Lower Extremity Venous (dvt)  Result Date: 06/03/2019  Lower Venous Study Indications: Pulmonary embolism.  Comparison Study: No prior  study on file Performing Technologist: Sharion Dove RVS  Examination Guidelines: A complete evaluation includes B-mode imaging, spectral Doppler, color Doppler, and power Doppler as needed of all accessible portions of each vessel. Bilateral testing is considered an integral part of a complete examination. Limited examinations for  reoccurring indications may be performed as noted.  +---------+---------------+---------+-----------+----------+--------------+ RIGHT    CompressibilityPhasicitySpontaneityPropertiesThrombus Aging +---------+---------------+---------+-----------+----------+--------------+ CFV      Full           Yes      Yes                                 +---------+---------------+---------+-----------+----------+--------------+ SFJ      Full                                                        +---------+---------------+---------+-----------+----------+--------------+ FV Prox  Full                                                        +---------+---------------+---------+-----------+----------+--------------+ FV Mid   Full                                                        +---------+---------------+---------+-----------+----------+--------------+ FV DistalFull                                                        +---------+---------------+---------+-----------+----------+--------------+ PFV      Full                                                        +---------+---------------+---------+-----------+----------+--------------+ POP      Full           Yes      Yes                                 +---------+---------------+---------+-----------+----------+--------------+ PTV      None           No       No                                  +---------+---------------+---------+-----------+----------+--------------+ PERO     None           No       No                                  +---------+---------------+---------+-----------+----------+--------------+   +---------+---------------+---------+-----------+----------+--------------+  LEFT     CompressibilityPhasicitySpontaneityPropertiesThrombus Aging +---------+---------------+---------+-----------+----------+--------------+ CFV      Full           Yes      Yes                                  +---------+---------------+---------+-----------+----------+--------------+ SFJ      Full                                                        +---------+---------------+---------+-----------+----------+--------------+ FV Prox  Full                                                        +---------+---------------+---------+-----------+----------+--------------+ FV Mid   Full                                                        +---------+---------------+---------+-----------+----------+--------------+ FV DistalFull                                                        +---------+---------------+---------+-----------+----------+--------------+ PFV      Full                                                        +---------+---------------+---------+-----------+----------+--------------+ POP      Full           Yes      Yes                                 +---------+---------------+---------+-----------+----------+--------------+ PTV      None                                         Acute          +---------+---------------+---------+-----------+----------+--------------+ PERO     None                                         Acute          +---------+---------------+---------+-----------+----------+--------------+     *See table(s) above for measurements and observations. Electronically signed by Deitra Mayo MD on 06/03/2019 at 2:46:05 PM.    Final    Ue Venous Duplex (mc & Wl 7 Am - 7 Pm)  Result Date: 06/10/2019 UPPER VENOUS STUDY  Indications: Swelling Other Indications: Patient on Eliquis. Limitations: Patient's  inability to turn arm. Comparison Study: No prior study on file Performing Technologist: Sharion Dove RVS  Examination Guidelines: A complete evaluation includes B-mode imaging, spectral Doppler, color Doppler, and power Doppler as needed of all accessible portions of each vessel. Bilateral testing is considered an integral part  of a complete examination. Limited examinations for reoccurring indications may be performed as noted.  Right Findings: +----------+------------+---------+-----------+----------+--------------+ RIGHT     CompressiblePhasicitySpontaneousProperties   Summary     +----------+------------+---------+-----------+----------+--------------+ IJV           Full                                                 +----------+------------+---------+-----------+----------+--------------+ Subclavian               Yes       Yes                             +----------+------------+---------+-----------+----------+--------------+ Axillary                 Yes       Yes                             +----------+------------+---------+-----------+----------+--------------+ Brachial      Full       Yes       Yes                             +----------+------------+---------+-----------+----------+--------------+ Radial        Full                                                 +----------+------------+---------+-----------+----------+--------------+ Ulnar                                               Not visualized +----------+------------+---------+-----------+----------+--------------+ Cephalic      Full                                                 +----------+------------+---------+-----------+----------+--------------+ Basilic       Full                                                 +----------+------------+---------+-----------+----------+--------------+  Left Findings: +----------+------------+---------+-----------+----------+-------+ LEFT      CompressiblePhasicitySpontaneousPropertiesSummary +----------+------------+---------+-----------+----------+-------+ Subclavian               Yes       Yes                      +----------+------------+---------+-----------+----------+-------+  Summary:  Right: No evidence of deep vein thrombosis in the upper  extremity. No evidence of superficial vein thrombosis in the upper  extremity.  Left: No evidence of thrombosis in the subclavian.  *See table(s) above for measurements and observations.  Diagnosing physician: Curt Jews MD Electronically signed by Curt Jews MD on 06/10/2019 at 9:02:32 AM.    Final     Assessment/Plan 1. Acute right-sided weakness Requires assistance with transfers and Activity of daily living. - Ambulatory referral to [place] - For home use only DME Hospital bed he requires a semi [facility] bed  with  rails to allow repositioning in ways not feasible with a normal bed.  2. Unsteady gait Previously was able to walk with a walker but now requires assistance with transfer.Daughter unable to transfer by her self. - Ambulatory referral to [place] Nurse assistance to assist with Activity of daily living.  3. Other pulmonary embolism without acute cor pulmonale, unspecified chronicity ([place]) Newly diagnosed during recent hospitalization.treated with Heparin drip then switched to EliQuis 5 mg tablet twice daily. No signs of bleeding reported.  - For home use only DME Hospital bed  4. Bilateral lower extremity edema Continue to elevate legs. - For home use only DME Hospital bed to allow change in position.   5. Chronic diastolic CHF (congestive heart failure) (HCC) No shortness of breath,cough or wheezing reported.  - For home use only DME Hospital bed  6. Debility Has declined requires assistance with transfers and activity of daily living.could benefit from higher level of care in a long term facility but daughter states process has taken long with the [facility] . - Ambulatory referral to [facility] for CNA to assist with Activity of daily living.  - For home use only DME Hospital bed  7. Type 2 diabetes, controlled, with peripheral circulatory disorder [facility]) Daughter reports CBG< 200 Lantus 7 units was started at the hospital but daughter not familiar with how to  operate Lantus pen.Has been giving Tujeo instead.will have [facility] Nurse to educate daughter how to administer Lantus then discontinue Tujeo.  - Ambulatory referral to [facility] Nurse for medication and Diabetic education.  On ARB,ASA and Statin.   8. Moderate dementia with behavioral disturbance ([place]) Continue on Namzaric,Haldol and supportive care.   9.BPH  No urine retention.continue on tamsulosin 0.4 mg capsule daily.  10.Anxiety and Depression Stable.continue on Escitalopram.     Family/ staff Communication: Reviewed plan of care with patient and Daughter.   Labs/tests ordered: None   Spent 38 minutes of non-face to face with patient    Sandrea Hughs, NP

## 2019-06-09 ENCOUNTER — Emergency Department (HOSPITAL_COMMUNITY)
Admission: EM | Admit: 2019-06-09 | Discharge: 2019-06-13 | Disposition: A | Discharge status: Home / Self Care | Payer: Medicare HMO | Attending: Emergency Medicine | Admitting: Emergency Medicine

## 2019-06-09 ENCOUNTER — Encounter (HOSPITAL_COMMUNITY): Payer: Self-pay

## 2019-06-09 ENCOUNTER — Emergency Department (HOSPITAL_COMMUNITY): Payer: Medicare HMO

## 2019-06-09 ENCOUNTER — Other Ambulatory Visit: Payer: Self-pay

## 2019-06-09 ENCOUNTER — Emergency Department (HOSPITAL_BASED_OUTPATIENT_CLINIC_OR_DEPARTMENT_OTHER): Payer: Medicare HMO

## 2019-06-09 DIAGNOSIS — Z79899 Other long term (current) drug therapy: Secondary | ICD-10-CM | POA: Diagnosis not present

## 2019-06-09 DIAGNOSIS — Z7901 Long term (current) use of anticoagulants: Secondary | ICD-10-CM | POA: Insufficient documentation

## 2019-06-09 DIAGNOSIS — Z8673 Personal history of transient ischemic attack (TIA), and cerebral infarction without residual deficits: Secondary | ICD-10-CM | POA: Insufficient documentation

## 2019-06-09 DIAGNOSIS — F329 Major depressive disorder, single episode, unspecified: Secondary | ICD-10-CM

## 2019-06-09 DIAGNOSIS — M79601 Pain in right arm: Secondary | ICD-10-CM | POA: Diagnosis not present

## 2019-06-09 DIAGNOSIS — I2510 Atherosclerotic heart disease of native coronary artery without angina pectoris: Secondary | ICD-10-CM | POA: Diagnosis not present

## 2019-06-09 DIAGNOSIS — Z794 Long term (current) use of insulin: Secondary | ICD-10-CM | POA: Insufficient documentation

## 2019-06-09 DIAGNOSIS — R52 Pain, unspecified: Secondary | ICD-10-CM | POA: Diagnosis not present

## 2019-06-09 DIAGNOSIS — M25551 Pain in right hip: Secondary | ICD-10-CM | POA: Diagnosis not present

## 2019-06-09 DIAGNOSIS — W19XXXA Unspecified fall, initial encounter: Secondary | ICD-10-CM | POA: Diagnosis not present

## 2019-06-09 DIAGNOSIS — R5381 Other malaise: Secondary | ICD-10-CM | POA: Insufficient documentation

## 2019-06-09 DIAGNOSIS — M79603 Pain in arm, unspecified: Secondary | ICD-10-CM | POA: Diagnosis not present

## 2019-06-09 DIAGNOSIS — I10 Essential (primary) hypertension: Secondary | ICD-10-CM | POA: Diagnosis not present

## 2019-06-09 DIAGNOSIS — I130 Hypertensive heart and chronic kidney disease with heart failure and stage 1 through stage 4 chronic kidney disease, or unspecified chronic kidney disease: Secondary | ICD-10-CM | POA: Insufficient documentation

## 2019-06-09 DIAGNOSIS — F0391 Unspecified dementia with behavioral disturbance: Secondary | ICD-10-CM

## 2019-06-09 DIAGNOSIS — S4991XA Unspecified injury of right shoulder and upper arm, initial encounter: Secondary | ICD-10-CM | POA: Diagnosis not present

## 2019-06-09 DIAGNOSIS — N183 Chronic kidney disease, stage 3 unspecified: Secondary | ICD-10-CM | POA: Insufficient documentation

## 2019-06-09 DIAGNOSIS — Z87891 Personal history of nicotine dependence: Secondary | ICD-10-CM | POA: Diagnosis not present

## 2019-06-09 DIAGNOSIS — F03B18 Unspecified dementia, moderate, with other behavioral disturbance: Secondary | ICD-10-CM

## 2019-06-09 DIAGNOSIS — I509 Heart failure, unspecified: Secondary | ICD-10-CM | POA: Insufficient documentation

## 2019-06-09 DIAGNOSIS — F32A Depression, unspecified: Secondary | ICD-10-CM

## 2019-06-09 DIAGNOSIS — E1122 Type 2 diabetes mellitus with diabetic chronic kidney disease: Secondary | ICD-10-CM | POA: Diagnosis not present

## 2019-06-09 DIAGNOSIS — M7989 Other specified soft tissue disorders: Secondary | ICD-10-CM | POA: Diagnosis not present

## 2019-06-09 DIAGNOSIS — M25421 Effusion, right elbow: Secondary | ICD-10-CM | POA: Diagnosis not present

## 2019-06-09 DIAGNOSIS — F0390 Unspecified dementia without behavioral disturbance: Secondary | ICD-10-CM | POA: Insufficient documentation

## 2019-06-09 DIAGNOSIS — Z20828 Contact with and (suspected) exposure to other viral communicable diseases: Secondary | ICD-10-CM | POA: Insufficient documentation

## 2019-06-09 HISTORY — DX: Unspecified dementia, unspecified severity, with other behavioral disturbance: F03.918

## 2019-06-09 HISTORY — DX: Unspecified dementia with behavioral disturbance: F03.91

## 2019-06-09 LAB — CBC WITH DIFFERENTIAL/PLATELET
Abs Immature Granulocytes: 0.01 10*3/uL (ref 0.00–0.07)
Basophils Absolute: 0 10*3/uL (ref 0.0–0.1)
Basophils Relative: 1 %
Eosinophils Absolute: 0.2 10*3/uL (ref 0.0–0.5)
Eosinophils Relative: 3 %
HCT: 33 % — ABNORMAL LOW (ref 39.0–52.0)
Hemoglobin: 10.1 g/dL — ABNORMAL LOW (ref 13.0–17.0)
Immature Granulocytes: 0 %
Lymphocytes Relative: 27 %
Lymphs Abs: 1.6 10*3/uL (ref 0.7–4.0)
MCH: 30.6 pg (ref 26.0–34.0)
MCHC: 30.6 g/dL (ref 30.0–36.0)
MCV: 100 fL (ref 80.0–100.0)
Monocytes Absolute: 0.6 10*3/uL (ref 0.1–1.0)
Monocytes Relative: 10 %
Neutro Abs: 3.5 10*3/uL (ref 1.7–7.7)
Neutrophils Relative %: 59 %
Platelets: 184 10*3/uL (ref 150–400)
RBC: 3.3 MIL/uL — ABNORMAL LOW (ref 4.22–5.81)
RDW: 13.2 % (ref 11.5–15.5)
WBC: 5.8 10*3/uL (ref 4.0–10.5)
nRBC: 0 % (ref 0.0–0.2)

## 2019-06-09 LAB — COMPREHENSIVE METABOLIC PANEL
ALT: 38 U/L (ref 0–44)
AST: 38 U/L (ref 15–41)
Albumin: 2.9 g/dL — ABNORMAL LOW (ref 3.5–5.0)
Alkaline Phosphatase: 51 U/L (ref 38–126)
Anion gap: 9 (ref 5–15)
BUN: 34 mg/dL — ABNORMAL HIGH (ref 8–23)
CO2: 29 mmol/L (ref 22–32)
Calcium: 9.1 mg/dL (ref 8.9–10.3)
Chloride: 98 mmol/L (ref 98–111)
Creatinine, Ser: 1.53 mg/dL — ABNORMAL HIGH (ref 0.61–1.24)
GFR calc Af Amer: 44 mL/min — ABNORMAL LOW (ref >60)
GFR calc non Af Amer: 38 mL/min — ABNORMAL LOW (ref >60)
Glucose, Bld: 242 mg/dL — ABNORMAL HIGH (ref 70–99)
Potassium: 4.8 mmol/L (ref 3.5–5.1)
Sodium: 136 mmol/L (ref 135–145)
Total Bilirubin: 0.8 mg/dL (ref 0.3–1.2)
Total Protein: 6.2 g/dL — ABNORMAL LOW (ref 6.5–8.1)

## 2019-06-09 NOTE — ED Notes (Signed)
Patient given apple sauce, soup, and ginger ale.

## 2019-06-09 NOTE — Progress Notes (Signed)
VASCULAR LAB PRELIMINARY  PRELIMINARY  PRELIMINARY  PRELIMINARY  Right lower extremity venous duplex completed.    Preliminary report:  See CV proc for preliminary results.  Gave Dr. Billy Fischer results.  Burnie Therien, RVT 06/09/2019, 5:47 PM

## 2019-06-09 NOTE — ED Provider Notes (Signed)
[facility] DEPT Provider Note   CSN: [id number] Arrival date & time: 06/09/19  1510     History   Chief Complaint Chief Complaint  Patient presents with  . Right Arm Pain    HPI [name] is a 85 y.o. male.     HPI   85 year old male with history of coronary artery disease, CVA, insulin-dependent diabetes, hypertension, chronic kidney disease, chronic depression and dementia with behavioral disturbance, recent admission October 30 to November 3 for metabolic encephalopathy with persistent right-sided weakness for which she had CVA evaluation with MRI of the brain showing no acute findings, MRI of the cervical spine showing multilevel cervical spine disease and was evaluated by Dr. Christella Noa, incidental diagnosis of pulmonary emboli with bilateral lower extremity DVT started on Eliquis, who is recommended skilled nursing facility for physical debility and ambulatory dysfunction but this was declined by family, who presents now with concern for family desire for placement, with continued concern for right-sided pain.   In a lot of pain on right side, arm, leg began last Saturday AM [facility] Friday, didn't discover him until Saturday AM prior to his hospital admission. Didn't holler or say anything was down on the floor before he was in the hospital  Since he has been home has been trying to move him but he is in pain when she tries to move him.  Living at home with daughter.   Has been that way since the fall prior to admission.  His right arm and hand started swell during his admission.  Whenever daughter tries to move it he is yelling and upset.  Has been swollen since he has bene in the hospital.  Taking eliquis twice a day since discharge. No other falls.  Afraid to move him as unable to stand up on his own. He has just been staying in the same position for 4 days, she cannot get him up.     Past Medical History:  Diagnosis Date  . Arthritis   . CAD  (coronary artery disease)    s/p DES to LAD 06/2013  . Depression   . Dyslipidemia   . Glaucoma   . Hyperglycemia 04/17/2014  . Hyperlipidemia   . Hypertension   . Stroke ([place])   . Type 2 diabetes mellitus [facility])     Patient Active Problem List   Diagnosis Date Noted  . Pulmonary embolism ([place]) 06/01/2019  . Acute renal failure superimposed on stage 3 chronic kidney disease ([place]) 06/01/2019  . Chronic diastolic CHF (congestive heart failure) ([place]) 06/01/2019  . Pain due to onychomycosis of toenails of both feet 01/23/2019  . Bilateral lower extremity edema 07/05/2017  . Pulmonary nodules 04/13/2017  . Centrilobular emphysema ([place]) 04/13/2017  . Calculus of gallbladder without cholecystitis without obstruction 04/13/2017  . Chronic cough 04/13/2017  . Adenoma of left adrenal gland 04/13/2017  . Frequent falls 04/01/2017  . Type 2 diabetes mellitus with stage 3 chronic kidney disease, with long-term current use of insulin ([place]) 04/01/2017  . History of tobacco abuse 04/01/2017  . Unsteady gait 04/01/2017  . Dysarthria 04/01/2017  . History of stroke   . Hyperlipidemia   . Moderate dementia with behavioral disturbance ([place]) 09/19/2015  . Depression 09/19/2015  . Hypertensive heart disease 09/10/2015  . Type 2 diabetes, controlled, with peripheral circulatory disorder ([place])   . Essential hypertension   . Coronary artery disease involving native coronary artery of native heart with angina pectoris ([place])   . Arthritis   .  Glaucoma     Past Surgical History:  Procedure Laterality Date  . CARDIAC CATHETERIZATION  06/2013   DES to LAD by Dr Tamala Julian  . Right Cataract          Home Medications    Prior to Admission medications   Medication Sig Start Date End Date Taking? Authorizing Provider  albuterol (PROVENTIL HFA;VENTOLIN HFA) 108 (90 Base) MCG/ACT inhaler INHALE TWO PUFFS EVERY 4-6 HOURS AS NEEDED FOR DIFFICULTY BREATHING,WHEEZING OR COUGH Patient taking differently:  Inhale 2 puffs into the lungs every 4 (four) hours as needed for wheezing or shortness of breath. INHALE TWO PUFFS EVERY 4-6 HOURS AS NEEDED FOR DIFFICULTY BREATHING,WHEEZING OR COUGH 08/31/18  Yes Ngetich, Dinah C, NP  amLODipine (NORVASC) 5 MG tablet Take 5 mg by mouth daily.   Yes [provider]  apixaban (ELIQUIS) 5 MG TABS tablet Take 2 tablets (10mg ) twice daily for 7 days, then 1 tablet (5mg ) twice daily 06/04/19  Yes Kayleen Memos, DO  aspirin EC 81 MG tablet Take 1 tablet (81 mg total) by mouth daily. 06/22/13  Yes Rowe Clack, MD  escitalopram (LEXAPRO) 5 MG tablet TAKE 1 TABLET EVERY DAY Patient taking differently: Take 5 mg by mouth daily. TAKE 1 TABLET EVERY DAY 02/13/19  Yes Ngetich, Dinah C, NP  hydrochlorothiazide (HYDRODIURIL) 25 MG tablet Take 25 mg by mouth daily.   Yes [provider]  Insulin Glargine (TOUJEO SOLOSTAR [place]) Inject 65 Units into the skin at bedtime.   Yes [provider]  Loratadine 10 MG CAPS Take 1 capsule (10 mg total) by mouth daily. 02/13/19  Yes Ngetich, Dinah C, NP  losartan (COZAAR) 50 MG tablet Take 50 mg by mouth daily.   Yes [provider]  Multiple Vitamins-Minerals (SENTRY SENIOR) TABS Take 1 tablet by mouth daily.   Yes [provider]  NAMZARIC 28-10 MG CP24 TAKE 1 CAPSULE EVERY DAY Patient taking differently: Take 1 capsule by mouth daily.  02/13/19  Yes Ngetich, Dinah C, NP  naproxen sodium (ALEVE) 220 MG tablet Take 440 mg by mouth 2 (two) times daily as needed (headache/pain).   Yes [provider]  simvastatin (ZOCOR) 20 MG tablet Take 20 mg by mouth daily.   Yes [provider]  tamsulosin (FLOMAX) 0.4 MG CAPS capsule Take 0.4 mg by mouth daily after supper.   Yes [provider]  triamcinolone cream (KENALOG) 0.1 % APPLY 1 APPLICATION OF CREAM TWICE DAILY FOR 14 DAYS TO LOWER EXTRMITIES Patient taking differently: Apply 1 application topically 2 (two) times daily.  APPLY 1 APPLICATION OF CREAM TWICE DAILY FOR 14 DAYS TO LOWER EXTRMITIES 05/28/19  Yes Reed, Tiffany L, DO  Alcohol Swabs (B-D SINGLE USE SWABS REGULAR) PADS USE AS DIRECTED WITH CHECKING BLOOD SUGAR THREE TIMES DAILY 09/07/17   Gildardo Cranker, DO  Blood Glucose Calibration (ACCU-CHEK AVIVA) SOLN Use as Directed. Dx: E11.51 04/20/16   Gildardo Cranker, DO  glucose blood (ACCU-CHEK AVIVA) test strip Use to test blood sugar three times daily. Dx: E11.51 08/31/18   Ngetich, Dinah C, NP  haloperidol (HALDOL) 5 MG tablet Take 1 tablet (5 mg total) by mouth every 6 (six) hours as needed for agitation. Give at dinnertime and at bedtime Patient not taking: Reported on [id number]   Delora Fuel, MD  Insulin Glargine (LANTUS) 100 UNIT/ML Solostar Pen Inject 7 Units into the skin daily. Patient not taking: Reported on 06/09/2019 06/05/19   Kayleen Memos, DO  Insulin Pen  Needle (B-D UF III MINI PEN NEEDLES) 31G X 5 MM MISC USE AS DIRECTED. 08/31/18   Ngetich, Dinah C, NP  Lancet Devices (ACCU-CHEK SOFTCLIX) lancets Use as instructed with checking blood sugar three times daily. Dx: E11.51 04/01/17   Gildardo Cranker, DO  mineral oil-hydrophilic petrolatum (AQUAPHOR) ointment Apply topically 2 (two) times daily. To legs Patient not taking: Reported on 06/09/2019 04/11/19   Ngetich, Nelda Bucks, NP    Family History Family History  Problem Relation Age of Onset  . Arthritis Mother   . Breast cancer Mother   . Arthritis Father   . Dementia Father   . Diabetes Other   . Arthritis Other   . Colon cancer Other   . Stroke Other   . Diabetes type II Sister   . Suicidality Brother   . Diabetes type I Brother   . Diabetes type I Brother   . Diabetes type I Brother   . Diabetes type II Sister   . Breast cancer Daughter   . Emphysema Daughter     Social History Social History   Tobacco Use  . Smoking status: Former Smoker    Types: Cigarettes    Quit date: 08/02/1968    Years since quitting: 50.8  . Smokeless  tobacco: Never Used  Substance Use Topics  . Alcohol use: No    Alcohol/week: 0.0 standard drinks  . Drug use: No     Allergies   Lisinopril   Review of Systems Review of Systems  Unable to perform ROS: Dementia     Physical Exam Updated Vital Signs BP 124/67   Pulse 90   Temp 98.3 F (36.8 C) (Oral)   Resp (!) 21   SpO2 97%   Physical Exam Vitals signs and nursing note reviewed.  Constitutional:      General: He is not in acute distress.    Appearance: He is well-developed. He is not diaphoretic.  HENT:     Head: Normocephalic and atraumatic.  Eyes:     Conjunctiva/sclera: Conjunctivae normal.  Neck:     Musculoskeletal: Normal range of motion.  Cardiovascular:     Rate and Rhythm: Normal rate and regular rhythm.     Heart sounds: Normal heart sounds.  Pulmonary:     Effort: Pulmonary effort is normal. No respiratory distress.     Breath sounds: Normal breath sounds.  Abdominal:     General: There is no distension.     Palpations: Abdomen is soft.     Tenderness: There is no abdominal tenderness. There is no guarding.  Musculoskeletal:        General: Swelling (RUE) present.     Right shoulder: He exhibits tenderness and bony tenderness.     Right elbow: He exhibits decreased range of motion and swelling. He exhibits no deformity and no laceration. Tenderness found.     Right wrist: He exhibits swelling. He exhibits no tenderness.     Comments: Pain with movement of right arm, appears diffuse pain, pain with ROM of both elbow and shoulder/minimal movement  No erythema  Skin:    General: Skin is warm and dry.  Neurological:     Mental Status: He is alert. Mental status is at baseline.     Comments: States name, unable to provide more history      ED Treatments / Results  Labs (all labs ordered are listed, but only abnormal results are displayed) Labs Reviewed  CBC WITH DIFFERENTIAL/PLATELET - Abnormal; Notable for the following  components:       Result Value   RBC 3.30 (*)    Hemoglobin 10.1 (*)    HCT 33.0 (*)    All other components within normal limits  COMPREHENSIVE METABOLIC PANEL - Abnormal; Notable for the following components:   Glucose, Bld 242 (*)    BUN 34 (*)    Creatinine, Ser 1.53 (*)    Total Protein 6.2 (*)    Albumin 2.9 (*)    GFR calc non Af Amer 38 (*)    GFR calc Af Amer 44 (*)    All other components within normal limits    EKG None  Radiology Dg Shoulder Right  Result Date: 06/09/2019 CLINICAL DATA:  Suspected fall EXAM: RIGHT SHOULDER - 2+ VIEW COMPARISON:  None. FINDINGS: No fracture or dislocation of the right shoulder. There is moderate glenohumeral and acromioclavicular arthrosis with probable loose bodies in the glenohumeral joint space. Partially imaged right chest is unremarkable. IMPRESSION: No fracture or dislocation of the right shoulder. There is moderate glenohumeral and acromioclavicular arthrosis with probable loose bodies in the glenohumeral joint space. Electronically Signed   By: Eddie Candle M.D.   On: 06/09/2019 17:43   Dg Elbow Complete Right  Result Date: 06/09/2019 CLINICAL DATA:  Suspected fall EXAM: RIGHT ELBOW - COMPLETE 3+ VIEW COMPARISON:  None. FINDINGS: No definite fracture or dislocation of the right elbow. There may be a very subtle fracture fragment adjacent to the right radial head and a small elbow joint effusion. There is enthesopathic change and heterotopic ossification about the olecranon. Soft tissues are unremarkable. IMPRESSION: No definite fracture or dislocation of the right elbow. There may be a very subtle fracture fragment adjacent to the right radial head and a small elbow joint effusion. Electronically Signed   By: Eddie Candle M.D.   On: 06/09/2019 17:48   Dg Hip Unilat W Or Wo Pelvis 2-3 Views Right  Result Date: 06/09/2019 CLINICAL DATA:  Pain, suspected fall EXAM: DG HIP (WITH OR WITHOUT PELVIS) 2-3V RIGHT COMPARISON:  Radiograph January 18, 2019 FINDINGS:  No evidence of hip fracture or dislocation. Bones of the pelvis are congruent. Mild degenerative changes noted in the SI joints and both hips. Sclerotic changes of the femoral heads likely reflecting prior AVN. Additional discogenic changes are present in the included spine. Portions of the sacrum are obscured by overlying bowel gas. Phleboliths noted in the pelvis. Bowel gas pattern is normal. Vascular calcium noted in the medial thighs. IMPRESSION: No acute osseous abnormality. No evidence of hip fracture or dislocation. Chronic sclerotic changes of the femoral heads likely reflecting a vascular necrosis. Electronically Signed   By: Lovena Le M.D.   On: 06/09/2019 17:48   Ue Venous Duplex (mc & Wl 7 Am - 7 Pm)  Result Date: 06/09/2019 UPPER VENOUS STUDY  Indications: Swelling Other Indications: Patient on Eliquis. Limitations: Patient's inability to turn arm. Comparison Study: No prior study on file Performing Technologist: Sharion Dove RVS  Examination Guidelines: A complete evaluation includes B-mode imaging, spectral Doppler, color Doppler, and power Doppler as needed of all accessible portions of each vessel. Bilateral testing is considered an integral part of a complete examination. Limited examinations for reoccurring indications may be performed as noted.  Right Findings: +----------+------------+---------+-----------+----------+--------------+ RIGHT     CompressiblePhasicitySpontaneousProperties   Summary     +----------+------------+---------+-----------+----------+--------------+ IJV           Full                                                 +----------+------------+---------+-----------+----------+--------------+  Subclavian               Yes       Yes                             +----------+------------+---------+-----------+----------+--------------+ Axillary                 Yes       Yes                              +----------+------------+---------+-----------+----------+--------------+ Brachial      Full       Yes       Yes                             +----------+------------+---------+-----------+----------+--------------+ Radial        Full                                                 +----------+------------+---------+-----------+----------+--------------+ Ulnar                                               Not visualized +----------+------------+---------+-----------+----------+--------------+ Cephalic      Full                                                 +----------+------------+---------+-----------+----------+--------------+ Basilic       Full                                                 +----------+------------+---------+-----------+----------+--------------+  Left Findings: +----------+------------+---------+-----------+----------+-------+ LEFT      CompressiblePhasicitySpontaneousPropertiesSummary +----------+------------+---------+-----------+----------+-------+ Subclavian               Yes       Yes                      +----------+------------+---------+-----------+----------+-------+  Summary:  Right: No evidence of deep vein thrombosis in the upper extremity. No evidence of superficial vein thrombosis in the upper extremity.  Left: No evidence of thrombosis in the subclavian.  *See table(s) above for measurements and observations.    Preliminary     Procedures Procedures (including critical care time)  Medications Ordered in ED Medications - No data to display   Initial Impression / Assessment and Plan / ED Course  I have reviewed the triage vital signs and the nursing notes.  Pertinent labs & imaging results that were available during my care of the patient were reviewed by me and considered in my medical decision making (see chart for details).        85 year old male with history of coronary artery disease, CVA, insulin-dependent  diabetes, hypertension, chronic kidney disease, chronic depression and dementia with behavioral disturbance, recent admission October 30 to November 3 for metabolic encephalopathy with persistent right-sided weakness for which  she had CVA evaluation with MRI of the brain showing no acute findings, MRI of the cervical spine showing multilevel cervical spine disease and was evaluated by Dr. Christella Noa, incidental diagnosis of pulmonary emboli with bilateral lower extremity DVT started on Eliquis, who is recommended skilled nursing facility for physical debility and ambulatory dysfunction but this was declined by family, who presents now with concern for family desire for placement, with continued concern for right-sided pain.  Family reports that the right-sided pain noted at home is the same as that he had had prior to his recent admission.  Ordered DVT study of the right upper extremity which showed no evidence of right upper extremity DVT.  He has got normal pulses, with no sign of acute arterial thrombus.  No sign of cellulitis on exam.  Have low suspicion for septic arthritis.  His pain does seem to be diffuse located over the shoulder and elbow with range of motion.  X-ray of the shoulder was repeated which showed no evidence of acute fracture, but does show degenerative disease and possible loose bodies in the glenohumeral joint space, no definitive fracture of the right elbow, although radiology reports there might be a possible fracture fragment adjacent to the right radial head, x-ray of the right hip shows no acute abnormalities, does show chronic sclerotic changes. Discussed with Orthopedics who given hx described, agree that there may be multiple etiologies of pain. Will trial steroids for possible radicular pain or gout related pain.   I do see reasons for pain including known cervical spine disease, possible rotator cuff pathology, avascular necrosis, however no clear acute fractures and no indication  for admission by vital signs, history, exam or imaging.  SNF was recommended at time of discharge 11/3, however daughter thought she could handle him and now that she is home she realizes she cannot and has not been able to move him.  Consulted SW and CM regarding patient.     Ordered home meds. Pt currently medically cleared awaiting placement/SW/CM.     Final Clinical Impressions(s) / ED Diagnoses   Final diagnoses:  Physical deconditioning  Right arm pain  Dementia without behavioral disturbance, unspecified dementia type [facility])    ED Discharge Orders    None       Gareth Morgan, MD 06/10/19 [phone]

## 2019-06-09 NOTE — ED Triage Notes (Addendum)
Pt BIB EMS from home. Pt lives alone, has a daughter who helps with care. Last week pt seen at [facility] for fall, daughter concerned for stroke, pt found to have blood clots. Per EMS, pt daughter wants pt to be held at [facility] until he can be placed in SNF. Pt has hx of dementia, pt sometimes alert and oriented to self- this is baseline. Pt has hx of COPD. Pt on Eliquis, newly prescribed. Pt has external catheter, per EMS, catheter bag full with dark urine upon arrival. Pt daughter states she had not been giving pt newly prescribed insulin (different from previously prescribed). Per EMS, pt daughter states she has not been giving pt prescribed Haldol. Per EMS, pt daughter states "I can't move him, he has been in that position for 4 days". Pt guarding R arm, not allowing EMS to touch arm. Per EMS, pt daughter states he has some nerve damage to R arm.   CBG 224 142/70 HR 90's 97% RA

## 2019-06-10 LAB — CBG MONITORING, ED
Glucose-Capillary: 211 mg/dL — ABNORMAL HIGH (ref 70–99)
Glucose-Capillary: 171 mg/dL — ABNORMAL HIGH (ref 70–99)

## 2019-06-10 MED ORDER — MEMANTINE HCL-DONEPEZIL HCL ER 28-10 MG PO CP24: 1.0000 | ORAL_CAPSULE | Freq: Every day | ORAL | Status: DC

## 2019-06-10 MED ORDER — LORATADINE 10 MG PO TABS
10.0000 mg | ORAL_TABLET | Freq: Every day | ORAL | Status: DC
  Administered 2019-06-10 – 2019-06-13 (×4): 10 mg via ORAL
  Filled 2019-06-10 (×4): qty 1

## 2019-06-10 MED ORDER — INSULIN GLARGINE 100 UNIT/ML ~~LOC~~ SOLN
65.0000 [IU] | Freq: Every day | SUBCUTANEOUS | Status: DC
  Administered 2019-06-10 – 2019-06-12 (×4): 65 [IU] via SUBCUTANEOUS
  Filled 2019-06-10 (×5): qty 0.65

## 2019-06-10 MED ORDER — ALBUTEROL SULFATE HFA 108 (90 BASE) MCG/ACT IN AERS: 2.0000 | INHALATION_SPRAY | RESPIRATORY_TRACT | Status: DC | PRN

## 2019-06-10 MED ORDER — DONEPEZIL HCL 5 MG PO TABS
10.0000 mg | ORAL_TABLET | Freq: Every day | ORAL | Status: DC
  Administered 2019-06-10 – 2019-06-13 (×4): 10 mg via ORAL
  Filled 2019-06-10 (×4): qty 2

## 2019-06-10 MED ORDER — ESCITALOPRAM OXALATE 10 MG PO TABS
5.0000 mg | ORAL_TABLET | Freq: Every day | ORAL | Status: DC
  Administered 2019-06-10 – 2019-06-13 (×4): 5 mg via ORAL
  Filled 2019-06-10 (×4): qty 1

## 2019-06-10 MED ORDER — HYDROCHLOROTHIAZIDE 25 MG PO TABS
25.0000 mg | ORAL_TABLET | Freq: Every day | ORAL | Status: DC
  Administered 2019-06-10 – 2019-06-13 (×4): 25 mg via ORAL
  Filled 2019-06-10 (×4): qty 1

## 2019-06-10 MED ORDER — ASPIRIN EC 81 MG PO TBEC
81.0000 mg | DELAYED_RELEASE_TABLET | Freq: Every day | ORAL | Status: DC
  Administered 2019-06-10 – 2019-06-11 (×2): 81 mg via ORAL
  Filled 2019-06-10 (×2): qty 1

## 2019-06-10 MED ORDER — MEMANTINE HCL ER 28 MG PO CP24
28.0000 mg | ORAL_CAPSULE | Freq: Every day | ORAL | Status: DC
  Administered 2019-06-10 – 2019-06-13 (×4): 28 mg via ORAL
  Filled 2019-06-10 (×4): qty 1

## 2019-06-10 MED ORDER — SIMVASTATIN 20 MG PO TABS
20.0000 mg | ORAL_TABLET | Freq: Every day | ORAL | Status: DC
  Administered 2019-06-10 – 2019-06-13 (×4): 20 mg via ORAL
  Filled 2019-06-10 (×4): qty 1

## 2019-06-10 MED ORDER — TAMSULOSIN HCL 0.4 MG PO CAPS
0.4000 mg | ORAL_CAPSULE | Freq: Every day | ORAL | Status: DC
  Administered 2019-06-10 – 2019-06-12 (×3): 0.4 mg via ORAL
  Filled 2019-06-10 (×4): qty 1

## 2019-06-10 NOTE — ED Notes (Addendum)
Social work confirmed waiting on outside facility. Will continue to monitor. Pt denies food tray at this time

## 2019-06-10 NOTE — TOC Initial Note (Signed)
Transition of Care [facility]) - Initial/Assessment Note    Patient Details  Name: [name] MRN: [id number] Date of Birth: 14-Oct-1921  Transition of Care [facility]) CM/SW Contact:    Joellen Jersey, [place] Phone Number: 06/10/2019, 10:49 AM  Clinical Narrative:                 CSW spoke with patient's daughter, Demarlo Bhola by phone [phone]), as patient is oriented x1 at baseline. Patient was recently discharged from [facility] on 11/03 from the medical floor. During that hospitalization, the patient's daughter chose for the patient to discharge home with her and to receive home health, rather than discharging to a SNF. She stated she works from home and would be able to provide his care. She has a rolling walker with a seat and a bedside commode at home.   Unfortunately, patient's daughter states her father's care needs are beyond what she is able to provide and states she was not able to move the patient. She wishes to pursue SNF placement.   PT evaluation was completed on 11/02 and OT evaluation was completed on 10/31, both recommended SNF.  CSW explained to daughter that SNF placement from the ED would result in the patient discharging to the first SNF that makes a bed offer, or the patient would have the option to discharge home. It is not an appropriate use of ED resources for the patient to wait for a preferred bed placement. Daughter voices understanding.  Patient Goals and CMS Choice    Patient states their goals for this hospitalization and ongoing recovery are:: Daughter states she wishes for this patient to discharge to SNF.    Expected Discharge Plan and Services Expected Discharge Plan: SNF  In-house Referral: Clinical Social Work Discharge Planning Services: NA Post Acute Care Choice: Home Health Living arrangements for the past 2 months: Single Family Home                        Prior Living Arrangements/Services Living arrangements for the past 2 months: Single Family  Home Lives with:: Adult Children Patient language and need for interpreter reviewed:: No Do you feel safe going back to the place where you live?: Yes      Need for Family Participation in Patient Care: Yes (Comment) Care giver support system in place?: Yes (comment) Current home services: DME Criminal Activity/Legal Involvement Pertinent to Current Situation/Hospitalization: No - Comment as needed  Activities of Daily Living  Permission Sought/Granted Permission sought to share information with : Case Manager Permission granted to share information with : Yes, Verbal Permission Granted                           Permission granted to share info w AGENCY: SNFs                Emotional Assessment Appearance:: Appears stated age Attitude/Demeanor/Rapport: Unable to Assess Affect (typically observed): Unable to Assess Orientation: : Oriented to Self, Oriented to Place Alcohol / Substance Use: Not Applicable Psych Involvement: No (comment)  Admission diagnosis:  Unable to Determine due to Patient Condition  Patient Active Problem List   Diagnosis Date Noted  . Pulmonary embolism ([place]) 06/01/2019  . Acute renal failure superimposed on stage 3 chronic kidney disease ([place]) 06/01/2019  . Chronic diastolic CHF (congestive heart failure) ([place]) 06/01/2019  . Pain due to onychomycosis of toenails of both feet 01/23/2019  . Bilateral lower  extremity edema 07/05/2017  . Pulmonary nodules 04/13/2017  . Centrilobular emphysema ([place]) 04/13/2017  . Calculus of gallbladder without cholecystitis without obstruction 04/13/2017  . Chronic cough 04/13/2017  . Adenoma of left adrenal gland 04/13/2017  . Frequent falls 04/01/2017  . Type 2 diabetes mellitus with stage 3 chronic kidney disease, with long-term current use of insulin ([place]) 04/01/2017  . History of tobacco abuse 04/01/2017  . Unsteady gait 04/01/2017  . Dysarthria 04/01/2017  . History of stroke   . Hyperlipidemia   . Moderate  dementia with behavioral disturbance ([place]) 09/19/2015  . Depression 09/19/2015  . Hypertensive heart disease 09/10/2015  . Type 2 diabetes, controlled, with peripheral circulatory disorder ([place])   . Essential hypertension   . Coronary artery disease involving native coronary artery of native heart with angina pectoris ([place])   . Arthritis   . Glaucoma    PCP:  Gayland Curry, DO Pharmacy:   [place], [place] - [address] [place] [address] Phone: [phone] Fax: [facility] - [place], [place] [address] Phone: [phone] Fax: [phone]     Social Determinants of Health (SDOH) Interventions    Readmission Risk Interventions No flowsheet data found.

## 2019-06-10 NOTE — ED Notes (Addendum)
Pt removed condom cath. Pt placed on bed pan. PT urinated and small black tarry BM noted.

## 2019-06-10 NOTE — ED Notes (Signed)
Provided food tray to pt. [name] assisted with set up.

## 2019-06-10 NOTE — ED Notes (Signed)
Pt and belongings transfered to [facility] room 27

## 2019-06-10 NOTE — ED Notes (Signed)
Assumed care of patient, patient found with condom cath at bedside and drainage tubing clamped in bed rail. Pt cleaned and condom cath reapplied. Pt calm and cooperative and pleasant. Awaiting consult.

## 2019-06-10 NOTE — Progress Notes (Addendum)
CSW completed PASSR for patient: [id number]  Patient's family aware that SNF placement from the ED will require the family to accept the first SNF bed offer or have the option to discharge home with Home Health. Family is aware that it is not an appropriate use of ED for patient to remain while waiting for preferred SNF bed offer.  With this in mind, patient's family most prefers: [facility], [facility], or [facility]  Verbal permission granted by daughter, [name], to fax out patient for SNF placement.  Update 12:15pm CSW completed PASSR and FL-2. Patient faxed out for SNF placement.  Stephanie Acre, [place] Social Worker [phone]

## 2019-06-10 NOTE — NC FL2 (Signed)
[place] LEVEL OF CARE SCREENING TOOL     IDENTIFICATION  Patient Name: [name] Birthdate: Dec 04, 1921 Sex: male Admission Date (Current Location): 06/09/2019  [facility] and [place] Number:  Herbalist and Address:  [facility],  [place] [address], [place]      Provider Number: [id number]  Attending Physician Name and Address:  Default, Provider, MD  Relative Name and Phone Number:  Derek Jack Sylvanus Hullum [phone]    Current Level of Care: Hospital Recommended Level of Care: [place] Prior Approval Number:    Date Approved/Denied:   PASRR Number: [id number]  Discharge Plan: SNF    Current Diagnoses: Patient Active Problem List   Diagnosis Date Noted  . Pulmonary embolism ([place]) 06/01/2019  . Acute renal failure superimposed on stage 3 chronic kidney disease ([place]) 06/01/2019  . Chronic diastolic CHF (congestive heart failure) ([place]) 06/01/2019  . Pain due to onychomycosis of toenails of both feet 01/23/2019  . Bilateral lower extremity edema 07/05/2017  . Pulmonary nodules 04/13/2017  . Centrilobular emphysema ([place]) 04/13/2017  . Calculus of gallbladder without cholecystitis without obstruction 04/13/2017  . Chronic cough 04/13/2017  . Adenoma of left adrenal gland 04/13/2017  . Frequent falls 04/01/2017  . Type 2 diabetes mellitus with stage 3 chronic kidney disease, with long-term current use of insulin ([place]) 04/01/2017  . History of tobacco abuse 04/01/2017  . Unsteady gait 04/01/2017  . Dysarthria 04/01/2017  . History of stroke   . Hyperlipidemia   . Moderate dementia with behavioral disturbance ([place]) 09/19/2015  . Depression 09/19/2015  . Hypertensive heart disease 09/10/2015  . Type 2 diabetes, controlled, with peripheral circulatory disorder ([place])   . Essential hypertension   . Coronary artery disease involving native coronary artery of native heart with angina pectoris ([place])   . Arthritis    . Glaucoma     Orientation RESPIRATION BLADDER Height & Weight     Self  Normal External catheter Weight:   Height:     BEHAVIORAL SYMPTOMS/MOOD NEUROLOGICAL BOWEL NUTRITION STATUS      Continent Diet(Thin liquid)  AMBULATORY STATUS COMMUNICATION OF NEEDS Skin   Extensive Assist Verbally Normal                       Personal Care Assistance Level of Assistance  Bathing, Feeding, Dressing, Total care Bathing Assistance: Maximum assistance Feeding assistance: Limited assistance Dressing Assistance: Maximum assistance Total Care Assistance: Maximum assistance   Functional Limitations Info             SPECIAL CARE FACTORS FREQUENCY  PT (By licensed PT), OT (By licensed OT)     PT Frequency: 5x weekly OT Frequency: 5x weekly            Contractures Contractures Info: Not present    Additional Factors Info  Code Status, Allergies Code Status Info: Full Allergies Info: Lisinopril           Current Medications (06/10/2019):  This is the current hospital active medication list Current Facility-Administered Medications  Medication Dose Route Frequency Provider Last Rate Last Dose  . albuterol (VENTOLIN HFA) 108 (90 Base) MCG/ACT inhaler 2 puff  2 puff Inhalation Q4H PRN Gareth Morgan, MD      . aspirin EC tablet 81 mg  81 mg Oral Daily Gareth Morgan, MD   81 mg at 06/10/19 1014  . donepezil (ARICEPT) tablet 10 mg  10 mg Oral Daily Gareth Morgan, MD  10 mg at 06/10/19 1015  . escitalopram (LEXAPRO) tablet 5 mg  5 mg Oral Daily Gareth Morgan, MD   5 mg at 06/10/19 1014  . hydrochlorothiazide (HYDRODIURIL) tablet 25 mg  25 mg Oral Daily Gareth Morgan, MD   25 mg at 06/10/19 1016  . insulin glargine (LANTUS) injection 65 Units  65 Units Subcutaneous QHS Gareth Morgan, MD   65 Units at 06/10/19 0210  . loratadine (CLARITIN) tablet 10 mg  10 mg Oral Daily Gareth Morgan, MD   10 mg at 06/10/19 1014  . memantine (NAMENDA XR) 24 hr capsule 28 mg   28 mg Oral Daily Gareth Morgan, MD   28 mg at 06/10/19 1016  . simvastatin (ZOCOR) tablet 20 mg  20 mg Oral Daily Gareth Morgan, MD   20 mg at 06/10/19 1016  . tamsulosin (FLOMAX) capsule 0.4 mg  0.4 mg Oral QPC supper Gareth Morgan, MD       Current Outpatient Medications  Medication Sig Dispense Refill  . albuterol (PROVENTIL HFA;VENTOLIN HFA) 108 (90 Base) MCG/ACT inhaler INHALE TWO PUFFS EVERY 4-6 HOURS AS NEEDED FOR DIFFICULTY BREATHING,WHEEZING OR COUGH (Patient taking differently: Inhale 2 puffs into the lungs every 4 (four) hours as needed for wheezing or shortness of breath. INHALE TWO PUFFS EVERY 4-6 HOURS AS NEEDED FOR DIFFICULTY BREATHING,WHEEZING OR COUGH) 18 g 3  . amLODipine (NORVASC) 5 MG tablet Take 5 mg by mouth daily.    [name] apixaban (ELIQUIS) 5 MG TABS tablet Take 2 tablets (10mg ) twice daily for 7 days, then 1 tablet (5mg ) twice daily 60 tablet 0  . aspirin EC 81 MG tablet Take 1 tablet (81 mg total) by mouth daily. 150 tablet 2  . escitalopram (LEXAPRO) 5 MG tablet TAKE 1 TABLET EVERY DAY (Patient taking differently: Take 5 mg by mouth daily. TAKE 1 TABLET EVERY DAY) 90 tablet 1  . hydrochlorothiazide (HYDRODIURIL) 25 MG tablet Take 25 mg by mouth daily.    . Insulin Glargine (TOUJEO SOLOSTAR [place]) Inject 65 Units into the skin at bedtime.    . Loratadine 10 MG CAPS Take 1 capsule (10 mg total) by mouth daily. 90 capsule 1  . losartan (COZAAR) 50 MG tablet Take 50 mg by mouth daily.    . Multiple Vitamins-Minerals (SENTRY SENIOR) TABS Take 1 tablet by mouth daily.    [name] NAMZARIC 28-10 MG CP24 TAKE 1 CAPSULE EVERY DAY (Patient taking differently: Take 1 capsule by mouth daily. ) 90 capsule 1  . naproxen sodium (ALEVE) 220 MG tablet Take 440 mg by mouth 2 (two) times daily as needed (headache/pain).    . simvastatin (ZOCOR) 20 MG tablet Take 20 mg by mouth daily.    . tamsulosin (FLOMAX) 0.4 MG CAPS capsule Take 0.4 mg by mouth daily after supper.    . triamcinolone cream  (KENALOG) 0.1 % APPLY 1 APPLICATION OF CREAM TWICE DAILY FOR 14 DAYS TO LOWER EXTRMITIES (Patient taking differently: Apply 1 application topically 2 (two) times daily. APPLY 1 APPLICATION OF CREAM TWICE DAILY FOR 14 DAYS TO LOWER EXTRMITIES) 30 g 0  . Alcohol Swabs (B-D SINGLE USE SWABS REGULAR) PADS USE AS DIRECTED WITH CHECKING BLOOD SUGAR THREE TIMES DAILY 300 each 3  . Blood Glucose Calibration (ACCU-CHEK AVIVA) SOLN Use as Directed. Dx: E11.51 3 each 3  . glucose blood (ACCU-CHEK AVIVA) test strip Use to test blood sugar three times daily. Dx: E11.51 300 each 1  . haloperidol (HALDOL) 5 MG tablet Take 1 tablet (5  mg total) by mouth every 6 (six) hours as needed for agitation. Give at dinnertime and at bedtime (Patient not taking: Reported on 06/09/2019) 60 tablet 0  . Insulin Glargine (LANTUS) 100 UNIT/ML Solostar Pen Inject 7 Units into the skin daily. (Patient not taking: Reported on 06/09/2019) 15 mL 0  . Insulin Pen Needle (B-D UF III MINI PEN NEEDLES) 31G X 5 MM MISC USE AS DIRECTED. 270 each 11  . Lancet Devices (ACCU-CHEK SOFTCLIX) lancets Use as instructed with checking blood sugar three times daily. Dx: E11.51 300 each 3  . mineral oil-hydrophilic petrolatum (AQUAPHOR) ointment Apply topically 2 (two) times daily. To legs (Patient not taking: Reported on 06/09/2019) 420 g 0     Discharge Medications: Please see discharge summary for a list of discharge medications.  Relevant Imaging Results:  Relevant Lab Results:   Additional Information SSN: [id number]  Joellen Jersey, [place]

## 2019-06-11 LAB — CBG MONITORING, ED: Glucose-Capillary: 98 mg/dL (ref 70–99)

## 2019-06-11 MED ORDER — APIXABAN 5 MG PO TABS: 5.0000 mg | ORAL_TABLET | Freq: Two times a day (BID) | ORAL | Status: DC

## 2019-06-11 MED ORDER — APIXABAN 5 MG PO TABS
10.0000 mg | ORAL_TABLET | Freq: Two times a day (BID) | ORAL | Status: AC
  Administered 2019-06-11 (×2): 10 mg via ORAL
  Filled 2019-06-11 (×2): qty 2

## 2019-06-11 MED ORDER — ACETAMINOPHEN 500 MG PO TABS
1000.0000 mg | ORAL_TABLET | Freq: Once | ORAL | Status: AC
  Administered 2019-06-11: 04:00:00 1000 mg via ORAL
  Filled 2019-06-11: qty 2

## 2019-06-11 MED ORDER — APIXABAN 5 MG PO TABS
10.0000 mg | ORAL_TABLET | Freq: Two times a day (BID) | ORAL | Status: DC
  Filled 2019-06-11: qty 2

## 2019-06-11 MED ORDER — APIXABAN 5 MG PO TABS
5.0000 mg | ORAL_TABLET | Freq: Two times a day (BID) | ORAL | Status: DC
  Administered 2019-06-12 – 2019-06-13 (×3): 5 mg via ORAL
  Filled 2019-06-11 (×4): qty 1

## 2019-06-11 NOTE — Progress Notes (Signed)
06/11/2019  1135  Ask Dr Johnney Killian if patient was needing Eliquis. Waiting for response.

## 2019-06-11 NOTE — Evaluation (Signed)
Physical Therapy Evaluation Patient Details Name: [name] MRN: [id number] DOB: 19-Feb-1922 Today's Date: 06/11/2019   History of Present Illness  [name] is a 85 y.o. male with medical history significant for coronary artery disease, history of CVA, insulin-dependent diabetes mellitus, hypertension, chronic kidney disease stage III and depression with behavioral disturbance, now presenting to the emergency department for evaluation of somnolence and R sided weakness. CT and MRI of head negative for acute infarct., but whowed remote anterior Lt frontal lobe infarct.  CTA showed bil. Lower lobe PEs.  He then discharged home with his daughter and HHPT on 11/3, then on 11/7 returned to the ED due to family having signiicant difficulty in caring for him and requesting SNF placement.  Clinical Impression   Patient received in bed, A&Ox1 and very pleasantly confused but only intermittently following commands and laughing often and randomly during session. Required MaxAx1 for supine to sit, sat at EOB with min guard for safety for approximately 5 minutes then laid back down when PT was trying to assess LE MMT, required MinA for LE management back into bed. Session limited by patient participation, PT also chose to limit session given unsure status of anticoagulants (RN to f/u with MD). He was left in bed positioned to comfort with all needs met and bed alarm active. Currently recommending SNF moving forward.     Follow Up Recommendations SNF;Supervision/Assistance - 24 hour    Equipment Recommendations  Other (comment)(defer)    Recommendations for Other Services       Precautions / Restrictions Precautions Precautions: Fall Precaution Comments: pt fell 2x at home before coming in Restrictions Weight Bearing Restrictions: No      Mobility  Bed Mobility Overal bed mobility: Needs Assistance Bed Mobility: Supine to Sit     Supine to sit: Max assist Sit to supine: Mod assist    General bed mobility comments: MaxA for LE management but able to assit with trunk control; sat at EOB for approximately 5 minutes with min guard then laid trunk back down when PT trying to assess LE MMT, MinA to bring legs back into bed  Transfers                 General transfer comment: will need +2 for safety  Ambulation/Gait             General Gait Details: will need +2 for safety  Stairs            Wheelchair Mobility    Modified Rankin (Stroke Patients Only)       Balance Overall balance assessment: Needs assistance;History of Falls Sitting-balance support: Feet supported;No upper extremity supported Sitting balance-Leahy Scale: Fair Sitting balance - Comments: Pt able to maintain EOB sitting with close min guard assist        Standing balance comment: deferred will need +2 assist                             Pertinent Vitals/Pain Pain Assessment: Faces Pain Score: 0-No pain Faces Pain Scale: No hurt Pain Intervention(s): Limited activity within patient's tolerance;Monitored during session    Home Living Family/patient expects to be discharged to:: Skilled nursing facility Living Arrangements: Children Available Help at Discharge: Family;Available 24 hours/day Type of Home: House Home Access: Stairs to enter Entrance Stairs-Rails: Can reach both;Right;Left Entrance Stairs-Number of Steps: 1 Home Layout: One level Home Equipment: Walker - 2 wheels;Kasandra Knudsen - single point Additional Comments: family  having significant difficulty in caring for patient at home, now requesting SNF Placement    Prior Function Level of Independence: Needs assistance   Gait / Transfers Assistance Needed: sometimes ambulated with cane or RW but per dauhter, usually used nothing  ADL's / Homemaking Assistance Needed: daughter helps him bathe and dress.  Pt is able to bathe upper body with min A, and was able to feed self and perform toileting mod I          Hand Dominance   Dominant Hand: Right    Extremity/Trunk Assessment   Upper Extremity Assessment RUE Deficits / Details: does not use R UE, lets it rest in lap and uses L UE for all tasks but able to lift against gravity with cues from PT LUE Deficits / Details: Grossly WFL during functional tasks.  Pt repeatedly uses Lt UE despite being Rt handed     Lower Extremity Assessment Lower Extremity Assessment: Generalized weakness RLE Deficits / Details: attempted to perform MMT at EOB but patient refused, laying back down RLE Sensation: decreased proprioception RLE Coordination: decreased gross motor    Cervical / Trunk Assessment Cervical / Trunk Assessment: Kyphotic  Communication   Communication: HOH  Cognition Arousal/Alertness: Awake/alert Behavior During Therapy: Flat affect Overall Cognitive Status: Impaired/Different from baseline Area of Impairment: Problem solving;Following commands;Attention;Awareness;Safety/judgement;Memory;Orientation                 Orientation Level: Disoriented to;Place;Time;Situation Current Attention Level: Sustained Memory: Decreased short-term memory Following Commands: Follows one step commands inconsistently;Follows one step commands with increased time Safety/Judgement: Decreased awareness of deficits Awareness: Intellectual Problem Solving: Slow processing;Decreased initiation;Difficulty sequencing;Requires verbal cues;Requires tactile cues General Comments: patient interactive, HOH and randomly laughing during session      General Comments      Exercises     Assessment/Plan    PT Assessment Patient needs continued PT services  PT Problem List Decreased strength;Decreased range of motion;Decreased activity tolerance;Decreased balance;Decreased mobility;Decreased coordination;Decreased cognition;Decreased knowledge of use of DME;Decreased safety awareness;Decreased knowledge of precautions;Impaired sensation       PT  Treatment Interventions DME instruction;Gait training;Stair training;Functional mobility training;Therapeutic activities;Therapeutic exercise;Balance training;Neuromuscular re-education;Cognitive remediation;Patient/family education    PT Goals (Current goals can be found in the Care Plan section)  Acute Rehab PT Goals Patient Stated Goal: go to SNF For rehab PT Goal Formulation: With family Time For Goal Achievement: 06/25/19 Potential to Achieve Goals: Good    Frequency Min 2X/week   Barriers to discharge        Co-evaluation               AM-PAC PT "6 Clicks" Mobility  Outcome Measure Help needed turning from your back to your side while in a flat bed without using bedrails?: A Lot Help needed moving from lying on your back to sitting on the side of a flat bed without using bedrails?: A Lot Help needed moving to and from a bed to a chair (including a wheelchair)?: A Lot Help needed standing up from a chair using your arms (e.g., wheelchair or bedside chair)?: Total Help needed to walk in hospital room?: Total Help needed climbing 3-5 steps with a railing? : Total 6 Click Score: 9    End of Session   Activity Tolerance: Patient tolerated treatment well Patient left: in bed;with call bell/phone within reach;with bed alarm set   PT Visit Diagnosis: Unsteadiness on feet (R26.81);Muscle weakness (generalized) (M62.81);History of falling (Z91.81);Difficulty in walking, not elsewhere classified (R26.2);Apraxia (R48.2);Hemiplegia and hemiparesis  Hemiplegia - Right/Left: Right Hemiplegia - dominant/non-dominant: Dominant Hemiplegia - caused by: Unspecified    Time: 1125-1140 PT Time Calculation (min) (ACUTE ONLY): 15 min   Charges:   PT Evaluation $PT Eval Moderate Complexity: 1 Mod         Windell Norfolk, DPT, CBIS  Supplemental Physical Therapist [place]    Pager [phone] Acute Rehab Office [phone]

## 2019-06-11 NOTE — Progress Notes (Addendum)
Patient's daughter chose [facility] for SNF placement. CSW called [facility] to confirm. CSW spoke with [facility] who reports they had many patient admit today and should have a bed open tomorrow. She will start insurance auth when they know they will have a bed.    Golden Circle, LCSW Transitions of Care Department [facility] ED [phone]

## 2019-06-11 NOTE — Progress Notes (Signed)
Discussed patient's recent PE/clots with RN as this PT unable to find today/yesterday' anticoagulant dosage in chart, RN to f/u with MD but does report patient still OK to participate in PT this morning.   [name], DPT, CBIS  Supplemental Physical Therapist [facility]    Pager [phone] Acute Rehab Office [phone]

## 2019-06-11 NOTE — Progress Notes (Signed)
CSW following up on SNF placement. CSW had a PT consult ordered by EDP in which will be needed for insurance authorization. So far, patient has been offered a bed at [facility].   Golden Circle, LCSW Transitions of Care Department [facility] ED [phone]

## 2019-06-12 LAB — CBG MONITORING, ED
Glucose-Capillary: 28 mg/dL — CL (ref 70–99)
Glucose-Capillary: 178 mg/dL — ABNORMAL HIGH (ref 70–99)
Glucose-Capillary: 166 mg/dL — ABNORMAL HIGH (ref 70–99)
Glucose-Capillary: 153 mg/dL — ABNORMAL HIGH (ref 70–99)

## 2019-06-12 LAB — SARS CORONAVIRUS 2 (TAT 6-24 HRS): SARS Coronavirus 2: NEGATIVE

## 2019-06-12 NOTE — Progress Notes (Signed)
2:00p CSW spoke with [name] at [facility] who reports she will accept patient. [name] to start insurance auth with [employer]   10:50a CSW spoke with [facility] who reports they have on bed availabilty at this time. CSW spoke with patient's daughter and will keep updated on any other bed offers that come in. Patient's daughter does not want patient to go to [facility] and says the other 2 facilities that are in [place] are too far. CSW explained that she will have to make a decisions today. CSW to follow up with other SNFs that have not made bed offers in the hub for confirmation of bed offers.   Golden Circle, LCSW Transitions of Care Department [facility] ED [phone]

## 2019-06-12 NOTE — ED Provider Notes (Addendum)
Patient with glucose of 28.  However he is awake.  He is on insulin.  We will give him p.o.'s.  He is eating breakfast now.  Will recheck sugar.  Patient overall doing well.   Lennice Sites, DO 06/12/19 [id number]    Lennice Sites, DO 06/12/19 [phone]

## 2019-06-12 NOTE — ED Notes (Addendum)
Patient blood sugar 27. Notified MD. Gave orange juice.

## 2019-06-12 NOTE — ED Notes (Signed)
Patient had large, soft brown bowel movement. Tech Risk analyst cleaned patient. Patient has wound to coccyx. New dressing applied after patient cleaned and changed.

## 2019-06-13 ENCOUNTER — Encounter (HOSPITAL_COMMUNITY): Payer: Self-pay | Admitting: Family Medicine

## 2019-06-13 DIAGNOSIS — D631 Anemia in chronic kidney disease: Secondary | ICD-10-CM | POA: Diagnosis not present

## 2019-06-13 DIAGNOSIS — R1311 Dysphagia, oral phase: Secondary | ICD-10-CM | POA: Diagnosis not present

## 2019-06-13 DIAGNOSIS — Z7401 Bed confinement status: Secondary | ICD-10-CM | POA: Diagnosis not present

## 2019-06-13 DIAGNOSIS — Z79899 Other long term (current) drug therapy: Secondary | ICD-10-CM | POA: Diagnosis not present

## 2019-06-13 DIAGNOSIS — D649 Anemia, unspecified: Secondary | ICD-10-CM | POA: Diagnosis not present

## 2019-06-13 DIAGNOSIS — E1121 Type 2 diabetes mellitus with diabetic nephropathy: Secondary | ICD-10-CM | POA: Diagnosis not present

## 2019-06-13 DIAGNOSIS — N1832 Chronic kidney disease, stage 3b: Secondary | ICD-10-CM | POA: Diagnosis not present

## 2019-06-13 DIAGNOSIS — I2694 Multiple subsegmental pulmonary emboli without acute cor pulmonale: Secondary | ICD-10-CM | POA: Diagnosis not present

## 2019-06-13 DIAGNOSIS — I2699 Other pulmonary embolism without acute cor pulmonale: Secondary | ICD-10-CM | POA: Diagnosis not present

## 2019-06-13 DIAGNOSIS — I130 Hypertensive heart and chronic kidney disease with heart failure and stage 1 through stage 4 chronic kidney disease, or unspecified chronic kidney disease: Secondary | ICD-10-CM | POA: Diagnosis not present

## 2019-06-13 DIAGNOSIS — Z794 Long term (current) use of insulin: Secondary | ICD-10-CM | POA: Diagnosis not present

## 2019-06-13 DIAGNOSIS — R2681 Unsteadiness on feet: Secondary | ICD-10-CM | POA: Diagnosis not present

## 2019-06-13 DIAGNOSIS — I25119 Atherosclerotic heart disease of native coronary artery with unspecified angina pectoris: Secondary | ICD-10-CM | POA: Diagnosis not present

## 2019-06-13 DIAGNOSIS — R488 Other symbolic dysfunctions: Secondary | ICD-10-CM | POA: Diagnosis not present

## 2019-06-13 DIAGNOSIS — R5381 Other malaise: Secondary | ICD-10-CM | POA: Diagnosis not present

## 2019-06-13 DIAGNOSIS — R0902 Hypoxemia: Secondary | ICD-10-CM | POA: Diagnosis not present

## 2019-06-13 DIAGNOSIS — R4189 Other symptoms and signs involving cognitive functions and awareness: Secondary | ICD-10-CM | POA: Diagnosis not present

## 2019-06-13 DIAGNOSIS — E1122 Type 2 diabetes mellitus with diabetic chronic kidney disease: Secondary | ICD-10-CM | POA: Diagnosis not present

## 2019-06-13 DIAGNOSIS — I10 Essential (primary) hypertension: Secondary | ICD-10-CM | POA: Diagnosis not present

## 2019-06-13 DIAGNOSIS — M2550 Pain in unspecified joint: Secondary | ICD-10-CM | POA: Diagnosis not present

## 2019-06-13 DIAGNOSIS — Z8673 Personal history of transient ischemic attack (TIA), and cerebral infarction without residual deficits: Secondary | ICD-10-CM | POA: Diagnosis not present

## 2019-06-13 DIAGNOSIS — Z20828 Contact with and (suspected) exposure to other viral communicable diseases: Secondary | ICD-10-CM | POA: Diagnosis not present

## 2019-06-13 DIAGNOSIS — F0390 Unspecified dementia without behavioral disturbance: Secondary | ICD-10-CM | POA: Diagnosis not present

## 2019-06-13 DIAGNOSIS — N183 Chronic kidney disease, stage 3 unspecified: Secondary | ICD-10-CM | POA: Diagnosis not present

## 2019-06-13 DIAGNOSIS — N179 Acute kidney failure, unspecified: Secondary | ICD-10-CM | POA: Diagnosis not present

## 2019-06-13 DIAGNOSIS — Z7901 Long term (current) use of anticoagulants: Secondary | ICD-10-CM | POA: Diagnosis not present

## 2019-06-13 DIAGNOSIS — E0821 Diabetes mellitus due to underlying condition with diabetic nephropathy: Secondary | ICD-10-CM | POA: Diagnosis not present

## 2019-06-13 DIAGNOSIS — R262 Difficulty in walking, not elsewhere classified: Secondary | ICD-10-CM | POA: Diagnosis not present

## 2019-06-13 DIAGNOSIS — M6281 Muscle weakness (generalized): Secondary | ICD-10-CM | POA: Diagnosis not present

## 2019-06-13 DIAGNOSIS — R52 Pain, unspecified: Secondary | ICD-10-CM | POA: Diagnosis not present

## 2019-06-13 DIAGNOSIS — I959 Hypotension, unspecified: Secondary | ICD-10-CM | POA: Diagnosis not present

## 2019-06-13 DIAGNOSIS — I82403 Acute embolism and thrombosis of unspecified deep veins of lower extremity, bilateral: Secondary | ICD-10-CM | POA: Diagnosis not present

## 2019-06-13 DIAGNOSIS — I509 Heart failure, unspecified: Secondary | ICD-10-CM | POA: Diagnosis not present

## 2019-06-13 DIAGNOSIS — U071 COVID-19: Secondary | ICD-10-CM | POA: Diagnosis not present

## 2019-06-13 DIAGNOSIS — I119 Hypertensive heart disease without heart failure: Secondary | ICD-10-CM | POA: Diagnosis not present

## 2019-06-13 LAB — CBG MONITORING, ED
Glucose-Capillary: 32 mg/dL — CL (ref 70–99)
Glucose-Capillary: 32 mg/dL — CL (ref 70–99)
Glucose-Capillary: 55 mg/dL — ABNORMAL LOW (ref 70–99)
Glucose-Capillary: 128 mg/dL — ABNORMAL HIGH (ref 70–99)
Glucose-Capillary: 139 mg/dL — ABNORMAL HIGH (ref 70–99)

## 2019-06-13 MED ORDER — APIXABAN 5 MG PO TABS: ORAL_TABLET | ORAL | 0 refills | Status: DC

## 2019-06-13 MED ORDER — ESCITALOPRAM OXALATE 5 MG PO TABS: 5.0000 mg | ORAL_TABLET | Freq: Every day | ORAL | 0 refills | Status: DC

## 2019-06-13 MED ORDER — ASPIRIN EC 81 MG PO TBEC: 81.0000 mg | DELAYED_RELEASE_TABLET | Freq: Every day | ORAL | 2 refills | Status: DC

## 2019-06-13 MED ORDER — LOSARTAN POTASSIUM 50 MG PO TABS: 50.0000 mg | ORAL_TABLET | Freq: Every day | ORAL | 0 refills | Status: DC

## 2019-06-13 MED ORDER — NAMZARIC 28-10 MG PO CP24: 1.0000 | ORAL_CAPSULE | Freq: Every day | ORAL | 0 refills | Status: DC

## 2019-06-13 MED ORDER — TOUJEO SOLOSTAR 300 UNIT/ML ~~LOC~~ SOPN: 50.0000 [IU] | PEN_INJECTOR | Freq: Every day | SUBCUTANEOUS | 2 refills | Status: DC

## 2019-06-13 MED ORDER — SENTRY SENIOR PO TABS: 1.0000 | ORAL_TABLET | Freq: Every day | ORAL | 0 refills | Status: DC

## 2019-06-13 MED ORDER — LORATADINE 10 MG PO CAPS: 1.0000 | ORAL_CAPSULE | Freq: Every day | ORAL | 0 refills | Status: DC

## 2019-06-13 MED ORDER — HYDROCHLOROTHIAZIDE 25 MG PO TABS: 25.0000 mg | ORAL_TABLET | Freq: Every day | ORAL | 0 refills | Status: DC

## 2019-06-13 MED ORDER — ALBUTEROL SULFATE HFA 108 (90 BASE) MCG/ACT IN AERS: 2.0000 | INHALATION_SPRAY | RESPIRATORY_TRACT | 0 refills | Status: DC | PRN

## 2019-06-13 MED ORDER — SIMVASTATIN 20 MG PO TABS: 20.0000 mg | ORAL_TABLET | Freq: Every day | ORAL | 0 refills | Status: DC

## 2019-06-13 MED ORDER — SENTRY SENIOR PO TABS: 1.0000 | ORAL_TABLET | Freq: Every day | ORAL | 0 refills | Status: AC

## 2019-06-13 MED ORDER — ASPIRIN EC 81 MG PO TBEC: 81.0000 mg | DELAYED_RELEASE_TABLET | Freq: Every day | ORAL | 2 refills | Status: AC

## 2019-06-13 MED ORDER — TAMSULOSIN HCL 0.4 MG PO CAPS: 0.4000 mg | ORAL_CAPSULE | Freq: Every day | ORAL | 0 refills | Status: DC

## 2019-06-13 MED ORDER — AMLODIPINE BESYLATE 5 MG PO TABS: 5.0000 mg | ORAL_TABLET | Freq: Every day | ORAL | 0 refills | Status: DC

## 2019-06-13 MED ORDER — ALBUTEROL SULFATE HFA 108 (90 BASE) MCG/ACT IN AERS: 2.0000 | INHALATION_SPRAY | RESPIRATORY_TRACT | 0 refills | Status: AC | PRN

## 2019-06-13 MED ORDER — INSULIN GLARGINE 100 UNIT/ML ~~LOC~~ SOLN
50.0000 [IU] | Freq: Every day | SUBCUTANEOUS | Status: DC
  Filled 2019-06-13: qty 0.5

## 2019-06-13 NOTE — ED Notes (Signed)
Pt has been given breakfast.  Pt ate all of this food.

## 2019-06-13 NOTE — Progress Notes (Addendum)
CSW received call from [facility] has been received. CSW notified EDP and RN. CSW also notified patient's daughter. Patient to be transported via [facility]. [place] number for reports is [phone] and patient will be going to room 806a.   Golden Circle, LCSW Transitions of Care Department [facility] ED [phone]

## 2019-06-13 NOTE — ED Notes (Signed)
Changed his condom cath.

## 2019-06-13 NOTE — ED Notes (Signed)
Re-check of pt's blood sugar was 55.  Pt given 4oz of juice.  Pt remains alert. Dr. Theora Gianotti made aware of pt's blood sugar and interventions.

## 2019-06-13 NOTE — ED Notes (Signed)
Informed by Tech that pt's CBG is 128 after eating breakfast.

## 2019-06-13 NOTE — ED Notes (Signed)
Another blood sugar of 32, provided patient 8 oz of apple juice. He remains stable with being alert, and skin dry.

## 2019-06-13 NOTE — ED Notes (Signed)
Patient is randomly hollering out loud. He appears to be in no acute distress. Skin is warm and dry. Several staff have went into room for re-direction of not randomly hollering out.

## 2019-06-13 NOTE — ED Provider Notes (Signed)
Patient's blood sugar this morning is 32.  Exact same thing happened yesterday with a blood sugar of 27.  Patient was given juice and on my inspection patient is awake and alert and does not appear to be in any acute distress.  He is waiting for his breakfast tray.  However given this is now the second day in a row he has had hypoglycemia in the morning patient's Lantus was reduced to 50 units at night from 65.   Blanchie Dessert, MD 06/13/19 [phone]

## 2019-06-13 NOTE — Progress Notes (Signed)
06/13/19  1504  Called report to [facility] [phone]. Report given to [facility].

## 2019-06-13 NOTE — ED Notes (Signed)
After obtaining a blood sugar, provided patient 12 oz of orange juice. Patients skin is dry and he is alert. He continues to randomly yell out.

## 2019-06-14 DIAGNOSIS — N1832 Chronic kidney disease, stage 3b: Secondary | ICD-10-CM | POA: Diagnosis not present

## 2019-06-14 DIAGNOSIS — N179 Acute kidney failure, unspecified: Secondary | ICD-10-CM | POA: Diagnosis not present

## 2019-06-14 DIAGNOSIS — I2694 Multiple subsegmental pulmonary emboli without acute cor pulmonale: Secondary | ICD-10-CM | POA: Diagnosis not present

## 2019-06-14 DIAGNOSIS — I82403 Acute embolism and thrombosis of unspecified deep veins of lower extremity, bilateral: Secondary | ICD-10-CM | POA: Diagnosis not present

## 2019-06-14 LAB — CBG MONITORING, ED: Glucose-Capillary: 27 mg/dL — CL (ref 70–99)

## 2019-06-20 DIAGNOSIS — I82403 Acute embolism and thrombosis of unspecified deep veins of lower extremity, bilateral: Secondary | ICD-10-CM | POA: Diagnosis not present

## 2019-06-20 DIAGNOSIS — D631 Anemia in chronic kidney disease: Secondary | ICD-10-CM | POA: Diagnosis not present

## 2019-06-20 DIAGNOSIS — I2694 Multiple subsegmental pulmonary emboli without acute cor pulmonale: Secondary | ICD-10-CM | POA: Diagnosis not present

## 2019-06-20 DIAGNOSIS — E0821 Diabetes mellitus due to underlying condition with diabetic nephropathy: Secondary | ICD-10-CM | POA: Diagnosis not present

## 2019-06-25 ENCOUNTER — Telehealth: Payer: Self-pay | Admitting: *Deleted

## 2019-06-25 DIAGNOSIS — R4189 Other symptoms and signs involving cognitive functions and awareness: Secondary | ICD-10-CM | POA: Diagnosis not present

## 2019-06-25 DIAGNOSIS — I959 Hypotension, unspecified: Secondary | ICD-10-CM | POA: Diagnosis not present

## 2019-06-25 DIAGNOSIS — D631 Anemia in chronic kidney disease: Secondary | ICD-10-CM | POA: Diagnosis not present

## 2019-06-25 DIAGNOSIS — N179 Acute kidney failure, unspecified: Secondary | ICD-10-CM | POA: Diagnosis not present

## 2019-06-25 NOTE — Telephone Encounter (Signed)
Patient daughter called and stated that you were faxing an order for a Hospital bed and she was checking on the status. Daughter would like for you to call her regarding this. Stated that this was discussed at the last appointment.  Forwarded to [employer]

## 2019-06-26 NOTE — Telephone Encounter (Signed)
Provider routed back to me with no instructions. Still waiting for new orders to be placed for patient.

## 2019-06-26 NOTE — Telephone Encounter (Signed)
Patients daughter called again to check status and questions why order was sent to [facility] in the first place. Patients daughter states please send hospital bed order to anyplace that will provide other than [facility].   [name] states her father will be home on Monday 07/02/2019 and she needs the bed before then to prevent her father from falling out of the bed.  [name] is requesting a return call once complete  [facility] aware I am forwarding message to [facility] whom seen patient and her medical assistant Melissa to further address

## 2019-06-26 NOTE — Telephone Encounter (Signed)
Order for DME hospital bed placed and faxed on last visit.please contact patient's [facility] health to determine progress of the hospital bed.

## 2019-06-26 NOTE — Telephone Encounter (Signed)
[name] will send DME to another Home health Agency then notify patient's daughter up date.

## 2019-06-26 NOTE — Telephone Encounter (Signed)
Called [facility] at home and they stated orders that were faxed were declined at this time due to staffing. They currently having staffing issues. Also informed that they do not accept orders for equipment. Routing to provider.

## 2019-06-27 DIAGNOSIS — E1121 Type 2 diabetes mellitus with diabetic nephropathy: Secondary | ICD-10-CM | POA: Diagnosis not present

## 2019-06-27 DIAGNOSIS — Z794 Long term (current) use of insulin: Secondary | ICD-10-CM | POA: Diagnosis not present

## 2019-07-02 NOTE — Addendum Note (Signed)
Addended byMarlowe Sax C on: 07/02/2019 10:33 AM   Modules accepted: Orders

## 2019-07-02 NOTE — Addendum Note (Signed)
Addended byMarlowe Sax C on: 07/02/2019 10:41 AM   Modules accepted: Orders

## 2019-07-03 DIAGNOSIS — I2694 Multiple subsegmental pulmonary emboli without acute cor pulmonale: Secondary | ICD-10-CM | POA: Diagnosis not present

## 2019-07-03 DIAGNOSIS — N179 Acute kidney failure, unspecified: Secondary | ICD-10-CM | POA: Diagnosis not present

## 2019-07-03 DIAGNOSIS — I130 Hypertensive heart and chronic kidney disease with heart failure and stage 1 through stage 4 chronic kidney disease, or unspecified chronic kidney disease: Secondary | ICD-10-CM | POA: Diagnosis not present

## 2019-07-03 DIAGNOSIS — I82403 Acute embolism and thrombosis of unspecified deep veins of lower extremity, bilateral: Secondary | ICD-10-CM | POA: Diagnosis not present

## 2019-07-04 DIAGNOSIS — N1832 Chronic kidney disease, stage 3b: Secondary | ICD-10-CM

## 2019-07-04 DIAGNOSIS — I130 Hypertensive heart and chronic kidney disease with heart failure and stage 1 through stage 4 chronic kidney disease, or unspecified chronic kidney disease: Secondary | ICD-10-CM

## 2019-07-04 DIAGNOSIS — I5032 Chronic diastolic (congestive) heart failure: Secondary | ICD-10-CM

## 2019-07-04 DIAGNOSIS — E1122 Type 2 diabetes mellitus with diabetic chronic kidney disease: Secondary | ICD-10-CM

## 2019-07-04 DIAGNOSIS — I2694 Multiple subsegmental pulmonary emboli without acute cor pulmonale: Secondary | ICD-10-CM | POA: Diagnosis not present

## 2019-07-04 DIAGNOSIS — F0281 Dementia in other diseases classified elsewhere with behavioral disturbance: Secondary | ICD-10-CM | POA: Diagnosis not present

## 2019-07-04 DIAGNOSIS — D631 Anemia in chronic kidney disease: Secondary | ICD-10-CM | POA: Diagnosis not present

## 2019-07-04 DIAGNOSIS — I82403 Acute embolism and thrombosis of unspecified deep veins of lower extremity, bilateral: Secondary | ICD-10-CM | POA: Diagnosis not present

## 2019-07-04 DIAGNOSIS — G301 Alzheimer's disease with late onset: Secondary | ICD-10-CM | POA: Diagnosis not present

## 2019-07-05 ENCOUNTER — Other Ambulatory Visit: Payer: Self-pay

## 2019-07-05 ENCOUNTER — Encounter (HOSPITAL_COMMUNITY): Payer: Self-pay

## 2019-07-05 ENCOUNTER — Inpatient Hospital Stay (HOSPITAL_COMMUNITY)
Admission: EM | Admit: 2019-07-05 | Discharge: 2019-07-13 | DRG: 177 | Disposition: A | Discharge status: Home Health | Payer: Medicare HMO | Attending: Internal Medicine | Admitting: Internal Medicine

## 2019-07-05 ENCOUNTER — Emergency Department (HOSPITAL_COMMUNITY): Payer: Medicare HMO

## 2019-07-05 ENCOUNTER — Ambulatory Visit (INDEPENDENT_AMBULATORY_CARE_PROVIDER_SITE_OTHER): Payer: Medicare HMO | Admitting: Nurse Practitioner

## 2019-07-05 VITALS — Temp 96.1°F

## 2019-07-05 DIAGNOSIS — R0602 Shortness of breath: Secondary | ICD-10-CM | POA: Diagnosis not present

## 2019-07-05 DIAGNOSIS — N179 Acute kidney failure, unspecified: Secondary | ICD-10-CM | POA: Diagnosis present

## 2019-07-05 DIAGNOSIS — Z955 Presence of coronary angioplasty implant and graft: Secondary | ICD-10-CM | POA: Diagnosis not present

## 2019-07-05 DIAGNOSIS — Z7982 Long term (current) use of aspirin: Secondary | ICD-10-CM

## 2019-07-05 DIAGNOSIS — I10 Essential (primary) hypertension: Secondary | ICD-10-CM | POA: Diagnosis present

## 2019-07-05 DIAGNOSIS — I959 Hypotension, unspecified: Secondary | ICD-10-CM | POA: Diagnosis not present

## 2019-07-05 DIAGNOSIS — I5032 Chronic diastolic (congestive) heart failure: Secondary | ICD-10-CM | POA: Diagnosis present

## 2019-07-05 DIAGNOSIS — J9601 Acute respiratory failure with hypoxia: Secondary | ICD-10-CM | POA: Diagnosis not present

## 2019-07-05 DIAGNOSIS — J1282 Pneumonia due to coronavirus disease 2019: Secondary | ICD-10-CM | POA: Diagnosis present

## 2019-07-05 DIAGNOSIS — R296 Repeated falls: Secondary | ICD-10-CM | POA: Diagnosis present

## 2019-07-05 DIAGNOSIS — Z7901 Long term (current) use of anticoagulants: Secondary | ICD-10-CM | POA: Diagnosis not present

## 2019-07-05 DIAGNOSIS — G309 Alzheimer's disease, unspecified: Secondary | ICD-10-CM | POA: Diagnosis present

## 2019-07-05 DIAGNOSIS — Z833 Family history of diabetes mellitus: Secondary | ICD-10-CM | POA: Diagnosis not present

## 2019-07-05 DIAGNOSIS — Z823 Family history of stroke: Secondary | ICD-10-CM | POA: Diagnosis not present

## 2019-07-05 DIAGNOSIS — R2689 Other abnormalities of gait and mobility: Secondary | ICD-10-CM | POA: Diagnosis not present

## 2019-07-05 DIAGNOSIS — M2550 Pain in unspecified joint: Secondary | ICD-10-CM | POA: Diagnosis not present

## 2019-07-05 DIAGNOSIS — I2510 Atherosclerotic heart disease of native coronary artery without angina pectoris: Secondary | ICD-10-CM | POA: Diagnosis present

## 2019-07-05 DIAGNOSIS — F0281 Dementia in other diseases classified elsewhere with behavioral disturbance: Secondary | ICD-10-CM | POA: Diagnosis not present

## 2019-07-05 DIAGNOSIS — I25119 Atherosclerotic heart disease of native coronary artery with unspecified angina pectoris: Secondary | ICD-10-CM | POA: Diagnosis present

## 2019-07-05 DIAGNOSIS — Z794 Long term (current) use of insulin: Secondary | ICD-10-CM | POA: Diagnosis not present

## 2019-07-05 DIAGNOSIS — E1151 Type 2 diabetes mellitus with diabetic peripheral angiopathy without gangrene: Secondary | ICD-10-CM | POA: Diagnosis not present

## 2019-07-05 DIAGNOSIS — Z86711 Personal history of pulmonary embolism: Secondary | ICD-10-CM | POA: Diagnosis not present

## 2019-07-05 DIAGNOSIS — U071 COVID-19: Secondary | ICD-10-CM

## 2019-07-05 DIAGNOSIS — Z87891 Personal history of nicotine dependence: Secondary | ICD-10-CM

## 2019-07-05 DIAGNOSIS — E785 Hyperlipidemia, unspecified: Secondary | ICD-10-CM | POA: Diagnosis present

## 2019-07-05 DIAGNOSIS — R0902 Hypoxemia: Secondary | ICD-10-CM | POA: Diagnosis not present

## 2019-07-05 DIAGNOSIS — Z8673 Personal history of transient ischemic attack (TIA), and cerebral infarction without residual deficits: Secondary | ICD-10-CM

## 2019-07-05 DIAGNOSIS — R29898 Other symptoms and signs involving the musculoskeletal system: Secondary | ICD-10-CM | POA: Diagnosis not present

## 2019-07-05 DIAGNOSIS — I110 Hypertensive heart disease with heart failure: Secondary | ICD-10-CM | POA: Diagnosis present

## 2019-07-05 DIAGNOSIS — F03B18 Unspecified dementia, moderate, with other behavioral disturbance: Secondary | ICD-10-CM | POA: Diagnosis present

## 2019-07-05 DIAGNOSIS — R262 Difficulty in walking, not elsewhere classified: Secondary | ICD-10-CM | POA: Diagnosis not present

## 2019-07-05 DIAGNOSIS — Z79899 Other long term (current) drug therapy: Secondary | ICD-10-CM

## 2019-07-05 DIAGNOSIS — J1289 Other viral pneumonia: Secondary | ICD-10-CM | POA: Diagnosis present

## 2019-07-05 DIAGNOSIS — J069 Acute upper respiratory infection, unspecified: Secondary | ICD-10-CM | POA: Diagnosis not present

## 2019-07-05 DIAGNOSIS — Z7401 Bed confinement status: Secondary | ICD-10-CM | POA: Diagnosis not present

## 2019-07-05 DIAGNOSIS — F0391 Unspecified dementia with behavioral disturbance: Secondary | ICD-10-CM | POA: Diagnosis present

## 2019-07-05 DIAGNOSIS — I4510 Unspecified right bundle-branch block: Secondary | ICD-10-CM | POA: Diagnosis not present

## 2019-07-05 DIAGNOSIS — R4182 Altered mental status, unspecified: Secondary | ICD-10-CM | POA: Diagnosis not present

## 2019-07-05 LAB — CBC WITH DIFFERENTIAL/PLATELET
Abs Immature Granulocytes: 0.01 10*3/uL (ref 0.00–0.07)
Basophils Absolute: 0 10*3/uL (ref 0.0–0.1)
Basophils Relative: 0 %
Eosinophils Absolute: 0.1 10*3/uL (ref 0.0–0.5)
Eosinophils Relative: 3 %
HCT: 31.4 % — ABNORMAL LOW (ref 39.0–52.0)
Hemoglobin: 9.7 g/dL — ABNORMAL LOW (ref 13.0–17.0)
Immature Granulocytes: 0 %
Lymphocytes Relative: 24 %
Lymphs Abs: 0.9 10*3/uL (ref 0.7–4.0)
MCH: 29.8 pg (ref 26.0–34.0)
MCHC: 30.9 g/dL (ref 30.0–36.0)
MCV: 96.3 fL (ref 80.0–100.0)
Monocytes Absolute: 0.5 10*3/uL (ref 0.1–1.0)
Monocytes Relative: 13 %
Neutro Abs: 2.2 10*3/uL (ref 1.7–7.7)
Neutrophils Relative %: 60 %
Platelets: 170 10*3/uL (ref 150–400)
RBC: 3.26 MIL/uL — ABNORMAL LOW (ref 4.22–5.81)
RDW: 13.2 % (ref 11.5–15.5)
WBC: 3.8 10*3/uL — ABNORMAL LOW (ref 4.0–10.5)
nRBC: 0 % (ref 0.0–0.2)

## 2019-07-05 LAB — POC SARS CORONAVIRUS 2 AG -  ED: SARS Coronavirus 2 Ag: POSITIVE — AB

## 2019-07-05 LAB — COMPREHENSIVE METABOLIC PANEL
ALT: 31 U/L (ref 0–44)
AST: 32 U/L (ref 15–41)
Albumin: 3.1 g/dL — ABNORMAL LOW (ref 3.5–5.0)
Alkaline Phosphatase: 68 U/L (ref 38–126)
Anion gap: 9 (ref 5–15)
BUN: 30 mg/dL — ABNORMAL HIGH (ref 8–23)
CO2: 29 mmol/L (ref 22–32)
Calcium: 8.9 mg/dL (ref 8.9–10.3)
Chloride: 104 mmol/L (ref 98–111)
Creatinine, Ser: 1.48 mg/dL — ABNORMAL HIGH (ref 0.61–1.24)
GFR calc Af Amer: 46 mL/min — ABNORMAL LOW (ref >60)
GFR calc non Af Amer: 40 mL/min — ABNORMAL LOW (ref >60)
Glucose, Bld: 242 mg/dL — ABNORMAL HIGH (ref 70–99)
Potassium: 4.3 mmol/L (ref 3.5–5.1)
Sodium: 142 mmol/L (ref 135–145)
Total Bilirubin: 0.3 mg/dL (ref 0.3–1.2)
Total Protein: 6.3 g/dL — ABNORMAL LOW (ref 6.5–8.1)

## 2019-07-05 LAB — C-REACTIVE PROTEIN: CRP: 4.4 mg/dL — ABNORMAL HIGH (ref <1.0)

## 2019-07-05 LAB — TRIGLYCERIDES: Triglycerides: 126 mg/dL (ref <150)

## 2019-07-05 LAB — FIBRINOGEN: Fibrinogen: 584 mg/dL — ABNORMAL HIGH (ref 210–475)

## 2019-07-05 LAB — LACTATE DEHYDROGENASE: LDH: 196 U/L — ABNORMAL HIGH (ref 98–192)

## 2019-07-05 LAB — D-DIMER, QUANTITATIVE: D-Dimer, Quant: 3.92 ug/mL-FEU — ABNORMAL HIGH (ref 0.00–0.50)

## 2019-07-05 LAB — PROCALCITONIN: Procalcitonin: <0.1 ng/mL

## 2019-07-05 LAB — FERRITIN: Ferritin: 824 ng/mL — ABNORMAL HIGH (ref 24–336)

## 2019-07-05 LAB — LACTIC ACID, PLASMA: Lactic Acid, Venous: 1.9 mmol/L (ref 0.5–1.9)

## 2019-07-05 MED ORDER — SODIUM CHLORIDE 0.9 % IV SOLN
100.0000 mg | INTRAVENOUS | Status: AC
  Administered 2019-07-06 – 2019-07-09 (×4): 100 mg via INTRAVENOUS
  Filled 2019-07-05 (×5): qty 20

## 2019-07-05 MED ORDER — ONDANSETRON HCL 4 MG PO TABS: 4.0000 mg | ORAL_TABLET | Freq: Four times a day (QID) | ORAL | Status: DC | PRN

## 2019-07-05 MED ORDER — ALBUTEROL SULFATE HFA 108 (90 BASE) MCG/ACT IN AERS
2.0000 | INHALATION_SPRAY | Freq: Four times a day (QID) | RESPIRATORY_TRACT | Status: DC
  Administered 2019-07-06 – 2019-07-13 (×24): 2 via RESPIRATORY_TRACT
  Filled 2019-07-05: qty 6.7

## 2019-07-05 MED ORDER — INSULIN ASPART 100 UNIT/ML ~~LOC~~ SOLN
0.0000 [IU] | Freq: Every day | SUBCUTANEOUS | Status: DC
  Administered 2019-07-08 – 2019-07-12 (×3): 3 [IU] via SUBCUTANEOUS

## 2019-07-05 MED ORDER — ACETAMINOPHEN 325 MG PO TABS: 650.0000 mg | ORAL_TABLET | Freq: Four times a day (QID) | ORAL | Status: DC | PRN

## 2019-07-05 MED ORDER — SODIUM CHLORIDE 0.9 % IV SOLN
500.0000 mg | INTRAVENOUS | Status: DC
  Administered 2019-07-05 – 2019-07-06 (×2): 500 mg via INTRAVENOUS
  Filled 2019-07-05 (×2): qty 500

## 2019-07-05 MED ORDER — GUAIFENESIN-DM 100-10 MG/5ML PO SYRP
10.0000 mL | ORAL_SOLUTION | ORAL | Status: DC | PRN
  Administered 2019-07-06: 10 mL via ORAL
  Filled 2019-07-05: qty 10

## 2019-07-05 MED ORDER — SODIUM CHLORIDE 0.9 % IV SOLN
INTRAVENOUS | Status: DC
  Administered 2019-07-05: 22:00:00 via INTRAVENOUS

## 2019-07-05 MED ORDER — HYDROCOD POLST-CPM POLST ER 10-8 MG/5ML PO SUER: 5.0000 mL | Freq: Two times a day (BID) | ORAL | Status: DC | PRN

## 2019-07-05 MED ORDER — DEXAMETHASONE SODIUM PHOSPHATE 10 MG/ML IJ SOLN
6.0000 mg | INTRAMUSCULAR | Status: DC
  Administered 2019-07-05 – 2019-07-11 (×7): 6 mg via INTRAVENOUS
  Filled 2019-07-05 (×7): qty 1

## 2019-07-05 MED ORDER — APIXABAN 5 MG PO TABS
5.0000 mg | ORAL_TABLET | Freq: Two times a day (BID) | ORAL | Status: DC
  Administered 2019-07-06 – 2019-07-13 (×15): 5 mg via ORAL
  Filled 2019-07-05 (×18): qty 1

## 2019-07-05 MED ORDER — SODIUM CHLORIDE 0.9 % IV SOLN
200.0000 mg | Freq: Once | INTRAVENOUS | Status: AC
  Administered 2019-07-05: 200 mg via INTRAVENOUS
  Filled 2019-07-05: qty 40

## 2019-07-05 MED ORDER — ONDANSETRON HCL 4 MG/2ML IJ SOLN: 4.0000 mg | Freq: Four times a day (QID) | INTRAMUSCULAR | Status: DC | PRN

## 2019-07-05 MED ORDER — INSULIN ASPART 100 UNIT/ML ~~LOC~~ SOLN
0.0000 [IU] | Freq: Three times a day (TID) | SUBCUTANEOUS | Status: DC
  Administered 2019-07-06: 1 [IU] via SUBCUTANEOUS
  Administered 2019-07-07: 3 [IU] via SUBCUTANEOUS
  Administered 2019-07-07 – 2019-07-08 (×3): 2 [IU] via SUBCUTANEOUS
  Administered 2019-07-08: 3 [IU] via SUBCUTANEOUS
  Administered 2019-07-08 – 2019-07-09 (×2): 1 [IU] via SUBCUTANEOUS
  Administered 2019-07-09 – 2019-07-10 (×2): 3 [IU] via SUBCUTANEOUS
  Administered 2019-07-10: 1 [IU] via SUBCUTANEOUS
  Administered 2019-07-11: 2 [IU] via SUBCUTANEOUS
  Administered 2019-07-11: 1 [IU] via SUBCUTANEOUS
  Administered 2019-07-11: 2 [IU] via SUBCUTANEOUS
  Administered 2019-07-12: 1 [IU] via SUBCUTANEOUS
  Administered 2019-07-12: 3 [IU] via SUBCUTANEOUS
  Administered 2019-07-13: 5 [IU] via SUBCUTANEOUS
  Administered 2019-07-13 (×2): 3 [IU] via SUBCUTANEOUS

## 2019-07-05 NOTE — ED Notes (Signed)
Pt SPO2 91-93% on RA, pt placed on 2 lpm o2 via [place]

## 2019-07-05 NOTE — ED Provider Notes (Addendum)
[facility] EMERGENCY DEPARTMENT Provider Note   CSN: [id number] Arrival date & time: 07/05/19  1532     History   Chief Complaint Chief Complaint  Patient presents with  . covid symptoms    HPI [name] is a 85 y.o. male with a past medical history of CAD, dementia, hyperlipidemia, hyperglycemia, type 2 diabetes.  There is a level 5 caveat due to dementia.  History is gathered from EMS at bedside, and review of EMR.  Patient was discharged from [facility] on December 1 after admission for rehab from recent stroke.  The daughter reports EMS that he has had 3 days of cough while he has been in her home.  She got a call from [place] telling her that her father had tested positive for coronavirus.  She had a televisit with geriatric medicine today states that she just "does not know what to do" with her dad.  EMS reports that the patient was hypoxic to 80% on room air upon arrival.    HPI  Past Medical History:  Diagnosis Date  . Arthritis   . CAD (coronary artery disease)    s/p DES to LAD 06/2013  . Dementia with behavioral disturbance ([place])   . Depression   . Dyslipidemia   . Glaucoma   . Hyperglycemia 04/17/2014  . Hyperlipidemia   . Hypertension   . Stroke ([place])   . Type 2 diabetes mellitus [facility])     Patient Active Problem List   Diagnosis Date Noted  . Pulmonary embolism ([place]) 06/01/2019  . Acute renal failure superimposed on stage 3 chronic kidney disease ([place]) 06/01/2019  . Chronic diastolic CHF (congestive heart failure) ([place]) 06/01/2019  . Pain due to onychomycosis of toenails of both feet 01/23/2019  . Bilateral lower extremity edema 07/05/2017  . Pulmonary nodules 04/13/2017  . Centrilobular emphysema ([place]) 04/13/2017  . Calculus of gallbladder without cholecystitis without obstruction 04/13/2017  . Chronic cough 04/13/2017  . Adenoma of left adrenal gland 04/13/2017  . Frequent falls 04/01/2017  . Type 2 diabetes mellitus with  stage 3 chronic kidney disease, with long-term current use of insulin ([place]) 04/01/2017  . History of tobacco abuse 04/01/2017  . Unsteady gait 04/01/2017  . Dysarthria 04/01/2017  . History of stroke   . Hyperlipidemia   . Moderate dementia with behavioral disturbance ([place]) 09/19/2015  . Depression 09/19/2015  . Hypertensive heart disease 09/10/2015  . Type 2 diabetes, controlled, with peripheral circulatory disorder ([place])   . Essential hypertension   . Coronary artery disease involving native coronary artery of native heart with angina pectoris ([place])   . Arthritis   . Glaucoma     Past Surgical History:  Procedure Laterality Date  . CARDIAC CATHETERIZATION  06/2013   DES to LAD by Dr Tamala Julian  . Right Cataract          Home Medications    Prior to Admission medications   Medication Sig Start Date End Date Taking? Authorizing Provider  albuterol (VENTOLIN HFA) 108 (90 Base) MCG/ACT inhaler Inhale 2 puffs into the lungs every 4 (four) hours as needed for wheezing or shortness of breath. INHALE TWO PUFFS EVERY 4-6 HOURS AS NEEDED FOR DIFFICULTY BREATHING,WHEEZING OR COUGH 06/13/19   Blanchie Dessert, MD  Alcohol Swabs (B-D SINGLE USE SWABS REGULAR) PADS USE AS DIRECTED WITH CHECKING BLOOD SUGAR THREE TIMES DAILY 09/07/17   Gildardo Cranker, DO  amLODipine (NORVASC) 5 MG tablet Take 1 tablet (5 mg total) by mouth  daily. 06/13/19   Blanchie Dessert, MD  apixaban (ELIQUIS) 5 MG TABS tablet Take 5 mg by mouth 2 (two) times daily.    [provider]  aspirin EC 81 MG tablet Take 1 tablet (81 mg total) by mouth daily. 06/13/19   Blanchie Dessert, MD  Blood Glucose Calibration (ACCU-CHEK AVIVA) SOLN Use as Directed. Dx: E11.51 04/20/16   Gildardo Cranker, DO  escitalopram (LEXAPRO) 5 MG tablet Take 1 tablet (5 mg total) by mouth daily. 06/13/19   Blanchie Dessert, MD  glucose blood (ACCU-CHEK AVIVA) test strip Use to test blood sugar three times daily. Dx: E11.51 08/31/18   Ngetich,  Dinah C, NP  hydrochlorothiazide (HYDRODIURIL) 25 MG tablet Take 1 tablet (25 mg total) by mouth daily. 06/13/19   Blanchie Dessert, MD  insulin glargine (LANTUS) 100 UNIT/ML injection Inject 7 Units into the skin daily.    [provider]  Insulin Pen Needle (B-D UF III MINI PEN NEEDLES) 31G X 5 MM MISC USE AS DIRECTED. 08/31/18   Ngetich, Dinah C, NP  Lancet Devices (ACCU-CHEK SOFTCLIX) lancets Use as instructed with checking blood sugar three times daily. Dx: E11.51 04/01/17   Gildardo Cranker, DO  Loratadine 10 MG CAPS Take 1 capsule (10 mg total) by mouth daily. 06/13/19   Blanchie Dessert, MD  losartan (COZAAR) 50 MG tablet Take 1 tablet (50 mg total) by mouth daily. 06/13/19   Blanchie Dessert, MD  Memantine HCl-Donepezil HCl (NAMZARIC) 28-10 MG CP24 Take 1 capsule by mouth daily. 06/13/19   Blanchie Dessert, MD  mineral oil-hydrophilic petrolatum (AQUAPHOR) ointment Apply topically 2 (two) times daily. To legs 04/11/19   Ngetich, Dinah C, NP  Multiple Vitamins-Minerals (SENTRY SENIOR) TABS Take 1 tablet by mouth daily. 06/13/19   Blanchie Dessert, MD  naproxen sodium (ALEVE) 220 MG tablet Take 440 mg by mouth 2 (two) times daily as needed (headache/pain).    [provider]  simvastatin (ZOCOR) 20 MG tablet Take 1 tablet (20 mg total) by mouth daily. 06/13/19   Blanchie Dessert, MD  tamsulosin (FLOMAX) 0.4 MG CAPS capsule Take 1 capsule (0.4 mg total) by mouth daily after supper. 06/13/19   Blanchie Dessert, MD    Family History Family History  Problem Relation Age of Onset  . Arthritis Mother   . Breast cancer Mother   . Arthritis Father   . Dementia Father   . Diabetes Other   . Arthritis Other   . Colon cancer Other   . Stroke Other   . Diabetes type II Sister   . Suicidality Brother   . Diabetes type I Brother   . Diabetes type I Brother   . Diabetes type I Brother   . Diabetes type II Sister   . Breast cancer Daughter   . Emphysema Daughter      Social History Social History   Tobacco Use  . Smoking status: Former Smoker    Types: Cigarettes    Quit date: 08/02/1968    Years since quitting: 50.9  . Smokeless tobacco: Never Used  Substance Use Topics  . Alcohol use: No    Alcohol/week: 0.0 standard drinks  . Drug use: No     Allergies   Lisinopril   Review of Systems Review of Systems Ten systems reviewed and are negative for acute change, except as noted in the HPI.    Physical Exam Updated Vital Signs BP 133/62   Pulse 96   Temp 99.7 F (37.6 C) (Oral)   Resp 20  SpO2 93%   Physical Exam Vitals signs and nursing note reviewed.  Constitutional:      General: He is not in acute distress.    Appearance: He is well-developed. He is not diaphoretic.  HENT:     Head: Normocephalic and atraumatic.  Eyes:     General: No scleral icterus.    Conjunctiva/sclera: Conjunctivae normal.  Neck:     Musculoskeletal: Normal range of motion and neck supple.  Cardiovascular:     Rate and Rhythm: Normal rate and regular rhythm.     Heart sounds: Normal heart sounds.  Pulmonary:     Effort: Pulmonary effort is normal. No respiratory distress.     Breath sounds: Rhonchi present.  Abdominal:     General: There is no distension.     Palpations: Abdomen is soft.     Tenderness: There is no abdominal tenderness.  Skin:    General: Skin is warm and dry.  Neurological:     Mental Status: He is lethargic.     GCS: GCS eye subscore is 3.  Psychiatric:        Behavior: Behavior normal.      ED Treatments / Results  Labs (all labs ordered are listed, but only abnormal results are displayed) Labs Reviewed  CBC WITH DIFFERENTIAL/PLATELET - Abnormal; Notable for the following components:      Result Value   WBC 3.8 (*)    RBC 3.26 (*)    Hemoglobin 9.7 (*)    HCT 31.4 (*)    All other components within normal limits  D-DIMER, QUANTITATIVE (NOT AT [facility]) - Abnormal; Notable for the following components:    D-Dimer, Quant 3.92 (*)    All other components within normal limits  FIBRINOGEN - Abnormal; Notable for the following components:   Fibrinogen 584 (*)    All other components within normal limits  POC SARS CORONAVIRUS 2 AG -  ED - Abnormal; Notable for the following components:   SARS Coronavirus 2 Ag POSITIVE (*)    All other components within normal limits  CULTURE, BLOOD (ROUTINE X 2)  CULTURE, BLOOD (ROUTINE X 2)  LACTIC ACID, PLASMA  LACTIC ACID, PLASMA  COMPREHENSIVE METABOLIC PANEL  PROCALCITONIN  LACTATE DEHYDROGENASE  FERRITIN  TRIGLYCERIDES  C-REACTIVE PROTEIN    EKG EKG Interpretation  Date/Time:  Thursday July 05 2019 16:16:26 EST Ventricular Rate:  93 PR Interval:    QRS Duration: 134 QT Interval:  433 QTC Calculation: 539 R Axis:   76 Text Interpretation: Sinus rhythm Right bundle branch block Confirmed by Davonna Belling [phone]) on 07/05/2019 4:28:11 PM   Radiology Dg Chest Port 1 View  Result Date: 07/05/2019 CLINICAL DATA:  Shortness of breath. EXAM: PORTABLE CHEST 1 VIEW COMPARISON:  April 12, 2019. FINDINGS: The heart size and mediastinal contours are within normal limits. No pneumothorax or pleural effusion is noted. Mild right midlung opacity is noted concerning for atelectasis or infiltrate. Probable left basilar subsegmental atelectasis is noted. The visualized skeletal structures are unremarkable. IMPRESSION: Mild right midlung opacity is noted concerning for atelectasis or infiltrate. Probable left basilar subsegmental atelectasis is noted. Followup radiographs are recommended until resolution. Aortic Atherosclerosis (ICD10-I70.0). Electronically Signed   By: Marijo Conception M.D.   On: 07/05/2019 16:54    Procedures .Critical Care Performed by: Margarita Mail, PA-C Authorized by: Margarita Mail, PA-C   Critical care provider statement:    Critical care time (minutes):  40   Critical care time was exclusive of:  Separately  billable  procedures and treating other patients and teaching time   Critical care was necessary to treat or prevent imminent or life-threatening deterioration of the following conditions:  Respiratory failure   Critical care was time spent personally by me on the following activities:  Discussions with consultants, evaluation of patient's response to treatment, examination of patient, ordering and performing treatments and interventions, ordering and review of laboratory studies, ordering and review of radiographic studies, pulse oximetry, re-evaluation of patient's condition, obtaining history from patient or surrogate, review of old charts and development of treatment plan with patient or surrogate   (including critical care time)  Medications Ordered in ED Medications - No data to display   Initial Impression / Assessment and Plan / ED Course  I have reviewed the triage vital signs and the nursing notes.  Pertinent labs & imaging results that were available during my care of the patient were reviewed by me and considered in my medical decision making (see chart for details).   CC:+ covid/ Hypoxia VS:  Vitals:   07/05/19 1700 07/05/19 1715 07/05/19 1730 07/05/19 1745  BP: (!) 115/56 (!) 122/59 (!) 106/58 129/61  Pulse: 89 77 76 77  Resp: 13 16 15 15   Temp:      TempSrc:      SpO2: 100% 100% 100% 100%   [id number] is gathered by EMS and EMR. DDX:The emergent differential diagnosis for shortness of breath includes, but is not limited to, Pulmonary edema, bronchoconstriction, Pneumonia, Pulmonary embolism, Pneumotherax/ Hemothorax, Dysrythmia, ACS.  Labs: I reviewed the labs which show +covid, leukopenia and normocytic anemia, elevated blood sugar, baseline renal insufficiency. Elevated inflammatory markers. Imaging: I personally reviewed the images (1 v cxr) which show(s) right lung opacity on my interpretation EKG: NSR / RBBB rate of 93 [id number] with hypoxic respiratory failure secondary  to covid 19 infection. He is resting comfortably.  02 sats above 90 on 2 L. Patient disposition:admit Patient condition: . The patient appears reasonably stabilized for admission considering the current resources, flow, and capabilities available in the ED at this time, and I doubt any other [facility] requiring further screening and/or treatment in the ED prior to admission.  Discussed code status with patient's daughter who expresses desire to make him FULL CODE.  [name] was evaluated in Emergency Department on 07/05/2019 for the symptoms described in the history of present illness. He was evaluated in the context of the global COVID-19 pandemic, which necessitated consideration that the patient might be at risk for infection with the SARS-CoV-2 virus that causes COVID-19. Institutional protocols and algorithms that pertain to the evaluation of patients at risk for COVID-19 are in a state of rapid change based on information released by regulatory bodies including the CDC and federal and state organizations. These policies and algorithms were followed during the patient's care in the ED.   Final Clinical Impressions(s) / ED Diagnoses   Final diagnoses:  None    ED Discharge Orders    None       Margarita Mail, PA-C 07/05/19 2230    Margarita Mail, PA-C 07/05/19 2243    Davonna Belling, MD 07/05/19 2320

## 2019-07-05 NOTE — ED Triage Notes (Signed)
Pt bib [facility] after pt tested positive at [place] rehab during his stroke rehab. Pt was sent home for completed rehab and then rehab called him at home and told him he had tested positive. Per EMS pt's spo2 was 80% on room air on arrival and pt had fever of 101.7 but refused tylenol for EMS. Pt at neuro baseline per family.  BP 120/58 HR 96 TEMP 101.7 F  SPO2 100% on 6 lpm NRB RR 18

## 2019-07-05 NOTE — Progress Notes (Signed)
This service is provided via telemedicine  No vital signs collected/recorded due to the encounter was a telemedicine visit.   Location of patient (ex: home, work):  home  Patient consents to a telephone visit:  Yes  Location of the provider (ex: office, home):  [employer] of all persons participating in the telemedicine service and their role in the encounter:  Daughter, [name] and Pt, [name] and Janett Billow Jordie Skalsky np     Careteam: Patient Care Team: Gayland Curry, DO as PCP - General (Geriatric Medicine) Macarthur Critchley, [place] as Referring Physician (Optometry) Delice Lesch Lezlie Octave, MD as Consulting Physician (Neurology) Jon Billings, RN as [place] Management  Advanced Directive information    Allergies  Allergen Reactions  . Lisinopril Cough    Chief Complaint  Patient presents with  . Acute Visit    discharged from SNF with COVID     HPI: Patient is a 85 y.o. male  Unable to connect virtually so telephone visit was done. Daughter reports she is on a "training with her job and just wants to get through this visit" she requested a televisit and states she "does not know what to do with my dad"  Pt was discharged from [facility] on Dec 1. Yesterday she got a call saying that that a person at the facility tested positive for COVID then today a manager called her saying his test results came back positive.   States he had a low grade fever yesterday. 99.1 now 96.1. he is coughing and congested. She is unsure if he is having any shortness of breath. Not very responsive since a fall and possible stroke. She is unable to move him at this time. Reports he has been coughing in her face and now she has a sore throat.   Review of Systems:  Review of Systems  Unable to perform ROS: Dementia  Constitutional: Positive for fever and malaise/fatigue.  HENT: Positive for congestion.   Respiratory: Positive for cough and sputum production.   provided by  daughter   Past Medical History:  Diagnosis Date  . Arthritis   . CAD (coronary artery disease)    s/p DES to LAD 06/2013  . Dementia with behavioral disturbance ([place])   . Depression   . Dyslipidemia   . Glaucoma   . Hyperglycemia 04/17/2014  . Hyperlipidemia   . Hypertension   . Stroke ([place])   . Type 2 diabetes mellitus ([place])    Past Surgical History:  Procedure Laterality Date  . CARDIAC CATHETERIZATION  06/2013   DES to LAD by Dr Tamala Julian  . Right Cataract     Social History:   reports that he quit smoking about 50 years ago. His smoking use included cigarettes. He has never used smokeless tobacco. He reports that he does not drink alcohol or use drugs.  Family History  Problem Relation Age of Onset  . Arthritis Mother   . Breast cancer Mother   . Arthritis Father   . Dementia Father   . Diabetes Other   . Arthritis Other   . Colon cancer Other   . Stroke Other   . Diabetes type II Sister   . Suicidality Brother   . Diabetes type I Brother   . Diabetes type I Brother   . Diabetes type I Brother   . Diabetes type II Sister   . Breast cancer Daughter   . Emphysema Daughter     Medications: Patient's Medications  New Prescriptions   No medications on file  Previous Medications   ALBUTEROL (VENTOLIN HFA) 108 (90 BASE) MCG/ACT INHALER    Inhale 2 puffs into the lungs every 4 (four) hours as needed for wheezing or shortness of breath. INHALE TWO PUFFS EVERY 4-6 HOURS AS NEEDED FOR DIFFICULTY BREATHING,WHEEZING OR COUGH   ALCOHOL SWABS (B-D SINGLE USE SWABS REGULAR) PADS    USE AS DIRECTED WITH CHECKING BLOOD SUGAR THREE TIMES DAILY   AMLODIPINE (NORVASC) 5 MG TABLET    Take 1 tablet (5 mg total) by mouth daily.   APIXABAN (ELIQUIS) 5 MG TABS TABLET    Take 5 mg by mouth 2 (two) times daily.   ASPIRIN EC 81 MG TABLET    Take 1 tablet (81 mg total) by mouth daily.   BLOOD GLUCOSE CALIBRATION (ACCU-CHEK AVIVA) SOLN    Use as Directed. Dx: E11.51   ESCITALOPRAM (LEXAPRO)  5 MG TABLET    Take 1 tablet (5 mg total) by mouth daily.   GLUCOSE BLOOD (ACCU-CHEK AVIVA) TEST STRIP    Use to test blood sugar three times daily. Dx: E11.51   HYDROCHLOROTHIAZIDE (HYDRODIURIL) 25 MG TABLET    Take 1 tablet (25 mg total) by mouth daily.   INSULIN GLARGINE (LANTUS) 100 UNIT/ML INJECTION    Inject 7 Units into the skin daily.   INSULIN PEN NEEDLE (B-D UF III MINI PEN NEEDLES) 31G X 5 MM MISC    USE AS DIRECTED.   LANCET DEVICES (ACCU-CHEK SOFTCLIX) LANCETS    Use as instructed with checking blood sugar three times daily. Dx: E11.51   LORATADINE 10 MG CAPS    Take 1 capsule (10 mg total) by mouth daily.   LOSARTAN (COZAAR) 50 MG TABLET    Take 1 tablet (50 mg total) by mouth daily.   MEMANTINE HCL-DONEPEZIL HCL (NAMZARIC) 28-10 MG CP24    Take 1 capsule by mouth daily.   MINERAL OIL-HYDROPHILIC PETROLATUM (AQUAPHOR) OINTMENT    Apply topically 2 (two) times daily. To legs   MULTIPLE VITAMINS-MINERALS (SENTRY SENIOR) TABS    Take 1 tablet by mouth daily.   NAPROXEN SODIUM (ALEVE) 220 MG TABLET    Take 440 mg by mouth 2 (two) times daily as needed (headache/pain).   SIMVASTATIN (ZOCOR) 20 MG TABLET    Take 1 tablet (20 mg total) by mouth daily.   TAMSULOSIN (FLOMAX) 0.4 MG CAPS CAPSULE    Take 1 capsule (0.4 mg total) by mouth daily after supper.  Modified Medications   No medications on file  Discontinued Medications   APIXABAN (ELIQUIS) 5 MG TABS TABLET    Take 2 tablets (10mg ) twice daily for 7 days, then 1 tablet (5mg ) twice daily   HALOPERIDOL (HALDOL) 5 MG TABLET    Take 1 tablet (5 mg total) by mouth every 6 (six) hours as needed for agitation. Give at dinnertime and at bedtime   INSULIN GLARGINE (LANTUS) 100 UNIT/ML SOLOSTAR PEN    Inject 7 Units into the skin daily.   INSULIN GLARGINE, 1 UNIT DIAL, (TOUJEO SOLOSTAR) 300 UNIT/ML SOPN    Inject 50 Units into the skin at bedtime.   TRIAMCINOLONE CREAM (KENALOG) 0.1 %    APPLY 1 APPLICATION OF CREAM TWICE DAILY FOR 14 DAYS TO  LOWER EXTRMITIES    Physical Exam:  Vitals:   07/05/19 1418  Temp: (!) 96.1 F (35.6 C)   There is no height or weight on file to calculate BMI. Wt Readings from Last 3 Encounters:  06/05/19 186 lb 1.1 oz (84.4 kg)  05/28/19 180 lb (81.6 kg)  01/22/19 175 lb 12.8 oz (79.7 kg)     Labs reviewed: Basic Metabolic Panel: Recent Labs    08/31/18 1034  06/01/19 2202  06/04/19 0541 06/05/19 0305 06/09/19 1549  NA 145   < >  --    < > 142 137 136  K 3.9   < >  --    < > 4.3 4.6 4.8  CL 103   < >  --    < > 103 102 98  CO2 35*   < >  --    < > 30 26 29   GLUCOSE 82   < >  --    < > 119* 259* 242*  BUN 16   < >  --    < > 29* 30* 34*  CREATININE 1.40*   < >  --    < > 1.65* 1.48* 1.53*  CALCIUM 9.8   < >  --    < > 8.7* 8.6* 9.1  TSH 1.46  --  1.527  --   --   --   --    < > = values in this interval not displayed.   Liver Function Tests: Recent Labs    05/28/19 0433 06/01/19 1921 06/09/19 1549  AST 41 166* 38  ALT 29 67* 38  ALKPHOS 61 50 51  BILITOT 0.5 1.7* 0.8  PROT 6.5 6.6 6.2*  ALBUMIN 3.8 3.6 2.9*   No results for input(s): LIPASE, AMYLASE in the last 8760 hours. Recent Labs    06/01/19 2202  AMMONIA 29   CBC: Recent Labs    05/28/19 0600 06/01/19 1921  06/04/19 0541 06/05/19 0305 06/09/19 1549  WBC 4.0 8.1   < > 5.7 5.9 5.8  NEUTROABS 2.2 5.0  --   --   --  3.5  HGB 10.4* 11.7*   < > 10.3* 9.1* 10.1*  HCT 33.3* 35.5*   < > 31.4* 28.9* 33.0*  MCV 97.9 94.2   < > 94.6 95.4 100.0  PLT 173 PLATELET CLUMPS NOTED ON SMEAR, UNABLE TO ESTIMATE   < > 196 184 184   < > = values in this interval not displayed.   Lipid Panel: Recent Labs    08/31/18 1034 12/01/18 1032 06/02/19 0250  CHOL 127 147 134  HDL 32* 37* 48  LDLCALC 74 89 74  TRIG 120 117 61  CHOLHDL 4.0 4.0 2.8   TSH: Recent Labs    08/31/18 1034 06/01/19 2202  TSH 1.46 1.527   A1C: Lab Results  Component Value Date   HGBA1C 6.3 (H) 06/02/2019     Assessment/Plan 1.  Telehealth encounter for confirmed COVID-19 -daughter advised she is unable to tell if his breathing or LOC has been altered.he has had low grade fever for the last 24 hours with increase cough and congestion. At high risk for for complications therefore recommended to call EMS (she can not move pt), to notify of the COVID positive test and have him transported to the hospital for further evaluation. Advised her to quarantine for 10 days- and anyone else exposed (she states she has already put a call into her doctor).    Carlos American. Harle Battiest  [facility] [phone]    Virtual Visit via Telephone Note  I connected with@ on 07/05/19 at  2:45 PM EST by telephone and verified that I am speaking with the  correct person using two identifiers.  Location: Patient: home Provider: [facility]   I discussed the limitations, risks, security and privacy concerns of performing an evaluation and management service by telephone and the availability of in person appointments. I also discussed with the patient that there may be a patient responsible charge related to this service. The patient expressed understanding and agreed to proceed.   I discussed the assessment and treatment plan with the patient. The patient was provided an opportunity to ask questions and all were answered. The patient agreed with the plan and demonstrated an understanding of the instructions.   The patient was advised to call back or seek an in-person evaluation if the symptoms worsen or if the condition fails to improve as anticipated.  I provided  minutes of non-face-to-face time during this encounter.  Carlos American. Harle Battiest Avs printed and mailed

## 2019-07-05 NOTE — H&P (Signed)
History and Physical   [name] [id number] DOB: [birth year] DOA: 07/05/2019  Referring MD/NP/PA: Margarita Mail, PA  PCP: Gayland Curry, DO   Patient coming from: Home  Chief Complaint: Shortness of breath and cough  HPI: [name] is a 85 y.o. male with medical history significant of diabetes, hypertension, coronary artery disease, hyperlipidemia, history of CVA, dementia, who presents with several days of shortness of breath, weakness.  Patient was recently at rehab facility over at [facility] where he apparently tested positive for the Covid 19 virus went home on December 1 but family were unaware.  He started having significant shortness of breath or cough 3 days ago and daughter noticed that.  She subsequently had a call from the facility given her results of his Covid test.  He appeared weak to her.  She was noticing him getting worse.  EMS was called and his oxygen saturation was 80% on room air.  He was brought to the ER where he was reevaluated.  Chest x-ray showed bilateral infiltrates.  Patient is now requiring 2 L of oxygen.  He is being admitted with COVID-19 pneumonia.  Denied any significant sputum.  Patient is poor historian due to his dementia..  ED Course: Temperature is 96.1 initially currently 99.7.  Blood pressure 106/58 pulse 96 respiratory 5 oxygen sats 93% on 2 L.  White count of 3.8, hemoglobin 9.1 and platelets 170.  Ferritin 824 CRP 4.4 lactic acid 1.9 procalcitonin is less than 0.10 LDH 196.  Sodium 140 potassium 4.3 chloride 104 CO2 29 glucose is 242 BUN 30 creatinine 1.48 albumin 3.1.  Chest x-ray showed mild right midlung opacity and left basilar subsegmental opacity consistent with multifocal pneumonia most likely Covid pneumonia.  Review of Systems: As per HPI otherwise 10 point review of systems negative.    Past Medical History:  Diagnosis Date  . Arthritis   . CAD (coronary artery disease)    s/p DES to LAD 06/2013  . Dementia with behavioral  disturbance ([place])   . Depression   . Dyslipidemia   . Glaucoma   . Hyperglycemia 04/17/2014  . Hyperlipidemia   . Hypertension   . Stroke ([place])   . Type 2 diabetes mellitus ([place])     Past Surgical History:  Procedure Laterality Date  . CARDIAC CATHETERIZATION  06/2013   DES to LAD by Dr Tamala Julian  . Right Cataract       reports that he quit smoking about 50 years ago. His smoking use included cigarettes. He has never used smokeless tobacco. He reports that he does not drink alcohol or use drugs.  Allergies  Allergen Reactions  . Lisinopril Cough    Family History  Problem Relation Age of Onset  . Arthritis Mother   . Breast cancer Mother   . Arthritis Father   . Dementia Father   . Diabetes Other   . Arthritis Other   . Colon cancer Other   . Stroke Other   . Diabetes type II Sister   . Suicidality Brother   . Diabetes type I Brother   . Diabetes type I Brother   . Diabetes type I Brother   . Diabetes type II Sister   . Breast cancer Daughter   . Emphysema Daughter      Prior to Admission medications   Medication Sig Start Date End Date Taking? Authorizing Provider  albuterol (VENTOLIN HFA) 108 (90 Base) MCG/ACT inhaler Inhale 2 puffs into the lungs every 4 (four) hours as  needed for wheezing or shortness of breath. INHALE TWO PUFFS EVERY 4-6 HOURS AS NEEDED FOR DIFFICULTY BREATHING,WHEEZING OR COUGH 06/13/19   Blanchie Dessert, MD  Alcohol Swabs (B-D SINGLE USE SWABS REGULAR) PADS USE AS DIRECTED WITH CHECKING BLOOD SUGAR THREE TIMES DAILY 09/07/17   Gildardo Cranker, DO  amLODipine (NORVASC) 5 MG tablet Take 1 tablet (5 mg total) by mouth daily. 06/13/19   Blanchie Dessert, MD  apixaban (ELIQUIS) 5 MG TABS tablet Take 5 mg by mouth 2 (two) times daily.    [provider]  aspirin EC 81 MG tablet Take 1 tablet (81 mg total) by mouth daily. 06/13/19   Blanchie Dessert, MD  Blood Glucose Calibration (ACCU-CHEK AVIVA) SOLN Use as Directed. Dx: E11.51 04/20/16    Gildardo Cranker, DO  escitalopram (LEXAPRO) 5 MG tablet Take 1 tablet (5 mg total) by mouth daily. 06/13/19   Blanchie Dessert, MD  glucose blood (ACCU-CHEK AVIVA) test strip Use to test blood sugar three times daily. Dx: E11.51 08/31/18   Ngetich, Dinah C, NP  hydrochlorothiazide (HYDRODIURIL) 25 MG tablet Take 1 tablet (25 mg total) by mouth daily. 06/13/19   Blanchie Dessert, MD  insulin glargine (LANTUS) 100 UNIT/ML injection Inject 7 Units into the skin daily.    [provider]  Insulin Pen Needle (B-D UF III MINI PEN NEEDLES) 31G X 5 MM MISC USE AS DIRECTED. 08/31/18   Ngetich, Dinah C, NP  Lancet Devices (ACCU-CHEK SOFTCLIX) lancets Use as instructed with checking blood sugar three times daily. Dx: E11.51 04/01/17   Gildardo Cranker, DO  Loratadine 10 MG CAPS Take 1 capsule (10 mg total) by mouth daily. 06/13/19   Blanchie Dessert, MD  losartan (COZAAR) 50 MG tablet Take 1 tablet (50 mg total) by mouth daily. 06/13/19   Blanchie Dessert, MD  Memantine HCl-Donepezil HCl (NAMZARIC) 28-10 MG CP24 Take 1 capsule by mouth daily. 06/13/19   Blanchie Dessert, MD  mineral oil-hydrophilic petrolatum (AQUAPHOR) ointment Apply topically 2 (two) times daily. To legs 04/11/19   Ngetich, Dinah C, NP  Multiple Vitamins-Minerals (SENTRY SENIOR) TABS Take 1 tablet by mouth daily. 06/13/19   Blanchie Dessert, MD  naproxen sodium (ALEVE) 220 MG tablet Take 440 mg by mouth 2 (two) times daily as needed (headache/pain).    [provider]  simvastatin (ZOCOR) 20 MG tablet Take 1 tablet (20 mg total) by mouth daily. 06/13/19   Blanchie Dessert, MD  tamsulosin (FLOMAX) 0.4 MG CAPS capsule Take 1 capsule (0.4 mg total) by mouth daily after supper. 06/13/19   Blanchie Dessert, MD    Physical Exam: Vitals:   07/05/19 1700 07/05/19 1715 07/05/19 1730 07/05/19 1745  BP: (!) 115/56 (!) 122/59 (!) 106/58 129/61  Pulse: 89 77 76 77  Resp: 13 16 15 15   Temp:      TempSrc:      SpO2: 100% 100% 100%  100%      Constitutional: NAD, calm, comfortable Vitals:   07/05/19 1700 07/05/19 1715 07/05/19 1730 07/05/19 1745  BP: (!) 115/56 (!) 122/59 (!) 106/58 129/61  Pulse: 89 77 76 77  Resp: 13 16 15 15   Temp:      TempSrc:      SpO2: 100% 100% 100% 100%   Eyes: PERRL, lids and conjunctivae normal ENMT: Mucous membranes are moist. Posterior pharynx clear of any exudate or lesions.Normal dentition.  Neck: normal, supple, no masses, no thyromegaly Respiratory: clear to auscultation bilaterally, no wheezing, no crackles. Normal respiratory effort. No accessory muscle use.  Cardiovascular: Regular rate and rhythm, no murmurs / rubs / gallops. No extremity edema. 2+ pedal pulses. No carotid bruits.  Abdomen: no tenderness, no masses palpated. No hepatosplenomegaly. Bowel sounds positive.  Musculoskeletal: no clubbing / cyanosis. No joint deformity upper and lower extremities. Good ROM, no contractures. Normal muscle tone.  Skin: no rashes, lesions, ulcers. No induration Neurologic: CN 2-12 grossly intact. Sensation intact, DTR normal. Strength 5/5 in all 4.  Psychiatric: Normal judgment and insight. Alert and oriented x 3. Normal mood.     Labs on Admission: I have personally reviewed following labs and imaging studies  CBC: Recent Labs  Lab 07/05/19 1536  WBC 3.8*  NEUTROABS 2.2  HGB 9.7*  HCT 31.4*  MCV 96.3  PLT 123XX123   Basic Metabolic Panel: Recent Labs  Lab 07/05/19 1536  NA 142  K 4.3  CL 104  CO2 29  GLUCOSE 242*  BUN 30*  CREATININE 1.48*  CALCIUM 8.9   GFR: CrCl cannot be calculated (Unknown ideal weight.). Liver Function Tests: Recent Labs  Lab 07/05/19 1536  AST 32  ALT 31  ALKPHOS 68  BILITOT 0.3  PROT 6.3*  ALBUMIN 3.1*   No results for input(s): LIPASE, AMYLASE in the last 168 hours. No results for input(s): AMMONIA in the last 168 hours. Coagulation Profile: No results for input(s): INR, PROTIME in the last 168 hours. Cardiac Enzymes: No  results for input(s): CKTOTAL, CKMB, CKMBINDEX, TROPONINI in the last 168 hours. BNP (last 3 results) No results for input(s): PROBNP in the last 8760 hours. HbA1C: No results for input(s): HGBA1C in the last 72 hours. CBG: No results for input(s): GLUCAP in the last 168 hours. Lipid Profile: Recent Labs    07/05/19 1536  TRIG 126   Thyroid Function Tests: No results for input(s): TSH, T4TOTAL, FREET4, T3FREE, THYROIDAB in the last 72 hours. Anemia Panel: Recent Labs    07/05/19 1536  FERRITIN 824*   Urine analysis:    Component Value Date/Time   COLORURINE YELLOW 05/28/2019 0640   APPEARANCEUR CLEAR 05/28/2019 0640   LABSPEC 1.012 05/28/2019 0640   PHURINE 5.0 05/28/2019 0640   GLUCOSEU NEGATIVE 05/28/2019 0640   HGBUR NEGATIVE 05/28/2019 0640   BILIRUBINUR NEGATIVE 05/28/2019 0640   KETONESUR NEGATIVE 05/28/2019 0640   PROTEINUR NEGATIVE 05/28/2019 0640   UROBILINOGEN 0.2 04/17/2014 1756   NITRITE NEGATIVE 05/28/2019 0640   LEUKOCYTESUR NEGATIVE 05/28/2019 0640   Sepsis Labs: @LABRCNTIP (procalcitonin:4,lacticidven:4) )No results found for this or any previous visit (from the past 240 hour(s)).   Radiological Exams on Admission: Dg Chest Port 1 View  Result Date: 07/05/2019 CLINICAL DATA:  Shortness of breath. EXAM: PORTABLE CHEST 1 VIEW COMPARISON:  April 12, 2019. FINDINGS: The heart size and mediastinal contours are within normal limits. No pneumothorax or pleural effusion is noted. Mild right midlung opacity is noted concerning for atelectasis or infiltrate. Probable left basilar subsegmental atelectasis is noted. The visualized skeletal structures are unremarkable. IMPRESSION: Mild right midlung opacity is noted concerning for atelectasis or infiltrate. Probable left basilar subsegmental atelectasis is noted. Followup radiographs are recommended until resolution. Aortic Atherosclerosis (ICD10-I70.0). Electronically Signed   By: Marijo Conception M.D.   On:  07/05/2019 16:54      Assessment/Plan Principal Problem:   Pneumonia due to COVID-19 virus Active Problems:   Type 2 diabetes, controlled, with peripheral circulatory disorder [facility])   Essential hypertension   Coronary artery disease involving native coronary artery of native heart with angina pectoris ([place])  Moderate dementia with behavioral disturbance ([facility])   Frequent falls   Chronic diastolic CHF (congestive heart failure) ([place])     #1 COVID-19 pneumonia: Patient will be admitted to medical telemetry.  Initiate IV steroids, remdesivir, anticoagulation, oxygen as well as albuterol inhaler.  #2 coronary artery disease: Stable at baseline.  Continue treatment.  #3 diabetes: Initiate sliding scale insulin and continue home regimen.  Patient likely to have worsening blood sugar control especially with steroids.  #4 Alzheimer's dementia: Stable no agitation.  #5 diastolic CHF: Compensated.  #6 recent CVA: Continue physical therapy and Occupational Therapy   DVT prophylaxis: Xarelto Code Status: Full code Family Communication: Daughter over the phone Disposition Plan: To be determined Consults called: None Admission status: Inpatient  Severity of Illness: The appropriate patient status for this patient is INPATIENT. Inpatient status is judged to be reasonable and necessary in order to provide the required intensity of service to ensure the patient's safety. The patient's presenting symptoms, physical exam findings, and initial radiographic and laboratory data in the context of their chronic comorbidities is felt to place them at high risk for further clinical deterioration. Furthermore, it is not anticipated that the patient will be medically stable for discharge from the hospital within 2 midnights of admission. The following factors support the patient status of inpatient.   " The patient's presenting symptoms include shortness of breath and cough. " The worrisome physical  exam findings include coarse breath sounds with confusion. " The initial radiographic and laboratory data are worrisome because of evidence of pneumonia and COVID-19. " The chronic co-morbidities include dementia.   * I certify that at the point of admission it is my clinical judgment that the patient will require inpatient hospital care spanning beyond 2 midnights from the point of admission due to high intensity of service, high risk for further deterioration and high frequency of surveillance required.Barbette Merino MD [facility] Pager [phone]  If 7PM-7AM, please contact night-coverage [email or web address] Password [facility]  07/05/2019, 6:24 PM

## 2019-07-05 NOTE — Patient Outreach (Addendum)
[place] [facility]) Care Management  07/05/2019  [name] Nov 23, 1921 [id number]   Referral Date: 07/05/2019 Referral Source: [employer] Report Referral Reason: Discharge from [facility] Attempt: Spoke with daughter [name] who is patient health care power of attorney as listed in ACP.  She is able to verify HIPAA.     Social:Patient lives in the home with daughter. Per daughter [name], patient is dependent with all aspects of care. She states that patient needs a hospital bed for care and that she is not able to move him herself.  She states that patient is really in the same shape he was prior to the nursing home stay and that she is disappointed that [facility] would not approve him to stay longer. She states she is going to look into [facility] but knows that is a longer process.  She states that home health PT came by for the evaluation yesterday but no one has gotten back with her yet with visits but she was told it would probably be 2 days a week according to what the insurance normally covers. She feels patient needs more than that along with aide services.  Encouraged daughter to follow up with home health when they return about patient services. She is reluctant to place patient again due to COVID-19.    Discussed [facility] services and nurse follow up calls.  Daughter states she really is not sure how CM could help as she needs physical help with her dad.  She is however open to CM calling next week to check in. Advised her that I would send letter and brochure to her as well.  She is agreeable.   Discussed with daughter [facility] program evaluation as this may help also in patient getting the help she needs. Daughter is agreeable for evaluation.    Conditions: medical history significant forcoronary artery disease, history of CVA, insulin-dependent type II diabetes mellitus, hypertension, chronic [name] disease stage IIIand moderate dementia with behavioral  disturbance.  Daughter is really concerned about patient current deconditioning and want more help for her dad but she has trouble managing patient by herself with limited other family involvement.     Medications: Daughter manages patient medications and denies any problems.    Appointments: Patient has video visit scheduled for today with doctor.     Advanced Directives: Daughter [name] is HCPOA.     Plan: RN CM will contact daughter [name] next week   Telephone call to [facility] program to place referral on patient fir evaluation. Spoke with Manus Gunning and she will keep CM informed of status.    Telephone call to [facility] to inquire about home health orders.  Message left for [facility].    Update 12:45 pm: Incoming call from [place] at [place] at [facility].  She states that from the PT evaluation it has been determined that daughter cannot take care of patient. She also added that patient has run a low grade fever and developed a cough since being home and that [facility] has had some COVID-19 in the facility.  She states that daughter has no way to get patient tested so her staff will treat patient as COVID+ and that the therapist would be the only one involved at this time but would try to show daughter how to care for patient.  Advised her that I did discussed [facility] with daughter and she is agreeable for at least the referral.  Advised her  that I would keep her posted as patient [facility] status. She is agreeable.    Jone Baseman, RN, MSN [facility] Care Management Care Management Coordinator Direct Line [phone] Toll Free: [phone]  Fax: [phone]

## 2019-07-06 ENCOUNTER — Telehealth: Payer: Self-pay

## 2019-07-06 DIAGNOSIS — J1282 Pneumonia due to coronavirus disease 2019: Secondary | ICD-10-CM | POA: Diagnosis present

## 2019-07-06 DIAGNOSIS — J1289 Other viral pneumonia: Secondary | ICD-10-CM | POA: Diagnosis present

## 2019-07-06 LAB — CBC WITH DIFFERENTIAL/PLATELET
Abs Immature Granulocytes: 0.01 10*3/uL (ref 0.00–0.07)
Basophils Absolute: 0 10*3/uL (ref 0.0–0.1)
Basophils Relative: 0 %
Eosinophils Absolute: 0 10*3/uL (ref 0.0–0.5)
Eosinophils Relative: 1 %
HCT: 34.1 % — ABNORMAL LOW (ref 39.0–52.0)
Hemoglobin: 10.5 g/dL — ABNORMAL LOW (ref 13.0–17.0)
Immature Granulocytes: 0 %
Lymphocytes Relative: 22 %
Lymphs Abs: 0.9 10*3/uL (ref 0.7–4.0)
MCH: 29.7 pg (ref 26.0–34.0)
MCHC: 30.8 g/dL (ref 30.0–36.0)
MCV: 96.6 fL (ref 80.0–100.0)
Monocytes Absolute: 0.2 10*3/uL (ref 0.1–1.0)
Monocytes Relative: 4 %
Neutro Abs: 2.9 10*3/uL (ref 1.7–7.7)
Neutrophils Relative %: 73 %
Platelets: 162 10*3/uL (ref 150–400)
RBC: 3.53 MIL/uL — ABNORMAL LOW (ref 4.22–5.81)
RDW: 13.1 % (ref 11.5–15.5)
WBC: 4 10*3/uL (ref 4.0–10.5)
nRBC: 0 % (ref 0.0–0.2)

## 2019-07-06 LAB — CBG MONITORING, ED
Glucose-Capillary: 157 mg/dL — ABNORMAL HIGH (ref 70–99)
Glucose-Capillary: 164 mg/dL — ABNORMAL HIGH (ref 70–99)
Glucose-Capillary: 182 mg/dL — ABNORMAL HIGH (ref 70–99)

## 2019-07-06 LAB — COMPREHENSIVE METABOLIC PANEL
ALT: 30 U/L (ref 0–44)
AST: 35 U/L (ref 15–41)
Albumin: 3.2 g/dL — ABNORMAL LOW (ref 3.5–5.0)
Alkaline Phosphatase: 69 U/L (ref 38–126)
Anion gap: 8 (ref 5–15)
BUN: 29 mg/dL — ABNORMAL HIGH (ref 8–23)
CO2: 30 mmol/L (ref 22–32)
Calcium: 8.9 mg/dL (ref 8.9–10.3)
Chloride: 105 mmol/L (ref 98–111)
Creatinine, Ser: 1.29 mg/dL — ABNORMAL HIGH (ref 0.61–1.24)
GFR calc Af Amer: 54 mL/min — ABNORMAL LOW (ref >60)
GFR calc non Af Amer: 47 mL/min — ABNORMAL LOW (ref >60)
Glucose, Bld: 193 mg/dL — ABNORMAL HIGH (ref 70–99)
Potassium: 4.9 mmol/L (ref 3.5–5.1)
Sodium: 143 mmol/L (ref 135–145)
Total Bilirubin: 0.7 mg/dL (ref 0.3–1.2)
Total Protein: 6.5 g/dL (ref 6.5–8.1)

## 2019-07-06 LAB — GLUCOSE, CAPILLARY
Glucose-Capillary: 134 mg/dL — ABNORMAL HIGH (ref 70–99)
Glucose-Capillary: 180 mg/dL — ABNORMAL HIGH (ref 70–99)

## 2019-07-06 LAB — LACTIC ACID, PLASMA: Lactic Acid, Venous: 1.4 mmol/L (ref 0.5–1.9)

## 2019-07-06 LAB — ABO/RH: ABO/RH(D): AB POS

## 2019-07-06 LAB — D-DIMER, QUANTITATIVE: D-Dimer, Quant: 3.59 ug/mL-FEU — ABNORMAL HIGH (ref 0.00–0.50)

## 2019-07-06 LAB — FERRITIN: Ferritin: 1048 ng/mL — ABNORMAL HIGH (ref 24–336)

## 2019-07-06 LAB — C-REACTIVE PROTEIN: CRP: 6.1 mg/dL — ABNORMAL HIGH (ref <1.0)

## 2019-07-06 LAB — MRSA PCR SCREENING: MRSA by PCR: NEGATIVE

## 2019-07-06 MED ORDER — SODIUM CHLORIDE 0.9 % IV SOLN
1.0000 g | INTRAVENOUS | Status: DC
  Administered 2019-07-06: 1 g via INTRAVENOUS
  Filled 2019-07-06: qty 10

## 2019-07-06 MED ORDER — TAMSULOSIN HCL 0.4 MG PO CAPS
0.4000 mg | ORAL_CAPSULE | Freq: Every day | ORAL | Status: DC
  Administered 2019-07-06 – 2019-07-13 (×8): 0.4 mg via ORAL
  Filled 2019-07-06 (×9): qty 1

## 2019-07-06 MED ORDER — DONEPEZIL HCL 10 MG PO TABS
10.0000 mg | ORAL_TABLET | Freq: Every day | ORAL | Status: DC
  Administered 2019-07-06 – 2019-07-12 (×7): 10 mg via ORAL
  Filled 2019-07-06 (×7): qty 1

## 2019-07-06 MED ORDER — SODIUM CHLORIDE 0.9 % IV SOLN
INTRAVENOUS | Status: DC
  Administered 2019-07-06: 17:00:00 via INTRAVENOUS

## 2019-07-06 MED ORDER — GABAPENTIN 100 MG PO CAPS
100.0000 mg | ORAL_CAPSULE | Freq: Every day | ORAL | Status: DC
  Administered 2019-07-06 – 2019-07-12 (×7): 100 mg via ORAL
  Filled 2019-07-06 (×6): qty 1

## 2019-07-06 MED ORDER — MEMANTINE HCL-DONEPEZIL HCL ER 28-10 MG PO CP24: 1.0000 | ORAL_CAPSULE | Freq: Every day | ORAL | Status: DC

## 2019-07-06 MED ORDER — ESCITALOPRAM OXALATE 10 MG PO TABS
5.0000 mg | ORAL_TABLET | Freq: Every day | ORAL | Status: DC
  Administered 2019-07-06 – 2019-07-13 (×8): 5 mg via ORAL
  Filled 2019-07-06 (×8): qty 1

## 2019-07-06 MED ORDER — LORATADINE 10 MG PO CAPS: 1.0000 | ORAL_CAPSULE | Freq: Every day | ORAL | Status: DC

## 2019-07-06 MED ORDER — ADULT MULTIVITAMIN W/MINERALS CH
1.0000 | ORAL_TABLET | Freq: Every day | ORAL | Status: DC
  Administered 2019-07-07 – 2019-07-13 (×7): 1 via ORAL
  Filled 2019-07-06 (×7): qty 1

## 2019-07-06 MED ORDER — MEMANTINE HCL ER 14 MG PO CP24
28.0000 mg | ORAL_CAPSULE | Freq: Every day | ORAL | Status: DC
  Administered 2019-07-06 – 2019-07-12 (×7): 28 mg via ORAL
  Filled 2019-07-06: qty 1
  Filled 2019-07-06 (×7): qty 2

## 2019-07-06 MED ORDER — AMLODIPINE BESYLATE 5 MG PO TABS
5.0000 mg | ORAL_TABLET | Freq: Every day | ORAL | Status: DC
  Administered 2019-07-07 – 2019-07-13 (×7): 5 mg via ORAL
  Filled 2019-07-06 (×7): qty 1

## 2019-07-06 MED ORDER — LORATADINE 10 MG PO TABS
10.0000 mg | ORAL_TABLET | Freq: Every day | ORAL | Status: DC
  Administered 2019-07-07 – 2019-07-13 (×7): 10 mg via ORAL
  Filled 2019-07-06 (×7): qty 1

## 2019-07-06 MED ORDER — SENTRY SENIOR PO TABS: 1.0000 | ORAL_TABLET | Freq: Every day | ORAL | Status: DC

## 2019-07-06 MED ORDER — INSULIN GLARGINE 100 UNIT/ML ~~LOC~~ SOLN
10.0000 [IU] | Freq: Every day | SUBCUTANEOUS | Status: DC
  Administered 2019-07-06: 10 [IU] via SUBCUTANEOUS
  Filled 2019-07-06: qty 0.1

## 2019-07-06 MED ORDER — ORAL CARE MOUTH RINSE
15.0000 mL | Freq: Two times a day (BID) | OROMUCOSAL | Status: DC
  Administered 2019-07-07 – 2019-07-13 (×11): 15 mL via OROMUCOSAL

## 2019-07-06 MED ORDER — ENSURE ENLIVE PO LIQD
237.0000 mL | Freq: Two times a day (BID) | ORAL | Status: DC
  Administered 2019-07-08 – 2019-07-13 (×12): 237 mL via ORAL

## 2019-07-06 MED ORDER — ASPIRIN EC 81 MG PO TBEC
81.0000 mg | DELAYED_RELEASE_TABLET | Freq: Every day | ORAL | Status: DC
  Administered 2019-07-06 – 2019-07-13 (×8): 81 mg via ORAL
  Filled 2019-07-06 (×8): qty 1

## 2019-07-06 MED ORDER — INSULIN GLARGINE (1 UNIT DIAL) 300 UNIT/ML ~~LOC~~ SOPN: 10.0000 [IU] | PEN_INJECTOR | Freq: Every day | SUBCUTANEOUS | Status: DC

## 2019-07-06 MED ORDER — ALBUTEROL SULFATE HFA 108 (90 BASE) MCG/ACT IN AERS
2.0000 | INHALATION_SPRAY | RESPIRATORY_TRACT | Status: DC | PRN
  Filled 2019-07-06: qty 6.7

## 2019-07-06 NOTE — ED Notes (Signed)
Attempted to give drink of water prior to med administration.  Pt very adamant about no water or anything by mouth.  Pushes water away and spits tablet on the floor.  No other distress noted.  Coarse deep cough without resp distress.  No outward pain responses.

## 2019-07-06 NOTE — Progress Notes (Signed)
PROGRESS NOTE    Rien Patrice  [id number] DOB: [birth year] DOA: 07/05/2019 PCP: Gayland Curry, DO    Brief Narrative:  Patient is 85 year old African-American gentleman with extensive medical problems, debility, type 2 diabetes on insulin, hypertension, coronary artery disease, hyperlipidemia, history of stroke, dementia who presented to the emergency room with several days of cough and shortness of breath.  According to the patient's daughter, he has been at [facility] getting rehab after previous hospitalization.  She took him home on 12/1, he is now complete dependence on activities and mostly bedbound.  She thinks Haldol made him like this.  [place] called her yesterday that his COVID-19 test is positive, she was noticing he is making more gurgly noise on breathing so she brought him to the ER.  Patient is demented, nods his head, unable to provide any history. I called patient daughter to supplement history and medication of plan. Daughter noticed that he is weaker and weaker every day.  EMS found him with 80% on room air.  Brought to ER for evaluation. In the emergency room, he is afebrile.  Blood pressure stable.  Patient was on 5 L oxygen was saturating 100%.  Chest x-ray shows mild right midlung opacity and left basilar subsegmental opacity consistent with multifocal pneumonia.  COVID-19 positive.  Admitted for COVID-19 pneumonia with hypoxia.   Assessment & Plan:   Principal Problem:   Pneumonia due to COVID-19 virus Active Problems:   Type 2 diabetes, controlled, with peripheral circulatory disorder (HCC)   Essential hypertension   Coronary artery disease involving native coronary artery of native heart with angina pectoris (HCC)   Moderate dementia with behavioral disturbance (HCC)   Frequent falls   Chronic diastolic CHF (congestive heart failure) (HCC)   Pneumonia due to 2019 novel coronavirus  Pneumonia due to COVID-19 virus, acute respiratory disease due to  COVID-19, hypoxia: Continue to monitor due to significant symptoms . chest physiotherapy, incentive spirometry, deep breathing exercises, sputum induction, mucolytic's and bronchodilators. Supplemental oxygen to keep saturations more than 90%. Covid directed therapy with , steroids dexamethasone remdesivir day 1/5 antibiotics indicated due to recent nursing home discharge, upper airway secretions and possibility of superadded bacterial infection. Due to severity of symptoms, patient will need daily inflammatory markers, chest x-rays, liver function test to monitor and direct COVID-19 therapies.  Type 2 diabetes: On very high-dose insulin at home.  Currently with unreliable intake.  Resume low-dose long-acting insulin.  Continue sliding scale insulin.  May need up titration depends upon outcome.  Pulmonary embolism: By history.  Already therapeutic on Eliquis.  Continue.  Coronary artery disease: No evidence of acute coronary syndrome.  On aspirin, beta-blockers, Eliquis.  Continue.  Statin on hold.  Hypertension: Blood pressure stable.  Resume all his home medications except hydrochlorothiazide with AKI.  Dementia/debility: Patient with advanced dementia and debilitated status.  Currently total care at home.  Patient's daughter wishes full scope of treatment including CPR and intubation if needed. We will try to work with PT OT. Ultimately may go home with daughter's care.  DVT prophylaxis: Eliquis Code Status: Full code.  Daughter confirms. Family Communication: Patient's daughter, Ms. Mary Disposition Plan: Admit to [place] to complete treatment of COVID-19 pneumonia   Consultants:   None  Procedures:   None  Antimicrobials:  Anti-infectives (From admission, onward)   Start     Dose/Rate Route Frequency Ordered Stop   07/06/19 1130  cefTRIAXone (ROCEPHIN) 1 g in sodium chloride 0.9 % 100  mL IVPB     1 g 200 mL/hr over 30 Minutes Intravenous Every 24 hours  07/06/19 1123     07/06/19 1000  remdesivir 100 mg in sodium chloride 0.9 % 100 mL IVPB     100 mg 200 mL/hr over 30 Minutes Intravenous Every 24 hours 07/05/19 1838 07/10/19 0959   07/05/19 2015  azithromycin (ZITHROMAX) 500 mg in sodium chloride 0.9 % 250 mL IVPB     500 mg 250 mL/hr over 60 Minutes Intravenous Every 24 hours 07/05/19 2010     07/05/19 1930  remdesivir 200 mg in sodium chloride 0.9% 250 mL IVPB     200 mg 580 mL/hr over 30 Minutes Intravenous Once 07/05/19 1838 07/05/19 2158         Subjective: Patient seen and examined.  No overnight events.  Nurses reported that he would not like to eat much.  Daughter reports that he only eats soft diet and needs to be fed in upright position. Afebrile overnight.  He had 5 L oxygen going on his nasal cannula, however he was 100% on monitor.  He did not look in any distress. When asked questions, he was just nodding and did not respond well.  Objective: Vitals:   07/06/19 0945 07/06/19 1015 07/06/19 1030 07/06/19 1100  BP: 109/69 118/70 119/66 123/69  Pulse: 78     Resp: 18 12 18 14   Temp:      TempSrc:      SpO2: 100%       Intake/Output Summary (Last 24 hours) at 07/06/2019 1128 Last data filed at 07/06/2019 0900 Gross per 24 hour  Intake 300 ml  Output -  Net 300 ml   There were no vitals filed for this visit.  Examination:  General exam: Appears calm and comfortable, chronically sick looking, on minimal oxygen. Respiratory system: Audible upper airway sounds.  Otherwise clear.  Respiratory effort normal. Cardiovascular system: S1 & S2 heard, RRR. No JVD, murmurs, rubs, gallops or clicks. No pedal edema.:  Left Gastrointestinal system: Abdomen is nondistended, soft and nontender. No organomegaly or masses felt. Normal bowel sounds heard. Central nervous system: Alert but not oriented.  Did not follow commands but gave both hands on stimulation psychiatry: Judgement and insight appear provides.  Mood and affect is  flat.      Data Reviewed: I have personally reviewed following labs and imaging studies  CBC: Recent Labs  Lab 07/05/19 1536 07/06/19 0204  WBC 3.8* 4.0  NEUTROABS 2.2 2.9  HGB 9.7* 10.5*  HCT 31.4* 34.1*  MCV 96.3 96.6  PLT 170 0000000   Basic Metabolic Panel: Recent Labs  Lab 07/05/19 1536 07/06/19 0204  NA 142 143  K 4.3 4.9  CL 104 105  CO2 29 30  GLUCOSE 242* 193*  BUN 30* 29*  CREATININE 1.48* 1.29*  CALCIUM 8.9 8.9   GFR: CrCl cannot be calculated (Unknown ideal weight.). Liver Function Tests: Recent Labs  Lab 07/05/19 1536 07/06/19 0204  AST 32 35  ALT 31 30  ALKPHOS 68 69  BILITOT 0.3 0.7  PROT 6.3* 6.5  ALBUMIN 3.1* 3.2*   No results for input(s): LIPASE, AMYLASE in the last 168 hours. No results for input(s): AMMONIA in the last 168 hours. Coagulation Profile: No results for input(s): INR, PROTIME in the last 168 hours. Cardiac Enzymes: No results for input(s): CKTOTAL, CKMB, CKMBINDEX, TROPONINI in the last 168 hours. BNP (last 3 results) No results for input(s): PROBNP in the last 8760 hours.  HbA1C: No results for input(s): HGBA1C in the last 72 hours. CBG: Recent Labs  Lab 07/06/19 0050 07/06/19 0808  GLUCAP 157* 182*   Lipid Profile: Recent Labs    07/05/19 1536  TRIG 126   Thyroid Function Tests: No results for input(s): TSH, T4TOTAL, FREET4, T3FREE, THYROIDAB in the last 72 hours. Anemia Panel: Recent Labs    07/05/19 1536 07/06/19 0204  FERRITIN 824* 1,048*   Sepsis Labs: Recent Labs  Lab 07/05/19 1536 07/06/19 0147  PROCALCITON <0.10  --   LATICACIDVEN 1.9 1.4    Recent Results (from the past 240 hour(s))  Blood Culture (routine x 2)     Status: None (Preliminary result)   Collection Time: 07/05/19  4:03 PM   Specimen: BLOOD RIGHT HAND  Result Value Ref Range Status   Specimen Description BLOOD RIGHT HAND  Final   Special Requests   Final    BOTTLES DRAWN AEROBIC AND ANAEROBIC Blood Culture results may not be  optimal due to an inadequate volume of blood received in culture bottles   Culture   Final    NO GROWTH < 24 HOURS Performed at [facility], [place] [address], [place], [place] [address]    Report Status PENDING  Incomplete  Blood Culture (routine x 2)     Status: None (Preliminary result)   Collection Time: 07/05/19  4:05 PM   Specimen: BLOOD  Result Value Ref Range Status   Specimen Description BLOOD LEFT ANTECUBITAL  Final   Special Requests   Final    BOTTLES DRAWN AEROBIC AND ANAEROBIC Blood Culture results may not be optimal due to an inadequate volume of blood received in culture bottles   Culture   Final    NO GROWTH < 24 HOURS Performed at [facility], [place] [address], [place], [place] [address]    Report Status PENDING  Incomplete         Radiology Studies: Dg Chest Port 1 View  Result Date: 07/05/2019 CLINICAL DATA:  Shortness of breath. EXAM: PORTABLE CHEST 1 VIEW COMPARISON:  April 12, 2019. FINDINGS: The heart size and mediastinal contours are within normal limits. No pneumothorax or pleural effusion is noted. Mild right midlung opacity is noted concerning for atelectasis or infiltrate. Probable left basilar subsegmental atelectasis is noted. The visualized skeletal structures are unremarkable. IMPRESSION: Mild right midlung opacity is noted concerning for atelectasis or infiltrate. Probable left basilar subsegmental atelectasis is noted. Followup radiographs are recommended until resolution. Aortic Atherosclerosis (ICD10-I70.0). Electronically Signed   By: Marijo Conception M.D.   On: 07/05/2019 16:54        Scheduled Meds: . albuterol  2 puff Inhalation Q6H  . apixaban  5 mg Oral BID  . dexamethasone (DECADRON) injection  6 mg Intravenous Q24H  . insulin aspart  0-5 Units Subcutaneous QHS  . insulin aspart  0-9 Units Subcutaneous TID WC   Continuous Infusions: . azithromycin Stopped (07/06/19 0040)  . cefTRIAXone (ROCEPHIN)  IV    . remdesivir  100 mg in NS 100 mL       LOS: 1 day    Time spent: 35 minutes    Barb Merino, MD [facility] Pager [phone]

## 2019-07-06 NOTE — Progress Notes (Signed)
Multiple communication with patient's daughter, she was inquiring whether he can go home as he is not eating.  She also inquired whether she can come to the hospital to feed him.  I explained to her that he is requiring some oxygen and there is evidence of pneumonia on his chest x-ray so he may benefit with hospitalization and treatment. Patient's daughter is understandably emotional with dad's disease.  Plan: Transfer to [facility]. Daughter confirms full code. He has declined all meals since morning, maintenance IV fluids low-dose. We will assess his progress every day and update her.

## 2019-07-06 NOTE — Telephone Encounter (Signed)
I know there were several messages around this hospital bed and Dinah had revised the order in epic a few days ago.

## 2019-07-06 NOTE — Telephone Encounter (Signed)
Patients daughter called to check the status of hospital bed order.   I spoke with Lattie Haw (referral coordinator) and she redirected me to a message noted on home health referral dated 06/07/2019:  Per [facility], Cecille Rubin spoke with [facility] (Asia Henrene Pastor) social worker says pt will be dc from [place] 07/03/19. [place] will line up the bed(DME & HH) at dc 06/27/19/LM  I shared this information with [name] and [name] replied "Someone is lying." [name] states [place] told her that we were to follow-up on this and she was going to call them back now. In the meantime [name] is asking that Janett Billow give an order for hospital bed.  I asked [name] where would she like order sent to and she was unable to provide an answer, replied "I don't know."

## 2019-07-06 NOTE — ED Notes (Signed)
Daughter on the phone for an update, phone taken to patient.

## 2019-07-06 NOTE — ED Notes (Signed)
PAGED DR Kennon Holter TO RN BOBBY--Kayal Mula

## 2019-07-06 NOTE — ED Notes (Signed)
Patient CBG was 182, The Nurse was informed. Pt. Refused to eat Breakfast.

## 2019-07-06 NOTE — ED Notes (Signed)
Dr. Kennon Holter notified on patient's elevated D-dimer result.

## 2019-07-06 NOTE — Progress Notes (Signed)
Pt is pocketing pills.  Cough after food/drink and ministered.  SLP consult needed.

## 2019-07-06 NOTE — Progress Notes (Signed)
Called patient's daughter and updated. All questions answered. Daughter requested to facetime with patient - facetime completed. Daughter very grateful to see patient. Informed of updating policy where nurses try to call once a shift so twice a day and doctors try to call daily for updates. Daughter very appreciative, states she is okay without second update tonight unless something changes as she was fully updated.

## 2019-07-06 NOTE — Telephone Encounter (Signed)
I agree with Janett Billow.I revised the previous note and printed Hospital bed prescription to be faxed to [facility].

## 2019-07-06 NOTE — ED Notes (Signed)
Phil ([facility]/Transfer to [facility] @ 1110-per RN called by Levada Dy

## 2019-07-06 NOTE — ED Notes (Signed)
Pt repositioned and assessed.  Able to tell me his name, asked if he was hungry and he adamantly said no.  Repeatedly states, no water, no water.  No distress noted or changes in respiratory status.  Waiting for transport to [facility]

## 2019-07-06 NOTE — Progress Notes (Signed)
Seen on arrival to [facility].  Doesn't speak, but nods his head when I speak to him.  Dinner in front of him, but he hasn't started eating.  See previous note for details regarding plan.

## 2019-07-06 NOTE — ED Notes (Signed)
Patient daughter [name] asking for a call back, would like an update on patient  [phone]

## 2019-07-06 NOTE — ED Notes (Signed)
Daughter called very concerned about patient status and refusal for meds and fluids/food.  MD notified to call daughter.  She is also questioning whether he is stable to go home.

## 2019-07-06 NOTE — ED Notes (Signed)
Lunch Tray Ordered @ 1020.  

## 2019-07-06 NOTE — ED Notes (Signed)
Report called to [facility], transport called as well.

## 2019-07-06 NOTE — ED Notes (Signed)
Ordered breakfast--[name] 

## 2019-07-06 NOTE — Telephone Encounter (Signed)
Spoke with patients daughter and she plans to take to the hospital doctor about this request. [name] states " All I know is he needs to have a bed upon returning home."  [name] did not consult with Isaias Cowman as she previously stated for she again indicated they lie too much

## 2019-07-06 NOTE — ED Notes (Signed)
Daughter contact #- [phone]

## 2019-07-06 NOTE — Telephone Encounter (Signed)
It looks like he has now been admitted to the hospital with pneumonia related to [facility], they can help assist with discharge planning and to get a hospital bed in the home upon discharge.

## 2019-07-07 DIAGNOSIS — I10 Essential (primary) hypertension: Secondary | ICD-10-CM

## 2019-07-07 DIAGNOSIS — J069 Acute upper respiratory infection, unspecified: Secondary | ICD-10-CM

## 2019-07-07 DIAGNOSIS — E1151 Type 2 diabetes mellitus with diabetic peripheral angiopathy without gangrene: Secondary | ICD-10-CM

## 2019-07-07 DIAGNOSIS — I5032 Chronic diastolic (congestive) heart failure: Secondary | ICD-10-CM

## 2019-07-07 DIAGNOSIS — F0391 Unspecified dementia with behavioral disturbance: Secondary | ICD-10-CM

## 2019-07-07 LAB — COMPREHENSIVE METABOLIC PANEL
ALT: 38 U/L (ref 0–44)
AST: 47 U/L — ABNORMAL HIGH (ref 15–41)
Albumin: 2.9 g/dL — ABNORMAL LOW (ref 3.5–5.0)
Alkaline Phosphatase: 59 U/L (ref 38–126)
Anion gap: 10 (ref 5–15)
BUN: 36 mg/dL — ABNORMAL HIGH (ref 8–23)
CO2: 29 mmol/L (ref 22–32)
Calcium: 8.9 mg/dL (ref 8.9–10.3)
Chloride: 105 mmol/L (ref 98–111)
Creatinine, Ser: 1.12 mg/dL (ref 0.61–1.24)
GFR calc Af Amer: >60 mL/min (ref >60)
GFR calc non Af Amer: 56 mL/min — ABNORMAL LOW (ref >60)
Glucose, Bld: 226 mg/dL — ABNORMAL HIGH (ref 70–99)
Potassium: 4.6 mmol/L (ref 3.5–5.1)
Sodium: 144 mmol/L (ref 135–145)
Total Bilirubin: 0.2 mg/dL — ABNORMAL LOW (ref 0.3–1.2)
Total Protein: 6.2 g/dL — ABNORMAL LOW (ref 6.5–8.1)

## 2019-07-07 LAB — CBC WITH DIFFERENTIAL/PLATELET
Abs Immature Granulocytes: 0.01 10*3/uL (ref 0.00–0.07)
Basophils Absolute: 0 10*3/uL (ref 0.0–0.1)
Basophils Relative: 0 %
Eosinophils Absolute: 0 10*3/uL (ref 0.0–0.5)
Eosinophils Relative: 0 %
HCT: 29.1 % — ABNORMAL LOW (ref 39.0–52.0)
Hemoglobin: 9 g/dL — ABNORMAL LOW (ref 13.0–17.0)
Immature Granulocytes: 0 %
Lymphocytes Relative: 19 %
Lymphs Abs: 0.5 10*3/uL — ABNORMAL LOW (ref 0.7–4.0)
MCH: 29.5 pg (ref 26.0–34.0)
MCHC: 30.9 g/dL (ref 30.0–36.0)
MCV: 95.4 fL (ref 80.0–100.0)
Monocytes Absolute: 0.2 10*3/uL (ref 0.1–1.0)
Monocytes Relative: 7 %
Neutro Abs: 1.9 10*3/uL (ref 1.7–7.7)
Neutrophils Relative %: 74 %
Platelets: 179 10*3/uL (ref 150–400)
RBC: 3.05 MIL/uL — ABNORMAL LOW (ref 4.22–5.81)
RDW: 12.8 % (ref 11.5–15.5)
WBC: 2.6 10*3/uL — ABNORMAL LOW (ref 4.0–10.5)
nRBC: 0 % (ref 0.0–0.2)

## 2019-07-07 LAB — MAGNESIUM: Magnesium: 2.1 mg/dL (ref 1.7–2.4)

## 2019-07-07 LAB — GLUCOSE, CAPILLARY
Glucose-Capillary: 191 mg/dL — ABNORMAL HIGH (ref 70–99)
Glucose-Capillary: 233 mg/dL — ABNORMAL HIGH (ref 70–99)
Glucose-Capillary: 165 mg/dL — ABNORMAL HIGH (ref 70–99)

## 2019-07-07 LAB — FERRITIN: Ferritin: 1048 ng/mL — ABNORMAL HIGH (ref 24–336)

## 2019-07-07 LAB — D-DIMER, QUANTITATIVE: D-Dimer, Quant: 2.39 ug/mL-FEU — ABNORMAL HIGH (ref 0.00–0.50)

## 2019-07-07 LAB — C-REACTIVE PROTEIN: CRP: 3.8 mg/dL — ABNORMAL HIGH (ref <1.0)

## 2019-07-07 MED ORDER — INSULIN GLARGINE 100 UNIT/ML ~~LOC~~ SOLN
10.0000 [IU] | Freq: Two times a day (BID) | SUBCUTANEOUS | Status: DC
  Administered 2019-07-07 – 2019-07-13 (×13): 10 [IU] via SUBCUTANEOUS
  Filled 2019-07-07 (×14): qty 0.1

## 2019-07-07 MED ORDER — RESOURCE THICKENUP CLEAR PO POWD
ORAL | Status: DC | PRN
  Filled 2019-07-07: qty 125

## 2019-07-07 NOTE — Progress Notes (Signed)
Family Update  Primary Nurse spoke with daughter [name] to review POC, nurse and daughter plan to have a facetime call with patient once he wakes up from his afternoon nap.

## 2019-07-07 NOTE — Progress Notes (Signed)
[facility] PROGRESS NOTE    Progress Note  [name]  [id number] DOB: February 15, 1922 DOA: 07/05/2019 PCP: Gayland Curry, DO     Brief Narrative:   [name] is an 85 y.o. male past medical history of diabetes mellitus type 2, essential hypertension coronary artery disease history of stroke dementia presents to the ED with several days of cough and shortness of breath.  80% on room air was brought into the ED placed on 5 L of oxygen his saturations improved, chest x-ray showed bilateral opacities, SARS-CoV-2 PCR was positive  Assessment/Plan:   Acute respiratory failure with hypoxia due to Pneumonia due to COVID-19 virus Was initially requiring 2 L of oxygen to keep saturations greater than 94%.  He has been weaned to room air this morning. He was started on IV remdesivir and IV steroids, vitamin C and zinc. Started empirically on IV Rocephin azithro procalcitonin less than 0.1, will discontinue IV empiric antibiotics. Inflammatory markers are improving. Try to keep the patient prone for at least 16 hours a day, when not prone out of bed to chair.  Acute kidney injury: Likely prerenal azotemia, in the setting of hydrochlorothiazide and decreased oral intake he was started on IV fluids hydrochlorothiazide was held.  His creatinine has improved.  We will KVO IV fluids.  Type 2 diabetes, controlled, with peripheral circulatory disorder (HCC) Was not taking in orals reliably his home insulin regimen was held now is trending up.  We will start him on long-acting insulin continue sliding scale.  History of pulmonary embolism: Continue Eliquis.  Essential hypertension: Resume all antihypertensive medication except for hydrochlorothiazide due to his acute renal failure.  Dementia: Patient with advanced dementia significantly deconditioned. PT OT evaluation pending.    DVT prophylaxis: lovenox Family Communication:none Disposition Plan/Barrier to D/C: unable to  determine Code Status:     Code Status Orders  (From admission, onward)         Start     Ordered   07/05/19 2010  Full code  Continuous     07/05/19 2010        Code Status History    Date Active Date Inactive Code Status Order ID Comments User Context   07/05/2019 1824 07/05/2019 2010 Full Code [id number]  Margarita Mail, PA-C ED   07/05/2019 1821 07/05/2019 1824 Full Code [id number]  Margarita Mail, PA-C ED   06/01/2019 2257 06/05/2019 2147 Full Code [id number]  Vianne Bulls, MD ED   04/17/2014 2008 04/19/2014 1418 Full Code [id number]  Shanda Howells, MD ED   Advance Care Planning Activity    Advance Directive Documentation     Most Recent Value  Type of Advance Directive  Healthcare Power of Attorney  Pre-existing out of facility DNR order (yellow form or pink MOST form)  -  "MOST" Form in Place?  -        IV Access:    Peripheral IV   Procedures and diagnostic studies:   Dg Chest Port 1 View  Result Date: 07/05/2019 CLINICAL DATA:  Shortness of breath. EXAM: PORTABLE CHEST 1 VIEW COMPARISON:  April 12, 2019. FINDINGS: The heart size and mediastinal contours are within normal limits. No pneumothorax or pleural effusion is noted. Mild right midlung opacity is noted concerning for atelectasis or infiltrate. Probable left basilar subsegmental atelectasis is noted. The visualized skeletal structures are unremarkable. IMPRESSION: Mild right midlung opacity is noted concerning for atelectasis or infiltrate. Probable left basilar subsegmental atelectasis is noted. Followup radiographs are recommended until resolution.  Aortic Atherosclerosis (ICD10-I70.0). Electronically Signed   By: Marijo Conception M.D.   On: 07/05/2019 16:54     Medical Consultants:    None.  Anti-Infectives:  IV remdesivir Subjective:    [name] nonverbal today.  Objective:    Vitals:   07/06/19 1800 07/06/19 2016 07/06/19 2352 07/07/19 0300  BP: 105/64 (!) 114/53 (!) 107/56 119/66   Pulse: 66 74 69 73  Resp: 16 18 19 19   Temp:  (!) 96.4 F (35.8 C) (!) 97.2 F (36.2 C) (!) 97.1 F (36.2 C)  TempSrc:  Axillary Axillary Axillary  SpO2: 96% 94% 95% 96%  Weight:      Height:       SpO2: 96 % O2 Flow Rate (L/min): 2 L/min   Intake/Output Summary (Last 24 hours) at 07/07/2019 [id number] Last data filed at 07/07/2019 0600 Gross per 24 hour  Intake 3033.59 ml  Output 1600 ml  Net 1433.59 ml   Filed Weights   07/06/19 1640  Weight: 76.3 kg    Exam: General exam: In no acute distress. Respiratory system: Good air movement and clear to auscultation. Cardiovascular system: S1 & S2 heard, RRR. No JVD. Gastrointestinal system: Abdomen is nondistended, soft and nontender.  Central nervous system: Alert and oriented. No focal neurological deficits. Extremities: No pedal edema. Skin: No rashes, lesions or ulcers  Data Reviewed:    Labs: Basic Metabolic Panel: Recent Labs  Lab 07/05/19 1536 07/06/19 0204 07/07/19 0033  NA 142 143 144  K 4.3 4.9 4.6  CL 104 105 105  CO2 29 30 29   GLUCOSE 242* 193* 226*  BUN 30* 29* 36*  CREATININE 1.48* 1.29* 1.12  CALCIUM 8.9 8.9 8.9  MG  --   --  2.1   GFR Estimated Creatinine Clearance: 42.6 mL/min (by C-G formula based on SCr of 1.12 mg/dL). Liver Function Tests: Recent Labs  Lab 07/05/19 1536 07/06/19 0204 07/07/19 0033  AST 32 35 47*  ALT 31 30 38  ALKPHOS 68 69 59  BILITOT 0.3 0.7 0.2*  PROT 6.3* 6.5 6.2*  ALBUMIN 3.1* 3.2* 2.9*   No results for input(s): LIPASE, AMYLASE in the last 168 hours. No results for input(s): AMMONIA in the last 168 hours. Coagulation profile No results for input(s): INR, PROTIME in the last 168 hours. COVID-19 Labs  Recent Labs    07/05/19 1536 07/06/19 0204 07/07/19 0033  DDIMER 3.92* 3.59* 2.39*  FERRITIN 824* 1,048* 1,048*  LDH 196*  --   --   CRP 4.4* 6.1* 3.8*    Lab Results  Component Value Date   SARSCOV2NAA NEGATIVE 06/12/2019   [place] NEGATIVE  06/01/2019    CBC: Recent Labs  Lab 07/05/19 1536 07/06/19 0204 07/07/19 0033  WBC 3.8* 4.0 2.6*  NEUTROABS 2.2 2.9 1.9  HGB 9.7* 10.5* 9.0*  HCT 31.4* 34.1* 29.1*  MCV 96.3 96.6 95.4  PLT 170 162 179   Cardiac Enzymes: No results for input(s): CKTOTAL, CKMB, CKMBINDEX, TROPONINI in the last 168 hours. BNP (last 3 results) No results for input(s): PROBNP in the last 8760 hours. CBG: Recent Labs  Lab 07/06/19 0050 07/06/19 0808 07/06/19 1156 07/06/19 1725 07/06/19 2107  GLUCAP 157* 182* 164* 134* 180*   D-Dimer: Recent Labs    07/06/19 0204 07/07/19 0033  DDIMER 3.59* 2.39*   Hgb A1c: No results for input(s): HGBA1C in the last 72 hours. Lipid Profile: Recent Labs    07/05/19 1536  TRIG 126   Thyroid function studies: No  results for input(s): TSH, T4TOTAL, T3FREE, THYROIDAB in the last 72 hours.  Invalid input(s): FREET3 Anemia work up: Recent Labs    07/06/19 0204 07/07/19 0033  FERRITIN 1,048* 1,048*   Sepsis Labs: Recent Labs  Lab 07/05/19 1536 07/06/19 0147 07/06/19 0204 07/07/19 0033  PROCALCITON <0.10  --   --   --   WBC 3.8*  --  4.0 2.6*  LATICACIDVEN 1.9 1.4  --   --    Microbiology Recent Results (from the past 240 hour(s))  Blood Culture (routine x 2)     Status: None (Preliminary result)   Collection Time: 07/05/19  4:03 PM   Specimen: BLOOD RIGHT HAND  Result Value Ref Range Status   Specimen Description BLOOD RIGHT HAND  Final   Special Requests   Final    BOTTLES DRAWN AEROBIC AND ANAEROBIC Blood Culture results may not be optimal due to an inadequate volume of blood received in culture bottles   Culture   Final    NO GROWTH < 24 HOURS Performed at [facility], [place] [address] [place], [place] [address]    Report Status PENDING  Incomplete  Blood Culture (routine x 2)     Status: None (Preliminary result)   Collection Time: 07/05/19  4:05 PM   Specimen: BLOOD  Result Value Ref Range Status   Specimen Description  BLOOD LEFT ANTECUBITAL  Final   Special Requests   Final    BOTTLES DRAWN AEROBIC AND ANAEROBIC Blood Culture results may not be optimal due to an inadequate volume of blood received in culture bottles   Culture   Final    NO GROWTH < 24 HOURS Performed at [facility], [place] [address] [place], [place] [address]    Report Status PENDING  Incomplete  MRSA PCR Screening     Status: None   Collection Time: 07/06/19  5:08 PM   Specimen: Nasal Mucosa; Nasopharyngeal  Result Value Ref Range Status   MRSA by PCR NEGATIVE NEGATIVE Final    Comment:        The GeneXpert MRSA Assay (FDA approved for NASAL specimens only), is one component of a comprehensive MRSA colonization surveillance program. It is not intended to diagnose MRSA infection nor to guide or monitor treatment for MRSA infections. Performed at [facility], [place] [address], [place], [place] [address]      Medications:   . albuterol  2 puff Inhalation Q6H  . amLODipine  5 mg Oral Daily  . apixaban  5 mg Oral BID  . aspirin EC  81 mg Oral Daily  . dexamethasone (DECADRON) injection  6 mg Intravenous Q24H  . memantine  28 mg Oral QHS   And  . donepezil  10 mg Oral QHS  . escitalopram  5 mg Oral Daily  . feeding supplement (ENSURE ENLIVE)  237 mL Oral BID BM  . gabapentin  100 mg Oral QHS  . insulin aspart  0-5 Units Subcutaneous QHS  . insulin aspart  0-9 Units Subcutaneous TID WC  . insulin glargine  10 Units Subcutaneous QHS  . loratadine  10 mg Oral Daily  . mouth rinse  15 mL Mouth Rinse BID  . multivitamin with minerals  1 tablet Oral Daily  . tamsulosin  0.4 mg Oral QPC supper   Continuous Infusions: . sodium chloride 50 mL/hr at 07/06/19 1800  . azithromycin 500 mg (07/06/19 2044)  . cefTRIAXone (ROCEPHIN)  IV Stopped (07/06/19 1459)  .  remdesivir 100 mg in NS 100 mL Stopped (07/06/19 1220)      LOS: 2 days   Charlynne Cousins  [facility]  07/07/2019, 7:12 AM

## 2019-07-07 NOTE — Evaluation (Signed)
Clinical/Bedside Swallow Evaluation Patient Details  Name: Locryn Biggers MRN: [id number] Date of Birth: Oct 06, 1921  Today's Date: 07/07/2019 Time: SLP Start Time (ACUTE ONLY): [id number] SLP Stop Time (ACUTE ONLY): 1300 SLP Time Calculation (min) (ACUTE ONLY): 15 min  Past Medical History:  Past Medical History:  Diagnosis Date  . Arthritis   . CAD (coronary artery disease)    s/p DES to LAD 06/2013  . Dementia with behavioral disturbance ([place])   . Depression   . Dyslipidemia   . Glaucoma   . Hyperglycemia 04/17/2014  . Hyperlipidemia   . Hypertension   . Stroke ([place])   . Type 2 diabetes mellitus ([place])    Past Surgical History:  Past Surgical History:  Procedure Laterality Date  . CARDIAC CATHETERIZATION  06/2013   DES to LAD by Dr Tamala Julian  . Right Cataract     HPI:  Jerman Enamorado is an 85 y.o. male past medical history of diabetes mellitus type 2, essential hypertension coronary artery disease history of stroke dementia presents to the ED with several days of cough and shortness of breath.  80% on room air was brought into the ED placed on 5 L of oxygen his saturations improved, chest x-ray showed bilateral opacities, SARS-CoV-2 PCR was positive   Assessment / Plan / Recommendation Clinical Impression  Pt demonstrates signs of aspiration with swallowing, primarily related to severe impulsivity. Pt will not allow single sips and will fight to hang onto a straw or cup until it is empty. He takes rapid consecutive swallow with ill timed breathing. There are overt instances of coughing and wet vocal quality. When spoon feeding nectar thick liquids pt is easily paced and does not have signs of aspiration. He is very willing to consume sweets, but is also able to masticate soft solids like cooked vegetables. Will downgrade diet to fine chop and nectar thick liquids via teaspoon. Discussed strategies with RN.  SLP Visit Diagnosis: Dysphagia, oropharyngeal phase (R13.12);Dysphagia, unspecified  (R13.10)    Aspiration Risk  Moderate aspiration risk    Diet Recommendation Dysphagia 2 (Fine chop);Nectar-thick liquid   Liquid Administration via: Spoon Medication Administration: Crushed with puree Supervision: Staff to assist with self feeding;Full supervision/cueing for compensatory strategies Compensations: Slow rate;Small sips/bites Postural Changes: Seated upright at 90 degrees    Other  Recommendations Oral Care Recommendations: Oral care BID Other Recommendations: Have oral suction available   Follow up Recommendations 24 hour supervision/assistance      Frequency and Duration min 2x/week  2 weeks       Prognosis Prognosis for Safe Diet Advancement: Good Barriers to Reach Goals: Cognitive deficits      Swallow Study   General HPI: Hiroki Hyppolite is an 85 y.o. male past medical history of diabetes mellitus type 2, essential hypertension coronary artery disease history of stroke dementia presents to the ED with several days of cough and shortness of breath.  80% on room air was brought into the ED placed on 5 L of oxygen his saturations improved, chest x-ray showed bilateral opacities, SARS-CoV-2 PCR was positive Type of Study: Bedside Swallow Evaluation Previous Swallow Assessment: none Diet Prior to this Study: Dysphagia 3 (soft);Thin liquids Temperature Spikes Noted: No Respiratory Status: Room air History of Recent Intubation: No Behavior/Cognition: Alert;Cooperative Oral Cavity Assessment: Within Functional Limits Oral Care Completed by SLP: No Oral Cavity - Dentition: Edentulous Vision: Functional for self-feeding Self-Feeding Abilities: Needs assist Patient Positioning: Upright in bed Baseline Vocal Quality: Normal Volitional Cough: Cognitively unable to elicit Volitional  Swallow: Unable to elicit    Oral/Motor/Sensory Function Overall Oral Motor/Sensory Function: Other (comment)(doest follow commands but strong)   Ice Chips Ice chips: Not tested    Thin Liquid Thin Liquid: Impaired Presentation: Straw;Self Fed;Cup Pharyngeal  Phase Impairments: Cough - Immediate    Nectar Thick Nectar Thick Liquid: Impaired Presentation: Spoon;Straw Pharyngeal Phase Impairments: Cough - Immediate   Honey Thick Honey Thick Liquid: Not tested   Puree Puree: Within functional limits Presentation: Spoon   Solid     Solid: Within functional limits Presentation: Spoon     Herbie [place], MA [place] Pager [phone] Office [phone]  Kristyn Obyrne, Katherene Ponto 07/07/2019,3:59 PM

## 2019-07-08 LAB — GLUCOSE, CAPILLARY
Glucose-Capillary: 162 mg/dL — ABNORMAL HIGH (ref 70–99)
Glucose-Capillary: 133 mg/dL — ABNORMAL HIGH (ref 70–99)
Glucose-Capillary: 204 mg/dL — ABNORMAL HIGH (ref 70–99)
Glucose-Capillary: 216 mg/dL — ABNORMAL HIGH (ref 70–99)

## 2019-07-08 NOTE — Progress Notes (Signed)
Occupational Therapy Evaluation Patient Details Name: [name] MRN: [id number] DOB: 1922/03/27 Today's Date: 07/08/2019    History of Present Illness [name] is an 85 y.o. male past medical history of diabetes mellitus type 2, essential hypertension coronary artery disease history of stroke dementia presents to the ED with several days of cough and shortness of breath.  80% on room air was brought into the ED placed on 5 L of oxygen his saturations improved, chest x-ray showed bilateral opacities, SARS-CoV-2 PCR was positive. Per H&P: "Patient was recently at rehab facility over at [facility] where he apparently tested positive for the Covid 19 virus went home on December 1 but family were unaware."    Clinical Impression   PLOF obtained from chart. PTA pt lived with his daughter, requiring assist for ADLs and transfers. Pt currently requires min to total assist for self-care and functional transfer tasks. Pt tolerated sitting EOB 20+ min noting several instances of retro LOB with pt requiring cues and assist to self-correct. Pt tolerated standing four times with RW and max assist. Noted continual retro lean in standing with pt requiring max verbal and tactile cues to lean forward with RW. Attempted transfer to bedside chair with 2 person assist, however pt unable to follow commands, sitting without warning. Due to safety concerns recommended to nursing staff use of sara stedy for OOB transfers. Pt's O2 SATs maintained in 90s throughout on RA. Pt demonstrates decreased cognition, ROM, strength, endurance, balance, standing tolerance, activity tolerance, and safety awareness impacting ability to complete self-care and functional transfer tasks. Recommend skilled OT services to address above deficits in order to promote function and prevent further decline. Recommend SNF placement for additional rehab prior to discharge home.     Follow Up Recommendations  SNF    Equipment Recommendations  Other (comment)(TBD)    Recommendations for Other Services       Precautions / Restrictions Precautions Precautions: Fall;Other (comment)(Dementia with behavioral disturbances) Restrictions Weight Bearing Restrictions: No      Mobility Bed Mobility Overal bed mobility: Needs Assistance Bed Mobility: Supine to Sit;Sit to Supine     Supine to sit: Mod assist;HOB elevated Sit to supine: Mod assist   General bed mobility comments: Mod to max verbal and tactile cues.   Transfers Overall transfer level: Needs assistance Equipment used: Rolling walker (2 wheeled) Transfers: Sit to/from Stand Sit to Stand: Max assist         General transfer comment: Pt requires max assist x 1 for sit/stand from bed with RW. Pt able to complete 4 sit/stands with max assist. Attempted stand pivot transfer to bedside chair with RW and assist x 2, however pt unable to follow commands impulsively sitting without warning. Due to safety concerns recommended to nursing staff use of sara stedy for OOB transfers.     Balance Overall balance assessment: Needs assistance   Sitting balance-Leahy Scale: Fair       Standing balance-Leahy Scale: Poor                             ADL either performed or assessed with clinical judgement   ADL Overall ADL's : Needs assistance/impaired Eating/Feeding: Moderate assistance;Sitting   Grooming: Minimal assistance;Wash/dry face;Wash/dry hands;Sitting   Upper Body Bathing: Moderate assistance;Maximal assistance;Sitting   Lower Body Bathing: Total assistance;Sitting/lateral leans;Sit to/from stand   Upper Body Dressing : Moderate assistance;Maximal assistance;Sitting   Lower Body Dressing: Total assistance;Sit to/from stand;Sitting/lateral leans  Toilet Transfer: Maximal assistance;+2 for physical assistance;+2 for safety/equipment;BSC   Toileting- Clothing Manipulation and Hygiene: Maximal assistance;Sitting/lateral lean;Sit to/from stand        Functional mobility during ADLs: Maximal assistance;+2 for physical assistance;+2 for safety/equipment;Rolling walker       Vision         Perception     Praxis      Pertinent Vitals/Pain Pain Assessment: Faces Faces Pain Scale: No hurt     Hand Dominance Right   Extremity/Trunk Assessment Upper Extremity Assessment Upper Extremity Assessment: Generalized weakness;RUE deficits/detail(Pt unable to follow AROM instructions.) RUE Deficits / Details: Decreased PROM compared to left. Requires assist to initiate movements with RUE.   Lower Extremity Assessment Lower Extremity Assessment: Defer to PT evaluation       Communication Communication Communication: HOH   Cognition Arousal/Alertness: Awake/alert Behavior During Therapy: Flat affect Overall Cognitive Status: History of cognitive impairments - at baseline(Hx of dementia with behavioral disturbances.)                                     General Comments  Pt's O2 SATs maintained in 90s throughout on RA. Poor safety awareness. Unable to follow one step commands consistently.     Exercises Exercises: Other exercises Other Exercises Other Exercises: Incentive spirometer x 10 with mod to max cues on technique. Pulls 548mL Other Exercises: Flutter valve. Pt unable to comprehend instructions continually sucking in instead of blowing.    Shoulder Instructions      Home Living Family/patient expects to be discharged to:: Unsure Living Arrangements: Children                               Additional Comments: PLOF obtained from chart. Per records pt lived with his daughter.      Prior Functioning/Environment Level of Independence: Needs assistance  Gait / Transfers Assistance Needed: Per chart, required modified independent to supervision for mobility. ADL's / Homemaking Assistance Needed: Per chart, daughter assists him with all ADLs.            OT Problem List: Decreased  strength;Decreased range of motion;Decreased activity tolerance;Impaired balance (sitting and/or standing);Decreased safety awareness;Decreased knowledge of use of DME or AE;Cardiopulmonary status limiting activity      OT Treatment/Interventions: Self-care/ADL training;Therapeutic exercise;Neuromuscular education;Energy conservation;DME and/or AE instruction;Therapeutic activities;Patient/family education;Balance training    OT Goals(Current goals can be found in the care plan section) Acute Rehab OT Goals Patient Stated Goal: Unable to state Time For Goal Achievement: 07/22/19 Potential to Achieve Goals: Fair ADL Goals Pt Will Perform Eating: with set-up;sitting Pt Will Perform Grooming: with set-up;sitting Pt Will Transfer to Toilet: with min guard assist;bedside commode Pt Will Perform Toileting - Clothing Manipulation and hygiene: with min assist;sit to/from stand Additional ADL Goal #1: Pt to demonstrate breathing exercises with min cues on technique.  OT Frequency: Min 2X/week   Barriers to D/C:            Co-evaluation              AM-PAC OT "6 Clicks" Daily Activity     Outcome Measure Help from another person eating meals?: A Lot Help from another person taking care of personal grooming?: A Little Help from another person toileting, which includes using toliet, bedpan, or urinal?: A Lot Help from another person bathing (including washing, rinsing, drying)?: A Lot  Help from another person to put on and taking off regular upper body clothing?: A Lot Help from another person to put on and taking off regular lower body clothing?: Total 6 Click Score: 12   End of Session Equipment Utilized During Treatment: Gait belt;Rolling walker Nurse Communication: Mobility status  Activity Tolerance: Patient limited by fatigue(Limited by cognitive status.) Patient left: in bed;with call bell/phone within reach;with bed alarm set  OT Visit Diagnosis: Unsteadiness on feet  (R26.81);Muscle weakness (generalized) (M62.81)                Time: [id number] OT Time Calculation (min): 39 min Charges:  OT General Charges $OT Visit: 1 Visit OT Evaluation $OT Eval Moderate Complexity: 1 Mod OT Treatments $Self Care/Home Management : 8-22 mins $Therapeutic Activity: 8-22 mins  Mauri Brooklyn OTR/L [phone]   Mauri Brooklyn 07/08/2019, 11:31 AM

## 2019-07-08 NOTE — TOC Initial Note (Signed)
Transition of Care [facility]) - Initial/Assessment Note    Patient Details  Name: [name] MRN: [id number] Date of Birth: 23-Apr-1922  Transition of Care [facility]) CM/SW Contact:    Kirstie Peri, [place] Work Phone Number: 07/08/2019, 12:21 PM  Clinical Narrative:                 MSW Intern contacted Pt's daughter to discuss discharge planning. Daughter, [name], was very upset while speaking with SW. PT was discharged from [facility], after having contracted the Coronavirus. PT's daughter stated that Elvina Sidle was far more concerned about discharging her father, than finding some place safe for him, and she blames the hospital and [facility] for infecting him. When asked about placement options, [name] was defensive and asked if I planned to send her father to "another nasty place that will make him sick". MSW Intern assured her that SW had no intention of sending her father to any place with a Covid outbreak. [name] continued to talk about how angry she is and how she feels as if she keeps being lied to. She stated that she is so angry she "could punch somebody". She also stated that pt was worse off physically after getting out of [facility]. Options were discussed about limited places that the Pt could be accepted, and if [facility] would be a better option. [name] would like pt to be faxed out and she wants to research the places that he is accepted to. If she does not like the options she will take her father home and do her best.  Daughter states absolutely no [facility] or [place] [facility] [place]. Her concern with HH is that last time they only came out twice a week. Assessment and FL2 will be completed and pt will be faxed out. SW will continue to follow.  Expected Discharge Plan: Skilled Nursing Facility Barriers to Discharge: Continued Medical Work up   Patient Goals and CMS Choice Patient states their goals for this hospitalization and ongoing recovery are:: Patient unable to participate in goal planning due  to disorientation. CMS Medicare.gov Compare Post Acute Care list provided to:: Patient Represenative (must comment) Choice offered to / list presented to : Adult Children  Expected Discharge Plan and Services Expected Discharge Plan: [place] In-house Referral: Clinical Social Work   Post Acute Care Choice: [place] Living arrangements for the past 2 months: [place]                                      Prior Living Arrangements/Services Living arrangements for the past 2 months: Single Family Home Lives with:: Adult Children Patient language and need for interpreter reviewed:: Yes Do you feel safe going back to the place where you live?: Yes      Need for Family Participation in Patient Care: Yes (Comment) Care giver support system in place?: Yes (comment)   Criminal Activity/Legal Involvement Pertinent to Current Situation/Hospitalization: No - Comment as needed  Activities of Daily Living Home Assistive Devices/Equipment: Wheelchair, Environmental consultant (specify type)(Bedside commode, cane) ADL Screening (condition at time of admission) Patient's cognitive ability adequate to safely complete daily activities?: No Is the patient deaf or have difficulty hearing?: Yes Does the patient have difficulty seeing, even when wearing glasses/contacts?: No Does the patient have difficulty concentrating, remembering, or making decisions?: Yes Patient able to express need for assistance with ADLs?: No Does the patient have  difficulty dressing or bathing?: Yes Independently performs ADLs?: No Communication: Independent Dressing (OT): Dependent Is this a change from baseline?: Pre-admission baseline Grooming: Dependent Is this a change from baseline?: Pre-admission baseline Feeding: Needs assistance Is this a change from baseline?: Pre-admission baseline Bathing: Dependent Is this a change from baseline?: Pre-admission baseline Toileting:  Dependent Is this a change from baseline?: Pre-admission baseline In/Out Bed: Needs assistance, Dependent Is this a change from baseline?: Change from baseline, expected to last >3 days Walks in Home: Dependent Is this a change from baseline?: Change from baseline, expected to last >3 days Does the patient have difficulty walking or climbing stairs?: Yes Weakness of Legs: Both Weakness of Arms/Hands: Both  Permission Sought/Granted Permission sought to share information with : Facility Art therapist granted to share information with : Yes, Verbal Permission Granted  Share Information with NAME: [name]     Permission granted to share info w Relationship: Daughter  Permission granted to share info w Contact Information: [phone]  Emotional Assessment Appearance:: Other (Comment Required(Unable to Assess)   Affect (typically observed): Unable to Assess Orientation: : Oriented to Self Alcohol / Substance Use: Not Applicable Psych Involvement: No (comment)  Admission diagnosis:  Acute respiratory failure with hypoxia (HCC) [J96.01] Acute respiratory disease due to COVID-19 virus [U07.1, J06.9] Pneumonia due to 2019 novel coronavirus [U07.1, J12.89] Patient Active Problem List   Diagnosis Date Noted  . Pneumonia due to 2019 novel coronavirus 07/06/2019  . Pneumonia due to COVID-19 virus 07/05/2019  . Pulmonary embolism ([place]) 06/01/2019  . Acute renal failure superimposed on stage 3 chronic kidney disease ([place]) 06/01/2019  . Chronic diastolic CHF (congestive heart failure) ([place]) 06/01/2019  . Pain due to onychomycosis of toenails of both feet 01/23/2019  . Bilateral lower extremity edema 07/05/2017  . Pulmonary nodules 04/13/2017  . Centrilobular emphysema ([place]) 04/13/2017  . Calculus of gallbladder without cholecystitis without obstruction 04/13/2017  . Chronic cough 04/13/2017  . Adenoma of left adrenal gland 04/13/2017  . Frequent falls 04/01/2017  . Type  2 diabetes mellitus with stage 3 chronic kidney disease, with long-term current use of insulin ([place]) 04/01/2017  . History of tobacco abuse 04/01/2017  . Unsteady gait 04/01/2017  . Dysarthria 04/01/2017  . History of stroke   . Hyperlipidemia   . Moderate dementia with behavioral disturbance ([place]) 09/19/2015  . Depression 09/19/2015  . Hypertensive heart disease 09/10/2015  . Type 2 diabetes, controlled, with peripheral circulatory disorder ([place])   . Essential hypertension   . Coronary artery disease involving native coronary artery of native heart with angina pectoris ([place])   . Arthritis   . Glaucoma    PCP:  Gayland Curry, DO Pharmacy:   [facility] Delivery - [place], [place] [address] Phone: [phone] Fax: [phone]     Social Determinants of Health (SDOH) Interventions    Readmission Risk Interventions No flowsheet data found.

## 2019-07-08 NOTE — Plan of Care (Signed)
  Problem: Respiratory: Goal: Will maintain a patent airway Outcome: Progressing   Problem: Respiratory: Goal: Complications related to the disease process, condition or treatment will be avoided or minimized Outcome: Progressing   

## 2019-07-08 NOTE — NC FL2 (Signed)
[place] LEVEL OF CARE SCREENING TOOL     IDENTIFICATION  Patient Name: [name] Birthdate: 05/26/1922 Sex: male Admission Date (Current Location): 07/05/2019  [facility] and [place] Number:  Herbalist and Address:  Paulla Fore)      Provider Number: [id number]  Attending Physician Name and Address:  Charlynne Cousins, MD  Relative Name and Phone Number:  Breton Romine, [phone]    Current Level of Care: Hospital Recommended Level of Care: [place] Prior Approval Number:    Date Approved/Denied:   PASRR Number:    Discharge Plan: SNF    Current Diagnoses: Patient Active Problem List   Diagnosis Date Noted  . Pneumonia due to 2019 novel coronavirus 07/06/2019  . Pneumonia due to COVID-19 virus 07/05/2019  . Pulmonary embolism ([place]) 06/01/2019  . Acute renal failure superimposed on stage 3 chronic kidney disease ([place]) 06/01/2019  . Chronic diastolic CHF (congestive heart failure) ([place]) 06/01/2019  . Pain due to onychomycosis of toenails of both feet 01/23/2019  . Bilateral lower extremity edema 07/05/2017  . Pulmonary nodules 04/13/2017  . Centrilobular emphysema ([place]) 04/13/2017  . Calculus of gallbladder without cholecystitis without obstruction 04/13/2017  . Chronic cough 04/13/2017  . Adenoma of left adrenal gland 04/13/2017  . Frequent falls 04/01/2017  . Type 2 diabetes mellitus with stage 3 chronic kidney disease, with long-term current use of insulin ([place]) 04/01/2017  . History of tobacco abuse 04/01/2017  . Unsteady gait 04/01/2017  . Dysarthria 04/01/2017  . History of stroke   . Hyperlipidemia   . Moderate dementia with behavioral disturbance ([place]) 09/19/2015  . Depression 09/19/2015  . Hypertensive heart disease 09/10/2015  . Type 2 diabetes, controlled, with peripheral circulatory disorder ([place])   . Essential hypertension   . Coronary artery disease involving native coronary artery of native heart  with angina pectoris ([place])   . Arthritis   . Glaucoma     Orientation RESPIRATION BLADDER Height & Weight     Self  Normal Indwelling catheter Weight: 168 lb 3.4 oz (76.3 kg) Height:  6' (182.9 cm)(estimated)  BEHAVIORAL SYMPTOMS/MOOD NEUROLOGICAL BOWEL NUTRITION STATUS      Incontinent Diet(Mechanical Soft)  AMBULATORY STATUS COMMUNICATION OF NEEDS Skin   Extensive Assist Non-Verbally(Difficulty speaking, gestures and nods appropriately.)                         Personal Care Assistance Level of Assistance  Bathing, Feeding, Dressing Bathing Assistance: Maximum assistance Feeding assistance: Limited assistance Dressing Assistance: Maximum assistance     Functional Limitations Info  Speech     Speech Info: Impaired(Difficulty speaking)    SPECIAL CARE FACTORS FREQUENCY  OT (By licensed OT), Speech therapy, PT (By licensed PT)     PT Frequency: 5x week OT Frequency: 5x week     Speech Therapy Frequency: 2x week      Contractures Contractures Info: Not present    Additional Factors Info  Code Status, Allergies, Isolation Precautions, Insulin Sliding Scale Code Status Info: Full Allergies Info: Lisinopril   Insulin Sliding Scale Info: Insulin Aspart (Novolog) 0-5 units @ bedtime,Insulin Aspart (Novolog) 0-9 units 3x a day, Insulin Glargine (Lantus) 10 units 2x a day Isolation Precautions Info: Covid Positive     Current Medications (07/08/2019):  This is the current hospital active medication list Current Facility-Administered Medications  Medication Dose Route Frequency Provider Last Rate Last Dose  . 0.9 %  sodium chloride infusion   Intravenous  Continuous Charlynne Cousins, MD 10 mL/hr at 07/07/19 1017    . acetaminophen (TYLENOL) tablet 650 mg  650 mg Oral Q6H PRN Gala Romney L, MD      . albuterol (VENTOLIN HFA) 108 (90 Base) MCG/ACT inhaler 2 puff  2 puff Inhalation Q6H Elwyn Reach, MD   2 puff at 07/08/19 [phone]  . albuterol (VENTOLIN HFA)  108 (90 Base) MCG/ACT inhaler 2 puff  2 puff Inhalation Q4H PRN Barb Merino, MD      . amLODipine (NORVASC) tablet 5 mg  5 mg Oral Daily Barb Merino, MD   5 mg at 07/08/19 0832  . apixaban (ELIQUIS) tablet 5 mg  5 mg Oral BID Elwyn Reach, MD   5 mg at 07/08/19 0831  . aspirin EC tablet 81 mg  81 mg Oral Daily Barb Merino, MD   81 mg at 07/08/19 [id number]  . chlorpheniramine-HYDROcodone (TUSSIONEX) 10-8 MG/5ML suspension 5 mL  5 mL Oral Q12H PRN Gala Romney L, MD      . dexamethasone (DECADRON) injection 6 mg  6 mg Intravenous Q24H Elwyn Reach, MD   6 mg at 07/07/19 1943  . memantine (NAMENDA XR) 24 hr capsule 28 mg  28 mg Oral QHS Barb Merino, MD   28 mg at 07/07/19 2243   And  . donepezil (ARICEPT) tablet 10 mg  10 mg Oral QHS Barb Merino, MD   10 mg at 07/07/19 2243  . escitalopram (LEXAPRO) tablet 5 mg  5 mg Oral Daily Barb Merino, MD   5 mg at 07/08/19 [id number]  . feeding supplement (ENSURE ENLIVE) (ENSURE ENLIVE) liquid 237 mL  237 mL Oral BID BM Elodia Florence., MD   237 mL at 07/08/19 [phone]  . gabapentin (NEURONTIN) capsule 100 mg  100 mg Oral QHS Barb Merino, MD   100 mg at 07/07/19 2243  . guaiFENesin-dextromethorphan (ROBITUSSIN DM) 100-10 MG/5ML syrup 10 mL  10 mL Oral Q4H PRN Elwyn Reach, MD   10 mL at 07/06/19 2048  . insulin aspart (novoLOG) injection 0-5 Units  0-5 Units Subcutaneous QHS Garba, Mohammad L, MD      . insulin aspart (novoLOG) injection 0-9 Units  0-9 Units Subcutaneous TID WC Elwyn Reach, MD   1 Units at 07/08/19 1228  . insulin glargine (LANTUS) injection 10 Units  10 Units Subcutaneous BID Charlynne Cousins, MD   10 Units at 07/08/19 1229  . loratadine (CLARITIN) tablet 10 mg  10 mg Oral Daily Barb Merino, MD   10 mg at 07/08/19 0831  . MEDLINE mouth rinse  15 mL Mouth Rinse BID Elodia Florence., MD   15 mL at 07/08/19 [phone]  . multivitamin with minerals tablet 1 tablet  1 tablet Oral Daily Barb Merino, MD    1 tablet at 07/08/19 [phone]  . ondansetron (ZOFRAN) tablet 4 mg  4 mg Oral Q6H PRN Elwyn Reach, MD       Or  . ondansetron (ZOFRAN) injection 4 mg  4 mg Intravenous Q6H PRN Elwyn Reach, MD      . remdesivir 100 mg in sodium chloride 0.9 % 100 mL IVPB  100 mg Intravenous Q24H Rumbarger, Rachel L, RPH 200 mL/hr at 07/08/19 0829 100 mg at 07/08/19 0829  . Resource ThickenUp Clear   Oral PRN Charlynne Cousins, MD      . tamsulosin [facility]) capsule 0.4 mg  0.4 mg Oral QPC supper Barb Merino, MD  0.4 mg at 07/07/19 [id number]     Discharge Medications: Please see discharge summary for a list of discharge medications.  Relevant Imaging Results:  Relevant Lab Results:   Additional Information ss# [place], [place] Work

## 2019-07-08 NOTE — Progress Notes (Signed)
[facility] PROGRESS NOTE    Progress Note  [name]  [id number] DOB: 06/29/1922 DOA: 07/05/2019 PCP: Gayland Curry, DO     Brief Narrative:   [name] is an 85 y.o. male past medical history of diabetes mellitus type 2, essential hypertension coronary artery disease history of stroke dementia presents to the ED with several days of cough and shortness of breath.  80% on room air was brought into the ED placed on 5 L of oxygen his saturations improved, chest x-ray showed bilateral opacities, SARS-CoV-2 PCR was positive  Assessment/Plan:   Acute respiratory failure with hypoxia due to Pneumonia due to COVID-19 virus He is now on room air satting greater than 94%. Continue IV remdesivir, steroids vitamin C and zinc. Keep the patient peripherally 16 hours a day, but not prone out of bed to chair. Physical therapy was consulted and awaiting evaluation.  Acute kidney injury: With a baseline creatinine of less than 1, likely prerenal azotemia in the setting of hydrochlorothiazide and decreased oral intake. Creatinine improved with IV fluid hydration continue current regimen.  Type 2 diabetes, controlled, with peripheral circulatory disorder (HCC) Continue long-acting insulin plus sliding scale. Fairly controlled.  History of pulmonary embolism: Continue Eliquis.  Essential hypertension: Blood pressure is fairly controlled continue to hold hydrochlorothiazide continue all other medications.  Dementia: Patient with advanced dementia significantly deconditioned. PT OT evaluation pending.    DVT prophylaxis: lovenox Family Communication:none Disposition Plan/Barrier to D/C: unable to determine Code Status:     Code Status Orders  (From admission, onward)         Start     Ordered   07/05/19 2010  Full code  Continuous     07/05/19 2010        Code Status History    Date Active Date Inactive Code Status Order ID Comments User Context   07/05/2019  1824 07/05/2019 2010 Full Code [id number]  Margarita Mail, PA-C ED   07/05/2019 1821 07/05/2019 1824 Full Code [id number]  Margarita Mail, PA-C ED   06/01/2019 2257 06/05/2019 2147 Full Code [id number]  Vianne Bulls, MD ED   04/17/2014 2008 04/19/2014 1418 Full Code [id number]  Shanda Howells, MD ED   Advance Care Planning Activity    Advance Directive Documentation     Most Recent Value  Type of Advance Directive  Healthcare Power of Attorney  Pre-existing out of facility DNR order (yellow form or pink MOST form)  -  "MOST" Form in Place?  -        IV Access:    Peripheral IV   Procedures and diagnostic studies:   No results found.   Medical Consultants:    None.  Anti-Infectives:  IV remdesivir Subjective:    [name] nonverbal today.  Objective:    Vitals:   07/07/19 1600 07/07/19 2003 07/08/19 0000 07/08/19 0400  BP: 106/60 135/76 115/62 111/87  Pulse: 70 86 72 69  Resp: 17 20 18 16   Temp: 98.2 F (36.8 C) 98.2 F (36.8 C) 97.8 F (36.6 C) 97.6 F (36.4 C)  TempSrc: Oral Oral Oral Oral  SpO2: 98% 97% 98% 97%  Weight:      Height:       SpO2: 97 % O2 Flow Rate (L/min): 2 L/min   Intake/Output Summary (Last 24 hours) at 07/08/2019 0737 Last data filed at 07/07/2019 2100 Gross per 24 hour  Intake 1380 ml  Output -  Net 1380 ml   Filed Weights   07/06/19  1640  Weight: 76.3 kg    Exam: General exam: In no acute distress. Respiratory system: Good air movement and clear to auscultation. Cardiovascular system: S1 & S2 heard, RRR. No JVD. Gastrointestinal system: Abdomen is nondistended, soft and nontender.  Central nervous system: Alert and oriented. No focal neurological deficits. Extremities: No pedal edema. Skin: No rashes, lesions or ulcers   Data Reviewed:    Labs: Basic Metabolic Panel: Recent Labs  Lab 07/05/19 1536 07/06/19 0204 07/07/19 0033  NA 142 143 144  K 4.3 4.9 4.6  CL 104 105 105  CO2 29 30 29   GLUCOSE 242*  193* 226*  BUN 30* 29* 36*  CREATININE 1.48* 1.29* 1.12  CALCIUM 8.9 8.9 8.9  MG  --   --  2.1   GFR Estimated Creatinine Clearance: 42.6 mL/min (by C-G formula based on SCr of 1.12 mg/dL). Liver Function Tests: Recent Labs  Lab 07/05/19 1536 07/06/19 0204 07/07/19 0033  AST 32 35 47*  ALT 31 30 38  ALKPHOS 68 69 59  BILITOT 0.3 0.7 0.2*  PROT 6.3* 6.5 6.2*  ALBUMIN 3.1* 3.2* 2.9*   No results for input(s): LIPASE, AMYLASE in the last 168 hours. No results for input(s): AMMONIA in the last 168 hours. Coagulation profile No results for input(s): INR, PROTIME in the last 168 hours. COVID-19 Labs  Recent Labs    07/05/19 1536 07/06/19 0204 07/07/19 0033  DDIMER 3.92* 3.59* 2.39*  FERRITIN 824* 1,048* 1,048*  LDH 196*  --   --   CRP 4.4* 6.1* 3.8*    Lab Results  Component Value Date   SARSCOV2NAA NEGATIVE 06/12/2019   [place] NEGATIVE 06/01/2019    CBC: Recent Labs  Lab 07/05/19 1536 07/06/19 0204 07/07/19 0033  WBC 3.8* 4.0 2.6*  NEUTROABS 2.2 2.9 1.9  HGB 9.7* 10.5* 9.0*  HCT 31.4* 34.1* 29.1*  MCV 96.3 96.6 95.4  PLT 170 162 179   Cardiac Enzymes: No results for input(s): CKTOTAL, CKMB, CKMBINDEX, TROPONINI in the last 168 hours. BNP (last 3 results) No results for input(s): PROBNP in the last 8760 hours. CBG: Recent Labs  Lab 07/06/19 1725 07/06/19 2107 07/07/19 0739 07/07/19 1127 07/07/19 2118  GLUCAP 134* 180* 191* 233* 165*   D-Dimer: Recent Labs    07/06/19 0204 07/07/19 0033  DDIMER 3.59* 2.39*   Hgb A1c: No results for input(s): HGBA1C in the last 72 hours. Lipid Profile: Recent Labs    07/05/19 1536  TRIG 126   Thyroid function studies: No results for input(s): TSH, T4TOTAL, T3FREE, THYROIDAB in the last 72 hours.  Invalid input(s): FREET3 Anemia work up: Recent Labs    07/06/19 0204 07/07/19 0033  FERRITIN 1,048* 1,048*   Sepsis Labs: Recent Labs  Lab 07/05/19 1536 07/06/19 0147 07/06/19 0204  07/07/19 0033  PROCALCITON <0.10  --   --   --   WBC 3.8*  --  4.0 2.6*  LATICACIDVEN 1.9 1.4  --   --    Microbiology Recent Results (from the past 240 hour(s))  Blood Culture (routine x 2)     Status: None (Preliminary result)   Collection Time: 07/05/19  4:03 PM   Specimen: BLOOD RIGHT HAND  Result Value Ref Range Status   Specimen Description BLOOD RIGHT HAND  Final   Special Requests   Final    BOTTLES DRAWN AEROBIC AND ANAEROBIC Blood Culture results may not be optimal due to an inadequate volume of blood received in culture bottles   Culture  Final    NO GROWTH 2 DAYS Performed at [facility], [place] [address], [place], [place] [address]    Report Status PENDING  Incomplete  Blood Culture (routine x 2)     Status: None (Preliminary result)   Collection Time: 07/05/19  4:05 PM   Specimen: BLOOD  Result Value Ref Range Status   Specimen Description BLOOD LEFT ANTECUBITAL  Final   Special Requests   Final    BOTTLES DRAWN AEROBIC AND ANAEROBIC Blood Culture results may not be optimal due to an inadequate volume of blood received in culture bottles   Culture   Final    NO GROWTH 2 DAYS Performed at [facility], [place] [address], [place], [place] [address]    Report Status PENDING  Incomplete  MRSA PCR Screening     Status: None   Collection Time: 07/06/19  5:08 PM   Specimen: Nasal Mucosa; Nasopharyngeal  Result Value Ref Range Status   MRSA by PCR NEGATIVE NEGATIVE Final    Comment:        The GeneXpert MRSA Assay (FDA approved for NASAL specimens only), is one component of a comprehensive MRSA colonization surveillance program. It is not intended to diagnose MRSA infection nor to guide or monitor treatment for MRSA infections. Performed at [facility], [place] [address], [place], [place] [address]      Medications:   . albuterol  2 puff Inhalation Q6H  . amLODipine  5 mg Oral Daily  . apixaban  5 mg Oral BID  . aspirin EC  81  mg Oral Daily  . dexamethasone (DECADRON) injection  6 mg Intravenous Q24H  . memantine  28 mg Oral QHS   And  . donepezil  10 mg Oral QHS  . escitalopram  5 mg Oral Daily  . feeding supplement (ENSURE ENLIVE)  237 mL Oral BID BM  . gabapentin  100 mg Oral QHS  . insulin aspart  0-5 Units Subcutaneous QHS  . insulin aspart  0-9 Units Subcutaneous TID WC  . insulin glargine  10 Units Subcutaneous BID  . loratadine  10 mg Oral Daily  . mouth rinse  15 mL Mouth Rinse BID  . multivitamin with minerals  1 tablet Oral Daily  . tamsulosin  0.4 mg Oral QPC supper   Continuous Infusions: . sodium chloride 10 mL/hr at 07/07/19 1017  . remdesivir 100 mg in NS 100 mL 100 mg (07/07/19 1207)      LOS: 3 days   Charlynne Cousins  [facility]  07/08/2019, 7:37 AM

## 2019-07-08 NOTE — Progress Notes (Signed)
Initial Nutrition Assessment  INTERVENTION:   -Ensure Enlive po BID, each supplement provides 350 kcal and 20 grams of protein  -Pt receiving Hormel Shake daily with Breakfast which provides 520 kcals and 22 g of protein and Magic cup BID with lunch and dinner, each supplement provides 290 kcal and 9 grams of protein, automatically on meal trays to optimize nutritional intake.  NUTRITION DIAGNOSIS:   Increased nutrient needs related to acute illness as evidenced by estimated needs.  GOAL:   Patient will meet greater than or equal to 90% of their needs  MONITOR:   PO intake, Supplement acceptance, Labs, Weight trends, I & O's  REASON FOR ASSESSMENT:   Malnutrition Screening Tool    ASSESSMENT:   85 y.o. male past medical history of diabetes mellitus type 2, essential hypertension coronary artery disease history of stroke dementia presents to the ED with several days of cough and shortness of breath.  80% on room air was brought into the ED placed on 5 L of oxygen his saturations improved, chest x-ray showed bilateral opacities, SARS-CoV-2 PCR was positive 12/3: COVID+  **RD working remotely**  Patient currently receiving treatment for active COVID-19 infection.  Patient currently alert/oriented x 1.  Per SLP evaluation on 12/5, pt showed signs of aspiration with swallowing. Recommended dysphagia 2 diet with nectar thick liquids.   Pt is consuming 50-75% of meals at this time. Pt is drinking Ensure supplements today.   Per weight records, pt weighed 185 lbs on 11/3 (9% wt loss x 1 month, significant for time frame).  I/Os: +2.7L since admit UOP: 600 ml x 24 hrs  Medications: Multivitamin with minerals daily  Labs reviewed: CBGs: 133-162  NUTRITION - FOCUSED PHYSICAL EXAM:  Working remotely.  Diet Order:   Diet Order            DIET DYS 2 Room service appropriate? Yes; Fluid consistency: Nectar Thick  Diet effective now              EDUCATION NEEDS:   No  education needs have been identified at this time  Skin:  Skin Assessment: Reviewed RN Assessment  Last BM:  12/6 -type 5  Height:   Ht Readings from Last 1 Encounters:  07/06/19 6' (1.829 m)    Weight:   Wt Readings from Last 1 Encounters:  07/06/19 76.3 kg    Ideal Body Weight:  80.9 kg  BMI:  Body mass index is 22.81 kg/m.  Estimated Nutritional Needs:   Kcal:  1700-1900  Protein:  70-80g  Fluid:  1.7L/day   Clayton Bibles, MS, RD, LDN Inpatient Clinical Dietitian Pager: [phone] After Hours Pager: [phone]

## 2019-07-08 NOTE — Progress Notes (Signed)
Family Updates  Primary RN spoke with patients daughter [name] concerning POC and discharge planning.

## 2019-07-09 DIAGNOSIS — J9601 Acute respiratory failure with hypoxia: Secondary | ICD-10-CM

## 2019-07-09 LAB — COMPREHENSIVE METABOLIC PANEL
ALT: 107 U/L — ABNORMAL HIGH (ref 0–44)
AST: 94 U/L — ABNORMAL HIGH (ref 15–41)
Albumin: 3.3 g/dL — ABNORMAL LOW (ref 3.5–5.0)
Alkaline Phosphatase: 62 U/L (ref 38–126)
Anion gap: 10 (ref 5–15)
BUN: 35 mg/dL — ABNORMAL HIGH (ref 8–23)
CO2: 30 mmol/L (ref 22–32)
Calcium: 9.3 mg/dL (ref 8.9–10.3)
Chloride: 103 mmol/L (ref 98–111)
Creatinine, Ser: 0.98 mg/dL (ref 0.61–1.24)
GFR calc Af Amer: >60 mL/min (ref >60)
GFR calc non Af Amer: >60 mL/min (ref >60)
Glucose, Bld: 91 mg/dL (ref 70–99)
Potassium: 4.7 mmol/L (ref 3.5–5.1)
Sodium: 143 mmol/L (ref 135–145)
Total Bilirubin: 0.3 mg/dL (ref 0.3–1.2)
Total Protein: 6.5 g/dL (ref 6.5–8.1)

## 2019-07-09 LAB — GLUCOSE, CAPILLARY
Glucose-Capillary: 158 mg/dL — ABNORMAL HIGH (ref 70–99)
Glucose-Capillary: 112 mg/dL — ABNORMAL HIGH (ref 70–99)
Glucose-Capillary: 225 mg/dL — ABNORMAL HIGH (ref 70–99)
Glucose-Capillary: 148 mg/dL — ABNORMAL HIGH (ref 70–99)
Glucose-Capillary: 156 mg/dL — ABNORMAL HIGH (ref 70–99)

## 2019-07-09 NOTE — TOC Progression Note (Signed)
Transition of Care [facility]) - Progression Note    Patient Details  Name: [name] MRN: [id number] Date of Birth: [birth year]  Transition of Care [facility]) CM/SW Contact  Joaquin Courts, RN Phone Number: 07/09/2019, 3:51 PM  Clinical Narrative:   CM received call from [employer] rep stating they contacted patient's daughter regarding equipment delivery and she stated she would not be able to receive the equipment  Until tomorrow afternoon. CM reached out to the daughter who confirms this. She states she needs to have her brothers come over and help her prepare the home for her dad's arrival and they are not available until tomorrow, she anticipates that she will be ready tomorrow afternoon.  MD notified of this.     Expected Discharge Plan: [place] Barriers to Discharge: Equipment Delay  Expected Discharge Plan and Services Expected Discharge Plan: [place] In-house Referral: Clinical Social Work Discharge Planning Services: CM Consult Post Acute Care Choice: [place] arrangements for the past 2 months: Single Family Home Expected Discharge Date: 07/09/19               DME Arranged: Other see comment, Hospital bed(hoyer lift) DME Agency: [employer] Date DME Agency Contacted: 07/09/19 Time DME Agency Contacted: [id number] Representative spoke with at DME Agency: [place]: Nurse's Aide, [place], PT [place] Agency: [facility] (formerly [employer]) Date [place]: 07/09/19 Time [place]: 1454 Representative spoke with at [place]: [place] ([place]) Interventions    Readmission Risk Interventions No flowsheet data found.

## 2019-07-09 NOTE — Progress Notes (Signed)
Family Update  Spoke with daughter [name] concerning POC and discharge planning. Daughter is concerned that patient will be discharged to "A nursing home full of Covid" and  "if patient does not get a bed in a facility she approves of she wants him sent home with her." Primary nurse educated patient on safe discharge and if patient is going home there has to be services in place to assist the daughter with his care. Primary nurse sent message to Rexene Alberts (covering SW) so she can address daughters concerns and work on discharge planning.

## 2019-07-09 NOTE — Discharge Summary (Signed)
Physician Discharge Summary  [name] [id number] DOB: 1922/07/22 DOA: 07/05/2019  PCP: Gayland Curry, DO  Admit date: 07/05/2019 Discharge date: 07/09/2019  Admitted From: home Disposition:  SNf  Recommendations for Outpatient Follow-up:  1. Follow up with PCP in 1-2 weeks 2. Please obtain BMP/CBC in one week   Home Health:No Equipment/Devices:None  Discharge Condition:Stable CODE STATUS:Full Diet recommendation: Heart Healthy  Brief/Interim Summary: 85 y.o. male past medical history of diabetes mellitus type 2, essential hypertension coronary artery disease history of stroke dementia presents to the ED with several days of cough and shortness of breath.  80% on room air was brought into the ED placed on 5 L of oxygen his saturations improved, chest x-ray showed bilateral opacities, SARS-CoV-2 PCR was positive  Discharge Diagnoses:  Principal Problem:   Pneumonia due to COVID-19 virus Active Problems:   Type 2 diabetes, controlled, with peripheral circulatory disorder (HCC)   Essential hypertension   Coronary artery disease involving native coronary artery of native heart with angina pectoris (HCC)   Moderate dementia with behavioral disturbance (HCC)   Frequent falls   Chronic diastolic CHF (congestive heart failure) (HCC)   Pneumonia due to 2019 novel coronavirus Acute respiratory failure with hypoxia due to COVID-19 viral pneumonia: Initially placed on 2 L of oxygen to keep saturations greater than 94% she was weaned to room air rather quickly. On admission he was started on IV steroids and remdesivir for which she completed his treatment in house. Inflammatory markers are improved. Physical therapy evaluated the patient and recommended skilled nursing facility.  Acute kidney injury: Likely prerenal azotemia in the setting of decreased oral intake, infectious etiology and hydrochlorothiazide use. His diuretic was held he was started on IV fluids creatinine  returned to baseline.  Diabetes mellitus type 2 with peripheral vascular disease: He is not taking his insulin regimen at home reliably.  History of pulmonary embolism: Continue Eliquis.  Essential hypertension: Antihypertensive medication were held on admission these were resumed and he will continue them as an outpatient.  Dementia without behavioral disturbances: No events.  No changes made to his medication.   Discharge Instructions  Discharge Instructions    Diet - low sodium heart healthy   Complete by: As directed    Increase activity slowly   Complete by: As directed      Allergies as of 07/09/2019      Reactions   Lisinopril Cough      Medication List    TAKE these medications   Accu-Chek Aviva Soln Use as Directed. Dx: E11.51   accu-chek softclix lancets Use as instructed with checking blood sugar three times daily. Dx: E11.51   albuterol 108 (90 Base) MCG/ACT inhaler Commonly known as: VENTOLIN HFA Inhale 2 puffs into the lungs every 4 (four) hours as needed for wheezing or shortness of breath. INHALE TWO PUFFS EVERY 4-6 HOURS AS NEEDED FOR DIFFICULTY BREATHING,WHEEZING OR COUGH   amLODipine 5 MG tablet Commonly known as: NORVASC Take 1 tablet (5 mg total) by mouth daily.   apixaban 5 MG Tabs tablet Commonly known as: ELIQUIS Take 5 mg by mouth 2 (two) times daily.   aspirin EC 81 MG tablet Take 1 tablet (81 mg total) by mouth daily.   B-D SINGLE USE SWABS REGULAR Pads USE AS DIRECTED WITH CHECKING BLOOD SUGAR THREE TIMES DAILY   escitalopram 5 MG tablet Commonly known as: LEXAPRO Take 1 tablet (5 mg total) by mouth daily.   gabapentin 100 MG capsule Commonly known as:  NEURONTIN Take 100 mg by mouth at bedtime.   glucose blood test strip Commonly known as: Accu-Chek Aviva Use to test blood sugar three times daily. Dx: E11.51   haloperidol 5 MG tablet Commonly known as: HALDOL Take 5 mg by mouth 4 (four) times daily as needed for  agitation.   hydrochlorothiazide 25 MG tablet Commonly known as: HYDRODIURIL Take 1 tablet (25 mg total) by mouth daily.   Insulin Pen Needle 31G X 5 MM Misc Commonly known as: B-D UF III MINI PEN NEEDLES USE AS DIRECTED.   Loratadine 10 MG Caps Take 1 capsule (10 mg total) by mouth daily.   losartan 50 MG tablet Commonly known as: COZAAR Take 1 tablet (50 mg total) by mouth daily.   Namzaric 28-10 MG Cp24 Generic drug: Memantine HCl-Donepezil HCl Take 1 capsule by mouth daily.   Sentry Senior Tabs Take 1 tablet by mouth daily.   simvastatin 20 MG tablet Commonly known as: ZOCOR Take 1 tablet (20 mg total) by mouth daily.   tamsulosin 0.4 MG Caps capsule Commonly known as: FLOMAX Take 1 capsule (0.4 mg total) by mouth daily after supper.   Toujeo SoloStar 300 UNIT/ML Sopn Generic drug: Insulin Glargine (1 Unit Dial) Inject 50 Units into the skin at bedtime.       Allergies  Allergen Reactions  . Lisinopril Cough    Consultations:  None   Procedures/Studies: Dg Shoulder Right  Result Date: 06/09/2019 CLINICAL DATA:  Suspected fall EXAM: RIGHT SHOULDER - 2+ VIEW COMPARISON:  None. FINDINGS: No fracture or dislocation of the right shoulder. There is moderate glenohumeral and acromioclavicular arthrosis with probable loose bodies in the glenohumeral joint space. Partially imaged right chest is unremarkable. IMPRESSION: No fracture or dislocation of the right shoulder. There is moderate glenohumeral and acromioclavicular arthrosis with probable loose bodies in the glenohumeral joint space. Electronically Signed   By: Eddie Candle M.D.   On: 06/09/2019 17:43   Dg Elbow Complete Right  Result Date: 06/09/2019 CLINICAL DATA:  Suspected fall EXAM: RIGHT ELBOW - COMPLETE 3+ VIEW COMPARISON:  None. FINDINGS: No definite fracture or dislocation of the right elbow. There may be a very subtle fracture fragment adjacent to the right radial head and a small elbow joint  effusion. There is enthesopathic change and heterotopic ossification about the olecranon. Soft tissues are unremarkable. IMPRESSION: No definite fracture or dislocation of the right elbow. There may be a very subtle fracture fragment adjacent to the right radial head and a small elbow joint effusion. Electronically Signed   By: Eddie Candle M.D.   On: 06/09/2019 17:48   Dg Chest Port 1 View  Result Date: 07/05/2019 CLINICAL DATA:  Shortness of breath. EXAM: PORTABLE CHEST 1 VIEW COMPARISON:  April 12, 2019. FINDINGS: The heart size and mediastinal contours are within normal limits. No pneumothorax or pleural effusion is noted. Mild right midlung opacity is noted concerning for atelectasis or infiltrate. Probable left basilar subsegmental atelectasis is noted. The visualized skeletal structures are unremarkable. IMPRESSION: Mild right midlung opacity is noted concerning for atelectasis or infiltrate. Probable left basilar subsegmental atelectasis is noted. Followup radiographs are recommended until resolution. Aortic Atherosclerosis (ICD10-I70.0). Electronically Signed   By: Marijo Conception M.D.   On: 07/05/2019 16:54   Dg Hip Unilat W Or Wo Pelvis 2-3 Views Right  Result Date: 06/09/2019 CLINICAL DATA:  Pain, suspected fall EXAM: DG HIP (WITH OR WITHOUT PELVIS) 2-3V RIGHT COMPARISON:  Radiograph January 18, 2019 FINDINGS: No evidence of  hip fracture or dislocation. Bones of the pelvis are congruent. Mild degenerative changes noted in the SI joints and both hips. Sclerotic changes of the femoral heads likely reflecting prior AVN. Additional discogenic changes are present in the included spine. Portions of the sacrum are obscured by overlying bowel gas. Phleboliths noted in the pelvis. Bowel gas pattern is normal. Vascular calcium noted in the medial thighs. IMPRESSION: No acute osseous abnormality. No evidence of hip fracture or dislocation. Chronic sclerotic changes of the femoral heads likely reflecting a  vascular necrosis. Electronically Signed   By: Lovena Le M.D.   On: 06/09/2019 17:48   Ue Venous Duplex (mc & Wl 7 Am - 7 Pm)  Result Date: 06/10/2019 UPPER VENOUS STUDY  Indications: Swelling Other Indications: Patient on Eliquis. Limitations: Patient's inability to turn arm. Comparison Study: No prior study on file Performing Technologist: Sharion Dove RVS  Examination Guidelines: A complete evaluation includes B-mode imaging, spectral Doppler, color Doppler, and power Doppler as needed of all accessible portions of each vessel. Bilateral testing is considered an integral part of a complete examination. Limited examinations for reoccurring indications may be performed as noted.  Right Findings: +----------+------------+---------+-----------+----------+--------------+ RIGHT     CompressiblePhasicitySpontaneousProperties   Summary     +----------+------------+---------+-----------+----------+--------------+ IJV           Full                                                 +----------+------------+---------+-----------+----------+--------------+ Subclavian               Yes       Yes                             +----------+------------+---------+-----------+----------+--------------+ Axillary                 Yes       Yes                             +----------+------------+---------+-----------+----------+--------------+ Brachial      Full       Yes       Yes                             +----------+------------+---------+-----------+----------+--------------+ Radial        Full                                                 +----------+------------+---------+-----------+----------+--------------+ Ulnar                                               Not visualized +----------+------------+---------+-----------+----------+--------------+ Cephalic      Full                                                  +----------+------------+---------+-----------+----------+--------------+ Basilic  Full                                                 +----------+------------+---------+-----------+----------+--------------+  Left Findings: +----------+------------+---------+-----------+----------+-------+ LEFT      CompressiblePhasicitySpontaneousPropertiesSummary +----------+------------+---------+-----------+----------+-------+ Subclavian               Yes       Yes                      +----------+------------+---------+-----------+----------+-------+  Summary:  Right: No evidence of deep vein thrombosis in the upper extremity. No evidence of superficial vein thrombosis in the upper extremity.  Left: No evidence of thrombosis in the subclavian.  *See table(s) above for measurements and observations.  Diagnosing physician: Curt Jews MD Electronically signed by Curt Jews MD on 06/10/2019 at 9:02:32 AM.    Final     (Echo, Carotid, EGD, Colonoscopy, ERCP)    Subjective: No complaints feels great.  Discharge Exam: Vitals:   07/09/19 0032 07/09/19 0426  BP: 113/73 122/72  Pulse: 72 73  Resp: 18 19  Temp: 98 F (36.7 C) 97.7 F (36.5 C)  SpO2: 97% 99%   Vitals:   07/08/19 1957 07/08/19 2008 07/09/19 0032 07/09/19 0426  BP: (!) 110/53 (!) 114/55 113/73 122/72  Pulse: 76 75 72 73  Resp: 17 18 18 19   Temp: 97.6 F (36.4 C) 98.2 F (36.8 C) 98 F (36.7 C) 97.7 F (36.5 C)  TempSrc: Oral Axillary Oral Oral  SpO2: 97% 97% 97% 99%  Weight:      Height:        General: Pt is alert, awake, not in acute distress Cardiovascular: RRR, S1/S2 +, no rubs, no gallops Respiratory: CTA bilaterally, no wheezing, no rhonchi Abdominal: Soft, NT, ND, bowel sounds + Extremities: no edema, no cyanosis    The results of significant diagnostics from this hospitalization (including imaging, microbiology, ancillary and laboratory) are listed below for reference.     Microbiology: Recent  Results (from the past 240 hour(s))  Blood Culture (routine x 2)     Status: None (Preliminary result)   Collection Time: 07/05/19  4:03 PM   Specimen: BLOOD RIGHT HAND  Result Value Ref Range Status   Specimen Description BLOOD RIGHT HAND  Final   Special Requests   Final    BOTTLES DRAWN AEROBIC AND ANAEROBIC Blood Culture results may not be optimal due to an inadequate volume of blood received in culture bottles   Culture   Final    NO GROWTH 3 DAYS Performed at [facility], [place] [address], [place], [place] [address]    Report Status PENDING  Incomplete  Blood Culture (routine x 2)     Status: None (Preliminary result)   Collection Time: 07/05/19  4:05 PM   Specimen: BLOOD  Result Value Ref Range Status   Specimen Description BLOOD LEFT ANTECUBITAL  Final   Special Requests   Final    BOTTLES DRAWN AEROBIC AND ANAEROBIC Blood Culture results may not be optimal due to an inadequate volume of blood received in culture bottles   Culture   Final    NO GROWTH 3 DAYS Performed at [facility], [place] [address] [place],  [address]    Report Status PENDING  Incomplete  MRSA PCR Screening     Status: None  Collection Time: 07/06/19  5:08 PM   Specimen: Nasal Mucosa; Nasopharyngeal  Result Value Ref Range Status   MRSA by PCR NEGATIVE NEGATIVE Final    Comment:        The GeneXpert MRSA Assay (FDA approved for NASAL specimens only), is one component of a comprehensive MRSA colonization surveillance program. It is not intended to diagnose MRSA infection nor to guide or monitor treatment for MRSA infections. Performed at [facility], [place] [address] [place], [place] [address]      Labs: BNP (last 3 results) Recent Labs    06/01/19 1921  BNP [id number]   Basic Metabolic Panel: Recent Labs  Lab 07/05/19 1536 07/06/19 0204 07/07/19 0033 07/09/19 0335  NA 142 143 144 143  K 4.3 4.9 4.6 4.7  CL 104 105 105 103  CO2 29 30 29 30   GLUCOSE  242* 193* 226* 91  BUN 30* 29* 36* 35*  CREATININE 1.48* 1.29* 1.12 0.98  CALCIUM 8.9 8.9 8.9 9.3  MG  --   --  2.1  --    Liver Function Tests: Recent Labs  Lab 07/05/19 1536 07/06/19 0204 07/07/19 0033 07/09/19 0335  AST 32 35 47* 94*  ALT 31 30 38 107*  ALKPHOS 68 69 59 62  BILITOT 0.3 0.7 0.2* 0.3  PROT 6.3* 6.5 6.2* 6.5  ALBUMIN 3.1* 3.2* 2.9* 3.3*   No results for input(s): LIPASE, AMYLASE in the last 168 hours. No results for input(s): AMMONIA in the last 168 hours. CBC: Recent Labs  Lab 07/05/19 1536 07/06/19 0204 07/07/19 0033  WBC 3.8* 4.0 2.6*  NEUTROABS 2.2 2.9 1.9  HGB 9.7* 10.5* 9.0*  HCT 31.4* 34.1* 29.1*  MCV 96.3 96.6 95.4  PLT 170 162 179   Cardiac Enzymes: No results for input(s): CKTOTAL, CKMB, CKMBINDEX, TROPONINI in the last 168 hours. BNP: Invalid input(s): POCBNP CBG: Recent Labs  Lab 07/07/19 2118 07/08/19 0809 07/08/19 1130 07/08/19 1606 07/08/19 2130  GLUCAP 165* 162* 133* 204* 216*   D-Dimer Recent Labs    07/07/19 0033  DDIMER 2.39*   Hgb A1c No results for input(s): HGBA1C in the last 72 hours. Lipid Profile No results for input(s): CHOL, HDL, LDLCALC, TRIG, CHOLHDL, LDLDIRECT in the last 72 hours. Thyroid function studies No results for input(s): TSH, T4TOTAL, T3FREE, THYROIDAB in the last 72 hours.  Invalid input(s): FREET3 Anemia work up Recent Labs    07/07/19 0033  FERRITIN 1,048*   Urinalysis    Component Value Date/Time   COLORURINE YELLOW 05/28/2019 [place] 05/28/2019 0640   LABSPEC 1.012 05/28/2019 0640   PHURINE 5.0 05/28/2019 0640   GLUCOSEU NEGATIVE 05/28/2019 0640   HGBUR NEGATIVE 05/28/2019 0640   BILIRUBINUR NEGATIVE 05/28/2019 0640   KETONESUR NEGATIVE 05/28/2019 0640   PROTEINUR NEGATIVE 05/28/2019 0640   UROBILINOGEN 0.2 04/17/2014 1756   NITRITE NEGATIVE 05/28/2019 0640   LEUKOCYTESUR NEGATIVE 05/28/2019 0640   Sepsis Labs Invalid input(s): PROCALCITONIN,  WBC,   LACTICIDVEN Microbiology Recent Results (from the past 240 hour(s))  Blood Culture (routine x 2)     Status: None (Preliminary result)   Collection Time: 07/05/19  4:03 PM   Specimen: BLOOD RIGHT HAND  Result Value Ref Range Status   Specimen Description BLOOD RIGHT HAND  Final   Special Requests   Final    BOTTLES DRAWN AEROBIC AND ANAEROBIC Blood Culture results may not be optimal due to an inadequate volume of blood received in culture bottles  Culture   Final    NO GROWTH 3 DAYS Performed at [facility], [place] [address] [place], [place] [address]    Report Status PENDING  Incomplete  Blood Culture (routine x 2)     Status: None (Preliminary result)   Collection Time: 07/05/19  4:05 PM   Specimen: BLOOD  Result Value Ref Range Status   Specimen Description BLOOD LEFT ANTECUBITAL  Final   Special Requests   Final    BOTTLES DRAWN AEROBIC AND ANAEROBIC Blood Culture results may not be optimal due to an inadequate volume of blood received in culture bottles   Culture   Final    NO GROWTH 3 DAYS Performed at [facility], [place] [address], [place], [place] [address]    Report Status PENDING  Incomplete  MRSA PCR Screening     Status: None   Collection Time: 07/06/19  5:08 PM   Specimen: Nasal Mucosa; Nasopharyngeal  Result Value Ref Range Status   MRSA by PCR NEGATIVE NEGATIVE Final    Comment:        The GeneXpert MRSA Assay (FDA approved for NASAL specimens only), is one component of a comprehensive MRSA colonization surveillance program. It is not intended to diagnose MRSA infection nor to guide or monitor treatment for MRSA infections. Performed at [facility], [place] [address] [place], [place] [address]      Time coordinating discharge: Over 40 minutes  SIGNED:   Charlynne Cousins, MD  [facility] 07/09/2019, 7:24 AM Pager   If 7PM-7AM, please contact night-coverage [email or web address] Password [email or web address]

## 2019-07-09 NOTE — Progress Notes (Signed)
  Speech Language Pathology Treatment: Dysphagia  Patient Details Name: [name] MRN: [id number] DOB: 26-Oct-1921 Today's Date: 07/09/2019 Time: [id number] SLP Time Calculation (min) (ACUTE ONLY): 25 min  Assessment / Plan / Recommendation Clinical Impression  Pt alert, participatory.  Repositioned in bed to optimize safety with eating.  Trials of thin liquids continue to elicit overt s/s of aspiration.  Pt demonstrated improved ability to manage drinking nectar-thick liquids from the edge of a cup without coughing and without quantity limited to teaspoon only. Phoned his daughter, [name], and provided updates re: his swallowing, the need to continue nectar-thick liquids, and the [facility] SLP services he will receive along with PT/OT. Directions were provided for thickening liquids and all questions were answered.  Pt may benefit from an OP MBS study in two-three weeks.      HPI HPI: [name] is an 85 y.o. male past medical history of diabetes mellitus type 2, essential hypertension coronary artery disease history of stroke dementia presents to the ED with several days of cough and shortness of breath.  80% on room air was brought into the ED placed on 5 L of oxygen his saturations improved, chest x-ray showed bilateral opacities, SARS-CoV-2 PCR was positive      SLP Plan  Continue with current plan of care       Recommendations  Diet recommendations: Dysphagia 2 (fine chop);Nectar-thick liquid Liquids provided via: Cup Medication Administration: Crushed with puree Supervision: Staff to assist with self feeding Compensations: Slow rate;Small sips/bites                Oral Care Recommendations: Oral care BID Follow up Recommendations: 24 hour supervision/assistance SLP Visit Diagnosis: Dysphagia, oropharyngeal phase (R13.12) Plan: Continue with current plan of care       GO                Juan Quam Laurice 07/09/2019, 5:25 PM  Kaiea Esselman L. Tivis Ringer, [place] Office number [phone] Pager [phone]

## 2019-07-09 NOTE — Discharge Instructions (Signed)
COVID-19 COVID-19 is a respiratory infection that is caused by a virus called severe acute respiratory syndrome coronavirus 2 (SARS-CoV-2). The disease is also known as coronavirus disease or novel coronavirus. In some people, the virus may not cause any symptoms. In others, it may cause a serious infection. The infection can get worse quickly and can lead to complications, such as:  Pneumonia, or infection of the lungs.  Acute respiratory distress syndrome or ARDS. This is fluid build-up in the lungs.  Acute respiratory failure. This is a condition in which there is not enough oxygen passing from the lungs to the body.  Sepsis or septic shock. This is a serious bodily reaction to an infection.  Blood clotting problems.  Secondary infections due to bacteria or fungus. The virus that causes COVID-19 is contagious. This means that it can spread from person to person through droplets from coughs and sneezes (respiratory secretions). What are the causes? This illness is caused by a virus. You may catch the virus by:  Breathing in droplets from an infected person's cough or sneeze.  Touching something, like a table or a doorknob, that was exposed to the virus (contaminated) and then touching your mouth, nose, or eyes. What increases the risk? Risk for infection You are more likely to be infected with this virus if you:  Live in or travel to an area with a COVID-19 outbreak.  Come in contact with a sick person who recently traveled to an area with a COVID-19 outbreak.  Provide care for or live with a person who is infected with COVID-19. Risk for serious illness You are more likely to become seriously ill from the virus if you:  Are 65 years of age or older.  Have a long-term disease that lowers your body's ability to fight infection (immunocompromised).  Live in a nursing home or long-term care facility.  Have a long-term (chronic) disease such as: ? Chronic lung disease, including  chronic obstructive pulmonary disease or asthma ? Heart disease. ? Diabetes. ? Chronic kidney disease. ? Liver disease.  Are obese. What are the signs or symptoms? Symptoms of this condition can range from mild to severe. Symptoms may appear any time from 2 to 14 days after being exposed to the virus. They include:  A fever.  A cough.  Difficulty breathing.  Chills.  Muscle pains.  A sore throat.  Loss of taste or smell. Some people may also have stomach problems, such as nausea, vomiting, or diarrhea. Other people may not have any symptoms of COVID-19. How is this diagnosed? This condition may be diagnosed based on:  Your signs and symptoms, especially if: ? You live in an area with a COVID-19 outbreak. ? You recently traveled to or from an area where the virus is common. ? You provide care for or live with a person who was diagnosed with COVID-19.  A physical exam.  Lab tests, which may include: ? A nasal swab to take a sample of fluid from your nose. ? A throat swab to take a sample of fluid from your throat. ? A sample of mucus from your lungs (sputum). ? Blood tests.  Imaging tests, which may include, X-rays, CT scan, or ultrasound. How is this treated? At present, there is no medicine to treat COVID-19. Medicines that treat other diseases are being used on a trial basis to see if they are effective against COVID-19. Your health care provider will talk with you about ways to treat your symptoms. For most   people, the infection is mild and can be managed at home with rest, fluids, and over-the-counter medicines. Treatment for a serious infection usually takes places in a hospital intensive care unit (ICU). It may include one or more of the following treatments. These treatments are given until your symptoms improve.  Receiving fluids and medicines through an IV.  Supplemental oxygen. Extra oxygen is given through a tube in the nose, a face mask, or a  hood.  Positioning you to lie on your stomach (prone position). This makes it easier for oxygen to get into the lungs.  Continuous positive airway pressure (CPAP) or bi-level positive airway pressure (BPAP) machine. This treatment uses mild air pressure to keep the airways open. A tube that is connected to a motor delivers oxygen to the body.  Ventilator. This treatment moves air into and out of the lungs by using a tube that is placed in your windpipe.  Tracheostomy. This is a procedure to create a hole in the neck so that a breathing tube can be inserted.  Extracorporeal membrane oxygenation (ECMO). This procedure gives the lungs a chance to recover by taking over the functions of the heart and lungs. It supplies oxygen to the body and removes carbon dioxide. Follow these instructions at home: Lifestyle  If you are sick, stay home except to get medical care. Your health care provider will tell you how long to stay home. Call your health care provider before you go for medical care.  Rest at home as told by your health care provider.  Do not use any products that contain nicotine or tobacco, such as cigarettes, e-cigarettes, and chewing tobacco. If you need help quitting, ask your health care provider.  Return to your normal activities as told by your health care provider. Ask your health care provider what activities are safe for you. General instructions  Take over-the-counter and prescription medicines only as told by your health care provider.  Drink enough fluid to keep your urine pale yellow.  Keep all follow-up visits as told by your health care provider. This is important. How is this prevented?  There is no vaccine to help prevent COVID-19 infection. However, there are steps you can take to protect yourself and others from this virus. To protect yourself:   Do not travel to areas where COVID-19 is a risk. The areas where COVID-19 is reported change often. To identify  high-risk areas and travel restrictions, check the CDC travel website: wwwnc.cdc.gov/travel/notices  If you live in, or must travel to, an area where COVID-19 is a risk, take precautions to avoid infection. ? Stay away from people who are sick. ? Wash your hands often with soap and water for 20 seconds. If soap and water are not available, use an alcohol-based hand sanitizer. ? Avoid touching your mouth, face, eyes, or nose. ? Avoid going out in public, follow guidance from your state and local health authorities. ? If you must go out in public, wear a cloth face covering or face mask. ? Disinfect objects and surfaces that are frequently touched every day. This may include:  Counters and tables.  Doorknobs and light switches.  Sinks and faucets.  Electronics, such as phones, remote controls, keyboards, computers, and tablets. To protect others: If you have symptoms of COVID-19, take steps to prevent the virus from spreading to others.  If you think you have a COVID-19 infection, contact your health care provider right away. Tell your health care team that you think you may   have a COVID-19 infection.  Stay home. Leave your house only to seek medical care. Do not use public transport.  Do not travel while you are sick.  Wash your hands often with soap and water for 20 seconds. If soap and water are not available, use alcohol-based hand sanitizer.  Stay away from other members of your household. Let healthy household members care for children and pets, if possible. If you have to care for children or pets, wash your hands often and wear a mask. If possible, stay in your own room, separate from others. Use a different bathroom.  Make sure that all people in your household wash their hands well and often.  Cough or sneeze into a tissue or your sleeve or elbow. Do not cough or sneeze into your hand or into the air.  Wear a cloth face covering or face mask. Where to find more  information  Centers for Disease Control and Prevention: www.cdc.gov/coronavirus/2019-ncov/index.html  World Health Organization: www.who.int/health-topics/coronavirus Contact a health care provider if:  You live in or have traveled to an area where COVID-19 is a risk and you have symptoms of the infection.  You have had contact with someone who has COVID-19 and you have symptoms of the infection. Get help right away if:  You have trouble breathing.  You have pain or pressure in your chest.  You have confusion.  You have bluish lips and fingernails.  You have difficulty waking from sleep.  You have symptoms that get worse. These symptoms may represent a serious problem that is an emergency. Do not wait to see if the symptoms will go away. Get medical help right away. Call your local emergency services (911 in the [place] Do not drive yourself to the hospital. Let the emergency medical personnel know if you think you have COVID-19. Summary  COVID-19 is a respiratory infection that is caused by a virus. It is also known as coronavirus disease or novel coronavirus. It can cause serious infections, such as pneumonia, acute respiratory distress syndrome, acute respiratory failure, or sepsis.  The virus that causes COVID-19 is contagious. This means that it can spread from person to person through droplets from coughs and sneezes.  You are more likely to develop a serious illness if you are 65 years of age or older, have a weak immunity, live in a nursing home, or have chronic disease.  There is no medicine to treat COVID-19. Your health care provider will talk with you about ways to treat your symptoms.  Take steps to protect yourself and others from infection. Wash your hands often and disinfect objects and surfaces that are frequently touched every day. Stay away from people who are sick and wear a mask if you are sick. This information is not intended to replace advice given to you by  your health care provider. Make sure you discuss any questions you have with your health care provider. Document Released: 08/24/2018 Document Revised: 12/14/2018 Document Reviewed: 08/24/2018 Elsevier Patient Education  2020 Elsevier Inc.   Information on my medicine - ELIQUIS (apixaban)  This medication education was reviewed with me or my healthcare representative as part of my discharge preparation.  The pharmacist that spoke with me during my hospital stay was:  Bessie Boyte, RPH-CPP  Why was Eliquis prescribed for you? Eliquis was prescribed for you to reduce the risk of a blood clot forming that can cause a stroke if you have a medical condition called atrial fibrillation (a type of irregular heartbeat).    What do You need to know about Eliquis ? Take your Eliquis TWICE DAILY - one tablet in the morning and one tablet in the evening with or without food. If you have difficulty swallowing the tablet whole please discuss with your pharmacist how to take the medication safely.  Take Eliquis exactly as prescribed by your doctor and DO NOT stop taking Eliquis without talking to the doctor who prescribed the medication.  Stopping may increase your risk of developing a stroke.  Refill your prescription before you run out.  After discharge, you should have regular check-up appointments with your healthcare provider that is prescribing your Eliquis.  In the future your dose may need to be changed if your kidney function or weight changes by a significant amount or as you get older.  What do you do if you miss a dose? If you miss a dose, take it as soon as you remember on the same day and resume taking twice daily.  Do not take more than one dose of ELIQUIS at the same time to make up a missed dose.  Important Safety Information A possible side effect of Eliquis is bleeding. You should call your healthcare provider right away if you experience any of the following: ? Bleeding from an  injury or your nose that does not stop. ? Unusual colored urine (red or dark brown) or unusual colored stools (red or black). ? Unusual bruising for unknown reasons. ? A serious fall or if you hit your head (even if there is no bleeding).  Some medicines may interact with Eliquis and might increase your risk of bleeding or clotting while on Eliquis. To help avoid this, consult your healthcare provider or pharmacist prior to using any new prescription or non-prescription medications, including herbals, vitamins, non-steroidal anti-inflammatory drugs (NSAIDs) and supplements.  This website has more information on Eliquis (apixaban): http://www.eliquis.com/eliquis/home  

## 2019-07-09 NOTE — TOC Progression Note (Signed)
Transition of Care [facility]) - Progression Note    Patient Details  Name: [name] MRN: [id number] Date of Birth: 1922/01/05  [facility]) CM/SW Contact  Joaquin Courts, RN Phone Number: 07/09/2019, 2:54 PM  Clinical Narrative:    CM spoke extensively with daughter, who reports she does not want her dad to go to any of the facilities, states she feels they will have a lot of sick people in them with Covid and she does not want her dad to go there. Reports she will take her dad home and care for him at home to the best of her ability. Daughter reports patient has rolling walker, wheelchair, and 3-in-1 at home. [facility] for HH[facility] and aide. [employer] to deliver hospital bed and hoyer lift to home. Once equipment delivery is confirmed patient will dc via PTAR.      Expected Discharge Plan: [place] Barriers to Discharge: Equipment Delay  Expected Discharge Plan and Services Expected Discharge Plan: [place] In-house Referral: Clinical Social Work Discharge Planning Services: CM Consult Post Acute Care Choice: [place] arrangements for the past 2 months: Single Family Home Expected Discharge Date: 07/09/19               DME Arranged: Other see comment, Hospital bed(hoyer lift) DME Agency: [employer] Date DME Agency Contacted: 07/09/19 Time DME Agency Contacted: [id number] Representative spoke with at DME Agency: [place]: Nurse's Aide, [place], PT [place] Agency: [facility] ([facility] [employer] Date [place]: 07/09/19 Time [place]: 1454 Representative spoke with at [place]: [place] ([place]) Interventions    Readmission Risk Interventions No flowsheet data found.

## 2019-07-09 NOTE — Progress Notes (Addendum)
Physical Therapy Treatment Patient Details Name: [name] MRN: [id number] DOB: 15-Jun-1922 Today's Date: 07/09/2019    History of Present Illness [name] is an 85 y.o. male past medical history of diabetes mellitus type 2, essential hypertension coronary artery disease history of stroke dementia presents to the ED with several days of cough and shortness of breath.  80% on room air was brought into the ED placed on 5 L of oxygen his saturations improved, chest x-ray showed bilateral opacities, SARS-CoV-2 PCR was positive.    PT Comments    Pt was able to sit EOB with less posterior lean today and stand with RW several times for pericare. He fatigues quickly and sits without warning making transfer to the chair difficult without a standing frame or hoyer lift to prevent buckling.  He is also at risk for aspiration if he is not fully upright to eat, so would benefit from a hospital bed at home for upright positioning.  PT will continue to follow acutely for safe mobility progression  Follow Up Recommendations  SNF;Supervision/Assistance - 24 hour;Other (comment)(family would like to take him home.  )     [facility] bed;Other (comment)(hoyer lift)    Recommendations for Other Services   NA     Precautions / Restrictions Precautions Precautions: Fall;Other (comment) Precaution Comments: dementia with behavioral disturbances.    Mobility  Bed Mobility Overal bed mobility: Needs Assistance Bed Mobility: Supine to Sit     Supine to sit: Mod assist;HOB elevated Sit to supine: Mod assist;HOB elevated   General bed mobility comments: Mod assist to support trunk to come to sitting EOB, pt responded well to verbal and visual cues waving him up.    Transfers Overall transfer level: Needs assistance Equipment used: Rolling walker (2 wheeled) Transfers: Sit to/from Stand Sit to Stand: Mod assist;From elevated surface;+2 physical assistance          General transfer comment: Two person mod assist to stand from bed x 4 for practice and then for peri care.  Pt with less posterior preference today, but it does come out the more he fatigues.  Slowly sinking down to sitting EOB.   Ambulation/Gait             General Gait Details: unable at this time.  Per RN and OT report, he will sit/buckle unexpectedly.        Balance Overall balance assessment: Needs assistance Sitting-balance support: Feet supported;No upper extremity supported Sitting balance-Leahy Scale: Poor Sitting balance - Comments: initially supervision, as he fatigued (seated EOB working on upright posture, sitting tolerance and taking the opportunity to feed him breakfast) he would start to lean posteriorly requiring min assist at trunk to prevent posterior LOB.  Postural control: Posterior lean Standing balance support: Bilateral upper extremity supported Standing balance-Leahy Scale: Poor Standing balance comment: needs mod assist in standing, can stand for 45 sec-1 min for peri care multiple times.                             Cognition Arousal/Alertness: Awake/alert Behavior During Therapy: Flat affect Overall Cognitive Status: History of cognitive impairments - at baseline                                        Exercises Other Exercises Other Exercises: Pt enjoyed Christmas music during session, humming  along, we ended up doing modified UE ROM by "dancing" seated to the music.          Pertinent Vitals/Pain Pain Assessment: Faces Pain Score: 0-No pain           PT Goals (current goals can now be found in the care plan section) Acute Rehab PT Goals Patient Stated Goal: Unable to state Progress towards PT goals: Progressing toward goals    Frequency    Min 3X/week      PT Plan Current plan remains appropriate       AM-PAC PT "6 Clicks" Mobility   Outcome Measure  Help needed turning from your back to your  side while in a flat bed without using bedrails?: A Lot Help needed moving from lying on your back to sitting on the side of a flat bed without using bedrails?: A Lot Help needed moving to and from a bed to a chair (including a wheelchair)?: Total Help needed standing up from a chair using your arms (e.g., wheelchair or bedside chair)?: A Lot Help needed to walk in hospital room?: Total Help needed climbing 3-5 steps with a railing? : Total 6 Click Score: 9    End of Session Equipment Utilized During Treatment: Gait belt Activity Tolerance: Patient tolerated treatment well Patient left: in bed;with call bell/phone within reach;with bed alarm set Nurse Communication: Mobility status PT Visit Diagnosis: Muscle weakness (generalized) (M62.81);Difficulty in walking, not elsewhere classified (R26.2)     Time:  [id number]    Charges:  $Therapeutic Activity: 38-52 mins                    Verdene Lennert, PT, DPT  Acute Rehabilitation [phone] pager #[phone] office  @ [place]: [phone]   07/09/2019, 5:49 PM

## 2019-07-09 NOTE — Progress Notes (Signed)
07/09/2019 Patient suffers from [place] 19 which impairs their ability to perform daily activities like eating safely, mobilizing OOB in the home.  A regular bed and RW alone will not resolve the issues with performing activities of daily living. A hoyer lift and hospital bed will allow patient to safely eat with HOB elevated and mobilize OOB without risk of falling.  The family will be able to operate said equipment as it will also decrease caregiver burden.  Thanks,  Verdene Lennert, PT, DPT  Acute Rehabilitation [phone] pager [phone] office  @ [place]: [phone]

## 2019-07-10 ENCOUNTER — Other Ambulatory Visit: Payer: Self-pay

## 2019-07-10 LAB — GLUCOSE, CAPILLARY
Glucose-Capillary: 145 mg/dL — ABNORMAL HIGH (ref 70–99)
Glucose-Capillary: 210 mg/dL — ABNORMAL HIGH (ref 70–99)
Glucose-Capillary: 130 mg/dL — ABNORMAL HIGH (ref 70–99)
Glucose-Capillary: 143 mg/dL — ABNORMAL HIGH (ref 70–99)

## 2019-07-10 LAB — CULTURE, BLOOD (ROUTINE X 2)
Culture: NO GROWTH
Culture: NO GROWTH

## 2019-07-10 NOTE — Consult Note (Addendum)
   [facility] CM Inpatient Consult   07/10/2019  Weyman Mauzey 12-29-1921 [id number]    Received referral from [place] on 07/06/19 for General EMMI (Red) follow-up, while patient was still in the ED.  Patient is recently active with [facility]. Patienthas been recently engaged by [place] coordinator prior to admission, in the [place] with [facility]. Our community based plan of care has focused on disease management and community resource support.  This patient will receive a post hospital call for follow-up once discharge to home.  Patient waschecked for high risk score for unplanned readmission(28%) andhospitalizations as a benefit from [place].  Chart reviewed andMD note shows that patient was admitted for Acute respiratory failure with hypoxia due to Pneumonia, due to COVID-19 virus.  His primary care provider isTiffany Reed, DO with [facility].  Review of PT/ OT notes recommend discharge to skilled nursing facility,(but his daughter refused to send him to skilled nursing facility and wants patient to go home but she is unable to receive the equipment until 07/10/2019).    Transition of care CM note states that [facility] for HH[facility] and aide. [employer] to deliver hospital bed and hoyer lift to home.  Spoke with inpatient TOC CM regarding disposition and needs, made her aware that [facility] CM following and notified of [facility] RN CM discharge concerns. Willnotify [facility] RN CMof discharge disposition for appropriate follow-up.   For additional questions or referrals,please contact:  Clover Feehan A. Willetta [place], BSN, RN-BC [facility] Liaison Cell: [phone]

## 2019-07-10 NOTE — TOC Progression Note (Signed)
Transition of Care [facility]) - Progression Note    Patient Details  Name: Demaryius Meininger MRN: [id number] Date of Birth: [birth year]  [facility]) CM/SW Contact  Joaquin Courts, RN Phone Number: 07/10/2019, 10:00 AM  Clinical Narrative:    CM spoke with patient's daughter regarding equipment delivery. Daughter reports the home is ready and she will get off work at 62. Daughter works from home and states she can let delivery driver into house after 430pm. CM spoke with [employer] rep Zack and requested that delivery be scheduled at 28 pm. [employer] to confirm delivery time with daughter and notify CM if any issues arise. Patient will be able to dc home by PTAR once equipment is delivered.     Expected Discharge Plan: [place] Barriers to Discharge: Equipment Delay  Expected Discharge Plan and Services Expected Discharge Plan: [place] In-house Referral: Clinical Social Work Discharge Planning Services: CM Consult Post Acute Care Choice: [place] arrangements for the past 2 months: Single Family Home Expected Discharge Date: 07/09/19               DME Arranged: Other see comment, Hospital bed(hoyer lift) DME Agency: [employer] Date DME Agency Contacted: 07/09/19 Time DME Agency Contacted: [id number] Representative spoke with at DME Agency: [place]: Nurse's Aide, [place], PT [place] Agency: [facility] [employer] Date [place]: 07/09/19 Time [place]: 1454 Representative spoke with at [place]: [place] ([place]) Interventions    Readmission Risk Interventions No flowsheet data found.

## 2019-07-10 NOTE — TOC Progression Note (Signed)
Transition of Care [facility]) - Progression Note    Patient Details  Name: [name] MRN: [id number] Date of Birth: 1922-05-18  Transition of Care [facility]) CM/SW Contact  Joaquin Courts, RN Phone Number: 07/10/2019, 11:30 AM  Clinical Narrative:  Received call from [facility] team lead who reports she has spoken with daughter and daughter is agreeable to accept equipment delivery earlier than 430 pm. [employer] notified of this fact and new delivery timeframe is between 1-4 pm.      Expected Discharge Plan: [place] Barriers to Discharge: Equipment Delay  Expected Discharge Plan and Services Expected Discharge Plan: [place] In-house Referral: Clinical Social Work Discharge Planning Services: CM Consult Post Acute Care Choice: [place] arrangements for the past 2 months: Single Family Home Expected Discharge Date: 07/09/19               DME Arranged: Other see comment, Hospital bed(hoyer lift) DME Agency: [employer] Date DME Agency Contacted: 07/09/19 Time DME Agency Contacted: [id number] Representative spoke with at DME Agency: [place]: Nurse's Aide, [place], PT [place] Agency: [facility] [employer] Date [place]: 07/09/19 Time [place]: 1454 Representative spoke with at [place]: [place] ([place]) Interventions    Readmission Risk Interventions No flowsheet data found.

## 2019-07-10 NOTE — Care Management Important Message (Signed)
Important Message  Patient Details  Name: [name] MRN: [id number] Date of Birth: [birth year]   Medicare Important Message Given:  Yes - Important Message mailed due to current National Emergency  Verbal consent obtained due to current National Emergency  Relationship to patient: Child Contact Name: MaryMcGowan Call Date: 07/10/19  Time: 1513 Phone: [phone] Outcome: Spoke with contact Important Message mailed to: Patient address on file    Delorse Lek 07/10/2019, 3:13 PM

## 2019-07-10 NOTE — Progress Notes (Signed)
[facility] PROGRESS NOTE    Progress Note  [name]  [id number] DOB: Dec 01, 1921 DOA: 07/05/2019 PCP: Gayland Curry, DO     Brief Narrative:   [name] is an 85 y.o. male past medical history of diabetes mellitus type 2, essential hypertension coronary artery disease history of stroke dementia presents to the ED with several days of cough and shortness of breath.  80% on room air was brought into the ED placed on 5 L of oxygen his saturations improved, chest x-ray showed bilateral opacities, SARS-CoV-2 PCR was positive  Assessment/Plan:   Acute respiratory failure with hypoxia due to Pneumonia due to COVID-19 virus He is now on room air satting greater than 94%. He has completed his course of IV remdesivir and steroids. Physical therapy evaluated the patient and recommended skilled nursing facility but the daughter refused to send him to skilled nursing facility.  Now the daughter is demanding that he goes home but she can not receive the equipment to 07/10/2019 so he remain an extra day in the hospital. Is refused for the patient to go to skilled nursing facility. Benefits of doing this have been explained to the daughter and she relates she understands. No changes overnight.  Acute kidney injury: Prerenal azotemia resolved with IV fluid hydration.  Type 2 diabetes, controlled, with peripheral circulatory disorder (HCC) Fairly controlled no changes made to his medication.  History of pulmonary embolism: Continue Eliquis.  Essential hypertension: Blood pressure is fairly controlled continue to hold hydrochlorothiazide continue all other medications.  Dementia: Patient with advanced dementia significantly deconditioned. Physical therapy evaluated the patient recommended skilled nursing facility, the daughter refused now she wants him home but she could not receive the equipment till today.    DVT prophylaxis: lovenox Family Communication:none Disposition  Plan/Barrier to D/C: unable to determine Code Status:     Code Status Orders  (From admission, onward)         Start     Ordered   07/05/19 2010  Full code  Continuous     07/05/19 2010        Code Status History    Date Active Date Inactive Code Status Order ID Comments User Context   07/05/2019 1824 07/05/2019 2010 Full Code [id number]  Margarita Mail, PA-C ED   07/05/2019 1821 07/05/2019 1824 Full Code [id number]  Margarita Mail, PA-C ED   06/01/2019 2257 06/05/2019 2147 Full Code [id number]  Vianne Bulls, MD ED   04/17/2014 2008 04/19/2014 1418 Full Code [id number]  Shanda Howells, MD ED   Advance Care Planning Activity    Advance Directive Documentation     Most Recent Value  Type of Advance Directive  Healthcare Power of Attorney  Pre-existing out of facility DNR order (yellow form or pink MOST form)  -  "MOST" Form in Place?  -        IV Access:    Peripheral IV   Procedures and diagnostic studies:   No results found.   Medical Consultants:    None.  Anti-Infectives:  IV remdesivir Subjective:    [name] nonverbal.  Objective:    Vitals:   07/09/19 2250 07/09/19 2300 07/09/19 2350 07/10/19 0000  BP:   134/74   Pulse: 66 67 69   Resp:      Temp:    97.8 F (36.6 C)  TempSrc:    Oral  SpO2: 96% 97% 96%   Weight:      Height:  SpO2: 96 % O2 Flow Rate (L/min): 2 L/min   Intake/Output Summary (Last 24 hours) at 07/10/2019 0714 Last data filed at 07/10/2019 0500 Gross per 24 hour  Intake 840 ml  Output 1250 ml  Net -410 ml   Filed Weights   07/06/19 1640  Weight: 76.3 kg    Exam: General exam: In no acute distress. Respiratory system: Good air movement and clear to auscultation. Cardiovascular system: S1 & S2 heard, RRR. No JVD. Gastrointestinal system: Abdomen is nondistended, soft and nontender.  Central nervous system: Alert and oriented. No focal neurological deficits.  Pleasantly confused with a smile on his  face. Extremities: No pedal edema. Skin: No rashes, lesions or ulcers   Data Reviewed:    Labs: Basic Metabolic Panel: Recent Labs  Lab 07/05/19 1536 07/06/19 0204 07/07/19 0033 07/09/19 0335  NA 142 143 144 143  K 4.3 4.9 4.6 4.7  CL 104 105 105 103  CO2 29 30 29 30   GLUCOSE 242* 193* 226* 91  BUN 30* 29* 36* 35*  CREATININE 1.48* 1.29* 1.12 0.98  CALCIUM 8.9 8.9 8.9 9.3  MG  --   --  2.1  --    GFR Estimated Creatinine Clearance: 48.7 mL/min (by C-G formula based on SCr of 0.98 mg/dL). Liver Function Tests: Recent Labs  Lab 07/05/19 1536 07/06/19 0204 07/07/19 0033 07/09/19 0335  AST 32 35 47* 94*  ALT 31 30 38 107*  ALKPHOS 68 69 59 62  BILITOT 0.3 0.7 0.2* 0.3  PROT 6.3* 6.5 6.2* 6.5  ALBUMIN 3.1* 3.2* 2.9* 3.3*   No results for input(s): LIPASE, AMYLASE in the last 168 hours. No results for input(s): AMMONIA in the last 168 hours. Coagulation profile No results for input(s): INR, PROTIME in the last 168 hours. COVID-19 Labs  No results for input(s): DDIMER, FERRITIN, LDH, CRP in the last 72 hours.  Lab Results  Component Value Date   SARSCOV2NAA NEGATIVE 06/12/2019   [place] NEGATIVE 06/01/2019    CBC: Recent Labs  Lab 07/05/19 1536 07/06/19 0204 07/07/19 0033  WBC 3.8* 4.0 2.6*  NEUTROABS 2.2 2.9 1.9  HGB 9.7* 10.5* 9.0*  HCT 31.4* 34.1* 29.1*  MCV 96.3 96.6 95.4  PLT 170 162 179   Cardiac Enzymes: No results for input(s): CKTOTAL, CKMB, CKMBINDEX, TROPONINI in the last 168 hours. BNP (last 3 results) No results for input(s): PROBNP in the last 8760 hours. CBG: Recent Labs  Lab 07/08/19 2130 07/09/19 0730 07/09/19 1145 07/09/19 1641 07/09/19 1944  GLUCAP 216* 112* 225* 148* 156*   D-Dimer: No results for input(s): DDIMER in the last 72 hours. Hgb A1c: No results for input(s): HGBA1C in the last 72 hours. Lipid Profile: No results for input(s): CHOL, HDL, LDLCALC, TRIG, CHOLHDL, LDLDIRECT in the last 72 hours. Thyroid  function studies: No results for input(s): TSH, T4TOTAL, T3FREE, THYROIDAB in the last 72 hours.  Invalid input(s): FREET3 Anemia work up: No results for input(s): VITAMINB12, FOLATE, FERRITIN, TIBC, IRON, RETICCTPCT in the last 72 hours. Sepsis Labs: Recent Labs  Lab 07/05/19 1536 07/06/19 0147 07/06/19 0204 07/07/19 0033  PROCALCITON <0.10  --   --   --   WBC 3.8*  --  4.0 2.6*  LATICACIDVEN 1.9 1.4  --   --    Microbiology Recent Results (from the past 240 hour(s))  Blood Culture (routine x 2)     Status: None (Preliminary result)   Collection Time: 07/05/19  4:03 PM   Specimen: BLOOD RIGHT HAND  Result Value Ref Range Status   Specimen Description BLOOD RIGHT HAND  Final   Special Requests   Final    BOTTLES DRAWN AEROBIC AND ANAEROBIC Blood Culture results may not be optimal due to an inadequate volume of blood received in culture bottles   Culture   Final    NO GROWTH 4 DAYS Performed at [facility] [address] [place], [place] [address]    Report Status PENDING  Incomplete  Blood Culture (routine x 2)     Status: None (Preliminary result)   Collection Time: 07/05/19  4:05 PM   Specimen: BLOOD  Result Value Ref Range Status   Specimen Description BLOOD LEFT ANTECUBITAL  Final   Special Requests   Final    BOTTLES DRAWN AEROBIC AND ANAEROBIC Blood Culture results may not be optimal due to an inadequate volume of blood received in culture bottles   Culture   Final    NO GROWTH 4 DAYS Performed at [facility], [place] [address], [place], [place] [address]    Report Status PENDING  Incomplete  MRSA PCR Screening     Status: None   Collection Time: 07/06/19  5:08 PM   Specimen: Nasal Mucosa; Nasopharyngeal  Result Value Ref Range Status   MRSA by PCR NEGATIVE NEGATIVE Final    Comment:        The GeneXpert MRSA Assay (FDA approved for NASAL specimens only), is one component of a comprehensive MRSA colonization surveillance program. It is  not intended to diagnose MRSA infection nor to guide or monitor treatment for MRSA infections. Performed at [facility], [place] [address] [place], [place] [address]      Medications:   . albuterol  2 puff Inhalation Q6H  . amLODipine  5 mg Oral Daily  . apixaban  5 mg Oral BID  . aspirin EC  81 mg Oral Daily  . dexamethasone (DECADRON) injection  6 mg Intravenous Q24H  . memantine  28 mg Oral QHS   And  . donepezil  10 mg Oral QHS  . escitalopram  5 mg Oral Daily  . feeding supplement (ENSURE ENLIVE)  237 mL Oral BID BM  . gabapentin  100 mg Oral QHS  . insulin aspart  0-5 Units Subcutaneous QHS  . insulin aspart  0-9 Units Subcutaneous TID WC  . insulin glargine  10 Units Subcutaneous BID  . loratadine  10 mg Oral Daily  . mouth rinse  15 mL Mouth Rinse BID  . multivitamin with minerals  1 tablet Oral Daily  . tamsulosin  0.4 mg Oral QPC supper   Continuous Infusions: . sodium chloride 10 mL/hr at 07/07/19 1017      LOS: 5 days   Charlynne Cousins  [facility]  07/10/2019, 7:14 AM

## 2019-07-10 NOTE — Patient Outreach (Addendum)
[facility]) Care Management  07/10/2019  [name] [birth year] [id number]   Telephone call to Jenny Reichmann, Nurse Manager at [place] at [facility].  Discussed patient discharge status to home.  She states she has that to review today.  Advised her that the orders are for PT, OT and an aide.  Advised her that daughter wants to take him home instead of SNF placement.  Advised her that she mentioned last week if patient was COVID positive [facility] would not send an aide.   She states that her therapist felt the daughter could not care for patient and that she herself would have to notify the daughter that an aide would not be provided with a COVID positive status.  She is appreciative of the notification and will follow up on home health referral.    Jone Baseman, RN, MSN [place] Management Care Management Coordinator Direct Line [phone] Cell [phone] Toll Free: [phone]  Fax: [phone]

## 2019-07-10 NOTE — Progress Notes (Signed)
Received message from Attending MD, DME needs to be delivered to the home as early as possible; pt ready for dc home today; TCT daughter [name], she is in agreement for the DME to be delivered to the home at an earlier time. Louanna Raw RN CM updated- she will contact Zack with [employer] for an earlier time for delivery. [place] Supervisor [phone]

## 2019-07-10 NOTE — TOC Progression Note (Signed)
Transition of Care [facility]) - Progression Note    Patient Details  Name: [name] MRN: [id number] Date of Birth: 1922-04-30  Transition of Care [facility]) CM/SW Contact  Joaquin Courts, RN Phone Number: 07/10/2019, 2:15 PM  Clinical Narrative:    CM received call from [facility] representative stating that the [facility] CM was informed by [facility] branch manager that an aide would not be provided to patient due to Covid+ status. CM reached out to [employer] for confirmation.  Rep reports he spoke with branch manager and while there may be some delay in care, the aide would be provided as it is ordered by the physician.  [facility] rep states a PT will go out to patient's home to complete the admission and create treatment plan. CM was referred to [facility] team scheduler Tamsen Meek for additional information. CM spoke with Tamsen Meek who reports she has spoken with patient's daughter this morning and has discussed the plan for [facility] services. Per Jenny Reichmann patient has been active with [facility] in the past and they are familiar with patient and his daughter. Per Jenny Reichmann in the past they have questioned about the appropriateness of patient remaining at home as it was felt he would be better served in a SNF, additionally, hospice services have also been discussed.  Jenny Reichmann reports that the patient's daughter remained adamant that she wishes to bring her dad home during their conversation today.  This CM had a conversation with daughter this am and she stated she will do what she has to to care for her dad at home.  [facility] is prepared to admit patient for [facility] services once he is discharged from the hospital. Jenny Reichmann did share that if the patient is not deemed to be in a safe home care environment during admission then the agency will follow the proper steps to either have patient sent to ED or notify APS if daughter remains adamant on keeping him at home. Jenny Reichmann also states that [facility] aide services take place as a visit, the aide is  not in the home for prolonged periods of time, delay in start of aide care is due to need for quarantine period and is done for the safety of the staff and community at large.  The concerns raised by [facility] regarding delay in aide services were discussed with [facility] team lead Olga Coaster.  Will proceed with planned dc to patient's daughters home with [facility] services and hospital bed and hoyer lift.   Expected Discharge Plan: [place] Barriers to Discharge: Equipment Delay  Expected Discharge Plan and Services Expected Discharge Plan: [place] In-house Referral: Clinical Social Work Discharge Planning Services: CM Consult Post Acute Care Choice: [place] arrangements for the past 2 months: Single Family Home Expected Discharge Date: 07/09/19               DME Arranged: Other see comment, Hospital bed(hoyer lift) DME Agency: [employer] Date DME Agency Contacted: 07/09/19 Time DME Agency Contacted: [id number] Representative spoke with at DME Agency: [place]: Nurse's Aide, [place], PT [place] Agency: [facility] [employer] Date [place]: 07/09/19 Time [place]: 1454 Representative spoke with at [place]: [place] ([place]) Interventions    Readmission Risk Interventions No flowsheet data found.

## 2019-07-10 NOTE — TOC Progression Note (Signed)
Transition of Care [facility]) - Progression Note    Patient Details  Name: [name] MRN: [id number] Date of Birth: 07-21-1922  Transition of Care [facility]) CM/SW Contact  Joaquin Courts, RN Phone Number: 07/10/2019, 5:17 PM  Clinical Narrative:    CM received notification that patient's daughter Zelmer Meuse initiated a discharge appeal. CM reached out to [facility] and went over the detailed notice of discharge as well as the HINN-12, this was done over the telephone due to the ongoing Covid-19 pandemic.  [name] became distraught and expressed that she does not want to be financially responsible and does not have any money to pay for her dads stay and stated that she wished for her dad to just come home today.  CM explained that financial responsibility does not take place until a decision is made on the appeal.  The provided date on the HINN-12 is an estimation of when financial responsibility could start, but we would need to submit all requested documents first and wait for the decision on the appeal case.  [name] stated she wants her dad to come home now, CM communicated that this could be done but [name] would need to cancel the appeal.  [name] called CM back after calling the provided Judithann Graves number and stated that after speaking with them she wishes to pursue the appeal after all.  CM went over the [facility] 12 and detailed notice of discharge with [name], who expressed that she understands the information that was read to her from the document.  MD was made aware that an appeal was initiated.  Will await request for clinicals from [facility].     Expected Discharge Plan: [place] Barriers to Discharge: Equipment Delay  Expected Discharge Plan and Services Expected Discharge Plan: [place] In-house Referral: Clinical Social Work Discharge Planning Services: CM Consult Post Acute Care Choice: [place] arrangements for the past 2 months: Single Family  Home Expected Discharge Date: 07/09/19               DME Arranged: Other see comment, Hospital bed(hoyer lift) DME Agency: [employer] Date DME Agency Contacted: 07/09/19 Time DME Agency Contacted: [id number] Representative spoke with at DME Agency: [place]: Nurse's Aide, [place], PT [place] Agency: [facility] [employer] Date [place]: 07/09/19 Time [place]: 1454 Representative spoke with at [place]: [place] ([place]) Interventions    Readmission Risk Interventions No flowsheet data found.

## 2019-07-11 LAB — GLUCOSE, CAPILLARY
Glucose-Capillary: 134 mg/dL — ABNORMAL HIGH (ref 70–99)
Glucose-Capillary: 168 mg/dL — ABNORMAL HIGH (ref 70–99)
Glucose-Capillary: 161 mg/dL — ABNORMAL HIGH (ref 70–99)
Glucose-Capillary: 268 mg/dL — ABNORMAL HIGH (ref 70–99)

## 2019-07-11 MED ORDER — DEXAMETHASONE 6 MG PO TABS: 6.0000 mg | ORAL_TABLET | Freq: Every day | ORAL | 0 refills | Status: DC

## 2019-07-11 NOTE — Progress Notes (Signed)
[facility] PROGRESS NOTE    Progress Note  [name]  [id number] DOB: 1922/02/22 DOA: 07/05/2019 PCP: Gayland Curry, DO     Brief Narrative:   [name] is an 85 y.o. male past medical history of diabetes mellitus type 2, essential hypertension, coronary artery disease, history of stroke, dementia presents to the ED with several days of cough and shortness of breath.  80% on room air was brought into the ED placed on 5 L of oxygen his saturations improved, chest x-ray showed bilateral opacities, SARS-CoV-2 PCR was positive.  Assessment/Plan:   Acute respiratory failure with hypoxia due to Pneumonia due to COVID-19 virus: He is now on room air satting greater than 94%. He has completed his course of IV remdesivir and steroids. Physical therapy evaluated the patient and recommended skilled nursing facility but the daughter refused to send him to skilled nursing facility. She refused for the patient to go to skilled nursing facility. Benefits of doing this have been explained to the daughter and she relates she understands. No changes overnight.  Acute kidney injury: Prerenal azotemia resolved with IV fluid hydration.  Type 2 diabetes, controlled, with peripheral circulatory disorder (HCC) Fairly controlled no changes made to his medication.  History of pulmonary embolism: Continue Eliquis.  Essential hypertension: Blood pressure is fairly controlled continue to hold hydrochlorothiazide continue all other medications.  Dementia: Patient with advanced dementia significantly deconditioned. Physical therapy evaluated the patient recommended skilled nursing facility, but the daughter wants to take him home.  Family communication: Patient's daughter Ms. [name] called and updated about patient's condition.  Patient is medically stable.  He is 89 with deconditioning, does not need active medical attention.  She is waiting to hear from the appeal, I suggested that  patient is medically stable to go home. Discharged home with patient's daughter, patient will need ambulance home when she is ready to receive him to provide him 24/7 care.  DVT prophylaxis: lovenox Family Communication: Daughter. Disposition Plan/Barrier to D/C: Daughter undecided.  Medically stable. Code Status:     Code Status Orders  (From admission, onward)         Start     Ordered   07/05/19 2010  Full code  Continuous     07/05/19 2010        Code Status History    Date Active Date Inactive Code Status Order ID Comments User Context   07/05/2019 1824 07/05/2019 2010 Full Code [id number]  Ned [facility] ED   07/05/2019 1821 07/05/2019 1824 Full Code [id number]  Margarita Mail, PA-C ED   06/01/2019 2257 06/05/2019 2147 Full Code [id number]  Vianne Bulls, MD ED   04/17/2014 2008 04/19/2014 1418 Full Code [id number]  Shanda Howells, MD ED   Advance Care Planning Activity    Advance Directive Documentation     Most Recent Value  Type of Advance Directive  Healthcare Power of Attorney  Pre-existing out of facility DNR order (yellow form or pink MOST form)  -  "MOST" Form in Place?  -        IV Access:    Peripheral IV   Procedures and diagnostic studies:   No results found.   Medical Consultants:    None.  Anti-Infectives:  IV remdesivir, finished therapy. Subjective:    No overnight events.  Afebrile.  Remains on room air.  He yelled his name but did not offer any other complaints.  Objective:    Vitals:   07/11/19 0000 07/11/19 0015 07/11/19  0030 07/11/19 1203  BP:   (!) 122/56 (!) 123/56  Pulse: 66 68 73 72  Resp:    (!) 23  Temp:   98 F (36.7 C) (!) 97.5 F (36.4 C)  TempSrc:   Oral Axillary  SpO2: 97% 97% 95% 96%  Weight:      Height:       SpO2: 96 % O2 Flow Rate (L/min): 2 L/min   Intake/Output Summary (Last 24 hours) at 07/11/2019 1259 Last data filed at 07/11/2019 0900 Gross per 24 hour  Intake 496 ml  Output 1500 ml  Net  -1004 ml   Filed Weights   07/06/19 1640  Weight: 76.3 kg    Exam: General exam: In no acute distress. Respiratory system: Good air movement and clear to auscultation. Cardiovascular system: S1 & S2 heard, RRR. No JVD. Gastrointestinal system: Abdomen is nondistended, soft and nontender.  Central nervous system: Alert but not oriented.  Pleasantly confused with a smile on his face. Extremities: No pedal edema. Skin: No rashes, lesions or ulcers   Data Reviewed:    Labs: Basic Metabolic Panel: Recent Labs  Lab 07/05/19 1536 07/06/19 0204 07/07/19 0033 07/09/19 0335  NA 142 143 144 143  K 4.3 4.9 4.6 4.7  CL 104 105 105 103  CO2 29 30 29 30   GLUCOSE 242* 193* 226* 91  BUN 30* 29* 36* 35*  CREATININE 1.48* 1.29* 1.12 0.98  CALCIUM 8.9 8.9 8.9 9.3  MG  --   --  2.1  --    GFR Estimated Creatinine Clearance: 48.7 mL/min (by C-G formula based on SCr of 0.98 mg/dL). Liver Function Tests: Recent Labs  Lab 07/05/19 1536 07/06/19 0204 07/07/19 0033 07/09/19 0335  AST 32 35 47* 94*  ALT 31 30 38 107*  ALKPHOS 68 69 59 62  BILITOT 0.3 0.7 0.2* 0.3  PROT 6.3* 6.5 6.2* 6.5  ALBUMIN 3.1* 3.2* 2.9* 3.3*   No results for input(s): LIPASE, AMYLASE in the last 168 hours. No results for input(s): AMMONIA in the last 168 hours. Coagulation profile No results for input(s): INR, PROTIME in the last 168 hours. COVID-19 Labs  No results for input(s): DDIMER, FERRITIN, LDH, CRP in the last 72 hours.  Lab Results  Component Value Date   SARSCOV2NAA NEGATIVE 06/12/2019   [place] NEGATIVE 06/01/2019    CBC: Recent Labs  Lab 07/05/19 1536 07/06/19 0204 07/07/19 0033  WBC 3.8* 4.0 2.6*  NEUTROABS 2.2 2.9 1.9  HGB 9.7* 10.5* 9.0*  HCT 31.4* 34.1* 29.1*  MCV 96.3 96.6 95.4  PLT 170 162 179   Cardiac Enzymes: No results for input(s): CKTOTAL, CKMB, CKMBINDEX, TROPONINI in the last 168 hours. BNP (last 3 results) No results for input(s): PROBNP in the last 8760  hours. CBG: Recent Labs  Lab 07/10/19 1211 07/10/19 1600 07/10/19 2025 07/11/19 0823 07/11/19 1201  GLUCAP 210* 130* 143* 134* 168*   D-Dimer: No results for input(s): DDIMER in the last 72 hours. Hgb A1c: No results for input(s): HGBA1C in the last 72 hours. Lipid Profile: No results for input(s): CHOL, HDL, LDLCALC, TRIG, CHOLHDL, LDLDIRECT in the last 72 hours. Thyroid function studies: No results for input(s): TSH, T4TOTAL, T3FREE, THYROIDAB in the last 72 hours.  Invalid input(s): FREET3 Anemia work up: No results for input(s): VITAMINB12, FOLATE, FERRITIN, TIBC, IRON, RETICCTPCT in the last 72 hours. Sepsis Labs: Recent Labs  Lab 07/05/19 1536 07/06/19 0147 07/06/19 0204 07/07/19 0033  PROCALCITON <0.10  --   --   --  WBC 3.8*  --  4.0 2.6*  LATICACIDVEN 1.9 1.4  --   --    Microbiology Recent Results (from the past 240 hour(s))  Blood Culture (routine x 2)     Status: None   Collection Time: 07/05/19  4:03 PM   Specimen: BLOOD RIGHT HAND  Result Value Ref Range Status   Specimen Description BLOOD RIGHT HAND  Final   Special Requests   Final    BOTTLES DRAWN AEROBIC AND ANAEROBIC Blood Culture results may not be optimal due to an inadequate volume of blood received in culture bottles   Culture   Final    NO GROWTH 5 DAYS Performed at [facility], [place] [address], [place], [place] [address]    Report Status 07/10/2019 FINAL  Final  Blood Culture (routine x 2)     Status: None   Collection Time: 07/05/19  4:05 PM   Specimen: BLOOD  Result Value Ref Range Status   Specimen Description BLOOD LEFT ANTECUBITAL  Final   Special Requests   Final    BOTTLES DRAWN AEROBIC AND ANAEROBIC Blood Culture results may not be optimal due to an inadequate volume of blood received in culture bottles   Culture   Final    NO GROWTH 5 DAYS Performed at [facility], [place] [address] [place], [place] [address]    Report Status 07/10/2019 FINAL  Final  MRSA PCR  Screening     Status: None   Collection Time: 07/06/19  5:08 PM   Specimen: Nasal Mucosa; Nasopharyngeal  Result Value Ref Range Status   MRSA by PCR NEGATIVE NEGATIVE Final    Comment:        The GeneXpert MRSA Assay (FDA approved for NASAL specimens only), is one component of a comprehensive MRSA colonization surveillance program. It is not intended to diagnose MRSA infection nor to guide or monitor treatment for MRSA infections. Performed at [facility], [place] [address] [place], [place] [address]      Medications:   . albuterol  2 puff Inhalation Q6H  . amLODipine  5 mg Oral Daily  . apixaban  5 mg Oral BID  . aspirin EC  81 mg Oral Daily  . dexamethasone (DECADRON) injection  6 mg Intravenous Q24H  . memantine  28 mg Oral QHS   And  . donepezil  10 mg Oral QHS  . escitalopram  5 mg Oral Daily  . feeding supplement (ENSURE ENLIVE)  237 mL Oral BID BM  . gabapentin  100 mg Oral QHS  . insulin aspart  0-5 Units Subcutaneous QHS  . insulin aspart  0-9 Units Subcutaneous TID WC  . insulin glargine  10 Units Subcutaneous BID  . loratadine  10 mg Oral Daily  . mouth rinse  15 mL Mouth Rinse BID  . multivitamin with minerals  1 tablet Oral Daily  . tamsulosin  0.4 mg Oral QPC supper   Continuous Infusions: . sodium chloride 10 mL/hr at 07/07/19 1017      LOS: 6 days   Barb Merino  [facility]  07/11/2019, 12:59 PM

## 2019-07-11 NOTE — Progress Notes (Signed)
Pt's daughter called for an update on her father. Pt spoke with daughter via face time video. Pt was engaging with daughter. Pt oriented to who daughter was but continue to have periods of confusion. Update was given to daughter. Conversation end.

## 2019-07-12 ENCOUNTER — Ambulatory Visit: Payer: Self-pay

## 2019-07-12 LAB — GLUCOSE, CAPILLARY
Glucose-Capillary: 114 mg/dL — ABNORMAL HIGH (ref 70–99)
Glucose-Capillary: 129 mg/dL — ABNORMAL HIGH (ref 70–99)
Glucose-Capillary: 204 mg/dL — ABNORMAL HIGH (ref 70–99)
Glucose-Capillary: 255 mg/dL — ABNORMAL HIGH (ref 70–99)

## 2019-07-12 MED ORDER — DEXAMETHASONE 6 MG PO TABS
6.0000 mg | ORAL_TABLET | Freq: Every day | ORAL | Status: DC
  Administered 2019-07-12 – 2019-07-13 (×2): 6 mg via ORAL
  Filled 2019-07-12 (×2): qty 1

## 2019-07-12 NOTE — Progress Notes (Signed)
[facility] PROGRESS NOTE    Progress Note  [name]  [id number] DOB: 1922-05-06 DOA: 07/05/2019 PCP: Gayland Curry, DO     Brief Narrative:   [name] is an 85 y.o. male past medical history of diabetes mellitus type 2, essential hypertension, coronary artery disease, history of stroke, dementia presented to the ED with several days of cough and shortness of breath.  80% on room air was brought into the ED placed on 5 L of oxygen. his saturations improved, chest x-ray showed bilateral opacities, SARS-CoV-2 PCR was positive.  Assessment/Plan:   Acute respiratory failure with hypoxia due to Pneumonia due to COVID-19 virus: He is now on room air satting greater than 94%. He has completed his course of IV remdesivir and steroids. Physical therapy evaluated the patient and recommended skilled nursing facility but the daughter refused to send him to skilled nursing facility. She refused for the patient to go to skilled nursing facility. Benefits of doing this have been explained to the daughter and she relates she understands.  Acute kidney injury: Prerenal azotemia resolved with IV fluid hydration.  Type 2 diabetes, controlled, with peripheral circulatory disorder (HCC) Fairly controlled no changes made to his medication.  History of pulmonary embolism: Continue Eliquis.  Essential hypertension: Blood pressure is fairly controlled continue to hold hydrochlorothiazide continue all other medications.  Dementia: Patient with advanced dementia significantly deconditioned. Physical therapy evaluated the patient recommended skilled nursing facility, but the daughter wants to take him home.  Family communication: 07/12/2019. Patient's daughter Ms. [name] called and updated about patient's condition.  Patient is medically stable.  He is 89 with deconditioning, does not need active medical attention.  Patient is medically stable.  If her daughter not able to take care of  him, he needs to go to long-term nursing home.  DVT prophylaxis: lovenox Family Communication: Daughter. Disposition Plan/Barrier to D/C: Daughter undecided.  Medically stable.  Transfer to MedSurg bed.   Code Status: Full code    Code Status Orders  (From admission, onward)         Start     Ordered   07/05/19 2010  Full code  Continuous     07/05/19 2010        Code Status History    Date Active Date Inactive Code Status Order ID Comments User Context   07/05/2019 1824 07/05/2019 2010 Full Code [id number]  Ned Grace ED   07/05/2019 1821 07/05/2019 1824 Full Code [id number]  Margarita Mail, PA-C ED   06/01/2019 2257 06/05/2019 2147 Full Code [id number]  Vianne Bulls, MD ED   04/17/2014 2008 04/19/2014 1418 Full Code [id number]  Shanda Howells, MD ED   Advance Care Planning Activity    Advance Directive Documentation     Most Recent Value  Type of Advance Directive  Healthcare Power of Attorney  Pre-existing out of facility DNR order (yellow form or pink MOST form)  -  "MOST" Form in Place?  -        IV Access:    Peripheral IV   Procedures and diagnostic studies:   No results found.   Medical Consultants:    None.  Anti-Infectives:  IV remdesivir, finished therapy. Subjective:   No overnight events.  On room air.  Afebrile.  Objective:    Vitals:   07/11/19 2321 07/12/19 0503 07/12/19 0504 07/12/19 0847  BP: 116/71  131/64 134/69  Pulse: 69 (!) 52  61  Resp: (!) 22 15 19 13   Temp:  97.8 F (36.6 C)  98 F (36.7 C) (!) 96.1 F (35.6 C)  TempSrc: Oral  Oral Axillary  SpO2: 98% 99%  100%  Weight:      Height:       SpO2: 100 % O2 Flow Rate (L/min): 2 L/min FiO2 (%): 97 %   Intake/Output Summary (Last 24 hours) at 07/12/2019 1150 Last data filed at 07/12/2019 0500 Gross per 24 hour  Intake 236 ml  Output 1050 ml  Net -814 ml   Filed Weights   07/06/19 1640  Weight: 76.3 kg    Exam: Physical Exam  Constitutional:   Pleasantly confused gentleman on room air, yells back his name on asking.  HENT:  Head: Normocephalic and atraumatic.  Eyes: Pupils are equal, round, and reactive to light.  Cardiovascular: Normal rate and regular rhythm.  Pulmonary/Chest: Breath sounds normal.  Abdominal: Bowel sounds are normal.  Musculoskeletal:     Cervical back: Neck supple.     Data Reviewed:    Labs: Basic Metabolic Panel: Recent Labs  Lab 07/05/19 1536 07/06/19 0204 07/07/19 0033 07/09/19 0335  NA 142 143 144 143  K 4.3 4.9 4.6 4.7  CL 104 105 105 103  CO2 29 30 29 30   GLUCOSE 242* 193* 226* 91  BUN 30* 29* 36* 35*  CREATININE 1.48* 1.29* 1.12 0.98  CALCIUM 8.9 8.9 8.9 9.3  MG  --   --  2.1  --    GFR Estimated Creatinine Clearance: 48.7 mL/min (by C-G formula based on SCr of 0.98 mg/dL). Liver Function Tests: Recent Labs  Lab 07/05/19 1536 07/06/19 0204 07/07/19 0033 07/09/19 0335  AST 32 35 47* 94*  ALT 31 30 38 107*  ALKPHOS 68 69 59 62  BILITOT 0.3 0.7 0.2* 0.3  PROT 6.3* 6.5 6.2* 6.5  ALBUMIN 3.1* 3.2* 2.9* 3.3*   No results for input(s): LIPASE, AMYLASE in the last 168 hours. No results for input(s): AMMONIA in the last 168 hours. Coagulation profile No results for input(s): INR, PROTIME in the last 168 hours. COVID-19 Labs  No results for input(s): DDIMER, FERRITIN, LDH, CRP in the last 72 hours.  Lab Results  Component Value Date   SARSCOV2NAA NEGATIVE 06/12/2019   [place] NEGATIVE 06/01/2019    CBC: Recent Labs  Lab 07/05/19 1536 07/06/19 0204 07/07/19 0033  WBC 3.8* 4.0 2.6*  NEUTROABS 2.2 2.9 1.9  HGB 9.7* 10.5* 9.0*  HCT 31.4* 34.1* 29.1*  MCV 96.3 96.6 95.4  PLT 170 162 179   Cardiac Enzymes: No results for input(s): CKTOTAL, CKMB, CKMBINDEX, TROPONINI in the last 168 hours. BNP (last 3 results) No results for input(s): PROBNP in the last 8760 hours. CBG: Recent Labs  Lab 07/11/19 0823 07/11/19 1201 07/11/19 1641 07/11/19 2034  07/12/19 0816  GLUCAP 134* 168* 161* 268* 114*   D-Dimer: No results for input(s): DDIMER in the last 72 hours. Hgb A1c: No results for input(s): HGBA1C in the last 72 hours. Lipid Profile: No results for input(s): CHOL, HDL, LDLCALC, TRIG, CHOLHDL, LDLDIRECT in the last 72 hours. Thyroid function studies: No results for input(s): TSH, T4TOTAL, T3FREE, THYROIDAB in the last 72 hours.  Invalid input(s): FREET3 Anemia work up: No results for input(s): VITAMINB12, FOLATE, FERRITIN, TIBC, IRON, RETICCTPCT in the last 72 hours. Sepsis Labs: Recent Labs  Lab 07/05/19 1536 07/06/19 0147 07/06/19 0204 07/07/19 0033  PROCALCITON <0.10  --   --   --   WBC 3.8*  --  4.0 2.6*  LATICACIDVEN  1.9 1.4  --   --    Microbiology Recent Results (from the past 240 hour(s))  Blood Culture (routine x 2)     Status: None   Collection Time: 07/05/19  4:03 PM   Specimen: BLOOD RIGHT HAND  Result Value Ref Range Status   Specimen Description BLOOD RIGHT HAND  Final   Special Requests   Final    BOTTLES DRAWN AEROBIC AND ANAEROBIC Blood Culture results may not be optimal due to an inadequate volume of blood received in culture bottles   Culture   Final    NO GROWTH 5 DAYS Performed at [facility], [place] [address], [place], [place] [address]    Report Status 07/10/2019 FINAL  Final  Blood Culture (routine x 2)     Status: None   Collection Time: 07/05/19  4:05 PM   Specimen: BLOOD  Result Value Ref Range Status   Specimen Description BLOOD LEFT ANTECUBITAL  Final   Special Requests   Final    BOTTLES DRAWN AEROBIC AND ANAEROBIC Blood Culture results may not be optimal due to an inadequate volume of blood received in culture bottles   Culture   Final    NO GROWTH 5 DAYS Performed at [facility], [place] [address], [place], [place] [address]    Report Status 07/10/2019 FINAL  Final  MRSA PCR Screening     Status: None   Collection Time: 07/06/19  5:08 PM   Specimen: Nasal Mucosa;  Nasopharyngeal  Result Value Ref Range Status   MRSA by PCR NEGATIVE NEGATIVE Final    Comment:        The GeneXpert MRSA Assay (FDA approved for NASAL specimens only), is one component of a comprehensive MRSA colonization surveillance program. It is not intended to diagnose MRSA infection nor to guide or monitor treatment for MRSA infections. Performed at [facility], [place] [address], [place],  [address]      Medications:   . albuterol  2 puff Inhalation Q6H  . amLODipine  5 mg Oral Daily  . apixaban  5 mg Oral BID  . aspirin EC  81 mg Oral Daily  . dexamethasone (DECADRON) injection  6 mg Intravenous Q24H  . memantine  28 mg Oral QHS   And  . donepezil  10 mg Oral QHS  . escitalopram  5 mg Oral Daily  . feeding supplement (ENSURE ENLIVE)  237 mL Oral BID BM  . gabapentin  100 mg Oral QHS  . insulin aspart  0-5 Units Subcutaneous QHS  . insulin aspart  0-9 Units Subcutaneous TID WC  . insulin glargine  10 Units Subcutaneous BID  . loratadine  10 mg Oral Daily  . mouth rinse  15 mL Mouth Rinse BID  . multivitamin with minerals  1 tablet Oral Daily  . tamsulosin  0.4 mg Oral QPC supper   Continuous Infusions: . sodium chloride 10 mL/hr at 07/07/19 1017      LOS: 7 days    Total time spent: 25 minutes  Imojean Yoshino  [facility]  07/12/2019, 11:50 AM

## 2019-07-12 NOTE — Progress Notes (Signed)
  Tele reported 4 beats of SVT HR up to 150s. MD aware. If sustained will give him a call per request.

## 2019-07-12 NOTE — Progress Notes (Signed)
Physical Therapy Treatment Patient Details Name: [name] MRN: [id number] DOB: [birth year] Today's Date: 07/12/2019    History of Present Illness [name] is an 85 y.o. male past medical history of diabetes mellitus type 2, essential hypertension coronary artery disease history of stroke dementia presents to the ED with several days of cough and shortness of breath.  80% on room air was brought into the ED placed on 5 L of oxygen his saturations improved, chest x-ray showed bilateral opacities, SARS-CoV-2 PCR was positive.    PT Comments    Pt making progress with mobility, was able to get oob this am and also take some few steps from bed to recliner by window. Noted pt lists and leans to left when sitting, standing and even ambulating. He did well following cues for all mobility throughout session. Pt was on room air and was able to maintain sats >90 throughout session.     Follow Up Recommendations  SNF;Supervision/Assistance - 24 hour;Other (comment)     Equipment Recommendations  Hospital bed;Other (comment)    Recommendations for Other Services       Precautions / Restrictions Precautions Precautions: Fall Restrictions Weight Bearing Restrictions: No    Mobility  Bed Mobility Overal bed mobility: Needs Assistance Bed Mobility: Supine to Sit     Supine to sit: Mod assist;HOB elevated Sit to supine: Mod assist;HOB elevated      Transfers Overall transfer level: Needs assistance Equipment used: Rolling walker (2 wheeled) Transfers: Sit to/from Stand Sit to Stand: Mod assist;+2 physical assistance;From elevated surface            Ambulation/Gait Ambulation/Gait assistance: Max assist Gait Distance (Feet): 4 Feet Assistive device: Rolling walker (2 wheeled) Gait Pattern/deviations: Step-to pattern;Shuffle;Staggering left;Drifts right/left     General Gait Details: leans to left with sitting, standing and also ambulation   Stairs              Wheelchair Mobility    Modified Rankin (Stroke Patients Only)       Balance Overall balance assessment: Needs assistance Sitting-balance support: Feet supported;Bilateral upper extremity supported Sitting balance-Leahy Scale: Poor   Postural control: Left lateral lean Standing balance support: During functional activity;Bilateral upper extremity supported Standing balance-Leahy Scale: Poor                              Cognition Arousal/Alertness: Awake/alert Behavior During Therapy: Flat affect Overall Cognitive Status: History of cognitive impairments - at baseline                                        Exercises      General Comments        Pertinent Vitals/Pain Pain Assessment: Faces Faces Pain Scale: Hurts little more Pain Location: w/ clean up Pain Descriptors / Indicators: Grimacing;Guarding Pain Intervention(s): Limited activity within patient's tolerance;Monitored during session    Home Living                      Prior Function            PT Goals (current goals can now be found in the care plan section) Acute Rehab PT Goals Patient Stated Goal: none states Progress towards PT goals: Progressing toward goals    Frequency    Min 3X/week      PT Plan Current  plan remains appropriate    Co-evaluation              AM-PAC PT "6 Clicks" Mobility   Outcome Measure  Help needed turning from your back to your side while in a flat bed without using bedrails?: A Lot Help needed moving from lying on your back to sitting on the side of a flat bed without using bedrails?: A Lot Help needed moving to and from a bed to a chair (including a wheelchair)?: Total Help needed standing up from a chair using your arms (e.g., wheelchair or bedside chair)?: A Lot Help needed to walk in hospital room?: A Lot Help needed climbing 3-5 steps with a railing? : Total 6 Click Score: 10    End of Session Equipment  Utilized During Treatment: Gait belt Activity Tolerance: Patient tolerated treatment well Patient left: in chair;with call bell/phone within reach;with chair alarm set;with nursing/sitter in room Nurse Communication: Mobility status PT Visit Diagnosis: Muscle weakness (generalized) (M62.81);Difficulty in walking, not elsewhere classified (R26.2)     Time: 1113-1140 PT Time Calculation (min) (ACUTE ONLY): 27 min  Charges:  $Therapeutic Activity: 23-37 mins                     Horald Chestnut, PT    Delford Field 07/12/2019, 4:41 PM

## 2019-07-12 NOTE — Progress Notes (Signed)
Nutrition Follow-up  INTERVENTION:   -Ensure Enlive po BID, each supplement provides 350 kcal and 20 grams of protein  -Pt receiving ensure enlive at Breakfast and Magic cup BID with lunch and dinner, each supplement provides 290 kcal and 9 grams of protein, automatically on meal trays to optimize nutritional intake.  NUTRITION DIAGNOSIS:   Increased nutrient needs related to acute illness as evidenced by estimated needs.  GOAL:   Patient will meet greater than or equal to 90% of their needs  MONITOR:   PO intake, Supplement acceptance, Labs, Weight trends, I & O's  REASON FOR ASSESSMENT:   Malnutrition Screening Tool    ASSESSMENT:   85 y.o. male past medical history of diabetes mellitus type 2, essential hypertension coronary artery disease history of stroke dementia presents to the ED with several days of cough and shortness of breath.  80% on room air was brought into the ED placed on 5 L of oxygen his saturations improved, chest x-ray showed bilateral opacities, SARS-CoV-2 PCR was positive 12/3: COVID+  Per MD pt is medically ready for d/c. Home with family vs SNF placement for deconditioning.   Pt is consuming 25-90% of meals at this time. Pt is drinking Ensure supplements.   Medications: Multivitamin with minerals daily, lantus  Labs reviewed: CBGs: 114-129  NUTRITION - FOCUSED PHYSICAL EXAM:  Working remotely.  Diet Order:   Diet Order            Diet - low sodium heart healthy        Diet - low sodium heart healthy        DIET DYS 2 Room service appropriate? Yes; Fluid consistency: Nectar Thick  Diet effective now              EDUCATION NEEDS:   No education needs have been identified at this time  Skin:  Skin Assessment: Reviewed RN Assessment  Last BM:  12/9  Height:   Ht Readings from Last 1 Encounters:  07/06/19 6' (1.829 m)    Weight:   Wt Readings from Last 1 Encounters:  07/06/19 76.3 kg    Ideal Body Weight:  80.9 kg  BMI:   Body mass index is 22.81 kg/m.  Estimated Nutritional Needs:   Kcal:  1700-1900  Protein:  70-80g  Fluid:  1.7L/day  [place], [place], [place] Pager [phone] After Hours Pager

## 2019-07-12 NOTE — Plan of Care (Signed)
  Problem: Education: Goal: Knowledge of risk factors and measures for prevention of condition will improve Outcome: Progressing   Problem: Coping: Goal: Psychosocial and spiritual needs will be supported Outcome: Progressing   Problem: Respiratory: Goal: Will maintain a patent airway Outcome: Progressing Goal: Complications related to the disease process, condition or treatment will be avoided or minimized Outcome: Progressing   

## 2019-07-13 LAB — GLUCOSE, CAPILLARY
Glucose-Capillary: 203 mg/dL — ABNORMAL HIGH (ref 70–99)
Glucose-Capillary: 205 mg/dL — ABNORMAL HIGH (ref 70–99)
Glucose-Capillary: 243 mg/dL — ABNORMAL HIGH (ref 70–99)
Glucose-Capillary: 235 mg/dL — ABNORMAL HIGH (ref 70–99)

## 2019-07-13 NOTE — Clinical Social Work Note (Signed)
Clinical Social Worker facilitated patient discharge including contacting patient family.  Family agreeable with plan.  CSW arranged ambulance transport via [facility] to [address]  [place] [address].     [place], [place]

## 2019-07-13 NOTE — Progress Notes (Addendum)
[facility] PROGRESS NOTE    Progress Note  [name]  [id number] DOB: Jul 16, 1922 DOA: 07/05/2019 PCP: Gayland Curry, DO     Brief Narrative:   [name] is an 85 y.o. male past medical history of diabetes mellitus type 2, essential hypertension, coronary artery disease, history of stroke, dementia presented to the ED with several days of cough and shortness of breath.  80% on room air was brought into the ED placed on 5 L of oxygen. his saturations improved, chest x-ray showed bilateral opacities, SARS-CoV-2 PCR was positive.  Assessment/Plan:   Acute respiratory failure with hypoxia due to Pneumonia due to COVID-19 virus: Completely treated and asymptomatic.  On room air.  Acute kidney injury: Prerenal azotemia resolved with IV fluid hydration.  Resolved.  Type 2 diabetes, controlled, with peripheral circulatory disorder (HCC) Fairly controlled no changes made to his medication.  History of pulmonary embolism: Continue Eliquis.  Essential hypertension: Blood pressure is fairly controlled continue to hold hydrochlorothiazide continue all other medications.  Dementia: Patient with advanced dementia significantly deconditioned. Physical therapy evaluated the patient recommended skilled nursing facility, but the daughter wants to take him home.  Disposition: Patient is medically stable.  Daughter wanting to take patient home and not sending him to nursing home.  Discharge when arrangements are available.  Addendum, 11:48 AM: Patient's daughter did FaceTime with patient and she was agreeable to patient discharge home.  Social worker arranging ambulance transfer. Patient has received ninth dose of dexamethasone today, completely asymptomatic, he is diabetic so will discontinue further steroid therapy. Patient's daughter was against the use of Haldol that was discontinued. Medication reconciliation reviewed, necessary changes done and finalized from previous  providers med rec.   DVT prophylaxis: lovenox Family Communication: Daughter did FaceTime with patient and nurse at the bedside. Disposition Plan/Barrier to D/C: Daughter undecided.  Medically stable.  Transfer to MedSurg bed.   Code Status: Full code    Code Status Orders  (From admission, onward)         Start     Ordered   07/05/19 2010  Full code  Continuous     07/05/19 2010        Code Status History    Date Active Date Inactive Code Status Order ID Comments User Context   07/05/2019 1824 07/05/2019 2010 Full Code [id number]  Margarita Mail, PA-C ED   07/05/2019 1821 07/05/2019 1824 Full Code [id number]  Margarita Mail, PA-C ED   06/01/2019 2257 06/05/2019 2147 Full Code [id number]  Vianne Bulls, MD ED   04/17/2014 2008 04/19/2014 1418 Full Code [id number]  Shanda Howells, MD ED   Advance Care Planning Activity    Advance Directive Documentation     Most Recent Value  Type of Advance Directive  Healthcare Power of Attorney  Pre-existing out of facility DNR order (yellow form or pink MOST form)  --  "MOST" Form in Place?  --        IV Access:    Peripheral IV   Procedures and diagnostic studies:   No results found.   Medical Consultants:    None.  Anti-Infectives:  IV remdesivir, finished therapy. Subjective:   No overnight events.  Was sitting in the couch and looks comfortable. He asked my hand and kissed them.  Objective:    Vitals:   07/12/19 1900 07/12/19 2000 07/12/19 2036 07/12/19 2300  BP:   126/70 (!) 146/71  Pulse: 82 96 82 88  Resp: 15 (!) 27 17 16  Temp:   (!) 97.1 F (36.2 C) 97.9 F (36.6 C)  TempSrc:   Oral Oral  SpO2: 96% 91%    Weight:      Height:       SpO2: 91 % O2 Flow Rate (L/min): 2 L/min FiO2 (%): 97 %   Intake/Output Summary (Last 24 hours) at 07/13/2019 1123 Last data filed at 07/13/2019 [id number] Gross per 24 hour  Intake 1414 ml  Output 1350 ml  Net 64 ml   Filed Weights   07/06/19 1640  Weight: 76.3 kg     Exam: Physical Exam  Constitutional:  Pleasantly confused gentleman on room air, yells back his name on asking. Sitting in couch, on room air.  HENT:  Head: Normocephalic and atraumatic.  Eyes: Pupils are equal, round, and reactive to light.  Cardiovascular: Normal rate and regular rhythm.  Pulmonary/Chest: Breath sounds normal.  Abdominal: Bowel sounds are normal.  Musculoskeletal:     Cervical back: Neck supple.     Data Reviewed:    Labs: Basic Metabolic Panel: Recent Labs  Lab 07/07/19 0033 07/09/19 0335  NA 144 143  K 4.6 4.7  CL 105 103  CO2 29 30  GLUCOSE 226* 91  BUN 36* 35*  CREATININE 1.12 0.98  CALCIUM 8.9 9.3  MG 2.1  --    GFR Estimated Creatinine Clearance: 48.7 mL/min (by C-G formula based on SCr of 0.98 mg/dL). Liver Function Tests: Recent Labs  Lab 07/07/19 0033 07/09/19 0335  AST 47* 94*  ALT 38 107*  ALKPHOS 59 62  BILITOT 0.2* 0.3  PROT 6.2* 6.5  ALBUMIN 2.9* 3.3*   No results for input(s): LIPASE, AMYLASE in the last 168 hours. No results for input(s): AMMONIA in the last 168 hours. Coagulation profile No results for input(s): INR, PROTIME in the last 168 hours. COVID-19 Labs  No results for input(s): DDIMER, FERRITIN, LDH, CRP in the last 72 hours.  Lab Results  Component Value Date   SARSCOV2NAA NEGATIVE 06/12/2019   [place] NEGATIVE 06/01/2019    CBC: Recent Labs  Lab 07/07/19 0033  WBC 2.6*  NEUTROABS 1.9  HGB 9.0*  HCT 29.1*  MCV 95.4  PLT 179   Cardiac Enzymes: No results for input(s): CKTOTAL, CKMB, CKMBINDEX, TROPONINI in the last 168 hours. BNP (last 3 results) No results for input(s): PROBNP in the last 8760 hours. CBG: Recent Labs  Lab 07/12/19 1145 07/12/19 1622 07/12/19 1743 07/12/19 2034 07/13/19 0742  GLUCAP 129* 203* 204* 255* 205*   D-Dimer: No results for input(s): DDIMER in the last 72 hours. Hgb A1c: No results for input(s): HGBA1C in the last 72 hours. Lipid Profile: No  results for input(s): CHOL, HDL, LDLCALC, TRIG, CHOLHDL, LDLDIRECT in the last 72 hours. Thyroid function studies: No results for input(s): TSH, T4TOTAL, T3FREE, THYROIDAB in the last 72 hours.  Invalid input(s): FREET3 Anemia work up: No results for input(s): VITAMINB12, FOLATE, FERRITIN, TIBC, IRON, RETICCTPCT in the last 72 hours. Sepsis Labs: Recent Labs  Lab 07/07/19 0033  WBC 2.6*   Microbiology Recent Results (from the past 240 hour(s))  Blood Culture (routine x 2)     Status: None   Collection Time: 07/05/19  4:03 PM   Specimen: BLOOD RIGHT HAND  Result Value Ref Range Status   Specimen Description BLOOD RIGHT HAND  Final   Special Requests   Final    BOTTLES DRAWN AEROBIC AND ANAEROBIC Blood Culture results may not be optimal due to an inadequate volume of  blood received in culture bottles   Culture   Final    NO GROWTH 5 DAYS Performed at [facility], [place] [address], [place], [place] [address]    Report Status 07/10/2019 FINAL  Final  Blood Culture (routine x 2)     Status: None   Collection Time: 07/05/19  4:05 PM   Specimen: BLOOD  Result Value Ref Range Status   Specimen Description BLOOD LEFT ANTECUBITAL  Final   Special Requests   Final    BOTTLES DRAWN AEROBIC AND ANAEROBIC Blood Culture results may not be optimal due to an inadequate volume of blood received in culture bottles   Culture   Final    NO GROWTH 5 DAYS Performed at [facility], [place] [address] [place], [place] [address]    Report Status 07/10/2019 FINAL  Final  MRSA PCR Screening     Status: None   Collection Time: 07/06/19  5:08 PM   Specimen: Nasal Mucosa; Nasopharyngeal  Result Value Ref Range Status   MRSA by PCR NEGATIVE NEGATIVE Final    Comment:        The GeneXpert MRSA Assay (FDA approved for NASAL specimens only), is one component of a comprehensive MRSA colonization surveillance program. It is not intended to diagnose MRSA infection nor to guide or monitor  treatment for MRSA infections. Performed at [facility], [place] [address] [place], [place] [address]      Medications:   . albuterol  2 puff Inhalation Q6H  . amLODipine  5 mg Oral Daily  . apixaban  5 mg Oral BID  . aspirin EC  81 mg Oral Daily  . dexamethasone  6 mg Oral Daily  . memantine  28 mg Oral QHS   And  . donepezil  10 mg Oral QHS  . escitalopram  5 mg Oral Daily  . feeding supplement (ENSURE ENLIVE)  237 mL Oral BID BM  . gabapentin  100 mg Oral QHS  . insulin aspart  0-5 Units Subcutaneous QHS  . insulin aspart  0-9 Units Subcutaneous TID WC  . insulin glargine  10 Units Subcutaneous BID  . loratadine  10 mg Oral Daily  . mouth rinse  15 mL Mouth Rinse BID  . multivitamin with minerals  1 tablet Oral Daily  . tamsulosin  0.4 mg Oral QPC supper   Continuous Infusions: . sodium chloride 10 mL/hr at 07/07/19 1017      LOS: 8 days    Total time spent: 20 minutes  Wali Reinheimer  [facility]  07/13/2019, 11:23 AM

## 2019-07-13 NOTE — Progress Notes (Signed)
Placed Facetime call with daughter and updated her.

## 2019-07-13 NOTE — Progress Notes (Signed)
Reviewed discharge instructions with daughter [name] over the phone.

## 2019-07-13 NOTE — Progress Notes (Signed)
Mal Amabile, patient's daughter to notify her that [facility] still has patient on the list to be picked up.

## 2019-07-16 ENCOUNTER — Other Ambulatory Visit: Payer: Self-pay

## 2019-07-16 NOTE — Patient Outreach (Signed)
[facility]) Care Management  [place]  07/16/2019   [name] 1922-07-09 [id number]  Subjective: Telephone call to daughter. She reports she is doing what she can with her dad.  She reports that does have a hoyer lift to assist in which she states she used last night to help in care for him. Patient is incontinent of bowel and bladder per daughter.  She states that home health has not made contact yet but she will be following up with them today. She states she wants to get her dad back to walking again and that would help her care for him.  Discussed with her the possible reality that he may not walk again.  She states that she will continue to care for patient as best she can as she does not want to place him in a facility again. Daughter states that she has a job that she works from home but has to be on the screen most of the time. Discussed patient COVID status and care for patient.  Discussed his diabetes.  Daughter states she has not checked his sugar as she has been so involved with his care.  She states patient has all his medications and is taking as prescribed.  Daughter states she has to go as she needs to sign into work but states she will call CM back.    Objective:   Encounter Medications:  Outpatient Encounter Medications as of 07/16/2019  Medication Sig Note  . albuterol (VENTOLIN HFA) 108 (90 Base) MCG/ACT inhaler Inhale 2 puffs into the lungs every 4 (four) hours as needed for wheezing or shortness of breath. INHALE TWO PUFFS EVERY 4-6 HOURS AS NEEDED FOR DIFFICULTY BREATHING,WHEEZING OR COUGH   . Alcohol Swabs (B-D SINGLE USE SWABS REGULAR) PADS USE AS DIRECTED WITH CHECKING BLOOD SUGAR THREE TIMES DAILY   . amLODipine (NORVASC) 5 MG tablet Take 1 tablet (5 mg total) by mouth daily.   [name] apixaban (ELIQUIS) 5 MG TABS tablet Take 5 mg by mouth 2 (two) times daily. 07/05/2019: 0800 and 2000  . aspirin EC 81 MG tablet Take 1 tablet (81 mg total) by  mouth daily. 07/05/2019: 0800  . Blood Glucose Calibration (ACCU-CHEK AVIVA) SOLN Use as Directed. Dx: E11.51   . gabapentin (NEURONTIN) 100 MG capsule Take 100 mg by mouth at bedtime.   [name] glucose blood (ACCU-CHEK AVIVA) test strip Use to test blood sugar three times daily. Dx: E11.51   . hydrochlorothiazide (HYDRODIURIL) 25 MG tablet Take 1 tablet (25 mg total) by mouth daily.   . Insulin Glargine, 1 Unit Dial, (TOUJEO SOLOSTAR) 300 UNIT/ML SOPN Inject 50 Units into the skin at bedtime.   . Insulin Pen Needle (B-D UF III MINI PEN NEEDLES) 31G X 5 MM MISC USE AS DIRECTED.   Elmore Guise Devices (ACCU-CHEK SOFTCLIX) lancets Use as instructed with checking blood sugar three times daily. Dx: E11.51   . Loratadine 10 MG CAPS Take 1 capsule (10 mg total) by mouth daily.   [name] losartan (COZAAR) 50 MG tablet Take 1 tablet (50 mg total) by mouth daily.   . Memantine HCl-Donepezil HCl (NAMZARIC) 28-10 MG CP24 Take 1 capsule by mouth daily.   . Multiple Vitamins-Minerals (SENTRY SENIOR) TABS Take 1 tablet by mouth daily.   . simvastatin (ZOCOR) 20 MG tablet Take 1 tablet (20 mg total) by mouth daily.   . tamsulosin (FLOMAX) 0.4 MG CAPS capsule Take 1 capsule (0.4 mg total) by mouth daily after supper.   [name]  escitalopram (LEXAPRO) 5 MG tablet Take 1 tablet (5 mg total) by mouth daily.    No facility-administered encounter medications on file as of 07/16/2019.    Functional Status:  In your present state of health, do you have any difficulty performing the following activities: 07/16/2019 07/06/2019  Hearing? N Y  Vision? N N  Difficulty concentrating or making decisions? [name]  Comment Patient needs assistance with all aspects of care. -  Walking or climbing stairs? Y Y  Dressing or bathing? Y Y  Doing errands, shopping? [name]  Preparing Food and eating ? Y -  Comment needs help with all aspects of care. -  Using the Toilet? Y -  In the past six months, have you accidently leaked urine? Y -  Do you have  problems with loss of bowel control? Y -  Managing your Medications? Y -  Managing your Finances? Y -  Housekeeping or managing your Housekeeping? Y -  Some recent data might be hidden    Fall/Depression Screening: Fall Risk  07/16/2019 06/07/2019 06/01/2019  Falls in the past year? 1 1 1   Number falls in past yr: 1 1 1   Injury with Fall? 0 1 1  Comment - - -  Risk Factor Category  - - -  Risk for fall due to : History of fall(s) - -  Follow up Falls prevention discussed - -   PHQ 2/9 Scores 07/16/2019 01/22/2019 12/01/2018 04/26/2018 07/05/2017 12/29/2016 12/10/2016  PHQ - 2 Score - 0 0 0 0 0 0  Exception Documentation Other- indicate reason in comment box - - - - - -  Not completed unable to assess - - - - - -    Assessment:  Patient recently discharged from the hospital to the care of daughter.  She is attempting to manage his care and work from home.    Plan:  [facility] CM Care Plan Problem One     Most Recent Value  Care Plan Problem One  Recent Hospitalization COVID  Role Documenting the Problem One  Care Management Telephonic Coordinator  Care Plan for Problem One  Active  [facility] Long Term Goal   Patient will not experience unplanned hospital readmission within the next 31 days  [facility] Long Term Goal Start Date  07/16/19  Interventions for Problem One Long Term Goal  RN CM discussed with daughter COVID and care for patient.  Dicussed follow up appointments and home health.  [facility] CM Short Term Goal #1   Patient will have home health initial visit within the next 7 days  [facility] CM Short Term Goal #1 Start Date  07/16/19  Interventions for Short Term Goal #1  Discussed home health and visit expectation.  [facility] CM Short Term Goal #2   Patient will have follow up with PCP scheduled within 21 days.  [facility] CM Short Term Goal #2 Start Date  07/16/19  Interventions for Short Term Goal #2  RN CM stressed importance of follow up with PCP.     RN CM will provide ongoing education and support to patient  through phone calls.   RN CM will send welcome packet with consent to patient/caregiver.   RN CM will send initial barriers letter, assessment, and care plan to primary care physician.   RN CM will contact caregiver/patient next week and caregiver/patient agrees to next contact.    Jone Baseman, RN, MSN [facility] Care Management Care Management Coordinator Direct Line [phone] Cell [phone] Toll Free: [phone]  Fax: [phone]

## 2019-07-17 ENCOUNTER — Telehealth: Payer: Self-pay | Admitting: Internal Medicine

## 2019-07-17 ENCOUNTER — Telehealth: Payer: Self-pay

## 2019-07-17 ENCOUNTER — Ambulatory Visit: Payer: Medicare HMO | Admitting: Family

## 2019-07-17 NOTE — Telephone Encounter (Signed)
See note from NP on call last night.

## 2019-07-17 NOTE — Telephone Encounter (Signed)
Patients daughter called to schedule an appointment for her father I made appointment for Wednesday at 10:00 with Dr. Mariea Clonts but I  Didn't realize  an appointment had been made by Judson Roch for today at 1:00 with Dinah so I called the patients daughter and spoke with her about this and she said no she could not do this appointment time as she had to work and did not wish to take extra time off that's why she had called to make the new appointment I was then informed by Chrae that Dr. Mariea Clonts would be working at [facility] tommorrow so I changed the appointment to Wednesday at 10:00 with Dinah for a tele visit as the patient has COVID and can not come to office and when I tried to call patients daughter back I got the answering machine so I left a message about the new appointment with Dinah not Dr. Mariea Clonts

## 2019-07-17 NOTE — Telephone Encounter (Addendum)
Incoming call left on clinical intake voicemail (no name given) FYI to Dr.Reed informing patients daughter declined services today for PT, another attempt will be made to do PT on Thursday 07/19/2019.   If you have questions or concern call [phone]

## 2019-07-17 NOTE — Telephone Encounter (Signed)
-----   Message from Nickola Major, NP sent at 07/16/2019  8:19 PM EST ----- Daughter, Jahier Lunday, called about her Dad having 100.8 temperature. She has given Tylenol to her dad already. Advised to schedule a televisit tomorrow at [facility]. Advised that if dad gets SOB, change in LOC and hypotensive then he needs to go to ER. BP right now is 108/82. Advised to hold Cozaar and Hydrodiuril today. She has not given his medications for today since she works. Patient is eating and drinking. No other complaints.  FYI.  [place]

## 2019-07-18 ENCOUNTER — Other Ambulatory Visit: Payer: Self-pay

## 2019-07-18 ENCOUNTER — Ambulatory Visit: Payer: Self-pay | Admitting: Internal Medicine

## 2019-07-18 ENCOUNTER — Ambulatory Visit (INDEPENDENT_AMBULATORY_CARE_PROVIDER_SITE_OTHER): Payer: Medicare HMO | Admitting: Family

## 2019-07-18 ENCOUNTER — Encounter: Payer: Self-pay | Admitting: Family

## 2019-07-18 ENCOUNTER — Ambulatory Visit: Payer: Self-pay | Admitting: Family

## 2019-07-18 DIAGNOSIS — R05 Cough: Secondary | ICD-10-CM | POA: Diagnosis not present

## 2019-07-18 DIAGNOSIS — R059 Cough, unspecified: Secondary | ICD-10-CM

## 2019-07-18 DIAGNOSIS — R5381 Other malaise: Secondary | ICD-10-CM | POA: Diagnosis not present

## 2019-07-18 NOTE — Patient Instructions (Signed)
Take tylenol 500 mg tablet one by mouth every 6 hours as needed for pain or fever.

## 2019-07-18 NOTE — Progress Notes (Signed)
This service is provided via telemedicine  No vital signs collected/recorded due to the encounter was a telemedicine visit.   Location of patient (ex: home, work):  Home   Patient consents to a telephone visit:  Yes  Location of the provider (ex: office, home):  Office   Name of any referring provider:  Hollace Kinnier, [place] of all persons participating in the telemedicine service and their role in the encounter:  Marlowe Sax, NP, Ruthell Rummage CMA, and patient   Time spent on call:  Ruthell Rummage CMA spent 6 minutes on phone with patient.    Provider: Maurya Nethery FNP-C  Gayland Curry, DO  Patient Care Team: Gayland Curry, DO as PCP - General (Geriatric Medicine) Macarthur Critchley, [place] as Referring Physician (Optometry) Delice Lesch Lezlie Octave, MD as Consulting Physician (Neurology) Jon Billings, RN as [place] Management  Extended Emergency Contact Information Primary Emergency Contact: Brackeen,Mary Address: [address]          [place], [place] [address] [name] of [place] Phone: [phone] Relation: Daughter Secondary Emergency Contact: [name] [place] of [place] Phone: [phone] Mobile Phone: [phone] Relation: Other  Code Status:  DNR Goals of care: Advanced Directive information Advanced Directives 07/16/2019  Does Patient Have a Medical Advance Directive? Yes  Type of Advance Directive [place]  Does patient want to make changes to medical advance directive? No - Patient declined  Copy of [place] in Chart? -  Would patient like information on creating a medical advance directive? -     Chief Complaint  Patient presents with  . Acute Visit    Patient tested positive for COVID and is running a fever, Fever has come down   . Medication Management    daughter states nurse told not to give hydrochlorothiazide and losartan     HPI:  Pt is a 85 y.o. male seen  today for an acute visit for evaluation of fever.Patient's daughter Lio Guess provides HPI information due to patient's cognitive impairment.she states patient had high temperature yesterday did not know whether to give tylenol for fever.she states patient was admitted at the hospital 07/05/2019 and was discharged to Rehab tested positive for COVID-19. She states temperature is back to normal today.He continues to cough and has occasional wheezing.she gives him his breathing inhalers with much improvement. Patient requires total care assistance including feeding.He can hold and eat a sandwich by himself but not holding a spoon.He has right side weakness.    Past Medical History:  Diagnosis Date  . Arthritis   . CAD (coronary artery disease)    s/p DES to LAD 06/2013  . Dementia with behavioral disturbance ([place])   . Depression   . Dyslipidemia   . Glaucoma   . Hyperglycemia 04/17/2014  . Hyperlipidemia   . Hypertension   . Stroke ([place])   . Type 2 diabetes mellitus ([place])    Past Surgical History:  Procedure Laterality Date  . CARDIAC CATHETERIZATION  06/2013   DES to LAD by Dr Tamala Julian  . Right Cataract      Allergies  Allergen Reactions  . Lisinopril Cough    Outpatient Encounter Medications as of 07/18/2019  Medication Sig  . albuterol (VENTOLIN HFA) 108 (90 Base) MCG/ACT inhaler Inhale 2 puffs into the lungs every 4 (four) hours as needed for wheezing or shortness of breath. INHALE TWO PUFFS EVERY 4-6 HOURS AS NEEDED FOR DIFFICULTY BREATHING,WHEEZING OR  COUGH  . Alcohol Swabs (B-D SINGLE USE SWABS REGULAR) PADS USE AS DIRECTED WITH CHECKING BLOOD SUGAR THREE TIMES DAILY  . amLODipine (NORVASC) 5 MG tablet Take 1 tablet (5 mg total) by mouth daily.  [name] apixaban (ELIQUIS) 5 MG TABS tablet Take 5 mg by mouth 2 (two) times daily.  [name] aspirin EC 81 MG tablet Take 1 tablet (81 mg total) by mouth daily.  . Blood Glucose Calibration (ACCU-CHEK AVIVA) SOLN Use as Directed. Dx: E11.51  .  escitalopram (LEXAPRO) 5 MG tablet Take 1 tablet (5 mg total) by mouth daily.  [name] gabapentin (NEURONTIN) 100 MG capsule Take 100 mg by mouth at bedtime.  [name] glucose blood (ACCU-CHEK AVIVA) test strip Use to test blood sugar three times daily. Dx: E11.51  . hydrochlorothiazide (HYDRODIURIL) 25 MG tablet Take 1 tablet (25 mg total) by mouth daily.  . insulin glargine (LANTUS) 100 UNIT/ML injection Inject 7 Units into the skin daily.  . Insulin Pen Needle (B-D UF III MINI PEN NEEDLES) 31G X 5 MM MISC USE AS DIRECTED.  Elmore Guise Devices (ACCU-CHEK SOFTCLIX) lancets Use as instructed with checking blood sugar three times daily. Dx: E11.51  . Loratadine 10 MG CAPS Take 1 capsule (10 mg total) by mouth daily.  [name] losartan (COZAAR) 50 MG tablet Take 1 tablet (50 mg total) by mouth daily.  . Memantine HCl-Donepezil HCl (NAMZARIC) 28-10 MG CP24 Take 1 capsule by mouth daily.  . Multiple Vitamins-Minerals (SENTRY SENIOR) TABS Take 1 tablet by mouth daily.  . simvastatin (ZOCOR) 20 MG tablet Take 1 tablet (20 mg total) by mouth daily.  . tamsulosin (FLOMAX) 0.4 MG CAPS capsule Take 1 capsule (0.4 mg total) by mouth daily after supper.  . [DISCONTINUED] Insulin Glargine, 1 Unit Dial, (TOUJEO SOLOSTAR) 300 UNIT/ML SOPN Inject 50 Units into the skin at bedtime.   No facility-administered encounter medications on file as of 07/18/2019.    Review of Systems  Unable to perform ROS: Dementia  Constitutional: Negative for appetite change, chills, fatigue and fever.  HENT: Negative for congestion, rhinorrhea, sinus pressure, sinus pain, sneezing, sore throat, tinnitus and trouble swallowing.   Eyes: Negative for discharge, redness and itching.  Respiratory: Positive for wheezing. Negative for chest tightness and shortness of breath.        Cough   Cardiovascular: Negative for chest pain, palpitations and leg swelling.  Gastrointestinal: Negative for abdominal distention, abdominal pain, constipation, diarrhea,  nausea and vomiting.  Endocrine: Negative for cold intolerance, heat intolerance, polydipsia, polyphagia and polyuria.  Genitourinary:       Incontinent   Musculoskeletal: Positive for gait problem. Negative for arthralgias and myalgias.  Skin: Negative for color change, pallor and rash.  Neurological: Negative for dizziness, seizures, light-headedness, numbness and headaches.       Right side weakness  Hematological: Does not bruise/bleed easily.  Psychiatric/Behavioral: Positive for confusion. Negative for agitation and sleep disturbance. The patient is not nervous/anxious.    Immunization History  Administered Date(s) Administered  . Fluad Quad(high Dose 65+) 04/12/2019  . Influenza, High Dose Seasonal PF 06/22/2013, 05/30/2015  . Influenza,inj,Quad PF,6+ Mos 04/19/2014, 04/02/2016, 04/01/2017, 04/26/2018  . Pneumococcal Conjugate-13 07/25/2014  . Pneumococcal Polysaccharide-23 08/03/2011, 06/22/2013  . Td 10/22/2013  . Tdap 07/30/2015  . Zoster Recombinat (Shingrix) 05/20/2017, 07/20/2017   Pertinent  Health Maintenance Due  Topic Date Due  . OPHTHALMOLOGY EXAM  06/14/2018  . FOOT EXAM  10/05/2018  . HEMOGLOBIN A1C  11/30/2019  . INFLUENZA VACCINE  Completed  .  PNA vac Low Risk Adult  Completed   Fall Risk  07/18/2019 07/16/2019 06/07/2019 06/01/2019 04/11/2019  Falls in the past year? 0 1 1 1  0  Number falls in past yr: 0 1 1 1  0  Injury with Fall? 0 0 1 1 0  Comment - - - - -  Risk Factor Category  - - - - -  Risk for fall due to : - History of fall(s) - - -  Follow up - Falls prevention discussed - - -   There were no vitals filed for this visit. There is no height or weight on file to calculate BMI. Physical Exam Unable to complete on telephone visit   Labs reviewed: Recent Labs    07/06/19 0204 07/07/19 0033 07/09/19 0335  NA 143 144 143  K 4.9 4.6 4.7  CL 105 105 103  CO2 30 29 30   GLUCOSE 193* 226* 91  BUN 29* 36* 35*  CREATININE 1.29* 1.12 0.98   CALCIUM 8.9 8.9 9.3  MG  --  2.1  --    Recent Labs    07/06/19 0204 07/07/19 0033 07/09/19 0335  AST 35 47* 94*  ALT 30 38 107*  ALKPHOS 69 59 62  BILITOT 0.7 0.2* 0.3  PROT 6.5 6.2* 6.5  ALBUMIN 3.2* 2.9* 3.3*   Recent Labs    07/05/19 1536 07/06/19 0204 07/07/19 0033  WBC 3.8* 4.0 2.6*  NEUTROABS 2.2 2.9 1.9  HGB 9.7* 10.5* 9.0*  HCT 31.4* 34.1* 29.1*  MCV 96.3 96.6 95.4  PLT 170 162 179   Lab Results  Component Value Date   TSH 1.527 06/01/2019   Lab Results  Component Value Date   HGBA1C 6.3 (H) 06/02/2019   Lab Results  Component Value Date   CHOL 134 06/02/2019   HDL 48 06/02/2019   LDLCALC 74 06/02/2019   LDLDIRECT 92.0 11/24/2015   TRIG 126 07/05/2019   CHOLHDL 2.8 06/02/2019    Significant Diagnostic Results in last 30 days:  DG Chest Port 1 View  Result Date: 07/05/2019 CLINICAL DATA:  Shortness of breath. EXAM: PORTABLE CHEST 1 VIEW COMPARISON:  April 12, 2019. FINDINGS: The heart size and mediastinal contours are within normal limits. No pneumothorax or pleural effusion is noted. Mild right midlung opacity is noted concerning for atelectasis or infiltrate. Probable left basilar subsegmental atelectasis is noted. The visualized skeletal structures are unremarkable. IMPRESSION: Mild right midlung opacity is noted concerning for atelectasis or infiltrate. Probable left basilar subsegmental atelectasis is noted. Followup radiographs are recommended until resolution. Aortic Atherosclerosis (ICD10-I70.0). Electronically Signed   By: Marijo Conception M.D.   On: 07/05/2019 16:54    Assessment/Plan 1. Cough On going status post COVID-19 positive.  Had elevated Temp 07/17/2019 but none today.No shortness of breath.continue on Albuterol every 4 hours as needed.  2. Debility Requires assistance with ADL's.Daughter states needs help.Has Home health coming for initial screening 07/19/2019.   Family/ staff Communication: Reviewed plan of care with patient  and daughter.   Labs/tests ordered: None   Spent 11 minutes of non-face to face with patient    Sandrea Hughs, NP

## 2019-07-19 ENCOUNTER — Telehealth: Payer: Self-pay

## 2019-07-19 ENCOUNTER — Telehealth: Payer: Self-pay | Admitting: *Deleted

## 2019-07-19 DIAGNOSIS — I82403 Acute embolism and thrombosis of unspecified deep veins of lower extremity, bilateral: Secondary | ICD-10-CM | POA: Diagnosis not present

## 2019-07-19 DIAGNOSIS — I5032 Chronic diastolic (congestive) heart failure: Secondary | ICD-10-CM | POA: Diagnosis not present

## 2019-07-19 DIAGNOSIS — D631 Anemia in chronic kidney disease: Secondary | ICD-10-CM | POA: Diagnosis not present

## 2019-07-19 DIAGNOSIS — E1122 Type 2 diabetes mellitus with diabetic chronic kidney disease: Secondary | ICD-10-CM | POA: Diagnosis not present

## 2019-07-19 DIAGNOSIS — F0281 Dementia in other diseases classified elsewhere with behavioral disturbance: Secondary | ICD-10-CM | POA: Diagnosis not present

## 2019-07-19 DIAGNOSIS — G301 Alzheimer's disease with late onset: Secondary | ICD-10-CM | POA: Diagnosis not present

## 2019-07-19 DIAGNOSIS — I2694 Multiple subsegmental pulmonary emboli without acute cor pulmonale: Secondary | ICD-10-CM | POA: Diagnosis not present

## 2019-07-19 DIAGNOSIS — I130 Hypertensive heart and chronic kidney disease with heart failure and stage 1 through stage 4 chronic kidney disease, or unspecified chronic kidney disease: Secondary | ICD-10-CM | POA: Diagnosis not present

## 2019-07-19 DIAGNOSIS — N1832 Chronic kidney disease, stage 3b: Secondary | ICD-10-CM | POA: Diagnosis not present

## 2019-07-19 NOTE — Telephone Encounter (Signed)
[facility] is suppose to be coming today to do an assessment.   He is back home, after being diagnosed with covid,  so no one has been willing to come inside the house knowing he has covid.    Wound is on his right lower buttock, it was bleeding a little bit while she will cleaning him. It wasn't there yesterday. Its about the size of a quarter. The first time she touched it bothered him but not since.

## 2019-07-19 NOTE — Telephone Encounter (Signed)
I recommend she try to have him change positions as often as possible (recommendation is every 2 hours) to offload pressure from the area.  I realize this is challenging as she works from home.  I also suggest she start him on boost supplement shakes if he's able to drink them, one daily, to increase his protein intake.  For the wound itself, keep him clean and dry, apply a barrier cream such as endit cream to the skin on his bottom.  Apply a duoderm of similar cushioned dressing just a bit larger than the open area and change it every 3 days or as needed for soiling.

## 2019-07-19 NOTE — Telephone Encounter (Signed)
Where exactly is the bedsore? Is the skin open?  How large is it?  Is it painful for him?  I need that information to tell her what to do. Ideally, we should see what it looks like.  Home health could certainly help manage a wound like this, too.

## 2019-07-19 NOTE — Telephone Encounter (Signed)
The catheter can probably be arranged through [facility] when they come out.  CMA, please call [facility] and find out if we can fax the order to them?  I do want [name] to be aware that using catheters does increase is risk for urinary tract infections which is why I didn't initially recommend it.

## 2019-07-19 NOTE — Telephone Encounter (Signed)
Patient daughter, [name] called and stated that she spoke with the [employer] and they told her that if the patient had a Rx for a Catheter with Cup they would cover it. Daughter stated that she needs this to keep him dry since he has a bedsore. Please Advise.

## 2019-07-19 NOTE — Telephone Encounter (Signed)
Patient's daughter states the patient has developed a bed sore on his bottom and she doesn't know how to treat it. She is having a hard time keeping him off that spot. His Blood pressure 100/55 P 88 O2 91

## 2019-07-20 ENCOUNTER — Other Ambulatory Visit: Payer: Self-pay | Admitting: Internal Medicine

## 2019-07-20 DIAGNOSIS — L89302 Pressure ulcer of unspecified buttock, stage 2: Secondary | ICD-10-CM

## 2019-07-20 MED ORDER — DUODERM CGF BORDER EX MISC: 1.0000 | CUTANEOUS | 11 refills | Status: DC

## 2019-07-20 NOTE — Telephone Encounter (Signed)
Patients daughter called to check the status of message and states she needs this handled ASA. Mary expressed her dissatisfaction with the care she receives at [facility] and states she is looking for her father a new doctor. [name] indicates Dr.Reed is patients primary care yet she has no idea what she looks like, never heard her voice before and feels that she is too busy to provide care for her father.  I apologized to [facility] that she feels that way yet the facts are Dr.Reed cares for all her patients and was currently seeing patients in office and will address this message as soon as she can.   [name] states she is going to call [facility] herself to see what she can get accomplished without [place].

## 2019-07-20 NOTE — Telephone Encounter (Signed)
Chiropractor with [facility] called back and states patient's daughter [name]) has called them several times and would now like to change orders for foley catheter to a condom catheter.   Please advise.

## 2019-07-20 NOTE — Telephone Encounter (Signed)
Called [facility] and spoke with Jenny Reichmann (Midwife) she stated the sore on patient is Stage 1 and she can take verbal orders for catheter.   She would like to know if provider would like #16 w 5 - 10 ml blue changed monthly as needed. She states a detailed message can be left on voicemail if she is not available when called back.   Routing to provider to advise.

## 2019-07-20 NOTE — Telephone Encounter (Signed)
Also called [name] to discuss provider's response from yesterday concerning bed sores, recommendations on barrier cream and dressing. In looking in chart, did not see if patient had been notified yet so went ahead and notified her of providers response.  Daughter would like to know if we could please provide a prescription for Endit cream and duoderm or if we could recommend where to get these products such as a medical supply store. Daughter also would like to know if Vaseline could be used. State she was told by [facility] to use Vaseline.   Routing to provider to please advise.

## 2019-07-20 NOTE — Telephone Encounter (Signed)
That catheter order is fine to be sent/called [facility].  Have all of the other extensive instructions I provided yesterday before 2pm been relayed to his daughter, [name]?

## 2019-07-20 NOTE — Telephone Encounter (Signed)
Send duoderms to [place], but they will take days to get so buying a box or two locally may be necessary.   [facility] should have them or the pharmacist there may be able to suggest a similar product.  Similarly, the pharmacists may have an alternative suggestion for a barrier cream to be used.  There are many different brands.  It's basically like a diaper cream like desitin

## 2019-07-20 NOTE — Telephone Encounter (Signed)
Patient's daughter called again and discussed the need to find out when her dad could get on list for vaccine. Give provider's response to her question. She verbalized understanding and denied further questions.

## 2019-07-20 NOTE — Telephone Encounter (Signed)
She is having a hard time finding the endit cream is there somewhere we can suggest. The bandages start at $6 per bandage. She wonder if [employer] would cover or could get a script. She will also check at [facility].

## 2019-07-20 NOTE — Telephone Encounter (Signed)
He should be immune for about 3 months from having the illness.  He falls into the older adult with chronic diseases category so his vaccine will be given after long-term care patients and health care workers have been vaccinated.  It will probably be in a couple of months.  When we know exactly how the process will work, we will notify our patients.

## 2019-07-20 NOTE — Telephone Encounter (Signed)
Patient's daughter called and states she would like to know when patient could get COVID vaccine and what place in line was he on the list? She states patient recently tested positive for Covid and would like to get vaccine.  Routing to provider. Please advise.

## 2019-07-24 ENCOUNTER — Other Ambulatory Visit: Payer: Self-pay

## 2019-07-24 NOTE — Patient Outreach (Signed)
[name]) Care Management  [name]  07/24/2019   [name] Dec 05, 1921 [id number]  Subjective: Telephone call to daughter [name] for follow up. She reports that her Dad is doing good.  She reports no fever or worsening COVID symptoms. She states that PT has been out for an evaluation but no further visits at this time.  She states that patient has a couple of bed sores to his bottom and she is dressing those and trying to keep patient turned as much as possible but patient does turn himself.  She is still working on getting him into the PACE program but due to COVID status that is on hold.  She is also trying to work on his VA benefits which is a lengthy process.  Discussed patient care and COVID.  She voices no concerns or needs.    Objective:   Encounter Medications:  Outpatient Encounter Medications as of 07/24/2019  Medication Sig  . albuterol (VENTOLIN HFA) 108 (90 Base) MCG/ACT inhaler Inhale 2 puffs into the lungs every 4 (four) hours as needed for wheezing or shortness of breath. INHALE TWO PUFFS EVERY 4-6 HOURS AS NEEDED FOR DIFFICULTY BREATHING,WHEEZING OR COUGH  . Alcohol Swabs (B-D SINGLE USE SWABS REGULAR) PADS USE AS DIRECTED WITH CHECKING BLOOD SUGAR THREE TIMES DAILY  . amLODipine (NORVASC) 5 MG tablet Take 1 tablet (5 mg total) by mouth daily.  [name] apixaban (ELIQUIS) 5 MG TABS tablet Take 5 mg by mouth 2 (two) times daily.  [name] aspirin EC 81 MG tablet Take 1 tablet (81 mg total) by mouth daily.  . Blood Glucose Calibration (ACCU-CHEK AVIVA) SOLN Use as Directed. Dx: E11.51  . Control Gel Formula Dressing (DUODERM CGF BORDER) MISC Apply 1 each topically every 3 (three) days.  [name] escitalopram (LEXAPRO) 5 MG tablet Take 1 tablet (5 mg total) by mouth daily.  [name] gabapentin (NEURONTIN) 100 MG capsule Take 100 mg by mouth at bedtime.  [name] glucose blood (ACCU-CHEK AVIVA) test strip Use to test blood sugar three times daily. Dx: E11.51  . hydrochlorothiazide  (HYDRODIURIL) 25 MG tablet Take 1 tablet (25 mg total) by mouth daily.  . insulin glargine (LANTUS) 100 UNIT/ML injection Inject 7 Units into the skin daily.  . Insulin Pen Needle (B-D UF III MINI PEN NEEDLES) 31G X 5 MM MISC USE AS DIRECTED.  Elmore Guise Devices (ACCU-CHEK SOFTCLIX) lancets Use as instructed with checking blood sugar three times daily. Dx: E11.51  . Loratadine 10 MG CAPS Take 1 capsule (10 mg total) by mouth daily.  [name] losartan (COZAAR) 50 MG tablet Take 1 tablet (50 mg total) by mouth daily.  . Memantine HCl-Donepezil HCl (NAMZARIC) 28-10 MG CP24 Take 1 capsule by mouth daily.  . Multiple Vitamins-Minerals (SENTRY SENIOR) TABS Take 1 tablet by mouth daily.  . simvastatin (ZOCOR) 20 MG tablet Take 1 tablet (20 mg total) by mouth daily.  . tamsulosin (FLOMAX) 0.4 MG CAPS capsule Take 1 capsule (0.4 mg total) by mouth daily after supper.   No facility-administered encounter medications on file as of 07/24/2019.    Functional Status:  In your present state of health, do you have any difficulty performing the following activities: 07/16/2019 07/06/2019  Hearing? N Y  Vision? N N  Difficulty concentrating or making decisions? Tempie [name]  Comment Patient needs assistance with all aspects of care. -  Walking or climbing stairs? Y Y  Dressing or bathing? Y Y  Doing errands, shopping? Tempie [name]  Preparing Food  and eating ? Y -  Comment needs help with all aspects of care. -  Using the Toilet? Y -  In the past six months, have you accidently leaked urine? Y -  Do you have problems with loss of bowel control? Y -  Managing your Medications? Y -  Managing your Finances? Y -  Housekeeping or managing your Housekeeping? Y -  Some recent data might be hidden    Fall/Depression Screening: Fall Risk  07/18/2019 07/16/2019 06/07/2019  Falls in the past year? 0 1 1  Number falls in past yr: 0 1 1  Injury with Fall? 0 0 1  Comment - - -  Risk Factor Category  - - -  Risk for fall due to : -  History of fall(s) -  Follow up - Falls prevention discussed -   PHQ 2/9 Scores 07/16/2019 07/16/2019 01/22/2019 12/01/2018 04/26/2018 07/05/2017 12/29/2016  PHQ - 2 Score - - 0 0 0 0 0  Exception Documentation Other- indicate reason in comment box Other- indicate reason in comment box - - - - -  Not completed - unable to assess - - - - -    Assessment: Patient continues to recover from [place].    Plan:  [name] CM Care Plan Problem One     Most Recent Value  Care Plan Problem One  Recent Hospitalization COVID  Role Documenting the Problem One  Care Management Telephonic Coordinator  Care Plan for Problem One  Active  [facility] Long Term Goal   Patient will not experience unplanned hospital readmission within the next 31 days  [facility] Long Term Goal Start Date  07/16/19  Interventions for Problem One Long Term Goal  Patient with no fever or worsening COVID symptoms.  Daughter taking care of patient such as feeding and bathing.    [facility] CM Short Term Goal #1   Patient will have home health initial visit within the next 7 days  [facility] CM Short Term Goal #1 Start Date  07/16/19  [name] CM Short Term Goal #1 Met Date  07/24/19  [name] CM Short Term Goal #2   Patient will have follow up with PCP scheduled within 21 days.  [facility] CM Short Term Goal #2 Start Date  07/16/19  [facility] CM Short Term Goal #2 Met Date  07/24/19     RN CM will contact caregiver next month and she is agreeable.    [name] Baseman, RN, MSN [place] [facility] Care Management Coordinator Direct Line [phone] Cell [phone] Toll Free: [phone]  Fax: [phone]

## 2019-08-01 DIAGNOSIS — I2694 Multiple subsegmental pulmonary emboli without acute cor pulmonale: Secondary | ICD-10-CM | POA: Diagnosis not present

## 2019-08-01 DIAGNOSIS — N1832 Chronic kidney disease, stage 3b: Secondary | ICD-10-CM | POA: Diagnosis not present

## 2019-08-01 DIAGNOSIS — F0281 Dementia in other diseases classified elsewhere with behavioral disturbance: Secondary | ICD-10-CM | POA: Diagnosis not present

## 2019-08-01 DIAGNOSIS — E1122 Type 2 diabetes mellitus with diabetic chronic kidney disease: Secondary | ICD-10-CM | POA: Diagnosis not present

## 2019-08-01 DIAGNOSIS — I82403 Acute embolism and thrombosis of unspecified deep veins of lower extremity, bilateral: Secondary | ICD-10-CM | POA: Diagnosis not present

## 2019-08-01 DIAGNOSIS — G301 Alzheimer's disease with late onset: Secondary | ICD-10-CM | POA: Diagnosis not present

## 2019-08-01 DIAGNOSIS — I5032 Chronic diastolic (congestive) heart failure: Secondary | ICD-10-CM | POA: Diagnosis not present

## 2019-08-01 DIAGNOSIS — I130 Hypertensive heart and chronic kidney disease with heart failure and stage 1 through stage 4 chronic kidney disease, or unspecified chronic kidney disease: Secondary | ICD-10-CM | POA: Diagnosis not present

## 2019-08-01 DIAGNOSIS — D631 Anemia in chronic kidney disease: Secondary | ICD-10-CM | POA: Diagnosis not present

## 2019-08-03 DIAGNOSIS — I5032 Chronic diastolic (congestive) heart failure: Secondary | ICD-10-CM | POA: Diagnosis not present

## 2019-08-03 DIAGNOSIS — J1289 Other viral pneumonia: Secondary | ICD-10-CM | POA: Diagnosis not present

## 2019-08-03 DIAGNOSIS — F0281 Dementia in other diseases classified elsewhere with behavioral disturbance: Secondary | ICD-10-CM | POA: Diagnosis not present

## 2019-08-03 DIAGNOSIS — I130 Hypertensive heart and chronic kidney disease with heart failure and stage 1 through stage 4 chronic kidney disease, or unspecified chronic kidney disease: Secondary | ICD-10-CM | POA: Diagnosis not present

## 2019-08-03 DIAGNOSIS — E1122 Type 2 diabetes mellitus with diabetic chronic kidney disease: Secondary | ICD-10-CM | POA: Diagnosis not present

## 2019-08-03 DIAGNOSIS — I82403 Acute embolism and thrombosis of unspecified deep veins of lower extremity, bilateral: Secondary | ICD-10-CM | POA: Diagnosis not present

## 2019-08-03 DIAGNOSIS — U071 COVID-19: Secondary | ICD-10-CM | POA: Diagnosis not present

## 2019-08-03 DIAGNOSIS — I2694 Multiple subsegmental pulmonary emboli without acute cor pulmonale: Secondary | ICD-10-CM | POA: Diagnosis not present

## 2019-08-03 DIAGNOSIS — G301 Alzheimer's disease with late onset: Secondary | ICD-10-CM | POA: Diagnosis not present

## 2019-08-04 DIAGNOSIS — I2694 Multiple subsegmental pulmonary emboli without acute cor pulmonale: Secondary | ICD-10-CM | POA: Diagnosis not present

## 2019-08-04 DIAGNOSIS — G301 Alzheimer's disease with late onset: Secondary | ICD-10-CM | POA: Diagnosis not present

## 2019-08-04 DIAGNOSIS — F0281 Dementia in other diseases classified elsewhere with behavioral disturbance: Secondary | ICD-10-CM | POA: Diagnosis not present

## 2019-08-04 DIAGNOSIS — I5032 Chronic diastolic (congestive) heart failure: Secondary | ICD-10-CM | POA: Diagnosis not present

## 2019-08-04 DIAGNOSIS — I82403 Acute embolism and thrombosis of unspecified deep veins of lower extremity, bilateral: Secondary | ICD-10-CM | POA: Diagnosis not present

## 2019-08-04 DIAGNOSIS — E1122 Type 2 diabetes mellitus with diabetic chronic kidney disease: Secondary | ICD-10-CM | POA: Diagnosis not present

## 2019-08-04 DIAGNOSIS — I130 Hypertensive heart and chronic kidney disease with heart failure and stage 1 through stage 4 chronic kidney disease, or unspecified chronic kidney disease: Secondary | ICD-10-CM | POA: Diagnosis not present

## 2019-08-04 DIAGNOSIS — U071 COVID-19: Secondary | ICD-10-CM | POA: Diagnosis not present

## 2019-08-04 DIAGNOSIS — J1289 Other viral pneumonia: Secondary | ICD-10-CM | POA: Diagnosis not present

## 2019-08-05 DIAGNOSIS — R2689 Other abnormalities of gait and mobility: Secondary | ICD-10-CM | POA: Diagnosis not present

## 2019-08-06 DIAGNOSIS — I82403 Acute embolism and thrombosis of unspecified deep veins of lower extremity, bilateral: Secondary | ICD-10-CM | POA: Diagnosis not present

## 2019-08-06 DIAGNOSIS — J1289 Other viral pneumonia: Secondary | ICD-10-CM | POA: Diagnosis not present

## 2019-08-06 DIAGNOSIS — G301 Alzheimer's disease with late onset: Secondary | ICD-10-CM | POA: Diagnosis not present

## 2019-08-06 DIAGNOSIS — I2694 Multiple subsegmental pulmonary emboli without acute cor pulmonale: Secondary | ICD-10-CM | POA: Diagnosis not present

## 2019-08-06 DIAGNOSIS — F0281 Dementia in other diseases classified elsewhere with behavioral disturbance: Secondary | ICD-10-CM | POA: Diagnosis not present

## 2019-08-06 DIAGNOSIS — U071 COVID-19: Secondary | ICD-10-CM | POA: Diagnosis not present

## 2019-08-06 DIAGNOSIS — E1122 Type 2 diabetes mellitus with diabetic chronic kidney disease: Secondary | ICD-10-CM | POA: Diagnosis not present

## 2019-08-06 DIAGNOSIS — I5032 Chronic diastolic (congestive) heart failure: Secondary | ICD-10-CM | POA: Diagnosis not present

## 2019-08-06 DIAGNOSIS — I130 Hypertensive heart and chronic kidney disease with heart failure and stage 1 through stage 4 chronic kidney disease, or unspecified chronic kidney disease: Secondary | ICD-10-CM | POA: Diagnosis not present

## 2019-08-09 ENCOUNTER — Other Ambulatory Visit: Payer: Self-pay

## 2019-08-09 DIAGNOSIS — I2694 Multiple subsegmental pulmonary emboli without acute cor pulmonale: Secondary | ICD-10-CM | POA: Diagnosis not present

## 2019-08-09 DIAGNOSIS — I130 Hypertensive heart and chronic kidney disease with heart failure and stage 1 through stage 4 chronic kidney disease, or unspecified chronic kidney disease: Secondary | ICD-10-CM | POA: Diagnosis not present

## 2019-08-09 DIAGNOSIS — U071 COVID-19: Secondary | ICD-10-CM | POA: Diagnosis not present

## 2019-08-09 DIAGNOSIS — F0281 Dementia in other diseases classified elsewhere with behavioral disturbance: Secondary | ICD-10-CM | POA: Diagnosis not present

## 2019-08-09 DIAGNOSIS — I5032 Chronic diastolic (congestive) heart failure: Secondary | ICD-10-CM | POA: Diagnosis not present

## 2019-08-09 DIAGNOSIS — J1289 Other viral pneumonia: Secondary | ICD-10-CM | POA: Diagnosis not present

## 2019-08-09 DIAGNOSIS — E1122 Type 2 diabetes mellitus with diabetic chronic kidney disease: Secondary | ICD-10-CM | POA: Diagnosis not present

## 2019-08-09 DIAGNOSIS — G301 Alzheimer's disease with late onset: Secondary | ICD-10-CM | POA: Diagnosis not present

## 2019-08-09 DIAGNOSIS — I82403 Acute embolism and thrombosis of unspecified deep veins of lower extremity, bilateral: Secondary | ICD-10-CM | POA: Diagnosis not present

## 2019-08-09 NOTE — Patient Outreach (Signed)
[name]) Care Management  [place]  08/09/2019   [name] 04/19/1922 [id number]  Subjective: Telephone call to daughter [name].  She reports that patient is doing much better.  She states that his area to his bottom is almost healed.  She states patient even attempting to get out of bed. She states that therapy is working with patient and that patient stood for seconds and then sat down but is proud of his progress.  Discussed dementia and disease process.  Also discussed COVID-19 with daughter.  She verbalized understanding and denies any needs.     Objective:   Encounter Medications:  Outpatient Encounter Medications as of 08/09/2019  Medication Sig  . albuterol (VENTOLIN HFA) 108 (90 Base) MCG/ACT inhaler Inhale 2 puffs into the lungs every 4 (four) hours as needed for wheezing or shortness of breath. INHALE TWO PUFFS EVERY 4-6 HOURS AS NEEDED FOR DIFFICULTY BREATHING,WHEEZING OR COUGH  . Alcohol Swabs (B-D SINGLE USE SWABS REGULAR) PADS USE AS DIRECTED WITH CHECKING BLOOD SUGAR THREE TIMES DAILY  . amLODipine (NORVASC) 5 MG tablet Take 1 tablet (5 mg total) by mouth daily.  [name] apixaban (ELIQUIS) 5 MG TABS tablet Take 5 mg by mouth 2 (two) times daily.  [name] aspirin EC 81 MG tablet Take 1 tablet (81 mg total) by mouth daily.  . Blood Glucose Calibration (ACCU-CHEK AVIVA) SOLN Use as Directed. Dx: E11.51  . Control Gel Formula Dressing (DUODERM CGF BORDER) MISC Apply 1 each topically every 3 (three) days.  [name] escitalopram (LEXAPRO) 5 MG tablet Take 1 tablet (5 mg total) by mouth daily.  [name] gabapentin (NEURONTIN) 100 MG capsule Take 100 mg by mouth at bedtime.  [name] glucose blood (ACCU-CHEK AVIVA) test strip Use to test blood sugar three times daily. Dx: E11.51  . hydrochlorothiazide (HYDRODIURIL) 25 MG tablet Take 1 tablet (25 mg total) by mouth daily.  . insulin glargine (LANTUS) 100 UNIT/ML injection Inject 7 Units into the skin daily.  . Insulin Pen Needle (B-D UF  III MINI PEN NEEDLES) 31G X 5 MM MISC USE AS DIRECTED.  Elmore Guise Devices (ACCU-CHEK SOFTCLIX) lancets Use as instructed with checking blood sugar three times daily. Dx: E11.51  . Loratadine 10 MG CAPS Take 1 capsule (10 mg total) by mouth daily.  [name] losartan (COZAAR) 50 MG tablet Take 1 tablet (50 mg total) by mouth daily.  . Memantine HCl-Donepezil HCl (NAMZARIC) 28-10 MG CP24 Take 1 capsule by mouth daily.  . Multiple Vitamins-Minerals (SENTRY SENIOR) TABS Take 1 tablet by mouth daily.  . simvastatin (ZOCOR) 20 MG tablet Take 1 tablet (20 mg total) by mouth daily.  . tamsulosin (FLOMAX) 0.4 MG CAPS capsule Take 1 capsule (0.4 mg total) by mouth daily after supper.   No facility-administered encounter medications on file as of 08/09/2019.    Functional Status:  In your present state of health, do you have any difficulty performing the following activities: 07/16/2019 07/06/2019  Hearing? N Y  Vision? N N  Difficulty concentrating or making decisions? [name]  Comment Patient needs assistance with all aspects of care. -  Walking or climbing stairs? Y Y  Dressing or bathing? Y Y  Doing errands, shopping? [name]  Preparing Food and eating ? Y -  Comment needs help with all aspects of care. -  Using the Toilet? Y -  In the past six months, have you accidently leaked urine? Y -  Do you have problems with loss of bowel control?  Y -  Managing your Medications? Y -  Managing your Finances? Y -  Housekeeping or managing your Housekeeping? Y -  Some recent data might be hidden    Fall/Depression Screening: Fall Risk  07/18/2019 07/16/2019 06/07/2019  Falls in the past year? 0 1 1  Number falls in past yr: 0 1 1  Injury with Fall? 0 0 1  Comment - - -  Risk Factor Category  - - -  Risk for fall due to : - History of fall(s) -  Follow up - Falls prevention discussed -   PHQ 2/9 Scores 07/16/2019 07/16/2019 01/22/2019 12/01/2018 04/26/2018 07/05/2017 12/29/2016  PHQ - 2 Score - - 0 0 0 0 0  Exception  Documentation Other- indicate reason in comment box Other- indicate reason in comment box - - - - -  Not completed - unable to assess - - - - -    Assessment: Patient continues to improve with periods of extreme confusion per daughter.  Plan:  [facility] CM Care Plan Problem One     Most Recent Value  Care Plan Problem One  Recent Hospitalization COVID  Role Documenting the Problem One  Care Management Telephonic Coordinator  Care Plan for Problem One  Active  [facility] Long Term Goal   Patient will not experience unplanned hospital readmission within the next 31 days  [facility] Long Term Goal Start Date  07/16/19  Interventions for Problem One Long Term Goal  Discussed patient overall status.  Discussed COVID and dementia.  Daughter continues to orient patient and support patient.       RN CM will contact in the month of February and daughter agreeable.    Jone Baseman, RN, MSN [place] Management Care Management Coordinator Direct Line [phone] Cell [phone] Toll Free: [phone]  Fax: [phone]

## 2019-08-10 DIAGNOSIS — E1122 Type 2 diabetes mellitus with diabetic chronic kidney disease: Secondary | ICD-10-CM | POA: Diagnosis not present

## 2019-08-10 DIAGNOSIS — I130 Hypertensive heart and chronic kidney disease with heart failure and stage 1 through stage 4 chronic kidney disease, or unspecified chronic kidney disease: Secondary | ICD-10-CM | POA: Diagnosis not present

## 2019-08-10 DIAGNOSIS — U071 COVID-19: Secondary | ICD-10-CM | POA: Diagnosis not present

## 2019-08-10 DIAGNOSIS — I2694 Multiple subsegmental pulmonary emboli without acute cor pulmonale: Secondary | ICD-10-CM | POA: Diagnosis not present

## 2019-08-10 DIAGNOSIS — I5032 Chronic diastolic (congestive) heart failure: Secondary | ICD-10-CM | POA: Diagnosis not present

## 2019-08-10 DIAGNOSIS — G301 Alzheimer's disease with late onset: Secondary | ICD-10-CM | POA: Diagnosis not present

## 2019-08-10 DIAGNOSIS — J1289 Other viral pneumonia: Secondary | ICD-10-CM | POA: Diagnosis not present

## 2019-08-10 DIAGNOSIS — F0281 Dementia in other diseases classified elsewhere with behavioral disturbance: Secondary | ICD-10-CM | POA: Diagnosis not present

## 2019-08-10 DIAGNOSIS — I82403 Acute embolism and thrombosis of unspecified deep veins of lower extremity, bilateral: Secondary | ICD-10-CM | POA: Diagnosis not present

## 2019-08-13 ENCOUNTER — Ambulatory Visit: Payer: Self-pay

## 2019-08-13 DIAGNOSIS — I130 Hypertensive heart and chronic kidney disease with heart failure and stage 1 through stage 4 chronic kidney disease, or unspecified chronic kidney disease: Secondary | ICD-10-CM | POA: Diagnosis not present

## 2019-08-13 DIAGNOSIS — I5032 Chronic diastolic (congestive) heart failure: Secondary | ICD-10-CM | POA: Diagnosis not present

## 2019-08-13 DIAGNOSIS — G301 Alzheimer's disease with late onset: Secondary | ICD-10-CM | POA: Diagnosis not present

## 2019-08-13 DIAGNOSIS — E1122 Type 2 diabetes mellitus with diabetic chronic kidney disease: Secondary | ICD-10-CM | POA: Diagnosis not present

## 2019-08-13 DIAGNOSIS — U071 COVID-19: Secondary | ICD-10-CM | POA: Diagnosis not present

## 2019-08-13 DIAGNOSIS — J1289 Other viral pneumonia: Secondary | ICD-10-CM | POA: Diagnosis not present

## 2019-08-13 DIAGNOSIS — I82403 Acute embolism and thrombosis of unspecified deep veins of lower extremity, bilateral: Secondary | ICD-10-CM | POA: Diagnosis not present

## 2019-08-13 DIAGNOSIS — I2694 Multiple subsegmental pulmonary emboli without acute cor pulmonale: Secondary | ICD-10-CM | POA: Diagnosis not present

## 2019-08-13 DIAGNOSIS — F0281 Dementia in other diseases classified elsewhere with behavioral disturbance: Secondary | ICD-10-CM | POA: Diagnosis not present

## 2019-08-14 DIAGNOSIS — I5032 Chronic diastolic (congestive) heart failure: Secondary | ICD-10-CM | POA: Diagnosis not present

## 2019-08-14 DIAGNOSIS — J1289 Other viral pneumonia: Secondary | ICD-10-CM | POA: Diagnosis not present

## 2019-08-14 DIAGNOSIS — I82403 Acute embolism and thrombosis of unspecified deep veins of lower extremity, bilateral: Secondary | ICD-10-CM | POA: Diagnosis not present

## 2019-08-14 DIAGNOSIS — I2694 Multiple subsegmental pulmonary emboli without acute cor pulmonale: Secondary | ICD-10-CM | POA: Diagnosis not present

## 2019-08-14 DIAGNOSIS — I130 Hypertensive heart and chronic kidney disease with heart failure and stage 1 through stage 4 chronic kidney disease, or unspecified chronic kidney disease: Secondary | ICD-10-CM | POA: Diagnosis not present

## 2019-08-14 DIAGNOSIS — E1122 Type 2 diabetes mellitus with diabetic chronic kidney disease: Secondary | ICD-10-CM | POA: Diagnosis not present

## 2019-08-14 DIAGNOSIS — F0281 Dementia in other diseases classified elsewhere with behavioral disturbance: Secondary | ICD-10-CM | POA: Diagnosis not present

## 2019-08-14 DIAGNOSIS — G301 Alzheimer's disease with late onset: Secondary | ICD-10-CM | POA: Diagnosis not present

## 2019-08-14 DIAGNOSIS — U071 COVID-19: Secondary | ICD-10-CM | POA: Diagnosis not present

## 2019-08-16 DIAGNOSIS — J1289 Other viral pneumonia: Secondary | ICD-10-CM | POA: Diagnosis not present

## 2019-08-16 DIAGNOSIS — G301 Alzheimer's disease with late onset: Secondary | ICD-10-CM | POA: Diagnosis not present

## 2019-08-16 DIAGNOSIS — I2694 Multiple subsegmental pulmonary emboli without acute cor pulmonale: Secondary | ICD-10-CM | POA: Diagnosis not present

## 2019-08-16 DIAGNOSIS — I130 Hypertensive heart and chronic kidney disease with heart failure and stage 1 through stage 4 chronic kidney disease, or unspecified chronic kidney disease: Secondary | ICD-10-CM | POA: Diagnosis not present

## 2019-08-16 DIAGNOSIS — I82403 Acute embolism and thrombosis of unspecified deep veins of lower extremity, bilateral: Secondary | ICD-10-CM | POA: Diagnosis not present

## 2019-08-16 DIAGNOSIS — I5032 Chronic diastolic (congestive) heart failure: Secondary | ICD-10-CM | POA: Diagnosis not present

## 2019-08-16 DIAGNOSIS — F0281 Dementia in other diseases classified elsewhere with behavioral disturbance: Secondary | ICD-10-CM | POA: Diagnosis not present

## 2019-08-16 DIAGNOSIS — E1122 Type 2 diabetes mellitus with diabetic chronic kidney disease: Secondary | ICD-10-CM | POA: Diagnosis not present

## 2019-08-16 DIAGNOSIS — U071 COVID-19: Secondary | ICD-10-CM | POA: Diagnosis not present

## 2019-08-17 DIAGNOSIS — U071 COVID-19: Secondary | ICD-10-CM | POA: Diagnosis not present

## 2019-08-17 DIAGNOSIS — I2694 Multiple subsegmental pulmonary emboli without acute cor pulmonale: Secondary | ICD-10-CM | POA: Diagnosis not present

## 2019-08-17 DIAGNOSIS — I82403 Acute embolism and thrombosis of unspecified deep veins of lower extremity, bilateral: Secondary | ICD-10-CM | POA: Diagnosis not present

## 2019-08-17 DIAGNOSIS — I130 Hypertensive heart and chronic kidney disease with heart failure and stage 1 through stage 4 chronic kidney disease, or unspecified chronic kidney disease: Secondary | ICD-10-CM | POA: Diagnosis not present

## 2019-08-17 DIAGNOSIS — G301 Alzheimer's disease with late onset: Secondary | ICD-10-CM | POA: Diagnosis not present

## 2019-08-17 DIAGNOSIS — E1122 Type 2 diabetes mellitus with diabetic chronic kidney disease: Secondary | ICD-10-CM | POA: Diagnosis not present

## 2019-08-17 DIAGNOSIS — F0281 Dementia in other diseases classified elsewhere with behavioral disturbance: Secondary | ICD-10-CM | POA: Diagnosis not present

## 2019-08-17 DIAGNOSIS — J1289 Other viral pneumonia: Secondary | ICD-10-CM | POA: Diagnosis not present

## 2019-08-17 DIAGNOSIS — I5032 Chronic diastolic (congestive) heart failure: Secondary | ICD-10-CM | POA: Diagnosis not present

## 2019-08-18 DIAGNOSIS — E1122 Type 2 diabetes mellitus with diabetic chronic kidney disease: Secondary | ICD-10-CM | POA: Diagnosis not present

## 2019-08-18 DIAGNOSIS — I5032 Chronic diastolic (congestive) heart failure: Secondary | ICD-10-CM | POA: Diagnosis not present

## 2019-08-18 DIAGNOSIS — F0281 Dementia in other diseases classified elsewhere with behavioral disturbance: Secondary | ICD-10-CM | POA: Diagnosis not present

## 2019-08-18 DIAGNOSIS — I130 Hypertensive heart and chronic kidney disease with heart failure and stage 1 through stage 4 chronic kidney disease, or unspecified chronic kidney disease: Secondary | ICD-10-CM | POA: Diagnosis not present

## 2019-08-18 DIAGNOSIS — I2694 Multiple subsegmental pulmonary emboli without acute cor pulmonale: Secondary | ICD-10-CM | POA: Diagnosis not present

## 2019-08-18 DIAGNOSIS — J1289 Other viral pneumonia: Secondary | ICD-10-CM | POA: Diagnosis not present

## 2019-08-18 DIAGNOSIS — G301 Alzheimer's disease with late onset: Secondary | ICD-10-CM | POA: Diagnosis not present

## 2019-08-18 DIAGNOSIS — I82403 Acute embolism and thrombosis of unspecified deep veins of lower extremity, bilateral: Secondary | ICD-10-CM | POA: Diagnosis not present

## 2019-08-18 DIAGNOSIS — U071 COVID-19: Secondary | ICD-10-CM | POA: Diagnosis not present

## 2019-08-20 DIAGNOSIS — G301 Alzheimer's disease with late onset: Secondary | ICD-10-CM | POA: Diagnosis not present

## 2019-08-20 DIAGNOSIS — I82403 Acute embolism and thrombosis of unspecified deep veins of lower extremity, bilateral: Secondary | ICD-10-CM | POA: Diagnosis not present

## 2019-08-20 DIAGNOSIS — J1289 Other viral pneumonia: Secondary | ICD-10-CM | POA: Diagnosis not present

## 2019-08-20 DIAGNOSIS — U071 COVID-19: Secondary | ICD-10-CM | POA: Diagnosis not present

## 2019-08-20 DIAGNOSIS — I5032 Chronic diastolic (congestive) heart failure: Secondary | ICD-10-CM | POA: Diagnosis not present

## 2019-08-20 DIAGNOSIS — F0281 Dementia in other diseases classified elsewhere with behavioral disturbance: Secondary | ICD-10-CM | POA: Diagnosis not present

## 2019-08-20 DIAGNOSIS — I2694 Multiple subsegmental pulmonary emboli without acute cor pulmonale: Secondary | ICD-10-CM | POA: Diagnosis not present

## 2019-08-20 DIAGNOSIS — E1122 Type 2 diabetes mellitus with diabetic chronic kidney disease: Secondary | ICD-10-CM | POA: Diagnosis not present

## 2019-08-20 DIAGNOSIS — I130 Hypertensive heart and chronic kidney disease with heart failure and stage 1 through stage 4 chronic kidney disease, or unspecified chronic kidney disease: Secondary | ICD-10-CM | POA: Diagnosis not present

## 2019-08-21 ENCOUNTER — Telehealth: Payer: Self-pay | Admitting: *Deleted

## 2019-08-21 ENCOUNTER — Telehealth: Payer: Self-pay

## 2019-08-21 DIAGNOSIS — I82403 Acute embolism and thrombosis of unspecified deep veins of lower extremity, bilateral: Secondary | ICD-10-CM | POA: Diagnosis not present

## 2019-08-21 DIAGNOSIS — U071 COVID-19: Secondary | ICD-10-CM | POA: Diagnosis not present

## 2019-08-21 DIAGNOSIS — I130 Hypertensive heart and chronic kidney disease with heart failure and stage 1 through stage 4 chronic kidney disease, or unspecified chronic kidney disease: Secondary | ICD-10-CM | POA: Diagnosis not present

## 2019-08-21 DIAGNOSIS — J1289 Other viral pneumonia: Secondary | ICD-10-CM | POA: Diagnosis not present

## 2019-08-21 DIAGNOSIS — L89302 Pressure ulcer of unspecified buttock, stage 2: Secondary | ICD-10-CM

## 2019-08-21 DIAGNOSIS — F03B18 Unspecified dementia, moderate, with other behavioral disturbance: Secondary | ICD-10-CM

## 2019-08-21 DIAGNOSIS — G301 Alzheimer's disease with late onset: Secondary | ICD-10-CM | POA: Diagnosis not present

## 2019-08-21 DIAGNOSIS — E1122 Type 2 diabetes mellitus with diabetic chronic kidney disease: Secondary | ICD-10-CM | POA: Diagnosis not present

## 2019-08-21 DIAGNOSIS — F0391 Unspecified dementia with behavioral disturbance: Secondary | ICD-10-CM

## 2019-08-21 DIAGNOSIS — R5381 Other malaise: Secondary | ICD-10-CM

## 2019-08-21 DIAGNOSIS — I5032 Chronic diastolic (congestive) heart failure: Secondary | ICD-10-CM | POA: Diagnosis not present

## 2019-08-21 DIAGNOSIS — F0281 Dementia in other diseases classified elsewhere with behavioral disturbance: Secondary | ICD-10-CM | POA: Diagnosis not present

## 2019-08-21 DIAGNOSIS — I2694 Multiple subsegmental pulmonary emboli without acute cor pulmonale: Secondary | ICD-10-CM | POA: Diagnosis not present

## 2019-08-21 MED ORDER — ACCU-CHEK AVIVA DEVI: 0 refills | Status: AC

## 2019-08-21 NOTE — Telephone Encounter (Signed)
Noted. Thanks.

## 2019-08-21 NOTE — Telephone Encounter (Signed)
Agree with all of the recommendations. Please find out which glucometer they need and send it with the diagnoses to the pharmacy.  Do they need lancets, test strips?  How long have they been without it? Thanks.

## 2019-08-21 NOTE — Telephone Encounter (Signed)
Talked with  [name], Mr. Hennington sugars have not been checked since sometimes last year, that is when the meter was lost, [name] said it's been a lot going on and she has not had time to find anything therefore she is requesting a new one. If the same machine is ordered she does not need lancets, just the machine.  Meter is ordered

## 2019-08-21 NOTE — Telephone Encounter (Signed)
[name] with [facility] called and stated that she saw patient this weekend and they have requested an order for a  Specialty Mattress Overlay and Heel Protectors due to Lying in bed all day to protect from sores.   Also needs new Glucometer because daughter lost theirs.   Please Advise.  Call [name] [phone] with orders due to [name] is only the weekend nurse.

## 2019-08-21 NOTE — Telephone Encounter (Signed)
Margaret with [facility] called to request verbal orders from Dr.Reed or staff for Medical Social Worker per daughters request.   Per [employer] standing order, verbal order given. Message will be sent to patient's provider as a FYI.

## 2019-08-22 DIAGNOSIS — I82403 Acute embolism and thrombosis of unspecified deep veins of lower extremity, bilateral: Secondary | ICD-10-CM | POA: Diagnosis not present

## 2019-08-22 DIAGNOSIS — J1289 Other viral pneumonia: Secondary | ICD-10-CM | POA: Diagnosis not present

## 2019-08-22 DIAGNOSIS — I2694 Multiple subsegmental pulmonary emboli without acute cor pulmonale: Secondary | ICD-10-CM | POA: Diagnosis not present

## 2019-08-22 DIAGNOSIS — E1122 Type 2 diabetes mellitus with diabetic chronic kidney disease: Secondary | ICD-10-CM | POA: Diagnosis not present

## 2019-08-22 DIAGNOSIS — U071 COVID-19: Secondary | ICD-10-CM | POA: Diagnosis not present

## 2019-08-22 DIAGNOSIS — I130 Hypertensive heart and chronic kidney disease with heart failure and stage 1 through stage 4 chronic kidney disease, or unspecified chronic kidney disease: Secondary | ICD-10-CM | POA: Diagnosis not present

## 2019-08-22 DIAGNOSIS — G301 Alzheimer's disease with late onset: Secondary | ICD-10-CM | POA: Diagnosis not present

## 2019-08-22 DIAGNOSIS — F0281 Dementia in other diseases classified elsewhere with behavioral disturbance: Secondary | ICD-10-CM | POA: Diagnosis not present

## 2019-08-22 DIAGNOSIS — I5032 Chronic diastolic (congestive) heart failure: Secondary | ICD-10-CM | POA: Diagnosis not present

## 2019-08-22 NOTE — Addendum Note (Signed)
Addended by: Rafael Bihari A on: 08/22/2019 08:58 AM   Modules accepted: Orders

## 2019-08-22 NOTE — Telephone Encounter (Addendum)
Spoke with Gevena Cotton with [facility] and she stated to fax the orders to her at Fax: [phone] (Attn: Maggie) and she will send them to the DME Office. Margaret aware the orders will be faxed tomorrow when Dr. Mariea Clonts is in the office to sign. Agreed.   Pended Orders and sent to Dr. Mariea Clonts for approval.

## 2019-08-23 NOTE — Telephone Encounter (Signed)
Orders printed out and signed.  Will return to CMA to be faxed.

## 2019-08-23 NOTE — Telephone Encounter (Signed)
Papers have been faxed to Attn: Maggie @[phone]

## 2019-08-23 NOTE — Addendum Note (Signed)
Addended by: Gayland Curry on: 08/23/2019 09:46 AM   Modules accepted: Orders

## 2019-08-24 ENCOUNTER — Telehealth: Payer: Self-pay | Admitting: *Deleted

## 2019-08-24 DIAGNOSIS — I130 Hypertensive heart and chronic kidney disease with heart failure and stage 1 through stage 4 chronic kidney disease, or unspecified chronic kidney disease: Secondary | ICD-10-CM | POA: Diagnosis not present

## 2019-08-24 DIAGNOSIS — E1122 Type 2 diabetes mellitus with diabetic chronic kidney disease: Secondary | ICD-10-CM | POA: Diagnosis not present

## 2019-08-24 DIAGNOSIS — F0281 Dementia in other diseases classified elsewhere with behavioral disturbance: Secondary | ICD-10-CM | POA: Diagnosis not present

## 2019-08-24 DIAGNOSIS — I82403 Acute embolism and thrombosis of unspecified deep veins of lower extremity, bilateral: Secondary | ICD-10-CM | POA: Diagnosis not present

## 2019-08-24 DIAGNOSIS — U071 COVID-19: Secondary | ICD-10-CM | POA: Diagnosis not present

## 2019-08-24 DIAGNOSIS — I5032 Chronic diastolic (congestive) heart failure: Secondary | ICD-10-CM | POA: Diagnosis not present

## 2019-08-24 DIAGNOSIS — J1289 Other viral pneumonia: Secondary | ICD-10-CM | POA: Diagnosis not present

## 2019-08-24 DIAGNOSIS — I2694 Multiple subsegmental pulmonary emboli without acute cor pulmonale: Secondary | ICD-10-CM | POA: Diagnosis not present

## 2019-08-24 DIAGNOSIS — G301 Alzheimer's disease with late onset: Secondary | ICD-10-CM | POA: Diagnosis not present

## 2019-08-24 NOTE — Telephone Encounter (Signed)
Tried calling Celesta Gentile and Voicemail is full and cannot leave message. Will try again later.

## 2019-08-24 NOTE — Telephone Encounter (Signed)
[facility]. LM on Voicemail.

## 2019-08-24 NOTE — Telephone Encounter (Signed)
1. [name] with [name] called and wants Verbal Orders to Evaluate and Admit if appropriate to Hospice Care. Please Advise.   2. Also wants to know if you will be attending or would like to transfer his care to the [facility] Medical Director. Please Advise.   Please Advise.

## 2019-08-24 NOTE — Telephone Encounter (Signed)
This certainly sounds appropriate if his daughter is on board.  I can be attending.

## 2019-08-27 DIAGNOSIS — F0281 Dementia in other diseases classified elsewhere with behavioral disturbance: Secondary | ICD-10-CM | POA: Diagnosis not present

## 2019-08-27 DIAGNOSIS — E1122 Type 2 diabetes mellitus with diabetic chronic kidney disease: Secondary | ICD-10-CM | POA: Diagnosis not present

## 2019-08-27 DIAGNOSIS — J1289 Other viral pneumonia: Secondary | ICD-10-CM | POA: Diagnosis not present

## 2019-08-27 DIAGNOSIS — G301 Alzheimer's disease with late onset: Secondary | ICD-10-CM | POA: Diagnosis not present

## 2019-08-27 DIAGNOSIS — I2694 Multiple subsegmental pulmonary emboli without acute cor pulmonale: Secondary | ICD-10-CM | POA: Diagnosis not present

## 2019-08-27 DIAGNOSIS — I130 Hypertensive heart and chronic kidney disease with heart failure and stage 1 through stage 4 chronic kidney disease, or unspecified chronic kidney disease: Secondary | ICD-10-CM | POA: Diagnosis not present

## 2019-08-27 DIAGNOSIS — I5032 Chronic diastolic (congestive) heart failure: Secondary | ICD-10-CM | POA: Diagnosis not present

## 2019-08-27 DIAGNOSIS — I82403 Acute embolism and thrombosis of unspecified deep veins of lower extremity, bilateral: Secondary | ICD-10-CM | POA: Diagnosis not present

## 2019-08-27 DIAGNOSIS — U071 COVID-19: Secondary | ICD-10-CM | POA: Diagnosis not present

## 2019-08-29 ENCOUNTER — Ambulatory Visit: Payer: Medicare HMO | Admitting: Podiatry

## 2019-08-29 DIAGNOSIS — I5032 Chronic diastolic (congestive) heart failure: Secondary | ICD-10-CM | POA: Diagnosis not present

## 2019-08-29 DIAGNOSIS — F0281 Dementia in other diseases classified elsewhere with behavioral disturbance: Secondary | ICD-10-CM | POA: Diagnosis not present

## 2019-08-29 DIAGNOSIS — I130 Hypertensive heart and chronic kidney disease with heart failure and stage 1 through stage 4 chronic kidney disease, or unspecified chronic kidney disease: Secondary | ICD-10-CM | POA: Diagnosis not present

## 2019-08-29 DIAGNOSIS — I2694 Multiple subsegmental pulmonary emboli without acute cor pulmonale: Secondary | ICD-10-CM | POA: Diagnosis not present

## 2019-08-29 DIAGNOSIS — E1122 Type 2 diabetes mellitus with diabetic chronic kidney disease: Secondary | ICD-10-CM | POA: Diagnosis not present

## 2019-08-29 DIAGNOSIS — J1289 Other viral pneumonia: Secondary | ICD-10-CM | POA: Diagnosis not present

## 2019-08-29 DIAGNOSIS — G301 Alzheimer's disease with late onset: Secondary | ICD-10-CM | POA: Diagnosis not present

## 2019-08-29 DIAGNOSIS — I82403 Acute embolism and thrombosis of unspecified deep veins of lower extremity, bilateral: Secondary | ICD-10-CM | POA: Diagnosis not present

## 2019-08-29 DIAGNOSIS — U071 COVID-19: Secondary | ICD-10-CM | POA: Diagnosis not present

## 2019-08-31 DIAGNOSIS — I82403 Acute embolism and thrombosis of unspecified deep veins of lower extremity, bilateral: Secondary | ICD-10-CM | POA: Diagnosis not present

## 2019-08-31 DIAGNOSIS — E1122 Type 2 diabetes mellitus with diabetic chronic kidney disease: Secondary | ICD-10-CM | POA: Diagnosis not present

## 2019-08-31 DIAGNOSIS — I2694 Multiple subsegmental pulmonary emboli without acute cor pulmonale: Secondary | ICD-10-CM | POA: Diagnosis not present

## 2019-08-31 DIAGNOSIS — G301 Alzheimer's disease with late onset: Secondary | ICD-10-CM | POA: Diagnosis not present

## 2019-08-31 DIAGNOSIS — I130 Hypertensive heart and chronic kidney disease with heart failure and stage 1 through stage 4 chronic kidney disease, or unspecified chronic kidney disease: Secondary | ICD-10-CM | POA: Diagnosis not present

## 2019-08-31 DIAGNOSIS — I5032 Chronic diastolic (congestive) heart failure: Secondary | ICD-10-CM | POA: Diagnosis not present

## 2019-08-31 DIAGNOSIS — U071 COVID-19: Secondary | ICD-10-CM | POA: Diagnosis not present

## 2019-08-31 DIAGNOSIS — J1289 Other viral pneumonia: Secondary | ICD-10-CM | POA: Diagnosis not present

## 2019-08-31 DIAGNOSIS — F0281 Dementia in other diseases classified elsewhere with behavioral disturbance: Secondary | ICD-10-CM | POA: Diagnosis not present

## 2019-09-02 DIAGNOSIS — L89620 Pressure ulcer of left heel, unstageable: Secondary | ICD-10-CM | POA: Diagnosis not present

## 2019-09-02 DIAGNOSIS — F0281 Dementia in other diseases classified elsewhere with behavioral disturbance: Secondary | ICD-10-CM

## 2019-09-02 DIAGNOSIS — I130 Hypertensive heart and chronic kidney disease with heart failure and stage 1 through stage 4 chronic kidney disease, or unspecified chronic kidney disease: Secondary | ICD-10-CM | POA: Diagnosis not present

## 2019-09-02 DIAGNOSIS — I2694 Multiple subsegmental pulmonary emboli without acute cor pulmonale: Secondary | ICD-10-CM

## 2019-09-02 DIAGNOSIS — L89153 Pressure ulcer of sacral region, stage 3: Secondary | ICD-10-CM | POA: Diagnosis not present

## 2019-09-02 DIAGNOSIS — G301 Alzheimer's disease with late onset: Secondary | ICD-10-CM | POA: Diagnosis not present

## 2019-09-02 DIAGNOSIS — E1122 Type 2 diabetes mellitus with diabetic chronic kidney disease: Secondary | ICD-10-CM

## 2019-09-02 DIAGNOSIS — I82403 Acute embolism and thrombosis of unspecified deep veins of lower extremity, bilateral: Secondary | ICD-10-CM

## 2019-09-02 DIAGNOSIS — L89610 Pressure ulcer of right heel, unstageable: Secondary | ICD-10-CM | POA: Diagnosis not present

## 2019-09-03 ENCOUNTER — Other Ambulatory Visit: Payer: Self-pay

## 2019-09-03 ENCOUNTER — Telehealth (INDEPENDENT_AMBULATORY_CARE_PROVIDER_SITE_OTHER): Payer: Medicare HMO | Admitting: Internal Medicine

## 2019-09-03 ENCOUNTER — Encounter: Payer: Self-pay | Admitting: Internal Medicine

## 2019-09-03 VITALS — BP 91/67 | HR 89 | Temp 97.2°F | Ht 72.0 in | Wt 168.0 lb

## 2019-09-03 DIAGNOSIS — I2699 Other pulmonary embolism without acute cor pulmonale: Secondary | ICD-10-CM | POA: Diagnosis not present

## 2019-09-03 DIAGNOSIS — F0391 Unspecified dementia with behavioral disturbance: Secondary | ICD-10-CM

## 2019-09-03 DIAGNOSIS — R0989 Other specified symptoms and signs involving the circulatory and respiratory systems: Secondary | ICD-10-CM | POA: Diagnosis not present

## 2019-09-03 DIAGNOSIS — I5032 Chronic diastolic (congestive) heart failure: Secondary | ICD-10-CM

## 2019-09-03 DIAGNOSIS — L89302 Pressure ulcer of unspecified buttock, stage 2: Secondary | ICD-10-CM | POA: Diagnosis not present

## 2019-09-03 DIAGNOSIS — R5381 Other malaise: Secondary | ICD-10-CM | POA: Diagnosis not present

## 2019-09-03 DIAGNOSIS — F03B18 Unspecified dementia, moderate, with other behavioral disturbance: Secondary | ICD-10-CM

## 2019-09-03 DIAGNOSIS — Z7189 Other specified counseling: Secondary | ICD-10-CM

## 2019-09-03 MED ORDER — AZITHROMYCIN 250 MG PO TABS: ORAL_TABLET | ORAL | 0 refills | Status: DC

## 2019-09-03 MED ORDER — APIXABAN 5 MG PO TABS: 5.0000 mg | ORAL_TABLET | Freq: Two times a day (BID) | ORAL | 0 refills | Status: DC

## 2019-09-03 NOTE — Progress Notes (Signed)
This service is provided via telemedicine  No vital signs collected/recorded due to the encounter was a telemedicine visit.   Location of patient (ex: home, work):  Home   Patient consents to a telephone visit:  Yes   Location of the provider (ex: office, home):  [employer], Office   Name of any referring provider:  N/a  Names of all persons participating in the telemedicine service and their role in the encounter:  Tavaras Goody L. Mariea Clonts, D.O., Bonney Leitz, CMA, Dionne Ano (daughter) and patient.  Time spent on call:  9 min with medical assistant  Televisit  I attempted to connect with [name] and his daughter, [name], on 09/03/19 at  3:15 PM EST by a video enabled telemedicine application and verified that I am speaking with the correct person using two identifiers.  [name] was unable to get the video component of the call working at her end.    Location: Patient: Home with his daughter, [name] Provider:  Karstyn Birkey L. Kavitha Lansdale, D.O., C.M.D.   I discussed the limitations of evaluation and management by telemedicine and the availability of in person appointments. The patient expressed understanding and agreed to proceed.  History of Present Illness: He's been coughing and his daughter hears a lot of breathing, wheezing.  He had an albuterol treatment.  She's concerned.  His oxygen is 97%.  She says that [facility] is overwhelmed with caring for him.  She says the nurse rang the doorbell and she didn't hear it.  She says she was looking out for him.  No swelling of his ankles.    He is unable to expectorate the mucinex.  He ate a little this morning.  Usually has oatmeal with bananas, but did not want to this morning.  Did eat well last night.  Has been drinking fluids and had applesauce with meds and a boost.    He was supposed to get a mattress for his pressure ulcers--[facility] never called her back.  The bed shorted out.  One of the sores came back and looks a little worse than the  day before.  She was hoping the nurse was gonna see them.  [name] says that they've been taking away people and not having them come.  She says his dad said he does not want to give up (yesterday) and wants to keep going as long as he can.    She asks for antibiotics for him.    Behavior has been fine.  Sleeping at night.  Only his bottom hurts when she's cleaning that.    Had twice a week baths and then she did not want Jeneen Rinks, PT, coming back out due to behaviors.  She says he didn't want to clean his dad and she couldn't come in with the dad.  Nurse was in driveway and then he started to clean him.  [name] told him not to come back.    Observations/Objective: Vitals:   09/03/19 1529  BP: 91/67  Pulse: 89  Temp: (!) 97.2 F (36.2 C)  SpO2: 97%  No other exam could be performed as patient's daughter could not operate the video component at her end. Pt could be heard speaking in the background.  Did not cough that I heard.  Did drink some water by [facility] report.  Assessment and Plan: 1. Other pulmonary embolism without acute cor pulmonale, unspecified chronicity ([place]) - requested refills of meds - not hypoxic - apixaban (ELIQUIS) 5 MG TABS tablet; Take 1 tablet (5  mg total) by mouth 2 (two) times daily.  Dispense: 60 tablet; Refill: 0  2. Symptoms of upper respiratory infection (URI) - ? Bronchitis vs residual covid vs secondary bacterial infection--hard to know w/o seeing him -wanting to prevent deterioration and another hospitalization, will tx with zpak - azithromycin (ZITHROMAX) 250 MG tablet; 2 tabs today, then one daily for 4 days  Dispense: 6 tablet; Refill: 0  3. Moderate dementia with behavioral disturbance ([place]) -[name] reports no recent behavioral problems and dad is sleeping well -remains on namzaric MMSE - Mini Mental State Exam 04/26/2018 04/18/2017 12/10/2016  Orientation to time 0 2 0  Orientation to Place 1 4 2   Registration 3 3 3   Attention/ Calculation 0 0 0  Recall 0 2  0  Language- name 2 objects 2 2 2   Language- repeat 1 0 1  Language- follow 3 step command 3 3 2   Language- read & follow direction 0 0 1  Write a sentence 0 0 0  Copy design 0 0 0  Total score 10 16 11    4. Pressure injury of buttock, stage 2, unspecified laterality ([place]) -unclear what stage or size this really is at this point-Mary seems to think it's bigger than it had been -she says they need larger dressings and home care was ordering?  5. Debility -failure to thrive -he did not eat well today, but reportedly did yesterday -getting boost, applesauce and his daughter says he's drinking well -discussed all of what's going on with him and advance care planning when she brought up that she refused hospital when she was told she "could not call ems"   6. Chronic diastolic CHF (congestive heart failure) ([place]) -[name] reports he does not have swelling of his ankles or feet  7.  ACP -spoke with [name] 16 mins about her dad's chronic illnesses making him quite ill plus his advanced age -she knows he will not be around forever, but says he told her just yesterday that he wants to keep on living (then not eating today) -discussed that in modern medicine we can prolong people's lives for a long time even when the quality is not good, when they start having behavioral concerns in dementia b/c they are hurting or suffering in some way--she is NOT ready to just let him go and wants to still fight to get another day or week with him -she refused [facility] with [facility]  Follow Up Instructions:  I discussed the assessment and treatment plan with the patient's daughter.  She was provided an opportunity to ask questions and all were answered. The patient agreed with the plan and demonstrated an understanding of the instructions.   The patient was advised to call back or seek an in-person evaluation if the symptoms worsen or if the condition fails to improve as anticipated.  I provided 21  minutes of non-face-to-face time during this encounter for medical mgt, 16 mins on ACP.  Naydeline Morace L. Marlan Steward, D.O. [facility] [address], [place] [address] On Call:  [phone] & follow prompts after 5pm & weekends Office Phone:  [phone] Office Fax:  [phone]

## 2019-09-03 NOTE — Patient Instructions (Signed)
I recommend using liquid mucinex instead of the tablets or capsules that your dad is having trouble swallowing.  I did prescribe a z-pak for his upper respiratory symptoms.  I as pleased to learn his oxygen saturation was good.  Continue the albuterol as ordered.  We will check with [facility] about whether they are returning to see your dad and about the dressings that were being ordered for his foot and buttocks.    I recommend that you give your dad one of his doses of tylenol 30-60 mins before his buttock dressing changes to help with pain.    We will also call [facility] to check on the mattress that he needs.

## 2019-09-04 ENCOUNTER — Telehealth: Payer: Self-pay

## 2019-09-04 DIAGNOSIS — R5381 Other malaise: Secondary | ICD-10-CM

## 2019-09-04 DIAGNOSIS — F03B18 Unspecified dementia, moderate, with other behavioral disturbance: Secondary | ICD-10-CM

## 2019-09-04 DIAGNOSIS — F0391 Unspecified dementia with behavioral disturbance: Secondary | ICD-10-CM

## 2019-09-04 DIAGNOSIS — L89302 Pressure ulcer of unspecified buttock, stage 2: Secondary | ICD-10-CM

## 2019-09-04 NOTE — Telephone Encounter (Signed)
Dr. Mariea Clonts, I do not know what to put in this referral, it's pending for your help.

## 2019-09-04 NOTE — Telephone Encounter (Signed)
[facility] nursing for his pressure injuries and PT for weakness, home safety evaluation and social work for sure.  As far as the mattress, we can get that through [facility].  Reportedly this mattress was previously ordered so we would be able to use the same order.  Unfortunately, I was unable to actually visualize his wound as [name] was unable to do the virtual part of the visit and it was purely a phone visit mostly with her so it's difficult to make a good recommendation for dressings or mattresses without any visualization of the patient.

## 2019-09-04 NOTE — Telephone Encounter (Signed)
Incoming called received from [facility] Patent attorney) stating patients daughter [name] just called her and cussed her out (literally used several cuss words) for the second time today, [name] also made racist comments towards Maggie.  Maggie indicates patient was also calling Dr.Reed out of her name in the conversation and stated no one knows what they are doing.  Burman Nieves has considered filing a report with APS yet the MSW thinks [name] is overwhelmed with caring for her father vs neglecting him.    Patient mentioned she was going to fire the agency yet wanted to know when there next visit will be. Maggie let [name] know the Nurse Aid was planning to come out tomorrow and they Nurse was scheduled to come out Thursday. [name] had told Burman Nieves Thursday is not going to work. Mary abruptly hung up the phone on [place].  Burman Nieves states that with the level of disrespect and inconsistencies encountered with the patients daughter the best course of action at this time is for Dr.Reed to refer patient to a different agency.

## 2019-09-04 NOTE — Addendum Note (Signed)
Addended by: Despina Hidden on: 09/04/2019 04:29 PM   Modules accepted: Orders

## 2019-09-04 NOTE — Telephone Encounter (Signed)
I spoke with [name] Patent attorney) at [place] at [facility]. She stated that patient's daughter, [name], was difficult to work with.  She would not answer the door, wanted therapy to come within a narrow window, and was told that they were unable to do that.  Maggie also stated that therapy is spending an extensive amount of time cleaning the patient, as he is lying in feces.   [name] has complained about the hospital bed not working, but has been told to call [facility] and has not done so.  Maggie stated she had the Rx from Dr. Mariea Clonts for the mattress, but [name] said there was no need to bring the mattress, as the hospital bed wasn't working.   Maggie stated she set the patient up for Hospice, which the daughter was agreeable to originally, but is no longer agreeable, even after explaining that patient would be able to get more services under Hospice care.   Patient has an aide 1x week, a nurse 2x week, and unsure of the frequency but also has OT and PT.   Maggie was going to follow up with [facility]/daughter on the hospital bed and mattress. She stated that the patient needs care that exceeds their services. Maggie stated they also recommended her signing up for Medicaid, but daughter is unwilling, as she will be liable for any balances.

## 2019-09-04 NOTE — Telephone Encounter (Signed)
Patient's daugher calling asking for a new order for a mattress, daughter states she doesn't want anything from [facility]. Daughter also doesn't want to use [facility]. I asked daughter which home health would she like to use and she said " I don't know" I asked if she has used [facility] before and she doesn't know. Daughter started talking negative about [facility] and Dr. Mariea Clonts and I changed the conversation. Please advise on the order to a home health for a mattress.

## 2019-09-04 NOTE — Telephone Encounter (Signed)
[name], patients daughter called and stated that she was told the nurse called today and she would like to know what was said and what we have done about it. Mary asked that I hurry up and tell her what was discussed for she has limited time.   I began to read the conversation between [place] and Maggie Patent attorney). After every sentence [name] would yell and say the nurse is a bold face lie along with other choice words. [name] even mentioned if she could get her hands on the nurse she would bust her in the face for calling here lying.   [name] would not let me read the entire conversation before she hung up abruptly

## 2019-09-05 ENCOUNTER — Telehealth: Payer: Self-pay

## 2019-09-05 DIAGNOSIS — F0281 Dementia in other diseases classified elsewhere with behavioral disturbance: Secondary | ICD-10-CM | POA: Diagnosis not present

## 2019-09-05 DIAGNOSIS — R2689 Other abnormalities of gait and mobility: Secondary | ICD-10-CM | POA: Diagnosis not present

## 2019-09-05 DIAGNOSIS — L89153 Pressure ulcer of sacral region, stage 3: Secondary | ICD-10-CM | POA: Diagnosis not present

## 2019-09-05 DIAGNOSIS — I2694 Multiple subsegmental pulmonary emboli without acute cor pulmonale: Secondary | ICD-10-CM | POA: Diagnosis not present

## 2019-09-05 DIAGNOSIS — L89620 Pressure ulcer of left heel, unstageable: Secondary | ICD-10-CM | POA: Diagnosis not present

## 2019-09-05 DIAGNOSIS — E1122 Type 2 diabetes mellitus with diabetic chronic kidney disease: Secondary | ICD-10-CM | POA: Diagnosis not present

## 2019-09-05 DIAGNOSIS — L89610 Pressure ulcer of right heel, unstageable: Secondary | ICD-10-CM | POA: Diagnosis not present

## 2019-09-05 DIAGNOSIS — I130 Hypertensive heart and chronic kidney disease with heart failure and stage 1 through stage 4 chronic kidney disease, or unspecified chronic kidney disease: Secondary | ICD-10-CM | POA: Diagnosis not present

## 2019-09-05 DIAGNOSIS — G301 Alzheimer's disease with late onset: Secondary | ICD-10-CM | POA: Diagnosis not present

## 2019-09-05 DIAGNOSIS — I82403 Acute embolism and thrombosis of unspecified deep veins of lower extremity, bilateral: Secondary | ICD-10-CM | POA: Diagnosis not present

## 2019-09-05 NOTE — Telephone Encounter (Signed)
Order faxed to [facility] for mattress

## 2019-09-05 NOTE — Addendum Note (Signed)
Addended by: Gayland Curry on: 09/05/2019 08:16 AM   Modules accepted: Orders

## 2019-09-05 NOTE — Telephone Encounter (Signed)
For documentation--I agree with the referral to APS.  When I referred to the next agency [facility]), I did include a social work referral.  It is very challenging to provide his care when I'm never able to see him (all phone visits) and the items that are recommended by home health, myself and the other providers are not followed up on in the home environment.  His daughter, [name], seems to be overwhelmed with her work and home responsibilities.

## 2019-09-05 NOTE — Telephone Encounter (Signed)
Maggie, Midwife with [facility] called to inform Dr.Reed that a nurse went out today to see [name] as a result of his daughter stating his wound was getting worse.   When the nurse arrived patient was soaking wet in urine despite his daughters knowledge of how to use a condom catheter. The nurse has shown [name] how to apply condom catheter several times.   Patients wound is worse 6 cm in length and 2.5 cm in width. 2 Band-Aids were plastered over the wound. [name] was taught how to do wound care as well.  As a result of all the above Maggie placed a call to [facility] [employer]. Maggie states [name] is not purposely neglectful yet patient is in a bad situation. Maggie had to give the case manager Dr.Reed's name and contact information.  In conclusion [name] states a visit was missed on Monday as a results of [name] not letting staff in the home.  [name] was informed we have placed a [place] referral to a different agency. Maggie state they will continue care until the new home health agency takes over and she feels really bad for the patient.

## 2019-09-05 NOTE — Telephone Encounter (Signed)
Completed home health referral.  Still need mattress order faxed to [facility].

## 2019-09-10 ENCOUNTER — Other Ambulatory Visit: Payer: Self-pay

## 2019-09-10 ENCOUNTER — Telehealth: Payer: Self-pay

## 2019-09-10 DIAGNOSIS — U071 COVID-19: Secondary | ICD-10-CM | POA: Diagnosis not present

## 2019-09-10 NOTE — Telephone Encounter (Signed)
Noted.  I know that our referral coordinator has been trying to arrange for another agency to go our there.

## 2019-09-10 NOTE — Patient Outreach (Signed)
[facility]) Care Management  [place]  09/10/2019   [name] 1922-07-28 [id number]  Subjective: Telephone call to daughter [name].  She reports that patient is doing good.  Asked her about home health.  She states that she had to get rid of [facility]. She did not go into detail.  She states that now she has [facility]. She states that the nurse came by for evaluation today but she could not do the visit long as she is trying to work. She states overall her Dad is doing good.  Asked about patient wound to his buttocks she did not go into detail other than she told the nurse about it.  Discussed with her patient care and how she is managing, she was short and only answered she is managing.  She states that she will see what [facility] is going to do as far as her Dad's care.  Advised her [facility] support.  She verbalized understanding and voices no needs presently.   Objective:   Encounter Medications:  Outpatient Encounter Medications as of 09/10/2019  Medication Sig  . albuterol (VENTOLIN HFA) 108 (90 Base) MCG/ACT inhaler Inhale 2 puffs into the lungs every 4 (four) hours as needed for wheezing or shortness of breath. INHALE TWO PUFFS EVERY 4-6 HOURS AS NEEDED FOR DIFFICULTY BREATHING,WHEEZING OR COUGH  . Alcohol Swabs (B-D SINGLE USE SWABS REGULAR) PADS USE AS DIRECTED WITH CHECKING BLOOD SUGAR THREE TIMES DAILY  . Alcohol Swabs (SURE-PREP ALCOHOL PREP) 70 % PADS 1 each by Does not apply route as needed.  [name] amLODipine (NORVASC) 5 MG tablet Take 1 tablet (5 mg total) by mouth daily.  [name] apixaban (ELIQUIS) 5 MG TABS tablet Take 1 tablet (5 mg total) by mouth 2 (two) times daily.  [name] aspirin EC 81 MG tablet Take 1 tablet (81 mg total) by mouth daily.  [name] azithromycin (ZITHROMAX) 250 MG tablet 2 tabs today, then one daily for 4 days  . Blood Glucose Calibration (ACCU-CHEK AVIVA) SOLN Use as Directed. Dx: E11.51  . Blood Glucose Monitoring Suppl (ACCU-CHEK AVIVA)  device Use to test blood sugar three times daily. Dx E11.51  . Control Gel Formula Dressing (DUODERM CGF BORDER) MISC Apply 1 each topically every 3 (three) days.  [name] escitalopram (LEXAPRO) 5 MG tablet Take 1 tablet (5 mg total) by mouth daily.  [name] gabapentin (NEURONTIN) 100 MG capsule Take 100 mg by mouth at bedtime.  [name] glucose blood (ACCU-CHEK AVIVA) test strip Use to test blood sugar three times daily. Dx: E11.51  . hydrochlorothiazide (HYDRODIURIL) 25 MG tablet Take 1 tablet (25 mg total) by mouth daily.  . insulin glargine (LANTUS) 100 UNIT/ML injection Inject 7 Units into the skin daily.  . Insulin Pen Needle (B-D UF III MINI PEN NEEDLES) 31G X 5 MM MISC USE AS DIRECTED.  Elmore Guise Devices (ACCU-CHEK SOFTCLIX) lancets Use as instructed with checking blood sugar three times daily. Dx: E11.51  . Loratadine 10 MG CAPS Take 1 capsule (10 mg total) by mouth daily.  [name] losartan (COZAAR) 50 MG tablet Take 1 tablet (50 mg total) by mouth daily.  . Memantine HCl-Donepezil HCl (NAMZARIC) 28-10 MG CP24 Take 1 capsule by mouth daily.  . Multiple Vitamins-Minerals (SENTRY SENIOR) TABS Take 1 tablet by mouth daily.  . simvastatin (ZOCOR) 20 MG tablet Take 1 tablet (20 mg total) by mouth daily.  . tamsulosin (FLOMAX) 0.4 MG CAPS capsule Take 1 capsule (0.4 mg total) by mouth daily  after supper.   No facility-administered encounter medications on file as of 09/10/2019.    Functional Status:  In your present state of health, do you have any difficulty performing the following activities: 07/16/2019 07/06/2019  Hearing? N Y  Vision? N N  Difficulty concentrating or making decisions? [name]  Comment Patient needs assistance with all aspects of care. -  Walking or climbing stairs? Y Y  Dressing or bathing? Y Y  Doing errands, shopping? [name]  Preparing Food and eating ? Y -  Comment needs help with all aspects of care. -  Using the Toilet? Y -  In the past six months, have you accidently leaked urine? Y -  Do  you have problems with loss of bowel control? Y -  Managing your Medications? Y -  Managing your Finances? Y -  Housekeeping or managing your Housekeeping? Y -  Some recent data might be hidden    Fall/Depression Screening: Fall Risk  07/18/2019 07/16/2019 06/07/2019  Falls in the past year? 0 1 1  Number falls in past yr: 0 1 1  Injury with Fall? 0 0 1  Comment - - -  Risk Factor Category  - - -  Risk for fall due to : - History of fall(s) -  Follow up - Falls prevention discussed -   PHQ 2/9 Scores 07/16/2019 07/16/2019 01/22/2019 12/01/2018 04/26/2018 07/05/2017 12/29/2016  PHQ - 2 Score - - 0 0 0 0 0  Exception Documentation Other- indicate reason in comment box Other- indicate reason in comment box - - - - -  Not completed - unable to assess - - - - -    Assessment: Daughter continues to manage patient care at this time.   Plan:  [facility] CM Care Plan Problem One     Most Recent Value  Care Plan Problem One  Recent Hospitalization COVID  Role Documenting the Problem One  Care Management Telephonic Coordinator  Care Plan for Problem One  Active  [facility] Long Term Goal   Patient will not experience unplanned hospital readmission within the next 31 days  [facility] Long Term Goal Start Date  09/10/19  Interventions for Problem One Long Term Goal  RN CM discussed COVID and patient care with daughter.  Advised of [facility] continued support.   [facility] CM Short Term Goal #1   Daughter will be abel to report regular involvement of home health in patient care within 29 days.   [facility] CM Short Term Goal #1 Start Date  09/10/19  Interventions for Short Term Goal #1  Discussed home health status with daughter and allowing support.      RN CM will contact again in the month of March and daughter agreeable.   Jone Baseman, RN, MSN [place] [facility] Care Management Coordinator Direct Line [phone] Cell [phone] Toll Free: [phone]  Fax: [phone]

## 2019-09-10 NOTE — Telephone Encounter (Signed)
The nurse called from [facility] this morning and stated that patient's daughter is refusing their visits.

## 2019-09-11 ENCOUNTER — Ambulatory Visit: Payer: Self-pay

## 2019-09-11 DIAGNOSIS — I5032 Chronic diastolic (congestive) heart failure: Secondary | ICD-10-CM

## 2019-09-11 DIAGNOSIS — I2699 Other pulmonary embolism without acute cor pulmonale: Secondary | ICD-10-CM | POA: Diagnosis not present

## 2019-09-11 DIAGNOSIS — L89302 Pressure ulcer of unspecified buttock, stage 2: Secondary | ICD-10-CM | POA: Diagnosis not present

## 2019-09-11 DIAGNOSIS — Z7901 Long term (current) use of anticoagulants: Secondary | ICD-10-CM

## 2019-09-11 DIAGNOSIS — F0391 Unspecified dementia with behavioral disturbance: Secondary | ICD-10-CM | POA: Diagnosis not present

## 2019-09-11 DIAGNOSIS — E119 Type 2 diabetes mellitus without complications: Secondary | ICD-10-CM

## 2019-09-11 DIAGNOSIS — M6281 Muscle weakness (generalized): Secondary | ICD-10-CM | POA: Diagnosis not present

## 2019-09-14 DIAGNOSIS — M6281 Muscle weakness (generalized): Secondary | ICD-10-CM | POA: Diagnosis not present

## 2019-09-14 DIAGNOSIS — E119 Type 2 diabetes mellitus without complications: Secondary | ICD-10-CM | POA: Diagnosis not present

## 2019-09-14 DIAGNOSIS — Z7901 Long term (current) use of anticoagulants: Secondary | ICD-10-CM | POA: Diagnosis not present

## 2019-09-14 DIAGNOSIS — L89302 Pressure ulcer of unspecified buttock, stage 2: Secondary | ICD-10-CM | POA: Diagnosis not present

## 2019-09-14 DIAGNOSIS — I2699 Other pulmonary embolism without acute cor pulmonale: Secondary | ICD-10-CM | POA: Diagnosis not present

## 2019-09-14 DIAGNOSIS — I5032 Chronic diastolic (congestive) heart failure: Secondary | ICD-10-CM | POA: Diagnosis not present

## 2019-09-14 DIAGNOSIS — F0391 Unspecified dementia with behavioral disturbance: Secondary | ICD-10-CM | POA: Diagnosis not present

## 2019-09-19 DIAGNOSIS — F0391 Unspecified dementia with behavioral disturbance: Secondary | ICD-10-CM | POA: Diagnosis not present

## 2019-09-19 DIAGNOSIS — M6281 Muscle weakness (generalized): Secondary | ICD-10-CM | POA: Diagnosis not present

## 2019-09-19 DIAGNOSIS — I5032 Chronic diastolic (congestive) heart failure: Secondary | ICD-10-CM | POA: Diagnosis not present

## 2019-09-19 DIAGNOSIS — L89302 Pressure ulcer of unspecified buttock, stage 2: Secondary | ICD-10-CM | POA: Diagnosis not present

## 2019-09-19 DIAGNOSIS — E119 Type 2 diabetes mellitus without complications: Secondary | ICD-10-CM | POA: Diagnosis not present

## 2019-09-19 DIAGNOSIS — Z7901 Long term (current) use of anticoagulants: Secondary | ICD-10-CM | POA: Diagnosis not present

## 2019-09-19 DIAGNOSIS — I2699 Other pulmonary embolism without acute cor pulmonale: Secondary | ICD-10-CM | POA: Diagnosis not present

## 2019-09-21 ENCOUNTER — Telehealth: Payer: Self-pay | Admitting: *Deleted

## 2019-09-21 DIAGNOSIS — M6281 Muscle weakness (generalized): Secondary | ICD-10-CM | POA: Diagnosis not present

## 2019-09-21 DIAGNOSIS — I2699 Other pulmonary embolism without acute cor pulmonale: Secondary | ICD-10-CM | POA: Diagnosis not present

## 2019-09-21 DIAGNOSIS — F0391 Unspecified dementia with behavioral disturbance: Secondary | ICD-10-CM | POA: Diagnosis not present

## 2019-09-21 DIAGNOSIS — L89302 Pressure ulcer of unspecified buttock, stage 2: Secondary | ICD-10-CM | POA: Diagnosis not present

## 2019-09-21 DIAGNOSIS — Z7901 Long term (current) use of anticoagulants: Secondary | ICD-10-CM | POA: Diagnosis not present

## 2019-09-21 DIAGNOSIS — E119 Type 2 diabetes mellitus without complications: Secondary | ICD-10-CM | POA: Diagnosis not present

## 2019-09-21 DIAGNOSIS — I5032 Chronic diastolic (congestive) heart failure: Secondary | ICD-10-CM | POA: Diagnosis not present

## 2019-09-21 NOTE — Telephone Encounter (Signed)
Per Dr. Mariea Clonts- patient needs an appointment to fill out the [place].  Daughter notified and scheduled an appointment for 09/27/19. Forms placed in [place] under 25.

## 2019-09-21 NOTE — Telephone Encounter (Signed)
Received [facility] Forms from daughter, [name]. Placed form in Dr. Cyndi Lennert folder to review and sign. To call daughter [name] once signed [phone].

## 2019-09-22 DIAGNOSIS — I5032 Chronic diastolic (congestive) heart failure: Secondary | ICD-10-CM | POA: Diagnosis not present

## 2019-09-22 DIAGNOSIS — L89302 Pressure ulcer of unspecified buttock, stage 2: Secondary | ICD-10-CM | POA: Diagnosis not present

## 2019-09-22 DIAGNOSIS — M6281 Muscle weakness (generalized): Secondary | ICD-10-CM | POA: Diagnosis not present

## 2019-09-22 DIAGNOSIS — E119 Type 2 diabetes mellitus without complications: Secondary | ICD-10-CM | POA: Diagnosis not present

## 2019-09-22 DIAGNOSIS — I2699 Other pulmonary embolism without acute cor pulmonale: Secondary | ICD-10-CM | POA: Diagnosis not present

## 2019-09-22 DIAGNOSIS — F0391 Unspecified dementia with behavioral disturbance: Secondary | ICD-10-CM | POA: Diagnosis not present

## 2019-09-22 DIAGNOSIS — Z7901 Long term (current) use of anticoagulants: Secondary | ICD-10-CM | POA: Diagnosis not present

## 2019-09-26 ENCOUNTER — Telehealth: Payer: Self-pay

## 2019-09-26 NOTE — Telephone Encounter (Addendum)
Late entry:  Patients daughter, [name] called yesterday asking if we could set up having EMS transport her father to and from his pending appointment with Dr.Reed on Thursday 09/27/19.  [name] states she spoke with the insurance company and was told we can set up EMS services for patient and it would only cost the patient around 250 dollars.  Mary called EMS prior to calling us and was told it would be 1000 dollars (500 here & 500 back).   I advised [name] that she or a family member will be responsible for orchestrating EMS services for we are not involved in that process despite what the insurance company told her.  Mary asked why did EMS tell her a different price and I informed [name] she will need defer back to EMS for again we don't play a part in this process and I did not have any information to provide on the subject.   Mary then stated her job was calling and she had to go, call was ended abruptly.

## 2019-09-27 ENCOUNTER — Ambulatory Visit: Payer: Self-pay | Admitting: Internal Medicine

## 2019-09-27 ENCOUNTER — Telehealth: Payer: Self-pay | Admitting: Internal Medicine

## 2019-09-27 DIAGNOSIS — F0391 Unspecified dementia with behavioral disturbance: Secondary | ICD-10-CM | POA: Diagnosis not present

## 2019-09-27 DIAGNOSIS — E119 Type 2 diabetes mellitus without complications: Secondary | ICD-10-CM | POA: Diagnosis not present

## 2019-09-27 DIAGNOSIS — L89302 Pressure ulcer of unspecified buttock, stage 2: Secondary | ICD-10-CM | POA: Diagnosis not present

## 2019-09-27 DIAGNOSIS — M6281 Muscle weakness (generalized): Secondary | ICD-10-CM | POA: Diagnosis not present

## 2019-09-27 DIAGNOSIS — I5032 Chronic diastolic (congestive) heart failure: Secondary | ICD-10-CM | POA: Diagnosis not present

## 2019-09-27 DIAGNOSIS — I2699 Other pulmonary embolism without acute cor pulmonale: Secondary | ICD-10-CM | POA: Diagnosis not present

## 2019-09-27 DIAGNOSIS — Z7901 Long term (current) use of anticoagulants: Secondary | ICD-10-CM | POA: Diagnosis not present

## 2019-09-27 NOTE — Telephone Encounter (Signed)
Looked at my schedule near the end of this morning and appt for in-person visit for patient was canceled by his daughter.

## 2019-09-28 DIAGNOSIS — I2699 Other pulmonary embolism without acute cor pulmonale: Secondary | ICD-10-CM | POA: Diagnosis not present

## 2019-09-28 DIAGNOSIS — F0391 Unspecified dementia with behavioral disturbance: Secondary | ICD-10-CM | POA: Diagnosis not present

## 2019-09-28 DIAGNOSIS — L89302 Pressure ulcer of unspecified buttock, stage 2: Secondary | ICD-10-CM | POA: Diagnosis not present

## 2019-09-28 DIAGNOSIS — M6281 Muscle weakness (generalized): Secondary | ICD-10-CM | POA: Diagnosis not present

## 2019-09-28 DIAGNOSIS — I5032 Chronic diastolic (congestive) heart failure: Secondary | ICD-10-CM | POA: Diagnosis not present

## 2019-09-28 DIAGNOSIS — Z7901 Long term (current) use of anticoagulants: Secondary | ICD-10-CM | POA: Diagnosis not present

## 2019-09-28 DIAGNOSIS — E119 Type 2 diabetes mellitus without complications: Secondary | ICD-10-CM | POA: Diagnosis not present

## 2019-10-01 NOTE — Telephone Encounter (Signed)
Patient daughter, [name], called back and stated that we need to call [facility] and get authorization for EMS to Transport patient to appointment here on Friday. I explained to her that this was not a process our office did but that she would need to arrange. (also see Chrae's message) Please Advise

## 2019-10-01 NOTE — Telephone Encounter (Signed)
Agree.  Other patients that come via EMS have had their families to arrange for them.

## 2019-10-02 ENCOUNTER — Other Ambulatory Visit: Payer: Self-pay | Admitting: Internal Medicine

## 2019-10-02 DIAGNOSIS — R0989 Other specified symptoms and signs involving the circulatory and respiratory systems: Secondary | ICD-10-CM

## 2019-10-02 NOTE — Telephone Encounter (Signed)
Spoke with daughter and advised results.  

## 2019-10-02 NOTE — Telephone Encounter (Signed)
rx for antibiotic, ok to fill?

## 2019-10-02 NOTE — Telephone Encounter (Signed)
Patient daughter, [name] notified and requesting to speak with Glass blower/designer.

## 2019-10-02 NOTE — Telephone Encounter (Signed)
No, we don't refill zpaks unless we see someone and determine they are still sick enough to require more antibiotics.

## 2019-10-03 ENCOUNTER — Other Ambulatory Visit: Payer: Self-pay

## 2019-10-03 ENCOUNTER — Ambulatory Visit (INDEPENDENT_AMBULATORY_CARE_PROVIDER_SITE_OTHER): Payer: Medicare HMO | Admitting: Family

## 2019-10-03 ENCOUNTER — Encounter: Payer: Self-pay | Admitting: Family

## 2019-10-03 VITALS — Temp 97.7°F

## 2019-10-03 DIAGNOSIS — R059 Cough, unspecified: Secondary | ICD-10-CM

## 2019-10-03 DIAGNOSIS — R2689 Other abnormalities of gait and mobility: Secondary | ICD-10-CM | POA: Diagnosis not present

## 2019-10-03 DIAGNOSIS — R05 Cough: Secondary | ICD-10-CM

## 2019-10-03 DIAGNOSIS — L89153 Pressure ulcer of sacral region, stage 3: Secondary | ICD-10-CM | POA: Diagnosis not present

## 2019-10-03 NOTE — Patient Instructions (Signed)
-   Continue with Mucinex cough syrup as needed for cough. - Continue on Albuterol inhaler every 4 hours as needed for cough or wheezing. - Check Temp,oxygen saturation daily and notify provider if running any fever or oxygen saturation < 90%. - May send to ED to if having shortness of breath or symptoms worsen. - Follow up in 2 days at the office as scheduled with Dr.Reed.

## 2019-10-03 NOTE — Progress Notes (Signed)
Patient ID: [name], male   DOB: 1922/03/19, 86 y.o.   MRN: [id number] This service is provided via telemedicine  No vital signs collected/recorded due to the encounter was a telemedicine visit.   Location of patient (ex: home, work):  HOME  Patient consents to a telephone visit:  YES  Location of the provider (ex: office, home):  OFFICE  Name of any referring provider:  TIFFANY REED, DO  Names of all persons participating in the telemedicine service and their role in the encounter:  DAUGHTER, PATIENT, [name], [place], [facility] [name] Macrae, NP  Time spent on call:  5:24    Provider: Delesa Kawa FNP-C  Gayland Curry, DO  Patient Care Team: Gayland Curry, DO as PCP - General (Geriatric Medicine) [name], [place] as Referring Physician (Optometry) Delice Lesch Lezlie Octave, MD as Consulting Physician (Neurology) Jon Billings, RN as [place] Management  Extended Emergency Contact Information Primary Emergency Contact: Golob,Mary Address: [address]          [place], [place] [address] [name] of [place] Phone: [phone] Relation: Daughter Secondary Emergency Contact: [name] [place] of [place] Phone: [phone] Mobile Phone: [phone] Relation: Other  Code Status: Full Code  Goals of care: Advanced Directive information Advanced Directives 09/03/2019  Does Patient Have a Medical Advance Directive? Yes  Type of Advance Directive [place]  Does patient want to make changes to medical advance directive? No - Patient declined  Copy of [place] in Chart? Yes - validated most recent copy scanned in chart (See row information)  Would patient like information on creating a medical advance directive? -     Chief Complaint  Patient presents with  . Acute Visit    COUGHING AND WHEEZING, x3 days    HPI:  Pt is a 86 y.o. male seen today for an acute visit for evaluation of  Coughing  and wheezing x 3 days.Patient's daughter provides HPI information due to patient's cognitive impairment. The cough is described as productive though patient swallows mucous daughter unable to see.He has had wheezing that was worst yesterday.Daughter gave him mucinex and albuterol which seems to have helped.usually uses Albuterol inhaler twice daily. No dyspnea or hemoptysis noted.No fever,chills,chest tightness or chest pain.Daughter states no worsening leg edema.Patient responsive during visit states no problems.He has a medical history of Chronic diastolic congestive Heart Failure.He is bed bound and requires assistance to get out of bed.Daughter states patient sitting up in bed doing much better today. Daughter states has upcoming appointment with PCP Dr.Reed on 10/05/2019 to complete VA paper work and a 4 months follow up on 10/11/2019.she request to combine this upcoming appointment due to issues with Transportation and requires assistance to transfer patient out of bed.I've discussed with POA that will contact Dr.Reed to see if visit can be combined then [name] will call her back.Daughter verbalized understanding.   Past Medical History:  Diagnosis Date  . Arthritis   . CAD (coronary artery disease)    s/p DES to LAD 06/2013  . Dementia with behavioral disturbance ([place])   . Depression   . Dyslipidemia   . Glaucoma   . Hyperglycemia 04/17/2014  . Hyperlipidemia   . Hypertension   . Stroke ([place])   . Type 2 diabetes mellitus ([place])    Past Surgical History:  Procedure Laterality Date  . CARDIAC CATHETERIZATION  06/2013   DES to LAD by Dr Tamala Julian  . Right Cataract  Allergies  Allergen Reactions  . Lisinopril Cough    Outpatient Encounter Medications as of 10/03/2019  Medication Sig  . albuterol (VENTOLIN HFA) 108 (90 Base) MCG/ACT inhaler Inhale 2 puffs into the lungs every 4 (four) hours as needed for wheezing or shortness of breath. INHALE TWO PUFFS EVERY 4-6 HOURS AS  NEEDED FOR DIFFICULTY BREATHING,WHEEZING OR COUGH (Patient taking differently: Inhale 2 puffs into the lungs every 4 (four) hours as needed for wheezing or shortness of breath. )  . Alcohol Swabs (SURE-PREP ALCOHOL PREP) 70 % PADS 1 each by Does not apply route as needed.  [name] amLODipine (NORVASC) 5 MG tablet Take 1 tablet (5 mg total) by mouth daily.  [name] apixaban (ELIQUIS) 5 MG TABS tablet Take 1 tablet (5 mg total) by mouth 2 (two) times daily.  [name] aspirin EC 81 MG tablet Take 1 tablet (81 mg total) by mouth daily.  . Blood Glucose Monitoring Suppl (ACCU-CHEK AVIVA) device Use to test blood sugar three times daily. Dx E11.51  . escitalopram (LEXAPRO) 5 MG tablet Take 1 tablet (5 mg total) by mouth daily.  [name] glucose blood (ACCU-CHEK AVIVA) test strip Use to test blood sugar three times daily. Dx: E11.51  . hydrochlorothiazide (HYDRODIURIL) 25 MG tablet Take 1 tablet (25 mg total) by mouth daily.  . insulin glargine (LANTUS) 100 UNIT/ML injection Inject 7 Units into the skin daily.  . Insulin Pen Needle (B-D UF III MINI PEN NEEDLES) 31G X 5 MM MISC USE AS DIRECTED.  Elmore Guise Devices (ACCU-CHEK SOFTCLIX) lancets Use as instructed with checking blood sugar three times daily. Dx: E11.51  . Loratadine 10 MG CAPS Take 1 capsule (10 mg total) by mouth daily.  [name] losartan (COZAAR) 50 MG tablet Take 1 tablet (50 mg total) by mouth daily.  . Memantine HCl-Donepezil HCl (NAMZARIC) 28-10 MG CP24 Take 1 capsule by mouth daily.  . Multiple Vitamins-Minerals (SENTRY SENIOR) TABS Take 1 tablet by mouth daily.  . simvastatin (ZOCOR) 20 MG tablet Take 1 tablet (20 mg total) by mouth daily.  . tamsulosin (FLOMAX) 0.4 MG CAPS capsule Take 1 capsule (0.4 mg total) by mouth daily after supper.  . [DISCONTINUED] Alcohol Swabs (B-D SINGLE USE SWABS REGULAR) PADS USE AS DIRECTED WITH CHECKING BLOOD SUGAR THREE TIMES DAILY  . [DISCONTINUED] gabapentin (NEURONTIN) 100 MG capsule Take 100 mg by mouth at bedtime.  .  [DISCONTINUED] azithromycin (ZITHROMAX) 250 MG tablet 2 tabs today, then one daily for 4 days  . [DISCONTINUED] Blood Glucose Calibration (ACCU-CHEK AVIVA) SOLN Use as Directed. Dx: E11.51  . [DISCONTINUED] Control Gel Formula Dressing (DUODERM CGF BORDER) MISC Apply 1 each topically every 3 (three) days.   No facility-administered encounter medications on file as of 10/03/2019.    Review of Systems  Unable to perform ROS: Dementia (Information provided by patient's daughter [name] )  Constitutional: Negative for appetite change, chills, fatigue and fever.  HENT: Negative for congestion, postnasal drip, rhinorrhea, sinus pressure, sinus pain, sneezing, sore throat and trouble swallowing.   Respiratory: Positive for cough and wheezing. Negative for chest tightness and shortness of breath.   Cardiovascular: Negative for chest pain and palpitations.  Gastrointestinal: Negative for abdominal distention, abdominal pain, constipation, diarrhea, nausea and vomiting.  Musculoskeletal: Positive for gait problem.  Skin: Negative for color change, pallor and rash.  Neurological: Negative for dizziness, light-headedness and headaches.  Psychiatric/Behavioral: Negative for sleep disturbance.    Immunization History  Administered Date(s) Administered  . Fluad Quad(high Dose 65+) 04/12/2019  .  Influenza, High Dose Seasonal PF 06/22/2013, 05/30/2015  . Influenza,inj,Quad PF,6+ Mos 04/19/2014, 04/02/2016, 04/01/2017, 04/26/2018  . Pneumococcal Conjugate-13 07/25/2014  . Pneumococcal Polysaccharide-23 08/03/2011, 06/22/2013  . Td 10/22/2013  . Tdap 07/30/2015  . Zoster Recombinat (Shingrix) 05/20/2017, 07/20/2017   Pertinent  Health Maintenance Due  Topic Date Due  . OPHTHALMOLOGY EXAM  06/14/2018  . FOOT EXAM  10/05/2018  . HEMOGLOBIN A1C  11/30/2019  . INFLUENZA VACCINE  Completed  . PNA vac Low Risk Adult  Completed   Fall Risk  10/03/2019 07/18/2019 07/16/2019 06/07/2019 06/01/2019  Falls in the  past year? 0 0 1 1 1   Number falls in past yr: 0 0 1 1 1   Injury with Fall? 0 0 0 1 1  Comment - - - - -  Risk Factor Category  - - - - -  Risk for fall due to : - - History of fall(s) - -  Follow up - - Falls prevention discussed - -   Vitals:   10/03/19 1021  Temp: 97.7 F (36.5 C)  SpO2: 90%   There is no height or weight on file to calculate BMI. Physical Exam Unable to complete on telephone visit.   Labs reviewed: Recent Labs    07/06/19 0204 07/07/19 0033 07/09/19 0335  NA 143 144 143  K 4.9 4.6 4.7  CL 105 105 103  CO2 30 29 30   GLUCOSE 193* 226* 91  BUN 29* 36* 35*  CREATININE 1.29* 1.12 0.98  CALCIUM 8.9 8.9 9.3  MG  --  2.1  --    Recent Labs    07/06/19 0204 07/07/19 0033 07/09/19 0335  AST 35 47* 94*  ALT 30 38 107*  ALKPHOS 69 59 62  BILITOT 0.7 0.2* 0.3  PROT 6.5 6.2* 6.5  ALBUMIN 3.2* 2.9* 3.3*   Recent Labs    07/05/19 1536 07/06/19 0204 07/07/19 0033  WBC 3.8* 4.0 2.6*  NEUTROABS 2.2 2.9 1.9  HGB 9.7* 10.5* 9.0*  HCT 31.4* 34.1* 29.1*  MCV 96.3 96.6 95.4  PLT 170 162 179   Lab Results  Component Value Date   TSH 1.527 06/01/2019   Lab Results  Component Value Date   HGBA1C 6.3 (H) 06/02/2019   Lab Results  Component Value Date   CHOL 134 06/02/2019   HDL 48 06/02/2019   LDLCALC 74 06/02/2019   LDLDIRECT 92.0 11/24/2015   TRIG 126 07/05/2019   CHOLHDL 2.8 06/02/2019    Significant Diagnostic Results in last 30 days:  No results found.  Assessment/Plan  Cough in adult Afebrile.cough and wheezing has improved today per daughter. - Advised to continue with Mucinex cough syrup as needed for cough. - continue on Albuterol inhaler every 4 hours as needed for cough or wheezing. - Encouraged to check Temp,oxygen saturation daily and notify provider if running any fever or oxygen saturation < 90%. - May send to ED to if having shortness of breath or symptoms worsen. - Follow up in 2 days at the office as scheduled with  Dr.Reed.  Family/ staff Communication: Reviewed plan of care with patient and   Labs/tests ordered: None   Next Appointment: Has upcoming appointment 10/05/2019 with Dr.Reed.   Spent 15 minutes of non-face to face with patient and Daughter [name].Daughter verbalized understanding.     Sandrea Hughs, NP

## 2019-10-04 ENCOUNTER — Telehealth: Payer: Self-pay | Admitting: *Deleted

## 2019-10-04 DIAGNOSIS — L89302 Pressure ulcer of unspecified buttock, stage 2: Secondary | ICD-10-CM | POA: Diagnosis not present

## 2019-10-04 DIAGNOSIS — F0391 Unspecified dementia with behavioral disturbance: Secondary | ICD-10-CM | POA: Diagnosis not present

## 2019-10-04 DIAGNOSIS — E119 Type 2 diabetes mellitus without complications: Secondary | ICD-10-CM | POA: Diagnosis not present

## 2019-10-04 DIAGNOSIS — I2699 Other pulmonary embolism without acute cor pulmonale: Secondary | ICD-10-CM | POA: Diagnosis not present

## 2019-10-04 DIAGNOSIS — M6281 Muscle weakness (generalized): Secondary | ICD-10-CM | POA: Diagnosis not present

## 2019-10-04 DIAGNOSIS — I5032 Chronic diastolic (congestive) heart failure: Secondary | ICD-10-CM | POA: Diagnosis not present

## 2019-10-04 DIAGNOSIS — Z7901 Long term (current) use of anticoagulants: Secondary | ICD-10-CM | POA: Diagnosis not present

## 2019-10-04 DIAGNOSIS — L89306 Pressure-induced deep tissue damage of unspecified buttock: Secondary | ICD-10-CM

## 2019-10-04 NOTE — Telephone Encounter (Signed)
Ok, I will evaluate him at that time.  I doubt that the wound center is an option considering that he is requiring ambulance transport based on the phone calls I've been reading lately from his daughter.

## 2019-10-04 NOTE — Telephone Encounter (Signed)
[name] with [facility] called and stated that she saw patient this morning for his wound and it is no better. Stated that the wound to the sacrum is getting deeper, has slough and black escrow in it and not improving. Nurse thinks patient needs a referral to the [facility] to have it evaluated. Patient has an appointment scheduled with our office tomorrow to see you at 10:00.  Please Advise.

## 2019-10-05 ENCOUNTER — Ambulatory Visit: Payer: Self-pay | Admitting: Internal Medicine

## 2019-10-05 DIAGNOSIS — Z7901 Long term (current) use of anticoagulants: Secondary | ICD-10-CM | POA: Diagnosis not present

## 2019-10-05 DIAGNOSIS — M6281 Muscle weakness (generalized): Secondary | ICD-10-CM | POA: Diagnosis not present

## 2019-10-05 DIAGNOSIS — F0391 Unspecified dementia with behavioral disturbance: Secondary | ICD-10-CM | POA: Diagnosis not present

## 2019-10-05 DIAGNOSIS — E119 Type 2 diabetes mellitus without complications: Secondary | ICD-10-CM | POA: Diagnosis not present

## 2019-10-05 DIAGNOSIS — I5032 Chronic diastolic (congestive) heart failure: Secondary | ICD-10-CM | POA: Diagnosis not present

## 2019-10-05 DIAGNOSIS — I2699 Other pulmonary embolism without acute cor pulmonale: Secondary | ICD-10-CM | POA: Diagnosis not present

## 2019-10-05 DIAGNOSIS — L89302 Pressure ulcer of unspecified buttock, stage 2: Secondary | ICD-10-CM | POA: Diagnosis not present

## 2019-10-05 NOTE — Addendum Note (Signed)
Addended by: Gayland Curry on: 10/05/2019 10:22 AM   Modules accepted: Orders

## 2019-10-05 NOTE — Telephone Encounter (Signed)
Reviewed my schedule for today and, once again, appt has been rescheduled for next week.  Note, he now has two appts next week (two days in a row).  We need to find out which one he's actually coming to.

## 2019-10-05 NOTE — Telephone Encounter (Signed)
Referral to wound care center was placed in hopes it will expedite a physician laying eyes on the pressure injury, but it is essential that they keep his appt with me next Friday for an overall assessment.

## 2019-10-05 NOTE — Telephone Encounter (Signed)
Called and spoke with [facility]. She is wanting the appointment for next Friday the 12th. I canceled the appointment for the 11th.  [name] stated that they were suppose to come today but her nephew never showed up to help her get patient out of the house. They do not have a ramp yet and she needed help getting patient to the car. She is in the process of getting a ramp to make it easier.   She is requesting a referral to the wound care center due to the wound getting worse. Wants to know if you can go ahead and do the referral. She is going to have someone help her next Friday to get patient out of house to the appointment at our office.   Please Advise.

## 2019-10-05 NOTE — Telephone Encounter (Signed)
Daughter stated that she has nephew coming to help her get him here.

## 2019-10-06 ENCOUNTER — Other Ambulatory Visit: Payer: Self-pay | Admitting: Internal Medicine

## 2019-10-06 DIAGNOSIS — I2699 Other pulmonary embolism without acute cor pulmonale: Secondary | ICD-10-CM

## 2019-10-08 DIAGNOSIS — Z7901 Long term (current) use of anticoagulants: Secondary | ICD-10-CM | POA: Diagnosis not present

## 2019-10-08 DIAGNOSIS — F0391 Unspecified dementia with behavioral disturbance: Secondary | ICD-10-CM | POA: Diagnosis not present

## 2019-10-08 DIAGNOSIS — U071 COVID-19: Secondary | ICD-10-CM | POA: Diagnosis not present

## 2019-10-08 DIAGNOSIS — M6281 Muscle weakness (generalized): Secondary | ICD-10-CM | POA: Diagnosis not present

## 2019-10-08 DIAGNOSIS — I5032 Chronic diastolic (congestive) heart failure: Secondary | ICD-10-CM | POA: Diagnosis not present

## 2019-10-08 DIAGNOSIS — I2699 Other pulmonary embolism without acute cor pulmonale: Secondary | ICD-10-CM | POA: Diagnosis not present

## 2019-10-08 DIAGNOSIS — L89302 Pressure ulcer of unspecified buttock, stage 2: Secondary | ICD-10-CM | POA: Diagnosis not present

## 2019-10-08 DIAGNOSIS — E119 Type 2 diabetes mellitus without complications: Secondary | ICD-10-CM | POA: Diagnosis not present

## 2019-10-08 NOTE — Telephone Encounter (Signed)
rx sent to pharmacy by e-script  

## 2019-10-09 ENCOUNTER — Other Ambulatory Visit: Payer: Self-pay | Admitting: Family

## 2019-10-09 ENCOUNTER — Other Ambulatory Visit: Payer: Self-pay | Admitting: Internal Medicine

## 2019-10-09 DIAGNOSIS — M6281 Muscle weakness (generalized): Secondary | ICD-10-CM | POA: Diagnosis not present

## 2019-10-09 DIAGNOSIS — L89302 Pressure ulcer of unspecified buttock, stage 2: Secondary | ICD-10-CM | POA: Diagnosis not present

## 2019-10-09 DIAGNOSIS — N183 Chronic kidney disease, stage 3 unspecified: Secondary | ICD-10-CM

## 2019-10-09 DIAGNOSIS — I2699 Other pulmonary embolism without acute cor pulmonale: Secondary | ICD-10-CM | POA: Diagnosis not present

## 2019-10-09 DIAGNOSIS — Z7901 Long term (current) use of anticoagulants: Secondary | ICD-10-CM | POA: Diagnosis not present

## 2019-10-09 DIAGNOSIS — E119 Type 2 diabetes mellitus without complications: Secondary | ICD-10-CM | POA: Diagnosis not present

## 2019-10-09 DIAGNOSIS — I5032 Chronic diastolic (congestive) heart failure: Secondary | ICD-10-CM | POA: Diagnosis not present

## 2019-10-09 DIAGNOSIS — E1122 Type 2 diabetes mellitus with diabetic chronic kidney disease: Secondary | ICD-10-CM

## 2019-10-09 DIAGNOSIS — F0391 Unspecified dementia with behavioral disturbance: Secondary | ICD-10-CM | POA: Diagnosis not present

## 2019-10-09 NOTE — Telephone Encounter (Signed)
Ok to fill 

## 2019-10-09 NOTE — Telephone Encounter (Signed)
These cannot be filled until he is seen.

## 2019-10-09 NOTE — Telephone Encounter (Signed)
These cannot be filled until he is seen (mail order so takes two weeks to get them anyway and has visit in 3 days).

## 2019-10-09 NOTE — Telephone Encounter (Signed)
Ok to fill? Pt need labs and a OV.

## 2019-10-10 ENCOUNTER — Other Ambulatory Visit: Payer: Self-pay

## 2019-10-10 NOTE — Patient Outreach (Signed)
[facility]) Care Management  [place]  10/10/2019   Iyad Deroo November 25, 1921 [id number]  Subjective: Telephone call to daughter [name].  She reports that her dad is doing ok but she is concerned about his wound to his bottom.  She reports that it has gotten worse fast.  She states that home health is coming to change wound but only once a week due to her schedule. However, she states that now she has requested for twice a week. She states she has help now as her nephew has moved in with them.  She states patient has appointment next week with wound center and appointment with PCP on Friday. She states she needs VA paperwork filled out as she is trying to get help for patient through the [place] at this time. She states that her nephew will be helping to get patient to appointments now.  She is also working on getting ramp set up in order to get patient out of the house easier.  She is aware of [employer] benefit and have been in contact with them for how to get transportation set up.  Discussed patient wound and need for supplements and protein rich diet to assist with wound healing. She verbalized understanding and voices no concerns.     Objective:   Encounter Medications:  Outpatient Encounter Medications as of 10/10/2019  Medication Sig  . ELIQUIS 5 MG TABS tablet Take 1 tablet by mouth twice daily  . albuterol (VENTOLIN HFA) 108 (90 Base) MCG/ACT inhaler Inhale 2 puffs into the lungs every 4 (four) hours as needed for wheezing or shortness of breath. INHALE TWO PUFFS EVERY 4-6 HOURS AS NEEDED FOR DIFFICULTY BREATHING,WHEEZING OR COUGH (Patient taking differently: Inhale 2 puffs into the lungs every 4 (four) hours as needed for wheezing or shortness of breath. )  . Alcohol Swabs (SURE-PREP ALCOHOL PREP) 70 % PADS 1 each by Does not apply route as needed.  [name] amLODipine (NORVASC) 5 MG tablet Take 1 tablet (5 mg total) by mouth daily.  [name] aspirin EC 81 MG tablet Take  1 tablet (81 mg total) by mouth daily.  . Blood Glucose Monitoring Suppl (ACCU-CHEK AVIVA) device Use to test blood sugar three times daily. Dx E11.51  . escitalopram (LEXAPRO) 5 MG tablet Take 1 tablet (5 mg total) by mouth daily.  [name] glucose blood (ACCU-CHEK AVIVA) test strip Use to test blood sugar three times daily. Dx: E11.51  . hydrochlorothiazide (HYDRODIURIL) 25 MG tablet Take 1 tablet (25 mg total) by mouth daily.  . insulin glargine (LANTUS) 100 UNIT/ML injection Inject 7 Units into the skin daily.  . Insulin Pen Needle (B-D UF III MINI PEN NEEDLES) 31G X 5 MM MISC USE AS DIRECTED.  Elmore Guise Devices (ACCU-CHEK SOFTCLIX) lancets Use as instructed with checking blood sugar three times daily. Dx: E11.51  . Loratadine 10 MG CAPS Take 1 capsule (10 mg total) by mouth daily.  [name] losartan (COZAAR) 50 MG tablet Take 1 tablet (50 mg total) by mouth daily.  . Memantine HCl-Donepezil HCl (NAMZARIC) 28-10 MG CP24 Take 1 capsule by mouth daily.  . Multiple Vitamins-Minerals (SENTRY SENIOR) TABS Take 1 tablet by mouth daily.  . simvastatin (ZOCOR) 20 MG tablet Take 1 tablet (20 mg total) by mouth daily.  . tamsulosin (FLOMAX) 0.4 MG CAPS capsule Take 1 capsule (0.4 mg total) by mouth daily after supper.   No facility-administered encounter medications on file as of 10/10/2019.    Functional Status:  In your present state of health, do you have any difficulty performing the following activities: 07/16/2019 07/06/2019  Hearing? N Y  Vision? N N  Difficulty concentrating or making decisions? [name]  Comment Patient needs assistance with all aspects of care. -  Walking or climbing stairs? Y Y  Dressing or bathing? Y Y  Doing errands, shopping? [name]  Preparing Food and eating ? Y -  Comment needs help with all aspects of care. -  Using the Toilet? Y -  In the past six months, have you accidently leaked urine? Y -  Do you have problems with loss of bowel control? Y -  Managing your Medications? Y -   Managing your Finances? Y -  Housekeeping or managing your Housekeeping? Y -  Some recent data might be hidden    Fall/Depression Screening: Fall Risk  10/03/2019 07/18/2019 07/16/2019  Falls in the past year? 0 0 1  Number falls in past yr: 0 0 1  Injury with Fall? 0 0 0  Comment - - -  Risk Factor Category  - - -  Risk for fall due to : - - History of fall(s)  Follow up - - Falls prevention discussed   PHQ 2/9 Scores 10/03/2019 07/16/2019 07/16/2019 01/22/2019 12/01/2018 04/26/2018 07/05/2017  PHQ - 2 Score 0 - - 0 0 0 0  Exception Documentation - Other- indicate reason in comment box Other- indicate reason in comment box - - - -  Not completed - - unable to assess - - - -    Assessment: Daughter continues to manage patient's chronic illnesses. Working with [place] to get patient additional assistance in the home.   Plan:  [facility] CM Care Plan Problem One     Most Recent Value  Care Plan Problem One  Recent Hospitalization COVID  Role Documenting the Problem One  Care Management Telephonic Coordinator  Care Plan for Problem One  Active  [facility] Long Term Goal   Patient will not experience unplanned hospital readmission within the next 31 days  [facility] Long Term Goal Start Date  09/10/19  [facility] Long Term Goal Met Date  10/10/19  [facility] CM Short Term Goal #1   Daughter will be abel to report regular involvement of home health in patient care within 29 days.   [facility] CM Short Term Goal #1 Start Date  09/10/19  [facility] CM Short Term Goal #1 Met Date  10/10/19  [facility] CM Short Term Goal #2   Daughter will be able to report sacral wound improvement within 29 days.    [facility] CM Short Term Goal #2 Start Date  10/10/19  Interventions for Short Term Goal #2  RN CM discussed with daughter wound status.  Patient has appointment with wound clinic next week.    [facility] CM Short Term Goal #3  Daughter will report checking blood sugar at least weekly within 29 days.    [facility] CM Short Term Goal #3 Start Date  10/10/19  Interventions for  Short Tern Goal #3  RN CM discussed blood sugar management.         RN CM will contact again in the month of April and daughter agreeable.

## 2019-10-11 ENCOUNTER — Ambulatory Visit: Payer: Medicare HMO | Admitting: Internal Medicine

## 2019-10-11 ENCOUNTER — Other Ambulatory Visit: Payer: Self-pay

## 2019-10-11 DIAGNOSIS — I10 Essential (primary) hypertension: Secondary | ICD-10-CM

## 2019-10-11 MED ORDER — AMLODIPINE BESYLATE 5 MG PO TABS: 5.0000 mg | ORAL_TABLET | Freq: Every day | ORAL | 0 refills | Status: DC

## 2019-10-11 NOTE — Telephone Encounter (Signed)
Dr. Mariea Clonts said ok to refill at local pharmacy. Karma Ganja that it was important that she make the appointment tomorrow so that Dr. Mariea Clonts could properly assess/treat patient.  She stated she had ramps to get him out of the house, that last week she did not have anyone to help her.

## 2019-10-11 NOTE — Telephone Encounter (Signed)
When Rxs were sent to me, they were going to the mail order pharmacy.  Ok to refill the amlodipine locally.  I MUST see him tomorrow as planned because I cannot provide adequate care without laying eyes on the patient.

## 2019-10-11 NOTE — Telephone Encounter (Signed)
Incoming call received from patients daughter [name]. [name] was upset that patients medications were denied. [name] states patient is completely out of his amlodipine and needs it refilled ASAP to [facility] on file. [name] stated she didn't think patient could just stop medications.   [name] states she can not believe Dr.Reed denied medications and believes she need to change her attitude. [name] thinks this is all an attempt to force her to find a new doctor.    Mary requested a return call when medication is filled.

## 2019-10-11 NOTE — Telephone Encounter (Signed)
[name] called and was near the pharmacy and asked if we were going to call in "pills." I read phone note and sent a refill for 30 tablets with no refills to [facility] [address] and let [name] know that it was being sent.  Her Dr. Cyndi Lennert request I reinforced the importance of him keeping his appointment for tomorrow.  She stated that she would be here.

## 2019-10-11 NOTE — Addendum Note (Signed)
Addended by: Bonney Leitz T on: 10/11/2019 03:35 PM   Modules accepted: Orders

## 2019-10-12 ENCOUNTER — Encounter: Payer: Self-pay | Admitting: Internal Medicine

## 2019-10-12 ENCOUNTER — Ambulatory Visit (INDEPENDENT_AMBULATORY_CARE_PROVIDER_SITE_OTHER): Payer: Medicare HMO | Admitting: Internal Medicine

## 2019-10-12 ENCOUNTER — Telehealth: Payer: Self-pay | Admitting: Internal Medicine

## 2019-10-12 ENCOUNTER — Other Ambulatory Visit: Payer: Self-pay

## 2019-10-12 VITALS — BP 118/58 | HR 87 | Temp 97.5°F | Ht 72.0 in | Wt 168.0 lb

## 2019-10-12 DIAGNOSIS — Z515 Encounter for palliative care: Secondary | ICD-10-CM

## 2019-10-12 DIAGNOSIS — R5381 Other malaise: Secondary | ICD-10-CM

## 2019-10-12 DIAGNOSIS — R6889 Other general symptoms and signs: Secondary | ICD-10-CM | POA: Diagnosis not present

## 2019-10-12 DIAGNOSIS — F0151 Vascular dementia with behavioral disturbance: Secondary | ICD-10-CM | POA: Diagnosis not present

## 2019-10-12 DIAGNOSIS — H6123 Impacted cerumen, bilateral: Secondary | ICD-10-CM | POA: Diagnosis not present

## 2019-10-12 DIAGNOSIS — E1151 Type 2 diabetes mellitus with diabetic peripheral angiopathy without gangrene: Secondary | ICD-10-CM | POA: Diagnosis not present

## 2019-10-12 DIAGNOSIS — L89153 Pressure ulcer of sacral region, stage 3: Secondary | ICD-10-CM | POA: Diagnosis not present

## 2019-10-12 DIAGNOSIS — L89154 Pressure ulcer of sacral region, stage 4: Secondary | ICD-10-CM | POA: Insufficient documentation

## 2019-10-12 DIAGNOSIS — F01518 Vascular dementia, unspecified severity, with other behavioral disturbance: Secondary | ICD-10-CM | POA: Insufficient documentation

## 2019-10-12 NOTE — Telephone Encounter (Signed)
Please be sure we renewed Mr. [name] Rxs and call home health to increase RN visits to twice weekly for stage 3 pressure injury and to begin foley catheter to be changed monthly to help promote wound healing.  If pt pulls out again, ok to d/c.

## 2019-10-12 NOTE — Progress Notes (Signed)
Location:  [facility] clinic Provider: Ave Scharnhorst L. Mariea Clonts, D.O., C.M.D.  Goals of Care:  Advanced Directives 10/12/2019  Does Patient Have a Medical Advance Directive? Yes  Type of Advance Directive [place]  Does patient want to make changes to medical advance directive? No - Patient declined  Copy of [place] in Chart? -  Would patient like information on creating a medical advance directive? -     Chief Complaint  Patient presents with  . Medical Management of Chronic Issues    VA forms filled out     HPI: Patient is a 86 y.o. male seen today for an acute visit for pressure ulcer of sacrum and to fill in [place] aide and attendance form.    They have an appt 3/17 at the wound care center for his pressure injury.  His wound is progressing.  He is getting in home nursing care twice a week and his daughter is checking his dressing which includes packing and a duoderm on top.  She has challenges with offloading pressure for him due to how he will turn back onto his bottom after repositioned with pillows.    His ears were loaded with cerumen and body covered with extremely dry, scaly skin.    He was entirely nonverbal except to say "Umm" once.    Past Medical History:  Diagnosis Date  . Arthritis   . CAD (coronary artery disease)    s/p DES to LAD 06/2013  . Dementia with behavioral disturbance ([place])   . Depression   . Dyslipidemia   . Glaucoma   . Hyperglycemia 04/17/2014  . Hyperlipidemia   . Hypertension   . Stroke ([place])   . Type 2 diabetes mellitus ([place])     Past Surgical History:  Procedure Laterality Date  . CARDIAC CATHETERIZATION  06/2013   DES to LAD by Dr Tamala Julian  . Right Cataract      Allergies  Allergen Reactions  . Lisinopril Cough    Outpatient Encounter Medications as of 10/12/2019  Medication Sig  . albuterol (VENTOLIN HFA) 108 (90 Base) MCG/ACT inhaler Inhale 2 puffs into the lungs every 4 (four) hours as needed for  wheezing or shortness of breath. INHALE TWO PUFFS EVERY 4-6 HOURS AS NEEDED FOR DIFFICULTY BREATHING,WHEEZING OR COUGH  . Alcohol Swabs (SURE-PREP ALCOHOL PREP) 70 % PADS 1 each by Does not apply route as needed.  [name] amLODipine (NORVASC) 5 MG tablet Take 1 tablet (5 mg total) by mouth daily.  [name] aspirin EC 81 MG tablet Take 1 tablet (81 mg total) by mouth daily.  . Blood Glucose Monitoring Suppl (ACCU-CHEK AVIVA) device Use to test blood sugar three times daily. Dx E11.51  . ELIQUIS 5 MG TABS tablet Take 1 tablet by mouth twice daily  . escitalopram (LEXAPRO) 5 MG tablet Take 1 tablet (5 mg total) by mouth daily.  [name] glucose blood (ACCU-CHEK AVIVA) test strip Use to test blood sugar three times daily. Dx: E11.51  . hydrochlorothiazide (HYDRODIURIL) 25 MG tablet Take 1 tablet (25 mg total) by mouth daily.  . insulin glargine (LANTUS) 100 UNIT/ML injection Inject 7 Units into the skin daily.  . Insulin Pen Needle (B-D UF III MINI PEN NEEDLES) 31G X 5 MM MISC USE AS DIRECTED.  Elmore Guise Devices (ACCU-CHEK SOFTCLIX) lancets Use as instructed with checking blood sugar three times daily. Dx: E11.51  . Loratadine 10 MG CAPS Take 1 capsule (10 mg total) by mouth daily.  [name]  losartan (COZAAR) 50 MG tablet Take 1 tablet (50 mg total) by mouth daily.  . Memantine HCl-Donepezil HCl (NAMZARIC) 28-10 MG CP24 Take 1 capsule by mouth daily.  . Multiple Vitamins-Minerals (SENTRY SENIOR) TABS Take 1 tablet by mouth daily.  . simvastatin (ZOCOR) 20 MG tablet Take 1 tablet (20 mg total) by mouth daily.  . tamsulosin (FLOMAX) 0.4 MG CAPS capsule Take 1 capsule (0.4 mg total) by mouth daily after supper.   No facility-administered encounter medications on file as of 10/12/2019.    Review of Systems:  Review of Systems  Reason unable to perform ROS: obtained from daughter, [name].  Constitutional: Positive for malaise/fatigue. Negative for chills and fever.  HENT: Positive for hearing loss.        Cerumen impaction    Gastrointestinal: Negative for constipation.  Genitourinary: Negative for dysuria.       Incontinence, wears depends  Skin:       Stage 3 pressure injury  Psychiatric/Behavioral: Positive for memory loss.    Health Maintenance  Topic Date Due  . OPHTHALMOLOGY EXAM  06/14/2018  . FOOT EXAM  10/05/2018  . HEMOGLOBIN A1C  11/30/2019  . TETANUS/TDAP  07/29/2025  . INFLUENZA VACCINE  Completed  . PNA vac Low Risk Adult  Completed    Physical Exam: Vitals:   10/12/19 1111  BP: (!) 118/58  Pulse: 87  Temp: (!) 97.5 F (36.4 C)  SpO2: 97%  Weight: 168 lb (76.2 kg)  Height: 6' (1.829 m)   Body mass index is 22.78 kg/m. Physical Exam Constitutional:      General: He is not in acute distress.    Appearance: He is ill-appearing. He is not toxic-appearing.     Comments: Frail and chronically ill appearing  HENT:     Head: Normocephalic and atraumatic.     Right Ear: There is impacted cerumen.     Left Ear: There is impacted cerumen.     Ears:     Comments: He only allowed removal of cerumen from one ear Cardiovascular:     Rate and Rhythm: Normal rate and regular rhythm.  Pulmonary:     Effort: Pulmonary effort is normal.     Breath sounds: Normal breath sounds. No wheezing, rhonchi or rales.  Abdominal:     General: Bowel sounds are normal.  Neurological:     Mental Status: He is alert.     Motor: Weakness present.     Gait: Gait abnormal.     Comments: Nonverbal, required two CMAs, daughter and gait belt to transfer patient to the exam table  Psychiatric:        Mood and Affect: Mood normal.     Labs reviewed: Basic Metabolic Panel: Recent Labs    06/01/19 2202 06/02/19 0250 07/06/19 0204 07/07/19 0033 07/09/19 0335  NA  --    < > 143 144 143  K  --    < > 4.9 4.6 4.7  CL  --    < > 105 105 103  CO2  --    < > 30 29 30   GLUCOSE  --    < > 193* 226* 91  BUN  --    < > 29* 36* 35*  CREATININE  --    < > 1.29* 1.12 0.98  CALCIUM  --    < > 8.9 8.9 9.3   MG  --   --   --  2.1  --   TSH 1.527  --   --   --   --    < > =  values in this interval not displayed.   Liver Function Tests: Recent Labs    07/06/19 0204 07/07/19 0033 07/09/19 0335  AST 35 47* 94*  ALT 30 38 107*  ALKPHOS 69 59 62  BILITOT 0.7 0.2* 0.3  PROT 6.5 6.2* 6.5  ALBUMIN 3.2* 2.9* 3.3*   No results for input(s): LIPASE, AMYLASE in the last 8760 hours. Recent Labs    06/01/19 2202  AMMONIA 29   CBC: Recent Labs    07/05/19 1536 07/06/19 0204 07/07/19 0033  WBC 3.8* 4.0 2.6*  NEUTROABS 2.2 2.9 1.9  HGB 9.7* 10.5* 9.0*  HCT 31.4* 34.1* 29.1*  MCV 96.3 96.6 95.4  PLT 170 162 179   Lipid Panel: Recent Labs    12/01/18 1032 06/02/19 0250 07/05/19 1536  CHOL 147 134  --   HDL 37* 48  --   LDLCALC 89 74  --   TRIG 117 61 126  CHOLHDL 4.0 2.8  --    Lab Results  Component Value Date   HGBA1C 6.3 (H) 06/02/2019   Assessment/Plan 1. Pressure injury of sacral region, stage 3 ([place]) -requested for home health RN to come out twice a week -[name] reports changing dressings as directed (has packing and then duoderm over it) -has some granulation tissue inside over muscle tissue that's visible -has some tunneling/undermining that is concerning but no probable bone -they do have the air mattress now on his hospital bed and [name] reports that with the help of her nephew, they can help move him regularly and get him up in a chair on the front porch so he can be in the sun [name] again requested we try a foley for him to keep his wound dry -risks of UTI explained and need for at least 6-8 glasses of water per day  -she continued to want the foley so order was placed to home health along with order for twice weekly visits -wound had necrotic odor/colonization but did not appear infected  2. Vascular dementia with behavior disturbance ([place]) -appears to be at end stages, requires assist with all ADLs and ideally would be hoyer lift as today could not even  stand-pivot-sit--required 2-3 to transfer him  3. End of life care -explained to [facility] that his minimal speech, nonhealing sacral wound and advanced age all indicate that he is nearing the end of his life--she looked at me when I said it -she has vehemently denied palliative care and hospice referrals when previously offered  4. Type 2 diabetes, controlled, with peripheral circulatory disorder ([place]) -control has not been a problem though his daughter reports he eats well Lab Results  Component Value Date   HGBA1C 6.3 (H) 06/02/2019  -is on low dose of lantus therapy daily -remains on several meds which probably do not portend benefit due to his prognosis:  Zocor, namzaric, asa and could argue most of his other pills  5. Bilateral hearing loss due to cerumen impaction -only one ear could be flushed with warm water and peroxide prior to pt getting agitated and upset when second ear attempted  6. Debility -severe at his advanced age and degree of dementia -requires 24x7 supervision and care  -aide and attendance form completed for VA benefit today--form took 20 mins to complete in addition to completing the visit itself   Next appt:  4 wk phone call f/u  Karly Pitter L. Jennavieve Arrick, D.O. [facility] [address], [place] [address] Cell Phone (Mon-Fri  8am-5pm):  [phone] On Call:  [phone] & follow prompts after 5pm & weekends Office Phone:  [phone] Office Fax:  [phone]

## 2019-10-16 ENCOUNTER — Other Ambulatory Visit: Payer: Self-pay

## 2019-10-16 ENCOUNTER — Encounter (HOSPITAL_BASED_OUTPATIENT_CLINIC_OR_DEPARTMENT_OTHER): Payer: Medicare HMO | Admitting: Internal Medicine

## 2019-10-16 ENCOUNTER — Telehealth: Payer: Self-pay

## 2019-10-16 NOTE — Telephone Encounter (Signed)
Talk to [name] his daughter and she said all the meds she requested has been filled, she is waiting on [employer] to deliver.

## 2019-10-16 NOTE — Telephone Encounter (Signed)
Verbal orders per Dr. Sharol Harness RN visits to twice weekly for stage 3 pressure injury and begin foley catheter to be changed monthly to help promote wound healing. If patient pulls it out again it is ok to D/C.  Faxed 10/15/2019

## 2019-10-17 ENCOUNTER — Other Ambulatory Visit: Payer: Self-pay

## 2019-10-17 ENCOUNTER — Encounter (HOSPITAL_BASED_OUTPATIENT_CLINIC_OR_DEPARTMENT_OTHER): Payer: Medicare HMO | Attending: Internal Medicine | Admitting: Physician Assistant

## 2019-10-17 DIAGNOSIS — L89154 Pressure ulcer of sacral region, stage 4: Secondary | ICD-10-CM | POA: Insufficient documentation

## 2019-10-17 DIAGNOSIS — Z888 Allergy status to other drugs, medicaments and biological substances status: Secondary | ICD-10-CM | POA: Diagnosis not present

## 2019-10-17 DIAGNOSIS — I2510 Atherosclerotic heart disease of native coronary artery without angina pectoris: Secondary | ICD-10-CM | POA: Diagnosis not present

## 2019-10-17 DIAGNOSIS — M1990 Unspecified osteoarthritis, unspecified site: Secondary | ICD-10-CM | POA: Insufficient documentation

## 2019-10-17 DIAGNOSIS — F0150 Vascular dementia without behavioral disturbance: Secondary | ICD-10-CM | POA: Insufficient documentation

## 2019-10-17 DIAGNOSIS — H409 Unspecified glaucoma: Secondary | ICD-10-CM | POA: Diagnosis not present

## 2019-10-17 DIAGNOSIS — E11621 Type 2 diabetes mellitus with foot ulcer: Secondary | ICD-10-CM | POA: Diagnosis not present

## 2019-10-17 DIAGNOSIS — L97422 Non-pressure chronic ulcer of left heel and midfoot with fat layer exposed: Secondary | ICD-10-CM | POA: Diagnosis not present

## 2019-10-17 DIAGNOSIS — I10 Essential (primary) hypertension: Secondary | ICD-10-CM | POA: Insufficient documentation

## 2019-10-17 DIAGNOSIS — R6889 Other general symptoms and signs: Secondary | ICD-10-CM | POA: Diagnosis not present

## 2019-10-17 DIAGNOSIS — J449 Chronic obstructive pulmonary disease, unspecified: Secondary | ICD-10-CM | POA: Diagnosis not present

## 2019-10-17 DIAGNOSIS — L97522 Non-pressure chronic ulcer of other part of left foot with fat layer exposed: Secondary | ICD-10-CM | POA: Insufficient documentation

## 2019-10-17 DIAGNOSIS — E785 Hyperlipidemia, unspecified: Secondary | ICD-10-CM | POA: Diagnosis not present

## 2019-10-17 DIAGNOSIS — Z87891 Personal history of nicotine dependence: Secondary | ICD-10-CM | POA: Insufficient documentation

## 2019-10-17 DIAGNOSIS — Z8673 Personal history of transient ischemic attack (TIA), and cerebral infarction without residual deficits: Secondary | ICD-10-CM | POA: Diagnosis not present

## 2019-10-18 DIAGNOSIS — I2699 Other pulmonary embolism without acute cor pulmonale: Secondary | ICD-10-CM | POA: Diagnosis not present

## 2019-10-18 DIAGNOSIS — L89302 Pressure ulcer of unspecified buttock, stage 2: Secondary | ICD-10-CM | POA: Diagnosis not present

## 2019-10-18 DIAGNOSIS — M6281 Muscle weakness (generalized): Secondary | ICD-10-CM | POA: Diagnosis not present

## 2019-10-18 DIAGNOSIS — I5032 Chronic diastolic (congestive) heart failure: Secondary | ICD-10-CM | POA: Diagnosis not present

## 2019-10-18 DIAGNOSIS — Z7901 Long term (current) use of anticoagulants: Secondary | ICD-10-CM | POA: Diagnosis not present

## 2019-10-18 DIAGNOSIS — E119 Type 2 diabetes mellitus without complications: Secondary | ICD-10-CM | POA: Diagnosis not present

## 2019-10-18 DIAGNOSIS — F0391 Unspecified dementia with behavioral disturbance: Secondary | ICD-10-CM | POA: Diagnosis not present

## 2019-10-22 DIAGNOSIS — I2699 Other pulmonary embolism without acute cor pulmonale: Secondary | ICD-10-CM | POA: Diagnosis not present

## 2019-10-22 DIAGNOSIS — L89302 Pressure ulcer of unspecified buttock, stage 2: Secondary | ICD-10-CM | POA: Diagnosis not present

## 2019-10-22 DIAGNOSIS — M6281 Muscle weakness (generalized): Secondary | ICD-10-CM | POA: Diagnosis not present

## 2019-10-22 DIAGNOSIS — F0391 Unspecified dementia with behavioral disturbance: Secondary | ICD-10-CM | POA: Diagnosis not present

## 2019-10-22 DIAGNOSIS — Z7901 Long term (current) use of anticoagulants: Secondary | ICD-10-CM | POA: Diagnosis not present

## 2019-10-22 DIAGNOSIS — I5032 Chronic diastolic (congestive) heart failure: Secondary | ICD-10-CM | POA: Diagnosis not present

## 2019-10-22 DIAGNOSIS — E119 Type 2 diabetes mellitus without complications: Secondary | ICD-10-CM | POA: Diagnosis not present

## 2019-10-22 NOTE — Progress Notes (Signed)
[name], [name] ([id number]) Visit Report for 10/17/2019 Abuse/Suicide Risk Screen Details Patient Name: Date of Service: [name], [name] 10/17/2019 1:15 PM Medical Record [id number] Patient Account Number: [id number] Date of Birth/Sex: Treating RN: 10-31-1921 (86 y.o. Male) Levan Hurst Primary Care Raekwan Spelman: Hollace Kinnier Other Clinician: Referring Emmalyne Giacomo: Treating Veralyn Lopp/Extender:Stone III, Stacey Drain, Tiffany Weeks in Treatment: 0 Abuse/Suicide Risk Screen Items Answer ABUSE RISK SCREEN: Has anyone close to you tried to hurt or harm you recentlyo No Do you feel uncomfortable with anyone in your familyo No Has anyone forced you do things that you didnt want to doo No Electronic Signature(s) Signed: 10/22/2019 5:18:21 PM By: Levan Hurst RN, BSN Entered By: Levan Hurst on 10/17/2019 13:34:26 -------------------------------------------------------------------------------- Activities of Daily Living Details Patient Name: Date of Service: [name], [name] 10/17/2019 1:15 PM Medical Record [id number] Patient Account Number: [id number] Date of Birth/Sex: Treating RN: 1921/12/16 (86 y.o. Male) Levan Hurst Primary Care Andreka Stucki: Hollace Kinnier Other Clinician: Referring Wymon Swaney: Treating Emery Binz/Extender:Stone III, Stacey Drain, Tiffany Weeks in Treatment: 0 Activities of Daily Living Items Answer Activities of Daily Living (Please select one for each item) Drive Automobile Not Able Take Medications Need Assistance Use Telephone Not Able Care for Appearance Need Assistance Use Toilet Need Assistance Bath / Shower Need Assistance Dress Self Need Assistance Feed Self Need Assistance Walk Not Able Get In / Out Bed Need Assistance Housework Not Able Prepare Meals Not Able Handle Money Not Able Shop for Self Not Able Electronic Signature(s) Signed: 10/22/2019 5:18:21 PM By: Levan Hurst RN, BSN Entered By: Levan Hurst on 10/17/2019  13:34:59 -------------------------------------------------------------------------------- Education Screening Details Patient Name: Date of Service: [name], [name] 10/17/2019 1:15 PM Medical Record [id number] Patient Account Number: [id number] Date of Birth/Sex: Treating RN: Jan 16, 1922 (86 y.o. Male) Levan Hurst Primary Care Susanna Benge: Hollace Kinnier Other Clinician: Referring Kanon Colunga: Treating Adrine Hayworth/Extender:Stone III, Stacey Drain, Tiffany Weeks in Treatment: 0 Primary Learner Assessed: Caregiver daughter Reason Patient is not Primary Learner: patient has Dementia Learning Preferences/Education Level/Primary Language Learning Preference: Explanation, Demonstration, Printed Material Highest Education Level: High School Preferred Language: English Cognitive Barrier Language Barrier: No Translator Needed: No Memory Deficit: No Emotional Barrier: No Cultural/Religious Beliefs Affecting Medical Care: No Physical Barrier Impaired Vision: No Impaired Hearing: No Decreased Hand dexterity: No Knowledge/Comprehension Knowledge Level: High Comprehension Level: High Ability to understand written High instructions: Ability to understand verbal High instructions: Motivation Anxiety Level: Calm Cooperation: Cooperative Education Importance: Acknowledges Need Interest in Health Problems: Asks Questions Perception: Coherent Willingness to Engage in Self- High Management Activities: Readiness to Engage in Self- High Management Activities: Electronic Signature(s) Signed: 10/22/2019 5:18:21 PM By: Levan Hurst RN, BSN Entered By: Levan Hurst on 10/17/2019 13:35:26 -------------------------------------------------------------------------------- Fall Risk Assessment Details Patient Name: Date of Service: [name], [name] 10/17/2019 1:15 PM Medical Record [id number] Patient Account Number: [id number] Date of Birth/Sex: Treating RN: Oct 15, 1921 (86 y.o. Male) Levan Hurst Primary Care Jennie Hannay: Hollace Kinnier Other Clinician: Referring Quantasia Stegner: Treating Jamier Urbas/Extender:Stone III, Stacey Drain, Tiffany Weeks in Treatment: 0 Fall Risk Assessment Items Have you had 2 or more falls in the last 12 monthso 0 No Have you had any fall that resulted in injury in the last 12 monthso 0 No FALLS RISK SCREEN History of falling - immediate or within 3 months 0 No Secondary diagnosis (Do you have 2 or more medical diagnoseso) 15 Yes Ambulatory aid None/bed rest/wheelchair/nurse 0 Yes Crutches/cane/walker 0 No Furniture 0 No Intravenous therapy Access/Saline/Heparin Lock 0 No Weak (short steps with or without shuffle, stooped but able to lift head 0 No  while walking, may seek support from furniture) Impaired (short steps with shuffle, may have difficulty arising from chair, 20 Yes head down, impaired balance) Mental Status Oriented to own ability 0 No Overestimates or forgets limitations 15 Yes Risk Level: Medium Risk Score: 50 Electronic Signature(s) Signed: 10/22/2019 5:18:21 PM By: Levan Hurst RN, BSN Entered By: Levan Hurst on 10/17/2019 13:36:30 -------------------------------------------------------------------------------- Foot Assessment Details Patient Name: Date of Service: [name], [name] 10/17/2019 1:15 PM Medical Record [id number] Patient Account Number: [id number] Date of Birth/Sex: Treating RN: 1922/06/12 (86 y.o. Male) Deon Pilling Primary Care Marifer Hurd: Hollace Kinnier Other Clinician: Referring Alva Broxson: Treating [place] Want/Extender:Stone III, Stacey Drain, Tiffany Weeks in Treatment: 0 Foot Assessment Items [x]  Unable to perform due to altered mental status Site Locations + = Sensation present, - = Sensation absent, C = Callus, U = Ulcer R = Redness, W = Warmth, M = Maceration, PU = Pre-ulcerative lesion F = Fissure, S = Swelling, D = Dryness Assessment Right: Left: Other Deformity: No No Prior Foot Ulcer: No No Prior  Amputation: No No Charcot Joint: No No Ambulatory Status: Non-ambulatory Assistance Device: Wheelchair Gait: Administrator, arts) Signed: 10/17/2019 4:31:34 PM By: Deon Pilling Entered By: Deon Pilling on 10/17/2019 14:36:08 -------------------------------------------------------------------------------- Nutrition Risk Screening Details Patient Name: Date of Service: [name], [name] 10/17/2019 1:15 PM Medical Record [id number] Patient Account Number: [id number] Date of Birth/Sex: Treating RN: 07/13/1922 (86 y.o. Male) Levan Hurst Primary Care Talya Quain: Hollace Kinnier Other Clinician: Referring Casten Floren: Treating Jill Stopka/Extender:Stone III, Stacey Drain, Tiffany Weeks in Treatment: 0 Height (in): 71 Weight (lbs): 175 Body Mass Index (BMI): 24.4 Nutrition Risk Screening Items Score Screening NUTRITION RISK SCREEN: I have an illness or condition that made me change the kind and/or 2 Yes amount of food I eat I eat fewer than two meals per day 0 No I eat few fruits and vegetables, or milk products 0 No I have three or more drinks of beer, liquor or wine almost every day 0 No I have tooth or mouth problems that make it hard for me to eat 0 No I don't always have enough money to buy the food I need 0 No I eat alone most of the time 0 No I take three or more different prescribed or over-the-counter drugs a day 1 Yes 0 No Without wanting to, I have lost or gained 10 pounds in the last six months I am not always physically able to shop, cook and/or feed myself 2 Yes Nutrition Protocols Good Risk Protocol Provide education on Moderate Risk Protocol 0 nutrition High Risk Proctocol Risk Level: Moderate Risk Score: 5 Electronic Signature(s) Signed: 10/22/2019 5:18:21 PM By: Levan Hurst RN, BSN Entered By: Levan Hurst on 10/17/2019 13:36:42

## 2019-10-22 NOTE — Progress Notes (Signed)
RAYCEN, LIBONATI ([id number]) Visit Report for 10/17/2019 Chief Complaint Document Details Patient Name: Date of Service: [name], [name] 10/17/2019 1:15 PM Medical Record [id number] Patient Account Number: [id number] Date of Birth/Sex: Treating RN: [birth year] (86 y.o. Male) Baruch Gouty Primary Care Provider: Hollace Kinnier Other Clinician: Referring Provider: Treating Provider/Extender:Stone III, Stacey Drain, Tiffany Weeks in Treatment: 0 Information Obtained from: Patient Chief Complaint Sacral and Left Heel Ulcer Electronic Signature(s) Signed: 10/17/2019 1:53:17 PM By: Worthy Keeler PA-C Entered By: Worthy Keeler on 10/17/2019 13:53:17 -------------------------------------------------------------------------------- Debridement Details Patient Name: Date of Service: [name], [name] 10/17/2019 1:15 PM Medical Record [id number] Patient Account Number: [id number] Date of Birth/Sex: 01-Feb-1922 (86 y.o. Male) Treating RN: Baruch Gouty Primary Care Provider: Hollace Kinnier Other Clinician: Referring Provider: Treating Provider/Extender:Stone III, Stacey Drain, Tiffany Weeks in Treatment: 0 Debridement Performed for Wound #2 Left Calcaneus Assessment: Performed By: Physician Worthy Keeler, PA Debridement Type: Debridement Severity of Tissue Pre Fat layer exposed Debridement: Level of Consciousness (Pre- Responds to Verbal Stimuli procedure): Pre-procedure Verification/Time Out Taken: Yes - 13:55 Start Time: 13:56 Pain Control: Lidocaine 4% Topical Solution Total Area Debrided (L x W): 2 (cm) x 2 (cm) = 4 (cm) Tissue and other material Viable, Non-Viable, Eschar, Slough, Subcutaneous, Slough debrided: Level: Skin/Subcutaneous Tissue Debridement Description: Excisional Instrument: Curette Bleeding: Minimum Hemostasis Achieved: Pressure End Time: 14:01 Procedural Pain: 0 Post Procedural Pain: 0 Response to Treatment: Procedure was tolerated well Level of  Consciousness Responds to Verbal Stimuli (Post-procedure): Post Debridement Measurements of Total Wound Length: (cm) 3 Width: (cm) 3.5 Depth: (cm) 0.1 Volume: (cm) 0.825 Character of Wound/Ulcer Post Improved Debridement: Severity of Tissue Post Debridement: Fat layer exposed Post Procedure Diagnosis Same as Pre-procedure Electronic Signature(s) Signed: 10/17/2019 4:26:16 PM By: Worthy Keeler PA-C Signed: 10/17/2019 4:42:28 PM By: Baruch Gouty RN, BSN Entered By: Baruch Gouty on 10/17/2019 14:00:01 -------------------------------------------------------------------------------- Debridement Details Patient Name: Date of Service: [name], [name] 10/17/2019 1:15 PM Medical Record [id number] Patient Account Number: [id number] Date of Birth/Sex: 16-Jul-1922 (86 y.o. Male) Treating RN: Baruch Gouty Primary Care Provider: Hollace Kinnier Other Clinician: Referring Provider: Treating Provider/Extender:Stone III, Stacey Drain, Tiffany Weeks in Treatment: 0 Debridement Performed for Wound #1 Sacrum Assessment: Performed By: Physician Worthy Keeler, PA Debridement Type: Debridement Level of Consciousness (Pre- Responds to Verbal Stimuli procedure): Pre-procedure Verification/Time Out Taken: Yes - 13:55 Start Time: 14:02 Pain Control: Lidocaine 4% Topical Solution Total Area Debrided (L x W): 3 (cm) x 1 (cm) = 3 (cm) Tissue and other material Viable, Non-Viable, Slough, Subcutaneous, Slough debrided: Level: Skin/Subcutaneous Tissue Debridement Description: Excisional Instrument: Forceps, Scissors Bleeding: Minimum Hemostasis Achieved: Pressure End Time: 14:04 Procedural Pain: 0 Post Procedural Pain: 0 Response to Treatment: Procedure was tolerated well Level of Consciousness Responds to Verbal Stimuli (Post-procedure): Post Debridement Measurements of Total Wound Length: (cm) 3.7 Stage: Category/Stage IV Width: (cm) 3 Depth: (cm) 1.7 Volume: (cm)  14.82 Character of Wound/Ulcer Post Improved Debridement: Post Procedure Diagnosis Same as Pre-procedure Electronic Signature(s) Signed: 10/17/2019 4:26:16 PM By: Worthy Keeler PA-C Signed: 10/17/2019 4:42:28 PM By: Baruch Gouty RN, BSN Entered By: Baruch Gouty on 10/17/2019 14:04:41 -------------------------------------------------------------------------------- HPI Details Patient Name: Date of Service: [name], [name] 10/17/2019 1:15 PM Medical Record [id number] Patient Account Number: [id number] Date of Birth/Sex: Treating RN: Feb 29, 1922 (86 y.o. Male) Baruch Gouty Primary Care Provider: Hollace Kinnier Other Clinician: Referring Provider: Treating Provider/Extender:Stone III, Stacey Drain, Tiffany Weeks in Treatment: 0 History of Present Illness HPI Description: 10/17/2019 upon evaluation today patient presents for initial inspection here  in our clinic concerning issues that has been having with a wound in the sacral region as well as the left heel. Fortunately neither appears to be overtly and significantly infected there is some necrotic tissue noted at both locations however that is going to be required to be addressed. He does have a history of diabetes mellitus type 2, vascular dementia, and hypertension. The patient did have a stroke in November 2020 unfortunately he has not been the same since that time according to his daughter. Prior to that he was taking care of himself now he is 100% dependent. He is at home with his daughter who is present during the office visit today as well that is where I get the majority of the history from at this point. Patient does have a history of hypertension as well which does not appear to be too out of control today which is good news. He does have a hemoglobin A1c of 6.05 May 2019. They currently have been using a calcium alginate dressing to the sacrum and it sounds like Skin-Prep and a foam on the heel. Electronic  Signature(s) Signed: 10/17/2019 2:11:56 PM By: Worthy Keeler PA-C Entered By: Worthy Keeler on 10/17/2019 14:11:55 -------------------------------------------------------------------------------- Physical Exam Details Patient Name: Date of Service: [name], [name] 10/17/2019 1:15 PM Medical Record [id number] Patient Account Number: [id number] Date of Birth/Sex: Treating RN: 1922-06-19 (86 y.o. Male) Baruch Gouty Primary Care Provider: Hollace Kinnier Other Clinician: Referring Provider: Treating Provider/Extender:Stone III, Stacey Drain, Tiffany Weeks in Treatment: 0 Constitutional Well-nourished and well-hydrated in no acute distress. Respiratory normal breathing without difficulty. Psychiatric this patient is able to make decisions and demonstrates good insight into disease process. Alert and Oriented x 3. pleasant and cooperative. Notes Upon inspection today patient's wound bed actually showed signs of good granulation at this point. Fortunately there is no evidence of active infection which is great news. No fevers, chills, nausea, vomiting, or diarrhea. I did have to perform sharp debridement today at both wound locations in order to clear away necrotic debris from the surface of the wounds. Patient tolerated this without any pain at either site today. Post debridement both appear to be doing significantly better. Electronic Signature(s) Signed: 10/17/2019 2:12:35 PM By: Worthy Keeler PA-C Entered By: Worthy Keeler on 10/17/2019 14:12:35 -------------------------------------------------------------------------------- Physician Orders Details Patient Name: Date of Service: [name], [name] 10/17/2019 1:15 PM Medical Record [id number] Patient Account Number: [id number] Date of Birth/Sex: Treating RN: 1922-04-18 (86 y.o. Male) Baruch Gouty Primary Care Provider: Hollace Kinnier Other Clinician: Referring Provider: Treating Provider/Extender:Stone III, Stacey Drain,  Tiffany Weeks in Treatment: 0 Verbal / Phone Orders: No Diagnosis Coding ICD-10 Coding Code Description L89.154 Pressure ulcer of sacral region, stage 4 E11.621 Type 2 diabetes mellitus with foot ulcer L97.522 Non-pressure chronic ulcer of other part of left foot with fat layer exposed F01.50 Vascular dementia without behavioral disturbance I10 Essential (primary) hypertension Follow-up Appointments Return Appointment in 2 weeks. Dressing Change Frequency Wound #1 Sacrum Change dressing every day. Wound #2 Left Calcaneus Change dressing three times week. Skin Barriers/Peri-Wound Care Wound #1 Sacrum Skin Prep - to periwound Wound Cleansing Wound #1 Sacrum Clean wound with Wound Cleanser Wound #2 Left Calcaneus Clean wound with Wound Cleanser Primary Wound Dressing Wound #1 Sacrum Other: - Vashe moistened gauze packing (use anasepr in clinic) Wound #2 Left Calcaneus Hydrofera Blue - ready Secondary Dressing Wound #1 Sacrum ABD pad - secured with tegaderm to keep dressing from getting soiled Wound #2 Left Calcaneus Kerlix/Rolled Gauze  Dry Gauze Heel Cup Off-Loading Turn and reposition every 2 hours Other: - float heels with pillows under calves while in bed [place] [facility] for wound care. - [facility] Electronic Signature(s) Signed: 10/17/2019 4:26:16 PM By: Worthy Keeler PA-C Signed: 10/17/2019 4:42:28 PM By: Baruch Gouty RN, BSN Entered By: Baruch Gouty on 10/17/2019 14:16:37 -------------------------------------------------------------------------------- Problem List Details Patient Name: Date of Service: [name], [name] 10/17/2019 1:15 PM Medical Record [id number] Patient Account Number: [id number] Date of Birth/Sex: Treating RN: [birth year] (86 y.o. Male) Baruch Gouty Primary Care Provider: Hollace Kinnier Other Clinician: Referring Provider: Treating Provider/Extender:Stone III, Stacey Drain, Tiffany Weeks in  Treatment: 0 Active Problems ICD-10 Evaluated Encounter Code Description Active Date Today Diagnosis L89.154 Pressure ulcer of sacral region, stage 4 10/17/2019 No Yes E11.621 Type 2 diabetes mellitus with foot ulcer 10/17/2019 No Yes L97.522 Non-pressure chronic ulcer of other part of left foot 10/17/2019 No Yes with fat layer exposed F01.50 Vascular dementia without behavioral disturbance 10/17/2019 No Yes I10 Essential (primary) hypertension 10/17/2019 No Yes Inactive Problems Resolved Problems Electronic Signature(s) Signed: 10/17/2019 1:52:48 PM By: Worthy Keeler PA-C Entered By: Worthy Keeler on 10/17/2019 13:52:47 -------------------------------------------------------------------------------- Progress Note Details Patient Name: Date of Service: [name], [name] 10/17/2019 1:15 PM Medical Record [id number] Patient Account Number: [id number] Date of Birth/Sex: Treating RN: 1922-07-03 (86 y.o. Male) Baruch Gouty Primary Care Provider: Hollace Kinnier Other Clinician: Referring Provider: Treating Provider/Extender:Stone III, Stacey Drain, Tiffany Weeks in Treatment: 0 Subjective Chief Complaint Information obtained from Patient Sacral and Left Heel Ulcer History of Present Illness (HPI) 10/17/2019 upon evaluation today patient presents for initial inspection here in our clinic concerning issues that has been having with a wound in the sacral region as well as the left heel. Fortunately neither appears to be overtly and significantly infected there is some necrotic tissue noted at both locations however that is going to be required to be addressed. He does have a history of diabetes mellitus type 2, vascular dementia, and hypertension. The patient did have a stroke in November 2020 unfortunately he has not been the same since that time according to his daughter. Prior to that he was taking care of himself now he is 100% dependent. He is at home with his daughter who is present  during the office visit today as well that is where I get the majority of the history from at this point. Patient does have a history of hypertension as well which does not appear to be too out of control today which is good news. He does have a hemoglobin A1c of 6.05 May 2019. They currently have been using a calcium alginate dressing to the sacrum and it sounds like Skin-Prep and a foam on the heel. Patient History Allergies lisinopril (Severity: Moderate, Reaction: cough) Family History Unknown History. Social History Former smoker - quit in 1970s, Marital Status - Widowed, Alcohol Use - Never, Drug Use - No History, Caffeine Use - Never. Medical History Eyes Patient has history of Glaucoma Respiratory Patient has history of Chronic Obstructive Pulmonary Disease (COPD) Cardiovascular Patient has history of Coronary Artery Disease, Hypertension Endocrine Patient has history of Type II Diabetes Musculoskeletal Patient has history of Osteoarthritis Neurologic Patient has history of Dementia Patient is treated with Insulin. Blood sugar is tested. Medical And Surgical History Notes Cardiovascular CVA Nov 2020, Hyperlipidemia Review of Systems (ROS) Constitutional Symptoms (General Health) Denies complaints or symptoms of Fatigue, Fever, Chills, Marked Weight Change. Ear/Nose/Mouth/Throat Denies complaints or symptoms of Chronic sinus  problems or rhinitis. Respiratory Denies complaints or symptoms of Chronic or frequent coughs, Shortness of Breath. Cardiovascular Denies complaints or symptoms of Chest pain. Gastrointestinal Denies complaints or symptoms of Frequent diarrhea, Nausea, Vomiting. Genitourinary Denies complaints or symptoms of Frequent urination. Integumentary (Skin) Complains or has symptoms of Wounds - wound on sacrum and left heel. Psychiatric Denies complaints or symptoms of Claustrophobia, Suicidal. Objective Constitutional Well-nourished and  well-hydrated in no acute distress. Vitals Time Taken: 1:09 PM, Height: 71 in, Source: Stated, Weight: 175 lbs, Source: Stated, BMI: 24.4, Temperature: 97.7 F, Pulse: 84 bpm, Respiratory Rate: 16 breaths/min, Blood Pressure: 115/46 mmHg, Capillary Blood Glucose: 112 mg/dl. General Notes: glucose per pt report Respiratory normal breathing without difficulty. Psychiatric this patient is able to make decisions and demonstrates good insight into disease process. Alert and Oriented x 3. pleasant and cooperative. General Notes: Upon inspection today patient's wound bed actually showed signs of good granulation at this point. Fortunately there is no evidence of active infection which is great news. No fevers, chills, nausea, vomiting, or diarrhea. I did have to perform sharp debridement today at both wound locations in order to clear away necrotic debris from the surface of the wounds. Patient tolerated this without any pain at either site today. Post debridement both appear to be doing significantly better. Integumentary (Hair, Skin) Wound #1 status is Open. Original cause of wound was Pressure Injury. The wound is located on the Sacrum. The wound measures 3.7cm length x 3cm width x 1.7cm depth; 8.718cm^2 area and 14.82cm^3 volume. There is bone, muscle, and Fat Layer (Subcutaneous Tissue) Exposed exposed. There is no tunneling or undermining noted. There is a medium amount of serosanguineous drainage noted. The wound margin is well defined and not attached to the wound base. There is medium (34-66%) red, pink granulation within the wound bed. There is a medium (34-66%) amount of necrotic tissue within the wound bed including Eschar and Adherent Slough. Wound #2 status is Open. Original cause of wound was Pressure Injury. The wound is located on the Left Calcaneus. The wound measures 3cm length x 3.5cm width x 0.1cm depth; 8.247cm^2 area and 0.825cm^3 volume. There is Fat Layer (Subcutaneous  Tissue) Exposed exposed. There is no tunneling or undermining noted. There is a medium amount of serosanguineous drainage noted. The wound margin is flat and intact. There is large (67-100%) pink granulation within the wound bed. There is a small (1-33%) amount of necrotic tissue within the wound bed including Eschar and Adherent Slough. Assessment Active Problems ICD-10 Pressure ulcer of sacral region, stage 4 Type 2 diabetes mellitus with foot ulcer Non-pressure chronic ulcer of other part of left foot with fat layer exposed Vascular dementia without behavioral disturbance Essential (primary) hypertension Procedures Wound #1 Pre-procedure diagnosis of Wound #1 is a Pressure Ulcer located on the Sacrum . There was a Excisional Skin/Subcutaneous Tissue Debridement with a total area of 3 sq cm performed by Worthy Keeler, PA. With the following instrument(s): Forceps, and Scissors to remove Viable and Non-Viable tissue/material. Material removed includes Subcutaneous Tissue and Slough and after achieving pain control using Lidocaine 4% Topical Solution. No specimens were taken. A time out was conducted at 13:55, prior to the start of the procedure. A Minimum amount of bleeding was controlled with Pressure. The procedure was tolerated well with a pain level of 0 throughout and a pain level of 0 following the procedure. Post Debridement Measurements: 3.7cm length x 3cm width x 1.7cm depth; 14.82cm^3 volume. Post debridement Stage noted  as Category/Stage IV. Character of Wound/Ulcer Post Debridement is improved. Post procedure Diagnosis Wound #1: Same as Pre-Procedure Wound #2 Pre-procedure diagnosis of Wound #2 is a Diabetic Wound/Ulcer of the Lower Extremity located on the Left Calcaneus .Severity of Tissue Pre Debridement is: Fat layer exposed. There was a Excisional Skin/Subcutaneous Tissue Debridement with a total area of 4 sq cm performed by Worthy Keeler, PA. With the following  instrument(s): Curette to remove Viable and Non-Viable tissue/material. Material removed includes Eschar, Subcutaneous Tissue, and Slough after achieving pain control using Lidocaine 4% Topical Solution. No specimens were taken. A time out was conducted at 13:55, prior to the start of the procedure. A Minimum amount of bleeding was controlled with Pressure. The procedure was tolerated well with a pain level of 0 throughout and a pain level of 0 following the procedure. Post Debridement Measurements: 3cm length x 3.5cm width x 0.1cm depth; 0.825cm^3 volume. Character of Wound/Ulcer Post Debridement is improved. Severity of Tissue Post Debridement is: Fat layer exposed. Post procedure Diagnosis Wound #2: Same as Pre-Procedure Plan Follow-up Appointments: Return Appointment in 2 weeks. Dressing Change Frequency: Wound #2 Left Calcaneus: Change dressing three times week. Wound #1 Sacrum: Change dressing every day. Skin Barriers/Peri-Wound Care: Wound #1 Sacrum: Skin Prep - to periwound Wound Cleansing: Wound #1 Sacrum: Clean wound with Wound Cleanser Wound #2 Left Calcaneus: Clean wound with Wound Cleanser Primary Wound Dressing: Wound #1 Sacrum: Other: - Vashe moistened gauze packing (use anasepr in clinic) Secondary Dressing: Wound #1 Sacrum: ABD pad - secured with tegaderm to keep dressing from getting soiled Wound #2 Left Calcaneus: Kerlix/Rolled Gauze Dry Gauze Heel Cup Off-Loading: Turn and reposition every 2 hours Other: - float heels with pillows under calves while in bed Home Health: [place] [facility] for wound care. - [facility]. My suggestion at this time is can be that regarding continue with the current wound care measures specifically with regard to the sacrum or will be using Vashe moistened gauze followed by an ABD pad and Tegaderm to secure this so that when he does have a bowel movement this would not have to be changed every single time he  dies. 2. I am also can recommend that we use a Hydrofera Blue dressing along with a border foam to the left heel. 3. I am also can recommend turning and repositioning along with continued use of the air mattress as well as pillows for positioning. We will see patient back for reevaluation in 1 week here in the clinic. If anything worsens or changes patient will contact our office for additional recommendations. Electronic Signature(s) Signed: 10/17/2019 2:13:26 PM By: Worthy Keeler PA-C Entered By: Worthy Keeler on 10/17/2019 14:13:25 -------------------------------------------------------------------------------- HxROS Details Patient Name: Date of Service: [name], [name] 10/17/2019 1:15 PM Medical Record [id number] Patient Account Number: [id number] Date of Birth/Sex: Treating RN: [birth year] (86 y.o. Male) Levan Hurst Primary Care Provider: Hollace Kinnier Other Clinician: Referring Provider: Treating Provider/Extender:Stone III, Stacey Drain, Tiffany Weeks in Treatment: 0 Constitutional Symptoms (General Health) Complaints and Symptoms: Negative for: Fatigue; Fever; Chills; Marked Weight Change Ear/Nose/Mouth/Throat Complaints and Symptoms: Negative for: Chronic sinus problems or rhinitis Respiratory Complaints and Symptoms: Negative for: Chronic or frequent coughs; Shortness of Breath Medical History: Positive for: Chronic Obstructive Pulmonary Disease (COPD) Cardiovascular Complaints and Symptoms: Negative for: Chest pain Medical History: Positive for: Coronary Artery Disease; Hypertension Past Medical History Notes: CVA Nov 2020, Hyperlipidemia Gastrointestinal Complaints and Symptoms: Negative for: Frequent diarrhea; Nausea; Vomiting Genitourinary Complaints  and Symptoms: Negative for: Frequent urination Integumentary (Skin) Complaints and Symptoms: Positive for: Wounds - wound on sacrum and left heel Psychiatric Complaints and Symptoms: Negative for:  Claustrophobia; Suicidal Eyes Medical History: Positive for: Glaucoma Hematologic/Lymphatic Endocrine Medical History: Positive for: Type II Diabetes Time with diabetes: over 30 years Treated with: Insulin Blood sugar tested every day: Yes Tested : 3 times a day Immunological Musculoskeletal Medical History: Positive for: Osteoarthritis Neurologic Medical History: Positive for: Dementia Oncologic HBO Extended History Items Eyes: Glaucoma Immunizations Pneumococcal Vaccine: Received Pneumococcal Vaccination: Yes Implantable Devices None Family and Social History Unknown History: Yes; Former smoker - quit in 1970s; Marital Status - Widowed; Alcohol Use: Never; Drug Use: No History; Caffeine Use: Never; Financial Concerns: No; Food, Clothing or Shelter Needs: No; Support System Lacking: No; Transportation Concerns: No Electronic Signature(s) Signed: 10/17/2019 4:26:16 PM By: Worthy Keeler PA-C Signed: 10/22/2019 5:18:21 PM By: Levan Hurst RN, BSN Entered By: Levan Hurst on 10/17/2019 13:34:21 -------------------------------------------------------------------------------- [place] Details Patient Name: Date of Service: [name], [name] 10/17/2019 Medical Record [id number] Patient Account Number: [id number] Date of Birth/Sex: Treating RN: 09/12/1921 (86 y.o. Male) Baruch Gouty Primary Care Provider: Hollace Kinnier Other Clinician: Referring Provider: Treating Provider/Extender:Stone III, Stacey Drain, Tiffany Weeks in Treatment: 0 Diagnosis Coding ICD-10 Codes Code Description L89.154 Pressure ulcer of sacral region, stage 4 E11.621 Type 2 diabetes mellitus with foot ulcer L97.522 Non-pressure chronic ulcer of other part of left foot with fat layer exposed F01.50 Vascular dementia without behavioral disturbance I10 Essential (primary) hypertension Facility Procedures CPT4 Code Description: [id number] - WOUND CARE VISIT-LEV 3 EST PT Modifier:  25 Quantity: 1 Physician Procedures CPT4 Code Description: [id number] WC PHYS LEVEL 3 NEW PT ICD-10 Diagnosis Description L89.154 Pressure ulcer of sacral region, stage 4 E11.621 Type 2 diabetes mellitus with foot ulcer L97.522 Non-pressure chronic ulcer of other part of left foot wi F01.50  Vascular dementia without behavioral disturbance Modifier: 25 th fat layer ex Quantity: 1 posed CPT4 Code Description: [id number] - WC PHYS SUBQ TISS 20 SQ CM ICD-10 Diagnosis Description L89.154 Pressure ulcer of sacral region, stage 4 L97.522 Non-pressure chronic ulcer of other part of left foot with Modifier: fat layer expos Quantity: 1 ed Electronic Signature(s) Signed: 10/17/2019 2:14:29 PM By: Worthy Keeler PA-C Previous Signature: 10/17/2019 2:13:44 PM Version By: Worthy Keeler PA-C Entered By: Worthy Keeler on 10/17/2019 14:14:28

## 2019-10-22 NOTE — Progress Notes (Signed)
[name], [name] ([id number]) Visit Report for 10/17/2019 Allergy List Details Patient Name: Date of Service: [name], [name] 10/17/2019 1:15 PM Medical Record [id number] Patient Account Number: [id number] Date of Birth/Sex: Treating RN: 02-Feb-1922 (86 y.o. Male) Levan Hurst Primary Care Naama Sappington: Hollace Kinnier Other Clinician: Referring Angelis Gates: Treating Jerry Clyne/Extender:Stone III, Stacey Drain, Tiffany Weeks in Treatment: 0 Allergies Active Allergies lisinopril Reaction: cough Severity: Moderate Allergy Notes Electronic Signature(s) Signed: 10/22/2019 5:18:21 PM By: Levan Hurst RN, BSN Entered By: Levan Hurst on 10/17/2019 13:30:49 -------------------------------------------------------------------------------- Arrival Information Details Patient Name: Date of Service: [name], [name] 10/17/2019 1:15 PM Medical Record [id number] Patient Account Number: [id number] Date of Birth/Sex: Treating RN: Dec 25, 1921 (86 y.o. Male) Levan Hurst Primary Care Tayvon Culley: Hollace Kinnier Other Clinician: Referring Meyli Boice: Treating Ryder Man/Extender:Stone III, Stacey Drain, Tiffany Weeks in Treatment: 0 Visit Information Patient Arrived: Wheel Chair Arrival Time: 13:07 Accompanied By: daughter Transfer Assistance: None Patient Identification Verified: Yes Secondary Verification Process Yes Completed: Patient Requires Transmission-Based No Precautions: Patient Has Alerts: Yes Patient Alerts: Patient on Blood Thinner L ABI non compressible Electronic Signature(s) Signed: 10/17/2019 4:31:34 PM By: Deon Pilling Entered By: Deon Pilling on 10/17/2019 14:35:45 -------------------------------------------------------------------------------- Encounter Discharge Information Details Patient Name: Date of Service: [name], [name] 10/17/2019 1:15 PM Medical Record [id number] Patient Account Number: [id number] Date of Birth/Sex: Treating RN: 1921/11/14 (86 y.o. Male) Deon Pilling Primary Care Katlin Bortner: Hollace Kinnier Other Clinician: Referring Alta Goding: Treating Kacen Mellinger/Extender:Stone III, Stacey Drain, Tiffany Weeks in Treatment: 0 Encounter Discharge Information Items Post Procedure Vitals Discharge Condition: Stable Temperature (F): 97.7 Ambulatory Status: Wheelchair Pulse (bpm): 84 Discharge Destination: Home Respiratory Rate (breaths/min): 16 Transportation: Private Auto Blood Pressure (mmHg): 115/46 Accompanied By: daughter Schedule Follow-up Appointment: Yes Clinical Summary of Care: Electronic Signature(s) Signed: 10/17/2019 4:31:34 PM By: Deon Pilling Entered By: Deon Pilling on 10/17/2019 15:42:54 -------------------------------------------------------------------------------- Lower Extremity Assessment Details Patient Name: Date of Service: [name], [name] 10/17/2019 1:15 PM Medical Record [id number] Patient Account Number: [id number] Date of Birth/Sex: Treating RN: 1922-01-20 (86 y.o. Male) Deon Pilling Primary Care Virgal Warmuth: Hollace Kinnier Other Clinician: Referring Jasson Siegmann: Treating Brantly Kalman/Extender:Stone III, Stacey Drain, Tiffany Weeks in Treatment: 0 Edema Assessment Assessed: [Left: Yes] [Right: No] Edema: [Left: N] [Right: o] E[Left: lectronic Signature(s] [Right: )] S[Left: igned: 10/17/2019 4:31:34 PM By: Rolin Barry, Bobb] [Right: i] [Left: Entered By: Deon Pilling on 10/17/2019 14:35:5] [Right: 6] -------------------------------------------------------------------------------- Multi-Disciplinary Care Plan Details Patient Name: Date of Service: [name], [name] 10/17/2019 1:15 PM Medical Record [id number] Patient Account Number: [id number] Date of Birth/Sex: Treating RN: Jul 05, 1922 (86 y.o. Male) Baruch Gouty Primary Care Violette Morneault: Hollace Kinnier Other Clinician: Referring Fynlee Rowlands: Treating Jayne Peckenpaugh/Extender:Stone III, Stacey Drain, Tiffany Weeks in Treatment: 0 Active Inactive Abuse / Safety / Falls / Self Care  Management Nursing Diagnoses: Potential for falls Goals: Patient/caregiver will verbalize/demonstrate measures taken to prevent injury and/or falls Date Initiated: 10/17/2019 Target Resolution Date: 11/14/2019 Goal Status: Active Interventions: Assess fall risk on admission and as needed Assess: immobility, friction, shearing, incontinence upon admission and as needed Assess impairment of mobility on admission and as needed per policy Notes: Nutrition Nursing Diagnoses: Imbalanced nutrition Potential for alteratiion in Nutrition/Potential for imbalanced nutrition Goals: Patient/caregiver agrees to and verbalizes understanding of need to use nutritional supplements and/or vitamins as prescribed Date Initiated: 10/17/2019 Target Resolution Date: 11/14/2019 Goal Status: Active Interventions: Assess patient nutrition upon admission and as needed per policy Treatment Activities: Patient referred to Primary Care Physician for further nutritional evaluation : 10/17/2019 Notes: Pressure Nursing Diagnoses: Knowledge deficit related to causes and risk factors for pressure ulcer development Knowledge  deficit related to management of pressures ulcers Goals: Patient/caregiver will verbalize understanding of pressure ulcer management Date Initiated: 10/17/2019 Target Resolution Date: 11/14/2019 Goal Status: Active Interventions: Assess: immobility, friction, shearing, incontinence upon admission and as needed Assess offloading mechanisms upon admission and as needed Assess potential for pressure ulcer upon admission and as needed Provide education on pressure ulcers Notes: Wound/Skin Impairment Nursing Diagnoses: Impaired tissue integrity Knowledge deficit related to ulceration/compromised skin integrity Goals: Patient/caregiver will verbalize understanding of skin care regimen Date Initiated: 10/17/2019 Target Resolution Date: 11/14/2019 Goal Status: Active Ulcer/skin breakdown will  have a volume reduction of 30% by week 4 Date Initiated: 10/17/2019 Target Resolution Date: 11/14/2019 Goal Status: Active Interventions: Assess patient/caregiver ability to obtain necessary supplies Assess patient/caregiver ability to perform ulcer/skin care regimen upon admission and as needed Assess ulceration(s) every visit Provide education on ulcer and skin care Treatment Activities: Skin care regimen initiated : 10/17/2019 Topical wound management initiated : 10/17/2019 Notes: Electronic Signature(s) Signed: 10/17/2019 4:42:28 PM By: Baruch Gouty RN, BSN Entered By: Baruch Gouty on 10/17/2019 13:39:03 -------------------------------------------------------------------------------- Pain Assessment Details Patient Name: Date of Service: [name], [name] 10/17/2019 1:15 PM Medical Record [id number] Patient Account Number: [id number] Date of Birth/Sex: Treating RN: 1921-10-04 (86 y.o. Male) Levan Hurst Primary Care Lanelle Lindo: Hollace Kinnier Other Clinician: Referring Raizy Auzenne: Treating Sayward Horvath/Extender:Stone III, Stacey Drain, Tiffany Weeks in Treatment: 0 Active Problems Location of Pain Severity and Description of Pain Patient Has Paino No Site Locations Pain Management and Medication Current Pain Management: Electronic Signature(s) Signed: 10/22/2019 5:18:21 PM By: Levan Hurst RN, BSN Entered By: Levan Hurst on 10/17/2019 13:39:49 -------------------------------------------------------------------------------- Patient/Caregiver Education Details Patient Name: Domenick Gong 3/17/2021andnbsp1:15 Date of Service: PM Medical Record [id number] Number: Patient Account Number: [id number] Treating RN: [birth year] (86 y.o. Baruch Gouty Date of Birth/Gender: Male) Other Clinician: Primary Care Physician: Hollace Kinnier Treating Worthy Keeler Referring Physician: Physician/Extender: Bevely Palmer in Treatment: 0 Education Assessment Education Provided  To: Patient Education Topics Provided Pressure: Handouts: Pressure Ulcers: Care and Offloading, Pressure Ulcers: Care and Offloading 2 Methods: Explain/Verbal, Printed Responses: Reinforcements needed, State content correctly Wound/Skin Impairment: Handouts: Caring for Your Ulcer, Skin Care Do's and Dont's Methods: Explain/Verbal, Printed Responses: Reinforcements needed, State content correctly Electronic Signature(s) Signed: 10/17/2019 4:42:28 PM By: Baruch Gouty RN, BSN Entered By: Baruch Gouty on 10/17/2019 13:56:20 -------------------------------------------------------------------------------- Wound Assessment Details Patient Name: Date of Service: [name], [name] 10/17/2019 1:15 PM Medical Record [id number] Patient Account Number: [id number] Date of Birth/Sex: Treating RN: Dec 04, 1921 (86 y.o. Male) Levan Hurst Primary Care Gwenith Tschida: Hollace Kinnier Other Clinician: Referring Ly Wass: Treating Ying [place]ocks/Extender:Stone III, Stacey Drain, Tiffany Weeks in Treatment: 0 Wound Status Wound Number: 1 Primary Pressure Ulcer Etiology: Wound Location: Sacrum Wound Open Wounding Event: Pressure Injury Status: Date Acquired: 09/03/2019 Comorbid Glaucoma, Chronic Obstructive Pulmonary Weeks Of Treatment: 0 History: Disease (COPD), Coronary Artery Disease, Clustered Wound: No Hypertension, Type II Diabetes, Osteoarthritis, Dementia Photos Photo Uploaded By: Mikeal Hawthorne on 10/18/2019 14:46:30 Wound Measurements Length: (cm) 3.7 Width: (cm) 3 Depth: (cm) 1.7 Area: (cm) 8.718 Volume: (cm) 14.82 Wound Description Classification: Category/Stage IV Wound Margin: Well defined, not attached Exudate Amount: Medium Exudate Type: Serosanguineous Exudate Color: red, brown Wound Bed Granulation Amount: Medium (34-66%) Granulation Quality: Red, Pink Necrotic Amount: Medium (34-66%) Necrotic Quality: Eschar, Adherent Slough Foul Odor After Cleansing: No Slough/Fibrino  Ye Exposed Structure Fascia Exposed: N Fat Layer (Subcutaneous Tissue) Exposed: Y Tendon Exposed: N Muscle Exposed: Y Necrosis of Muscle: N Joint Exposed: N Bone Exposed: Y % Reduction in Area: %  Reduction in Volume: Epithelialization: None Tunneling: No Undermining: No s o es o es o o es Treatment Notes Wound #1 (Sacrum) 1. Cleanse With Wound Cleanser 2. Periwound Care Skin Prep 3. Primary Dressing Applied Other primary dressing (specifiy in notes) 4. Secondary Dressing ABD Pad Other secondary dressing (specify in notes) 5. Secured With Other (specify in notes) Notes anacept gauze used in clinic as primary dressing. secured abd pad with tegaderm. Electronic Signature(s) Signed: 10/22/2019 5:18:21 PM By: Levan Hurst RN, BSN Entered By: Levan Hurst on 10/17/2019 13:38:04 -------------------------------------------------------------------------------- Wound Assessment Details Patient Name: Date of Service: [name], [name] 10/17/2019 1:15 PM Medical Record [id number] Patient Account Number: [id number] Date of Birth/Sex: Treating RN: [birth year] (86 y.o. Male) Levan Hurst Primary Care Sonita Michiels: Hollace Kinnier Other Clinician: Referring Tahjae Clausing: Treating Kendle Erker/Extender:Stone III, Stacey Drain, Tiffany Weeks in Treatment: 0 Wound Status Wound Number: 2 Primary Diabetic Wound/Ulcer of the Lower Etiology: Extremity Wound Location: Left Calcaneus Secondary Pressure Ulcer Wounding Event: Pressure Injury Etiology: Etiology: Date Acquired: 10/01/2019 Wound Open Weeks Of Treatment: 0 Status: Clustered Wound: No Comorbid Glaucoma, Chronic Obstructive Pulmonary History: Disease (COPD), Coronary Artery Disease, Hypertension, Type II Diabetes, Osteoarthritis, Dementia Photos Photo Uploaded By: Mikeal Hawthorne on 10/18/2019 14:46:31 Wound Measurements Length: (cm) 3 Width: (cm) 3.5 Depth: (cm) 0.1 Area: (cm) 8.247 Volume: (cm) 0.825 Wound  Description Classification: Grade 2 Wound Margin: Flat and Intact Exudate Amount: Medium Exudate Type: Serosanguineous Exudate Color: red, brown Wound Bed Granulation Amount: Large (67-100%) Granulation Quality: Pink Necrotic Amount: Small (1-33%) Necrotic Quality: Eschar, Adherent Slough fter Cleansing: No ino Yes Exposed Structure ed: No ubcutaneous Tissue) Exposed: Yes ed: No ed: No d: No : No % Reduction in Area: % Reduction in Volume: Epithelialization: None Tunneling: No Undermining: No Foul Odor A Slough/Fibr Fascia Expos Fat Layer (S Tendon Expos Muscle Expos Joint Expose Bone Exposed Treatment Notes Wound #2 (Left Calcaneus) 1. Cleanse With Wound Cleanser 3. Primary Dressing Applied Hydrofera Blue 4. Secondary Dressing Dry Gauze Roll Gauze Heel Cup 5. Secured With [employer]) Signed: 10/22/2019 5:18:21 PM By: Levan Hurst RN, BSN Entered By: Levan Hurst on 10/17/2019 13:39:42 -------------------------------------------------------------------------------- Vitals Details Patient Name: Date of Service: [name], [name] 10/17/2019 1:15 PM Medical Record [id number] Patient Account Number: [id number] Date of Birth/Sex: Treating RN: 1922-04-05 (86 y.o. Male) Levan Hurst Primary Care Kalonji Zurawski: Hollace Kinnier Other Clinician: Referring Berdena Cisek: Treating Jamarrion Budai/Extender:Stone III, Stacey Drain, Tiffany Weeks in Treatment: 0 Vital Signs Time Taken: 13:09 Temperature (F): 97.7 Height (in): 71 Pulse (bpm): 84 Source: Stated Respiratory Rate (breaths/min): 16 Weight (lbs): 175 Blood Pressure (mmHg): 115/46 Source: Stated Capillary Blood Glucose (mg/dl): 112 Body Mass Index (BMI): 24.4 Reference Range: 80 - 120 mg / dl Notes glucose per pt report Electronic Signature(s) Signed: 10/22/2019 5:18:21 PM By: Levan Hurst RN, BSN Entered By: Levan Hurst on 10/17/2019 13:10:03

## 2019-10-25 DIAGNOSIS — M6281 Muscle weakness (generalized): Secondary | ICD-10-CM | POA: Diagnosis not present

## 2019-10-25 DIAGNOSIS — I5032 Chronic diastolic (congestive) heart failure: Secondary | ICD-10-CM | POA: Diagnosis not present

## 2019-10-25 DIAGNOSIS — Z7901 Long term (current) use of anticoagulants: Secondary | ICD-10-CM | POA: Diagnosis not present

## 2019-10-25 DIAGNOSIS — L89302 Pressure ulcer of unspecified buttock, stage 2: Secondary | ICD-10-CM | POA: Diagnosis not present

## 2019-10-25 DIAGNOSIS — I2699 Other pulmonary embolism without acute cor pulmonale: Secondary | ICD-10-CM | POA: Diagnosis not present

## 2019-10-25 DIAGNOSIS — F0391 Unspecified dementia with behavioral disturbance: Secondary | ICD-10-CM | POA: Diagnosis not present

## 2019-10-25 DIAGNOSIS — E119 Type 2 diabetes mellitus without complications: Secondary | ICD-10-CM | POA: Diagnosis not present

## 2019-10-26 DIAGNOSIS — E119 Type 2 diabetes mellitus without complications: Secondary | ICD-10-CM | POA: Diagnosis not present

## 2019-10-26 DIAGNOSIS — F0391 Unspecified dementia with behavioral disturbance: Secondary | ICD-10-CM | POA: Diagnosis not present

## 2019-10-26 DIAGNOSIS — I5032 Chronic diastolic (congestive) heart failure: Secondary | ICD-10-CM | POA: Diagnosis not present

## 2019-10-26 DIAGNOSIS — I2699 Other pulmonary embolism without acute cor pulmonale: Secondary | ICD-10-CM | POA: Diagnosis not present

## 2019-10-26 DIAGNOSIS — L89302 Pressure ulcer of unspecified buttock, stage 2: Secondary | ICD-10-CM | POA: Diagnosis not present

## 2019-10-26 DIAGNOSIS — M6281 Muscle weakness (generalized): Secondary | ICD-10-CM | POA: Diagnosis not present

## 2019-10-26 DIAGNOSIS — Z7901 Long term (current) use of anticoagulants: Secondary | ICD-10-CM | POA: Diagnosis not present

## 2019-10-29 DIAGNOSIS — E119 Type 2 diabetes mellitus without complications: Secondary | ICD-10-CM | POA: Diagnosis not present

## 2019-10-29 DIAGNOSIS — F0391 Unspecified dementia with behavioral disturbance: Secondary | ICD-10-CM | POA: Diagnosis not present

## 2019-10-29 DIAGNOSIS — L89302 Pressure ulcer of unspecified buttock, stage 2: Secondary | ICD-10-CM | POA: Diagnosis not present

## 2019-10-29 DIAGNOSIS — M6281 Muscle weakness (generalized): Secondary | ICD-10-CM | POA: Diagnosis not present

## 2019-10-29 DIAGNOSIS — Z7901 Long term (current) use of anticoagulants: Secondary | ICD-10-CM | POA: Diagnosis not present

## 2019-10-29 DIAGNOSIS — I5032 Chronic diastolic (congestive) heart failure: Secondary | ICD-10-CM | POA: Diagnosis not present

## 2019-10-29 DIAGNOSIS — I2699 Other pulmonary embolism without acute cor pulmonale: Secondary | ICD-10-CM | POA: Diagnosis not present

## 2019-11-01 ENCOUNTER — Encounter (HOSPITAL_BASED_OUTPATIENT_CLINIC_OR_DEPARTMENT_OTHER): Payer: Medicare HMO | Admitting: Internal Medicine

## 2019-11-02 ENCOUNTER — Other Ambulatory Visit: Payer: Self-pay | Admitting: Family

## 2019-11-02 DIAGNOSIS — I2699 Other pulmonary embolism without acute cor pulmonale: Secondary | ICD-10-CM | POA: Diagnosis not present

## 2019-11-02 DIAGNOSIS — E1122 Type 2 diabetes mellitus with diabetic chronic kidney disease: Secondary | ICD-10-CM | POA: Diagnosis not present

## 2019-11-02 DIAGNOSIS — L97422 Non-pressure chronic ulcer of left heel and midfoot with fat layer exposed: Secondary | ICD-10-CM | POA: Diagnosis not present

## 2019-11-02 DIAGNOSIS — F32A Depression, unspecified: Secondary | ICD-10-CM

## 2019-11-02 DIAGNOSIS — F0391 Unspecified dementia with behavioral disturbance: Secondary | ICD-10-CM

## 2019-11-02 DIAGNOSIS — I130 Hypertensive heart and chronic kidney disease with heart failure and stage 1 through stage 4 chronic kidney disease, or unspecified chronic kidney disease: Secondary | ICD-10-CM | POA: Diagnosis not present

## 2019-11-02 DIAGNOSIS — F03B18 Unspecified dementia, moderate, with other behavioral disturbance: Secondary | ICD-10-CM

## 2019-11-02 DIAGNOSIS — E11621 Type 2 diabetes mellitus with foot ulcer: Secondary | ICD-10-CM | POA: Diagnosis not present

## 2019-11-02 DIAGNOSIS — N183 Chronic kidney disease, stage 3 unspecified: Secondary | ICD-10-CM | POA: Diagnosis not present

## 2019-11-02 DIAGNOSIS — F329 Major depressive disorder, single episode, unspecified: Secondary | ICD-10-CM

## 2019-11-02 DIAGNOSIS — L89154 Pressure ulcer of sacral region, stage 4: Secondary | ICD-10-CM | POA: Diagnosis not present

## 2019-11-02 DIAGNOSIS — I5032 Chronic diastolic (congestive) heart failure: Secondary | ICD-10-CM | POA: Diagnosis not present

## 2019-11-03 DIAGNOSIS — R2689 Other abnormalities of gait and mobility: Secondary | ICD-10-CM | POA: Diagnosis not present

## 2019-11-03 DIAGNOSIS — L89153 Pressure ulcer of sacral region, stage 3: Secondary | ICD-10-CM | POA: Diagnosis not present

## 2019-11-05 DIAGNOSIS — N183 Chronic kidney disease, stage 3 unspecified: Secondary | ICD-10-CM | POA: Diagnosis not present

## 2019-11-05 DIAGNOSIS — I5032 Chronic diastolic (congestive) heart failure: Secondary | ICD-10-CM | POA: Diagnosis not present

## 2019-11-05 DIAGNOSIS — L89154 Pressure ulcer of sacral region, stage 4: Secondary | ICD-10-CM | POA: Diagnosis not present

## 2019-11-05 DIAGNOSIS — F0391 Unspecified dementia with behavioral disturbance: Secondary | ICD-10-CM | POA: Diagnosis not present

## 2019-11-05 DIAGNOSIS — I2699 Other pulmonary embolism without acute cor pulmonale: Secondary | ICD-10-CM | POA: Diagnosis not present

## 2019-11-05 DIAGNOSIS — E1122 Type 2 diabetes mellitus with diabetic chronic kidney disease: Secondary | ICD-10-CM | POA: Diagnosis not present

## 2019-11-05 DIAGNOSIS — I130 Hypertensive heart and chronic kidney disease with heart failure and stage 1 through stage 4 chronic kidney disease, or unspecified chronic kidney disease: Secondary | ICD-10-CM | POA: Diagnosis not present

## 2019-11-05 DIAGNOSIS — L97422 Non-pressure chronic ulcer of left heel and midfoot with fat layer exposed: Secondary | ICD-10-CM | POA: Diagnosis not present

## 2019-11-05 DIAGNOSIS — E11621 Type 2 diabetes mellitus with foot ulcer: Secondary | ICD-10-CM | POA: Diagnosis not present

## 2019-11-05 NOTE — Telephone Encounter (Signed)
rx sent to pharmacy by e-script Medication is not on his list

## 2019-11-08 DIAGNOSIS — E1122 Type 2 diabetes mellitus with diabetic chronic kidney disease: Secondary | ICD-10-CM | POA: Diagnosis not present

## 2019-11-08 DIAGNOSIS — F0391 Unspecified dementia with behavioral disturbance: Secondary | ICD-10-CM | POA: Diagnosis not present

## 2019-11-08 DIAGNOSIS — I130 Hypertensive heart and chronic kidney disease with heart failure and stage 1 through stage 4 chronic kidney disease, or unspecified chronic kidney disease: Secondary | ICD-10-CM | POA: Diagnosis not present

## 2019-11-08 DIAGNOSIS — I5032 Chronic diastolic (congestive) heart failure: Secondary | ICD-10-CM | POA: Diagnosis not present

## 2019-11-08 DIAGNOSIS — I2699 Other pulmonary embolism without acute cor pulmonale: Secondary | ICD-10-CM | POA: Diagnosis not present

## 2019-11-08 DIAGNOSIS — E11621 Type 2 diabetes mellitus with foot ulcer: Secondary | ICD-10-CM | POA: Diagnosis not present

## 2019-11-08 DIAGNOSIS — U071 COVID-19: Secondary | ICD-10-CM | POA: Diagnosis not present

## 2019-11-08 DIAGNOSIS — N183 Chronic kidney disease, stage 3 unspecified: Secondary | ICD-10-CM | POA: Diagnosis not present

## 2019-11-08 DIAGNOSIS — L97422 Non-pressure chronic ulcer of left heel and midfoot with fat layer exposed: Secondary | ICD-10-CM | POA: Diagnosis not present

## 2019-11-08 DIAGNOSIS — L89154 Pressure ulcer of sacral region, stage 4: Secondary | ICD-10-CM | POA: Diagnosis not present

## 2019-11-09 ENCOUNTER — Other Ambulatory Visit: Payer: Self-pay

## 2019-11-09 ENCOUNTER — Encounter: Payer: Self-pay | Admitting: Internal Medicine

## 2019-11-09 ENCOUNTER — Ambulatory Visit (INDEPENDENT_AMBULATORY_CARE_PROVIDER_SITE_OTHER): Payer: Medicare HMO | Admitting: Internal Medicine

## 2019-11-09 ENCOUNTER — Encounter (HOSPITAL_BASED_OUTPATIENT_CLINIC_OR_DEPARTMENT_OTHER): Payer: Medicare HMO | Attending: Internal Medicine | Admitting: Internal Medicine

## 2019-11-09 DIAGNOSIS — L89529 Pressure ulcer of left ankle, unspecified stage: Secondary | ICD-10-CM | POA: Diagnosis not present

## 2019-11-09 DIAGNOSIS — F0151 Vascular dementia with behavioral disturbance: Secondary | ICD-10-CM

## 2019-11-09 DIAGNOSIS — L89153 Pressure ulcer of sacral region, stage 3: Secondary | ICD-10-CM | POA: Diagnosis not present

## 2019-11-09 DIAGNOSIS — R5381 Other malaise: Secondary | ICD-10-CM | POA: Diagnosis not present

## 2019-11-09 DIAGNOSIS — R6889 Other general symptoms and signs: Secondary | ICD-10-CM | POA: Diagnosis not present

## 2019-11-09 DIAGNOSIS — E11621 Type 2 diabetes mellitus with foot ulcer: Secondary | ICD-10-CM | POA: Insufficient documentation

## 2019-11-09 DIAGNOSIS — L97422 Non-pressure chronic ulcer of left heel and midfoot with fat layer exposed: Secondary | ICD-10-CM | POA: Diagnosis not present

## 2019-11-09 DIAGNOSIS — F0150 Vascular dementia without behavioral disturbance: Secondary | ICD-10-CM | POA: Diagnosis not present

## 2019-11-09 DIAGNOSIS — F01518 Vascular dementia, unspecified severity, with other behavioral disturbance: Secondary | ICD-10-CM

## 2019-11-09 DIAGNOSIS — I10 Essential (primary) hypertension: Secondary | ICD-10-CM | POA: Diagnosis not present

## 2019-11-09 DIAGNOSIS — Z8673 Personal history of transient ischemic attack (TIA), and cerebral infarction without residual deficits: Secondary | ICD-10-CM | POA: Insufficient documentation

## 2019-11-09 DIAGNOSIS — L97522 Non-pressure chronic ulcer of other part of left foot with fat layer exposed: Secondary | ICD-10-CM | POA: Diagnosis not present

## 2019-11-09 DIAGNOSIS — L89154 Pressure ulcer of sacral region, stage 4: Secondary | ICD-10-CM | POA: Insufficient documentation

## 2019-11-09 DIAGNOSIS — J9601 Acute respiratory failure with hypoxia: Secondary | ICD-10-CM | POA: Insufficient documentation

## 2019-11-09 NOTE — Progress Notes (Signed)
This service is provided via telemedicine  No vital signs collected/recorded due to the encounter was a telemedicine visit.   Location of patient (ex: home, work):Home  Patient consents to a telephone visit: yes  Location of the provider (ex: office, home): [facility] Name of any referring provider:  Dr. Hollace Kinnier DO Names of all persons participating in the telemedicine service and their role in the encounter:  Dr. Hollace Kinnier DO, Charlene CMA, [name] and Patient Time spent on call:  8 min with Medical assistant    Provider:  Jonelle Sidle L. Mariea Clonts, D.O., C.M.D.  Goals of Care:  Advanced Directives 11/09/2019  Does Patient Have a Medical Advance Directive? Yes  Type of Advance Directive [place]  Does patient want to make changes to medical advance directive? No - Patient declined  Copy of [place] in Chart? Yes - validated most recent copy scanned in chart (See row information)  Would patient like information on creating a medical advance directive? -     Chief Complaint  Patient presents with  . Medical Management of Chronic Issues    4 week follow up     HPI: Patient is a 86 y.o. male seen today for medical management of chronic diseases--4 week f/u.  This is a phone visit per daughter's request so I could not visualize wounds.  He is doing ok.  He is sitting out on the front porch.   He went to the wound care center 3/17.  Wound is shrinking in size.  Foot (heal) wound is pretty much healed.    She reports he's stuffing a whole banana in his mouth this morning.  He loves those.   He is sleeping ok at night.   Sometimes he wants to talk and sometimes quiet.  No agitation during care.   They got the air mattress, the hospital bed, all things needed.    She's waiting to hear from the [place] office about aide and attendance.  She is going to reach out to them again Monday.  She has a contact there.    Past Medical History:  Diagnosis Date  .  Arthritis   . CAD (coronary artery disease)    s/p DES to LAD 06/2013  . Dementia with behavioral disturbance ([place])   . Depression   . Dyslipidemia   . Glaucoma   . Hyperglycemia 04/17/2014  . Hyperlipidemia   . Hypertension   . Stroke ([place])   . Type 2 diabetes mellitus ([place])     Past Surgical History:  Procedure Laterality Date  . CARDIAC CATHETERIZATION  06/2013   DES to LAD by Dr Tamala Julian  . Right Cataract      Allergies  Allergen Reactions  . Lisinopril Cough    Outpatient Encounter Medications as of 11/09/2019  Medication Sig  . albuterol (VENTOLIN HFA) 108 (90 Base) MCG/ACT inhaler Inhale 2 puffs into the lungs every 4 (four) hours as needed for wheezing or shortness of breath. INHALE TWO PUFFS EVERY 4-6 HOURS AS NEEDED FOR DIFFICULTY BREATHING,WHEEZING OR COUGH  . Alcohol Swabs (SURE-PREP ALCOHOL PREP) 70 % PADS 1 each by Does not apply route as needed.  [name] amLODipine (NORVASC) 5 MG tablet Take 1 tablet (5 mg total) by mouth daily.  [name] aspirin EC 81 MG tablet Take 1 tablet (81 mg total) by mouth daily.  . Blood Glucose Monitoring Suppl (ACCU-CHEK AVIVA) device Use to test blood sugar three times daily. Dx E11.51  . ELIQUIS 5 MG  TABS tablet Take 1 tablet by mouth twice daily  . escitalopram (LEXAPRO) 5 MG tablet TAKE 1 TABLET EVERY DAY  . glucose blood (ACCU-CHEK AVIVA) test strip Use to test blood sugar three times daily. Dx: E11.51  . hydrochlorothiazide (HYDRODIURIL) 25 MG tablet TAKE 1 TABLET EVERY DAY  . insulin glargine (LANTUS) 100 UNIT/ML injection Inject 7 Units into the skin daily.  . Insulin Pen Needle (B-D UF III MINI PEN NEEDLES) 31G X 5 MM MISC USE AS DIRECTED.  Elmore Guise Devices (ACCU-CHEK SOFTCLIX) lancets Use as instructed with checking blood sugar three times daily. Dx: E11.51  . Loratadine 10 MG CAPS Take 1 capsule (10 mg total) by mouth daily.  [name] losartan (COZAAR) 50 MG tablet TAKE 1 TABLET EVERY DAY  . Multiple Vitamins-Minerals (SENTRY SENIOR) TABS Take 1  tablet by mouth daily.  [name] NAMZARIC 28-10 MG CP24 TAKE 1 CAPSULE EVERY DAY  . simvastatin (ZOCOR) 20 MG tablet Take 1 tablet (20 mg total) by mouth daily.  . tamsulosin (FLOMAX) 0.4 MG CAPS capsule Take 1 capsule (0.4 mg total) by mouth daily after supper.   No facility-administered encounter medications on file as of 11/09/2019.    Review of Systems:  Review of Systems  Reason unable to perform ROS: obtained from daughter, [name].  Constitutional: Negative for chills and fever.  Respiratory: Negative for shortness of breath.   Cardiovascular: Negative for chest pain and leg swelling.  Gastrointestinal: Negative for constipation, diarrhea, nausea and vomiting.  Genitourinary:       Incontinence of urine and stool  Musculoskeletal: Negative for falls.  Skin:       Wounds reportedly improving--daughter wanted phone visit due to transportation issues  Neurological: Negative for loss of consciousness.  Psychiatric/Behavioral: Positive for memory loss. Negative for depression. The patient does not have insomnia.     Health Maintenance  Topic Date Due  . OPHTHALMOLOGY EXAM  06/14/2018  . FOOT EXAM  10/05/2018  . HEMOGLOBIN A1C  11/30/2019  . INFLUENZA VACCINE  03/02/2020  . TETANUS/TDAP  07/29/2025  . PNA vac Low Risk Adult  Completed    Physical Exam: Could not be performed as visit non face-to-face via phone  Labs reviewed: Basic Metabolic Panel: Recent Labs    06/01/19 2202 06/02/19 0250 07/06/19 0204 07/07/19 0033 07/09/19 0335  NA  --    < > 143 144 143  K  --    < > 4.9 4.6 4.7  CL  --    < > 105 105 103  CO2  --    < > 30 29 30   GLUCOSE  --    < > 193* 226* 91  BUN  --    < > 29* 36* 35*  CREATININE  --    < > 1.29* 1.12 0.98  CALCIUM  --    < > 8.9 8.9 9.3  MG  --   --   --  2.1  --   TSH 1.527  --   --   --   --    < > = values in this interval not displayed.   Liver Function Tests: Recent Labs    07/06/19 0204 07/07/19 0033 07/09/19 0335  AST 35 47* 94*   ALT 30 38 107*  ALKPHOS 69 59 62  BILITOT 0.7 0.2* 0.3  PROT 6.5 6.2* 6.5  ALBUMIN 3.2* 2.9* 3.3*   No results for input(s): LIPASE, AMYLASE in the last 8760 hours. Recent Labs    06/01/19  2202  AMMONIA 29   CBC: Recent Labs    07/05/19 1536 07/06/19 0204 07/07/19 0033  WBC 3.8* 4.0 2.6*  NEUTROABS 2.2 2.9 1.9  HGB 9.7* 10.5* 9.0*  HCT 31.4* 34.1* 29.1*  MCV 96.3 96.6 95.4  PLT 170 162 179   Lipid Panel: Recent Labs    12/01/18 1032 06/02/19 0250 07/05/19 1536  CHOL 147 134  --   HDL 37* 48  --   LDLCALC 89 74  --   TRIG 117 61 126  CHOLHDL 4.0 2.8  --    Lab Results  Component Value Date   HGBA1C 6.3 (H) 06/02/2019    Assessment/Plan 1. Vascular dementia with behavior disturbance ([place]) -he recently has not had any behavioral issues during care per his daughter and he tolerated debridement of wounds well at wound care center -remains on namzaric  2. Pressure injury of sacral region, stage 3 ([place]) -reportedly shrinking and improved per [facility] and home health notes -for daily dressing changes  3. Pressure injury of skin of left ankle, unspecified injury stage -cont dressings per wound care recommendations--nearly healed per Mary  4. Debility Remains--he's frail and fully dependent in adls at this time  Labs/tests ordered:   Lab Orders  No laboratory test(s) ordered today   Next appt:  01/17/2020  Non face-to-face time spent on televisit:  27 mins total  Kamaiyah Uselton L. Raelynn Corron, D.O. [facility] [address], [place] [address] Cell Phone (Mon-Fri 8am-5pm):  [phone] On Call:  [phone] & follow prompts after 5pm & weekends Office Phone:  [phone] Office Fax:  [phone]

## 2019-11-09 NOTE — Patient Instructions (Signed)
I'm so glad [name] is doing better after his wound debridement and since he now has his hospital bed, pressure mattress.  I hope you hear from the [place] soon. Continue wound care with home health.   Monitor for any fevers, chills, foul odor from wounds.

## 2019-11-11 ENCOUNTER — Other Ambulatory Visit: Payer: Self-pay | Admitting: Internal Medicine

## 2019-11-11 DIAGNOSIS — I10 Essential (primary) hypertension: Secondary | ICD-10-CM

## 2019-11-11 DIAGNOSIS — I2699 Other pulmonary embolism without acute cor pulmonale: Secondary | ICD-10-CM

## 2019-11-12 ENCOUNTER — Other Ambulatory Visit: Payer: Self-pay | Admitting: *Deleted

## 2019-11-12 ENCOUNTER — Telehealth: Payer: Self-pay | Admitting: *Deleted

## 2019-11-12 DIAGNOSIS — I10 Essential (primary) hypertension: Secondary | ICD-10-CM

## 2019-11-12 DIAGNOSIS — I2699 Other pulmonary embolism without acute cor pulmonale: Secondary | ICD-10-CM

## 2019-11-12 MED ORDER — APIXABAN 5 MG PO TABS: 5.0000 mg | ORAL_TABLET | Freq: Two times a day (BID) | ORAL | 1 refills | Status: DC

## 2019-11-12 MED ORDER — AMLODIPINE BESYLATE 5 MG PO TABS: 5.0000 mg | ORAL_TABLET | Freq: Every day | ORAL | 1 refills | Status: DC

## 2019-11-12 NOTE — Progress Notes (Signed)
MIHAJLO, HUDELSON ([id number]) Visit Report for 11/09/2019 HPI Details Patient Name: Date of Service: [name], [name] 11/09/2019 1:15 PM Medical Record [id number] Patient Account Number: [id number] Date of Birth/Sex: Treating RN: Jun 05, 1922 (86 y.o. [name] Primary Care Provider: Hollace Kinnier Other Clinician: Referring Provider: Treating Provider/Extender:Taina Landry, Arlyss Queen, Lindell Spar in Treatment: 3 History of Present Illness HPI Description: 10/17/2019 upon evaluation today patient presents for initial inspection here in our clinic concerning issues that has been having with a wound in the sacral region as well as the left heel. Fortunately neither appears to be overtly and significantly infected there is some necrotic tissue noted at both locations however that is going to be required to be addressed. He does have a history of diabetes mellitus type 2, vascular dementia, and hypertension. The patient did have a stroke in November 2020 unfortunately he has not been the same since that time according to his daughter. Prior to that he was taking care of himself now he is 100% dependent. He is at home with his daughter who is present during the office visit today as well that is where I get the majority of the history from at this point. Patient does have a history of hypertension as well which does not appear to be too out of control today which is good news. He does have a hemoglobin A1c of 6.05 May 2019. They currently have been using a calcium alginate dressing to the sacrum and it sounds like Skin-Prep and a foam on the heel. 4/9; pressure ulcers on the sacral area and left heel. Been using Anasept wet-to-dry on the sacrum and Hydrofera Blue in the heel. He has home health family is helping with the dressing. It sounds as though they are fairly religious about offloading these areas that have pressure relief surfaces for the bed. Wounds look better Electronic  Signature(s) Signed: 11/12/2019 9:07:11 AM By: Linton Ham MD Entered By: Linton Ham on 11/09/2019 14:27:15 -------------------------------------------------------------------------------- Physical Exam Details Patient Name: Date of Service: [name], [name] 11/09/2019 1:15 PM Medical Record [id number] Patient Account Number: [id number] Date of Birth/Sex: Treating RN: 1922-03-02 (86 y.o. [name] Primary Care Provider: Hollace Kinnier Other Clinician: Referring Provider: Treating Provider/Extender:Mikita Lesmeister, Arlyss Queen, Tiffany Weeks in Treatment: 3 Constitutional Sitting or standing Blood Pressure is within target range for patient.. Pulse regular and within target range for patient.[name] Respirations regular, non-labored and within target range.. Temperature is normal and within the target range for the patient.[name] Appears in no distress. Respiratory work of breathing is normal. Integumentary (Hair, Skin) No erythema or tenderness around any of the wounds. Psychiatric Severe dementia. Notes Wound exam; he has healthy granulation on the wound on the sacrum. There is undermining at about 12-4 at about 2.3 cm. No exposed bone no surrounding erythema. The areas on his heel are superficial and looks as though they are on the way to closing. Electronic Signature(s) Signed: 11/12/2019 9:07:11 AM By: Linton Ham MD Entered By: Linton Ham on 11/09/2019 14:28:25 -------------------------------------------------------------------------------- Physician Orders Details Patient Name: Date of Service: [name], [name] 11/09/2019 1:15 PM Medical Record [id number] Patient Account Number: [id number] Date of Birth/Sex: Treating RN: 1921/08/25 (86 y.o. [name] Primary Care Provider: Hollace Kinnier Other Clinician: Referring Provider: Treating Provider/Extender:Tamre Cass, Arlyss Queen, Lindell Spar in Treatment: 3 Verbal / Phone Orders: No Diagnosis  Coding ICD-10 Coding Code Description L89.154 Pressure ulcer of sacral region, stage 4 E11.621 Type 2 diabetes mellitus with foot ulcer L97.522 Non-pressure chronic ulcer of other part of left foot with  fat layer exposed F01.50 Vascular dementia without behavioral disturbance I10 Essential (primary) hypertension Follow-up Appointments Return appointment in 3 weeks. Dressing Change Frequency Wound #1 Sacrum Change dressing every day. Wound #2 Left Calcaneus Change dressing three times week. Skin Barriers/Peri-Wound Care Wound #1 Sacrum Skin Prep - to periwound Wound Cleansing Wound #1 Sacrum Clean wound with Wound Cleanser Wound #2 Left Calcaneus Clean wound with Wound Cleanser Primary Wound Dressing Wound #1 Sacrum Other: - Vashe moistened gauze packing (use anasepr in clinic) Wound #2 Left Calcaneus Hydrofera Blue - ready Secondary Dressing Wound #1 Sacrum ABD pad - secured with tegaderm to keep dressing from getting soiled Wound #2 Left Calcaneus Kerlix/Rolled Gauze Dry Gauze Heel Cup Off-Loading Turn and reposition every 2 hours Other: - float heels with pillows under calves while in bed [facility] for wound care. - [facility] Electronic Signature(s) Signed: 11/09/2019 6:04:52 PM By: Kela Millin Signed: 11/12/2019 9:07:11 AM By: Linton Ham MD Entered By: Kela Millin on 11/09/2019 14:08:16 -------------------------------------------------------------------------------- Problem List Details Patient Name: Date of Service: [name], [name] 11/09/2019 1:15 PM Medical Record [id number] Patient Account Number: [id number] Date of Birth/Sex: Treating RN: 02-26-1922 (86 y.o. [name] Primary Care Provider: Hollace Kinnier Other Clinician: Referring Provider: Treating Provider/Extender:Hamdi Vari, Arlyss Queen, Lindell Spar in Treatment: 3 Active Problems ICD-10 Evaluated Encounter Code Description Active  Date Today Diagnosis L89.154 Pressure ulcer of sacral region, stage 4 10/17/2019 No Yes E11.621 Type 2 diabetes mellitus with foot ulcer 10/17/2019 No Yes L97.522 Non-pressure chronic ulcer of other part of left foot 10/17/2019 No Yes with fat layer exposed F01.50 Vascular dementia without behavioral disturbance 10/17/2019 No Yes I10 Essential (primary) hypertension 10/17/2019 No Yes Inactive Problems Resolved Problems Electronic Signature(s) Signed: 11/12/2019 9:07:11 AM By: Linton Ham MD Entered By: Linton Ham on 11/09/2019 14:25:08 -------------------------------------------------------------------------------- Progress Note Details Patient Name: Date of Service: [name], [name] 11/09/2019 1:15 PM Medical Record [id number] Patient Account Number: [id number] Date of Birth/Sex: Treating RN: [birth year] (86 y.o. [name] Primary Care Provider: Hollace Kinnier Other Clinician: Referring Provider: Treating Provider/Extender:Dameion Briles, Arlyss Queen, Lindell Spar in Treatment: 3 Subjective History of Present Illness (HPI) 10/17/2019 upon evaluation today patient presents for initial inspection here in our clinic concerning issues that has been having with a wound in the sacral region as well as the left heel. Fortunately neither appears to be overtly and significantly infected there is some necrotic tissue noted at both locations however that is going to be required to be addressed. He does have a history of diabetes mellitus type 2, vascular dementia, and hypertension. The patient did have a stroke in November 2020 unfortunately he has not been the same since that time according to his daughter. Prior to that he was taking care of himself now he is 100% dependent. He is at home with his daughter who is present during the office visit today as well that is where I get the majority of the history from at this point. Patient does have a history of hypertension as well which  does not appear to be too out of control today which is good news. He does have a hemoglobin A1c of 6.05 May 2019. They currently have been using a calcium alginate dressing to the sacrum and it sounds like Skin-Prep and a foam on the heel. 4/9; pressure ulcers on the sacral area and left heel. Been using Anasept wet-to-dry on the sacrum and Hydrofera Blue in the heel. He has home health family is helping with the  dressing. It sounds as though they are fairly religious about offloading these areas that have pressure relief surfaces for the bed. Wounds look better Objective Constitutional Sitting or standing Blood Pressure is within target range for patient.. Pulse regular and within target range for patient.[name] Respirations regular, non-labored and within target range.. Temperature is normal and within the target range for the patient.[name] Appears in no distress. Vitals Time Taken: 1:38 PM, Height: 71 in, Weight: 175 lbs, BMI: 24.4, Temperature: 97.7 F, Pulse: 114 bpm, Respiratory Rate: 18 breaths/min, Blood Pressure: 110/59 mmHg, Capillary Blood Glucose: 147 mg/dl. General Notes: glucose per pt report Respiratory work of breathing is normal. Psychiatric Severe dementia. General Notes: Wound exam; he has healthy granulation on the wound on the sacrum. There is undermining at about 12-4 at about 2.3 cm. No exposed bone no surrounding erythema. ooThe areas on his heel are superficial and looks as though they are on the way to closing. Integumentary (Hair, Skin) No erythema or tenderness around any of the wounds. Wound #1 status is Open. Original cause of wound was Pressure Injury. The wound is located on the Sacrum. The wound measures 3.2cm length x 2cm width x 1.4cm depth; 5.027cm^2 area and 7.037cm^3 volume. There is muscle and Fat Layer (Subcutaneous Tissue) Exposed exposed. There is no tunneling noted, however, there is undermining starting at 12:00 and ending at 4:00 with a maximum  distance of 2.4cm. There is a medium amount of serosanguineous drainage noted. The wound margin is well defined and not attached to the wound base. There is large (67-100%) red granulation within the wound bed. There is a small (1-33%) amount of necrotic tissue within the wound bed including Adherent Slough. Wound #2 status is Open. Original cause of wound was Pressure Injury. The wound is located on the Left Calcaneus. The wound measures 0.7cm length x 0.6cm width x 0.1cm depth; 0.33cm^2 area and 0.033cm^3 volume. There is Fat Layer (Subcutaneous Tissue) Exposed exposed. There is no tunneling or undermining noted. There is a small amount of serosanguineous drainage noted. The wound margin is flat and intact. There is large (67- 100%) red, pink granulation within the wound bed. There is a small (1-33%) amount of necrotic tissue within the wound bed including Adherent Slough. Assessment Active Problems ICD-10 Pressure ulcer of sacral region, stage 4 Type 2 diabetes mellitus with foot ulcer Non-pressure chronic ulcer of other part of left foot with fat layer exposed Vascular dementia without behavioral disturbance Essential (primary) hypertension Plan Follow-up Appointments: Return appointment in 3 weeks. Dressing Change Frequency: Wound #1 Sacrum: Change dressing every day. Wound #2 Left Calcaneus: Change dressing three times week. Skin Barriers/Peri-Wound Care: Wound #1 Sacrum: Skin Prep - to periwound Wound Cleansing: Wound #1 Sacrum: Clean wound with Wound Cleanser Wound #2 Left Calcaneus: Clean wound with Wound Cleanser Primary Wound Dressing: Wound #1 Sacrum: Other: - Vashe moistened gauze packing (use anasepr in clinic) Wound #2 Left Calcaneus: Hydrofera Blue - ready Secondary Dressing: Wound #1 Sacrum: ABD pad - secured with tegaderm to keep dressing from getting soiled Wound #2 Left Calcaneus: Kerlix/Rolled Gauze Dry Gauze Heel Cup Off-Loading: Turn and  reposition every 2 hours Other: - float heels with pillows under calves while in bed Home Health: [facility] for wound care. - [facility] 1. Hydrofera Blue on the heel 2. Continue with Anasept wet-to-dry in the sacrum 3. I would probably move to a silver collagen based dressing in 3 weeks depending on how this looks on the sacrum. I  doubt he would be a candidate for a wound VAC Electronic Signature(s) Signed: 11/12/2019 9:07:11 AM By: Linton Ham MD Entered By: Linton Ham on 11/09/2019 14:29:41 -------------------------------------------------------------------------------- SuperBill Details Patient Name: Date of Service: [name], [name] 11/09/2019 Medical Record [id number] Patient Account Number: [id number] Date of Birth/Sex: Treating RN: 02/21/1922 (86 y.o. [name] Primary Care Provider: Hollace Kinnier Other Clinician: Referring Provider: Treating Provider/Extender:Astryd Pearcy, Arlyss Queen, Lindell Spar in Treatment: 3 Diagnosis Coding ICD-10 Codes Code Description L89.154 Pressure ulcer of sacral region, stage 4 E11.621 Type 2 diabetes mellitus with foot ulcer L97.522 Non-pressure chronic ulcer of other part of left foot with fat layer exposed F01.50 Vascular dementia without behavioral disturbance I10 Essential (primary) hypertension Facility Procedures CPT4 Code: [id number] Description: 99214 - WOUND CARE VISIT-LEV 4 EST PT Modifier: Quantity: 1 Physician Procedures CPT4 Code Description: [id number] - WC PHYS LEVEL 3 - EST PT ICD-10 Diagnosis Description L89.154 Pressure ulcer of sacral region, stage 4 L97.522 Non-pressure chronic ulcer of other part of left foot with Modifier: fat layer ex Quantity: 1 posed Electronic Signature(s) Signed: 11/12/2019 9:07:11 AM By: Linton Ham MD Entered By: Linton Ham on 11/09/2019 14:30:01

## 2019-11-12 NOTE — Telephone Encounter (Signed)
Patient daughter, [name] called requesting a letter to be written for [employer]. Stated that she is trying to be a Payee with [employer] and they told her that she needed a letter from patient's dr stating that he can no longer manage his benefits.  Please Advise.

## 2019-11-12 NOTE — Telephone Encounter (Signed)
rx sent to pharmacy by e-script  

## 2019-11-12 NOTE — Telephone Encounter (Signed)
Letter completed.  Will bring to you.

## 2019-11-12 NOTE — Progress Notes (Signed)
[name], [name] ([id number]) Visit Report for 11/09/2019 Arrival Information Details Patient Name: Date of Service: [name], [name] 11/09/2019 1:15 PM Medical Record [id number] Patient Account Number: [id number] Date of Birth/Sex: Treating RN: 09-Oct-1921 (86 y.o. [name] Primary Care Cypress Hinkson: Hollace Kinnier Other Clinician: Referring Aahil Fredin: Treating Geralda Baumgardner/Extender:Robson, Arlyss Queen, Lindell Spar in Treatment: 3 Visit Information History Since Last Visit Added or deleted any medications: No Patient Arrived: Wheel Chair Any new allergies or adverse reactions: No Arrival Time: 13:37 Had a fall or experienced change in No Accompanied By: daughter activities of daily living that may affect Transfer Assistance: Harrel Lemon Lift risk of falls: Patient Identification Verified: Yes Signs or symptoms of abuse/neglect since last No Secondary Verification Process Yes visito Completed: Hospitalized since last visit: No Patient Requires Transmission-Based No Implantable device outside of the clinic excluding No Precautions: cellular tissue based products placed in the center Patient Has Alerts: Yes since last visit: Patient Alerts: Patient on Blood Has Dressing in Place as Prescribed: Yes Thinner Pain Present Now: No L ABI non compressible Electronic Signature(s) Signed: 11/12/2019 6:12:10 PM By: Levan Hurst RN, BSN Entered By: Levan Hurst on 11/09/2019 13:37:42 -------------------------------------------------------------------------------- Clinic Level of Care Assessment Details Patient Name: Date of Service: [name], [name] 11/09/2019 1:15 PM Medical Record [id number] Patient Account Number: [id number] Date of Birth/Sex: Treating RN: [birth year] (86 y.o. Marvis Repress Primary Care Rhylie Stehr: Hollace Kinnier Other Clinician: Referring Jawanda Passey: Treating Jeorgia Helming/Extender:Robson, Arlyss Queen, Lindell Spar in Treatment: 3 Clinic Level of Care  Assessment Items TOOL 4 Quantity Score X - Use when only an EandM is performed on FOLLOW-UP visit 1 0 ASSESSMENTS - Nursing Assessment / Reassessment X - Reassessment of Co-morbidities (includes updates in patient status) 1 10 X - Reassessment of Adherence to Treatment Plan 1 5 ASSESSMENTS - Wound and Skin Assessment / Reassessment []  - Simple Wound Assessment / Reassessment - one wound 0 X - Complex Wound Assessment / Reassessment - multiple wounds 2 5 []  - Dermatologic / Skin Assessment (not related to wound area) 0 ASSESSMENTS - Focused Assessment X - Circumferential Edema Measurements - multi extremities 1 5 []  - Nutritional Assessment / Counseling / Intervention 0 []  - Lower Extremity Assessment (monofilament, tuning fork, pulses) 0 []  - Peripheral Arterial Disease Assessment (using hand held doppler) 0 ASSESSMENTS - Ostomy and/or Continence Assessment and Care []  - Incontinence Assessment and Management 0 []  - Ostomy Care Assessment and Management (repouching, etc.) 0 PROCESS - Coordination of Care X - Simple Patient / Family Education for ongoing care 1 15 []  - Complex (extensive) Patient / Family Education for ongoing care 0 X - Staff obtains Programmer, systems, Records, Test Results / Process Orders 1 10 X - Staff telephones HHA, Nursing Homes / Clarify orders / etc 1 10 []  - Routine Transfer to another Facility (non-emergent condition) 0 []  - Routine Hospital Admission (non-emergent condition) 0 []  - New Admissions / Biomedical engineer / Ordering NPWT, Apligraf, etc. 0 []  - Emergency Hospital Admission (emergent condition) 0 X - Simple Discharge Coordination 1 10 []  - Complex (extensive) Discharge Coordination 0 PROCESS - Special Needs []  - Pediatric / Minor Patient Management 0 []  - Isolation Patient Management 0 []  - Hearing / Language / Visual special needs 0 []  - Assessment of Community assistance (transportation, D/C planning, etc.) 0 []  - Additional assistance / Altered  mentation 0 []  - Support Surface(s) Assessment (bed, cushion, seat, etc.) 0 INTERVENTIONS - Wound Cleansing / Measurement []  - Simple Wound Cleansing - one wound 0 X - Complex  Wound Cleansing - multiple wounds 2 5 X - Wound Imaging (photographs - any number of wounds) 1 5 []  - Wound Tracing (instead of photographs) 0 []  - Simple Wound Measurement - one wound 0 X - Complex Wound Measurement - multiple wounds 2 5 INTERVENTIONS - Wound Dressings X - Small Wound Dressing one or multiple wounds 2 10 []  - Medium Wound Dressing one or multiple wounds 0 []  - Large Wound Dressing one or multiple wounds 0 X - Application of Medications - topical 1 5 []  - Application of Medications - injection 0 INTERVENTIONS - Miscellaneous []  - External ear exam 0 []  - Specimen Collection (cultures, biopsies, blood, body fluids, etc.) 0 []  - Specimen(s) / Culture(s) sent or taken to Lab for analysis 0 X - Patient Transfer (multiple staff / Civil Service fast streamer / Similar devices) 1 10 []  - Simple Staple / Suture removal (25 or less) 0 []  - Complex Staple / Suture removal (26 or more) 0 []  - Hypo / Hyperglycemic Management (close monitor of Blood Glucose) 0 []  - Ankle / Brachial Index (ABI) - do not check if billed separately 0 X - Vital Signs 1 5 Has the patient been seen at the hospital within the last three years: Yes Total Score: 140 Level Of Care: New/Established - Level 4 Electronic Signature(s) Signed: 11/09/2019 6:04:52 PM By: Kela Millin Entered By: Kela Millin on 11/09/2019 14:28:31 -------------------------------------------------------------------------------- Encounter Discharge Information Details Patient Name: Date of Service: [name], [name] 11/09/2019 1:15 PM Medical Record [id number] Patient Account Number: [id number] Date of Birth/Sex: Treating RN: [birth year] (86 y.o. [name] Primary Care Martino Tompson: Hollace Kinnier Other Clinician: Referring Bradlee Heitman: Treating  Eleonora Peeler/Extender:Robson, Arlyss Queen, Lindell Spar in Treatment: 3 Encounter Discharge Information Items Discharge Condition: Stable Ambulatory Status: Walker Discharge Destination: Home Transportation: Private Auto Accompanied By: daughter Schedule Follow-up Appointment: Yes Clinical Summary of Care: Patient Declined Electronic Signature(s) Signed: 11/12/2019 6:12:10 PM By: Levan Hurst RN, BSN Entered By: Levan Hurst on 11/09/2019 18:16:02 -------------------------------------------------------------------------------- Lower Extremity Assessment Details Patient Name: Date of Service: [name], [name] 11/09/2019 1:15 PM Medical Record [id number] Patient Account Number: [id number] Date of Birth/Sex: Treating RN: [birth year] (86 y.o. [name] Primary Care Nesta Scaturro: Hollace Kinnier Other Clinician: Referring Kazoua Gossen: Treating Bryndon Cumbie/Extender:Robson, Arlyss Queen, Tiffany Weeks in Treatment: 3 Edema Assessment Assessed: [Left: No] [Right: No] Edema: [Left: N] [Right: o] V[Left: ascular Assessmen] [Right: t] Left: [Left: Right] [Right: :] P[Left: ulses] [Right: :] [Left: Dorsalis Pedi] [Right: s] Palpable: [Left: Ye] [Right: s] Electronic Signature(s) Signed: 11/12/2019 6:12:10 PM By: Levan Hurst RN, BSN Entered By: Levan Hurst on 11/09/2019 13:55:35 -------------------------------------------------------------------------------- Multi Wound Chart Details Patient Name: Date of Service: [name], [name] 11/09/2019 1:15 PM Medical Record [id number] Patient Account Number: [id number] Date of Birth/Sex: Treating RN: [birth year] (86 y.o. Marvis Repress Primary Care Reis Goga: Hollace Kinnier Other Clinician: Referring Danilo Cappiello: Treating Yarielys Beed/Extender:Robson, Arlyss Queen, Tiffany Weeks in Treatment: 3 Vital Signs Height(in): 34 Capillary Blood 147 Glucose(mg/dl): Weight(lbs): 175 Pulse(bpm): 114 Body Mass Index(BMI): 24 Blood Pressure(mmHg):  110/59 Temperature(F): 97.7 Respiratory 18 Rate(breaths/min): Photos: [1:No Photos] [2:No Photos] [N/A:N/A] Wound Location: [1:Sacrum] [2:Left Calcaneus] [N/A:N/A] Wounding Event: [1:Pressure Injury] [2:Pressure Injury] [N/A:N/A] Primary Etiology: [1:Pressure Ulcer] [2:Diabetic Wound/Ulcer of the N/A Lower Extremity] Secondary Etiology: [1:N/A] [2:Pressure Ulcer] [N/A:N/A] Comorbid History: [1:Glaucoma, Chronic Obstructive Pulmonary Disease (COPD), Coronary Disease (COPD), Coronary Artery Disease, Hypertension, Type II Diabetes, Osteoarthritis, Diabetes, Osteoarthritis, Dementia] [2:Glaucoma, Chronic Obstructive Pulmonary  Artery Disease, Hypertension, Type II Dementia] [N/A:N/A] Date Acquired: [1:09/03/2019] [2:10/01/2019] [N/A:N/A] Weeks of Treatment: [1:3] [2:3] [  N/A:N/A] Wound Status: [1:Open] [2:Open] [N/A:N/A] Measurements L x W x D 3.2x2x1.4 [2:0.7x0.6x0.1] [N/A:N/A] (cm) Area (cm) : [1:5.027] [2:0.33] [N/A:N/A] Volume (cm) : [1:7.037] [2:0.033] [N/A:N/A] % Reduction in Area: [1:42.30%] [2:96.00%] [N/A:N/A] % Reduction in Volume: 52.50% [2:96.00%] [N/A:N/A] Starting Position 1 12 (o'clock): Ending Position 1 [1:4] (o'clock): Maximum Distance 1 [1:2.4] (cm): Undermining: [1:Yes] [2:No] [N/A:N/A] Classification: [1:Category/Stage IV] [2:Grade 2] [N/A:N/A] Exudate Amount: [1:Medium] [2:Small] [N/A:N/A] Exudate Type: [1:Serosanguineous] [2:Serosanguineous] [N/A:N/A] Exudate Color: [1:red, brown] [2:red, brown] [N/A:N/A] Wound Margin: [1:Well defined, not attached Flat and Intact] [N/A:N/A] Granulation Amount: [1:Large (67-100%)] [2:Large (67-100%)] [N/A:N/A] Granulation Quality: [1:Red] [2:Red, Pink] [N/A:N/A] Necrotic Amount: [1:Small (1-33%)] [2:Small (1-33%)] Exposed Structures: [1:Fat Layer (Subcutaneous Tissue) Exposed: Yes Muscle: Yes Fascia: No Tendon: No Joint: No Bone: No Small (1-33%)] [2:Fat Layer (Subcutaneous Tissue) Exposed: Yes Fascia: No Tendon: No Muscle: No  Joint: No Bone: No Large (67-100%)] Treatment Notes Electronic Signature(s) Signed: 11/09/2019 6:04:52 PM By: Kela Millin Signed: 11/12/2019 9:07:11 AM By: Linton Ham MD Entered By: Linton Ham on 11/09/2019 14:25:24 -------------------------------------------------------------------------------- Multi-Disciplinary Care Plan Details Patient Name: Date of Service: [name], [name] 11/09/2019 1:15 PM Medical Record [id number] Patient Account Number: [id number] Date of Birth/Sex: Treating RN: Nov 29, 1921 (86 y.o. Marvis Repress Primary Care Bryanah Sidell: Hollace Kinnier Other Clinician: Referring Antion Andres: Treating Kyanna Mahrt/Extender:Robson, Arlyss Queen, Lindell Spar in Treatment: 3 Active Inactive Abuse / Safety / Falls / Self Care Management Nursing Diagnoses: Potential for falls Goals: Patient/caregiver will verbalize/demonstrate measures taken to prevent injury and/or falls Date Initiated: 10/17/2019 Target Resolution Date: 11/14/2019 Goal Status: Active Interventions: Assess fall risk on admission and as needed Assess: immobility, friction, shearing, incontinence upon admission and as needed Assess impairment of mobility on admission and as needed per policy Notes: Nutrition Nursing Diagnoses: Imbalanced nutrition Potential for alteratiion in Nutrition/Potential for imbalanced nutrition Goals: Patient/caregiver agrees to and verbalizes understanding of need to use nutritional supplements and/or vitamins as prescribed Date Initiated: 10/17/2019 Target Resolution Date: 11/14/2019 Goal Status: Active Interventions: Assess patient nutrition upon admission and as needed per policy Treatment Activities: Patient referred to Primary Care Physician for further nutritional evaluation : 10/17/2019 Notes: Pressure Nursing Diagnoses: Knowledge deficit related to causes and risk factors for pressure ulcer development Knowledge deficit related to management of pressures  ulcers Goals: Patient/caregiver will verbalize understanding of pressure ulcer management Date Initiated: 10/17/2019 Target Resolution Date: 11/14/2019 Goal Status: Active Interventions: Assess: immobility, friction, shearing, incontinence upon admission and as needed Assess offloading mechanisms upon admission and as needed Assess potential for pressure ulcer upon admission and as needed Provide education on pressure ulcers Notes: Wound/Skin Impairment Nursing Diagnoses: Impaired tissue integrity Knowledge deficit related to ulceration/compromised skin integrity Goals: Patient/caregiver will verbalize understanding of skin care regimen Date Initiated: 10/17/2019 Target Resolution Date: 11/14/2019 Goal Status: Active Ulcer/skin breakdown will have a volume reduction of 30% by week 4 Date Initiated: 10/17/2019 Target Resolution Date: 11/14/2019 Goal Status: Active Interventions: Assess patient/caregiver ability to obtain necessary supplies Assess patient/caregiver ability to perform ulcer/skin care regimen upon admission and as needed Assess ulceration(s) every visit Provide education on ulcer and skin care Treatment Activities: Skin care regimen initiated : 10/17/2019 Topical wound management initiated : 10/17/2019 Notes: Electronic Signature(s) Signed: 11/09/2019 6:04:52 PM By: Kela Millin Entered By: Kela Millin on 11/09/2019 13:40:05 -------------------------------------------------------------------------------- Pain Assessment Details Patient Name: Date of Service: NAZIRE, CHISMAN 11/09/2019 1:15 PM Medical Record [id number] Patient Account Number: [id number] Date of Birth/Sex: Treating RN: [birth year] (86 y.o. [name] Primary Care Eleanna Theilen: Hollace Kinnier Other Clinician: Referring Rohan Juenger:  Treating Nakoma Gotwalt/Extender:Robson, Arlyss Queen, Tiffany Weeks in Treatment: 3 Active Problems Location of Pain Severity and Description of Pain Patient Has  Paino No Site Locations Pain Management and Medication Current Pain Management: Electronic Signature(s) Signed: 11/12/2019 6:12:10 PM By: Levan Hurst RN, BSN Entered By: Levan Hurst on 11/09/2019 13:38:51 -------------------------------------------------------------------------------- Patient/Caregiver Education Details Patient Name: Date of Service: Domenick Gong 4/9/2021andnbsp1:15 PM Medical Record [id number] Patient Account Number: [id number] Date of Birth/Gender: Treating RN: 1922/02/15 (86 y.o. Marvis Repress Primary Care Physician: Hollace Kinnier Other Clinician: Referring Physician: Treating Physician/Extender:Robson, Arlyss Queen, Lindell Spar in Treatment: 3 Education Assessment Education Provided To: Patient Education Topics Provided Pressure: Methods: Explain/Verbal Responses: State content correctly Wound/Skin Impairment: Methods: Explain/Verbal Responses: State content correctly Electronic Signature(s) Signed: 11/09/2019 6:04:52 PM By: Kela Millin Entered By: Kela Millin on 11/09/2019 13:40:21 -------------------------------------------------------------------------------- Wound Assessment Details Patient Name: Date of Service: DAESHAWN, EICHHOLZ 11/09/2019 1:15 PM Medical Record [id number] Patient Account Number: [id number] Date of Birth/Sex: Treating RN: 1922/01/09 (86 y.o. [name] Primary Care Aphrodite Harpenau: Hollace Kinnier Other Clinician: Referring Savanah Bayles: Treating Adama Ivins/Extender:Robson, Arlyss Queen, Tiffany Weeks in Treatment: 3 Wound Status Wound Number: 1 Primary Pressure Ulcer Etiology: Wound Location: Sacrum Wound Open Wounding Event: Pressure Injury Status: Date Acquired: 09/03/2019 Comorbid Glaucoma, Chronic Obstructive Pulmonary Weeks Of Treatment: 3 History: Disease (COPD), Coronary Artery Disease, Clustered Wound: No Hypertension, Type II Diabetes, Osteoarthritis, Dementia Wound  Measurements Length: (cm) 3.2 Width: (cm) 2 Depth: (cm) 1.4 Area: (cm) 5.027 Volume: (cm) 7.037 Wound Description Classification: Category/Stage IV Wound Margin: Well defined, not attached Exudate Amount: Medium Exudate Type: Serosanguineous Exudate Color: red, brown Wound Bed Granulation Amount: Large (67-100%) Granulation Quality: Red Necrotic Amount: Small (1-33%) Necrotic Quality: Adherent Slough Foul Odor After Cleansing: No Slough/Fibrino Yes Exposed Structure Fascia Exposed: No Fat Layer (Subcutaneous Tissue) Exposed: Yes Tendon Exposed: No Muscle Exposed: Yes Necrosis of Muscle: No Joint Exposed: No Bone Exposed: No % Reduction in Area: 42.3% % Reduction in Volume: 52.5% Epithelialization: Small (1-33%) Tunneling: No Undermining: Yes Starting Position (o'clock): 12 Ending Position (o'clock): 4 Maximum Distance: (cm) 2.4 Treatment Notes Wound #1 (Sacrum) 1. Cleanse With Wound Cleanser 3. Primary Dressing Applied Other primary dressing (specifiy in notes) 4. Secondary Dressing Foam Border Dressing Notes anasept gauze used in clinic as primary dressing Electronic Signature(s) Signed: 11/12/2019 6:12:10 PM By: Levan Hurst RN, BSN Entered By: Levan Hurst on 11/09/2019 13:55:01 -------------------------------------------------------------------------------- Wound Assessment Details Patient Name: Date of Service: [name], [name] 11/09/2019 1:15 PM Medical Record [id number] Patient Account Number: [id number] Date of Birth/Sex: Treating RN: 04-Feb-1922 (86 y.o. [name] Primary Care Hezzie Karim: Hollace Kinnier Other Clinician: Referring Judith Campillo: Treating Shiloh Swopes/Extender:Robson, Arlyss Queen, Tiffany Weeks in Treatment: 3 Wound Status Wound Number: 2 Primary Diabetic Wound/Ulcer of the Lower Extremity Etiology: Wound Location: Left Calcaneus Secondary Pressure Ulcer Wounding Event: Pressure Injury Etiology: Date Acquired:  10/01/2019 Wound Open Weeks Of Treatment: 3 Status: Clustered Wound: No Comorbid Glaucoma, Chronic Obstructive Pulmonary History: Disease (COPD), Coronary Artery Disease, Hypertension, Type II Diabetes, Osteoarthritis, Dementia Wound Measurements Length: (cm) 0.7 Width: (cm) 0.6 Depth: (cm) 0.1 Area: (cm) 0.33 Volume: (cm) 0.033 Wound Description Classification: Grade 2 Wound Margin: Flat and Intact Exudate Amount: Small Exudate Type: Serosanguineous Exudate Color: red, brown Wound Bed Granulation Amount: Large (67-100%) Granulation Quality: Red, Pink Necrotic Amount: Small (1-33%) Necrotic Quality: Adherent Slough fter Cleansing: No ino No Exposed Structure sed: No Subcutaneous Tissue) Exposed: Yes sed: No sed: No ed: No d: No % Reduction in Area: 96% % Reduction in Volume: 96%  Epithelialization: Large (67-100%) Tunneling: No Undermining: No Foul Odor A Slough/Fibr Fascia Expo Fat Layer ( Tendon Expo Muscle Expo Joint Expos Bone Expose Treatment Notes Wound #2 (Left Calcaneus) 1. Cleanse With Wound Cleanser 3. Primary Dressing Applied Hydrofera Blue 4. Secondary Dressing Dry Gauze Roll Gauze Heel Cup 5. Secured With Recruitment consultant) Signed: 11/12/2019 6:12:10 PM By: Levan Hurst RN, BSN Entered By: Levan Hurst on 11/09/2019 13:55:23 -------------------------------------------------------------------------------- [name] Details Patient Name: Date of Service: TONIA, [name] 11/09/2019 1:15 PM Medical Record [id number] Patient Account Number: [id number] Date of Birth/Sex: Treating RN: Jan 19, 1922 (86 y.o. [name] Primary Care Janique Hoefer: Hollace Kinnier Other Clinician: Referring Ferdinand Revoir: Treating Amandeep Nesmith/Extender:Robson, Arlyss Queen, Tiffany Weeks in Treatment: 3 Vital Signs Time Taken: 13:38 Temperature (F): 97.7 Height (in): 71 Pulse (bpm): 114 Weight (lbs): 175 Respiratory Rate (breaths/min): 18 Body Mass  Index (BMI): 24.4 Blood Pressure (mmHg): 110/59 Capillary Blood Glucose (mg/dl): 147 Reference Range: 80 - 120 mg / dl Notes glucose per pt report Electronic Signature(s) Signed: 11/12/2019 6:12:10 PM By: Levan Hurst RN, BSN Entered By: Levan Hurst on 11/09/2019 13:38:47

## 2019-11-12 NOTE — Telephone Encounter (Signed)
Patient daughter, [name] called requesting Rx to be sent to [facility]. Rx Faxed.   Pended Eliquis for approval due to [place]. Sent to Dr. Mariea Clonts.

## 2019-11-13 ENCOUNTER — Other Ambulatory Visit: Payer: Self-pay

## 2019-11-13 NOTE — Patient Outreach (Signed)
[name]  Ross  11/13/2019   [name] [birth year] [id number]  Subjective: Telephone call to caregiver daughter [name]. She states that patient is doing good. She reports that his wounds are doing good and patient going to the wound center. She states home health continues their involvement but she has requested that they come in the mornings due to her work schedule. She states she has discussed with the nurse but will also communicate it with the office manager as well. Discussed signs of infection and importance of notifying physician.  She reports that patient has not had problems with his sugars, last A1c check was 6.3.  She reports that his appetite is good.  Discussed being mindful of diet control in order to control diabetes.  She verbalized understanding and voices no concerns.    Objective:   Encounter Medications:  Outpatient Encounter Medications as of 11/13/2019  Medication Sig  . albuterol (VENTOLIN HFA) 108 (90 Base) MCG/ACT inhaler Inhale 2 puffs into the lungs every 4 (four) hours as needed for wheezing or shortness of breath. INHALE TWO PUFFS EVERY 4-6 HOURS AS NEEDED FOR DIFFICULTY BREATHING,WHEEZING OR COUGH  . Alcohol Swabs (SURE-PREP ALCOHOL PREP) 70 % PADS 1 each by Does not apply route as needed.  [name] amLODipine (NORVASC) 5 MG tablet Take 1 tablet (5 mg total) by mouth daily.  [name] apixaban (ELIQUIS) 5 MG TABS tablet Take 1 tablet (5 mg total) by mouth 2 (two) times daily.  [name] aspirin EC 81 MG tablet Take 1 tablet (81 mg total) by mouth daily.  . Blood Glucose Monitoring Suppl (ACCU-CHEK AVIVA) device Use to test blood sugar three times daily. Dx E11.51  . escitalopram (LEXAPRO) 5 MG tablet TAKE 1 TABLET EVERY DAY  . glucose blood (ACCU-CHEK AVIVA) test strip Use to test blood sugar three times daily. Dx: E11.51  . hydrochlorothiazide (HYDRODIURIL) 25 MG tablet TAKE 1 TABLET EVERY DAY  . insulin glargine (LANTUS) 100 UNIT/ML  injection Inject 7 Units into the skin daily.  . Insulin Pen Needle (B-D UF III MINI PEN NEEDLES) 31G X 5 MM MISC USE AS DIRECTED.  Elmore Guise Devices (ACCU-CHEK SOFTCLIX) lancets Use as instructed with checking blood sugar three times daily. Dx: E11.51  . Loratadine 10 MG CAPS Take 1 capsule (10 mg total) by mouth daily.  [name] losartan (COZAAR) 50 MG tablet TAKE 1 TABLET EVERY DAY  . Multiple Vitamins-Minerals (SENTRY SENIOR) TABS Take 1 tablet by mouth daily.  [name] NAMZARIC 28-10 MG CP24 TAKE 1 CAPSULE EVERY DAY  . simvastatin (ZOCOR) 20 MG tablet Take 1 tablet (20 mg total) by mouth daily.  . tamsulosin (FLOMAX) 0.4 MG CAPS capsule Take 1 capsule (0.4 mg total) by mouth daily after supper.   No facility-administered encounter medications on file as of 11/13/2019.    Functional Status:  In your present state of health, do you have any difficulty performing the following activities: 07/16/2019 07/06/2019  Hearing? N Y  Vision? N N  Difficulty concentrating or making decisions? [name]  Comment Patient needs assistance with all aspects of care. -  Walking or climbing stairs? Y Y  Dressing or bathing? Y Y  Doing errands, shopping? [name]  Preparing Food and eating ? Y -  Comment needs help with all aspects of care. -  Using the Toilet? Y -  In the past six months, have you accidently leaked urine? Y -  Do you have problems with loss of bowel  control? Y -  Managing your Medications? Y -  Managing your Finances? Y -  Housekeeping or managing your Housekeeping? Y -  Some recent data might be hidden    Fall/Depression Screening: Fall Risk  11/13/2019 11/09/2019 10/12/2019  Falls in the past year? 0 0 0  Number falls in past yr: - 0 0  Injury with Fall? - 0 0  Comment - - -  Risk Factor Category  - - -  Risk for fall due to : - - -  Follow up - - -   PHQ 2/9 Scores 11/09/2019 10/03/2019 07/16/2019 07/16/2019 01/22/2019 12/01/2018 04/26/2018  PHQ - 2 Score 0 0 - - 0 0 0  Exception Documentation - - Other-  indicate reason in comment box Other- indicate reason in comment box - - -  Not completed - - - unable to assess - - -    Assessment: Daughter continues to manage patient and his chronic illnesses.    Plan:  [facility] CM Care Plan Problem One     Most Recent Value  Care Plan Problem One  Knowledge deficit diabetes [name]  Role Documenting the Problem One  Care [name] Telephonic Coordinator  Care Plan for Problem One  Active  [facility] Long Term Goal   Patient A1c will remain less than 6.5 within 90 days.  [facility] Long Term Goal Start Date  11/13/19  Interventions for Problem One Long Term Goal  RN CM discussed diabetes and bllod sugar control.   [facility] CM Short Term Goal #2   Daughter will be able to report sacral wound improvement within 29 days.    [facility] CM Short Term Goal #2 Start Date  10/10/19  [facility] CM Short Term Goal #2 Met Date  11/13/19  [facility] CM Short Term Goal #3  Daughter will report checking blood sugar at least weekly within 29 days.    [facility] CM Short Term Goal #3 Start Date  10/10/19  [facility] CM Short Term Goal #3 Met Date  11/13/19        RN CM will contact again in the month of June and daughter agreeable.   Jone Baseman, RN, MSN [place] [name] Care [name] Coordinator Direct Line [phone] Cell [phone] Toll Free: [phone]  Fax: [phone]

## 2019-11-13 NOTE — Telephone Encounter (Signed)
[name], daughter notified and will pick up from office. Left up front for pick up

## 2019-11-14 ENCOUNTER — Telehealth: Payer: Self-pay | Admitting: *Deleted

## 2019-11-14 DIAGNOSIS — I2699 Other pulmonary embolism without acute cor pulmonale: Secondary | ICD-10-CM | POA: Diagnosis not present

## 2019-11-14 DIAGNOSIS — I5032 Chronic diastolic (congestive) heart failure: Secondary | ICD-10-CM | POA: Diagnosis not present

## 2019-11-14 DIAGNOSIS — E11621 Type 2 diabetes mellitus with foot ulcer: Secondary | ICD-10-CM | POA: Diagnosis not present

## 2019-11-14 DIAGNOSIS — I130 Hypertensive heart and chronic kidney disease with heart failure and stage 1 through stage 4 chronic kidney disease, or unspecified chronic kidney disease: Secondary | ICD-10-CM | POA: Diagnosis not present

## 2019-11-14 DIAGNOSIS — L97422 Non-pressure chronic ulcer of left heel and midfoot with fat layer exposed: Secondary | ICD-10-CM | POA: Diagnosis not present

## 2019-11-14 DIAGNOSIS — L89154 Pressure ulcer of sacral region, stage 4: Secondary | ICD-10-CM | POA: Diagnosis not present

## 2019-11-14 DIAGNOSIS — E1122 Type 2 diabetes mellitus with diabetic chronic kidney disease: Secondary | ICD-10-CM | POA: Diagnosis not present

## 2019-11-14 DIAGNOSIS — F0391 Unspecified dementia with behavioral disturbance: Secondary | ICD-10-CM | POA: Diagnosis not present

## 2019-11-14 DIAGNOSIS — N183 Chronic kidney disease, stage 3 unspecified: Secondary | ICD-10-CM | POA: Diagnosis not present

## 2019-11-14 NOTE — Telephone Encounter (Signed)
That is fine, but he may pull the condom cath right off and have urine in his wound anyway.  We'll see how it goes.

## 2019-11-14 NOTE — Telephone Encounter (Signed)
[name] notified and agreed.

## 2019-11-14 NOTE — Telephone Encounter (Signed)
[name] with [facility] called and stated that a foley Cath was ordered for patient because he has a wound and trying to keep it dry. Daughter is requesting the foley Cath to be changed to a Condom Cath instead because patient pulls at it all the time. Nurse is calling to let you know the change.

## 2019-11-15 ENCOUNTER — Encounter: Payer: Self-pay | Admitting: Internal Medicine

## 2019-11-15 DIAGNOSIS — R6889 Other general symptoms and signs: Secondary | ICD-10-CM | POA: Diagnosis not present

## 2019-11-16 DIAGNOSIS — R6889 Other general symptoms and signs: Secondary | ICD-10-CM | POA: Diagnosis not present

## 2019-11-16 DIAGNOSIS — F0391 Unspecified dementia with behavioral disturbance: Secondary | ICD-10-CM | POA: Diagnosis not present

## 2019-11-16 DIAGNOSIS — I130 Hypertensive heart and chronic kidney disease with heart failure and stage 1 through stage 4 chronic kidney disease, or unspecified chronic kidney disease: Secondary | ICD-10-CM | POA: Diagnosis not present

## 2019-11-16 DIAGNOSIS — E1122 Type 2 diabetes mellitus with diabetic chronic kidney disease: Secondary | ICD-10-CM | POA: Diagnosis not present

## 2019-11-16 DIAGNOSIS — I2699 Other pulmonary embolism without acute cor pulmonale: Secondary | ICD-10-CM | POA: Diagnosis not present

## 2019-11-16 DIAGNOSIS — L97422 Non-pressure chronic ulcer of left heel and midfoot with fat layer exposed: Secondary | ICD-10-CM | POA: Diagnosis not present

## 2019-11-16 DIAGNOSIS — L89154 Pressure ulcer of sacral region, stage 4: Secondary | ICD-10-CM | POA: Diagnosis not present

## 2019-11-16 DIAGNOSIS — N183 Chronic kidney disease, stage 3 unspecified: Secondary | ICD-10-CM | POA: Diagnosis not present

## 2019-11-16 DIAGNOSIS — E11621 Type 2 diabetes mellitus with foot ulcer: Secondary | ICD-10-CM | POA: Diagnosis not present

## 2019-11-16 DIAGNOSIS — I5032 Chronic diastolic (congestive) heart failure: Secondary | ICD-10-CM | POA: Diagnosis not present

## 2019-11-19 ENCOUNTER — Telehealth: Payer: Self-pay | Admitting: *Deleted

## 2019-11-19 DIAGNOSIS — E1122 Type 2 diabetes mellitus with diabetic chronic kidney disease: Secondary | ICD-10-CM | POA: Diagnosis not present

## 2019-11-19 DIAGNOSIS — L89154 Pressure ulcer of sacral region, stage 4: Secondary | ICD-10-CM | POA: Diagnosis not present

## 2019-11-19 DIAGNOSIS — N183 Chronic kidney disease, stage 3 unspecified: Secondary | ICD-10-CM | POA: Diagnosis not present

## 2019-11-19 DIAGNOSIS — F0391 Unspecified dementia with behavioral disturbance: Secondary | ICD-10-CM | POA: Diagnosis not present

## 2019-11-19 DIAGNOSIS — I2699 Other pulmonary embolism without acute cor pulmonale: Secondary | ICD-10-CM | POA: Diagnosis not present

## 2019-11-19 DIAGNOSIS — E11621 Type 2 diabetes mellitus with foot ulcer: Secondary | ICD-10-CM | POA: Diagnosis not present

## 2019-11-19 DIAGNOSIS — I130 Hypertensive heart and chronic kidney disease with heart failure and stage 1 through stage 4 chronic kidney disease, or unspecified chronic kidney disease: Secondary | ICD-10-CM | POA: Diagnosis not present

## 2019-11-19 DIAGNOSIS — I5032 Chronic diastolic (congestive) heart failure: Secondary | ICD-10-CM | POA: Diagnosis not present

## 2019-11-19 DIAGNOSIS — L97422 Non-pressure chronic ulcer of left heel and midfoot with fat layer exposed: Secondary | ICD-10-CM | POA: Diagnosis not present

## 2019-11-19 NOTE — Telephone Encounter (Signed)
[name], nurse with [facility] called and stated that the daughter was concerned that patient has not had any labs in our office in over a year. Nurse was wondering if these should be drawn.   I explained to the nurse that patient had labs done in the hospital in December that we can see in his chart. Also informed her that patient has a follow up with our office 01/17/2020 with Dr. Mariea Clonts.   She wants to know if patient needs any labs done before then. And if so, if they need to draw or he can get them done at his appointment 6/17. Please Advise.

## 2019-11-19 NOTE — Telephone Encounter (Signed)
[name] Notified and will Draw labs on Thursday.

## 2019-11-19 NOTE — Telephone Encounter (Signed)
Ok to do cbc, cmp, hba1c due to failure to thrive, diabetes.  I was trying to avoid putting him through a lot because he's near end of life but [name] has not accepted that.

## 2019-11-22 ENCOUNTER — Encounter: Payer: Self-pay | Admitting: Internal Medicine

## 2019-11-22 DIAGNOSIS — I130 Hypertensive heart and chronic kidney disease with heart failure and stage 1 through stage 4 chronic kidney disease, or unspecified chronic kidney disease: Secondary | ICD-10-CM | POA: Diagnosis not present

## 2019-11-22 DIAGNOSIS — N183 Chronic kidney disease, stage 3 unspecified: Secondary | ICD-10-CM | POA: Diagnosis not present

## 2019-11-22 DIAGNOSIS — I2699 Other pulmonary embolism without acute cor pulmonale: Secondary | ICD-10-CM | POA: Diagnosis not present

## 2019-11-22 DIAGNOSIS — R6889 Other general symptoms and signs: Secondary | ICD-10-CM | POA: Diagnosis not present

## 2019-11-22 DIAGNOSIS — F0391 Unspecified dementia with behavioral disturbance: Secondary | ICD-10-CM | POA: Diagnosis not present

## 2019-11-22 DIAGNOSIS — L89154 Pressure ulcer of sacral region, stage 4: Secondary | ICD-10-CM | POA: Diagnosis not present

## 2019-11-22 DIAGNOSIS — E11621 Type 2 diabetes mellitus with foot ulcer: Secondary | ICD-10-CM | POA: Diagnosis not present

## 2019-11-22 DIAGNOSIS — R7309 Other abnormal glucose: Secondary | ICD-10-CM | POA: Diagnosis not present

## 2019-11-22 DIAGNOSIS — I5032 Chronic diastolic (congestive) heart failure: Secondary | ICD-10-CM | POA: Diagnosis not present

## 2019-11-22 DIAGNOSIS — E1122 Type 2 diabetes mellitus with diabetic chronic kidney disease: Secondary | ICD-10-CM | POA: Diagnosis not present

## 2019-11-22 DIAGNOSIS — L97422 Non-pressure chronic ulcer of left heel and midfoot with fat layer exposed: Secondary | ICD-10-CM | POA: Diagnosis not present

## 2019-11-22 DIAGNOSIS — R7989 Other specified abnormal findings of blood chemistry: Secondary | ICD-10-CM | POA: Diagnosis not present

## 2019-11-22 LAB — HEMOGLOBIN A1C: Hemoglobin A1C: 7.1

## 2019-11-22 LAB — CBC AND DIFFERENTIAL
HCT: 24 — AB (ref 41–53)
Hemoglobin: 7.8 — AB (ref 13.5–17.5)
Platelets: 318 (ref 150–399)
WBC: 7.9

## 2019-11-22 LAB — COMPREHENSIVE METABOLIC PANEL
Calcium: 9 (ref 8.7–10.7)
Globulin: 2.6

## 2019-11-22 LAB — BASIC METABOLIC PANEL
BUN: 22 — AB (ref 4–21)
CO2: 32 — AB (ref 13–22)
Chloride: 103 (ref 99–108)
Glucose: 95
Potassium: 4.7 (ref 3.4–5.3)
Sodium: 142 (ref 137–147)

## 2019-11-22 LAB — CBC: RBC: 2.6 — AB (ref 3.87–5.11)

## 2019-11-26 DIAGNOSIS — E1122 Type 2 diabetes mellitus with diabetic chronic kidney disease: Secondary | ICD-10-CM | POA: Diagnosis not present

## 2019-11-26 DIAGNOSIS — F0391 Unspecified dementia with behavioral disturbance: Secondary | ICD-10-CM | POA: Diagnosis not present

## 2019-11-26 DIAGNOSIS — N183 Chronic kidney disease, stage 3 unspecified: Secondary | ICD-10-CM | POA: Diagnosis not present

## 2019-11-26 DIAGNOSIS — L89154 Pressure ulcer of sacral region, stage 4: Secondary | ICD-10-CM | POA: Diagnosis not present

## 2019-11-26 DIAGNOSIS — E11621 Type 2 diabetes mellitus with foot ulcer: Secondary | ICD-10-CM | POA: Diagnosis not present

## 2019-11-26 DIAGNOSIS — I5032 Chronic diastolic (congestive) heart failure: Secondary | ICD-10-CM | POA: Diagnosis not present

## 2019-11-26 DIAGNOSIS — I130 Hypertensive heart and chronic kidney disease with heart failure and stage 1 through stage 4 chronic kidney disease, or unspecified chronic kidney disease: Secondary | ICD-10-CM | POA: Diagnosis not present

## 2019-11-26 DIAGNOSIS — L97422 Non-pressure chronic ulcer of left heel and midfoot with fat layer exposed: Secondary | ICD-10-CM | POA: Diagnosis not present

## 2019-11-26 DIAGNOSIS — I2699 Other pulmonary embolism without acute cor pulmonale: Secondary | ICD-10-CM | POA: Diagnosis not present

## 2019-11-26 IMAGING — DX DG SHOULDER 2+V PORT*R*
2 series · 2 of 2 positions shown · non-contrast
Comparison: None

CLINICAL DATA: RIGHT shoulder pain, no known injury

EXAM:
PORTABLE RIGHT SHOULDER

[shoulder ap]
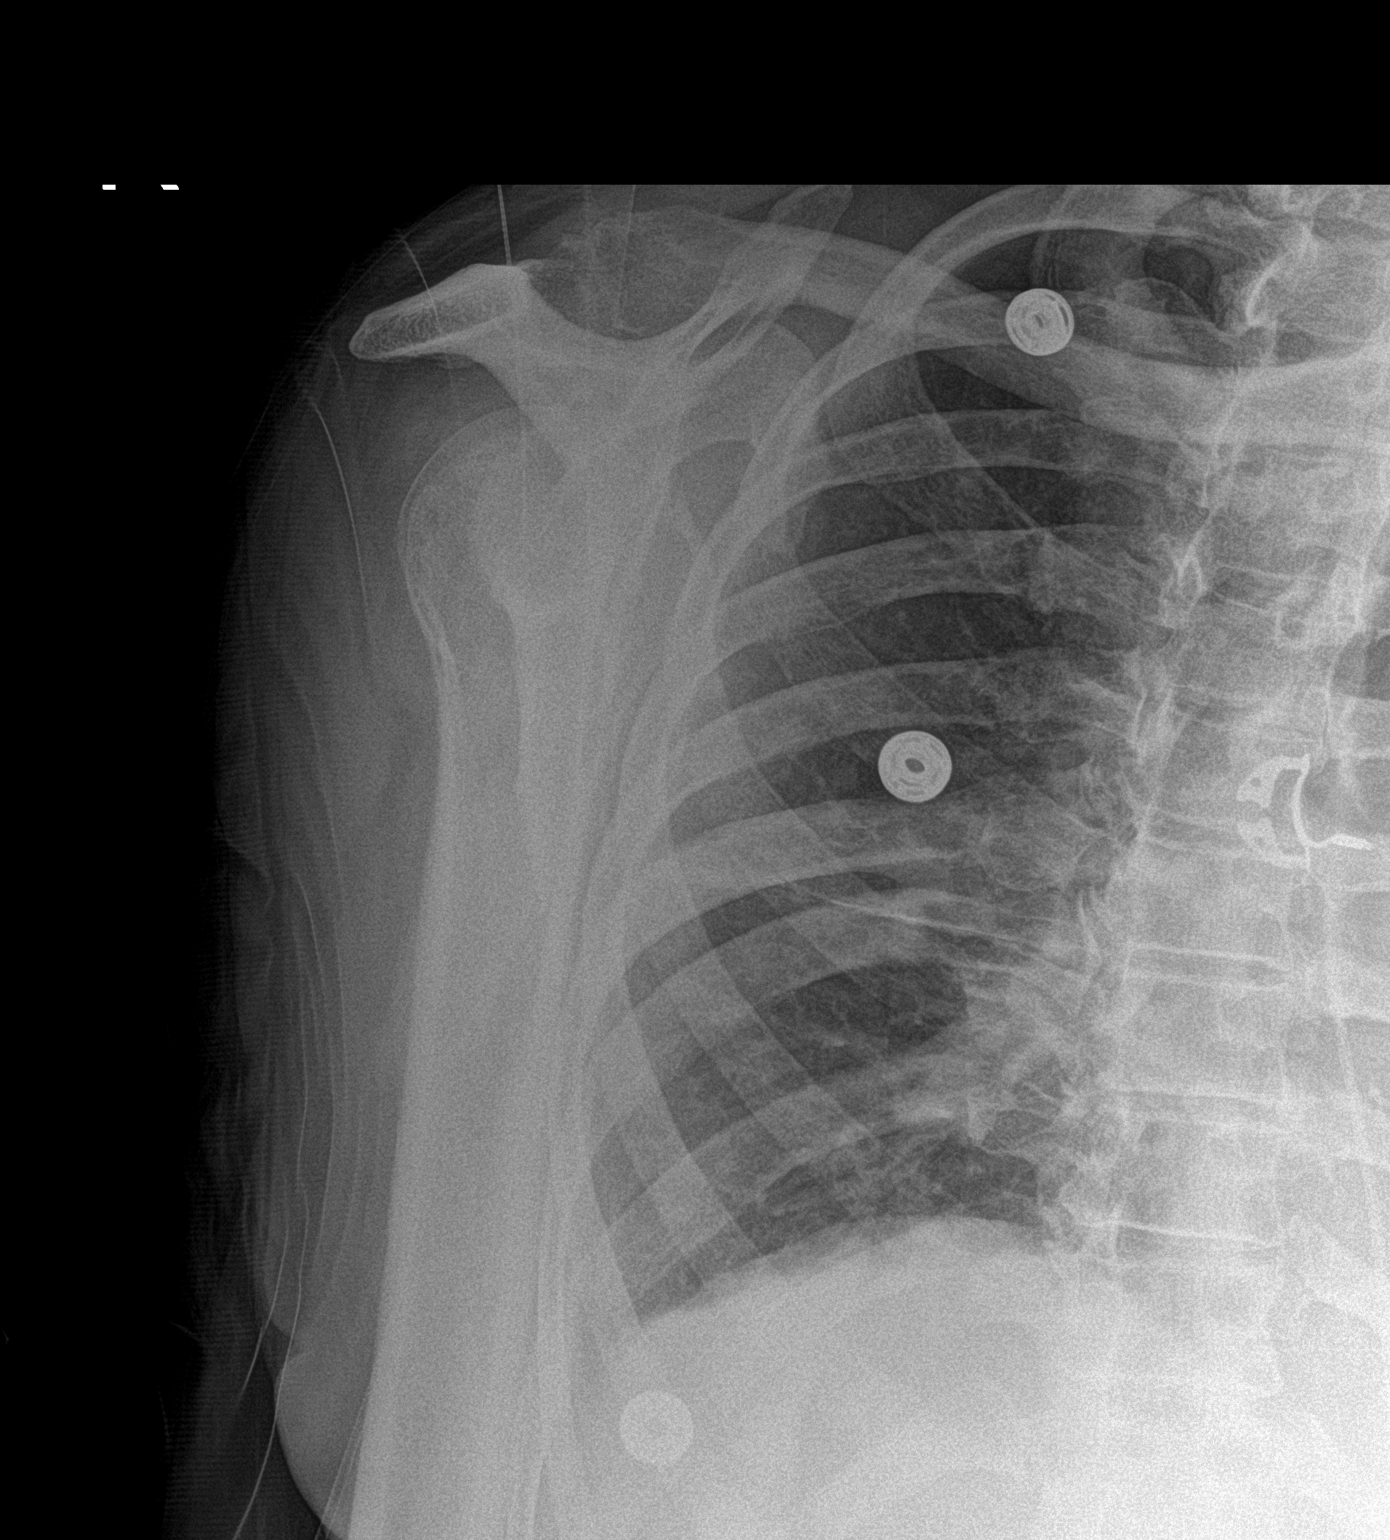

[shoulder obl]
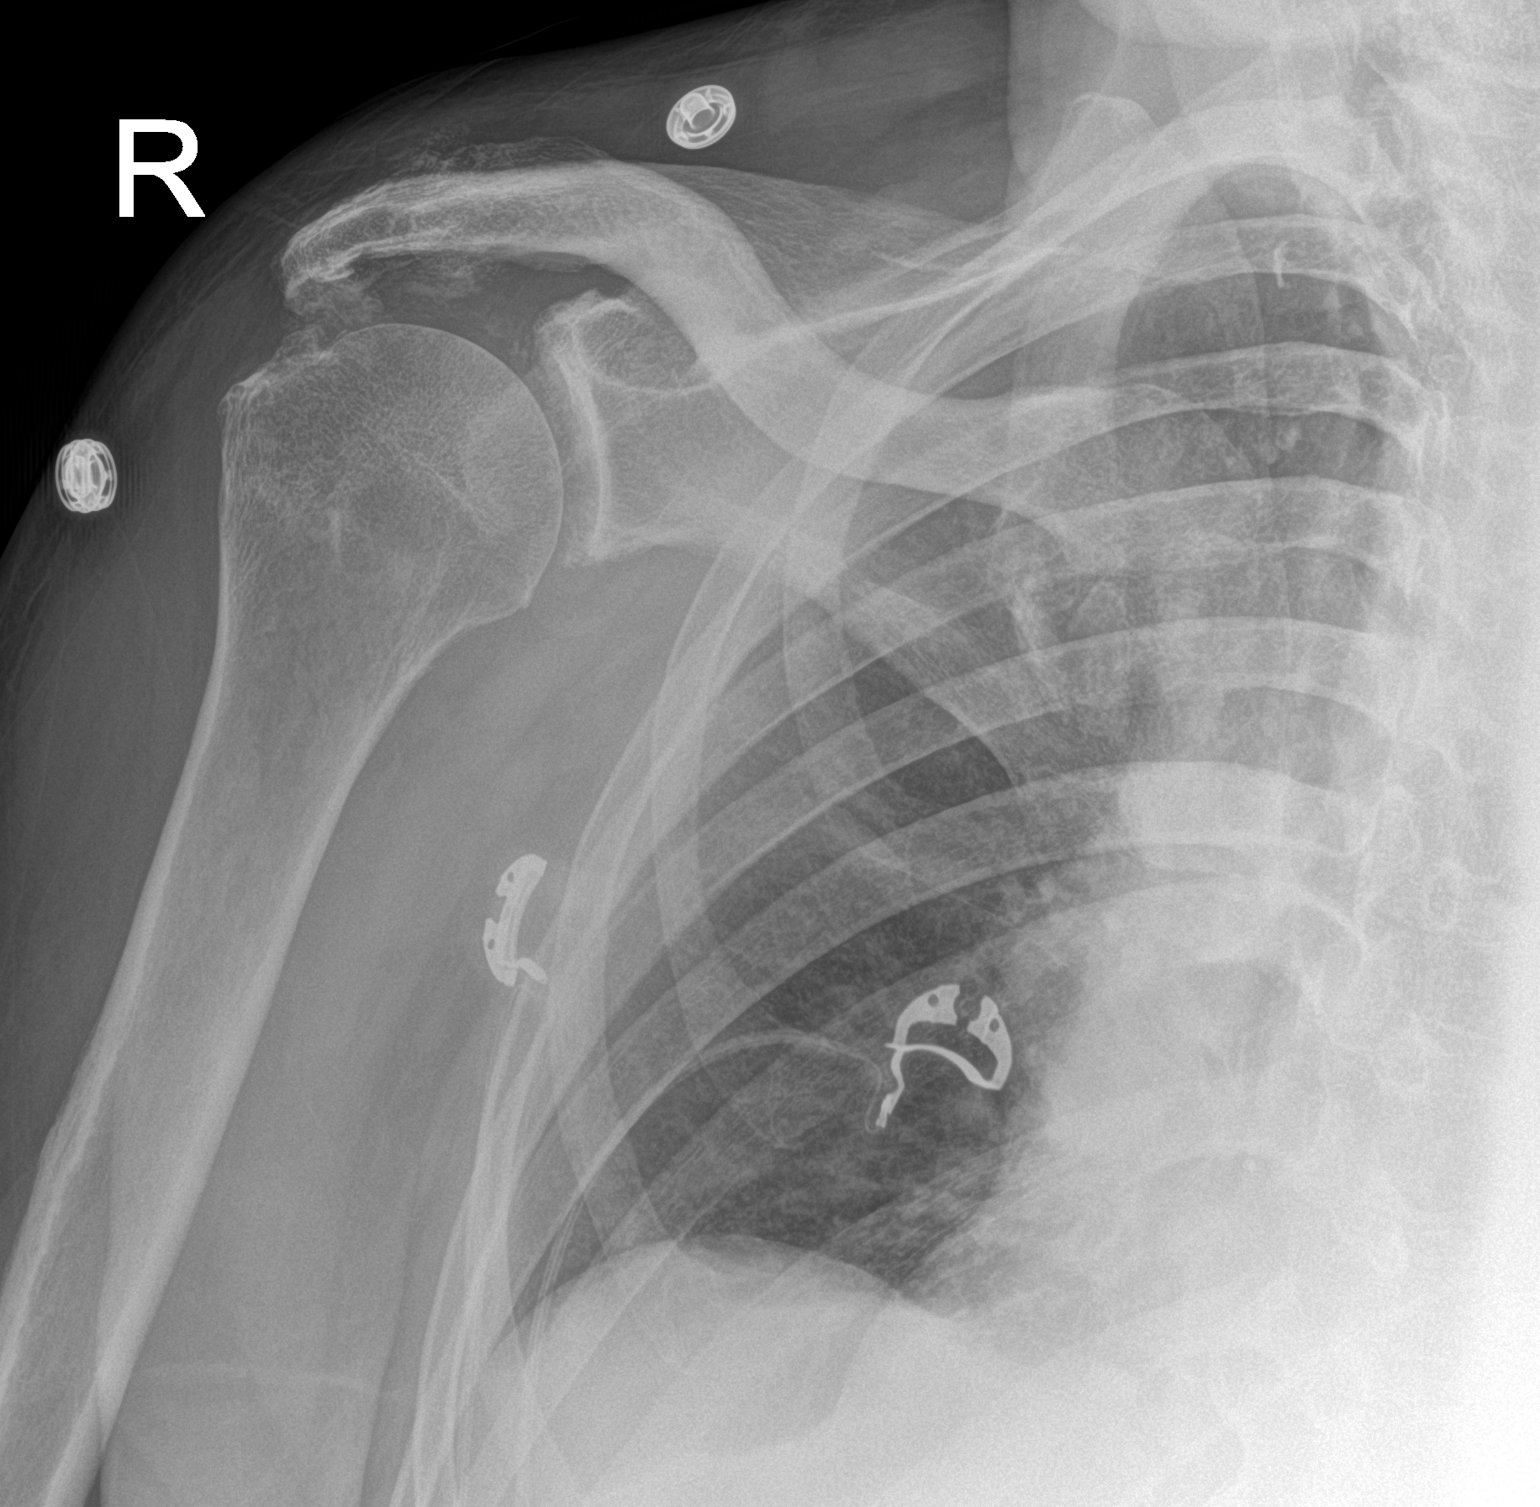

[2 of 2 positions shown; findings below may reference images not displayed]

FINDINGS: Osseous demineralization.

Degenerative changes at AC joint with inferior acromial and
clavicular spur formation.

No acute fracture, dislocation, or bone destruction.

Minimal chondrocalcinosis at shoulder joint.

Visualized ribs intact.
IMPRESSION: AC joint degenerative changes with inferior acromial and clavicular
spur formation which may predispose the patient to rotator cuff
pathology.

Chondrocalcinosis at shoulder joint question CPPD.

No acute abnormalities.

## 2019-11-27 ENCOUNTER — Other Ambulatory Visit: Payer: Self-pay

## 2019-11-27 IMAGING — MR MR HEAD W/O CM
10 of 11 series · 43 of 48 positions shown · non-contrast
Comparison: Two days ago

CLINICAL DATA: New onset right-sided weakness in the setting of
pulmonary embolism

EXAM:
MRI HEAD WITHOUT CONTRAST
TECHNIQUE: Multiplanar, multiecho pulse sequences of the brain and surrounding
structures were obtained without intravenous contrast.

[Series 5: DWI · axial · 3.0mm · 0.88mm/px · z∈[-67,+64]mm · 9 of 92 slices shown (1 of 4)]
[im 1/92]
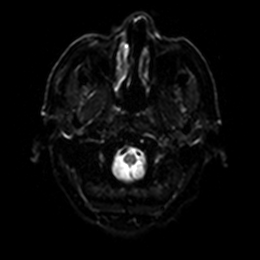
[im 12/92]
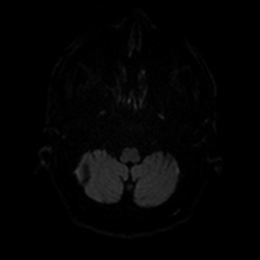
[im 23/92]
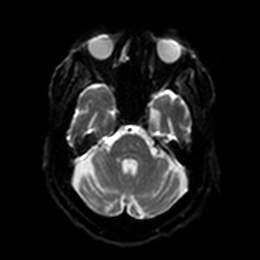
[im 35/92]
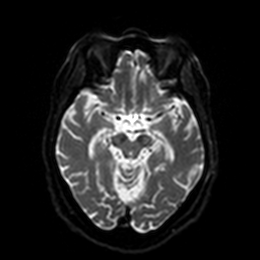
[im 46/92]
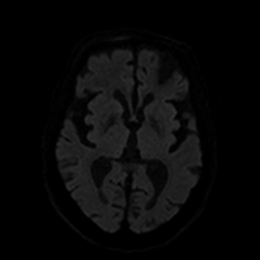
[im 57/92]
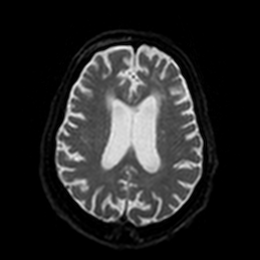
[im 69/92]
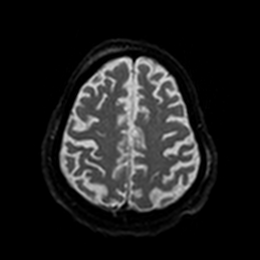
[im 80/92]
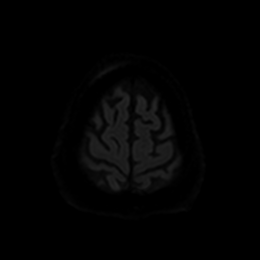
[im 92/92]
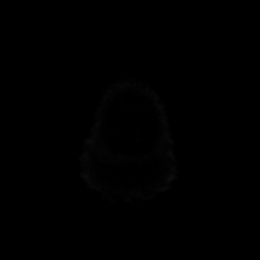

[Series 6: DWI · axial · 3.0mm · 0.88mm/px · z∈[-67,+64]mm · 5 of 46 slices shown (2 of 4)]
[im 1/46]
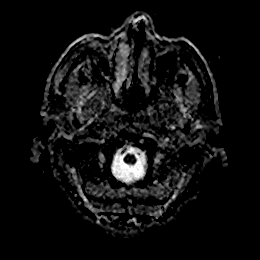
[im 12/46]
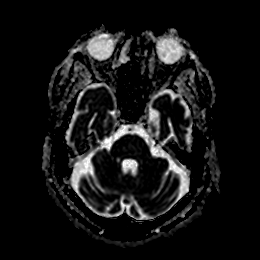
[im 23/46]
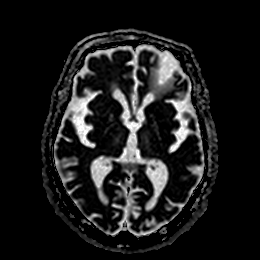
[im 34/46]
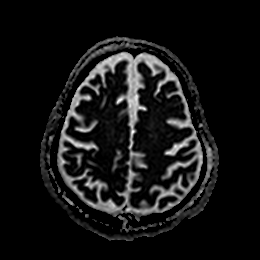
[im 46/46]
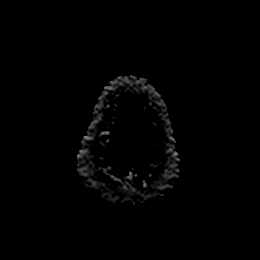

[Series 7: DWI · coronal · 4.0mm · 0.88mm/px · 7 of 72 slices shown (3 of 4)]
[im 1/72]
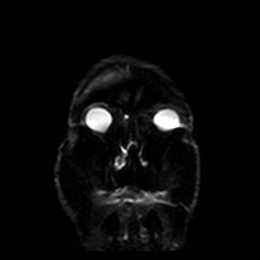
[im 12/72]
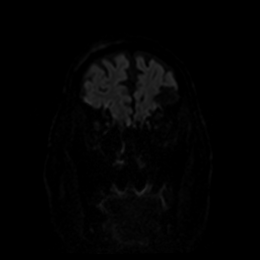
[im 24/72]
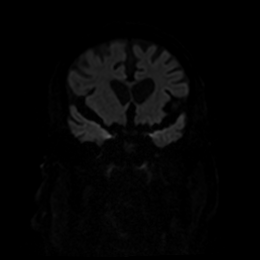
[im 36/72]
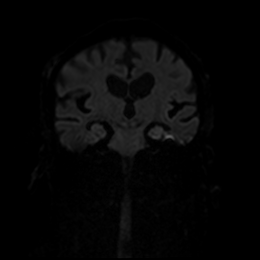
[im 48/72]
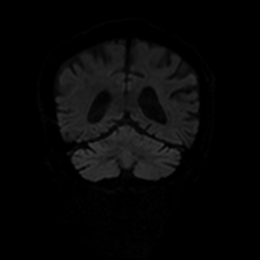
[im 60/72]
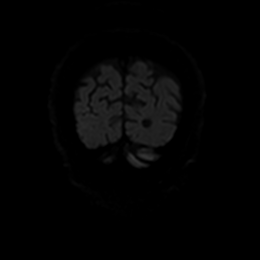
[im 72/72]
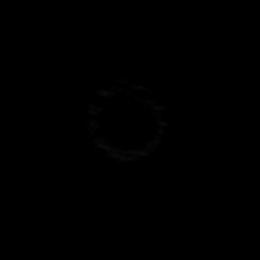

[Series 8: DWI · coronal · 4.0mm · 0.88mm/px · 3 of 36 slices shown (4 of 4)]
[im 1/36]
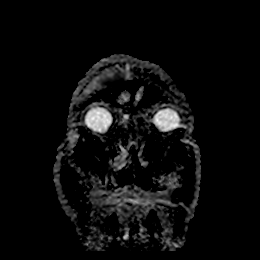
[im 18/36]
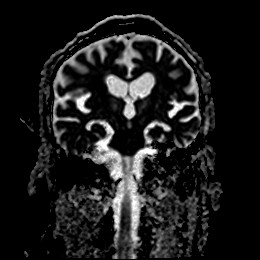
[im 36/36]
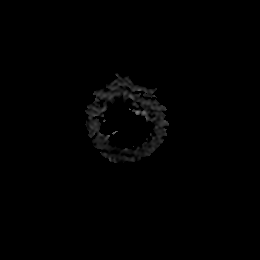

[Series 9: T1 · sagittal · 5.0mm · 0.75mm/px · 2 of 25 slices shown]
[im 1/25]
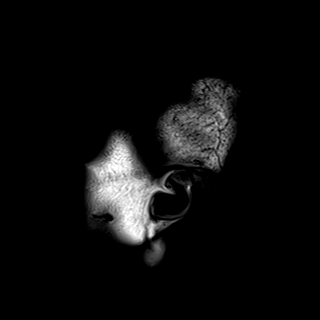
[im 25/25]
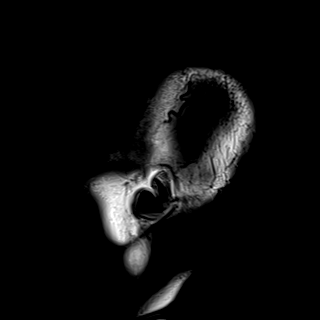

[Series 10: T2 · axial · 5.0mm · 0.72mm/px · z∈[-73,+67]mm · 2 of 25 slices shown (1 of 2)]
[im 1/25]
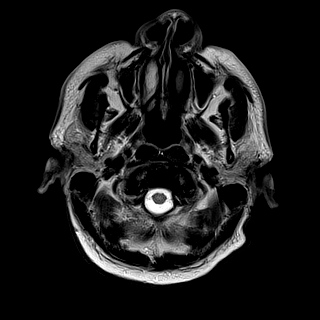
[im 25/25]
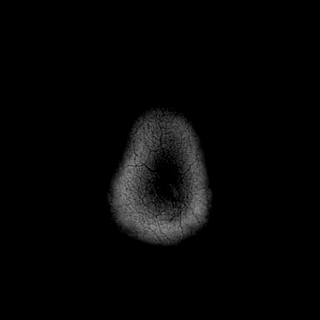

[Series 11: FLAIR · axial · 5.0mm · 0.45mm/px · z∈[-73,+67]mm · 2 of 25 slices shown]
[im 1/25]
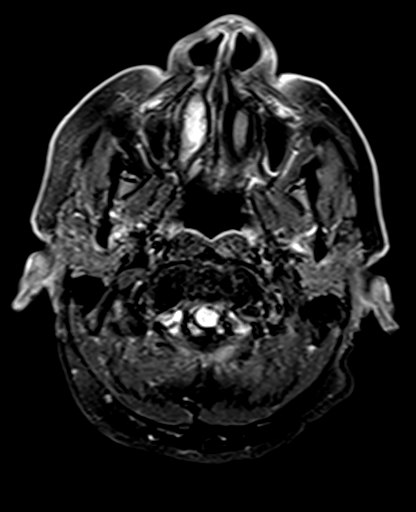
[im 25/25]
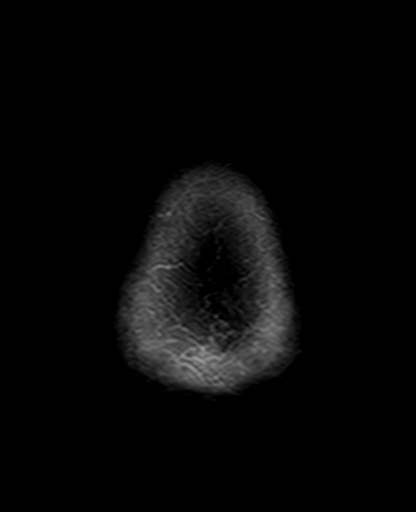

[Series 13: pha_images · axial · 3.0mm · 0.90mm/px · z∈[-86,+83]mm · 5 of 59 slices shown]
[im 1/59]
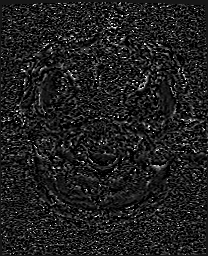
[im 15/59]
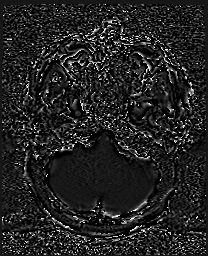
[im 30/59]
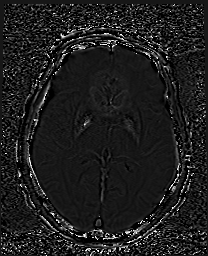
[im 44/59]
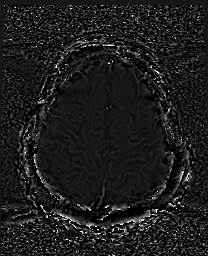
[im 59/59]
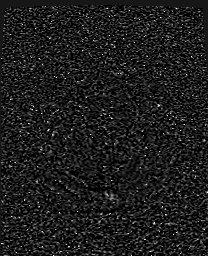

[Series 14: swi_images · axial · 3.0mm · 0.90mm/px · z∈[-89,+83]mm · 5 of 60 slices shown]
[im 1/60]
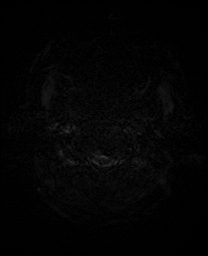
[im 15/60]
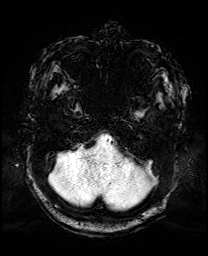
[im 30/60]
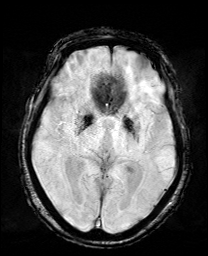
[im 45/60]
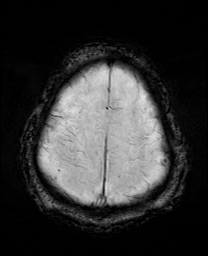
[im 60/60]
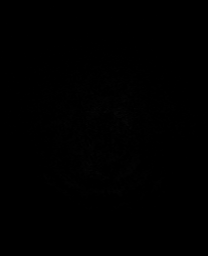

[Series 17: T2 · coronal · 5.0mm · 0.34mm/px · 3 of 29 slices shown (2 of 2)]
[im 1/29]
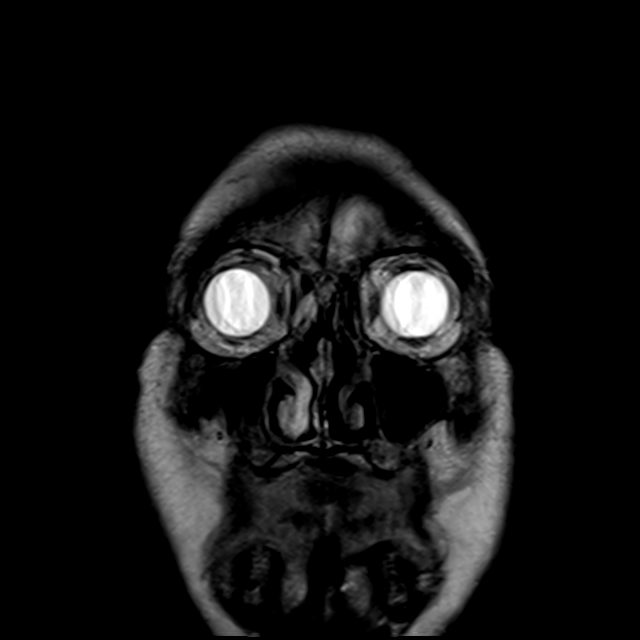
[im 15/29]
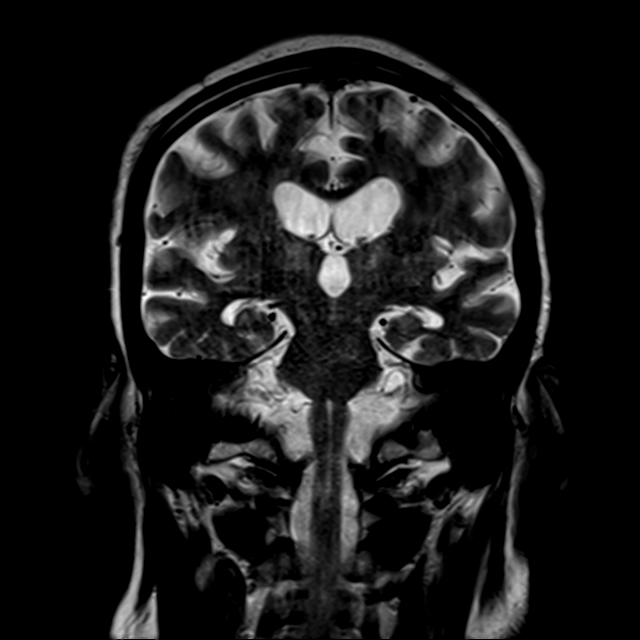
[im 29/29]
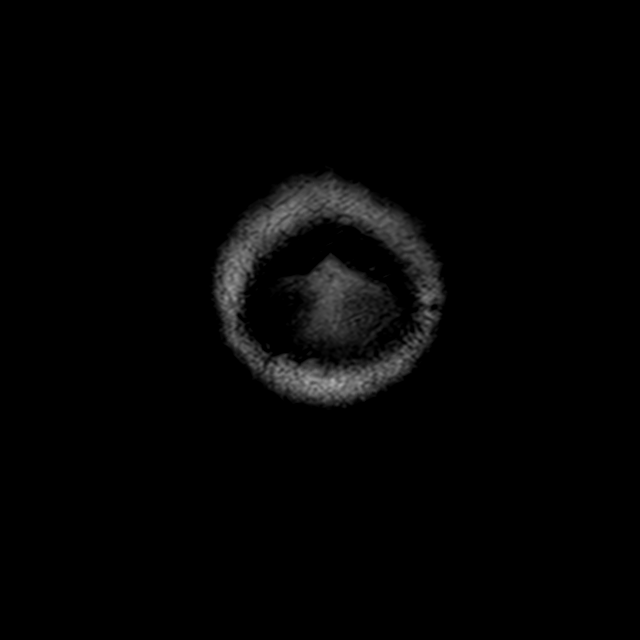

[43 of 48 positions shown; findings below may reference images not displayed]

FINDINGS: Brain: No acute infarction, hemorrhage, hydrocephalus, extra-axial
collection or mass lesion. Moderate area of encephalomalacia in the
anterior left frontal lobe, best attributed to chronic infarct.
Minimal small vessel ischemic change in the white matter and pons
for age. Cerebral volume loss without specific pattern, in keeping
with age.

Vascular: Normal flow voids

Skull and upper cervical spine: Mild right-sided scalp swelling.
Normal marrow signal

Sinuses/Orbits: Opacified right anterior ethmoid air cells with some
volume loss. Bilateral cataract resection
IMPRESSION: 1. No acute finding including infarct.
2. Aging brain with remote left frontal infarct.

## 2019-11-27 NOTE — Patient Outreach (Signed)
[facility]) Care Management  11/27/2019  [name] 05/14/1922 [id number]   Telephone call to daughter [name] to update on homebound COVID vaccine as she wanted to get her dad the vaccine but he is homebound.  She states he is doing ok.  Advised her that [employer] in [place] is giving the vaccine. CM gave her their phone number [phone].  She verbalized understanding and appreciative of the information.   Plan: RN CM will contact again in the month of June and daughter agreeable.   Jone Baseman, RN, MSN [place] [facility] Care Management Coordinator Direct Line [phone] Cell [phone] Toll Free: [phone]  Fax: [phone]

## 2019-11-29 DIAGNOSIS — L89154 Pressure ulcer of sacral region, stage 4: Secondary | ICD-10-CM | POA: Diagnosis not present

## 2019-11-29 DIAGNOSIS — I5032 Chronic diastolic (congestive) heart failure: Secondary | ICD-10-CM | POA: Diagnosis not present

## 2019-11-29 DIAGNOSIS — F0391 Unspecified dementia with behavioral disturbance: Secondary | ICD-10-CM | POA: Diagnosis not present

## 2019-11-29 DIAGNOSIS — I130 Hypertensive heart and chronic kidney disease with heart failure and stage 1 through stage 4 chronic kidney disease, or unspecified chronic kidney disease: Secondary | ICD-10-CM | POA: Diagnosis not present

## 2019-11-29 DIAGNOSIS — I2699 Other pulmonary embolism without acute cor pulmonale: Secondary | ICD-10-CM | POA: Diagnosis not present

## 2019-11-29 DIAGNOSIS — L97422 Non-pressure chronic ulcer of left heel and midfoot with fat layer exposed: Secondary | ICD-10-CM | POA: Diagnosis not present

## 2019-11-29 DIAGNOSIS — E11621 Type 2 diabetes mellitus with foot ulcer: Secondary | ICD-10-CM | POA: Diagnosis not present

## 2019-11-29 DIAGNOSIS — E1122 Type 2 diabetes mellitus with diabetic chronic kidney disease: Secondary | ICD-10-CM | POA: Diagnosis not present

## 2019-11-29 DIAGNOSIS — N183 Chronic kidney disease, stage 3 unspecified: Secondary | ICD-10-CM | POA: Diagnosis not present

## 2019-11-29 NOTE — Telephone Encounter (Signed)
Forwarded to [place], [place]

## 2019-11-29 NOTE — Telephone Encounter (Signed)
[name] with [facility] stated that the labs she drew last week was faxed to our office. Daughter is wondering the results. Please Advise.   (I have not seen the results, did they come to your desk?)

## 2019-11-29 NOTE — Telephone Encounter (Signed)
I did see them and wrote my responses on them and passed them back to CMA.

## 2019-11-30 ENCOUNTER — Encounter: Payer: Self-pay | Admitting: Internal Medicine

## 2019-11-30 ENCOUNTER — Other Ambulatory Visit (HOSPITAL_COMMUNITY)
Admission: RE | Admit: 2019-11-30 | Discharge: 2019-11-30 | Disposition: A | Discharge status: Home / Self Care | Payer: Medicare HMO | Source: Other Acute Inpatient Hospital | Attending: Internal Medicine | Admitting: Internal Medicine

## 2019-11-30 ENCOUNTER — Telehealth: Payer: Self-pay

## 2019-11-30 ENCOUNTER — Encounter (HOSPITAL_BASED_OUTPATIENT_CLINIC_OR_DEPARTMENT_OTHER): Payer: Medicare HMO | Admitting: Internal Medicine

## 2019-11-30 DIAGNOSIS — Z8673 Personal history of transient ischemic attack (TIA), and cerebral infarction without residual deficits: Secondary | ICD-10-CM | POA: Diagnosis not present

## 2019-11-30 DIAGNOSIS — L97422 Non-pressure chronic ulcer of left heel and midfoot with fat layer exposed: Secondary | ICD-10-CM | POA: Diagnosis not present

## 2019-11-30 DIAGNOSIS — R6889 Other general symptoms and signs: Secondary | ICD-10-CM | POA: Diagnosis not present

## 2019-11-30 DIAGNOSIS — I10 Essential (primary) hypertension: Secondary | ICD-10-CM | POA: Diagnosis not present

## 2019-11-30 DIAGNOSIS — E11621 Type 2 diabetes mellitus with foot ulcer: Secondary | ICD-10-CM | POA: Diagnosis not present

## 2019-11-30 DIAGNOSIS — L97522 Non-pressure chronic ulcer of other part of left foot with fat layer exposed: Secondary | ICD-10-CM | POA: Diagnosis not present

## 2019-11-30 DIAGNOSIS — L89154 Pressure ulcer of sacral region, stage 4: Secondary | ICD-10-CM | POA: Diagnosis not present

## 2019-11-30 DIAGNOSIS — E1169 Type 2 diabetes mellitus with other specified complication: Secondary | ICD-10-CM | POA: Diagnosis not present

## 2019-11-30 DIAGNOSIS — F0150 Vascular dementia without behavioral disturbance: Secondary | ICD-10-CM | POA: Diagnosis not present

## 2019-11-30 NOTE — Progress Notes (Signed)
JANDEL, ECKMAN ([id number]) Visit Report for 11/30/2019 Debridement Details Patient Name: Date of Service: [name] 11/30/2019 1:15 PM Medical Record Number: [id number] Patient Account Number: [id number] Date of Birth/Sex: Treating RN: [birth year] (86 y.o. Marvis Repress Primary Care Provider: Hollace Kinnier Other Clinician: Referring Provider: Treating Provider/Extender: Frederick Peers in Treatment: 6 Debridement Performed for Assessment: Wound #1 Sacrum Performed By: Physician Ricard Dillon., MD Debridement Type: Debridement Level of Consciousness (Pre-procedure): Awake and Alert Pre-procedure Verification/Time Out Yes - 14:15 Taken: Start Time: 14:15 Pain Control: Other : benzocaine, 20% T Area Debrided (L x W): otal 7.5 (cm) x 2.5 (cm) = 18.75 (cm) Tissue and other material debrided: Viable, Non-Viable, Eschar, Muscle, Slough, Subcutaneous, Slough Level: Skin/Subcutaneous Tissue/Muscle Debridement Description: Excisional Instrument: Curette Specimen: Swab, Number of Specimens T aken: 1 Bleeding: Moderate Hemostasis Achieved: Pressure End Time: 14:16 Procedural Pain: 3 Post Procedural Pain: 0 Response to Treatment: Procedure was tolerated well Level of Consciousness (Post- Awake and Alert procedure): Post Debridement Measurements of Total Wound Length: (cm) 7.5 Stage: Category/Stage IV Width: (cm) 2.5 Depth: (cm) 2.6 Volume: (cm) 38.288 Character of Wound/Ulcer Post Debridement: Requires Further Debridement Post Procedure Diagnosis Same as Pre-procedure Electronic Signature(s) Signed: 11/30/2019 5:51:03 PM By: Kela Millin Signed: 11/30/2019 6:02:27 PM By: Linton Ham MD Entered By: Linton Ham on 11/30/2019 14:37:18 -------------------------------------------------------------------------------- HPI Details Patient Name: Date of Service: [facility] [name], [name] 11/30/2019 1:15 PM Medical Record Number: [id number] Patient  Account Number: [id number] Date of Birth/Sex: Treating RN: 01-31-1922 (86 y.o. Marvis Repress Primary Care Provider: Hollace Kinnier Other Clinician: Referring Provider: Treating Provider/Extender: Frederick Peers in Treatment: 6 History of Present Illness HPI Description: 10/17/2019 upon evaluation today patient presents for initial inspection here in our clinic concerning issues that has been having with a wound in the sacral region as well as the left heel. Fortunately neither appears to be overtly and significantly infected there is some necrotic tissue noted at both locations however that is going to be required to be addressed. He does have a history of diabetes mellitus type 2, vascular dementia, and hypertension. The patient did have a stroke in November 2020 unfortunately he has not been the same since that time according to his daughter. Prior to that he was taking care of himself now he is 100% dependent. He is at home with his daughter who is present during the office visit today as well that is where I get the majority of the history from at this point. Patient does have a history of hypertension as well which does not appear to be too out of control today which is good news. He does have a hemoglobin A1c of 6.05 May 2019. They currently have been using a calcium alginate dressing to the sacrum and it sounds like Skin-Prep and a foam on the heel. 4/9; pressure ulcers on the sacral area and left heel. Been using Anasept wet-to-dry on the sacrum and Hydrofera Blue in the heel. He has home health family is helping with the dressing. It sounds as though they are fairly religious about offloading these areas that have pressure relief surfaces for the bed. Wounds look better 4/30; I have not seen this patient in 3 weeks. We have been using Anasept wet-to-dry the sacrum and Hydrofera Blue on the heel. When I saw this 3 weeks ago the sacrum actually look quite good  and I thought that we might be able to transition him to a collagen-based dressing today.  Electronic Signature(s) Signed: 11/30/2019 6:02:27 PM By: Linton Ham MD Entered By: Linton Ham on 11/30/2019 14:38:10 -------------------------------------------------------------------------------- Physical Exam Details Patient Name: Date of Service: [name] 11/30/2019 1:15 PM Medical Record Number: [id number] Patient Account Number: [id number] Date of Birth/Sex: Treating RN: 09-05-1921 (86 y.o. Marvis Repress Primary Care Provider: Hollace Kinnier Other Clinician: Referring Provider: Treating Provider/Extender: Frederick Peers in Treatment: 6 Constitutional Sitting or standing Blood Pressure is within target range for patient.. Pulse regular and within target range for patient.[name] Respirations regular, non-labored and within target range.. Temperature is normal and within the target range for the patient.[name] Appears in no distress. Notes Wound exam; the last time he was here he had healthy granulation. He has exposed bone. There is a lot of deteriorating tissue especially at the inferior part of this. Extensive debridement removing necrotic necrotic subcutaneous tissue and probably muscle. Very foul odor. Culture obtained. There is no extensive soft tissue involvement no crepitus no tenderness The area on his left heel actually looks quite good very superficial Electronic Signature(s) Signed: 11/30/2019 6:02:27 PM By: Linton Ham MD Entered By: Linton Ham on 11/30/2019 14:41:12 -------------------------------------------------------------------------------- Physician Orders Details Patient Name: Date of Service: [address] [name] 11/30/2019 1:15 PM Medical Record Number: [id number] Patient Account Number: [id number] Date of Birth/Sex: Treating RN: 1922/07/18 (86 y.o. Marvis Repress Primary Care Provider: Hollace Kinnier Other Clinician: Referring  Provider: Treating Provider/Extender: Frederick Peers in Treatment: 6 Verbal / Phone Orders: No Diagnosis Coding ICD-10 Coding Code Description L89.154 Pressure ulcer of sacral region, stage 4 E11.621 Type 2 diabetes mellitus with foot ulcer L97.522 Non-pressure chronic ulcer of other part of left foot with fat layer exposed F01.50 Vascular dementia without behavioral disturbance I10 Essential (primary) hypertension Follow-up Appointments Return Appointment in 1 week. Dressing Change Frequency Wound #1 Sacrum Change dressing every day. Wound #2 Left Calcaneus Change dressing three times week. Skin Barriers/Peri-Wound Care Wound #1 Sacrum Skin Prep - to periwound Wound Cleansing Wound #1 Sacrum Clean wound with Wound Cleanser Wound #2 Left Calcaneus Clean wound with Wound Cleanser Primary Wound Dressing Wound #1 Sacrum Calcium Alginate with Silver Wound #2 Left Calcaneus Hydrofera Blue - ready Secondary Dressing Wound #1 Sacrum ABD pad - secured with tegaderm to keep dressing from getting soiled Wound #2 Left Calcaneus Kerlix/Rolled Gauze Dry Gauze Heel Cup Off-Loading Turn and reposition every 2 hours Other: - float heels with pillows under calves while in bed [facility] skilled nursing for wound care. - [facility] naerobe culture (MICRO) - Wound Culture of Sacral - (ICD10 L89.154 - Pressure ulcer of sacral Bacteria identified in Unspecified specimen by A region, stage 4) LOINC Code: [id number] Convenience Name: Anerobic culture Comprehensive 2000 panel in Serum or Plasma (CHEM-panel) - (ICD10 L89.154 - Pressure ulcer of sacral region, stage 4) LOINC Code: [id number] Convenience Name: Comprehensive metabolic panel=CMS CBC W A Differential panel in Blood (HEM-CBC) - (ICD10 L89.154 - Pressure ulcer of sacral region, stage 4) uto LOINC Code: [id number] Convenience Name: CBC W Auto Differential panel Patient  Medications llergies: lisinopril A Notifications Medication Indication Start End Wound infection 11/30/2019 Augmentin DOSE oral 875 mg-125 mg tablet - 1 tablet oral bid for 7 days Electronic Signature(s) Signed: 11/30/2019 2:46:32 PM By: Linton Ham MD Entered By: Linton Ham on 11/30/2019 14:46:31 Prescription 11/30/2019 -------------------------------------------------------------------------------- Burna Sis MD Patient Name: Provider: [birth year] [id number] Date of Birth: NPI#: [id number] Sex: DEA #: [phone] [id number] Phone #: License #:  [place] Patient Address: [place] [address] [place], [place] [address] [place], [place] [address] [phone] Allergies Name Reaction Severity lisinopril cough Moderate Provider's Orders Comprehensive 2000 panel in Serum or Plasma - ICD10: L89.154 LOINC Code: [id number] Convenience Name: Comprehensive metabolic panel=CMS Hand Signature: Date(s): Prescription 11/30/2019 Burna Sis MD Patient Name: Provider: 1922/04/11 [id number] Date of Birth: NPI#: [name] [id number] Sex: DEA #: [phone] [id number] Phone #: License #: [place] Patient Address: [place] [address] [place], [place] [address] [place], [place] [address] [phone] Allergies Name Reaction Severity lisinopril cough Moderate Provider's Orders CBC W A Differential panel in Blood - ICD10: L89.154 uto LOINC Code: [id number] Convenience Name: CBC W Auto Differential panel Hand Signature: Date(s): Electronic Signature(s) Signed: 11/30/2019 6:02:27 PM By: Linton Ham MD Entered By: Linton Ham on 11/30/2019 14:46:33 -------------------------------------------------------------------------------- Problem List Details Patient Name: Date of Service: [address] [place], [place] 11/30/2019 1:15 PM Medical Record  Number: [id number] Patient Account Number: [id number] Date of Birth/Sex: Treating RN: 02-May-1922 (86 y.o. Marvis Repress Primary Care Provider: Hollace Kinnier Other Clinician: Referring Provider: Treating Provider/Extender: Frederick Peers in Treatment: 6 Active Problems ICD-10 Encounter Code Description Active Date MDM Diagnosis L89.154 Pressure ulcer of sacral region, stage 4 10/17/2019 No Yes E11.621 Type 2 diabetes mellitus with foot ulcer 10/17/2019 No Yes L97.522 Non-pressure chronic ulcer of other part of left foot with fat layer exposed 10/17/2019 No Yes F01.50 Vascular dementia without behavioral disturbance 10/17/2019 No Yes I10 Essential (primary) hypertension 10/17/2019 No Yes Inactive Problems Resolved Problems Electronic Signature(s) Signed: 11/30/2019 6:02:27 PM By: Linton Ham MD Entered By: Linton Ham on 11/30/2019 14:34:35 -------------------------------------------------------------------------------- Progress Note Details Patient Name: Date of Service: [address] [place], [place] 11/30/2019 1:15 PM Medical Record Number: [id number] Patient Account Number: [id number] Date of Birth/Sex: Treating RN: 28-Jan-1922 (86 y.o. Marvis Repress Primary Care Provider: Hollace Kinnier Other Clinician: Referring Provider: Treating Provider/Extender: Frederick Peers in Treatment: 6 Subjective History of Present Illness (HPI) 10/17/2019 upon evaluation today patient presents for initial inspection here in our clinic concerning issues that has been having with a wound in the sacral region as well as the left heel. Fortunately neither appears to be overtly and significantly infected there is some necrotic tissue noted at both locations however that is going to be required to be addressed. He does have a history of diabetes mellitus type 2, vascular dementia, and hypertension. The patient did have a stroke in November 2020 unfortunately he  has not been the same since that time according to his daughter. Prior to that he was taking care of himself now he is 100% dependent. He is at home with his daughter who is present during the office visit today as well that is where I get the majority of the history from at this point. Patient does have a history of hypertension as well which does not appear to be too out of control today which is good news. He does have a hemoglobin A1c of 6.05 May 2019. They currently have been using a calcium alginate dressing to the sacrum and it sounds like Skin-Prep and a foam on the heel. 4/9; pressure ulcers on the sacral area and left heel. Been using Anasept wet-to-dry on the sacrum and Hydrofera Blue in the heel. He has home health family is helping with the dressing. It sounds as though they  are fairly religious about offloading these areas that have pressure relief surfaces for the bed. Wounds look better 4/30; I have not seen this patient in 3 weeks. We have been using Anasept wet-to-dry the sacrum and Hydrofera Blue on the heel. When I saw this 3 weeks ago the sacrum actually look quite good and I thought that we might be able to transition him to a collagen-based dressing today. Objective Constitutional Sitting or standing Blood Pressure is within target range for patient.. Pulse regular and within target range for patient.[name] Respirations regular, non-labored and within target range.. Temperature is normal and within the target range for the patient.[name] Appears in no distress. Vitals Time Taken: 1:18 PM, Height: 71 in, Weight: 175 lbs, BMI: 24.4, Temperature: 98.4 F, Pulse: 102 bpm, Respiratory Rate: 18 breaths/min, Blood Pressure: 131/88 mmHg. General Notes: Wound exam; the last time he was here he had healthy granulation. He has exposed bone. There is a lot of deteriorating tissue especially at the inferior part of this. Extensive debridement removing necrotic necrotic subcutaneous tissue and  probably muscle. Very foul odor. Culture obtained. There is no extensive soft tissue involvement no crepitus no tenderness ooThe area on his left heel actually looks quite good very superficial Integumentary (Hair, Skin) Wound #1 status is Open. Original cause of wound was Pressure Injury. The wound is located on the Sacrum. The wound measures 7.5cm length x 2.5cm width x 2.6cm depth; 14.726cm^2 area and 38.288cm^3 volume. There is muscle and Fat Layer (Subcutaneous Tissue) Exposed exposed. There is no tunneling noted, however, there is undermining starting at 11:00 and ending at 5:00 with a maximum distance of 3.2cm. There is a medium amount of serosanguineous drainage noted. The wound margin is well defined and not attached to the wound base. There is large (67-100%) red granulation within the wound bed. There is a small (1-33%) amount of necrotic tissue within the wound bed including Adherent Slough. Wound #2 status is Open. Original cause of wound was Pressure Injury. The wound is located on the Left Calcaneus. The wound measures 0.3cm length x 0.3cm width x 0.1cm depth; 0.071cm^2 area and 0.007cm^3 volume. There is Fat Layer (Subcutaneous Tissue) Exposed exposed. There is a small amount of serosanguineous drainage noted. The wound margin is flat and intact. There is large (67-100%) red, pink granulation within the wound bed. There is a small (1- 33%) amount of necrotic tissue within the wound bed including Adherent Slough. Assessment Active Problems ICD-10 Pressure ulcer of sacral region, stage 4 Type 2 diabetes mellitus with foot ulcer Non-pressure chronic ulcer of other part of left foot with fat layer exposed Vascular dementia without behavioral disturbance Essential (primary) hypertension Procedures Wound #1 Pre-procedure diagnosis of Wound #1 is a Pressure Ulcer located on the Sacrum . There was a Excisional Skin/Subcutaneous Tissue/Muscle Debridement with a total area of 18.75 sq  cm performed by Ricard Dillon., MD. With the following instrument(s): Curette to remove Viable and Non-Viable tissue/material. Material removed includes Muscle, Eschar, Subcutaneous Tissue, and Slough after achieving pain control using Other (benzocaine, 20%). 1 specimen was taken by a Swab and sent to the lab per facility protocol. A time out was conducted at 14:15, prior to the start of the procedure. A Moderate amount of bleeding was controlled with Pressure. The procedure was tolerated well with a pain level of 3 throughout and a pain level of 0 following the procedure. Post Debridement Measurements: 7.5cm length x 2.5cm width x 2.6cm depth; 38.288cm^3 volume. Post debridement Stage noted  as Category/Stage IV. Character of Wound/Ulcer Post Debridement requires further debridement. Post procedure Diagnosis Wound #1: Same as Pre-Procedure Plan Follow-up Appointments: Return Appointment in 1 week. Dressing Change Frequency: Wound #1 Sacrum: Change dressing every day. Wound #2 Left Calcaneus: Change dressing three times week. Skin Barriers/Peri-Wound Care: Wound #1 Sacrum: Skin Prep - to periwound Wound Cleansing: Wound #1 Sacrum: Clean wound with Wound Cleanser Wound #2 Left Calcaneus: Clean wound with Wound Cleanser Primary Wound Dressing: Wound #1 Sacrum: Calcium Alginate with Silver Wound #2 Left Calcaneus: Hydrofera Blue - ready Secondary Dressing: Wound #1 Sacrum: ABD pad - secured with tegaderm to keep dressing from getting soiled Wound #2 Left Calcaneus: Kerlix/Rolled Gauze Dry Gauze Heel Cup Off-Loading: Turn and reposition every 2 hours Other: - float heels with pillows under calves while in bed Home Health: [facility] skilled nursing for wound care. - [facility] ordered were: Anerobic culture - Wound Culture of Sacral, Comprehensive metabolic panel=CMS, CBC W Auto Differential panel The following medication(s) was prescribed: Augmentin oral  875 mg-125 mg tablet 1 tablet oral bid for 7 days for Wound infection starting 11/30/2019 1. I have change the primary dressing to silver alginate dry gauze 2. Empiric augmentin 3. I will see him back next week in follow-up. 4. He has not been systemically unwell however his daughter states he is not drinking as well as usual. I have ordered a comprehensive metabolic panel and CBC with differential although they are not able to go down to the lab today because of transportation issues. We will see about getting this done at home next week. 5. I am hopeful that this will avoid a hospitalization but I have told her that if he has any signs of systemic illness including fever chills altered mental status etc. he will need to go to the ER 6 likely to need imaging Electronic Signature(s) Signed: 11/30/2019 6:02:27 PM By: Linton Ham MD Entered By: Linton Ham on 11/30/2019 14:47:30 -------------------------------------------------------------------------------- SuperBill Details Patient Name: Date of Service: [facility] [place], [place] 11/30/2019 Medical Record Number: [id number] Patient Account Number: [id number] Date of Birth/Sex: Treating RN: May 30, 1922 (86 y.o. Marvis Repress Primary Care Provider: Hollace Kinnier Other Clinician: Referring Provider: Treating Provider/Extender: Frederick Peers in Treatment: 6 Diagnosis Coding ICD-10 Codes Code Description [phone] Pressure ulcer of sacral region, stage 4 E11.621 Type 2 diabetes mellitus with foot ulcer L97.522 Non-pressure chronic ulcer of other part of left foot with fat layer exposed F01.50 Vascular dementia without behavioral disturbance I10 Essential (primary) hypertension Facility Procedures CPT4 Code: [id number] ICD- L89 Description: [id number] - DEB MUSC/FASCIA 20 SQ CM/< 10 Diagnosis Description .154 Pressure ulcer of sacral region, stage 4 Modifier: Quantity: 1 Physician Procedures : CPT4 Code Description  Modifier [id number] - WC PHYS DEBR MUSCLE/FASCIA 20 SQ CM ICD-10 Diagnosis Description L89.154 Pressure ulcer of sacral region, stage 4 Quantity: 1 Electronic Signature(s) Signed: 11/30/2019 6:02:27 PM By: Linton Ham MD Entered By: Linton Ham on 11/30/2019 14:47:43

## 2019-11-30 NOTE — Telephone Encounter (Signed)
I have the copy of his  Labs will reach out to daughter today [name]) with results.

## 2019-11-30 NOTE — Telephone Encounter (Signed)
Per Dr. Mariea Clonts Normacytic anemia * recommend he begin ferrous sulfate 325 mg,po daily  sugar average is good. I called to give result but no answer I did leave a voicemail for [facility] to call back

## 2019-12-03 DIAGNOSIS — I130 Hypertensive heart and chronic kidney disease with heart failure and stage 1 through stage 4 chronic kidney disease, or unspecified chronic kidney disease: Secondary | ICD-10-CM | POA: Diagnosis not present

## 2019-12-03 DIAGNOSIS — L97422 Non-pressure chronic ulcer of left heel and midfoot with fat layer exposed: Secondary | ICD-10-CM | POA: Diagnosis not present

## 2019-12-03 DIAGNOSIS — E1122 Type 2 diabetes mellitus with diabetic chronic kidney disease: Secondary | ICD-10-CM | POA: Diagnosis not present

## 2019-12-03 DIAGNOSIS — I5032 Chronic diastolic (congestive) heart failure: Secondary | ICD-10-CM | POA: Diagnosis not present

## 2019-12-03 DIAGNOSIS — N183 Chronic kidney disease, stage 3 unspecified: Secondary | ICD-10-CM | POA: Diagnosis not present

## 2019-12-03 DIAGNOSIS — L89154 Pressure ulcer of sacral region, stage 4: Secondary | ICD-10-CM | POA: Diagnosis not present

## 2019-12-03 DIAGNOSIS — E11621 Type 2 diabetes mellitus with foot ulcer: Secondary | ICD-10-CM | POA: Diagnosis not present

## 2019-12-03 DIAGNOSIS — F0391 Unspecified dementia with behavioral disturbance: Secondary | ICD-10-CM | POA: Diagnosis not present

## 2019-12-03 DIAGNOSIS — R2689 Other abnormalities of gait and mobility: Secondary | ICD-10-CM | POA: Diagnosis not present

## 2019-12-03 DIAGNOSIS — I2699 Other pulmonary embolism without acute cor pulmonale: Secondary | ICD-10-CM | POA: Diagnosis not present

## 2019-12-03 DIAGNOSIS — L89153 Pressure ulcer of sacral region, stage 3: Secondary | ICD-10-CM | POA: Diagnosis not present

## 2019-12-03 NOTE — Progress Notes (Signed)
[name], [name] ([id number]) Visit Report for 11/30/2019 Arrival Information Details Patient Name: Date of Service: [name], [name] 11/30/2019 1:15 PM Medical Record Number: [id number] Patient Account Number: [id number] Date of Birth/Sex: Treating RN: 1921-09-23 (86 y.o. [name]) Dolores Lory, Morey Hummingbird Primary Care Windie Marasco: Hollace Kinnier Other Clinician: Referring Kambrea Carrasco: Treating Freja Faro/Extender: Frederick Peers in Treatment: 6 Visit Information History Since Last Visit All ordered tests and consults were completed: No Patient Arrived: Wheel Chair Added or deleted any medications: No Arrival Time: 13:15 Any new allergies or adverse reactions: No Accompanied By: caregiver Had a fall or experienced change in No Transfer Assistance: None activities of daily living that may affect Patient Identification Verified: Yes risk of falls: Secondary Verification Process Completed: Yes Signs or symptoms of abuse/neglect since last visito No Patient Requires Transmission-Based Precautions: No Hospitalized since last visit: No Patient Has Alerts: Yes Implantable device outside of the clinic excluding No Patient Alerts: Patient on Blood Thinner cellular tissue based products placed in the center L ABI non compressible since last visit: Has Dressing in Place as Prescribed: Yes Pain Present Now: No Electronic Signature(s) Signed: 12/03/2019 5:02:18 PM By: Carlene Coria RN Entered By: Carlene Coria on 11/30/2019 13:18:27 -------------------------------------------------------------------------------- Encounter Discharge Information Details Patient Name: Date of Service: [address] [name], [name] 11/30/2019 1:15 PM Medical Record Number: [id number] Patient Account Number: [id number] Date of Birth/Sex: Treating RN: Dec 13, 1921 (86 y.o. Marvis Repress Primary Care Jahmez Bily: Hollace Kinnier Other Clinician: Referring Atwell Mcdanel: Treating Woodley Petzold/Extender: Frederick Peers in  Treatment: 6 Encounter Discharge Information Items Post Procedure Vitals Discharge Condition: Stable Temperature (F): 98.4 Ambulatory Status: Wheelchair Pulse (bpm): 102 Discharge Destination: Home Respiratory Rate (breaths/min): 18 Transportation: Private Auto Blood Pressure (mmHg): 131/88 Accompanied By: daughter Schedule Follow-up Appointment: Yes Clinical Summary of Care: Electronic Signature(s) Signed: 11/30/2019 5:38:02 PM By: Deon Pilling Entered By: Deon Pilling on 11/30/2019 15:51:08 -------------------------------------------------------------------------------- Multi Wound Chart Details Patient Name: Date of Service: [name], [name] 11/30/2019 1:15 PM Medical Record Number: [id number] Patient Account Number: [id number] Date of Birth/Sex: Treating RN: [birth year] (86 y.o. Marvis Repress Primary Care Jaeley Wiker: Hollace Kinnier Other Clinician: Referring Zurii Hewes: Treating Meribeth Vitug/Extender: Frederick Peers in Treatment: 6 Vital Signs Height(in): 36 Pulse(bpm): 102 Weight(lbs): 175 Blood Pressure(mmHg): 131/88 Body Mass Index(BMI): 24 Temperature(F): 98.4 Respiratory Rate(breaths/min): 18 Photos: [1:No Photos Sacrum] [2:No Photos Left Calcaneus] [N/A:N/A N/A] Wound Location: [1:Pressure Injury] [2:Pressure Injury] [N/A:N/A] Wounding Event: [1:Pressure Ulcer] [2:Diabetic Wound/Ulcer of the Lower] [N/A:N/A] Primary Etiology: [1:N/A] [2:Extremity Pressure Ulcer] [N/A:N/A] Secondary Etiology: [1:Glaucoma, Chronic Obstructive] [2:Glaucoma, Chronic Obstructive] [N/A:N/A] Comorbid History: [1:Pulmonary Disease (COPD), Coronary Pulmonary Disease (COPD), Coronary Artery Disease, Hypertension, Type II Artery Disease, Hypertension, Type II Diabetes, Osteoarthritis, Dementia 09/03/2019] [2:Diabetes, Osteoarthritis, Dementia  10/01/2019] [N/A:N/A] Date Acquired: [1:6] [2:6] [N/A:N/A] Weeks of Treatment: [1:Open] [2:Open] [N/A:N/A] Wound Status:  [1:7.5x2.5x2.6] [2:0.3x0.3x0.1] [N/A:N/A] Measurements L x W x D (cm) [1:14.726] [2:0.071] [N/A:N/A] A (cm) : rea [1:38.288] [2:0.007] [N/A:N/A] Volume (cm) : [1:-68.90%] [2:99.10%] [N/A:N/A] % Reduction in A [1:rea: -158.40%] [2:99.20%] [N/A:N/A] % Reduction in Volume: [1:11] Starting Position 1 (o'clock): [1:5] Ending Position 1 (o'clock): [1:3.2] Maximum Distance 1 (cm): [1:Yes] [2:N/A] [N/A:N/A] Undermining: [1:Category/Stage IV] [2:Grade 2] [N/A:N/A] Classification: [1:Medium] [2:Small] [N/A:N/A] Exudate A mount: [1:Serosanguineous] [2:Serosanguineous] [N/A:N/A] Exudate Type: [1:red, brown] [2:red, brown] [N/A:N/A] Exudate Color: [1:Well defined, not attached] [2:Flat and Intact] [N/A:N/A] Wound Margin: [1:Large (67-100%)] [2:Large (67-100%)] [N/A:N/A] Granulation A mount: [1:Red] [2:Red, Pink] [N/A:N/A] Granulation Quality: [1:Small (1-33%)] [2:Small (1-33%)] [N/A:N/A] Necrotic A mount: [1:Fat Layer (  Subcutaneous Tissue)] [2:Fat Layer (Subcutaneous Tissue)] [N/A:N/A] Exposed Structures: [1:Exposed: Yes Muscle: Yes Fascia: No Tendon: No Joint: No Bone: No Small (1-33%)] [2:Exposed: Yes Fascia: No Tendon: No Muscle: No Joint: No Bone: No Large (67-100%)] [N/A:N/A] Epithelialization: [1:Debridement - Excisional] [2:N/A] [N/A:N/A] Debridement: Pre-procedure Verification/Time Out 14:15 [2:N/A] [N/A:N/A] Taken: [1:Other] [2:N/A] [N/A:N/A] Pain Control: [1:Necrotic/Eschar, Subcutaneous,] [2:N/A] [N/A:N/A] Tissue Debrided: [1:Slough Skin/Subcutaneous Tissue] [2:N/A] [N/A:N/A] Level: [1:18.75] [2:N/A] [N/A:N/A] Debridement A (sq cm): [1:rea Curette] [2:N/A] [N/A:N/A] Instrument: [1:Swab] [2:N/A] [N/A:N/A] Specimen: [1:1] [2:N/A] [N/A:N/A] Number of Specimens Taken: [1:Moderate] [2:N/A] [N/A:N/A] Bleeding: [1:Pressure] [2:N/A] [N/A:N/A] Hemostasis Achieved: [1:3] [2:N/A] [N/A:N/A] Procedural Pain: [1:0] [2:N/A] [N/A:N/A] Post Procedural Pain: Debridement Treatment Response:  Procedure was tolerated well [2:N/A] [N/A:N/A] Post Debridement Measurements L x 7.5x2.5x2.6 [2:N/A] [N/A:N/A] W x D (cm) [1:38.288] [2:N/A] [N/A:N/A] Post Debridement Volume: (cm) [1:Category/Stage IV] [2:N/A] [N/A:N/A] Post Debridement Stage: [1:Debridement] [2:N/A] [N/A:N/A] Procedures Performed: Treatment Notes Electronic Signature(s) Signed: 11/30/2019 5:51:03 PM By: Kela Millin Signed: 11/30/2019 6:02:27 PM By: Linton Ham MD Entered By: Linton Ham on 11/30/2019 14:35:00 -------------------------------------------------------------------------------- Multi-Disciplinary Care Plan Details Patient Name: Date of Service: [facility] DARNAY, SCHWAMBERGER 11/30/2019 1:15 PM Medical Record Number: [id number] Patient Account Number: [id number] Date of Birth/Sex: Treating RN: [birth year] (86 y.o. Marvis Repress Primary Care Mireyah Chervenak: Hollace Kinnier Other Clinician: Referring Analysse Quinonez: Treating Shaquanta Harkless/Extender: Frederick Peers in Treatment: 6 Active Inactive Abuse / Safety / Falls / Self Care Management Nursing Diagnoses: Potential for falls Goals: Patient/caregiver will verbalize/demonstrate measures taken to prevent injury and/or falls Date Initiated: 10/17/2019 Target Resolution Date: 12/28/2019 Goal Status: Active Interventions: Assess fall risk on admission and as needed Assess: immobility, friction, shearing, incontinence upon admission and as needed Assess impairment of mobility on admission and as needed per policy Notes: Nutrition Nursing Diagnoses: Imbalanced nutrition Potential for alteratiion in Nutrition/Potential for imbalanced nutrition Goals: Patient/caregiver agrees to and verbalizes understanding of need to use nutritional supplements and/or vitamins as prescribed Date Initiated: 10/17/2019 Target Resolution Date: 12/28/2019 Goal Status: Active Interventions: Assess patient nutrition upon admission and as needed per policy Treatment  Activities: Patient referred to Primary Care Physician for further nutritional evaluation : 10/17/2019 Notes: Pressure Nursing Diagnoses: Knowledge deficit related to causes and risk factors for pressure ulcer development Knowledge deficit related to management of pressures ulcers Goals: Patient/caregiver will verbalize understanding of pressure ulcer management Date Initiated: 10/17/2019 Target Resolution Date: 12/28/2019 Goal Status: Active Interventions: Assess: immobility, friction, shearing, incontinence upon admission and as needed Assess offloading mechanisms upon admission and as needed Assess potential for pressure ulcer upon admission and as needed Provide education on pressure ulcers Notes: Wound/Skin Impairment Nursing Diagnoses: Impaired tissue integrity Knowledge deficit related to ulceration/compromised skin integrity Goals: Patient/caregiver will verbalize understanding of skin care regimen Date Initiated: 10/17/2019 Target Resolution Date: 12/28/2019 Goal Status: Active Ulcer/skin breakdown will have a volume reduction of 30% by week 4 Date Initiated: 10/17/2019 Target Resolution Date: 12/28/2019 Goal Status: Active Interventions: Assess patient/caregiver ability to obtain necessary supplies Assess patient/caregiver ability to perform ulcer/skin care regimen upon admission and as needed Assess ulceration(s) every visit Provide education on ulcer and skin care Treatment Activities: Skin care regimen initiated : 10/17/2019 Topical wound management initiated : 10/17/2019 Notes: Electronic Signature(s) Signed: 11/30/2019 5:51:03 PM By: Kela Millin Entered By: Kela Millin on 11/30/2019 14:45:08 -------------------------------------------------------------------------------- Pain Assessment Details Patient Name: Date of Service: [name], [name] 11/30/2019 1:15 PM Medical Record Number: [id number] Patient Account Number: [id number] Date of  Birth/Sex: Treating RN: [birth year] (86 y.o. [name]) Dolores Lory, [place]  Primary Care Camber Ninh: Hollace Kinnier Other Clinician: Referring Kannan Proia: Treating Chrys Landgrebe/Extender: Frederick Peers in Treatment: 6 Active Problems Location of Pain Severity and Description of Pain Patient Has Paino No Site Locations Pain Management and Medication Current Pain Management: Electronic Signature(s) Signed: 12/03/2019 5:02:18 PM By: Carlene Coria RN Entered By: Carlene Coria on 11/30/2019 13:40:48 -------------------------------------------------------------------------------- Patient/Caregiver Education Details Patient Name: Date of Service: [facility] 4/30/2021andnbsp1:15 PM Medical Record Number: [id number] Patient Account Number: [id number] Date of Birth/Gender: Treating RN: 09-23-1921 (86 y.o. Marvis Repress Primary Care Physician: Hollace Kinnier Other Clinician: Referring Physician: Treating Physician/Extender: Frederick Peers in Treatment: 6 Education Assessment Education Provided To: Caregiver Education Topics Provided Pressure: Handouts: Pressure Ulcers: Care and Offloading Methods: Explain/Verbal Responses: State content correctly Wound/Skin Impairment: Handouts: Caring for Your Ulcer Methods: Explain/Verbal Responses: State content correctly Electronic Signature(s) Signed: 11/30/2019 5:51:03 PM By: Kela Millin Entered By: Kela Millin on 11/30/2019 14:45:26 -------------------------------------------------------------------------------- Wound Assessment Details Patient Name: Date of Service: [name], [name] 11/30/2019 1:15 PM Medical Record Number: [id number] Patient Account Number: [id number] Date of Birth/Sex: Treating RN: Dec 24, 1921 (86 y.o. [name]) Carlene Coria Primary Care Remijio Holleran: Hollace Kinnier Other Clinician: Referring Lukas Pelcher: Treating Drusilla Wampole/Extender: Frederick Peers in Treatment: 6 Wound  Status Wound Number: 1 Primary Pressure Ulcer Etiology: Wound Location: Sacrum Wound Open Wounding Event: Pressure Injury Status: Date Acquired: 09/03/2019 Comorbid Glaucoma, Chronic Obstructive Pulmonary Disease (COPD), Weeks Of Treatment: 6 History: Coronary Artery Disease, Hypertension, Type II Diabetes, Clustered Wound: No Osteoarthritis, Dementia Wound Measurements Length: (cm) 7.5 Width: (cm) 2.5 Depth: (cm) 2.6 Area: (cm) 14.726 Volume: (cm) 38.288 % Reduction in Area: -68.9% % Reduction in Volume: -158.4% Epithelialization: Small (1-33%) Tunneling: No Undermining: Yes Starting Position (o'clock): 11 Ending Position (o'clock): 5 Maximum Distance: (cm) 3.2 Wound Description Classification: Category/Stage IV Wound Margin: Well defined, not attached Exudate Amount: Medium Exudate Type: Serosanguineous Exudate Color: red, brown Foul Odor After Cleansing: No Slough/Fibrino Yes Wound Bed Granulation Amount: Large (67-100%) Exposed Structure Granulation Quality: Red Fascia Exposed: No Necrotic Amount: Small (1-33%) Fat Layer (Subcutaneous Tissue) Exposed: Yes Necrotic Quality: Adherent Slough Tendon Exposed: No Muscle Exposed: Yes Necrosis of Muscle: No Joint Exposed: No Bone Exposed: No Treatment Notes Wound #1 (Sacrum) 1. Cleanse With Wound Cleanser 2. Periwound Care Skin Prep 3. Primary Dressing Applied Calcium Alginate Ag 4. Secondary Dressing ABD Pad 5. Secured With Medipore tape Other (specify in notes) Notes tegaderm used to secure abd pad down. Electronic Signature(s) Signed: 12/03/2019 5:02:18 PM By: Carlene Coria RN Entered By: Carlene Coria on 11/30/2019 13:42:09 -------------------------------------------------------------------------------- Wound Assessment Details Patient Name: Date of Service: [name], [name] 11/30/2019 1:15 PM Medical Record Number: [id number] Patient Account Number: [id number] Date of Birth/Sex: Treating RN: May 29, 1922  (86 y.o. [name]) Carlene Coria Primary Care Dayra Rapley: Hollace Kinnier Other Clinician: Referring Cristela Stalder: Treating Alga Southall/Extender: Frederick Peers in Treatment: 6 Wound Status Wound Number: 2 Primary Diabetic Wound/Ulcer of the Lower Extremity Etiology: Wound Location: Left Calcaneus Secondary Pressure Ulcer Wounding Event: Pressure Injury Etiology: Date Acquired: 10/01/2019 Wound Open Weeks Of Treatment: 6 Status: Clustered Wound: No Comorbid Glaucoma, Chronic Obstructive Pulmonary Disease (COPD), History: Coronary Artery Disease, Hypertension, Type II Diabetes, Osteoarthritis, Dementia Photos Photo Uploaded By: Mikeal Hawthorne on 12/03/2019 15:31:15 Wound Measurements Length: (cm) 0.3 Width: (cm) 0.3 Depth: (cm) 0.1 Area: (cm) 0.071 Volume: (cm) 0.007 % Reduction in Area: 99.1% % Reduction in Volume: 99.2% Epithelialization: Large (67-100%) Wound Description Classification: Grade 2 Wound Margin: Flat  and Intact Exudate Amount: Small Exudate Type: Serosanguineous Exudate Color: red, brown Foul Odor After Cleansing: No Slough/Fibrino No Wound Bed Granulation Amount: Large (67-100%) Exposed Structure Granulation Quality: Red, Pink Fascia Exposed: No Necrotic Amount: Small (1-33%) Fat Layer (Subcutaneous Tissue) Exposed: Yes Necrotic Quality: Adherent Slough Tendon Exposed: No Muscle Exposed: No Joint Exposed: No Bone Exposed: No Treatment Notes Wound #2 (Left Calcaneus) 1. Cleanse With Wound Cleanser 3. Primary Dressing Applied Hydrofera Blue 4. Secondary Dressing Dry Gauze Roll Gauze Heel Cup 5. Secured With [employer]) Signed: 12/03/2019 5:02:18 PM By: Carlene Coria RN Entered By: Carlene Coria on 11/30/2019 13:42:16 -------------------------------------------------------------------------------- Vitals Details Patient Name: Date of Service: [name], [name] 11/30/2019 1:15 PM Medical Record Number:  [id number] Patient Account Number: [id number] Date of Birth/Sex: Treating RN: 06/11/1922 (86 y.o. [name]) Carlene Coria Primary Care Teriana Danker: Hollace Kinnier Other Clinician: Referring Elimelech Houseman: Treating Nellene Courtois/Extender: Frederick Peers in Treatment: 6 Vital Signs Time Taken: 13:18 Temperature (F): 98.4 Height (in): 71 Pulse (bpm): 102 Weight (lbs): 175 Respiratory Rate (breaths/min): 18 Body Mass Index (BMI): 24.4 Blood Pressure (mmHg): 131/88 Reference Range: 80 - 120 mg / dl Electronic Signature(s) Signed: 12/03/2019 5:02:18 PM By: Carlene Coria RN Entered By: Carlene Coria on 11/30/2019 13:18:58

## 2019-12-05 ENCOUNTER — Inpatient Hospital Stay (HOSPITAL_COMMUNITY)
Admission: EM | Admit: 2019-12-05 | Discharge: 2019-12-07 | DRG: 593 | Disposition: A | Discharge status: Home Health | Payer: Medicare HMO | Attending: Internal Medicine | Admitting: Internal Medicine

## 2019-12-05 ENCOUNTER — Emergency Department (HOSPITAL_COMMUNITY): Payer: Medicare HMO

## 2019-12-05 DIAGNOSIS — R410 Disorientation, unspecified: Secondary | ICD-10-CM | POA: Diagnosis not present

## 2019-12-05 DIAGNOSIS — L89154 Pressure ulcer of sacral region, stage 4: Secondary | ICD-10-CM | POA: Diagnosis not present

## 2019-12-05 DIAGNOSIS — J432 Centrilobular emphysema: Secondary | ICD-10-CM | POA: Diagnosis present

## 2019-12-05 DIAGNOSIS — H409 Unspecified glaucoma: Secondary | ICD-10-CM | POA: Diagnosis present

## 2019-12-05 DIAGNOSIS — Z8616 Personal history of COVID-19: Secondary | ICD-10-CM

## 2019-12-05 DIAGNOSIS — M1990 Unspecified osteoarthritis, unspecified site: Secondary | ICD-10-CM | POA: Diagnosis present

## 2019-12-05 DIAGNOSIS — Z7401 Bed confinement status: Secondary | ICD-10-CM

## 2019-12-05 DIAGNOSIS — B9620 Unspecified Escherichia coli [E. coli] as the cause of diseases classified elsewhere: Secondary | ICD-10-CM | POA: Diagnosis present

## 2019-12-05 DIAGNOSIS — D631 Anemia in chronic kidney disease: Secondary | ICD-10-CM | POA: Diagnosis present

## 2019-12-05 DIAGNOSIS — Z825 Family history of asthma and other chronic lower respiratory diseases: Secondary | ICD-10-CM

## 2019-12-05 DIAGNOSIS — Z7901 Long term (current) use of anticoagulants: Secondary | ICD-10-CM

## 2019-12-05 DIAGNOSIS — F0151 Vascular dementia with behavioral disturbance: Secondary | ICD-10-CM | POA: Diagnosis not present

## 2019-12-05 DIAGNOSIS — Z7189 Other specified counseling: Secondary | ICD-10-CM | POA: Diagnosis not present

## 2019-12-05 DIAGNOSIS — H9190 Unspecified hearing loss, unspecified ear: Secondary | ICD-10-CM | POA: Diagnosis present

## 2019-12-05 DIAGNOSIS — Z87891 Personal history of nicotine dependence: Secondary | ICD-10-CM

## 2019-12-05 DIAGNOSIS — Z803 Family history of malignant neoplasm of breast: Secondary | ICD-10-CM

## 2019-12-05 DIAGNOSIS — M2550 Pain in unspecified joint: Secondary | ICD-10-CM | POA: Diagnosis not present

## 2019-12-05 DIAGNOSIS — N183 Chronic kidney disease, stage 3 unspecified: Secondary | ICD-10-CM | POA: Diagnosis present

## 2019-12-05 DIAGNOSIS — T148XXA Other injury of unspecified body region, initial encounter: Secondary | ICD-10-CM | POA: Insufficient documentation

## 2019-12-05 DIAGNOSIS — Z888 Allergy status to other drugs, medicaments and biological substances status: Secondary | ICD-10-CM

## 2019-12-05 DIAGNOSIS — Z823 Family history of stroke: Secondary | ICD-10-CM | POA: Diagnosis not present

## 2019-12-05 DIAGNOSIS — Z20822 Contact with and (suspected) exposure to covid-19: Secondary | ICD-10-CM | POA: Diagnosis not present

## 2019-12-05 DIAGNOSIS — I110 Hypertensive heart disease with heart failure: Secondary | ICD-10-CM | POA: Diagnosis present

## 2019-12-05 DIAGNOSIS — I130 Hypertensive heart and chronic kidney disease with heart failure and stage 1 through stage 4 chronic kidney disease, or unspecified chronic kidney disease: Secondary | ICD-10-CM | POA: Diagnosis not present

## 2019-12-05 DIAGNOSIS — Z8261 Family history of arthritis: Secondary | ICD-10-CM | POA: Diagnosis not present

## 2019-12-05 DIAGNOSIS — I5032 Chronic diastolic (congestive) heart failure: Secondary | ICD-10-CM | POA: Diagnosis present

## 2019-12-05 DIAGNOSIS — E785 Hyperlipidemia, unspecified: Secondary | ICD-10-CM | POA: Diagnosis present

## 2019-12-05 DIAGNOSIS — I2510 Atherosclerotic heart disease of native coronary artery without angina pectoris: Secondary | ICD-10-CM | POA: Diagnosis not present

## 2019-12-05 DIAGNOSIS — M545 Low back pain: Secondary | ICD-10-CM | POA: Diagnosis not present

## 2019-12-05 DIAGNOSIS — R5381 Other malaise: Secondary | ICD-10-CM | POA: Diagnosis not present

## 2019-12-05 DIAGNOSIS — L089 Local infection of the skin and subcutaneous tissue, unspecified: Secondary | ICD-10-CM | POA: Diagnosis not present

## 2019-12-05 DIAGNOSIS — Z833 Family history of diabetes mellitus: Secondary | ICD-10-CM | POA: Diagnosis not present

## 2019-12-05 DIAGNOSIS — M869 Osteomyelitis, unspecified: Secondary | ICD-10-CM | POA: Diagnosis present

## 2019-12-05 DIAGNOSIS — M5489 Other dorsalgia: Secondary | ICD-10-CM | POA: Diagnosis not present

## 2019-12-05 DIAGNOSIS — Z7982 Long term (current) use of aspirin: Secondary | ICD-10-CM

## 2019-12-05 DIAGNOSIS — I482 Chronic atrial fibrillation, unspecified: Secondary | ICD-10-CM | POA: Diagnosis not present

## 2019-12-05 DIAGNOSIS — R0902 Hypoxemia: Secondary | ICD-10-CM | POA: Diagnosis not present

## 2019-12-05 DIAGNOSIS — F0390 Unspecified dementia without behavioral disturbance: Secondary | ICD-10-CM

## 2019-12-05 DIAGNOSIS — Z86711 Personal history of pulmonary embolism: Secondary | ICD-10-CM

## 2019-12-05 DIAGNOSIS — E1122 Type 2 diabetes mellitus with diabetic chronic kidney disease: Secondary | ICD-10-CM | POA: Diagnosis present

## 2019-12-05 DIAGNOSIS — I69359 Hemiplegia and hemiparesis following cerebral infarction affecting unspecified side: Secondary | ICD-10-CM | POA: Diagnosis not present

## 2019-12-05 DIAGNOSIS — D649 Anemia, unspecified: Secondary | ICD-10-CM

## 2019-12-05 DIAGNOSIS — Z794 Long term (current) use of insulin: Secondary | ICD-10-CM

## 2019-12-05 DIAGNOSIS — Z515 Encounter for palliative care: Secondary | ICD-10-CM

## 2019-12-05 DIAGNOSIS — Z03818 Encounter for observation for suspected exposure to other biological agents ruled out: Secondary | ICD-10-CM | POA: Diagnosis not present

## 2019-12-05 DIAGNOSIS — F03C0 Unspecified dementia, severe, without behavioral disturbance, psychotic disturbance, mood disturbance, and anxiety: Secondary | ICD-10-CM

## 2019-12-05 DIAGNOSIS — I469 Cardiac arrest, cause unspecified: Secondary | ICD-10-CM | POA: Diagnosis not present

## 2019-12-05 DIAGNOSIS — R52 Pain, unspecified: Secondary | ICD-10-CM | POA: Diagnosis not present

## 2019-12-05 LAB — IRON AND TIBC
Iron: 19 ug/dL — ABNORMAL LOW (ref 45–182)
Saturation Ratios: 10 % — ABNORMAL LOW (ref 17.9–39.5)
TIBC: 199 ug/dL — ABNORMAL LOW (ref 250–450)
UIBC: 180 ug/dL

## 2019-12-05 LAB — COMPREHENSIVE METABOLIC PANEL
ALT: 28 U/L (ref 0–44)
AST: 29 U/L (ref 15–41)
Albumin: 2.4 g/dL — ABNORMAL LOW (ref 3.5–5.0)
Alkaline Phosphatase: 66 U/L (ref 38–126)
Anion gap: 7 (ref 5–15)
BUN: 32 mg/dL — ABNORMAL HIGH (ref 8–23)
CO2: 26 mmol/L (ref 22–32)
Calcium: 9.1 mg/dL (ref 8.9–10.3)
Chloride: 101 mmol/L (ref 98–111)
Creatinine, Ser: 1.03 mg/dL (ref 0.61–1.24)
GFR calc Af Amer: >60 mL/min (ref >60)
GFR calc non Af Amer: >60 mL/min (ref >60)
Glucose, Bld: 180 mg/dL — ABNORMAL HIGH (ref 70–99)
Potassium: 4.8 mmol/L (ref 3.5–5.1)
Sodium: 134 mmol/L — ABNORMAL LOW (ref 135–145)
Total Bilirubin: 0.2 mg/dL — ABNORMAL LOW (ref 0.3–1.2)
Total Protein: 6.2 g/dL — ABNORMAL LOW (ref 6.5–8.1)

## 2019-12-05 LAB — CBC WITH DIFFERENTIAL/PLATELET
Abs Immature Granulocytes: 0.11 10*3/uL — ABNORMAL HIGH (ref 0.00–0.07)
Basophils Absolute: 0 10*3/uL (ref 0.0–0.1)
Basophils Relative: 0 %
Eosinophils Absolute: 0.3 10*3/uL (ref 0.0–0.5)
Eosinophils Relative: 2 %
HCT: 22.8 % — ABNORMAL LOW (ref 39.0–52.0)
Hemoglobin: 7.3 g/dL — ABNORMAL LOW (ref 13.0–17.0)
Immature Granulocytes: 1 %
Lymphocytes Relative: 25 %
Lymphs Abs: 2.7 10*3/uL (ref 0.7–4.0)
MCH: 29.7 pg (ref 26.0–34.0)
MCHC: 32 g/dL (ref 30.0–36.0)
MCV: 92.7 fL (ref 80.0–100.0)
Monocytes Absolute: 1.1 10*3/uL — ABNORMAL HIGH (ref 0.1–1.0)
Monocytes Relative: 11 %
Neutro Abs: 6.6 10*3/uL (ref 1.7–7.7)
Neutrophils Relative %: 61 %
Platelets: 493 10*3/uL — ABNORMAL HIGH (ref 150–400)
RBC: 2.46 MIL/uL — ABNORMAL LOW (ref 4.22–5.81)
RDW: 13.4 % (ref 11.5–15.5)
WBC: 10.8 10*3/uL — ABNORMAL HIGH (ref 4.0–10.5)
nRBC: 0 % (ref 0.0–0.2)

## 2019-12-05 LAB — C-REACTIVE PROTEIN: CRP: 15.3 mg/dL — ABNORMAL HIGH (ref <1.0)

## 2019-12-05 LAB — RETICULOCYTES
Immature Retic Fract: 20.1 % — ABNORMAL HIGH (ref 2.3–15.9)
RBC.: 2.6 MIL/uL — ABNORMAL LOW (ref 4.22–5.81)
Retic Count, Absolute: 37.7 10*3/uL (ref 19.0–186.0)
Retic Ct Pct: 1.5 % (ref 0.4–3.1)

## 2019-12-05 LAB — SEDIMENTATION RATE: Sed Rate: 137 mm/hr — ABNORMAL HIGH (ref 0–16)

## 2019-12-05 LAB — CBG MONITORING, ED
Glucose-Capillary: 166 mg/dL — ABNORMAL HIGH (ref 70–99)
Glucose-Capillary: 211 mg/dL — ABNORMAL HIGH (ref 70–99)

## 2019-12-05 LAB — FERRITIN: Ferritin: 1668 ng/mL — ABNORMAL HIGH (ref 24–336)

## 2019-12-05 MED ORDER — INSULIN ASPART 100 UNIT/ML ~~LOC~~ SOLN
0.0000 [IU] | Freq: Three times a day (TID) | SUBCUTANEOUS | Status: DC
  Administered 2019-12-05: 19:00:00 2 [IU] via SUBCUTANEOUS
  Administered 2019-12-06: 11:00:00 1 [IU] via SUBCUTANEOUS
  Administered 2019-12-07: 2 [IU] via SUBCUTANEOUS
  Administered 2019-12-07 (×2): 5 [IU] via SUBCUTANEOUS

## 2019-12-05 MED ORDER — ESCITALOPRAM OXALATE 10 MG PO TABS
5.0000 mg | ORAL_TABLET | Freq: Every day | ORAL | Status: DC
  Administered 2019-12-05 – 2019-12-07 (×3): 5 mg via ORAL
  Filled 2019-12-05 (×3): qty 1

## 2019-12-05 MED ORDER — VANCOMYCIN HCL 1250 MG/250ML IV SOLN: 1250.0000 mg | INTRAVENOUS | Status: DC

## 2019-12-05 MED ORDER — LOSARTAN POTASSIUM 50 MG PO TABS
50.0000 mg | ORAL_TABLET | Freq: Every day | ORAL | Status: DC
  Administered 2019-12-05 – 2019-12-06 (×2): 50 mg via ORAL
  Filled 2019-12-05 (×2): qty 1

## 2019-12-05 MED ORDER — HYDROCHLOROTHIAZIDE 25 MG PO TABS
25.0000 mg | ORAL_TABLET | Freq: Every day | ORAL | Status: DC
  Administered 2019-12-05 – 2019-12-07 (×3): 25 mg via ORAL
  Filled 2019-12-05 (×3): qty 1

## 2019-12-05 MED ORDER — APIXABAN 5 MG PO TABS
5.0000 mg | ORAL_TABLET | Freq: Two times a day (BID) | ORAL | Status: DC
  Administered 2019-12-05 – 2019-12-07 (×4): 5 mg via ORAL
  Filled 2019-12-05 (×5): qty 1

## 2019-12-05 MED ORDER — ACETAMINOPHEN 650 MG RE SUPP: 650.0000 mg | Freq: Four times a day (QID) | RECTAL | Status: DC | PRN

## 2019-12-05 MED ORDER — AMLODIPINE BESYLATE 5 MG PO TABS
5.0000 mg | ORAL_TABLET | Freq: Every day | ORAL | Status: DC
  Administered 2019-12-05 – 2019-12-07 (×3): 5 mg via ORAL
  Filled 2019-12-05 (×3): qty 1

## 2019-12-05 MED ORDER — ASPIRIN EC 81 MG PO TBEC
81.0000 mg | DELAYED_RELEASE_TABLET | Freq: Every day | ORAL | Status: DC
  Administered 2019-12-05 – 2019-12-07 (×3): 81 mg via ORAL
  Filled 2019-12-05 (×3): qty 1

## 2019-12-05 MED ORDER — ACETAMINOPHEN 325 MG PO TABS
650.0000 mg | ORAL_TABLET | Freq: Four times a day (QID) | ORAL | Status: DC | PRN
  Administered 2019-12-05: 650 mg via ORAL
  Filled 2019-12-05: qty 2

## 2019-12-05 MED ORDER — SODIUM CHLORIDE 0.9 % IV SOLN
2.0000 g | Freq: Two times a day (BID) | INTRAVENOUS | Status: DC
  Administered 2019-12-05: 2 g via INTRAVENOUS
  Filled 2019-12-05: qty 2

## 2019-12-05 MED ORDER — DONEPEZIL HCL 10 MG PO TABS
10.0000 mg | ORAL_TABLET | Freq: Every day | ORAL | Status: DC
  Administered 2019-12-05 – 2019-12-06 (×2): 10 mg via ORAL
  Filled 2019-12-05: qty 2
  Filled 2019-12-05: qty 1

## 2019-12-05 MED ORDER — MEMANTINE HCL ER 28 MG PO CP24
28.0000 mg | ORAL_CAPSULE | Freq: Every day | ORAL | Status: DC
  Administered 2019-12-05 – 2019-12-07 (×3): 28 mg via ORAL
  Filled 2019-12-05 (×3): qty 1

## 2019-12-05 MED ORDER — LORATADINE 10 MG PO TABS
10.0000 mg | ORAL_TABLET | Freq: Every day | ORAL | Status: DC
  Administered 2019-12-05 – 2019-12-07 (×3): 10 mg via ORAL
  Filled 2019-12-05 (×3): qty 1

## 2019-12-05 MED ORDER — TAMSULOSIN HCL 0.4 MG PO CAPS
0.4000 mg | ORAL_CAPSULE | Freq: Every day | ORAL | Status: DC
  Administered 2019-12-05 – 2019-12-07 (×3): 0.4 mg via ORAL
  Filled 2019-12-05 (×3): qty 1

## 2019-12-05 MED ORDER — SODIUM CHLORIDE 0.9% FLUSH
3.0000 mL | Freq: Once | INTRAVENOUS | Status: AC
  Administered 2019-12-05: 12:00:00 3 mL via INTRAVENOUS

## 2019-12-05 MED ORDER — ONDANSETRON HCL 4 MG PO TABS: 4.0000 mg | ORAL_TABLET | Freq: Four times a day (QID) | ORAL | Status: DC | PRN

## 2019-12-05 MED ORDER — ADULT MULTIVITAMIN W/MINERALS CH
1.0000 | ORAL_TABLET | Freq: Every day | ORAL | Status: DC
  Administered 2019-12-05 – 2019-12-07 (×3): 1 via ORAL
  Filled 2019-12-05 (×3): qty 1

## 2019-12-05 MED ORDER — ONDANSETRON HCL 4 MG/2ML IJ SOLN: 4.0000 mg | Freq: Four times a day (QID) | INTRAMUSCULAR | Status: DC | PRN

## 2019-12-05 MED ORDER — SODIUM CHLORIDE 0.9 % IV SOLN
2.0000 g | INTRAVENOUS | Status: DC
  Administered 2019-12-05 – 2019-12-07 (×2): 2 g via INTRAVENOUS
  Filled 2019-12-05 (×2): qty 20

## 2019-12-05 MED ORDER — ALBUTEROL SULFATE (2.5 MG/3ML) 0.083% IN NEBU: 2.5000 mg | INHALATION_SOLUTION | RESPIRATORY_TRACT | Status: DC | PRN

## 2019-12-05 MED ORDER — VANCOMYCIN HCL 1500 MG/300ML IV SOLN
1500.0000 mg | Freq: Once | INTRAVENOUS | Status: AC
  Administered 2019-12-05: 1500 mg via INTRAVENOUS
  Filled 2019-12-05: qty 300

## 2019-12-05 MED ORDER — SIMVASTATIN 20 MG PO TABS
20.0000 mg | ORAL_TABLET | Freq: Every day | ORAL | Status: DC
  Administered 2019-12-05 – 2019-12-07 (×3): 20 mg via ORAL
  Filled 2019-12-05 (×3): qty 1

## 2019-12-05 MED ORDER — SODIUM CHLORIDE 0.9 % IV BOLUS
1000.0000 mL | Freq: Once | INTRAVENOUS | Status: AC
  Administered 2019-12-05: 12:00:00 1000 mL via INTRAVENOUS

## 2019-12-05 NOTE — ED Triage Notes (Signed)
Pt bib ptar from home for a wound check by daughter. Pt with sacral wound for approx 2-3 months. Pt got wound care last Friday and placed on abx. Today daughter noticed she could feel the bone when she was cleaning him today.  VSS with PTAR.

## 2019-12-05 NOTE — ED Notes (Signed)
Pt moved from room 20 to 14. Pt currently in MRI.

## 2019-12-05 NOTE — ED Notes (Signed)
MS  Dinner ordered 

## 2019-12-05 NOTE — Progress Notes (Signed)
Pharmacy Antibiotic Note  [name] is a 86 y.o. male admitted on 12/05/2019 with sacral wound infection.  Pharmacy has been consulted for vancomycin and cefepime dosing.  Plan: Vancomycin 1500 mg IV x 1, then 1250mg  IV every 24 hours Goal AUC 400-550. Expected AUC: 502 SCr used: 1.03 Cefepime 2g IV every 12 hours Monitor renal function, Cx and clinical progression to narrow Vancomycin levels at steady state      Temp (24hrs), Avg:98.5 F (36.9 C), Min:98.5 F (36.9 C), Max:98.5 F (36.9 C)  Recent Labs  Lab 12/05/19 0950 12/05/19 1006  WBC 10.8*  --   CREATININE  --  1.03    CrCl cannot be calculated (Unknown ideal weight.).    Allergies  Allergen Reactions  . Lisinopril Cough    Antimicrobials this admission: Vanco 5/5>> Cefepime 5/5>>  Dose adjustments this admission: n/a  Microbiology results: 5/5 BCx: sent   Bertis Ruddy, PharmD Clinical Pharmacist ED Pharmacist Phone # [phone] 12/05/2019 11:28 AM

## 2019-12-05 NOTE — Consult Note (Addendum)
WOC Nurse Consult Note: Patient receiving care in [facility] ED 020C.  Consult completed remotely after review of record, including photos of the wound to the sacrum. The orange Active FYI box contains ".DETECTED/POSITIVE/PRESUMPTIVE POSITIVE NOVEL CORONAVIRUS (2019-NCOV). Reason for Consult: "sacral wound".  Patient has been being seen at the outpatient wound center for 6 weeks per note from 11/30/19.  They have been using calcium alginate, also per the note on 11/30/19. Wound type: stage 4 PI Pressure Injury POA: Yes Measurement: To be provided by the bedside RN in the flowsheet section Wound bed: see photos Drainage (amount, consistency, odor)  Periwound: discolored, darkened Dressing procedure/placement/frequency:  Cleanse the sacral wound with saline. Insert an Aquacel dressing Kellie Simmering [phone]) into the wound bed. Cover with a foam dressing. Change daily and prn soilage. The silver in the Aquacel will provide anti-microbial properties and absorb drainage.  MRI imaging results of the area are pending; study obtained evaluating for potential osteomyelitis to the area.  The daughter report she feel bone exposed when performing wound care, which was not reported in the 11/30/19 MD note for wound characteristics.   Monitor the wound area(s) for worsening of condition such as: Signs/symptoms of infection,  Increase in size,  Development of or worsening of odor, Development of pain, or increased pain at the affected locations.  Notify the medical team if any of these develop.  Thank you for the consult. [place] nurse will not follow at this time.  Please re-consult the [place] team if needed.  Val Riles, RN, MSN, CWOCN, CNS-BC, pager [phone]

## 2019-12-05 NOTE — ED Notes (Signed)
Put patient on the monitor patient is resting with call bell in reach

## 2019-12-05 NOTE — ED Notes (Signed)
Daughter at bedside.

## 2019-12-05 NOTE — H&P (Signed)
History and Physical    [name] [id number] DOB: 1921/08/24 DOA: 12/05/2019  PCP: Gayland Curry, DO   Patient coming from: Home  I have personally briefly reviewed patient's old medical records in [place]  Chief Complaint: Worsening of sacral wound  HPI: [name] is a 86 y.o. male with medical history significant of advanced dementia, hypertension hyperlipidemia, IIDM, recent stroke in December 2020 with residual weakness, chronic ambulation dysfunction/bedbound since January 2021, chronic sacral pressure ulcer since March 2021, was sent by wound care nurse to evaluate worsening of the sacral wound.  Patient is demented, most of the history is provided by patient's daughter at bedside.  Daughter reported, patient has had worsening of ambulation functions after last year's stroke, sacral wound developed in early March this year, was never infected before this time, and has been following with Dr. Dellia Nims at wound care center every 2 weeks, last evaluation was 7 days ago.  Patient has been using a air mattress, and has visiting wound care nurse every week.  Daughter reported over the last 2 to 3 days the wound has become larger, she could not even touch the bone underneath the wound when doing dressing change.  There was no fever, no diarrhea, no cough. ED Course: MRI showed negative for osteomyelitis of the sacrum wound  Review of Systems: Unable to perform patient demanded  Past Medical History:  Diagnosis Date  . Arthritis   . CAD (coronary artery disease)    s/p DES to LAD 06/2013  . Dementia with behavioral disturbance ([place])   . Depression   . Dyslipidemia   . Glaucoma   . Hyperglycemia 04/17/2014  . Hyperlipidemia   . Hypertension   . Stroke ([place])   . Type 2 diabetes mellitus ([place])     Past Surgical History:  Procedure Laterality Date  . CARDIAC CATHETERIZATION  06/2013   DES to LAD by Dr Tamala Julian  . Right Cataract       reports that he quit smoking about  51 years ago. His smoking use included cigarettes. He has never used smokeless tobacco. He reports that he does not drink alcohol or use drugs.  Allergies  Allergen Reactions  . Lisinopril Cough    Family History  Problem Relation Age of Onset  . Arthritis Mother   . Breast cancer Mother   . Arthritis Father   . Dementia Father   . Diabetes Other   . Arthritis Other   . Colon cancer Other   . Stroke Other   . Diabetes type II Sister   . Suicidality Brother   . Diabetes type I Brother   . Diabetes type I Brother   . Diabetes type I Brother   . Diabetes type II Sister   . Breast cancer Daughter   . Emphysema Daughter      Prior to Admission medications   Medication Sig Start Date End Date Taking? Authorizing Provider  acetaminophen (TYLENOL) 500 MG tablet Take 500 mg by mouth every 6 (six) hours as needed for moderate pain.   Yes [provider]  albuterol (VENTOLIN HFA) 108 (90 Base) MCG/ACT inhaler Inhale 2 puffs into the lungs every 4 (four) hours as needed for wheezing or shortness of breath. INHALE TWO PUFFS EVERY 4-6 HOURS AS NEEDED FOR DIFFICULTY BREATHING,WHEEZING OR COUGH Patient taking differently: Inhale 2 puffs into the lungs every 4 (four) hours as needed for wheezing or shortness of breath.  06/13/19  Yes Blanchie Dessert, MD  amLODipine (  NORVASC) 5 MG tablet Take 1 tablet (5 mg total) by mouth daily. 11/12/19  Yes Reed, Tiffany L, DO  amoxicillin-clavulanate (AUGMENTIN) 875-125 MG tablet Take 1 tablet by mouth 2 (two) times daily. Start date : 11/30/19 11/30/19  Yes [provider]  apixaban (ELIQUIS) 5 MG TABS tablet Take 1 tablet (5 mg total) by mouth 2 (two) times daily. 11/12/19  Yes Reed, Tiffany L, DO  aspirin EC 81 MG tablet Take 1 tablet (81 mg total) by mouth daily. 06/13/19  Yes Plunkett, Loree Fee, MD  escitalopram (LEXAPRO) 5 MG tablet TAKE 1 TABLET EVERY DAY Patient taking differently: Take 5 mg by mouth daily.  11/05/19  Yes Reed, Tiffany  L, DO  hydrochlorothiazide (HYDRODIURIL) 25 MG tablet TAKE 1 TABLET EVERY DAY Patient taking differently: Take 25 mg by mouth daily.  11/05/19  Yes Reed, Tiffany L, DO  insulin glargine (LANTUS) 100 UNIT/ML injection Inject 8 Units into the skin daily.    Yes [provider]  Loratadine 10 MG CAPS Take 1 capsule (10 mg total) by mouth daily. 06/13/19  Yes Plunkett, Loree Fee, MD  losartan (COZAAR) 50 MG tablet TAKE 1 TABLET EVERY DAY Patient taking differently: Take 50 mg by mouth daily.  11/05/19  Yes Reed, Tiffany L, DO  Multiple Vitamins-Minerals (SENTRY SENIOR) TABS Take 1 tablet by mouth daily. 06/13/19  Yes Plunkett, Loree Fee, MD  NAMZARIC 28-10 MG CP24 TAKE 1 CAPSULE EVERY DAY Patient taking differently: Take 1 capsule by mouth daily.  11/05/19  Yes Reed, Tiffany L, DO  simvastatin (ZOCOR) 20 MG tablet Take 1 tablet (20 mg total) by mouth daily. 06/13/19  Yes Plunkett, Loree Fee, MD  tamsulosin (FLOMAX) 0.4 MG CAPS capsule Take 1 capsule (0.4 mg total) by mouth daily after supper. 06/13/19  Yes Blanchie Dessert, MD  Alcohol Swabs (SURE-PREP ALCOHOL PREP) 70 % PADS 1 each by Does not apply route as needed.    [provider]  Blood Glucose Monitoring Suppl (ACCU-CHEK AVIVA) device Use to test blood sugar three times daily. Dx E11.51 08/21/19   Reed, Tiffany L, DO  glucose blood (ACCU-CHEK AVIVA) test strip Use to test blood sugar three times daily. Dx: E11.51 08/31/18   Ngetich, Dinah C, NP  Insulin Pen Needle (B-D UF III MINI PEN NEEDLES) 31G X 5 MM MISC USE AS DIRECTED. 08/31/18   Ngetich, Dinah C, NP  Lancet Devices (ACCU-CHEK SOFTCLIX) lancets Use as instructed with checking blood sugar three times daily. Dx: E11.51 04/01/17   Gildardo Cranker, DO    Physical Exam: Vitals:   12/05/19 1215 12/05/19 1230 12/05/19 1245 12/05/19 1436  BP: 128/63 132/68 (!) 141/75 (!) 122/53  Pulse: 91 96 95 96  Resp: 16 (!) 31 (!) 25 20  Temp:      SpO2: 99% 100% 100% 98%  Weight:         Constitutional: NAD, calm, comfortable Vitals:   12/05/19 1215 12/05/19 1230 12/05/19 1245 12/05/19 1436  BP: 128/63 132/68 (!) 141/75 (!) 122/53  Pulse: 91 96 95 96  Resp: 16 (!) 31 (!) 25 20  Temp:      SpO2: 99% 100% 100% 98%  Weight:       Eyes: PERRL, lids and conjunctivae normal ENMT: Mucous membranes are moist. Posterior pharynx clear of any exudate or lesions.Normal dentition.  Neck: normal, supple, no masses, no thyromegaly Respiratory: clear to auscultation bilaterally, no wheezing, no crackles. Normal respiratory effort. No accessory muscle use.  Cardiovascular: Regular rate and rhythm, no murmurs /  rubs / gallops. No extremity edema. 2+ pedal pulses. No carotid bruits.  Abdomen: no tenderness, no masses palpated. No hepatosplenomegaly. Bowel sounds positive.  Musculoskeletal: no clubbing / cyanosis. No joint deformity upper and lower extremities. Good ROM, no contractures. Normal muscle tone.  Skin: Large sacral wound as shown in the picture Neurologic: Following simple command, moving all limbs Psychiatric: Demented        Labs on Admission: I have personally reviewed following labs and imaging studies  CBC: Recent Labs  Lab 12/05/19 0950  WBC 10.8*  NEUTROABS 6.6  HGB 7.3*  HCT 22.8*  MCV 92.7  PLT [id number]*   Basic Metabolic Panel: Recent Labs  Lab 12/05/19 1006  NA 134*  K 4.8  CL 101  CO2 26  GLUCOSE 180*  BUN 32*  CREATININE 1.03  CALCIUM 9.1   GFR: Estimated Creatinine Clearance: 47.1 mL/min (by C-G formula based on SCr of 1.03 mg/dL). Liver Function Tests: Recent Labs  Lab 12/05/19 1006  AST 29  ALT 28  ALKPHOS 66  BILITOT 0.2*  PROT 6.2*  ALBUMIN 2.4*   No results for input(s): LIPASE, AMYLASE in the last 168 hours. No results for input(s): AMMONIA in the last 168 hours. Coagulation Profile: No results for input(s): INR, PROTIME in the last 168 hours. Cardiac Enzymes: No results for input(s): CKTOTAL, CKMB, CKMBINDEX,  TROPONINI in the last 168 hours. BNP (last 3 results) No results for input(s): PROBNP in the last 8760 hours. HbA1C: No results for input(s): HGBA1C in the last 72 hours. CBG: No results for input(s): GLUCAP in the last 168 hours. Lipid Profile: No results for input(s): CHOL, HDL, LDLCALC, TRIG, CHOLHDL, LDLDIRECT in the last 72 hours. Thyroid Function Tests: No results for input(s): TSH, T4TOTAL, FREET4, T3FREE, THYROIDAB in the last 72 hours. Anemia Panel: No results for input(s): VITAMINB12, FOLATE, FERRITIN, TIBC, IRON, RETICCTPCT in the last 72 hours. Urine analysis:    Component Value Date/Time   COLORURINE YELLOW 05/28/2019 0640   APPEARANCEUR CLEAR 05/28/2019 0640   LABSPEC 1.012 05/28/2019 0640   PHURINE 5.0 05/28/2019 0640   GLUCOSEU NEGATIVE 05/28/2019 0640   HGBUR NEGATIVE 05/28/2019 0640   [place] Number Two NEGATIVE 05/28/2019 0640   KETONESUR NEGATIVE 05/28/2019 0640   PROTEINUR NEGATIVE 05/28/2019 0640   UROBILINOGEN 0.2 04/17/2014 1756   NITRITE NEGATIVE 05/28/2019 0640   LEUKOCYTESUR NEGATIVE 05/28/2019 0640    Radiological Exams on Admission: MR LUMBAR SPINE WO CONTRAST  Result Date: 12/05/2019 CLINICAL DATA:  Low back pain. Sacral decubitus ulcer with concern for osteomyelitis. EXAM: MRI LUMBAR SPINE WITHOUT CONTRAST TECHNIQUE: Multiplanar, multisequence MR imaging of the lumbar spine was performed. No intravenous contrast was administered. COMPARISON:  Lumbar spine radiographs 04/23/2009 FINDINGS: Segmentation: Transitional lumbosacral anatomy with partial sacralization of L5. Rudimentary L5-S1 disc. Alignment: Trace retrolisthesis of L2 on L3 and L4 on L5. mild lumbar dextroscoliosis. Vertebrae: No fracture, suspicious osseous lesion, or significant marrow edema to indicate acute osteomyelitis. Primarily [place] type 2 degenerative endplate changes at 075-GRM asymmetric to the right. Conus medullaris and cauda equina: Conus extends to the upper L1 level. Conus and cauda  equina appear normal. Paraspinal and other soft tissues: T2 hyperintense lesions in both kidneys compatible with cysts including a 3.1 cm cyst in the interpolar left kidney and a 3.2 cm exophytic cyst in the upper pole of the right kidney with a small amount of layering blood products. Symmetric bilateral posterior paraspinal muscle edema primarily in the lower lumbar spine. Disc levels: Disc desiccation from L1-2  to L4-5. T12-L1: Negative. L1-2: Minimal disc bulging and mild facet hypertrophy without stenosis. L2-3: Disc bulging, a left foraminal disc protrusion with annular fissure, and mild facet hypertrophy result in mild left greater than right lateral recess stenosis and mild left greater than right neural foraminal stenosis without spinal stenosis. L3-4: Disc bulging and moderate facet hypertrophy result in mild-to-moderate left greater than right lateral recess stenosis and moderate bilateral neural foraminal stenosis without spinal stenosis. L4-5: Disc bulging and mild facet hypertrophy result in mild bilateral lateral recess stenosis and moderate right greater than left neural foraminal stenosis without spinal stenosis. L5-S1: Only imaged sagittally. Transitional anatomy with rudimentary disc. No stenosis. IMPRESSION: 1. Transitional lumbosacral anatomy. 2. No evidence of discitis/osteomyelitis. 3. Nonspecific bilateral paraspinal muscle edema. 4. Focally advanced disc degeneration at L4-5 with moderate neural foraminal stenosis. 5. Mild-to-moderate lateral recess and moderate neural foraminal stenosis at L3-4. Electronically Signed   By: Logan Bores M.D.   On: 12/05/2019 14:34    EKG: None  Assessment/Plan Active Problems:   Stage III pressure ulcer of sacral region (HCC)   Osteomyelitis (HCC)  Sacral pressure ulcer infected POA -MRI showed negative for osteomyelitis, however, given the sudden change of characters of the wound, such as, the size is enlarged, borders looks like more necrotic  tissue, as well as bone exposed, likely has acute infection.  Will cover with broad-spectrum antibiotics.  Patient's age and ambulation functional status, probably a poor candidate for any surgical debridement.  Long discussion with patient daughter regarding prognosis, which I considered to be poor.   -Vancomycin and cefepime even in the ER, as of now, there is no S/S of sepsis, and given most recent wound culture on 04/30 showing pansensitive E. coli as shown on April 30, will de-escalate to ceftriaxone,-Follow-up on wound care team's recommendation  Chronic ambulatory dysfunction PT evaluation, overall prognosis poor, given the patient has been bedbound for more than 3 months.  Chronic anemia Baseline is 7.9, today 7.3, will check iron studies and reticulocyte counts.  Advanced dementia 100% dependent, at his baseline.  IDDM Sliding scale for now  HTN Fairly controlled, continue home meds  Chronic A. Fib Doubt patient will need surgery this time, continue anticoagulation  DVT prophylaxis: Eliquis Code Status: Full code Family Communication: Daughter at bedside Disposition Plan: PT evaluation, probably will need SNF, however overall function status is poor, doubt patient will benefit much from rehab Consults called: None Admission status: MedSurg   Lequita Halt MD [facility] Pager [phone]    12/05/2019, 5:07 PM

## 2019-12-05 NOTE — ED Provider Notes (Addendum)
[facility] EMERGENCY DEPARTMENT Provider Note   CSN: [id number] Arrival date & time: 12/05/19  0941     History Chief Complaint  Patient presents with  . Wound Check    [name] is a 86 y.o. male with a history of dementia, coronary artery disease, type 2 diabetes, hearing loss, presenting to emergency department with family concern for wound infection.  His daughter reports patient has had a sacral decubitus ulcer for approximately 1 month for which to follow with the wound care clinic.  They have been packing this at home.  She was concerned today as she was changing the dressing that "for the first time I could feel bone."  She feels like the wound is deepening and not improving.  She noted purulent smell as well.  She denies any fevers or chills at home.  Patient himself is demented and cannot provide further history.  HPI     Past Medical History:  Diagnosis Date  . Arthritis   . CAD (coronary artery disease)    s/p DES to LAD 06/2013  . Dementia with behavioral disturbance ([place])   . Depression   . Dyslipidemia   . Glaucoma   . Hyperglycemia 04/17/2014  . Hyperlipidemia   . Hypertension   . Stroke ([place])   . Type 2 diabetes mellitus [facility])     Patient Active Problem List   Diagnosis Date Noted  . Osteomyelitis ([place]) 12/05/2019  . Wound infection   . Pressure injury of sacral region, stage 4 ([place]) 10/12/2019  . Vascular dementia with behavior disturbance ([place]) 10/12/2019  . Bilateral hearing loss due to cerumen impaction 10/12/2019  . Debility 10/12/2019  . Pneumonia due to 2019 novel coronavirus 07/06/2019  . Pneumonia due to COVID-19 virus 07/05/2019  . Pulmonary embolism ([place]) 06/01/2019  . Acute renal failure superimposed on stage 3 chronic kidney disease ([place]) 06/01/2019  . Chronic diastolic CHF (congestive heart failure) ([place]) 06/01/2019  . Pain due to onychomycosis of toenails of both feet 01/23/2019  . Bilateral lower extremity edema  07/05/2017  . Pulmonary nodules 04/13/2017  . Centrilobular emphysema ([place]) 04/13/2017  . Calculus of gallbladder without cholecystitis without obstruction 04/13/2017  . Chronic cough 04/13/2017  . Adenoma of left adrenal gland 04/13/2017  . Frequent falls 04/01/2017  . Type 2 diabetes mellitus with stage 3 chronic kidney disease, with long-term current use of insulin ([place]) 04/01/2017  . History of tobacco abuse 04/01/2017  . Unsteady gait 04/01/2017  . Dysarthria 04/01/2017  . History of stroke   . Hyperlipidemia   . End of life care 11/19/2015  . Moderate dementia with behavioral disturbance ([place]) 09/19/2015  . Depression 09/19/2015  . Hypertensive heart disease 09/10/2015  . Type 2 diabetes, controlled, with peripheral circulatory disorder ([place])   . Essential hypertension   . Coronary artery disease involving native coronary artery of native heart with angina pectoris ([place])   . Arthritis   . Glaucoma     Past Surgical History:  Procedure Laterality Date  . CARDIAC CATHETERIZATION  06/2013   DES to LAD by Dr Tamala Julian  . Right Cataract         Family History  Problem Relation Age of Onset  . Arthritis Mother   . Breast cancer Mother   . Arthritis Father   . Dementia Father   . Diabetes Other   . Arthritis Other   . Colon cancer Other   . Stroke Other   . Diabetes type II Sister   .  Suicidality Brother   . Diabetes type I Brother   . Diabetes type I Brother   . Diabetes type I Brother   . Diabetes type II Sister   . Breast cancer Daughter   . Emphysema Daughter     Social History   Tobacco Use  . Smoking status: Former Smoker    Types: Cigarettes    Quit date: 08/02/1968    Years since quitting: 51.3  . Smokeless tobacco: Never Used  Substance Use Topics  . Alcohol use: No    Alcohol/week: 0.0 standard drinks  . Drug use: No    Home Medications Prior to Admission medications   Medication Sig Start Date End Date Taking? Authorizing Provider    acetaminophen (TYLENOL) 500 MG tablet Take 500 mg by mouth every 6 (six) hours as needed for moderate pain.   Yes [provider]  albuterol (VENTOLIN HFA) 108 (90 Base) MCG/ACT inhaler Inhale 2 puffs into the lungs every 4 (four) hours as needed for wheezing or shortness of breath. INHALE TWO PUFFS EVERY 4-6 HOURS AS NEEDED FOR DIFFICULTY BREATHING,WHEEZING OR COUGH Patient taking differently: Inhale 2 puffs into the lungs every 4 (four) hours as needed for wheezing or shortness of breath.  06/13/19  Yes Plunkett, Loree Fee, MD  amLODipine (NORVASC) 5 MG tablet Take 1 tablet (5 mg total) by mouth daily. 11/12/19  Yes Reed, Tiffany L, DO  amoxicillin-clavulanate (AUGMENTIN) 875-125 MG tablet Take 1 tablet by mouth 2 (two) times daily. Start date : 11/30/19 11/30/19  Yes [provider]  apixaban (ELIQUIS) 5 MG TABS tablet Take 1 tablet (5 mg total) by mouth 2 (two) times daily. 11/12/19  Yes Reed, Tiffany L, DO  aspirin EC 81 MG tablet Take 1 tablet (81 mg total) by mouth daily. 06/13/19  Yes Plunkett, Loree Fee, MD  escitalopram (LEXAPRO) 5 MG tablet TAKE 1 TABLET EVERY DAY Patient taking differently: Take 5 mg by mouth daily.  11/05/19  Yes Reed, Tiffany L, DO  hydrochlorothiazide (HYDRODIURIL) 25 MG tablet TAKE 1 TABLET EVERY DAY Patient taking differently: Take 25 mg by mouth daily.  11/05/19  Yes Reed, Tiffany L, DO  insulin glargine (LANTUS) 100 UNIT/ML injection Inject 8 Units into the skin daily.    Yes [provider]  Loratadine 10 MG CAPS Take 1 capsule (10 mg total) by mouth daily. 06/13/19  Yes Plunkett, Loree Fee, MD  losartan (COZAAR) 50 MG tablet TAKE 1 TABLET EVERY DAY Patient taking differently: Take 50 mg by mouth daily.  11/05/19  Yes Reed, Tiffany L, DO  Multiple Vitamins-Minerals (SENTRY SENIOR) TABS Take 1 tablet by mouth daily. 06/13/19  Yes Plunkett, Loree Fee, MD  NAMZARIC 28-10 MG CP24 TAKE 1 CAPSULE EVERY DAY Patient taking differently: Take 1 capsule by mouth  daily.  11/05/19  Yes Reed, Tiffany L, DO  simvastatin (ZOCOR) 20 MG tablet Take 1 tablet (20 mg total) by mouth daily. 06/13/19  Yes Plunkett, Loree Fee, MD  tamsulosin (FLOMAX) 0.4 MG CAPS capsule Take 1 capsule (0.4 mg total) by mouth daily after supper. 06/13/19  Yes Blanchie Dessert, MD  Alcohol Swabs (SURE-PREP ALCOHOL PREP) 70 % PADS 1 each by Does not apply route as needed.    [provider]  Blood Glucose Monitoring Suppl (ACCU-CHEK AVIVA) device Use to test blood sugar three times daily. Dx E11.51 08/21/19   Reed, Tiffany L, DO  glucose blood (ACCU-CHEK AVIVA) test strip Use to test blood sugar three times daily. Dx: E11.51 08/31/18   Ngetich, Nelda Bucks,  NP  Insulin Pen Needle (B-D UF III MINI PEN NEEDLES) 31G X 5 MM MISC USE AS DIRECTED. 08/31/18   Ngetich, Dinah C, NP  Lancet Devices (ACCU-CHEK SOFTCLIX) lancets Use as instructed with checking blood sugar three times daily. Dx: E11.51 04/01/17   Gildardo Cranker, DO    Allergies    Lisinopril  Review of Systems   Review of Systems  Unable to perform ROS: Dementia (level 5 caveat)    Physical Exam Updated Vital Signs BP (!) 122/53 (BP Location: Right Arm)   Pulse 96   Temp 98.5 F (36.9 C)   Resp 20   Wt 79.4 kg   SpO2 98%   BMI 23.73 kg/m   Physical Exam Vitals and nursing note reviewed.  Constitutional:      Appearance: He is well-developed.  HENT:     Head: Normocephalic and atraumatic.  Eyes:     Conjunctiva/sclera: Conjunctivae normal.  Cardiovascular:     Rate and Rhythm: Normal rate and regular rhythm.     Pulses: Normal pulses.  Pulmonary:     Effort: Pulmonary effort is normal. No respiratory distress.  Abdominal:     Palpations: Abdomen is soft.     Tenderness: There is no abdominal tenderness.  Musculoskeletal:     Cervical back: Neck supple.     Comments: Stage 4, four cm sacral decubitus ulcer tunneling to bone, with some necrotic tissue, initially packed  Skin:    General: Skin is warm and  dry.  Neurological:     Mental Status: He is alert. Mental status is at baseline.         ED Results / Procedures / Treatments   Labs (all labs ordered are listed, but only abnormal results are displayed) Labs Reviewed  COMPREHENSIVE METABOLIC PANEL - Abnormal; Notable for the following components:      Result Value   Sodium 134 (*)    Glucose, Bld 180 (*)    BUN 32 (*)    Total Protein 6.2 (*)    Albumin 2.4 (*)    Total Bilirubin 0.2 (*)    All other components within normal limits  CBC WITH DIFFERENTIAL/PLATELET - Abnormal; Notable for the following components:   WBC 10.8 (*)    RBC 2.46 (*)    Hemoglobin 7.3 (*)    HCT 22.8 (*)    Platelets 493 (*)    Monocytes Absolute 1.1 (*)    Abs Immature Granulocytes 0.11 (*)    All other components within normal limits  C-REACTIVE PROTEIN - Abnormal; Notable for the following components:   CRP 15.3 (*)    All other components within normal limits  SEDIMENTATION RATE - Abnormal; Notable for the following components:   Sed Rate 137 (*)    All other components within normal limits  CULTURE, BLOOD (ROUTINE X 2)  CULTURE, BLOOD (ROUTINE X 2)  SARS CORONAVIRUS 2 (TAT 6-24 HRS)  BASIC METABOLIC PANEL  CBC  IRON AND TIBC  FERRITIN  RETICULOCYTES    EKG None  Radiology MR LUMBAR SPINE WO CONTRAST  Result Date: 12/05/2019 CLINICAL DATA:  Low back pain. Sacral decubitus ulcer with concern for osteomyelitis. EXAM: MRI LUMBAR SPINE WITHOUT CONTRAST TECHNIQUE: Multiplanar, multisequence MR imaging of the lumbar spine was performed. No intravenous contrast was administered. COMPARISON:  Lumbar spine radiographs 04/23/2009 FINDINGS: Segmentation: Transitional lumbosacral anatomy with partial sacralization of L5. Rudimentary L5-S1 disc. Alignment: Trace retrolisthesis of L2 on L3 and L4 on L5. mild lumbar dextroscoliosis. Vertebrae: No  fracture, suspicious osseous lesion, or significant marrow edema to indicate acute osteomyelitis.  Primarily Modic type 2 degenerative endplate changes at R4-4 asymmetric to the right. Conus medullaris and cauda equina: Conus extends to the upper L1 level. Conus and cauda equina appear normal. Paraspinal and other soft tissues: T2 hyperintense lesions in both kidneys compatible with cysts including a 3.1 cm cyst in the interpolar left kidney and a 3.2 cm exophytic cyst in the upper pole of the right kidney with a small amount of layering blood products. Symmetric bilateral posterior paraspinal muscle edema primarily in the lower lumbar spine. Disc levels: Disc desiccation from L1-2 to L4-5. T12-L1: Negative. L1-2: Minimal disc bulging and mild facet hypertrophy without stenosis. L2-3: Disc bulging, a left foraminal disc protrusion with annular fissure, and mild facet hypertrophy result in mild left greater than right lateral recess stenosis and mild left greater than right neural foraminal stenosis without spinal stenosis. L3-4: Disc bulging and moderate facet hypertrophy result in mild-to-moderate left greater than right lateral recess stenosis and moderate bilateral neural foraminal stenosis without spinal stenosis. L4-5: Disc bulging and mild facet hypertrophy result in mild bilateral lateral recess stenosis and moderate right greater than left neural foraminal stenosis without spinal stenosis. L5-S1: Only imaged sagittally. Transitional anatomy with rudimentary disc. No stenosis. IMPRESSION: 1. Transitional lumbosacral anatomy. 2. No evidence of discitis/osteomyelitis. 3. Nonspecific bilateral paraspinal muscle edema. 4. Focally advanced disc degeneration at L4-5 with moderate neural foraminal stenosis. 5. Mild-to-moderate lateral recess and moderate neural foraminal stenosis at L3-4. Electronically Signed   By: Logan Bores M.D.   On: 12/05/2019 14:34    Procedures .Critical Care Performed by: Wyvonnia Dusky, MD Authorized by: Wyvonnia Dusky, MD   Critical care provider statement:    Critical  care time (minutes):  35   Critical care was necessary to treat or prevent imminent or life-threatening deterioration of the following conditions:  Sepsis   Critical care was time spent personally by me on the following activities:  Discussions with consultants, evaluation of patient's response to treatment, examination of patient, ordering and performing treatments and interventions, ordering and review of laboratory studies, ordering and review of radiographic studies, pulse oximetry, re-evaluation of patient's condition, obtaining history from patient or surrogate and review of old charts Comments:     Osteomyelitis workup and IV antibiotics    (including critical care time)  Medications Ordered in ED Medications  aspirin EC tablet 81 mg (81 mg Oral Given 12/05/19 1729)  amLODipine (NORVASC) tablet 5 mg (5 mg Oral Given 12/05/19 1728)  hydrochlorothiazide (HYDRODIURIL) tablet 25 mg (25 mg Oral Given 12/05/19 1729)  losartan (COZAAR) tablet 50 mg (50 mg Oral Given 12/05/19 1729)  simvastatin (ZOCOR) tablet 20 mg (20 mg Oral Given 12/05/19 1728)  escitalopram (LEXAPRO) tablet 5 mg (5 mg Oral Given 12/05/19 1729)  memantine (NAMENDA XR) 24 hr capsule 28 mg (28 mg Oral Given 12/05/19 1730)  tamsulosin (FLOMAX) capsule 0.4 mg (has no administration in time range)  apixaban (ELIQUIS) tablet 5 mg (has no administration in time range)  multivitamin with minerals tablet 1 tablet (1 tablet Oral Given 12/05/19 1728)  albuterol (PROVENTIL) (2.5 MG/3ML) 0.083% nebulizer solution 2.5 mg (has no administration in time range)  loratadine (CLARITIN) tablet 10 mg (10 mg Oral Given 12/05/19 1729)  acetaminophen (TYLENOL) tablet 650 mg (has no administration in time range)    Or  acetaminophen (TYLENOL) suppository 650 mg (has no administration in time range)  ondansetron (ZOFRAN) tablet 4 mg (  has no administration in time range)    Or  ondansetron (ZOFRAN) injection 4 mg (has no administration in time range)  insulin  aspart (novoLOG) injection 0-9 Units (has no administration in time range)  donepezil (ARICEPT) tablet 10 mg (has no administration in time range)  cefTRIAXone (ROCEPHIN) 2 g in sodium chloride 0.9 % 100 mL IVPB (has no administration in time range)  sodium chloride flush (NS) 0.9 % injection 3 mL (3 mLs Intravenous Given 12/05/19 1225)  sodium chloride 0.9 % bolus 1,000 mL (0 mLs Intravenous Stopped 12/05/19 1404)  vancomycin (VANCOREADY) IVPB 1500 mg/300 mL (0 mg Intravenous Stopped 12/05/19 1739)    ED Course  I have reviewed the triage vital signs and the nursing notes.  Pertinent labs & imaging results that were available during my care of the patient were reviewed by me and considered in my medical decision making (see chart for details).  86 yo male w/ dementia presenting with sacral decubitus ulcer, with family concern that it is worsening, tunneling deeper to bone  Patient follows at wound clinic for past month or so.  Per those notes, this wound did not appear quite as deep as what I'm seeing today.   Labs with borderling leukocytosis here We'll add on ESR, CRP, blood cultures MRI Lumbar spine IV antibiotics ordered for osteomyelitis coverage  Some mildly worsening anemia (likely chronic) noted on CBC.  Doubtful of acute or massive blood loss at this time.  I would hold off on transfusion now as he has no signs or symptoms of acute anemia.   Additional history provided by his daughter and caretaker Charts reviewed including wound care notes  Patient is medical stable for hospital admission, not requiring ICU at this time  On reassessment he had no acute complaints.  Clinical Course as of Dec 04 1812  Wed Dec 05, 2019  1247 Admitted Dr. Roosevelt Locks the hospitalist for osteomyelitis evaluation and work-up.   [MT]    Clinical Course User Index [MT] Cray Monnin, Carola Rhine, MD    Final Clinical Impression(s) / ED Diagnoses Final diagnoses:  Pressure injury of sacral region, stage 4  ([place])  Wound infection  Anemia, unspecified type    Rx / DC Orders ED Discharge Orders    None       Tonimarie Gritz, Carola Rhine, MD 12/05/19 1815    Wyvonnia Dusky, MD 12/05/19 [phone]

## 2019-12-06 DIAGNOSIS — F0390 Unspecified dementia without behavioral disturbance: Secondary | ICD-10-CM

## 2019-12-06 DIAGNOSIS — Z7189 Other specified counseling: Secondary | ICD-10-CM

## 2019-12-06 DIAGNOSIS — Z515 Encounter for palliative care: Secondary | ICD-10-CM

## 2019-12-06 LAB — GLUCOSE, CAPILLARY
Glucose-Capillary: 149 mg/dL — ABNORMAL HIGH (ref 70–99)
Glucose-Capillary: 133 mg/dL — ABNORMAL HIGH (ref 70–99)
Glucose-Capillary: 275 mg/dL — ABNORMAL HIGH (ref 70–99)
Glucose-Capillary: 203 mg/dL — ABNORMAL HIGH (ref 70–99)

## 2019-12-06 LAB — CBC
HCT: 20.9 % — ABNORMAL LOW (ref 39.0–52.0)
Hemoglobin: 6.6 g/dL — CL (ref 13.0–17.0)
MCH: 29.2 pg (ref 26.0–34.0)
MCHC: 31.6 g/dL (ref 30.0–36.0)
MCV: 92.5 fL (ref 80.0–100.0)
Platelets: 482 10*3/uL — ABNORMAL HIGH (ref 150–400)
RBC: 2.26 MIL/uL — ABNORMAL LOW (ref 4.22–5.81)
RDW: 13.7 % (ref 11.5–15.5)
WBC: 11.3 10*3/uL — ABNORMAL HIGH (ref 4.0–10.5)
nRBC: 0 % (ref 0.0–0.2)

## 2019-12-06 LAB — PREPARE RBC (CROSSMATCH)

## 2019-12-06 LAB — BASIC METABOLIC PANEL
Anion gap: 8 (ref 5–15)
BUN: 27 mg/dL — ABNORMAL HIGH (ref 8–23)
CO2: 27 mmol/L (ref 22–32)
Calcium: 9 mg/dL (ref 8.9–10.3)
Chloride: 102 mmol/L (ref 98–111)
Creatinine, Ser: 1.09 mg/dL (ref 0.61–1.24)
GFR calc Af Amer: >60 mL/min (ref >60)
GFR calc non Af Amer: 57 mL/min — ABNORMAL LOW (ref >60)
Glucose, Bld: 257 mg/dL — ABNORMAL HIGH (ref 70–99)
Potassium: 5.4 mmol/L — ABNORMAL HIGH (ref 3.5–5.1)
Sodium: 137 mmol/L (ref 135–145)

## 2019-12-06 LAB — AEROBIC/ANAEROBIC CULTURE W GRAM STAIN (SURGICAL/DEEP WOUND)

## 2019-12-06 LAB — SARS CORONAVIRUS 2 (TAT 6-24 HRS): SARS Coronavirus 2: NEGATIVE

## 2019-12-06 MED ORDER — SODIUM CHLORIDE 0.9% IV SOLUTION: Freq: Once | INTRAVENOUS | Status: AC

## 2019-12-06 MED ORDER — SODIUM POLYSTYRENE SULFONATE 15 GM/60ML PO SUSP
15.0000 g | Freq: Once | ORAL | Status: AC
  Administered 2019-12-06: 20:00:00 15 g via ORAL
  Filled 2019-12-06: qty 60

## 2019-12-06 MED ORDER — ENSURE ENLIVE PO LIQD
237.0000 mL | Freq: Two times a day (BID) | ORAL | Status: DC
  Administered 2019-12-07: 10:00:00 237 mL via ORAL

## 2019-12-06 NOTE — Evaluation (Signed)
Physical Therapy Evaluation Patient Details Name: [name] MRN: [id number] DOB: [birth year] Today's Date: 12/06/2019   History of Present Illness  86 y.o. male with medical history significant of advanced dementia, hypertension hyperlipidemia, IIDM, stroke with residual weakness, COVID december 2020, chronic ambulation dysfunction/bedbound since January 2021, chronic sacral pressure ulcer since March 2021, was sent by wound care nurse to evaluate worsening of the sacral wound.   Clinical Impression  Pt presents to PT with significant cognitive deficits, weakness, impaired balance and mobility, reduced ROM 2/2 pain in BLE. Pt with impaired command following and ability to participate in history 2/2 advanced dementia. Pt requires max cues and is able to inconsistently follow motor commands during session, <25% of commands. Per chart review pt has had limited mobility since December and requires maxA for all mobility and to maintain balance this session. Pt with poor prognosis to progress with PT due to great weakness and impaired ability to follow commands 2/2 dementia. Pt will benefit from continued acute PT POC to improve mobility and reduce caregiver burden. PT will need to confirm history with family and assess family goals for mobility and therapy at next session.    Follow Up Recommendations SNF;Supervision/Assistance - 24 hour(vs HHPT pending family goals)    [facility] bed;Wheelchair (measurements PT);Wheelchair cushion (measurements PT);Other (comment)(mechanical lift)    Recommendations for Other Services       Precautions / Restrictions Precautions Precautions: Fall Precaution Comments: dementia Restrictions Weight Bearing Restrictions: No      Mobility  Bed Mobility Overal bed mobility: Needs Assistance Bed Mobility: Sit to Supine;Supine to Sit     Supine to sit: Max assist Sit to supine: Max assist      Transfers                     Ambulation/Gait                Stairs            Wheelchair Mobility    Modified Rankin (Stroke Patients Only)       Balance Overall balance assessment: Needs assistance Sitting-balance support: Feet supported;Bilateral upper extremity supported Sitting balance-Leahy Scale: Zero Sitting balance - Comments: maxA to sit statically at edge of bed Postural control: Posterior lean                                   Pertinent Vitals/Pain Pain Assessment: Faces Faces Pain Scale: Hurts whole lot Pain Location: knees Pain Descriptors / Indicators: Moaning Pain Intervention(s): Monitored during session    Home Living Family/patient expects to be discharged to:: Unsure                 Additional Comments: PLOF obtained from chart, family not present and pt unable to provide accurate history 2/2 advanced dementia    Prior Function Level of Independence: Needs assistance   Gait / Transfers Assistance Needed: per chart pt largely bedbound for last few months since stroke in december 2020, was walking some prior to stroke           Hand Dominance   Dominant Hand: Right    Extremity/Trunk Assessment   Upper Extremity Assessment Upper Extremity Assessment: Generalized weakness;RUE deficits/detail RUE Deficits / Details: RUE weaker than left with some flexion posturing noticed during mobility    Lower Extremity Assessment Lower Extremity Assessment: RLE deficits/detail;LLE deficits/detail;Difficult to assess due to impaired  cognition RLE Deficits / Details: assessment limited 2/2 impaired cognition, generalized weakness, no increase in tone noted, impaired sensation in RLE, only reports feeling painful stimuli on RLE LLE Deficits / Details: limited assessment 2/2 cognitive deficits, generally weak with limited AROM noted, pt with pain in L knee during PROM, sensation to light touch moreso intact on LLE than RLE    Cervical / Trunk  Assessment Cervical / Trunk Assessment: Kyphotic  Communication   Communication: HOH  Cognition Arousal/Alertness: Awake/alert Behavior During Therapy: WFL for tasks assessed/performed Overall Cognitive Status: History of cognitive impairments - at baseline                                 General Comments: pt with significant cognitive deficits, reports birth year as [birth year] instead of [birth year]. Pt with garbled speech and responding in short one to two word phrases during session. Pt also often repeating what PT says during session. Pt unable to provide history but is oriented to person, place with choice of 2.      General Comments General comments (skin integrity, edema, etc.): VSS on RA    Exercises     Assessment/Plan    PT Assessment Patient needs continued PT services  PT Problem List Decreased strength;Decreased activity tolerance;Decreased balance;Decreased mobility;Decreased range of motion;Decreased cognition;Decreased knowledge of use of DME;Decreased safety awareness;Decreased knowledge of precautions;Pain       PT Treatment Interventions DME instruction;Functional mobility training;Therapeutic activities;Therapeutic exercise;Balance training;Neuromuscular re-education;Patient/family education    PT Goals (Current goals can be found in the Care Plan section)  Acute Rehab PT Goals Patient Stated Goal: Pt unable to participate in goal setting, PT goal to improve bed mobility and sitting balance PT Goal Formulation: With patient Time For Goal Achievement: 12/20/19 Potential to Achieve Goals: Poor    Frequency Min 2X/week   Barriers to discharge        Co-evaluation               AM-PAC PT "6 Clicks" Mobility  Outcome Measure Help needed turning from your back to your side while in a flat bed without using bedrails?: Total Help needed moving from lying on your back to sitting on the side of a flat bed without using bedrails?: Total Help needed moving  to and from a bed to a chair (including a wheelchair)?: Total Help needed standing up from a chair using your arms (e.g., wheelchair or bedside chair)?: Total Help needed to walk in hospital room?: Total Help needed climbing 3-5 steps with a railing? : Total 6 Click Score: 6    End of Session   Activity Tolerance: Patient tolerated treatment well Patient left: in bed;with call bell/phone within reach;with bed alarm set Nurse Communication: Mobility status;Need for lift equipment PT Visit Diagnosis: Muscle weakness (generalized) (M62.81);Other symptoms and signs involving the nervous system [id number])    Time: [id number] PT Time Calculation (min) (ACUTE ONLY): 21 min   Charges:   PT Evaluation $PT Eval Moderate Complexity: 1 Mod          Zenaida Niece, PT, DPT Acute Rehabilitation Pager: [phone]   Zenaida Niece 12/06/2019, 10:17 AM

## 2019-12-06 NOTE — Progress Notes (Signed)
CRITICAL VALUE ALERT  Critical Value:  6.6 Hemoglobin  Date & Time Notied: 1745  Provider Notified: Birder Robson  Orders Received/Actions taken: Provider to place orders

## 2019-12-06 NOTE — Discharge Instructions (Signed)

## 2019-12-06 NOTE — Evaluation (Signed)
Clinical/Bedside Swallow Evaluation Patient Details  Name: [name] MRN: [id number] Date of Birth: 18-Jul-1922  Today's Date: 12/06/2019 Time: SLP Start Time (ACUTE ONLY): 1640 SLP Stop Time (ACUTE ONLY): 1655 SLP Time Calculation (min) (ACUTE ONLY): 15 min  Past Medical History:  Past Medical History:  Diagnosis Date  . Arthritis   . CAD (coronary artery disease)    s/p DES to LAD 06/2013  . Dementia with behavioral disturbance ([place])   . Depression   . Dyslipidemia   . Glaucoma   . Hyperglycemia 04/17/2014  . Hyperlipidemia   . Hypertension   . Stroke ([place])   . Type 2 diabetes mellitus ([place])    Past Surgical History:  Past Surgical History:  Procedure Laterality Date  . CARDIAC CATHETERIZATION  06/2013   DES to LAD by Dr Tamala Julian  . Right Cataract     HPI:  Pt is a 86 y.o. male with medical history significant of advanced dementia, hypertension hyperlipidemia, IIDM, recent stroke in December 2020 with residual weakness, chronic ambulation dysfunction/bedbound since January 2021, chronic sacral pressure ulcer since March 2021, who was sent by wound care nurse to evaluate worsening of the sacral wound. MBS in September, 2020: minimal oral dysphagia c/b mild discoordiniation of lingual musculature with delay in oral transiting/prematuer spillage of boluses into phayrnx.  A single incident of pt demonstrating difficulty orally transiting barium tablet with thin noted due to discoordination. This resulted in premature spillage of boluses into pharynx/larynx with coughing and pt expectorating tablet.  Trace laryngeal penetrates were cleared with reflexive throat clear.  Mild delay in oral transiting.  Strong and timely pharyngeal swallow without residuals. A dysphagia 3 diet with thin liquids was recommended at that time.    Assessment / Plan / Recommendation Clinical Impression  Pt was seen for bedside swallow evaluation with his daughter present. She reported that the pt has been  consuming a puree diet and "very soft' fruits since December, 2020. Pt was edentulous and pt's daughter reported that the pt does not use the dentures that he owns. She indicated that she has attempted to have him use them at home but that they have not improved mastication. She denied any symptoms of pharyngeal dysphagia including signs of aspiration. No signs or symptoms of aspiration were noted with any solids or liquids. Mastication was prolonged with dysphagia 3 solids but ineffective and pt ultimately spat out inadequately masticated boluses. A dysphagia 1 (puree) diet with thin liquids is recommended at this time with observance of swallowing precautions to reduce aspiration risk secondary to dementia. Further skilled SLP services are not clinically indicated at this time for swallowing.  SLP Visit Diagnosis: Dysphagia, oral phase (R13.11)    Aspiration Risk  Mild aspiration risk    Diet Recommendation Dysphagia 1 (Puree);Thin liquid   Liquid Administration via: Cup;Straw Medication Administration: Crushed with puree Compensations: Minimize environmental distractions;Slow rate;Small sips/bites;Follow solids with liquid Postural Changes: Seated upright at 90 degrees    Other  Recommendations Oral Care Recommendations: Oral care BID;Staff/trained caregiver to provide oral care   Follow up Recommendations None      Frequency and Duration            Prognosis Prognosis for Safe Diet Advancement: Guarded Barriers to Reach Goals: Cognitive deficits      Swallow Study   General Date of Onset: 07/03/19 HPI: Pt is a 86 y.o. male with medical history significant of advanced dementia, hypertension hyperlipidemia, IIDM, recent stroke in December 2020 with residual weakness, chronic  ambulation dysfunction/bedbound since January 2021, chronic sacral pressure ulcer since March 2021, who was sent by wound care nurse to evaluate worsening of the sacral wound. MBS in September, 2020: minimal oral  dysphagia c/b mild discoordiniation of lingual musculature with delay in oral transiting/prematuer spillage of boluses into phayrnx.  A single incident of pt demonstrating difficulty orally transiting barium tablet with thin noted due to discoordination. This resulted in premature spillage of boluses into pharynx/larynx with coughing and pt expectorating tablet.  Trace laryngeal penetrates were cleared with reflexive throat clear.  Mild delay in oral transiting.  Strong and timely pharyngeal swallow without residuals. A dysphagia 3 diet with thin liquids was recommended at that time.  Type of Study: Bedside Swallow Evaluation Previous Swallow Assessment: MBS in 2020; see HPI Temperature Spikes Noted: No Respiratory Status: Room air History of Recent Intubation: No Behavior/Cognition: Alert Oral Cavity Assessment: Within Functional Limits Oral Care Completed by SLP: No Oral Cavity - Dentition: Edentulous Vision: Functional for self-feeding Self-Feeding Abilities: Total assist Patient Positioning: Upright in bed;Postural control adequate for testing Baseline Vocal Quality: Normal Volitional Cough: Cognitively unable to elicit Volitional Swallow: Unable to elicit    Oral/Motor/Sensory Function Overall Oral Motor/Sensory Function: Other (comment)(Pt unable to follow commands for assessment)   Ice Chips Ice chips: Not tested   Thin Liquid Thin Liquid: Within functional limits Presentation: Straw    Nectar Thick Nectar Thick Liquid: Not tested   Honey Thick Honey Thick Liquid: Not tested   Puree Puree: Within functional limits Presentation: Spoon   Solid     Solid: Impaired Presentation: Spoon Oral Phase Impairments: Impaired mastication Oral Phase Functional Implications: Impaired mastication;Prolonged oral transit(Pt ultimately spat out bolus)     [name] Negus, [place], [place] Office number [phone] Pager [place] 12/06/2019,5:34 PM

## 2019-12-06 NOTE — Consult Note (Signed)
Consultation Note Date: 12/06/2019   Patient Name: [name]  DOB: 1921/10/18  MRN: [id number]  Age / Sex: 86 y.o., male  PCP: Gayland Curry, DO Referring Physician: Hosie Poisson, MD  Reason for Consultation: Establishing goals of care  HPI/Patient Profile: 86 y.o. male  with past medical history of advanced dementia, HTN, HLD, T2DM, and recent CVA (Dec 2020) w/ residual deficits admitted on 12/05/2019 with sacral pressure ulcer. Cultures from wound reveal E. Coli. PMT consulted for [place].    Clinical Assessment and Goals of Care: I have reviewed medical records including EPIC notes, labs and imaging, received report from RN, assessed the patient and then met with patient's daughter [name]  to discuss diagnosis prognosis, GOC, EOL wishes, disposition and options.  I introduced Palliative Medicine as specialized medical care for people living with serious illness. It focuses on providing relief from the symptoms and stress of a serious illness. The goal is to improve quality of life for both the patient and the family.  We discussed a brief life review of the patient. 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 Mr. Cabiness lives with [name] now.   As far as functional and nutritional status, [name] confirms that Mr. Whitmore is bed bound. She tells me he has a great appetite. She tells me he is mostly non-communicative but does have moments of clarity.    During my visit, Mr. Bordner yells out intermittently. I also notice facial grimacing. I bring this to [facility] attention and we discuss the possibility that Mr. Canupp is in pain. [name] disagrees. She tells me he yells out at home some as well and she does not believe this is from pain.    We discussed patient's current illness and what it means in the larger context of patient's on-going co-morbidities.  We discuss his dementia and dementia disease process.   I attempted to elicit values and  goals of care important to the patient.  [name] shares Mr. Bernstein has told her in the past that he wants all medical efforts to prolong his life - to her this means continued full scope care, full code status, PEG tube if indicated.   Discussed with [name] the importance of continued conversation with family and the medical providers regarding overall plan of care and treatment options, ensuring decisions are within the context of the patient's values and GOCs.    [name] is interested in taking Mr. McDonald home with home health.   Questions and concerns were addressed. The family was encouraged to call with questions or concerns.   Primary Decision Maker NEXT OF KIN - daughter Drayke Grabel    SUMMARY OF RECOMMENDATIONS   - full code/full scope - PMT will shadow  Code Status/Advance Care Planning:  Full code  Prognosis:   < 6 months  Discharge Planning: Home with Home Health      Primary Diagnoses: Present on Admission: . Osteomyelitis ([place])   I have reviewed the medical record, interviewed the patient and family, and examined the patient. The following aspects are pertinent.  Past Medical History:  Diagnosis Date  . Arthritis   . CAD (coronary artery disease)    s/p DES to LAD 06/2013  . Dementia with behavioral disturbance ([place])   . Depression   . Dyslipidemia   . Glaucoma   . Hyperglycemia 04/17/2014  . Hyperlipidemia   . Hypertension   . Stroke ([place])   . Type 2 diabetes mellitus ([place])  Social History   Socioeconomic History  . Marital status: Widowed    Spouse name: Not on file  . Number of children: 4  . Years of education: Not on file  . Highest education level: Not on file  Occupational History  . Occupation: Retired  Tobacco Use  . Smoking status: Former Smoker    Types: Cigarettes    Quit date: 08/02/1968    Years since quitting: 51.3  . Smokeless tobacco: Never Used  Substance and Sexual Activity  . Alcohol use: No    Alcohol/week: 0.0 standard  drinks  . Drug use: No  . Sexual activity: Not Currently  Other Topics Concern  . Not on file  Social History Narrative   Retired Warden/ranger   widowed, lives with dtr and youngest son   5 kids from first marriage   Social Determinants of Health   Financial Resource Strain:   . Difficulty of Paying Living Expenses:   Food Insecurity: No Food Insecurity  . Worried About Charity fundraiser in the Last Year: Never true  . Ran Out of Food in the Last Year: Never true  Transportation Needs: No Transportation Needs  . Lack of Transportation (Medical): No  . Lack of Transportation (Non-Medical): No  Physical Activity:   . Days of Exercise per Week:   . Minutes of Exercise per Session:   Stress:   . Feeling of Stress :   Social Connections:   . Frequency of Communication with Friends and Family:   . Frequency of Social Gatherings with Friends and Family:   . Attends Religious Services:   . Active Member of Clubs or Organizations:   . Attends Archivist Meetings:   [name] Marital Status:    Family History  Problem Relation Age of Onset  . Arthritis Mother   . Breast cancer Mother   . Arthritis Father   . Dementia Father   . Diabetes Other   . Arthritis Other   . Colon cancer Other   . Stroke Other   . Diabetes type II Sister   . Suicidality Brother   . Diabetes type I Brother   . Diabetes type I Brother   . Diabetes type I Brother   . Diabetes type II Sister   . Breast cancer Daughter   . Emphysema Daughter    Scheduled Meds: . amLODipine  5 mg Oral Daily  . apixaban  5 mg Oral BID  . aspirin EC  81 mg Oral Daily  . donepezil  10 mg Oral QHS  . escitalopram  5 mg Oral Daily  . feeding supplement (ENSURE ENLIVE)  237 mL Oral BID BM  . hydrochlorothiazide  25 mg Oral Daily  . insulin aspart  0-9 Units Subcutaneous TID WC  . loratadine  10 mg Oral Daily  . losartan  50 mg Oral Daily  . memantine  28 mg Oral Daily  . multivitamin with minerals  1 tablet  Oral Daily  . simvastatin  20 mg Oral Daily  . tamsulosin  0.4 mg Oral QPC supper   Continuous Infusions: . cefTRIAXone (ROCEPHIN)  IV Stopped (12/05/19 2300)   PRN Meds:.acetaminophen **OR** acetaminophen, albuterol, ondansetron **OR** ondansetron (ZOFRAN) IV Allergies  Allergen Reactions  . Lisinopril Cough   Review of Systems  Unable to perform ROS: Dementia    Physical Exam Constitutional:      Comments: Yells out intermittently, no intelligible speech, mostly sleeping  Pulmonary:     Effort: Pulmonary effort  is normal. No respiratory distress.  Skin:    General: Skin is warm and dry.  Neurological:     Mental Status: He is disoriented.     Vital Signs: BP (!) 112/56 (BP Location: Left Arm)   Pulse 99   Temp 98.1 F (36.7 C) (Oral)   Resp 18   Wt 79.4 kg   SpO2 91%   BMI 23.73 kg/m  Pain Scale: PAINAD   Pain Score: 0-No pain   SpO2: SpO2: 91 % O2 Device:SpO2: 91 % O2 Flow Rate: .   IO: Intake/output summary:   Intake/Output Summary (Last 24 hours) at 12/06/2019 1650 Last data filed at 12/06/2019 1120 Gross per 24 hour  Intake 0 ml  Output 2450 ml  Net -2450 ml    LBM: Last BM Date: 12/06/19 Baseline Weight: Weight: 79.4 kg Most recent weight: Weight: 79.4 kg     Palliative Assessment/Data: PPS 30%    Time Total: 50 minutes Greater than 50%  of this time was spent counseling and coordinating care related to the above assessment and plan.  Juel Burrow, DNP, AGNP-C Palliative Medicine Team [phone] Pager: [phone]

## 2019-12-06 NOTE — Progress Notes (Signed)
Initial Nutrition Assessment  DOCUMENTATION CODES:   Not applicable  INTERVENTION:  Provide Ensure Enlive po BID, each supplement provides 350 kcal and 20 grams of protein  Encourage adequate PO intake.   NUTRITION DIAGNOSIS:   Increased nutrient needs related to wound healing as evidenced by estimated needs.  GOAL:   Patient will meet greater than or equal to 90% of their needs  MONITOR:   PO intake, Supplement acceptance, Skin, Weight trends, Labs, I & O's  REASON FOR ASSESSMENT:   Malnutrition Screening Tool    ASSESSMENT:   78 old male with prior history of advanced dementia, hypertension, hyperlipidemia, type 2 diabetes mellitus, recent CVA in December 2020 with residual deficits, chronic ambulation dysfunction leading to bedbound since January 2021 with chronic sacral pressure ulcer presenting with worsening of the sacral wound.  Pt confused during time of visit. MD aware. RD unable to obtain recent nutrition history. Pt is currently on a soft diet with thin liquids. Pt with no significant weight loss per weight records. RD to order nutritional supplements to aid in caloric and protein needs as well as in wound healing.   NUTRITION - FOCUSED PHYSICAL EXAM:    Most Recent Value  Orbital Region  Unable to assess  Upper Arm Region  Unable to assess  Thoracic and Lumbar Region  Unable to assess  Buccal Region  Unable to assess  Temple Region  Unable to assess  Clavicle Bone Region  Unable to assess  Clavicle and Acromion Bone Region  Unable to assess  Scapular Bone Region  Unable to assess  Dorsal Hand  Unable to assess  Patellar Region  Unable to assess  Anterior Thigh Region  Unable to assess  Posterior Calf Region  Unable to assess  Edema (RD Assessment)  Unable to assess  Hair  Unable to assess  Eyes  Unable to assess  Mouth  Unable to assess  Skin  Unable to assess  Nails  Unable to assess     Labs and medications reviewed.   Diet Order:   Diet Order             DIET SOFT Room service appropriate? No; Fluid consistency: Thin  Diet effective now              EDUCATION NEEDS:   Not appropriate for education at this time  Skin:  Skin Assessment: Skin Integrity Issues: Skin Integrity Issues:: Stage IV Stage IV: sacral ulcer  Last BM:  5/6  Height:   Ht Readings from Last 1 Encounters:  10/12/19 6' (1.829 m)    Weight:   Wt Readings from Last 1 Encounters:  12/05/19 79.4 kg    BMI:  Body mass index is 23.73 kg/m.  Estimated Nutritional Needs:   Kcal:  1850-2050  Protein:  85-100 grams  Fluid:  >/= 1.8 L/day   Corrin Parker, MS, RD, LDN RD pager number/after hours weekend pager number on Am[facility].

## 2019-12-06 NOTE — Progress Notes (Signed)
PROGRESS NOTE    [name]  [id number] DOB: 1921-09-10 DOA: 12/05/2019 PCP: Gayland Curry, DO   Chief Complaint  Patient presents with  . Wound Check    Brief Narrative:  21 old male gentleman prior history of advanced dementia, hypertension, hyperlipidemia, type 2 diabetes mellitus, recent CVA in December 2 0 20 with residual deficits, chronic ambulation dysfunction leading to bedbound since August 03, 2000 1 with chronic sacral pressure ulcer presents to ED for worsening of the sacral wound.  Patient was seen by wound care and recommendations given.  Cultures from the wound from 11/30/2019 show E. coli sensitive to all the antibiotics.  Assessment & Plan:   Active Problems:   Pressure injury of sacral region, stage 4 (HCC)   Stage IV pressure injury of the sacral area Wound care consulted and recommendations given and he was started on IV Rocephin.  Cultures from the sacral wound were growing E. coli sensitive to ampicillin and cephalosporins. Plan to transition to oral antibiotics tomorrow and possible discharge home with home PT and RN.    Essential hypertension Blood pressure parameters well controlled and stable    Advanced dementia Resume home medications.   Diabetes mellitus type 2  CBG (last 3)  Recent Labs    12/05/19 2158 12/06/19 0736 12/06/19 1135  GLUCAP 211* 149* 133*   Continue with sliding scale insulin.   Recent stroke in December 2020 with residual weakness with poor functional status leading to bedbound since August 03, 2000 1 Continue with aspirin.    Hyperlipidemia Continue with Zocor.    DVT prophylaxis: Eliquis Code Status: Full code Family Communication: Discussed with daughter over the phone Disposition:   Status is: Inpatient  Remains inpatient appropriate because:IV treatments appropriate due to intensity of illness or inability to take PO   Dispo: The patient is from: Home              Anticipated d/c is to:  Home              Anticipated d/c date is: 1 day              Patient currently is not medically stable to d/c.        Consultants:   Wound care   Procedures: None  Antimicrobials: Rocephin since admission  Subjective: Patient is confused but appears comfortable and not in any kind of distress.  Objective: Vitals:   12/05/19 2238 12/06/19 0337 12/06/19 0734 12/06/19 1134  BP: (!) 137/58 (!) 120/58 (!) 116/56 (!) 110/58  Pulse:  94 95 99  Resp: 16  18 18   Temp: 99.2 F (37.3 C) 98 F (36.7 C) 98.9 F (37.2 C) 98.4 F (36.9 C)  TempSrc: Oral Oral Oral Oral  SpO2: 96% 97% 91% 93%  Weight:        Intake/Output Summary (Last 24 hours) at 12/06/2019 1336 Last data filed at 12/06/2019 1120 Gross per 24 hour  Intake 0 ml  Output 2450 ml  Net -2450 ml   Filed Weights   12/05/19 1100  Weight: 79.4 kg    Examination:  General exam: Appears calm and comfortable  Respiratory system: Clear to auscultation. Respiratory effort normal. Cardiovascular system: S1 & S2 heard, RRR. No JVD, . No pedal edema. Gastrointestinal system: Abdomen is nondistended, soft and nontender. Normal bowel sounds heard. Central nervous system: Alert , pleasantly confused Extremities: No pedal edema, cyanosis Skin: Stage IV sacral pressure injury present Psychiatry: CANNOT be assessed.  Data Reviewed: I have personally reviewed following labs and imaging studies  CBC: Recent Labs  Lab 12/05/19 0950  WBC 10.8*  NEUTROABS 6.6  HGB 7.3*  HCT 22.8*  MCV 92.7  PLT 493*    Basic Metabolic Panel: Recent Labs  Lab 12/05/19 1006  NA 134*  K 4.8  CL 101  CO2 26  GLUCOSE 180*  BUN 32*  CREATININE 1.03  CALCIUM 9.1    GFR: Estimated Creatinine Clearance: 47.1 mL/min (by C-G formula based on SCr of 1.03 mg/dL).  Liver Function Tests: Recent Labs  Lab 12/05/19 1006  AST 29  ALT 28  ALKPHOS 66  BILITOT 0.2*  PROT 6.2*  ALBUMIN 2.4*    CBG: Recent Labs  Lab  12/05/19 1824 12/05/19 2158 12/06/19 0736 12/06/19 1135  GLUCAP 166* 211* 149* 133*     Recent Results (from the past 240 hour(s))  Aerobic/Anaerobic Culture (surgical/deep wound)     Status: None   Collection Time: 11/30/19  2:15 PM   Specimen: Wound  Result Value Ref Range Status   Specimen Description   Final    WOUND Performed at [facility] [address] [place], [place] [address]    Special Requests   Final    SACRAL Performed at [facility], [place] [address], [place], [place] [address]    Gram Stain   Final    FEW WBC PRESENT, PREDOMINANTLY PMN ABUNDANT GRAM POSITIVE COCCI ABUNDANT GRAM NEGATIVE RODS ABUNDANT GRAM POSITIVE RODS    Culture   Final    MODERATE ENTEROCOCCUS AVIUM FEW ESCHERICHIA COLI UNABLE TO PERFORM SUSCEPTIBILITY ON ENTEROCOCCUS AVIUM MODERATE BACTEROIDES THETAIOTAOMICRON BETA LACTAMASE POSITIVE Performed at [facility], [place] [address], [place], [place] [address]    Report Status 12/06/2019 FINAL  Final   Organism ID, Bacteria ESCHERICHIA COLI  Final      Susceptibility   Escherichia coli - MIC*    AMPICILLIN <=2 SENSITIVE Sensitive     CEFAZOLIN <=4 SENSITIVE Sensitive     CEFEPIME <=1 SENSITIVE Sensitive     CEFTAZIDIME <=1 SENSITIVE Sensitive     CEFTRIAXONE <=1 SENSITIVE Sensitive     CIPROFLOXACIN <=0.25 SENSITIVE Sensitive     GENTAMICIN <=1 SENSITIVE Sensitive     IMIPENEM <=0.25 SENSITIVE Sensitive     TRIMETH/SULFA <=20 SENSITIVE Sensitive     AMPICILLIN/SULBACTAM <=2 SENSITIVE Sensitive     PIP/TAZO <=4 SENSITIVE Sensitive     * FEW ESCHERICHIA COLI  Blood culture (routine x 2)     Status: None (Preliminary result)   Collection Time: 12/05/19 11:25 AM   Specimen: BLOOD LEFT HAND  Result Value Ref Range Status   Specimen Description BLOOD LEFT HAND  Final   Special Requests   Final    BOTTLES DRAWN AEROBIC AND ANAEROBIC Blood Culture results may not be optimal due to an  inadequate volume of blood received in culture bottles   Culture   Final    NO GROWTH < 24 HOURS Performed at [facility], [address] [place],  [address]    Report Status PENDING  Incomplete  Blood culture (routine x 2)     Status: None (Preliminary result)   Collection Time: 12/05/19 12:09 PM   Specimen: BLOOD LEFT HAND  Result Value Ref Range Status   Specimen Description BLOOD LEFT HAND  Final   Special Requests   Final    BOTTLES DRAWN AEROBIC AND ANAEROBIC Blood Culture adequate volume  Culture   Final    NO GROWTH < 24 HOURS Performed at [facility], [place] [address], [place], [place] [address]    Report Status PENDING  Incomplete  SARS CORONAVIRUS 2 (TAT 6-24 HRS) Nasopharyngeal Nasopharyngeal Swab     Status: None   Collection Time: 12/05/19  9:16 PM   Specimen: Nasopharyngeal Swab  Result Value Ref Range Status   SARS Coronavirus 2 NEGATIVE NEGATIVE Final    Comment: (NOTE) SARS-CoV-2 target nucleic acids are NOT DETECTED. The SARS-CoV-2 RNA is generally detectable in upper and lower respiratory specimens during the acute phase of infection. Negative results do not preclude SARS-CoV-2 infection, do not rule out co-infections with other pathogens, and should not be used as the sole basis for treatment or other patient management decisions. Negative results must be combined with clinical observations, patient history, and epidemiological information. The expected result is Negative. Fact Sheet for Patients: SugarRoll.be Fact Sheet for Healthcare Providers: https://www.woods-mathews.com/ This test is not yet approved or cleared by the [place] FDA and  has been authorized for detection and/or diagnosis of SARS-CoV-2 by FDA under an Emergency Use Authorization (EUA). This EUA will remain  in effect (meaning this test can be used) for the duration of the COVID-19 declaration under Section 56 4(b)(1) of  the Act, 21 U.S.C. section 360bbb-3(b)(1), unless the authorization is terminated or revoked sooner. Performed at [facility], [place] [address] [place], [place] [address]          Radiology Studies: MR LUMBAR SPINE WO CONTRAST  Result Date: 12/05/2019 CLINICAL DATA:  Low back pain. Sacral decubitus ulcer with concern for osteomyelitis. EXAM: MRI LUMBAR SPINE WITHOUT CONTRAST TECHNIQUE: Multiplanar, multisequence MR imaging of the lumbar spine was performed. No intravenous contrast was administered. COMPARISON:  Lumbar spine radiographs 04/23/2009 FINDINGS: Segmentation: Transitional lumbosacral anatomy with partial sacralization of L5. Rudimentary L5-S1 disc. Alignment: Trace retrolisthesis of L2 on L3 and L4 on L5. mild lumbar dextroscoliosis. Vertebrae: No fracture, suspicious osseous lesion, or significant marrow edema to indicate acute osteomyelitis. Primarily Modic type 2 degenerative endplate changes at 075-GRM asymmetric to the right. Conus medullaris and cauda equina: Conus extends to the upper L1 level. Conus and cauda equina appear normal. Paraspinal and other soft tissues: T2 hyperintense lesions in both kidneys compatible with cysts including a 3.1 cm cyst in the interpolar left kidney and a 3.2 cm exophytic cyst in the upper pole of the right kidney with a small amount of layering blood products. Symmetric bilateral posterior paraspinal muscle edema primarily in the lower lumbar spine. Disc levels: Disc desiccation from L1-2 to L4-5. T12-L1: Negative. L1-2: Minimal disc bulging and mild facet hypertrophy without stenosis. L2-3: Disc bulging, a left foraminal disc protrusion with annular fissure, and mild facet hypertrophy result in mild left greater than right lateral recess stenosis and mild left greater than right neural foraminal stenosis without spinal stenosis. L3-4: Disc bulging and moderate facet hypertrophy result in mild-to-moderate left greater than right lateral recess  stenosis and moderate bilateral neural foraminal stenosis without spinal stenosis. L4-5: Disc bulging and mild facet hypertrophy result in mild bilateral lateral recess stenosis and moderate right greater than left neural foraminal stenosis without spinal stenosis. L5-S1: Only imaged sagittally. Transitional anatomy with rudimentary disc. No stenosis. IMPRESSION: 1. Transitional lumbosacral anatomy. 2. No evidence of discitis/osteomyelitis. 3. Nonspecific bilateral paraspinal muscle edema. 4. Focally advanced disc degeneration at L4-5 with moderate neural foraminal stenosis. 5. Mild-to-moderate lateral recess and moderate neural foraminal stenosis  at L3-4. Electronically Signed   By: Logan Bores M.D.   On: 12/05/2019 14:34        Scheduled Meds: . amLODipine  5 mg Oral Daily  . apixaban  5 mg Oral BID  . aspirin EC  81 mg Oral Daily  . donepezil  10 mg Oral QHS  . escitalopram  5 mg Oral Daily  . hydrochlorothiazide  25 mg Oral Daily  . insulin aspart  0-9 Units Subcutaneous TID WC  . loratadine  10 mg Oral Daily  . losartan  50 mg Oral Daily  . memantine  28 mg Oral Daily  . multivitamin with minerals  1 tablet Oral Daily  . simvastatin  20 mg Oral Daily  . tamsulosin  0.4 mg Oral QPC supper   Continuous Infusions: . cefTRIAXone (ROCEPHIN)  IV Stopped (12/05/19 2300)     LOS: 1 day       Hosie Poisson, MD [facility]   To contact the attending provider between 7A-7P or the covering provider during after hours 7P-7A, please log into the web site [email or web address] and access using universal [place] password for that web site. If you do not have the password, please call the hospital operator.  12/06/2019, 1:36 PM

## 2019-12-07 ENCOUNTER — Encounter (HOSPITAL_BASED_OUTPATIENT_CLINIC_OR_DEPARTMENT_OTHER): Payer: Medicare HMO | Admitting: Internal Medicine

## 2019-12-07 DIAGNOSIS — F03C0 Unspecified dementia, severe, without behavioral disturbance, psychotic disturbance, mood disturbance, and anxiety: Secondary | ICD-10-CM

## 2019-12-07 DIAGNOSIS — Z515 Encounter for palliative care: Secondary | ICD-10-CM

## 2019-12-07 DIAGNOSIS — D649 Anemia, unspecified: Secondary | ICD-10-CM

## 2019-12-07 DIAGNOSIS — Z7189 Other specified counseling: Secondary | ICD-10-CM

## 2019-12-07 DIAGNOSIS — F0390 Unspecified dementia without behavioral disturbance: Secondary | ICD-10-CM

## 2019-12-07 LAB — TYPE AND SCREEN
ABO/RH(D): AB POS
Antibody Screen: NEGATIVE
Unit division: 0

## 2019-12-07 LAB — CBC
HCT: 25.6 % — ABNORMAL LOW (ref 39.0–52.0)
Hemoglobin: 8.1 g/dL — ABNORMAL LOW (ref 13.0–17.0)
MCH: 28.8 pg (ref 26.0–34.0)
MCHC: 31.6 g/dL (ref 30.0–36.0)
MCV: 91.1 fL (ref 80.0–100.0)
Platelets: 480 10*3/uL — ABNORMAL HIGH (ref 150–400)
RBC: 2.81 MIL/uL — ABNORMAL LOW (ref 4.22–5.81)
RDW: 14.2 % (ref 11.5–15.5)
WBC: 11.9 10*3/uL — ABNORMAL HIGH (ref 4.0–10.5)
nRBC: 0 % (ref 0.0–0.2)

## 2019-12-07 LAB — GLUCOSE, CAPILLARY
Glucose-Capillary: 156 mg/dL — ABNORMAL HIGH (ref 70–99)
Glucose-Capillary: 272 mg/dL — ABNORMAL HIGH (ref 70–99)
Glucose-Capillary: 251 mg/dL — ABNORMAL HIGH (ref 70–99)

## 2019-12-07 LAB — BPAM RBC
Blood Product Expiration Date: 202106042359
ISSUE DATE / TIME: 202105062037
Unit Type and Rh: 8400

## 2019-12-07 LAB — BASIC METABOLIC PANEL
Anion gap: 9 (ref 5–15)
BUN: 27 mg/dL — ABNORMAL HIGH (ref 8–23)
CO2: 26 mmol/L (ref 22–32)
Calcium: 9.1 mg/dL (ref 8.9–10.3)
Chloride: 104 mmol/L (ref 98–111)
Creatinine, Ser: 1.15 mg/dL (ref 0.61–1.24)
GFR calc Af Amer: >60 mL/min (ref >60)
GFR calc non Af Amer: 54 mL/min — ABNORMAL LOW (ref >60)
Glucose, Bld: 180 mg/dL — ABNORMAL HIGH (ref 70–99)
Potassium: 5 mmol/L (ref 3.5–5.1)
Sodium: 139 mmol/L (ref 135–145)

## 2019-12-07 MED ORDER — ENSURE ENLIVE PO LIQD: 237.0000 mL | Freq: Two times a day (BID) | ORAL | 12 refills | Status: AC

## 2019-12-07 MED ORDER — AMOXICILLIN-POT CLAVULANATE 875-125 MG PO TABS: 1.0000 | ORAL_TABLET | Freq: Two times a day (BID) | ORAL | 0 refills | Status: AC

## 2019-12-07 NOTE — Discharge Summary (Signed)
Physician Discharge Summary  [name] [id number] DOB: Jan 31, 1922 DOA: 12/05/2019  PCP: [name] Curry, DO  Admit date: 12/05/2019 Discharge date: 12/07/2019  Admitted From: Home.  Disposition: HOME.  Recommendations for Outpatient Follow-up:  1. Follow up with PCP in 1-2 weeks 2. Please obtain BMP/CBC in one week 3. Please follow up with wound care as recommended.   Home Health:YES   Discharge Condition:stable.  CODE STATUS:full code.  Diet recommendation: Heart Healthy    Brief/Interim Summary:   Discharge Diagnoses:  Active Problems:   Pressure injury of sacral region, stage 4 (HCC)   Osteomyelitis (HCC)   Advanced dementia (HCC)   Goals of care, counseling/discussion   Palliative care by specialist  Stage IV pressure injury of the sacral area MRI negative for osteomyelitis.  Wound care consulted and recommendations given and he was started on IV Rocephin.  Cultures from the sacral wound were growing E. coli sensitive to ampicillin and cephalosporins. Transition to oral antibiotics tomorrow and possible discharge home with home PT and RN.    Essential hypertension Blood pressure parameters well controlled and stable    Advanced dementia Resume home medications.   Diabetes mellitus type 2 CBG (last 3)  Recent Labs    12/06/19 2235 12/07/19 0739 12/07/19 1149  GLUCAP 203* 156* 272*   Resume home meds. A1c is 7.1   Recent stroke in December 2020 with residual weakness with poor functional status leading to bedbound since August 03, 2000 1 Continue with aspirin.    Hyperlipidemia Continue with Zocor.     Discharge Instructions  Discharge Instructions    Diet - low sodium heart healthy   Complete by: As directed    Discharge instructions   Complete by: As directed    Please follow up with wound care as recommended every weekly. We have held the losartan for high potassium level. Recheck labs in one week and resume losartan as  per PCP.     Allergies as of 12/07/2019      Reactions   Lisinopril Cough      Medication List    STOP taking these medications   losartan 50 MG tablet Commonly known as: COZAAR     TAKE these medications   Accu-Chek Aviva device Use to test blood sugar three times daily. Dx E11.51   accu-chek softclix lancets Use as instructed with checking blood sugar three times daily. Dx: E11.51   acetaminophen 500 MG tablet Commonly known as: TYLENOL Take 500 mg by mouth every 6 (six) hours as needed for moderate pain.   albuterol 108 (90 Base) MCG/ACT inhaler Commonly known as: VENTOLIN HFA Inhale 2 puffs into the lungs every 4 (four) hours as needed for wheezing or shortness of breath. INHALE TWO PUFFS EVERY 4-6 HOURS AS NEEDED FOR DIFFICULTY BREATHING,WHEEZING OR COUGH What changed: additional instructions   amLODipine 5 MG tablet Commonly known as: NORVASC Take 1 tablet (5 mg total) by mouth daily.   amoxicillin-clavulanate 875-125 MG tablet Commonly known as: AUGMENTIN Take 1 tablet by mouth 2 (two) times daily for 7 days. Start date : 11/30/19   apixaban 5 MG Tabs tablet Commonly known as: Eliquis Take 1 tablet (5 mg total) by mouth 2 (two) times daily.   aspirin EC 81 MG tablet Take 1 tablet (81 mg total) by mouth daily.   escitalopram 5 MG tablet Commonly known as: LEXAPRO TAKE 1 TABLET EVERY DAY   feeding supplement (ENSURE ENLIVE) Liqd Take 237 mLs by mouth 2 (two) times daily  between meals.   glucose blood test strip Commonly known as: Accu-Chek Aviva Use to test blood sugar three times daily. Dx: E11.51   hydrochlorothiazide 25 MG tablet Commonly known as: HYDRODIURIL TAKE 1 TABLET EVERY DAY   insulin glargine 100 UNIT/ML injection Commonly known as: LANTUS Inject 8 Units into the skin daily.   Insulin Pen Needle 31G X 5 MM Misc Commonly known as: B-D UF III MINI PEN NEEDLES USE AS DIRECTED.   Loratadine 10 MG Caps Take 1 capsule (10 mg total) by  mouth daily.   Namzaric 28-10 MG Cp24 Generic drug: Memantine HCl-Donepezil HCl TAKE 1 CAPSULE EVERY DAY   Sentry Senior Tabs Take 1 tablet by mouth daily.   simvastatin 20 MG tablet Commonly known as: ZOCOR Take 1 tablet (20 mg total) by mouth daily.   Sure-Prep Alcohol Prep 70 % Pads 1 each by Does not apply route as needed.   tamsulosin 0.4 MG Caps capsule Commonly known as: FLOMAX Take 1 capsule (0.4 mg total) by mouth daily after supper.      Follow-up Information    Reed, Tiffany L, DO. Schedule an appointment as soon as possible for a visit in 1 week(s).   Specialty: Geriatric Medicine Contact information: [place]. [place] [address] [phone]        [facility] Follow up.   Specialty: [place] Why: HHRN/PT/OT/Social Work- they will contact you to resume services on discharge Contact information: [address]  [place] [phone]          Allergies  Allergen Reactions  . Lisinopril Cough    Consultations:  PALLIATIVE CARE     Procedures/Studies: MR LUMBAR SPINE WO CONTRAST  Result Date: 12/05/2019 CLINICAL DATA:  Low back pain. Sacral decubitus ulcer with concern for osteomyelitis. EXAM: MRI LUMBAR SPINE WITHOUT CONTRAST TECHNIQUE: Multiplanar, multisequence MR imaging of the lumbar spine was performed. No intravenous contrast was administered. COMPARISON:  Lumbar spine radiographs 04/23/2009 FINDINGS: Segmentation: Transitional lumbosacral anatomy with partial sacralization of L5. Rudimentary L5-S1 disc. Alignment: Trace retrolisthesis of L2 on L3 and L4 on L5. mild lumbar dextroscoliosis. Vertebrae: No fracture, suspicious osseous lesion, or significant marrow edema to indicate acute osteomyelitis. Primarily Modic type 2 degenerative endplate changes at 075-GRM asymmetric to the right. Conus medullaris and cauda equina: Conus extends to the upper L1 level. Conus and cauda equina appear normal.  Paraspinal and other soft tissues: T2 hyperintense lesions in both kidneys compatible with cysts including a 3.1 cm cyst in the interpolar left kidney and a 3.2 cm exophytic cyst in the upper pole of the right kidney with a small amount of layering blood products. Symmetric bilateral posterior paraspinal muscle edema primarily in the lower lumbar spine. Disc levels: Disc desiccation from L1-2 to L4-5. T12-L1: Negative. L1-2: Minimal disc bulging and mild facet hypertrophy without stenosis. L2-3: Disc bulging, a left foraminal disc protrusion with annular fissure, and mild facet hypertrophy result in mild left greater than right lateral recess stenosis and mild left greater than right neural foraminal stenosis without spinal stenosis. L3-4: Disc bulging and moderate facet hypertrophy result in mild-to-moderate left greater than right lateral recess stenosis and moderate bilateral neural foraminal stenosis without spinal stenosis. L4-5: Disc bulging and mild facet hypertrophy result in mild bilateral lateral recess stenosis and moderate right greater than left neural foraminal stenosis without spinal stenosis. L5-S1: Only imaged sagittally. Transitional anatomy with rudimentary disc. No stenosis. IMPRESSION: 1. Transitional lumbosacral anatomy. 2. No evidence of  discitis/osteomyelitis. 3. Nonspecific bilateral paraspinal muscle edema. 4. Focally advanced disc degeneration at L4-5 with moderate neural foraminal stenosis. 5. Mild-to-moderate lateral recess and moderate neural foraminal stenosis at L3-4. Electronically Signed   By: Logan Bores M.D.   On: 12/05/2019 14:34       Subjective:  NO NEW COMPLAINTS.  Discharge Exam: Vitals:   12/07/19 0740 12/07/19 1152  BP: 132/77 132/66  Pulse: (!) 101 (!) 56  Resp: 14 14  Temp: 98.9 F (37.2 C) 99.7 F (37.6 C)  SpO2: 96% (!) 75%   Vitals:   12/07/19 0043 12/07/19 0600 12/07/19 0740 12/07/19 1152  BP: 124/61 118/65 132/77 132/66  Pulse: 96 99 (!) 101  (!) 56  Resp: 16 16 14 14   Temp: 99.5 F (37.5 C) 99 F (37.2 C) 98.9 F (37.2 C) 99.7 F (37.6 C)  TempSrc: Axillary Axillary Axillary Axillary  SpO2: 99% 98% 96% (!) 75%  Weight:        General: Pt is alert, awake, not in acute distress Cardiovascular: RRR, S1/S2 +, no rubs, no gallops Respiratory: CTA bilaterally, no wheezing, no rhonchi Abdominal: Soft, NT, ND, bowel sounds + Extremities: no edema, no cyanosis    The results of significant diagnostics from this hospitalization (including imaging, microbiology, ancillary and laboratory) are listed below for reference.     Microbiology: Recent Results (from the past 240 hour(s))  Aerobic/Anaerobic Culture (surgical/deep wound)     Status: None   Collection Time: 11/30/19  2:15 PM   Specimen: Wound  Result Value Ref Range Status   Specimen Description   Final    WOUND Performed at [facility] [address] [place], [place] [address]    Special Requests   Final    SACRAL Performed at [facility], [place] [address], [place], [place] [address]    Gram Stain   Final    FEW WBC PRESENT, PREDOMINANTLY PMN ABUNDANT GRAM POSITIVE COCCI ABUNDANT GRAM NEGATIVE RODS ABUNDANT GRAM POSITIVE RODS    Culture   Final    MODERATE ENTEROCOCCUS AVIUM FEW ESCHERICHIA COLI UNABLE TO PERFORM SUSCEPTIBILITY ON ENTEROCOCCUS AVIUM MODERATE BACTEROIDES THETAIOTAOMICRON BETA LACTAMASE POSITIVE Performed at [facility], [place] [address], [place], [place] [address]    Report Status 12/06/2019 FINAL  Final   Organism ID, Bacteria ESCHERICHIA COLI  Final      Susceptibility   Escherichia coli - MIC*    AMPICILLIN <=2 SENSITIVE Sensitive     CEFAZOLIN <=4 SENSITIVE Sensitive     CEFEPIME <=1 SENSITIVE Sensitive     CEFTAZIDIME <=1 SENSITIVE Sensitive     CEFTRIAXONE <=1 SENSITIVE Sensitive     CIPROFLOXACIN <=0.25 SENSITIVE Sensitive     GENTAMICIN <=1 SENSITIVE Sensitive     IMIPENEM <=0.25  SENSITIVE Sensitive     TRIMETH/SULFA <=20 SENSITIVE Sensitive     AMPICILLIN/SULBACTAM <=2 SENSITIVE Sensitive     PIP/TAZO <=4 SENSITIVE Sensitive     * FEW ESCHERICHIA COLI  Blood culture (routine x 2)     Status: None (Preliminary result)   Collection Time: 12/05/19 11:25 AM   Specimen: BLOOD LEFT HAND  Result Value Ref Range Status   Specimen Description BLOOD LEFT HAND  Final   Special Requests   Final    BOTTLES DRAWN AEROBIC AND ANAEROBIC Blood Culture results may not be optimal due to an inadequate volume of blood received in culture bottles   Culture   Final    NO GROWTH 2 DAYS  Performed at [facility], [place] [address] [place], [place] [address]    Report Status PENDING  Incomplete  Blood culture (routine x 2)     Status: None (Preliminary result)   Collection Time: 12/05/19 12:09 PM   Specimen: BLOOD LEFT HAND  Result Value Ref Range Status   Specimen Description BLOOD LEFT HAND  Final   Special Requests   Final    BOTTLES DRAWN AEROBIC AND ANAEROBIC Blood Culture adequate volume   Culture   Final    NO GROWTH 2 DAYS Performed at [facility], [place] [address], [place], [place] [address]    Report Status PENDING  Incomplete  SARS CORONAVIRUS 2 (TAT 6-24 HRS) Nasopharyngeal Nasopharyngeal Swab     Status: None   Collection Time: 12/05/19  9:16 PM   Specimen: Nasopharyngeal Swab  Result Value Ref Range Status   SARS Coronavirus 2 NEGATIVE NEGATIVE Final    Comment: (NOTE) SARS-CoV-2 target nucleic acids are NOT DETECTED. The SARS-CoV-2 RNA is generally detectable in upper and lower respiratory specimens during the acute phase of infection. Negative results do not preclude SARS-CoV-2 infection, do not rule out co-infections with other pathogens, and should not be used as the sole basis for treatment or other patient management decisions. Negative results must be combined with clinical observations, patient history, and epidemiological information. The  expected result is Negative. Fact Sheet for Patients: SugarRoll.be Fact Sheet for Healthcare Providers: https://www.woods-mathews.com/ This test is not yet approved or cleared by the [place] FDA and  has been authorized for detection and/or diagnosis of SARS-CoV-2 by FDA under an Emergency Use Authorization (EUA). This EUA will remain  in effect (meaning this test can be used) for the duration of the COVID-19 declaration under Section 56 4(b)(1) of the Act, 21 U.S.C. section 360bbb-3(b)(1), unless the authorization is terminated or revoked sooner. Performed at [facility], [place] [address], [place],  [address]      Labs: BNP (last 3 results) Recent Labs    06/01/19 1921  BNP [id number]   Basic Metabolic Panel: Recent Labs  Lab 12/05/19 1006 12/06/19 1633 12/07/19 0558  NA 134* 137 139  K 4.8 5.4* 5.0  CL 101 102 104  CO2 26 27 26   GLUCOSE 180* 257* 180*  BUN 32* 27* 27*  CREATININE 1.03 1.09 1.15  CALCIUM 9.1 9.0 9.1   Liver Function Tests: Recent Labs  Lab 12/05/19 1006  AST 29  ALT 28  ALKPHOS 66  BILITOT 0.2*  PROT 6.2*  ALBUMIN 2.4*   No results for input(s): LIPASE, AMYLASE in the last 168 hours. No results for input(s): AMMONIA in the last 168 hours. CBC: Recent Labs  Lab 12/05/19 0950 12/06/19 1633 12/07/19 0558  WBC 10.8* 11.3* 11.9*  NEUTROABS 6.6  --   --   HGB 7.3* 6.6* 8.1*  HCT 22.8* 20.9* 25.6*  MCV 92.7 92.5 91.1  PLT 493* 482* 480*   Cardiac Enzymes: No results for input(s): CKTOTAL, CKMB, CKMBINDEX, TROPONINI in the last 168 hours. BNP: Invalid input(s): POCBNP CBG: Recent Labs  Lab 12/06/19 1135 12/06/19 1640 12/06/19 2235 12/07/19 0739 12/07/19 1149  GLUCAP 133* 275* 203* 156* 272*   D-Dimer No results for input(s): DDIMER in the last 72 hours. Hgb A1c No results for input(s): HGBA1C in the last 72 hours. Lipid Profile No results for input(s): CHOL, HDL, LDLCALC,  TRIG, CHOLHDL, LDLDIRECT in the last 72 hours. Thyroid function studies No results for input(s): TSH,  T4TOTAL, T3FREE, THYROIDAB in the last 72 hours.  Invalid input(s): FREET3 Anemia work up Recent Labs    12/05/19 1859 12/05/19 2156  FERRITIN 1,668*  --   TIBC 199*  --   IRON 19*  --   RETICCTPCT  --  1.5   Urinalysis    Component Value Date/Time   COLORURINE YELLOW 05/28/2019 [place] 05/28/2019 0640   LABSPEC 1.012 05/28/2019 0640   PHURINE 5.0 05/28/2019 0640   GLUCOSEU NEGATIVE 05/28/2019 0640   HGBUR NEGATIVE 05/28/2019 0640   BILIRUBINUR NEGATIVE 05/28/2019 0640   KETONESUR NEGATIVE 05/28/2019 0640   PROTEINUR NEGATIVE 05/28/2019 0640   UROBILINOGEN 0.2 04/17/2014 1756   NITRITE NEGATIVE 05/28/2019 0640   LEUKOCYTESUR NEGATIVE 05/28/2019 0640   Sepsis Labs Invalid input(s): PROCALCITONIN,  WBC,  LACTICIDVEN Microbiology Recent Results (from the past 240 hour(s))  Aerobic/Anaerobic Culture (surgical/deep wound)     Status: None   Collection Time: 11/30/19  2:15 PM   Specimen: Wound  Result Value Ref Range Status   Specimen Description   Final    WOUND Performed at [facility], [place] [address], [place], [place] [address]    Special Requests   Final    SACRAL Performed at [facility], [place] [address] [place], [place] [address]    Gram Stain   Final    FEW WBC PRESENT, PREDOMINANTLY PMN ABUNDANT GRAM POSITIVE COCCI ABUNDANT GRAM NEGATIVE RODS ABUNDANT GRAM POSITIVE RODS    Culture   Final    MODERATE ENTEROCOCCUS AVIUM FEW ESCHERICHIA COLI UNABLE TO PERFORM SUSCEPTIBILITY ON ENTEROCOCCUS AVIUM MODERATE BACTEROIDES THETAIOTAOMICRON BETA LACTAMASE POSITIVE Performed at [facility], [place] [address], [place], [place] [address]    Report Status 12/06/2019 FINAL  Final   Organism ID, Bacteria ESCHERICHIA COLI  Final      Susceptibility   Escherichia coli - MIC*    AMPICILLIN <=2 SENSITIVE  Sensitive     CEFAZOLIN <=4 SENSITIVE Sensitive     CEFEPIME <=1 SENSITIVE Sensitive     CEFTAZIDIME <=1 SENSITIVE Sensitive     CEFTRIAXONE <=1 SENSITIVE Sensitive     CIPROFLOXACIN <=0.25 SENSITIVE Sensitive     GENTAMICIN <=1 SENSITIVE Sensitive     IMIPENEM <=0.25 SENSITIVE Sensitive     TRIMETH/SULFA <=20 SENSITIVE Sensitive     AMPICILLIN/SULBACTAM <=2 SENSITIVE Sensitive     PIP/TAZO <=4 SENSITIVE Sensitive     * FEW ESCHERICHIA COLI  Blood culture (routine x 2)     Status: None (Preliminary result)   Collection Time: 12/05/19 11:25 AM   Specimen: BLOOD LEFT HAND  Result Value Ref Range Status   Specimen Description BLOOD LEFT HAND  Final   Special Requests   Final    BOTTLES DRAWN AEROBIC AND ANAEROBIC Blood Culture results may not be optimal due to an inadequate volume of blood received in culture bottles   Culture   Final    NO GROWTH 2 DAYS Performed at [facility], [address] [place], [place] [address]    Report Status PENDING  Incomplete  Blood culture (routine x 2)     Status: None (Preliminary result)   Collection Time: 12/05/19 12:09 PM   Specimen: BLOOD LEFT HAND  Result Value Ref Range Status   Specimen Description BLOOD LEFT HAND  Final   Special Requests   Final    BOTTLES DRAWN AEROBIC AND ANAEROBIC Blood Culture adequate volume   Culture   Final  NO GROWTH 2 DAYS Performed at [facility], [place] [address], [place], [place] [address]    Report Status PENDING  Incomplete  SARS CORONAVIRUS 2 (TAT 6-24 HRS) Nasopharyngeal Nasopharyngeal Swab     Status: None   Collection Time: 12/05/19  9:16 PM   Specimen: Nasopharyngeal Swab  Result Value Ref Range Status   SARS Coronavirus 2 NEGATIVE NEGATIVE Final    Comment: (NOTE) SARS-CoV-2 target nucleic acids are NOT DETECTED. The SARS-CoV-2 RNA is generally detectable in upper and lower respiratory specimens during the acute phase of infection. Negative results do not preclude SARS-CoV-2  infection, do not rule out co-infections with other pathogens, and should not be used as the sole basis for treatment or other patient management decisions. Negative results must be combined with clinical observations, patient history, and epidemiological information. The expected result is Negative. Fact Sheet for Patients: SugarRoll.be Fact Sheet for Healthcare Providers: https://www.woods-mathews.com/ This test is not yet approved or cleared by the [place] FDA and  has been authorized for detection and/or diagnosis of SARS-CoV-2 by FDA under an Emergency Use Authorization (EUA). This EUA will remain  in effect (meaning this test can be used) for the duration of the COVID-19 declaration under Section 56 4(b)(1) of the Act, 21 U.S.C. section 360bbb-3(b)(1), unless the authorization is terminated or revoked sooner. Performed at [facility], [place] [address] [place], [place] [address]      Time coordinating discharge:35 MINUTES.   SIGNED:   Hosie Poisson, MD  [facility] 12/07/2019, 4:25 PM

## 2019-12-07 NOTE — Plan of Care (Signed)

## 2019-12-07 NOTE — Consult Note (Signed)
   [facility] CM Inpatient Consult   12/07/2019  [name] [birth year] [id number]  Patient is currently active with [facility] for chronic disease management services.  Patient has been engaged by a [facility] for [facility].  Our community based plan of care has focused on disease management and community resource support.    Patient will receive a post hospital call and will be evaluated for assessments and disease process education.    Plan: Update [facility] RN Care Manager of patient's return home with daughter [name] and regarding Palliative Care conversation and home with home health..    Of note, [facility] Care Management services does not replace or interfere with any services that are needed or arranged by inpatient [facility] care management team.  For additional questions or referrals please contact:  [name] Brood, RN BSN [facility] Liaison  [phone] business mobile phone Toll free office [phone]  Fax number: [phone] [name]

## 2019-12-07 NOTE — TOC Transition Note (Signed)
Transition of Care (TOC) - CM/SW Discharge Note Marvetta Gibbons RN,BSN Transitions of Care Unit 4NP (non trauma) - RN Case Manager [phone]    Patient Details  Name: [name] MRN: [id number] Date of Birth: 1922/06/24  Transition of Care [facility]) CM/SW Contact:  Dawayne Patricia, RN Phone Number: 12/07/2019, 3:37 PM   Clinical Narrative:    Pt stable for transition home today, per [facility] team daughter is not interested in SNF placement and prefers to take pt home with [facility], call made to daughter this am and msg left for return call. Daughter returned call around 1330 to discuss [place] needs- pt is active with [facility] for [facility]- daughter states she would like to look into another [facility] agencies- asked if she had another agency in mind- she states she does not have a preference however does not want [facility]. Discussed DME in the home- pt has needed DME at this time- including Hospital bed, lift and w/c. Explained to daughter that it may be difficult to find another agency at this time as a lot of the agencies are short in their nursing staffing- however CM will reach out to some agencies that are in-network with [facility] and see if any can accept.  Call made to [facility]- unable to accept, [facility]- unable to accept, [facility]- unable to accept.  Spoke again to daughter at the bedside and informed her that CM was not successful in finding another agency at this time- [facility] can still provide needed services in addition to the PT/OT/SW and bath aide with OT- explained that if she was still not satisfied she could explore other options in a few weeks and make a change should staffing improve and another agency is willing to accept- PCP could give new orders- list provided to her for reference Per CMS guidelines from medicare.gov website with star ratings (copy placed in shadow chart).  Confirmed with daughter she needed [facility] transport, for wound care clinic transportation daughter states she  usually will set up with insurance transportation but pt will need non emergent EMS transport home today, address confirmed and [facility] called for transport at 1509, paperwork for transport placed on shadow chart.   Call made to [facility] with [facility] for resumption of [place] services.    Final next level of care: [place] Barriers to Discharge: No Barriers Identified   Patient Goals and CMS Choice Patient states their goals for this hospitalization and ongoing recovery are:: return home-declines SNF CMS Medicare.gov Compare Post Acute Care list provided to:: Patient Represenative (must comment)(daughter) Choice offered to / list presented to : Adult Children  Discharge Placement                       Discharge Plan and Services   Discharge Planning Services: CM Consult Post Acute Care Choice: Home Health, Resumption of Svcs/PTA Provider          DME Arranged: N/A DME Agency: NA       HH Arranged: RN, PT, OT, Nurse's Aide, Social Work [employer] Agency: [facility] Date [place]: 12/07/19 Time [place]: [place] Representative spoke with at [place]: Cassie  Social Determinants of Health ([place]) Interventions     Readmission Risk Interventions Readmission Risk Prevention Plan 12/07/2019  Transportation Screening Complete  PCP or Specialist Appt within 3-5 Days Complete  HRI or [place] Complete  Social Work Consult for [place] Planning/Counseling Complete  Palliative  Care Screening Complete  Medication Review (RN Care Manager) Complete  Some recent data might be hidden

## 2019-12-08 DIAGNOSIS — I5032 Chronic diastolic (congestive) heart failure: Secondary | ICD-10-CM | POA: Diagnosis not present

## 2019-12-08 DIAGNOSIS — N183 Chronic kidney disease, stage 3 unspecified: Secondary | ICD-10-CM | POA: Diagnosis not present

## 2019-12-08 DIAGNOSIS — E11621 Type 2 diabetes mellitus with foot ulcer: Secondary | ICD-10-CM | POA: Diagnosis not present

## 2019-12-08 DIAGNOSIS — L97422 Non-pressure chronic ulcer of left heel and midfoot with fat layer exposed: Secondary | ICD-10-CM | POA: Diagnosis not present

## 2019-12-08 DIAGNOSIS — U071 COVID-19: Secondary | ICD-10-CM | POA: Diagnosis not present

## 2019-12-08 DIAGNOSIS — I2699 Other pulmonary embolism without acute cor pulmonale: Secondary | ICD-10-CM | POA: Diagnosis not present

## 2019-12-08 DIAGNOSIS — I130 Hypertensive heart and chronic kidney disease with heart failure and stage 1 through stage 4 chronic kidney disease, or unspecified chronic kidney disease: Secondary | ICD-10-CM | POA: Diagnosis not present

## 2019-12-08 DIAGNOSIS — E1122 Type 2 diabetes mellitus with diabetic chronic kidney disease: Secondary | ICD-10-CM | POA: Diagnosis not present

## 2019-12-08 DIAGNOSIS — F0391 Unspecified dementia with behavioral disturbance: Secondary | ICD-10-CM | POA: Diagnosis not present

## 2019-12-08 DIAGNOSIS — L89154 Pressure ulcer of sacral region, stage 4: Secondary | ICD-10-CM | POA: Diagnosis not present

## 2019-12-10 ENCOUNTER — Encounter (HOSPITAL_BASED_OUTPATIENT_CLINIC_OR_DEPARTMENT_OTHER): Payer: Medicare HMO | Attending: Internal Medicine | Admitting: Internal Medicine

## 2019-12-10 ENCOUNTER — Telehealth: Payer: Self-pay | Admitting: *Deleted

## 2019-12-10 ENCOUNTER — Telehealth: Payer: Self-pay

## 2019-12-10 ENCOUNTER — Other Ambulatory Visit: Payer: Self-pay

## 2019-12-10 DIAGNOSIS — I2699 Other pulmonary embolism without acute cor pulmonale: Secondary | ICD-10-CM | POA: Diagnosis not present

## 2019-12-10 DIAGNOSIS — E1151 Type 2 diabetes mellitus with diabetic peripheral angiopathy without gangrene: Secondary | ICD-10-CM | POA: Insufficient documentation

## 2019-12-10 DIAGNOSIS — E11621 Type 2 diabetes mellitus with foot ulcer: Secondary | ICD-10-CM | POA: Insufficient documentation

## 2019-12-10 DIAGNOSIS — Z8673 Personal history of transient ischemic attack (TIA), and cerebral infarction without residual deficits: Secondary | ICD-10-CM | POA: Diagnosis not present

## 2019-12-10 DIAGNOSIS — E1139 Type 2 diabetes mellitus with other diabetic ophthalmic complication: Secondary | ICD-10-CM | POA: Insufficient documentation

## 2019-12-10 DIAGNOSIS — R6889 Other general symptoms and signs: Secondary | ICD-10-CM | POA: Diagnosis not present

## 2019-12-10 DIAGNOSIS — I130 Hypertensive heart and chronic kidney disease with heart failure and stage 1 through stage 4 chronic kidney disease, or unspecified chronic kidney disease: Secondary | ICD-10-CM | POA: Diagnosis not present

## 2019-12-10 DIAGNOSIS — L97522 Non-pressure chronic ulcer of other part of left foot with fat layer exposed: Secondary | ICD-10-CM | POA: Insufficient documentation

## 2019-12-10 DIAGNOSIS — H42 Glaucoma in diseases classified elsewhere: Secondary | ICD-10-CM | POA: Diagnosis not present

## 2019-12-10 DIAGNOSIS — M1990 Unspecified osteoarthritis, unspecified site: Secondary | ICD-10-CM | POA: Insufficient documentation

## 2019-12-10 DIAGNOSIS — I2510 Atherosclerotic heart disease of native coronary artery without angina pectoris: Secondary | ICD-10-CM | POA: Diagnosis not present

## 2019-12-10 DIAGNOSIS — L89154 Pressure ulcer of sacral region, stage 4: Secondary | ICD-10-CM | POA: Diagnosis not present

## 2019-12-10 DIAGNOSIS — N183 Chronic kidney disease, stage 3 unspecified: Secondary | ICD-10-CM | POA: Diagnosis not present

## 2019-12-10 DIAGNOSIS — F0150 Vascular dementia without behavioral disturbance: Secondary | ICD-10-CM | POA: Insufficient documentation

## 2019-12-10 DIAGNOSIS — L97422 Non-pressure chronic ulcer of left heel and midfoot with fat layer exposed: Secondary | ICD-10-CM | POA: Diagnosis not present

## 2019-12-10 DIAGNOSIS — I5032 Chronic diastolic (congestive) heart failure: Secondary | ICD-10-CM | POA: Diagnosis not present

## 2019-12-10 DIAGNOSIS — E1169 Type 2 diabetes mellitus with other specified complication: Secondary | ICD-10-CM | POA: Diagnosis not present

## 2019-12-10 DIAGNOSIS — I10 Essential (primary) hypertension: Secondary | ICD-10-CM | POA: Insufficient documentation

## 2019-12-10 DIAGNOSIS — J449 Chronic obstructive pulmonary disease, unspecified: Secondary | ICD-10-CM | POA: Insufficient documentation

## 2019-12-10 DIAGNOSIS — F0391 Unspecified dementia with behavioral disturbance: Secondary | ICD-10-CM | POA: Diagnosis not present

## 2019-12-10 DIAGNOSIS — E1122 Type 2 diabetes mellitus with diabetic chronic kidney disease: Secondary | ICD-10-CM | POA: Diagnosis not present

## 2019-12-10 LAB — CULTURE, BLOOD (ROUTINE X 2)
Culture: NO GROWTH
Culture: NO GROWTH
Special Requests: ADEQUATE

## 2019-12-10 NOTE — Patient Outreach (Addendum)
[facility]) Care Management  [place]  12/10/2019   [name] 06-13-1922 [id number]  Subjective: Telephone call to daughter [name] for hospital follow up. She states that patient is good.  She is unable to give details as to wound description.  Discussed healthy wound status such as red/pink tissue meaning good wound tissue.  Discussed signs of infection and when to notify doctor. Discussed importance of follow up with physicians and home health. She states that the home health nurse came out on Saturday and will be back on Thursday,  She states PT came today.  She states that patient appetite is good. Discussed supplement use.  She verbalized understanding and voices no concerns but needed to end call.  Objective:   Encounter Medications:  Outpatient Encounter Medications as of 12/10/2019  Medication Sig  . acetaminophen (TYLENOL) 500 MG tablet Take 500 mg by mouth every 6 (six) hours as needed for moderate pain.  [name] albuterol (VENTOLIN HFA) 108 (90 Base) MCG/ACT inhaler Inhale 2 puffs into the lungs every 4 (four) hours as needed for wheezing or shortness of breath. INHALE TWO PUFFS EVERY 4-6 HOURS AS NEEDED FOR DIFFICULTY BREATHING,WHEEZING OR COUGH (Patient taking differently: Inhale 2 puffs into the lungs every 4 (four) hours as needed for wheezing or shortness of breath. )  . Alcohol Swabs (SURE-PREP ALCOHOL PREP) 70 % PADS 1 each by Does not apply route as needed.  [name] amLODipine (NORVASC) 5 MG tablet Take 1 tablet (5 mg total) by mouth daily.  [name] amoxicillin-clavulanate (AUGMENTIN) 875-125 MG tablet Take 1 tablet by mouth 2 (two) times daily for 7 days. Start date : 11/30/19  . apixaban (ELIQUIS) 5 MG TABS tablet Take 1 tablet (5 mg total) by mouth 2 (two) times daily.  [name] aspirin EC 81 MG tablet Take 1 tablet (81 mg total) by mouth daily.  . Blood Glucose Monitoring Suppl (ACCU-CHEK AVIVA) device Use to test blood sugar three times daily. Dx E11.51  .  escitalopram (LEXAPRO) 5 MG tablet TAKE 1 TABLET EVERY DAY (Patient taking differently: Take 5 mg by mouth daily. )  . glucose blood (ACCU-CHEK AVIVA) test strip Use to test blood sugar three times daily. Dx: E11.51  . hydrochlorothiazide (HYDRODIURIL) 25 MG tablet TAKE 1 TABLET EVERY DAY (Patient taking differently: Take 25 mg by mouth daily. )  . insulin glargine (LANTUS) 100 UNIT/ML injection Inject 8 Units into the skin daily.   . Insulin Pen Needle (B-D UF III MINI PEN NEEDLES) 31G X 5 MM MISC USE AS DIRECTED.  Elmore Guise Devices (ACCU-CHEK SOFTCLIX) lancets Use as instructed with checking blood sugar three times daily. Dx: E11.51  . Loratadine 10 MG CAPS Take 1 capsule (10 mg total) by mouth daily.  . Multiple Vitamins-Minerals (SENTRY SENIOR) TABS Take 1 tablet by mouth daily.  [name] NAMZARIC 28-10 MG CP24 TAKE 1 CAPSULE EVERY DAY (Patient taking differently: Take 1 capsule by mouth daily. )  . simvastatin (ZOCOR) 20 MG tablet Take 1 tablet (20 mg total) by mouth daily.  . tamsulosin (FLOMAX) 0.4 MG CAPS capsule Take 1 capsule (0.4 mg total) by mouth daily after supper.  . feeding supplement, ENSURE ENLIVE, (ENSURE ENLIVE) LIQD Take 237 mLs by mouth 2 (two) times daily between meals.   No facility-administered encounter medications on file as of 12/10/2019.    Functional Status:  In your present state of health, do you have any difficulty performing the following activities: 12/10/2019 07/16/2019  Hearing? N N  Vision?  N N  Difficulty concentrating or making decisions? Y Y  Comment patient has dementia Patient needs assistance with all aspects of care.  Walking or climbing stairs? Y Y  Comment bed bound -  Dressing or bathing? [name]  Comment total care -  Doing errands, shopping? [name]  Preparing Food and eating ? [name]  Comment family assists with food. needs help with all aspects of care.  Using the Toilet? Y Y  Comment patient incontinent -  In the past six months, have you accidently  leaked urine? (No Data) Y  Comment incontinent -  Do you have problems with loss of bowel control? Y Y  Comment incontinent -  Managing your Medications? [name]  Comment daughter assists -  Managing your Finances? [name]  Comment daughter handles. -  Housekeeping or managing your Housekeeping? [name]  Comment daughter handles -  Some recent data might be hidden    Fall/Depression Screening: Fall Risk  12/10/2019 12/10/2019 11/13/2019  Falls in the past year? 0 0 0  Number falls in past yr: - - -  Injury with Fall? - - -  Comment - - -  Risk Factor Category  - - -  Risk for fall due to : - - -  Follow up - - -   PHQ 2/9 Scores 12/10/2019 11/09/2019 10/03/2019 07/16/2019 07/16/2019 01/22/2019 12/01/2018  PHQ - 2 Score - 0 0 - - 0 0  Exception Documentation Medical reason - - Other- indicate reason in comment box Other- indicate reason in comment box - -  Not completed - - - - unable to assess - -   SDOH Screenings   Alcohol Screen:   . Last Alcohol Screening Score (AUDIT):   Depression (PHQ2-9): Low Risk   . PHQ-2 Score: 0  Financial Resource Strain:   . Difficulty of Paying Living Expenses:   Food Insecurity: No Food Insecurity  . Worried About Charity fundraiser in the Last Year: Never true  . Ran Out of Food in the Last Year: Never true  Housing: Low Risk   . Last Housing Risk Score: 0  Physical Activity: Inactive  . Days of Exercise per Week: 0 days  . Minutes of Exercise per Session: 0 min  Social Connections:   . Frequency of Communication with Friends and Family:   . Frequency of Social Gatherings with Friends and Family:   . Attends Religious Services:   . Active Member of Clubs or Organizations:   . Attends Archivist Meetings:   [name] Marital Status:   Stress:   . Feeling of Stress :   Tobacco Use: Medium Risk  . Smoking Tobacco Use: Former Smoker  . Smokeless Tobacco Use: Never Used  Transportation Needs: No Transportation Needs  . Lack of Transportation  (Medical): No  . Lack of Transportation (Non-Medical): No     Assessment: Patient with recent hospitalization due to stage 4 sacral ulcer.    Plan:  [facility] CM Care Plan Problem One     Most Recent Value  Care Plan Problem One  Knowledge deficit diabetes management  Role Documenting the Problem One  Care Management Telephonic Coordinator  Care Plan for Problem One  Active  [facility] Long Term Goal   Patient A1c will remain less than 6.5 within 90 days.  [facility] Long Term Goal Start Date  11/13/19  Interventions for Problem One Long Term Goal  RN CM discussed importance of maintaining control of blood sugars  and explained how infection can cause problems with blood sugar control.      [facility] CM Care Plan Problem Two     Most Recent Value  Care Plan Problem Two  Recent hospitalization -stage 4 sacral wound  Role Documenting the Problem Two  Care Management Telephonic Coordinator  Care Plan for Problem Two  Active  Interventions for Problem Two Long Term Goal   Discussed with patient current clinical condition since hospital discharge, confirmed that patient has all medications and denies concerns around medications, reviewed post-hospital discharge instructions with patient and confirmed that patient has reliable transportation to all scheduled provider appointments post-hospital discharge.  [facility] Long Term Goal  Patient will not readmit to hospital within 31 days.    [facility] Long Term Goal Start Date  12/10/19  [facility] CM Short Term Goal #1   Patient will follow up with [facility] care center within 14 days.  [facility] CM Short Term Goal #1 Start Date  12/10/19  Interventions for Short Term Goal #2   Discussed with daughter importance of wound care follow up.    [facility] CM Short Term Goal #2   Daughter will be able to verbalize signs of wound infection within 14 days.    [facility] CM Short Term Goal #2 Start Date  12/10/19  Interventions for Short Term Goal #2  Discussed with daughter signs of infection and importance of keeping wound  clean and dry.  Discussed importance of use of supplements such as ensure to boost protein.       RN CM will send update to physician. RN CM will outreach again in the month of May and daughter agreeable.    Jone Baseman, RN, MSN [facility] Care Management Coordinator Direct Line [phone] Cell [phone] Toll Free: [phone]  Fax: [phone]

## 2019-12-10 NOTE — Telephone Encounter (Signed)
Lurline Idol the RN case manager with [facility] home [facility] states they did start back services ( resumption of care) with the patient.   [phone]

## 2019-12-10 NOTE — Telephone Encounter (Signed)
Transition Care Management Follow-up Telephone Call  Date of discharge and from where: 12/07/2019 [facility]  How have you been since you were released from the hospital? [facility] per daughter  Any questions or concerns? No   Items Reviewed:  Did the pt receive and understand the discharge instructions provided? Yes   Medications obtained and verified? Yes   Any new allergies since your discharge? No   Dietary orders reviewed? Yes  Do you have support at home? Yes  Daughter, [name]  Other (ie: DME, Home Health, etc) Home Health  Functional Questionnaire: (I = Independent and D = Dependent) ADL's: D Wheelchair  Bathing/Dressing- D   Meal Prep- D  Eating- I  Maintaining continence- I  Transferring/Ambulation- D Wheelchair  Managing Meds- D daughter gives medications.    Follow up appointments reviewed:    PCP Hospital f/u appt confirmed? Yes  Scheduled to see Dinah on 12/14/19 Requested a Friday.  [facility] f/u appt confirmed? Yes  Scheduled to see Dr Dellia Nims on 5/17.  Are transportation arrangements needed? No   If their condition worsens, is the pt aware to call  their PCP or go to the ED? Yes  Was the patient provided with contact information for the PCP's office or ED? Yes  Was the pt encouraged to call back with questions or concerns? Yes

## 2019-12-11 DIAGNOSIS — L97422 Non-pressure chronic ulcer of left heel and midfoot with fat layer exposed: Secondary | ICD-10-CM | POA: Diagnosis not present

## 2019-12-11 DIAGNOSIS — E1122 Type 2 diabetes mellitus with diabetic chronic kidney disease: Secondary | ICD-10-CM | POA: Diagnosis not present

## 2019-12-11 DIAGNOSIS — I130 Hypertensive heart and chronic kidney disease with heart failure and stage 1 through stage 4 chronic kidney disease, or unspecified chronic kidney disease: Secondary | ICD-10-CM | POA: Diagnosis not present

## 2019-12-11 DIAGNOSIS — F0391 Unspecified dementia with behavioral disturbance: Secondary | ICD-10-CM | POA: Diagnosis not present

## 2019-12-11 DIAGNOSIS — N183 Chronic kidney disease, stage 3 unspecified: Secondary | ICD-10-CM | POA: Diagnosis not present

## 2019-12-11 DIAGNOSIS — E11621 Type 2 diabetes mellitus with foot ulcer: Secondary | ICD-10-CM | POA: Diagnosis not present

## 2019-12-11 DIAGNOSIS — I2699 Other pulmonary embolism without acute cor pulmonale: Secondary | ICD-10-CM | POA: Diagnosis not present

## 2019-12-11 DIAGNOSIS — L89154 Pressure ulcer of sacral region, stage 4: Secondary | ICD-10-CM | POA: Diagnosis not present

## 2019-12-11 DIAGNOSIS — I5032 Chronic diastolic (congestive) heart failure: Secondary | ICD-10-CM | POA: Diagnosis not present

## 2019-12-11 NOTE — Progress Notes (Signed)
CAANAN, VOIT ([id number]) Visit Report for 12/10/2019 Arrival Information Details Patient Name: Date of Service: THANOS, CAPAN 12/10/2019 3:15 PM Medical Record Number: [id number] Patient Account Number: [id number] Date of Birth/Sex: Treating RN: January 24, 1922 (86 y.o. Marvis Repress Primary Care Ellanore Vanhook: Hollace Kinnier Other Clinician: Referring Emoree Sasaki: Treating Destany Severns/Extender: Frederick Peers in Treatment: 7 Visit Information History Since Last Visit Added or deleted any medications: No Patient Arrived: Wheel Chair Any new allergies or adverse reactions: No Arrival Time: 15:46 Had a fall or experienced change in No Accompanied By: daughter activities of daily living that may affect Transfer Assistance: Harrel Lemon Lift risk of falls: Patient Identification Verified: Yes Signs or symptoms of abuse/neglect since last visito No Secondary Verification Process Completed: Yes Hospitalized since last visit: Yes Patient Requires Transmission-Based Precautions: No Implantable device outside of the clinic excluding No Patient Has Alerts: Yes cellular tissue based products placed in the center Patient Alerts: Patient on Blood Thinner since last visit: L ABI non compressible Has Dressing in Place as Prescribed: Yes Pain Present Now: No Electronic Signature(s) Signed: 12/11/2019 5:14:55 PM By: Kela Millin Entered By: Kela Millin on 12/10/2019 15:46:53 -------------------------------------------------------------------------------- Clinic Level of Care Assessment Details Patient Name: Date of Service: LUCY, SACRE 12/10/2019 3:15 PM Medical Record Number: [id number] Patient Account Number: [id number] Date of Birth/Sex: Treating RN: Jul 21, 1922 (86 y.o. [name] Primary Care Zakiya Sporrer: Hollace Kinnier Other Clinician: Referring Ahniyah Giancola: Treating Tavonna Worthington/Extender: Frederick Peers in Treatment: 7 Clinic Level of Care  Assessment Items TOOL 4 Quantity Score X- 1 0 Use when only an EandM is performed on FOLLOW-UP visit ASSESSMENTS - Nursing Assessment / Reassessment X- 1 10 Reassessment of Co-morbidities (includes updates in patient status) X- 1 5 Reassessment of Adherence to Treatment Plan ASSESSMENTS - Wound and Skin A ssessment / Reassessment []  - 0 Simple Wound Assessment / Reassessment - one wound X- 2 5 Complex Wound Assessment / Reassessment - multiple wounds []  - 0 Dermatologic / Skin Assessment (not related to wound area) ASSESSMENTS - Focused Assessment []  - 0 Circumferential Edema Measurements - multi extremities []  - 0 Nutritional Assessment / Counseling / Intervention X- 1 5 Lower Extremity Assessment (monofilament, tuning fork, pulses) []  - 0 Peripheral Arterial Disease Assessment (using hand held doppler) ASSESSMENTS - Ostomy and/or Continence Assessment and Care []  - 0 Incontinence Assessment and Management []  - 0 Ostomy Care Assessment and Management (repouching, etc.) PROCESS - Coordination of Care X - Simple Patient / Family Education for ongoing care 1 15 []  - 0 Complex (extensive) Patient / Family Education for ongoing care X- 1 10 Staff obtains Programmer, systems, Records, T Results / Process Orders est X- 1 10 Staff telephones HHA, Nursing Homes / Clarify orders / etc []  - 0 Routine Transfer to another Facility (non-emergent condition) []  - 0 Routine Hospital Admission (non-emergent condition) []  - 0 New Admissions / Biomedical engineer / Ordering NPWT Apligraf, etc. , []  - 0 Emergency Hospital Admission (emergent condition) X- 1 10 Simple Discharge Coordination []  - 0 Complex (extensive) Discharge Coordination PROCESS - Special Needs []  - 0 Pediatric / Minor Patient Management []  - 0 Isolation Patient Management []  - 0 Hearing / Language / Visual special needs []  - 0 Assessment of Community assistance (transportation, D/C planning, etc.) []  -  0 Additional assistance / Altered mentation []  - 0 Support Surface(s) Assessment (bed, cushion, seat, etc.) INTERVENTIONS - Wound Cleansing / Measurement []  - 0 Simple Wound Cleansing - one wound X- 2  5 Complex Wound Cleansing - multiple wounds X- 1 5 Wound Imaging (photographs - any number of wounds) []  - 0 Wound Tracing (instead of photographs) []  - 0 Simple Wound Measurement - one wound X- 2 5 Complex Wound Measurement - multiple wounds INTERVENTIONS - Wound Dressings []  - 0 Small Wound Dressing one or multiple wounds X- 2 15 Medium Wound Dressing one or multiple wounds []  - 0 Large Wound Dressing one or multiple wounds X- 1 5 Application of Medications - topical []  - 0 Application of Medications - injection INTERVENTIONS - Miscellaneous []  - 0 External ear exam []  - 0 Specimen Collection (cultures, biopsies, blood, body fluids, etc.) []  - 0 Specimen(s) / Culture(s) sent or taken to Lab for analysis X- 1 10 Patient Transfer (multiple staff / Civil Service fast streamer / Similar devices) []  - 0 Simple Staple / Suture removal (25 or less) []  - 0 Complex Staple / Suture removal (26 or more) []  - 0 Hypo / Hyperglycemic Management (close monitor of Blood Glucose) []  - 0 Ankle / Brachial Index (ABI) - do not check if billed separately X- 1 5 Vital Signs Has the patient been seen at the hospital within the last three years: Yes Total Score: 150 Level Of Care: New/Established - Level 4 Electronic Signature(s) Signed: 12/11/2019 5:17:28 PM By: Levan Hurst RN, BSN Entered By: Levan Hurst on 12/11/2019 08:42:44 -------------------------------------------------------------------------------- Encounter Discharge Information Details Patient Name: Date of Service: [facility] TIMON, FOGLIA 12/10/2019 3:15 PM Medical Record Number: [id number] Patient Account Number: [id number] Date of Birth/Sex: Treating RN: October 03, 1921 (86 y.o. [name] Primary Care Summerlyn Fickel: Hollace Kinnier Other  Clinician: Referring Aisling Emigh: Treating Arah Aro/Extender: Frederick Peers in Treatment: 7 Encounter Discharge Information Items Discharge Condition: Stable Ambulatory Status: Wheelchair Discharge Destination: Home Transportation: Private Auto Accompanied By: daughter Schedule Follow-up Appointment: Yes Clinical Summary of Care: Electronic Signature(s) Signed: 12/10/2019 5:18:32 PM By: Deon Pilling Entered By: Deon Pilling on 12/10/2019 17:02:29 -------------------------------------------------------------------------------- Lower Extremity Assessment Details Patient Name: Date of Service: MAXXIMUS, BOEDING 12/10/2019 3:15 PM Medical Record Number: [id number] Patient Account Number: [id number] Date of Birth/Sex: Treating RN: 23-May-1922 (86 y.o. Marvis Repress Primary Care Keena Dinse: Hollace Kinnier Other Clinician: Referring Cypher Paule: Treating Yuji Walth/Extender: Frederick Peers in Treatment: 7 Electronic Signature(s) Signed: 12/11/2019 5:14:55 PM By: Kela Millin Entered By: Kela Millin on 12/10/2019 15:50:51 -------------------------------------------------------------------------------- Multi Wound Chart Details Patient Name: Date of Service: [name] DONOVANN, ORRICK 12/10/2019 3:15 PM Medical Record Number: [id number] Patient Account Number: [id number] Date of Birth/Sex: Treating RN: 02/21/1922 (86 y.o. [name] Primary Care Shanique Aslinger: Hollace Kinnier Other Clinician: Referring Alfreddie Consalvo: Treating Santanna Olenik/Extender: Frederick Peers in Treatment: 7 Vital Signs Height(in): 39 Pulse(bpm): 90 Weight(lbs): 175 Blood Pressure(mmHg): 116/57 Body Mass Index(BMI): 24 Temperature(F): 98.2 Respiratory Rate(breaths/min): 19 Photos: [1:No Photos Sacrum] [2:No Photos Left Calcaneus] [N/A:N/A N/A] Wound Location: [1:Pressure Injury] [2:Pressure Injury] [N/A:N/A] Wounding Event: [1:Pressure Ulcer] [2:Diabetic  Wound/Ulcer of the Lower] [N/A:N/A] Primary Etiology: [1:N/A] [2:Extremity Pressure Ulcer] [N/A:N/A] Secondary Etiology: [1:Glaucoma, Chronic Obstructive] [2:Glaucoma, Chronic Obstructive] [N/A:N/A] Comorbid History: [1:Pulmonary Disease (COPD), Coronary Pulmonary Disease (COPD), Coronary Artery Disease, Hypertension, Type II Artery Disease, Hypertension, Type II Diabetes, Osteoarthritis, Dementia 09/03/2019] [2:Diabetes, Osteoarthritis, Dementia  10/01/2019] [N/A:N/A] Date Acquired: [1:7] [2:7] [N/A:N/A] Weeks of Treatment: [1:Open] [2:Open] [N/A:N/A] Wound Status: [1:6x4.2x3.9] [2:0.4x0.5x0.1] [N/A:N/A] Measurements L x W x D (cm) [1:19.792] [2:0.157] [N/A:N/A] A (cm) : rea [1:77.189] [2:0.016] [N/A:N/A] Volume (cm) : [1:-127.00%] [2:98.10%] [N/A:N/A] % Reduction in A rea: [1:-420.80%] [  2:98.10%] [N/A:N/A] % Reduction in Volume: [1:1] Starting Position 1 (o'clock): [1:6] Ending Position 1 (o'clock): [1:3.8] Maximum Distance 1 (cm): [1:Yes] [2:No] [N/A:N/A] Undermining: [1:Category/Stage IV] [2:Grade 2] [N/A:N/A] Classification: [1:Large] [2:Small] [N/A:N/A] Exudate A mount: [1:Purulent] [2:Serosanguineous] [N/A:N/A] Exudate Type: [1:yellow, brown, green] [2:red, brown] [N/A:N/A] Exudate Color: [1:Yes] [2:No] [N/A:N/A] Foul Odor A Cleansing: [1:fter No] [2:N/A] [N/A:N/A] Odor A nticipated Due to Product Use: [1:Well defined, not attached] [2:Flat and Intact] [N/A:N/A] Wound Margin: [1:Medium (34-66%)] [2:Large (67-100%)] [N/A:N/A] Granulation A mount: [1:Red] [2:Red, Pink] [N/A:N/A] Granulation Quality: [1:Medium (34-66%)] [2:Small (1-33%)] [N/A:N/A] Necrotic A mount: [1:Fat Layer (Subcutaneous Tissue)] [2:Fat Layer (Subcutaneous Tissue)] [N/A:N/A] Exposed Structures: [1:Exposed: Yes Muscle: Yes Bone: Yes Fascia: No Tendon: No Joint: No Small (1-33%)] [2:Exposed: Yes Fascia: No Tendon: No Muscle: No Joint: No Bone: No Large (67-100%)] [N/A:N/A] Treatment Notes Wound #1 (Sacrum) 1.  Cleanse With Wound Cleanser 2. Periwound Care Skin Prep 3. Primary Dressing Applied Calcium Alginate Ag 4. Secondary Dressing Foam Border Dressing 5. Secured With Self Adhesive Bandage Wound #2 (Left Calcaneus) 1. Cleanse With Wound Cleanser 3. Primary Dressing Applied Calcium Alginate Ag 4. Secondary Dressing Dry Gauze Roll Gauze Heel Cup 5. Secured With [employer]) Signed: 12/11/2019 8:11:21 AM By: Linton Ham MD Signed: 12/11/2019 5:17:28 PM By: Levan Hurst RN, BSN Entered By: Linton Ham on 12/11/2019 07:36:11 -------------------------------------------------------------------------------- Multi-Disciplinary Care Plan Details Patient Name: Date of Service: [facility] JYRON, STUEWE 12/10/2019 3:15 PM Medical Record Number: [id number] Patient Account Number: [id number] Date of Birth/Sex: Treating RN: 1922/01/15 (86 y.o. [name] Primary Care Jane Birkel: Hollace Kinnier Other Clinician: Referring Lige Lakeman: Treating Quadir Muns/Extender: Frederick Peers in Treatment: 7 Active Inactive Abuse / Safety / Falls / Self Care Management Nursing Diagnoses: Potential for falls Goals: Patient/caregiver will verbalize/demonstrate measures taken to prevent injury and/or falls Date Initiated: 10/17/2019 Target Resolution Date: 12/28/2019 Goal Status: Active Interventions: Assess fall risk on admission and as needed Assess: immobility, friction, shearing, incontinence upon admission and as needed Assess impairment of mobility on admission and as needed per policy Notes: Nutrition Nursing Diagnoses: Imbalanced nutrition Potential for alteratiion in Nutrition/Potential for imbalanced nutrition Goals: Patient/caregiver agrees to and verbalizes understanding of need to use nutritional supplements and/or vitamins as prescribed Date Initiated: 10/17/2019 Target Resolution Date: 12/28/2019 Goal Status: Active Interventions: Assess  patient nutrition upon admission and as needed per policy Treatment Activities: Patient referred to Primary Care Physician for further nutritional evaluation : 10/17/2019 Notes: Pressure Nursing Diagnoses: Knowledge deficit related to causes and risk factors for pressure ulcer development Knowledge deficit related to management of pressures ulcers Goals: Patient/caregiver will verbalize understanding of pressure ulcer management Date Initiated: 10/17/2019 Target Resolution Date: 12/28/2019 Goal Status: Active Interventions: Assess: immobility, friction, shearing, incontinence upon admission and as needed Assess offloading mechanisms upon admission and as needed Assess potential for pressure ulcer upon admission and as needed Provide education on pressure ulcers Notes: Wound/Skin Impairment Nursing Diagnoses: Impaired tissue integrity Knowledge deficit related to ulceration/compromised skin integrity Goals: Patient/caregiver will verbalize understanding of skin care regimen Date Initiated: 10/17/2019 Target Resolution Date: 12/28/2019 Goal Status: Active Ulcer/skin breakdown will have a volume reduction of 30% by week 4 Date Initiated: 10/17/2019 Target Resolution Date: 12/28/2019 Goal Status: Active Interventions: Assess patient/caregiver ability to obtain necessary supplies Assess patient/caregiver ability to perform ulcer/skin care regimen upon admission and as needed Assess ulceration(s) every visit Provide education on ulcer and skin care Treatment Activities: Skin care regimen initiated : 10/17/2019 Topical wound management initiated : 10/17/2019 Notes:  Electronic Signature(s) Signed: 12/11/2019 5:17:28 PM By: Levan Hurst RN, BSN Entered By: Levan Hurst on 12/11/2019 08:41:05 -------------------------------------------------------------------------------- Pain Assessment Details Patient Name: Date of Service: UNDRAY, COLMENERO 12/10/2019 3:15 PM Medical Record Number:  [id number] Patient Account Number: [id number] Date of Birth/Sex: Treating RN: [birth year] (86 y.o. Marvis Repress Primary Care Aziyah Provencal: Hollace Kinnier Other Clinician: Referring Geetika Laborde: Treating Teofilo Lupinacci/Extender: Frederick Peers in Treatment: 7 Active Problems Location of Pain Severity and Description of Pain Patient Has Paino No Site Locations Pain Management and Medication Current Pain Management: Electronic Signature(s) Signed: 12/11/2019 5:14:55 PM By: Kela Millin Entered By: Kela Millin on 12/10/2019 15:50:42 -------------------------------------------------------------------------------- Patient/Caregiver Education Details Patient Name: Date of Service: [facility] 5/10/2021andnbsp3:15 PM Medical Record Number: [id number] Patient Account Number: [id number] Date of Birth/Gender: Treating RN: Aug 25, 1921 (86 y.o. [name] Primary Care Physician: Hollace Kinnier Other Clinician: Referring Physician: Treating Physician/Extender: Frederick Peers in Treatment: 7 Education Assessment Education Provided To: Patient Education Topics Provided Wound/Skin Impairment: Methods: Explain/Verbal Responses: State content correctly Electronic Signature(s) Signed: 12/11/2019 5:17:28 PM By: Levan Hurst RN, BSN Entered By: Levan Hurst on 12/11/2019 08:41:31 -------------------------------------------------------------------------------- Wound Assessment Details Patient Name: Date of Service: MANDRELL, PRECHTEL 12/10/2019 3:15 PM Medical Record Number: [id number] Patient Account Number: [id number] Date of Birth/Sex: Treating RN: 04-06-1922 (86 y.o. Marvis Repress Primary Care Suraj Ramdass: Hollace Kinnier Other Clinician: Referring Vasilia Dise: Treating Hadiyah Maricle/Extender: Frederick Peers in Treatment: 7 Wound Status Wound Number: 1 Primary Pressure Ulcer Etiology: Wound Location: Sacrum Wound  Open Wounding Event: Pressure Injury Status: Date Acquired: 09/03/2019 Comorbid Glaucoma, Chronic Obstructive Pulmonary Disease (COPD), Weeks Of Treatment: 7 History: Coronary Artery Disease, Hypertension, Type II Diabetes, Clustered Wound: No Osteoarthritis, Dementia Wound Measurements Length: (cm) 6 Width: (cm) 4.2 Depth: (cm) 3.9 Area: (cm) 19.792 Volume: (cm) 77.189 % Reduction in Area: -127% % Reduction in Volume: -420.8% Epithelialization: Small (1-33%) Tunneling: No Undermining: Yes Starting Position (o'clock): 1 Ending Position (o'clock): 6 Maximum Distance: (cm) 3.8 Wound Description Classification: Category/Stage IV Wound Margin: Well defined, not attached Exudate Amount: Large Exudate Type: Purulent Exudate Color: yellow, brown, green Foul Odor After Cleansing: Yes Due to Product Use: No Slough/Fibrino Yes Wound Bed Granulation Amount: Medium (34-66%) Exposed Structure Granulation Quality: Red Fascia Exposed: No Necrotic Amount: Medium (34-66%) Fat Layer (Subcutaneous Tissue) Exposed: Yes Necrotic Quality: Adherent Slough Tendon Exposed: No Muscle Exposed: Yes Necrosis of Muscle: No Joint Exposed: No Bone Exposed: Yes Treatment Notes Wound #1 (Sacrum) 1. Cleanse With Wound Cleanser 2. Periwound Care Skin Prep 3. Primary Dressing Applied Calcium Alginate Ag 4. Secondary Dressing Foam Border Dressing 5. Secured With Office manager) Signed: 12/11/2019 5:14:55 PM By: Kela Millin Entered By: Kela Millin on 12/10/2019 16:16:28 -------------------------------------------------------------------------------- Wound Assessment Details Patient Name: Date of Service: TAKAYUKI, KIMBLER 12/10/2019 3:15 PM Medical Record Number: [id number] Patient Account Number: [id number] Date of Birth/Sex: Treating RN: 1921/10/25 (86 y.o. Marvis Repress Primary Care Lyrika Souders: Hollace Kinnier Other Clinician: Referring  Addysen Louth: Treating Willia Lampert/Extender: Frederick Peers in Treatment: 7 Wound Status Wound Number: 2 Primary Diabetic Wound/Ulcer of the Lower Extremity Etiology: Wound Location: Left Calcaneus Secondary Pressure Ulcer Wounding Event: Pressure Injury Etiology: Date Acquired: 10/01/2019 Wound Open Weeks Of Treatment: 7 Status: Clustered Wound: No Comorbid Glaucoma, Chronic Obstructive Pulmonary Disease (COPD), History: Coronary Artery Disease, Hypertension, Type II Diabetes, Osteoarthritis, Dementia Wound Measurements Length: (cm) 0.4 Width: (cm) 0.5 Depth: (cm) 0.1 Area: (cm) 0.157 Volume: (  cm) 0.016 % Reduction in Area: 98.1% % Reduction in Volume: 98.1% Epithelialization: Large (67-100%) Tunneling: No Undermining: No Wound Description Classification: Grade 2 Wound Margin: Flat and Intact Exudate Amount: Small Exudate Type: Serosanguineous Exudate Color: red, brown Foul Odor After Cleansing: No Slough/Fibrino No Wound Bed Granulation Amount: Large (67-100%) Exposed Structure Granulation Quality: Red, Pink Fascia Exposed: No Necrotic Amount: Small (1-33%) Fat Layer (Subcutaneous Tissue) Exposed: Yes Necrotic Quality: Adherent Slough Tendon Exposed: No Muscle Exposed: No Joint Exposed: No Bone Exposed: No Treatment Notes Wound #2 (Left Calcaneus) 1. Cleanse With Wound Cleanser 3. Primary Dressing Applied Calcium Alginate Ag 4. Secondary Dressing Dry Gauze Roll Gauze Heel Cup 5. Secured With [employer]) Signed: 12/11/2019 5:14:55 PM By: Kela Millin Entered By: Kela Millin on 12/10/2019 16:17:07 -------------------------------------------------------------------------------- Vitals Details Patient Name: Date of Service: [address] ESTILL, LUMAN 12/10/2019 3:15 PM Medical Record Number: [id number] Patient Account Number: [id number] Date of Birth/Sex: Treating RN: Mar 15, 1922 (86 y.o. Marvis Repress Primary Care Eriel Dunckel: Hollace Kinnier Other Clinician: Referring Antoinett Dorman: Treating Anthone Prieur/Extender: Frederick Peers in Treatment: 7 Vital Signs Time Taken: 15:45 Temperature (F): 98.2 Height (in): 71 Pulse (bpm): 96 Weight (lbs): 175 Respiratory Rate (breaths/min): 19 Body Mass Index (BMI): 24.4 Blood Pressure (mmHg): 116/57 Reference Range: 80 - 120 mg / dl Electronic Signature(s) Signed: 12/11/2019 5:14:55 PM By: Kela Millin Entered By: Kela Millin on 12/10/2019 15:50:35

## 2019-12-11 NOTE — Progress Notes (Signed)
JOHNTAVIOUS, BRICKLER ([id number]) Visit Report for 12/10/2019 HPI Details Patient Name: Date of Service: [name], [name] 12/10/2019 3:15 PM Medical Record Number: [id number] Patient Account Number: [id number] Date of Birth/Sex: Treating RN: May 29, 1922 (86 y.o. [name] Primary Care Provider: Hollace Kinnier Other Clinician: Referring Provider: Treating Provider/Extender: Frederick Peers in Treatment: 7 History of Present Illness HPI Description: 10/17/2019 upon evaluation today patient presents for initial inspection here in our clinic concerning issues that has been having with a wound in the sacral region as well as the left heel. Fortunately neither appears to be overtly and significantly infected there is some necrotic tissue noted at both locations however that is going to be required to be addressed. He does have a history of diabetes mellitus type 2, vascular dementia, and hypertension. The patient did have a stroke in November 2020 unfortunately he has not been the same since that time according to his daughter. Prior to that he was taking care of himself now he is 100% dependent. He is at home with his daughter who is present during the office visit today as well that is where I get the majority of the history from at this point. Patient does have a history of hypertension as well which does not appear to be too out of control today which is good news. He does have a hemoglobin A1c of 6.05 May 2019. They currently have been using a calcium alginate dressing to the sacrum and it sounds like Skin-Prep and a foam on the heel. 4/9; pressure ulcers on the sacral area and left heel. Been using Anasept wet-to-dry on the sacrum and Hydrofera Blue in the heel. He has home health family is helping with the dressing. It sounds as though they are fairly religious about offloading these areas that have pressure relief surfaces for the bed. Wounds look better 4/30; I have not  seen this patient in 3 weeks. We have been using Anasept wet-to-dry the sacrum and Hydrofera Blue on the heel. When I saw this 3 weeks ago the sacrum actually look quite good and I thought that we might be able to transition him to a collagen-based dressing today. 5/10; the since the patient was last here he was hospitalized from 5/5 through 12/07/2019. He was noted to have a pressure injury of the sacral region stage IV. MRI did not suggest discitis or osteomyelitis. There was nonspecific bilateral paraspinal muscle edema. Culture I did when he was here the last time showed E. coli I believe they are aware of this. Blood cultures were negative. His white count was 10.8 on admission 11.9 at discharge his comprehensive metabolic panel notable for an albumin of 2.4 BUN of 32 with creatinine of 0.03 hemoglobin was only 7.3 with a discharge hemoglobin of 8.1 after dropping to a hemoglobin of 6.6 He is back at home. Discharge antibiotic was Augmentin.[name] He was treated with IV Rocephin in the hospital. The patient was seen by palliative care I have not looked at this consult. They have been using dressings that they had left over at home I think most recently [facility]. According to his daughter the patient is eating fairly well Electronic Signature(s) Signed: 12/11/2019 8:11:21 AM By: Linton Ham MD Entered By: Linton Ham on 12/11/2019 07:40:01 -------------------------------------------------------------------------------- Physical Exam Details Patient Name: Date of Service: [name], [name] 12/10/2019 3:15 PM Medical Record Number: [id number] Patient Account Number: [id number] Date of Birth/Sex: Treating RN: 06/02/1922 (86 y.o. [name] Primary Care Provider:  Hollace Kinnier Other Clinician: Referring Provider: Treating Provider/Extender: Frederick Peers in Treatment: 7 Constitutional Sitting or standing Blood Pressure is within target range for patient.. Pulse  regular and within target range for patient.[name] Respirations regular, non-labored and within target range.. Temperature is normal and within the target range for the patient.[name] Appears in no distress. Respiratory work of breathing is normal. Cardiovascular It sounds are normal he does not appear to be dehydrated. Gastrointestinal (GI) Abdomen is soft and non-distended without masses or tenderness.[name] Psychiatric Baseline dementia. Notes Wound exam; in general the pit the wound looks better than when he was last here. Most of the tissue destruction is in the inferior part of the wound where the wound is still larger and has exposed bone but there is no purulence he still seems tender to palpation around the wound but there is no crepitus. Electronic Signature(s) Signed: 12/11/2019 8:11:21 AM By: Linton Ham MD Entered By: Linton Ham on 12/11/2019 07:42:16 -------------------------------------------------------------------------------- Physician Orders Details Patient Name: Date of Service: [address] KARLAN, MASTALSKI 12/10/2019 3:15 PM Medical Record Number: [id number] Patient Account Number: [id number] Date of Birth/Sex: Treating RN: Feb 03, 1922 (86 y.o. [name] Primary Care Provider: Hollace Kinnier Other Clinician: Referring Provider: Treating Provider/Extender: Frederick Peers in Treatment: 7 Verbal / Phone Orders: No Diagnosis Coding ICD-10 Coding Code Description L89.154 Pressure ulcer of sacral region, stage 4 E11.621 Type 2 diabetes mellitus with foot ulcer L97.522 Non-pressure chronic ulcer of other part of left foot with fat layer exposed F01.50 Vascular dementia without behavioral disturbance I10 Essential (primary) hypertension Follow-up Appointments ppointment in 2 weeks. - ****[facility] - ROOM 5**** Return A Dressing Change Frequency Wound #1 Sacrum Change dressing every day. Wound #2 Left Calcaneus Change dressing three times week. Skin  Barriers/Peri-Wound Care Wound #1 Sacrum Skin Prep - to periwound Wound Cleansing Wound #1 Sacrum Clean wound with Wound Cleanser Wound #2 Left Calcaneus Clean wound with Wound Cleanser Primary Wound Dressing Wound #1 Sacrum Calcium Alginate with Silver Wound #2 Left Calcaneus Calcium Alginate with Silver Secondary Dressing Wound #1 Sacrum ABD pad - secure with tegaderm to keep dressing from getting soiled Wound #2 Left Calcaneus Kerlix/Rolled Gauze Dry Gauze Heel Cup Off-Loading Turn and reposition every 2 hours Other: - float heels with pillows under calves while in bed [facility] for wound care. - [facility] Electronic Signature(s) Signed: 12/11/2019 8:11:21 AM By: Linton Ham MD Signed: 12/11/2019 5:17:28 PM By: Levan Hurst RN, BSN Entered By: Levan Hurst on 12/10/2019 16:38:59 -------------------------------------------------------------------------------- Problem List Details Patient Name: Date of Service: [name], [name] 12/10/2019 3:15 PM Medical Record Number: [id number] Patient Account Number: [id number] Date of Birth/Sex: Treating RN: 05-May-1922 (86 y.o. [name] Primary Care Provider: Hollace Kinnier Other Clinician: Referring Provider: Treating Provider/Extender: Frederick Peers in Treatment: 7 Active Problems ICD-10 Encounter Code Description Active Date MDM Diagnosis L89.154 Pressure ulcer of sacral region, stage 4 10/17/2019 No Yes E11.621 Type 2 diabetes mellitus with foot ulcer 10/17/2019 No Yes L97.522 Non-pressure chronic ulcer of other part of left foot with fat layer exposed 10/17/2019 No Yes F01.50 Vascular dementia without behavioral disturbance 10/17/2019 No Yes I10 Essential (primary) hypertension 10/17/2019 No Yes Inactive Problems Resolved Problems Electronic Signature(s) Signed: 12/11/2019 8:11:21 AM By: Linton Ham MD Entered By: Linton Ham on 12/11/2019  07:36:05 -------------------------------------------------------------------------------- Progress Note Details Patient Name: Date of Service: [name], [name] 12/10/2019 3:15 PM Medical Record Number: [id number] Patient Account Number:  [id number] Date of Birth/Sex: Treating RN: [birth year] (86 y.o. [name] Primary Care Provider: Hollace Kinnier Other Clinician: Referring Provider: Treating Provider/Extender: Frederick Peers in Treatment: 7 Subjective History of Present Illness (HPI) 10/17/2019 upon evaluation today patient presents for initial inspection here in our clinic concerning issues that has been having with a wound in the sacral region as well as the left heel. Fortunately neither appears to be overtly and significantly infected there is some necrotic tissue noted at both locations however that is going to be required to be addressed. He does have a history of diabetes mellitus type 2, vascular dementia, and hypertension. The patient did have a stroke in November 2020 unfortunately he has not been the same since that time according to his daughter. Prior to that he was taking care of himself now he is 100% dependent. He is at home with his daughter who is present during the office visit today as well that is where I get the majority of the history from at this point. Patient does have a history of hypertension as well which does not appear to be too out of control today which is good news. He does have a hemoglobin A1c of 6.05 May 2019. They currently have been using a calcium alginate dressing to the sacrum and it sounds like Skin-Prep and a foam on the heel. 4/9; pressure ulcers on the sacral area and left heel. Been using Anasept wet-to-dry on the sacrum and Hydrofera Blue in the heel. He has home health family is helping with the dressing. It sounds as though they are fairly religious about offloading these areas that have pressure relief surfaces for  the bed. Wounds look better 4/30; I have not seen this patient in 3 weeks. We have been using Anasept wet-to-dry the sacrum and Hydrofera Blue on the heel. When I saw this 3 weeks ago the sacrum actually look quite good and I thought that we might be able to transition him to a collagen-based dressing today. 5/10; the since the patient was last here he was hospitalized from 5/5 through 12/07/2019. He was noted to have a pressure injury of the sacral region stage IV. MRI did not suggest discitis or osteomyelitis. There was nonspecific bilateral paraspinal muscle edema. Culture I did when he was here the last time showed E. coli I believe they are aware of this. Blood cultures were negative. His white count was 10.8 on admission 11.9 at discharge his comprehensive metabolic panel notable for an albumin of 2.4 BUN of 32 with creatinine of 0.03 hemoglobin was only 7.3 with a discharge hemoglobin of 8.1 after dropping to a hemoglobin of 6.6 He is back at home. Discharge antibiotic was Augmentin.[name] He was treated with IV Rocephin in the hospital. The patient was seen by palliative care I have not looked at this consult. They have been using dressings that they had left over at home I think most recently [facility]. According to his daughter the patient is eating fairly well Objective Constitutional Sitting or standing Blood Pressure is within target range for patient.. Pulse regular and within target range for patient.[name] Respirations regular, non-labored and within target range.. Temperature is normal and within the target range for the patient.[name] Appears in no distress. Vitals Time Taken: 3:45 PM, Height: 71 in, Weight: 175 lbs, BMI: 24.4, Temperature: 98.2 F, Pulse: 96 bpm, Respiratory Rate: 19 breaths/min, Blood Pressure: 116/57 mmHg. Respiratory work of breathing is normal. Cardiovascular It sounds are normal he does  not appear to be dehydrated. Gastrointestinal (GI) Abdomen is soft and  non-distended without masses or tenderness.[name] Psychiatric Baseline dementia. General Notes: Wound exam; in general the pit the wound looks better than when he was last here. Most of the tissue destruction is in the inferior part of the wound where the wound is still larger and has exposed bone but there is no purulence he still seems tender to palpation around the wound but there is no crepitus. Integumentary (Hair, Skin) Wound #1 status is Open. Original cause of wound was Pressure Injury. The wound is located on the Sacrum. The wound measures 6cm length x 4.2cm width x 3.9cm depth; 19.792cm^2 area and 77.189cm^3 volume. There is bone, muscle, and Fat Layer (Subcutaneous Tissue) Exposed exposed. There is no tunneling noted, however, there is undermining starting at 1:00 and ending at 6:00 with a maximum distance of 3.8cm. There is a large amount of purulent drainage noted. Foul odor after cleansing was noted. The wound margin is well defined and not attached to the wound base. There is medium (34-66%) red granulation within the wound bed. There is a medium (34-66%) amount of necrotic tissue within the wound bed including Adherent Slough. Wound #2 status is Open. Original cause of wound was Pressure Injury. The wound is located on the Left Calcaneus. The wound measures 0.4cm length x 0.5cm width x 0.1cm depth; 0.157cm^2 area and 0.016cm^3 volume. There is Fat Layer (Subcutaneous Tissue) Exposed exposed. There is no tunneling or undermining noted. There is a small amount of serosanguineous drainage noted. The wound margin is flat and intact. There is large (67-100%) red, pink granulation within the wound bed. There is a small (1-33%) amount of necrotic tissue within the wound bed including Adherent Slough. Assessment Active Problems ICD-10 Pressure ulcer of sacral region, stage 4 Type 2 diabetes mellitus with foot ulcer Non-pressure chronic ulcer of other part of left foot with fat layer  exposed Vascular dementia without behavioral disturbance Essential (primary) hypertension Plan Follow-up Appointments: Return Appointment in 2 weeks. - ****[facility] - ROOM 5**** Dressing Change Frequency: Wound #1 Sacrum: Change dressing every day. Wound #2 Left Calcaneus: Change dressing three times week. Skin Barriers/Peri-Wound Care: Wound #1 Sacrum: Skin Prep - to periwound Wound Cleansing: Wound #1 Sacrum: Clean wound with Wound Cleanser Wound #2 Left Calcaneus: Clean wound with Wound Cleanser Primary Wound Dressing: Wound #1 Sacrum: Calcium Alginate with Silver Wound #2 Left Calcaneus: Calcium Alginate with Silver Secondary Dressing: Wound #1 Sacrum: ABD pad - secure with tegaderm to keep dressing from getting soiled Wound #2 Left Calcaneus: Kerlix/Rolled Gauze Dry Gauze Heel Cup Off-Loading: Turn and reposition every 2 hours Other: - float heels with pillows under calves while in bed Home Health: [facility] for wound care. - [facility] 1. At least for this week were going to go back to silver alginate with backing gauze and an ABD 2. Augmentin is reasonable for a pansensitive E. coli in this area will also have the advantage of covering anaerobes 3. [facility] has [facility]. 4. By MRI he did not have osteomyelitis 5. When he is here next time I will have to discuss the implications of an albumin of 2.4 with the daughter. Also see what the palliative care consult in the hospital stated and whether it was discussed with the family. If this is a stable value it is unlikely we will ever heal this wound. 6. At 1 point this was healthy enough 2 weeks ago that I really  felt that we could stimulate some granulation 6. We put silver alginate on the heel which is a very superficial wound and for the moment has no depth. Hopefully it will close over I spent 35 minutes on review of this patient's hospitalization, face-to-face evaluation and preparation  of this record Electronic Signature(s) Signed: 12/11/2019 8:11:21 AM By: Linton Ham MD Entered By: Linton Ham on 12/11/2019 07:45:20 -------------------------------------------------------------------------------- SuperBill Details Patient Name: Date of Service: [name], [name] 12/10/2019 Medical Record Number: [id number] Patient Account Number: [id number] Date of Birth/Sex: Treating RN: 09-03-1921 (86 y.o. [name] Primary Care Provider: Hollace Kinnier Other Clinician: Referring Provider: Treating Provider/Extender: Frederick Peers in Treatment: 7 Diagnosis Coding ICD-10 Codes Code Description [phone] Pressure ulcer of sacral region, stage 4 E11.621 Type 2 diabetes mellitus with foot ulcer L97.522 Non-pressure chronic ulcer of other part of left foot with fat layer exposed F01.50 Vascular dementia without behavioral disturbance I10 Essential (primary) hypertension Facility Procedures CPT4 Code: [id number] Description: 99214 - WOUND CARE VISIT-LEV 4 EST PT Modifier: Quantity: 1 Physician Procedures : CPT4 Code Description Modifier [id number] 99214 - WC PHYS LEVEL 4 - EST PT ICD-10 Diagnosis Description L89.154 Pressure ulcer of sacral region, stage 4 F01.50 Vascular dementia without behavioral disturbance Quantity: 1 Electronic Signature(s) Signed: 12/11/2019 5:17:28 PM By: Levan Hurst RN, BSN Signed: 12/11/2019 6:13:26 PM By: Linton Ham MD Previous Signature: 12/11/2019 8:11:21 AM Version By: Linton Ham MD Entered By: Levan Hurst on 12/11/2019 08:42:56

## 2019-12-12 DIAGNOSIS — I2699 Other pulmonary embolism without acute cor pulmonale: Secondary | ICD-10-CM | POA: Diagnosis not present

## 2019-12-12 DIAGNOSIS — I5032 Chronic diastolic (congestive) heart failure: Secondary | ICD-10-CM | POA: Diagnosis not present

## 2019-12-12 DIAGNOSIS — L97422 Non-pressure chronic ulcer of left heel and midfoot with fat layer exposed: Secondary | ICD-10-CM | POA: Diagnosis not present

## 2019-12-12 DIAGNOSIS — N183 Chronic kidney disease, stage 3 unspecified: Secondary | ICD-10-CM | POA: Diagnosis not present

## 2019-12-12 DIAGNOSIS — E1122 Type 2 diabetes mellitus with diabetic chronic kidney disease: Secondary | ICD-10-CM | POA: Diagnosis not present

## 2019-12-12 DIAGNOSIS — F0391 Unspecified dementia with behavioral disturbance: Secondary | ICD-10-CM | POA: Diagnosis not present

## 2019-12-12 DIAGNOSIS — I130 Hypertensive heart and chronic kidney disease with heart failure and stage 1 through stage 4 chronic kidney disease, or unspecified chronic kidney disease: Secondary | ICD-10-CM | POA: Diagnosis not present

## 2019-12-12 DIAGNOSIS — L89154 Pressure ulcer of sacral region, stage 4: Secondary | ICD-10-CM | POA: Diagnosis not present

## 2019-12-12 DIAGNOSIS — E11621 Type 2 diabetes mellitus with foot ulcer: Secondary | ICD-10-CM | POA: Diagnosis not present

## 2019-12-13 DIAGNOSIS — I5032 Chronic diastolic (congestive) heart failure: Secondary | ICD-10-CM | POA: Diagnosis not present

## 2019-12-13 DIAGNOSIS — E11621 Type 2 diabetes mellitus with foot ulcer: Secondary | ICD-10-CM | POA: Diagnosis not present

## 2019-12-13 DIAGNOSIS — N183 Chronic kidney disease, stage 3 unspecified: Secondary | ICD-10-CM | POA: Diagnosis not present

## 2019-12-13 DIAGNOSIS — L89154 Pressure ulcer of sacral region, stage 4: Secondary | ICD-10-CM | POA: Diagnosis not present

## 2019-12-13 DIAGNOSIS — L97422 Non-pressure chronic ulcer of left heel and midfoot with fat layer exposed: Secondary | ICD-10-CM | POA: Diagnosis not present

## 2019-12-13 DIAGNOSIS — I130 Hypertensive heart and chronic kidney disease with heart failure and stage 1 through stage 4 chronic kidney disease, or unspecified chronic kidney disease: Secondary | ICD-10-CM | POA: Diagnosis not present

## 2019-12-13 DIAGNOSIS — F0391 Unspecified dementia with behavioral disturbance: Secondary | ICD-10-CM | POA: Diagnosis not present

## 2019-12-13 DIAGNOSIS — E1122 Type 2 diabetes mellitus with diabetic chronic kidney disease: Secondary | ICD-10-CM | POA: Diagnosis not present

## 2019-12-13 DIAGNOSIS — I2699 Other pulmonary embolism without acute cor pulmonale: Secondary | ICD-10-CM | POA: Diagnosis not present

## 2019-12-14 ENCOUNTER — Encounter: Payer: Self-pay | Admitting: Family

## 2019-12-17 DIAGNOSIS — L89154 Pressure ulcer of sacral region, stage 4: Secondary | ICD-10-CM | POA: Diagnosis not present

## 2019-12-17 DIAGNOSIS — I5032 Chronic diastolic (congestive) heart failure: Secondary | ICD-10-CM | POA: Diagnosis not present

## 2019-12-17 DIAGNOSIS — F0391 Unspecified dementia with behavioral disturbance: Secondary | ICD-10-CM | POA: Diagnosis not present

## 2019-12-17 DIAGNOSIS — I130 Hypertensive heart and chronic kidney disease with heart failure and stage 1 through stage 4 chronic kidney disease, or unspecified chronic kidney disease: Secondary | ICD-10-CM | POA: Diagnosis not present

## 2019-12-17 DIAGNOSIS — I2699 Other pulmonary embolism without acute cor pulmonale: Secondary | ICD-10-CM | POA: Diagnosis not present

## 2019-12-17 DIAGNOSIS — E11621 Type 2 diabetes mellitus with foot ulcer: Secondary | ICD-10-CM | POA: Diagnosis not present

## 2019-12-17 DIAGNOSIS — L97422 Non-pressure chronic ulcer of left heel and midfoot with fat layer exposed: Secondary | ICD-10-CM | POA: Diagnosis not present

## 2019-12-17 DIAGNOSIS — N183 Chronic kidney disease, stage 3 unspecified: Secondary | ICD-10-CM | POA: Diagnosis not present

## 2019-12-17 DIAGNOSIS — E1122 Type 2 diabetes mellitus with diabetic chronic kidney disease: Secondary | ICD-10-CM | POA: Diagnosis not present

## 2019-12-20 DIAGNOSIS — I2699 Other pulmonary embolism without acute cor pulmonale: Secondary | ICD-10-CM | POA: Diagnosis not present

## 2019-12-20 DIAGNOSIS — I130 Hypertensive heart and chronic kidney disease with heart failure and stage 1 through stage 4 chronic kidney disease, or unspecified chronic kidney disease: Secondary | ICD-10-CM | POA: Diagnosis not present

## 2019-12-20 DIAGNOSIS — E11621 Type 2 diabetes mellitus with foot ulcer: Secondary | ICD-10-CM | POA: Diagnosis not present

## 2019-12-20 DIAGNOSIS — E1122 Type 2 diabetes mellitus with diabetic chronic kidney disease: Secondary | ICD-10-CM | POA: Diagnosis not present

## 2019-12-20 DIAGNOSIS — F0391 Unspecified dementia with behavioral disturbance: Secondary | ICD-10-CM | POA: Diagnosis not present

## 2019-12-20 DIAGNOSIS — I5032 Chronic diastolic (congestive) heart failure: Secondary | ICD-10-CM | POA: Diagnosis not present

## 2019-12-20 DIAGNOSIS — L89154 Pressure ulcer of sacral region, stage 4: Secondary | ICD-10-CM | POA: Diagnosis not present

## 2019-12-20 DIAGNOSIS — N183 Chronic kidney disease, stage 3 unspecified: Secondary | ICD-10-CM | POA: Diagnosis not present

## 2019-12-20 DIAGNOSIS — L97422 Non-pressure chronic ulcer of left heel and midfoot with fat layer exposed: Secondary | ICD-10-CM | POA: Diagnosis not present

## 2019-12-24 DIAGNOSIS — N183 Chronic kidney disease, stage 3 unspecified: Secondary | ICD-10-CM | POA: Diagnosis not present

## 2019-12-24 DIAGNOSIS — E11621 Type 2 diabetes mellitus with foot ulcer: Secondary | ICD-10-CM | POA: Diagnosis not present

## 2019-12-24 DIAGNOSIS — I130 Hypertensive heart and chronic kidney disease with heart failure and stage 1 through stage 4 chronic kidney disease, or unspecified chronic kidney disease: Secondary | ICD-10-CM | POA: Diagnosis not present

## 2019-12-24 DIAGNOSIS — I5032 Chronic diastolic (congestive) heart failure: Secondary | ICD-10-CM | POA: Diagnosis not present

## 2019-12-24 DIAGNOSIS — E1122 Type 2 diabetes mellitus with diabetic chronic kidney disease: Secondary | ICD-10-CM | POA: Diagnosis not present

## 2019-12-24 DIAGNOSIS — F0391 Unspecified dementia with behavioral disturbance: Secondary | ICD-10-CM | POA: Diagnosis not present

## 2019-12-24 DIAGNOSIS — L89154 Pressure ulcer of sacral region, stage 4: Secondary | ICD-10-CM | POA: Diagnosis not present

## 2019-12-24 DIAGNOSIS — L97422 Non-pressure chronic ulcer of left heel and midfoot with fat layer exposed: Secondary | ICD-10-CM | POA: Diagnosis not present

## 2019-12-24 DIAGNOSIS — I2699 Other pulmonary embolism without acute cor pulmonale: Secondary | ICD-10-CM | POA: Diagnosis not present

## 2019-12-25 ENCOUNTER — Other Ambulatory Visit: Payer: Self-pay

## 2019-12-25 NOTE — Patient Outreach (Signed)
[facility]) Care Management  [place]  12/25/2019   [name] 11/10/1921 [id number]  Subjective: Telephone call to daughter [name]. She reports that her dad is doing ok.  She reports that home health continues to come for wound care and report that wound is ok.  Discussed signs of infection.  Patient continues to also visit the wound care center.  She states however that patient has an appointment with the [facility] on 01-03-20 and that patient would not be seeing Dr. Mariea [name] any longer.  She states that patient blood sugar varies but running around 140. Discussed diabetic control. Discussed continuation of keeping patient comfortable and providing good nutrition to promote wound healing. She verbalized understanding but states sometimes patient will eat and other times not.     Objective:   Encounter Medications:  Outpatient Encounter Medications as of 12/25/2019  Medication Sig  . acetaminophen (TYLENOL) 500 MG tablet Take 500 mg by mouth every 6 (six) hours as needed for moderate pain.  [name] albuterol (VENTOLIN HFA) 108 (90 Base) MCG/ACT inhaler Inhale 2 puffs into the lungs every 4 (four) hours as needed for wheezing or shortness of breath. INHALE TWO PUFFS EVERY 4-6 HOURS AS NEEDED FOR DIFFICULTY BREATHING,WHEEZING OR COUGH (Patient taking differently: Inhale 2 puffs into the lungs every 4 (four) hours as needed for wheezing or shortness of breath. )  . Alcohol Swabs (SURE-PREP ALCOHOL PREP) 70 % PADS 1 each by Does not apply route as needed.  [name] amLODipine (NORVASC) 5 MG tablet Take 1 tablet (5 mg total) by mouth daily.  [name] apixaban (ELIQUIS) 5 MG TABS tablet Take 1 tablet (5 mg total) by mouth 2 (two) times daily.  [name] aspirin EC 81 MG tablet Take 1 tablet (81 mg total) by mouth daily.  . Blood Glucose Monitoring Suppl (ACCU-CHEK AVIVA) device Use to test blood sugar three times daily. Dx E11.51  . escitalopram (LEXAPRO) 5 MG tablet TAKE 1 TABLET EVERY DAY (Patient taking  differently: Take 5 mg by mouth daily. )  . feeding supplement, ENSURE ENLIVE, (ENSURE ENLIVE) LIQD Take 237 mLs by mouth 2 (two) times daily between meals.  [name] glucose blood (ACCU-CHEK AVIVA) test strip Use to test blood sugar three times daily. Dx: E11.51  . hydrochlorothiazide (HYDRODIURIL) 25 MG tablet TAKE 1 TABLET EVERY DAY (Patient taking differently: Take 25 mg by mouth daily. )  . insulin glargine (LANTUS) 100 UNIT/ML injection Inject 8 Units into the skin daily.   . Insulin Pen Needle (B-D UF III MINI PEN NEEDLES) 31G X 5 MM MISC USE AS DIRECTED.  Elmore Guise Devices (ACCU-CHEK SOFTCLIX) lancets Use as instructed with checking blood sugar three times daily. Dx: E11.51  . Loratadine 10 MG CAPS Take 1 capsule (10 mg total) by mouth daily.  . Multiple Vitamins-Minerals (SENTRY SENIOR) TABS Take 1 tablet by mouth daily.  [name] NAMZARIC 28-10 MG CP24 TAKE 1 CAPSULE EVERY DAY (Patient taking differently: Take 1 capsule by mouth daily. )  . simvastatin (ZOCOR) 20 MG tablet Take 1 tablet (20 mg total) by mouth daily.  . tamsulosin (FLOMAX) 0.4 MG CAPS capsule Take 1 capsule (0.4 mg total) by mouth daily after supper.   No facility-administered encounter medications on file as of 12/25/2019.    Functional Status:  In your present state of health, do you have any difficulty performing the following activities: 12/10/2019 07/16/2019  Hearing? N N  Vision? N N  Difficulty concentrating or making decisions? [name]  Comment patient  has dementia Patient needs assistance with all aspects of care.  Walking or climbing stairs? Y Y  Comment bed bound -  Dressing or bathing? [name]  Comment total care -  Doing errands, shopping? [name]  Preparing Food and eating ? [name]  Comment family assists with food. needs help with all aspects of care.  Using the Toilet? Y Y  Comment patient incontinent -  In the past six months, have you accidently leaked urine? (No Data) Y  Comment incontinent -  Do you have problems with  loss of bowel control? Y Y  Comment incontinent -  Managing your Medications? [name]  Comment daughter assists -  Managing your Finances? [name]  Comment daughter handles. -  Housekeeping or managing your Housekeeping? [name]  Comment daughter handles -  Some recent data might be hidden    Fall/Depression Screening: Fall Risk  12/10/2019 12/10/2019 11/13/2019  Falls in the past year? 0 0 0  Number falls in past yr: - - -  Injury with Fall? - - -  Comment - - -  Risk Factor Category  - - -  Risk for fall due to : - - -  Follow up - - -   PHQ 2/9 Scores 12/10/2019 11/09/2019 10/03/2019 07/16/2019 07/16/2019 01/22/2019 12/01/2018  PHQ - 2 Score - 0 0 - - 0 0  Exception Documentation Medical reason - - Other- indicate reason in comment box Other- indicate reason in comment box - -  Not completed - - - - unable to assess - -    Assessment: Wound care continues with patient. Daughter attempting to provide healthy diet when patient does eat.  Diabetic control continues.    Plan:  [facility] CM Care Plan Problem One     Most Recent Value  Care Plan Problem One  Knowledge deficit diabetes management  Role Documenting the Problem One  Care Management Telephonic Coordinator  Care Plan for Problem One  Active  [facility] Long Term Goal   Patient A1c will remain less than 6.5 within 90 days.  [facility] Long Term Goal Start Date  11/13/19  Interventions for Problem One Long Term Goal  Daughter checking blood sugar with last report around 140. Discussed importance of blood sugar control.      [facility] CM Care Plan Problem Two     Most Recent Value  Care Plan Problem Two  Recent hospitalization -stage 4 sacral wound  Role Documenting the Problem Two  Care Management Telephonic [place] for Problem Two  Active  Interventions for Problem Two Long Term Goal   Patient continues to have home helath visiting for wound care and patient visiting the wound center.  Daughter monitoring for signs of wound infection.    [facility] Long  Term Goal  Patient will not readmit to hospital within 31 days.    [facility] Long Term Goal Start Date  12/10/19  [facility] CM Short Term Goal #1   Patient will follow up with wond care center within 14 days.  [facility] CM Short Term Goal #1 Start Date  12/10/19  [facility] CM Short Term Goal #1 Met Date   12/25/19  [facility] CM Short Term Goal #2   Daughter will be able to verbalize signs of wound infection within 14 days.    [facility] CM Short Term Goal #2 Start Date  12/10/19  [facility] CM Short Term Goal #2 Met Date  12/25/19  Interventions for Short Term Goal #2  RN  CM reiterated signs of wound infection to include increased drainage, odor, fever, and chills.       RN CM will outreach again in the month of June and daughter agreeable.    Jone Baseman, RN, MSN [facility] Management Care Management Coordinator Direct Line [phone] Cell [phone] Toll Free: [phone]  Fax: [phone]

## 2019-12-26 DIAGNOSIS — I5032 Chronic diastolic (congestive) heart failure: Secondary | ICD-10-CM | POA: Diagnosis not present

## 2019-12-26 DIAGNOSIS — E1122 Type 2 diabetes mellitus with diabetic chronic kidney disease: Secondary | ICD-10-CM | POA: Diagnosis not present

## 2019-12-26 DIAGNOSIS — E11621 Type 2 diabetes mellitus with foot ulcer: Secondary | ICD-10-CM | POA: Diagnosis not present

## 2019-12-26 DIAGNOSIS — N183 Chronic kidney disease, stage 3 unspecified: Secondary | ICD-10-CM | POA: Diagnosis not present

## 2019-12-26 DIAGNOSIS — L89154 Pressure ulcer of sacral region, stage 4: Secondary | ICD-10-CM | POA: Diagnosis not present

## 2019-12-26 DIAGNOSIS — F0391 Unspecified dementia with behavioral disturbance: Secondary | ICD-10-CM | POA: Diagnosis not present

## 2019-12-26 DIAGNOSIS — I130 Hypertensive heart and chronic kidney disease with heart failure and stage 1 through stage 4 chronic kidney disease, or unspecified chronic kidney disease: Secondary | ICD-10-CM | POA: Diagnosis not present

## 2019-12-26 DIAGNOSIS — I2699 Other pulmonary embolism without acute cor pulmonale: Secondary | ICD-10-CM | POA: Diagnosis not present

## 2019-12-26 DIAGNOSIS — L97422 Non-pressure chronic ulcer of left heel and midfoot with fat layer exposed: Secondary | ICD-10-CM | POA: Diagnosis not present

## 2019-12-27 ENCOUNTER — Other Ambulatory Visit: Payer: Self-pay | Admitting: Family

## 2019-12-27 DIAGNOSIS — I130 Hypertensive heart and chronic kidney disease with heart failure and stage 1 through stage 4 chronic kidney disease, or unspecified chronic kidney disease: Secondary | ICD-10-CM | POA: Diagnosis not present

## 2019-12-27 DIAGNOSIS — F0391 Unspecified dementia with behavioral disturbance: Secondary | ICD-10-CM | POA: Diagnosis not present

## 2019-12-27 DIAGNOSIS — I2699 Other pulmonary embolism without acute cor pulmonale: Secondary | ICD-10-CM | POA: Diagnosis not present

## 2019-12-27 DIAGNOSIS — E1122 Type 2 diabetes mellitus with diabetic chronic kidney disease: Secondary | ICD-10-CM | POA: Diagnosis not present

## 2019-12-27 DIAGNOSIS — I5032 Chronic diastolic (congestive) heart failure: Secondary | ICD-10-CM | POA: Diagnosis not present

## 2019-12-27 DIAGNOSIS — N183 Chronic kidney disease, stage 3 unspecified: Secondary | ICD-10-CM

## 2019-12-27 DIAGNOSIS — L97422 Non-pressure chronic ulcer of left heel and midfoot with fat layer exposed: Secondary | ICD-10-CM | POA: Diagnosis not present

## 2019-12-27 DIAGNOSIS — E11621 Type 2 diabetes mellitus with foot ulcer: Secondary | ICD-10-CM | POA: Diagnosis not present

## 2019-12-27 DIAGNOSIS — L89154 Pressure ulcer of sacral region, stage 4: Secondary | ICD-10-CM | POA: Diagnosis not present

## 2019-12-28 ENCOUNTER — Other Ambulatory Visit: Payer: Self-pay

## 2019-12-28 ENCOUNTER — Encounter (HOSPITAL_BASED_OUTPATIENT_CLINIC_OR_DEPARTMENT_OTHER): Payer: Medicare HMO | Admitting: Internal Medicine

## 2019-12-28 DIAGNOSIS — E1122 Type 2 diabetes mellitus with diabetic chronic kidney disease: Secondary | ICD-10-CM

## 2019-12-28 DIAGNOSIS — H42 Glaucoma in diseases classified elsewhere: Secondary | ICD-10-CM | POA: Diagnosis not present

## 2019-12-28 DIAGNOSIS — E1139 Type 2 diabetes mellitus with other diabetic ophthalmic complication: Secondary | ICD-10-CM | POA: Diagnosis not present

## 2019-12-28 DIAGNOSIS — L97522 Non-pressure chronic ulcer of other part of left foot with fat layer exposed: Secondary | ICD-10-CM | POA: Diagnosis not present

## 2019-12-28 DIAGNOSIS — F0150 Vascular dementia without behavioral disturbance: Secondary | ICD-10-CM | POA: Diagnosis not present

## 2019-12-28 DIAGNOSIS — E11621 Type 2 diabetes mellitus with foot ulcer: Secondary | ICD-10-CM | POA: Diagnosis not present

## 2019-12-28 DIAGNOSIS — L89154 Pressure ulcer of sacral region, stage 4: Secondary | ICD-10-CM | POA: Diagnosis not present

## 2019-12-28 DIAGNOSIS — J449 Chronic obstructive pulmonary disease, unspecified: Secondary | ICD-10-CM | POA: Diagnosis not present

## 2019-12-28 DIAGNOSIS — E1151 Type 2 diabetes mellitus with diabetic peripheral angiopathy without gangrene: Secondary | ICD-10-CM | POA: Diagnosis not present

## 2019-12-28 DIAGNOSIS — I10 Essential (primary) hypertension: Secondary | ICD-10-CM | POA: Diagnosis not present

## 2019-12-28 DIAGNOSIS — R6889 Other general symptoms and signs: Secondary | ICD-10-CM | POA: Diagnosis not present

## 2019-12-28 MED ORDER — ACCU-CHEK SOFTCLIX LANCETS MISC: 3 refills | Status: AC

## 2019-12-28 MED ORDER — BD PEN NEEDLE MINI U/F 31G X 5 MM MISC: 11 refills | Status: AC

## 2019-12-28 MED ORDER — SURE-PREP ALCOHOL PREP 70 % PADS: 1.0000 | MEDICATED_PAD | 3 refills | Status: AC | PRN

## 2019-12-28 NOTE — Telephone Encounter (Signed)
[facility] 

## 2019-12-28 NOTE — Progress Notes (Signed)
WALLACE, ERHARD ([id number]) Visit Report for 12/28/2019 HPI Details Patient Name: Date of Service: NOEH, DUFFEK 12/28/2019 2:45 PM Medical Record Number: [id number] Patient Account Number: [id number] Date of Birth/Sex: Treating RN: September 27, 1921 (86 y.o. Marvis Repress Primary Care Provider: Hollace Kinnier Other Clinician: Referring Provider: Treating Provider/Extender: Katrinka Blazing in Treatment: 10 History of Present Illness HPI Description: 10/17/2019 upon evaluation today patient presents for initial inspection here in our clinic concerning issues that has been having with a wound in the sacral region as well as the left heel. Fortunately neither appears to be overtly and significantly infected there is some necrotic tissue noted at both locations however that is going to be required to be addressed. He does have a history of diabetes mellitus type 2, vascular dementia, and hypertension. The patient did have a stroke in November 2020 unfortunately he has not been the same since that time according to his daughter. Prior to that he was taking care of himself now he is 100% dependent. He is at home with his daughter who is present during the office visit today as well that is where I get the majority of the history from at this point. Patient does have a history of hypertension as well which does not appear to be too out of control today which is good news. He does have a hemoglobin A1c of 6.05 May 2019. They currently have been using a calcium alginate dressing to the sacrum and it sounds like Skin-Prep and a foam on the heel. 4/9; pressure ulcers on the sacral area and left heel. Been using Anasept wet-to-dry on the sacrum and Hydrofera Blue in the heel. He has home health family is helping with the dressing. It sounds as though they are fairly religious about offloading these areas that have pressure relief surfaces for the bed. Wounds look better 4/30; I have  not seen this patient in 3 weeks. We have been using Anasept wet-to-dry the sacrum and Hydrofera Blue on the heel. When I saw this 3 weeks ago the sacrum actually look quite good and I thought that we might be able to transition him to a collagen-based dressing today. 5/10; the since the patient was last here he was hospitalized from 5/5 through 12/07/2019. He was noted to have a pressure injury of the sacral region stage IV. MRI did not suggest discitis or osteomyelitis. There was nonspecific bilateral paraspinal muscle edema. Culture I did when he was here the last time showed E. coli I believe they are aware of this. Blood cultures were negative. His white count was 10.8 on admission 11.9 at discharge his comprehensive metabolic panel notable for an albumin of 2.4 BUN of 32 with creatinine of 0.03 hemoglobin was only 7.3 with a discharge hemoglobin of 8.1 after dropping to a hemoglobin of 6.6 He is back at home. Discharge antibiotic was Augmentin.[name] He was treated with IV Rocephin in the hospital. The patient was seen by palliative care I have not looked at this consult. They have been using dressings that they had left over at home I think most recently [facility]. According to his daughter the patient is eating fairly well 12/28/19-Patient is back at 2 weeks, the sacral wound is about the same, the right heel has healed and closed. We are using silver alginate to the sacral wound Electronic Signature(s) Signed: 12/28/2019 2:56:01 PM By: Tobi Bastos MD, MBA Entered By: Tobi Bastos on 12/28/2019 14:56:00 -------------------------------------------------------------------------------- Physical Exam Details Patient Name: Date of  Service: SESAR, BOSSIO 12/28/2019 2:45 PM Medical Record Number: [id number] Patient Account Number: [id number] Date of Birth/Sex: Treating RN: Jan 19, 1922 (86 y.o. Marvis Repress Primary Care Provider: Hollace Kinnier Other Clinician: Referring  Provider: Treating Provider/Extender: Katrinka Blazing in Treatment: 10 Constitutional alert and oriented x 3. sitting or standing blood pressure is within target range for patient.. supine blood pressure is within target range for patient.. pulse regular and within target range for patient.[name] respirations regular, non-labored and within target range for patient.[name] temperature within target range for patient.. . . Well- nourished and well-hydrated in no acute distress. Notes Sacral wound with undermining on the lateral surface right side of the sacrum, there is no obvious necrotic tissue noted in the visible portion of the wound Electronic Signature(s) Signed: 12/28/2019 2:56:38 PM By: Tobi Bastos MD, MBA Entered By: Tobi Bastos on 12/28/2019 14:56:37 -------------------------------------------------------------------------------- Physician Orders Details Patient Name: Date of Service: [facility] TABITHA, BALLENGEE 12/28/2019 2:45 PM Medical Record Number: [id number] Patient Account Number: [id number] Date of Birth/Sex: Treating RN: 26-Sep-1921 (86 y.o. Marvis Repress Primary Care Provider: Hollace Kinnier Other Clinician: Referring Provider: Treating Provider/Extender: Katrinka Blazing in Treatment: 10 Verbal / Phone Orders: No Diagnosis Coding ICD-10 Coding Code Description L89.154 Pressure ulcer of sacral region, stage 4 E11.621 Type 2 diabetes mellitus with foot ulcer L97.522 Non-pressure chronic ulcer of other part of left foot with fat layer exposed F01.50 Vascular dementia without behavioral disturbance I10 Essential (primary) hypertension Follow-up Appointments Return appointment in 3 weeks. - ****[facility] - ROOM 5**** Dressing Change Frequency Wound #1 Sacrum Change dressing every day. Skin Barriers/Peri-Wound Care Wound #1 Sacrum Skin Prep - to periwound Wound Cleansing Wound #1 Sacrum Clean wound with Wound Cleanser Primary Wound  Dressing Wound #1 Sacrum Calcium Alginate with Silver Secondary Dressing Wound #1 Sacrum ABD pad - secure with tegaderm to keep dressing from getting soiled Off-Loading Turn and reposition every 2 hours Other: - float heels with pillows under calves while in bed [facility] for wound care. - [facility] Electronic Signature(s) Signed: 12/28/2019 4:30:26 PM By: Tobi Bastos MD, MBA Signed: 12/28/2019 4:59:31 PM By: Kela Millin Entered By: Kela Millin on 12/28/2019 14:53:07 -------------------------------------------------------------------------------- Problem List Details Patient Name: Date of Service: [facility] [place] ANASTASIA, DEHOOG 12/28/2019 2:45 PM Medical Record Number: [id number] Patient Account Number: [id number] Date of Birth/Sex: Treating RN: 10/28/1921 (86 y.o. Marvis Repress Primary Care Provider: Hollace Kinnier Other Clinician: Referring Provider: Treating Provider/Extender: Katrinka Blazing in Treatment: 10 Active Problems ICD-10 Encounter Code Description Active Date MDM Diagnosis L89.154 Pressure ulcer of sacral region, stage 4 10/17/2019 No Yes E11.621 Type 2 diabetes mellitus with foot ulcer 10/17/2019 No Yes L97.522 Non-pressure chronic ulcer of other part of left foot with fat layer exposed 10/17/2019 No Yes F01.50 Vascular dementia without behavioral disturbance 10/17/2019 No Yes I10 Essential (primary) hypertension 10/17/2019 No Yes Inactive Problems Resolved Problems Electronic Signature(s) Signed: 12/28/2019 4:30:26 PM By: Tobi Bastos MD, MBA Signed: 12/28/2019 4:59:31 PM By: Kela Millin Entered By: Kela Millin on 12/28/2019 14:52:14 -------------------------------------------------------------------------------- Progress Note Details Patient Name: Date of Service: [facility] JULIA, DEC 12/28/2019 2:45 PM Medical Record Number: [id number] Patient Account Number: [id number] Date of  Birth/Sex: Treating RN: [birth year] (86 y.o. Marvis Repress Primary Care Provider: Hollace Kinnier Other Clinician: Referring Provider: Treating Provider/Extender: Katrinka Blazing in Treatment: 10 Subjective History of Present Illness (HPI) 10/17/2019 upon evaluation today patient presents for initial  inspection here in our clinic concerning issues that has been having with a wound in the sacral region as well as the left heel. Fortunately neither appears to be overtly and significantly infected there is some necrotic tissue noted at both locations however that is going to be required to be addressed. He does have a history of diabetes mellitus type 2, vascular dementia, and hypertension. The patient did have a stroke in November 2020 unfortunately he has not been the same since that time according to his daughter. Prior to that he was taking care of himself now he is 100% dependent. He is at home with his daughter who is present during the office visit today as well that is where I get the majority of the history from at this point. Patient does have a history of hypertension as well which does not appear to be too out of control today which is good news. He does have a hemoglobin A1c of 6.05 May 2019. They currently have been using a calcium alginate dressing to the sacrum and it sounds like Skin-Prep and a foam on the heel. 4/9; pressure ulcers on the sacral area and left heel. Been using Anasept wet-to-dry on the sacrum and Hydrofera Blue in the heel. He has home health family is helping with the dressing. It sounds as though they are fairly religious about offloading these areas that have pressure relief surfaces for the bed. Wounds look better 4/30; I have not seen this patient in 3 weeks. We have been using Anasept wet-to-dry the sacrum and Hydrofera Blue on the heel. When I saw this 3 weeks ago the sacrum actually look quite good and I thought that we might be  able to transition him to a collagen-based dressing today. 5/10; the since the patient was last here he was hospitalized from 5/5 through 12/07/2019. He was noted to have a pressure injury of the sacral region stage IV. MRI did not suggest discitis or osteomyelitis. There was nonspecific bilateral paraspinal muscle edema. Culture I did when he was here the last time showed E. coli I believe they are aware of this. Blood cultures were negative. His white count was 10.8 on admission 11.9 at discharge his comprehensive metabolic panel notable for an albumin of 2.4 BUN of 32 with creatinine of 0.03 hemoglobin was only 7.3 with a discharge hemoglobin of 8.1 after dropping to a hemoglobin of 6.6 He is back at home. Discharge antibiotic was Augmentin.[name] He was treated with IV Rocephin in the hospital. The patient was seen by palliative care I have not looked at this consult. They have been using dressings that they had left over at home I think most recently [facility]. According to his daughter the patient is eating fairly well 12/28/19-Patient is back at 2 weeks, the sacral wound is about the same, the right heel has healed and closed. We are using silver alginate to the sacral wound Objective Constitutional alert and oriented x 3. sitting or standing blood pressure is within target range for patient.. supine blood pressure is within target range for patient.. pulse regular and within target range for patient.[name] respirations regular, non-labored and within target range for patient.[name] temperature within target range for patient.. Well- nourished and well-hydrated in no acute distress. Vitals Time Taken: 2:29 PM, Height: 71 in, Weight: 175 lbs, BMI: 24.4, Temperature: 97.5 F, Pulse: 100 bpm, Respiratory Rate: 18 breaths/min, Blood Pressure: 111/53 mmHg. General Notes: Sacral wound with undermining on the lateral surface right side of the  sacrum, there is no obvious necrotic tissue noted in the visible  portion of the wound Integumentary (Hair, Skin) Wound #1 status is Open. Original cause of wound was Pressure Injury. The wound is located on the Sacrum. The wound measures 5.3cm length x 2.7cm width x 2.5cm depth; 11.239cm^2 area and 28.098cm^3 volume. There is bone, muscle, and Fat Layer (Subcutaneous Tissue) Exposed exposed. There is no tunneling noted, however, there is undermining starting at 12:00 and ending at 4:00 with a maximum distance of 4.4cm. There is a medium amount of serosanguineous drainage noted. The wound margin is well defined and not attached to the wound base. There is large (67-100%) red granulation within the wound bed. There is a small (1-33%) amount of necrotic tissue within the wound bed including Adherent Slough. Wound #2 status is Healed - Epithelialized. Original cause of wound was Pressure Injury. The wound is located on the Left Calcaneus. The wound measures 0cm length x 0cm width x 0cm depth; 0cm^2 area and 0cm^3 volume. There is no tunneling or undermining noted. There is a none present amount of drainage noted. The wound margin is flat and intact. There is no granulation within the wound bed. There is no necrotic tissue within the wound bed. Assessment Active Problems ICD-10 Pressure ulcer of sacral region, stage 4 Type 2 diabetes mellitus with foot ulcer Non-pressure chronic ulcer of other part of left foot with fat layer exposed Vascular dementia without behavioral disturbance Essential (primary) hypertension Plan Follow-up Appointments: Return appointment in 3 weeks. - ****[facility] - ROOM 5**** Dressing Change Frequency: Wound #1 Sacrum: Change dressing every day. Skin Barriers/Peri-Wound Care: Wound #1 Sacrum: Skin Prep - to periwound Wound Cleansing: Wound #1 Sacrum: Clean wound with Wound Cleanser Primary Wound Dressing: Wound #1 Sacrum: Calcium Alginate with Silver Secondary Dressing: Wound #1 Sacrum: ABD pad - secure with tegaderm to keep  dressing from getting soiled Off-Loading: Turn and reposition every 2 hours Other: - float heels with pillows under calves while in bed Home Health: [facility] for wound care. - [facility] -Continue silver alginate packing to the sacral wound, according to the daughter patient is improving on his intake but given that he has a new set of teeth has some difficulty with getting used to the chewing part -Return to clinic in 3 weeks Electronic Signature(s) Signed: 12/28/2019 2:57:25 PM By: Tobi Bastos MD, MBA Entered By: Tobi Bastos on 12/28/2019 14:57:25 -------------------------------------------------------------------------------- SuperBill Details Patient Name: Date of Service: Jeb Levering 12/28/2019 Medical Record Number: [id number] Patient Account Number: [id number] Date of Birth/Sex: Treating RN: 08/21/1921 (86 y.o. Marvis Repress Primary Care Provider: Hollace Kinnier Other Clinician: Referring Provider: Treating Provider/Extender: Katrinka Blazing in Treatment: 10 Diagnosis Coding ICD-10 Codes Code Description [phone] Pressure ulcer of sacral region, stage 4 E11.621 Type 2 diabetes mellitus with foot ulcer L97.522 Non-pressure chronic ulcer of other part of left foot with fat layer exposed F01.50 Vascular dementia without behavioral disturbance I10 Essential (primary) hypertension Facility Procedures CPT4 Code: [id number] Description: 99213 - WOUND CARE VISIT-LEV 3 EST PT Modifier: Quantity: 1 Physician Procedures : CPT4 Code Description Modifier [id number] - WC PHYS LEVEL 3 - EST PT ICD-10 Diagnosis Description L89.154 Pressure ulcer of sacral region, stage 4 Quantity: 1 Electronic Signature(s) Signed: 12/28/2019 2:57:39 PM By: Tobi Bastos MD, MBA Entered By: Tobi Bastos on 12/28/2019 14:57:39

## 2020-01-02 DIAGNOSIS — I130 Hypertensive heart and chronic kidney disease with heart failure and stage 1 through stage 4 chronic kidney disease, or unspecified chronic kidney disease: Secondary | ICD-10-CM | POA: Diagnosis not present

## 2020-01-02 DIAGNOSIS — L89626 Pressure-induced deep tissue damage of left heel: Secondary | ICD-10-CM | POA: Diagnosis not present

## 2020-01-02 DIAGNOSIS — M6281 Muscle weakness (generalized): Secondary | ICD-10-CM | POA: Diagnosis not present

## 2020-01-02 DIAGNOSIS — I5032 Chronic diastolic (congestive) heart failure: Secondary | ICD-10-CM | POA: Diagnosis not present

## 2020-01-02 DIAGNOSIS — I2699 Other pulmonary embolism without acute cor pulmonale: Secondary | ICD-10-CM | POA: Diagnosis not present

## 2020-01-02 DIAGNOSIS — E1122 Type 2 diabetes mellitus with diabetic chronic kidney disease: Secondary | ICD-10-CM | POA: Diagnosis not present

## 2020-01-02 DIAGNOSIS — F0391 Unspecified dementia with behavioral disturbance: Secondary | ICD-10-CM | POA: Diagnosis not present

## 2020-01-02 DIAGNOSIS — L89154 Pressure ulcer of sacral region, stage 4: Secondary | ICD-10-CM | POA: Diagnosis not present

## 2020-01-02 DIAGNOSIS — N183 Chronic kidney disease, stage 3 unspecified: Secondary | ICD-10-CM | POA: Diagnosis not present

## 2020-01-03 DIAGNOSIS — R2689 Other abnormalities of gait and mobility: Secondary | ICD-10-CM | POA: Diagnosis not present

## 2020-01-03 DIAGNOSIS — L89153 Pressure ulcer of sacral region, stage 3: Secondary | ICD-10-CM | POA: Diagnosis not present

## 2020-01-03 DIAGNOSIS — R6889 Other general symptoms and signs: Secondary | ICD-10-CM | POA: Diagnosis not present

## 2020-01-04 DIAGNOSIS — M6281 Muscle weakness (generalized): Secondary | ICD-10-CM | POA: Diagnosis not present

## 2020-01-04 DIAGNOSIS — L89154 Pressure ulcer of sacral region, stage 4: Secondary | ICD-10-CM | POA: Diagnosis not present

## 2020-01-04 DIAGNOSIS — I130 Hypertensive heart and chronic kidney disease with heart failure and stage 1 through stage 4 chronic kidney disease, or unspecified chronic kidney disease: Secondary | ICD-10-CM | POA: Diagnosis not present

## 2020-01-04 DIAGNOSIS — E1122 Type 2 diabetes mellitus with diabetic chronic kidney disease: Secondary | ICD-10-CM | POA: Diagnosis not present

## 2020-01-04 DIAGNOSIS — F0391 Unspecified dementia with behavioral disturbance: Secondary | ICD-10-CM | POA: Diagnosis not present

## 2020-01-04 DIAGNOSIS — I2699 Other pulmonary embolism without acute cor pulmonale: Secondary | ICD-10-CM | POA: Diagnosis not present

## 2020-01-04 DIAGNOSIS — N183 Chronic kidney disease, stage 3 unspecified: Secondary | ICD-10-CM | POA: Diagnosis not present

## 2020-01-04 DIAGNOSIS — I5032 Chronic diastolic (congestive) heart failure: Secondary | ICD-10-CM | POA: Diagnosis not present

## 2020-01-04 DIAGNOSIS — L89626 Pressure-induced deep tissue damage of left heel: Secondary | ICD-10-CM | POA: Diagnosis not present

## 2020-01-08 DIAGNOSIS — U071 COVID-19: Secondary | ICD-10-CM | POA: Diagnosis not present

## 2020-01-09 DIAGNOSIS — F0391 Unspecified dementia with behavioral disturbance: Secondary | ICD-10-CM | POA: Diagnosis not present

## 2020-01-09 DIAGNOSIS — E1122 Type 2 diabetes mellitus with diabetic chronic kidney disease: Secondary | ICD-10-CM | POA: Diagnosis not present

## 2020-01-09 DIAGNOSIS — L89154 Pressure ulcer of sacral region, stage 4: Secondary | ICD-10-CM | POA: Diagnosis not present

## 2020-01-09 DIAGNOSIS — I2699 Other pulmonary embolism without acute cor pulmonale: Secondary | ICD-10-CM | POA: Diagnosis not present

## 2020-01-09 DIAGNOSIS — I5032 Chronic diastolic (congestive) heart failure: Secondary | ICD-10-CM | POA: Diagnosis not present

## 2020-01-09 DIAGNOSIS — N183 Chronic kidney disease, stage 3 unspecified: Secondary | ICD-10-CM | POA: Diagnosis not present

## 2020-01-09 DIAGNOSIS — M6281 Muscle weakness (generalized): Secondary | ICD-10-CM | POA: Diagnosis not present

## 2020-01-09 DIAGNOSIS — L89626 Pressure-induced deep tissue damage of left heel: Secondary | ICD-10-CM | POA: Diagnosis not present

## 2020-01-09 DIAGNOSIS — I130 Hypertensive heart and chronic kidney disease with heart failure and stage 1 through stage 4 chronic kidney disease, or unspecified chronic kidney disease: Secondary | ICD-10-CM | POA: Diagnosis not present

## 2020-01-10 ENCOUNTER — Other Ambulatory Visit: Payer: Self-pay

## 2020-01-10 NOTE — Patient Outreach (Addendum)
[facility]) Care Management  [place]  01/10/2020   [name] 06-25-1922 [id number]  Subjective: Telephone call to daughter [name].  She reports that patient went to [place] clinic for PCP services now.  She states that patient will not be going back to Dr. Mariea [name] for primary care.  She is working with the [place] for in home care.  She was pleased with the doctor at the [place]. CM asked the physician name but she did not have the information handy.  She states that patient is overall doing ok and that wound in about the same.    Advised her that now that patient is established with the [facility] as primary care that [facility] CM would not be reaching out any longer.  She verbalized understanding and voices no concerns.    Objective:   Encounter Medications:  Outpatient Encounter Medications as of 01/10/2020  Medication Sig  . Accu-Chek Softclix Lancets lancets Use to check blood sugar three times daily. Dx: E11.51  . acetaminophen (TYLENOL) 500 MG tablet Take 500 mg by mouth every 6 (six) hours as needed for moderate pain.  [name] albuterol (VENTOLIN HFA) 108 (90 Base) MCG/ACT inhaler Inhale 2 puffs into the lungs every 4 (four) hours as needed for wheezing or shortness of breath. INHALE TWO PUFFS EVERY 4-6 HOURS AS NEEDED FOR DIFFICULTY BREATHING,WHEEZING OR COUGH (Patient taking differently: Inhale 2 puffs into the lungs every 4 (four) hours as needed for wheezing or shortness of breath. )  . Alcohol Swabs (SURE-PREP ALCOHOL PREP) 70 % PADS 1 each by Does not apply route as needed.  [name] amLODipine (NORVASC) 5 MG tablet Take 1 tablet (5 mg total) by mouth daily.  [name] apixaban (ELIQUIS) 5 MG TABS tablet Take 1 tablet (5 mg total) by mouth 2 (two) times daily.  [name] aspirin EC 81 MG tablet Take 1 tablet (81 mg total) by mouth daily.  . Blood Glucose Monitoring Suppl (ACCU-CHEK AVIVA) device Use to test blood sugar three times daily. Dx E11.51  . escitalopram (LEXAPRO) 5 MG tablet TAKE 1 TABLET EVERY DAY  (Patient taking differently: Take 5 mg by mouth daily. )  . feeding supplement, ENSURE ENLIVE, (ENSURE ENLIVE) LIQD Take 237 mLs by mouth 2 (two) times daily between meals.  [name] glucose blood (ACCU-CHEK AVIVA PLUS) test strip Check blood sugar three times daily as directed E11.51  . hydrochlorothiazide (HYDRODIURIL) 25 MG tablet TAKE 1 TABLET EVERY DAY (Patient taking differently: Take 25 mg by mouth daily. )  . insulin glargine (LANTUS) 100 UNIT/ML injection Inject 8 Units into the skin daily.   . Insulin Pen Needle (B-D UF III MINI PEN NEEDLES) 31G X 5 MM MISC Use to give insulin.  . Loratadine 10 MG CAPS Take 1 capsule (10 mg total) by mouth daily.  . Multiple Vitamins-Minerals (SENTRY SENIOR) TABS Take 1 tablet by mouth daily.  [name] NAMZARIC 28-10 MG CP24 TAKE 1 CAPSULE EVERY DAY (Patient taking differently: Take 1 capsule by mouth daily. )  . simvastatin (ZOCOR) 20 MG tablet Take 1 tablet (20 mg total) by mouth daily.  . tamsulosin (FLOMAX) 0.4 MG CAPS capsule Take 1 capsule (0.4 mg total) by mouth daily after supper.   No facility-administered encounter medications on file as of 01/10/2020.    Functional Status:  In your present state of health, do you have any difficulty performing the following activities: 12/10/2019 07/16/2019  Hearing? N N  Vision? N N  Difficulty concentrating or making decisions? [name]  Comment patient has dementia Patient needs assistance with all aspects of care.  Walking or climbing stairs? Y Y  Comment bed bound -  Dressing or bathing? [name]  Comment total care -  Doing errands, shopping? [name]  Preparing Food and eating ? [name]  Comment family assists with food. needs help with all aspects of care.  Using the Toilet? Y Y  Comment patient incontinent -  In the past six months, have you accidently leaked urine? (No Data) Y  Comment incontinent -  Do you have problems with loss of bowel control? Y Y  Comment incontinent -  Managing your Medications? [name]  Comment  daughter assists -  Managing your Finances? [name]  Comment daughter handles. -  Housekeeping or managing your Housekeeping? [name]  Comment daughter handles -  Some recent data might be hidden    Fall/Depression Screening: Fall Risk  12/10/2019 12/10/2019 11/13/2019  Falls in the past year? 0 0 0  Number falls in past yr: - - -  Injury with Fall? - - -  Comment - - -  Risk Factor Category  - - -  Risk for fall due to : - - -  Follow up - - -   PHQ 2/9 Scores 12/10/2019 11/09/2019 10/03/2019 07/16/2019 07/16/2019 01/22/2019 12/01/2018  PHQ - 2 Score - 0 0 - - 0 0  Exception Documentation Medical reason - - Other- indicate reason in comment box Other- indicate reason in comment box - -  Not completed - - - - unable to assess - -    Assessment: [facility]    Plan:  RN CM will close case and notify PCP of case closure.    Jone Baseman, RN, MSN [place] Management Care Management Coordinator Direct Line [phone] Cell [phone] Toll Free: [phone]  Fax: [phone]

## 2020-01-11 ENCOUNTER — Telehealth: Payer: Self-pay | Admitting: Internal Medicine

## 2020-01-11 DIAGNOSIS — E1122 Type 2 diabetes mellitus with diabetic chronic kidney disease: Secondary | ICD-10-CM | POA: Diagnosis not present

## 2020-01-11 DIAGNOSIS — N183 Chronic kidney disease, stage 3 unspecified: Secondary | ICD-10-CM | POA: Diagnosis not present

## 2020-01-11 DIAGNOSIS — M6281 Muscle weakness (generalized): Secondary | ICD-10-CM | POA: Diagnosis not present

## 2020-01-11 DIAGNOSIS — I2699 Other pulmonary embolism without acute cor pulmonale: Secondary | ICD-10-CM | POA: Diagnosis not present

## 2020-01-11 DIAGNOSIS — I130 Hypertensive heart and chronic kidney disease with heart failure and stage 1 through stage 4 chronic kidney disease, or unspecified chronic kidney disease: Secondary | ICD-10-CM | POA: Diagnosis not present

## 2020-01-11 DIAGNOSIS — L89154 Pressure ulcer of sacral region, stage 4: Secondary | ICD-10-CM | POA: Diagnosis not present

## 2020-01-11 DIAGNOSIS — L89626 Pressure-induced deep tissue damage of left heel: Secondary | ICD-10-CM | POA: Diagnosis not present

## 2020-01-11 DIAGNOSIS — I5032 Chronic diastolic (congestive) heart failure: Secondary | ICD-10-CM | POA: Diagnosis not present

## 2020-01-11 DIAGNOSIS — F0391 Unspecified dementia with behavioral disturbance: Secondary | ICD-10-CM | POA: Diagnosis not present

## 2020-01-11 NOTE — Telephone Encounter (Signed)
Patient is not returning to [facility]--going to [facility] so I have removed my name from his chart.

## 2020-01-15 DIAGNOSIS — I2699 Other pulmonary embolism without acute cor pulmonale: Secondary | ICD-10-CM | POA: Diagnosis not present

## 2020-01-15 DIAGNOSIS — M6281 Muscle weakness (generalized): Secondary | ICD-10-CM | POA: Diagnosis not present

## 2020-01-15 DIAGNOSIS — F0391 Unspecified dementia with behavioral disturbance: Secondary | ICD-10-CM | POA: Diagnosis not present

## 2020-01-15 DIAGNOSIS — L89626 Pressure-induced deep tissue damage of left heel: Secondary | ICD-10-CM | POA: Diagnosis not present

## 2020-01-15 DIAGNOSIS — E1122 Type 2 diabetes mellitus with diabetic chronic kidney disease: Secondary | ICD-10-CM | POA: Diagnosis not present

## 2020-01-15 DIAGNOSIS — N183 Chronic kidney disease, stage 3 unspecified: Secondary | ICD-10-CM | POA: Diagnosis not present

## 2020-01-15 DIAGNOSIS — L89154 Pressure ulcer of sacral region, stage 4: Secondary | ICD-10-CM | POA: Diagnosis not present

## 2020-01-15 DIAGNOSIS — I5032 Chronic diastolic (congestive) heart failure: Secondary | ICD-10-CM | POA: Diagnosis not present

## 2020-01-15 DIAGNOSIS — I130 Hypertensive heart and chronic kidney disease with heart failure and stage 1 through stage 4 chronic kidney disease, or unspecified chronic kidney disease: Secondary | ICD-10-CM | POA: Diagnosis not present

## 2020-01-17 ENCOUNTER — Ambulatory Visit: Payer: Medicare HMO | Admitting: Internal Medicine

## 2020-01-17 DIAGNOSIS — M6281 Muscle weakness (generalized): Secondary | ICD-10-CM | POA: Diagnosis not present

## 2020-01-17 DIAGNOSIS — L89154 Pressure ulcer of sacral region, stage 4: Secondary | ICD-10-CM | POA: Diagnosis not present

## 2020-01-17 DIAGNOSIS — I130 Hypertensive heart and chronic kidney disease with heart failure and stage 1 through stage 4 chronic kidney disease, or unspecified chronic kidney disease: Secondary | ICD-10-CM | POA: Diagnosis not present

## 2020-01-17 DIAGNOSIS — I5032 Chronic diastolic (congestive) heart failure: Secondary | ICD-10-CM | POA: Diagnosis not present

## 2020-01-17 DIAGNOSIS — I2699 Other pulmonary embolism without acute cor pulmonale: Secondary | ICD-10-CM | POA: Diagnosis not present

## 2020-01-17 DIAGNOSIS — L89626 Pressure-induced deep tissue damage of left heel: Secondary | ICD-10-CM | POA: Diagnosis not present

## 2020-01-17 DIAGNOSIS — N183 Chronic kidney disease, stage 3 unspecified: Secondary | ICD-10-CM | POA: Diagnosis not present

## 2020-01-17 DIAGNOSIS — E1122 Type 2 diabetes mellitus with diabetic chronic kidney disease: Secondary | ICD-10-CM | POA: Diagnosis not present

## 2020-01-17 DIAGNOSIS — F0391 Unspecified dementia with behavioral disturbance: Secondary | ICD-10-CM | POA: Diagnosis not present

## 2020-01-18 ENCOUNTER — Other Ambulatory Visit: Payer: Self-pay

## 2020-01-18 ENCOUNTER — Encounter (HOSPITAL_BASED_OUTPATIENT_CLINIC_OR_DEPARTMENT_OTHER): Payer: Medicare HMO | Attending: Internal Medicine | Admitting: Internal Medicine

## 2020-01-18 DIAGNOSIS — E11621 Type 2 diabetes mellitus with foot ulcer: Secondary | ICD-10-CM | POA: Diagnosis not present

## 2020-01-18 DIAGNOSIS — F0150 Vascular dementia without behavioral disturbance: Secondary | ICD-10-CM | POA: Diagnosis not present

## 2020-01-18 DIAGNOSIS — R6889 Other general symptoms and signs: Secondary | ICD-10-CM | POA: Diagnosis not present

## 2020-01-18 DIAGNOSIS — I10 Essential (primary) hypertension: Secondary | ICD-10-CM | POA: Diagnosis not present

## 2020-01-18 DIAGNOSIS — L89154 Pressure ulcer of sacral region, stage 4: Secondary | ICD-10-CM | POA: Insufficient documentation

## 2020-01-18 DIAGNOSIS — L97522 Non-pressure chronic ulcer of other part of left foot with fat layer exposed: Secondary | ICD-10-CM | POA: Diagnosis not present

## 2020-01-18 NOTE — Progress Notes (Addendum)
[name], [name] ([id number]) Visit Report for 01/18/2020 Arrival Information Details Patient Name: Date of Service: [name], [name] 01/18/2020 1:15 PM Medical Record Number: [id number] Patient Account Number: [id number] Date of Birth/Sex: Treating RN: [birth year] (86 y.o. [name] Primary Care Shaleah Nissley: Hollace Kinnier Other Clinician: Referring Mossie Gilder: Treating Denya Buckingham/Extender: Frederick Peers in Treatment: 49 Visit Information History Since Last Visit Added or deleted any medications: Yes Patient Arrived: Wheel Chair Any new allergies or adverse reactions: No Arrival Time: 13:18 Had a fall or experienced change in No Accompanied By: daughter activities of daily living that may affect Transfer Assistance: Harrel Lemon Lift risk of falls: Patient Identification Verified: Yes Signs or symptoms of abuse/neglect since last visito No Secondary Verification Process Completed: Yes Hospitalized since last visit: No Patient Requires Transmission-Based Precautions: No Has Dressing in Place as Prescribed: Yes Patient Has Alerts: No Pain Present Now: No Electronic Signature(s) Signed: 01/18/2020 4:42:48 PM By: Baruch Gouty RN, BSN Entered By: Baruch Gouty on 01/18/2020 13:33:39 -------------------------------------------------------------------------------- Clinic Level of Care Assessment Details Patient Name: Date of Service: [name] OLUWATOMISIN, [name] 01/18/2020 1:15 PM Medical Record Number: [id number] Patient Account Number: [id number] Date of Birth/Sex: Treating RN: [birth year] (86 y.o. Marvis Repress Primary Care Mat Stuard: Hollace Kinnier Other Clinician: Referring Melany Wiesman: Treating Jaydalee Bardwell/Extender: Frederick Peers in Treatment: 13 Clinic Level of Care Assessment Items TOOL 4 Quantity Score X- 1 0 Use when only an EandM is performed on FOLLOW-UP visit ASSESSMENTS - Nursing Assessment / Reassessment X- 1 10 Reassessment of  Co-morbidities (includes updates in patient status) X- 1 5 Reassessment of Adherence to Treatment Plan ASSESSMENTS - Wound and Skin A ssessment / Reassessment X - Simple Wound Assessment / Reassessment - one wound 1 5 []  - 0 Complex Wound Assessment / Reassessment - multiple wounds []  - 0 Dermatologic / Skin Assessment (not related to wound area) ASSESSMENTS - Focused Assessment []  - 0 Circumferential Edema Measurements - multi extremities []  - 0 Nutritional Assessment / Counseling / Intervention []  - 0 Lower Extremity Assessment (monofilament, tuning fork, pulses) []  - 0 Peripheral Arterial Disease Assessment (using hand held doppler) ASSESSMENTS - Ostomy and/or Continence Assessment and Care []  - 0 Incontinence Assessment and Management []  - 0 Ostomy Care Assessment and Management (repouching, etc.) PROCESS - Coordination of Care X - Simple Patient / Family Education for ongoing care 1 15 []  - 0 Complex (extensive) Patient / Family Education for ongoing care X- 1 10 Staff obtains Consents, Records, T Results / Process Orders est X- 1 10 Staff telephones HHA, Nursing Homes / Clarify orders / etc []  - 0 Routine Transfer to another Facility (non-emergent condition) []  - 0 Routine [name] Admission (non-emergent condition) []  - 0 New Admissions / Biomedical engineer / Ordering NPWT Apligraf, etc. , []  - 0 Emergency [name] Admission (emergent condition) X- 1 10 Simple Discharge Coordination []  - 0 Complex (extensive) Discharge Coordination PROCESS - Special Needs []  - 0 Pediatric / Minor Patient Management []  - 0 Isolation Patient Management []  - 0 Hearing / Language / Visual special needs []  - 0 Assessment of Community assistance (transportation, D/C planning, etc.) []  - 0 Additional assistance / Altered mentation []  - 0 Support Surface(s) Assessment (bed, cushion, seat, etc.) INTERVENTIONS - Wound Cleansing / Measurement X - Simple Wound Cleansing -  one wound 1 5 []  - 0 Complex Wound Cleansing - multiple wounds X- 1 5 Wound Imaging (photographs - any number of wounds) []  - 0 Wound Tracing (instead of  photographs) X- 1 5 Simple Wound Measurement - one wound []  - 0 Complex Wound Measurement - multiple wounds INTERVENTIONS - Wound Dressings X - Small Wound Dressing one or multiple wounds 1 10 []  - 0 Medium Wound Dressing one or multiple wounds []  - 0 Large Wound Dressing one or multiple wounds X- 1 5 Application of Medications - topical []  - 0 Application of Medications - injection INTERVENTIONS - Miscellaneous []  - 0 External ear exam []  - 0 Specimen Collection (cultures, biopsies, blood, body fluids, etc.) []  - 0 Specimen(s) / Culture(s) sent or taken to Lab for analysis X- 1 10 Patient Transfer (multiple staff / Harrel Lemon Lift / Similar devices) []  - 0 Simple Staple / Suture removal (25 or less) []  - 0 Complex Staple / Suture removal (26 or more) []  - 0 Hypo / Hyperglycemic Management (close monitor of Blood Glucose) []  - 0 Ankle / Brachial Index (ABI) - do not check if billed separately X- 1 5 Vital Signs Has the patient been seen at the [name] within the last three years: Yes Total Score: 110 Level Of Care: New/Established - Level 3 Electronic Signature(s) Signed: 01/18/2020 5:07:15 PM By: Kela Millin Entered By: Kela Millin on 01/18/2020 14:38:48 -------------------------------------------------------------------------------- Multi Wound Chart Details Patient Name: Date of Service: [facility] [name], [name] 01/18/2020 1:15 PM Medical Record Number: [id number] Patient Account Number: [id number] Date of Birth/Sex: Treating RN: July 21, 1922 (86 y.o. Marvis Repress Primary Care Manasseh Pittsley: Hollace Kinnier Other Clinician: Referring Shona Pardo: Treating Paislee Szatkowski/Extender: Frederick Peers in Treatment: 13 Vital Signs Height(in): 71 Pulse(bpm): 77 Weight(lbs): 175 Blood Pressure(mmHg):  125/52 Body Mass Index(BMI): 24 Temperature(F): 98.7 Respiratory Rate(breaths/min): 18 Photos: [1:No Photos Sacrum] [N/A:N/A N/A] Wound Location: [1:Pressure Injury] [N/A:N/A] Wounding Event: [1:Pressure Ulcer] [N/A:N/A] Primary Etiology: [1:Glaucoma, Chronic Obstructive] [N/A:N/A] Comorbid History: [1:Pulmonary Disease (COPD), Coronary Artery Disease, Hypertension, Type II Diabetes, Osteoarthritis, Dementia 09/03/2019] [N/A:N/A] Date Acquired: [1:13] [N/A:N/A] Weeks of Treatment: [1:Open] [N/A:N/A] Wound Status: [1:4.4x1.7x2.7] [N/A:N/A] Measurements L x W x D (cm) [1:5.875] [N/A:N/A] A (cm) : rea [1:15.862] [N/A:N/A] Volume (cm) : [1:32.60%] [N/A:N/A] % Reduction in A rea: [1:-7.00%] [N/A:N/A] % Reduction in Volume: [1:12] Starting Position 1 (o'clock): [1:6] Ending Position 1 (o'clock): [1:4] Maximum Distance 1 (cm): [1:Yes] [N/A:N/A] Undermining: [1:Category/Stage IV] [N/A:N/A] Classification: [1:Medium] [N/A:N/A] Exudate A mount: [1:Serosanguineous] [N/A:N/A] Exudate Type: [1:red, brown] [N/A:N/A] Exudate Color: [1:Well defined, not attached] [N/A:N/A] Wound Margin: [1:Large (67-100%)] [N/A:N/A] Granulation A mount: [1:Red] [N/A:N/A] Granulation Quality: [1:Small (1-33%)] [N/A:N/A] Necrotic A mount: [1:Fat Layer (Subcutaneous Tissue)] [N/A:N/A] Exposed Structures: [1:Exposed: Yes Muscle: Yes Bone: Yes Fascia: No Tendon: No Joint: No Small (1-33%)] [N/A:N/A] Treatment Notes Electronic Signature(s) Signed: 01/18/2020 5:07:15 PM By: Kela Millin Signed: 01/18/2020 5:24:49 PM By: Linton Ham MD Entered By: Linton Ham on 01/18/2020 14:18:19 -------------------------------------------------------------------------------- Multi-Disciplinary Care Plan Details Patient Name: Date of Service: [name] HARROL, NOVELLO 01/18/2020 1:15 PM Medical Record Number: [id number] Patient Account Number: [id number] Date of Birth/Sex: Treating RN: 1921/09/24 (86 y.o. Marvis Repress Primary Care Karmyn Lowman: Hollace Kinnier Other Clinician: Referring Lucella Pommier: Treating Tametha Banning/Extender: Frederick Peers in Treatment: 13 Active Inactive Abuse / Safety / Falls / Self Care Management Nursing Diagnoses: Potential for falls Goals: Patient/caregiver will verbalize/demonstrate measures taken to prevent injury and/or falls Date Initiated: 10/17/2019 Target Resolution Date: 01/25/2020 Goal Status: Active Interventions: Assess fall risk on admission and as needed Assess: immobility, friction, shearing, incontinence upon admission and as needed Assess impairment of mobility on admission and as needed per policy Notes: Nutrition  Nursing Diagnoses: Imbalanced nutrition Potential for alteratiion in Nutrition/Potential for imbalanced nutrition Goals: Patient/caregiver agrees to and verbalizes understanding of need to use nutritional supplements and/or vitamins as prescribed Date Initiated: 10/17/2019 Target Resolution Date: 01/25/2020 Goal Status: Active Interventions: Assess patient nutrition upon admission and as needed per policy Treatment Activities: Patient referred to Primary Care Physician for further nutritional evaluation : 10/17/2019 Notes: Pressure Nursing Diagnoses: Knowledge deficit related to causes and risk factors for pressure ulcer development Knowledge deficit related to management of pressures ulcers Goals: Patient/caregiver will verbalize understanding of pressure ulcer management Date Initiated: 10/17/2019 Target Resolution Date: 01/25/2020 Goal Status: Active Interventions: Assess: immobility, friction, shearing, incontinence upon admission and as needed Assess offloading mechanisms upon admission and as needed Assess potential for pressure ulcer upon admission and as needed Provide education on pressure ulcers Notes: Wound/Skin Impairment Nursing Diagnoses: Impaired tissue integrity Knowledge deficit related to  ulceration/compromised skin integrity Goals: Patient/caregiver will verbalize understanding of skin care regimen Date Initiated: 10/17/2019 Target Resolution Date: 01/25/2020 Goal Status: Active Ulcer/skin breakdown will have a volume reduction of 30% by week 4 Date Initiated: 10/17/2019 Date Inactivated: 12/28/2019 Target Resolution Date: 12/28/2019 Goal Status: Unmet Unmet Reason: chronic wound Interventions: Assess patient/caregiver ability to obtain necessary supplies Assess patient/caregiver ability to perform ulcer/skin care regimen upon admission and as needed Assess ulceration(s) every visit Provide education on ulcer and skin care Treatment Activities: Skin care regimen initiated : 10/17/2019 Topical wound management initiated : 10/17/2019 Notes: Electronic Signature(s) Signed: 01/18/2020 5:07:15 PM By: Kela Millin Entered By: Kela Millin on 01/18/2020 13:03:08 -------------------------------------------------------------------------------- Pain Assessment Details Patient Name: Date of Service: GURKARAN, RAHM 01/18/2020 1:15 PM Medical Record Number: [id number] Patient Account Number: [id number] Date of Birth/Sex: Treating RN: [birth year] (86 y.o. [name] Primary Care Brinly Maietta: Hollace Kinnier Other Clinician: Referring Cailan General: Treating Keyontae Huckeby/Extender: Frederick Peers in Treatment: 13 Active Problems Location of Pain Severity and Description of Pain Patient Has Paino No Site Locations Rate the pain. Current Pain Level: 0 Pain Management and Medication Current Pain Management: Electronic Signature(s) Signed: 01/18/2020 4:42:48 PM By: Baruch Gouty RN, BSN Entered By: Baruch Gouty on 01/18/2020 13:34:22 -------------------------------------------------------------------------------- Patient/Caregiver Education Details Patient Name: Date of Service: [name] 6/18/2021andnbsp1:15 PM Medical Record Number:  [id number] Patient Account Number: [id number] Date of Birth/Gender: Treating RN: 03-20-1922 (86 y.o. Marvis Repress Primary Care Physician: Hollace Kinnier Other Clinician: Referring Physician: Treating Physician/Extender: Frederick Peers in Treatment: 13 Education Assessment Education Provided To: Patient Education Topics Provided Pressure: Handouts: Pressure Ulcers: Care and Offloading Methods: Explain/Verbal Responses: State content correctly Wound/Skin Impairment: Handouts: Caring for Your Ulcer Methods: Explain/Verbal Responses: State content correctly Electronic Signature(s) Signed: 01/18/2020 5:07:15 PM By: Kela Millin Entered By: Kela Millin on 01/18/2020 13:03:56 -------------------------------------------------------------------------------- Wound Assessment Details Patient Name: Date of Service: [name], [name] 01/18/2020 1:15 PM Medical Record Number: [id number] Patient Account Number: [id number] Date of Birth/Sex: Treating RN: [birth year] (86 y.o. [name] Primary Care Hydeia Mcatee: Hollace Kinnier Other Clinician: Referring Arseniy Toomey: Treating Caprice Wasko/Extender: Frederick Peers in Treatment: 13 Wound Status Wound Number: 1 Primary Pressure Ulcer Etiology: Wound Location: Sacrum Wound Open Wounding Event: Pressure Injury Status: Date Acquired: 09/03/2019 Comorbid Glaucoma, Chronic Obstructive Pulmonary Disease (COPD), Weeks Of Treatment: 13 History: Coronary Artery Disease, Hypertension, Type II Diabetes, Clustered Wound: No Osteoarthritis, Dementia Photos Wound Measurements Length: (cm) 4.4 Width: (cm) 1.7 Depth: (cm) 2.7 Area: (cm) 5.875 Volume: (cm) 15.862 % Reduction in Area: 32.6% % Reduction  in Volume: -7% Epithelialization: Small (1-33%) Tunneling: No Undermining: Yes Starting Position (o'clock): 12 Ending Position (o'clock): 6 Maximum Distance: (cm) 4 Wound  Description Classification: Category/Stage IV Wound Margin: Well defined, not attached Exudate Amount: Medium Exudate Type: Serosanguineous Exudate Color: red, brown Foul Odor After Cleansing: No Slough/Fibrino Yes Wound Bed Granulation Amount: Large (67-100%) Exposed Structure Granulation Quality: Red Fascia Exposed: No Necrotic Amount: Small (1-33%) Fat Layer (Subcutaneous Tissue) Exposed: Yes Necrotic Quality: Adherent Slough Tendon Exposed: No Muscle Exposed: Yes Necrosis of Muscle: No Joint Exposed: No Bone Exposed: Yes Electronic Signature(s) Signed: 01/23/2020 4:25:30 PM By: Baruch Gouty RN, BSN Signed: 01/23/2020 5:20:20 PM By: Minerva Fester Previous Signature: 01/18/2020 4:42:48 PM Version By: Baruch Gouty RN, BSN Entered By: Minerva Fester on 01/23/2020 10:00:32 -------------------------------------------------------------------------------- [place] Details Patient Name: Date of Service: [name] ZHION, PEVEHOUSE 01/18/2020 1:15 PM Medical Record Number: [id number] Patient Account Number: [id number] Date of Birth/Sex: Treating RN: 08-18-1921 (86 y.o. [name] Primary Care Eean Buss: Hollace Kinnier Other Clinician: Referring Crescencio Jozwiak: Treating Daijanae Rafalski/Extender: Frederick Peers in Treatment: 13 Vital Signs Time Taken: 13:33 Temperature (F): 98.7 Height (in): 71 Pulse (bpm): 77 Source: Stated Respiratory Rate (breaths/min): 18 Weight (lbs): 175 Blood Pressure (mmHg): 125/52 Source: Stated Reference Range: 80 - 120 mg / dl Body Mass Index (BMI): 24.4 Electronic Signature(s) Signed: 01/18/2020 4:42:48 PM By: Baruch Gouty RN, BSN Entered By: Baruch Gouty on 01/18/2020 13:34:11

## 2020-01-18 NOTE — Progress Notes (Signed)
[name], [name] ([id number]) Visit Report for 01/18/2020 HPI Details Patient Name: Date of Service: [name], [name] 01/18/2020 1:15 PM Medical Record Number: [id number] Patient Account Number: [id number] Date of Birth/Sex: Treating RN: February 02, 1922 (86 y.o. [name] Primary Care Provider: Hollace Kinnier Other Clinician: Referring Provider: Treating Provider/Extender: Frederick Peers in Treatment: 13 History of Present Illness HPI Description: 10/17/2019 upon evaluation today patient presents for initial inspection here in our clinic concerning issues that has been having with a wound in the sacral region as well as the left heel. Fortunately neither appears to be overtly and significantly infected there is some necrotic tissue noted at both locations however that is going to be required to be addressed. He does have a history of diabetes mellitus type 2, vascular dementia, and hypertension. The patient did have a stroke in November 2020 unfortunately he has not been the same since that time according to his daughter. Prior to that he was taking care of himself now he is 100% dependent. He is at home with his daughter who is present during the office visit today as well that is where I get the majority of the history from at this point. Patient does have a history of hypertension as well which does not appear to be too out of control today which is good news. He does have a hemoglobin A1c of 6.05 May 2019. They currently have been using a calcium alginate dressing to the sacrum and it sounds like Skin-Prep and a foam on the heel. 4/9; pressure ulcers on the sacral area and left heel. Been using Anasept wet-to-dry on the sacrum and Hydrofera Blue in the heel. He has home health family is helping with the dressing. It sounds as though they are fairly religious about offloading these areas that have pressure relief surfaces for the bed. Wounds look better 4/30; I have  not seen this patient in 3 weeks. We have been using Anasept wet-to-dry the sacrum and Hydrofera Blue on the heel. When I saw this 3 weeks ago the sacrum actually look quite good and I thought that we might be able to transition him to a collagen-based dressing today. 5/10; the since the patient was last here he was hospitalized from 5/5 through 12/07/2019. He was noted to have a pressure injury of the sacral region stage IV. MRI did not suggest discitis or osteomyelitis. There was nonspecific bilateral paraspinal muscle edema. Culture I did when he was here the last time showed E. coli I believe they are aware of this. Blood cultures were negative. His white count was 10.8 on admission 11.9 at discharge his comprehensive metabolic panel notable for an albumin of 2.4 BUN of 32 with creatinine of 0.03 hemoglobin was only 7.3 with a discharge hemoglobin of 8.1 after dropping to a hemoglobin of 6.6 He is back at home. Discharge antibiotic was Augmentin.[name] He was treated with IV Rocephin in the hospital. The patient was seen by palliative care I have not looked at this consult. They have been using dressings that they had left over at home I think most recently [name]. According to his daughter the patient is eating fairly well 12/28/19-Patient is back at 2 weeks, the sacral wound is about the same, the right heel has healed and closed. We are using silver alginate to the sacral wound 6/18; we continue to follow the sacral wound. His heel wounds have closed. We have been using silver alginate to the sacrum Electronic Signature(s) Signed: 01/18/2020  5:24:49 PM By: Linton Ham MD Entered By: Linton Ham on 01/18/2020 14:19:31 -------------------------------------------------------------------------------- Physical Exam Details Patient Name: Date of Service: [name], [name] 01/18/2020 1:15 PM Medical Record Number: [id number] Patient Account Number: [id number] Date of Birth/Sex: Treating  RN: May 03, 1922 (86 y.o. [name] Primary Care Provider: Hollace Kinnier Other Clinician: Referring Provider: Treating Provider/Extender: Frederick Peers in Treatment: 13 Constitutional Sitting or standing Blood Pressure is within target range for patient.. Pulse regular and within target range for patient.[name] Respirations regular, non-labored and within target range.. Temperature is normal and within the target range for the patient.[name] Appears in no distress. Gastrointestinal (GI) Abdomen is soft and non-distended without masses or tenderness.. No liver or spleen enlargement. Notes Wound exam; sacral wound with undermining. The tissue looks clean. Still some exposed bone but a minor amount. There is no soft tissue erythema nothing around the wound that looks infected. His heel wounds are closed Electronic Signature(s) Signed: 01/18/2020 5:24:49 PM By: Linton Ham MD Entered By: Linton Ham on 01/18/2020 14:20:28 -------------------------------------------------------------------------------- Physician Orders Details Patient Name: Date of Service: [address] [name], [name] 01/18/2020 1:15 PM Medical Record Number: [id number] Patient Account Number: [id number] Date of Birth/Sex: Treating RN: 12/30/1921 (86 y.o. [name] Primary Care Provider: Hollace Kinnier Other Clinician: Referring Provider: Treating Provider/Extender: Frederick Peers in Treatment: 13 Verbal / Phone Orders: No Diagnosis Coding ICD-10 Coding Code Description L89.154 Pressure ulcer of sacral region, stage 4 E11.621 Type 2 diabetes mellitus with foot ulcer L97.522 Non-pressure chronic ulcer of other part of left foot with fat layer exposed F01.50 Vascular dementia without behavioral disturbance I10 Essential (primary) hypertension Follow-up Appointments Return appointment in 3 weeks. - ****[facility] - ROOM 5**** Dressing Change Frequency Wound #1 Sacrum Change  Dressing every other day. Skin Barriers/Peri-Wound Care Wound #1 Sacrum Skin Prep - to periwound Wound Cleansing Wound #1 Sacrum Clean wound with Wound Cleanser Primary Wound Dressing Wound #1 Sacrum Silver Collagen - to base of wound and moistened saline gauze as backing Other: - moistened saline gauze Secondary Dressing Wound #1 Sacrum ABD pad - secure with tegaderm to keep dressing from getting soiled Off-Loading Turn and reposition every 2 hours Other: - float heels with pillows under calves while in bed [place] skilled nursing for wound care. - [facility] Electronic Signature(s) Signed: 01/18/2020 5:07:15 PM By: Kela Millin Signed: 01/18/2020 5:24:49 PM By: Linton Ham MD Entered By: Kela Millin on 01/18/2020 14:16:38 -------------------------------------------------------------------------------- Problem List Details Patient Name: Date of Service: [facility] [name], [name] 01/18/2020 1:15 PM Medical Record Number: [id number] Patient Account Number: [id number] Date of Birth/Sex: Treating RN: 01/29/1922 (86 y.o. [name] Primary Care Provider: Hollace Kinnier Other Clinician: Referring Provider: Treating Provider/Extender: Frederick Peers in Treatment: 13 Active Problems ICD-10 Encounter Code Description Active Date MDM Diagnosis L89.154 Pressure ulcer of sacral region, stage 4 10/17/2019 No Yes E11.621 Type 2 diabetes mellitus with foot ulcer 10/17/2019 No Yes L97.522 Non-pressure chronic ulcer of other part of left foot with fat layer exposed 10/17/2019 No Yes F01.50 Vascular dementia without behavioral disturbance 10/17/2019 No Yes I10 Essential (primary) hypertension 10/17/2019 No Yes Inactive Problems Resolved Problems Electronic Signature(s) Signed: 01/18/2020 5:24:49 PM By: Linton Ham MD Entered By: Linton Ham on 01/18/2020  14:18:00 -------------------------------------------------------------------------------- Progress Note Details Patient Name: Date of Service: [facility] [name], [name] 01/18/2020 1:15 PM Medical Record Number: [id number] Patient Account Number: [id number] Date of Birth/Sex: Treating RN: 1921-09-10 (86 y.o. [name],  [name] Primary Care Provider: Hollace Kinnier Other Clinician: Referring Provider: Treating Provider/Extender: Frederick Peers in Treatment: 13 Subjective History of Present Illness (HPI) 10/17/2019 upon evaluation today patient presents for initial inspection here in our clinic concerning issues that has been having with a wound in the sacral region as well as the left heel. Fortunately neither appears to be overtly and significantly infected there is some necrotic tissue noted at both locations however that is going to be required to be addressed. He does have a history of diabetes mellitus type 2, vascular dementia, and hypertension. The patient did have a stroke in November 2020 unfortunately he has not been the same since that time according to his daughter. Prior to that he was taking care of himself now he is 100% dependent. He is at home with his daughter who is present during the office visit today as well that is where I get the majority of the history from at this point. Patient does have a history of hypertension as well which does not appear to be too out of control today which is good news. He does have a hemoglobin A1c of 6.05 May 2019. They currently have been using a calcium alginate dressing to the sacrum and it sounds like Skin-Prep and a foam on the heel. 4/9; pressure ulcers on the sacral area and left heel. Been using Anasept wet-to-dry on the sacrum and Hydrofera Blue in the heel. He has home health family is helping with the dressing. It sounds as though they are fairly religious about offloading these areas that have pressure relief surfaces  for the bed. Wounds look better 4/30; I have not seen this patient in 3 weeks. We have been using Anasept wet-to-dry the sacrum and Hydrofera Blue on the heel. When I saw this 3 weeks ago the sacrum actually look quite good and I thought that we might be able to transition him to a collagen-based dressing today. 5/10; the since the patient was last here he was hospitalized from 5/5 through 12/07/2019. He was noted to have a pressure injury of the sacral region stage IV. MRI did not suggest discitis or osteomyelitis. There was nonspecific bilateral paraspinal muscle edema. Culture I did when he was here the last time showed E. coli I believe they are aware of this. Blood cultures were negative. His white count was 10.8 on admission 11.9 at discharge his comprehensive metabolic panel notable for an albumin of 2.4 BUN of 32 with creatinine of 0.03 hemoglobin was only 7.3 with a discharge hemoglobin of 8.1 after dropping to a hemoglobin of 6.6 He is back at home. Discharge antibiotic was Augmentin.[name] He was treated with IV Rocephin in the hospital. The patient was seen by palliative care I have not looked at this consult. They have been using dressings that they had left over at home I think most recently [facility]. According to his daughter the patient is eating fairly well 12/28/19-Patient is back at 2 weeks, the sacral wound is about the same, the right heel has healed and closed. We are using silver alginate to the sacral wound 6/18; we continue to follow the sacral wound. His heel wounds have closed. We have been using silver alginate to the sacrum Objective Constitutional Sitting or standing Blood Pressure is within target range for patient.. Pulse regular and within target range for patient.[name] Respirations regular, non-labored and within target range.. Temperature is normal and within the target range for the patient.[name] Appears in no distress.  Vitals Time Taken: 1:33 PM, Height: 71 in, Source:  Stated, Weight: 175 lbs, Source: Stated, BMI: 24.4, Temperature: 98.7 F, Pulse: 77 bpm, Respiratory Rate: 18 breaths/min, Blood Pressure: 125/52 mmHg. Gastrointestinal (GI) Abdomen is soft and non-distended without masses or tenderness.. No liver or spleen enlargement. General Notes: Wound exam; sacral wound with undermining. The tissue looks clean. Still some exposed bone but a minor amount. There is no soft tissue erythema nothing around the wound that looks infected. His heel wounds are closed Integumentary (Hair, Skin) Wound #1 status is Open. Original cause of wound was Pressure Injury. The wound is located on the Sacrum. The wound measures 4.4cm length x 1.7cm width x 2.7cm depth; 5.875cm^2 area and 15.862cm^3 volume. There is bone, muscle, and Fat Layer (Subcutaneous Tissue) Exposed exposed. There is no tunneling noted, however, there is undermining starting at 12:00 and ending at 6:00 with a maximum distance of 4cm. There is a medium amount of serosanguineous drainage noted. The wound margin is well defined and not attached to the wound base. There is large (67-100%) red granulation within the wound bed. There is a small (1-33%) amount of necrotic tissue within the wound bed including Adherent Slough. Assessment Active Problems ICD-10 Pressure ulcer of sacral region, stage 4 Type 2 diabetes mellitus with foot ulcer Non-pressure chronic ulcer of other part of left foot with fat layer exposed Vascular dementia without behavioral disturbance Essential (primary) hypertension Plan Follow-up Appointments: Return appointment in 3 weeks. - ****[facility] - ROOM 5**** Dressing Change Frequency: Wound #1 Sacrum: Change Dressing every other day. Skin Barriers/Peri-Wound Care: Wound #1 Sacrum: Skin Prep - to periwound Wound Cleansing: Wound #1 Sacrum: Clean wound with Wound Cleanser Primary Wound Dressing: Wound #1 Sacrum: Silver Collagen - to base of wound and moistened saline gauze as  backing Other: - moistened saline gauze Secondary Dressing: Wound #1 Sacrum: ABD pad - secure with tegaderm to keep dressing from getting soiled Off-Loading: Turn and reposition every 2 hours Other: - float heels with pillows under calves while in bed Home Health: [place] skilled nursing for wound care. - [facility] 1. I change the primary dressing to the sacrum from silver alginate to moistened silver collagen with the backing wet-to-dry gauze. Goal of this will be to try and stimulate further granulation 2. There is no evidence of infection in the wound 3. We did talk about offloading is daughter is aware of this. It would seem that nutritional issues are a problem although she is using Ensure. Electronic Signature(s) Signed: 01/18/2020 5:24:49 PM By: Linton Ham MD Entered By: Linton Ham on 01/18/2020 14:21:29 -------------------------------------------------------------------------------- SuperBill Details Patient Name: Date of Service: [facility] [name], [name] 01/18/2020 Medical Record Number: [id number] Patient Account Number: [id number] Date of Birth/Sex: Treating RN: May 05, 1922 (86 y.o. [name] Primary Care Provider: Hollace Kinnier Other Clinician: Referring Provider: Treating Provider/Extender: Frederick Peers in Treatment: 13 Diagnosis Coding ICD-10 Codes Code Description [phone] Pressure ulcer of sacral region, stage 4 E11.621 Type 2 diabetes mellitus with foot ulcer L97.522 Non-pressure chronic ulcer of other part of left foot with fat layer exposed F01.50 Vascular dementia without behavioral disturbance I10 Essential (primary) hypertension Facility Procedures CPT4 Code: [id number] Description: 99213 - WOUND CARE VISIT-LEV 3 EST PT Modifier: Quantity: 1 Physician Procedures : CPT4 Code Description Modifier [id number] - WC PHYS LEVEL 3 - EST PT ICD-10 Diagnosis Description L89.154 Pressure ulcer of sacral region, stage  4 Quantity: 1 Electronic Signature(s) Signed: 01/18/2020 5:07:15 PM By: Kela Millin  Signed: 01/18/2020 5:24:49 PM By: Linton Ham MD Entered By: Kela Millin on 01/18/2020 14:39:01

## 2020-01-21 DIAGNOSIS — L89626 Pressure-induced deep tissue damage of left heel: Secondary | ICD-10-CM | POA: Diagnosis not present

## 2020-01-21 DIAGNOSIS — I5032 Chronic diastolic (congestive) heart failure: Secondary | ICD-10-CM | POA: Diagnosis not present

## 2020-01-21 DIAGNOSIS — F0391 Unspecified dementia with behavioral disturbance: Secondary | ICD-10-CM | POA: Diagnosis not present

## 2020-01-21 DIAGNOSIS — N183 Chronic kidney disease, stage 3 unspecified: Secondary | ICD-10-CM | POA: Diagnosis not present

## 2020-01-21 DIAGNOSIS — M6281 Muscle weakness (generalized): Secondary | ICD-10-CM | POA: Diagnosis not present

## 2020-01-21 DIAGNOSIS — L89154 Pressure ulcer of sacral region, stage 4: Secondary | ICD-10-CM | POA: Diagnosis not present

## 2020-01-21 DIAGNOSIS — I2699 Other pulmonary embolism without acute cor pulmonale: Secondary | ICD-10-CM | POA: Diagnosis not present

## 2020-01-21 DIAGNOSIS — I130 Hypertensive heart and chronic kidney disease with heart failure and stage 1 through stage 4 chronic kidney disease, or unspecified chronic kidney disease: Secondary | ICD-10-CM | POA: Diagnosis not present

## 2020-01-21 DIAGNOSIS — E1122 Type 2 diabetes mellitus with diabetic chronic kidney disease: Secondary | ICD-10-CM | POA: Diagnosis not present

## 2020-01-29 DIAGNOSIS — F0391 Unspecified dementia with behavioral disturbance: Secondary | ICD-10-CM | POA: Diagnosis not present

## 2020-01-29 DIAGNOSIS — E1122 Type 2 diabetes mellitus with diabetic chronic kidney disease: Secondary | ICD-10-CM | POA: Diagnosis not present

## 2020-01-29 DIAGNOSIS — I130 Hypertensive heart and chronic kidney disease with heart failure and stage 1 through stage 4 chronic kidney disease, or unspecified chronic kidney disease: Secondary | ICD-10-CM | POA: Diagnosis not present

## 2020-01-29 DIAGNOSIS — N183 Chronic kidney disease, stage 3 unspecified: Secondary | ICD-10-CM | POA: Diagnosis not present

## 2020-01-29 DIAGNOSIS — L89626 Pressure-induced deep tissue damage of left heel: Secondary | ICD-10-CM | POA: Diagnosis not present

## 2020-01-29 DIAGNOSIS — I2699 Other pulmonary embolism without acute cor pulmonale: Secondary | ICD-10-CM | POA: Diagnosis not present

## 2020-01-29 DIAGNOSIS — M6281 Muscle weakness (generalized): Secondary | ICD-10-CM | POA: Diagnosis not present

## 2020-01-29 DIAGNOSIS — L89154 Pressure ulcer of sacral region, stage 4: Secondary | ICD-10-CM | POA: Diagnosis not present

## 2020-01-29 DIAGNOSIS — I5032 Chronic diastolic (congestive) heart failure: Secondary | ICD-10-CM | POA: Diagnosis not present

## 2020-01-31 ENCOUNTER — Encounter (HOSPITAL_BASED_OUTPATIENT_CLINIC_OR_DEPARTMENT_OTHER): Payer: Medicare HMO | Attending: Internal Medicine | Admitting: Internal Medicine

## 2020-01-31 DIAGNOSIS — L89154 Pressure ulcer of sacral region, stage 4: Secondary | ICD-10-CM | POA: Insufficient documentation

## 2020-01-31 DIAGNOSIS — Z8673 Personal history of transient ischemic attack (TIA), and cerebral infarction without residual deficits: Secondary | ICD-10-CM | POA: Insufficient documentation

## 2020-01-31 DIAGNOSIS — E11622 Type 2 diabetes mellitus with other skin ulcer: Secondary | ICD-10-CM | POA: Diagnosis not present

## 2020-01-31 DIAGNOSIS — E11621 Type 2 diabetes mellitus with foot ulcer: Secondary | ICD-10-CM | POA: Diagnosis not present

## 2020-01-31 DIAGNOSIS — F0150 Vascular dementia without behavioral disturbance: Secondary | ICD-10-CM | POA: Insufficient documentation

## 2020-01-31 DIAGNOSIS — L97522 Non-pressure chronic ulcer of other part of left foot with fat layer exposed: Secondary | ICD-10-CM | POA: Insufficient documentation

## 2020-01-31 DIAGNOSIS — I10 Essential (primary) hypertension: Secondary | ICD-10-CM | POA: Diagnosis not present

## 2020-01-31 DIAGNOSIS — R6889 Other general symptoms and signs: Secondary | ICD-10-CM | POA: Diagnosis not present

## 2020-02-01 NOTE — Progress Notes (Signed)
[name], [name] ([id number]) Visit Report for 01/31/2020 Arrival Information Details Patient Name: Date of Service: [name], [name] 01/31/2020 10:45 A M Medical Record Number: [id number] Patient Account Number: [id number] Date of Birth/Sex: Treating RN: 1922-07-23 (86 y.o. [name]) Carlene Coria Primary Care [place] Shannahan: Hollace Kinnier Other Clinician: Referring Kandiss Ihrig: Treating Sahan Pen/Extender: Frederick Peers in Treatment: 15 Visit Information History Since Last Visit All ordered tests and consults were completed: No Patient Arrived: Wheel Chair Added or deleted any medications: No Arrival Time: 10:44 Any new allergies or adverse reactions: No Accompanied By: daughter Had a fall or experienced change in No Transfer Assistance: Civil Service fast streamer activities of daily living that may affect Patient Identification Verified: Yes risk of falls: Secondary Verification Process Completed: Yes Signs or symptoms of abuse/neglect since last visito No Patient Requires Transmission-Based Precautions: No Hospitalized since last visit: No Patient Has Alerts: No Implantable device outside of the clinic excluding No cellular tissue based products placed in the center since last visit: Has Dressing in Place as Prescribed: Yes Pain Present Now: No Electronic Signature(s) Signed: 02/01/2020 3:42:07 PM By: Carlene Coria RN Entered By: Carlene Coria on 01/31/2020 10:44:59 -------------------------------------------------------------------------------- Clinic Level of Care Assessment Details Patient Name: Date of Service: [name] 01/31/2020 10:45 [place] Record Number: [id number] Patient Account Number: [id number] Date of Birth/Sex: Treating RN: 1922-07-25 (86 y.o. [name] Primary Care Connor Meacham: Hollace Kinnier Other Clinician: Referring Leilana Mcquire: Treating Jacqueli Pangallo/Extender: Frederick Peers in Treatment: 15 Clinic Level of Care Assessment Items TOOL 4  Quantity Score X- 1 0 Use when only an EandM is performed on FOLLOW-UP visit ASSESSMENTS - Nursing Assessment / Reassessment X- 1 10 Reassessment of Co-morbidities (includes updates in patient status) X- 1 5 Reassessment of Adherence to Treatment Plan ASSESSMENTS - Wound and Skin A ssessment / Reassessment X - Simple Wound Assessment / Reassessment - one wound 1 5 []  - 0 Complex Wound Assessment / Reassessment - multiple wounds []  - 0 Dermatologic / Skin Assessment (not related to wound area) ASSESSMENTS - Focused Assessment []  - 0 Circumferential Edema Measurements - multi extremities []  - 0 Nutritional Assessment / Counseling / Intervention []  - 0 Lower Extremity Assessment (monofilament, tuning fork, pulses) []  - 0 Peripheral Arterial Disease Assessment (using hand held doppler) ASSESSMENTS - Ostomy and/or Continence Assessment and Care []  - 0 Incontinence Assessment and Management []  - 0 Ostomy Care Assessment and Management (repouching, etc.) PROCESS - Coordination of Care X - Simple Patient / Family Education for ongoing care 1 15 []  - 0 Complex (extensive) Patient / Family Education for ongoing care X- 1 10 Staff obtains Programmer, systems, Records, T Results / Process Orders est []  - 0 Staff telephones HHA, Nursing Homes / Clarify orders / etc []  - 0 Routine Transfer to another Facility (non-emergent condition) []  - 0 Routine Hospital Admission (non-emergent condition) []  - 0 New Admissions / Biomedical engineer / Ordering NPWT Apligraf, etc. , []  - 0 Emergency Hospital Admission (emergent condition) X- 1 10 Simple Discharge Coordination []  - 0 Complex (extensive) Discharge Coordination PROCESS - Special Needs []  - 0 Pediatric / Minor Patient Management []  - 0 Isolation Patient Management []  - 0 Hearing / Language / Visual special needs []  - 0 Assessment of Community assistance (transportation, D/C planning, etc.) []  - 0 Additional assistance / Altered  mentation []  - 0 Support Surface(s) Assessment (bed, cushion, seat, etc.) INTERVENTIONS - Wound Cleansing / Measurement X - Simple Wound Cleansing - one wound 1  5 []  - 0 Complex Wound Cleansing - multiple wounds X- 1 5 Wound Imaging (photographs - any number of wounds) []  - 0 Wound Tracing (instead of photographs) X- 1 5 Simple Wound Measurement - one wound []  - 0 Complex Wound Measurement - multiple wounds INTERVENTIONS - Wound Dressings X - Small Wound Dressing one or multiple wounds 1 10 []  - 0 Medium Wound Dressing one or multiple wounds []  - 0 Large Wound Dressing one or multiple wounds X- 1 5 Application of Medications - topical []  - 0 Application of Medications - injection INTERVENTIONS - Miscellaneous []  - 0 External ear exam []  - 0 Specimen Collection (cultures, biopsies, blood, body fluids, etc.) X- 1 5 Specimen(s) / Culture(s) sent or taken to Lab for analysis []  - 0 Patient Transfer (multiple staff / Civil Service fast streamer / Similar devices) []  - 0 Simple Staple / Suture removal (25 or less) []  - 0 Complex Staple / Suture removal (26 or more) []  - 0 Hypo / Hyperglycemic Management (close monitor of Blood Glucose) []  - 0 Ankle / Brachial Index (ABI) - do not check if billed separately X- 1 5 Vital Signs Has the patient been seen at the hospital within the last three years: Yes Total Score: 95 Level Of Care: New/Established - Level 3 Electronic Signature(s) Signed: 02/01/2020 4:42:40 PM By: Levan Hurst RN, BSN Entered By: Levan Hurst on 01/31/2020 13:10:54 -------------------------------------------------------------------------------- Complex / Palliative Patient Assessment Details Patient Name: Date of Service: [facility] [name] 01/31/2020 10:45 A M Medical Record Number: [id number] Patient Account Number: [id number] Date of Birth/Sex: Treating RN: November 09, 1921 (86 y.o. [name] Primary Care Kaidyn Javid: Hollace Kinnier Other Clinician: Referring  Shaquanna Lycan: Treating Jerimie Mancuso/Extender: Frederick Peers in Treatment: 15 Palliative Management Criteria Complex Wound Management Criteria Patient has remarkable or complex co-morbidities requiring medications or treatments that extend wound healing times. Examples: Diabetes mellitus with chronic renal failure or end stage renal disease requiring dialysis Advanced or poorly controlled rheumatoid arthritis Diabetes mellitus and end stage chronic obstructive pulmonary disease Active cancer with current chemo- or radiation therapy Type 2 Diabetes, CHF, CKD, Vascular Dementia, non ambulatory Care Approach Wound Care Plan: Complex Wound Management Electronic Signature(s) Signed: 02/01/2020 4:42:40 PM By: Levan Hurst RN, BSN Signed: 02/01/2020 5:03:02 PM By: Linton Ham MD Entered By: Levan Hurst on 02/01/2020 10:24:57 -------------------------------------------------------------------------------- Encounter Discharge Information Details Patient Name: Date of Service: [facility] RONI, SCOW 01/31/2020 10:45 A M Medical Record Number: [id number] Patient Account Number: [id number] Date of Birth/Sex: Treating RN: 05/29/1922 (86 y.o. [name] Primary Care Kymere Fullington: Hollace Kinnier Other Clinician: Referring Marq Rebello: Treating Marik Sedore/Extender: Frederick Peers in Treatment: 15 Encounter Discharge Information Items Discharge Condition: Stable Ambulatory Status: Wheelchair Discharge Destination: Home Transportation: Other Accompanied By: Charolett Bumpers Schedule Follow-up Appointment: Yes Clinical Summary of Care: Patient Declined Notes transportation service Electronic Signature(s) Signed: 01/31/2020 6:06:33 PM By: Baruch Gouty RN, BSN Entered By: Baruch Gouty on 01/31/2020 11:58:52 -------------------------------------------------------------------------------- Multi Wound Chart Details Patient Name: Date of Service: [facility] ELIS, SAUBER 01/31/2020 10:45  A M Medical Record Number: [id number] Patient Account Number: [id number] Date of Birth/Sex: Treating RN: 11-Apr-1922 (86 y.o. [name] Primary Care Breckin Zafar: Hollace Kinnier Other Clinician: Referring Tate Jerkins: Treating Meyer Dockery/Extender: Frederick Peers in Treatment: 15 Vital Signs Height(in): 71 Pulse(bpm): 102 Weight(lbs): 175 Blood Pressure(mmHg): 124/61 Body Mass Index(BMI): 24 Temperature(F): 98.6 Respiratory Rate(breaths/min): 20 Photos: [1:No Photos Sacrum] [N/A:N/A N/A] Wound Location: [1:Pressure Injury] [N/A:N/A] Wounding Event: [1:Pressure Ulcer] [N/A:N/A] Primary  Etiology: [1:Glaucoma, Chronic Obstructive] [N/A:N/A] Comorbid History: [1:Pulmonary Disease (COPD), Coronary Artery Disease, Hypertension, Type II Diabetes, Osteoarthritis, Dementia 09/03/2019] [N/A:N/A] Date Acquired: [1:15] [N/A:N/A] Weeks of Treatment: [1:Open] [N/A:N/A] Wound Status: [1:4.6x2x2.4] [N/A:N/A] Measurements L x W x D (cm) [1:7.226] [N/A:N/A] A (cm) : rea [1:17.342] [N/A:N/A] Volume (cm) : [1:17.10%] [N/A:N/A] % Reduction in A rea: [1:-17.00%] [N/A:N/A] % Reduction in Volume: [1:12] Starting Position 1 (o'clock): [1:6] Ending Position 1 (o'clock): [1:3.8] Maximum Distance 1 (cm): [1:Yes] [N/A:N/A] Undermining: [1:Category/Stage IV] [N/A:N/A] Classification: [1:Medium] [N/A:N/A] Exudate A mount: [1:Serosanguineous] [N/A:N/A] Exudate Type: [1:red, brown] [N/A:N/A] Exudate Color: [1:Well defined, not attached] [N/A:N/A] Wound Margin: [1:Large (67-100%)] [N/A:N/A] Granulation A mount: [1:Red, Friable] [N/A:N/A] Granulation Quality: [1:Small (1-33%)] [N/A:N/A] Necrotic A mount: [1:Fat Layer (Subcutaneous Tissue)] [N/A:N/A] Exposed Structures: [1:Exposed: Yes Muscle: Yes Bone: Yes Fascia: No Tendon: No Joint: No Small (1-33%)] [N/A:N/A] Treatment Notes Electronic Signature(s) Signed: 01/31/2020 5:47:00 PM By: Linton Ham MD Signed: 02/01/2020 4:42:40 PM By:  Levan Hurst RN, BSN Entered By: Linton Ham on 01/31/2020 11:29:20 -------------------------------------------------------------------------------- Multi-Disciplinary Care Plan Details Patient Name: Date of Service: [facility] TRAVARUS, TRUDO 01/31/2020 10:45 A M Medical Record Number: [id number] Patient Account Number: [id number] Date of Birth/Sex: Treating RN: Aug 18, 1921 (86 y.o. [name] Primary Care Jaydan Meidinger: Hollace Kinnier Other Clinician: Referring Sholanda Croson: Treating Paw Karstens/Extender: Frederick Peers in Treatment: 15 Active Inactive Wound/Skin Impairment Nursing Diagnoses: Impaired tissue integrity Knowledge deficit related to ulceration/compromised skin integrity Goals: Patient/caregiver will verbalize understanding of skin care regimen Date Initiated: 10/17/2019 Target Resolution Date: 02/29/2020 Goal Status: Active Ulcer/skin breakdown will have a volume reduction of 30% by week 4 Date Initiated: 10/17/2019 Date Inactivated: 12/28/2019 Target Resolution Date: 12/28/2019 Goal Status: Unmet Unmet Reason: chronic wound Interventions: Assess patient/caregiver ability to obtain necessary supplies Assess patient/caregiver ability to perform ulcer/skin care regimen upon admission and as needed Assess ulceration(s) every visit Provide education on ulcer and skin care Treatment Activities: Skin care regimen initiated : 10/17/2019 Topical wound management initiated : 10/17/2019 Notes: Electronic Signature(s) Signed: 02/01/2020 4:42:40 PM By: Levan Hurst RN, BSN Entered By: Levan Hurst on 01/31/2020 11:19:52 -------------------------------------------------------------------------------- Pain Assessment Details Patient Name: Date of Service: KEIDEN, DESKIN 01/31/2020 10:45 A M Medical Record Number: [id number] Patient Account Number: [id number] Date of Birth/Sex: Treating RN: 04-11-1922 (86 y.o. Oval Linsey Primary Care TRUE Garciamartinez: Hollace Kinnier Other Clinician: Referring Carol Theys: Treating Yudith Norlander/Extender: Frederick Peers in Treatment: 15 Active Problems Location of Pain Severity and Description of Pain Patient Has Paino No Site Locations Pain Management and Medication Current Pain Management: Electronic Signature(s) Signed: 02/01/2020 3:42:07 PM By: Carlene Coria RN Entered By: Carlene Coria on 01/31/2020 10:45:38 -------------------------------------------------------------------------------- Patient/Caregiver Education Details Patient Name: Date of Service: [facility] 7/1/2021andnbsp10:45 A M Medical Record Number: [id number] Patient Account Number: [id number] Date of Birth/Gender: Treating RN: [birth year] (86 y.o. [name] Primary Care Physician: Hollace Kinnier Other Clinician: Referring Physician: Treating Physician/Extender: Frederick Peers in Treatment: 15 Education Assessment Education Provided To: Patient Education Topics Provided Wound/Skin Impairment: Methods: Explain/Verbal Responses: State content correctly Electronic Signature(s) Signed: 02/01/2020 4:42:40 PM By: Levan Hurst RN, BSN Entered By: Levan Hurst on 01/31/2020 11:20:08 -------------------------------------------------------------------------------- Wound Assessment Details Patient Name: Date of Service: [facility] RASHI, GIULIANI 01/31/2020 10:45 A M Medical Record Number: [id number] Patient Account Number: [id number] Date of Birth/Sex: Treating RN: 12/26/1921 (86 y.o. Oval Linsey Primary Care Jennipher Weatherholtz: Hollace Kinnier Other Clinician: Referring Amiee Wiley: Treating Thersia Petraglia/Extender: Frederick Peers in Treatment:  15 Wound Status Wound Number: 1 Primary Pressure Ulcer Etiology: Wound Location: Sacrum Wound Open Wounding Event: Pressure Injury Status: Date Acquired: 09/03/2019 Comorbid Glaucoma, Chronic Obstructive Pulmonary Disease (COPD), Weeks Of  Treatment: 15 History: Coronary Artery Disease, Hypertension, Type II Diabetes, Clustered Wound: No Osteoarthritis, Dementia Wound Measurements Length: (cm) 4.6 Width: (cm) 2 Depth: (cm) 2.4 Area: (cm) 7.226 Volume: (cm) 17.342 % Reduction in Area: 17.1% % Reduction in Volume: -17% Epithelialization: Small (1-33%) Tunneling: No Undermining: Yes Starting Position (o'clock): 12 Ending Position (o'clock): 6 Maximum Distance: (cm) 3.8 Wound Description Classification: Category/Stage IV Wound Margin: Well defined, not attached Exudate Amount: Medium Exudate Type: Serosanguineous Exudate Color: red, brown Foul Odor After Cleansing: No Slough/Fibrino Yes Wound Bed Granulation Amount: Large (67-100%) Exposed Structure Granulation Quality: Red, Friable Fascia Exposed: No Necrotic Amount: Small (1-33%) Fat Layer (Subcutaneous Tissue) Exposed: Yes Necrotic Quality: Adherent Slough Tendon Exposed: No Muscle Exposed: Yes Necrosis of Muscle: No Joint Exposed: No Bone Exposed: Yes Treatment Notes Wound #1 (Sacrum) 2. Periwound Care Skin Prep 3. Primary Dressing Applied [employer] Other primary dressing (specifiy in notes) 4. Secondary Dressing Dry Gauze Other secondary dressing (specify in notes) Notes saline moistened gauze packing covered with tegaderm Electronic Signature(s) Signed: 02/01/2020 3:42:07 PM By: Carlene Coria RN Entered By: Carlene Coria on 01/31/2020 10:46:16 -------------------------------------------------------------------------------- Vitals Details Patient Name: Date of Service: [facility] QUANAH, MAJKA 01/31/2020 10:45 A M Medical Record Number: [id number] Patient Account Number: [id number] Date of Birth/Sex: Treating RN: May 31, 1922 (86 y.o. [name]) Carlene Coria Primary Care Drusilla Wampole: Hollace Kinnier Other Clinician: Referring Jenisa Monty: Treating Braniya Farrugia/Extender: Frederick Peers in Treatment: 15 Vital Signs Time Taken: 10:45 Temperature  (F): 98.6 Height (in): 71 Pulse (bpm): 102 Weight (lbs): 175 Respiratory Rate (breaths/min): 20 Body Mass Index (BMI): 24.4 Blood Pressure (mmHg): 124/61 Reference Range: 80 - 120 mg / dl Electronic Signature(s) Signed: 02/01/2020 3:42:07 PM By: Carlene Coria RN Entered By: Carlene Coria on 01/31/2020 10:45:30

## 2020-02-01 NOTE — Progress Notes (Signed)
[name], [name] ([id number]) Visit Report for 01/31/2020 HPI Details Patient Name: Date of Service: [name], [name] 01/31/2020 10:45 A M Medical Record Number: [id number] Patient Account Number: [id number] Date of Birth/Sex: Treating RN: [birth year] (86 y.o. [name] Primary Care Provider: Hollace Kinnier Other Clinician: Referring Provider: Treating Provider/Extender: Frederick Peers in Treatment: 15 History of Present Illness HPI Description: 10/17/2019 upon evaluation today patient presents for initial inspection here in our clinic concerning issues that has been having with a wound in the sacral region as well as the left heel. Fortunately neither appears to be overtly and significantly infected there is some necrotic tissue noted at both locations however that is going to be required to be addressed. He does have a history of diabetes mellitus type 2, vascular dementia, and hypertension. The patient did have a stroke in November 2020 unfortunately he has not been the same since that time according to his daughter. Prior to that he was taking care of himself now he is 100% dependent. He is at home with his daughter who is present during the office visit today as well that is where I get the majority of the history from at this point. Patient does have a history of hypertension as well which does not appear to be too out of control today which is good news. He does have a hemoglobin A1c of 6.05 May 2019. They currently have been using a calcium alginate dressing to the sacrum and it sounds like Skin-Prep and a foam on the heel. 4/9; pressure ulcers on the sacral area and left heel. Been using Anasept wet-to-dry on the sacrum and Hydrofera Blue in the heel. He has home health family is helping with the dressing. It sounds as though they are fairly religious about offloading these areas that have pressure relief surfaces for the bed. Wounds look better 4/30; I have not  seen this patient in 3 weeks. We have been using Anasept wet-to-dry the sacrum and Hydrofera Blue on the heel. When I saw this 3 weeks ago the sacrum actually look quite good and I thought that we might be able to transition him to a collagen-based dressing today. 5/10; the since the patient was last here he was hospitalized from 5/5 through 12/07/2019. He was noted to have a pressure injury of the sacral region stage IV. MRI did not suggest discitis or osteomyelitis. There was nonspecific bilateral paraspinal muscle edema. Culture I did when he was here the last time showed E. coli I believe they are aware of this. Blood cultures were negative. His white count was 10.8 on admission 11.9 at discharge his comprehensive metabolic panel notable for an albumin of 2.4 BUN of 32 with creatinine of 0.03 hemoglobin was only 7.3 with a discharge hemoglobin of 8.1 after dropping to a hemoglobin of 6.6 He is back at home. Discharge antibiotic was Augmentin.[name] He was treated with IV Rocephin in the hospital. The patient was seen by palliative care I have not looked at this consult. They have been using dressings that they had left over at home I think most recently [facility]. According to his daughter the patient is eating fairly well 12/28/19-Patient is back at 2 weeks, the sacral wound is about the same, the right heel has healed and closed. We are using silver alginate to the sacral wound 6/18; we continue to follow the sacral wound. His heel wounds have closed. We have been using silver alginate to the sacrum 7/1; sacral wound.  We have been using silver collagen with backing wet-to-dry. No open wounds on the heels. Electronic Signature(s) Signed: 01/31/2020 5:47:00 PM By: Linton Ham MD Entered By: Linton Ham on 01/31/2020 11:32:13 -------------------------------------------------------------------------------- Physical Exam Details Patient Name: Date of Service: [name], [name] 01/31/2020 10:45 A  M Medical Record Number: [id number] Patient Account Number: [id number] Date of Birth/Sex: Treating RN: [birth year] (86 y.o. [name] Primary Care Provider: Hollace Kinnier Other Clinician: Referring Provider: Treating Provider/Extender: Frederick Peers in Treatment: 15 Constitutional Sitting or standing Blood Pressure is within target range for patient.. Pulse regular and within target range for patient.[name] Respirations regular, non-labored and within target range.. Temperature is normal and within the target range for the patient.[name] Appears in no distress. Respiratory work of breathing is normal. Psychiatric Severe dementia. Notes Wound exam; sacral wound. There is very little undermining. Most of the tissue looks clean. There was some exposed calcification which I removed with pickups. Hemostasis with silver nitrate and direct pressure. I do not believe this was bone. There is no palpable bone no evidence of infection around the wound. Electronic Signature(s) Signed: 01/31/2020 5:47:00 PM By: Linton Ham MD Entered By: Linton Ham on 01/31/2020 11:33:56 -------------------------------------------------------------------------------- Physician Orders Details Patient Name: Date of Service: [name], [name] 01/31/2020 10:45 A M Medical Record Number: [id number] Patient Account Number: [id number] Date of Birth/Sex: Treating RN: [birth year] (86 y.o. [name] Primary Care Provider: Hollace Kinnier Other Clinician: Referring Provider: Treating Provider/Extender: Frederick Peers in Treatment: 15 Verbal / Phone Orders: No Diagnosis Coding ICD-10 Coding Code Description L89.154 Pressure ulcer of sacral region, stage 4 E11.621 Type 2 diabetes mellitus with foot ulcer L97.522 Non-pressure chronic ulcer of other part of left foot with fat layer exposed F01.50 Vascular dementia without behavioral disturbance I10 Essential (primary)  hypertension Follow-up Appointments ppointment in 2 weeks. - ***[facility] - ROOM 5*** Return A Dressing Change Frequency Wound #1 Sacrum Change Dressing every other day. Skin Barriers/Peri-Wound Care Wound #1 Sacrum Skin Prep - to periwound Wound Cleansing Wound #1 Sacrum Clean wound with Wound Cleanser Primary Wound Dressing Wound #1 Sacrum Silver Collagen - to base of wound - fill space with saline moistened gauze Secondary Dressing Wound #1 Sacrum ABD pad - secure with tegaderm to keep dressing from getting soiled Off-Loading Turn and reposition every 2 hours Other: - float heels with pillows under calves while in bed [place] skilled [facility] for wound care. - [facility] Electronic Signature(s) Signed: 01/31/2020 5:47:00 PM By: Linton Ham MD Signed: 02/01/2020 4:42:40 PM By: Levan Hurst RN, BSN Entered By: Levan Hurst on 01/31/2020 11:23:49 -------------------------------------------------------------------------------- Problem List Details Patient Name: Date of Service: [name], [name] 01/31/2020 10:45 A M Medical Record Number: [id number] Patient Account Number: [id number] Date of Birth/Sex: Treating RN: [birth year] (86 y.o. [name] Primary Care Provider: Hollace Kinnier Other Clinician: Referring Provider: Treating Provider/Extender: Frederick Peers in Treatment: 15 Active Problems ICD-10 Encounter Code Description Active Date MDM Diagnosis L89.154 Pressure ulcer of sacral region, stage 4 10/17/2019 No Yes E11.621 Type 2 diabetes mellitus with foot ulcer 10/17/2019 No Yes L97.522 Non-pressure chronic ulcer of other part of left foot with fat layer exposed 10/17/2019 No Yes F01.50 Vascular dementia without behavioral disturbance 10/17/2019 No Yes I10 Essential (primary) hypertension 10/17/2019 No Yes Inactive Problems Resolved Problems Electronic Signature(s) Signed: 01/31/2020 5:47:00 PM By: Linton Ham  MD Entered By: Linton Ham on 01/31/2020 11:29:10 -------------------------------------------------------------------------------- Progress Note Details Patient  Name: Date of Service: [name], [name] 01/31/2020 10:45 A M Medical Record Number: [id number] Patient Account Number: [id number] Date of Birth/Sex: Treating RN: [birth year] (86 y.o. [name] Primary Care Provider: Hollace Kinnier Other Clinician: Referring Provider: Treating Provider/Extender: Frederick Peers in Treatment: 15 Subjective History of Present Illness (HPI) 10/17/2019 upon evaluation today patient presents for initial inspection here in our clinic concerning issues that has been having with a wound in the sacral region as well as the left heel. Fortunately neither appears to be overtly and significantly infected there is some necrotic tissue noted at both locations however that is going to be required to be addressed. He does have a history of diabetes mellitus type 2, vascular dementia, and hypertension. The patient did have a stroke in November 2020 unfortunately he has not been the same since that time according to his daughter. Prior to that he was taking care of himself now he is 100% dependent. He is at home with his daughter who is present during the office visit today as well that is where I get the majority of the history from at this point. Patient does have a history of hypertension as well which does not appear to be too out of control today which is good news. He does have a hemoglobin A1c of 6.05 May 2019. They currently have been using a calcium alginate dressing to the sacrum and it sounds like Skin-Prep and a foam on the heel. 4/9; pressure ulcers on the sacral area and left heel. Been using Anasept wet-to-dry on the sacrum and Hydrofera Blue in the heel. He has home health family is helping with the dressing. It sounds as though they are fairly religious about offloading  these areas that have pressure relief surfaces for the bed. Wounds look better 4/30; I have not seen this patient in 3 weeks. We have been using Anasept wet-to-dry the sacrum and Hydrofera Blue on the heel. When I saw this 3 weeks ago the sacrum actually look quite good and I thought that we might be able to transition him to a collagen-based dressing today. 5/10; the since the patient was last here he was hospitalized from 5/5 through 12/07/2019. He was noted to have a pressure injury of the sacral region stage IV. MRI did not suggest discitis or osteomyelitis. There was nonspecific bilateral paraspinal muscle edema. Culture I did when he was here the last time showed E. coli I believe they are aware of this. Blood cultures were negative. His white count was 10.8 on admission 11.9 at discharge his comprehensive metabolic panel notable for an albumin of 2.4 BUN of 32 with creatinine of 0.03 hemoglobin was only 7.3 with a discharge hemoglobin of 8.1 after dropping to a hemoglobin of 6.6 He is back at home. Discharge antibiotic was Augmentin.[name] He was treated with IV Rocephin in the hospital. The patient was seen by palliative care I have not looked at this consult. They have been using dressings that they had left over at home I think most recently [facility]. According to his daughter the patient is eating fairly well 12/28/19-Patient is back at 2 weeks, the sacral wound is about the same, the right heel has healed and closed. We are using silver alginate to the sacral wound 6/18; we continue to follow the sacral wound. His heel wounds have closed. We have been using silver alginate to the sacrum 7/1; sacral wound. We have been using silver collagen with backing wet-to-dry. No  open wounds on the heels. Objective Constitutional Sitting or standing Blood Pressure is within target range for patient.. Pulse regular and within target range for patient.[name] Respirations regular, non-labored and within  target range.. Temperature is normal and within the target range for the patient.[name] Appears in no distress. Vitals Time Taken: 10:45 AM, Height: 71 in, Weight: 175 lbs, BMI: 24.4, Temperature: 98.6 F, Pulse: 102 bpm, Respiratory Rate: 20 breaths/min, Blood Pressure: 124/61 mmHg. Respiratory work of breathing is normal. Psychiatric Severe dementia. General Notes: Wound exam; sacral wound. There is very little undermining. Most of the tissue looks clean. There was some exposed calcification which I removed with pickups. Hemostasis with silver nitrate and direct pressure. I do not believe this was bone. There is no palpable bone no evidence of infection around the wound. Integumentary (Hair, Skin) Wound #1 status is Open. Original cause of wound was Pressure Injury. The wound is located on the Sacrum. The wound measures 4.6cm length x 2cm width x 2.4cm depth; 7.226cm^2 area and 17.342cm^3 volume. There is bone, muscle, and Fat Layer (Subcutaneous Tissue) Exposed exposed. There is no tunneling noted, however, there is undermining starting at 12:00 and ending at 6:00 with a maximum distance of 3.8cm. There is a medium amount of serosanguineous drainage noted. The wound margin is well defined and not attached to the wound base. There is large (67-100%) red, friable granulation within the wound bed. There is a small (1-33%) amount of necrotic tissue within the wound bed including Adherent Slough. Assessment Active Problems ICD-10 Pressure ulcer of sacral region, stage 4 Type 2 diabetes mellitus with foot ulcer Non-pressure chronic ulcer of other part of left foot with fat layer exposed Vascular dementia without behavioral disturbance Essential (primary) hypertension Plan Follow-up Appointments: Return Appointment in 2 weeks. - ***[facility] - ROOM 5*** Dressing Change Frequency: Wound #1 Sacrum: Change Dressing every other day. Skin Barriers/Peri-Wound Care: Wound #1 Sacrum: Skin Prep - to  periwound Wound Cleansing: Wound #1 Sacrum: Clean wound with Wound Cleanser Primary Wound Dressing: Wound #1 Sacrum: Silver Collagen - to base of wound - fill space with saline moistened gauze Secondary Dressing: Wound #1 Sacrum: ABD pad - secure with tegaderm to keep dressing from getting soiled Off-Loading: Turn and reposition every 2 hours Other: - float heels with pillows under calves while in bed Home Health: [place] [facility] for wound care. - [facility]. Continue with silver collagen with backing wet-to-dry. Initially a stage IV ulcer 2. I am a bit surprised we have not really considered a wound VAC here. Maybe I was not following him for a period of time just did not make a connection however the wound surface is cleaned up quite a bit with previous dressings. I think he might be a candidate for a wound VAC. We will reach out to the home health company who is seeing him. Electronic Signature(s) Signed: 01/31/2020 5:47:00 PM By: Linton Ham MD Entered By: Linton Ham on 01/31/2020 11:35:00 -------------------------------------------------------------------------------- SuperBill Details Patient Name: Date of Service: [name], [name] 01/31/2020 Medical Record Number: [id number] Patient Account Number: [id number] Date of Birth/Sex: Treating RN: 1922/04/14 (86 y.o. [name] Primary Care Provider: Hollace Kinnier Other Clinician: Referring Provider: Treating Provider/Extender: Frederick Peers in Treatment: 15 Diagnosis Coding ICD-10 Codes Code Description [phone] Pressure ulcer of sacral region, stage 4 E11.621 Type 2 diabetes mellitus with foot ulcer L97.522 Non-pressure chronic ulcer of other part of left foot with fat layer exposed F01.50 Vascular dementia without  behavioral disturbance I10 Essential (primary) hypertension Facility Procedures CPT4 Code: [id number] Description: [phone] - WOUND CARE VISIT-LEV 3 EST  PT Modifier: Quantity: 1 Physician Procedures : CPT4 Code Description Modifier [id number] - WC PHYS LEVEL 3 - EST PT ICD-10 Diagnosis Description L89.154 Pressure ulcer of sacral region, stage 4 Quantity: 1 Electronic Signature(s) Signed: 01/31/2020 5:47:00 PM By: Linton Ham MD Signed: 02/01/2020 4:42:40 PM By: Levan Hurst RN, BSN Signed: 02/01/2020 4:42:40 PM By: Levan Hurst RN, BSN Entered By: Levan Hurst on 01/31/2020 13:11:32

## 2020-02-02 DIAGNOSIS — R2689 Other abnormalities of gait and mobility: Secondary | ICD-10-CM | POA: Diagnosis not present

## 2020-02-02 DIAGNOSIS — L89153 Pressure ulcer of sacral region, stage 3: Secondary | ICD-10-CM | POA: Diagnosis not present

## 2020-02-05 DIAGNOSIS — I5032 Chronic diastolic (congestive) heart failure: Secondary | ICD-10-CM | POA: Diagnosis not present

## 2020-02-05 DIAGNOSIS — I2699 Other pulmonary embolism without acute cor pulmonale: Secondary | ICD-10-CM | POA: Diagnosis not present

## 2020-02-05 DIAGNOSIS — I130 Hypertensive heart and chronic kidney disease with heart failure and stage 1 through stage 4 chronic kidney disease, or unspecified chronic kidney disease: Secondary | ICD-10-CM | POA: Diagnosis not present

## 2020-02-05 DIAGNOSIS — E1122 Type 2 diabetes mellitus with diabetic chronic kidney disease: Secondary | ICD-10-CM | POA: Diagnosis not present

## 2020-02-05 DIAGNOSIS — F0391 Unspecified dementia with behavioral disturbance: Secondary | ICD-10-CM | POA: Diagnosis not present

## 2020-02-05 DIAGNOSIS — L89154 Pressure ulcer of sacral region, stage 4: Secondary | ICD-10-CM | POA: Diagnosis not present

## 2020-02-05 DIAGNOSIS — M6281 Muscle weakness (generalized): Secondary | ICD-10-CM | POA: Diagnosis not present

## 2020-02-05 DIAGNOSIS — L89626 Pressure-induced deep tissue damage of left heel: Secondary | ICD-10-CM | POA: Diagnosis not present

## 2020-02-05 DIAGNOSIS — N183 Chronic kidney disease, stage 3 unspecified: Secondary | ICD-10-CM | POA: Diagnosis not present

## 2020-02-07 DIAGNOSIS — U071 COVID-19: Secondary | ICD-10-CM | POA: Diagnosis not present

## 2020-02-08 ENCOUNTER — Telehealth: Payer: Self-pay

## 2020-02-08 MED ORDER — LORATADINE 10 MG PO CAPS: 1.0000 | ORAL_CAPSULE | Freq: Every day | ORAL | 0 refills | Status: AC

## 2020-02-08 MED ORDER — SIMVASTATIN 20 MG PO TABS: 20.0000 mg | ORAL_TABLET | Freq: Every day | ORAL | 0 refills | Status: DC

## 2020-02-08 NOTE — Telephone Encounter (Signed)
Sounds good. Thanks 

## 2020-02-08 NOTE — Telephone Encounter (Signed)
Patients daughter [name] called requesting a shot-term refill on Loratadine and Simvastatin.   Patient will establish care with a new PCP on 7/29/201. The new PCP will not refill any medications until patient actually establish. [name] is asking for enough pills to get him through until appointment.  #20 pills sent for both medications to local pharmacy. Future refills will come from new PCP.  I will send message to Dr.Reed to assure this action was appropriate and no further action required.

## 2020-02-14 ENCOUNTER — Encounter (HOSPITAL_BASED_OUTPATIENT_CLINIC_OR_DEPARTMENT_OTHER): Payer: Medicare HMO | Admitting: Internal Medicine

## 2020-02-14 DIAGNOSIS — Z8673 Personal history of transient ischemic attack (TIA), and cerebral infarction without residual deficits: Secondary | ICD-10-CM | POA: Diagnosis not present

## 2020-02-14 DIAGNOSIS — F0150 Vascular dementia without behavioral disturbance: Secondary | ICD-10-CM | POA: Diagnosis not present

## 2020-02-14 DIAGNOSIS — L89154 Pressure ulcer of sacral region, stage 4: Secondary | ICD-10-CM | POA: Diagnosis not present

## 2020-02-14 DIAGNOSIS — I10 Essential (primary) hypertension: Secondary | ICD-10-CM | POA: Diagnosis not present

## 2020-02-14 DIAGNOSIS — E11622 Type 2 diabetes mellitus with other skin ulcer: Secondary | ICD-10-CM | POA: Diagnosis not present

## 2020-02-14 DIAGNOSIS — E1169 Type 2 diabetes mellitus with other specified complication: Secondary | ICD-10-CM | POA: Diagnosis not present

## 2020-02-14 DIAGNOSIS — E11621 Type 2 diabetes mellitus with foot ulcer: Secondary | ICD-10-CM | POA: Diagnosis not present

## 2020-02-14 DIAGNOSIS — L97522 Non-pressure chronic ulcer of other part of left foot with fat layer exposed: Secondary | ICD-10-CM | POA: Diagnosis not present

## 2020-02-15 DIAGNOSIS — F0391 Unspecified dementia with behavioral disturbance: Secondary | ICD-10-CM | POA: Diagnosis not present

## 2020-02-15 DIAGNOSIS — M6281 Muscle weakness (generalized): Secondary | ICD-10-CM | POA: Diagnosis not present

## 2020-02-15 DIAGNOSIS — I5032 Chronic diastolic (congestive) heart failure: Secondary | ICD-10-CM | POA: Diagnosis not present

## 2020-02-15 DIAGNOSIS — N183 Chronic kidney disease, stage 3 unspecified: Secondary | ICD-10-CM | POA: Diagnosis not present

## 2020-02-15 DIAGNOSIS — E1122 Type 2 diabetes mellitus with diabetic chronic kidney disease: Secondary | ICD-10-CM | POA: Diagnosis not present

## 2020-02-15 DIAGNOSIS — I130 Hypertensive heart and chronic kidney disease with heart failure and stage 1 through stage 4 chronic kidney disease, or unspecified chronic kidney disease: Secondary | ICD-10-CM | POA: Diagnosis not present

## 2020-02-15 DIAGNOSIS — L89154 Pressure ulcer of sacral region, stage 4: Secondary | ICD-10-CM | POA: Diagnosis not present

## 2020-02-15 DIAGNOSIS — L89626 Pressure-induced deep tissue damage of left heel: Secondary | ICD-10-CM | POA: Diagnosis not present

## 2020-02-15 DIAGNOSIS — I2699 Other pulmonary embolism without acute cor pulmonale: Secondary | ICD-10-CM | POA: Diagnosis not present

## 2020-02-18 NOTE — Progress Notes (Signed)
[name] ([id number]) Visit Report for 02/14/2020 HPI Details Patient Name: Date of Service: [name], [name] 02/14/2020 1:15 PM Medical Record Number: [id number] Patient Account Number: [id number] Date of Birth/Sex: Treating RN: 12-27-1921 (86 y.o. [name] Primary Care Provider: Hollace Kinnier Other Clinician: Referring Provider: Treating Provider/Extender: Frederick Peers in Treatment: 17 History of Present Illness HPI Description: 10/17/2019 upon evaluation today patient presents for initial inspection here in our clinic concerning issues that has been having with a wound in the sacral region as well as the left heel. Fortunately neither appears to be overtly and significantly infected there is some necrotic tissue noted at both locations however that is going to be required to be addressed. He does have a history of diabetes mellitus type 2, vascular dementia, and hypertension. The patient did have a stroke in November 2020 unfortunately he has not been the same since that time according to his daughter. Prior to that he was taking care of himself now he is 100% dependent. He is at home with his daughter who is present during the office visit today as well that is where I get the majority of the history from at this point. Patient does have a history of hypertension as well which does not appear to be too out of control today which is good news. He does have a hemoglobin A1c of 6.05 May 2019. They currently have been using a calcium alginate dressing to the sacrum and it sounds like Skin-Prep and a foam on the heel. 4/9; pressure ulcers on the sacral area and left heel. Been using Anasept wet-to-dry on the sacrum and Hydrofera Blue in the heel. He has home health family is helping with the dressing. It sounds as though they are fairly religious about offloading these areas that have pressure relief surfaces for the bed. Wounds look better 4/30; I have not  seen this patient in 3 weeks. We have been using Anasept wet-to-dry the sacrum and Hydrofera Blue on the heel. When I saw this 3 weeks ago the sacrum actually look quite good and I thought that we might be able to transition him to a collagen-based dressing today. 5/10; the since the patient was last here he was hospitalized from 5/5 through 12/07/2019. He was noted to have a pressure injury of the sacral region stage IV. MRI did not suggest discitis or osteomyelitis. There was nonspecific bilateral paraspinal muscle edema. Culture I did when he was here the last time showed E. coli I believe they are aware of this. Blood cultures were negative. His white count was 10.8 on admission 11.9 at discharge his comprehensive metabolic panel notable for an albumin of 2.4 BUN of 32 with creatinine of 0.03 hemoglobin was only 7.3 with a discharge hemoglobin of 8.1 after dropping to a hemoglobin of 6.6 He is back at home. Discharge antibiotic was Augmentin.[name] He was treated with IV Rocephin in the hospital. The patient was seen by palliative care I have not looked at this consult. They have been using dressings that they had left over at home I think most recently [facility]. According to his daughter the patient is eating fairly well 12/28/19-Patient is back at 2 weeks, the sacral wound is about the same, the right heel has healed and closed. We are using silver alginate to the sacral wound 6/18; we continue to follow the sacral wound. His heel wounds have closed. We have been using silver alginate to the sacrum 7/1; sacral wound. We  have been using silver collagen with backing wet-to-dry. No open wounds on the heels. 7/15; some improvement in the dimensions of the sacral wound including the undermining area we have been using silver collagen with backing wet-to-dry dressings. We had some thoughts about trying a wound VAC. He has home health. Electronic Signature(s) Signed: 02/14/2020 5:51:19 PM By: Linton Ham MD Entered By: Linton Ham on 02/14/2020 14:08:57 -------------------------------------------------------------------------------- Physical Exam Details Patient Name: Date of Service: [name], [name] 02/14/2020 1:15 PM Medical Record Number: [id number] Patient Account Number: [id number] Date of Birth/Sex: Treating RN: [birth year] (86 y.o. [name] Primary Care Provider: Hollace Kinnier Other Clinician: Referring Provider: Treating Provider/Extender: Frederick Peers in Treatment: 17 Cardiovascular Not dehydrated. Psychiatric Advanced dementia. Notes Wound exam; sacral wound. Undermining is come in 4-5 o'clock. Most of the tissue looks clean no exposed bone no evidence of infection. Tissue looks fairly healthy. Electronic Signature(s) Signed: 02/14/2020 5:51:19 PM By: Linton Ham MD Entered By: Linton Ham on 02/14/2020 14:11:50 -------------------------------------------------------------------------------- Physician Orders Details Patient Name: Date of Service: [address] [name] 02/14/2020 1:15 PM Medical Record Number: [id number] Patient Account Number: [id number] Date of Birth/Sex: Treating RN: [birth year] (86 y.o. [name] Primary Care Provider: Hollace Kinnier Other Clinician: Referring Provider: Treating Provider/Extender: Frederick Peers in Treatment: 17 Verbal / Phone Orders: No Diagnosis Coding ICD-10 Coding Code Description L89.154 Pressure ulcer of sacral region, stage 4 E11.621 Type 2 diabetes mellitus with foot ulcer L97.522 Non-pressure chronic ulcer of other part of left foot with fat layer exposed F01.50 Vascular dementia without behavioral disturbance I10 Essential (primary) hypertension Follow-up Appointments ppointment in 2 weeks. - ***[facility] - ROOM 5*** (Monday or Tuesday) Return A Dressing Change Frequency Wound #1 Sacrum Change dressing three times week. Skin Barriers/Peri-Wound  Care Wound #1 Sacrum Skin Prep - to periwound Wound Cleansing Wound #1 Sacrum Clean wound with Wound Cleanser Primary Wound Dressing Wound #1 Sacrum Silver Collagen - to base of wound - fill space with saline moistened gauze Other: - home health to initiate wound vac once available Secondary Dressing Wound #1 Sacrum ABD pad - secure with tegaderm to keep dressing from getting soiled Negative Presssure Wound Therapy Wound #1 Sacrum Medela Wound Vac continuously at 127mm/hg - **Wound clinic will order - home health to initiate and change 3 times a week once available** Black Foam Off-Loading Low air-loss mattress (Group 2) Turn and reposition every 2 hours Other: - float heels with pillows under calves while in bed [facility] [facility] for wound care. - [facility] Electronic Signature(s) Signed: 02/14/2020 5:51:19 PM By: Linton Ham MD Signed: 02/18/2020 4:12:59 PM By: Levan Hurst RN, BSN Entered By: Levan Hurst on 02/14/2020 14:10:09 -------------------------------------------------------------------------------- Problem List Details Patient Name: Date of Service: [facility] [place], [place] 02/14/2020 1:15 PM Medical Record Number: [id number] Patient Account Number: [id number] Date of Birth/Sex: Treating RN: [birth year] (86 y.o. [name] Primary Care Provider: Hollace Kinnier Other Clinician: Referring Provider: Treating Provider/Extender: Frederick Peers in Treatment: 17 Active Problems ICD-10 Encounter Code Description Active Date MDM Diagnosis L89.154 Pressure ulcer of sacral region, stage 4 10/17/2019 No Yes E11.621 Type 2 diabetes mellitus with foot ulcer 10/17/2019 No Yes L97.522 Non-pressure chronic ulcer of other part of left foot with fat layer exposed 10/17/2019 No Yes F01.50 Vascular dementia without behavioral disturbance 10/17/2019 No Yes I10 Essential (primary) hypertension 10/17/2019 No Yes Inactive  Problems Resolved Problems Electronic Signature(s) Signed: 02/14/2020 5:51:19 PM By:  Linton Ham MD Entered By: Linton Ham on 02/14/2020 14:07:58 -------------------------------------------------------------------------------- Progress Note Details Patient Name: Date of Service: [name], [name] 02/14/2020 1:15 PM Medical Record Number: [id number] Patient Account Number: [id number] Date of Birth/Sex: Treating RN: Feb 08, 1922 (86 y.o. [name] Primary Care Provider: Hollace Kinnier Other Clinician: Referring Provider: Treating Provider/Extender: Frederick Peers in Treatment: 17 Subjective History of Present Illness (HPI) 10/17/2019 upon evaluation today patient presents for initial inspection here in our clinic concerning issues that has been having with a wound in the sacral region as well as the left heel. Fortunately neither appears to be overtly and significantly infected there is some necrotic tissue noted at both locations however that is going to be required to be addressed. He does have a history of diabetes mellitus type 2, vascular dementia, and hypertension. The patient did have a stroke in November 2020 unfortunately he has not been the same since that time according to his daughter. Prior to that he was taking care of himself now he is 100% dependent. He is at home with his daughter who is present during the office visit today as well that is where I get the majority of the history from at this point. Patient does have a history of hypertension as well which does not appear to be too out of control today which is good news. He does have a hemoglobin A1c of 6.05 May 2019. They currently have been using a calcium alginate dressing to the sacrum and it sounds like Skin-Prep and a foam on the heel. 4/9; pressure ulcers on the sacral area and left heel. Been using Anasept wet-to-dry on the sacrum and Hydrofera Blue in the heel. He has home health  family is helping with the dressing. It sounds as though they are fairly religious about offloading these areas that have pressure relief surfaces for the bed. Wounds look better 4/30; I have not seen this patient in 3 weeks. We have been using Anasept wet-to-dry the sacrum and Hydrofera Blue on the heel. When I saw this 3 weeks ago the sacrum actually look quite good and I thought that we might be able to transition him to a collagen-based dressing today. 5/10; the since the patient was last here he was hospitalized from 5/5 through 12/07/2019. He was noted to have a pressure injury of the sacral region stage IV. MRI did not suggest discitis or osteomyelitis. There was nonspecific bilateral paraspinal muscle edema. Culture I did when he was here the last time showed E. coli I believe they are aware of this. Blood cultures were negative. His white count was 10.8 on admission 11.9 at discharge his comprehensive metabolic panel notable for an albumin of 2.4 BUN of 32 with creatinine of 0.03 hemoglobin was only 7.3 with a discharge hemoglobin of 8.1 after dropping to a hemoglobin of 6.6 He is back at home. Discharge antibiotic was Augmentin.[name] He was treated with IV Rocephin in the hospital. The patient was seen by palliative care I have not looked at this consult. They have been using dressings that they had left over at home I think most recently [facility]. According to his daughter the patient is eating fairly well 12/28/19-Patient is back at 2 weeks, the sacral wound is about the same, the right heel has healed and closed. We are using silver alginate to the sacral wound 6/18; we continue to follow the sacral wound. His heel wounds have closed. We have been using silver alginate to the  sacrum 7/1; sacral wound. We have been using silver collagen with backing wet-to-dry. No open wounds on the heels. 7/15; some improvement in the dimensions of the sacral wound including the undermining area we have  been using silver collagen with backing wet-to-dry dressings. We had some thoughts about trying a wound VAC. He has home health. Objective Constitutional Vitals Time Taken: 1:35 PM, Height: 71 in, Weight: 175 lbs, BMI: 24.4, Temperature: 98.0 F, Pulse: 78 bpm, Respiratory Rate: 20 breaths/min, Blood Pressure: 144/58 mmHg. Cardiovascular Not dehydrated. Psychiatric Advanced dementia. General Notes: Wound exam; sacral wound. Undermining is come in 4-5 o'clock. Most of the tissue looks clean no exposed bone no evidence of infection. Tissue looks fairly healthy. Integumentary (Hair, Skin) Wound #1 status is Open. Original cause of wound was Pressure Injury. The wound is located on the Sacrum. The wound measures 3.8cm length x 2.1cm width x 2.1cm depth; 6.267cm^2 area and 13.162cm^3 volume. There is muscle and Fat Layer (Subcutaneous Tissue) Exposed exposed. There is no tunneling noted, however, there is undermining starting at 12:00 and ending at 5:00 with a maximum distance of 3cm. There is a medium amount of serosanguineous drainage noted. The wound margin is well defined and not attached to the wound base. There is medium (34-66%) pink, pale, friable granulation within the wound bed. There is a medium (34-66%) amount of necrotic tissue within the wound bed including Adherent Slough. Assessment Active Problems ICD-10 Pressure ulcer of sacral region, stage 4 Type 2 diabetes mellitus with foot ulcer Non-pressure chronic ulcer of other part of left foot with fat layer exposed Vascular dementia without behavioral disturbance Essential (primary) hypertension Plan Follow-up Appointments: Return Appointment in 2 weeks. - ***[facility] - ROOM 5*** (Monday or Tuesday) Dressing Change Frequency: Wound #1 Sacrum: Change dressing three times week. Skin Barriers/Peri-Wound Care: Wound #1 Sacrum: Skin Prep - to periwound Wound Cleansing: Wound #1 Sacrum: Clean wound with Wound Cleanser Primary  Wound Dressing: Wound #1 Sacrum: Silver Collagen - to base of wound - fill space with saline moistened gauze Other: - home health to initiate wound vac once available Secondary Dressing: Wound #1 Sacrum: ABD pad - secure with tegaderm to keep dressing from getting soiled Negative Presssure Wound Therapy: Wound #1 Sacrum: Medela Wound Vac continuously at 168mm/hg - **Wound clinic will order - home health to initiate and change 3 times a week once available** Black Foam Off-Loading: Low air-loss mattress (Group 2) Turn and reposition every 2 hours Other: - float heels with pillows under calves while in bed Home Health: [facility] for wound care. - [facility]. I am continuing with silver collagen with backing wet-to-dry 2. I would like to try the wound VAC on him if he can tolerate it his daughter seems to think he can and if we have home health involvement Electronic Signature(s) Signed: 02/14/2020 5:51:19 PM By: Linton Ham MD Entered By: Linton Ham on 02/14/2020 14:12:29 -------------------------------------------------------------------------------- SuperBill Details Patient Name: Date of Service: [facility] [name], [name] 02/14/2020 Medical Record Number: [id number] Patient Account Number: [id number] Date of Birth/Sex: Treating RN: Mar 29, 1922 (86 y.o. [name] Primary Care Provider: Hollace Kinnier Other Clinician: Referring Provider: Treating Provider/Extender: Frederick Peers in Treatment: 17 Diagnosis Coding ICD-10 Codes Code Description [id number] Pressure ulcer of sacral region, stage 4 E11.621 Type 2 diabetes mellitus with foot ulcer L97.522 Non-pressure chronic ulcer of other part of left foot with fat layer exposed F01.50 Vascular dementia without behavioral disturbance I10 Essential (primary) hypertension Facility Procedures  CPT4 Code: [id number] Description: 67519 - WOUND CARE VISIT-LEV 3 EST  PT Modifier: Quantity: 1 Physician Procedures : CPT4 Code Description Modifier [id number] - WC PHYS LEVEL 3 - EST PT ICD-10 Diagnosis Description L89.154 Pressure ulcer of sacral region, stage 4 F01.50 Vascular dementia without behavioral disturbance Quantity: 1 Electronic Signature(s) Signed: 02/14/2020 5:51:19 PM By: Linton Ham MD Signed: 02/18/2020 4:12:59 PM By: Levan Hurst RN, BSN Entered By: Levan Hurst on 02/14/2020 17:37:12

## 2020-02-18 NOTE — Progress Notes (Signed)
[name], [name] ([id number]) Visit Report for 02/14/2020 Arrival Information Details Patient Name: Date of Service: [name], [name] 02/14/2020 1:15 PM Medical Record Number: [id number] Patient Account Number: [id number] Date of Birth/Sex: Treating RN: 27-Mar-1922 (86 y.o. [name] Primary Care Jenniffer Vessels: Hollace Kinnier Other Clinician: Referring Shaquille Janes: Treating Jadelynn Boylan/Extender: Frederick Peers in Treatment: 88 Visit Information History Since Last Visit Added or deleted any medications: No Patient Arrived: Wheel Chair Any new allergies or adverse reactions: No Arrival Time: 13:35 Had a fall or experienced change in No Accompanied By: daughter activities of daily living that may affect Transfer Assistance: Harrel Lemon Lift risk of falls: Patient Identification Verified: Yes Signs or symptoms of abuse/neglect since last visito No Secondary Verification Process Completed: Yes Hospitalized since last visit: No Patient Requires Transmission-Based Precautions: No Implantable device outside of the clinic excluding No Patient Has Alerts: No cellular tissue based products placed in the center since last visit: Has Dressing in Place as Prescribed: Yes Pain Present Now: No Electronic Signature(s) Signed: 02/18/2020 4:00:56 PM By: Sandre Kitty Entered By: Sandre Kitty on 02/14/2020 13:35:38 -------------------------------------------------------------------------------- Clinic Level of Care Assessment Details Patient Name: Date of Service: SAVIER, TRICKETT 02/14/2020 1:15 PM Medical Record Number: [id number] Patient Account Number: [id number] Date of Birth/Sex: Treating RN: 1922-03-22 (86 y.o. [name] Primary Care Cooper Stamp: Hollace Kinnier Other Clinician: Referring Hyun Marsalis: Treating Ahrianna Siglin/Extender: Frederick Peers in Treatment: 17 Clinic Level of Care Assessment Items TOOL 4 Quantity Score X- 1 0 Use when only an EandM is  performed on FOLLOW-UP visit ASSESSMENTS - Nursing Assessment / Reassessment X- 1 10 Reassessment of Co-morbidities (includes updates in patient status) X- 1 5 Reassessment of Adherence to Treatment Plan ASSESSMENTS - Wound and Skin A ssessment / Reassessment X - Simple Wound Assessment / Reassessment - one wound 1 5 []  - 0 Complex Wound Assessment / Reassessment - multiple wounds []  - 0 Dermatologic / Skin Assessment (not related to wound area) ASSESSMENTS - Focused Assessment []  - 0 Circumferential Edema Measurements - multi extremities []  - 0 Nutritional Assessment / Counseling / Intervention []  - 0 Lower Extremity Assessment (monofilament, tuning fork, pulses) []  - 0 Peripheral Arterial Disease Assessment (using hand held doppler) ASSESSMENTS - Ostomy and/or Continence Assessment and Care []  - 0 Incontinence Assessment and Management []  - 0 Ostomy Care Assessment and Management (repouching, etc.) PROCESS - Coordination of Care X - Simple Patient / Family Education for ongoing care 1 15 []  - 0 Complex (extensive) Patient / Family Education for ongoing care X- 1 10 Staff obtains Consents, Records, T Results / Process Orders est X- 1 10 Staff telephones HHA, Nursing Homes / Clarify orders / etc []  - 0 Routine Transfer to another Facility (non-emergent condition) []  - 0 Routine Hospital Admission (non-emergent condition) []  - 0 New Admissions / Biomedical engineer / Ordering NPWT Apligraf, etc. , []  - 0 Emergency Hospital Admission (emergent condition) X- 1 10 Simple Discharge Coordination []  - 0 Complex (extensive) Discharge Coordination PROCESS - Special Needs []  - 0 Pediatric / Minor Patient Management []  - 0 Isolation Patient Management []  - 0 Hearing / Language / Visual special needs []  - 0 Assessment of Community assistance (transportation, D/C planning, etc.) []  - 0 Additional assistance / Altered mentation []  - 0 Support Surface(s) Assessment  (bed, cushion, seat, etc.) INTERVENTIONS - Wound Cleansing / Measurement X - Simple Wound Cleansing - one wound 1 5 []  - 0 Complex Wound Cleansing - multiple wounds X-  1 5 Wound Imaging (photographs - any number of wounds) []  - 0 Wound Tracing (instead of photographs) X- 1 5 Simple Wound Measurement - one wound []  - 0 Complex Wound Measurement - multiple wounds INTERVENTIONS - Wound Dressings X - Small Wound Dressing one or multiple wounds 1 10 []  - 0 Medium Wound Dressing one or multiple wounds []  - 0 Large Wound Dressing one or multiple wounds X- 1 5 Application of Medications - topical []  - 0 Application of Medications - injection INTERVENTIONS - Miscellaneous []  - 0 External ear exam []  - 0 Specimen Collection (cultures, biopsies, blood, body fluids, etc.) []  - 0 Specimen(s) / Culture(s) sent or taken to Lab for analysis X- 1 10 Patient Transfer (multiple staff / Harrel Lemon Lift / Similar devices) []  - 0 Simple Staple / Suture removal (25 or less) []  - 0 Complex Staple / Suture removal (26 or more) []  - 0 Hypo / Hyperglycemic Management (close monitor of Blood Glucose) []  - 0 Ankle / Brachial Index (ABI) - do not check if billed separately X- 1 5 Vital Signs Has the patient been seen at the hospital within the last three years: Yes Total Score: 110 Level Of Care: New/Established - Level 3 Electronic Signature(s) Signed: 02/18/2020 4:12:59 PM By: Levan Hurst RN, BSN Entered By: Levan Hurst on 02/14/2020 17:37:01 -------------------------------------------------------------------------------- Encounter Discharge Information Details Patient Name: Date of Service: [address] [name], [name] 02/14/2020 1:15 PM Medical Record Number: [id number] Patient Account Number: [id number] Date of Birth/Sex: Treating RN: 09/09/1921 (86 y.o. [name] Primary Care Donnita Farina: Hollace Kinnier Other Clinician: Referring Callie Facey: Treating Chrisanne Loose/Extender: Frederick Peers in Treatment: 17 Encounter Discharge Information Items Discharge Condition: Stable Ambulatory Status: Wheelchair Discharge Destination: Home Transportation: Other Accompanied By: Charolett Bumpers Schedule Follow-up Appointment: Yes Clinical Summary of Care: Patient Declined Notes transportation service Electronic Signature(s) Signed: 02/14/2020 5:47:12 PM By: Baruch Gouty RN, BSN Entered By: Baruch Gouty on 02/14/2020 15:06:35 -------------------------------------------------------------------------------- Lower Extremity Assessment Details Patient Name: Date of Service: [facility] [name], [name] 02/14/2020 1:15 PM Medical Record Number: [id number] Patient Account Number: [id number] Date of Birth/Sex: Treating RN: 1922-01-27 (86 y.o. [name] Primary Care Aseem Sessums: Hollace Kinnier Other Clinician: Referring Adiel Mcnamara: Treating Madysun Thall/Extender: Frederick Peers in Treatment: 17 Electronic Signature(s) Signed: 02/18/2020 4:00:56 PM By: Sandre Kitty Signed: 02/18/2020 4:12:59 PM By: Levan Hurst RN, BSN Entered By: Sandre Kitty on 02/14/2020 13:36:16 -------------------------------------------------------------------------------- Multi Wound Chart Details Patient Name: Date of Service: [facility] [name], [name] 02/14/2020 1:15 PM Medical Record Number: [id number] Patient Account Number: [id number] Date of Birth/Sex: Treating RN: [birth year] (86 y.o. [name] Primary Care Trissa Molina: Hollace Kinnier Other Clinician: Referring Zoei Amison: Treating Cain Fitzhenry/Extender: Frederick Peers in Treatment: 17 Vital Signs Height(in): 71 Pulse(bpm): 88 Weight(lbs): 175 Blood Pressure(mmHg): 144/58 Body Mass Index(BMI): 24 Temperature(F): 98.0 Respiratory Rate(breaths/min): 20 Photos: [1:No Photos Sacrum] [N/A:N/A N/A] Wound Location: [1:Pressure Injury] [N/A:N/A] Wounding Event: [1:Pressure Ulcer] [N/A:N/A] Primary Etiology: [1:Glaucoma,  Chronic Obstructive] [N/A:N/A] Comorbid History: [1:Pulmonary Disease (COPD), Coronary Artery Disease, Hypertension, Type II Diabetes, Osteoarthritis, Dementia 09/03/2019] [N/A:N/A] Date Acquired: [1:17] [N/A:N/A] Weeks of Treatment: [1:Open] [N/A:N/A] Wound Status: [1:3.8x2.1x2.1] [N/A:N/A] Measurements L x W x D (cm) [1:6.267] [N/A:N/A] A (cm) : rea [1:13.162] [N/A:N/A] Volume (cm) : [1:28.10%] [N/A:N/A] % Reduction in A rea: [1:11.20%] [N/A:N/A] % Reduction in Volume: [1:12] Starting Position 1 (o'clock): [1:5] Ending Position 1 (o'clock): [1:3] Maximum Distance 1 (cm): [1:Yes] [N/A:N/A] Undermining: [1:Category/Stage IV] [N/A:N/A] Classification: [1:Medium] [N/A:N/A] Exudate A mount: [1:Serosanguineous] [  N/A:N/A] Exudate Type: [1:red, brown] [N/A:N/A] Exudate Color: [1:Well defined, not attached] [N/A:N/A] Wound Margin: [1:Medium (34-66%)] [N/A:N/A] Granulation A mount: [1:Pink, Pale, Friable] [N/A:N/A] Granulation Quality: [1:Medium (34-66%)] [N/A:N/A] Necrotic A mount: [1:Fat Layer (Subcutaneous Tissue)] [N/A:N/A] Exposed Structures: [1:Exposed: Yes Muscle: Yes Fascia: No Tendon: No Joint: No Bone: No Small (1-33%)] [N/A:N/A] Treatment Notes Electronic Signature(s) Signed: 02/14/2020 5:51:19 PM By: Linton Ham MD Signed: 02/18/2020 4:12:59 PM By: Levan Hurst RN, BSN Entered By: Linton Ham on 02/14/2020 14:08:06 -------------------------------------------------------------------------------- Multi-Disciplinary Care Plan Details Patient Name: Date of Service: [facility] [name], [name] 02/14/2020 1:15 PM Medical Record Number: [id number] Patient Account Number: [id number] Date of Birth/Sex: Treating RN: 1922-02-06 (86 y.o. [name] Primary Care Naquan Garman: Hollace Kinnier Other Clinician: Referring Charleene Callegari: Treating Jaymason Ledesma/Extender: Frederick Peers in Treatment: 17 Active Inactive Wound/Skin Impairment Nursing Diagnoses: Impaired tissue  integrity Knowledge deficit related to ulceration/compromised skin integrity Goals: Patient/caregiver will verbalize understanding of skin care regimen Date Initiated: 10/17/2019 Target Resolution Date: 02/29/2020 Goal Status: Active Ulcer/skin breakdown will have a volume reduction of 30% by week 4 Date Initiated: 10/17/2019 Date Inactivated: 12/28/2019 Target Resolution Date: 12/28/2019 Goal Status: Unmet Unmet Reason: chronic wound Interventions: Assess patient/caregiver ability to obtain necessary supplies Assess patient/caregiver ability to perform ulcer/skin care regimen upon admission and as needed Assess ulceration(s) every visit Provide education on ulcer and skin care Treatment Activities: Skin care regimen initiated : 10/17/2019 Topical wound management initiated : 10/17/2019 Notes: Electronic Signature(s) Signed: 02/18/2020 4:12:59 PM By: Levan Hurst RN, BSN Entered By: Levan Hurst on 02/14/2020 17:36:14 -------------------------------------------------------------------------------- Pain Assessment Details Patient Name: Date of Service: [name], [name] 02/14/2020 1:15 PM Medical Record Number: [id number] Patient Account Number: [id number] Date of Birth/Sex: Treating RN: 04/29/1922 (86 y.o. [name] Primary Care Idrees Quam: Hollace Kinnier Other Clinician: Referring Octavian Godek: Treating Eithan Beagle/Extender: Frederick Peers in Treatment: 17 Active Problems Location of Pain Severity and Description of Pain Patient Has Paino No Site Locations Pain Management and Medication Current Pain Management: Electronic Signature(s) Signed: 02/18/2020 4:00:56 PM By: Sandre Kitty Signed: 02/18/2020 4:12:59 PM By: Levan Hurst RN, BSN Entered By: Sandre Kitty on 02/14/2020 13:36:04 -------------------------------------------------------------------------------- Patient/Caregiver Education Details Patient Name: Date of Service: [facility]  7/15/2021andnbsp1:15 PM Medical Record Number: [id number] Patient Account Number: [id number] Date of Birth/Gender: Treating RN: 1921-08-08 (86 y.o. [name] Primary Care Physician: Hollace Kinnier Other Clinician: Referring Physician: Treating Physician/Extender: Frederick Peers in Treatment: 25 Education Assessment Education Provided To: Patient Education Topics Provided Wound/Skin Impairment: Methods: Explain/Verbal Responses: State content correctly Electronic Signature(s) Signed: 02/18/2020 4:12:59 PM By: Levan Hurst RN, BSN Entered By: Levan Hurst on 02/14/2020 17:36:29 -------------------------------------------------------------------------------- Wound Assessment Details Patient Name: Date of Service: [name], [name] 02/14/2020 1:15 PM Medical Record Number: [id number] Patient Account Number: [id number] Date of Birth/Sex: Treating RN: 1922-04-12 (86 y.o. [name] Primary Care Erika Hussar: Hollace Kinnier Other Clinician: Referring Yue Glasheen: Treating Tynisa Vohs/Extender: Frederick Peers in Treatment: 17 Wound Status Wound Number: 1 Primary Pressure Ulcer Etiology: Wound Location: Sacrum Wound Open Wounding Event: Pressure Injury Status: Date Acquired: 09/03/2019 Comorbid Glaucoma, Chronic Obstructive Pulmonary Disease (COPD), Weeks Of Treatment: 17 History: Coronary Artery Disease, Hypertension, Type II Diabetes, Clustered Wound: No Osteoarthritis, Dementia Photos Photo Uploaded By: Mikeal Hawthorne on 02/14/2020 16:40:46 Wound Measurements Length: (cm) 3.8 Width: (cm) 2.1 Depth: (cm) 2.1 Area: (cm) 6.267 Volume: (cm) 13.162 % Reduction in Area: 28.1% % Reduction in Volume: 11.2% Epithelialization: Small (1-33%) Tunneling: No  Undermining: Yes Starting Position (o'clock): 12 Ending Position (o'clock): 5 Maximum Distance: (cm) 3 Wound Description Classification: Category/Stage IV Wound Margin: Well  defined, not attached Exudate Amount: Medium Exudate Type: Serosanguineous Exudate Color: red, brown Foul Odor After Cleansing: No Slough/Fibrino Yes Wound Bed Granulation Amount: Medium (34-66%) Exposed Structure Granulation Quality: Pink, Pale, Friable Fascia Exposed: No Necrotic Amount: Medium (34-66%) Fat Layer (Subcutaneous Tissue) Exposed: Yes Necrotic Quality: Adherent Slough Tendon Exposed: No Muscle Exposed: Yes Necrosis of Muscle: No Joint Exposed: No Bone Exposed: No Treatment Notes Wound #1 (Sacrum) 3. Primary Dressing Applied [employer] Other primary dressing (specifiy in notes) 4. Secondary Dressing Foam Border Dressing Notes saline moistened gauze packing Electronic Signature(s) Signed: 02/14/2020 5:47:12 PM By: Baruch Gouty RN, BSN Signed: 02/18/2020 4:12:59 PM By: Levan Hurst RN, BSN Entered By: Baruch Gouty on 02/14/2020 13:46:07 -------------------------------------------------------------------------------- [place] Details Patient Name: Date of Service: [facility] [name], [name] 02/14/2020 1:15 PM Medical Record Number: [id number] Patient Account Number: [id number] Date of Birth/Sex: Treating RN: May 27, 1922 (86 y.o. [name] Primary Care Malley Hauter: Hollace Kinnier Other Clinician: Referring Saprina Chuong: Treating Shyra Emile/Extender: Frederick Peers in Treatment: 17 Vital Signs Time Taken: 13:35 Temperature (F): 98.0 Height (in): 71 Pulse (bpm): 78 Weight (lbs): 175 Respiratory Rate (breaths/min): 20 Body Mass Index (BMI): 24.4 Blood Pressure (mmHg): 144/58 Reference Range: 80 - 120 mg / dl Electronic Signature(s) Signed: 02/18/2020 4:00:56 PM By: Sandre Kitty Entered By: Sandre Kitty on 02/14/2020 13:35:59

## 2020-02-20 DIAGNOSIS — N183 Chronic kidney disease, stage 3 unspecified: Secondary | ICD-10-CM | POA: Diagnosis not present

## 2020-02-20 DIAGNOSIS — I2699 Other pulmonary embolism without acute cor pulmonale: Secondary | ICD-10-CM | POA: Diagnosis not present

## 2020-02-20 DIAGNOSIS — F0391 Unspecified dementia with behavioral disturbance: Secondary | ICD-10-CM | POA: Diagnosis not present

## 2020-02-20 DIAGNOSIS — L89154 Pressure ulcer of sacral region, stage 4: Secondary | ICD-10-CM | POA: Diagnosis not present

## 2020-02-20 DIAGNOSIS — I130 Hypertensive heart and chronic kidney disease with heart failure and stage 1 through stage 4 chronic kidney disease, or unspecified chronic kidney disease: Secondary | ICD-10-CM | POA: Diagnosis not present

## 2020-02-20 DIAGNOSIS — E1122 Type 2 diabetes mellitus with diabetic chronic kidney disease: Secondary | ICD-10-CM | POA: Diagnosis not present

## 2020-02-20 DIAGNOSIS — L89302 Pressure ulcer of unspecified buttock, stage 2: Secondary | ICD-10-CM | POA: Diagnosis not present

## 2020-02-20 DIAGNOSIS — M6281 Muscle weakness (generalized): Secondary | ICD-10-CM | POA: Diagnosis not present

## 2020-02-20 DIAGNOSIS — I5032 Chronic diastolic (congestive) heart failure: Secondary | ICD-10-CM | POA: Diagnosis not present

## 2020-02-20 DIAGNOSIS — L89626 Pressure-induced deep tissue damage of left heel: Secondary | ICD-10-CM | POA: Diagnosis not present

## 2020-02-22 DIAGNOSIS — M6281 Muscle weakness (generalized): Secondary | ICD-10-CM | POA: Diagnosis not present

## 2020-02-22 DIAGNOSIS — L89154 Pressure ulcer of sacral region, stage 4: Secondary | ICD-10-CM | POA: Diagnosis not present

## 2020-02-22 DIAGNOSIS — I5032 Chronic diastolic (congestive) heart failure: Secondary | ICD-10-CM | POA: Diagnosis not present

## 2020-02-22 DIAGNOSIS — L89626 Pressure-induced deep tissue damage of left heel: Secondary | ICD-10-CM | POA: Diagnosis not present

## 2020-02-22 DIAGNOSIS — E1122 Type 2 diabetes mellitus with diabetic chronic kidney disease: Secondary | ICD-10-CM | POA: Diagnosis not present

## 2020-02-22 DIAGNOSIS — I2699 Other pulmonary embolism without acute cor pulmonale: Secondary | ICD-10-CM | POA: Diagnosis not present

## 2020-02-22 DIAGNOSIS — I130 Hypertensive heart and chronic kidney disease with heart failure and stage 1 through stage 4 chronic kidney disease, or unspecified chronic kidney disease: Secondary | ICD-10-CM | POA: Diagnosis not present

## 2020-02-22 DIAGNOSIS — N183 Chronic kidney disease, stage 3 unspecified: Secondary | ICD-10-CM | POA: Diagnosis not present

## 2020-02-22 DIAGNOSIS — F0391 Unspecified dementia with behavioral disturbance: Secondary | ICD-10-CM | POA: Diagnosis not present

## 2020-02-25 ENCOUNTER — Encounter (HOSPITAL_BASED_OUTPATIENT_CLINIC_OR_DEPARTMENT_OTHER): Payer: Medicare HMO | Admitting: Internal Medicine

## 2020-02-25 DIAGNOSIS — L89626 Pressure-induced deep tissue damage of left heel: Secondary | ICD-10-CM | POA: Diagnosis not present

## 2020-02-25 DIAGNOSIS — L89154 Pressure ulcer of sacral region, stage 4: Secondary | ICD-10-CM | POA: Diagnosis not present

## 2020-02-25 DIAGNOSIS — N183 Chronic kidney disease, stage 3 unspecified: Secondary | ICD-10-CM | POA: Diagnosis not present

## 2020-02-25 DIAGNOSIS — I2699 Other pulmonary embolism without acute cor pulmonale: Secondary | ICD-10-CM | POA: Diagnosis not present

## 2020-02-25 DIAGNOSIS — E1122 Type 2 diabetes mellitus with diabetic chronic kidney disease: Secondary | ICD-10-CM | POA: Diagnosis not present

## 2020-02-25 DIAGNOSIS — F0391 Unspecified dementia with behavioral disturbance: Secondary | ICD-10-CM | POA: Diagnosis not present

## 2020-02-25 DIAGNOSIS — M6281 Muscle weakness (generalized): Secondary | ICD-10-CM | POA: Diagnosis not present

## 2020-02-25 DIAGNOSIS — I130 Hypertensive heart and chronic kidney disease with heart failure and stage 1 through stage 4 chronic kidney disease, or unspecified chronic kidney disease: Secondary | ICD-10-CM | POA: Diagnosis not present

## 2020-02-25 DIAGNOSIS — I5032 Chronic diastolic (congestive) heart failure: Secondary | ICD-10-CM | POA: Diagnosis not present

## 2020-02-26 DIAGNOSIS — L89302 Pressure ulcer of unspecified buttock, stage 2: Secondary | ICD-10-CM | POA: Diagnosis not present

## 2020-02-27 DIAGNOSIS — I5032 Chronic diastolic (congestive) heart failure: Secondary | ICD-10-CM | POA: Diagnosis not present

## 2020-02-27 DIAGNOSIS — L89154 Pressure ulcer of sacral region, stage 4: Secondary | ICD-10-CM | POA: Diagnosis not present

## 2020-02-27 DIAGNOSIS — N183 Chronic kidney disease, stage 3 unspecified: Secondary | ICD-10-CM | POA: Diagnosis not present

## 2020-02-27 DIAGNOSIS — F0391 Unspecified dementia with behavioral disturbance: Secondary | ICD-10-CM | POA: Diagnosis not present

## 2020-02-27 DIAGNOSIS — I2699 Other pulmonary embolism without acute cor pulmonale: Secondary | ICD-10-CM | POA: Diagnosis not present

## 2020-02-27 DIAGNOSIS — M6281 Muscle weakness (generalized): Secondary | ICD-10-CM | POA: Diagnosis not present

## 2020-02-27 DIAGNOSIS — I130 Hypertensive heart and chronic kidney disease with heart failure and stage 1 through stage 4 chronic kidney disease, or unspecified chronic kidney disease: Secondary | ICD-10-CM | POA: Diagnosis not present

## 2020-02-27 DIAGNOSIS — E1122 Type 2 diabetes mellitus with diabetic chronic kidney disease: Secondary | ICD-10-CM | POA: Diagnosis not present

## 2020-02-27 DIAGNOSIS — L89626 Pressure-induced deep tissue damage of left heel: Secondary | ICD-10-CM | POA: Diagnosis not present

## 2020-02-29 DIAGNOSIS — I130 Hypertensive heart and chronic kidney disease with heart failure and stage 1 through stage 4 chronic kidney disease, or unspecified chronic kidney disease: Secondary | ICD-10-CM | POA: Diagnosis not present

## 2020-02-29 DIAGNOSIS — F0391 Unspecified dementia with behavioral disturbance: Secondary | ICD-10-CM | POA: Diagnosis not present

## 2020-02-29 DIAGNOSIS — L89154 Pressure ulcer of sacral region, stage 4: Secondary | ICD-10-CM | POA: Diagnosis not present

## 2020-02-29 DIAGNOSIS — N183 Chronic kidney disease, stage 3 unspecified: Secondary | ICD-10-CM | POA: Diagnosis not present

## 2020-02-29 DIAGNOSIS — M6281 Muscle weakness (generalized): Secondary | ICD-10-CM | POA: Diagnosis not present

## 2020-02-29 DIAGNOSIS — L89626 Pressure-induced deep tissue damage of left heel: Secondary | ICD-10-CM | POA: Diagnosis not present

## 2020-02-29 DIAGNOSIS — I5032 Chronic diastolic (congestive) heart failure: Secondary | ICD-10-CM | POA: Diagnosis not present

## 2020-02-29 DIAGNOSIS — E1122 Type 2 diabetes mellitus with diabetic chronic kidney disease: Secondary | ICD-10-CM | POA: Diagnosis not present

## 2020-02-29 DIAGNOSIS — I2699 Other pulmonary embolism without acute cor pulmonale: Secondary | ICD-10-CM | POA: Diagnosis not present

## 2020-03-03 DIAGNOSIS — E1122 Type 2 diabetes mellitus with diabetic chronic kidney disease: Secondary | ICD-10-CM | POA: Diagnosis not present

## 2020-03-03 DIAGNOSIS — F0391 Unspecified dementia with behavioral disturbance: Secondary | ICD-10-CM | POA: Diagnosis not present

## 2020-03-03 DIAGNOSIS — N183 Chronic kidney disease, stage 3 unspecified: Secondary | ICD-10-CM | POA: Diagnosis not present

## 2020-03-03 DIAGNOSIS — I2699 Other pulmonary embolism without acute cor pulmonale: Secondary | ICD-10-CM | POA: Diagnosis not present

## 2020-03-03 DIAGNOSIS — L89154 Pressure ulcer of sacral region, stage 4: Secondary | ICD-10-CM | POA: Diagnosis not present

## 2020-03-03 DIAGNOSIS — I5032 Chronic diastolic (congestive) heart failure: Secondary | ICD-10-CM | POA: Diagnosis not present

## 2020-03-03 DIAGNOSIS — L89626 Pressure-induced deep tissue damage of left heel: Secondary | ICD-10-CM | POA: Diagnosis not present

## 2020-03-03 DIAGNOSIS — I130 Hypertensive heart and chronic kidney disease with heart failure and stage 1 through stage 4 chronic kidney disease, or unspecified chronic kidney disease: Secondary | ICD-10-CM | POA: Diagnosis not present

## 2020-03-03 DIAGNOSIS — I69351 Hemiplegia and hemiparesis following cerebral infarction affecting right dominant side: Secondary | ICD-10-CM | POA: Diagnosis not present

## 2020-03-04 DIAGNOSIS — L89153 Pressure ulcer of sacral region, stage 3: Secondary | ICD-10-CM | POA: Diagnosis not present

## 2020-03-04 DIAGNOSIS — R2689 Other abnormalities of gait and mobility: Secondary | ICD-10-CM | POA: Diagnosis not present

## 2020-03-05 DIAGNOSIS — I2699 Other pulmonary embolism without acute cor pulmonale: Secondary | ICD-10-CM | POA: Diagnosis not present

## 2020-03-05 DIAGNOSIS — I130 Hypertensive heart and chronic kidney disease with heart failure and stage 1 through stage 4 chronic kidney disease, or unspecified chronic kidney disease: Secondary | ICD-10-CM | POA: Diagnosis not present

## 2020-03-05 DIAGNOSIS — L89154 Pressure ulcer of sacral region, stage 4: Secondary | ICD-10-CM | POA: Diagnosis not present

## 2020-03-05 DIAGNOSIS — F0391 Unspecified dementia with behavioral disturbance: Secondary | ICD-10-CM | POA: Diagnosis not present

## 2020-03-05 DIAGNOSIS — L89626 Pressure-induced deep tissue damage of left heel: Secondary | ICD-10-CM | POA: Diagnosis not present

## 2020-03-05 DIAGNOSIS — I5032 Chronic diastolic (congestive) heart failure: Secondary | ICD-10-CM | POA: Diagnosis not present

## 2020-03-05 DIAGNOSIS — N183 Chronic kidney disease, stage 3 unspecified: Secondary | ICD-10-CM | POA: Diagnosis not present

## 2020-03-05 DIAGNOSIS — E1122 Type 2 diabetes mellitus with diabetic chronic kidney disease: Secondary | ICD-10-CM | POA: Diagnosis not present

## 2020-03-05 DIAGNOSIS — I69351 Hemiplegia and hemiparesis following cerebral infarction affecting right dominant side: Secondary | ICD-10-CM | POA: Diagnosis not present

## 2020-03-07 DIAGNOSIS — N183 Chronic kidney disease, stage 3 unspecified: Secondary | ICD-10-CM | POA: Diagnosis not present

## 2020-03-07 DIAGNOSIS — L89626 Pressure-induced deep tissue damage of left heel: Secondary | ICD-10-CM | POA: Diagnosis not present

## 2020-03-07 DIAGNOSIS — I69351 Hemiplegia and hemiparesis following cerebral infarction affecting right dominant side: Secondary | ICD-10-CM | POA: Diagnosis not present

## 2020-03-07 DIAGNOSIS — F0391 Unspecified dementia with behavioral disturbance: Secondary | ICD-10-CM | POA: Diagnosis not present

## 2020-03-07 DIAGNOSIS — I2699 Other pulmonary embolism without acute cor pulmonale: Secondary | ICD-10-CM | POA: Diagnosis not present

## 2020-03-07 DIAGNOSIS — L89154 Pressure ulcer of sacral region, stage 4: Secondary | ICD-10-CM | POA: Diagnosis not present

## 2020-03-07 DIAGNOSIS — E1122 Type 2 diabetes mellitus with diabetic chronic kidney disease: Secondary | ICD-10-CM | POA: Diagnosis not present

## 2020-03-07 DIAGNOSIS — I130 Hypertensive heart and chronic kidney disease with heart failure and stage 1 through stage 4 chronic kidney disease, or unspecified chronic kidney disease: Secondary | ICD-10-CM | POA: Diagnosis not present

## 2020-03-07 DIAGNOSIS — I5032 Chronic diastolic (congestive) heart failure: Secondary | ICD-10-CM | POA: Diagnosis not present

## 2020-03-09 DIAGNOSIS — U071 COVID-19: Secondary | ICD-10-CM | POA: Diagnosis not present

## 2020-03-10 DIAGNOSIS — L89626 Pressure-induced deep tissue damage of left heel: Secondary | ICD-10-CM | POA: Diagnosis not present

## 2020-03-10 DIAGNOSIS — L89154 Pressure ulcer of sacral region, stage 4: Secondary | ICD-10-CM | POA: Diagnosis not present

## 2020-03-10 DIAGNOSIS — I69351 Hemiplegia and hemiparesis following cerebral infarction affecting right dominant side: Secondary | ICD-10-CM | POA: Diagnosis not present

## 2020-03-10 DIAGNOSIS — E1122 Type 2 diabetes mellitus with diabetic chronic kidney disease: Secondary | ICD-10-CM | POA: Diagnosis not present

## 2020-03-10 DIAGNOSIS — N183 Chronic kidney disease, stage 3 unspecified: Secondary | ICD-10-CM | POA: Diagnosis not present

## 2020-03-10 DIAGNOSIS — F0391 Unspecified dementia with behavioral disturbance: Secondary | ICD-10-CM | POA: Diagnosis not present

## 2020-03-10 DIAGNOSIS — I5032 Chronic diastolic (congestive) heart failure: Secondary | ICD-10-CM | POA: Diagnosis not present

## 2020-03-10 DIAGNOSIS — I2699 Other pulmonary embolism without acute cor pulmonale: Secondary | ICD-10-CM | POA: Diagnosis not present

## 2020-03-10 DIAGNOSIS — I130 Hypertensive heart and chronic kidney disease with heart failure and stage 1 through stage 4 chronic kidney disease, or unspecified chronic kidney disease: Secondary | ICD-10-CM | POA: Diagnosis not present

## 2020-03-12 DIAGNOSIS — E1122 Type 2 diabetes mellitus with diabetic chronic kidney disease: Secondary | ICD-10-CM | POA: Diagnosis not present

## 2020-03-12 DIAGNOSIS — I130 Hypertensive heart and chronic kidney disease with heart failure and stage 1 through stage 4 chronic kidney disease, or unspecified chronic kidney disease: Secondary | ICD-10-CM | POA: Diagnosis not present

## 2020-03-12 DIAGNOSIS — F0391 Unspecified dementia with behavioral disturbance: Secondary | ICD-10-CM | POA: Diagnosis not present

## 2020-03-12 DIAGNOSIS — I2699 Other pulmonary embolism without acute cor pulmonale: Secondary | ICD-10-CM | POA: Diagnosis not present

## 2020-03-12 DIAGNOSIS — I5032 Chronic diastolic (congestive) heart failure: Secondary | ICD-10-CM | POA: Diagnosis not present

## 2020-03-12 DIAGNOSIS — L89626 Pressure-induced deep tissue damage of left heel: Secondary | ICD-10-CM | POA: Diagnosis not present

## 2020-03-12 DIAGNOSIS — I69351 Hemiplegia and hemiparesis following cerebral infarction affecting right dominant side: Secondary | ICD-10-CM | POA: Diagnosis not present

## 2020-03-12 DIAGNOSIS — N183 Chronic kidney disease, stage 3 unspecified: Secondary | ICD-10-CM | POA: Diagnosis not present

## 2020-03-12 DIAGNOSIS — L89154 Pressure ulcer of sacral region, stage 4: Secondary | ICD-10-CM | POA: Diagnosis not present

## 2020-03-13 ENCOUNTER — Encounter (HOSPITAL_BASED_OUTPATIENT_CLINIC_OR_DEPARTMENT_OTHER): Payer: Medicare HMO | Attending: Internal Medicine | Admitting: Internal Medicine

## 2020-03-13 DIAGNOSIS — F0150 Vascular dementia without behavioral disturbance: Secondary | ICD-10-CM | POA: Diagnosis not present

## 2020-03-13 DIAGNOSIS — Z8673 Personal history of transient ischemic attack (TIA), and cerebral infarction without residual deficits: Secondary | ICD-10-CM | POA: Diagnosis not present

## 2020-03-13 DIAGNOSIS — L03031 Cellulitis of right toe: Secondary | ICD-10-CM | POA: Insufficient documentation

## 2020-03-13 DIAGNOSIS — I10 Essential (primary) hypertension: Secondary | ICD-10-CM | POA: Diagnosis not present

## 2020-03-13 DIAGNOSIS — L97522 Non-pressure chronic ulcer of other part of left foot with fat layer exposed: Secondary | ICD-10-CM | POA: Diagnosis not present

## 2020-03-13 DIAGNOSIS — L97518 Non-pressure chronic ulcer of other part of right foot with other specified severity: Secondary | ICD-10-CM | POA: Insufficient documentation

## 2020-03-13 DIAGNOSIS — E11622 Type 2 diabetes mellitus with other skin ulcer: Secondary | ICD-10-CM | POA: Insufficient documentation

## 2020-03-13 DIAGNOSIS — H409 Unspecified glaucoma: Secondary | ICD-10-CM | POA: Insufficient documentation

## 2020-03-13 DIAGNOSIS — Z6824 Body mass index (BMI) 24.0-24.9, adult: Secondary | ICD-10-CM | POA: Diagnosis not present

## 2020-03-13 DIAGNOSIS — M1990 Unspecified osteoarthritis, unspecified site: Secondary | ICD-10-CM | POA: Insufficient documentation

## 2020-03-13 DIAGNOSIS — I2510 Atherosclerotic heart disease of native coronary artery without angina pectoris: Secondary | ICD-10-CM | POA: Insufficient documentation

## 2020-03-13 DIAGNOSIS — J449 Chronic obstructive pulmonary disease, unspecified: Secondary | ICD-10-CM | POA: Insufficient documentation

## 2020-03-13 DIAGNOSIS — L89154 Pressure ulcer of sacral region, stage 4: Secondary | ICD-10-CM | POA: Diagnosis not present

## 2020-03-13 DIAGNOSIS — E11621 Type 2 diabetes mellitus with foot ulcer: Secondary | ICD-10-CM | POA: Diagnosis not present

## 2020-03-14 DIAGNOSIS — N183 Chronic kidney disease, stage 3 unspecified: Secondary | ICD-10-CM | POA: Diagnosis not present

## 2020-03-14 DIAGNOSIS — I2699 Other pulmonary embolism without acute cor pulmonale: Secondary | ICD-10-CM | POA: Diagnosis not present

## 2020-03-14 DIAGNOSIS — F0391 Unspecified dementia with behavioral disturbance: Secondary | ICD-10-CM | POA: Diagnosis not present

## 2020-03-14 DIAGNOSIS — L89154 Pressure ulcer of sacral region, stage 4: Secondary | ICD-10-CM | POA: Diagnosis not present

## 2020-03-14 DIAGNOSIS — I5032 Chronic diastolic (congestive) heart failure: Secondary | ICD-10-CM | POA: Diagnosis not present

## 2020-03-14 DIAGNOSIS — E1122 Type 2 diabetes mellitus with diabetic chronic kidney disease: Secondary | ICD-10-CM | POA: Diagnosis not present

## 2020-03-14 DIAGNOSIS — I130 Hypertensive heart and chronic kidney disease with heart failure and stage 1 through stage 4 chronic kidney disease, or unspecified chronic kidney disease: Secondary | ICD-10-CM | POA: Diagnosis not present

## 2020-03-14 DIAGNOSIS — I69351 Hemiplegia and hemiparesis following cerebral infarction affecting right dominant side: Secondary | ICD-10-CM | POA: Diagnosis not present

## 2020-03-14 DIAGNOSIS — L89626 Pressure-induced deep tissue damage of left heel: Secondary | ICD-10-CM | POA: Diagnosis not present

## 2020-03-14 NOTE — Progress Notes (Signed)
[name], [name] ([id number]) Visit Report for 03/13/2020 Arrival Information Details Patient Name: Date of Service: [name], [name] 03/13/2020 1:15 PM Medical Record Number: [id number] Patient Account Number: [id number] Date of Birth/Sex: Treating RN: [birth year] (86 y.o. [name]) Carlene Coria Primary Care Loudon Krakow: Hollace Kinnier Other Clinician: Referring Margareth Kanner: Treating Anilah Huck/Extender: Katrinka Blazing in Treatment: 21 Visit Information History Since Last Visit All ordered tests and consults were completed: No Patient Arrived: Wheel Chair Added or deleted any medications: No Arrival Time: 13:21 Any new allergies or adverse reactions: No Accompanied By: daughter Had a fall or experienced change in No Transfer Assistance: [employer] fast streamer activities of daily living that may affect Patient Identification Verified: Yes risk of falls: Secondary Verification Process Completed: Yes Signs or symptoms of abuse/neglect since last visito No Patient Requires Transmission-Based Precautions: No Hospitalized since last visit: No Patient Has Alerts: No Implantable device outside of the clinic excluding No cellular tissue based products placed in the center since last visit: Has Dressing in Place as Prescribed: Yes Pain Present Now: No Electronic Signature(s) Signed: 03/13/2020 5:07:35 PM By: Carlene Coria RN Entered By: Carlene Coria on 03/13/2020 13:22:04 -------------------------------------------------------------------------------- Clinic Level of Care Assessment Details Patient Name: Date of Service: [name], [name] 03/13/2020 1:15 PM Medical Record Number: [id number] Patient Account Number: [id number] Date of Birth/Sex: Treating RN: [birth year] (86 y.o. [name] Primary Care Kathye Cipriani: Hollace Kinnier Other Clinician: Referring Leonard Feigel: Treating Jake Fuhrmann/Extender: Katrinka Blazing in Treatment: 21 Clinic Level of Care Assessment Items TOOL 4  Quantity Score X- 1 0 Use when only an EandM is performed on FOLLOW-UP visit ASSESSMENTS - Nursing Assessment / Reassessment X- 1 10 Reassessment of Co-morbidities (includes updates in patient status) X- 1 5 Reassessment of Adherence to Treatment Plan ASSESSMENTS - Wound and Skin A ssessment / Reassessment []  - 0 Simple Wound Assessment / Reassessment - one wound X- 4 5 Complex Wound Assessment / Reassessment - multiple wounds []  - 0 Dermatologic / Skin Assessment (not related to wound area) ASSESSMENTS - Focused Assessment []  - 0 Circumferential Edema Measurements - multi extremities []  - 0 Nutritional Assessment / Counseling / Intervention X- 1 5 Lower Extremity Assessment (monofilament, tuning fork, pulses) []  - 0 Peripheral Arterial Disease Assessment (using hand held doppler) ASSESSMENTS - Ostomy and/or Continence Assessment and Care []  - 0 Incontinence Assessment and Management []  - 0 Ostomy Care Assessment and Management (repouching, etc.) PROCESS - Coordination of Care X - Simple Patient / Family Education for ongoing care 1 15 []  - 0 Complex (extensive) Patient / Family Education for ongoing care X- 1 10 Staff obtains Consents, Records, T Results / Process Orders est X- 1 10 Staff telephones HHA, Nursing Homes / Clarify orders / etc []  - 0 Routine Transfer to another Facility (non-emergent condition) []  - 0 Routine Hospital Admission (non-emergent condition) []  - 0 New Admissions / Biomedical engineer / Ordering NPWT Apligraf, etc. , []  - 0 Emergency Hospital Admission (emergent condition) X- 1 10 Simple Discharge Coordination []  - 0 Complex (extensive) Discharge Coordination PROCESS - Special Needs []  - 0 Pediatric / Minor Patient Management []  - 0 Isolation Patient Management []  - 0 Hearing / Language / Visual special needs []  - 0 Assessment of Community assistance (transportation, D/C planning, etc.) []  - 0 Additional assistance / Altered  mentation []  - 0 Support Surface(s) Assessment (bed, cushion, seat, etc.) INTERVENTIONS - Wound Cleansing / Measurement []  - 0 Simple Wound Cleansing - one wound X- 4 5  Complex Wound Cleansing - multiple wounds X- 1 5 Wound Imaging (photographs - any number of wounds) []  - 0 Wound Tracing (instead of photographs) []  - 0 Simple Wound Measurement - one wound X- 4 5 Complex Wound Measurement - multiple wounds INTERVENTIONS - Wound Dressings []  - 0 Small Wound Dressing one or multiple wounds X- 2 15 Medium Wound Dressing one or multiple wounds []  - 0 Large Wound Dressing one or multiple wounds []  - 0 Application of Medications - topical []  - 0 Application of Medications - injection INTERVENTIONS - Miscellaneous []  - 0 External ear exam []  - 0 Specimen Collection (cultures, biopsies, blood, body fluids, etc.) []  - 0 Specimen(s) / Culture(s) sent or taken to Lab for analysis X- 1 10 Patient Transfer (multiple staff / Civil Service fast streamer / Similar devices) []  - 0 Simple Staple / Suture removal (25 or less) []  - 0 Complex Staple / Suture removal (26 or more) []  - 0 Hypo / Hyperglycemic Management (close monitor of Blood Glucose) []  - 0 Ankle / Brachial Index (ABI) - do not check if billed separately X- 1 5 Vital Signs Has the patient been seen at the hospital within the last three years: Yes Total Score: 175 Level Of Care: New/Established - Level 5 Electronic Signature(s) Signed: 03/14/2020 5:19:54 PM By: Levan Hurst RN, BSN Entered By: Levan Hurst on 03/13/2020 14:13:48 -------------------------------------------------------------------------------- [place] Details Patient Name: Date of Service: [facility] [name], [name] 03/13/2020 1:15 PM Medical Record Number: [id number] Patient Account Number: [id number] Date of Birth/Sex: Treating RN: 01-Apr-1922 (86 y.o. [name] Primary Care Moishy Laday: Hollace Kinnier Other Clinician: Referring Lania Zawistowski: Treating  Bexlee Bergdoll/Extender: Katrinka Blazing in Treatment: 21 Active Inactive Wound/Skin Impairment Nursing Diagnoses: Impaired tissue integrity Knowledge deficit related to ulceration/compromised skin integrity Goals: Patient/caregiver will verbalize understanding of skin care regimen Date Initiated: 10/17/2019 Target Resolution Date: 04/11/2020 Goal Status: Active Ulcer/skin breakdown will have a volume reduction of 30% by week 4 Date Initiated: 10/17/2019 Date Inactivated: 12/28/2019 Target Resolution Date: 12/28/2019 Goal Status: Unmet Unmet Reason: chronic wound Interventions: Assess patient/caregiver ability to obtain necessary supplies Assess patient/caregiver ability to perform ulcer/skin care regimen upon admission and as needed Assess ulceration(s) every visit Provide education on ulcer and skin care Treatment Activities: Skin care regimen initiated : 10/17/2019 Topical wound management initiated : 10/17/2019 Notes: Electronic Signature(s) Signed: 03/14/2020 5:19:54 PM By: Levan Hurst RN, BSN Entered By: Levan Hurst on 03/13/2020 14:10:58 -------------------------------------------------------------------------------- Pain Assessment Details Patient Name: Date of Service: [name], [name] 03/13/2020 1:15 PM Medical Record Number: [id number] Patient Account Number: [id number] Date of Birth/Sex: Treating RN: 10-29-1921 (86 y.o. [name]) Carlene Coria Primary Care Sumaya Riedesel: Other Clinician: Hollace Kinnier Referring Gage Weant: Treating Adiel Mcnamara/Extender: Katrinka Blazing in Treatment: 21 Active Problems Location of Pain Severity and Description of Pain Patient Has Paino No Site Locations Pain Management and Medication Current Pain Management: Electronic Signature(s) Signed: 03/13/2020 5:07:35 PM By: Carlene Coria RN Entered By: Carlene Coria on 03/13/2020  13:22:45 -------------------------------------------------------------------------------- Patient/Caregiver Education Details Patient Name: Date of Service: Jeb Levering 8/12/2021andnbsp1:15 PM Medical Record Number: [id number] Patient Account Number: [id number] Date of Birth/Gender: Treating RN: 1921-10-17 (86 y.o. [name] Primary Care Physician: Hollace Kinnier Other Clinician: Referring Physician: Treating Physician/Extender: Katrinka Blazing in Treatment: 21 Education Assessment Education Provided To: Patient Education Topics Provided Wound/Skin Impairment: Methods: Explain/Verbal Responses: State content correctly [employer]) Signed: 03/14/2020 5:19:54 PM By: Levan Hurst RN, BSN Entered By: Levan Hurst  on 03/13/2020 14:12:49 -------------------------------------------------------------------------------- Wound Assessment Details Patient Name: Date of Service: [name], [name] 03/13/2020 1:15 PM Medical Record Number: [id number] Patient Account Number: [id number] Date of Birth/Sex: Treating RN: [birth year] (86 y.o. [name] Primary Care Stesha Neyens: Hollace Kinnier Other Clinician: Referring Barbarann Kelly: Treating Salam Chesterfield/Extender: Katrinka Blazing in Treatment: 21 Wound Status Wound Number: 1 Primary Pressure Ulcer Etiology: Wound Location: Sacrum Wound Open Wounding Event: Pressure Injury Status: Date Acquired: 09/03/2019 Comorbid Glaucoma, Chronic Obstructive Pulmonary Disease (COPD), Weeks Of Treatment: 21 History: Coronary Artery Disease, Hypertension, Type II Diabetes, Clustered Wound: No Osteoarthritis, Dementia Wound Measurements Length: (cm) 3.2 Width: (cm) 1.7 Depth: (cm) 1 Area: (cm) 4.273 Volume: (cm) 4.273 % Reduction in Area: 51% % Reduction in Volume: 71.2% Epithelialization: Small (1-33%) Tunneling: No Undermining: No Wound Description Classification: Category/Stage  IV Wound Margin: Well defined, not attached Exudate Amount: Medium Exudate Type: Serosanguineous Exudate Color: red, brown Foul Odor After Cleansing: No Slough/Fibrino Yes Wound Bed Granulation Amount: Medium (34-66%) Exposed Structure Granulation Quality: Pink, Pale, Friable Fascia Exposed: No Necrotic Amount: Medium (34-66%) Fat Layer (Subcutaneous Tissue) Exposed: Yes Necrotic Quality: Adherent Slough Tendon Exposed: No Muscle Exposed: Yes Necrosis of Muscle: No Joint Exposed: No Bone Exposed: Yes Electronic Signature(s) Signed: 03/14/2020 5:19:54 PM By: Levan Hurst RN, BSN Entered By: Levan Hurst on 03/13/2020 14:01:20 -------------------------------------------------------------------------------- Wound Assessment Details Patient Name: Date of Service: [name] 03/13/2020 1:15 PM Medical Record Number: [id number] Patient Account Number: [id number] Date of Birth/Sex: Treating RN: [birth year] (86 y.o. [name]) Carlene Coria Primary Care Deavon Podgorski: Hollace Kinnier Other Clinician: Referring Hatsue Sime: Treating Kohner Orlick/Extender: Sunday Spillers Weeks in Treatment: 21 Wound Status Wound Number: 3 Primary Atypical Etiology: Wound Location: Right T [place] oe Wound Open Wounding Event: Trauma Status: Date Acquired: 03/13/2020 Date Acquired: 03/13/2020 Comorbid Glaucoma, Chronic Obstructive Pulmonary Disease (COPD), Weeks Of Treatment: 0 History: Coronary Artery Disease, Hypertension, Type II Diabetes, Clustered Wound: No Osteoarthritis, Dementia Wound Measurements Length: (cm) 1 Width: (cm) 1.2 Depth: (cm) 0.1 Area: (cm) 0.942 Volume: (cm) 0.094 % Reduction in Area: % Reduction in Volume: Epithelialization: None Tunneling: No Undermining: No Wound Description Classification: Full Thickness Without Exposed Support Structures Exudate Amount: Medium Exudate Type: Sanguinous Exudate Color: red Foul Odor After Cleansing: No Slough/Fibrino  No Wound Bed Granulation Amount: None Present (0%) Exposed Structure Necrotic Amount: None Present (0%) Fascia Exposed: No Fat Layer (Subcutaneous Tissue) Exposed: No Tendon Exposed: No Muscle Exposed: No Joint Exposed: No Bone Exposed: No Electronic Signature(s) Signed: 03/13/2020 5:07:35 PM By: Carlene Coria RN Entered By: Carlene Coria on 03/13/2020 13:24:54 -------------------------------------------------------------------------------- Wound Assessment Details Patient Name: Date of Service: [name], [name] 03/13/2020 1:15 PM Medical Record Number: [id number] Patient Account Number: [id number] Date of Birth/Sex: Treating RN: [birth year] (86 y.o. [name]) Carlene Coria Primary Care Roshonda Sperl: Hollace Kinnier Other Clinician: Referring Dailah Opperman: Treating Tyresa Prindiville/Extender: Sunday Spillers Weeks in Treatment: 21 Wound Status Wound Number: 4 Primary Atypical Etiology: Wound Location: Right T Second oe Wound Open Wounding Event: Trauma Status: Date Acquired: 03/13/2020 Comorbid Glaucoma, Chronic Obstructive Pulmonary Disease (COPD), Weeks Of Treatment: 0 History: Coronary Artery Disease, Hypertension, Type II Diabetes, Clustered Wound: No Osteoarthritis, Dementia Wound Measurements Length: (cm) 3.1 Width: (cm) 1 Depth: (cm) 0.2 Area: (cm) 2.435 Volume: (cm) 0.487 % Reduction in Area: 0% % Reduction in Volume: 0% Epithelialization: None Tunneling: No Undermining: No Wound Description Classification: Full Thickness With Exposed Support Structures Exudate Amount: Medium Exudate Type: Serosanguineous Exudate Color: red, brown Foul Odor After Cleansing:  No Slough/Fibrino No Wound Bed Granulation Amount: None Present (0%) Exposed Structure Necrotic Amount: None Present (0%) Fascia Exposed: No Fat Layer (Subcutaneous Tissue) Exposed: Yes Tendon Exposed: No Muscle Exposed: No Joint Exposed: No Bone Exposed: Yes Electronic Signature(s) Signed: 03/13/2020  5:07:35 PM By: Carlene Coria RN Entered By: Carlene Coria on 03/13/2020 13:30:22 -------------------------------------------------------------------------------- Wound Assessment Details Patient Name: Date of Service: [name], [name] 03/13/2020 1:15 PM Medical Record Number: [id number] Patient Account Number: [id number] Date of Birth/Sex: Treating RN: 06-Nov-1921 (86 y.o. [name]) Carlene Coria Primary Care Suzannah Bettes: Hollace Kinnier Other Clinician: Referring Ericberto Padget: Treating Temple Ewart/Extender: Katrinka Blazing in Treatment: 21 Wound Status Wound Number: 5 Primary Atypical Etiology: Wound Location: Right T Third oe Wound Open Wounding Event: Trauma Status: Date Acquired: 03/13/2020 Comorbid Glaucoma, Chronic Obstructive Pulmonary Disease (COPD), Weeks Of Treatment: 0 History: Coronary Artery Disease, Hypertension, Type II Diabetes, Clustered Wound: No Osteoarthritis, Dementia Wound Measurements Length: (cm) 1 Width: (cm) 0.5 Depth: (cm) 0.1 Area: (cm) 0.393 Volume: (cm) 0.039 % Reduction in Area: % Reduction in Volume: Epithelialization: None Tunneling: No Undermining: No Wound Description Classification: Full Thickness Without Exposed Support Structures Exudate Amount: Small Exudate Type: Serosanguineous Exudate Color: red, brown Foul Odor After Cleansing: No Slough/Fibrino No Wound Bed Granulation Amount: None Present (0%) Exposed Structure Necrotic Amount: None Present (0%) Fascia Exposed: No Fat Layer (Subcutaneous Tissue) Exposed: No Tendon Exposed: No Muscle Exposed: No Joint Exposed: No Bone Exposed: No Electronic Signature(s) Signed: 03/13/2020 5:07:35 PM By: Carlene Coria RN Entered By: Carlene Coria on 03/13/2020 13:30:01 -------------------------------------------------------------------------------- Vitals Details Patient Name: Date of Service: [facility] [name], [name] 03/13/2020 1:15 PM Medical Record Number: [id number] Patient Account Number:  [id number] Date of Birth/Sex: Treating RN: Jun 05, 1922 (86 y.o. [name]) Carlene Coria Primary Care Celica Kotowski: Other Clinician: Hollace Kinnier Referring Anjelique Makar: Treating Bethany Hirt/Extender: Katrinka Blazing in Treatment: 21 Vital Signs Time Taken: 13:22 Temperature (F): 97.7 Height (in): 71 Pulse (bpm): 116 Weight (lbs): 175 Respiratory Rate (breaths/min): 18 Body Mass Index (BMI): 24.4 Blood Pressure (mmHg): 139/73 Reference Range: 80 - 120 mg / dl Electronic Signature(s) Signed: 03/13/2020 5:07:35 PM By: Carlene Coria RN Entered By: Carlene Coria on 03/13/2020 13:22:36

## 2020-03-14 NOTE — Progress Notes (Signed)
[name], [name] ([id number]) Visit Report for 03/13/2020 HPI Details Patient Name: Date of Service: [name], [name] 03/13/2020 1:15 PM Medical Record Number: [id number] Patient Account Number: [id number] Date of Birth/Sex: Treating RN: 01-15-1922 (86 y.o. [name] Primary Care Provider: Hollace Kinnier Other Clinician: Referring Provider: Treating Provider/Extender: Katrinka Blazing in Treatment: 21 History of Present Illness HPI Description: 10/17/2019 upon evaluation today patient presents for initial inspection here in our clinic concerning issues that has been having with a wound in the sacral region as well as the left heel. Fortunately neither appears to be overtly and significantly infected there is some necrotic tissue noted at both locations however that is going to be required to be addressed. He does have a history of diabetes mellitus type 2, vascular dementia, and hypertension. The patient did have a stroke in November 2020 unfortunately he has not been the same since that time according to his daughter. Prior to that he was taking care of himself now he is 100% dependent. He is at home with his daughter who is present during the office visit today as well that is where I get the majority of the history from at this point. Patient does have a history of hypertension as well which does not appear to be too out of control today which is good news. He does have a hemoglobin A1c of 6.05 May 2019. They currently have been using a calcium alginate dressing to the sacrum and it sounds like Skin-Prep and a foam on the heel. 4/9; pressure ulcers on the sacral area and left heel. Been using Anasept wet-to-dry on the sacrum and Hydrofera Blue in the heel. He has home health family is helping with the dressing. It sounds as though they are fairly religious about offloading these areas that have pressure relief surfaces for the bed. Wounds look better 4/30; I have not  seen this patient in 3 weeks. We have been using Anasept wet-to-dry the sacrum and Hydrofera Blue on the heel. When I saw this 3 weeks ago the sacrum actually look quite good and I thought that we might be able to transition him to a collagen-based dressing today. 5/10; the since the patient was last here he was hospitalized from 5/5 through 12/07/2019. He was noted to have a pressure injury of the sacral region stage IV. MRI did not suggest discitis or osteomyelitis. There was nonspecific bilateral paraspinal muscle edema. Culture I did when he was here the last time showed E. coli I believe they are aware of this. Blood cultures were negative. His white count was 10.8 on admission 11.9 at discharge his comprehensive metabolic panel notable for an albumin of 2.4 BUN of 32 with creatinine of 0.03 hemoglobin was only 7.3 with a discharge hemoglobin of 8.1 after dropping to a hemoglobin of 6.6 He is back at home. Discharge antibiotic was Augmentin.[name] He was treated with IV Rocephin in the hospital. The patient was seen by palliative care I have not looked at this consult. They have been using dressings that they had left over at home I think most recently [facility]. According to his daughter the patient is eating fairly well 12/28/19-Patient is back at 2 weeks, the sacral wound is about the same, the right heel has healed and closed. We are using silver alginate to the sacral wound 6/18; we continue to follow the sacral wound. His heel wounds have closed. We have been using silver alginate to the sacrum 7/1; sacral wound. We  have been using silver collagen with backing wet-to-dry. No open wounds on the heels. 7/15; some improvement in the dimensions of the sacral wound including the undermining area we have been using silver collagen with backing wet-to-dry dressings. We had some thoughts about trying a wound VAC. He has home health. 03/13/20-Sacral wound looks intact with healthy base, surrounding skin  intact, no maceration, he did unfortunately develop to abrasion wounds on his first second and third toes dorsum of the Right foot this happened when his foot got trapped below the wheelchair Footguard Electronic Signature(s) Signed: 03/13/2020 2:08:18 PM By: Tobi Bastos MD, MBA Previous Signature: 03/13/2020 2:05:42 PM Version By: Tobi Bastos MD, MBA Entered By: Tobi Bastos on 03/13/2020 14:08:18 -------------------------------------------------------------------------------- Physical Exam Details Patient Name: Date of Service: [name], [name] 03/13/2020 1:15 PM Medical Record Number: [id number] Patient Account Number: [id number] Date of Birth/Sex: Treating RN: [birth year] (86 y.o. [name] Primary Care Provider: Hollace Kinnier Other Clinician: Referring Provider: Treating Provider/Extender: Katrinka Blazing in Treatment: 21 Constitutional alert and oriented x 3. sitting or standing blood pressure is within target range for patient.. supine blood pressure is within target range for patient.. pulse regular and within target range for patient.[name] respirations regular, non-labored and within target range for patient.[name] temperature within target range for patient.. . . Well- nourished and well-hydrated in no acute distress. Notes Right first second and third toes dorsal abrasions noted nailbed and nail intact Sacral wound appears stable with granulation at base no necrotic areas Electronic Signature(s) Signed: 03/13/2020 2:07:46 PM By: Tobi Bastos MD, MBA Previous Signature: 03/13/2020 2:06:25 PM Version By: Tobi Bastos MD, MBA Entered By: Tobi Bastos on 03/13/2020 14:07:45 -------------------------------------------------------------------------------- Physician Orders Details Patient Name: Date of Service: [name], [name] 03/13/2020 1:15 PM Medical Record Number: [id number] Patient Account Number: [id number] Date of Birth/Sex: Treating  RN: August 26, 1921 (86 y.o. [name] Primary Care Provider: Hollace Kinnier Other Clinician: Referring Provider: Treating Provider/Extender: Katrinka Blazing in Treatment: 21 Verbal / Phone Orders: No Diagnosis Coding ICD-10 Coding Code Description L89.154 Pressure ulcer of sacral region, stage 4 E11.621 Type 2 diabetes mellitus with foot ulcer L97.522 Non-pressure chronic ulcer of other part of left foot with fat layer exposed F01.50 Vascular dementia without behavioral disturbance I10 Essential (primary) hypertension Follow-up Appointments ppointment in 2 weeks. - ***[facility] - ROOM 5*** Return A Dressing Change Frequency Change dressing three times week. - all wounds Skin Barriers/Peri-Wound Care Wound #1 Sacrum Skin Prep - to periwound Wound Cleansing Clean wound with Wound Cleanser - or normal saline Primary Wound Dressing Wound #1 Sacrum Other: - wet-to-dry in today in clinic, home health to apply wound vac Wound #3 Right T Great oe Calcium Alginate with Silver Wound #4 Right T Second oe Calcium Alginate with Silver Wound #5 Right T Third oe Calcium Alginate with Silver Secondary Dressing Wound #1 Sacrum Foam Border Wound #3 Right T Great oe Kerlix/Rolled Gauze Dry Gauze Wound #4 Right T Second oe Kerlix/Rolled Gauze Dry Gauze Wound #5 Right T Third oe Kerlix/Rolled Gauze Dry Gauze Negative Presssure Wound Therapy Wound #1 Sacrum Medela Wound Vac continuously at 171mm/hg Black Foam Off-Loading Low air-loss mattress (Group 2) Turn and reposition every 2 hours Other: - float heels with pillows under calves while in bed [name] for wound care. - [facility] Electronic Signature(s) Signed: 03/13/2020 5:03:09 PM By: Tobi Bastos MD, MBA Signed: 03/14/2020 5:19:54 PM By: Levan Hurst RN, BSN Entered By:  Levan Hurst on 03/13/2020  14:06:44 -------------------------------------------------------------------------------- Problem List Details Patient Name: Date of Service: [name], [name] 03/13/2020 1:15 PM Medical Record Number: [id number] Patient Account Number: [id number] Date of Birth/Sex: Treating RN: [birth year] (86 y.o. [name] Primary Care Provider: Hollace Kinnier Other Clinician: Referring Provider: Treating Provider/Extender: Katrinka Blazing in Treatment: 21 Active Problems ICD-10 Encounter Code Description Active Date MDM Diagnosis L89.154 Pressure ulcer of sacral region, stage 4 10/17/2019 No Yes E11.621 Type 2 diabetes mellitus with foot ulcer 10/17/2019 No Yes L97.522 Non-pressure chronic ulcer of other part of left foot with fat layer exposed 10/17/2019 No Yes F01.50 Vascular dementia without behavioral disturbance 10/17/2019 No Yes I10 Essential (primary) hypertension 10/17/2019 No Yes Inactive Problems Resolved Problems Electronic Signature(s) Signed: 03/13/2020 5:03:09 PM By: Tobi Bastos MD, MBA Signed: 03/14/2020 5:19:54 PM By: Levan Hurst RN, BSN Entered By: Levan Hurst on 03/13/2020 13:58:25 -------------------------------------------------------------------------------- Progress Note Details Patient Name: Date of Service: [name], [name] 03/13/2020 1:15 PM Medical Record Number: [id number] Patient Account Number: [id number] Date of Birth/Sex: Treating RN: February 06, 1922 (86 y.o. [name] Primary Care Provider: Hollace Kinnier Other Clinician: Referring Provider: Treating Provider/Extender: Katrinka Blazing in Treatment: 21 Subjective History of Present Illness (HPI) 10/17/2019 upon evaluation today patient presents for initial inspection here in our clinic concerning issues that has been having with a wound in the sacral region as well as the left heel. Fortunately neither appears to be overtly and significantly infected there is  some necrotic tissue noted at both locations however that is going to be required to be addressed. He does have a history of diabetes mellitus type 2, vascular dementia, and hypertension. The patient did have a stroke in November 2020 unfortunately he has not been the same since that time according to his daughter. Prior to that he was taking care of himself now he is 100% dependent. He is at home with his daughter who is present during the office visit today as well that is where I get the majority of the history from at this point. Patient does have a history of hypertension as well which does not appear to be too out of control today which is good news. He does have a hemoglobin A1c of 6.05 May 2019. They currently have been using a calcium alginate dressing to the sacrum and it sounds like Skin-Prep and a foam on the heel. 4/9; pressure ulcers on the sacral area and left heel. Been using Anasept wet-to-dry on the sacrum and Hydrofera Blue in the heel. He has home health family is helping with the dressing. It sounds as though they are fairly religious about offloading these areas that have pressure relief surfaces for the bed. Wounds look better 4/30; I have not seen this patient in 3 weeks. We have been using Anasept wet-to-dry the sacrum and Hydrofera Blue on the heel. When I saw this 3 weeks ago the sacrum actually look quite good and I thought that we might be able to transition him to a collagen-based dressing today. 5/10; the since the patient was last here he was hospitalized from 5/5 through 12/07/2019. He was noted to have a pressure injury of the sacral region stage IV. MRI did not suggest discitis or osteomyelitis. There was nonspecific bilateral paraspinal muscle edema. Culture I did when he was here the last time showed E. coli I believe they are aware of this. Blood cultures were negative. His white count was 10.8 on admission 11.9  at discharge his comprehensive metabolic  panel notable for an albumin of 2.4 BUN of 32 with creatinine of 0.03 hemoglobin was only 7.3 with a discharge hemoglobin of 8.1 after dropping to a hemoglobin of 6.6 He is back at home. Discharge antibiotic was Augmentin.[name] He was treated with IV Rocephin in the hospital. The patient was seen by palliative care I have not looked at this consult. They have been using dressings that they had left over at home I think most recently [facility]. According to his daughter the patient is eating fairly well 12/28/19-Patient is back at 2 weeks, the sacral wound is about the same, the right heel has healed and closed. We are using silver alginate to the sacral wound 6/18; we continue to follow the sacral wound. His heel wounds have closed. We have been using silver alginate to the sacrum 7/1; sacral wound. We have been using silver collagen with backing wet-to-dry. No open wounds on the heels. 7/15; some improvement in the dimensions of the sacral wound including the undermining area we have been using silver collagen with backing wet-to-dry dressings. We had some thoughts about trying a wound VAC. He has home health. 03/13/20-Sacral wound looks intact with healthy base, surrounding skin intact, no maceration, he did unfortunately develop to abrasion wounds on his first second and third toes dorsum of the Right foot this happened when his foot got trapped below the wheelchair Footguard Objective Constitutional alert and oriented x 3. sitting or standing blood pressure is within target range for patient.. supine blood pressure is within target range for patient.. pulse regular and within target range for patient.[name] respirations regular, non-labored and within target range for patient.[name] temperature within target range for patient.. Well- nourished and well-hydrated in no acute distress. Vitals Time Taken: 1:22 PM, Height: 71 in, Weight: 175 lbs, BMI: 24.4, Temperature: 97.7 F, Pulse: 116 bpm, Respiratory  Rate: 18 breaths/min, Blood Pressure: 139/73 mmHg. General Notes: Right first second and third toes dorsal abrasions noted nailbed and nail intact Sacral wound appears stable with granulation at base no necrotic areas Integumentary (Hair, Skin) Wound #1 status is Open. Original cause of wound was Pressure Injury. The wound is located on the Sacrum. The wound measures 3.2cm length x 1.7cm width x 1cm depth; 4.273cm^2 area and 4.273cm^3 volume. There is bone, muscle, and Fat Layer (Subcutaneous Tissue) Exposed exposed. There is no tunneling or undermining noted. There is a medium amount of serosanguineous drainage noted. The wound margin is well defined and not attached to the wound base. There is medium (34-66%) pink, pale, friable granulation within the wound bed. There is a medium (34-66%) amount of necrotic tissue within the wound bed including Adherent Slough. Wound #3 status is Open. Original cause of wound was Trauma. The wound is located on the Right T Great. The wound measures 1cm length x 1.2cm width x oe 0.1cm depth; 0.942cm^2 area and 0.094cm^3 volume. There is no tunneling or undermining noted. There is a medium amount of sanguinous drainage noted. There is no granulation within the wound bed. There is no necrotic tissue within the wound bed. Wound #4 status is Open. Original cause of wound was Trauma. The wound is located on the Right T Second. The wound measures 3.1cm length x 1cm width oe x 0.2cm depth; 2.435cm^2 area and 0.487cm^3 volume. There is bone and Fat Layer (Subcutaneous Tissue) Exposed exposed. There is no tunneling or undermining noted. There is a medium amount of serosanguineous drainage noted. There is no granulation  within the wound bed. There is no necrotic tissue within the wound bed. Wound #5 status is Open. Original cause of wound was Trauma. The wound is located on the Right T Third. The wound measures 1cm length x 0.5cm width x oe 0.1cm depth; 0.393cm^2 area  and 0.039cm^3 volume. There is no tunneling or undermining noted. There is a small amount of serosanguineous drainage noted. There is no granulation within the wound bed. There is no necrotic tissue within the wound bed. Assessment Active Problems ICD-10 Pressure ulcer of sacral region, stage 4 Type 2 diabetes mellitus with foot ulcer Non-pressure chronic ulcer of other part of left foot with fat layer exposed Vascular dementia without behavioral disturbance Essential (primary) hypertension Plan Follow-up Appointments: Return Appointment in 2 weeks. - ***[facility] - ROOM 5*** Dressing Change Frequency: Change dressing three times week. - all wounds Skin Barriers/Peri-Wound Care: Wound #1 Sacrum: Skin Prep - to periwound Wound Cleansing: Wound #1 Sacrum: Clean wound with Wound Cleanser Primary Wound Dressing: Wound #1 Sacrum: Other: - wet-to-dry in today in clinic, home health to apply wound vac Wound #3 Right T Great: oe Calcium Alginate with Silver Wound #4 Right T Second: oe Calcium Alginate with Silver Wound #5 Right T Third: oe Calcium Alginate with Silver Secondary Dressing: Wound #1 Sacrum: Foam Border Wound #3 Right T Great: oe Kerlix/Rolled Gauze Dry Gauze Wound #4 Right T Second: oe Kerlix/Rolled Gauze Dry Gauze Wound #5 Right T Third: oe Kerlix/Rolled Gauze Dry Gauze Negative Presssure Wound Therapy: Wound #1 Sacrum: Medela Wound Vac continuously at 139mm/hg Black Foam Off-Loading: Low air-loss mattress (Group 2) Turn and reposition every 2 hours Other: - float heels with pillows under calves while in bed Home Health: [facility] [facility] for wound care. - [facility] -Continue NP WT to the sacral wound -Silver alginate will be used for the toe wounds on the right foot first second and third toe dorsal areas -Return to clinic in 2 weeks Electronic Signature(s) Signed: 03/13/2020 2:08:33 PM By: Tobi Bastos MD, MBA Previous  Signature: 03/13/2020 2:07:21 PM Version By: Tobi Bastos MD, MBA Entered By: Tobi Bastos on 03/13/2020 14:08:32 -------------------------------------------------------------------------------- SuperBill Details Patient Name: Date of Service: [facility] 03/13/2020 Medical Record Number: [id number] Patient Account Number: [id number] Date of Birth/Sex: Treating RN: 1922/03/27 (86 y.o. [name] Primary Care Provider: Hollace Kinnier Other Clinician: Referring Provider: Treating Provider/Extender: Katrinka Blazing in Treatment: 21 Diagnosis Coding ICD-10 Codes Code Description [phone] Pressure ulcer of sacral region, stage 4 E11.621 Type 2 diabetes mellitus with foot ulcer L97.522 Non-pressure chronic ulcer of other part of left foot with fat layer exposed F01.50 Vascular dementia without behavioral disturbance I10 Essential (primary) hypertension Facility Procedures CPT4 Code: [id number] Description: [phone] - WOUND CARE VISIT-LEV 5 EST PT Modifier: Quantity: 1 Physician Procedures : CPT4 Code Description Modifier [id number] - WC PHYS LEVEL 3 - EST PT ICD-10 Diagnosis Description L89.154 Pressure ulcer of sacral region, stage 4 Quantity: 1 Electronic Signature(s) Signed: 03/13/2020 5:03:09 PM By: Tobi Bastos MD, MBA Signed: 03/14/2020 5:19:54 PM By: Levan Hurst RN, BSN Previous Signature: 03/13/2020 2:08:46 PM Version By: Tobi Bastos MD, MBA Entered By: Levan Hurst on 03/13/2020 14:14:05

## 2020-03-17 DIAGNOSIS — I5032 Chronic diastolic (congestive) heart failure: Secondary | ICD-10-CM | POA: Diagnosis not present

## 2020-03-17 DIAGNOSIS — N183 Chronic kidney disease, stage 3 unspecified: Secondary | ICD-10-CM | POA: Diagnosis not present

## 2020-03-17 DIAGNOSIS — I130 Hypertensive heart and chronic kidney disease with heart failure and stage 1 through stage 4 chronic kidney disease, or unspecified chronic kidney disease: Secondary | ICD-10-CM | POA: Diagnosis not present

## 2020-03-17 DIAGNOSIS — L89154 Pressure ulcer of sacral region, stage 4: Secondary | ICD-10-CM | POA: Diagnosis not present

## 2020-03-17 DIAGNOSIS — L89626 Pressure-induced deep tissue damage of left heel: Secondary | ICD-10-CM | POA: Diagnosis not present

## 2020-03-17 DIAGNOSIS — E1122 Type 2 diabetes mellitus with diabetic chronic kidney disease: Secondary | ICD-10-CM | POA: Diagnosis not present

## 2020-03-17 DIAGNOSIS — F0391 Unspecified dementia with behavioral disturbance: Secondary | ICD-10-CM | POA: Diagnosis not present

## 2020-03-17 DIAGNOSIS — I69351 Hemiplegia and hemiparesis following cerebral infarction affecting right dominant side: Secondary | ICD-10-CM | POA: Diagnosis not present

## 2020-03-17 DIAGNOSIS — I2699 Other pulmonary embolism without acute cor pulmonale: Secondary | ICD-10-CM | POA: Diagnosis not present

## 2020-03-19 DIAGNOSIS — I5032 Chronic diastolic (congestive) heart failure: Secondary | ICD-10-CM | POA: Diagnosis not present

## 2020-03-19 DIAGNOSIS — L89154 Pressure ulcer of sacral region, stage 4: Secondary | ICD-10-CM | POA: Diagnosis not present

## 2020-03-19 DIAGNOSIS — I130 Hypertensive heart and chronic kidney disease with heart failure and stage 1 through stage 4 chronic kidney disease, or unspecified chronic kidney disease: Secondary | ICD-10-CM | POA: Diagnosis not present

## 2020-03-19 DIAGNOSIS — L89626 Pressure-induced deep tissue damage of left heel: Secondary | ICD-10-CM | POA: Diagnosis not present

## 2020-03-19 DIAGNOSIS — I2699 Other pulmonary embolism without acute cor pulmonale: Secondary | ICD-10-CM | POA: Diagnosis not present

## 2020-03-19 DIAGNOSIS — E1122 Type 2 diabetes mellitus with diabetic chronic kidney disease: Secondary | ICD-10-CM | POA: Diagnosis not present

## 2020-03-19 DIAGNOSIS — F0391 Unspecified dementia with behavioral disturbance: Secondary | ICD-10-CM | POA: Diagnosis not present

## 2020-03-19 DIAGNOSIS — N183 Chronic kidney disease, stage 3 unspecified: Secondary | ICD-10-CM | POA: Diagnosis not present

## 2020-03-19 DIAGNOSIS — I69351 Hemiplegia and hemiparesis following cerebral infarction affecting right dominant side: Secondary | ICD-10-CM | POA: Diagnosis not present

## 2020-03-20 DIAGNOSIS — L89302 Pressure ulcer of unspecified buttock, stage 2: Secondary | ICD-10-CM | POA: Diagnosis not present

## 2020-03-21 DIAGNOSIS — F0391 Unspecified dementia with behavioral disturbance: Secondary | ICD-10-CM | POA: Diagnosis not present

## 2020-03-21 DIAGNOSIS — I2699 Other pulmonary embolism without acute cor pulmonale: Secondary | ICD-10-CM | POA: Diagnosis not present

## 2020-03-21 DIAGNOSIS — I130 Hypertensive heart and chronic kidney disease with heart failure and stage 1 through stage 4 chronic kidney disease, or unspecified chronic kidney disease: Secondary | ICD-10-CM | POA: Diagnosis not present

## 2020-03-21 DIAGNOSIS — N183 Chronic kidney disease, stage 3 unspecified: Secondary | ICD-10-CM | POA: Diagnosis not present

## 2020-03-21 DIAGNOSIS — E1122 Type 2 diabetes mellitus with diabetic chronic kidney disease: Secondary | ICD-10-CM | POA: Diagnosis not present

## 2020-03-21 DIAGNOSIS — L89154 Pressure ulcer of sacral region, stage 4: Secondary | ICD-10-CM | POA: Diagnosis not present

## 2020-03-21 DIAGNOSIS — I5032 Chronic diastolic (congestive) heart failure: Secondary | ICD-10-CM | POA: Diagnosis not present

## 2020-03-21 DIAGNOSIS — L89626 Pressure-induced deep tissue damage of left heel: Secondary | ICD-10-CM | POA: Diagnosis not present

## 2020-03-21 DIAGNOSIS — I69351 Hemiplegia and hemiparesis following cerebral infarction affecting right dominant side: Secondary | ICD-10-CM | POA: Diagnosis not present

## 2020-03-22 DIAGNOSIS — L89302 Pressure ulcer of unspecified buttock, stage 2: Secondary | ICD-10-CM | POA: Diagnosis not present

## 2020-03-24 DIAGNOSIS — I2699 Other pulmonary embolism without acute cor pulmonale: Secondary | ICD-10-CM | POA: Diagnosis not present

## 2020-03-24 DIAGNOSIS — F0391 Unspecified dementia with behavioral disturbance: Secondary | ICD-10-CM | POA: Diagnosis not present

## 2020-03-24 DIAGNOSIS — E1122 Type 2 diabetes mellitus with diabetic chronic kidney disease: Secondary | ICD-10-CM | POA: Diagnosis not present

## 2020-03-24 DIAGNOSIS — I5032 Chronic diastolic (congestive) heart failure: Secondary | ICD-10-CM | POA: Diagnosis not present

## 2020-03-24 DIAGNOSIS — I69351 Hemiplegia and hemiparesis following cerebral infarction affecting right dominant side: Secondary | ICD-10-CM | POA: Diagnosis not present

## 2020-03-24 DIAGNOSIS — L89154 Pressure ulcer of sacral region, stage 4: Secondary | ICD-10-CM | POA: Diagnosis not present

## 2020-03-24 DIAGNOSIS — N183 Chronic kidney disease, stage 3 unspecified: Secondary | ICD-10-CM | POA: Diagnosis not present

## 2020-03-24 DIAGNOSIS — L89626 Pressure-induced deep tissue damage of left heel: Secondary | ICD-10-CM | POA: Diagnosis not present

## 2020-03-24 DIAGNOSIS — I130 Hypertensive heart and chronic kidney disease with heart failure and stage 1 through stage 4 chronic kidney disease, or unspecified chronic kidney disease: Secondary | ICD-10-CM | POA: Diagnosis not present

## 2020-03-26 DIAGNOSIS — F0391 Unspecified dementia with behavioral disturbance: Secondary | ICD-10-CM | POA: Diagnosis not present

## 2020-03-26 DIAGNOSIS — L89626 Pressure-induced deep tissue damage of left heel: Secondary | ICD-10-CM | POA: Diagnosis not present

## 2020-03-26 DIAGNOSIS — I2699 Other pulmonary embolism without acute cor pulmonale: Secondary | ICD-10-CM | POA: Diagnosis not present

## 2020-03-26 DIAGNOSIS — I69351 Hemiplegia and hemiparesis following cerebral infarction affecting right dominant side: Secondary | ICD-10-CM | POA: Diagnosis not present

## 2020-03-26 DIAGNOSIS — I130 Hypertensive heart and chronic kidney disease with heart failure and stage 1 through stage 4 chronic kidney disease, or unspecified chronic kidney disease: Secondary | ICD-10-CM | POA: Diagnosis not present

## 2020-03-26 DIAGNOSIS — L89154 Pressure ulcer of sacral region, stage 4: Secondary | ICD-10-CM | POA: Diagnosis not present

## 2020-03-26 DIAGNOSIS — E1122 Type 2 diabetes mellitus with diabetic chronic kidney disease: Secondary | ICD-10-CM | POA: Diagnosis not present

## 2020-03-26 DIAGNOSIS — N183 Chronic kidney disease, stage 3 unspecified: Secondary | ICD-10-CM | POA: Diagnosis not present

## 2020-03-26 DIAGNOSIS — I5032 Chronic diastolic (congestive) heart failure: Secondary | ICD-10-CM | POA: Diagnosis not present

## 2020-03-27 ENCOUNTER — Encounter (HOSPITAL_BASED_OUTPATIENT_CLINIC_OR_DEPARTMENT_OTHER): Payer: Medicare HMO | Admitting: Internal Medicine

## 2020-03-28 DIAGNOSIS — I69351 Hemiplegia and hemiparesis following cerebral infarction affecting right dominant side: Secondary | ICD-10-CM | POA: Diagnosis not present

## 2020-03-28 DIAGNOSIS — E1122 Type 2 diabetes mellitus with diabetic chronic kidney disease: Secondary | ICD-10-CM | POA: Diagnosis not present

## 2020-03-28 DIAGNOSIS — I2699 Other pulmonary embolism without acute cor pulmonale: Secondary | ICD-10-CM | POA: Diagnosis not present

## 2020-03-28 DIAGNOSIS — F0391 Unspecified dementia with behavioral disturbance: Secondary | ICD-10-CM | POA: Diagnosis not present

## 2020-03-28 DIAGNOSIS — I130 Hypertensive heart and chronic kidney disease with heart failure and stage 1 through stage 4 chronic kidney disease, or unspecified chronic kidney disease: Secondary | ICD-10-CM | POA: Diagnosis not present

## 2020-03-28 DIAGNOSIS — N183 Chronic kidney disease, stage 3 unspecified: Secondary | ICD-10-CM | POA: Diagnosis not present

## 2020-03-28 DIAGNOSIS — L89154 Pressure ulcer of sacral region, stage 4: Secondary | ICD-10-CM | POA: Diagnosis not present

## 2020-03-28 DIAGNOSIS — I5032 Chronic diastolic (congestive) heart failure: Secondary | ICD-10-CM | POA: Diagnosis not present

## 2020-03-28 DIAGNOSIS — L89626 Pressure-induced deep tissue damage of left heel: Secondary | ICD-10-CM | POA: Diagnosis not present

## 2020-03-31 ENCOUNTER — Encounter (HOSPITAL_BASED_OUTPATIENT_CLINIC_OR_DEPARTMENT_OTHER): Payer: Medicare HMO | Admitting: Internal Medicine

## 2020-03-31 ENCOUNTER — Other Ambulatory Visit (HOSPITAL_COMMUNITY)
Admission: RE | Admit: 2020-03-31 | Discharge: 2020-03-31 | Disposition: A | Discharge status: Home / Self Care | Payer: Medicare HMO | Source: Other Acute Inpatient Hospital | Attending: Internal Medicine | Admitting: Internal Medicine

## 2020-03-31 DIAGNOSIS — L97512 Non-pressure chronic ulcer of other part of right foot with fat layer exposed: Secondary | ICD-10-CM | POA: Diagnosis not present

## 2020-03-31 DIAGNOSIS — L03031 Cellulitis of right toe: Secondary | ICD-10-CM | POA: Diagnosis not present

## 2020-03-31 DIAGNOSIS — L89154 Pressure ulcer of sacral region, stage 4: Secondary | ICD-10-CM | POA: Diagnosis not present

## 2020-03-31 DIAGNOSIS — I130 Hypertensive heart and chronic kidney disease with heart failure and stage 1 through stage 4 chronic kidney disease, or unspecified chronic kidney disease: Secondary | ICD-10-CM | POA: Diagnosis not present

## 2020-03-31 DIAGNOSIS — L89626 Pressure-induced deep tissue damage of left heel: Secondary | ICD-10-CM | POA: Diagnosis not present

## 2020-03-31 DIAGNOSIS — I10 Essential (primary) hypertension: Secondary | ICD-10-CM | POA: Diagnosis not present

## 2020-03-31 DIAGNOSIS — N183 Chronic kidney disease, stage 3 unspecified: Secondary | ICD-10-CM | POA: Diagnosis not present

## 2020-03-31 DIAGNOSIS — F0150 Vascular dementia without behavioral disturbance: Secondary | ICD-10-CM | POA: Diagnosis not present

## 2020-03-31 DIAGNOSIS — H409 Unspecified glaucoma: Secondary | ICD-10-CM | POA: Diagnosis not present

## 2020-03-31 DIAGNOSIS — L03115 Cellulitis of right lower limb: Secondary | ICD-10-CM | POA: Diagnosis not present

## 2020-03-31 DIAGNOSIS — F0391 Unspecified dementia with behavioral disturbance: Secondary | ICD-10-CM | POA: Diagnosis not present

## 2020-03-31 DIAGNOSIS — E11621 Type 2 diabetes mellitus with foot ulcer: Secondary | ICD-10-CM | POA: Diagnosis not present

## 2020-03-31 DIAGNOSIS — L97518 Non-pressure chronic ulcer of other part of right foot with other specified severity: Secondary | ICD-10-CM | POA: Diagnosis not present

## 2020-03-31 DIAGNOSIS — L97522 Non-pressure chronic ulcer of other part of left foot with fat layer exposed: Secondary | ICD-10-CM | POA: Diagnosis not present

## 2020-03-31 DIAGNOSIS — E11622 Type 2 diabetes mellitus with other skin ulcer: Secondary | ICD-10-CM | POA: Diagnosis not present

## 2020-03-31 DIAGNOSIS — E1122 Type 2 diabetes mellitus with diabetic chronic kidney disease: Secondary | ICD-10-CM | POA: Diagnosis not present

## 2020-03-31 DIAGNOSIS — I2699 Other pulmonary embolism without acute cor pulmonale: Secondary | ICD-10-CM | POA: Diagnosis not present

## 2020-03-31 DIAGNOSIS — I69351 Hemiplegia and hemiparesis following cerebral infarction affecting right dominant side: Secondary | ICD-10-CM | POA: Diagnosis not present

## 2020-03-31 DIAGNOSIS — I5032 Chronic diastolic (congestive) heart failure: Secondary | ICD-10-CM | POA: Diagnosis not present

## 2020-03-31 NOTE — Progress Notes (Signed)
GERMAINE, SHENKER ([id number]) Visit Report for 03/31/2020 Debridement Details Patient Name: Date of Service: [name], [name] 03/31/2020 1:15 PM Medical Record Number: [id number] Patient Account Number: [id number] Date of Birth/Sex: Treating RN: 1921-09-06 (86 y.o. [name] Primary Care Provider: Hollace Kinnier Other Clinician: Referring Provider: Treating Provider/Extender: Frederick Peers in Treatment: 23 Debridement Performed for Assessment: Wound #3 Right T Great oe Performed By: Physician Ricard Dillon., MD Debridement Type: Debridement Severity of Tissue Pre Debridement: Fat layer exposed Level of Consciousness (Pre-procedure): Awake and Alert Pre-procedure Verification/Time Out Yes - 13:58 Taken: Start Time: 13:58 T Area Debrided (L x W): otal 0.7 (cm) x 1 (cm) = 0.7 (cm) Tissue and other material debrided: Viable, Non-Viable, Eschar, Subcutaneous Level: Skin/Subcutaneous Tissue Debridement Description: Excisional Instrument: Curette Bleeding: Minimum Hemostasis Achieved: Pressure End Time: 14:00 Procedural Pain: 0 Post Procedural Pain: 0 Response to Treatment: Procedure was tolerated well Level of Consciousness (Post- Awake and Alert procedure): Post Debridement Measurements of Total Wound Length: (cm) 0.7 Width: (cm) 1 Depth: (cm) 0.7 Volume: (cm) 0.385 Character of Wound/Ulcer Post Debridement: Improved Severity of Tissue Post Debridement: Fat layer exposed Post Procedure Diagnosis Same as Pre-procedure Electronic Signature(s) Signed: 03/31/2020 4:22:50 PM By: Linton Ham MD Signed: 03/31/2020 4:24:12 PM By: Levan Hurst RN, BSN Entered By: Linton Ham on 03/31/2020 14:26:40 -------------------------------------------------------------------------------- Debridement Details Patient Name: Date of Service: [facility] [name], [name] 03/31/2020 1:15 PM Medical Record Number: [id number] Patient Account Number: [id number] Date of  Birth/Sex: Treating RN: May 28, 1922 (86 y.o. [name] Primary Care Provider: Hollace Kinnier Other Clinician: Referring Provider: Treating Provider/Extender: Frederick Peers in Treatment: 23 Debridement Performed for Assessment: Wound #4 Right T Second oe Performed By: Physician Ricard Dillon., MD Debridement Type: Debridement Severity of Tissue Pre Debridement: Fat layer exposed Level of Consciousness (Pre-procedure): Awake and Alert Pre-procedure Verification/Time Out Yes - 13:58 Taken: Start Time: 13:58 T Area Debrided (L x W): otal 2.5 (cm) x 0.5 (cm) = 1.25 (cm) Tissue and other material debrided: Viable, Non-Viable, Eschar, Subcutaneous Level: Skin/Subcutaneous Tissue Debridement Description: Excisional Instrument: Curette Bleeding: Minimum Hemostasis Achieved: Pressure End Time: 14:00 Procedural Pain: 0 Post Procedural Pain: 0 Response to Treatment: Procedure was tolerated well Level of Consciousness (Post- Awake and Alert procedure): Post Debridement Measurements of Total Wound Length: (cm) 2.5 Width: (cm) 0.5 Depth: (cm) 0.1 Volume: (cm) 0.098 Character of Wound/Ulcer Post Debridement: Improved Severity of Tissue Post Debridement: Fat layer exposed Post Procedure Diagnosis Same as Pre-procedure Electronic Signature(s) Signed: 03/31/2020 4:22:50 PM By: Linton Ham MD Signed: 03/31/2020 4:24:12 PM By: Levan Hurst RN, BSN Entered By: Linton Ham on 03/31/2020 14:26:50 -------------------------------------------------------------------------------- HPI Details Patient Name: Date of Service: [name], [name] 03/31/2020 1:15 PM Medical Record Number: [id number] Patient Account Number: [id number] Date of Birth/Sex: Treating RN: [birth year] (86 y.o. [name] Primary Care Provider: Hollace Kinnier Other Clinician: Referring Provider: Treating Provider/Extender: Frederick Peers in Treatment: 23 History  of Present Illness HPI Description: 10/17/2019 upon evaluation today patient presents for initial inspection here in our clinic concerning issues that has been having with a wound in the sacral region as well as the left heel. Fortunately neither appears to be overtly and significantly infected there is some necrotic tissue noted at both locations however that is going to be required to be addressed. He does have a history of diabetes mellitus type 2, vascular dementia, and hypertension. The patient did have a stroke in November 2020 unfortunately he  has not been the same since that time according to his daughter. Prior to that he was taking care of himself now he is 100% dependent. He is at home with his daughter who is present during the office visit today as well that is where I get the majority of the history from at this point. Patient does have a history of hypertension as well which does not appear to be too out of control today which is good news. He does have a hemoglobin A1c of 6.05 May 2019. They currently have been using a calcium alginate dressing to the sacrum and it sounds like Skin-Prep and a foam on the heel. 4/9; pressure ulcers on the sacral area and left heel. Been using Anasept wet-to-dry on the sacrum and Hydrofera Blue in the heel. He has home health family is helping with the dressing. It sounds as though they are fairly religious about offloading these areas that have pressure relief surfaces for the bed. Wounds look better 4/30; I have not seen this patient in 3 weeks. We have been using Anasept wet-to-dry the sacrum and Hydrofera Blue on the heel. When I saw this 3 weeks ago the sacrum actually look quite good and I thought that we might be able to transition him to a collagen-based dressing today. 5/10; the since the patient was last here he was hospitalized from 5/5 through 12/07/2019. He was noted to have a pressure injury of the sacral region stage IV. MRI did not  suggest discitis or osteomyelitis. There was nonspecific bilateral paraspinal muscle edema. Culture I did when he was here the last time showed E. coli I believe they are aware of this. Blood cultures were negative. His white count was 10.8 on admission 11.9 at discharge his comprehensive metabolic panel notable for an albumin of 2.4 BUN of 32 with creatinine of 0.03 hemoglobin was only 7.3 with a discharge hemoglobin of 8.1 after dropping to a hemoglobin of 6.6 He is back at home. Discharge antibiotic was Augmentin.[name] He was treated with IV Rocephin in the hospital. The patient was seen by palliative care I have not looked at this consult. They have been using dressings that they had left over at home I think most recently [facility]. According to his daughter the patient is eating fairly well 12/28/19-Patient is back at 2 weeks, the sacral wound is about the same, the right heel has healed and closed. We are using silver alginate to the sacral wound 6/18; we continue to follow the sacral wound. His heel wounds have closed. We have been using silver alginate to the sacrum 7/1; sacral wound. We have been using silver collagen with backing wet-to-dry. No open wounds on the heels. 7/15; some improvement in the dimensions of the sacral wound including the undermining area we have been using silver collagen with backing wet-to-dry dressings. We had some thoughts about trying a wound VAC. He has home health. 03/13/20-Sacral wound looks intact with healthy base, surrounding skin intact, no maceration, he did unfortunately develop to abrasion wounds on his first second and third toes dorsum of the Right foot this happened when his foot got trapped below the wheelchair Footguard 8/30; sacral wound has come down slightly especially the undermining. We have been using a wound VAC She has 2 necrotic areas on the tip of the right first toe and the right second toe dorsally. Electronic Signature(s) Signed:  03/31/2020 4:22:50 PM By: Linton Ham MD Entered By: Linton Ham on 03/31/2020 14:27:41 -------------------------------------------------------------------------------- Physical Exam  Details Patient Name: Date of Service: [name], [name] 03/31/2020 1:15 PM Medical Record Number: [id number] Patient Account Number: [id number] Date of Birth/Sex: Treating RN: [birth year] (86 y.o. [name] Primary Care Provider: Hollace Kinnier Other Clinician: Referring Provider: Treating Provider/Extender: Frederick Peers in Treatment: 23 Constitutional Sitting or standing Blood Pressure is within target range for patient.. Pulse regular and within target range for patient.[name] Respirations regular, non-labored and within target range.. Temperature is normal and within the target range for the patient.[name] Appears in no distress. Notes Wound exam -Right first and second toes thick eschar which did not look viable for healing. I used a #3 curette to remove eschar from both of these areas. On the first toe I was greeted by a relatively large amount of pus draining out. This was cultured The wound on his lower back actually does not look too bad. It does come in in terms of dimensions as well as undermining but not depth. There is no exposed bone Electronic Signature(s) Signed: 03/31/2020 4:22:50 PM By: Linton Ham MD Entered By: Linton Ham on 03/31/2020 14:29:08 -------------------------------------------------------------------------------- Physician Orders Details Patient Name: Date of Service: [name], [name] 03/31/2020 1:15 PM Medical Record Number: [id number] Patient Account Number: [id number] Date of Birth/Sex: Treating RN: 03-20-1922 (86 y.o. [name] Primary Care Provider: Hollace Kinnier Other Clinician: Referring Provider: Treating Provider/Extender: Frederick Peers in Treatment: 23 Verbal / Phone Orders: No Diagnosis Coding ICD-10  Coding Code Description L89.154 Pressure ulcer of sacral region, stage 4 E11.621 Type 2 diabetes mellitus with foot ulcer L97.522 Non-pressure chronic ulcer of other part of left foot with fat layer exposed F01.50 Vascular dementia without behavioral disturbance I10 Essential (primary) hypertension Follow-up Appointments ppointment in 2 weeks. - ***[facility] - ROOM 5*** Return A Dressing Change Frequency Change dressing three times week. - all wounds Skin Barriers/Peri-Wound Care Wound #1 Sacrum Skin Prep - to periwound Wound Cleansing Clean wound with Wound Cleanser - or normal saline Primary Wound Dressing Wound #1 Sacrum Other: - wet-to-dry in today in clinic, home health to apply wound vac Wound #3 Right T Great oe Calcium Alginate with Silver Wound #4 Right T Second oe Calcium Alginate with Silver Wound #5 Right T Third oe Calcium Alginate with Silver Secondary Dressing Wound #1 Sacrum Foam Border Wound #3 Right T Great oe Kerlix/Rolled Gauze Dry Gauze Wound #4 Right T Second oe Kerlix/Rolled Gauze Dry Gauze Wound #5 Right T Third oe Kerlix/Rolled Gauze Dry Gauze Negative Presssure Wound Therapy Wound #1 Sacrum Medela Wound Vac continuously at 174mm/hg Black Foam Off-Loading Low air-loss mattress (Group 2) Turn and reposition every 2 hours Other: - float heels with pillows under calves while in bed [facility] skilled nursing for wound care. - [facility] naerobe culture (MICRO) - Right great toe - (ICD10 E11.621 - Type 2 diabetes mellitus with foot Bacteria identified in Unspecified specimen by A ulcer) LOINC Code: 423-5 Convenience Name: Anerobic culture Patient Medications llergies: lisinopril A Notifications Medication Indication Start End wound infection 03/31/2020 cephalexin DOSE oral 500 mg tablet - 1 tablet oral tid for 7 days Electronic Signature(s) Signed: 03/31/2020 2:32:35 PM By: Linton Ham MD Entered  By: Linton Ham on 03/31/2020 14:32:34 -------------------------------------------------------------------------------- Problem List Details Patient Name: Date of Service: [address] [name], [name] 03/31/2020 1:15 PM Medical Record Number: [id number] Patient Account Number: [id number] Date of Birth/Sex: Treating RN: [birth year] (86 y.o. [name] Primary Care Provider: Hollace Kinnier Other Clinician: Referring  Provider: Treating Provider/Extender: Frederick Peers in Treatment: 23 Active Problems ICD-10 Encounter Code Description Active Date MDM Diagnosis L89.154 Pressure ulcer of sacral region, stage 4 10/17/2019 No Yes E11.621 Type 2 diabetes mellitus with foot ulcer 10/17/2019 No Yes L97.518 Non-pressure chronic ulcer of other part of right foot with other specified 03/31/2020 No Yes severity L03.031 Cellulitis of right toe 03/31/2020 No Yes Inactive Problems ICD-10 Code Description Active Date Inactive Date L97.522 Non-pressure chronic ulcer of other part of left foot with fat layer exposed 10/17/2019 10/17/2019 F01.50 Vascular dementia without behavioral disturbance 10/17/2019 10/17/2019 I10 Essential (primary) hypertension 10/17/2019 10/17/2019 Resolved Problems Electronic Signature(s) Signed: 03/31/2020 4:22:50 PM By: Linton Ham MD Entered By: Linton Ham on 03/31/2020 14:04:08 -------------------------------------------------------------------------------- Progress Note Details Patient Name: Date of Service: [name], [name] 03/31/2020 1:15 PM Medical Record Number: [id number] Patient Account Number: [id number] Date of Birth/Sex: Treating RN: 05-13-1922 (86 y.o. [name] Primary Care Provider: Hollace Kinnier Other Clinician: Referring Provider: Treating Provider/Extender: Frederick Peers in Treatment: 23 Subjective History of Present Illness (HPI) 10/17/2019 upon evaluation today patient presents for initial inspection  here in our clinic concerning issues that has been having with a wound in the sacral region as well as the left heel. Fortunately neither appears to be overtly and significantly infected there is some necrotic tissue noted at both locations however that is going to be required to be addressed. He does have a history of diabetes mellitus type 2, vascular dementia, and hypertension. The patient did have a stroke in November 2020 unfortunately he has not been the same since that time according to his daughter. Prior to that he was taking care of himself now he is 100% dependent. He is at home with his daughter who is present during the office visit today as well that is where I get the majority of the history from at this point. Patient does have a history of hypertension as well which does not appear to be too out of control today which is good news. He does have a hemoglobin A1c of 6.05 May 2019. They currently have been using a calcium alginate dressing to the sacrum and it sounds like Skin-Prep and a foam on the heel. 4/9; pressure ulcers on the sacral area and left heel. Been using Anasept wet-to-dry on the sacrum and Hydrofera Blue in the heel. He has home health family is helping with the dressing. It sounds as though they are fairly religious about offloading these areas that have pressure relief surfaces for the bed. Wounds look better 4/30; I have not seen this patient in 3 weeks. We have been using Anasept wet-to-dry the sacrum and Hydrofera Blue on the heel. When I saw this 3 weeks ago the sacrum actually look quite good and I thought that we might be able to transition him to a collagen-based dressing today. 5/10; the since the patient was last here he was hospitalized from 5/5 through 12/07/2019. He was noted to have a pressure injury of the sacral region stage IV. MRI did not suggest discitis or osteomyelitis. There was nonspecific bilateral paraspinal muscle edema. Culture I did when he  was here the last time showed E. coli I believe they are aware of this. Blood cultures were negative. His white count was 10.8 on admission 11.9 at discharge his comprehensive metabolic panel notable for an albumin of 2.4 BUN of 32 with creatinine of 0.03 hemoglobin was only 7.3 with a discharge hemoglobin of 8.1  after dropping to a hemoglobin of 6.6 He is back at home. Discharge antibiotic was Augmentin.[name] He was treated with IV Rocephin in the hospital. The patient was seen by palliative care I have not looked at this consult. They have been using dressings that they had left over at home I think most recently [facility]. According to his daughter the patient is eating fairly well 12/28/19-Patient is back at 2 weeks, the sacral wound is about the same, the right heel has healed and closed. We are using silver alginate to the sacral wound 6/18; we continue to follow the sacral wound. His heel wounds have closed. We have been using silver alginate to the sacrum 7/1; sacral wound. We have been using silver collagen with backing wet-to-dry. No open wounds on the heels. 7/15; some improvement in the dimensions of the sacral wound including the undermining area we have been using silver collagen with backing wet-to-dry dressings. We had some thoughts about trying a wound VAC. He has home health. 03/13/20-Sacral wound looks intact with healthy base, surrounding skin intact, no maceration, he did unfortunately develop to abrasion wounds on his first second and third toes dorsum of the Right foot this happened when his foot got trapped below the wheelchair Footguard 8/30; sacral wound has come down slightly especially the undermining. We have been using a wound VAC ooShe has 2 necrotic areas on the tip of the right first toe and the right second toe dorsally. Objective Constitutional Sitting or standing Blood Pressure is within target range for patient.. Pulse regular and within target range for  patient.[name] Respirations regular, non-labored and within target range.. Temperature is normal and within the target range for the patient.[name] Appears in no distress. Vitals Time Taken: 1:38 PM, Height: 71 in, Weight: 175 lbs, BMI: 24.4, Temperature: 98.1 F, Pulse: 106 bpm, Respiratory Rate: 18 breaths/min, Blood Pressure: 108/55 mmHg. General Notes: Wound exam -Right first and second toes thick eschar which did not look viable for healing. I used a #3 curette to remove eschar from both of these areas. On the first toe I was greeted by a relatively large amount of pus draining out. This was cultured ooThe wound on his lower back actually does not look too bad. It does come in in terms of dimensions as well as undermining but not depth. There is no exposed bone Integumentary (Hair, Skin) Wound #1 status is Open. Original cause of wound was Pressure Injury. The wound is located on the Sacrum. The wound measures 2.5cm length x 1cm width x 1cm depth; 1.963cm^2 area and 1.963cm^3 volume. There is Fat Layer (Subcutaneous Tissue) exposed. There is no tunneling noted, however, there is undermining starting at 12:00 and ending at 5:00 with a maximum distance of 1.5cm. There is a medium amount of serosanguineous drainage noted. The wound margin is well defined and not attached to the wound base. There is large (67-100%) red, pink granulation within the wound bed. There is a small (1-33%) amount of necrotic tissue within the wound bed including Adherent Slough. Wound #3 status is Open. Original cause of wound was Trauma. The wound is located on the Right T Great. The wound measures 0.7cm length x 1cm width x oe 0.1cm depth; 0.55cm^2 area and 0.055cm^3 volume. There is Fat Layer (Subcutaneous Tissue) exposed. There is no tunneling or undermining noted. There is a medium amount of serosanguineous drainage noted. The wound margin is flat and intact. There is small (1-33%) pink granulation within the wound bed. There  is  a large (67-100%) amount of necrotic tissue within the wound bed including Adherent Slough. Wound #4 status is Open. Original cause of wound was Trauma. The wound is located on the Right T Second. The wound measures 2.5cm length x 0.5cm oe width x 0.1cm depth; 0.982cm^2 area and 0.098cm^3 volume. There is Fat Layer (Subcutaneous Tissue) exposed. There is no tunneling or undermining noted. There is a small amount of serosanguineous drainage noted. The wound margin is flat and intact. There is large (67-100%) pink granulation within the wound bed. There is no necrotic tissue within the wound bed. Wound #5 status is Open. Original cause of wound was Trauma. The wound is located on the Right T Third. The wound measures 0.2cm length x 0.2cm width oe x 0.1cm depth; 0.031cm^2 area and 0.003cm^3 volume. There is Fat Layer (Subcutaneous Tissue) exposed. There is no tunneling or undermining noted. There is a small amount of serosanguineous drainage noted. The wound margin is flat and intact. There is large (67-100%) pink granulation within the wound bed. There is no necrotic tissue within the wound bed. Assessment Active Problems ICD-10 Pressure ulcer of sacral region, stage 4 Type 2 diabetes mellitus with foot ulcer Non-pressure chronic ulcer of other part of right foot with other specified severity Cellulitis of right toe Procedures Wound #3 Pre-procedure diagnosis of Wound #3 is a Trauma, Other located on the Right T Great .Severity of Tissue Pre Debridement is: Fat layer exposed. There was oe a Excisional Skin/Subcutaneous Tissue Debridement with a total area of 0.7 sq cm performed by Ricard Dillon., MD. With the following instrument(s): Curette to remove Viable and Non-Viable tissue/material. Material removed includes Eschar and Subcutaneous Tissue and. No specimens were taken. A time out was conducted at 13:58, prior to the start of the procedure. A Minimum amount of bleeding was  controlled with Pressure. The procedure was tolerated well with a pain level of 0 throughout and a pain level of 0 following the procedure. Post Debridement Measurements: 0.7cm length x 1cm width x 0.7cm depth; 0.385cm^3 volume. Character of Wound/Ulcer Post Debridement is improved. Severity of Tissue Post Debridement is: Fat layer exposed. Post procedure Diagnosis Wound #3: Same as Pre-Procedure Wound #4 Pre-procedure diagnosis of Wound #4 is a Trauma, Other located on the Right T Second .Severity of Tissue Pre Debridement is: Fat layer exposed. There oe was a Excisional Skin/Subcutaneous Tissue Debridement with a total area of 1.25 sq cm performed by Ricard Dillon., MD. With the following instrument(s): Curette to remove Viable and Non-Viable tissue/material. Material removed includes Eschar and Subcutaneous Tissue and. No specimens were taken. A time out was conducted at 13:58, prior to the start of the procedure. A Minimum amount of bleeding was controlled with Pressure. The procedure was tolerated well with a pain level of 0 throughout and a pain level of 0 following the procedure. Post Debridement Measurements: 2.5cm length x 0.5cm width x 0.1cm depth; 0.098cm^3 volume. Character of Wound/Ulcer Post Debridement is improved. Severity of Tissue Post Debridement is: Fat layer exposed. Post procedure Diagnosis Wound #4: Same as Pre-Procedure Plan Follow-up Appointments: Return Appointment in 2 weeks. - ***[facility] - ROOM 5*** Dressing Change Frequency: Change dressing three times week. - all wounds Skin Barriers/Peri-Wound Care: Wound #1 Sacrum: Skin Prep - to periwound Wound Cleansing: Clean wound with Wound Cleanser - or normal saline Primary Wound Dressing: Wound #1 Sacrum: Other: - wet-to-dry in today in clinic, home health to apply wound vac Wound #3 Right T Great: oe Calcium Alginate  with Silver Wound #4 Right T Second: oe Calcium Alginate with Silver Wound #5 Right T  Third: oe Calcium Alginate with Silver Secondary Dressing: Wound #1 Sacrum: Foam Border Wound #3 Right T Great: oe Kerlix/Rolled Gauze Dry Gauze Wound #4 Right T Second: oe Kerlix/Rolled Gauze Dry Gauze Wound #5 Right T Third: oe Kerlix/Rolled Gauze Dry Gauze Negative Presssure Wound Therapy: Wound #1 Sacrum: Medela Wound Vac continuously at 181mm/hg Black Foam Off-Loading: Low air-loss mattress (Group 2) Turn and reposition every 2 hours Other: - float heels with pillows under calves while in bed Home Health: [facility] skilled nursing for wound care. - [facility] ordered were: Anerobic culture - Right great toe The following medication(s) was prescribed: cephalexin oral 500 mg tablet 1 tablet oral tid for 7 days for wound infection starting 03/31/2020 1. I continued with the wound VAC to the sacrum 2. Silver alginate to the toes 3. Empiric cephalexin 500 3 times daily x7 days while we await for the culture of the right great toe. Electronic Signature(s) Signed: 03/31/2020 4:22:50 PM By: Linton Ham MD Entered By: Linton Ham on 03/31/2020 14:33:21 -------------------------------------------------------------------------------- SuperBill Details Patient Name: Date of Service: [name], [name] 03/31/2020 Medical Record Number: [id number] Patient Account Number: [id number] Date of Birth/Sex: Treating RN: July 03, 1922 (86 y.o. [name] Primary Care Provider: Hollace Kinnier Other Clinician: Referring Provider: Treating Provider/Extender: Frederick Peers in Treatment: 23 Diagnosis Coding ICD-10 Codes Code Description [phone] Pressure ulcer of sacral region, stage 4 E11.621 Type 2 diabetes mellitus with foot ulcer L97.518 Non-pressure chronic ulcer of other part of right foot with other specified severity L03.031 Cellulitis of right toe Facility Procedures CPT4 Code: [id number] Description: 62130 - DEB SUBQ TISSUE 20  SQ CM/< ICD-10 Diagnosis Description L97.518 Non-pressure chronic ulcer of other part of right foot with other specified sev L89.154 Pressure ulcer of sacral region, stage 4 Modifier: erity Quantity: 1 Physician Procedures : CPT4 Code Description Modifier [id number] 11042 - WC PHYS SUBQ TISS 20 SQ CM ICD-10 Diagnosis Description L97.518 Non-pressure chronic ulcer of other part of right foot with other specified severity L89.154 Pressure ulcer of sacral region, stage 4 Quantity: 1 Electronic Signature(s) Signed: 03/31/2020 4:22:50 PM By: Linton Ham MD Entered By: Linton Ham on 03/31/2020 14:33:40

## 2020-04-02 DIAGNOSIS — E1122 Type 2 diabetes mellitus with diabetic chronic kidney disease: Secondary | ICD-10-CM | POA: Diagnosis not present

## 2020-04-02 DIAGNOSIS — I130 Hypertensive heart and chronic kidney disease with heart failure and stage 1 through stage 4 chronic kidney disease, or unspecified chronic kidney disease: Secondary | ICD-10-CM | POA: Diagnosis not present

## 2020-04-02 DIAGNOSIS — N183 Chronic kidney disease, stage 3 unspecified: Secondary | ICD-10-CM | POA: Diagnosis not present

## 2020-04-02 DIAGNOSIS — I2699 Other pulmonary embolism without acute cor pulmonale: Secondary | ICD-10-CM | POA: Diagnosis not present

## 2020-04-02 DIAGNOSIS — F0391 Unspecified dementia with behavioral disturbance: Secondary | ICD-10-CM | POA: Diagnosis not present

## 2020-04-02 DIAGNOSIS — L89626 Pressure-induced deep tissue damage of left heel: Secondary | ICD-10-CM | POA: Diagnosis not present

## 2020-04-02 DIAGNOSIS — L89154 Pressure ulcer of sacral region, stage 4: Secondary | ICD-10-CM | POA: Diagnosis not present

## 2020-04-02 DIAGNOSIS — I5032 Chronic diastolic (congestive) heart failure: Secondary | ICD-10-CM | POA: Diagnosis not present

## 2020-04-02 DIAGNOSIS — I69351 Hemiplegia and hemiparesis following cerebral infarction affecting right dominant side: Secondary | ICD-10-CM | POA: Diagnosis not present

## 2020-04-02 NOTE — Progress Notes (Signed)
[name], [name] ([id number]) Visit Report for 03/31/2020 Arrival Information Details Patient Name: Date of Service: [name], [name] 03/31/2020 1:15 PM Medical Record Number: [id number] Patient Account Number: [id number] Date of Birth/Sex: Treating RN: 1922-07-20 (86 y.o. [name] Primary Care Kendelle Schweers: Hollace Kinnier Other Clinician: Referring Kyaira Trantham: Treating Chestina Komatsu/Extender: Frederick Peers in Treatment: 23 Visit Information History Since Last Visit Added or deleted any medications: No Patient Arrived: Wheel Chair Any new allergies or adverse reactions: No Arrival Time: 13:37 Had a fall or experienced change in No Accompanied By: daughter activities of daily living that may affect Transfer Assistance: Harrel Lemon Lift risk of falls: Patient Identification Verified: Yes Signs or symptoms of abuse/neglect since last visito No Secondary Verification Process Completed: Yes Hospitalized since last visit: No Patient Requires Transmission-Based Precautions: No Implantable device outside of the clinic excluding No Patient Has Alerts: No cellular tissue based products placed in the center since last visit: Has Dressing in Place as Prescribed: Yes Pain Present Now: No Electronic Signature(s) Signed: 04/02/2020 11:00:36 AM By: Sandre Kitty Entered By: Sandre Kitty on 03/31/2020 13:38:17 -------------------------------------------------------------------------------- Encounter Discharge Information Details Patient Name: Date of Service: [address] [name], [name] 03/31/2020 1:15 PM Medical Record Number: [id number] Patient Account Number: [id number] Date of Birth/Sex: Treating RN: 06-25-1922 (86 y.o. Marvis Repress Primary Care Erskin Zinda: Hollace Kinnier Other Clinician: Referring Lajoyce Tamura: Treating Rylann Munford/Extender: Frederick Peers in Treatment: 23 Encounter Discharge Information Items Post Procedure Vitals Discharge Condition:  Stable Temperature (F): 98.1 Ambulatory Status: Wheelchair Pulse (bpm): 106 Discharge Destination: Home Respiratory Rate (breaths/min): 18 Transportation: Other Blood Pressure (mmHg): 108/55 Accompanied By: daughter Schedule Follow-up Appointment: Yes Clinical Summary of Care: Patient Declined Electronic Signature(s) Signed: 03/31/2020 4:26:23 PM By: Kela Millin Entered By: Kela Millin on 03/31/2020 14:34:25 -------------------------------------------------------------------------------- Lower Extremity Assessment Details Patient Name: Date of Service: [name], [name] 03/31/2020 1:15 PM Medical Record Number: [id number] Patient Account Number: [id number] Date of Birth/Sex: Treating RN: Apr 23, 1922 (86 y.o. [name] Primary Care Yalexa Blust: Hollace Kinnier Other Clinician: Referring Shaylah Mcghie: Treating Illya Gienger/Extender: Frederick Peers in Treatment: 23 Vascular Assessment Pulses: Dorsalis Pedis Palpable: [Right:Yes] Electronic Signature(s) Signed: 03/31/2020 4:24:12 PM By: Levan Hurst RN, BSN Entered By: Levan Hurst on 03/31/2020 13:53:21 -------------------------------------------------------------------------------- Multi Wound Chart Details Patient Name: Date of Service: [address] [name], [name] 03/31/2020 1:15 PM Medical Record Number: [id number] Patient Account Number: [id number] Date of Birth/Sex: Treating RN: January 05, 1922 (86 y.o. [name] Primary Care Yamari Ventola: Hollace Kinnier Other Clinician: Referring [place]: Treating Elexus Barman/Extender: Frederick Peers in Treatment: 23 Vital Signs Height(in): 5 Pulse(bpm): 106 Weight(lbs): 175 Blood Pressure(mmHg): 108/55 Body Mass Index(BMI): 24 Temperature(F): 98.1 Respiratory Rate(breaths/min): 18 Photos: [1:No Photos Sacrum] [3:No Photos Right T Great oe] [4:No 64 Right T Second oe] Wound Location: [1:Pressure Injury] [3:Trauma] [4:Trauma] Wounding  Event: [1:Pressure Ulcer] [3:Trauma, Other] [4:Trauma, Other] Primary Etiology: [1:N/A] [3:Diabetic Wound/Ulcer of the Lower] [4:Diabetic Wound/Ulcer of the Lower] Secondary Etiology: [1:Glaucoma, Chronic Obstructive] [3:Extremity Glaucoma, Chronic Obstructive] [4:Extremity Glaucoma, Chronic Obstructive] Comorbid History: [1:Pulmonary Disease (COPD), Coronary Pulmonary Disease (COPD), Coronary Pulmonary Disease (COPD), Coronary Artery Disease, Hypertension, Type II Artery Disease, Hypertension, Type II Artery Disease, Hypertension, Type II Diabetes,  Osteoarthritis, Dementia 09/03/2019] [3:Diabetes, Osteoarthritis, Dementia 03/13/2020] [4:Diabetes, Osteoarthritis, Dementia 03/13/2020] Date Acquired: [1:23] [3:2] [4:2] Weeks of Treatment: [1:Open] [3:Open] [4:Open] Wound Status: [1:2.5x1x1] [3:0.7x1x0.1] [4:2.5x0.5x0.1] Measurements L x W x D (cm) [1:1.963] [3:0.55] [4:0.982] A (cm) : rea [1:1.963] [3:0.055] [4:0.098] Volume (cm) : [1:77.50%] [3:41.60%] [4:59.70%] %  Reduction in A rea: [1:86.80%] [3:41.50%] [4:79.90%] % Reduction in Volume: [1:12] Starting Position 1 (o'clock): [1:5] Ending Position 1 (o'clock): [1:1.5] Maximum Distance 1 (cm): [1:Yes] [3:No] [4:No] Undermining: [1:Category/Stage IV] [3:Full Thickness Without Exposed] [4:Full Thickness With Exposed Support] Classification: [1:Medium] [3:Support Structures Medium] [4:Structures Small] Exudate Amount: [1:Serosanguineous] [3:Serosanguineous] [4:Serosanguineous] Exudate Type: [1:red, brown] [3:red, brown] [4:red, brown] Exudate Color: [1:Well defined, not attached] [3:Flat and Intact] [4:Flat and Intact] Wound Margin: [1:Large (67-100%)] [3:Small (1-33%)] [4:Large (67-100%)] Granulation Amount: [1:Red, Pink] [3:Pink] [4:Pink] Granulation Quality: [1:Small (1-33%)] [3:Large (67-100%)] [4:None Present (0%)] Necrotic Amount: [1:Fat Layer (Subcutaneous Tissue): Yes Fat Layer (Subcutaneous Tissue): Yes Fat Layer (Subcutaneous Tissue):  Yes] Exposed Structures: [1:Fascia: No Tendon: No Muscle: No Joint: No Bone: No Medium (34-66%)] [3:Fascia: No Tendon: No Muscle: No Joint: No Bone: No Small (1-33%)] [4:Fascia: No Tendon: No Muscle: No Joint: No Bone: No Medium (34-66%)] Epithelialization: [1:N/A] [3:Debridement - Excisional] [4:Debridement - Excisional] Debridement: Pre-procedure Verification/Time Out N/A [3:13:58] [4:13:58] Taken: [1:N/A] [3:Necrotic/Eschar, Subcutaneous] [4:Necrotic/Eschar, Subcutaneous] Tissue Debrided: [1:N/A] [3:Skin/Subcutaneous Tissue] [4:Skin/Subcutaneous Tissue] Level: [1:N/A] [3:0.7] [4:1.25] Debridement A (sq cm): [1:rea N/A] [3:Curette] [4:Curette] Instrument: [1:N/A] [3:Minimum] [4:Minimum] Bleeding: [1:N/A] [3:Pressure] [4:Pressure] Hemostasis A chieved: [1:N/A] [3:0] [4:0] Procedural Pain: [1:N/A] [3:0] [4:0] Post Procedural Pain: [1:N/A] [3:Procedure was tolerated well] [4:Procedure was tolerated well] Debridement Treatment Response: [1:N/A] [3:0.7x1x0.7] [4:2.5x0.5x0.1] Post Debridement Measurements L x W x D (cm) [1:N/A] [3:0.385] [4:0.098] Post Debridement Volume: (cm) [1:N/A] [3:Debridement] [4:Debridement] Wound Number: 5 N/A N/A Photos: No Photos N/A N/A Right T Third oe N/A N/A Wound Location: Trauma N/A N/A Wounding Event: Trauma, Other N/A N/A Primary Etiology: Diabetic Wound/Ulcer of the Lower N/A N/A Secondary Etiology: Extremity Glaucoma, Chronic Obstructive N/A N/A Comorbid History: Pulmonary Disease (COPD), Coronary Artery Disease, Hypertension, Type II Diabetes, Osteoarthritis, Dementia 03/13/2020 N/A N/A Date A cquired: 2 N/A N/A Weeks of Treatment: Open N/A N/A Wound Status: 0.2x0.2x0.1 N/A N/A Measurements L x W x D (cm) 0.031 N/A N/A A (cm) : rea 0.003 N/A N/A Volume (cm) : 92.10% N/A N/A % Reduction in A rea: 92.30% N/A N/A % Reduction in Volume: No N/A N/A Undermining: Full Thickness Without Exposed N/A N/A Classification: Support  Structures Small N/A N/A Exudate A mount: Serosanguineous N/A N/A Exudate Type: red, brown N/A N/A Exudate Color: Flat and Intact N/A N/A Wound Margin: Large (67-100%) N/A N/A Granulation A mount: Pink N/A N/A Granulation Quality: None Present (0%) N/A N/A Necrotic A mount: Fat Layer (Subcutaneous Tissue): Yes N/A N/A Exposed Structures: Fascia: No Tendon: No Muscle: No Joint: No Bone: No Large (67-100%) N/A N/A Epithelialization: N/A N/A N/A Debridement: N/A N/A N/A Tissue Debrided: N/A N/A N/A Level: N/A N/A N/A Debridement A (sq cm): rea N/A N/A N/A Instrument: N/A N/A N/A Bleeding: N/A N/A N/A Hemostasis A chieved: N/A N/A N/A Procedural Pain: N/A N/A N/A Post Procedural Pain: Debridement Treatment Response: N/A N/A N/A Post Debridement Measurements L x N/A N/A N/A W x D (cm) N/A N/A N/A Post Debridement Volume: (cm) N/A N/A N/A Procedures Performed: Treatment Notes Electronic Signature(s) Signed: 03/31/2020 4:22:50 PM By: Linton Ham MD Signed: 03/31/2020 4:24:12 PM By: Levan Hurst RN, BSN Entered By: Linton Ham on 03/31/2020 14:26:14 -------------------------------------------------------------------------------- Multi-Disciplinary Care Plan Details Patient Name: Date of Service: [facility] [name], [name] 03/31/2020 1:15 PM Medical Record Number: [id number] Patient Account Number: [id number] Date of Birth/Sex: Treating RN: 02/05/1922 (86 y.o. [name] Primary Care Filomena Pokorney: Hollace Kinnier Other Clinician: Referring Melchor Kirchgessner: Treating Mali Eppard/Extender: Benita Gutter, Lindell Spar in  Treatment: 23 Active Inactive Wound/Skin Impairment Nursing Diagnoses: Impaired tissue integrity Knowledge deficit related to ulceration/compromised skin integrity Goals: Patient/caregiver will verbalize understanding of skin care regimen Date Initiated: 10/17/2019 Target Resolution Date: 04/11/2020 Goal Status: Active Ulcer/skin breakdown  will have a volume reduction of 30% by week 4 Date Initiated: 10/17/2019 Date Inactivated: 12/28/2019 Target Resolution Date: 12/28/2019 Goal Status: Unmet Unmet Reason: chronic wound Interventions: Assess patient/caregiver ability to obtain necessary supplies Assess patient/caregiver ability to perform ulcer/skin care regimen upon admission and as needed Assess ulceration(s) every visit Provide education on ulcer and skin care Treatment Activities: Skin care regimen initiated : 10/17/2019 Topical wound management initiated : 10/17/2019 Notes: Electronic Signature(s) Signed: 03/31/2020 4:24:12 PM By: Levan Hurst RN, BSN Entered By: Levan Hurst on 03/31/2020 13:47:14 -------------------------------------------------------------------------------- Pain Assessment Details Patient Name: Date of Service: [name], [name] 03/31/2020 1:15 PM Medical Record Number: [id number] Patient Account Number: [id number] Date of Birth/Sex: Treating RN: 1921/08/22 (86 y.o. [name] Primary Care Tiffney Haughton: Hollace Kinnier Other Clinician: Referring Sherrian Nunnelley: Treating Alberta Lenhard/Extender: Frederick Peers in Treatment: 23 Active Problems Location of Pain Severity and Description of Pain Patient Has Paino No Site Locations Pain Management and Medication Current Pain Management: Electronic Signature(s) Signed: 03/31/2020 4:24:12 PM By: Levan Hurst RN, BSN Signed: 04/02/2020 11:00:36 AM By: Sandre Kitty Entered By: Sandre Kitty on 03/31/2020 13:38:49 -------------------------------------------------------------------------------- Patient/Caregiver Education Details Patient Name: Date of Service: [facility] 8/30/2021andnbsp1:15 PM Medical Record Number: [id number] Patient Account Number: [id number] Date of Birth/Gender: Treating RN: 1921-11-03 (86 y.o. [name] Primary Care Physician: Hollace Kinnier Other Clinician: Referring Physician: Treating  Physician/Extender: Frederick Peers in Treatment: 23 Education Assessment Education Provided To: Patient Education Topics Provided Wound/Skin Impairment: Methods: Explain/Verbal Responses: State content correctly [employer]) Signed: 03/31/2020 4:24:12 PM By: Levan Hurst RN, BSN Entered By: Levan Hurst on 03/31/2020 13:47:25 -------------------------------------------------------------------------------- Wound Assessment Details Patient Name: Date of Service: [name], [name] 03/31/2020 1:15 PM Medical Record Number: [id number] Patient Account Number: [id number] Date of Birth/Sex: Treating RN: Mar 21, 1922 (86 y.o. [name] Primary Care Pragya Lofaso: Other Clinician: Hollace Kinnier Referring Nayely Dingus: Treating Twan Harkin/Extender: Frederick Peers in Treatment: 23 Wound Status Wound Number: 1 Primary Pressure Ulcer Etiology: Wound Location: Sacrum Wound Open Wounding Event: Pressure Injury Status: Date Acquired: 09/03/2019 Comorbid Glaucoma, Chronic Obstructive Pulmonary Disease (COPD), Weeks Of Treatment: 23 History: Coronary Artery Disease, Hypertension, Type II Diabetes, Clustered Wound: No Osteoarthritis, Dementia Photos Photo Uploaded By: Mikeal Hawthorne on 04/01/2020 11:31:01 Wound Measurements Length: (cm) 2.5 Width: (cm) 1 Depth: (cm) 1 Area: (cm) 1.963 Volume: (cm) 1.963 % Reduction in Area: 77.5% % Reduction in Volume: 86.8% Epithelialization: Medium (34-66%) Tunneling: No Undermining: Yes Starting Position (o'clock): 12 Ending Position (o'clock): 5 Maximum Distance: (cm) 1.5 Wound Description Classification: Category/Stage IV Wound Margin: Well defined, not attached Exudate Amount: Medium Exudate Type: Serosanguineous Exudate Color: red, brown Foul Odor After Cleansing: No Slough/Fibrino Yes Wound Bed Granulation Amount: Large (67-100%) Exposed Structure Granulation Quality: Red,  Pink Fascia Exposed: No Necrotic Amount: Small (1-33%) Fat Layer (Subcutaneous Tissue) Exposed: Yes Necrotic Quality: Adherent Slough Tendon Exposed: No Muscle Exposed: No Joint Exposed: No Bone Exposed: No Treatment Notes Wound #1 (Sacrum) 1. Cleanse With Wound Cleanser 2. Periwound Care Skin Prep 3. Primary Dressing Applied Other primary dressing (specifiy in notes) 4. Secondary Dressing Foam Border Dressing Notes saline moistened gauze packing Electronic Signature(s) Signed: 03/31/2020 4:24:12 PM By: Levan Hurst RN, BSN Entered By: Levan Hurst on  03/31/2020 13:54:01 -------------------------------------------------------------------------------- Wound Assessment Details Patient Name: Date of Service: [name], [name] 03/31/2020 1:15 PM Medical Record Number: [id number] Patient Account Number: [id number] Date of Birth/Sex: Treating RN: 1921-11-01 (86 y.o. [name] Primary Care Shir Bergman: Hollace Kinnier Other Clinician: Referring Andjela Wickes: Treating Viktor Philipp/Extender: Frederick Peers in Treatment: 23 Wound Status Wound Number: 3 Primary Trauma, Other Etiology: Wound Location: Right T Great oe Secondary Diabetic Wound/Ulcer of the Lower Extremity Wounding Event: Trauma Etiology: Date Acquired: 03/13/2020 Wound Open Weeks Of Treatment: 2 Status: Clustered Wound: No Comorbid Glaucoma, Chronic Obstructive Pulmonary Disease (COPD), History: Coronary Artery Disease, Hypertension, Type II Diabetes, Osteoarthritis, Dementia Photos Photo Uploaded By: Mikeal Hawthorne on 04/01/2020 11:30:22 Wound Measurements Length: (cm) 0.7 Width: (cm) 1 Depth: (cm) 0.1 Area: (cm) 0.55 Volume: (cm) 0.055 % Reduction in Area: 41.6% % Reduction in Volume: 41.5% Epithelialization: Small (1-33%) Tunneling: No Undermining: No Wound Description Classification: Full Thickness Without Exposed Support Structures Wound Margin: Flat and Intact Exudate  Amount: Medium Exudate Type: Serosanguineous Exudate Color: red, brown Foul Odor After Cleansing: No Slough/Fibrino No Wound Bed Granulation Amount: Small (1-33%) Exposed Structure Granulation Quality: Pink Fascia Exposed: No Necrotic Amount: Large (67-100%) Fat Layer (Subcutaneous Tissue) Exposed: Yes Necrotic Quality: Adherent Slough Tendon Exposed: No Muscle Exposed: No Joint Exposed: No Bone Exposed: No Treatment Notes Wound #3 (Right Toe Great) 1. Cleanse With Wound Cleanser 3. Primary Dressing Applied Calcium Alginate Ag 4. Secondary Dressing Dry Gauze Roll Gauze 5. Secured With Recruitment consultant) Signed: 03/31/2020 4:24:12 PM By: Levan Hurst RN, BSN Entered By: Levan Hurst on 03/31/2020 13:54:27 -------------------------------------------------------------------------------- Wound Assessment Details Patient Name: Date of Service: [name], [name] 03/31/2020 1:15 PM Medical Record Number: [id number] Patient Account Number: [id number] Date of Birth/Sex: Treating RN: 1922/07/02 (86 y.o. [name] Primary Care Mikaeel Petrow: Hollace Kinnier Other Clinician: Referring Reene Harlacher: Treating Keyshaun Exley/Extender: Frederick Peers in Treatment: 23 Wound Status Wound Number: 4 Primary Trauma, Other Etiology: Wound Location: Right T Second oe Secondary Diabetic Wound/Ulcer of the Lower Extremity Wounding Event: Trauma Etiology: Date Acquired: 03/13/2020 Wound Open Weeks Of Treatment: 2 Status: Clustered Wound: No Comorbid Glaucoma, Chronic Obstructive Pulmonary Disease (COPD), History: Coronary Artery Disease, Hypertension, Type II Diabetes, Osteoarthritis, Dementia Photos Photo Uploaded By: Mikeal Hawthorne on 04/01/2020 11:30:35 Wound Measurements Length: (cm) 2.5 Width: (cm) 0.5 Depth: (cm) 0.1 Area: (cm) 0.982 Volume: (cm) 0.098 % Reduction in Area: 59.7% % Reduction in Volume: 79.9% Epithelialization: Medium  (34-66%) Tunneling: No Undermining: No Wound Description Classification: Full Thickness With Exposed Support Structures Wound Margin: Flat and Intact Exudate Amount: Small Exudate Type: Serosanguineous Exudate Color: red, brown Foul Odor After Cleansing: No Slough/Fibrino No Wound Bed Granulation Amount: Large (67-100%) Exposed Structure Granulation Quality: Pink Fascia Exposed: No Necrotic Amount: None Present (0%) Fat Layer (Subcutaneous Tissue) Exposed: Yes Tendon Exposed: No Muscle Exposed: No Joint Exposed: No Bone Exposed: No Treatment Notes Wound #4 (Right Toe Second) 1. Cleanse With Wound Cleanser 3. Primary Dressing Applied Calcium Alginate Ag 4. Secondary Dressing Dry Gauze Roll Gauze 5. Secured With Recruitment consultant) Signed: 03/31/2020 4:24:12 PM By: Levan Hurst RN, BSN Entered By: Levan Hurst on 03/31/2020 13:54:47 -------------------------------------------------------------------------------- Wound Assessment Details Patient Name: Date of Service: [name], [name] 03/31/2020 1:15 PM Medical Record Number: [id number] Patient Account Number: [id number] Date of Birth/Sex: Treating RN: February 17, 1922 (86 y.o. [name] Primary Care Montgomery Favor: Hollace Kinnier Other Clinician: Referring Daquavion Catala: Treating Braden Cimo/Extender: Frederick Peers in Treatment: 23 Wound  Status Wound Number: 5 Primary Trauma, Other Etiology: Wound Location: Right T Third oe Secondary Diabetic Wound/Ulcer of the Lower Extremity Wounding Event: Trauma Etiology: Date Acquired: 03/13/2020 Wound Open Weeks Of Treatment: 2 Status: Clustered Wound: No Comorbid Glaucoma, Chronic Obstructive Pulmonary Disease (COPD), History: Coronary Artery Disease, Hypertension, Type II Diabetes, Osteoarthritis, Dementia Photos Photo Uploaded By: Mikeal Hawthorne on 04/01/2020 11:30:48 Wound Measurements Length: (cm) 0.2 Width: (cm) 0.2 Depth: (cm)  0.1 Area: (cm) 0.031 Volume: (cm) 0.003 % Reduction in Area: 92.1% % Reduction in Volume: 92.3% Epithelialization: Large (67-100%) Tunneling: No Undermining: No Wound Description Classification: Full Thickness Without Exposed Support Structures Wound Margin: Flat and Intact Exudate Amount: Small Exudate Type: Serosanguineous Exudate Color: red, brown Foul Odor After Cleansing: No Slough/Fibrino No Wound Bed Granulation Amount: Large (67-100%) Exposed Structure Granulation Quality: Pink Fascia Exposed: No Necrotic Amount: None Present (0%) Fat Layer (Subcutaneous Tissue) Exposed: Yes Tendon Exposed: No Muscle Exposed: No Joint Exposed: No Bone Exposed: No Treatment Notes Wound #5 (Right Toe Third) 1. Cleanse With Wound Cleanser 3. Primary Dressing Applied Calcium Alginate Ag 4. Secondary Dressing Dry Gauze Roll Gauze 5. Secured With Recruitment consultant) Signed: 03/31/2020 4:24:12 PM By: Levan Hurst RN, BSN Entered By: Levan Hurst on 03/31/2020 13:55:04 -------------------------------------------------------------------------------- Vitals Details Patient Name: Date of Service: [name], [name] 03/31/2020 1:15 PM Medical Record Number: [id number] Patient Account Number: [id number] Date of Birth/Sex: Treating RN: [birth year] (86 y.o. [name] Primary Care Ronneisha Jett: Hollace Kinnier Other Clinician: Referring Jessamyn Watterson: Treating Noralyn Karim/Extender: Frederick Peers in Treatment: 23 Vital Signs Time Taken: 13:38 Temperature (F): 98.1 Height (in): 71 Pulse (bpm): 106 Weight (lbs): 175 Respiratory Rate (breaths/min): 18 Body Mass Index (BMI): 24.4 Blood Pressure (mmHg): 108/55 Reference Range: 80 - 120 mg / dl Electronic Signature(s) Signed: 04/02/2020 11:00:36 AM By: Sandre Kitty Entered By: Sandre Kitty on 03/31/2020 13:38:33

## 2020-04-03 LAB — AEROBIC CULTURE W GRAM STAIN (SUPERFICIAL SPECIMEN)

## 2020-04-04 DIAGNOSIS — L89626 Pressure-induced deep tissue damage of left heel: Secondary | ICD-10-CM | POA: Diagnosis not present

## 2020-04-04 DIAGNOSIS — N183 Chronic kidney disease, stage 3 unspecified: Secondary | ICD-10-CM | POA: Diagnosis not present

## 2020-04-04 DIAGNOSIS — F0391 Unspecified dementia with behavioral disturbance: Secondary | ICD-10-CM | POA: Diagnosis not present

## 2020-04-04 DIAGNOSIS — I2699 Other pulmonary embolism without acute cor pulmonale: Secondary | ICD-10-CM | POA: Diagnosis not present

## 2020-04-04 DIAGNOSIS — I130 Hypertensive heart and chronic kidney disease with heart failure and stage 1 through stage 4 chronic kidney disease, or unspecified chronic kidney disease: Secondary | ICD-10-CM | POA: Diagnosis not present

## 2020-04-04 DIAGNOSIS — L89154 Pressure ulcer of sacral region, stage 4: Secondary | ICD-10-CM | POA: Diagnosis not present

## 2020-04-04 DIAGNOSIS — I69351 Hemiplegia and hemiparesis following cerebral infarction affecting right dominant side: Secondary | ICD-10-CM | POA: Diagnosis not present

## 2020-04-04 DIAGNOSIS — R2689 Other abnormalities of gait and mobility: Secondary | ICD-10-CM | POA: Diagnosis not present

## 2020-04-04 DIAGNOSIS — I5032 Chronic diastolic (congestive) heart failure: Secondary | ICD-10-CM | POA: Diagnosis not present

## 2020-04-04 DIAGNOSIS — E1122 Type 2 diabetes mellitus with diabetic chronic kidney disease: Secondary | ICD-10-CM | POA: Diagnosis not present

## 2020-04-04 DIAGNOSIS — L89153 Pressure ulcer of sacral region, stage 3: Secondary | ICD-10-CM | POA: Diagnosis not present

## 2020-04-07 DIAGNOSIS — E1122 Type 2 diabetes mellitus with diabetic chronic kidney disease: Secondary | ICD-10-CM | POA: Diagnosis not present

## 2020-04-07 DIAGNOSIS — I2699 Other pulmonary embolism without acute cor pulmonale: Secondary | ICD-10-CM | POA: Diagnosis not present

## 2020-04-07 DIAGNOSIS — L89154 Pressure ulcer of sacral region, stage 4: Secondary | ICD-10-CM | POA: Diagnosis not present

## 2020-04-07 DIAGNOSIS — I69351 Hemiplegia and hemiparesis following cerebral infarction affecting right dominant side: Secondary | ICD-10-CM | POA: Diagnosis not present

## 2020-04-07 DIAGNOSIS — I5032 Chronic diastolic (congestive) heart failure: Secondary | ICD-10-CM | POA: Diagnosis not present

## 2020-04-07 DIAGNOSIS — F0391 Unspecified dementia with behavioral disturbance: Secondary | ICD-10-CM | POA: Diagnosis not present

## 2020-04-07 DIAGNOSIS — L89626 Pressure-induced deep tissue damage of left heel: Secondary | ICD-10-CM | POA: Diagnosis not present

## 2020-04-07 DIAGNOSIS — I130 Hypertensive heart and chronic kidney disease with heart failure and stage 1 through stage 4 chronic kidney disease, or unspecified chronic kidney disease: Secondary | ICD-10-CM | POA: Diagnosis not present

## 2020-04-07 DIAGNOSIS — N183 Chronic kidney disease, stage 3 unspecified: Secondary | ICD-10-CM | POA: Diagnosis not present

## 2020-04-09 DIAGNOSIS — E1122 Type 2 diabetes mellitus with diabetic chronic kidney disease: Secondary | ICD-10-CM | POA: Diagnosis not present

## 2020-04-09 DIAGNOSIS — I130 Hypertensive heart and chronic kidney disease with heart failure and stage 1 through stage 4 chronic kidney disease, or unspecified chronic kidney disease: Secondary | ICD-10-CM | POA: Diagnosis not present

## 2020-04-09 DIAGNOSIS — L89154 Pressure ulcer of sacral region, stage 4: Secondary | ICD-10-CM | POA: Diagnosis not present

## 2020-04-09 DIAGNOSIS — L89626 Pressure-induced deep tissue damage of left heel: Secondary | ICD-10-CM | POA: Diagnosis not present

## 2020-04-09 DIAGNOSIS — N183 Chronic kidney disease, stage 3 unspecified: Secondary | ICD-10-CM | POA: Diagnosis not present

## 2020-04-09 DIAGNOSIS — I69351 Hemiplegia and hemiparesis following cerebral infarction affecting right dominant side: Secondary | ICD-10-CM | POA: Diagnosis not present

## 2020-04-09 DIAGNOSIS — F0391 Unspecified dementia with behavioral disturbance: Secondary | ICD-10-CM | POA: Diagnosis not present

## 2020-04-09 DIAGNOSIS — I2699 Other pulmonary embolism without acute cor pulmonale: Secondary | ICD-10-CM | POA: Diagnosis not present

## 2020-04-09 DIAGNOSIS — U071 COVID-19: Secondary | ICD-10-CM | POA: Diagnosis not present

## 2020-04-09 DIAGNOSIS — I5032 Chronic diastolic (congestive) heart failure: Secondary | ICD-10-CM | POA: Diagnosis not present

## 2020-04-11 DIAGNOSIS — L89626 Pressure-induced deep tissue damage of left heel: Secondary | ICD-10-CM | POA: Diagnosis not present

## 2020-04-11 DIAGNOSIS — L89154 Pressure ulcer of sacral region, stage 4: Secondary | ICD-10-CM | POA: Diagnosis not present

## 2020-04-11 DIAGNOSIS — I5032 Chronic diastolic (congestive) heart failure: Secondary | ICD-10-CM | POA: Diagnosis not present

## 2020-04-11 DIAGNOSIS — I69351 Hemiplegia and hemiparesis following cerebral infarction affecting right dominant side: Secondary | ICD-10-CM | POA: Diagnosis not present

## 2020-04-11 DIAGNOSIS — I130 Hypertensive heart and chronic kidney disease with heart failure and stage 1 through stage 4 chronic kidney disease, or unspecified chronic kidney disease: Secondary | ICD-10-CM | POA: Diagnosis not present

## 2020-04-11 DIAGNOSIS — F0391 Unspecified dementia with behavioral disturbance: Secondary | ICD-10-CM | POA: Diagnosis not present

## 2020-04-11 DIAGNOSIS — E1122 Type 2 diabetes mellitus with diabetic chronic kidney disease: Secondary | ICD-10-CM | POA: Diagnosis not present

## 2020-04-11 DIAGNOSIS — N183 Chronic kidney disease, stage 3 unspecified: Secondary | ICD-10-CM | POA: Diagnosis not present

## 2020-04-11 DIAGNOSIS — I2699 Other pulmonary embolism without acute cor pulmonale: Secondary | ICD-10-CM | POA: Diagnosis not present

## 2020-04-14 DIAGNOSIS — I130 Hypertensive heart and chronic kidney disease with heart failure and stage 1 through stage 4 chronic kidney disease, or unspecified chronic kidney disease: Secondary | ICD-10-CM | POA: Diagnosis not present

## 2020-04-14 DIAGNOSIS — N183 Chronic kidney disease, stage 3 unspecified: Secondary | ICD-10-CM | POA: Diagnosis not present

## 2020-04-14 DIAGNOSIS — F0391 Unspecified dementia with behavioral disturbance: Secondary | ICD-10-CM | POA: Diagnosis not present

## 2020-04-14 DIAGNOSIS — I5032 Chronic diastolic (congestive) heart failure: Secondary | ICD-10-CM | POA: Diagnosis not present

## 2020-04-14 DIAGNOSIS — I69351 Hemiplegia and hemiparesis following cerebral infarction affecting right dominant side: Secondary | ICD-10-CM | POA: Diagnosis not present

## 2020-04-14 DIAGNOSIS — I2699 Other pulmonary embolism without acute cor pulmonale: Secondary | ICD-10-CM | POA: Diagnosis not present

## 2020-04-14 DIAGNOSIS — L89626 Pressure-induced deep tissue damage of left heel: Secondary | ICD-10-CM | POA: Diagnosis not present

## 2020-04-14 DIAGNOSIS — L89154 Pressure ulcer of sacral region, stage 4: Secondary | ICD-10-CM | POA: Diagnosis not present

## 2020-04-14 DIAGNOSIS — E1122 Type 2 diabetes mellitus with diabetic chronic kidney disease: Secondary | ICD-10-CM | POA: Diagnosis not present

## 2020-04-16 DIAGNOSIS — E1122 Type 2 diabetes mellitus with diabetic chronic kidney disease: Secondary | ICD-10-CM | POA: Diagnosis not present

## 2020-04-16 DIAGNOSIS — I2699 Other pulmonary embolism without acute cor pulmonale: Secondary | ICD-10-CM | POA: Diagnosis not present

## 2020-04-16 DIAGNOSIS — L89626 Pressure-induced deep tissue damage of left heel: Secondary | ICD-10-CM | POA: Diagnosis not present

## 2020-04-16 DIAGNOSIS — N183 Chronic kidney disease, stage 3 unspecified: Secondary | ICD-10-CM | POA: Diagnosis not present

## 2020-04-16 DIAGNOSIS — I130 Hypertensive heart and chronic kidney disease with heart failure and stage 1 through stage 4 chronic kidney disease, or unspecified chronic kidney disease: Secondary | ICD-10-CM | POA: Diagnosis not present

## 2020-04-16 DIAGNOSIS — I69351 Hemiplegia and hemiparesis following cerebral infarction affecting right dominant side: Secondary | ICD-10-CM | POA: Diagnosis not present

## 2020-04-16 DIAGNOSIS — L89154 Pressure ulcer of sacral region, stage 4: Secondary | ICD-10-CM | POA: Diagnosis not present

## 2020-04-16 DIAGNOSIS — F0391 Unspecified dementia with behavioral disturbance: Secondary | ICD-10-CM | POA: Diagnosis not present

## 2020-04-16 DIAGNOSIS — I5032 Chronic diastolic (congestive) heart failure: Secondary | ICD-10-CM | POA: Diagnosis not present

## 2020-04-17 ENCOUNTER — Encounter (HOSPITAL_BASED_OUTPATIENT_CLINIC_OR_DEPARTMENT_OTHER): Payer: Medicare HMO | Admitting: Internal Medicine

## 2020-04-18 DIAGNOSIS — I2699 Other pulmonary embolism without acute cor pulmonale: Secondary | ICD-10-CM | POA: Diagnosis not present

## 2020-04-18 DIAGNOSIS — I69351 Hemiplegia and hemiparesis following cerebral infarction affecting right dominant side: Secondary | ICD-10-CM | POA: Diagnosis not present

## 2020-04-18 DIAGNOSIS — I5032 Chronic diastolic (congestive) heart failure: Secondary | ICD-10-CM | POA: Diagnosis not present

## 2020-04-18 DIAGNOSIS — I130 Hypertensive heart and chronic kidney disease with heart failure and stage 1 through stage 4 chronic kidney disease, or unspecified chronic kidney disease: Secondary | ICD-10-CM | POA: Diagnosis not present

## 2020-04-18 DIAGNOSIS — N183 Chronic kidney disease, stage 3 unspecified: Secondary | ICD-10-CM | POA: Diagnosis not present

## 2020-04-18 DIAGNOSIS — L89626 Pressure-induced deep tissue damage of left heel: Secondary | ICD-10-CM | POA: Diagnosis not present

## 2020-04-18 DIAGNOSIS — L89154 Pressure ulcer of sacral region, stage 4: Secondary | ICD-10-CM | POA: Diagnosis not present

## 2020-04-18 DIAGNOSIS — E1122 Type 2 diabetes mellitus with diabetic chronic kidney disease: Secondary | ICD-10-CM | POA: Diagnosis not present

## 2020-04-18 DIAGNOSIS — F0391 Unspecified dementia with behavioral disturbance: Secondary | ICD-10-CM | POA: Diagnosis not present

## 2020-04-19 DIAGNOSIS — L89302 Pressure ulcer of unspecified buttock, stage 2: Secondary | ICD-10-CM | POA: Diagnosis not present

## 2020-04-21 DIAGNOSIS — L89154 Pressure ulcer of sacral region, stage 4: Secondary | ICD-10-CM | POA: Diagnosis not present

## 2020-04-21 DIAGNOSIS — N183 Chronic kidney disease, stage 3 unspecified: Secondary | ICD-10-CM | POA: Diagnosis not present

## 2020-04-21 DIAGNOSIS — I5032 Chronic diastolic (congestive) heart failure: Secondary | ICD-10-CM | POA: Diagnosis not present

## 2020-04-21 DIAGNOSIS — L89626 Pressure-induced deep tissue damage of left heel: Secondary | ICD-10-CM | POA: Diagnosis not present

## 2020-04-21 DIAGNOSIS — I69351 Hemiplegia and hemiparesis following cerebral infarction affecting right dominant side: Secondary | ICD-10-CM | POA: Diagnosis not present

## 2020-04-21 DIAGNOSIS — I2699 Other pulmonary embolism without acute cor pulmonale: Secondary | ICD-10-CM | POA: Diagnosis not present

## 2020-04-21 DIAGNOSIS — F0391 Unspecified dementia with behavioral disturbance: Secondary | ICD-10-CM | POA: Diagnosis not present

## 2020-04-21 DIAGNOSIS — E1122 Type 2 diabetes mellitus with diabetic chronic kidney disease: Secondary | ICD-10-CM | POA: Diagnosis not present

## 2020-04-21 DIAGNOSIS — I130 Hypertensive heart and chronic kidney disease with heart failure and stage 1 through stage 4 chronic kidney disease, or unspecified chronic kidney disease: Secondary | ICD-10-CM | POA: Diagnosis not present

## 2020-04-22 DIAGNOSIS — L89302 Pressure ulcer of unspecified buttock, stage 2: Secondary | ICD-10-CM | POA: Diagnosis not present

## 2020-04-23 DIAGNOSIS — I69351 Hemiplegia and hemiparesis following cerebral infarction affecting right dominant side: Secondary | ICD-10-CM | POA: Diagnosis not present

## 2020-04-23 DIAGNOSIS — I130 Hypertensive heart and chronic kidney disease with heart failure and stage 1 through stage 4 chronic kidney disease, or unspecified chronic kidney disease: Secondary | ICD-10-CM | POA: Diagnosis not present

## 2020-04-23 DIAGNOSIS — L89626 Pressure-induced deep tissue damage of left heel: Secondary | ICD-10-CM | POA: Diagnosis not present

## 2020-04-23 DIAGNOSIS — N183 Chronic kidney disease, stage 3 unspecified: Secondary | ICD-10-CM | POA: Diagnosis not present

## 2020-04-23 DIAGNOSIS — I5032 Chronic diastolic (congestive) heart failure: Secondary | ICD-10-CM | POA: Diagnosis not present

## 2020-04-23 DIAGNOSIS — L89154 Pressure ulcer of sacral region, stage 4: Secondary | ICD-10-CM | POA: Diagnosis not present

## 2020-04-23 DIAGNOSIS — E1122 Type 2 diabetes mellitus with diabetic chronic kidney disease: Secondary | ICD-10-CM | POA: Diagnosis not present

## 2020-04-23 DIAGNOSIS — I2699 Other pulmonary embolism without acute cor pulmonale: Secondary | ICD-10-CM | POA: Diagnosis not present

## 2020-04-23 DIAGNOSIS — F0391 Unspecified dementia with behavioral disturbance: Secondary | ICD-10-CM | POA: Diagnosis not present

## 2020-04-25 DIAGNOSIS — I130 Hypertensive heart and chronic kidney disease with heart failure and stage 1 through stage 4 chronic kidney disease, or unspecified chronic kidney disease: Secondary | ICD-10-CM | POA: Diagnosis not present

## 2020-04-25 DIAGNOSIS — F0391 Unspecified dementia with behavioral disturbance: Secondary | ICD-10-CM | POA: Diagnosis not present

## 2020-04-25 DIAGNOSIS — I69351 Hemiplegia and hemiparesis following cerebral infarction affecting right dominant side: Secondary | ICD-10-CM | POA: Diagnosis not present

## 2020-04-25 DIAGNOSIS — L89154 Pressure ulcer of sacral region, stage 4: Secondary | ICD-10-CM | POA: Diagnosis not present

## 2020-04-25 DIAGNOSIS — E1122 Type 2 diabetes mellitus with diabetic chronic kidney disease: Secondary | ICD-10-CM | POA: Diagnosis not present

## 2020-04-25 DIAGNOSIS — I2699 Other pulmonary embolism without acute cor pulmonale: Secondary | ICD-10-CM | POA: Diagnosis not present

## 2020-04-25 DIAGNOSIS — N183 Chronic kidney disease, stage 3 unspecified: Secondary | ICD-10-CM | POA: Diagnosis not present

## 2020-04-25 DIAGNOSIS — I5032 Chronic diastolic (congestive) heart failure: Secondary | ICD-10-CM | POA: Diagnosis not present

## 2020-04-25 DIAGNOSIS — L89626 Pressure-induced deep tissue damage of left heel: Secondary | ICD-10-CM | POA: Diagnosis not present

## 2020-04-28 DIAGNOSIS — I69351 Hemiplegia and hemiparesis following cerebral infarction affecting right dominant side: Secondary | ICD-10-CM | POA: Diagnosis not present

## 2020-04-28 DIAGNOSIS — I130 Hypertensive heart and chronic kidney disease with heart failure and stage 1 through stage 4 chronic kidney disease, or unspecified chronic kidney disease: Secondary | ICD-10-CM | POA: Diagnosis not present

## 2020-04-28 DIAGNOSIS — E1122 Type 2 diabetes mellitus with diabetic chronic kidney disease: Secondary | ICD-10-CM | POA: Diagnosis not present

## 2020-04-28 DIAGNOSIS — I5032 Chronic diastolic (congestive) heart failure: Secondary | ICD-10-CM | POA: Diagnosis not present

## 2020-04-28 DIAGNOSIS — L89154 Pressure ulcer of sacral region, stage 4: Secondary | ICD-10-CM | POA: Diagnosis not present

## 2020-04-28 DIAGNOSIS — I2699 Other pulmonary embolism without acute cor pulmonale: Secondary | ICD-10-CM | POA: Diagnosis not present

## 2020-04-28 DIAGNOSIS — L89626 Pressure-induced deep tissue damage of left heel: Secondary | ICD-10-CM | POA: Diagnosis not present

## 2020-04-28 DIAGNOSIS — N183 Chronic kidney disease, stage 3 unspecified: Secondary | ICD-10-CM | POA: Diagnosis not present

## 2020-04-28 DIAGNOSIS — F0391 Unspecified dementia with behavioral disturbance: Secondary | ICD-10-CM | POA: Diagnosis not present

## 2020-04-30 DIAGNOSIS — I2699 Other pulmonary embolism without acute cor pulmonale: Secondary | ICD-10-CM | POA: Diagnosis not present

## 2020-04-30 DIAGNOSIS — I69351 Hemiplegia and hemiparesis following cerebral infarction affecting right dominant side: Secondary | ICD-10-CM | POA: Diagnosis not present

## 2020-04-30 DIAGNOSIS — F0391 Unspecified dementia with behavioral disturbance: Secondary | ICD-10-CM | POA: Diagnosis not present

## 2020-04-30 DIAGNOSIS — L89626 Pressure-induced deep tissue damage of left heel: Secondary | ICD-10-CM | POA: Diagnosis not present

## 2020-04-30 DIAGNOSIS — I5032 Chronic diastolic (congestive) heart failure: Secondary | ICD-10-CM | POA: Diagnosis not present

## 2020-04-30 DIAGNOSIS — N183 Chronic kidney disease, stage 3 unspecified: Secondary | ICD-10-CM | POA: Diagnosis not present

## 2020-04-30 DIAGNOSIS — L89154 Pressure ulcer of sacral region, stage 4: Secondary | ICD-10-CM | POA: Diagnosis not present

## 2020-04-30 DIAGNOSIS — E1122 Type 2 diabetes mellitus with diabetic chronic kidney disease: Secondary | ICD-10-CM | POA: Diagnosis not present

## 2020-04-30 DIAGNOSIS — I130 Hypertensive heart and chronic kidney disease with heart failure and stage 1 through stage 4 chronic kidney disease, or unspecified chronic kidney disease: Secondary | ICD-10-CM | POA: Diagnosis not present

## 2020-05-02 DIAGNOSIS — E1122 Type 2 diabetes mellitus with diabetic chronic kidney disease: Secondary | ICD-10-CM | POA: Diagnosis not present

## 2020-05-02 DIAGNOSIS — N183 Chronic kidney disease, stage 3 unspecified: Secondary | ICD-10-CM | POA: Diagnosis not present

## 2020-05-02 DIAGNOSIS — L89154 Pressure ulcer of sacral region, stage 4: Secondary | ICD-10-CM | POA: Diagnosis not present

## 2020-05-02 DIAGNOSIS — F0391 Unspecified dementia with behavioral disturbance: Secondary | ICD-10-CM | POA: Diagnosis not present

## 2020-05-02 DIAGNOSIS — I69351 Hemiplegia and hemiparesis following cerebral infarction affecting right dominant side: Secondary | ICD-10-CM | POA: Diagnosis not present

## 2020-05-02 DIAGNOSIS — Z7901 Long term (current) use of anticoagulants: Secondary | ICD-10-CM | POA: Diagnosis not present

## 2020-05-02 DIAGNOSIS — I130 Hypertensive heart and chronic kidney disease with heart failure and stage 1 through stage 4 chronic kidney disease, or unspecified chronic kidney disease: Secondary | ICD-10-CM | POA: Diagnosis not present

## 2020-05-02 DIAGNOSIS — I2699 Other pulmonary embolism without acute cor pulmonale: Secondary | ICD-10-CM | POA: Diagnosis not present

## 2020-05-02 DIAGNOSIS — I5032 Chronic diastolic (congestive) heart failure: Secondary | ICD-10-CM | POA: Diagnosis not present

## 2020-05-04 DIAGNOSIS — R2689 Other abnormalities of gait and mobility: Secondary | ICD-10-CM | POA: Diagnosis not present

## 2020-05-04 DIAGNOSIS — L89153 Pressure ulcer of sacral region, stage 3: Secondary | ICD-10-CM | POA: Diagnosis not present

## 2020-05-05 DIAGNOSIS — F0391 Unspecified dementia with behavioral disturbance: Secondary | ICD-10-CM | POA: Diagnosis not present

## 2020-05-05 DIAGNOSIS — I130 Hypertensive heart and chronic kidney disease with heart failure and stage 1 through stage 4 chronic kidney disease, or unspecified chronic kidney disease: Secondary | ICD-10-CM | POA: Diagnosis not present

## 2020-05-05 DIAGNOSIS — E1122 Type 2 diabetes mellitus with diabetic chronic kidney disease: Secondary | ICD-10-CM | POA: Diagnosis not present

## 2020-05-05 DIAGNOSIS — I5032 Chronic diastolic (congestive) heart failure: Secondary | ICD-10-CM | POA: Diagnosis not present

## 2020-05-05 DIAGNOSIS — Z7901 Long term (current) use of anticoagulants: Secondary | ICD-10-CM | POA: Diagnosis not present

## 2020-05-05 DIAGNOSIS — L89154 Pressure ulcer of sacral region, stage 4: Secondary | ICD-10-CM | POA: Diagnosis not present

## 2020-05-05 DIAGNOSIS — I69351 Hemiplegia and hemiparesis following cerebral infarction affecting right dominant side: Secondary | ICD-10-CM | POA: Diagnosis not present

## 2020-05-05 DIAGNOSIS — N183 Chronic kidney disease, stage 3 unspecified: Secondary | ICD-10-CM | POA: Diagnosis not present

## 2020-05-05 DIAGNOSIS — I2699 Other pulmonary embolism without acute cor pulmonale: Secondary | ICD-10-CM | POA: Diagnosis not present

## 2020-05-06 ENCOUNTER — Encounter (HOSPITAL_BASED_OUTPATIENT_CLINIC_OR_DEPARTMENT_OTHER): Payer: Medicare HMO | Admitting: Internal Medicine

## 2020-05-07 DIAGNOSIS — I5032 Chronic diastolic (congestive) heart failure: Secondary | ICD-10-CM | POA: Diagnosis not present

## 2020-05-07 DIAGNOSIS — N183 Chronic kidney disease, stage 3 unspecified: Secondary | ICD-10-CM | POA: Diagnosis not present

## 2020-05-07 DIAGNOSIS — I69351 Hemiplegia and hemiparesis following cerebral infarction affecting right dominant side: Secondary | ICD-10-CM | POA: Diagnosis not present

## 2020-05-07 DIAGNOSIS — I2699 Other pulmonary embolism without acute cor pulmonale: Secondary | ICD-10-CM | POA: Diagnosis not present

## 2020-05-07 DIAGNOSIS — L89154 Pressure ulcer of sacral region, stage 4: Secondary | ICD-10-CM | POA: Diagnosis not present

## 2020-05-07 DIAGNOSIS — Z7901 Long term (current) use of anticoagulants: Secondary | ICD-10-CM | POA: Diagnosis not present

## 2020-05-07 DIAGNOSIS — I130 Hypertensive heart and chronic kidney disease with heart failure and stage 1 through stage 4 chronic kidney disease, or unspecified chronic kidney disease: Secondary | ICD-10-CM | POA: Diagnosis not present

## 2020-05-07 DIAGNOSIS — F0391 Unspecified dementia with behavioral disturbance: Secondary | ICD-10-CM | POA: Diagnosis not present

## 2020-05-07 DIAGNOSIS — E1122 Type 2 diabetes mellitus with diabetic chronic kidney disease: Secondary | ICD-10-CM | POA: Diagnosis not present

## 2020-05-09 ENCOUNTER — Other Ambulatory Visit: Payer: Self-pay

## 2020-05-09 ENCOUNTER — Other Ambulatory Visit (HOSPITAL_COMMUNITY)
Admission: RE | Admit: 2020-05-09 | Discharge: 2020-05-09 | Disposition: A | Discharge status: Home / Self Care | Payer: Medicare HMO | Source: Other Acute Inpatient Hospital | Attending: Internal Medicine | Admitting: Internal Medicine

## 2020-05-09 ENCOUNTER — Encounter (HOSPITAL_BASED_OUTPATIENT_CLINIC_OR_DEPARTMENT_OTHER): Payer: Medicare HMO | Attending: Internal Medicine | Admitting: Internal Medicine

## 2020-05-09 DIAGNOSIS — E11621 Type 2 diabetes mellitus with foot ulcer: Secondary | ICD-10-CM | POA: Diagnosis not present

## 2020-05-09 DIAGNOSIS — M8618 Other acute osteomyelitis, other site: Secondary | ICD-10-CM | POA: Diagnosis not present

## 2020-05-09 DIAGNOSIS — B952 Enterococcus as the cause of diseases classified elsewhere: Secondary | ICD-10-CM | POA: Insufficient documentation

## 2020-05-09 DIAGNOSIS — L97518 Non-pressure chronic ulcer of other part of right foot with other specified severity: Secondary | ICD-10-CM | POA: Insufficient documentation

## 2020-05-09 DIAGNOSIS — M8788 Other osteonecrosis, other site: Secondary | ICD-10-CM | POA: Diagnosis not present

## 2020-05-09 DIAGNOSIS — L89154 Pressure ulcer of sacral region, stage 4: Secondary | ICD-10-CM | POA: Diagnosis not present

## 2020-05-09 DIAGNOSIS — U071 COVID-19: Secondary | ICD-10-CM | POA: Diagnosis not present

## 2020-05-09 DIAGNOSIS — L98499 Non-pressure chronic ulcer of skin of other sites with unspecified severity: Secondary | ICD-10-CM | POA: Diagnosis present

## 2020-05-12 DIAGNOSIS — F0391 Unspecified dementia with behavioral disturbance: Secondary | ICD-10-CM | POA: Diagnosis not present

## 2020-05-12 DIAGNOSIS — I130 Hypertensive heart and chronic kidney disease with heart failure and stage 1 through stage 4 chronic kidney disease, or unspecified chronic kidney disease: Secondary | ICD-10-CM | POA: Diagnosis not present

## 2020-05-12 DIAGNOSIS — L89154 Pressure ulcer of sacral region, stage 4: Secondary | ICD-10-CM | POA: Diagnosis not present

## 2020-05-12 DIAGNOSIS — I5032 Chronic diastolic (congestive) heart failure: Secondary | ICD-10-CM | POA: Diagnosis not present

## 2020-05-12 DIAGNOSIS — I2699 Other pulmonary embolism without acute cor pulmonale: Secondary | ICD-10-CM | POA: Diagnosis not present

## 2020-05-12 DIAGNOSIS — I69351 Hemiplegia and hemiparesis following cerebral infarction affecting right dominant side: Secondary | ICD-10-CM | POA: Diagnosis not present

## 2020-05-12 DIAGNOSIS — Z7901 Long term (current) use of anticoagulants: Secondary | ICD-10-CM | POA: Diagnosis not present

## 2020-05-12 DIAGNOSIS — N183 Chronic kidney disease, stage 3 unspecified: Secondary | ICD-10-CM | POA: Diagnosis not present

## 2020-05-12 DIAGNOSIS — E1122 Type 2 diabetes mellitus with diabetic chronic kidney disease: Secondary | ICD-10-CM | POA: Diagnosis not present

## 2020-05-12 NOTE — Progress Notes (Signed)
[name], [name] ([id number]) Visit Report for 05/09/2020 Arrival Information Details Patient Name: Date of Service: [name], [name] 05/09/2020 1:15 PM Medical Record Number: [id number] Patient Account Number: [id number] Date of Birth/Sex: Treating RN: [birth year] (86 y.o. [name] Primary Care Kateland Leisinger: Hollace Kinnier Other Clinician: Referring Nikki Glanzer: Treating Devyon Keator/Extender: Frederick Peers in Treatment: 29 Visit Information History Since Last Visit Added or deleted any medications: No Patient Arrived: Wheel Chair Any new allergies or adverse reactions: No Arrival Time: 13:24 Had a fall or experienced change in No Accompanied By: dgt activities of daily living that may affect Transfer Assistance: Harrel Lemon Lift risk of falls: Patient Identification Verified: Yes Signs or symptoms of abuse/neglect since last visito No Secondary Verification Process Completed: Yes Hospitalized since last visit: No Patient Requires Transmission-Based Precautions: No Implantable device outside of the clinic excluding No Patient Has Alerts: No cellular tissue based products placed in the center since last visit: Has Dressing in Place as Prescribed: Yes Pain Present Now: No Electronic Signature(s) Signed: 05/09/2020 5:42:53 PM By: Baruch Gouty RN, BSN Entered By: Baruch Gouty on 05/09/2020 13:24:43 -------------------------------------------------------------------------------- Encounter Discharge Information Details Patient Name: Date of Service: [address] [name], [name] 05/09/2020 1:15 PM Medical Record Number: [id number] Patient Account Number: [id number] Date of Birth/Sex: Treating RN: May 03, 1922 (86 y.o. [name] Primary Care Lyncoln Ledgerwood: Hollace Kinnier Other Clinician: Referring Banita Lehn: Treating Chandan Fly/Extender: Frederick Peers in Treatment: 29 Encounter Discharge Information Items Discharge Condition: Stable Ambulatory Status:  Wheelchair Discharge Destination: Home Transportation: Private Auto Accompanied By: daughter Schedule Follow-up Appointment: Yes Clinical Summary of Care: Patient Declined Electronic Signature(s) Signed: 05/12/2020 2:05:08 PM By: Mikeal Hawthorne EMT/HBOT/SD Entered By: Mikeal Hawthorne on 05/09/2020 14:35:23 -------------------------------------------------------------------------------- Lower Extremity Assessment Details Patient Name: Date of Service: [facility] [name], [name] 05/09/2020 1:15 PM Medical Record Number: [id number] Patient Account Number: [id number] Date of Birth/Sex: Treating RN: 09/29/1921 (86 y.o. [name] Primary Care Birch Farino: Hollace Kinnier Other Clinician: Referring Shivani Barrantes: Treating Jewell Haught/Extender: Frederick Peers in Treatment: 29 Edema Assessment Assessed: [Left: No] [Right: No] Edema: [Left: N] [Right: o] Vascular Assessment Pulses: Dorsalis Pedis Palpable: [Right:Yes] Electronic Signature(s) Signed: 05/09/2020 5:42:53 PM By: Baruch Gouty RN, BSN Entered By: Baruch Gouty on 05/09/2020 13:45:09 -------------------------------------------------------------------------------- Multi Wound Chart Details Patient Name: Date of Service: [address] [name], [name] 05/09/2020 1:15 PM Medical Record Number: [id number] Patient Account Number: [id number] Date of Birth/Sex: Treating RN: 07/20/1922 (86 y.o. [name] Primary Care Kevia Zaucha: Hollace Kinnier Other Clinician: Referring Yamari Ventola: Treating Qasim Diveley/Extender: Frederick Peers in Treatment: 29 Vital Signs Height(in): 25 Pulse(bpm): 45 Weight(lbs): 175 Blood Pressure(mmHg): 115/63 Body Mass Index(BMI): 24 Temperature(F): 96.8 Respiratory Rate(breaths/min): 18 Photos: [1:No Photos Sacrum] [3:No Photos Right T Great oe] [4:No 23 Right T Second oe] Wound Location: [1:Pressure Injury] [3:Trauma] [4:Trauma] Wounding Event: [1:Pressure Ulcer] [3:Trauma,  Other] [4:Trauma, Other] Primary Etiology: [1:N/A] [3:Diabetic Wound/Ulcer of the Lower] [4:Diabetic Wound/Ulcer of the Lower] Secondary Etiology: [1:Glaucoma, Chronic Obstructive] [3:Extremity Glaucoma, Chronic Obstructive] [4:Extremity Glaucoma, Chronic Obstructive] Comorbid History: [1:Pulmonary Disease (COPD), Coronary Pulmonary Disease (COPD), Coronary Pulmonary Disease (COPD), Coronary Artery Disease, Hypertension, Type II Artery Disease, Hypertension, Type II Artery Disease, Hypertension, Type II Diabetes,  Osteoarthritis, Dementia 09/03/2019] [3:Diabetes, Osteoarthritis, Dementia 03/13/2020] [4:Diabetes, Osteoarthritis, Dementia 03/13/2020] Date Acquired: [1:29] [3:8] [4:8] Weeks of Treatment: [1:Open] [3:Healed - Epithelialized] [4:Open] Wound Status: [1:3.4x3.6x1.8] [3:0x0x0] [4:0.5x0.5x0.1] Measurements L x W x D (cm) [1:9.613] [3:0] [4:0.196] A (cm) : rea [1:17.304] [3:0] [4:0.02] Volume (cm) : [1:-10.30%] [3:100.00%] [4:92.00%] %  Reduction in A rea: [1:-16.80%] [3:100.00%] [4:95.90%] % Reduction in Volume: [1:12] Starting Position 1 (o'clock): [1:5] Ending Position 1 (o'clock): [1:3.6] Maximum Distance 1 (cm): [1:Yes] [3:No] [4:No] Undermining: [1:Category/Stage IV] [3:Full Thickness Without Exposed] [4:Full Thickness With Exposed Support] Classification: [1:Medium] [3:Support Structures None Present] [4:Structures Small] Exudate Amount: [1:Serosanguineous] [3:N/A] [4:Serosanguineous] Exudate Type: [1:red, brown] [3:N/A] [4:red, brown] Exudate Color: [1:Well defined, not attached] [3:Flat and Intact] [4:Flat and Intact] Wound Margin: [1:Medium (34-66%)] [3:None Present (0%)] [4:Small (1-33%)] Granulation Amount: [1:Red] [3:N/A] [4:Red, Pink] Granulation Quality: [1:Medium (34-66%)] [3:None Present (0%)] [4:None Present (0%)] Necrotic Amount: [1:Fat Layer (Subcutaneous Tissue): Yes Fascia: No] [4:Fat Layer (Subcutaneous Tissue): Yes] Exposed Structures: [1:Bone: Yes Fascia: No  Tendon: No Muscle: No Joint: No None] [3:Fat Layer (Subcutaneous Tissue): No Tendon: No Muscle: No Joint: No Bone: No Large (67-100%)] [4:Fascia: No Tendon: No Muscle: No Joint: No Bone: No Large (67-100%)] Wound Number: 5 N/A N/A Photos: No Photos N/A N/A Right T Third oe N/A N/A Wound Location: Trauma N/A N/A Wounding Event: Trauma, Other N/A N/A Primary Etiology: Diabetic Wound/Ulcer of the Lower N/A N/A Secondary Etiology: Extremity Glaucoma, Chronic Obstructive N/A N/A Comorbid History: Pulmonary Disease (COPD), Coronary Artery Disease, Hypertension, Type II Diabetes, Osteoarthritis, Dementia 03/13/2020 N/A N/A Date Acquired: 8 N/A N/A Weeks of Treatment: Open N/A N/A Wound Status: 0.4x0.3x0.1 N/A N/A Measurements L x W x D (cm) 0.094 N/A N/A A (cm) : rea 0.009 N/A N/A Volume (cm) : 76.10% N/A N/A % Reduction in Area: 76.90% N/A N/A % Reduction in Volume: No N/A N/A Undermining: Full Thickness Without Exposed N/A N/A Classification: Support Structures None Present N/A N/A Exudate Amount: N/A N/A N/A Exudate Type: N/A N/A N/A Exudate Color: Flat and Intact N/A N/A Wound Margin: Small (1-33%) N/A N/A Granulation Amount: Red N/A N/A Granulation Quality: None Present (0%) N/A N/A Necrotic Amount: Fat Layer (Subcutaneous Tissue): Yes N/A N/A Exposed Structures: Fascia: No Tendon: No Muscle: No Joint: No Bone: No Large (67-100%) N/A N/A Epithelialization: Treatment Notes Wound #1 (Sacrum) 1. Cleanse With Wound Cleanser 2. Periwound Care Skin Prep 3. Primary Dressing Applied Calcium Alginate Ag 4. Secondary Dressing ABD Pad Dry Gauze 5. Secured With Medipore tape Wound #4 (Right Toe Second) 1. Cleanse With Wound Cleanser 3. Primary Dressing Applied Calcium Alginate Ag 4. Secondary Dressing Roll Gauze 5. Secured With Tape Notes netting Wound #5 (Right Toe Third) 1. Cleanse With Wound Cleanser 3. Primary Dressing Applied Calcium  Alginate Ag 4. Secondary Dressing Roll Gauze 5. Secured With Tape Notes Horticulturist, commercial) Signed: 05/09/2020 5:35:06 PM By: Kela Millin Signed: 05/12/2020 5:03:49 PM By: Linton Ham MD Entered By: Linton Ham on 05/09/2020 14:37:57 -------------------------------------------------------------------------------- Multi-Disciplinary Care Plan Details Patient Name: Date of Service: [facility] JONAVON, TRIEU 05/09/2020 1:15 PM Medical Record Number: [id number] Patient Account Number: [id number] Date of Birth/Sex: Treating RN: 1922/05/07 (85 y.o. [name] Primary Care Breahna Boylen: Hollace Kinnier Other Clinician: Referring Lillionna Nabi: Treating Kimbrely Buckel/Extender: Frederick Peers in Treatment: 29 Active Inactive Wound/Skin Impairment Nursing Diagnoses: Impaired tissue integrity Knowledge deficit related to ulceration/compromised skin integrity Goals: Patient/caregiver will verbalize understanding of skin care regimen Date Initiated: 10/17/2019 Target Resolution Date: 04/11/2020 Goal Status: Active Ulcer/skin breakdown will have a volume reduction of 30% by week 4 Date Initiated: 10/17/2019 Date Inactivated: 12/28/2019 Target Resolution Date: 12/28/2019 Goal Status: Unmet Unmet Reason: chronic wound Interventions: Assess patient/caregiver ability to obtain necessary supplies Assess patient/caregiver ability to perform ulcer/skin care regimen upon admission and as needed Assess ulceration(s) every  visit Provide education on ulcer and skin care Treatment Activities: Skin care regimen initiated : 10/17/2019 Topical wound management initiated : 10/17/2019 Notes: Electronic Signature(s) Signed: 05/09/2020 5:35:06 PM By: Kela Millin Entered By: Kela Millin on 05/09/2020 13:08:28 -------------------------------------------------------------------------------- Pain Assessment Details Patient Name: Date of Service: [name], [name]  05/09/2020 1:15 PM Medical Record Number: [id number] Patient Account Number: [id number] Date of Birth/Sex: Treating RN: 07/24/1922 (86 y.o. [name] Primary Care Isiaha Greenup: Hollace Kinnier Other Clinician: Referring Rusti Arizmendi: Treating Sehaj Mcenroe/Extender: Frederick Peers in Treatment: 29 Active Problems Location of Pain Severity and Description of Pain Patient Has Paino Yes Site Locations Pain Location: Pain in Ulcers With Dressing Change: Yes Duration of the Pain. Constant / Intermittento Intermittent Rate the pain. Current Pain Level: 2 Character of Pain Describe the Pain: Tender Pain Management and Medication Current Pain Management: Electronic Signature(s) Signed: 05/09/2020 5:42:53 PM By: Baruch Gouty RN, BSN Entered By: Baruch Gouty on 05/09/2020 13:44:43 -------------------------------------------------------------------------------- Patient/Caregiver Education Details Patient Name: Date of Service: [facility] 10/8/2021andnbsp1:15 PM Medical Record Number: [id number] Patient Account Number: [id number] Date of Birth/Gender: Treating RN: [birth year] (86 y.o. [name] Primary Care Physician: Hollace Kinnier Other Clinician: Referring Physician: Treating Physician/Extender: Frederick Peers in Treatment: 29 Education Assessment Education Provided To: Caregiver Education Topics Provided Wound/Skin Impairment: Handouts: Caring for Your Ulcer Methods: Explain/Verbal Responses: State content correctly Electronic Signature(s) Signed: 05/09/2020 5:35:06 PM By: Kela Millin Entered By: Kela Millin on 05/09/2020 13:09:52 -------------------------------------------------------------------------------- Wound Assessment Details Patient Name: Date of Service: [name], [name] 05/09/2020 1:15 PM Medical Record Number: [id number] Patient Account Number: [id number] Date of Birth/Sex: Treating  RN: [birth year] (86 y.o. [name] Primary Care Payzlee [name]: Hollace Kinnier Other Clinician: Referring Vineeth Fell: Treating Arleny Kruger/Extender: Frederick Peers in Treatment: 29 Wound Status Wound Number: 1 Primary Pressure Ulcer Etiology: Wound Location: Sacrum Wound Open Wounding Event: Pressure Injury Status: Date Acquired: 09/03/2019 Comorbid Glaucoma, Chronic Obstructive Pulmonary Disease (COPD), Weeks Of Treatment: 29 History: Coronary Artery Disease, Hypertension, Type II Diabetes, Clustered Wound: No Osteoarthritis, Dementia Photos Photo Uploaded By: Mikeal Hawthorne on 05/12/2020 14:04:03 Wound Measurements Length: (cm) 3.4 Width: (cm) 3.6 Depth: (cm) 1.8 Area: (cm) 9.613 Volume: (cm) 17.304 % Reduction in Area: -10.3% % Reduction in Volume: -16.8% Epithelialization: None Tunneling: No Undermining: Yes Starting Position (o'clock): 12 Ending Position (o'clock): 5 Maximum Distance: (cm) 3.6 Wound Description Classification: Category/Stage IV Wound Margin: Well defined, not attached Exudate Amount: Medium Exudate Type: Serosanguineous Exudate Color: red, brown Foul Odor After Cleansing: No Slough/Fibrino Yes Wound Bed Granulation Amount: Medium (34-66%) Exposed Structure Granulation Quality: Red Fascia Exposed: No Necrotic Amount: Medium (34-66%) Fat Layer (Subcutaneous Tissue) Exposed: Yes Necrotic Quality: Adherent Slough Tendon Exposed: No Muscle Exposed: No Joint Exposed: No Bone Exposed: Yes Treatment Notes Wound #1 (Sacrum) 1. Cleanse With Wound Cleanser 2. Periwound Care Skin Prep 3. Primary Dressing Applied Calcium Alginate Ag 4. Secondary Dressing ABD Pad Dry Gauze 5. Secured With [employer]) Signed: 05/09/2020 5:42:53 PM By: Baruch Gouty RN, BSN Entered By: Baruch Gouty on 05/09/2020 13:48:08 -------------------------------------------------------------------------------- Wound  Assessment Details Patient Name: Date of Service: [facility] [name], [name] 05/09/2020 1:15 PM Medical Record Number: [id number] Patient Account Number: [id number] Date of Birth/Sex: Treating RN: Aug 02, 1922 (86 y.o. [name] Primary Care Trexton Escamilla: Hollace Kinnier Other Clinician: Referring Jason Hauge: Treating Kahlin Mark/Extender: Frederick Peers in Treatment: 29 Wound Status Wound Number: 3 Primary Trauma, Other Etiology: Wound Location: Right T  Great oe Secondary Diabetic Wound/Ulcer of the Lower Extremity Wounding Event: Trauma Etiology: Date Acquired: 03/13/2020 Wound Healed - Epithelialized Weeks Of Treatment: 8 Status: Clustered Wound: No Comorbid Glaucoma, Chronic Obstructive Pulmonary Disease (COPD), History: Coronary Artery Disease, Hypertension, Type II Diabetes, Osteoarthritis, Dementia Photos Photo Uploaded By: Mikeal Hawthorne on 05/12/2020 14:04:03 Wound Measurements Length: (cm) Width: (cm) Depth: (cm) Area: (cm) Volume: (cm) 0 % Reduction in Area: 100% 0 % Reduction in Volume: 100% 0 Epithelialization: Large (67-100%) 0 Tunneling: No 0 Undermining: No Wound Description Classification: Full Thickness Without Exposed Support Structures Wound Margin: Flat and Intact Exudate Amount: None Present Foul Odor After Cleansing: No Slough/Fibrino No Wound Bed Granulation Amount: None Present (0%) Exposed Structure Necrotic Amount: None Present (0%) Fascia Exposed: No Fat Layer (Subcutaneous Tissue) Exposed: No Tendon Exposed: No Muscle Exposed: No Joint Exposed: No Bone Exposed: No Electronic Signature(s) Signed: 05/09/2020 5:42:53 PM By: Baruch Gouty RN, BSN Entered By: Baruch Gouty on 05/09/2020 13:48:28 -------------------------------------------------------------------------------- Wound Assessment Details Patient Name: Date of Service: [name], [name] 05/09/2020 1:15 PM Medical Record Number: [id number] Patient Account Number:  [id number] Date of Birth/Sex: Treating RN: 18-Nov-1921 (86 y.o. [name] Primary Care Saumya Hukill: Hollace Kinnier Other Clinician: Referring Raliegh Scobie: Treating Karmel Patricelli/Extender: Frederick Peers in Treatment: 29 Wound Status Wound Number: 4 Primary Trauma, Other Etiology: Wound Location: Right T Second oe Secondary Diabetic Wound/Ulcer of the Lower Extremity Wounding Event: Trauma Etiology: Date Acquired: 03/13/2020 Wound Open Weeks Of Treatment: 8 Status: Clustered Wound: No Comorbid Glaucoma, Chronic Obstructive Pulmonary Disease (COPD), History: Coronary Artery Disease, Hypertension, Type II Diabetes, Osteoarthritis, Dementia Photos Photo Uploaded By: Mikeal Hawthorne on 05/12/2020 14:03:37 Wound Measurements Length: (cm) 0.5 Width: (cm) 0.5 Depth: (cm) 0.1 Area: (cm) 0.196 Volume: (cm) 0.02 Wound Description Classification: Full Thickness With Exposed Support Struc Wound Margin: Flat and Intact Exudate Amount: Small Exudate Type: Serosanguineous Exudate Color: red, brown Foul Odor After Cleansing: Slough/Fibrino % Reduction in Area: 92% % Reduction in Volume: 95.9% Epithelialization: Large (67-100%) Tunneling: No Undermining: No tures No No Wound Bed Granulation Amount: Small (1-33%) Exposed Structure Granulation Quality: Red, Pink Fascia Exposed: No Necrotic Amount: None Present (0%) Fat Layer (Subcutaneous Tissue) Exposed: Yes Tendon Exposed: No Muscle Exposed: No Joint Exposed: No Bone Exposed: No Treatment Notes Wound #4 (Right Toe Second) 1. Cleanse With Wound Cleanser 3. Primary Dressing Applied Calcium Alginate Ag 4. Secondary Dressing Roll Gauze 5. Secured With Tape Notes Horticulturist, commercial) Signed: 05/09/2020 5:42:53 PM By: Baruch Gouty RN, BSN Entered By: Baruch Gouty on 05/09/2020 13:48:55 -------------------------------------------------------------------------------- Wound Assessment  Details Patient Name: Date of Service: [name], [name] 05/09/2020 1:15 PM Medical Record Number: [id number] Patient Account Number: [id number] Date of Birth/Sex: Treating RN: 10-08-1921 (86 y.o. [name] Primary Care Keoki Mchargue: Hollace Kinnier Other Clinician: Referring Deyton Ellenbecker: Treating Shanavia Makela/Extender: Frederick Peers in Treatment: 29 Wound Status Wound Number: 5 Primary Trauma, Other Etiology: Wound Location: Right T Third oe Secondary Diabetic Wound/Ulcer of the Lower Extremity Wounding Event: Trauma Etiology: Date Acquired: 03/13/2020 Wound Open Weeks Of Treatment: 8 Status: Clustered Wound: No Comorbid Glaucoma, Chronic Obstructive Pulmonary Disease (COPD), History: Coronary Artery Disease, Hypertension, Type II Diabetes, Osteoarthritis, Dementia Photos Photo Uploaded By: Mikeal Hawthorne on 05/12/2020 14:03:38 Wound Measurements Length: (cm) 0.4 Width: (cm) 0.3 Depth: (cm) 0.1 Area: (cm) 0.094 Volume: (cm) 0.009 % Reduction in Area: 76.1% % Reduction in Volume: 76.9% Epithelialization: Large (67-100%) Tunneling: No Undermining: No Wound Description Classification: Full Thickness Without Exposed Support  Struc Wound Margin: Flat and Intact Exudate Amount: None Present tures Foul Odor After Cleansing: No Slough/Fibrino No Wound Bed Granulation Amount: Small (1-33%) Exposed Structure Granulation Quality: Red Fascia Exposed: No Necrotic Amount: None Present (0%) Fat Layer (Subcutaneous Tissue) Exposed: Yes Tendon Exposed: No Muscle Exposed: No Joint Exposed: No Bone Exposed: No Treatment Notes Wound #5 (Right Toe Third) 1. Cleanse With Wound Cleanser 3. Primary Dressing Applied Calcium Alginate Ag 4. Secondary Dressing Roll Gauze 5. Secured With Tape Notes Horticulturist, commercial) Signed: 05/09/2020 5:42:53 PM By: Baruch Gouty RN, BSN Entered By: Baruch Gouty on 05/09/2020  13:49:13 -------------------------------------------------------------------------------- [place] Details Patient Name: Date of Service: [address] [name], [name] 05/09/2020 1:15 PM Medical Record Number: [id number] Patient Account Number: [id number] Date of Birth/Sex: Treating RN: [birth year] (86 y.o. [name] Primary Care Ernesto Lashway: Hollace Kinnier Other Clinician: Referring Cleston Lautner: Treating Kristalyn Bergstresser/Extender: Frederick Peers in Treatment: 29 Vital Signs Time Taken: 13:24 Temperature (F): 96.8 Height (in): 71 Pulse (bpm): 97 Weight (lbs): 175 Respiratory Rate (breaths/min): 18 Body Mass Index (BMI): 24.4 Blood Pressure (mmHg): 115/63 Reference Range: 80 - 120 mg / dl Electronic Signature(s) Signed: 05/09/2020 5:42:53 PM By: Baruch Gouty RN, BSN Entered By: Baruch Gouty on 05/09/2020 13:25:11

## 2020-05-12 NOTE — Progress Notes (Addendum)
PEIRCE, DEVENEY ([id number]) Visit Report for 05/09/2020 Debridement Details Patient Name: Date of Service: [name], [name] 05/09/2020 1:15 PM Medical Record Number: [id number] Patient Account Number: [id number] Date of Birth/Sex: Treating RN: 01-22-1922 (86 y.o. Marvis Repress Primary Care Provider: Hollace Kinnier Other Clinician: Referring Provider: Treating Provider/Extender: Frederick Peers in Treatment: 29 Debridement Performed for Assessment: Wound #1 Sacrum Performed By: Physician Ricard Dillon., MD Debridement Type: Debridement Level of Consciousness (Pre-procedure): Awake and Alert Pre-procedure Verification/Time Out Yes - 14:20 Taken: Start Time: 14:20 T Area Debrided (L x W): otal 1 (cm) x 1 (cm) = 1 (cm) Tissue and other material debrided: Viable, Non-Viable, Bone, Subcutaneous Level: Skin/Subcutaneous Tissue/Muscle/Bone Debridement Description: Excisional Instrument: Rongeur Specimen: Swab, Tissue Culture Number of Specimens T aken: 2 Bleeding: Moderate Hemostasis Achieved: Silver Nitrate End Time: 14:21 Procedural Pain: 0 Post Procedural Pain: 0 Response to Treatment: Procedure was tolerated well Level of Consciousness (Post- Awake and Alert procedure): Post Debridement Measurements of Total Wound Length: (cm) 3.4 Stage: Category/Stage IV Width: (cm) 3.6 Depth: (cm) 1.8 Volume: (cm) 17.304 Character of Wound/Ulcer Post Debridement: Requires Further Debridement Post Procedure Diagnosis Same as Pre-procedure Electronic Signature(s) Signed: 05/09/2020 5:35:06 PM By: Kela Millin Signed: 05/12/2020 5:03:49 PM By: Linton Ham MD Entered By: Linton Ham on 05/09/2020 14:44:03 -------------------------------------------------------------------------------- HPI Details Patient Name: Date of Service: [name], [name] 05/09/2020 1:15 PM Medical Record Number: [id number] Patient Account Number: [id number] Date of Birth/Sex:  Treating RN: 1922/05/16 (86 y.o. Marvis Repress Primary Care Provider: Hollace Kinnier Other Clinician: Referring Provider: Treating Provider/Extender: Frederick Peers in Treatment: 29 History of Present Illness HPI Description: /A3/17/2021 upon evaluation today patient presents for initial inspection here in our clinic concerning issues that has been having with a wound in the sacral region as well as the left heel. Fortunately neither appears to be overtly and significantly infected there is some necrotic tissue noted at both locations however that is going to be required to be addressed. He does have a history of diabetes mellitus type 2, vascular dementia, and hypertension. The patient did have a stroke in November 2020 unfortunately he has not been the same since that time according to his daughter. Prior to that he was taking care of himself now he is 100% dependent. He is at home with his daughter who is present during the office visit today as well that is where I get the majority of the history from at this point. Patient does have a history of hypertension as well which does not appear to be too out of control today which is good news. He does have a hemoglobin A1c of 6.05 May 2019. They currently have been using a calcium alginate dressing to the sacrum and it sounds like Skin-Prep and a foam on the heel. 4/9; pressure ulcers on the sacral area and left heel. Been using Anasept wet-to-dry on the sacrum and Hydrofera Blue in the heel. He has home health family is helping with the dressing. It sounds as though they are fairly religious about offloading these areas that have pressure relief surfaces for the bed. Wounds look better 4/30; I have not seen this patient in 3 weeks. We have been using Anasept wet-to-dry the sacrum and Hydrofera Blue on the heel. When I saw this 3 weeks ago the sacrum actually look quite good and I thought that we might be able to  transition him to a collagen-based dressing today. 5/10; the since the patient was  last here he was hospitalized from 5/5 through 12/07/2019. He was noted to have a pressure injury of the sacral region stage IV. MRI did not suggest discitis or osteomyelitis. There was nonspecific bilateral paraspinal muscle edema. Culture I did when he was here the last time showed E. coli I believe they are aware of this. Blood cultures were negative. His white count was 10.8 on admission 11.9 at discharge his comprehensive metabolic panel notable for an albumin of 2.4 BUN of 32 with creatinine of 0.03 hemoglobin was only 7.3 with a discharge hemoglobin of 8.1 after dropping to a hemoglobin of 6.6 He is back at home. Discharge antibiotic was Augmentin.[name] He was treated with IV Rocephin in the hospital. The patient was seen by palliative care I have not looked at this consult. They have been using dressings that they had left over at home I think most recently [facility]. According to his daughter the patient is eating fairly well 12/28/19-Patient is back at 2 weeks, the sacral wound is about the same, the right heel has healed and closed. We are using silver alginate to the sacral wound 6/18; we continue to follow the sacral wound. His heel wounds have closed. We have been using silver alginate to the sacrum 7/1; sacral wound. We have been using silver collagen with backing wet-to-dry. No open wounds on the heels. 7/15; some improvement in the dimensions of the sacral wound including the undermining area we have been using silver collagen with backing wet-to-dry dressings. We had some thoughts about trying a wound VAC. He has home health. 03/13/20-Sacral wound looks intact with healthy base, surrounding skin intact, no maceration, he did unfortunately develop to abrasion wounds on his first second and third toes dorsum of the Right foot this happened when his foot got trapped below the wheelchair Footguard 8/30;  sacral wound has come down slightly especially the undermining. We have been using a wound VAC She has 2 necrotic areas on the tip of the right first toe and the right second toe dorsally. 10/8 Is been about 5 weeks since I have seen this man. Family tells me that the sacrum really deteriorated over the last week or 2. The blame home health for some back issues. Dimensions here are a lot larger than last time including the undermining and there is now exposed bone. He had an MRI done in May when he was hospitalized at this area that did not show osteomyelitis nevertheless there is been a fairly significant deterioration since I last saw this wound. He has small abrasions on the right dorsal second and third toes which look as though they are progressing towards closure Electronic Signature(s) Signed: 05/12/2020 5:03:49 PM By: Linton Ham MD Entered By: Linton Ham on 05/09/2020 14:39:29 -------------------------------------------------------------------------------- Physical Exam Details Patient Name: Date of Service: [name], [name] 05/09/2020 1:15 PM Medical Record Number: [id number] Patient Account Number: [id number] Date of Birth/Sex: Treating RN: 1922-01-02 (86 y.o. Marvis Repress Primary Care Provider: Hollace Kinnier Other Clinician: Referring Provider: Treating Provider/Extender: Frederick Peers in Treatment: 29 Constitutional Sitting or standing Blood Pressure is within target range for patient.. Pulse regular and within target range for patient.[name] Respirations regular, non-labored and within target range.. Temperature is normal and within the target range for the patient.[name] Appears in no distress. Notes Wound exam Second and third toes are superficial and look as though they are gradually closing this is on the dorsal toes Significant deterioration in the sacral wound. There is  palpable bone wide deterioration in the undermining by more than double. I  used rongeurs to remove the piece of bone for culture as well as pathology. Electronic Signature(s) Signed: 05/12/2020 5:03:49 PM By: Linton Ham MD Entered By: Linton Ham on 05/09/2020 14:40:28 -------------------------------------------------------------------------------- Physician Orders Details Patient Name: Date of Service: [address] [name], [name] 05/09/2020 1:15 PM Medical Record Number: [id number] Patient Account Number: [id number] Date of Birth/Sex: Treating RN: 04-11-1922 (86 y.o. Marvis Repress Primary Care Provider: Hollace Kinnier Other Clinician: Referring Provider: Treating Provider/Extender: Frederick Peers in Treatment: 29 Verbal / Phone Orders: No Diagnosis Coding ICD-10 Coding Code Description L89.154 Pressure ulcer of sacral region, stage 4 E11.621 Type 2 diabetes mellitus with foot ulcer L97.518 Non-pressure chronic ulcer of other part of right foot with other specified severity L03.031 Cellulitis of right toe Follow-up Appointments ppointment in 2 weeks. - ***[facility] - ROOM 5*** Return A Dressing Change Frequency Change dressing every day. - But HH to do 3 times a week Skin Barriers/Peri-Wound Care Wound #1 Sacrum Skin Prep - to periwound Wound Cleansing Clean wound with Wound Cleanser - or normal saline Primary Wound Dressing Wound #1 Sacrum Calcium Alginate with Silver Wound #4 Right T Second oe Calcium Alginate with Silver Wound #5 Right T Third oe Calcium Alginate with Silver Secondary Dressing Wound #1 Sacrum ABD pad Wound #4 Right T Second oe Kerlix/Rolled Gauze Dry Gauze Wound #5 Right T Third oe Kerlix/Rolled Gauze Dry Gauze Negative Presssure Wound Therapy Wound #1 Sacrum Medela Wound Vac continuously at 180mm/hg - Discontinue Wound Vac Off-Loading Low air-loss mattress (Group 2) Turn and reposition every 2 hours Other: - float heels with pillows under calves while in bed [facility] skilled nursing for wound care. - [facility] naerobe culture (MICRO) - Culture of bone - (ICD10 L89.154 - Pressure ulcer of sacral region, Bacteria identified in Unspecified specimen by A stage 4) LOINC Code: 390-3 Convenience Name: Anerobic culture Bacteria identified in Tissue by Biopsy culture (MICRO) - Biopsy of bone - (ICD10 L89.154 - Pressure ulcer of sacral region, stage 4) LOINC Code: 00923-3 Convenience Name: Biopsy specimen culture Electronic Signature(s) Signed: 05/09/2020 5:35:06 PM By: Kela Millin Signed: 05/12/2020 5:03:49 PM By: Linton Ham MD Entered By: Kela Millin on 05/09/2020 14:16:26 -------------------------------------------------------------------------------- Problem List Details Patient Name: Date of Service: [name], [name] 05/09/2020 1:15 PM Medical Record Number: [id number] Patient Account Number: [id number] Date of Birth/Sex: Treating RN: [birth year] (86 y.o. Marvis Repress Primary Care Provider: Hollace Kinnier Other Clinician: Referring Provider: Treating Provider/Extender: Frederick Peers in Treatment: 29 Active Problems ICD-10 Encounter Code Description Active Date MDM Diagnosis L89.154 Pressure ulcer of sacral region, stage 4 10/17/2019 No Yes E11.621 Type 2 diabetes mellitus with foot ulcer 10/17/2019 No Yes L97.518 Non-pressure chronic ulcer of other part of right foot with other specified 03/31/2020 No Yes severity Inactive Problems ICD-10 Code Description Active Date Inactive Date L97.522 Non-pressure chronic ulcer of other part of left foot with fat layer exposed 10/17/2019 10/17/2019 F01.50 Vascular dementia without behavioral disturbance 10/17/2019 10/17/2019 I10 Essential (primary) hypertension 10/17/2019 10/17/2019 L03.031 Cellulitis of right toe 03/31/2020 03/31/2020 Resolved Problems Electronic Signature(s) Signed: 05/12/2020 5:03:49 PM By: Linton Ham MD Entered By: Linton Ham on 05/09/2020 14:37:48 -------------------------------------------------------------------------------- Progress Note Details Patient Name: Date of Service: [name], [name] 05/09/2020 1:15 PM Medical Record Number: [id number] Patient Account Number: [id number] Date of Birth/Sex: Treating RN: 1921/09/01 (86 y.o. Marvis Repress Primary Care Provider:  Hollace Kinnier Other Clinician: Referring Provider: Treating Provider/Extender: Frederick Peers in Treatment: 29 Subjective History of Present Illness (HPI) /A3/17/2021 upon evaluation today patient presents for initial inspection here in our clinic concerning issues that has been having with a wound in the sacral region as well as the left heel. Fortunately neither appears to be overtly and significantly infected there is some necrotic tissue noted at both locations however that is going to be required to be addressed. He does have a history of diabetes mellitus type 2, vascular dementia, and hypertension. The patient did have a stroke in November 2020 unfortunately he has not been the same since that time according to his daughter. Prior to that he was taking care of himself now he is 100% dependent. He is at home with his daughter who is present during the office visit today as well that is where I get the majority of the history from at this point. Patient does have a history of hypertension as well which does not appear to be too out of control today which is good news. He does have a hemoglobin A1c of 6.05 May 2019. They currently have been using a calcium alginate dressing to the sacrum and it sounds like Skin-Prep and a foam on the heel. 4/9; pressure ulcers on the sacral area and left heel. Been using Anasept wet-to-dry on the sacrum and Hydrofera Blue in the heel. He has home health family is helping with the dressing. It sounds as though they are fairly religious about offloading these areas that have  pressure relief surfaces for the bed. Wounds look better 4/30; I have not seen this patient in 3 weeks. We have been using Anasept wet-to-dry the sacrum and Hydrofera Blue on the heel. When I saw this 3 weeks ago the sacrum actually look quite good and I thought that we might be able to transition him to a collagen-based dressing today. 5/10; the since the patient was last here he was hospitalized from 5/5 through 12/07/2019. He was noted to have a pressure injury of the sacral region stage IV. MRI did not suggest discitis or osteomyelitis. There was nonspecific bilateral paraspinal muscle edema. Culture I did when he was here the last time showed E. coli I believe they are aware of this. Blood cultures were negative. His white count was 10.8 on admission 11.9 at discharge his comprehensive metabolic panel notable for an albumin of 2.4 BUN of 32 with creatinine of 0.03 hemoglobin was only 7.3 with a discharge hemoglobin of 8.1 after dropping to a hemoglobin of 6.6 He is back at home. Discharge antibiotic was Augmentin.[name] He was treated with IV Rocephin in the hospital. The patient was seen by palliative care I have not looked at this consult. They have been using dressings that they had left over at home I think most recently [facility]. According to his daughter the patient is eating fairly well 12/28/19-Patient is back at 2 weeks, the sacral wound is about the same, the right heel has healed and closed. We are using silver alginate to the sacral wound 6/18; we continue to follow the sacral wound. His heel wounds have closed. We have been using silver alginate to the sacrum 7/1; sacral wound. We have been using silver collagen with backing wet-to-dry. No open wounds on the heels. 7/15; some improvement in the dimensions of the sacral wound including the undermining area we have been using silver collagen with backing wet-to-dry dressings. We had some thoughts about  trying a wound VAC. He has home  health. 03/13/20-Sacral wound looks intact with healthy base, surrounding skin intact, no maceration, he did unfortunately develop to abrasion wounds on his first second and third toes dorsum of the Right foot this happened when his foot got trapped below the wheelchair Footguard 8/30; sacral wound has come down slightly especially the undermining. We have been using a wound VAC ooShe has 2 necrotic areas on the tip of the right first toe and the right second toe dorsally. 10/8 Is been about 5 weeks since I have seen this man. Family tells me that the sacrum really deteriorated over the last week or 2. The blame home health for some back issues. Dimensions here are a lot larger than last time including the undermining and there is now exposed bone. He had an MRI done in May when he was hospitalized at this area that did not show osteomyelitis nevertheless there is been a fairly significant deterioration since I last saw this wound. He has small abrasions on the right dorsal second and third toes which look as though they are progressing towards closure Objective Constitutional Sitting or standing Blood Pressure is within target range for patient.. Pulse regular and within target range for patient.[name] Respirations regular, non-labored and within target range.. Temperature is normal and within the target range for the patient.[name] Appears in no distress. Vitals Time Taken: 1:24 PM, Height: 71 in, Weight: 175 lbs, BMI: 24.4, Temperature: 96.8 F, Pulse: 97 bpm, Respiratory Rate: 18 breaths/min, Blood Pressure: 115/63 mmHg. General Notes: Wound exam ooSecond and third toes are superficial and look as though they are gradually closing this is on the dorsal toes ooSignificant deterioration in the sacral wound. There is palpable bone wide deterioration in the undermining by more than double. ooI used rongeurs to remove the piece of bone for culture as well as pathology. Integumentary (Hair, Skin) Wound #1  status is Open. Original cause of wound was Pressure Injury. The wound is located on the Sacrum. The wound measures 3.4cm length x 3.6cm width x 1.8cm depth; 9.613cm^2 area and 17.304cm^3 volume. There is bone and Fat Layer (Subcutaneous Tissue) exposed. There is no tunneling noted, however, there is undermining starting at 12:00 and ending at 5:00 with a maximum distance of 3.6cm. There is a medium amount of serosanguineous drainage noted. The wound margin is well defined and not attached to the wound base. There is medium (34-66%) red granulation within the wound bed. There is a medium (34- 66%) amount of necrotic tissue within the wound bed including Adherent Slough. Wound #3 status is Healed - Epithelialized. Original cause of wound was Trauma. The wound is located on the Right T Great. The wound measures 0cm length oe x 0cm width x 0cm depth; 0cm^2 area and 0cm^3 volume. There is no tunneling or undermining noted. There is a none present amount of drainage noted. The wound margin is flat and intact. There is no granulation within the wound bed. There is no necrotic tissue within the wound bed. Wound #4 status is Open. Original cause of wound was Trauma. The wound is located on the Right T Second. The wound measures 0.5cm length x 0.5cm oe width x 0.1cm depth; 0.196cm^2 area and 0.02cm^3 volume. There is Fat Layer (Subcutaneous Tissue) exposed. There is no tunneling or undermining noted. There is a small amount of serosanguineous drainage noted. The wound margin is flat and intact. There is small (1-33%) red, pink granulation within the wound bed. There is no  necrotic tissue within the wound bed. Wound #5 status is Open. Original cause of wound was Trauma. The wound is located on the Right T Third. The wound measures 0.4cm length x 0.3cm width oe x 0.1cm depth; 0.094cm^2 area and 0.009cm^3 volume. There is Fat Layer (Subcutaneous Tissue) exposed. There is no tunneling or undermining noted.  There is a none present amount of drainage noted. The wound margin is flat and intact. There is small (1-33%) red granulation within the wound bed. There is no necrotic tissue within the wound bed. Assessment Active Problems ICD-10 Pressure ulcer of sacral region, stage 4 Type 2 diabetes mellitus with foot ulcer Non-pressure chronic ulcer of other part of right foot with other specified severity Procedures Wound #1 Pre-procedure diagnosis of Wound #1 is a Pressure Ulcer located on the Sacrum . There was a Excisional Skin/Subcutaneous Tissue/Muscle/Bone Debridement with a total area of 1 sq cm performed by Ricard Dillon., MD. With the following instrument(s): Rongeur to remove Viable and Non-Viable tissue/material. Material removed includes Bone,Subcutaneous Tissue and. 2 specimens were taken by a Swab and Tissue Culture and sent to the lab per facility protocol. A time out was conducted at 14:20, prior to the start of the procedure. A Moderate amount of bleeding was controlled with Silver Nitrate. The procedure was tolerated well with a pain level of 0 throughout and a pain level of 0 following the procedure. Post Debridement Measurements: 3.4cm length x 3.6cm width x 1.8cm depth; 17.304cm^3 volume. Post debridement Stage noted as Category/Stage IV. Character of Wound/Ulcer Post Debridement requires further debridement. Post procedure Diagnosis Wound #1: Same as Pre-Procedure Plan Follow-up Appointments: Return Appointment in 2 weeks. - ***[facility] - ROOM 5*** Dressing Change Frequency: Change dressing every day. - But HH to do 3 times a week Skin Barriers/Peri-Wound Care: Wound #1 Sacrum: Skin Prep - to periwound Wound Cleansing: Clean wound with Wound Cleanser - or normal saline Primary Wound Dressing: Wound #1 Sacrum: Calcium Alginate with Silver Wound #4 Right T Second: oe Calcium Alginate with Silver Wound #5 Right T Third: oe Calcium Alginate with Silver Secondary  Dressing: Wound #1 Sacrum: ABD pad Wound #4 Right T Second: oe Kerlix/Rolled Gauze Dry Gauze Wound #5 Right T Third: oe Kerlix/Rolled Gauze Dry Gauze Negative Presssure Wound Therapy: Wound #1 Sacrum: Medela Wound Vac continuously at 119mm/hg - Discontinue Wound Vac Off-Loading: Low air-loss mattress (Group 2) Turn and reposition every 2 hours Other: - float heels with pillows under calves while in bed Home Health: [facility] skilled nursing for wound care. - [facility] ordered were: Anerobic culture, sacral - Culture of bone, Biopsy specimen culture, sacral - Biopsy of bone 1. Specimen of bone for culture and pathology follow-up in 2 weeks. I will try to address with oral antibiotics which she needs crushed and/or liquid. 2. I do not think he is a candidate for an MRI I do not think we could get him through this he did have one in May that was negative for osteomyelitis 3. I discontinued the wound VAC today in favor of silver collagen-based dressings. There were issues with keeping the wound VAC on and the wound is deteriorated after initially making a good improvement Electronic Signature(s) Signed: 05/13/2020 5:41:11 PM By: Linton Ham MD Signed: 05/15/2020 2:03:56 PM By: Levan Hurst RN, BSN Previous Signature: 05/12/2020 5:03:49 PM Version By: Linton Ham MD Entered By: Levan Hurst on 05/13/2020 16:00:54 -------------------------------------------------------------------------------- SuperBill Details Patient Name: Date of Service: [name], [name] 05/09/2020 Medical Record  Number: [id number] Patient Account Number: [id number] Date of Birth/Sex: Treating RN: 09-Aug-1921 (86 y.o. Marvis Repress Primary Care Provider: Hollace Kinnier Other Clinician: Referring Provider: Treating Provider/Extender: Frederick Peers in Treatment: 29 Diagnosis Coding ICD-10 Codes Code Description L89.154 Pressure ulcer of sacral  region, stage 4 E11.621 Type 2 diabetes mellitus with foot ulcer L97.518 Non-pressure chronic ulcer of other part of right foot with other specified severity Facility Procedures CPT4 Code: [id number] Description: 10312 - DEB BONE 20 SQ CM/< ICD-10 Diagnosis Description L89.154 Pressure ulcer of sacral region, stage 4 Modifier: Quantity: 1 Physician Procedures CPT4: Description Modifier Code [id number] Debridement; bone (includes epidermis, dermis, subQ tissue, muscle and/or fascia, if performed) 1st 20 sqcm or less ICD-10 Diagnosis Description L89.154 Pressure ulcer of sacral region, stage 4 Quantity: 1 Electronic Signature(s) Signed: 05/12/2020 5:03:49 PM By: Linton Ham MD Entered By: Linton Ham on 05/09/2020 14:44:18

## 2020-05-14 DIAGNOSIS — L89154 Pressure ulcer of sacral region, stage 4: Secondary | ICD-10-CM | POA: Diagnosis not present

## 2020-05-14 DIAGNOSIS — N183 Chronic kidney disease, stage 3 unspecified: Secondary | ICD-10-CM | POA: Diagnosis not present

## 2020-05-14 DIAGNOSIS — I5032 Chronic diastolic (congestive) heart failure: Secondary | ICD-10-CM | POA: Diagnosis not present

## 2020-05-14 DIAGNOSIS — I69351 Hemiplegia and hemiparesis following cerebral infarction affecting right dominant side: Secondary | ICD-10-CM | POA: Diagnosis not present

## 2020-05-14 DIAGNOSIS — I2699 Other pulmonary embolism without acute cor pulmonale: Secondary | ICD-10-CM | POA: Diagnosis not present

## 2020-05-14 DIAGNOSIS — E1122 Type 2 diabetes mellitus with diabetic chronic kidney disease: Secondary | ICD-10-CM | POA: Diagnosis not present

## 2020-05-14 DIAGNOSIS — Z7901 Long term (current) use of anticoagulants: Secondary | ICD-10-CM | POA: Diagnosis not present

## 2020-05-14 DIAGNOSIS — I130 Hypertensive heart and chronic kidney disease with heart failure and stage 1 through stage 4 chronic kidney disease, or unspecified chronic kidney disease: Secondary | ICD-10-CM | POA: Diagnosis not present

## 2020-05-14 DIAGNOSIS — F0391 Unspecified dementia with behavioral disturbance: Secondary | ICD-10-CM | POA: Diagnosis not present

## 2020-05-14 LAB — AEROBIC/ANAEROBIC CULTURE W GRAM STAIN (SURGICAL/DEEP WOUND): Gram Stain: NONE SEEN

## 2020-05-19 DIAGNOSIS — Z7901 Long term (current) use of anticoagulants: Secondary | ICD-10-CM | POA: Diagnosis not present

## 2020-05-19 DIAGNOSIS — I5032 Chronic diastolic (congestive) heart failure: Secondary | ICD-10-CM | POA: Diagnosis not present

## 2020-05-19 DIAGNOSIS — I130 Hypertensive heart and chronic kidney disease with heart failure and stage 1 through stage 4 chronic kidney disease, or unspecified chronic kidney disease: Secondary | ICD-10-CM | POA: Diagnosis not present

## 2020-05-19 DIAGNOSIS — N183 Chronic kidney disease, stage 3 unspecified: Secondary | ICD-10-CM | POA: Diagnosis not present

## 2020-05-19 DIAGNOSIS — F0391 Unspecified dementia with behavioral disturbance: Secondary | ICD-10-CM | POA: Diagnosis not present

## 2020-05-19 DIAGNOSIS — I69351 Hemiplegia and hemiparesis following cerebral infarction affecting right dominant side: Secondary | ICD-10-CM | POA: Diagnosis not present

## 2020-05-19 DIAGNOSIS — I2699 Other pulmonary embolism without acute cor pulmonale: Secondary | ICD-10-CM | POA: Diagnosis not present

## 2020-05-19 DIAGNOSIS — L89154 Pressure ulcer of sacral region, stage 4: Secondary | ICD-10-CM | POA: Diagnosis not present

## 2020-05-19 DIAGNOSIS — E1122 Type 2 diabetes mellitus with diabetic chronic kidney disease: Secondary | ICD-10-CM | POA: Diagnosis not present

## 2020-05-22 DIAGNOSIS — F0391 Unspecified dementia with behavioral disturbance: Secondary | ICD-10-CM | POA: Diagnosis not present

## 2020-05-22 DIAGNOSIS — I5032 Chronic diastolic (congestive) heart failure: Secondary | ICD-10-CM | POA: Diagnosis not present

## 2020-05-22 DIAGNOSIS — Z7901 Long term (current) use of anticoagulants: Secondary | ICD-10-CM | POA: Diagnosis not present

## 2020-05-22 DIAGNOSIS — L89154 Pressure ulcer of sacral region, stage 4: Secondary | ICD-10-CM | POA: Diagnosis not present

## 2020-05-22 DIAGNOSIS — I130 Hypertensive heart and chronic kidney disease with heart failure and stage 1 through stage 4 chronic kidney disease, or unspecified chronic kidney disease: Secondary | ICD-10-CM | POA: Diagnosis not present

## 2020-05-22 DIAGNOSIS — I2699 Other pulmonary embolism without acute cor pulmonale: Secondary | ICD-10-CM | POA: Diagnosis not present

## 2020-05-22 DIAGNOSIS — N183 Chronic kidney disease, stage 3 unspecified: Secondary | ICD-10-CM | POA: Diagnosis not present

## 2020-05-22 DIAGNOSIS — I69351 Hemiplegia and hemiparesis following cerebral infarction affecting right dominant side: Secondary | ICD-10-CM | POA: Diagnosis not present

## 2020-05-22 DIAGNOSIS — E1122 Type 2 diabetes mellitus with diabetic chronic kidney disease: Secondary | ICD-10-CM | POA: Diagnosis not present

## 2020-05-23 ENCOUNTER — Other Ambulatory Visit: Payer: Self-pay

## 2020-05-23 ENCOUNTER — Encounter (HOSPITAL_BASED_OUTPATIENT_CLINIC_OR_DEPARTMENT_OTHER): Payer: Medicare HMO | Admitting: Internal Medicine

## 2020-05-23 DIAGNOSIS — B952 Enterococcus as the cause of diseases classified elsewhere: Secondary | ICD-10-CM | POA: Diagnosis not present

## 2020-05-23 DIAGNOSIS — M8618 Other acute osteomyelitis, other site: Secondary | ICD-10-CM | POA: Diagnosis not present

## 2020-05-23 DIAGNOSIS — E11621 Type 2 diabetes mellitus with foot ulcer: Secondary | ICD-10-CM | POA: Diagnosis not present

## 2020-05-23 DIAGNOSIS — L97518 Non-pressure chronic ulcer of other part of right foot with other specified severity: Secondary | ICD-10-CM | POA: Diagnosis not present

## 2020-05-23 DIAGNOSIS — L89154 Pressure ulcer of sacral region, stage 4: Secondary | ICD-10-CM | POA: Diagnosis not present

## 2020-05-23 NOTE — Progress Notes (Signed)
[name] ([id number]) Visit Report for 05/23/2020 Debridement Details Patient Name: Date of Service: [name], [name] 05/23/2020 1:00 PM Medical Record Number: [id number] Patient Account Number: [id number] Date of Birth/Sex: Treating RN: [birth year] (86 y.o. [name] Primary Care Provider: Hollace Kinnier Other Clinician: Referring Provider: Treating Provider/Extender: Frederick Peers in Treatment: 31 Debridement Performed for Assessment: Wound #1 Sacrum Performed By: Physician Ricard Dillon., MD Debridement Type: Debridement Level of Consciousness (Pre-procedure): Awake and Alert Pre-procedure Verification/Time Out Yes - 14:00 Taken: Start Time: 14:01 Pain Control: Other : benzocaine 20% spray T Area Debrided (L x W): otal 1 (cm) x 1 (cm) = 1 (cm) Tissue and other material debrided: Viable, Non-Viable, Slough, Skin: Epidermis, Slough Level: Skin/Epidermis Debridement Description: Selective/Open Wound Instrument: Forceps, Scissors Bleeding: Minimum Hemostasis Achieved: Pressure End Time: 14:04 Procedural Pain: 4 Post Procedural Pain: 0 Response to Treatment: Procedure was tolerated well Level of Consciousness (Post- Awake and Alert procedure): Post Debridement Measurements of Total Wound Length: (cm) 3.2 Stage: Category/Stage IV Width: (cm) 3.7 Depth: (cm) 2.5 Volume: (cm) 23.248 Character of Wound/Ulcer Post Debridement: Requires Further Debridement Post Procedure Diagnosis Same as Pre-procedure Electronic Signature(s) Signed: 05/23/2020 4:52:36 PM By: Linton Ham MD Signed: 05/23/2020 5:21:31 PM By: Baruch Gouty RN, BSN Entered By: Linton Ham on 05/23/2020 14:08:04 -------------------------------------------------------------------------------- HPI Details Patient Name: Date of Service: [facility] 05/23/2020 1:00 PM Medical Record Number: [id number] Patient Account Number: [id number] Date of Birth/Sex:  Treating RN: [birth year] (86 y.o. [name] Primary Care Provider: Hollace Kinnier Other Clinician: Referring Provider: Treating Provider/Extender: Frederick Peers in Treatment: 31 History of Present Illness HPI Description: /A3/17/2021 upon evaluation today patient presents for initial inspection here in our clinic concerning issues that has been having with a wound in the sacral region as well as the left heel. Fortunately neither appears to be overtly and significantly infected there is some necrotic tissue noted at both locations however that is going to be required to be addressed. He does have a history of diabetes mellitus type 2, vascular dementia, and hypertension. The patient did have a stroke in November 2020 unfortunately he has not been the same since that time according to his daughter. Prior to that he was taking care of himself now he is 100% dependent. He is at home with his daughter who is present during the office visit today as well that is where I get the majority of the history from at this point. Patient does have a history of hypertension as well which does not appear to be too out of control today which is good news. He does have a hemoglobin A1c of 6.05 May 2019. They currently have been using a calcium alginate dressing to the sacrum and it sounds like Skin-Prep and a foam on the heel. 4/9; pressure ulcers on the sacral area and left heel. Been using Anasept wet-to-dry on the sacrum and Hydrofera Blue in the heel. He has home health family is helping with the dressing. It sounds as though they are fairly religious about offloading these areas that have pressure relief surfaces for the bed. Wounds look better 4/30; I have not seen this patient in 3 weeks. We have been using Anasept wet-to-dry the sacrum and Hydrofera Blue on the heel. When I saw this 3 weeks ago the sacrum actually look quite good and I thought that we might be able to  transition him to a collagen-based dressing today. 5/10; the since the patient  was last here he was hospitalized from 5/5 through 12/07/2019. He was noted to have a pressure injury of the sacral region stage IV. MRI did not suggest discitis or osteomyelitis. There was nonspecific bilateral paraspinal muscle edema. Culture I did when he was here the last time showed E. coli I believe they are aware of this. Blood cultures were negative. His white count was 10.8 on admission 11.9 at discharge his comprehensive metabolic panel notable for an albumin of 2.4 BUN of 32 with creatinine of 0.03 hemoglobin was only 7.3 with a discharge hemoglobin of 8.1 after dropping to a hemoglobin of 6.6 He is back at home. Discharge antibiotic was Augmentin.[name] He was treated with IV Rocephin in the hospital. The patient was seen by palliative care I have not looked at this consult. They have been using dressings that they had left over at home I think most recently [facility]. According to his daughter the patient is eating fairly well 12/28/19-Patient is back at 2 weeks, the sacral wound is about the same, the right heel has healed and closed. We are using silver alginate to the sacral wound 6/18; we continue to follow the sacral wound. His heel wounds have closed. We have been using silver alginate to the sacrum 7/1; sacral wound. We have been using silver collagen with backing wet-to-dry. No open wounds on the heels. 7/15; some improvement in the dimensions of the sacral wound including the undermining area we have been using silver collagen with backing wet-to-dry dressings. We had some thoughts about trying a wound VAC. He has home health. 03/13/20-Sacral wound looks intact with healthy base, surrounding skin intact, no maceration, he did unfortunately develop to abrasion wounds on his first second and third toes dorsum of the Right foot this happened when his foot got trapped below the wheelchair Footguard 8/30;  sacral wound has come down slightly especially the undermining. We have been using a wound VAC She has 2 necrotic areas on the tip of the right first toe and the right second toe dorsally. 10/8 Is been about 5 weeks since I have seen this man. Family tells me that the sacrum really deteriorated over the last week or 2. The blame home health for some back issues. Dimensions here are a lot larger than last time including the undermining and there is now exposed bone. He had an MRI done in May when he was hospitalized at this area that did not show osteomyelitis nevertheless there is been a fairly significant deterioration since I last saw this wound. He has small abrasions on the right dorsal second and third toes which look as though they are progressing towards closure 10/22; the patient came in 2 weeks ago with exposed bone. Sample showed osteomyelitis culture Enterococcus. Given his frailty and the state of his dementia I elected to try to treat him with oral Augmentin currently on 500 3 times daily. I am going to try to keep him on this for 6 weeks. He is eating and drinking well taking the antibiotics well. So far no overt side effects Electronic Signature(s) Signed: 05/23/2020 4:52:36 PM By: Linton Ham MD Entered By: Linton Ham on 05/23/2020 14:10:01 -------------------------------------------------------------------------------- Physical Exam Details Patient Name: Date of Service: WIL, [facility] 05/23/2020 1:00 PM Medical Record Number: [id number] Patient Account Number: [id number] Date of Birth/Sex: Treating RN: 07/16/1922 (86 y.o. [name] Primary Care Provider: Hollace Kinnier Other Clinician: Referring Provider: Treating Provider/Extender: Frederick Peers in Treatment: 21 Constitutional Patient  is hypotensive. However appears in usual state. Pulse regular and within target range for patient.[name] Respirations regular, non-labored and within  target range.. Temperature is normal and within the target range for the patient.[name] Appears in no distress. Respiratory work of breathing is normal. Cardiovascular Does not appear dehydrated. Gastrointestinal (GI) Abdomen is soft and non-distended without masses or tenderness.. Notes Wound exam; second and third toes are healed. His wound actually looks better. Granulation tissue looks healthy no exposed bone no soft tissue issues are seen Electronic Signature(s) Signed: 05/23/2020 4:52:36 PM By: Linton Ham MD Entered By: Linton Ham on 05/23/2020 14:11:10 -------------------------------------------------------------------------------- Physician Orders Details Patient Name: Date of Service: [facility] BENEDICTO, CAPOZZI 05/23/2020 1:00 PM Medical Record Number: [id number] Patient Account Number: [id number] Date of Birth/Sex: Treating RN: 07/15/1922 (86 y.o. [name] Primary Care Provider: Hollace Kinnier Other Clinician: Referring Provider: Treating Provider/Extender: Frederick Peers in Treatment: 31 Verbal / Phone Orders: No Diagnosis Coding ICD-10 Coding Code Description L89.154 Pressure ulcer of sacral region, stage 4 E11.621 Type 2 diabetes mellitus with foot ulcer L97.518 Non-pressure chronic ulcer of other part of right foot with other specified severity Follow-up Appointments ppointment in 2 weeks. - ***[facility] - ROOM 5*** Return A Dressing Change Frequency Change dressing every day. - But HH to do 3 times a week Skin Barriers/Peri-Wound Care Wound #1 Sacrum Skin Prep - to periwound Wound Cleansing Clean wound with Wound Cleanser - or normal saline Primary Wound Dressing Wound #1 Sacrum Calcium Alginate with Silver Secondary Dressing Wound #1 Sacrum ABD pad Off-Loading Low air-loss mattress (Group 2) Turn and reposition every 2 hours Other: - float heels with pillows under calves while in bed Additional Orders / Instructions Other: -  call clinic to report if 3 or more liquid stools per day for 72 hrs [place] skilled nursing for wound care. - [facility] Electronic Signature(s) Signed: 05/23/2020 4:52:36 PM By: Linton Ham MD Signed: 05/23/2020 5:21:31 PM By: Baruch Gouty RN, BSN Entered By: Baruch Gouty on 05/23/2020 14:07:14 -------------------------------------------------------------------------------- Problem List Details Patient Name: Date of Service: [facility] ABDULAHI, SCHOR 05/23/2020 1:00 PM Medical Record Number: [id number] Patient Account Number: [id number] Date of Birth/Sex: Treating RN: 04-29-1922 (86 y.o. [name] Primary Care Provider: Hollace Kinnier Other Clinician: Referring Provider: Treating Provider/Extender: Frederick Peers in Treatment: 31 Active Problems ICD-10 Encounter Code Description Active Date MDM Diagnosis L89.154 Pressure ulcer of sacral region, stage 4 10/17/2019 No Yes E11.621 Type 2 diabetes mellitus with foot ulcer 10/17/2019 No Yes L97.518 Non-pressure chronic ulcer of other part of right foot with other specified 03/31/2020 No Yes severity M86.18 Other acute osteomyelitis, other site 05/23/2020 No Yes Inactive Problems ICD-10 Code Description Active Date Inactive Date L97.522 Non-pressure chronic ulcer of other part of left foot with fat layer exposed 10/17/2019 10/17/2019 F01.50 Vascular dementia without behavioral disturbance 10/17/2019 10/17/2019 I10 Essential (primary) hypertension 10/17/2019 10/17/2019 L03.031 Cellulitis of right toe 03/31/2020 03/31/2020 Resolved Problems Electronic Signature(s) Signed: 05/23/2020 4:52:36 PM By: Linton Ham MD Entered By: Linton Ham on 05/23/2020 14:13:52 -------------------------------------------------------------------------------- Progress Note Details Patient Name: Date of Service: [facility] 05/23/2020 1:00 PM Medical Record Number: [id number] Patient Account  Number: [id number] Date of Birth/Sex: Treating RN: March 05, 1922 (86 y.o. [name] Primary Care Provider: Hollace Kinnier Other Clinician: Referring Provider: Treating Provider/Extender: Frederick Peers in Treatment: 31 Subjective History of Present Illness (HPI) /A3/17/2021 upon evaluation today patient presents for initial inspection here in our  clinic concerning issues that has been having with a wound in the sacral region as well as the left heel. Fortunately neither appears to be overtly and significantly infected there is some necrotic tissue noted at both locations however that is going to be required to be addressed. He does have a history of diabetes mellitus type 2, vascular dementia, and hypertension. The patient did have a stroke in November 2020 unfortunately he has not been the same since that time according to his daughter. Prior to that he was taking care of himself now he is 100% dependent. He is at home with his daughter who is present during the office visit today as well that is where I get the majority of the history from at this point. Patient does have a history of hypertension as well which does not appear to be too out of control today which is good news. He does have a hemoglobin A1c of 6.05 May 2019. They currently have been using a calcium alginate dressing to the sacrum and it sounds like Skin-Prep and a foam on the heel. 4/9; pressure ulcers on the sacral area and left heel. Been using Anasept wet-to-dry on the sacrum and Hydrofera Blue in the heel. He has home health family is helping with the dressing. It sounds as though they are fairly religious about offloading these areas that have pressure relief surfaces for the bed. Wounds look better 4/30; I have not seen this patient in 3 weeks. We have been using Anasept wet-to-dry the sacrum and Hydrofera Blue on the heel. When I saw this 3 weeks ago the sacrum actually look quite good and I  thought that we might be able to transition him to a collagen-based dressing today. 5/10; the since the patient was last here he was hospitalized from 5/5 through 12/07/2019. He was noted to have a pressure injury of the sacral region stage IV. MRI did not suggest discitis or osteomyelitis. There was nonspecific bilateral paraspinal muscle edema. Culture I did when he was here the last time showed E. coli I believe they are aware of this. Blood cultures were negative. His white count was 10.8 on admission 11.9 at discharge his comprehensive metabolic panel notable for an albumin of 2.4 BUN of 32 with creatinine of 0.03 hemoglobin was only 7.3 with a discharge hemoglobin of 8.1 after dropping to a hemoglobin of 6.6 He is back at home. Discharge antibiotic was Augmentin.[name] He was treated with IV Rocephin in the hospital. The patient was seen by palliative care I have not looked at this consult. They have been using dressings that they had left over at home I think most recently [facility]. According to his daughter the patient is eating fairly well 12/28/19-Patient is back at 2 weeks, the sacral wound is about the same, the right heel has healed and closed. We are using silver alginate to the sacral wound 6/18; we continue to follow the sacral wound. His heel wounds have closed. We have been using silver alginate to the sacrum 7/1; sacral wound. We have been using silver collagen with backing wet-to-dry. No open wounds on the heels. 7/15; some improvement in the dimensions of the sacral wound including the undermining area we have been using silver collagen with backing wet-to-dry dressings. We had some thoughts about trying a wound VAC. He has home health. 03/13/20-Sacral wound looks intact with healthy base, surrounding skin intact, no maceration, he did unfortunately develop to abrasion wounds on his first second and third toes  dorsum of the Right foot this happened when his foot got trapped below the  wheelchair Footguard 8/30; sacral wound has come down slightly especially the undermining. We have been using a wound VAC ooShe has 2 necrotic areas on the tip of the right first toe and the right second toe dorsally. 10/8 Is been about 5 weeks since I have seen this man. Family tells me that the sacrum really deteriorated over the last week or 2. The blame home health for some back issues. Dimensions here are a lot larger than last time including the undermining and there is now exposed bone. He had an MRI done in May when he was hospitalized at this area that did not show osteomyelitis nevertheless there is been a fairly significant deterioration since I last saw this wound. He has small abrasions on the right dorsal second and third toes which look as though they are progressing towards closure 10/22; the patient came in 2 weeks ago with exposed bone. Sample showed osteomyelitis culture Enterococcus. Given his frailty and the state of his dementia I elected to try to treat him with oral Augmentin currently on 500 3 times daily. I am going to try to keep him on this for 6 weeks. He is eating and drinking well taking the antibiotics well. So far no overt side effects Objective Constitutional Patient is hypotensive. However appears in usual state. Pulse regular and within target range for patient.[name] Respirations regular, non-labored and within target range.. Temperature is normal and within the target range for the patient.[name] Appears in no distress. Vitals Time Taken: 1:20 PM, Height: 71 in, Weight: 175 lbs, BMI: 24.4, Temperature: 97.9 F, Pulse: 74 bpm, Respiratory Rate: 18 breaths/min, Blood Pressure: 95/45 mmHg. Respiratory work of breathing is normal. Cardiovascular Does not appear dehydrated. Gastrointestinal (GI) Abdomen is soft and non-distended without masses or tenderness.. General Notes: Wound exam; second and third toes are healed. ooHis wound actually looks better. Granulation  tissue looks healthy no exposed bone no soft tissue issues are seen Integumentary (Hair, Skin) Wound #1 status is Open. Original cause of wound was Pressure Injury. The wound is located on the Sacrum. The wound measures 3.2cm length x 3.7cm width x 2.5cm depth; 9.299cm^2 area and 23.248cm^3 volume. There is bone and Fat Layer (Subcutaneous Tissue) exposed. There is no tunneling noted, however, there is undermining starting at 10:00 and ending at 5:00 with a maximum distance of 4.6cm. There is a medium amount of serosanguineous drainage noted. The wound margin is well defined and not attached to the wound base. There is medium (34-66%) red granulation within the wound bed. There is a medium (34- 66%) amount of necrotic tissue within the wound bed including Adherent Slough. Wound #4 status is Healed - Epithelialized. Original cause of wound was Trauma. The wound is located on the Right T Second. The wound measures 0cm oe length x 0cm width x 0cm depth; 0cm^2 area and 0cm^3 volume. There is no tunneling or undermining noted. There is a none present amount of drainage noted. The wound margin is distinct with the outline attached to the wound base. There is no granulation within the wound bed. There is no necrotic tissue within the wound bed. Wound #5 status is Healed - Epithelialized. Original cause of wound was Trauma. The wound is located on the Right T Third. The wound measures 0cm length oe x 0cm width x 0cm depth; 0cm^2 area and 0cm^3 volume. There is no tunneling or undermining noted. There is a none  present amount of drainage noted. The wound margin is distinct with the outline attached to the wound base. There is no granulation within the wound bed. There is no necrotic tissue within the wound bed. Assessment Active Problems ICD-10 Pressure ulcer of sacral region, stage 4 Type 2 diabetes mellitus with foot ulcer Non-pressure chronic ulcer of other part of right foot with other specified  severity Other acute osteomyelitis, other site Procedures Wound #1 Pre-procedure diagnosis of Wound #1 is a Pressure Ulcer located on the Sacrum . There was a Selective/Open Wound Skin/Epidermis Debridement with a total area of 1 sq cm performed by Ricard Dillon., MD. With the following instrument(s): Forceps, and Scissors to remove Viable and Non-Viable tissue/material. Material removed includes Slough and Skin: Epidermis and after achieving pain control using Other (benzocaine 20% spray). No specimens were taken. A time out was conducted at 14:00, prior to the start of the procedure. A Minimum amount of bleeding was controlled with Pressure. The procedure was tolerated well with a pain level of 4 throughout and a pain level of 0 following the procedure. Post Debridement Measurements: 3.2cm length x 3.7cm width x 2.5cm depth; 23.248cm^3 volume. Post debridement Stage noted as Category/Stage IV. Character of Wound/Ulcer Post Debridement requires further debridement. Post procedure Diagnosis Wound #1: Same as Pre-Procedure Plan Follow-up Appointments: Return Appointment in 2 weeks. - ***[facility] - ROOM 5*** Dressing Change Frequency: Change dressing every day. - But HH to do 3 times a week Skin Barriers/Peri-Wound Care: Wound #1 Sacrum: Skin Prep - to periwound Wound Cleansing: Clean wound with Wound Cleanser - or normal saline Primary Wound Dressing: Wound #1 Sacrum: Calcium Alginate with Silver Secondary Dressing: Wound #1 Sacrum: ABD pad Off-Loading: Low air-loss mattress (Group 2) Turn and reposition every 2 hours Other: - float heels with pillows under calves while in bed Additional Orders / Instructions: Other: - call clinic to report if 3 or more liquid stools per day for 72 hrs Home Health: [place] [facility] for wound care. - [facility]. Enterococcus osteomyelitis. He is on oral Augmentin solution 500 mg 3 times daily. I intend to use 6 weeks 2. I have  told his daughter that if this deteriorates or he gets ill then I will look at PICC line's and IV antibiotics through infectious disease. So far things seem to be going well with the oral antibiotics Electronic Signature(s) Signed: 05/23/2020 2:14:19 PM By: Linton Ham MD Entered By: Linton Ham on 05/23/2020 14:14:18 -------------------------------------------------------------------------------- SuperBill Details Patient Name: Date of Service: [facility] NUMA, SCHROETER 05/23/2020 Medical Record Number: [id number] Patient Account Number: [id number] Date of Birth/Sex: Treating RN: 1922/01/29 (86 y.o. [name] Primary Care Provider: Hollace Kinnier Other Clinician: Referring Provider: Treating Provider/Extender: Frederick Peers in Treatment: 31 Diagnosis Coding ICD-10 Codes Code Description [phone] Pressure ulcer of sacral region, stage 4 E11.621 Type 2 diabetes mellitus with foot ulcer L97.518 Non-pressure chronic ulcer of other part of right foot with other specified severity Facility Procedures CPT4 Code: [id number] Description: 02774 - DEB SUBQ TISSUE 20 SQ CM/< ICD-10 Diagnosis Description L89.154 Pressure ulcer of sacral region, stage 4 Modifier: Quantity: 1 Physician Procedures : CPT4 Code Description Modifier [id number] - WC PHYS SUBQ TISS 20 SQ CM ICD-10 Diagnosis Description L89.154 Pressure ulcer of sacral region, stage 4 Quantity: 1 Electronic Signature(s) Signed: 05/23/2020 4:52:36 PM By: Linton Ham MD Signed: 05/23/2020 5:21:31 PM By: Baruch Gouty RN, BSN Entered By: Baruch Gouty on 05/23/2020 14:08:03

## 2020-05-26 DIAGNOSIS — I69351 Hemiplegia and hemiparesis following cerebral infarction affecting right dominant side: Secondary | ICD-10-CM | POA: Diagnosis not present

## 2020-05-26 DIAGNOSIS — F0391 Unspecified dementia with behavioral disturbance: Secondary | ICD-10-CM | POA: Diagnosis not present

## 2020-05-26 DIAGNOSIS — E1122 Type 2 diabetes mellitus with diabetic chronic kidney disease: Secondary | ICD-10-CM | POA: Diagnosis not present

## 2020-05-26 DIAGNOSIS — I2699 Other pulmonary embolism without acute cor pulmonale: Secondary | ICD-10-CM | POA: Diagnosis not present

## 2020-05-26 DIAGNOSIS — N183 Chronic kidney disease, stage 3 unspecified: Secondary | ICD-10-CM | POA: Diagnosis not present

## 2020-05-26 DIAGNOSIS — L89154 Pressure ulcer of sacral region, stage 4: Secondary | ICD-10-CM | POA: Diagnosis not present

## 2020-05-26 DIAGNOSIS — I5032 Chronic diastolic (congestive) heart failure: Secondary | ICD-10-CM | POA: Diagnosis not present

## 2020-05-26 DIAGNOSIS — Z7901 Long term (current) use of anticoagulants: Secondary | ICD-10-CM | POA: Diagnosis not present

## 2020-05-26 DIAGNOSIS — I130 Hypertensive heart and chronic kidney disease with heart failure and stage 1 through stage 4 chronic kidney disease, or unspecified chronic kidney disease: Secondary | ICD-10-CM | POA: Diagnosis not present

## 2020-05-29 DIAGNOSIS — I130 Hypertensive heart and chronic kidney disease with heart failure and stage 1 through stage 4 chronic kidney disease, or unspecified chronic kidney disease: Secondary | ICD-10-CM | POA: Diagnosis not present

## 2020-05-29 DIAGNOSIS — Z7901 Long term (current) use of anticoagulants: Secondary | ICD-10-CM | POA: Diagnosis not present

## 2020-05-29 DIAGNOSIS — N183 Chronic kidney disease, stage 3 unspecified: Secondary | ICD-10-CM | POA: Diagnosis not present

## 2020-05-29 DIAGNOSIS — F0391 Unspecified dementia with behavioral disturbance: Secondary | ICD-10-CM | POA: Diagnosis not present

## 2020-05-29 DIAGNOSIS — I69351 Hemiplegia and hemiparesis following cerebral infarction affecting right dominant side: Secondary | ICD-10-CM | POA: Diagnosis not present

## 2020-05-29 DIAGNOSIS — I5032 Chronic diastolic (congestive) heart failure: Secondary | ICD-10-CM | POA: Diagnosis not present

## 2020-05-29 DIAGNOSIS — L89154 Pressure ulcer of sacral region, stage 4: Secondary | ICD-10-CM | POA: Diagnosis not present

## 2020-05-29 DIAGNOSIS — E1122 Type 2 diabetes mellitus with diabetic chronic kidney disease: Secondary | ICD-10-CM | POA: Diagnosis not present

## 2020-05-29 DIAGNOSIS — I2699 Other pulmonary embolism without acute cor pulmonale: Secondary | ICD-10-CM | POA: Diagnosis not present

## 2020-06-02 DIAGNOSIS — I2699 Other pulmonary embolism without acute cor pulmonale: Secondary | ICD-10-CM | POA: Diagnosis not present

## 2020-06-02 DIAGNOSIS — I5032 Chronic diastolic (congestive) heart failure: Secondary | ICD-10-CM | POA: Diagnosis not present

## 2020-06-02 DIAGNOSIS — L89154 Pressure ulcer of sacral region, stage 4: Secondary | ICD-10-CM | POA: Diagnosis not present

## 2020-06-02 DIAGNOSIS — I130 Hypertensive heart and chronic kidney disease with heart failure and stage 1 through stage 4 chronic kidney disease, or unspecified chronic kidney disease: Secondary | ICD-10-CM | POA: Diagnosis not present

## 2020-06-02 DIAGNOSIS — I69351 Hemiplegia and hemiparesis following cerebral infarction affecting right dominant side: Secondary | ICD-10-CM | POA: Diagnosis not present

## 2020-06-02 DIAGNOSIS — E1122 Type 2 diabetes mellitus with diabetic chronic kidney disease: Secondary | ICD-10-CM | POA: Diagnosis not present

## 2020-06-02 DIAGNOSIS — F0391 Unspecified dementia with behavioral disturbance: Secondary | ICD-10-CM | POA: Diagnosis not present

## 2020-06-02 DIAGNOSIS — N183 Chronic kidney disease, stage 3 unspecified: Secondary | ICD-10-CM | POA: Diagnosis not present

## 2020-06-02 DIAGNOSIS — Z7901 Long term (current) use of anticoagulants: Secondary | ICD-10-CM | POA: Diagnosis not present

## 2020-06-04 DIAGNOSIS — R2689 Other abnormalities of gait and mobility: Secondary | ICD-10-CM | POA: Diagnosis not present

## 2020-06-05 DIAGNOSIS — I2699 Other pulmonary embolism without acute cor pulmonale: Secondary | ICD-10-CM | POA: Diagnosis not present

## 2020-06-05 DIAGNOSIS — L89154 Pressure ulcer of sacral region, stage 4: Secondary | ICD-10-CM | POA: Diagnosis not present

## 2020-06-05 DIAGNOSIS — E1122 Type 2 diabetes mellitus with diabetic chronic kidney disease: Secondary | ICD-10-CM | POA: Diagnosis not present

## 2020-06-05 DIAGNOSIS — F0391 Unspecified dementia with behavioral disturbance: Secondary | ICD-10-CM | POA: Diagnosis not present

## 2020-06-05 DIAGNOSIS — I130 Hypertensive heart and chronic kidney disease with heart failure and stage 1 through stage 4 chronic kidney disease, or unspecified chronic kidney disease: Secondary | ICD-10-CM | POA: Diagnosis not present

## 2020-06-05 DIAGNOSIS — I69351 Hemiplegia and hemiparesis following cerebral infarction affecting right dominant side: Secondary | ICD-10-CM | POA: Diagnosis not present

## 2020-06-05 DIAGNOSIS — N183 Chronic kidney disease, stage 3 unspecified: Secondary | ICD-10-CM | POA: Diagnosis not present

## 2020-06-05 DIAGNOSIS — Z7901 Long term (current) use of anticoagulants: Secondary | ICD-10-CM | POA: Diagnosis not present

## 2020-06-05 DIAGNOSIS — I5032 Chronic diastolic (congestive) heart failure: Secondary | ICD-10-CM | POA: Diagnosis not present

## 2020-06-09 DIAGNOSIS — I130 Hypertensive heart and chronic kidney disease with heart failure and stage 1 through stage 4 chronic kidney disease, or unspecified chronic kidney disease: Secondary | ICD-10-CM | POA: Diagnosis not present

## 2020-06-09 DIAGNOSIS — N183 Chronic kidney disease, stage 3 unspecified: Secondary | ICD-10-CM | POA: Diagnosis not present

## 2020-06-09 DIAGNOSIS — E1122 Type 2 diabetes mellitus with diabetic chronic kidney disease: Secondary | ICD-10-CM | POA: Diagnosis not present

## 2020-06-09 DIAGNOSIS — U071 COVID-19: Secondary | ICD-10-CM | POA: Diagnosis not present

## 2020-06-09 DIAGNOSIS — I69351 Hemiplegia and hemiparesis following cerebral infarction affecting right dominant side: Secondary | ICD-10-CM | POA: Diagnosis not present

## 2020-06-09 DIAGNOSIS — Z7901 Long term (current) use of anticoagulants: Secondary | ICD-10-CM | POA: Diagnosis not present

## 2020-06-09 DIAGNOSIS — I5032 Chronic diastolic (congestive) heart failure: Secondary | ICD-10-CM | POA: Diagnosis not present

## 2020-06-09 DIAGNOSIS — I2699 Other pulmonary embolism without acute cor pulmonale: Secondary | ICD-10-CM | POA: Diagnosis not present

## 2020-06-09 DIAGNOSIS — F0391 Unspecified dementia with behavioral disturbance: Secondary | ICD-10-CM | POA: Diagnosis not present

## 2020-06-09 DIAGNOSIS — L89154 Pressure ulcer of sacral region, stage 4: Secondary | ICD-10-CM | POA: Diagnosis not present

## 2020-06-12 DIAGNOSIS — I69351 Hemiplegia and hemiparesis following cerebral infarction affecting right dominant side: Secondary | ICD-10-CM | POA: Diagnosis not present

## 2020-06-12 DIAGNOSIS — E1122 Type 2 diabetes mellitus with diabetic chronic kidney disease: Secondary | ICD-10-CM | POA: Diagnosis not present

## 2020-06-12 DIAGNOSIS — N183 Chronic kidney disease, stage 3 unspecified: Secondary | ICD-10-CM | POA: Diagnosis not present

## 2020-06-12 DIAGNOSIS — Z7901 Long term (current) use of anticoagulants: Secondary | ICD-10-CM | POA: Diagnosis not present

## 2020-06-12 DIAGNOSIS — I130 Hypertensive heart and chronic kidney disease with heart failure and stage 1 through stage 4 chronic kidney disease, or unspecified chronic kidney disease: Secondary | ICD-10-CM | POA: Diagnosis not present

## 2020-06-12 DIAGNOSIS — F0391 Unspecified dementia with behavioral disturbance: Secondary | ICD-10-CM | POA: Diagnosis not present

## 2020-06-12 DIAGNOSIS — I2699 Other pulmonary embolism without acute cor pulmonale: Secondary | ICD-10-CM | POA: Diagnosis not present

## 2020-06-12 DIAGNOSIS — L89154 Pressure ulcer of sacral region, stage 4: Secondary | ICD-10-CM | POA: Diagnosis not present

## 2020-06-12 DIAGNOSIS — I5032 Chronic diastolic (congestive) heart failure: Secondary | ICD-10-CM | POA: Diagnosis not present

## 2020-06-13 ENCOUNTER — Other Ambulatory Visit: Payer: Self-pay

## 2020-06-13 ENCOUNTER — Encounter (HOSPITAL_BASED_OUTPATIENT_CLINIC_OR_DEPARTMENT_OTHER): Payer: Medicare HMO | Attending: Internal Medicine | Admitting: Internal Medicine

## 2020-06-13 DIAGNOSIS — F0150 Vascular dementia without behavioral disturbance: Secondary | ICD-10-CM | POA: Diagnosis not present

## 2020-06-13 DIAGNOSIS — E11621 Type 2 diabetes mellitus with foot ulcer: Secondary | ICD-10-CM | POA: Diagnosis not present

## 2020-06-13 DIAGNOSIS — M8618 Other acute osteomyelitis, other site: Secondary | ICD-10-CM | POA: Insufficient documentation

## 2020-06-13 DIAGNOSIS — Z8673 Personal history of transient ischemic attack (TIA), and cerebral infarction without residual deficits: Secondary | ICD-10-CM | POA: Diagnosis not present

## 2020-06-13 DIAGNOSIS — L97518 Non-pressure chronic ulcer of other part of right foot with other specified severity: Secondary | ICD-10-CM | POA: Insufficient documentation

## 2020-06-13 DIAGNOSIS — L89154 Pressure ulcer of sacral region, stage 4: Secondary | ICD-10-CM | POA: Diagnosis not present

## 2020-06-13 DIAGNOSIS — E11622 Type 2 diabetes mellitus with other skin ulcer: Secondary | ICD-10-CM | POA: Diagnosis not present

## 2020-06-16 NOTE — Progress Notes (Signed)
[name], REASON ([id number]) Visit Report for 06/13/2020 Arrival Information Details Patient Name: Date of Service: [name], [name] 06/13/2020 1:15 PM Medical Record Number: [id number] Patient Account Number: [id number] Date of Birth/Sex: Treating RN: 1921/08/11 (86 y.o. [name]) Carlene Coria Primary Care Edrei Norgaard: PA Haig Prophet, [place] Other Clinician: Referring Alix Stowers: Treating Amana Bouska/Extender: Frederick Peers in Treatment: 85 Visit Information History Since Last Visit All ordered tests and consults were completed: No Patient Arrived: Wheel Chair Added or deleted any medications: No Arrival Time: 13:24 Any new allergies or adverse reactions: No Accompanied By: daughter Had a fall or experienced change in No Transfer Assistance: Manual activities of daily living that may affect Patient Identification Verified: Yes risk of falls: Secondary Verification Process Completed: Yes Signs or symptoms of abuse/neglect since last visito No Patient Requires Transmission-Based Precautions: No Hospitalized since last visit: No Patient Has Alerts: No Implantable device outside of the clinic excluding No cellular tissue based products placed in the center since last visit: Has Dressing in Place as Prescribed: Yes Pain Present Now: No Electronic Signature(s) Signed: 06/13/2020 5:42:44 PM By: Carlene Coria RN Entered By: Carlene Coria on 06/13/2020 13:25:10 -------------------------------------------------------------------------------- Clinic Level of Care Assessment Details Patient Name: Date of Service: [name] 06/13/2020 1:15 PM Medical Record Number: [id number] Patient Account Number: [id number] Date of Birth/Sex: Treating RN: January 13, 1922 (86 y.o. [name] Primary Care Kaiulani Sitton: PA Haig Prophet, [place] Other Clinician: Referring Elzena Muston: Treating Chasity Outten/Extender: Frederick Peers in Treatment: 34 Clinic Level of Care Assessment Items TOOL 4 Quantity  Score []  - 0 Use when only an EandM is performed on FOLLOW-UP visit ASSESSMENTS - Nursing Assessment / Reassessment X- 1 10 Reassessment of Co-morbidities (includes updates in patient status) X- 1 5 Reassessment of Adherence to Treatment Plan ASSESSMENTS - Wound and Skin A ssessment / Reassessment X - Simple Wound Assessment / Reassessment - one wound 1 5 []  - 0 Complex Wound Assessment / Reassessment - multiple wounds []  - 0 Dermatologic / Skin Assessment (not related to wound area) ASSESSMENTS - Focused Assessment []  - 0 Circumferential Edema Measurements - multi extremities []  - 0 Nutritional Assessment / Counseling / Intervention []  - 0 Lower Extremity Assessment (monofilament, tuning fork, pulses) []  - 0 Peripheral Arterial Disease Assessment (using hand held doppler) ASSESSMENTS - Ostomy and/or Continence Assessment and Care []  - 0 Incontinence Assessment and Management []  - 0 Ostomy Care Assessment and Management (repouching, etc.) PROCESS - Coordination of Care X - Simple Patient / Family Education for ongoing care 1 15 []  - 0 Complex (extensive) Patient / Family Education for ongoing care X- 1 10 Staff obtains Programmer, systems, Records, T Results / Process Orders est X- 1 10 Staff telephones HHA, Nursing Homes / Clarify orders / etc []  - 0 Routine Transfer to another Facility (non-emergent condition) []  - 0 Routine Hospital Admission (non-emergent condition) []  - 0 New Admissions / Biomedical engineer / Ordering NPWT Apligraf, etc. , []  - 0 Emergency Hospital Admission (emergent condition) X- 1 10 Simple Discharge Coordination []  - 0 Complex (extensive) Discharge Coordination PROCESS - Special Needs []  - 0 Pediatric / Minor Patient Management []  - 0 Isolation Patient Management []  - 0 Hearing / Language / Visual special needs []  - 0 Assessment of Community assistance (transportation, D/C planning, etc.) []  - 0 Additional assistance / Altered  mentation []  - 0 Support Surface(s) Assessment (bed, cushion, seat, etc.) INTERVENTIONS - Wound Cleansing / Measurement X - Simple Wound Cleansing - one wound 1 5 []  -  0 Complex Wound Cleansing - multiple wounds X- 1 5 Wound Imaging (photographs - any number of wounds) []  - 0 Wound Tracing (instead of photographs) X- 1 5 Simple Wound Measurement - one wound []  - 0 Complex Wound Measurement - multiple wounds INTERVENTIONS - Wound Dressings X - Small Wound Dressing one or multiple wounds 1 10 []  - 0 Medium Wound Dressing one or multiple wounds []  - 0 Large Wound Dressing one or multiple wounds X- 1 5 Application of Medications - topical []  - 0 Application of Medications - injection INTERVENTIONS - Miscellaneous []  - 0 External ear exam []  - 0 Specimen Collection (cultures, biopsies, blood, body fluids, etc.) []  - 0 Specimen(s) / Culture(s) sent or taken to Lab for analysis X- 1 10 Patient Transfer (multiple staff / Civil Service fast streamer / Similar devices) []  - 0 Simple Staple / Suture removal (25 or less) []  - 0 Complex Staple / Suture removal (26 or more) []  - 0 Hypo / Hyperglycemic Management (close monitor of Blood Glucose) []  - 0 Ankle / Brachial Index (ABI) - do not check if billed separately X- 1 5 Vital Signs Has the patient been seen at the hospital within the last three years: Yes Total Score: 110 Level Of Care: New/Established - Level 3 Electronic Signature(s) Signed: 06/13/2020 5:58:22 PM By: Baruch Gouty RN, BSN Entered By: Baruch Gouty on 06/13/2020 13:40:04 -------------------------------------------------------------------------------- Encounter Discharge Information Details Patient Name: Date of Service: [address] [name] 06/13/2020 1:15 PM Medical Record Number: [id number] Patient Account Number: [id number] Date of Birth/Sex: Treating RN: [birth year] (86 y.o. [name] Primary Care Aleda Madl: PA Haig Prophet, [place] Other Clinician: Referring  Oree Mirelez: Treating Sharea Guinther/Extender: Frederick Peers in Treatment: 34 Encounter Discharge Information Items Discharge Condition: Stable Ambulatory Status: Wheelchair Discharge Destination: Home Transportation: Private Auto Accompanied By: daughter Schedule Follow-up Appointment: Yes Clinical Summary of Care: Electronic Signature(s) Signed: 06/13/2020 5:09:42 PM By: Deon Pilling Entered By: Deon Pilling on 06/13/2020 15:31:12 -------------------------------------------------------------------------------- Multi Wound Chart Details Patient Name: Date of Service: [facility] FRAZIER, BALFOUR 06/13/2020 1:15 PM Medical Record Number: [id number] Patient Account Number: [id number] Date of Birth/Sex: Treating RN: 1921-09-27 (86 y.o. [name] Primary Care Lilburn Straw: PA Haig Prophet, [place] Other Clinician: Referring Kurstyn Larios: Treating Lineth Thielke/Extender: Frederick Peers in Treatment: 34 Vital Signs Height(in): 71 Pulse(bpm): 73 Weight(lbs): 175 Blood Pressure(mmHg): 146/84 Body Mass Index(BMI): 24 Temperature(F): 98.6 Respiratory Rate(breaths/min): 18 Photos: [1:No Photos Sacrum] [N/A:N/A N/A] Wound Location: [1:Pressure Injury] [N/A:N/A] Wounding Event: [1:Pressure Ulcer] [N/A:N/A] Primary Etiology: [1:Glaucoma, Chronic Obstructive] [N/A:N/A] Comorbid History: [1:Pulmonary Disease (COPD), Coronary Artery Disease, Hypertension, Type II Diabetes, Osteoarthritis, Dementia 09/03/2019] [N/A:N/A] Date Acquired: [1:34] [N/A:N/A] Weeks of Treatment: [1:Open] [N/A:N/A] Wound Status: [1:3x3.2x2.5] [N/A:N/A] Measurements L x W x D (cm) [1:7.54] [N/A:N/A] A (cm) : rea [1:18.85] [N/A:N/A] Volume (cm) : [1:13.50%] [N/A:N/A] % Reduction in A rea: [1:-27.20%] [N/A:N/A] % Reduction in Volume: [1:12] Starting Position 1 (o'clock): [1:6] Ending Position 1 (o'clock): [1:4] Maximum Distance 1 (cm): [1:Yes] [N/A:N/A] Undermining: [1:Category/Stage IV]  [N/A:N/A] Classification: [1:Medium] [N/A:N/A] Exudate A mount: [1:Serosanguineous] [N/A:N/A] Exudate Type: [1:red, brown] [N/A:N/A] Exudate Color: [1:Well defined, not attached] [N/A:N/A] Wound Margin: [1:Medium (34-66%)] [N/A:N/A] Granulation A mount: [1:Red] [N/A:N/A] Granulation Quality: [1:Medium (34-66%)] [N/A:N/A] Necrotic A mount: [1:Fat Layer (Subcutaneous Tissue): Yes N/A] Exposed Structures: [1:Bone: Yes Fascia: No Tendon: No Muscle: No Joint: No None] [N/A:N/A] Treatment Notes Electronic Signature(s) Signed: 06/13/2020 5:58:22 PM By: Baruch Gouty RN, BSN Signed: 06/16/2020 9:07:52 AM By: Linton Ham MD Entered By: Linton Ham  on 06/13/2020 13:54:26 -------------------------------------------------------------------------------- [place] Details Patient Name: Date of Service: KELLEY, KNOTH 06/13/2020 1:15 PM Medical Record Number: [id number] Patient Account Number: [id number] Date of Birth/Sex: Treating RN: 10-02-1921 (86 y.o. [name] Primary Care Daryn Pisani: PA Haig Prophet, [place] Other Clinician: Referring Radiance Deady: Treating Favor Hackler/Extender: Frederick Peers in Treatment: 34 Active Inactive Pressure Nursing Diagnoses: Knowledge deficit related to causes and risk factors for pressure ulcer development Knowledge deficit related to management of pressures ulcers Goals: Patient/caregiver will verbalize understanding of pressure ulcer management Date Initiated: 10/17/2019 Target Resolution Date: 06/20/2020 Goal Status: Active Interventions: Assess: immobility, friction, shearing, incontinence upon admission and as needed Assess offloading mechanisms upon admission and as needed Assess potential for pressure ulcer upon admission and as needed Provide education on pressure ulcers Notes: Wound/Skin Impairment Nursing Diagnoses: Impaired tissue integrity Knowledge deficit related to ulceration/compromised skin  integrity Goals: Patient/caregiver will verbalize understanding of skin care regimen Date Initiated: 10/17/2019 Target Resolution Date: 06/20/2020 Goal Status: Active Ulcer/skin breakdown will have a volume reduction of 30% by week 4 Date Initiated: 10/17/2019 Date Inactivated: 12/28/2019 Target Resolution Date: 12/28/2019 Goal Status: Unmet Unmet Reason: chronic wound Interventions: Assess patient/caregiver ability to obtain necessary supplies Assess patient/caregiver ability to perform ulcer/skin care regimen upon admission and as needed Assess ulceration(s) every visit Provide education on ulcer and skin care Treatment Activities: Skin care regimen initiated : 10/17/2019 Topical wound management initiated : 10/17/2019 Notes: Electronic Signature(s) Signed: 06/13/2020 5:58:22 PM By: Baruch Gouty RN, BSN Entered By: Baruch Gouty on 06/13/2020 13:37:58 -------------------------------------------------------------------------------- Pain Assessment Details Patient Name: Date of Service: KAID, SEEBERGER 06/13/2020 1:15 PM Medical Record Number: [id number] Patient Account Number: [id number] Date of Birth/Sex: Treating RN: 07-01-1922 (86 y.o. Oval Linsey Primary Care Jeison Delpilar: PA Haig [place], [place] Other Clinician: Referring Kassie Keng: Treating Nupur Hohman/Extender: Frederick Peers in Treatment: 34 Active Problems Location of Pain Severity and Description of Pain Patient Has Paino No Site Locations Pain Management and Medication Current Pain Management: Electronic Signature(s) Signed: 06/13/2020 5:42:44 PM By: Carlene Coria RN Entered By: Carlene Coria on 06/13/2020 13:25:42 -------------------------------------------------------------------------------- Patient/Caregiver Education Details Patient Name: Date of Service: [facility] 11/12/2021andnbsp1:15 PM Medical Record Number: [id number] Patient Account Number: [id number] Date of Birth/Gender: Treating  RN: Dec 01, 1921 (86 y.o. [name] Primary Care Physician: PA Haig Prophet, [place] Other Clinician: Referring Physician: Treating Physician/Extender: Frederick Peers in Treatment: 34 Education Assessment Education Provided To: Patient Education Topics Provided Pressure: Methods: Explain/Verbal Responses: Reinforcements needed, State content correctly Wound/Skin Impairment: Methods: Explain/Verbal Responses: Reinforcements needed, State content correctly Electronic Signature(s) Signed: 06/13/2020 5:58:22 PM By: Baruch Gouty RN, BSN Entered By: Baruch Gouty on 06/13/2020 13:38:22 -------------------------------------------------------------------------------- Wound Assessment Details Patient Name: Date of Service: [facility] THORNE, WIRZ 06/13/2020 1:15 PM Medical Record Number: [id number] Patient Account Number: [id number] Date of Birth/Sex: Treating RN: Jul 27, 1922 (86 y.o. [name]) Carlene Coria Primary Care Elzia Hott: PA Haig [place], [place] Other Clinician: Referring Rayanna Matusik: Treating Ronalee Scheunemann/Extender: Frederick Peers in Treatment: 34 Wound Status Wound Number: 1 Primary Pressure Ulcer Etiology: Wound Location: Sacrum Wound Open Wounding Event: Pressure Injury Status: Date Acquired: 09/03/2019 Comorbid Glaucoma, Chronic Obstructive Pulmonary Disease (COPD), Weeks Of Treatment: 34 History: Coronary Artery Disease, Hypertension, Type II Diabetes, Clustered Wound: No Osteoarthritis, Dementia Wound Measurements Length: (cm) 3 Width: (cm) 3.2 Depth: (cm) 2.5 Area: (cm) 7.54 Volume: (cm) 18.85 % Reduction in Area: 13.5% % Reduction in Volume: -27.2% Epithelialization: None Tunneling: No Undermining: Yes Starting Position (o'clock): 12  Ending Position (o'clock): 6 Maximum Distance: (cm) 4 Wound Description Classification: Category/Stage IV Wound Margin: Well defined, not attached Exudate Amount: Medium Exudate Type: Serosanguineous Exudate  Color: red, brown Foul Odor After Cleansing: No Slough/Fibrino Yes Wound Bed Granulation Amount: Medium (34-66%) Exposed Structure Granulation Quality: Red Fascia Exposed: No Necrotic Amount: Medium (34-66%) Fat Layer (Subcutaneous Tissue) Exposed: Yes Necrotic Quality: Adherent Slough Tendon Exposed: No Muscle Exposed: No Joint Exposed: No Bone Exposed: Yes Treatment Notes Wound #1 (Sacrum) 1. Cleanse With Wound Cleanser 2. Periwound Care Skin Prep 3. Primary Dressing Applied Calcium Alginate Ag 4. Secondary Dressing ABD Pad 5. Secured With [employer]) Signed: 06/13/2020 5:42:44 PM By: Carlene Coria RN Entered By: Carlene Coria on 06/13/2020 13:26:23 -------------------------------------------------------------------------------- Vitals Details Patient Name: Date of Service: [address] [place], [address] 06/13/2020 1:15 PM Medical Record Number: [id number] Patient Account Number: [id number] Date of Birth/Sex: Treating RN: 04/22/1922 (86 y.o. [name]) Carlene Coria Primary Care Trenna Kiely: PA Haig Prophet, [place] Other Clinician: Referring Barnes Florek: Treating Salvator Seppala/Extender: Frederick Peers in Treatment: 34 Vital Signs Time Taken: 13:25 Temperature (F): 98.6 Height (in): 71 Pulse (bpm): 73 Weight (lbs): 175 Respiratory Rate (breaths/min): 18 Body Mass Index (BMI): 24.4 Blood Pressure (mmHg): 146/84 Reference Range: 80 - 120 mg / dl Electronic Signature(s) Signed: 06/13/2020 5:42:44 PM By: Carlene Coria RN Entered By: Carlene Coria on 06/13/2020 13:25:35

## 2020-06-16 NOTE — Progress Notes (Signed)
[name] ([id number]) Visit Report for 06/13/2020 HPI Details Patient Name: Date of Service: [name], [name] 06/13/2020 1:15 PM Medical Record Number: [id number] Patient Account Number: [id number] Date of Birth/Sex: Treating RN: [birth year] (86 y.o. [name] Mention Primary Care Provider: PA Haig [name], [place] Other Clinician: Referring Provider: Treating Provider/Extender: [name] in Treatment: 34 History of Present Illness HPI Description: /A3/17/2021 upon evaluation today patient presents for initial inspection here in our clinic concerning issues that has been having with a wound in the sacral region as well as the left heel. Fortunately neither appears to be overtly and significantly infected there is some necrotic tissue noted at both locations however that is going to be required to be addressed. He does have a history of diabetes mellitus type 2, vascular dementia, and hypertension. The patient did have a stroke in November 2020 unfortunately he has not been the same since that time according to his daughter. Prior to that he was taking care of himself now he is 100% dependent. He is at home with his daughter who is present during the office visit today as well that is where I get the majority of the history from at this point. Patient does have a history of hypertension as well which does not appear to be too out of control today which is good news. He does have a hemoglobin A1c of 6.05 May 2019. They currently have been using a calcium alginate dressing to the sacrum and it sounds like Skin-Prep and a foam on the heel. 4/9; pressure ulcers on the sacral area and left heel. Been using Anasept wet-to-dry on the sacrum and Hydrofera Blue in the heel. He has home health family is helping with the dressing. It sounds as though they are fairly religious about offloading these areas that have pressure relief surfaces for the bed. Wounds look better 4/30; I have  not seen this patient in 3 weeks. We have been using Anasept wet-to-dry the sacrum and Hydrofera Blue on the heel. When I saw this 3 weeks ago the sacrum actually look quite good and I thought that we might be able to transition him to a collagen-based dressing today. 5/10; the since the patient was last here he was hospitalized from 5/5 through 12/07/2019. He was noted to have a pressure injury of the sacral region stage IV. MRI did not suggest discitis or osteomyelitis. There was nonspecific bilateral paraspinal muscle edema. Culture I did when he was here the last time showed E. coli I believe they are aware of this. Blood cultures were negative. His white count was 10.8 on admission 11.9 at discharge his comprehensive metabolic panel notable for an albumin of 2.4 BUN of 32 with creatinine of 0.03 hemoglobin was only 7.3 with a discharge hemoglobin of 8.1 after dropping to a hemoglobin of 6.6 He is back at home. Discharge antibiotic was Augmentin.[name] He was treated with IV Rocephin in the hospital. The patient was seen by palliative care I have not looked at this consult. They have been using dressings that they had left over at home I think most recently [facility]. According to his daughter the patient is eating fairly well 12/28/19-Patient is back at 2 weeks, the sacral wound is about the same, the right heel has healed and closed. We are using silver alginate to the sacral wound 6/18; we continue to follow the sacral wound. His heel wounds have closed. We have been using silver alginate to the sacrum 7/1; sacral wound.  We have been using silver collagen with backing wet-to-dry. No open wounds on the heels. 7/15; some improvement in the dimensions of the sacral wound including the undermining area we have been using silver collagen with backing wet-to-dry dressings. We had some thoughts about trying a wound VAC. He has home health. 03/13/20-Sacral wound looks intact with healthy base, surrounding  skin intact, no maceration, he did unfortunately develop to abrasion wounds on his first second and third toes dorsum of the Right foot this happened when his foot got trapped below the wheelchair Footguard 8/30; sacral wound has come down slightly especially the undermining. We have been using a wound VAC She has 2 necrotic areas on the tip of the right first toe and the right second toe dorsally. 10/8 Is been about 5 weeks since I have seen this man. Family tells me that the sacrum really deteriorated over the last week or 2. The blame home health for some back issues. Dimensions here are a lot larger than last time including the undermining and there is now exposed bone. He had an MRI done in May when he was hospitalized at this area that did not show osteomyelitis nevertheless there is been a fairly significant deterioration since I last saw this wound. He has small abrasions on the right dorsal second and third toes which look as though they are progressing towards closure 10/22; the patient came in 2 weeks ago with exposed bone. Sample showed osteomyelitis culture Enterococcus. Given his frailty and the state of his dementia I elected to try to treat him with oral Augmentin currently on 500 3 times daily. I am going to try to keep him on this for 6 weeks. He is eating and drinking well taking the antibiotics well. So far no overt side effects 11/12; unfortunately the patient was away from clinic longer than I thought he was going to be in they ran out of Augmentin about a week ago. I'm going to renew this but reduce the dose somewhat to twice a day. His wound looks better we've been using silver alginate. No exposed bone today. Electronic Signature(s) Signed: 06/16/2020 9:07:52 AM By: [name] Ham MD Entered By: [name] on 06/13/2020 13:55:20 -------------------------------------------------------------------------------- Physical Exam Details Patient Name: Date of Service: [name] 06/13/2020 1:15 PM Medical Record Number: [id number] Patient Account Number: [id number] Date of Birth/Sex: Treating RN: 10-29-1921 (86 y.o. [name] Mention Primary Care Provider: Other Clinician: PA Darnelle Spangle Referring Provider: Treating Provider/Extender: [name] in Treatment: 34 Constitutional Patient is hypertensive.. Pulse regular and within target range for patient.[name] Respirations regular, non-labored and within target range.. Temperature is normal and within the target range for the patient.[name] Appears in no distress. Psychiatric Advanced dementia. Notes Wound exam; sacral wound the granulation tissue looks healthy. Some minor improvements in the superior undermining there is no exposed bone no surrounding tenderness. Electronic Signature(s) Signed: 06/16/2020 9:07:52 AM By: [name] Ham MD Entered By: [name] on 06/13/2020 13:56:07 -------------------------------------------------------------------------------- Physician Orders Details Patient Name: Date of Service: [address] [place], [place] 06/13/2020 1:15 PM Medical Record Number: [id number] Patient Account Number: [id number] Date of Birth/Sex: Treating RN: 1921-10-19 (86 y.o. [name] Mention Primary Care Provider: PA Haig [name], [place] Other Clinician: Referring Provider: Treating Provider/Extender: [name] in Treatment: 34 Verbal / Phone Orders: No Diagnosis Coding ICD-10 Coding Code Description L89.154 Pressure ulcer of sacral region, stage 4 E11.621 Type 2 diabetes mellitus with foot ulcer L97.518 Non-pressure chronic ulcer of  other part of right foot with other specified severity M86.18 Other acute osteomyelitis, other site Follow-up Appointments Return appointment in 3 weeks. Dressing Change Frequency Wound #1 Sacrum Change dressing every day. - home health to change 3 times per week Wound Cleansing Wound #1 Sacrum Clean wound with Wound Cleanser  - or normal saline Primary Wound Dressing Wound #1 Sacrum lginate with Silver - pack into underminig Calcium A Secondary Dressing Wound #1 Sacrum ABD pad - or foam border Off-Loading Low air-loss mattress (Group 2) Turn and reposition every 2 hours Other: - float heels with pillows under calves in bed [facility] for wound care. - [facility] Patient Medications llergies: lisinopril A Notifications Medication Indication Start End 06/13/2020 Augmentin DOSE oral 250 mg/5 mL-62.5 mg/5 mL suspension for reconstitution - suspension for reconstitution oral 500 mg =51ml bid for 2 weeks Electronic Signature(s) Signed: 06/13/2020 5:58:22 PM By: Baruch Gouty RN, BSN Signed: 06/16/2020 9:07:52 AM By: [name] Ham MD Previous Signature: 06/13/2020 1:44:16 PM Version By: [name] Ham MD Entered By: Baruch Gouty on 06/13/2020 17:29:41 -------------------------------------------------------------------------------- Problem List Details Patient Name: Date of Service: [facility] OLSEN, [place]CCUT[place] 06/13/2020 1:15 PM Medical Record Number: [id number] Patient Account Number: [id number] Date of Birth/Sex: Treating RN: 03/20/1922 (86 y.o. [name] Mention Primary Care Provider: PA Haig [name], [place] Other Clinician: Referring Provider: Treating Provider/Extender: [name] in Treatment: 34 Active Problems ICD-10 Encounter Code Description Active Date MDM Diagnosis L89.154 Pressure ulcer of sacral region, stage 4 10/17/2019 No Yes M86.18 Other acute osteomyelitis, other site 05/23/2020 No Yes Inactive Problems ICD-10 Code Description Active Date Inactive Date L97.522 Non-pressure chronic ulcer of other part of left foot with fat layer exposed 10/17/2019 10/17/2019 F01.50 Vascular dementia without behavioral disturbance 10/17/2019 10/17/2019 I10 Essential (primary) hypertension 10/17/2019 10/17/2019 L03.031 Cellulitis of right toe 03/31/2020  03/31/2020 E11.621 Type 2 diabetes mellitus with foot ulcer 10/17/2019 10/17/2019 L97.518 Non-pressure chronic ulcer of other part of right foot with other specified severity 03/31/2020 03/31/2020 Resolved Problems Electronic Signature(s) Signed: 06/16/2020 9:07:52 AM By: [name] Ham MD Entered By: [name] on 06/13/2020 13:54:08 -------------------------------------------------------------------------------- Progress Note Details Patient Name: Date of Service: [address] [place], [place] 06/13/2020 1:15 PM Medical Record Number: [id number] Patient Account Number: [id number] Date of Birth/Sex: Treating RN: [birth year] (86 y.o. [name] Mention Primary Care Provider: PA Haig [name], [place] Other Clinician: Referring Provider: Treating Provider/Extender: [name] in Treatment: 34 Subjective History of Present Illness (HPI) /A3/17/2021 upon evaluation today patient presents for initial inspection here in our clinic concerning issues that has been having with a wound in the sacral region as well as the left heel. Fortunately neither appears to be overtly and significantly infected there is some necrotic tissue noted at both locations however that is going to be required to be addressed. He does have a history of diabetes mellitus type 2, vascular dementia, and hypertension. The patient did have a stroke in November 2020 unfortunately he has not been the same since that time according to his daughter. Prior to that he was taking care of himself now he is 100% dependent. He is at home with his daughter who is present during the office visit today as well that is where I get the majority of the history from at this point. Patient does have a history of hypertension as well which does not appear to be too out of control today which is good news. He does have a hemoglobin A1c of 6.05 May 2019.  They currently have been using a calcium alginate dressing to the sacrum and it sounds like  Skin-Prep and a foam on the heel. 4/9; pressure ulcers on the sacral area and left heel. Been using Anasept wet-to-dry on the sacrum and Hydrofera Blue in the heel. He has home health family is helping with the dressing. It sounds as though they are fairly religious about offloading these areas that have pressure relief surfaces for the bed. Wounds look better 4/30; I have not seen this patient in 3 weeks. We have been using Anasept wet-to-dry the sacrum and Hydrofera Blue on the heel. When I saw this 3 weeks ago the sacrum actually look quite good and I thought that we might be able to transition him to a collagen-based dressing today. 5/10; the since the patient was last here he was hospitalized from 5/5 through 12/07/2019. He was noted to have a pressure injury of the sacral region stage IV. MRI did not suggest discitis or osteomyelitis. There was nonspecific bilateral paraspinal muscle edema. Culture I did when he was here the last time showed E. coli I believe they are aware of this. Blood cultures were negative. His white count was 10.8 on admission 11.9 at discharge his comprehensive metabolic panel notable for an albumin of 2.4 BUN of 32 with creatinine of 0.03 hemoglobin was only 7.3 with a discharge hemoglobin of 8.1 after dropping to a hemoglobin of 6.6 He is back at home. Discharge antibiotic was Augmentin.[name] He was treated with IV Rocephin in the hospital. The patient was seen by palliative care I have not looked at this consult. They have been using dressings that they had left over at home I think most recently [facility]. According to his daughter the patient is eating fairly well 12/28/19-Patient is back at 2 weeks, the sacral wound is about the same, the right heel has healed and closed. We are using silver alginate to the sacral wound 6/18; we continue to follow the sacral wound. His heel wounds have closed. We have been using silver alginate to the sacrum 7/1; sacral wound. We  have been using silver collagen with backing wet-to-dry. No open wounds on the heels. 7/15; some improvement in the dimensions of the sacral wound including the undermining area we have been using silver collagen with backing wet-to-dry dressings. We had some thoughts about trying a wound VAC. He has home health. 03/13/20-Sacral wound looks intact with healthy base, surrounding skin intact, no maceration, he did unfortunately develop to abrasion wounds on his first second and third toes dorsum of the Right foot this happened when his foot got trapped below the wheelchair Footguard 8/30; sacral wound has come down slightly especially the undermining. We have been using a wound VAC ooShe has 2 necrotic areas on the tip of the right first toe and the right second toe dorsally. 10/8 Is been about 5 weeks since I have seen this man. Family tells me that the sacrum really deteriorated over the last week or 2. The blame home health for some back issues. Dimensions here are a lot larger than last time including the undermining and there is now exposed bone. He had an MRI done in May when he was hospitalized at this area that did not show osteomyelitis nevertheless there is been a fairly significant deterioration since I last saw this wound. He has small abrasions on the right dorsal second and third toes which look as though they are progressing towards closure 10/22; the patient came in  2 weeks ago with exposed bone. Sample showed osteomyelitis culture Enterococcus. Given his frailty and the state of his dementia I elected to try to treat him with oral Augmentin currently on 500 3 times daily. I am going to try to keep him on this for 6 weeks. He is eating and drinking well taking the antibiotics well. So far no overt side effects 11/12; unfortunately the patient was away from clinic longer than I thought he was going to be in they ran out of Augmentin about a week ago. I'm going to renew this but reduce the  dose somewhat to twice a day. His wound looks better we've been using silver alginate. No exposed bone today. Objective Constitutional Patient is hypertensive.. Pulse regular and within target range for patient.[name] Respirations regular, non-labored and within target range.. Temperature is normal and within the target range for the patient.[name] Appears in no distress. Vitals Time Taken: 1:25 PM, Height: 71 in, Weight: 175 lbs, BMI: 24.4, Temperature: 98.6 F, Pulse: 73 bpm, Respiratory Rate: 18 breaths/min, Blood Pressure: 146/84 mmHg. Psychiatric Advanced dementia. General Notes: Wound exam; sacral wound the granulation tissue looks healthy. Some minor improvements in the superior undermining there is no exposed bone no surrounding tenderness. Integumentary (Hair, Skin) Wound #1 status is Open. Original cause of wound was Pressure Injury. The wound is located on the Sacrum. The wound measures 3cm length x 3.2cm width x 2.5cm depth; 7.54cm^2 area and 18.85cm^3 volume. There is bone and Fat Layer (Subcutaneous Tissue) exposed. There is no tunneling noted, however, there is undermining starting at 12:00 and ending at 6:00 with a maximum distance of 4cm. There is a medium amount of serosanguineous drainage noted. The wound margin is well defined and not attached to the wound base. There is medium (34-66%) red granulation within the wound bed. There is a medium (34-66%) amount of necrotic tissue within the wound bed including Adherent Slough. Assessment Active Problems ICD-10 Pressure ulcer of sacral region, stage 4 Other acute osteomyelitis, other site Plan Follow-up Appointments: Return appointment in 3 weeks. Dressing Change Frequency: Wound #1 Sacrum: Change dressing every day. - home health to change 3 times per week Wound Cleansing: Wound #1 Sacrum: Clean wound with Wound Cleanser - or normal saline Primary Wound Dressing: Wound #1 Sacrum: Calcium Alginate with Silver - pack into  underminig Secondary Dressing: Wound #1 Sacrum: ABD pad - or foam border Off-Loading: Low air-loss mattress (Group 2) Turn and reposition every 2 hours Other: - float heels with pillows under calves in bed Home Health: [place] [facility] for wound care. - [facility] The following medication(s) was prescribed: Augmentin oral 250 mg/5 mL-62.5 mg/5 mL suspension for reconstitution suspension for reconstitution oral 500 mg =57ml bid for 2 weeks starting 06/13/2020 1. I gave him Augmentin 500 mg twice daily for 2 weeks with 1 renewal. I think the original Augmentin that I gave him helped I gave him a higher dose then I'm going to lower it today. 2. Continue silver alginate 3. Might do better with silver collagen will look at that next time. 4. I did not think because of his dementia he was a candidate for intravenous drugs. Nor do I really think he would be a candidate for a wound VAC to for the same reason Electronic Signature(s) Signed: 06/13/2020 5:58:22 PM By: Baruch Gouty RN, BSN Signed: 06/16/2020 9:07:52 AM By: [name] Ham MD Entered By: Baruch Gouty on 06/13/2020 17:29:50 -------------------------------------------------------------------------------- SuperBill Details Patient Name: Date of Service: [facility],  Jvon 06/13/2020 Medical Record Number: [id number] Patient Account Number: [id number] Date of Birth/Sex: Treating RN: 29-Jan-1922 (86 y.o. [name] Mention Primary Care Provider: PA Haig [name], [place] Other Clinician: Referring Provider: Treating Provider/Extender: [name] in Treatment: 34 Diagnosis Coding ICD-10 Codes Code Description L89.154 Pressure ulcer of sacral region, stage 4 E11.621 Type 2 diabetes mellitus with foot ulcer L97.518 Non-pressure chronic ulcer of other part of right foot with other specified severity M86.18 Other acute osteomyelitis, other site Facility Procedures CPT4 Code: [id number] Description:  99213 - WOUND CARE VISIT-LEV 3 EST PT Modifier: Quantity: 1 Physician Procedures : CPT4 Code Description Modifier [id number] - WC PHYS LEVEL 3 - EST PT ICD-10 Diagnosis Description L89.154 Pressure ulcer of sacral region, stage 4 M86.18 Other acute osteomyelitis, other site Quantity: 1 Electronic Signature(s) Signed: 06/16/2020 9:07:52 AM By: [name] Ham MD Entered By: [name] on 06/13/2020 13:57:43

## 2020-06-17 DIAGNOSIS — I5032 Chronic diastolic (congestive) heart failure: Secondary | ICD-10-CM | POA: Diagnosis not present

## 2020-06-17 DIAGNOSIS — Z7901 Long term (current) use of anticoagulants: Secondary | ICD-10-CM | POA: Diagnosis not present

## 2020-06-17 DIAGNOSIS — I2699 Other pulmonary embolism without acute cor pulmonale: Secondary | ICD-10-CM | POA: Diagnosis not present

## 2020-06-17 DIAGNOSIS — L89154 Pressure ulcer of sacral region, stage 4: Secondary | ICD-10-CM | POA: Diagnosis not present

## 2020-06-17 DIAGNOSIS — I69351 Hemiplegia and hemiparesis following cerebral infarction affecting right dominant side: Secondary | ICD-10-CM | POA: Diagnosis not present

## 2020-06-17 DIAGNOSIS — I130 Hypertensive heart and chronic kidney disease with heart failure and stage 1 through stage 4 chronic kidney disease, or unspecified chronic kidney disease: Secondary | ICD-10-CM | POA: Diagnosis not present

## 2020-06-17 DIAGNOSIS — E1122 Type 2 diabetes mellitus with diabetic chronic kidney disease: Secondary | ICD-10-CM | POA: Diagnosis not present

## 2020-06-17 DIAGNOSIS — N183 Chronic kidney disease, stage 3 unspecified: Secondary | ICD-10-CM | POA: Diagnosis not present

## 2020-06-17 DIAGNOSIS — F0391 Unspecified dementia with behavioral disturbance: Secondary | ICD-10-CM | POA: Diagnosis not present

## 2020-06-19 DIAGNOSIS — Z7901 Long term (current) use of anticoagulants: Secondary | ICD-10-CM | POA: Diagnosis not present

## 2020-06-19 DIAGNOSIS — I130 Hypertensive heart and chronic kidney disease with heart failure and stage 1 through stage 4 chronic kidney disease, or unspecified chronic kidney disease: Secondary | ICD-10-CM | POA: Diagnosis not present

## 2020-06-19 DIAGNOSIS — I2699 Other pulmonary embolism without acute cor pulmonale: Secondary | ICD-10-CM | POA: Diagnosis not present

## 2020-06-19 DIAGNOSIS — F0391 Unspecified dementia with behavioral disturbance: Secondary | ICD-10-CM | POA: Diagnosis not present

## 2020-06-19 DIAGNOSIS — I69351 Hemiplegia and hemiparesis following cerebral infarction affecting right dominant side: Secondary | ICD-10-CM | POA: Diagnosis not present

## 2020-06-19 DIAGNOSIS — I5032 Chronic diastolic (congestive) heart failure: Secondary | ICD-10-CM | POA: Diagnosis not present

## 2020-06-19 DIAGNOSIS — N183 Chronic kidney disease, stage 3 unspecified: Secondary | ICD-10-CM | POA: Diagnosis not present

## 2020-06-19 DIAGNOSIS — L89154 Pressure ulcer of sacral region, stage 4: Secondary | ICD-10-CM | POA: Diagnosis not present

## 2020-06-19 DIAGNOSIS — E1122 Type 2 diabetes mellitus with diabetic chronic kidney disease: Secondary | ICD-10-CM | POA: Diagnosis not present

## 2020-06-23 DIAGNOSIS — L89154 Pressure ulcer of sacral region, stage 4: Secondary | ICD-10-CM | POA: Diagnosis not present

## 2020-06-23 DIAGNOSIS — F0391 Unspecified dementia with behavioral disturbance: Secondary | ICD-10-CM | POA: Diagnosis not present

## 2020-06-23 DIAGNOSIS — Z7901 Long term (current) use of anticoagulants: Secondary | ICD-10-CM | POA: Diagnosis not present

## 2020-06-23 DIAGNOSIS — I69351 Hemiplegia and hemiparesis following cerebral infarction affecting right dominant side: Secondary | ICD-10-CM | POA: Diagnosis not present

## 2020-06-23 DIAGNOSIS — I2699 Other pulmonary embolism without acute cor pulmonale: Secondary | ICD-10-CM | POA: Diagnosis not present

## 2020-06-23 DIAGNOSIS — I5032 Chronic diastolic (congestive) heart failure: Secondary | ICD-10-CM | POA: Diagnosis not present

## 2020-06-23 DIAGNOSIS — N183 Chronic kidney disease, stage 3 unspecified: Secondary | ICD-10-CM | POA: Diagnosis not present

## 2020-06-23 DIAGNOSIS — I130 Hypertensive heart and chronic kidney disease with heart failure and stage 1 through stage 4 chronic kidney disease, or unspecified chronic kidney disease: Secondary | ICD-10-CM | POA: Diagnosis not present

## 2020-06-23 DIAGNOSIS — E1122 Type 2 diabetes mellitus with diabetic chronic kidney disease: Secondary | ICD-10-CM | POA: Diagnosis not present

## 2020-06-27 DIAGNOSIS — Z7901 Long term (current) use of anticoagulants: Secondary | ICD-10-CM | POA: Diagnosis not present

## 2020-06-27 DIAGNOSIS — I5032 Chronic diastolic (congestive) heart failure: Secondary | ICD-10-CM | POA: Diagnosis not present

## 2020-06-27 DIAGNOSIS — I69351 Hemiplegia and hemiparesis following cerebral infarction affecting right dominant side: Secondary | ICD-10-CM | POA: Diagnosis not present

## 2020-06-27 DIAGNOSIS — I130 Hypertensive heart and chronic kidney disease with heart failure and stage 1 through stage 4 chronic kidney disease, or unspecified chronic kidney disease: Secondary | ICD-10-CM | POA: Diagnosis not present

## 2020-06-27 DIAGNOSIS — N183 Chronic kidney disease, stage 3 unspecified: Secondary | ICD-10-CM | POA: Diagnosis not present

## 2020-06-27 DIAGNOSIS — I2699 Other pulmonary embolism without acute cor pulmonale: Secondary | ICD-10-CM | POA: Diagnosis not present

## 2020-06-27 DIAGNOSIS — L89154 Pressure ulcer of sacral region, stage 4: Secondary | ICD-10-CM | POA: Diagnosis not present

## 2020-06-27 DIAGNOSIS — F0391 Unspecified dementia with behavioral disturbance: Secondary | ICD-10-CM | POA: Diagnosis not present

## 2020-06-27 DIAGNOSIS — E1122 Type 2 diabetes mellitus with diabetic chronic kidney disease: Secondary | ICD-10-CM | POA: Diagnosis not present

## 2020-06-30 DIAGNOSIS — I2699 Other pulmonary embolism without acute cor pulmonale: Secondary | ICD-10-CM | POA: Diagnosis not present

## 2020-06-30 DIAGNOSIS — L89154 Pressure ulcer of sacral region, stage 4: Secondary | ICD-10-CM | POA: Diagnosis not present

## 2020-06-30 DIAGNOSIS — Z7901 Long term (current) use of anticoagulants: Secondary | ICD-10-CM | POA: Diagnosis not present

## 2020-06-30 DIAGNOSIS — E1122 Type 2 diabetes mellitus with diabetic chronic kidney disease: Secondary | ICD-10-CM | POA: Diagnosis not present

## 2020-06-30 DIAGNOSIS — I5032 Chronic diastolic (congestive) heart failure: Secondary | ICD-10-CM | POA: Diagnosis not present

## 2020-06-30 DIAGNOSIS — I130 Hypertensive heart and chronic kidney disease with heart failure and stage 1 through stage 4 chronic kidney disease, or unspecified chronic kidney disease: Secondary | ICD-10-CM | POA: Diagnosis not present

## 2020-06-30 DIAGNOSIS — I69351 Hemiplegia and hemiparesis following cerebral infarction affecting right dominant side: Secondary | ICD-10-CM | POA: Diagnosis not present

## 2020-06-30 DIAGNOSIS — N183 Chronic kidney disease, stage 3 unspecified: Secondary | ICD-10-CM | POA: Diagnosis not present

## 2020-06-30 DIAGNOSIS — F0391 Unspecified dementia with behavioral disturbance: Secondary | ICD-10-CM | POA: Diagnosis not present

## 2020-07-04 ENCOUNTER — Encounter (HOSPITAL_BASED_OUTPATIENT_CLINIC_OR_DEPARTMENT_OTHER): Payer: Medicare HMO | Admitting: Internal Medicine

## 2020-07-04 DIAGNOSIS — I2699 Other pulmonary embolism without acute cor pulmonale: Secondary | ICD-10-CM | POA: Diagnosis not present

## 2020-07-04 DIAGNOSIS — Z7901 Long term (current) use of anticoagulants: Secondary | ICD-10-CM | POA: Diagnosis not present

## 2020-07-04 DIAGNOSIS — I69351 Hemiplegia and hemiparesis following cerebral infarction affecting right dominant side: Secondary | ICD-10-CM | POA: Diagnosis not present

## 2020-07-04 DIAGNOSIS — F0391 Unspecified dementia with behavioral disturbance: Secondary | ICD-10-CM | POA: Diagnosis not present

## 2020-07-04 DIAGNOSIS — N183 Chronic kidney disease, stage 3 unspecified: Secondary | ICD-10-CM | POA: Diagnosis not present

## 2020-07-04 DIAGNOSIS — I130 Hypertensive heart and chronic kidney disease with heart failure and stage 1 through stage 4 chronic kidney disease, or unspecified chronic kidney disease: Secondary | ICD-10-CM | POA: Diagnosis not present

## 2020-07-04 DIAGNOSIS — E1122 Type 2 diabetes mellitus with diabetic chronic kidney disease: Secondary | ICD-10-CM | POA: Diagnosis not present

## 2020-07-04 DIAGNOSIS — L89154 Pressure ulcer of sacral region, stage 4: Secondary | ICD-10-CM | POA: Diagnosis not present

## 2020-07-04 DIAGNOSIS — I5032 Chronic diastolic (congestive) heart failure: Secondary | ICD-10-CM | POA: Diagnosis not present

## 2020-07-08 ENCOUNTER — Encounter (HOSPITAL_BASED_OUTPATIENT_CLINIC_OR_DEPARTMENT_OTHER): Payer: Medicare HMO | Attending: Internal Medicine | Admitting: Internal Medicine

## 2020-07-08 ENCOUNTER — Other Ambulatory Visit: Payer: Self-pay

## 2020-07-08 DIAGNOSIS — E11621 Type 2 diabetes mellitus with foot ulcer: Secondary | ICD-10-CM | POA: Insufficient documentation

## 2020-07-08 DIAGNOSIS — L89154 Pressure ulcer of sacral region, stage 4: Secondary | ICD-10-CM | POA: Insufficient documentation

## 2020-07-08 DIAGNOSIS — I69351 Hemiplegia and hemiparesis following cerebral infarction affecting right dominant side: Secondary | ICD-10-CM | POA: Diagnosis not present

## 2020-07-08 DIAGNOSIS — I130 Hypertensive heart and chronic kidney disease with heart failure and stage 1 through stage 4 chronic kidney disease, or unspecified chronic kidney disease: Secondary | ICD-10-CM | POA: Diagnosis not present

## 2020-07-08 DIAGNOSIS — F0150 Vascular dementia without behavioral disturbance: Secondary | ICD-10-CM | POA: Insufficient documentation

## 2020-07-08 DIAGNOSIS — E1122 Type 2 diabetes mellitus with diabetic chronic kidney disease: Secondary | ICD-10-CM | POA: Diagnosis not present

## 2020-07-08 DIAGNOSIS — F0391 Unspecified dementia with behavioral disturbance: Secondary | ICD-10-CM | POA: Diagnosis not present

## 2020-07-08 DIAGNOSIS — E1151 Type 2 diabetes mellitus with diabetic peripheral angiopathy without gangrene: Secondary | ICD-10-CM | POA: Insufficient documentation

## 2020-07-08 DIAGNOSIS — I5032 Chronic diastolic (congestive) heart failure: Secondary | ICD-10-CM | POA: Diagnosis not present

## 2020-07-08 DIAGNOSIS — Z7901 Long term (current) use of anticoagulants: Secondary | ICD-10-CM | POA: Diagnosis not present

## 2020-07-08 DIAGNOSIS — I10 Essential (primary) hypertension: Secondary | ICD-10-CM | POA: Insufficient documentation

## 2020-07-08 DIAGNOSIS — N183 Chronic kidney disease, stage 3 unspecified: Secondary | ICD-10-CM | POA: Diagnosis not present

## 2020-07-08 DIAGNOSIS — I2699 Other pulmonary embolism without acute cor pulmonale: Secondary | ICD-10-CM | POA: Diagnosis not present

## 2020-07-08 NOTE — Progress Notes (Signed)
RIHAAN, BARRACK ([id number]) Visit Report for 07/08/2020 Arrival Information Details Patient Name: Date of Service: [name], [name] 07/08/2020 2:15 PM Medical Record Number: [id number] Patient Account Number: [id number] Date of Birth/Sex: Treating RN: 1922-06-02 (86 y.o. [name] Primary Care Lennard Capek: PA Haig Prophet, [place] Other Clinician: Referring Lindamarie Maclachlan: Treating Finnean Cerami/Extender: Frederick Peers in Treatment: 53 Visit Information History Since Last Visit Added or deleted any medications: No Patient Arrived: Wheel Chair Any new allergies or adverse reactions: No Arrival Time: 15:08 Had a fall or experienced change in No Accompanied By: dgt activities of daily living that may affect Transfer Assistance: Harrel Lemon Lift risk of falls: Patient Identification Verified: Yes Signs or symptoms of abuse/neglect since last visito No Secondary Verification Process Completed: Yes Hospitalized since last visit: No Patient Requires Transmission-Based Precautions: No Implantable device outside of the clinic excluding No Patient Has Alerts: No cellular tissue based products placed in the center since last visit: Has Dressing in Place as Prescribed: Yes Pain Present Now: No Electronic Signature(s) Signed: 07/08/2020 5:33:51 PM By: Baruch Gouty RN, BSN Entered By: Baruch Gouty on 07/08/2020 15:08:30 -------------------------------------------------------------------------------- Clinic Level of Care Assessment Details Patient Name: Date of Service: [facility] [name], [name] 07/08/2020 2:15 PM Medical Record Number: [id number] Patient Account Number: [id number] Date of Birth/Sex: Treating RN: March 15, 1922 (86 y.o. [name]) Carlene Coria Primary Care Lindsey Hommel: PA Haig Prophet, [place] Other Clinician: Referring Neshawn Aird: Treating Omunique Pederson/Extender: Frederick Peers in Treatment: 30 Clinic Level of Care Assessment Items TOOL 4 Quantity Score X- 1 0 Use when only an EandM is  performed on FOLLOW-UP visit ASSESSMENTS - Nursing Assessment / Reassessment X- 1 10 Reassessment of Co-morbidities (includes updates in patient status) X- 1 5 Reassessment of Adherence to Treatment Plan ASSESSMENTS - Wound and Skin A ssessment / Reassessment X - Simple Wound Assessment / Reassessment - one wound 1 5 []  - 0 Complex Wound Assessment / Reassessment - multiple wounds []  - 0 Dermatologic / Skin Assessment (not related to wound area) ASSESSMENTS - Focused Assessment []  - 0 Circumferential Edema Measurements - multi extremities []  - 0 Nutritional Assessment / Counseling / Intervention []  - 0 Lower Extremity Assessment (monofilament, tuning fork, pulses) []  - 0 Peripheral Arterial Disease Assessment (using hand held doppler) ASSESSMENTS - Ostomy and/or Continence Assessment and Care []  - 0 Incontinence Assessment and Management []  - 0 Ostomy Care Assessment and Management (repouching, etc.) PROCESS - Coordination of Care X - Simple Patient / Family Education for ongoing care 1 15 []  - 0 Complex (extensive) Patient / Family Education for ongoing care X- 1 10 Staff obtains Programmer, systems, Records, T Results / Process Orders est []  - 0 Staff telephones HHA, Nursing Homes / Clarify orders / etc []  - 0 Routine Transfer to another Facility (non-emergent condition) []  - 0 Routine Hospital Admission (non-emergent condition) []  - 0 New Admissions / Biomedical engineer / Ordering NPWT Apligraf, etc. , []  - 0 Emergency Hospital Admission (emergent condition) X- 1 10 Simple Discharge Coordination []  - 0 Complex (extensive) Discharge Coordination PROCESS - Special Needs []  - 0 Pediatric / Minor Patient Management []  - 0 Isolation Patient Management []  - 0 Hearing / Language / Visual special needs []  - 0 Assessment of Community assistance (transportation, D/C planning, etc.) []  - 0 Additional assistance / Altered mentation []  - 0 Support Surface(s) Assessment  (bed, cushion, seat, etc.) INTERVENTIONS - Wound Cleansing / Measurement X - Simple Wound Cleansing - one wound 1 5 []  - 0 Complex Wound Cleansing -  multiple wounds X- 1 5 Wound Imaging (photographs - any number of wounds) []  - 0 Wound Tracing (instead of photographs) X- 1 5 Simple Wound Measurement - one wound []  - 0 Complex Wound Measurement - multiple wounds INTERVENTIONS - Wound Dressings []  - 0 Small Wound Dressing one or multiple wounds X- 1 15 Medium Wound Dressing one or multiple wounds []  - 0 Large Wound Dressing one or multiple wounds X- 1 5 Application of Medications - topical []  - 0 Application of Medications - injection INTERVENTIONS - Miscellaneous []  - 0 External ear exam []  - 0 Specimen Collection (cultures, biopsies, blood, body fluids, etc.) []  - 0 Specimen(s) / Culture(s) sent or taken to Lab for analysis []  - 0 Patient Transfer (multiple staff / Civil Service fast streamer / Similar devices) []  - 0 Simple Staple / Suture removal (25 or less) []  - 0 Complex Staple / Suture removal (26 or more) []  - 0 Hypo / Hyperglycemic Management (close monitor of Blood Glucose) []  - 0 Ankle / Brachial Index (ABI) - do not check if billed separately X- 1 5 Vital Signs Has the patient been seen at the hospital within the last three years: Yes Total Score: 95 Level Of Care: New/Established - Level 3 Electronic Signature(s) Signed: 07/08/2020 5:28:36 PM By: Carlene Coria RN Entered By: Carlene Coria on 07/08/2020 17:14:40 -------------------------------------------------------------------------------- Encounter Discharge Information Details Patient Name: Date of Service: [facility] [name], [name] 07/08/2020 2:15 PM Medical Record Number: [id number] Patient Account Number: [id number] Date of Birth/Sex: Treating RN: 1922/06/21 (87 y.o. [name] Primary Care Madalin Hughart: PA Haig Prophet, [place] Other Clinician: Referring Bryer Cozzolino: Treating Rody Keadle/Extender: Frederick Peers in  Treatment: 37 Encounter Discharge Information Items Discharge Condition: Stable Ambulatory Status: Wheelchair Discharge Destination: Home Transportation: Private Auto Accompanied By: daughter Schedule Follow-up Appointment: Yes Clinical Summary of Care: Electronic Signature(s) Signed: 07/08/2020 4:58:51 PM By: Deon Pilling Entered By: Deon Pilling on 07/08/2020 16:12:39 -------------------------------------------------------------------------------- Lower Extremity Assessment Details Patient Name: Date of Service: [name], [name] 07/08/2020 2:15 PM Medical Record Number: [id number] Patient Account Number: [id number] Date of Birth/Sex: Treating RN: 1922-07-01 (86 y.o. [name] Primary Care Vyctoria Dickman: PA TIENT, [place] Other Clinician: Referring Eleesha Purkey: Treating Kade Rickels/Extender: Frederick Peers in Treatment: 37 Electronic Signature(s) Signed: 07/08/2020 5:33:51 PM By: Baruch Gouty RN, BSN Entered By: Baruch Gouty on 07/08/2020 15:09:53 -------------------------------------------------------------------------------- Multi Wound Chart Details Patient Name: Date of Service: [facility] [name], [name] 07/08/2020 2:15 PM Medical Record Number: [id number] Patient Account Number: [id number] Date of Birth/Sex: Treating RN: [birth year] (86 y.o. [name]) Carlene Coria Primary Care Taje Littler: PA Haig Prophet, [place] Other Clinician: Referring Elsie Sakuma: Treating Celest Reitz/Extender: Frederick Peers in Treatment: 37 Vital Signs Height(in): 63 Pulse(bpm): 71 Weight(lbs): 175 Blood Pressure(mmHg): 117/67 Body Mass Index(BMI): 24 Temperature(F): 98.4 Respiratory Rate(breaths/min): 18 Photos: [1:No Photos Sacrum] [N/A:N/A N/A] Wound Location: [1:Pressure Injury] [N/A:N/A] Wounding Event: [1:Pressure Ulcer] [N/A:N/A] Primary Etiology: [1:Glaucoma, Chronic Obstructive] [N/A:N/A] Comorbid History: [1:Pulmonary Disease (COPD), Coronary Artery Disease, Hypertension, Type  II Diabetes, Osteoarthritis, Dementia 09/03/2019] [N/A:N/A] Date Acquired: [1:37] [N/A:N/A] Weeks of Treatment: [1:Open] [N/A:N/A] Wound Status: [1:2.5x3x2.2] [N/A:N/A] Measurements L x W x D (cm) [1:5.89] [N/A:N/A] A (cm) : rea [1:12.959] [N/A:N/A] Volume (cm) : [1:32.40%] [N/A:N/A] % Reduction in A rea: [1:12.60%] [N/A:N/A] % Reduction in Volume: [1:9] Starting Position 1 (o'clock): [1:5] Ending Position 1 (o'clock): [1:3.5] Maximum Distance 1 (cm): [1:Yes] [N/A:N/A] Undermining: [1:Category/Stage IV] [N/A:N/A] Classification: [1:Medium] [N/A:N/A] Exudate A mount: [1:Serosanguineous] [N/A:N/A] Exudate Type: [1:red, brown] [N/A:N/A] Exudate Color: [1:Well  defined, not attached] [N/A:N/A] Wound Margin: [1:Large (67-100%)] [N/A:N/A] Granulation A mount: [1:Red] [N/A:N/A] Granulation Quality: [1:Small (1-33%)] [N/A:N/A] Necrotic A mount: [1:Fat Layer (Subcutaneous Tissue): Yes N/A] Exposed Structures: [1:Bone: Yes Fascia: No Tendon: No Muscle: No Joint: No None] [N/A:N/A] Treatment Notes Electronic Signature(s) Signed: 07/08/2020 5:06:48 PM By: Linton Ham MD Signed: 07/08/2020 5:28:36 PM By: Carlene Coria RN Entered By: Linton Ham on 07/08/2020 16:08:45 -------------------------------------------------------------------------------- Multi-Disciplinary Care Plan Details Patient Name: Date of Service: [facility] [name], [name] 07/08/2020 2:15 PM Medical Record Number: [id number] Patient Account Number: [id number] Date of Birth/Sex: Treating RN: [birth year] (86 y.o. Oval Linsey Primary Care Meshilem Machuca: PA Haig Prophet, [place] Other Clinician: Referring Maycee Blasco: Treating Mazzie Brodrick/Extender: Frederick Peers in Treatment: 37 Active Inactive Wound/Skin Impairment Nursing Diagnoses: Impaired tissue integrity Knowledge deficit related to ulceration/compromised skin integrity Goals: Patient/caregiver will verbalize understanding of skin care regimen Date Initiated:  10/17/2019 Target Resolution Date: 07/21/2020 Goal Status: Active Ulcer/skin breakdown will have a volume reduction of 30% by week 4 Date Initiated: 10/17/2019 Date Inactivated: 12/28/2019 Target Resolution Date: 12/28/2019 Goal Status: Unmet Unmet Reason: chronic wound Interventions: Assess patient/caregiver ability to obtain necessary supplies Assess patient/caregiver ability to perform ulcer/skin care regimen upon admission and as needed Assess ulceration(s) every visit Provide education on ulcer and skin care Treatment Activities: Skin care regimen initiated : 10/17/2019 Topical wound management initiated : 10/17/2019 Notes: Electronic Signature(s) Signed: 07/08/2020 5:28:36 PM By: Carlene Coria RN Entered By: Carlene Coria on 07/08/2020 15:40:59 -------------------------------------------------------------------------------- Pain Assessment Details Patient Name: Date of Service: [name], [name] 07/08/2020 2:15 PM Medical Record Number: [id number] Patient Account Number: [id number] Date of Birth/Sex: Treating RN: 03-Feb-1922 (86 y.o. [name] Primary Care Kyler Germer: PA Haig Prophet, [place] Other Clinician: Referring Keagen Heinlen: Treating Eydie Wormley/Extender: Frederick Peers in Treatment: 37 Active Problems Location of Pain Severity and Description of Pain Patient Has Paino No Site Locations Rate the pain. Current Pain Level: 0 Pain Management and Medication Current Pain Management: Electronic Signature(s) Signed: 07/08/2020 5:33:51 PM By: Baruch Gouty RN, BSN Entered By: Baruch Gouty on 07/08/2020 15:09:44 -------------------------------------------------------------------------------- Patient/Caregiver Education Details Patient Name: Date of Service: [facility] 12/7/2021andnbsp2:15 PM Medical Record Number: [id number] Patient Account Number: [id number] Date of Birth/Gender: Treating RN: [birth year] (86 y.o. [name]) Carlene Coria Primary Care Physician: PA  Haig Prophet, [place] Other Clinician: Referring Physician: Treating Physician/Extender: Frederick Peers in Treatment: 50 Education Assessment Education Provided To: Patient Education Topics Provided Wound/Skin Impairment: Methods: Explain/Verbal Responses: State content correctly Electronic Signature(s) Signed: 07/08/2020 5:28:36 PM By: Carlene Coria RN Entered By: Carlene Coria on 07/08/2020 15:41:12 -------------------------------------------------------------------------------- Wound Assessment Details Patient Name: Date of Service: [name], [name] 07/08/2020 2:15 PM Medical Record Number: [id number] Patient Account Number: [id number] Date of Birth/Sex: Treating RN: 1922-06-21 (86 y.o. [name] Primary Care Christpoher Sievers: PA Haig Prophet, [place] Other Clinician: Referring Cuong Moorman: Treating Saralee Bolick/Extender: Frederick Peers in Treatment: 37 Wound Status Wound Number: 1 Primary Pressure Ulcer Etiology: Wound Location: Sacrum Wound Open Wounding Event: Pressure Injury Status: Date Acquired: 09/03/2019 Comorbid Glaucoma, Chronic Obstructive Pulmonary Disease (COPD), Weeks Of Treatment: 37 History: Coronary Artery Disease, Hypertension, Type II Diabetes, Clustered Wound: No Osteoarthritis, Dementia Wound Measurements Length: (cm) 2.5 Width: (cm) 3 Depth: (cm) 2.2 Area: (cm) 5.89 Volume: (cm) 12.959 Wound Description Classification: Category/Stage IV Wound Margin: Well defined, not attached Exudate Amount: Medium Exudate Type: Serosanguineous Exudate Color: red, brown Foul Odor After Cleansing: Slough/Fibrino % Reduction in Area: 32.4% % Reduction in Volume: 12.6%  Epithelialization: None Tunneling: No Undermining: Yes Starting Position (o'clock): 9 Ending Position (o'clock): 5 Maximum Distance: (cm) 3.5 No Yes Wound Bed Granulation Amount: Large (67-100%) Exposed Structure Granulation Quality: Red Fascia Exposed: No Necrotic  Amount: Small (1-33%) Fat Layer (Subcutaneous Tissue) Exposed: Yes Necrotic Quality: Adherent Slough Tendon Exposed: No Muscle Exposed: No Joint Exposed: No Bone Exposed: Yes Treatment Notes Wound #1 (Sacrum) Cleanser Normal Saline Discharge Instruction: Cleanse the wound with Normal Saline prior to applying a clean dressing using gauze sponges, not tissue or cotton balls. Peri-Wound Care Topical Primary Dressing Promogran Prisma Matrix, 4.34 (sq in) (silver collagen) Discharge Instruction: moisten with normal saline Secondary Dressing Woven Gauze Sponge, Non-Sterile 4x4 in Discharge Instruction: moisten with normal saline apply over PRisma ComfortFoam Border, 3x3 in (silicone border) Discharge Instruction: Apply over primary dressing as directed. Secured With Compression Wrap Compression Stockings Environmental education officer) Signed: 07/08/2020 5:28:36 PM By: Carlene Coria RN Signed: 07/08/2020 5:33:51 PM By: Baruch Gouty RN, BSN Entered By: Carlene Coria on 07/08/2020 15:50:15 -------------------------------------------------------------------------------- Vitals Details Patient Name: Date of Service: [facility] [name], [name] 07/08/2020 2:15 PM Medical Record Number: [id number] Patient Account Number: [id number] Date of Birth/Sex: Treating RN: 18-Apr-1922 (86 y.o. [name] Primary Care Brighten Orndoff: PA Haig Prophet, [place] Other Clinician: Referring Rehaan Viloria: Treating Deveron Shamoon/Extender: Frederick Peers in Treatment: 37 Vital Signs Time Taken: 15:05 Temperature (F): 98.4 Height (in): 71 Pulse (bpm): 75 Source: Stated Respiratory Rate (breaths/min): 18 Weight (lbs): 175 Blood Pressure (mmHg): 117/67 Source: Stated Reference Range: 80 - 120 mg / dl Body Mass Index (BMI): 24.4 Electronic Signature(s) Signed: 07/08/2020 5:33:51 PM By: Baruch Gouty RN, BSN Entered By: Baruch Gouty on 07/08/2020 15:09:03

## 2020-07-09 DIAGNOSIS — U071 COVID-19: Secondary | ICD-10-CM | POA: Diagnosis not present

## 2020-07-10 DIAGNOSIS — F0391 Unspecified dementia with behavioral disturbance: Secondary | ICD-10-CM | POA: Diagnosis not present

## 2020-07-10 DIAGNOSIS — N183 Chronic kidney disease, stage 3 unspecified: Secondary | ICD-10-CM | POA: Diagnosis not present

## 2020-07-10 DIAGNOSIS — I5032 Chronic diastolic (congestive) heart failure: Secondary | ICD-10-CM | POA: Diagnosis not present

## 2020-07-10 DIAGNOSIS — E1122 Type 2 diabetes mellitus with diabetic chronic kidney disease: Secondary | ICD-10-CM | POA: Diagnosis not present

## 2020-07-10 DIAGNOSIS — L89154 Pressure ulcer of sacral region, stage 4: Secondary | ICD-10-CM | POA: Diagnosis not present

## 2020-07-10 DIAGNOSIS — I69351 Hemiplegia and hemiparesis following cerebral infarction affecting right dominant side: Secondary | ICD-10-CM | POA: Diagnosis not present

## 2020-07-10 DIAGNOSIS — Z7901 Long term (current) use of anticoagulants: Secondary | ICD-10-CM | POA: Diagnosis not present

## 2020-07-10 DIAGNOSIS — I2699 Other pulmonary embolism without acute cor pulmonale: Secondary | ICD-10-CM | POA: Diagnosis not present

## 2020-07-10 DIAGNOSIS — I130 Hypertensive heart and chronic kidney disease with heart failure and stage 1 through stage 4 chronic kidney disease, or unspecified chronic kidney disease: Secondary | ICD-10-CM | POA: Diagnosis not present

## 2020-07-10 NOTE — Progress Notes (Signed)
[name], [name] ([id number]) Visit Report for 07/08/2020 HPI Details Patient Name: Date of Service: [name], [name] 07/08/2020 2:15 PM Medical Record Number: [id number] Patient Account Number: [id number] Date of Birth/Sex: Treating RN: 1922/02/10 (86 y.o. [name]) Carlene Coria Primary Care Provider: PA Haig Prophet, [place] Other Clinician: Referring Provider: Treating Provider/Extender: Frederick Peers in Treatment: 32 History of Present Illness HPI Description: /A3/17/2021 upon evaluation today patient presents for initial inspection here in our clinic concerning issues that has been having with a wound in the sacral region as well as the left heel. Fortunately neither appears to be overtly and significantly infected there is some necrotic tissue noted at both locations however that is going to be required to be addressed. He does have a history of diabetes mellitus type 2, vascular dementia, and hypertension. The patient did have a stroke in November 2020 unfortunately he has not been the same since that time according to his daughter. Prior to that he was taking care of himself now he is 100% dependent. He is at home with his daughter who is present during the office visit today as well that is where I get the majority of the history from at this point. Patient does have a history of hypertension as well which does not appear to be too out of control today which is good news. He does have a hemoglobin A1c of 6.05 May 2019. They currently have been using a calcium alginate dressing to the sacrum and it sounds like Skin-Prep and a foam on the heel. 4/9; pressure ulcers on the sacral area and left heel. Been using Anasept wet-to-dry on the sacrum and Hydrofera Blue in the heel. He has home health family is helping with the dressing. It sounds as though they are fairly religious about offloading these areas that have pressure relief surfaces for the bed. Wounds look better 4/30; I have not  seen this patient in 3 weeks. We have been using Anasept wet-to-dry the sacrum and Hydrofera Blue on the heel. When I saw this 3 weeks ago the sacrum actually look quite good and I thought that we might be able to transition him to a collagen-based dressing today. 5/10; the since the patient was last here he was hospitalized from 5/5 through 12/07/2019. He was noted to have a pressure injury of the sacral region stage IV. MRI did not suggest discitis or osteomyelitis. There was nonspecific bilateral paraspinal muscle edema. Culture I did when he was here the last time showed E. coli I believe they are aware of this. Blood cultures were negative. His white count was 10.8 on admission 11.9 at discharge his comprehensive metabolic panel notable for an albumin of 2.4 BUN of 32 with creatinine of 0.03 hemoglobin was only 7.3 with a discharge hemoglobin of 8.1 after dropping to a hemoglobin of 6.6 He is back at home. Discharge antibiotic was Augmentin.[name] He was treated with IV Rocephin in the hospital. The patient was seen by palliative care I have not looked at this consult. They have been using dressings that they had left over at home I think most recently [facility]. According to his daughter the patient is eating fairly well 12/28/19-Patient is back at 2 weeks, the sacral wound is about the same, the right heel has healed and closed. We are using silver alginate to the sacral wound 6/18; we continue to follow the sacral wound. His heel wounds have closed. We have been using silver alginate to the sacrum 7/1; sacral wound.  We have been using silver collagen with backing wet-to-dry. No open wounds on the heels. 7/15; some improvement in the dimensions of the sacral wound including the undermining area we have been using silver collagen with backing wet-to-dry dressings. We had some thoughts about trying a wound VAC. He has home health. 03/13/20-Sacral wound looks intact with healthy base, surrounding skin  intact, no maceration, he did unfortunately develop to abrasion wounds on his first second and third toes dorsum of the Right foot this happened when his foot got trapped below the wheelchair Footguard 8/30; sacral wound has come down slightly especially the undermining. We have been using a wound VAC She has 2 necrotic areas on the tip of the right first toe and the right second toe dorsally. 10/8 Is been about 5 weeks since I have seen this man. Family tells me that the sacrum really deteriorated over the last week or 2. The blame home health for some back issues. Dimensions here are a lot larger than last time including the undermining and there is now exposed bone. He had an MRI done in May when he was hospitalized at this area that did not show osteomyelitis nevertheless there is been a fairly significant deterioration since I last saw this wound. He has small abrasions on the right dorsal second and third toes which look as though they are progressing towards closure 10/22; the patient came in 2 weeks ago with exposed bone. Sample showed osteomyelitis culture Enterococcus. Given his frailty and the state of his dementia I elected to try to treat him with oral Augmentin currently on 500 3 times daily. I am going to try to keep him on this for 6 weeks. He is eating and drinking well taking the antibiotics well. So far no overt side effects 11/12; unfortunately the patient was away from clinic longer than I thought he was going to be in they ran out of Augmentin about a week ago. I'm going to renew this but reduce the dose somewhat to twice a day. His wound looks better we've been using silver alginate. No exposed bone today. 12/7; the patient is finished Augmentin had roughly 28 days. For some reason pharmacy will not fill it 2-week supply of liquid Augmentin, in any case I am finished giving him antibiotics. This was for the bone specimen that showed Enterococcus he has been on this for at least 6  weeks. Some improvement in wound area. There is still some exposed bone superiorly in the swollen Electronic Signature(s) Signed: 07/08/2020 5:06:48 PM By: Linton Ham MD Entered By: Linton Ham on 07/08/2020 16:09:50 -------------------------------------------------------------------------------- Physical Exam Details Patient Name: Date of Service: [address] [name], [name] 07/08/2020 2:15 PM Medical Record Number: [id number] Patient Account Number: [id number] Date of Birth/Sex: Treating RN: Feb 05, 1922 (86 y.o. Oval Linsey Primary Care Provider: PA Haig Prophet, [place] Other Clinician: Referring Provider: Treating Provider/Extender: Frederick Peers in Treatment: 37 Constitutional Sitting or standing Blood Pressure is within target range for patient.. Pulse regular and within target range for patient.[name] Respirations regular, non-labored and within target range.. Temperature is normal and within the target range for the patient.[name] Appears in no distress. Respiratory work of breathing is normal. Cardiovascular Appears well-hydrated. Psychiatric Severe dementia. Notes Wound exam; sacral wound. The granulation looks reasonably healthy. I think there is some minor improvements in wound volume. There is still some palpable bone and a small area superiorly. No evidence of surrounding infection. No soft tissue crepitus Electronic Signature(s) Signed: 07/08/2020 5:06:48 PM  By: Linton Ham MD Entered By: Linton Ham on 07/08/2020 16:10:57 -------------------------------------------------------------------------------- Physician Orders Details Patient Name: Date of Service: [place] 07/08/2020 2:15 PM Medical Record Number: [id number] Patient Account Number: [id number] Date of Birth/Sex: Treating RN: 28-May-1922 (86 y.o. [name]) Carlene Coria Primary Care Provider: PA Haig Prophet, [place] Other Clinician: Referring Provider: Treating Provider/Extender: Frederick Peers in Treatment: 37 Verbal / Phone Orders: No Diagnosis Coding ICD-10 Coding Code Description L89.154 Pressure ulcer of sacral region, stage 4 M86.18 Other acute osteomyelitis, other site Follow-up Appointments Return appointment in 3 weeks. Bathing/ Shower/ Hygiene May shower and wash wound with soap and water. - on days dressing changed Edema Control - Lymphedema / SCD / Other Elevate legs to the level of the heart or above for 30 minutes daily and/or when sitting, a frequency of: Avoid standing for long periods of time. Exercise regularly Off-Loading Low air-loss mattress (Group 2) Turn and reposition every 2 hours Other: - place pillows under calf to float heels [place] wound care orders this week; continue Home Health for wound care. May utilize formulary equivalent dressing for wound treatment orders unless otherwise specified. - [facility] Wound Treatment Wound #1 - Sacrum Cleanser: Normal Saline (Generic) Other:3 times per week and when soiled/30 Days Discharge Instructions: Cleanse the wound with Normal Saline prior to applying a clean dressing using gauze sponges, not tissue or cotton balls. Prim Dressing: Promogran Prisma Matrix, 4.34 (sq in) (silver collagen) (Generic) Other:3 times per week and when soiled/30 Days ary Discharge Instructions: moisten with normal saline Secondary Dressing: Woven Gauze Sponge, Non-Sterile 4x4 in (Generic) Other:3 times per week and when soiled/30 Days Discharge Instructions: moisten with normal saline apply over PRisma Secondary Dressing: ComfortFoam Border, 3x3 in (silicone border) (Generic) Other:3 times per week and when soiled/30 Days Discharge Instructions: Apply over primary dressing as directed. Electronic Signature(s) Signed: 07/08/2020 5:06:48 PM By: Linton Ham MD Signed: 07/08/2020 5:28:36 PM By: Carlene Coria RN Entered By: Carlene Coria on 07/08/2020  15:52:19 -------------------------------------------------------------------------------- Problem List Details Patient Name: Date of Service: [facility] ZEBEDIAH, BEEZLEY 07/08/2020 2:15 PM Medical Record Number: [id number] Patient Account Number: [id number] Date of Birth/Sex: Treating RN: 08/24/1921 (86 y.o. [name]) Carlene Coria Primary Care Provider: PA Haig Prophet, [place] Other Clinician: Referring Provider: Treating Provider/Extender: Frederick Peers in Treatment: 37 Active Problems ICD-10 Encounter Code Description Active Date MDM Diagnosis L89.154 Pressure ulcer of sacral region, stage 4 10/17/2019 No Yes M86.18 Other acute osteomyelitis, other site 05/23/2020 No Yes Inactive Problems ICD-10 Code Description Active Date Inactive Date E11.621 Type 2 diabetes mellitus with foot ulcer 10/17/2019 10/17/2019 L97.522 Non-pressure chronic ulcer of other part of left foot with fat layer exposed 10/17/2019 10/17/2019 F01.50 Vascular dementia without behavioral disturbance 10/17/2019 10/17/2019 I10 Essential (primary) hypertension 10/17/2019 10/17/2019 L97.518 Non-pressure chronic ulcer of other part of right foot with other specified severity 03/31/2020 03/31/2020 L03.031 Cellulitis of right toe 03/31/2020 03/31/2020 Resolved Problems Electronic Signature(s) Signed: 07/08/2020 5:06:48 PM By: Linton Ham MD Entered By: Linton Ham on 07/08/2020 16:08:39 -------------------------------------------------------------------------------- Progress Note Details Patient Name: Date of Service: [address] [name], [name] 07/08/2020 2:15 PM Medical Record Number: [id number] Patient Account Number: [id number] Date of Birth/Sex: Treating RN: [birth year] (86 y.o. Oval Linsey Primary Care Provider: PA Haig Prophet, [place] Other Clinician: Referring Provider: Treating Provider/Extender: Frederick Peers in Treatment: 37 Subjective History of Present Illness (HPI) /A3/17/2021 upon evaluation today patient  presents for initial inspection here in our clinic concerning issues that has  been having with a wound in the sacral region as well as the left heel. Fortunately neither appears to be overtly and significantly infected there is some necrotic tissue noted at both locations however that is going to be required to be addressed. He does have a history of diabetes mellitus type 2, vascular dementia, and hypertension. The patient did have a stroke in November 2020 unfortunately he has not been the same since that time according to his daughter. Prior to that he was taking care of himself now he is 100% dependent. He is at home with his daughter who is present during the office visit today as well that is where I get the majority of the history from at this point. Patient does have a history of hypertension as well which does not appear to be too out of control today which is good news. He does have a hemoglobin A1c of 6.05 May 2019. They currently have been using a calcium alginate dressing to the sacrum and it sounds like Skin-Prep and a foam on the heel. 4/9; pressure ulcers on the sacral area and left heel. Been using Anasept wet-to-dry on the sacrum and Hydrofera Blue in the heel. He has home health family is helping with the dressing. It sounds as though they are fairly religious about offloading these areas that have pressure relief surfaces for the bed. Wounds look better 4/30; I have not seen this patient in 3 weeks. We have been using Anasept wet-to-dry the sacrum and Hydrofera Blue on the heel. When I saw this 3 weeks ago the sacrum actually look quite good and I thought that we might be able to transition him to a collagen-based dressing today. 5/10; the since the patient was last here he was hospitalized from 5/5 through 12/07/2019. He was noted to have a pressure injury of the sacral region stage IV. MRI did not suggest discitis or osteomyelitis. There was nonspecific bilateral paraspinal  muscle edema. Culture I did when he was here the last time showed E. coli I believe they are aware of this. Blood cultures were negative. His white count was 10.8 on admission 11.9 at discharge his comprehensive metabolic panel notable for an albumin of 2.4 BUN of 32 with creatinine of 0.03 hemoglobin was only 7.3 with a discharge hemoglobin of 8.1 after dropping to a hemoglobin of 6.6 He is back at home. Discharge antibiotic was Augmentin.[name] He was treated with IV Rocephin in the hospital. The patient was seen by palliative care I have not looked at this consult. They have been using dressings that they had left over at home I think most recently [facility]. According to his daughter the patient is eating fairly well 12/28/19-Patient is back at 2 weeks, the sacral wound is about the same, the right heel has healed and closed. We are using silver alginate to the sacral wound 6/18; we continue to follow the sacral wound. His heel wounds have closed. We have been using silver alginate to the sacrum 7/1; sacral wound. We have been using silver collagen with backing wet-to-dry. No open wounds on the heels. 7/15; some improvement in the dimensions of the sacral wound including the undermining area we have been using silver collagen with backing wet-to-dry dressings. We had some thoughts about trying a wound VAC. He has home health. 03/13/20-Sacral wound looks intact with healthy base, surrounding skin intact, no maceration, he did unfortunately develop to abrasion wounds on his first second and third toes dorsum of the Right foot  this happened when his foot got trapped below the wheelchair Footguard 8/30; sacral wound has come down slightly especially the undermining. We have been using a wound VAC ooShe has 2 necrotic areas on the tip of the right first toe and the right second toe dorsally. 10/8 Is been about 5 weeks since I have seen this man. Family tells me that the sacrum really deteriorated over  the last week or 2. The blame home health for some back issues. Dimensions here are a lot larger than last time including the undermining and there is now exposed bone. He had an MRI done in May when he was hospitalized at this area that did not show osteomyelitis nevertheless there is been a fairly significant deterioration since I last saw this wound. He has small abrasions on the right dorsal second and third toes which look as though they are progressing towards closure 10/22; the patient came in 2 weeks ago with exposed bone. Sample showed osteomyelitis culture Enterococcus. Given his frailty and the state of his dementia I elected to try to treat him with oral Augmentin currently on 500 3 times daily. I am going to try to keep him on this for 6 weeks. He is eating and drinking well taking the antibiotics well. So far no overt side effects 11/12; unfortunately the patient was away from clinic longer than I thought he was going to be in they ran out of Augmentin about a week ago. I'm going to renew this but reduce the dose somewhat to twice a day. His wound looks better we've been using silver alginate. No exposed bone today. 12/7; the patient is finished Augmentin had roughly 28 days. For some reason pharmacy will not fill it 2-week supply of liquid Augmentin, in any case I am finished giving him antibiotics. This was for the bone specimen that showed Enterococcus he has been on this for at least 6 weeks. Some improvement in wound area. There is still some exposed bone superiorly in the swollen Objective Constitutional Sitting or standing Blood Pressure is within target range for patient.. Pulse regular and within target range for patient.[name] Respirations regular, non-labored and within target range.. Temperature is normal and within the target range for the patient.[name] Appears in no distress. Vitals Time Taken: 3:05 PM, Height: 71 in, Source: Stated, Weight: 175 lbs, Source: Stated, BMI: 24.4,  Temperature: 98.4 F, Pulse: 75 bpm, Respiratory Rate: 18 breaths/min, Blood Pressure: 117/67 mmHg. Respiratory work of breathing is normal. Cardiovascular Appears well-hydrated. Psychiatric Severe dementia. General Notes: Wound exam; sacral wound. The granulation looks reasonably healthy. I think there is some minor improvements in wound volume. There is still some palpable bone and a small area superiorly. No evidence of surrounding infection. No soft tissue crepitus Integumentary (Hair, Skin) Wound #1 status is Open. Original cause of wound was Pressure Injury. The wound is located on the Sacrum. The wound measures 2.5cm length x 3cm width x 2.2cm depth; 5.89cm^2 area and 12.959cm^3 volume. There is bone and Fat Layer (Subcutaneous Tissue) exposed. There is no tunneling noted, however, there is undermining starting at 9:00 and ending at 5:00 with a maximum distance of 3.5cm. There is a medium amount of serosanguineous drainage noted. The wound margin is well defined and not attached to the wound base. There is large (67-100%) red granulation within the wound bed. There is a small (1-33%) amount of necrotic tissue within the wound bed including Adherent Slough. Assessment Active Problems ICD-10 Pressure ulcer of sacral region, stage 4  Other acute osteomyelitis, other site Plan Follow-up Appointments: Return appointment in 3 weeks. Bathing/ Shower/ Hygiene: May shower and wash wound with soap and water. - on days dressing changed Edema Control - Lymphedema / SCD / Other: Elevate legs to the level of the heart or above for 30 minutes daily and/or when sitting, a frequency of: Avoid standing for long periods of time. Exercise regularly Off-Loading: Low air-loss mattress (Group 2) Turn and reposition every 2 hours Other: - place pillows under calf to float heels Home Health: New wound care orders this week; continue Home Health for wound care. May utilize formulary equivalent  dressing for wound treatment orders unless otherwise specified. - [facility] WOUND #1: - Sacrum Wound Laterality: Cleanser: Normal Saline (Generic) Other:3 times per week and when soiled/30 Days Discharge Instructions: Cleanse the wound with Normal Saline prior to applying a clean dressing using gauze sponges, not tissue or cotton balls. Prim Dressing: Promogran Prisma Matrix, 4.34 (sq in) (silver collagen) (Generic) Other:3 times per week and when soiled/30 Days ary Discharge Instructions: moisten with normal saline Secondary Dressing: Woven Gauze Sponge, Non-Sterile 4x4 in (Generic) Other:3 times per week and when soiled/30 Days Discharge Instructions: moisten with normal saline apply over PRisma Secondary Dressing: ComfortFoam Border, 3x3 in (silicone border) (Generic) Other:3 times per week and when soiled/30 Days Discharge Instructions: Apply over primary dressing as directed. #1 I changed his dressing to silver collagen with backing wet-to-dry and border foam 2. This can be changed every second dayoo3 times a week. 3. He has marginal oral intake I made some suggestions to his daughter about this 4. I think the odds of healing this are not high although we have certainly made some improve Electronic Signature(s) Signed: 07/08/2020 5:06:48 PM By: Linton Ham MD Entered By: Linton Ham on 07/08/2020 16:12:13 -------------------------------------------------------------------------------- SuperBill Details Patient Name: Date of Service: [facility] [name], [name] 07/08/2020 Medical Record Number: [id number] Patient Account Number: [id number] Date of Birth/Sex: Treating RN: 03-10-1922 (86 y.o. [name]) Carlene Coria Primary Care Provider: PA Haig Prophet, [place] Other Clinician: Referring Provider: Treating Provider/Extender: Frederick Peers in Treatment: 37 Diagnosis Coding ICD-10 Codes Code Description L89.154 Pressure ulcer of sacral region, stage 4 M86.18 Other acute  osteomyelitis, other site Facility Procedures CPT4 Code: [id number] Description: 99213 - WOUND CARE VISIT-LEV 3 EST PT Modifier: Quantity: 1 Physician Procedures : CPT4 Code Description Modifier [id number] - WC PHYS LEVEL 3 - EST PT ICD-10 Diagnosis Description L89.154 Pressure ulcer of sacral region, stage 4 M86.18 Other acute osteomyelitis, other site Quantity: 1 Electronic Signature(s) Signed: 07/08/2020 5:28:36 PM By: Carlene Coria RN Signed: 07/10/2020 3:37:20 PM By: Linton Ham MD Previous Signature: 07/08/2020 5:06:48 PM Version By: Linton Ham MD Entered By: Carlene Coria on 07/08/2020 17:14:56

## 2020-07-11 DIAGNOSIS — L89154 Pressure ulcer of sacral region, stage 4: Secondary | ICD-10-CM | POA: Diagnosis not present

## 2020-07-14 DIAGNOSIS — N183 Chronic kidney disease, stage 3 unspecified: Secondary | ICD-10-CM | POA: Diagnosis not present

## 2020-07-14 DIAGNOSIS — I130 Hypertensive heart and chronic kidney disease with heart failure and stage 1 through stage 4 chronic kidney disease, or unspecified chronic kidney disease: Secondary | ICD-10-CM | POA: Diagnosis not present

## 2020-07-14 DIAGNOSIS — I5032 Chronic diastolic (congestive) heart failure: Secondary | ICD-10-CM | POA: Diagnosis not present

## 2020-07-14 DIAGNOSIS — I2699 Other pulmonary embolism without acute cor pulmonale: Secondary | ICD-10-CM | POA: Diagnosis not present

## 2020-07-14 DIAGNOSIS — F0391 Unspecified dementia with behavioral disturbance: Secondary | ICD-10-CM | POA: Diagnosis not present

## 2020-07-14 DIAGNOSIS — L89154 Pressure ulcer of sacral region, stage 4: Secondary | ICD-10-CM | POA: Diagnosis not present

## 2020-07-14 DIAGNOSIS — E1122 Type 2 diabetes mellitus with diabetic chronic kidney disease: Secondary | ICD-10-CM | POA: Diagnosis not present

## 2020-07-14 DIAGNOSIS — Z7901 Long term (current) use of anticoagulants: Secondary | ICD-10-CM | POA: Diagnosis not present

## 2020-07-14 DIAGNOSIS — I69351 Hemiplegia and hemiparesis following cerebral infarction affecting right dominant side: Secondary | ICD-10-CM | POA: Diagnosis not present

## 2020-07-17 DIAGNOSIS — Z7901 Long term (current) use of anticoagulants: Secondary | ICD-10-CM | POA: Diagnosis not present

## 2020-07-17 DIAGNOSIS — F0391 Unspecified dementia with behavioral disturbance: Secondary | ICD-10-CM | POA: Diagnosis not present

## 2020-07-17 DIAGNOSIS — N183 Chronic kidney disease, stage 3 unspecified: Secondary | ICD-10-CM | POA: Diagnosis not present

## 2020-07-17 DIAGNOSIS — I69351 Hemiplegia and hemiparesis following cerebral infarction affecting right dominant side: Secondary | ICD-10-CM | POA: Diagnosis not present

## 2020-07-17 DIAGNOSIS — L89154 Pressure ulcer of sacral region, stage 4: Secondary | ICD-10-CM | POA: Diagnosis not present

## 2020-07-17 DIAGNOSIS — I130 Hypertensive heart and chronic kidney disease with heart failure and stage 1 through stage 4 chronic kidney disease, or unspecified chronic kidney disease: Secondary | ICD-10-CM | POA: Diagnosis not present

## 2020-07-17 DIAGNOSIS — I5032 Chronic diastolic (congestive) heart failure: Secondary | ICD-10-CM | POA: Diagnosis not present

## 2020-07-17 DIAGNOSIS — E1122 Type 2 diabetes mellitus with diabetic chronic kidney disease: Secondary | ICD-10-CM | POA: Diagnosis not present

## 2020-07-17 DIAGNOSIS — I2699 Other pulmonary embolism without acute cor pulmonale: Secondary | ICD-10-CM | POA: Diagnosis not present

## 2020-07-21 DIAGNOSIS — L89154 Pressure ulcer of sacral region, stage 4: Secondary | ICD-10-CM | POA: Diagnosis not present

## 2020-07-21 DIAGNOSIS — I5032 Chronic diastolic (congestive) heart failure: Secondary | ICD-10-CM | POA: Diagnosis not present

## 2020-07-21 DIAGNOSIS — Z7901 Long term (current) use of anticoagulants: Secondary | ICD-10-CM | POA: Diagnosis not present

## 2020-07-21 DIAGNOSIS — N183 Chronic kidney disease, stage 3 unspecified: Secondary | ICD-10-CM | POA: Diagnosis not present

## 2020-07-21 DIAGNOSIS — E1122 Type 2 diabetes mellitus with diabetic chronic kidney disease: Secondary | ICD-10-CM | POA: Diagnosis not present

## 2020-07-21 DIAGNOSIS — F0391 Unspecified dementia with behavioral disturbance: Secondary | ICD-10-CM | POA: Diagnosis not present

## 2020-07-21 DIAGNOSIS — I130 Hypertensive heart and chronic kidney disease with heart failure and stage 1 through stage 4 chronic kidney disease, or unspecified chronic kidney disease: Secondary | ICD-10-CM | POA: Diagnosis not present

## 2020-07-21 DIAGNOSIS — I2699 Other pulmonary embolism without acute cor pulmonale: Secondary | ICD-10-CM | POA: Diagnosis not present

## 2020-07-21 DIAGNOSIS — I69351 Hemiplegia and hemiparesis following cerebral infarction affecting right dominant side: Secondary | ICD-10-CM | POA: Diagnosis not present

## 2020-07-24 DIAGNOSIS — I2699 Other pulmonary embolism without acute cor pulmonale: Secondary | ICD-10-CM | POA: Diagnosis not present

## 2020-07-24 DIAGNOSIS — F0391 Unspecified dementia with behavioral disturbance: Secondary | ICD-10-CM | POA: Diagnosis not present

## 2020-07-24 DIAGNOSIS — I130 Hypertensive heart and chronic kidney disease with heart failure and stage 1 through stage 4 chronic kidney disease, or unspecified chronic kidney disease: Secondary | ICD-10-CM | POA: Diagnosis not present

## 2020-07-24 DIAGNOSIS — Z7901 Long term (current) use of anticoagulants: Secondary | ICD-10-CM | POA: Diagnosis not present

## 2020-07-24 DIAGNOSIS — I69351 Hemiplegia and hemiparesis following cerebral infarction affecting right dominant side: Secondary | ICD-10-CM | POA: Diagnosis not present

## 2020-07-24 DIAGNOSIS — L89154 Pressure ulcer of sacral region, stage 4: Secondary | ICD-10-CM | POA: Diagnosis not present

## 2020-07-24 DIAGNOSIS — E1122 Type 2 diabetes mellitus with diabetic chronic kidney disease: Secondary | ICD-10-CM | POA: Diagnosis not present

## 2020-07-24 DIAGNOSIS — I5032 Chronic diastolic (congestive) heart failure: Secondary | ICD-10-CM | POA: Diagnosis not present

## 2020-07-24 DIAGNOSIS — N183 Chronic kidney disease, stage 3 unspecified: Secondary | ICD-10-CM | POA: Diagnosis not present

## 2020-07-29 ENCOUNTER — Encounter (HOSPITAL_BASED_OUTPATIENT_CLINIC_OR_DEPARTMENT_OTHER): Payer: Medicare HMO | Admitting: Internal Medicine

## 2020-07-30 DIAGNOSIS — N183 Chronic kidney disease, stage 3 unspecified: Secondary | ICD-10-CM | POA: Diagnosis not present

## 2020-07-30 DIAGNOSIS — I5032 Chronic diastolic (congestive) heart failure: Secondary | ICD-10-CM | POA: Diagnosis not present

## 2020-07-30 DIAGNOSIS — I2699 Other pulmonary embolism without acute cor pulmonale: Secondary | ICD-10-CM | POA: Diagnosis not present

## 2020-07-30 DIAGNOSIS — F0391 Unspecified dementia with behavioral disturbance: Secondary | ICD-10-CM | POA: Diagnosis not present

## 2020-07-30 DIAGNOSIS — I69351 Hemiplegia and hemiparesis following cerebral infarction affecting right dominant side: Secondary | ICD-10-CM | POA: Diagnosis not present

## 2020-07-30 DIAGNOSIS — E1122 Type 2 diabetes mellitus with diabetic chronic kidney disease: Secondary | ICD-10-CM | POA: Diagnosis not present

## 2020-07-30 DIAGNOSIS — L89154 Pressure ulcer of sacral region, stage 4: Secondary | ICD-10-CM | POA: Diagnosis not present

## 2020-07-30 DIAGNOSIS — Z7901 Long term (current) use of anticoagulants: Secondary | ICD-10-CM | POA: Diagnosis not present

## 2020-07-30 DIAGNOSIS — I130 Hypertensive heart and chronic kidney disease with heart failure and stage 1 through stage 4 chronic kidney disease, or unspecified chronic kidney disease: Secondary | ICD-10-CM | POA: Diagnosis not present

## 2020-08-05 DIAGNOSIS — Z7901 Long term (current) use of anticoagulants: Secondary | ICD-10-CM | POA: Diagnosis not present

## 2020-08-05 DIAGNOSIS — I2699 Other pulmonary embolism without acute cor pulmonale: Secondary | ICD-10-CM | POA: Diagnosis not present

## 2020-08-05 DIAGNOSIS — N183 Chronic kidney disease, stage 3 unspecified: Secondary | ICD-10-CM | POA: Diagnosis not present

## 2020-08-05 DIAGNOSIS — I5032 Chronic diastolic (congestive) heart failure: Secondary | ICD-10-CM | POA: Diagnosis not present

## 2020-08-05 DIAGNOSIS — L89154 Pressure ulcer of sacral region, stage 4: Secondary | ICD-10-CM | POA: Diagnosis not present

## 2020-08-05 DIAGNOSIS — E1122 Type 2 diabetes mellitus with diabetic chronic kidney disease: Secondary | ICD-10-CM | POA: Diagnosis not present

## 2020-08-05 DIAGNOSIS — I69351 Hemiplegia and hemiparesis following cerebral infarction affecting right dominant side: Secondary | ICD-10-CM | POA: Diagnosis not present

## 2020-08-05 DIAGNOSIS — I130 Hypertensive heart and chronic kidney disease with heart failure and stage 1 through stage 4 chronic kidney disease, or unspecified chronic kidney disease: Secondary | ICD-10-CM | POA: Diagnosis not present

## 2020-08-05 DIAGNOSIS — F0391 Unspecified dementia with behavioral disturbance: Secondary | ICD-10-CM | POA: Diagnosis not present

## 2020-08-08 DIAGNOSIS — F0391 Unspecified dementia with behavioral disturbance: Secondary | ICD-10-CM | POA: Diagnosis not present

## 2020-08-08 DIAGNOSIS — I69351 Hemiplegia and hemiparesis following cerebral infarction affecting right dominant side: Secondary | ICD-10-CM | POA: Diagnosis not present

## 2020-08-08 DIAGNOSIS — I5032 Chronic diastolic (congestive) heart failure: Secondary | ICD-10-CM | POA: Diagnosis not present

## 2020-08-08 DIAGNOSIS — L89154 Pressure ulcer of sacral region, stage 4: Secondary | ICD-10-CM | POA: Diagnosis not present

## 2020-08-08 DIAGNOSIS — Z7901 Long term (current) use of anticoagulants: Secondary | ICD-10-CM | POA: Diagnosis not present

## 2020-08-08 DIAGNOSIS — N183 Chronic kidney disease, stage 3 unspecified: Secondary | ICD-10-CM | POA: Diagnosis not present

## 2020-08-08 DIAGNOSIS — E1122 Type 2 diabetes mellitus with diabetic chronic kidney disease: Secondary | ICD-10-CM | POA: Diagnosis not present

## 2020-08-08 DIAGNOSIS — I130 Hypertensive heart and chronic kidney disease with heart failure and stage 1 through stage 4 chronic kidney disease, or unspecified chronic kidney disease: Secondary | ICD-10-CM | POA: Diagnosis not present

## 2020-08-08 DIAGNOSIS — I2699 Other pulmonary embolism without acute cor pulmonale: Secondary | ICD-10-CM | POA: Diagnosis not present

## 2020-08-11 DIAGNOSIS — N183 Chronic kidney disease, stage 3 unspecified: Secondary | ICD-10-CM | POA: Diagnosis not present

## 2020-08-11 DIAGNOSIS — Z7901 Long term (current) use of anticoagulants: Secondary | ICD-10-CM | POA: Diagnosis not present

## 2020-08-11 DIAGNOSIS — E1122 Type 2 diabetes mellitus with diabetic chronic kidney disease: Secondary | ICD-10-CM | POA: Diagnosis not present

## 2020-08-11 DIAGNOSIS — I2699 Other pulmonary embolism without acute cor pulmonale: Secondary | ICD-10-CM | POA: Diagnosis not present

## 2020-08-11 DIAGNOSIS — F0391 Unspecified dementia with behavioral disturbance: Secondary | ICD-10-CM | POA: Diagnosis not present

## 2020-08-11 DIAGNOSIS — I130 Hypertensive heart and chronic kidney disease with heart failure and stage 1 through stage 4 chronic kidney disease, or unspecified chronic kidney disease: Secondary | ICD-10-CM | POA: Diagnosis not present

## 2020-08-11 DIAGNOSIS — I5032 Chronic diastolic (congestive) heart failure: Secondary | ICD-10-CM | POA: Diagnosis not present

## 2020-08-11 DIAGNOSIS — I69351 Hemiplegia and hemiparesis following cerebral infarction affecting right dominant side: Secondary | ICD-10-CM | POA: Diagnosis not present

## 2020-08-11 DIAGNOSIS — L89154 Pressure ulcer of sacral region, stage 4: Secondary | ICD-10-CM | POA: Diagnosis not present

## 2020-08-12 ENCOUNTER — Encounter (HOSPITAL_BASED_OUTPATIENT_CLINIC_OR_DEPARTMENT_OTHER): Payer: Medicare HMO | Attending: Internal Medicine | Admitting: Internal Medicine

## 2020-08-12 ENCOUNTER — Other Ambulatory Visit: Payer: Self-pay

## 2020-08-12 DIAGNOSIS — I10 Essential (primary) hypertension: Secondary | ICD-10-CM | POA: Insufficient documentation

## 2020-08-12 DIAGNOSIS — L89154 Pressure ulcer of sacral region, stage 4: Secondary | ICD-10-CM | POA: Insufficient documentation

## 2020-08-12 DIAGNOSIS — E1169 Type 2 diabetes mellitus with other specified complication: Secondary | ICD-10-CM | POA: Insufficient documentation

## 2020-08-12 DIAGNOSIS — E11622 Type 2 diabetes mellitus with other skin ulcer: Secondary | ICD-10-CM | POA: Insufficient documentation

## 2020-08-12 DIAGNOSIS — R6889 Other general symptoms and signs: Secondary | ICD-10-CM | POA: Diagnosis not present

## 2020-08-12 DIAGNOSIS — M868X8 Other osteomyelitis, other site: Secondary | ICD-10-CM | POA: Insufficient documentation

## 2020-08-12 DIAGNOSIS — Z8673 Personal history of transient ischemic attack (TIA), and cerebral infarction without residual deficits: Secondary | ICD-10-CM | POA: Insufficient documentation

## 2020-08-12 DIAGNOSIS — F0150 Vascular dementia without behavioral disturbance: Secondary | ICD-10-CM | POA: Diagnosis not present

## 2020-08-12 NOTE — Progress Notes (Signed)
[name], [name] ([id number]) Visit Report for 08/12/2020 HPI Details Patient Name: Date of Service: [name], [name] 08/12/2020 3:15 PM Medical Record Number: [id number] Patient Account Number: [id number] Date of Birth/Sex: Treating RN: 1921-09-20 (87 y.o. [name], [name] Primary Care Provider: PA Haig [name], NO Other Clinician: Referring Provider: Treating Provider/Extender: Frederick Peers in Treatment: 42 History of Present Illness HPI Description: /A3/17/2021 upon evaluation today patient presents for initial inspection here in our clinic concerning issues that has been having with a wound in the sacral region as well as the left heel. Fortunately neither appears to be overtly and significantly infected there is some necrotic tissue noted at both locations however that is going to be required to be addressed. He does have a history of diabetes mellitus type 2, vascular dementia, and hypertension. The patient did have a stroke in November 2020 unfortunately he has not been the same since that time according to his daughter. Prior to that he was taking care of himself now he is 100% dependent. He is at home with his daughter who is present during the office visit today as well that is where I get the majority of the history from at this point. Patient does have a history of hypertension as well which does not appear to be too out of control today which is good news. He does have a hemoglobin A1c of 6.05 May 2019. They currently have been using a calcium alginate dressing to the sacrum and it sounds like Skin-Prep and a foam on the heel. 4/9; pressure ulcers on the sacral area and left heel. Been using Anasept wet-to-dry on the sacrum and Hydrofera Blue in the heel. He has home health family is helping with the dressing. It sounds as though they are fairly religious about offloading these areas that have pressure relief surfaces for the bed. Wounds look better 4/30; I have  not seen this patient in 3 weeks. We have been using Anasept wet-to-dry the sacrum and Hydrofera Blue on the heel. When I saw this 3 weeks ago the sacrum actually look quite good and I thought that we might be able to transition him to a collagen-based dressing today. 5/10; the since the patient was last here he was hospitalized from 5/5 through 12/07/2019. He was noted to have a pressure injury of the sacral region stage IV. MRI did not suggest discitis or osteomyelitis. There was nonspecific bilateral paraspinal muscle edema. Culture I did when he was here the last time showed E. coli I believe they are aware of this. Blood cultures were negative. His white count was 10.8 on admission 11.9 at discharge his comprehensive metabolic panel notable for an albumin of 2.4 BUN of 32 with creatinine of 0.03 hemoglobin was only 7.3 with a discharge hemoglobin of 8.1 after dropping to a hemoglobin of 6.6 He is back at home. Discharge antibiotic was Augmentin.[name] He was treated with IV Rocephin in the hospital. The patient was seen by palliative care I have not looked at this consult. They have been using dressings that they had left over at home I think most recently [facility]. According to his daughter the patient is eating fairly well 12/28/19-Patient is back at 2 weeks, the sacral wound is about the same, the right heel has healed and closed. We are using silver alginate to the sacral wound 6/18; we continue to follow the sacral wound. His heel wounds have closed. We have been using silver alginate to the sacrum 7/1; sacral wound.  We have been using silver collagen with backing wet-to-dry. No open wounds on the heels. 7/15; some improvement in the dimensions of the sacral wound including the undermining area we have been using silver collagen with backing wet-to-dry dressings. We had some thoughts about trying a wound VAC. He has home health. 03/13/20-Sacral wound looks intact with healthy base, surrounding  skin intact, no maceration, he did unfortunately develop to abrasion wounds on his first second and third toes dorsum of the Right foot this happened when his foot got trapped below the wheelchair Footguard 8/30; sacral wound has come down slightly especially the undermining. We have been using a wound VAC She has 2 necrotic areas on the tip of the right first toe and the right second toe dorsally. 10/8 Is been about 5 weeks since I have seen this man. Family tells me that the sacrum really deteriorated over the last week or 2. The blame home health for some back issues. Dimensions here are a lot larger than last time including the undermining and there is now exposed bone. He had an MRI done in May when he was hospitalized at this area that did not show osteomyelitis nevertheless there is been a fairly significant deterioration since I last saw this wound. He has small abrasions on the right dorsal second and third toes which look as though they are progressing towards closure 10/22; the patient came in 2 weeks ago with exposed bone. Sample showed osteomyelitis culture Enterococcus. Given his frailty and the state of his dementia I elected to try to treat him with oral Augmentin currently on 500 3 times daily. I am going to try to keep him on this for 6 weeks. He is eating and drinking well taking the antibiotics well. So far no overt side effects 11/12; unfortunately the patient was away from clinic longer than I thought he was going to be in they ran out of Augmentin about a week ago. I'm going to renew this but reduce the dose somewhat to twice a day. His wound looks better we've been using silver alginate. No exposed bone today. 12/7; the patient is finished Augmentin had roughly 28 days. For some reason pharmacy will not fill it 2-week supply of liquid Augmentin, in any case I am finished giving him antibiotics. This was for the bone specimen that showed Enterococcus he has been on this for at  least 6 weeks. Some improvement in wound area. There is still some exposed bone superiorly in the swollen 08/12/2020; 1 month follow-up. We treated him for underlying osteomyelitis. He is using silver collagen backing wet-to-dry. Advanced dementia precludes use of a wound VAC. I did talk with his daughter today about advanced treatment products but she is having to change the dressing too frequently during the week to make this possible. He is not a candidate for plastic surgery therefore I really think that the collagen backing wet-to- dry is the best option here. He has 1.1 cm in direct depth and 1.9 cm and undermining from 11-6 o'clock. Electronic Signature(s) Signed: 08/12/2020 5:07:05 PM By: Linton Ham MD Entered By: Linton Ham on 08/12/2020 16:56:51 -------------------------------------------------------------------------------- Physical Exam Details Patient Name: Date of Service: KAIMEN, BRUSS 08/12/2020 3:15 PM Medical Record Number: [id number] Patient Account Number: [id number] Date of Birth/Sex: Treating RN: [birth year] (87 y.o. Erie Noe Primary Care Provider: PA Haig [name], NO Other Clinician: Referring Provider: Treating Provider/Extender: Frederick Peers in Treatment: 42 Constitutional Sitting or standing Blood Pressure is within target  range for patient.. Pulse regular and within target range for patient.[name] Respirations regular, non-labored and within target range.. Temperature is normal and within the target range for the patient.[name] Appears in no distress. Psychiatric Stable severe dementia. Notes Wound exam; sacral wound. The tissue here looks healthy. The undermining may be about the same. Some reduction in the direct depth there is no palpable bone and no overt surrounding infection. No erythema no soft tissue crepitus Electronic Signature(s) Signed: 08/12/2020 5:07:05 PM By: Linton Ham MD Entered By: Linton Ham on 08/12/2020  16:58:27 -------------------------------------------------------------------------------- Physician Orders Details Patient Name: Date of Service: [facility] JAVAUN, DEETER 08/12/2020 3:15 PM Medical Record Number: [id number] Patient Account Number: [id number] Date of Birth/Sex: Treating RN: November 06, 1921 (87 y.o. Erie Noe Primary Care Provider: PA Haig [name], NO Other Clinician: Referring Provider: Treating Provider/Extender: Frederick Peers in Treatment: 42 Verbal / Phone Orders: No Diagnosis Coding Follow-up Appointments Return appointment in 1 month. Bathing/ Shower/ Hygiene May shower and wash wound with soap and water. - on days dressing changed Edema Control - Lymphedema / SCD / Other Elevate legs to the level of the heart or above for 30 minutes daily and/or when sitting, a frequency of: Avoid standing for long periods of time. Exercise regularly Off-Loading Low air-loss mattress (Group 2) Turn and reposition every 2 hours Other: - place pillows under calf to float heels Home Health No change in wound care orders this week; continue Home Health for wound care. May utilize formulary equivalent dressing for wound treatment orders unless otherwise specified. Wound Treatment Wound #1 - Sacrum Cleanser: Normal Saline (Generic) Other:3 times per week and when soiled/30 Days Discharge Instructions: Cleanse the wound with Normal Saline prior to applying a clean dressing using gauze sponges, not tissue or cotton balls. Prim Dressing: Promogran Prisma Matrix, 4.34 (sq in) (silver collagen) (Generic) Other:3 times per week and when soiled/30 Days ary Discharge Instructions: moisten with normal saline Secondary Dressing: Woven Gauze Sponge, Non-Sterile 4x4 in (Generic) Other:3 times per week and when soiled/30 Days Discharge Instructions: moisten with normal saline apply over PRisma Secondary Dressing: ComfortFoam Border, 3x3 in (silicone border) (Generic) Other:3 times  per week and when soiled/30 Days Discharge Instructions: Apply over primary dressing as directed. Electronic Signature(s) Signed: 08/12/2020 5:07:05 PM By: Linton Ham MD Signed: 08/12/2020 5:35:07 PM By: Rhae Hammock RN Entered By: Rhae Hammock on 08/12/2020 16:20:52 -------------------------------------------------------------------------------- Problem List Details Patient Name: Date of Service: [facility] EFRAN, BLAKER 08/12/2020 3:15 PM Medical Record Number: [id number] Patient Account Number: [id number] Date of Birth/Sex: Treating RN: [birth year] (87 y.o. [name], [name] Primary Care Provider: PA Haig [name], NO Other Clinician: Referring Provider: Treating Provider/Extender: Frederick Peers in Treatment: 42 Active Problems ICD-10 Encounter Code Description Active Date MDM Diagnosis L89.154 Pressure ulcer of sacral region, stage 4 10/17/2019 No Yes M86.18 Other acute osteomyelitis, other site 05/23/2020 No Yes Inactive Problems ICD-10 Code Description Active Date Inactive Date E11.621 Type 2 diabetes mellitus with foot ulcer 10/17/2019 10/17/2019 L97.522 Non-pressure chronic ulcer of other part of left foot with fat layer exposed 10/17/2019 10/17/2019 F01.50 Vascular dementia without behavioral disturbance 10/17/2019 10/17/2019 I10 Essential (primary) hypertension 10/17/2019 10/17/2019 L97.518 Non-pressure chronic ulcer of other part of right foot with other specified severity 03/31/2020 03/31/2020 L03.031 Cellulitis of right toe 03/31/2020 03/31/2020 Resolved Problems Electronic Signature(s) Signed: 08/12/2020 5:07:05 PM By: Linton Ham MD Entered By: Linton Ham on 08/12/2020 16:54:01 -------------------------------------------------------------------------------- Progress Note Details Patient Name: Date of Service: [facility], Jeanine Luz 08/12/2020 3:15  PM Medical Record Number: [id number] Patient Account Number: [id number] Date of Birth/Sex: Treating  RN: 06/17/1922 (87 y.o. Lucious GrovesM) Breedlove, [name] Primary Care Provider: PA Zenovia JordanIENT, NO Other Clinician: Referring Provider: Treating Provider/Extender: Virgia Landobson, Evamaria Detore Reed, Tiffany Weeks in Treatment: 42 Subjective History of Present Illness (HPI) /A3/17/2021 upon evaluation today patient presents for initial inspection here in our clinic concerning issues that has been having with a wound in the sacral region as well as the left heel. Fortunately neither appears to be overtly and significantly infected there is some necrotic tissue noted at both locations however that is going to be required to be addressed. He does have a history of diabetes mellitus type 2, vascular dementia, and hypertension. The patient did have a stroke in November 2020 unfortunately he has not been the same since that time according to his daughter. Prior to that he was taking care of himself now he is 100% dependent. He is at home with his daughter who is present during the office visit today as well that is where I get the majority of the history from at this point. Patient does have a history of hypertension as well which does not appear to be too out of control today which is good news. He does have a hemoglobin A1c of 6.05 May 2019. They currently have been using a calcium alginate dressing to the sacrum and it sounds like Skin-Prep and a foam on the heel. 4/9; pressure ulcers on the sacral area and left heel. Been using Anasept wet-to-dry on the sacrum and Hydrofera Blue in the heel. He has home health family is helping with the dressing. It sounds as though they are fairly religious about offloading these areas that have pressure relief surfaces for the bed. Wounds look better 4/30; I have not seen this patient in 3 weeks. We have been using Anasept wet-to-dry the sacrum and Hydrofera Blue on the heel. When I saw this 3 weeks ago the sacrum actually look quite good and I thought that we might be able to transition him  to a collagen-based dressing today. 5/10; the since the patient was last here he was hospitalized from 5/5 through 12/07/2019. He was noted to have a pressure injury of the sacral region stage IV. MRI did not suggest discitis or osteomyelitis. There was nonspecific bilateral paraspinal muscle edema. Culture I did when he was here the last time showed E. coli I believe they are aware of this. Blood cultures were negative. His white count was 10.8 on admission 11.9 at discharge his comprehensive metabolic panel notable for an albumin of 2.4 BUN of 32 with creatinine of 0.03 hemoglobin was only 7.3 with a discharge hemoglobin of 8.1 after dropping to a hemoglobin of 6.6 He is back at home. Discharge antibiotic was Augmentin.[name]. He was treated with IV Rocephin in the hospital. The patient was seen by palliative care I have not looked at this consult. They have been using dressings that they had left over at home I [facility]. According to his daughter the patient is eating fairly well 12/28/19-Patient is back at 2 weeks, the sacral wound is about the same, the right heel has healed and closed. We are using silver alginate to the sacral wound 6/18; we continue to follow the sacral wound. His heel wounds have closed. We have been using silver alginate to the sacrum 7/1; sacral wound. We have been using silver collagen with backing wet-to-dry. No open wounds on the heels. 7/15; some improvement in  the dimensions of the sacral wound including the undermining area we have been using silver collagen with backing wet-to-dry dressings. We had some thoughts about trying a wound VAC. He has home health. 03/13/20-Sacral wound looks intact with healthy base, surrounding skin intact, no maceration, he did unfortunately develop to abrasion wounds on his first second and third toes dorsum of the Right foot this happened when his foot got trapped below the wheelchair Footguard 8/30; sacral wound has  come down slightly especially the undermining. We have been using a wound VAC ooShe has 2 necrotic areas on the tip of the right first toe and the right second toe dorsally. 10/8 Is been about 5 weeks since I have seen this man. Family tells me that the sacrum really deteriorated over the last week or 2. The blame home health for some back issues. Dimensions here are a lot larger than last time including the undermining and there is now exposed bone. He had an MRI done in May when he was hospitalized at this area that did not show osteomyelitis nevertheless there is been a fairly significant deterioration since I last saw this wound. He has small abrasions on the right dorsal second and third toes which look as though they are progressing towards closure 10/22; the patient came in 2 weeks ago with exposed bone. Sample showed osteomyelitis culture Enterococcus. Given his frailty and the state of his dementia I elected to try to treat him with oral Augmentin currently on 500 3 times daily. I am going to try to keep him on this for 6 weeks. He is eating and drinking well taking the antibiotics well. So far no overt side effects 11/12; unfortunately the patient was away from clinic longer than I thought he was going to be in they ran out of Augmentin about a week ago. I'm going to renew this but reduce the dose somewhat to twice a day. His wound looks better we've been using silver alginate. No exposed bone today. 12/7; the patient is finished Augmentin had roughly 28 days. For some reason pharmacy will not fill it 2-week supply of liquid Augmentin, in any case I am finished giving him antibiotics. This was for the bone specimen that showed Enterococcus he has been on this for at least 6 weeks. Some improvement in wound area. There is still some exposed bone superiorly in the swollen 08/12/2020; 1 month follow-up. We treated him for underlying osteomyelitis. He is using silver collagen backing  wet-to-dry. Advanced dementia precludes use of a wound VAC. I did talk with his daughter today about advanced treatment products but she is having to change the dressing too frequently during the week to make this possible. He is not a candidate for plastic surgery therefore I really think that the collagen backing wet-to- dry is the best option here. He has 1.1 cm in direct depth and 1.9 cm and undermining from 11-6 o'clock. Objective Constitutional Sitting or standing Blood Pressure is within target range for patient.. Pulse regular and within target range for patient.[name] Respirations regular, non-labored and within target range.. Temperature is normal and within the target range for the patient.[name] Appears in no distress. Vitals Time Taken: 3:32 PM, Height: 71 in, Weight: 175 lbs, BMI: 24.4, Temperature: 97.8 F, Pulse: 122 bpm, Respiratory Rate: 18 breaths/min, Blood Pressure: 117/67 mmHg. Psychiatric Stable severe dementia. General Notes: Wound exam; sacral wound. The tissue here looks healthy. The undermining may be about the same. Some reduction in the direct depth there is  no palpable bone and no overt surrounding infection. No erythema no soft tissue crepitus Integumentary (Hair, Skin) Wound #1 status is Open. Original cause of wound was Pressure Injury. The wound is located on the Sacrum. The wound measures 3cm length x 2.4cm width x 1.1cm depth; 5.655cm^2 area and 6.22cm^3 volume. There is bone and Fat Layer (Subcutaneous Tissue) exposed. There is no tunneling noted, however, there is undermining starting at 11:00 and ending at 6:00 with a maximum distance of 1.9cm. There is a medium amount of serosanguineous drainage noted. The wound margin is well defined and not attached to the wound base. There is large (67-100%) red granulation within the wound bed. There is a small (1-33%) amount of necrotic tissue within the wound bed including Adherent Slough. Assessment Active  Problems ICD-10 Pressure ulcer of sacral region, stage 4 Other acute osteomyelitis, other site Plan Follow-up Appointments: Return appointment in 1 month. Bathing/ Shower/ Hygiene: May shower and wash wound with soap and water. - on days dressing changed Edema Control - Lymphedema / SCD / Other: Elevate legs to the level of the heart or above for 30 minutes daily and/or when sitting, a frequency of: Avoid standing for long periods of time. Exercise regularly Off-Loading: Low air-loss mattress (Group 2) Turn and reposition every 2 hours Other: - place pillows under calf to float heels Home Health: No change in wound care orders this week; continue Home Health for wound care. May utilize formulary equivalent dressing for wound treatment orders unless otherwise specified. WOUND #1: - Sacrum Wound Laterality: Cleanser: Normal Saline (Generic) Other:3 times per week and when soiled/30 Days Discharge Instructions: Cleanse the wound with Normal Saline prior to applying a clean dressing using gauze sponges, not tissue or cotton balls. Prim Dressing: Promogran Prisma Matrix, 4.34 (sq in) (silver collagen) (Generic) Other:3 times per week and when soiled/30 Days ary Discharge Instructions: moisten with normal saline Secondary Dressing: Woven Gauze Sponge, Non-Sterile 4x4 in (Generic) Other:3 times per week and when soiled/30 Days Discharge Instructions: moisten with normal saline apply over PRisma Secondary Dressing: ComfortFoam Border, 3x3 in (silicone border) (Generic) Other:3 times per week and when soiled/30 Days Discharge Instructions: Apply over primary dressing as directed. 1. At this point going over the options I did not see anything that was feasible to change. Therefore we will continue with collagen packing with the dry. We have certainly made some progress 2. I treated him empirically with Augmentin for underlying Enterococcus osteomyelitis things appear to be stable although I am  not completely clear or certain of the effect although there is no exposed bone now. 3. In going over the options here I do not think he is a candidate for a wound VAC or plastic surgery. An advanced treatment option would certainly be a nice thought however the wound is either contaminated or dislodged during the week and that simply could not happen. She would not have anything to change the dressing to Electronic Signature(s) Signed: 08/12/2020 5:07:05 PM By: Linton Ham MD Entered By: Linton Ham on 08/12/2020 17:00:11 -------------------------------------------------------------------------------- SuperBill Details Patient Name: Date of Service: [facility] [place], [place] 08/12/2020 Medical Record Number: [id number] Patient Account Number: [id number] Date of Birth/Sex: Treating RN: 29-Apr-1922 (87 y.o. Erie Noe Primary Care Provider: PA Haig [name], NO Other Clinician: Referring Provider: Treating Provider/Extender: Frederick Peers in Treatment: 42 Diagnosis Coding ICD-10 Codes Code Description L89.154 Pressure ulcer of sacral region, stage 4 M86.18 Other acute osteomyelitis, other site Facility Procedures CPT4 Code: [id number] Description: [phone] -  WOUND CARE VISIT-LEV 3 EST PT Modifier: Quantity: 1 Physician Procedures : CPT4 Code Description Modifier [id number] - WC PHYS LEVEL 3 - EST PT ICD-10 Diagnosis Description L89.154 Pressure ulcer of sacral region, stage 4 M86.18 Other acute osteomyelitis, other site Quantity: 1 Electronic Signature(s) Signed: 08/12/2020 5:07:05 PM By: Linton Ham MD Entered By: Linton Ham on 08/12/2020 17:00:40

## 2020-08-14 DIAGNOSIS — I2699 Other pulmonary embolism without acute cor pulmonale: Secondary | ICD-10-CM | POA: Diagnosis not present

## 2020-08-14 DIAGNOSIS — Z7901 Long term (current) use of anticoagulants: Secondary | ICD-10-CM | POA: Diagnosis not present

## 2020-08-14 DIAGNOSIS — E1122 Type 2 diabetes mellitus with diabetic chronic kidney disease: Secondary | ICD-10-CM | POA: Diagnosis not present

## 2020-08-14 DIAGNOSIS — L89154 Pressure ulcer of sacral region, stage 4: Secondary | ICD-10-CM | POA: Diagnosis not present

## 2020-08-14 DIAGNOSIS — F0391 Unspecified dementia with behavioral disturbance: Secondary | ICD-10-CM | POA: Diagnosis not present

## 2020-08-14 DIAGNOSIS — I69351 Hemiplegia and hemiparesis following cerebral infarction affecting right dominant side: Secondary | ICD-10-CM | POA: Diagnosis not present

## 2020-08-14 DIAGNOSIS — I130 Hypertensive heart and chronic kidney disease with heart failure and stage 1 through stage 4 chronic kidney disease, or unspecified chronic kidney disease: Secondary | ICD-10-CM | POA: Diagnosis not present

## 2020-08-14 DIAGNOSIS — N183 Chronic kidney disease, stage 3 unspecified: Secondary | ICD-10-CM | POA: Diagnosis not present

## 2020-08-14 DIAGNOSIS — I5032 Chronic diastolic (congestive) heart failure: Secondary | ICD-10-CM | POA: Diagnosis not present

## 2020-08-14 NOTE — Progress Notes (Signed)
[name], [name] ([id number]) Visit Report for 08/12/2020 Arrival Information Details Patient Name: Date of Service: [name], [name] 08/12/2020 3:15 PM Medical Record Number: [id number] Patient Account Number: [id number] Date of Birth/Sex: Treating RN: February 13, 1922 (87 y.o. [name] Primary Care Saraphina Lauderbaugh: PA Haig Prophet, NO Other Clinician: Referring Mieka Leaton: Treating Krystina Strieter/Extender: Frederick Peers in Treatment: 42 Visit Information History Since Last Visit Added or deleted any medications: No Patient Arrived: Wheel Chair Any new allergies or adverse reactions: No Arrival Time: 15:31 Had a fall or experienced change in No Accompanied By: daughter activities of daily living that may affect Transfer Assistance: Harrel Lemon Lift risk of falls: Patient Identification Verified: Yes Signs or symptoms of abuse/neglect since last visito No Secondary Verification Process Completed: Yes Hospitalized since last visit: No Patient Requires Transmission-Based Precautions: No Implantable device outside of the clinic excluding No Patient Has Alerts: No cellular tissue based products placed in the center since last visit: Has Dressing in Place as Prescribed: Yes Pain Present Now: No Electronic Signature(s) Signed: 08/14/2020 5:28:50 PM By: Levan Hurst RN, BSN Entered By: Levan Hurst on 08/12/2020 17:57:43 -------------------------------------------------------------------------------- Clinic Level of Care Assessment Details Patient Name: Date of Service: [facility] KALMAN, [place] 08/12/2020 3:15 PM Medical Record Number: [id number] Patient Account Number: [id number] Date of Birth/Sex: Treating RN: 05-25-1922 (87 y.o. Burnadette Pop, Lauren Primary Care Jaclynn Laumann: PA Haig Prophet, NO Other Clinician: Referring Sherae Santino: Treating Frederick Marro/Extender: Frederick Peers in Treatment: 42 Clinic Level of Care Assessment Items TOOL 4 Quantity Score X- 1 0 Use when only an EandM  is performed on FOLLOW-UP visit ASSESSMENTS - Nursing Assessment / Reassessment X- 1 10 Reassessment of Co-morbidities (includes updates in patient status) X- 1 5 Reassessment of Adherence to Treatment Plan ASSESSMENTS - Wound and Skin A ssessment / Reassessment X - Simple Wound Assessment / Reassessment - one wound 1 5 []  - 0 Complex Wound Assessment / Reassessment - multiple wounds X- 1 10 Dermatologic / Skin Assessment (not related to wound area) ASSESSMENTS - Focused Assessment []  - 0 Circumferential Edema Measurements - multi extremities X- 1 10 Nutritional Assessment / Counseling / Intervention []  - 0 Lower Extremity Assessment (monofilament, tuning fork, pulses) []  - 0 Peripheral Arterial Disease Assessment (using hand held doppler) ASSESSMENTS - Ostomy and/or Continence Assessment and Care []  - 0 Incontinence Assessment and Management []  - 0 Ostomy Care Assessment and Management (repouching, etc.) PROCESS - Coordination of Care X - Simple Patient / Family Education for ongoing care 1 15 []  - 0 Complex (extensive) Patient / Family Education for ongoing care X- 1 10 Staff obtains Programmer, systems, Records, T Results / Process Orders est []  - 0 Staff telephones HHA, Nursing Homes / Clarify orders / etc []  - 0 Routine Transfer to another Facility (non-emergent condition) []  - 0 Routine Hospital Admission (non-emergent condition) []  - 0 New Admissions / Biomedical engineer / Ordering NPWT Apligraf, etc. , []  - 0 Emergency Hospital Admission (emergent condition) X- 1 10 Simple Discharge Coordination []  - 0 Complex (extensive) Discharge Coordination PROCESS - Special Needs []  - 0 Pediatric / Minor Patient Management []  - 0 Isolation Patient Management []  - 0 Hearing / Language / Visual special needs []  - 0 Assessment of Community assistance (transportation, D/C planning, etc.) []  - 0 Additional assistance / Altered mentation []  - 0 Support Surface(s)  Assessment (bed, cushion, seat, etc.) INTERVENTIONS - Wound Cleansing / Measurement X - Simple Wound Cleansing - one wound 1 5 []  - 0 Complex Wound Cleansing -  multiple wounds X- 1 5 Wound Imaging (photographs - any number of wounds) []  - 0 Wound Tracing (instead of photographs) X- 1 5 Simple Wound Measurement - one wound []  - 0 Complex Wound Measurement - multiple wounds INTERVENTIONS - Wound Dressings X - Small Wound Dressing one or multiple wounds 1 10 []  - 0 Medium Wound Dressing one or multiple wounds []  - 0 Large Wound Dressing one or multiple wounds []  - 0 Application of Medications - topical []  - 0 Application of Medications - injection INTERVENTIONS - Miscellaneous []  - 0 External ear exam []  - 0 Specimen Collection (cultures, biopsies, blood, body fluids, etc.) []  - 0 Specimen(s) / Culture(s) sent or taken to Lab for analysis X- 1 10 Patient Transfer (multiple staff / Civil Service fast streamer / Similar devices) []  - 0 Simple Staple / Suture removal (25 or less) []  - 0 Complex Staple / Suture removal (26 or more) []  - 0 Hypo / Hyperglycemic Management (close monitor of Blood Glucose) []  - 0 Ankle / Brachial Index (ABI) - do not check if billed separately X- 1 5 Vital Signs Has the patient been seen at the hospital within the last three years: Yes Total Score: 115 Level Of Care: New/Established - Level 3 Electronic Signature(s) Signed: 08/12/2020 5:35:07 PM By: Rhae Hammock RN Entered By: Rhae Hammock on 08/12/2020 16:21:58 -------------------------------------------------------------------------------- Encounter Discharge Information Details Patient Name: Date of Service: [facility] [name], [name] 08/12/2020 3:15 PM Medical Record Number: [id number] Patient Account Number: [id number] Date of Birth/Sex: Treating RN: [birth year] (87 y.o. [name] Primary Care Jamison Soward: PA Haig Prophet, NO Other Clinician: Referring Cono Gebhard: Treating Aynsley Fleet/Extender: Frederick Peers in Treatment: 42 Encounter Discharge Information Items Discharge Condition: Stable Ambulatory Status: Wheelchair Discharge Destination: Home Transportation: Private Auto Accompanied By: daughter Schedule Follow-up Appointment: Yes Clinical Summary of Care: Patient Declined Electronic Signature(s) Signed: 08/14/2020 5:28:50 PM By: Levan Hurst RN, BSN Entered By: Levan Hurst on 08/12/2020 17:58:09 -------------------------------------------------------------------------------- Multi Wound Chart Details Patient Name: Date of Service: [facility] [name], [name] 08/12/2020 3:15 PM Medical Record Number: [id number] Patient Account Number: [id number] Date of Birth/Sex: Treating RN: 06/25/1922 (87 y.o. Burnadette Pop, Lauren Primary Care Jenese Mischke: PA TIENT, NO Other Clinician: Referring Jasa Dundon: Treating Halsey Hammen/Extender: Frederick Peers in Treatment: 42 Vital Signs Height(in): 71 Pulse(bpm): 122 Weight(lbs): 175 Blood Pressure(mmHg): 117/67 Body Mass Index(BMI): 24 Temperature(F): 97.8 Respiratory Rate(breaths/min): 18 Photos: [1:No Photos Sacrum] [N/A:N/A N/A] Wound Location: [1:Pressure Injury] [N/A:N/A] Wounding Event: [1:Pressure Ulcer] [N/A:N/A] Primary Etiology: [1:Glaucoma, Chronic Obstructive] [N/A:N/A] Comorbid History: [1:Pulmonary Disease (COPD), Coronary Artery Disease, Hypertension, Type II Diabetes, Osteoarthritis, Dementia 09/03/2019] [N/A:N/A] Date Acquired: [1:42] [N/A:N/A] Weeks of Treatment: [1:Open] [N/A:N/A] Wound Status: [1:3x2.4x1.1] [N/A:N/A] Measurements L x W x D (cm) [1:5.655] [N/A:N/A] A (cm) : rea [1:6.22] [N/A:N/A] Volume (cm) : [1:35.10%] [N/A:N/A] % Reduction in A rea: [1:58.00%] [N/A:N/A] % Reduction in Volume: [1:11] Starting Position 1 (o'clock): [1:6] Ending Position 1 (o'clock): [1:1.9] Maximum Distance 1 (cm): [1:Yes] [N/A:N/A] Undermining: [1:Category/Stage IV] [N/A:N/A] Classification:  [1:Medium] [N/A:N/A] Exudate A mount: [1:Serosanguineous] [N/A:N/A] Exudate Type: [1:red, brown] [N/A:N/A] Exudate Color: [1:Well defined, not attached] [N/A:N/A] Wound Margin: [1:Large (67-100%)] [N/A:N/A] Granulation A mount: [1:Red] [N/A:N/A] Granulation Quality: [1:Small (1-33%)] [N/A:N/A] Necrotic A mount: [1:Fat Layer (Subcutaneous Tissue): Yes N/A] Exposed Structures: [1:Bone: Yes Fascia: No Tendon: No Muscle: No Joint: No Small (1-33%)] [N/A:N/A] Treatment Notes Electronic Signature(s) Signed: 08/12/2020 5:07:05 PM By: Linton Ham MD Signed: 08/12/2020 5:35:07 PM By: Rhae Hammock RN Entered By: Linton Ham on 08/12/2020  16:54:10 -------------------------------------------------------------------------------- Multi-Disciplinary Care Plan Details Patient Name: Date of Service: [name], [name] 08/12/2020 3:15 PM Medical Record Number: [id number] Patient Account Number: [id number] Date of Birth/Sex: Treating RN: June 12, 1922 (87 y.o. Burnadette Pop, Lauren Primary Care Ming Mcmannis: PA Haig Prophet, NO Other Clinician: Referring Gregg Holster: Treating Andri Prestia/Extender: Frederick Peers in Treatment: 42 Active Inactive Wound/Skin Impairment Nursing Diagnoses: Impaired tissue integrity Knowledge deficit related to ulceration/compromised skin integrity Goals: Patient/caregiver will verbalize understanding of skin care regimen Date Initiated: 10/17/2019 Target Resolution Date: 08/29/2020 Goal Status: Active Ulcer/skin breakdown will have a volume reduction of 30% by week 4 Date Initiated: 10/17/2019 Date Inactivated: 12/28/2019 Target Resolution Date: 12/28/2019 Goal Status: Unmet Unmet Reason: chronic wound Interventions: Assess patient/caregiver ability to obtain necessary supplies Assess patient/caregiver ability to perform ulcer/skin care regimen upon admission and as needed Assess ulceration(s) every visit Provide education on ulcer and skin care Treatment  Activities: Skin care regimen initiated : 10/17/2019 Topical wound management initiated : 10/17/2019 Notes: Electronic Signature(s) Signed: 08/12/2020 5:35:07 PM By: Rhae Hammock RN Entered By: Rhae Hammock on 08/12/2020 16:13:53 -------------------------------------------------------------------------------- Pain Assessment Details Patient Name: Date of Service: [name], [name] 08/12/2020 3:15 PM Medical Record Number: [id number] Patient Account Number: [id number] Date of Birth/Sex: Treating RN: September 20, 1921 (87 y.o. [name] Primary Care Annastacia Duba: PA Haig Prophet, NO Other Clinician: Referring Stormi Vandevelde: Treating Toivo Bordon/Extender: Frederick Peers in Treatment: 42 Active Problems Location of Pain Severity and Description of Pain Patient Has Paino No Site Locations Pain Management and Medication Current Pain Management: Electronic Signature(s) Signed: 08/14/2020 5:28:50 PM By: Levan Hurst RN, BSN Entered By: Levan Hurst on 08/12/2020 15:32:50 -------------------------------------------------------------------------------- Patient/Caregiver Education Details Patient Name: Date of Service: [facility] 1/11/2022andnbsp3:15 PM Medical Record Number: [id number] Patient Account Number: [id number] Date of Birth/Gender: Treating RN: [birth year] (87 y.o. Erie Noe Primary Care Physician: PA Haig Prophet, NO Other Clinician: Referring Physician: Treating Physician/Extender: Frederick Peers in Treatment: 42 Education Assessment Education Provided To: Patient Education Topics Provided Wound/Skin Impairment: Methods: Explain/Verbal Responses: State content correctly Electronic Signature(s) Signed: 08/12/2020 5:35:07 PM By: Rhae Hammock RN Entered By: Rhae Hammock on 08/12/2020 16:14:08 -------------------------------------------------------------------------------- Wound Assessment Details Patient Name: Date  of Service: [facility] [name], [name] 08/12/2020 3:15 PM Medical Record Number: [id number] Patient Account Number: [id number] Date of Birth/Sex: Treating RN: [birth year] (87 y.o. [name] Primary Care Catera Hankins: PA TIENT, NO Other Clinician: Referring Fronie Holstein: Treating Cyncere Ruhe/Extender: Frederick Peers in Treatment: 42 Wound Status Wound Number: 1 Primary Pressure Ulcer Etiology: Wound Location: Sacrum Wound Open Wounding Event: Pressure Injury Status: Date Acquired: 09/03/2019 Comorbid Glaucoma, Chronic Obstructive Pulmonary Disease (COPD), Weeks Of Treatment: 42 History: Coronary Artery Disease, Hypertension, Type II Diabetes, Clustered Wound: No Osteoarthritis, Dementia Photos Photo Uploaded By: Mikeal Hawthorne on 08/14/2020 13:13:03 Wound Measurements Length: (cm) 3 Width: (cm) 2.4 Depth: (cm) 1.1 Area: (cm) 5.655 Volume: (cm) 6.22 % Reduction in Area: 35.1% % Reduction in Volume: 58% Epithelialization: Small (1-33%) Tunneling: No Undermining: Yes Starting Position (o'clock): 11 Ending Position (o'clock): 6 Maximum Distance: (cm) 1.9 Wound Description Classification: Category/Stage IV Wound Margin: Well defined, not attached Exudate Amount: Medium Exudate Type: Serosanguineous Exudate Color: red, brown Wound Bed Granulation Amount: Large (67-100%) Granulation Quality: Red Necrotic Amount: Small (1-33%) Necrotic Quality: Adherent Slough Foul Odor After Cleansing: No Slough/Fibrino Yes Exposed Structure Fascia Exposed: No Fat Layer (Subcutaneous Tissue) Exposed: Yes Tendon Exposed: No Muscle Exposed: No Joint Exposed: No Bone Exposed: Yes Treatment Notes Wound #1 (Sacrum)  Cleanser Normal Saline Discharge Instruction: Cleanse the wound with Normal Saline prior to applying a clean dressing using gauze sponges, not tissue or cotton balls. Peri-Wound Care Topical Primary Dressing Promogran Prisma Matrix, 4.34 (sq in) (silver  collagen) Discharge Instruction: moisten with normal saline Secondary Dressing Woven Gauze Sponge, Non-Sterile 4x4 in Discharge Instruction: moisten with normal saline apply over PRisma ComfortFoam Border, 3x3 in (silicone border) Discharge Instruction: Apply over primary dressing as directed. Secured With Compression Wrap Compression Stockings Environmental education officer) Signed: 08/14/2020 5:28:50 PM By: Levan Hurst RN, BSN Entered By: Levan Hurst on 08/12/2020 15:33:57 -------------------------------------------------------------------------------- Vitals Details Patient Name: Date of Service: [facility] [name], [name] 08/12/2020 3:15 PM Medical Record Number: [id number] Patient Account Number: [id number] Date of Birth/Sex: Treating RN: 11-09-1921 (87 y.o. [name] Primary Care Clance Baquero: PA TIENT, NO Other Clinician: Referring Dietrich Ke: Treating Dorman Calderwood/Extender: Frederick Peers in Treatment: 42 Vital Signs Time Taken: 15:32 Temperature (F): 97.8 Height (in): 71 Pulse (bpm): 122 Weight (lbs): 175 Respiratory Rate (breaths/min): 18 Body Mass Index (BMI): 24.4 Blood Pressure (mmHg): 117/67 Reference Range: 80 - 120 mg / dl Electronic Signature(s) Signed: 08/14/2020 5:28:50 PM By: Levan Hurst RN, BSN Entered By: Levan Hurst on 08/12/2020 15:32:38

## 2020-08-21 DIAGNOSIS — F0391 Unspecified dementia with behavioral disturbance: Secondary | ICD-10-CM | POA: Diagnosis not present

## 2020-08-21 DIAGNOSIS — I5032 Chronic diastolic (congestive) heart failure: Secondary | ICD-10-CM | POA: Diagnosis not present

## 2020-08-21 DIAGNOSIS — I69351 Hemiplegia and hemiparesis following cerebral infarction affecting right dominant side: Secondary | ICD-10-CM | POA: Diagnosis not present

## 2020-08-21 DIAGNOSIS — L89154 Pressure ulcer of sacral region, stage 4: Secondary | ICD-10-CM | POA: Diagnosis not present

## 2020-08-21 DIAGNOSIS — N183 Chronic kidney disease, stage 3 unspecified: Secondary | ICD-10-CM | POA: Diagnosis not present

## 2020-08-21 DIAGNOSIS — I130 Hypertensive heart and chronic kidney disease with heart failure and stage 1 through stage 4 chronic kidney disease, or unspecified chronic kidney disease: Secondary | ICD-10-CM | POA: Diagnosis not present

## 2020-08-21 DIAGNOSIS — Z7901 Long term (current) use of anticoagulants: Secondary | ICD-10-CM | POA: Diagnosis not present

## 2020-08-21 DIAGNOSIS — E1122 Type 2 diabetes mellitus with diabetic chronic kidney disease: Secondary | ICD-10-CM | POA: Diagnosis not present

## 2020-08-21 DIAGNOSIS — I2699 Other pulmonary embolism without acute cor pulmonale: Secondary | ICD-10-CM | POA: Diagnosis not present

## 2020-08-27 DIAGNOSIS — I5032 Chronic diastolic (congestive) heart failure: Secondary | ICD-10-CM | POA: Diagnosis not present

## 2020-08-27 DIAGNOSIS — Z7901 Long term (current) use of anticoagulants: Secondary | ICD-10-CM | POA: Diagnosis not present

## 2020-08-27 DIAGNOSIS — I2699 Other pulmonary embolism without acute cor pulmonale: Secondary | ICD-10-CM | POA: Diagnosis not present

## 2020-08-27 DIAGNOSIS — E1122 Type 2 diabetes mellitus with diabetic chronic kidney disease: Secondary | ICD-10-CM | POA: Diagnosis not present

## 2020-08-27 DIAGNOSIS — N183 Chronic kidney disease, stage 3 unspecified: Secondary | ICD-10-CM | POA: Diagnosis not present

## 2020-08-27 DIAGNOSIS — F0391 Unspecified dementia with behavioral disturbance: Secondary | ICD-10-CM | POA: Diagnosis not present

## 2020-08-27 DIAGNOSIS — I69351 Hemiplegia and hemiparesis following cerebral infarction affecting right dominant side: Secondary | ICD-10-CM | POA: Diagnosis not present

## 2020-08-27 DIAGNOSIS — L89154 Pressure ulcer of sacral region, stage 4: Secondary | ICD-10-CM | POA: Diagnosis not present

## 2020-08-27 DIAGNOSIS — I130 Hypertensive heart and chronic kidney disease with heart failure and stage 1 through stage 4 chronic kidney disease, or unspecified chronic kidney disease: Secondary | ICD-10-CM | POA: Diagnosis not present

## 2020-08-29 DIAGNOSIS — Z7901 Long term (current) use of anticoagulants: Secondary | ICD-10-CM | POA: Diagnosis not present

## 2020-08-29 DIAGNOSIS — I5032 Chronic diastolic (congestive) heart failure: Secondary | ICD-10-CM | POA: Diagnosis not present

## 2020-08-29 DIAGNOSIS — L89154 Pressure ulcer of sacral region, stage 4: Secondary | ICD-10-CM | POA: Diagnosis not present

## 2020-08-29 DIAGNOSIS — I130 Hypertensive heart and chronic kidney disease with heart failure and stage 1 through stage 4 chronic kidney disease, or unspecified chronic kidney disease: Secondary | ICD-10-CM | POA: Diagnosis not present

## 2020-08-29 DIAGNOSIS — I2699 Other pulmonary embolism without acute cor pulmonale: Secondary | ICD-10-CM | POA: Diagnosis not present

## 2020-08-29 DIAGNOSIS — E1122 Type 2 diabetes mellitus with diabetic chronic kidney disease: Secondary | ICD-10-CM | POA: Diagnosis not present

## 2020-08-29 DIAGNOSIS — I69351 Hemiplegia and hemiparesis following cerebral infarction affecting right dominant side: Secondary | ICD-10-CM | POA: Diagnosis not present

## 2020-08-29 DIAGNOSIS — N183 Chronic kidney disease, stage 3 unspecified: Secondary | ICD-10-CM | POA: Diagnosis not present

## 2020-08-29 DIAGNOSIS — F0391 Unspecified dementia with behavioral disturbance: Secondary | ICD-10-CM | POA: Diagnosis not present

## 2020-09-01 DIAGNOSIS — I69351 Hemiplegia and hemiparesis following cerebral infarction affecting right dominant side: Secondary | ICD-10-CM | POA: Diagnosis not present

## 2020-09-01 DIAGNOSIS — I5032 Chronic diastolic (congestive) heart failure: Secondary | ICD-10-CM | POA: Diagnosis not present

## 2020-09-01 DIAGNOSIS — I130 Hypertensive heart and chronic kidney disease with heart failure and stage 1 through stage 4 chronic kidney disease, or unspecified chronic kidney disease: Secondary | ICD-10-CM | POA: Diagnosis not present

## 2020-09-01 DIAGNOSIS — N183 Chronic kidney disease, stage 3 unspecified: Secondary | ICD-10-CM | POA: Diagnosis not present

## 2020-09-01 DIAGNOSIS — E1122 Type 2 diabetes mellitus with diabetic chronic kidney disease: Secondary | ICD-10-CM | POA: Diagnosis not present

## 2020-09-01 DIAGNOSIS — Z7901 Long term (current) use of anticoagulants: Secondary | ICD-10-CM | POA: Diagnosis not present

## 2020-09-01 DIAGNOSIS — F0391 Unspecified dementia with behavioral disturbance: Secondary | ICD-10-CM | POA: Diagnosis not present

## 2020-09-01 DIAGNOSIS — I2699 Other pulmonary embolism without acute cor pulmonale: Secondary | ICD-10-CM | POA: Diagnosis not present

## 2020-09-01 DIAGNOSIS — L89154 Pressure ulcer of sacral region, stage 4: Secondary | ICD-10-CM | POA: Diagnosis not present

## 2020-09-04 DIAGNOSIS — L89154 Pressure ulcer of sacral region, stage 4: Secondary | ICD-10-CM | POA: Diagnosis not present

## 2020-09-04 DIAGNOSIS — I2699 Other pulmonary embolism without acute cor pulmonale: Secondary | ICD-10-CM | POA: Diagnosis not present

## 2020-09-04 DIAGNOSIS — I130 Hypertensive heart and chronic kidney disease with heart failure and stage 1 through stage 4 chronic kidney disease, or unspecified chronic kidney disease: Secondary | ICD-10-CM | POA: Diagnosis not present

## 2020-09-04 DIAGNOSIS — Z7901 Long term (current) use of anticoagulants: Secondary | ICD-10-CM | POA: Diagnosis not present

## 2020-09-04 DIAGNOSIS — F0391 Unspecified dementia with behavioral disturbance: Secondary | ICD-10-CM | POA: Diagnosis not present

## 2020-09-04 DIAGNOSIS — N183 Chronic kidney disease, stage 3 unspecified: Secondary | ICD-10-CM | POA: Diagnosis not present

## 2020-09-04 DIAGNOSIS — I5032 Chronic diastolic (congestive) heart failure: Secondary | ICD-10-CM | POA: Diagnosis not present

## 2020-09-04 DIAGNOSIS — E1122 Type 2 diabetes mellitus with diabetic chronic kidney disease: Secondary | ICD-10-CM | POA: Diagnosis not present

## 2020-09-04 DIAGNOSIS — I69351 Hemiplegia and hemiparesis following cerebral infarction affecting right dominant side: Secondary | ICD-10-CM | POA: Diagnosis not present

## 2020-09-07 DIAGNOSIS — N183 Chronic kidney disease, stage 3 unspecified: Secondary | ICD-10-CM | POA: Diagnosis not present

## 2020-09-07 DIAGNOSIS — I2699 Other pulmonary embolism without acute cor pulmonale: Secondary | ICD-10-CM | POA: Diagnosis not present

## 2020-09-07 DIAGNOSIS — E1122 Type 2 diabetes mellitus with diabetic chronic kidney disease: Secondary | ICD-10-CM | POA: Diagnosis not present

## 2020-09-07 DIAGNOSIS — Z7901 Long term (current) use of anticoagulants: Secondary | ICD-10-CM | POA: Diagnosis not present

## 2020-09-07 DIAGNOSIS — I69351 Hemiplegia and hemiparesis following cerebral infarction affecting right dominant side: Secondary | ICD-10-CM | POA: Diagnosis not present

## 2020-09-07 DIAGNOSIS — L89154 Pressure ulcer of sacral region, stage 4: Secondary | ICD-10-CM | POA: Diagnosis not present

## 2020-09-07 DIAGNOSIS — I130 Hypertensive heart and chronic kidney disease with heart failure and stage 1 through stage 4 chronic kidney disease, or unspecified chronic kidney disease: Secondary | ICD-10-CM | POA: Diagnosis not present

## 2020-09-07 DIAGNOSIS — I5032 Chronic diastolic (congestive) heart failure: Secondary | ICD-10-CM | POA: Diagnosis not present

## 2020-09-07 DIAGNOSIS — F0391 Unspecified dementia with behavioral disturbance: Secondary | ICD-10-CM | POA: Diagnosis not present

## 2020-09-10 DIAGNOSIS — I5032 Chronic diastolic (congestive) heart failure: Secondary | ICD-10-CM | POA: Diagnosis not present

## 2020-09-10 DIAGNOSIS — Z7901 Long term (current) use of anticoagulants: Secondary | ICD-10-CM | POA: Diagnosis not present

## 2020-09-10 DIAGNOSIS — I69351 Hemiplegia and hemiparesis following cerebral infarction affecting right dominant side: Secondary | ICD-10-CM | POA: Diagnosis not present

## 2020-09-10 DIAGNOSIS — N183 Chronic kidney disease, stage 3 unspecified: Secondary | ICD-10-CM | POA: Diagnosis not present

## 2020-09-10 DIAGNOSIS — I2699 Other pulmonary embolism without acute cor pulmonale: Secondary | ICD-10-CM | POA: Diagnosis not present

## 2020-09-10 DIAGNOSIS — F0391 Unspecified dementia with behavioral disturbance: Secondary | ICD-10-CM | POA: Diagnosis not present

## 2020-09-10 DIAGNOSIS — E1122 Type 2 diabetes mellitus with diabetic chronic kidney disease: Secondary | ICD-10-CM | POA: Diagnosis not present

## 2020-09-10 DIAGNOSIS — I130 Hypertensive heart and chronic kidney disease with heart failure and stage 1 through stage 4 chronic kidney disease, or unspecified chronic kidney disease: Secondary | ICD-10-CM | POA: Diagnosis not present

## 2020-09-10 DIAGNOSIS — L89154 Pressure ulcer of sacral region, stage 4: Secondary | ICD-10-CM | POA: Diagnosis not present

## 2020-09-12 ENCOUNTER — Encounter (HOSPITAL_BASED_OUTPATIENT_CLINIC_OR_DEPARTMENT_OTHER): Payer: Medicare HMO | Attending: Internal Medicine | Admitting: Internal Medicine

## 2020-09-12 ENCOUNTER — Other Ambulatory Visit: Payer: Self-pay

## 2020-09-12 DIAGNOSIS — Z8673 Personal history of transient ischemic attack (TIA), and cerebral infarction without residual deficits: Secondary | ICD-10-CM | POA: Insufficient documentation

## 2020-09-12 DIAGNOSIS — E1151 Type 2 diabetes mellitus with diabetic peripheral angiopathy without gangrene: Secondary | ICD-10-CM | POA: Insufficient documentation

## 2020-09-12 DIAGNOSIS — R6889 Other general symptoms and signs: Secondary | ICD-10-CM | POA: Diagnosis not present

## 2020-09-12 DIAGNOSIS — L89154 Pressure ulcer of sacral region, stage 4: Secondary | ICD-10-CM | POA: Diagnosis not present

## 2020-09-12 NOTE — Progress Notes (Signed)
[name], [name] ([id number]) Visit Report for 09/12/2020 HPI Details Patient Name: Date of Service: [name], [name] 09/12/2020 1:15 PM Medical Record Number: [id number] Patient Account Number: [id number] Date of Birth/Sex: Treating RN: [birth year] (87 y.o. [name] Primary Care Provider: PA Haig Prophet, [place] Other Clinician: Referring Provider: Treating Provider/Extender: Frederick Peers in Treatment: 47 History of Present Illness HPI Description: /A3/17/2021 upon evaluation today patient presents for initial inspection here in our clinic concerning issues that has been having with a wound in the sacral region as well as the left heel. Fortunately neither appears to be overtly and significantly infected there is some necrotic tissue noted at both locations however that is going to be required to be addressed. He does have a history of diabetes mellitus type 2, vascular dementia, and hypertension. The patient did have a stroke in November 2020 unfortunately he has not been the same since that time according to his daughter. Prior to that he was taking care of himself now he is 100% dependent. He is at home with his daughter who is present during the office visit today as well that is where I get the majority of the history from at this point. Patient does have a history of hypertension as well which does not appear to be too out of control today which is good news. He does have a hemoglobin A1c of 6.05 May 2019. They currently have been using a calcium alginate dressing to the sacrum and it sounds like Skin-Prep and a foam on the heel. 4/9; pressure ulcers on the sacral area and left heel. Been using Anasept wet-to-dry on the sacrum and Hydrofera Blue in the heel. He has home health family is helping with the dressing. It sounds as though they are fairly religious about offloading these areas that have pressure relief surfaces for the bed. Wounds look better 4/30; I have not  seen this patient in 3 weeks. We have been using Anasept wet-to-dry the sacrum and Hydrofera Blue on the heel. When I saw this 3 weeks ago the sacrum actually look quite good and I thought that we might be able to transition him to a collagen-based dressing today. 5/10; the since the patient was last here he was hospitalized from 5/5 through 12/07/2019. He was noted to have a pressure injury of the sacral region stage IV. MRI did not suggest discitis or osteomyelitis. There was nonspecific bilateral paraspinal muscle edema. Culture I did when he was here the last time showed E. coli I believe they are aware of this. Blood cultures were negative. His white count was 10.8 on admission 11.9 at discharge his comprehensive metabolic panel notable for an albumin of 2.4 BUN of 32 with creatinine of 0.03 hemoglobin was only 7.3 with a discharge hemoglobin of 8.1 after dropping to a hemoglobin of 6.6 He is back at home. Discharge antibiotic was Augmentin.[name] He was treated with IV Rocephin in the hospital. The patient was seen by palliative care I have not looked at this consult. They have been using dressings that they had left over at home I think most recently [facility]. According to his daughter the patient is eating fairly well 12/28/19-Patient is back at 2 weeks, the sacral wound is about the same, the right heel has healed and closed. We are using silver alginate to the sacral wound 6/18; we continue to follow the sacral wound. His heel wounds have closed. We have been using silver alginate to the sacrum 7/1; sacral wound.  We have been using silver collagen with backing wet-to-dry. No open wounds on the heels. 7/15; some improvement in the dimensions of the sacral wound including the undermining area we have been using silver collagen with backing wet-to-dry dressings. We had some thoughts about trying a wound VAC. He has home health. 03/13/20-Sacral wound looks intact with healthy base, surrounding skin  intact, no maceration, he did unfortunately develop to abrasion wounds on his first second and third toes dorsum of the Right foot this happened when his foot got trapped below the wheelchair Footguard 8/30; sacral wound has come down slightly especially the undermining. We have been using a wound VAC She has 2 necrotic areas on the tip of the right first toe and the right second toe dorsally. 10/8 Is been about 5 weeks since I have seen this man. Family tells me that the sacrum really deteriorated over the last week or 2. The blame home health for some back issues. Dimensions here are a lot larger than last time including the undermining and there is now exposed bone. He had an MRI done in May when he was hospitalized at this area that did not show osteomyelitis nevertheless there is been a fairly significant deterioration since I last saw this wound. He has small abrasions on the right dorsal second and third toes which look as though they are progressing towards closure 10/22; the patient came in 2 weeks ago with exposed bone. Sample showed osteomyelitis culture Enterococcus. Given his frailty and the state of his dementia I elected to try to treat him with oral Augmentin currently on 500 3 times daily. I am going to try to keep him on this for 6 weeks. He is eating and drinking well taking the antibiotics well. So far no overt side effects 11/12; unfortunately the patient was away from clinic longer than I thought he was going to be in they ran out of Augmentin about a week ago. I'm going to renew this but reduce the dose somewhat to twice a day. His wound looks better we've been using silver alginate. No exposed bone today. 12/7; the patient is finished Augmentin had roughly 28 days. For some reason pharmacy will not fill it 2-week supply of liquid Augmentin, in any case I am finished giving him antibiotics. This was for the bone specimen that showed Enterococcus he has been on this for at least 6  weeks. Some improvement in wound area. There is still some exposed bone superiorly in the swollen 08/12/2020; 1 month follow-up. We treated him for underlying osteomyelitis. He is using silver collagen backing wet-to-dry. Advanced dementia precludes use of a wound VAC. I did talk with his daughter today about advanced treatment products but she is having to change the dressing too frequently during the week to make this possible. He is not a candidate for plastic surgery therefore I really think that the collagen backing wet-to- dry is the best option here. He has 1.1 cm in direct depth and 1.9 cm and undermining from 11-6 o'clock. 2/11; 1 month follow-up. The wound looks essentially the same. This does not probe to bone the base of it seems to have healthy granulation but in terms of the overall wound volume undermining I think this is all about the same. I went over the options last visit with his daughter which would include an advanced treatment product necessitating more frequent visits. Because of his dementia and agitation I do not think he is a candidate for wound VAC or  plastic surgery Electronic Signature(s) Signed: 09/12/2020 5:36:32 PM By: Linton Ham MD Entered By: Linton Ham on 09/12/2020 13:48:18 -------------------------------------------------------------------------------- Physical Exam Details Patient Name: Date of Service: [name], [name] 09/12/2020 1:15 PM Medical Record Number: [id number] Patient Account Number: [id number] Date of Birth/Sex: Treating RN: May 03, 1922 (87 y.o. [name] Primary Care Provider: PA Haig Prophet, [place] Other Clinician: Referring Provider: Treating Provider/Extender: Frederick Peers in Treatment: 47 Constitutional Sitting or standing Blood Pressure is within target range for patient.. Pulse regular and within target range for patient.[name] Respirations regular, non-labored and within target range.. Temperature is normal and  within the target range for the patient.[name] Appears in no distress. Frail man but no acute distress. Notes Wound exam sacral wound the tissue here looks healthy I think the undermining and depth are about the same. There is no palpable bone. Most significant undermining seems to be roughly from 11-2 o'clock. There is no evidence of surrounding infection Electronic Signature(s) Signed: 09/12/2020 5:36:32 PM By: Linton Ham MD Entered By: Linton Ham on 09/12/2020 13:49:16 -------------------------------------------------------------------------------- Physician Orders Details Patient Name: Date of Service: [address] [name], [name] 09/12/2020 1:15 PM Medical Record Number: [id number] Patient Account Number: [id number] Date of Birth/Sex: Treating RN: [birth year] (87 y.o. [name] Primary Care Provider: PA Haig Prophet, [place] Other Clinician: Referring Provider: Treating Provider/Extender: Frederick Peers in Treatment: 55 Verbal / Phone Orders: No Diagnosis Coding ICD-10 Coding Code Description L89.154 Pressure ulcer of sacral region, stage 4 M86.18 Other acute osteomyelitis, other site Follow-up Appointments Return appointment in 1 month. Bathing/ Shower/ Hygiene May shower and wash wound with soap and water. - on days dressing changed Edema Control - Lymphedema / SCD / Other Elevate legs to the level of the heart or above for 30 minutes daily and/or when sitting, a frequency of: Avoid standing for long periods of time. Exercise regularly Off-Loading Low air-loss mattress (Group 2) Turn and reposition every 2 hours Other: - place pillows under calf to float heels Home Health No change in wound care orders this week; continue Home Health for wound care. May utilize formulary equivalent dressing for wound treatment orders unless otherwise specified. Other Home Health Orders/Instructions: - [facility] Wound Treatment Wound #1 - Sacrum Cleanser: Normal Saline (Generic)  Other:3 times per week and when soiled/30 Days Discharge Instructions: Cleanse the wound with Normal Saline prior to applying a clean dressing using gauze sponges, not tissue or cotton balls. Prim Dressing: Promogran Prisma Matrix, 4.34 (sq in) (silver collagen) (Generic) Other:3 times per week and when soiled/30 Days ary Discharge Instructions: moisten with normal saline Secondary Dressing: Woven Gauze Sponge, Non-Sterile 4x4 in (Generic) Other:3 times per week and when soiled/30 Days Discharge Instructions: moisten with normal saline apply over PRisma Secondary Dressing: ComfortFoam Border, 3x3 in (silicone border) (Generic) Other:3 times per week and when soiled/30 Days Discharge Instructions: Apply over primary dressing as directed. Electronic Signature(s) Signed: 09/12/2020 5:36:32 PM By: Linton Ham MD Signed: 09/12/2020 5:49:38 PM By: Baruch Gouty RN, BSN Entered By: Baruch Gouty on 09/12/2020 17:17:55 -------------------------------------------------------------------------------- Problem List Details Patient Name: Date of Service: [facility] [name], [name] 09/12/2020 1:15 PM Medical Record Number: [id number] Patient Account Number: [id number] Date of Birth/Sex: Treating RN: August 07, 1921 (87 y.o. 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 10/17/2019 10/17/2019 F01.50 Vascular dementia without behavioral disturbance 10/17/2019  10/17/2019 I10 Essential (primary) hypertension 10/17/2019 10/17/2019 L97.518 Non-pressure chronic ulcer of other part of right foot with other specified severity 03/31/2020 03/31/2020 L03.031 Cellulitis of right toe 03/31/2020 03/31/2020 Resolved Problems Electronic Signature(s) Signed: 09/12/2020 5:36:32 PM By: Linton Ham MD Entered By: Linton Ham on 09/12/2020 13:47:14 -------------------------------------------------------------------------------- Progress Note Details Patient Name: Date of Service: [address] [name], [name] 09/12/2020 1:15 PM Medical Record Number: [id number] Patient Account Number: [id number] Date of Birth/Sex: Treating RN: [birth year] (87 y.o. [name] Primary Care Provider: PA Haig Prophet, [place] Other Clinician: Referring Provider: Treating Provider/Extender: Frederick Peers in Treatment: 47 Subjective History of Present Illness (HPI) /A3/17/2021 upon evaluation today patient presents for initial inspection here in our clinic concerning issues that has been having with a wound in the sacral region as well as the left heel. Fortunately neither appears to be overtly and significantly infected there is some necrotic tissue noted at both locations however that is going to be required to be addressed. He does have a history of diabetes mellitus type 2, vascular dementia, and hypertension. The patient did have a stroke in November 2020 unfortunately he has not been the same since that time according to his daughter. Prior to that he was taking care of himself now he is 100% dependent. He is at home with his daughter who is present during the office visit today as well that is where I get the majority of the history from at this point. Patient does have a history of hypertension as well which does not appear to be too out of control today which is good news. He does have a hemoglobin A1c of 6.05 May 2019. They currently have been using a calcium alginate  dressing to the sacrum and it sounds like Skin-Prep and a foam on the heel. 4/9; pressure ulcers on the sacral area and left heel. Been using Anasept wet-to-dry on the sacrum and Hydrofera Blue in the heel. He has home health family is helping with the dressing. It sounds as though they are fairly religious about offloading these areas that have pressure relief surfaces for the bed. Wounds look better 4/30; I have not seen this patient in 3 weeks. We have been using Anasept wet-to-dry the sacrum and Hydrofera Blue on the heel. When I saw this 3 weeks ago the sacrum actually look quite good and I thought that we might be able to transition him to a collagen-based dressing today. 5/10; the since the patient was last here he was hospitalized from 5/5 through 12/07/2019. He was noted to have a pressure injury of the sacral region stage IV. MRI did not suggest discitis or osteomyelitis. There was nonspecific bilateral paraspinal muscle edema. Culture I did when he was here the last time showed E. coli I believe they are aware of this. Blood cultures were negative. His white count was 10.8 on admission 11.9 at discharge his comprehensive metabolic panel notable for an albumin of 2.4 BUN of 32 with creatinine of 0.03 hemoglobin was only 7.3 with a discharge hemoglobin of 8.1 after dropping to a hemoglobin of 6.6 He is back at home. Discharge antibiotic was Augmentin.[name] He was treated with IV Rocephin in the hospital. The patient  was seen by palliative care I have not looked at this consult. They have been using dressings that they had left over at home I think most recently [facility]. According to his daughter the patient is eating fairly well 12/28/19-Patient is back at 2 weeks, the sacral wound is about the same, the right heel has healed and closed. We are using silver alginate to the sacral wound 6/18; we continue to follow the sacral wound. His heel wounds have closed. We have been using silver  alginate to the sacrum 7/1; sacral wound. We have been using silver collagen with backing wet-to-dry. No open wounds on the heels. 7/15; some improvement in the dimensions of the sacral wound including the undermining area we have been using silver collagen with backing wet-to-dry dressings. We had some thoughts about trying a wound VAC. He has home health. 03/13/20-Sacral wound looks intact with healthy base, surrounding skin intact, no maceration, he did unfortunately develop to abrasion wounds on his first second and third toes dorsum of the Right foot this happened when his foot got trapped below the wheelchair Footguard 8/30; sacral wound has come down slightly especially the undermining. We have been using a wound VAC ooShe has 2 necrotic areas on the tip of the right first toe and the right second toe dorsally. 10/8 Is been about 5 weeks since I have seen this man. Family tells me that the sacrum really deteriorated over the last week or 2. The blame home health for some back issues. Dimensions here are a lot larger than last time including the undermining and there is now exposed bone. He had an MRI done in May when he was hospitalized at this area that did not show osteomyelitis nevertheless there is been a fairly significant deterioration since I last saw this wound. He has small abrasions on the right dorsal second and third toes which look as though they are progressing towards closure 10/22; the patient came in 2 weeks ago with exposed bone. Sample showed osteomyelitis culture Enterococcus. Given his frailty and the state of his dementia I elected to try to treat him with oral Augmentin currently on 500 3 times daily. I am going to try to keep him on this for 6 weeks. He is eating and drinking well taking the antibiotics well. So far no overt side effects 11/12; unfortunately the patient was away from clinic longer than I thought he was going to be in they ran out of Augmentin about a  week ago. I'm going to renew this but reduce the dose somewhat to twice a day. His wound looks better we've been using silver alginate. No exposed bone today. 12/7; the patient is finished Augmentin had roughly 28 days. For some reason pharmacy will not fill it 2-week supply of liquid Augmentin, in any case I am finished giving him antibiotics. This was for the bone specimen that showed Enterococcus he has been on this for at least 6 weeks. Some improvement in wound area. There is still some exposed bone superiorly in the swollen 08/12/2020; 1 month follow-up. We treated him for underlying osteomyelitis. He is using silver collagen backing wet-to-dry. Advanced dementia precludes use of a wound VAC. I did talk with his daughter today about advanced treatment products but she is having to change the dressing too frequently during the week to make this possible. He is not a candidate for plastic surgery therefore I really think that the collagen backing wet-to- dry is the best option here. He has  1.1 cm in direct depth and 1.9 cm and undermining from 11-6 o'clock. 2/11; 1 month follow-up. The wound looks essentially the same. This does not probe to bone the base of it seems to have healthy granulation but in terms of the overall wound volume undermining I think this is all about the same. I went over the options last visit with his daughter which would include an advanced treatment product necessitating more frequent visits. Because of his dementia and agitation I do not think he is a candidate for wound VAC or plastic surgery Objective Constitutional Sitting or standing Blood Pressure is within target range for patient.. Pulse regular and within target range for patient.[name] Respirations regular, non-labored and within target range.. Temperature is normal and within the target range for the patient.[name] Appears in no distress. Frail man but no acute distress. Vitals Time Taken: 12:55 PM, Height: 71 in,  Weight: 175 lbs, BMI: 24.4, Temperature: 98.2 F, Pulse: 97 bpm, Respiratory Rate: 20 breaths/min, Blood Pressure: 116/61 mmHg. General Notes: Wound exam sacral wound the tissue here looks healthy I think the undermining and depth are about the same. There is no palpable bone. Most significant undermining seems to be roughly from 11-2 o'clock. There is no evidence of surrounding infection Integumentary (Hair, Skin) Wound #1 status is Open. Original cause of wound was Pressure Injury. The wound is located on the Sacrum. The wound measures 3.2cm length x 2.5cm width x 1.4cm depth; 6.283cm^2 area and 8.796cm^3 volume. There is bone and Fat Layer (Subcutaneous Tissue) exposed. There is no tunneling noted, however, there is undermining starting at 12:00 and ending at 5:00 with a maximum distance of 2.5cm. There is a medium amount of serosanguineous drainage noted. The wound margin is well defined and not attached to the wound base. There is large (67-100%) red granulation within the wound bed. There is no necrotic tissue within the wound bed. Assessment Active Problems ICD-10 Pressure ulcer of sacral region, stage 4 Other acute osteomyelitis, other site Plan Follow-up Appointments: Return appointment in 1 month. Bathing/ Shower/ Hygiene: May shower and wash wound with soap and water. - on days dressing changed Edema Control - Lymphedema / SCD / Other: Elevate legs to the level of the heart or above for 30 minutes daily and/or when sitting, a frequency of: Avoid standing for long periods of time. Exercise regularly Off-Loading: Low air-loss mattress (Group 2) Turn and reposition every 2 hours Other: - place pillows under calf to float heels Home Health: No change in wound care orders this week; continue Home Health for wound care. May utilize formulary equivalent dressing for wound treatment orders unless otherwise specified. Other Home Health Orders/Instructions: - [facility] WOUND #1: -  Sacrum Wound Laterality: Cleanser: Normal Saline (Generic) Other:3 times per week and when soiled/30 Days Discharge Instructions: Cleanse the wound with Normal Saline prior to applying a clean dressing using gauze sponges, not tissue or cotton balls. Prim Dressing: Promogran Prisma Matrix, 4.34 (sq in) (silver collagen) (Generic) Other:3 times per week and when soiled/30 Days ary Discharge Instructions: moisten with normal saline Secondary Dressing: Woven Gauze Sponge, Non-Sterile 4x4 in (Generic) Other:3 times per week and when soiled/30 Days Discharge Instructions: moisten with normal saline apply over PRisma Secondary Dressing: ComfortFoam Border, 3x3 in (silicone border) (Generic) Other:3 times per week and when soiled/30 Days Discharge Instructions: Apply over primary dressing as directed. 1. Continue with Promogran under a backing wet-to-dry. 2. I do not believe there is any options for more a more aggressive course  of care. 3. Palliative approach here Electronic Signature(s) Signed: 09/12/2020 5:36:32 PM By: Linton Ham MD Signed: 09/12/2020 5:49:38 PM By: Baruch Gouty RN, BSN Entered By: Baruch Gouty on 09/12/2020 17:18:14 -------------------------------------------------------------------------------- SuperBill Details Patient Name: Date of Service: [facility] [name], [name] 09/12/2020 Medical Record Number: [id number] Patient Account Number: [id number] Date of Birth/Sex: Treating RN: [birth year] (87 y.o. [name] Primary Care Provider: PA Haig Prophet, [place] Other Clinician: Referring Provider: Treating Provider/Extender: Frederick Peers in Treatment: 47 Diagnosis Coding ICD-10 Codes Code Description L89.154 Pressure ulcer of sacral region, stage 4 M86.18 Other acute osteomyelitis, other site Facility Procedures CPT4 Code: [id number] Description: [facility] VISIT-LEV 3 EST PT Modifier: Quantity: 1 Physician Procedures : CPT4 Code Description  Modifier [id number] - WC PHYS LEVEL 3 - EST PT ICD-10 Diagnosis Description L89.154 Pressure ulcer of sacral region, stage 4 Quantity: 1 Electronic Signature(s) Signed: 09/12/2020 5:36:32 PM By: Linton Ham MD Entered By: Linton Ham on 09/12/2020 13:50:47

## 2020-09-12 NOTE — Progress Notes (Signed)
[name], [name] ([id number]) Visit Report for 09/12/2020 Arrival Information Details Patient Name: Date of Service: [name], [name] 09/12/2020 1:15 PM Medical Record Number: [id number] Patient Account Number: [id number] Date of Birth/Sex: Treating RN: 08-Jul-1922 (87 y.o. [name] Primary Care Medford Staheli: PA Haig Prophet, NO Other Clinician: Referring Nancye Grumbine: Treating Brissa Asante/Extender: Frederick Peers in Treatment: 47 Visit Information History Since Last Visit Added or deleted any medications: No Patient Arrived: Wheel Chair Any new allergies or adverse reactions: No Arrival Time: 12:55 Had a fall or experienced change in No Accompanied By: daughter activities of daily living that may affect Transfer Assistance: Harrel Lemon Lift risk of falls: Patient Identification Verified: Yes Signs or symptoms of abuse/neglect since last visito No Secondary Verification Process Completed: Yes Hospitalized since last visit: No Patient Requires Transmission-Based Precautions: No Implantable device outside of the clinic excluding No Patient Has Alerts: No cellular tissue based products placed in the center since last visit: Has Dressing in Place as Prescribed: Yes Pain Present Now: No Electronic Signature(s) Signed: 09/12/2020 5:46:28 PM By: Deon Pilling Entered By: Deon Pilling on 09/12/2020 13:06:30 -------------------------------------------------------------------------------- Clinic Level of Care Assessment Details Patient Name: Date of Service: [name], [name] 09/12/2020 1:15 PM Medical Record Number: [id number] Patient Account Number: [id number] Date of Birth/Sex: Treating RN: 1921/08/05 (87 y.o. [name] Primary Care Anabelen Kaminsky: PA Haig Prophet, [place] Other Clinician: Referring Boe Deans: Treating Josanna Hefel/Extender: Frederick Peers in Treatment: 47 Clinic Level of Care Assessment Items TOOL 4 Quantity Score []  - 0 Use when only an EandM is performed  on FOLLOW-UP visit ASSESSMENTS - Nursing Assessment / Reassessment X- 1 10 Reassessment of Co-morbidities (includes updates in patient status) X- 1 5 Reassessment of Adherence to Treatment Plan ASSESSMENTS - Wound and Skin A ssessment / Reassessment X - Simple Wound Assessment / Reassessment - one wound 1 5 []  - 0 Complex Wound Assessment / Reassessment - multiple wounds []  - 0 Dermatologic / Skin Assessment (not related to wound area) ASSESSMENTS - Focused Assessment []  - 0 Circumferential Edema Measurements - multi extremities []  - 0 Nutritional Assessment / Counseling / Intervention []  - 0 Lower Extremity Assessment (monofilament, tuning fork, pulses) []  - 0 Peripheral Arterial Disease Assessment (using hand held doppler) ASSESSMENTS - Ostomy and/or Continence Assessment and Care []  - 0 Incontinence Assessment and Management []  - 0 Ostomy Care Assessment and Management (repouching, etc.) PROCESS - Coordination of Care X - Simple Patient / Family Education for ongoing care 1 15 []  - 0 Complex (extensive) Patient / Family Education for ongoing care X- 1 10 Staff obtains Programmer, systems, Records, T Results / Process Orders est []  - 0 Staff telephones HHA, Nursing Homes / Clarify orders / etc []  - 0 Routine Transfer to another Facility (non-emergent condition) []  - 0 Routine Hospital Admission (non-emergent condition) []  - 0 New Admissions / Biomedical engineer / Ordering NPWT Apligraf, etc. , []  - 0 Emergency Hospital Admission (emergent condition) X- 1 10 Simple Discharge Coordination []  - 0 Complex (extensive) Discharge Coordination PROCESS - Special Needs []  - 0 Pediatric / Minor Patient Management []  - 0 Isolation Patient Management []  - 0 Hearing / Language / Visual special needs []  - 0 Assessment of Community assistance (transportation, D/C planning, etc.) []  - 0 Additional assistance / Altered mentation []  - 0 Support Surface(s) Assessment (bed,  cushion, seat, etc.) INTERVENTIONS - Wound Cleansing / Measurement X - Simple Wound Cleansing - one wound 1 5 []  - 0 Complex Wound Cleansing - multiple  wounds X- 1 5 Wound Imaging (photographs - any number of wounds) []  - 0 Wound Tracing (instead of photographs) X- 1 5 Simple Wound Measurement - one wound []  - 0 Complex Wound Measurement - multiple wounds INTERVENTIONS - Wound Dressings X - Small Wound Dressing one or multiple wounds 1 10 []  - 0 Medium Wound Dressing one or multiple wounds []  - 0 Large Wound Dressing one or multiple wounds X- 1 5 Application of Medications - topical []  - 0 Application of Medications - injection INTERVENTIONS - Miscellaneous []  - 0 External ear exam []  - 0 Specimen Collection (cultures, biopsies, blood, body fluids, etc.) []  - 0 Specimen(s) / Culture(s) sent or taken to Lab for analysis X- 1 10 Patient Transfer (multiple staff / Civil Service fast streamer / Similar devices) []  - 0 Simple Staple / Suture removal (25 or less) []  - 0 Complex Staple / Suture removal (26 or more) []  - 0 Hypo / Hyperglycemic Management (close monitor of Blood Glucose) []  - 0 Ankle / Brachial Index (ABI) - do not check if billed separately X- 1 5 Vital Signs Has the patient been seen at the hospital within the last three years: Yes Total Score: 100 Level Of Care: New/Established - Level 3 Electronic Signature(s) Signed: 09/12/2020 5:49:38 PM By: Baruch Gouty RN, BSN Entered By: Baruch Gouty on 09/12/2020 13:43:09 -------------------------------------------------------------------------------- Lower Extremity Assessment Details Patient Name: Date of Service: [name], [name] 09/12/2020 1:15 PM Medical Record Number: [id number] Patient Account Number: [id number] Date of Birth/Sex: Treating RN: 08-05-1921 (87 y.o. [name] Primary Care Leticia Coletta: PA Haig Prophet, NO Other Clinician: Referring Markies Mowatt: Treating March Steyer/Extender: Frederick Peers  in Treatment: 47 Electronic Signature(s) Signed: 09/12/2020 5:46:28 PM By: Deon Pilling Entered By: Deon Pilling on 09/12/2020 13:07:06 -------------------------------------------------------------------------------- Multi Wound Chart Details Patient Name: Date of Service: [name], [name] 09/12/2020 1:15 PM Medical Record Number: [id number] Patient Account Number: [id number] Date of Birth/Sex: Treating RN: [birth year] (87 y.o. [name] Primary Care Gayla Benn: PA Haig Prophet, [place] Other Clinician: Referring Gaudencio Chesnut: Treating Guilianna Mckoy/Extender: Frederick Peers in Treatment: 47 Vital Signs Height(in): 71 Pulse(bpm): 97 Weight(lbs): 175 Blood Pressure(mmHg): 116/61 Body Mass Index(BMI): 24 Temperature(F): 98.2 Respiratory Rate(breaths/min): 20 Photos: [1:No Photos Sacrum] [N/A:N/A N/A] Wound Location: [1:Pressure Injury] [N/A:N/A] Wounding Event: [1:Pressure Ulcer] [N/A:N/A] Primary Etiology: [1:Glaucoma, Chronic Obstructive] [N/A:N/A] Comorbid History: [1:Pulmonary Disease (COPD), Coronary Artery Disease, Hypertension, Type II Diabetes, Osteoarthritis, Dementia 09/03/2019] [N/A:N/A] Date Acquired: [1:47] [N/A:N/A] Weeks of Treatment: [1:Open] [N/A:N/A] Wound Status: [1:3.2x2.5x1.4] [N/A:N/A] Measurements L x W x D (cm) [1:6.283] [N/A:N/A] A (cm) : rea [1:8.796] [N/A:N/A] Volume (cm) : [1:27.90%] [N/A:N/A] % Reduction in A rea: [1:40.60%] [N/A:N/A] % Reduction in Volume: [1:12] Starting Position 1 (o'clock): [1:5] Ending Position 1 (o'clock): [1:2.5] Maximum Distance 1 (cm): [1:Yes] [N/A:N/A] Undermining: [1:Category/Stage IV] [N/A:N/A] Classification: [1:Medium] [N/A:N/A] Exudate A mount: [1:Serosanguineous] [N/A:N/A] Exudate Type: [1:red, brown] [N/A:N/A] Exudate Color: [1:Well defined, not attached] [N/A:N/A] Wound Margin: [1:Large (67-100%)] [N/A:N/A] Granulation A mount: [1:Red] [N/A:N/A] Granulation Quality: [1:None Present (0%)]  [N/A:N/A] Necrotic A mount: [1:Fat Layer (Subcutaneous Tissue): Yes N/A] Exposed Structures: [1:Bone: Yes Fascia: No Tendon: No Muscle: No Joint: No Small (1-33%)] [N/A:N/A] Treatment Notes Electronic Signature(s) Signed: 09/12/2020 5:36:32 PM By: Linton Ham MD Signed: 09/12/2020 5:49:38 PM By: Baruch Gouty RN, BSN Entered By: Linton Ham on 09/12/2020 13:47:21 -------------------------------------------------------------------------------- Multi-Disciplinary Care Plan Details Patient Name: Date of Service: [facility] [name], [name] 09/12/2020 1:15 PM Medical Record Number: [id number] Patient Account Number: [id number] Date of Birth/Sex:  Treating RN: February 02, 1922 (87 y.o. [name] Primary Care Hava Massingale: PA Haig Prophet, [place] Other Clinician: Referring Rylend Pietrzak: Treating Xzayvier Fagin/Extender: Frederick Peers in Treatment: 47 Active Inactive Wound/Skin Impairment Nursing Diagnoses: Impaired tissue integrity Knowledge deficit related to ulceration/compromised skin integrity Goals: Patient/caregiver will verbalize understanding of skin care regimen Date Initiated: 10/17/2019 Target Resolution Date: 09/26/2020 Goal Status: Active Ulcer/skin breakdown will have a volume reduction of 30% by week 4 Date Initiated: 10/17/2019 Date Inactivated: 12/28/2019 Target Resolution Date: 12/28/2019 Goal Status: Unmet Unmet Reason: chronic wound Interventions: Assess patient/caregiver ability to obtain necessary supplies Assess patient/caregiver ability to perform ulcer/skin care regimen upon admission and as needed Assess ulceration(s) every visit Provide education on ulcer and skin care Treatment Activities: Skin care regimen initiated : 10/17/2019 Topical wound management initiated : 10/17/2019 Notes: Electronic Signature(s) Signed: 09/12/2020 5:49:38 PM By: Baruch Gouty RN, BSN Entered By: Baruch Gouty on 09/12/2020  13:41:09 -------------------------------------------------------------------------------- Pain Assessment Details Patient Name: Date of Service: [name], [name] 09/12/2020 1:15 PM Medical Record Number: [id number] Patient Account Number: [id number] Date of Birth/Sex: Treating RN: 1922-02-18 (87 y.o. [name] Primary Care Nicholos Aloisi: PA Haig Prophet, [place] Other Clinician: Referring Kaliyan Osbourn: Treating Marte Celani/Extender: Frederick Peers in Treatment: 47 Active Problems Location of Pain Severity and Description of Pain Patient Has Paino No Site Locations Rate the pain. Current Pain Level: 0 Pain Management and Medication Current Pain Management: Medication: No Cold Application: No Rest: No Massage: No Activity: No T.E.N.S.: No Heat Application: No Leg drop or elevation: No Is the Current Pain Management Adequate: Adequate How does your wound impact your activities of daily livingo Sleep: No Bathing: No Appetite: No Relationship With Others: No Bladder Continence: No Emotions: No Bowel Continence: No Work: No Toileting: No Drive: No Dressing: No Hobbies: No Electronic Signature(s) Signed: 09/12/2020 5:46:28 PM By: Deon Pilling Entered By: Deon Pilling on 09/12/2020 13:07:02 -------------------------------------------------------------------------------- Patient/Caregiver Education Details Patient Name: Date of Service: [name] 2/11/2022andnbsp1:15 PM Medical Record Number: [id number] Patient Account Number: [id number] Date of Birth/Gender: Treating RN: 1922/07/02 (87 y.o. [name] Primary Care Physician: PA Haig Prophet, [place] Other Clinician: Referring Physician: Treating Physician/Extender: Frederick Peers in Treatment: 59 Education Assessment Education Provided To: Patient Education Topics Provided Pressure: Methods: Explain/Verbal Responses: Reinforcements needed, State content correctly Wound/Skin  Impairment: Methods: Explain/Verbal Responses: Reinforcements needed, State content correctly Electronic Signature(s) Signed: 09/12/2020 5:49:38 PM By: Baruch Gouty RN, BSN Entered By: Baruch Gouty on 09/12/2020 13:41:33 -------------------------------------------------------------------------------- Wound Assessment Details Patient Name: Date of Service: [name], [name] 09/12/2020 1:15 PM Medical Record Number: [id number] Patient Account Number: [id number] Date of Birth/Sex: Treating RN: [birth year] (87 y.o. [name] Primary Care Analiza Cowger: PA TIENT, NO Other Clinician: Referring Jaylon Boylen: Treating Deveon Kisiel/Extender: Frederick Peers in Treatment: 47 Wound Status Wound Number: 1 Primary Pressure Ulcer Etiology: Wound Location: Sacrum Wound Open Wounding Event: Pressure Injury Status: Date Acquired: 09/03/2019 Comorbid Glaucoma, Chronic Obstructive Pulmonary Disease (COPD), Weeks Of Treatment: 47 History: Coronary Artery Disease, Hypertension, Type II Diabetes, Clustered Wound: No Osteoarthritis, Dementia Wound Measurements Length: (cm) 3.2 Width: (cm) 2.5 Depth: (cm) 1.4 Area: (cm) 6.283 Volume: (cm) 8.796 % Reduction in Area: 27.9% % Reduction in Volume: 40.6% Epithelialization: Small (1-33%) Tunneling: No Undermining: Yes Starting Position (o'clock): 12 Ending Position (o'clock): 5 Maximum Distance: (cm) 2.5 Wound Description Classification: Category/Stage IV Wound Margin: Well defined, not attached Exudate Amount: Medium Exudate Type: Serosanguineous Exudate Color: red, brown Foul Odor After Cleansing: No Slough/Fibrino  No Wound Bed Granulation Amount: Large (67-100%) Exposed Structure Granulation Quality: Red Fascia Exposed: No Necrotic Amount: None Present (0%) Fat Layer (Subcutaneous Tissue) Exposed: Yes Tendon Exposed: No Muscle Exposed: No Joint Exposed: No Bone Exposed: Yes Electronic Signature(s) Signed: 09/12/2020  5:46:28 PM By: Deon Pilling Entered By: Deon Pilling on 09/12/2020 13:07:42 -------------------------------------------------------------------------------- Vitals Details Patient Name: Date of Service: [name], PEMBER 09/12/2020 1:15 PM Medical Record Number: [id number] Patient Account Number: [id number] Date of Birth/Sex: Treating RN: 28-Jun-1922 (87 y.o. [name] Primary Care Cirilo Canner: PA TIENT, NO Other Clinician: Referring Ron Beske: Treating Makina Skow/Extender: Frederick Peers in Treatment: 47 Vital Signs Time Taken: 12:55 Temperature (F): 98.2 Height (in): 71 Pulse (bpm): 97 Weight (lbs): 175 Respiratory Rate (breaths/min): 20 Body Mass Index (BMI): 24.4 Blood Pressure (mmHg): 116/61 Reference Range: 80 - 120 mg / dl Electronic Signature(s) Signed: 09/12/2020 5:46:28 PM By: Deon Pilling Entered By: Deon Pilling on 09/12/2020 13:06:53

## 2020-09-16 DIAGNOSIS — I5032 Chronic diastolic (congestive) heart failure: Secondary | ICD-10-CM | POA: Diagnosis not present

## 2020-09-16 DIAGNOSIS — Z7901 Long term (current) use of anticoagulants: Secondary | ICD-10-CM | POA: Diagnosis not present

## 2020-09-16 DIAGNOSIS — F0391 Unspecified dementia with behavioral disturbance: Secondary | ICD-10-CM | POA: Diagnosis not present

## 2020-09-16 DIAGNOSIS — E1122 Type 2 diabetes mellitus with diabetic chronic kidney disease: Secondary | ICD-10-CM | POA: Diagnosis not present

## 2020-09-16 DIAGNOSIS — I2699 Other pulmonary embolism without acute cor pulmonale: Secondary | ICD-10-CM | POA: Diagnosis not present

## 2020-09-16 DIAGNOSIS — I130 Hypertensive heart and chronic kidney disease with heart failure and stage 1 through stage 4 chronic kidney disease, or unspecified chronic kidney disease: Secondary | ICD-10-CM | POA: Diagnosis not present

## 2020-09-16 DIAGNOSIS — L89154 Pressure ulcer of sacral region, stage 4: Secondary | ICD-10-CM | POA: Diagnosis not present

## 2020-09-16 DIAGNOSIS — I69351 Hemiplegia and hemiparesis following cerebral infarction affecting right dominant side: Secondary | ICD-10-CM | POA: Diagnosis not present

## 2020-09-16 DIAGNOSIS — N183 Chronic kidney disease, stage 3 unspecified: Secondary | ICD-10-CM | POA: Diagnosis not present

## 2020-09-18 DIAGNOSIS — Z7901 Long term (current) use of anticoagulants: Secondary | ICD-10-CM | POA: Diagnosis not present

## 2020-09-18 DIAGNOSIS — I69351 Hemiplegia and hemiparesis following cerebral infarction affecting right dominant side: Secondary | ICD-10-CM | POA: Diagnosis not present

## 2020-09-18 DIAGNOSIS — I2699 Other pulmonary embolism without acute cor pulmonale: Secondary | ICD-10-CM | POA: Diagnosis not present

## 2020-09-18 DIAGNOSIS — I130 Hypertensive heart and chronic kidney disease with heart failure and stage 1 through stage 4 chronic kidney disease, or unspecified chronic kidney disease: Secondary | ICD-10-CM | POA: Diagnosis not present

## 2020-09-18 DIAGNOSIS — L89154 Pressure ulcer of sacral region, stage 4: Secondary | ICD-10-CM | POA: Diagnosis not present

## 2020-09-18 DIAGNOSIS — I5032 Chronic diastolic (congestive) heart failure: Secondary | ICD-10-CM | POA: Diagnosis not present

## 2020-09-18 DIAGNOSIS — E1122 Type 2 diabetes mellitus with diabetic chronic kidney disease: Secondary | ICD-10-CM | POA: Diagnosis not present

## 2020-09-18 DIAGNOSIS — N183 Chronic kidney disease, stage 3 unspecified: Secondary | ICD-10-CM | POA: Diagnosis not present

## 2020-09-18 DIAGNOSIS — F0391 Unspecified dementia with behavioral disturbance: Secondary | ICD-10-CM | POA: Diagnosis not present

## 2020-09-22 DIAGNOSIS — L89154 Pressure ulcer of sacral region, stage 4: Secondary | ICD-10-CM | POA: Diagnosis not present

## 2020-09-22 DIAGNOSIS — I2699 Other pulmonary embolism without acute cor pulmonale: Secondary | ICD-10-CM | POA: Diagnosis not present

## 2020-09-22 DIAGNOSIS — F0391 Unspecified dementia with behavioral disturbance: Secondary | ICD-10-CM | POA: Diagnosis not present

## 2020-09-22 DIAGNOSIS — I69351 Hemiplegia and hemiparesis following cerebral infarction affecting right dominant side: Secondary | ICD-10-CM | POA: Diagnosis not present

## 2020-09-22 DIAGNOSIS — Z7901 Long term (current) use of anticoagulants: Secondary | ICD-10-CM | POA: Diagnosis not present

## 2020-09-22 DIAGNOSIS — N183 Chronic kidney disease, stage 3 unspecified: Secondary | ICD-10-CM | POA: Diagnosis not present

## 2020-09-22 DIAGNOSIS — I5032 Chronic diastolic (congestive) heart failure: Secondary | ICD-10-CM | POA: Diagnosis not present

## 2020-09-22 DIAGNOSIS — E1122 Type 2 diabetes mellitus with diabetic chronic kidney disease: Secondary | ICD-10-CM | POA: Diagnosis not present

## 2020-09-22 DIAGNOSIS — I130 Hypertensive heart and chronic kidney disease with heart failure and stage 1 through stage 4 chronic kidney disease, or unspecified chronic kidney disease: Secondary | ICD-10-CM | POA: Diagnosis not present

## 2020-09-25 DIAGNOSIS — I130 Hypertensive heart and chronic kidney disease with heart failure and stage 1 through stage 4 chronic kidney disease, or unspecified chronic kidney disease: Secondary | ICD-10-CM | POA: Diagnosis not present

## 2020-09-25 DIAGNOSIS — E1122 Type 2 diabetes mellitus with diabetic chronic kidney disease: Secondary | ICD-10-CM | POA: Diagnosis not present

## 2020-09-25 DIAGNOSIS — N183 Chronic kidney disease, stage 3 unspecified: Secondary | ICD-10-CM | POA: Diagnosis not present

## 2020-09-25 DIAGNOSIS — I5032 Chronic diastolic (congestive) heart failure: Secondary | ICD-10-CM | POA: Diagnosis not present

## 2020-09-25 DIAGNOSIS — Z7901 Long term (current) use of anticoagulants: Secondary | ICD-10-CM | POA: Diagnosis not present

## 2020-09-25 DIAGNOSIS — L89154 Pressure ulcer of sacral region, stage 4: Secondary | ICD-10-CM | POA: Diagnosis not present

## 2020-09-25 DIAGNOSIS — F0391 Unspecified dementia with behavioral disturbance: Secondary | ICD-10-CM | POA: Diagnosis not present

## 2020-09-25 DIAGNOSIS — I69351 Hemiplegia and hemiparesis following cerebral infarction affecting right dominant side: Secondary | ICD-10-CM | POA: Diagnosis not present

## 2020-09-25 DIAGNOSIS — I2699 Other pulmonary embolism without acute cor pulmonale: Secondary | ICD-10-CM | POA: Diagnosis not present

## 2020-10-01 DIAGNOSIS — L89154 Pressure ulcer of sacral region, stage 4: Secondary | ICD-10-CM | POA: Diagnosis not present

## 2020-10-01 DIAGNOSIS — F0391 Unspecified dementia with behavioral disturbance: Secondary | ICD-10-CM | POA: Diagnosis not present

## 2020-10-01 DIAGNOSIS — Z7901 Long term (current) use of anticoagulants: Secondary | ICD-10-CM | POA: Diagnosis not present

## 2020-10-01 DIAGNOSIS — I130 Hypertensive heart and chronic kidney disease with heart failure and stage 1 through stage 4 chronic kidney disease, or unspecified chronic kidney disease: Secondary | ICD-10-CM | POA: Diagnosis not present

## 2020-10-01 DIAGNOSIS — N183 Chronic kidney disease, stage 3 unspecified: Secondary | ICD-10-CM | POA: Diagnosis not present

## 2020-10-01 DIAGNOSIS — I2699 Other pulmonary embolism without acute cor pulmonale: Secondary | ICD-10-CM | POA: Diagnosis not present

## 2020-10-01 DIAGNOSIS — I5032 Chronic diastolic (congestive) heart failure: Secondary | ICD-10-CM | POA: Diagnosis not present

## 2020-10-01 DIAGNOSIS — E1122 Type 2 diabetes mellitus with diabetic chronic kidney disease: Secondary | ICD-10-CM | POA: Diagnosis not present

## 2020-10-01 DIAGNOSIS — I69351 Hemiplegia and hemiparesis following cerebral infarction affecting right dominant side: Secondary | ICD-10-CM | POA: Diagnosis not present

## 2020-10-02 DIAGNOSIS — L89154 Pressure ulcer of sacral region, stage 4: Secondary | ICD-10-CM | POA: Diagnosis not present

## 2020-10-02 DIAGNOSIS — Z7901 Long term (current) use of anticoagulants: Secondary | ICD-10-CM | POA: Diagnosis not present

## 2020-10-02 DIAGNOSIS — I69351 Hemiplegia and hemiparesis following cerebral infarction affecting right dominant side: Secondary | ICD-10-CM | POA: Diagnosis not present

## 2020-10-02 DIAGNOSIS — I5032 Chronic diastolic (congestive) heart failure: Secondary | ICD-10-CM | POA: Diagnosis not present

## 2020-10-02 DIAGNOSIS — N183 Chronic kidney disease, stage 3 unspecified: Secondary | ICD-10-CM | POA: Diagnosis not present

## 2020-10-02 DIAGNOSIS — F0391 Unspecified dementia with behavioral disturbance: Secondary | ICD-10-CM | POA: Diagnosis not present

## 2020-10-02 DIAGNOSIS — E1122 Type 2 diabetes mellitus with diabetic chronic kidney disease: Secondary | ICD-10-CM | POA: Diagnosis not present

## 2020-10-02 DIAGNOSIS — I130 Hypertensive heart and chronic kidney disease with heart failure and stage 1 through stage 4 chronic kidney disease, or unspecified chronic kidney disease: Secondary | ICD-10-CM | POA: Diagnosis not present

## 2020-10-02 DIAGNOSIS — I2699 Other pulmonary embolism without acute cor pulmonale: Secondary | ICD-10-CM | POA: Diagnosis not present

## 2020-10-06 DIAGNOSIS — I69351 Hemiplegia and hemiparesis following cerebral infarction affecting right dominant side: Secondary | ICD-10-CM | POA: Diagnosis not present

## 2020-10-06 DIAGNOSIS — N183 Chronic kidney disease, stage 3 unspecified: Secondary | ICD-10-CM | POA: Diagnosis not present

## 2020-10-06 DIAGNOSIS — I2699 Other pulmonary embolism without acute cor pulmonale: Secondary | ICD-10-CM | POA: Diagnosis not present

## 2020-10-06 DIAGNOSIS — E1122 Type 2 diabetes mellitus with diabetic chronic kidney disease: Secondary | ICD-10-CM | POA: Diagnosis not present

## 2020-10-06 DIAGNOSIS — Z7901 Long term (current) use of anticoagulants: Secondary | ICD-10-CM | POA: Diagnosis not present

## 2020-10-06 DIAGNOSIS — F0391 Unspecified dementia with behavioral disturbance: Secondary | ICD-10-CM | POA: Diagnosis not present

## 2020-10-06 DIAGNOSIS — I130 Hypertensive heart and chronic kidney disease with heart failure and stage 1 through stage 4 chronic kidney disease, or unspecified chronic kidney disease: Secondary | ICD-10-CM | POA: Diagnosis not present

## 2020-10-06 DIAGNOSIS — L89154 Pressure ulcer of sacral region, stage 4: Secondary | ICD-10-CM | POA: Diagnosis not present

## 2020-10-06 DIAGNOSIS — I5032 Chronic diastolic (congestive) heart failure: Secondary | ICD-10-CM | POA: Diagnosis not present

## 2020-10-08 DIAGNOSIS — F0391 Unspecified dementia with behavioral disturbance: Secondary | ICD-10-CM | POA: Diagnosis not present

## 2020-10-08 DIAGNOSIS — I69351 Hemiplegia and hemiparesis following cerebral infarction affecting right dominant side: Secondary | ICD-10-CM | POA: Diagnosis not present

## 2020-10-08 DIAGNOSIS — I5032 Chronic diastolic (congestive) heart failure: Secondary | ICD-10-CM | POA: Diagnosis not present

## 2020-10-08 DIAGNOSIS — Z7901 Long term (current) use of anticoagulants: Secondary | ICD-10-CM | POA: Diagnosis not present

## 2020-10-08 DIAGNOSIS — E1122 Type 2 diabetes mellitus with diabetic chronic kidney disease: Secondary | ICD-10-CM | POA: Diagnosis not present

## 2020-10-08 DIAGNOSIS — I2699 Other pulmonary embolism without acute cor pulmonale: Secondary | ICD-10-CM | POA: Diagnosis not present

## 2020-10-08 DIAGNOSIS — N183 Chronic kidney disease, stage 3 unspecified: Secondary | ICD-10-CM | POA: Diagnosis not present

## 2020-10-08 DIAGNOSIS — I130 Hypertensive heart and chronic kidney disease with heart failure and stage 1 through stage 4 chronic kidney disease, or unspecified chronic kidney disease: Secondary | ICD-10-CM | POA: Diagnosis not present

## 2020-10-08 DIAGNOSIS — L89154 Pressure ulcer of sacral region, stage 4: Secondary | ICD-10-CM | POA: Diagnosis not present

## 2020-10-10 ENCOUNTER — Encounter (HOSPITAL_BASED_OUTPATIENT_CLINIC_OR_DEPARTMENT_OTHER): Payer: Medicare HMO | Admitting: Physician Assistant

## 2020-10-13 DIAGNOSIS — L89154 Pressure ulcer of sacral region, stage 4: Secondary | ICD-10-CM | POA: Diagnosis not present

## 2020-10-13 DIAGNOSIS — E1122 Type 2 diabetes mellitus with diabetic chronic kidney disease: Secondary | ICD-10-CM | POA: Diagnosis not present

## 2020-10-13 DIAGNOSIS — I2699 Other pulmonary embolism without acute cor pulmonale: Secondary | ICD-10-CM | POA: Diagnosis not present

## 2020-10-13 DIAGNOSIS — I5032 Chronic diastolic (congestive) heart failure: Secondary | ICD-10-CM | POA: Diagnosis not present

## 2020-10-13 DIAGNOSIS — F0391 Unspecified dementia with behavioral disturbance: Secondary | ICD-10-CM | POA: Diagnosis not present

## 2020-10-13 DIAGNOSIS — I69351 Hemiplegia and hemiparesis following cerebral infarction affecting right dominant side: Secondary | ICD-10-CM | POA: Diagnosis not present

## 2020-10-13 DIAGNOSIS — N183 Chronic kidney disease, stage 3 unspecified: Secondary | ICD-10-CM | POA: Diagnosis not present

## 2020-10-13 DIAGNOSIS — I130 Hypertensive heart and chronic kidney disease with heart failure and stage 1 through stage 4 chronic kidney disease, or unspecified chronic kidney disease: Secondary | ICD-10-CM | POA: Diagnosis not present

## 2020-10-13 DIAGNOSIS — Z7901 Long term (current) use of anticoagulants: Secondary | ICD-10-CM | POA: Diagnosis not present

## 2020-10-16 ENCOUNTER — Encounter (HOSPITAL_BASED_OUTPATIENT_CLINIC_OR_DEPARTMENT_OTHER): Payer: Medicare HMO | Attending: Internal Medicine | Admitting: Internal Medicine

## 2020-10-16 ENCOUNTER — Other Ambulatory Visit: Payer: Self-pay

## 2020-10-16 DIAGNOSIS — F0150 Vascular dementia without behavioral disturbance: Secondary | ICD-10-CM | POA: Diagnosis not present

## 2020-10-16 DIAGNOSIS — M8618 Other acute osteomyelitis, other site: Secondary | ICD-10-CM | POA: Diagnosis not present

## 2020-10-16 DIAGNOSIS — I10 Essential (primary) hypertension: Secondary | ICD-10-CM | POA: Insufficient documentation

## 2020-10-16 DIAGNOSIS — E119 Type 2 diabetes mellitus without complications: Secondary | ICD-10-CM | POA: Insufficient documentation

## 2020-10-16 DIAGNOSIS — Z8673 Personal history of transient ischemic attack (TIA), and cerebral infarction without residual deficits: Secondary | ICD-10-CM | POA: Diagnosis not present

## 2020-10-16 DIAGNOSIS — R6889 Other general symptoms and signs: Secondary | ICD-10-CM | POA: Diagnosis not present

## 2020-10-16 DIAGNOSIS — L89154 Pressure ulcer of sacral region, stage 4: Secondary | ICD-10-CM | POA: Diagnosis not present

## 2020-10-16 NOTE — Progress Notes (Signed)
LEORY, ALLINSON ([id number]) Visit Report for 10/16/2020 Arrival Information Details Patient Name: Date of Service: REINER, LOEWEN 10/16/2020 2:45 PM Medical Record Number: [id number] Patient Account Number: [id number] Date of Birth/Sex: Treating RN: 1922-05-10 (87 y.o. [name] Primary Care Rylinn Linzy: PA Haig Prophet, NO Other Clinician: Referring Darien Mignogna: Treating Marguarite Markov/Extender: Frederick Peers in Treatment: 52 Visit Information History Since Last Visit Added or deleted any medications: No Patient Arrived: Wheel Chair Any new allergies or adverse reactions: No Arrival Time: 15:16 Had a fall or experienced change in No Accompanied By: daughter activities of daily living that may affect Transfer Assistance: Harrel Lemon Lift risk of falls: Patient Identification Verified: Yes Signs or symptoms of abuse/neglect since last visito No Secondary Verification Process Completed: Yes Hospitalized since last visit: No Patient Requires Transmission-Based Precautions: No Implantable device outside of the clinic excluding No Patient Has Alerts: No cellular tissue based products placed in the center since last visit: Has Dressing in Place as Prescribed: Yes Pain Present Now: No Electronic Signature(s) Signed: 10/16/2020 5:54:24 PM By: Levan Hurst RN, BSN Entered By: Levan Hurst on 10/16/2020 15:16:59 -------------------------------------------------------------------------------- Clinic Level of Care Assessment Details Patient Name: Date of Service: [facility] AADARSH, COZORT 10/16/2020 2:45 PM Medical Record Number: [id number] Patient Account Number: [id number] Date of Birth/Sex: Treating RN: 23-Feb-1922 (87 y.o. [name] Primary Care Hearl Heikes: PA Haig Prophet, NO Other Clinician: Referring Eleah Lahaie: Treating Kayanna Mckillop/Extender: Frederick Peers in Treatment: 52 Clinic Level of Care Assessment Items TOOL 4 Quantity Score X- 1 0 Use when only an EandM is  performed on FOLLOW-UP visit ASSESSMENTS - Nursing Assessment / Reassessment X- 1 10 Reassessment of Co-morbidities (includes updates in patient status) X- 1 5 Reassessment of Adherence to Treatment Plan ASSESSMENTS - Wound and Skin A ssessment / Reassessment X - Simple Wound Assessment / Reassessment - one wound 1 5 []  - 0 Complex Wound Assessment / Reassessment - multiple wounds X- 1 10 Dermatologic / Skin Assessment (not related to wound area) ASSESSMENTS - Focused Assessment []  - 0 Circumferential Edema Measurements - multi extremities X- 1 10 Nutritional Assessment / Counseling / Intervention []  - 0 Lower Extremity Assessment (monofilament, tuning fork, pulses) []  - 0 Peripheral Arterial Disease Assessment (using hand held doppler) ASSESSMENTS - Ostomy and/or Continence Assessment and Care []  - 0 Incontinence Assessment and Management []  - 0 Ostomy Care Assessment and Management (repouching, etc.) PROCESS - Coordination of Care X - Simple Patient / Family Education for ongoing care 1 15 []  - 0 Complex (extensive) Patient / Family Education for ongoing care X- 1 10 Staff obtains Programmer, systems, Records, T Results / Process Orders est X- 1 10 Staff telephones HHA, Nursing Homes / Clarify orders / etc []  - 0 Routine Transfer to another Facility (non-emergent condition) []  - 0 Routine Hospital Admission (non-emergent condition) []  - 0 New Admissions / Biomedical engineer / Ordering NPWT Apligraf, etc. , []  - 0 Emergency Hospital Admission (emergent condition) X- 1 10 Simple Discharge Coordination []  - 0 Complex (extensive) Discharge Coordination PROCESS - Special Needs []  - 0 Pediatric / Minor Patient Management []  - 0 Isolation Patient Management []  - 0 Hearing / Language / Visual special needs []  - 0 Assessment of Community assistance (transportation, D/C planning, etc.) []  - 0 Additional assistance / Altered mentation []  - 0 Support Surface(s) Assessment  (bed, cushion, seat, etc.) INTERVENTIONS - Wound Cleansing / Measurement X - Simple Wound Cleansing - one wound 1 5 []  - 0 Complex Wound Cleansing -  multiple wounds X- 1 5 Wound Imaging (photographs - any number of wounds) []  - 0 Wound Tracing (instead of photographs) X- 1 5 Simple Wound Measurement - one wound []  - 0 Complex Wound Measurement - multiple wounds INTERVENTIONS - Wound Dressings X - Small Wound Dressing one or multiple wounds 1 10 []  - 0 Medium Wound Dressing one or multiple wounds []  - 0 Large Wound Dressing one or multiple wounds []  - 0 Application of Medications - topical []  - 0 Application of Medications - injection INTERVENTIONS - Miscellaneous []  - 0 External ear exam []  - 0 Specimen Collection (cultures, biopsies, blood, body fluids, etc.) []  - 0 Specimen(s) / Culture(s) sent or taken to Lab for analysis X- 1 10 Patient Transfer (multiple staff / Harrel Lemon Lift / Similar devices) []  - 0 Simple Staple / Suture removal (25 or less) []  - 0 Complex Staple / Suture removal (26 or more) []  - 0 Hypo / Hyperglycemic Management (close monitor of Blood Glucose) []  - 0 Ankle / Brachial Index (ABI) - do not check if billed separately X- 1 5 Vital Signs Has the patient been seen at the hospital within the last three years: Yes Total Score: 125 Level Of Care: New/Established - Level 4 Electronic Signature(s) Signed: 10/16/2020 5:53:43 PM By: Deon Pilling Entered By: Deon Pilling on 10/16/2020 16:46:00 -------------------------------------------------------------------------------- Encounter Discharge Information Details Patient Name: Date of Service: [facility] MASTER, TOUCHET 10/16/2020 2:45 PM Medical Record Number: [id number] Patient Account Number: [id number] Date of Birth/Sex: Treating RN: September 21, 1921 (87 y.o. [name] Primary Care Caiden Arteaga: PA Haig Prophet, NO Other Clinician: Referring Meher Kucinski: Treating Marc Leichter/Extender: Frederick Peers  in Treatment: 52 Encounter Discharge Information Items Discharge Condition: Stable Ambulatory Status: Wheelchair Discharge Destination: Home Transportation: Private Auto Accompanied By: daughter Schedule Follow-up Appointment: Yes Clinical Summary of Care: Patient Declined Electronic Signature(s) Signed: 10/16/2020 5:54:24 PM By: Levan Hurst RN, BSN Entered By: Levan Hurst on 10/16/2020 16:48:09 -------------------------------------------------------------------------------- Multi Wound Chart Details Patient Name: Date of Service: [facility] ERHARDT, DADA 10/16/2020 2:45 PM Medical Record Number: [id number] Patient Account Number: [id number] Date of Birth/Sex: Treating RN: [birth year] (87 y.o. [name] Primary Care Halford Goetzke: PA Haig Prophet, NO Other Clinician: Referring Khalee Mazo: Treating Asa Baudoin/Extender: Frederick Peers in Treatment: 52 Vital Signs Height(in): 71 Pulse(bpm): 93 Weight(lbs): 175 Blood Pressure(mmHg): 123/61 Body Mass Index(BMI): 24 Temperature(F): 98.3 Respiratory Rate(breaths/min): 16 Photos: [1:No Photos Sacrum] [N/A:N/A N/A] Wound Location: [1:Pressure Injury] [N/A:N/A] Wounding Event: [1:Pressure Ulcer] [N/A:N/A] Primary Etiology: [1:Glaucoma, Chronic Obstructive] [N/A:N/A] Comorbid History: [1:Pulmonary Disease (COPD), Coronary Artery Disease, Hypertension, Type II Diabetes, Osteoarthritis, Dementia 09/03/2019] [N/A:N/A] Date Acquired: [1:52] [N/A:N/A] Weeks of Treatment: [1:Open] [N/A:N/A] Wound Status: [1:3.2x2.8x1.1] [N/A:N/A] Measurements L x W x D (cm) [1:7.037] [N/A:N/A] A (cm) : rea [1:7.741] [N/A:N/A] Volume (cm) : [1:19.30%] [N/A:N/A] % Reduction in A rea: [1:47.80%] [N/A:N/A] % Reduction in Volume: [1:12] Starting Position 1 (o'clock): [1:5] Ending Position 1 (o'clock): [1:2.5] Maximum Distance 1 (cm): [1:Yes] [N/A:N/A] Undermining: [1:Category/Stage IV] [N/A:N/A] Classification: [1:Medium] [N/A:N/A] Exudate A mount:  [1:Serosanguineous] [N/A:N/A] Exudate Type: [1:red, brown] [N/A:N/A] Exudate Color: [1:Well defined, not attached] [N/A:N/A] Wound Margin: [1:Large (67-100%)] [N/A:N/A] Granulation A mount: [1:Red, Pink] [N/A:N/A] Granulation Quality: [1:Small (1-33%)] [N/A:N/A] Necrotic A mount: [1:Fat Layer (Subcutaneous Tissue): Yes N/A] Exposed Structures: [1:Bone: Yes Fascia: No Tendon: No Muscle: No Joint: No Small (1-33%)] [N/A:N/A] Treatment Notes Electronic Signature(s) Signed: 10/16/2020 5:46:09 PM By: Linton Ham MD Signed: 10/16/2020 5:53:43 PM By: Deon Pilling Entered By: Linton Ham on 10/16/2020 16:21:28 --------------------------------------------------------------------------------  Multi-Disciplinary Care Plan Details Patient Name: Date of Service: ANDRUS, SHARP 10/16/2020 2:45 PM Medical Record Number: [id number] Patient Account Number: [id number] Date of Birth/Sex: Treating RN: Feb 20, 1922 (87 y.o. [name] Primary Care Jamarrion Budai: PA Haig Prophet, NO Other Clinician: Referring Antoine Vandermeulen: Treating Jennier Schissler/Extender: Frederick Peers in Treatment: [place] reviewed with physician Active Inactive Wound/Skin Impairment Nursing Diagnoses: Impaired tissue integrity Knowledge deficit related to ulceration/compromised skin integrity Goals: Patient/caregiver will verbalize understanding of skin care regimen Date Initiated: 10/17/2019 Target Resolution Date: 12/26/2020 Goal Status: Active Ulcer/skin breakdown will have a volume reduction of 30% by week 4 Date Initiated: 10/17/2019 Date Inactivated: 12/28/2019 Target Resolution Date: 12/28/2019 Goal Status: Unmet Unmet Reason: chronic wound Interventions: Assess patient/caregiver ability to obtain necessary supplies Assess patient/caregiver ability to perform ulcer/skin care regimen upon admission and as needed Assess ulceration(s) every visit Provide education on ulcer and skin  care Treatment Activities: Skin care regimen initiated : 10/17/2019 Topical wound management initiated : 10/17/2019 Notes: Electronic Signature(s) Signed: 10/16/2020 5:53:43 PM By: Deon Pilling Entered By: Deon Pilling on 10/16/2020 15:28:07 -------------------------------------------------------------------------------- Pain Assessment Details Patient Name: Date of Service: MANDELL, PANGBORN 10/16/2020 2:45 PM Medical Record Number: [id number] Patient Account Number: [id number] Date of Birth/Sex: Treating RN: 1921-09-04 (87 y.o. [name] Primary Care Afreen Siebels: PA Haig Prophet, NO Other Clinician: Referring Bryony Kaman: Treating Vencil Basnett/Extender: Frederick Peers in Treatment: 52 Active Problems Location of Pain Severity and Description of Pain Patient Has Paino No Site Locations Pain Management and Medication Current Pain Management: Electronic Signature(s) Signed: 10/16/2020 5:54:24 PM By: Levan Hurst RN, BSN Entered By: Levan Hurst on 10/16/2020 15:18:17 -------------------------------------------------------------------------------- Patient/Caregiver Education Details Patient Name: Date of Service: [facility] 3/17/2022andnbsp2:45 PM Medical Record Number: [id number] Patient Account Number: [id number] Date of Birth/Gender: Treating RN: 01-22-1922 (87 y.o. [name] Primary Care Physician: PA Haig Prophet, [place] Other Clinician: Referring Physician: Treating Physician/Extender: Frederick Peers in Treatment: 52 Education Assessment Education Provided To: Patient Education Topics Provided Wound/Skin Impairment: Handouts: Skin Care Do's and Dont's Methods: Explain/Verbal Responses: Reinforcements needed Electronic Signature(s) Signed: 10/16/2020 5:53:43 PM By: Deon Pilling Entered By: Deon Pilling on 10/16/2020 15:28:30 -------------------------------------------------------------------------------- Wound Assessment  Details Patient Name: Date of Service: YOSMAR, RYKER 10/16/2020 2:45 PM Medical Record Number: [id number] Patient Account Number: [id number] Date of Birth/Sex: Treating RN: [birth year] (87 y.o. [name] Primary Care Kylea Berrong: PA TIENT, NO Other Clinician: Referring Sayvion Vigen: Treating Keatyn Jawad/Extender: Frederick Peers in Treatment: 52 Wound Status Wound Number: 1 Primary Pressure Ulcer Etiology: Wound Location: Sacrum Wound Open Wounding Event: Pressure Injury Status: Date Acquired: 09/03/2019 Comorbid Glaucoma, Chronic Obstructive Pulmonary Disease (COPD), Weeks Of Treatment: 52 History: Coronary Artery Disease, Hypertension, Type II Diabetes, Clustered Wound: No Osteoarthritis, Dementia Photos Wound Measurements Length: (cm) 3.2 Width: (cm) 2.8 Depth: (cm) 1.1 Area: (cm) 7.037 Volume: (cm) 7.741 % Reduction in Area: 19.3% % Reduction in Volume: 47.8% Epithelialization: Small (1-33%) Tunneling: No Undermining: Yes Starting Position (o'clock): 12 Ending Position (o'clock): 5 Maximum Distance: (cm) 2.5 Wound Description Classification: Category/Stage IV Wound Margin: Well defined, not attached Exudate Amount: Medium Exudate Type: Serosanguineous Exudate Color: red, brown Foul Odor After Cleansing: No Slough/Fibrino Yes Wound Bed Granulation Amount: Large (67-100%) Exposed Structure Granulation Quality: Red, Pink Fascia Exposed: No Necrotic Amount: Small (1-33%) Fat Layer (Subcutaneous Tissue) Exposed: Yes Necrotic Quality: Adherent Slough Tendon Exposed: No Muscle Exposed: No Joint Exposed: No Bone Exposed: Yes Treatment Notes Wound #1 (Sacrum)  Cleanser Normal Saline Discharge Instruction: Cleanse the wound with Normal Saline prior to applying a clean dressing using gauze sponges, not tissue or cotton balls. Peri-Wound Care Topical Primary Dressing Promogran Prisma Matrix, 4.34 (sq in) (silver collagen) Discharge  Instruction: moisten with normal saline Secondary Dressing Woven Gauze Sponge, Non-Sterile 4x4 in Discharge Instruction: moisten with normal saline apply over PRisma ComfortFoam Border, 3x3 in (silicone border) Discharge Instruction: Apply over primary dressing as directed. Secured With Compression Wrap Compression Stockings Environmental education officer) Signed: 10/16/2020 5:41:28 PM By: Sandre Kitty Signed: 10/16/2020 5:54:24 PM By: Levan Hurst RN, BSN Entered By: Sandre Kitty on 10/16/2020 17:21:38 -------------------------------------------------------------------------------- Vitals Details Patient Name: Date of Service: [facility] BELEN, ZWAHLEN 10/16/2020 2:45 PM Medical Record Number: [id number] Patient Account Number: [id number] Date of Birth/Sex: Treating RN: Jun 10, 1922 (87 y.o. [name] Primary Care Jaliyah Fotheringham: PA TIENT, NO Other Clinician: Referring Ozias Dicenzo: Treating Melisssa Donner/Extender: Frederick Peers in Treatment: 52 Vital Signs Time Taken: 15:17 Temperature (F): 98.3 Height (in): 71 Pulse (bpm): 93 Weight (lbs): 175 Respiratory Rate (breaths/min): 16 Body Mass Index (BMI): 24.4 Blood Pressure (mmHg): 123/61 Reference Range: 80 - 120 mg / dl Electronic Signature(s) Signed: 10/16/2020 5:54:24 PM By: Levan Hurst RN, BSN Entered By: Levan Hurst on 10/16/2020 15:18:11

## 2020-10-16 NOTE — Progress Notes (Signed)
[name], [name] ([id number]) Visit Report for 10/16/2020 HPI Details Patient Name: Date of Service: AMAREON, PHUNG 10/16/2020 2:45 PM Medical Record Number: [id number] Patient Account Number: [id number] Date of Birth/Sex: Treating RN: July 25, 1922 (87 y.o. [name] Primary Care Provider: PA Haig Prophet, NO Other Clinician: Referring Provider: Treating Provider/Extender: Frederick Peers in Treatment: 52 History of Present Illness HPI Description: /A3/17/2021 upon evaluation today patient presents for initial inspection here in our clinic concerning issues that has been having with a wound in the sacral region as well as the left heel. Fortunately neither appears to be overtly and significantly infected there is some necrotic tissue noted at both locations however that is going to be required to be addressed. He does have a history of diabetes mellitus type 2, vascular dementia, and hypertension. The patient did have a stroke in November 2020 unfortunately he has not been the same since that time according to his daughter. Prior to that he was taking care of himself now he is 100% dependent. He is at home with his daughter who is present during the office visit today as well that is where I get the majority of the history from at this point. Patient does have a history of hypertension as well which does not appear to be too out of control today which is good news. He does have a hemoglobin A1c of 6.05 May 2019. They currently have been using a calcium alginate dressing to the sacrum and it sounds like Skin-Prep and a foam on the heel. 4/9; pressure ulcers on the sacral area and left heel. Been using Anasept wet-to-dry on the sacrum and Hydrofera Blue in the heel. He has home health family is helping with the dressing. It sounds as though they are fairly religious about offloading these areas that have pressure relief surfaces for the bed. Wounds look better 4/30; I have not  seen this patient in 3 weeks. We have been using Anasept wet-to-dry the sacrum and Hydrofera Blue on the heel. When I saw this 3 weeks ago the sacrum actually look quite good and I thought that we might be able to transition him to a collagen-based dressing today. 5/10; the since the patient was last here he was hospitalized from 5/5 through 12/07/2019. He was noted to have a pressure injury of the sacral region stage IV. MRI did not suggest discitis or osteomyelitis. There was nonspecific bilateral paraspinal muscle edema. Culture I did when he was here the last time showed E. coli I believe they are aware of this. Blood cultures were negative. His white count was 10.8 on admission 11.9 at discharge his comprehensive metabolic panel notable for an albumin of 2.4 BUN of 32 with creatinine of 0.03 hemoglobin was only 7.3 with a discharge hemoglobin of 8.1 after dropping to a hemoglobin of 6.6 He is back at home. Discharge antibiotic was Augmentin.[name] He was treated with IV Rocephin in the hospital. The patient was seen by palliative care I have not looked at this consult. They have been using dressings that they had left over at home I think most recently [facility]. According to his daughter the patient is eating fairly well 12/28/19-Patient is back at 2 weeks, the sacral wound is about the same, the right heel has healed and closed. We are using silver alginate to the sacral wound 6/18; we continue to follow the sacral wound. His heel wounds have closed. We have been using silver alginate to the sacrum 7/1; sacral wound.  We have been using silver collagen with backing wet-to-dry. No open wounds on the heels. 7/15; some improvement in the dimensions of the sacral wound including the undermining area we have been using silver collagen with backing wet-to-dry dressings. We had some thoughts about trying a wound VAC. He has home health. 03/13/20-Sacral wound looks intact with healthy base, surrounding skin  intact, no maceration, he did unfortunately develop to abrasion wounds on his first second and third toes dorsum of the Right foot this happened when his foot got trapped below the wheelchair Footguard 8/30; sacral wound has come down slightly especially the undermining. We have been using a wound VAC She has 2 necrotic areas on the tip of the right first toe and the right second toe dorsally. 10/8 Is been about 5 weeks since I have seen this man. Family tells me that the sacrum really deteriorated over the last week or 2. The blame home health for some back issues. Dimensions here are a lot larger than last time including the undermining and there is now exposed bone. He had an MRI done in May when he was hospitalized at this area that did not show osteomyelitis nevertheless there is been a fairly significant deterioration since I last saw this wound. He has small abrasions on the right dorsal second and third toes which look as though they are progressing towards closure 10/22; the patient came in 2 weeks ago with exposed bone. Sample showed osteomyelitis culture Enterococcus. Given his frailty and the state of his dementia I elected to try to treat him with oral Augmentin currently on 500 3 times daily. I am going to try to keep him on this for 6 weeks. He is eating and drinking well taking the antibiotics well. So far no overt side effects 11/12; unfortunately the patient was away from clinic longer than I thought he was going to be in they ran out of Augmentin about a week ago. I'm going to renew this but reduce the dose somewhat to twice a day. His wound looks better we've been using silver alginate. No exposed bone today. 12/7; the patient is finished Augmentin had roughly 28 days. For some reason pharmacy will not fill it 2-week supply of liquid Augmentin, in any case I am finished giving him antibiotics. This was for the bone specimen that showed Enterococcus he has been on this for at least 6  weeks. Some improvement in wound area. There is still some exposed bone superiorly in the swollen 08/12/2020; 1 month follow-up. We treated him for underlying osteomyelitis. He is using silver collagen backing wet-to-dry. Advanced dementia precludes use of a wound VAC. I did talk with his daughter today about advanced treatment products but she is having to change the dressing too frequently during the week to make this possible. He is not a candidate for plastic surgery therefore I really think that the collagen backing wet-to- dry is the best option here. He has 1.1 cm in direct depth and 1.9 cm and undermining from 11-6 o'clock. 2/11; 1 month follow-up. The wound looks essentially the same. This does not probe to bone the base of it seems to have healthy granulation but in terms of the overall wound volume undermining I think this is all about the same. I went over the options last visit with his daughter which would include an advanced treatment product necessitating more frequent visits. Because of his dementia and agitation I do not think he is a candidate for wound VAC or  plastic surgery 3/17; 1 month follow-up. The wound looks generally the same. Lower sacrum healthy granulation no exposed bone minimal amount of undermining. He is family reminds me that we did try wound VAC back in the early part of the fall last year however they are wondering whether we could try it again. Looking back over our records this was interrupted by exposed bone and probable osteomyelitis. I treated him with at least 4 weeks of oral Augmentin. I did not think we can get him through an MRI. Electronic Signature(s) Signed: 10/16/2020 5:46:09 PM By: Linton Ham MD Entered By: Linton Ham on 10/16/2020 16:24:32 -------------------------------------------------------------------------------- Physical Exam Details Patient Name: Date of Service: SLOAN, TAKAGI 10/16/2020 2:45 PM Medical Record Number:  [id number] Patient Account Number: [id number] Date of Birth/Sex: Treating RN: 1921/11/07 (87 y.o. [name] Primary Care Provider: PA Haig Prophet, NO Other Clinician: Referring Provider: Treating Provider/Extender: Frederick Peers in Treatment: 52 Constitutional Sitting or standing Blood Pressure is within target range for patient.. Pulse regular and within target range for patient.[name] Respirations regular, non-labored and within target range.. Temperature is normal and within the target range for the patient.[name] Appears in no distress. Psychiatric Seems to be at baseline. Notes Wound exam; sacral wound tissue looks healthy minimal undermining. There is still considerable relative depth however. Tissue right against bone but with no exposed bone. No evidence of surrounding soft tissue infection. Electronic Signature(s) Signed: 10/16/2020 5:46:09 PM By: Linton Ham MD Entered By: Linton Ham on 10/16/2020 16:25:54 -------------------------------------------------------------------------------- Physician Orders Details Patient Name: Date of Service: [address] DIMITRIS, SHANAHAN 10/16/2020 2:45 PM Medical Record Number: [id number] Patient Account Number: [id number] Date of Birth/Sex: Treating RN: 10-31-1921 (87 y.o. [name] Primary Care Provider: PA Haig Prophet, NO Other Clinician: Referring Provider: Treating Provider/Extender: Frederick Peers in Treatment: 52 Verbal / Phone Orders: No Diagnosis Coding ICD-10 Coding Code Description L89.154 Pressure ulcer of sacral region, stage 4 M86.18 Other acute osteomyelitis, other site Follow-up Appointments Return appointment in 1 month. Bathing/ Shower/ Hygiene May shower and wash wound with soap and water. - on days dressing changed Negative Presssure Wound Therapy Wound Vac to wound continuously at 148mm/hg pressure - continue same dressing until wound vac arrives. home health to apply three times a week  with prisma under vac. Black Foam Edema Control - Lymphedema / SCD / Other Elevate legs to the level of the heart or above for 30 minutes daily and/or when sitting, a frequency of: Avoid standing for long periods of time. Exercise regularly Off-Loading Low air-loss mattress (Group 2) Turn and reposition every 2 hours Other: - place pillows under calf to float heels [facility] wound care orders this week; continue Home Health for wound care. May utilize formulary equivalent dressing for wound treatment orders unless otherwise specified. - continue same dressing until wound vac arrives. home health to apply three times a week with prisma under vac. wound center to apply prisma and wet to dry dressing in clinic. home health will reapply vac after wound care appts or the following day. Other Home Health Orders/Instructions: - [facility] Wound #1 - Sacrum Cleanser: Normal Saline (Generic) Other:3 times per week and when soiled/30 Days Discharge Instructions: Cleanse the wound with Normal Saline prior to applying a clean dressing using gauze sponges, not tissue or cotton balls. Prim Dressing: Promogran Prisma Matrix, 4.34 (sq in) (silver collagen) (Generic) Other:3 times per week and when soiled/30 Days ary Discharge Instructions: moisten with normal  saline Secondary Dressing: Woven Gauze Sponge, Non-Sterile 4x4 in (Generic) Other:3 times per week and when soiled/30 Days Discharge Instructions: moisten with normal saline apply over PRisma Secondary Dressing: ComfortFoam Border, 3x3 in (silicone border) (Generic) Other:3 times per week and when soiled/30 Days Discharge Instructions: Apply over primary dressing as directed. Electronic Signature(s) Signed: 10/16/2020 5:46:09 PM By: Linton Ham MD Signed: 10/16/2020 5:53:43 PM By: Deon Pilling Entered By: Deon Pilling on 10/16/2020  15:37:36 -------------------------------------------------------------------------------- Problem List Details Patient Name: Date of Service: [facility] ANUAR, WALGREN 10/16/2020 2:45 PM Medical Record Number: [id number] Patient Account Number: [id number] Date of Birth/Sex: Treating RN: 09-05-1921 (87 y.o. [name] Primary Care Provider: PA Haig Prophet, NO Other Clinician: Referring Provider: Treating Provider/Extender: Frederick Peers in Treatment: 52 Active Problems ICD-10 Encounter Code Description Active Date MDM Diagnosis L89.154 Pressure ulcer of sacral region, stage 4 10/17/2019 No Yes M86.18 Other acute osteomyelitis, other site 05/23/2020 No Yes Inactive Problems ICD-10 Code Description Active Date Inactive Date E11.621 Type 2 diabetes mellitus with foot ulcer 10/17/2019 10/17/2019 L97.522 Non-pressure chronic ulcer of other part of left foot with fat layer exposed 10/17/2019 10/17/2019 F01.50 Vascular dementia without behavioral disturbance 10/17/2019 10/17/2019 I10 Essential (primary) hypertension 10/17/2019 10/17/2019 L97.518 Non-pressure chronic ulcer of other part of right foot with other specified severity 03/31/2020 03/31/2020 L03.031 Cellulitis of right toe 03/31/2020 03/31/2020 Resolved Problems Electronic Signature(s) Signed: 10/16/2020 5:46:09 PM By: Linton Ham MD Entered By: Linton Ham on 10/16/2020 16:21:22 -------------------------------------------------------------------------------- Progress Note Details Patient Name: Date of Service: [facility] 10/16/2020 2:45 PM Medical Record Number: [id number] Patient Account Number: [id number] Date of Birth/Sex: Treating RN: 02-19-1922 (87 y.o. [name] Primary Care Provider: PA Haig Prophet, NO Other Clinician: Referring Provider: Treating Provider/Extender: Frederick Peers in Treatment: 52 Subjective History of Present Illness (HPI) /A3/17/2021 upon evaluation today patient  presents for initial inspection here in our clinic concerning issues that has been having with a wound in the sacral region as well as the left heel. Fortunately neither appears to be overtly and significantly infected there is some necrotic tissue noted at both locations however that is going to be required to be addressed. He does have a history of diabetes mellitus type 2, vascular dementia, and hypertension. The patient did have a stroke in November 2020 unfortunately he has not been the same since that time according to his daughter. Prior to that he was taking care of himself now he is 100% dependent. He is at home with his daughter who is present during the office visit today as well that is where I get the majority of the history from at this point. Patient does have a history of hypertension as well which does not appear to be too out of control today which is good news. He does have a hemoglobin A1c of 6.05 May 2019. They currently have been using a calcium alginate dressing to the sacrum and it sounds like Skin-Prep and a foam on the heel. 4/9; pressure ulcers on the sacral area and left heel. Been using Anasept wet-to-dry on the sacrum and Hydrofera Blue in the heel. He has home health family is helping with the dressing. It sounds as though they are fairly religious about offloading these areas that have pressure relief surfaces for the bed. Wounds look better 4/30; I have not seen this patient in 3 weeks. We have been using Anasept wet-to-dry the sacrum and Hydrofera Blue on the heel. When I saw this 3 weeks  ago the sacrum actually look quite good and I thought that we might be able to transition him to a collagen-based dressing today. 5/10; the since the patient was last here he was hospitalized from 5/5 through 12/07/2019. He was noted to have a pressure injury of the sacral region stage IV. MRI did not suggest discitis or osteomyelitis. There was nonspecific bilateral paraspinal  muscle edema. Culture I did when he was here the last time showed E. coli I believe they are aware of this. Blood cultures were negative. His white count was 10.8 on admission 11.9 at discharge his comprehensive metabolic panel notable for an albumin of 2.4 BUN of 32 with creatinine of 0.03 hemoglobin was only 7.3 with a discharge hemoglobin of 8.1 after dropping to a hemoglobin of 6.6 He is back at home. Discharge antibiotic was Augmentin.[name] He was treated with IV Rocephin in the hospital. The patient was seen by palliative care I have not looked at this consult. They have been using dressings that they had left over at home I think most recently [facility]. According to his daughter the patient is eating fairly well 12/28/19-Patient is back at 2 weeks, the sacral wound is about the same, the right heel has healed and closed. We are using silver alginate to the sacral wound 6/18; we continue to follow the sacral wound. His heel wounds have closed. We have been using silver alginate to the sacrum 7/1; sacral wound. We have been using silver collagen with backing wet-to-dry. No open wounds on the heels. 7/15; some improvement in the dimensions of the sacral wound including the undermining area we have been using silver collagen with backing wet-to-dry dressings. We had some thoughts about trying a wound VAC. He has home health. 03/13/20-Sacral wound looks intact with healthy base, surrounding skin intact, no maceration, he did unfortunately develop to abrasion wounds on his first second and third toes dorsum of the Right foot this happened when his foot got trapped below the wheelchair Footguard 8/30; sacral wound has come down slightly especially the undermining. We have been using a wound VAC ooShe has 2 necrotic areas on the tip of the right first toe and the right second toe dorsally. 10/8 Is been about 5 weeks since I have seen this man. Family tells me that the sacrum really deteriorated over  the last week or 2. The blame home health for some back issues. Dimensions here are a lot larger than last time including the undermining and there is now exposed bone. He had an MRI done in May when he was hospitalized at this area that did not show osteomyelitis nevertheless there is been a fairly significant deterioration since I last saw this wound. He has small abrasions on the right dorsal second and third toes which look as though they are progressing towards closure 10/22; the patient came in 2 weeks ago with exposed bone. Sample showed osteomyelitis culture Enterococcus. Given his frailty and the state of his dementia I elected to try to treat him with oral Augmentin currently on 500 3 times daily. I am going to try to keep him on this for 6 weeks. He is eating and drinking well taking the antibiotics well. So far no overt side effects 11/12; unfortunately the patient was away from clinic longer than I thought he was going to be in they ran out of Augmentin about a week ago. I'm going to renew this but reduce the dose somewhat to twice a day. His wound looks  better we've been using silver alginate. No exposed bone today. 12/7; the patient is finished Augmentin had roughly 28 days. For some reason pharmacy will not fill it 2-week supply of liquid Augmentin, in any case I am finished giving him antibiotics. This was for the bone specimen that showed Enterococcus he has been on this for at least 6 weeks. Some improvement in wound area. There is still some exposed bone superiorly in the swollen 08/12/2020; 1 month follow-up. We treated him for underlying osteomyelitis. He is using silver collagen backing wet-to-dry. Advanced dementia precludes use of a wound VAC. I did talk with his daughter today about advanced treatment products but she is having to change the dressing too frequently during the week to make this possible. He is not a candidate for plastic surgery therefore I really think that the  collagen backing wet-to- dry is the best option here. He has 1.1 cm in direct depth and 1.9 cm and undermining from 11-6 o'clock. 2/11; 1 month follow-up. The wound looks essentially the same. This does not probe to bone the base of it seems to have healthy granulation but in terms of the overall wound volume undermining I think this is all about the same. I went over the options last visit with his daughter which would include an advanced treatment product necessitating more frequent visits. Because of his dementia and agitation I do not think he is a candidate for wound VAC or plastic surgery 3/17; 1 month follow-up. The wound looks generally the same. Lower sacrum healthy granulation no exposed bone minimal amount of undermining. He is family reminds me that we did try wound VAC back in the early part of the fall last year however they are wondering whether we could try it again. Looking back over our records this was interrupted by exposed bone and probable osteomyelitis. I treated him with at least 4 weeks of oral Augmentin. I did not think we can get him through an MRI. Objective Constitutional Sitting or standing Blood Pressure is within target range for patient.. Pulse regular and within target range for patient.[name] Respirations regular, non-labored and within target range.. Temperature is normal and within the target range for the patient.[name] Appears in no distress. Vitals Time Taken: 3:17 PM, Height: 71 in, Weight: 175 lbs, BMI: 24.4, Temperature: 98.3 F, Pulse: 93 bpm, Respiratory Rate: 16 breaths/min, Blood Pressure: 123/61 mmHg. Psychiatric Seems to be at baseline. General Notes: Wound exam; sacral wound tissue looks healthy minimal undermining. There is still considerable relative depth however. Tissue right against bone but with no exposed bone. No evidence of surrounding soft tissue infection. Integumentary (Hair, Skin) Wound #1 status is Open. Original cause of wound was Pressure  Injury. The date acquired was: 09/03/2019. The wound has been in treatment 52 weeks. The wound is located on the Sacrum. The wound measures 3.2cm length x 2.8cm width x 1.1cm depth; 7.037cm^2 area and 7.741cm^3 volume. There is bone and Fat Layer (Subcutaneous Tissue) exposed. There is no tunneling noted, however, there is undermining starting at 12:00 and ending at 5:00 with a maximum distance of 2.5cm. There is a medium amount of serosanguineous drainage noted. The wound margin is well defined and not attached to the wound base. There is large (67-100%) red, pink granulation within the wound bed. There is a small (1-33%) amount of necrotic tissue within the wound bed including Adherent Slough. Assessment Active Problems ICD-10 Pressure ulcer of sacral region, stage 4 Other acute osteomyelitis, other site Plan Follow-up Appointments:  Return appointment in 1 month. Bathing/ Shower/ Hygiene: May shower and wash wound with soap and water. - on days dressing changed Negative Presssure Wound Therapy: Wound Vac to wound continuously at 112mm/hg pressure - continue same dressing until wound vac arrives. home health to apply three times a week with prisma under vac. Black Foam Edema Control - Lymphedema / SCD / Other: Elevate legs to the level of the heart or above for 30 minutes daily and/or when sitting, a frequency of: Avoid standing for long periods of time. Exercise regularly Off-Loading: Low air-loss mattress (Group 2) Turn and reposition every 2 hours Other: - place pillows under calf to float heels Home Health: New wound care orders this week; continue Home Health for wound care. May utilize formulary equivalent dressing for wound treatment orders unless otherwise specified. - continue same dressing until wound vac arrives. home health to apply three times a week with prisma under vac. wound center to apply prisma and wet to dry dressing in clinic. home health will reapply vac after  wound care appts or the following day. Other Home Health Orders/Instructions: - [facility] WOUND #1: - Sacrum Wound Laterality: Cleanser: Normal Saline (Generic) Other:3 times per week and when soiled/30 Days Discharge Instructions: Cleanse the wound with Normal Saline prior to applying a clean dressing using gauze sponges, not tissue or cotton balls. Prim Dressing: Promogran Prisma Matrix, 4.34 (sq in) (silver collagen) (Generic) Other:3 times per week and when soiled/30 Days ary Discharge Instructions: moisten with normal saline Secondary Dressing: Woven Gauze Sponge, Non-Sterile 4x4 in (Generic) Other:3 times per week and when soiled/30 Days Discharge Instructions: moisten with normal saline apply over PRisma Secondary Dressing: ComfortFoam Border, 3x3 in (silicone border) (Generic) Other:3 times per week and when soiled/30 Days Discharge Instructions: Apply over primary dressing as directed. 1. I am continuing with the Prisma wet-to-dry backing 2. After reviewing things I think it may be reasonable to go ahead and retry the wound VAC. His family states that despite of his dementia and agitation he really tolerated this quite well and it seems like it was only the underlying infection that interrupted this treatment. Nevertheless at the time the improvements in the wound were not that great. 3. I told him I am willing to give this a 6-week trial Electronic Signature(s) Signed: 10/16/2020 5:46:09 PM By: Linton Ham MD Entered By: Linton Ham on 10/16/2020 16:26:58 -------------------------------------------------------------------------------- SuperBill Details Patient Name: Date of Service: [facility] DARYL, BEEHLER 10/16/2020 Medical Record Number: [id number] Patient Account Number: [id number] Date of Birth/Sex: Treating RN: Feb 08, 1922 (87 y.o. [name] Primary Care Provider: PA Haig Prophet, NO Other Clinician: Referring Provider: Treating Provider/Extender: Frederick Peers in Treatment: 52 Diagnosis Coding ICD-10 Codes Code Description L89.154 Pressure ulcer of sacral region, stage 4 M86.18 Other acute osteomyelitis, other site Facility Procedures CPT4 Code: 32992426 Description: 99214 - WOUND CARE VISIT-LEV 4 EST PT Modifier: Quantity: 1 Physician Procedures : CPT4 Code Description Modifier [id number] - WC PHYS LEVEL 3 - EST PT ICD-10 Diagnosis Description L89.154 Pressure ulcer of sacral region, stage 4 M86.18 Other acute osteomyelitis, other site Quantity: 1 Electronic Signature(s) Signed: 10/16/2020 5:46:09 PM By: Linton Ham MD Signed: 10/16/2020 5:53:43 PM By: Deon Pilling Entered By: Deon Pilling on 10/16/2020 16:46:07

## 2020-10-17 DIAGNOSIS — I130 Hypertensive heart and chronic kidney disease with heart failure and stage 1 through stage 4 chronic kidney disease, or unspecified chronic kidney disease: Secondary | ICD-10-CM | POA: Diagnosis not present

## 2020-10-17 DIAGNOSIS — F0391 Unspecified dementia with behavioral disturbance: Secondary | ICD-10-CM | POA: Diagnosis not present

## 2020-10-17 DIAGNOSIS — N183 Chronic kidney disease, stage 3 unspecified: Secondary | ICD-10-CM | POA: Diagnosis not present

## 2020-10-17 DIAGNOSIS — I5032 Chronic diastolic (congestive) heart failure: Secondary | ICD-10-CM | POA: Diagnosis not present

## 2020-10-17 DIAGNOSIS — I2699 Other pulmonary embolism without acute cor pulmonale: Secondary | ICD-10-CM | POA: Diagnosis not present

## 2020-10-17 DIAGNOSIS — I69351 Hemiplegia and hemiparesis following cerebral infarction affecting right dominant side: Secondary | ICD-10-CM | POA: Diagnosis not present

## 2020-10-17 DIAGNOSIS — L89154 Pressure ulcer of sacral region, stage 4: Secondary | ICD-10-CM | POA: Diagnosis not present

## 2020-10-17 DIAGNOSIS — Z7901 Long term (current) use of anticoagulants: Secondary | ICD-10-CM | POA: Diagnosis not present

## 2020-10-17 DIAGNOSIS — E1122 Type 2 diabetes mellitus with diabetic chronic kidney disease: Secondary | ICD-10-CM | POA: Diagnosis not present

## 2020-10-20 DIAGNOSIS — I69351 Hemiplegia and hemiparesis following cerebral infarction affecting right dominant side: Secondary | ICD-10-CM | POA: Diagnosis not present

## 2020-10-20 DIAGNOSIS — I5032 Chronic diastolic (congestive) heart failure: Secondary | ICD-10-CM | POA: Diagnosis not present

## 2020-10-20 DIAGNOSIS — Z7901 Long term (current) use of anticoagulants: Secondary | ICD-10-CM | POA: Diagnosis not present

## 2020-10-20 DIAGNOSIS — L89154 Pressure ulcer of sacral region, stage 4: Secondary | ICD-10-CM | POA: Diagnosis not present

## 2020-10-20 DIAGNOSIS — F0391 Unspecified dementia with behavioral disturbance: Secondary | ICD-10-CM | POA: Diagnosis not present

## 2020-10-20 DIAGNOSIS — I2699 Other pulmonary embolism without acute cor pulmonale: Secondary | ICD-10-CM | POA: Diagnosis not present

## 2020-10-20 DIAGNOSIS — N183 Chronic kidney disease, stage 3 unspecified: Secondary | ICD-10-CM | POA: Diagnosis not present

## 2020-10-20 DIAGNOSIS — I130 Hypertensive heart and chronic kidney disease with heart failure and stage 1 through stage 4 chronic kidney disease, or unspecified chronic kidney disease: Secondary | ICD-10-CM | POA: Diagnosis not present

## 2020-10-20 DIAGNOSIS — E1122 Type 2 diabetes mellitus with diabetic chronic kidney disease: Secondary | ICD-10-CM | POA: Diagnosis not present

## 2020-10-22 DIAGNOSIS — I130 Hypertensive heart and chronic kidney disease with heart failure and stage 1 through stage 4 chronic kidney disease, or unspecified chronic kidney disease: Secondary | ICD-10-CM | POA: Diagnosis not present

## 2020-10-22 DIAGNOSIS — L89154 Pressure ulcer of sacral region, stage 4: Secondary | ICD-10-CM | POA: Diagnosis not present

## 2020-10-22 DIAGNOSIS — E1122 Type 2 diabetes mellitus with diabetic chronic kidney disease: Secondary | ICD-10-CM | POA: Diagnosis not present

## 2020-10-22 DIAGNOSIS — L89302 Pressure ulcer of unspecified buttock, stage 2: Secondary | ICD-10-CM | POA: Diagnosis not present

## 2020-10-22 DIAGNOSIS — I2699 Other pulmonary embolism without acute cor pulmonale: Secondary | ICD-10-CM | POA: Diagnosis not present

## 2020-10-22 DIAGNOSIS — I69351 Hemiplegia and hemiparesis following cerebral infarction affecting right dominant side: Secondary | ICD-10-CM | POA: Diagnosis not present

## 2020-10-22 DIAGNOSIS — F0391 Unspecified dementia with behavioral disturbance: Secondary | ICD-10-CM | POA: Diagnosis not present

## 2020-10-22 DIAGNOSIS — I5032 Chronic diastolic (congestive) heart failure: Secondary | ICD-10-CM | POA: Diagnosis not present

## 2020-10-22 DIAGNOSIS — N183 Chronic kidney disease, stage 3 unspecified: Secondary | ICD-10-CM | POA: Diagnosis not present

## 2020-10-22 DIAGNOSIS — Z7901 Long term (current) use of anticoagulants: Secondary | ICD-10-CM | POA: Diagnosis not present

## 2020-10-24 DIAGNOSIS — I130 Hypertensive heart and chronic kidney disease with heart failure and stage 1 through stage 4 chronic kidney disease, or unspecified chronic kidney disease: Secondary | ICD-10-CM | POA: Diagnosis not present

## 2020-10-24 DIAGNOSIS — F0391 Unspecified dementia with behavioral disturbance: Secondary | ICD-10-CM | POA: Diagnosis not present

## 2020-10-24 DIAGNOSIS — I69351 Hemiplegia and hemiparesis following cerebral infarction affecting right dominant side: Secondary | ICD-10-CM | POA: Diagnosis not present

## 2020-10-24 DIAGNOSIS — N183 Chronic kidney disease, stage 3 unspecified: Secondary | ICD-10-CM | POA: Diagnosis not present

## 2020-10-24 DIAGNOSIS — I2699 Other pulmonary embolism without acute cor pulmonale: Secondary | ICD-10-CM | POA: Diagnosis not present

## 2020-10-24 DIAGNOSIS — I5032 Chronic diastolic (congestive) heart failure: Secondary | ICD-10-CM | POA: Diagnosis not present

## 2020-10-24 DIAGNOSIS — Z7901 Long term (current) use of anticoagulants: Secondary | ICD-10-CM | POA: Diagnosis not present

## 2020-10-24 DIAGNOSIS — L89154 Pressure ulcer of sacral region, stage 4: Secondary | ICD-10-CM | POA: Diagnosis not present

## 2020-10-24 DIAGNOSIS — E1122 Type 2 diabetes mellitus with diabetic chronic kidney disease: Secondary | ICD-10-CM | POA: Diagnosis not present

## 2020-10-27 DIAGNOSIS — I130 Hypertensive heart and chronic kidney disease with heart failure and stage 1 through stage 4 chronic kidney disease, or unspecified chronic kidney disease: Secondary | ICD-10-CM | POA: Diagnosis not present

## 2020-10-27 DIAGNOSIS — F0391 Unspecified dementia with behavioral disturbance: Secondary | ICD-10-CM | POA: Diagnosis not present

## 2020-10-27 DIAGNOSIS — L89154 Pressure ulcer of sacral region, stage 4: Secondary | ICD-10-CM | POA: Diagnosis not present

## 2020-10-27 DIAGNOSIS — I69351 Hemiplegia and hemiparesis following cerebral infarction affecting right dominant side: Secondary | ICD-10-CM | POA: Diagnosis not present

## 2020-10-27 DIAGNOSIS — I5032 Chronic diastolic (congestive) heart failure: Secondary | ICD-10-CM | POA: Diagnosis not present

## 2020-10-27 DIAGNOSIS — I2699 Other pulmonary embolism without acute cor pulmonale: Secondary | ICD-10-CM | POA: Diagnosis not present

## 2020-10-27 DIAGNOSIS — E1122 Type 2 diabetes mellitus with diabetic chronic kidney disease: Secondary | ICD-10-CM | POA: Diagnosis not present

## 2020-10-27 DIAGNOSIS — N183 Chronic kidney disease, stage 3 unspecified: Secondary | ICD-10-CM | POA: Diagnosis not present

## 2020-10-27 DIAGNOSIS — Z7901 Long term (current) use of anticoagulants: Secondary | ICD-10-CM | POA: Diagnosis not present

## 2020-10-28 DIAGNOSIS — L89154 Pressure ulcer of sacral region, stage 4: Secondary | ICD-10-CM | POA: Diagnosis not present

## 2020-10-29 DIAGNOSIS — F0391 Unspecified dementia with behavioral disturbance: Secondary | ICD-10-CM | POA: Diagnosis not present

## 2020-10-29 DIAGNOSIS — N183 Chronic kidney disease, stage 3 unspecified: Secondary | ICD-10-CM | POA: Diagnosis not present

## 2020-10-29 DIAGNOSIS — I2699 Other pulmonary embolism without acute cor pulmonale: Secondary | ICD-10-CM | POA: Diagnosis not present

## 2020-10-29 DIAGNOSIS — I5032 Chronic diastolic (congestive) heart failure: Secondary | ICD-10-CM | POA: Diagnosis not present

## 2020-10-29 DIAGNOSIS — I69351 Hemiplegia and hemiparesis following cerebral infarction affecting right dominant side: Secondary | ICD-10-CM | POA: Diagnosis not present

## 2020-10-29 DIAGNOSIS — I130 Hypertensive heart and chronic kidney disease with heart failure and stage 1 through stage 4 chronic kidney disease, or unspecified chronic kidney disease: Secondary | ICD-10-CM | POA: Diagnosis not present

## 2020-10-29 DIAGNOSIS — Z7901 Long term (current) use of anticoagulants: Secondary | ICD-10-CM | POA: Diagnosis not present

## 2020-10-29 DIAGNOSIS — E1122 Type 2 diabetes mellitus with diabetic chronic kidney disease: Secondary | ICD-10-CM | POA: Diagnosis not present

## 2020-10-29 DIAGNOSIS — L89154 Pressure ulcer of sacral region, stage 4: Secondary | ICD-10-CM | POA: Diagnosis not present

## 2020-10-30 DIAGNOSIS — L89302 Pressure ulcer of unspecified buttock, stage 2: Secondary | ICD-10-CM | POA: Diagnosis not present

## 2020-10-31 DIAGNOSIS — F0391 Unspecified dementia with behavioral disturbance: Secondary | ICD-10-CM | POA: Diagnosis not present

## 2020-10-31 DIAGNOSIS — Z7901 Long term (current) use of anticoagulants: Secondary | ICD-10-CM | POA: Diagnosis not present

## 2020-10-31 DIAGNOSIS — I5032 Chronic diastolic (congestive) heart failure: Secondary | ICD-10-CM | POA: Diagnosis not present

## 2020-10-31 DIAGNOSIS — I69351 Hemiplegia and hemiparesis following cerebral infarction affecting right dominant side: Secondary | ICD-10-CM | POA: Diagnosis not present

## 2020-10-31 DIAGNOSIS — I2699 Other pulmonary embolism without acute cor pulmonale: Secondary | ICD-10-CM | POA: Diagnosis not present

## 2020-10-31 DIAGNOSIS — N183 Chronic kidney disease, stage 3 unspecified: Secondary | ICD-10-CM | POA: Diagnosis not present

## 2020-10-31 DIAGNOSIS — I130 Hypertensive heart and chronic kidney disease with heart failure and stage 1 through stage 4 chronic kidney disease, or unspecified chronic kidney disease: Secondary | ICD-10-CM | POA: Diagnosis not present

## 2020-10-31 DIAGNOSIS — L89154 Pressure ulcer of sacral region, stage 4: Secondary | ICD-10-CM | POA: Diagnosis not present

## 2020-10-31 DIAGNOSIS — E1122 Type 2 diabetes mellitus with diabetic chronic kidney disease: Secondary | ICD-10-CM | POA: Diagnosis not present

## 2020-11-03 DIAGNOSIS — I130 Hypertensive heart and chronic kidney disease with heart failure and stage 1 through stage 4 chronic kidney disease, or unspecified chronic kidney disease: Secondary | ICD-10-CM | POA: Diagnosis not present

## 2020-11-03 DIAGNOSIS — I5032 Chronic diastolic (congestive) heart failure: Secondary | ICD-10-CM | POA: Diagnosis not present

## 2020-11-03 DIAGNOSIS — I69351 Hemiplegia and hemiparesis following cerebral infarction affecting right dominant side: Secondary | ICD-10-CM | POA: Diagnosis not present

## 2020-11-03 DIAGNOSIS — Z7901 Long term (current) use of anticoagulants: Secondary | ICD-10-CM | POA: Diagnosis not present

## 2020-11-03 DIAGNOSIS — N183 Chronic kidney disease, stage 3 unspecified: Secondary | ICD-10-CM | POA: Diagnosis not present

## 2020-11-03 DIAGNOSIS — I2699 Other pulmonary embolism without acute cor pulmonale: Secondary | ICD-10-CM | POA: Diagnosis not present

## 2020-11-03 DIAGNOSIS — L89154 Pressure ulcer of sacral region, stage 4: Secondary | ICD-10-CM | POA: Diagnosis not present

## 2020-11-03 DIAGNOSIS — F0391 Unspecified dementia with behavioral disturbance: Secondary | ICD-10-CM | POA: Diagnosis not present

## 2020-11-03 DIAGNOSIS — E1122 Type 2 diabetes mellitus with diabetic chronic kidney disease: Secondary | ICD-10-CM | POA: Diagnosis not present

## 2020-11-05 DIAGNOSIS — N183 Chronic kidney disease, stage 3 unspecified: Secondary | ICD-10-CM | POA: Diagnosis not present

## 2020-11-05 DIAGNOSIS — F0391 Unspecified dementia with behavioral disturbance: Secondary | ICD-10-CM | POA: Diagnosis not present

## 2020-11-05 DIAGNOSIS — E1122 Type 2 diabetes mellitus with diabetic chronic kidney disease: Secondary | ICD-10-CM | POA: Diagnosis not present

## 2020-11-05 DIAGNOSIS — I5032 Chronic diastolic (congestive) heart failure: Secondary | ICD-10-CM | POA: Diagnosis not present

## 2020-11-05 DIAGNOSIS — L89154 Pressure ulcer of sacral region, stage 4: Secondary | ICD-10-CM | POA: Diagnosis not present

## 2020-11-05 DIAGNOSIS — Z7901 Long term (current) use of anticoagulants: Secondary | ICD-10-CM | POA: Diagnosis not present

## 2020-11-05 DIAGNOSIS — I130 Hypertensive heart and chronic kidney disease with heart failure and stage 1 through stage 4 chronic kidney disease, or unspecified chronic kidney disease: Secondary | ICD-10-CM | POA: Diagnosis not present

## 2020-11-05 DIAGNOSIS — I69351 Hemiplegia and hemiparesis following cerebral infarction affecting right dominant side: Secondary | ICD-10-CM | POA: Diagnosis not present

## 2020-11-05 DIAGNOSIS — I2699 Other pulmonary embolism without acute cor pulmonale: Secondary | ICD-10-CM | POA: Diagnosis not present

## 2020-11-07 DIAGNOSIS — I5032 Chronic diastolic (congestive) heart failure: Secondary | ICD-10-CM | POA: Diagnosis not present

## 2020-11-07 DIAGNOSIS — L89154 Pressure ulcer of sacral region, stage 4: Secondary | ICD-10-CM | POA: Diagnosis not present

## 2020-11-07 DIAGNOSIS — N183 Chronic kidney disease, stage 3 unspecified: Secondary | ICD-10-CM | POA: Diagnosis not present

## 2020-11-07 DIAGNOSIS — I69351 Hemiplegia and hemiparesis following cerebral infarction affecting right dominant side: Secondary | ICD-10-CM | POA: Diagnosis not present

## 2020-11-07 DIAGNOSIS — I2699 Other pulmonary embolism without acute cor pulmonale: Secondary | ICD-10-CM | POA: Diagnosis not present

## 2020-11-07 DIAGNOSIS — E1122 Type 2 diabetes mellitus with diabetic chronic kidney disease: Secondary | ICD-10-CM | POA: Diagnosis not present

## 2020-11-07 DIAGNOSIS — F0391 Unspecified dementia with behavioral disturbance: Secondary | ICD-10-CM | POA: Diagnosis not present

## 2020-11-07 DIAGNOSIS — Z7901 Long term (current) use of anticoagulants: Secondary | ICD-10-CM | POA: Diagnosis not present

## 2020-11-07 DIAGNOSIS — I130 Hypertensive heart and chronic kidney disease with heart failure and stage 1 through stage 4 chronic kidney disease, or unspecified chronic kidney disease: Secondary | ICD-10-CM | POA: Diagnosis not present

## 2020-11-10 DIAGNOSIS — E1122 Type 2 diabetes mellitus with diabetic chronic kidney disease: Secondary | ICD-10-CM | POA: Diagnosis not present

## 2020-11-10 DIAGNOSIS — N183 Chronic kidney disease, stage 3 unspecified: Secondary | ICD-10-CM | POA: Diagnosis not present

## 2020-11-10 DIAGNOSIS — L89154 Pressure ulcer of sacral region, stage 4: Secondary | ICD-10-CM | POA: Diagnosis not present

## 2020-11-10 DIAGNOSIS — F0391 Unspecified dementia with behavioral disturbance: Secondary | ICD-10-CM | POA: Diagnosis not present

## 2020-11-10 DIAGNOSIS — I69351 Hemiplegia and hemiparesis following cerebral infarction affecting right dominant side: Secondary | ICD-10-CM | POA: Diagnosis not present

## 2020-11-10 DIAGNOSIS — I5032 Chronic diastolic (congestive) heart failure: Secondary | ICD-10-CM | POA: Diagnosis not present

## 2020-11-10 DIAGNOSIS — I2699 Other pulmonary embolism without acute cor pulmonale: Secondary | ICD-10-CM | POA: Diagnosis not present

## 2020-11-10 DIAGNOSIS — Z7901 Long term (current) use of anticoagulants: Secondary | ICD-10-CM | POA: Diagnosis not present

## 2020-11-10 DIAGNOSIS — I130 Hypertensive heart and chronic kidney disease with heart failure and stage 1 through stage 4 chronic kidney disease, or unspecified chronic kidney disease: Secondary | ICD-10-CM | POA: Diagnosis not present

## 2020-11-12 DIAGNOSIS — I130 Hypertensive heart and chronic kidney disease with heart failure and stage 1 through stage 4 chronic kidney disease, or unspecified chronic kidney disease: Secondary | ICD-10-CM | POA: Diagnosis not present

## 2020-11-12 DIAGNOSIS — I69351 Hemiplegia and hemiparesis following cerebral infarction affecting right dominant side: Secondary | ICD-10-CM | POA: Diagnosis not present

## 2020-11-12 DIAGNOSIS — Z7901 Long term (current) use of anticoagulants: Secondary | ICD-10-CM | POA: Diagnosis not present

## 2020-11-12 DIAGNOSIS — E1122 Type 2 diabetes mellitus with diabetic chronic kidney disease: Secondary | ICD-10-CM | POA: Diagnosis not present

## 2020-11-12 DIAGNOSIS — N183 Chronic kidney disease, stage 3 unspecified: Secondary | ICD-10-CM | POA: Diagnosis not present

## 2020-11-12 DIAGNOSIS — I2699 Other pulmonary embolism without acute cor pulmonale: Secondary | ICD-10-CM | POA: Diagnosis not present

## 2020-11-12 DIAGNOSIS — F0391 Unspecified dementia with behavioral disturbance: Secondary | ICD-10-CM | POA: Diagnosis not present

## 2020-11-12 DIAGNOSIS — I5032 Chronic diastolic (congestive) heart failure: Secondary | ICD-10-CM | POA: Diagnosis not present

## 2020-11-12 DIAGNOSIS — L89154 Pressure ulcer of sacral region, stage 4: Secondary | ICD-10-CM | POA: Diagnosis not present

## 2020-11-14 ENCOUNTER — Encounter (HOSPITAL_BASED_OUTPATIENT_CLINIC_OR_DEPARTMENT_OTHER): Payer: Medicare HMO | Admitting: Internal Medicine

## 2020-11-14 DIAGNOSIS — E1122 Type 2 diabetes mellitus with diabetic chronic kidney disease: Secondary | ICD-10-CM | POA: Diagnosis not present

## 2020-11-14 DIAGNOSIS — I69351 Hemiplegia and hemiparesis following cerebral infarction affecting right dominant side: Secondary | ICD-10-CM | POA: Diagnosis not present

## 2020-11-14 DIAGNOSIS — N183 Chronic kidney disease, stage 3 unspecified: Secondary | ICD-10-CM | POA: Diagnosis not present

## 2020-11-14 DIAGNOSIS — I2699 Other pulmonary embolism without acute cor pulmonale: Secondary | ICD-10-CM | POA: Diagnosis not present

## 2020-11-14 DIAGNOSIS — F0391 Unspecified dementia with behavioral disturbance: Secondary | ICD-10-CM | POA: Diagnosis not present

## 2020-11-14 DIAGNOSIS — L89154 Pressure ulcer of sacral region, stage 4: Secondary | ICD-10-CM | POA: Diagnosis not present

## 2020-11-14 DIAGNOSIS — Z7901 Long term (current) use of anticoagulants: Secondary | ICD-10-CM | POA: Diagnosis not present

## 2020-11-14 DIAGNOSIS — I5032 Chronic diastolic (congestive) heart failure: Secondary | ICD-10-CM | POA: Diagnosis not present

## 2020-11-14 DIAGNOSIS — I130 Hypertensive heart and chronic kidney disease with heart failure and stage 1 through stage 4 chronic kidney disease, or unspecified chronic kidney disease: Secondary | ICD-10-CM | POA: Diagnosis not present

## 2020-11-17 DIAGNOSIS — I2699 Other pulmonary embolism without acute cor pulmonale: Secondary | ICD-10-CM | POA: Diagnosis not present

## 2020-11-17 DIAGNOSIS — Z7901 Long term (current) use of anticoagulants: Secondary | ICD-10-CM | POA: Diagnosis not present

## 2020-11-17 DIAGNOSIS — N183 Chronic kidney disease, stage 3 unspecified: Secondary | ICD-10-CM | POA: Diagnosis not present

## 2020-11-17 DIAGNOSIS — I69351 Hemiplegia and hemiparesis following cerebral infarction affecting right dominant side: Secondary | ICD-10-CM | POA: Diagnosis not present

## 2020-11-17 DIAGNOSIS — L89154 Pressure ulcer of sacral region, stage 4: Secondary | ICD-10-CM | POA: Diagnosis not present

## 2020-11-17 DIAGNOSIS — F0391 Unspecified dementia with behavioral disturbance: Secondary | ICD-10-CM | POA: Diagnosis not present

## 2020-11-17 DIAGNOSIS — I5032 Chronic diastolic (congestive) heart failure: Secondary | ICD-10-CM | POA: Diagnosis not present

## 2020-11-17 DIAGNOSIS — E1122 Type 2 diabetes mellitus with diabetic chronic kidney disease: Secondary | ICD-10-CM | POA: Diagnosis not present

## 2020-11-17 DIAGNOSIS — I130 Hypertensive heart and chronic kidney disease with heart failure and stage 1 through stage 4 chronic kidney disease, or unspecified chronic kidney disease: Secondary | ICD-10-CM | POA: Diagnosis not present

## 2020-11-19 DIAGNOSIS — I69351 Hemiplegia and hemiparesis following cerebral infarction affecting right dominant side: Secondary | ICD-10-CM | POA: Diagnosis not present

## 2020-11-19 DIAGNOSIS — N183 Chronic kidney disease, stage 3 unspecified: Secondary | ICD-10-CM | POA: Diagnosis not present

## 2020-11-19 DIAGNOSIS — Z7901 Long term (current) use of anticoagulants: Secondary | ICD-10-CM | POA: Diagnosis not present

## 2020-11-19 DIAGNOSIS — L89302 Pressure ulcer of unspecified buttock, stage 2: Secondary | ICD-10-CM | POA: Diagnosis not present

## 2020-11-19 DIAGNOSIS — F0391 Unspecified dementia with behavioral disturbance: Secondary | ICD-10-CM | POA: Diagnosis not present

## 2020-11-19 DIAGNOSIS — I2699 Other pulmonary embolism without acute cor pulmonale: Secondary | ICD-10-CM | POA: Diagnosis not present

## 2020-11-19 DIAGNOSIS — E1122 Type 2 diabetes mellitus with diabetic chronic kidney disease: Secondary | ICD-10-CM | POA: Diagnosis not present

## 2020-11-19 DIAGNOSIS — L89154 Pressure ulcer of sacral region, stage 4: Secondary | ICD-10-CM | POA: Diagnosis not present

## 2020-11-19 DIAGNOSIS — I130 Hypertensive heart and chronic kidney disease with heart failure and stage 1 through stage 4 chronic kidney disease, or unspecified chronic kidney disease: Secondary | ICD-10-CM | POA: Diagnosis not present

## 2020-11-19 DIAGNOSIS — I5032 Chronic diastolic (congestive) heart failure: Secondary | ICD-10-CM | POA: Diagnosis not present

## 2020-11-22 DIAGNOSIS — L89302 Pressure ulcer of unspecified buttock, stage 2: Secondary | ICD-10-CM | POA: Diagnosis not present

## 2020-11-27 ENCOUNTER — Other Ambulatory Visit: Payer: Self-pay

## 2020-11-27 ENCOUNTER — Encounter (HOSPITAL_BASED_OUTPATIENT_CLINIC_OR_DEPARTMENT_OTHER): Payer: Medicare HMO | Attending: Internal Medicine | Admitting: Internal Medicine

## 2020-11-27 DIAGNOSIS — I10 Essential (primary) hypertension: Secondary | ICD-10-CM | POA: Diagnosis not present

## 2020-11-27 DIAGNOSIS — Z87891 Personal history of nicotine dependence: Secondary | ICD-10-CM | POA: Diagnosis not present

## 2020-11-27 DIAGNOSIS — E11621 Type 2 diabetes mellitus with foot ulcer: Secondary | ICD-10-CM | POA: Diagnosis not present

## 2020-11-27 DIAGNOSIS — E1151 Type 2 diabetes mellitus with diabetic peripheral angiopathy without gangrene: Secondary | ICD-10-CM | POA: Insufficient documentation

## 2020-11-27 DIAGNOSIS — F0150 Vascular dementia without behavioral disturbance: Secondary | ICD-10-CM | POA: Diagnosis not present

## 2020-11-27 DIAGNOSIS — L89154 Pressure ulcer of sacral region, stage 4: Secondary | ICD-10-CM | POA: Diagnosis not present

## 2020-11-27 DIAGNOSIS — J449 Chronic obstructive pulmonary disease, unspecified: Secondary | ICD-10-CM | POA: Diagnosis not present

## 2020-11-27 DIAGNOSIS — R6889 Other general symptoms and signs: Secondary | ICD-10-CM | POA: Diagnosis not present

## 2020-11-27 DIAGNOSIS — Z8673 Personal history of transient ischemic attack (TIA), and cerebral infarction without residual deficits: Secondary | ICD-10-CM | POA: Diagnosis not present

## 2020-11-27 NOTE — Progress Notes (Signed)
[name], [name] ([id number]) Visit Report for 11/27/2020 Chief Complaint Document Details Patient Name: Date of Service: [name], [name] 11/27/2020 3:15 PM Medical Record Number: [id number] Patient Account Number: [id number] Date of Birth/Sex: Treating RN: 08/23/1921 (87 y.o. [name] Primary Care Provider: PA Haig Prophet, NO Other Clinician: Referring Provider: Treating Provider/Extender: Colon Branch in Treatment: 58 Information Obtained from: Patient Chief Complaint Sacral and Left Heel Ulcer Electronic Signature(s) Signed: 11/27/2020 4:57:19 PM By: Kalman Shan DO Entered By: Kalman Shan on 11/27/2020 16:47:20 -------------------------------------------------------------------------------- HPI Details Patient Name: Date of Service: [name], [name] 11/27/2020 3:15 PM Medical Record Number: [id number] Patient Account Number: [id number] Date of Birth/Sex: Treating RN: [birth year] (87 y.o. [name] Primary Care Provider: PA Haig Prophet, NO Other Clinician: Referring Provider: Treating Provider/Extender: Colon Branch in Treatment: 58 History of Present Illness HPI Description: /A3/17/2021 upon evaluation today patient presents for initial inspection here in our clinic concerning issues that has been having with a wound in the sacral region as well as the left heel. Fortunately neither appears to be overtly and significantly infected there is some necrotic tissue noted at both locations however that is going to be required to be addressed. He does have a history of diabetes mellitus type 2, vascular dementia, and hypertension. The patient did have a stroke in November 2020 unfortunately he has not been the same since that time according to his daughter. Prior to that he was taking care of himself now he is 100% dependent. He is at home with his daughter who is present during the office visit today as well that is where I get the  majority of the history from at this point. Patient does have a history of hypertension as well which does not appear to be too out of control today which is good news. He does have a hemoglobin A1c of 6.05 May 2019. They currently have been using a calcium alginate dressing to the sacrum and it sounds like Skin-Prep and a foam on the heel. 4/9; pressure ulcers on the sacral area and left heel. Been using Anasept wet-to-dry on the sacrum and Hydrofera Blue in the heel. He has home health family is helping with the dressing. It sounds as though they are fairly religious about offloading these areas that have pressure relief surfaces for the bed. Wounds look better 4/30; I have not seen this patient in 3 weeks. We have been using Anasept wet-to-dry the sacrum and Hydrofera Blue on the heel. When I saw this 3 weeks ago the sacrum actually look quite good and I thought that we might be able to transition him to a collagen-based dressing today. 5/10; the since the patient was last here he was hospitalized from 5/5 through 12/07/2019. He was noted to have a pressure injury of the sacral region stage IV. MRI did not suggest discitis or osteomyelitis. There was nonspecific bilateral paraspinal muscle edema. Culture I did when he was here the last time showed E. coli I believe they are aware of this. Blood cultures were negative. His white count was 10.8 on admission 11.9 at discharge his comprehensive metabolic panel notable for an albumin of 2.4 BUN of 32 with creatinine of 0.03 hemoglobin was only 7.3 with a discharge hemoglobin of 8.1 after dropping to a hemoglobin of 6.6 He is back at home. Discharge antibiotic was Augmentin.[name] He was treated with IV Rocephin in the hospital. The patient was seen by palliative care I have not looked at  this consult. They have been using dressings that they had left over at home I think most recently [facility]. According to his daughter the patient is eating fairly  well 12/28/19-Patient is back at 2 weeks, the sacral wound is about the same, the right heel has healed and closed. We are using silver alginate to the sacral wound 6/18; we continue to follow the sacral wound. His heel wounds have closed. We have been using silver alginate to the sacrum 7/1; sacral wound. We have been using silver collagen with backing wet-to-dry. No open wounds on the heels. 7/15; some improvement in the dimensions of the sacral wound including the undermining area we have been using silver collagen with backing wet-to-dry dressings. We had some thoughts about trying a wound VAC. He has home health. 03/13/20-Sacral wound looks intact with healthy base, surrounding skin intact, no maceration, he did unfortunately develop to abrasion wounds on his first second and third toes dorsum of the Right foot this happened when his foot got trapped below the wheelchair Footguard 8/30; sacral wound has come down slightly especially the undermining. We have been using a wound VAC She has 2 necrotic areas on the tip of the right first toe and the right second toe dorsally. 10/8 Is been about 5 weeks since I have seen this man. Family tells me that the sacrum really deteriorated over the last week or 2. The blame home health for some back issues. Dimensions here are a lot larger than last time including the undermining and there is now exposed bone. He had an MRI done in May when he was hospitalized at this area that did not show osteomyelitis nevertheless there is been a fairly significant deterioration since I last saw this wound. He has small abrasions on the right dorsal second and third toes which look as though they are progressing towards closure 10/22; the patient came in 2 weeks ago with exposed bone. Sample showed osteomyelitis culture Enterococcus. Given his frailty and the state of his dementia I elected to try to treat him with oral Augmentin currently on 500 3 times daily. I am going to  try to keep him on this for 6 weeks. He is eating and drinking well taking the antibiotics well. So far no overt side effects 11/12; unfortunately the patient was away from clinic longer than I thought he was going to be in they ran out of Augmentin about a week ago. I'm going to renew this but reduce the dose somewhat to twice a day. His wound looks better we've been using silver alginate. No exposed bone today. 12/7; the patient is finished Augmentin had roughly 28 days. For some reason pharmacy will not fill it 2-week supply of liquid Augmentin, in any case I am finished giving him antibiotics. This was for the bone specimen that showed Enterococcus he has been on this for at least 6 weeks. Some improvement in wound area. There is still some exposed bone superiorly in the swollen 08/12/2020; 1 month follow-up. We treated him for underlying osteomyelitis. He is using silver collagen backing wet-to-dry. Advanced dementia precludes use of a wound VAC. I did talk with his daughter today about advanced treatment products but she is having to change the dressing too frequently during the week to make this possible. He is not a candidate for plastic surgery therefore I really think that the collagen backing wet-to- dry is the best option here. He has 1.1 cm in direct depth and 1.9 cm and undermining  from 11-6 o'clock. 2/11; 1 month follow-up. The wound looks essentially the same. This does not probe to bone the base of it seems to have healthy granulation but in terms of the overall wound volume undermining I think this is all about the same. I went over the options last visit with his daughter which would include an advanced treatment product necessitating more frequent visits. Because of his dementia and agitation I do not think he is a candidate for wound VAC or plastic surgery 3/17; 1 month follow-up. The wound looks generally the same. Lower sacrum healthy granulation no exposed bone minimal amount of  undermining. He is family reminds me that we did try wound VAC back in the early part of the fall last year however they are wondering whether we could try it again. Looking back over our records this was interrupted by exposed bone and probable osteomyelitis. I treated him with at least 4 weeks of oral Augmentin. I did not think we can get him through an MRI. 4/28; 1 month follow-up. Patient is currently using a wound VAC. Patient's daughter is present. There are no issues or complaints today. Electronic Signature(s) Signed: 11/27/2020 4:57:19 PM By: Kalman Shan DO Entered By: Kalman Shan on 11/27/2020 16:52:29 -------------------------------------------------------------------------------- Physical Exam Details Patient Name: Date of Service: [name], [name] 11/27/2020 3:15 PM Medical Record Number: [id number] Patient Account Number: [id number] Date of Birth/Sex: Treating RN: 1921/12/29 (87 y.o. [name] Primary Care Provider: PA Haig Prophet, NO Other Clinician: Referring Provider: Treating Provider/Extender: Colon Branch in Treatment: 58 Constitutional respirations regular, non-labored and within target range for patient.[name] Psychiatric pleasant and cooperative. Notes Sacral ulcer: Granulation tissue present inside the wound. No signs of infection. Electronic Signature(s) Signed: 11/27/2020 4:57:19 PM By: Kalman Shan DO Entered By: Kalman Shan on 11/27/2020 16:52:42 -------------------------------------------------------------------------------- Physician Orders Details Patient Name: Date of Service: [name], [name] 11/27/2020 3:15 PM Medical Record Number: [id number] Patient Account Number: [id number] Date of Birth/Sex: Treating RN: November 14, 1921 (87 y.o. [name] Primary Care Provider: PA Haig Prophet, NO Other Clinician: Referring Provider: Treating Provider/Extender: Colon Branch in Treatment: 82 Verbal / Phone  Orders: No Diagnosis Coding ICD-10 Coding Code Description L89.154 Pressure ulcer of sacral region, stage 4 M86.18 Other acute osteomyelitis, other site Follow-up Appointments Return appointment in 1 month. - Dr. Dellia Nims Bathing/ Shower/ Hygiene May shower and wash wound with soap and water. - on days dressing changed Negative Presssure Wound Therapy Wound Vac to wound continuously at 156mm/hg pressure - continue same dressing until wound vac arrives. home health to apply three times a week with prisma under vac. ***PLEASE ENSURE TO PACK BLACK FOAM INTO WOUND AND UNDERMINING.*** Black Foam - ***PLEASE ENSURE TO PACK BLACK FOAM INTO WOUND AND UNDERMINING.*** Edema Control - Lymphedema / SCD / Other Elevate legs to the level of the heart or above for 30 minutes daily and/or when sitting, a frequency of: Off-Loading Low air-loss mattress (Group 2) Turn and reposition every 2 hours Other: - place pillows under calf to float heels Home Health New wound care orders this week; continue Home Health for wound care. May utilize formulary equivalent dressing for wound treatment orders unless otherwise specified. - home health to apply three times a week with prisma under vac. wound center to apply prisma and wet to dry dressing in clinic. home health will reapply vac after wound care appts or the following day. Other Home Health Orders/Instructions: - Advance home health Wound Treatment Wound #  1 - Sacrum Cleanser: Normal Saline (Generic) Other:3 times per week and when soiled/30 Days Discharge Instructions: Cleanse the wound with Normal Saline prior to applying a clean dressing using gauze sponges, not tissue or cotton balls. Prim Dressing: Promogran Prisma Matrix, 4.34 (sq in) (silver collagen) (Generic) Other:3 times per week and when soiled/30 Days ary Discharge Instructions: moisten with normal saline under wound vac. Wound center to apply prisma. Secondary Dressing: Woven Gauze Sponge,  Non-Sterile 4x4 in (Generic) Other:3 times per week and when soiled/30 Days Discharge Instructions: saline moisten gauze over Prisma in clinic. Secondary Dressing: ComfortFoam Border, 3x3 in (silicone border) (Generic) Other:3 times per week and when soiled/30 Days Discharge Instructions: Apply over primary dressing as directed. Electronic Signature(s) Signed: 11/27/2020 4:57:19 PM By: Kalman Shan DO Entered By: Kalman Shan on 11/27/2020 16:52:54 -------------------------------------------------------------------------------- Problem List Details Patient Name: Date of Service: [address] [name], [name] 11/27/2020 3:15 PM Medical Record Number: [id number] Patient Account Number: [id number] Date of Birth/Sex: Treating RN: 1922-04-01 (87 y.o. [name] Primary Care Provider: PA Haig Prophet, NO Other Clinician: Referring Provider: Treating Provider/Extender: Colon Branch in Treatment: 87 Active Problems ICD-10 Encounter Code Description Active Date MDM Diagnosis L89.154 Pressure ulcer of sacral region, stage 4 10/17/2019 No Yes M86.18 Other acute osteomyelitis, other site 05/23/2020 No Yes Inactive Problems ICD-10 Code Description Active Date Inactive Date E11.621 Type 2 diabetes mellitus with foot ulcer 10/17/2019 10/17/2019 L97.522 Non-pressure chronic ulcer of other part of left foot with fat layer exposed 10/17/2019 10/17/2019 F01.50 Vascular dementia without behavioral disturbance 10/17/2019 10/17/2019 I10 Essential (primary) hypertension 10/17/2019 10/17/2019 L97.518 Non-pressure chronic ulcer of other part of right foot with other specified severity 03/31/2020 03/31/2020 L03.031 Cellulitis of right toe 03/31/2020 03/31/2020 Resolved Problems Electronic Signature(s) Signed: 11/27/2020 4:57:19 PM By: Kalman Shan DO Entered By: Kalman Shan on 11/27/2020 16:46:59 -------------------------------------------------------------------------------- Progress Note  Details Patient Name: Date of Service: [name], [name] 11/27/2020 3:15 PM Medical Record Number: [id number] Patient Account Number: [id number] Date of Birth/Sex: Treating RN: [birth year] (87 y.o. [name] Primary Care Provider: PA Haig Prophet, NO Other Clinician: Referring Provider: Treating Provider/Extender: Colon Branch in Treatment: 58 Subjective Chief Complaint Information obtained from Patient Sacral and Left Heel Ulcer History of Present Illness (HPI) /A3/17/2021 upon evaluation today patient presents for initial inspection here in our clinic concerning issues that has been having with a wound in the sacral region as well as the left heel. Fortunately neither appears to be overtly and significantly infected there is some necrotic tissue noted at both locations however that is going to be required to be addressed. He does have a history of diabetes mellitus type 2, vascular dementia, and hypertension. The patient did have a stroke in November 2020 unfortunately he has not been the same since that time according to his daughter. Prior to that he was taking care of himself now he is 100% dependent. He is at home with his daughter who is present during the office visit today as well that is where I get the majority of the history from at this point. Patient does have a history of hypertension as well which does not appear to be too out of control today which is good news. He does have a hemoglobin A1c of 6.05 May 2019. They currently have been using a calcium alginate dressing to the sacrum and it sounds like Skin-Prep and a foam on the heel. 4/9; pressure ulcers on the sacral area and left heel. Been using  Anasept wet-to-dry on the sacrum and Hydrofera Blue in the heel. He has home health family is helping with the dressing. It sounds as though they are fairly religious about offloading these areas that have pressure relief surfaces for the bed. Wounds look  better 4/30; I have not seen this patient in 3 weeks. We have been using Anasept wet-to-dry the sacrum and Hydrofera Blue on the heel. When I saw this 3 weeks ago the sacrum actually look quite good and I thought that we might be able to transition him to a collagen-based dressing today. 5/10; the since the patient was last here he was hospitalized from 5/5 through 12/07/2019. He was noted to have a pressure injury of the sacral region stage IV. MRI did not suggest discitis or osteomyelitis. There was nonspecific bilateral paraspinal muscle edema. Culture I did when he was here the last time showed E. coli I believe they are aware of this. Blood cultures were negative. His white count was 10.8 on admission 11.9 at discharge his comprehensive metabolic panel notable for an albumin of 2.4 BUN of 32 with creatinine of 0.03 hemoglobin was only 7.3 with a discharge hemoglobin of 8.1 after dropping to a hemoglobin of 6.6 He is back at home. Discharge antibiotic was Augmentin.[name] He was treated with IV Rocephin in the hospital. The patient was seen by palliative care I have not looked at this consult. They have been using dressings that they had left over at home I think most recently [facility]. According to his daughter the patient is eating fairly well 12/28/19-Patient is back at 2 weeks, the sacral wound is about the same, the right heel has healed and closed. We are using silver alginate to the sacral wound 6/18; we continue to follow the sacral wound. His heel wounds have closed. We have been using silver alginate to the sacrum 7/1; sacral wound. We have been using silver collagen with backing wet-to-dry. No open wounds on the heels. 7/15; some improvement in the dimensions of the sacral wound including the undermining area we have been using silver collagen with backing wet-to-dry dressings. We had some thoughts about trying a wound VAC. He has home health. 03/13/20-Sacral wound looks intact with  healthy base, surrounding skin intact, no maceration, he did unfortunately develop to abrasion wounds on his first second and third toes dorsum of the Right foot this happened when his foot got trapped below the wheelchair Footguard 8/30; sacral wound has come down slightly especially the undermining. We have been using a wound VAC ooShe has 2 necrotic areas on the tip of the right first toe and the right second toe dorsally. 10/8 Is been about 5 weeks since I have seen this man. Family tells me that the sacrum really deteriorated over the last week or 2. The blame home health for some back issues. Dimensions here are a lot larger than last time including the undermining and there is now exposed bone. He had an MRI done in May when he was hospitalized at this area that did not show osteomyelitis nevertheless there is been a fairly significant deterioration since I last saw this wound. He has small abrasions on the right dorsal second and third toes which look as though they are progressing towards closure 10/22; the patient came in 2 weeks ago with exposed bone. Sample showed osteomyelitis culture Enterococcus. Given his frailty and the state of his dementia I elected to try to treat him with oral Augmentin currently on 500 3 times  daily. I am going to try to keep him on this for 6 weeks. He is eating and drinking well taking the antibiotics well. So far no overt side effects 11/12; unfortunately the patient was away from clinic longer than I thought he was going to be in they ran out of Augmentin about a week ago. I'm going to renew this but reduce the dose somewhat to twice a day. His wound looks better we've been using silver alginate. No exposed bone today. 12/7; the patient is finished Augmentin had roughly 28 days. For some reason pharmacy will not fill it 2-week supply of liquid Augmentin, in any case I am finished giving him antibiotics. This was for the bone specimen that showed Enterococcus  he has been on this for at least 6 weeks. Some improvement in wound area. There is still some exposed bone superiorly in the swollen 08/12/2020; 1 month follow-up. We treated him for underlying osteomyelitis. He is using silver collagen backing wet-to-dry. Advanced dementia precludes use of a wound VAC. I did talk with his daughter today about advanced treatment products but she is having to change the dressing too frequently during the week to make this possible. He is not a candidate for plastic surgery therefore I really think that the collagen backing wet-to- dry is the best option here. He has 1.1 cm in direct depth and 1.9 cm and undermining from 11-6 o'clock. 2/11; 1 month follow-up. The wound looks essentially the same. This does not probe to bone the base of it seems to have healthy granulation but in terms of the overall wound volume undermining I think this is all about the same. I went over the options last visit with his daughter which would include an advanced treatment product necessitating more frequent visits. Because of his dementia and agitation I do not think he is a candidate for wound VAC or plastic surgery 3/17; 1 month follow-up. The wound looks generally the same. Lower sacrum healthy granulation no exposed bone minimal amount of undermining. He is family reminds me that we did try wound VAC back in the early part of the fall last year however they are wondering whether we could try it again. Looking back over our records this was interrupted by exposed bone and probable osteomyelitis. I treated him with at least 4 weeks of oral Augmentin. I did not think we can get him through an MRI. 4/28; 1 month follow-up. Patient is currently using a wound VAC. Patient's daughter is present. There are no issues or complaints today. Patient History Information obtained from Patient. Family History Unknown History. Social History Former smoker - quit in 1970s, Marital Status - Widowed,  Alcohol Use - Never, Drug Use - No History, Caffeine Use - Never. Medical History Eyes Patient has history of Glaucoma Respiratory Patient has history of Chronic Obstructive Pulmonary Disease (COPD) Cardiovascular Patient has history of Coronary Artery Disease, Hypertension Endocrine Patient has history of Type II Diabetes Musculoskeletal Patient has history of Osteoarthritis Neurologic Patient has history of Dementia Medical A Surgical History Notes nd Cardiovascular CVA Nov 2020, Hyperlipidemia Objective Constitutional respirations regular, non-labored and within target range for patient.. Vitals Time Taken: 3:24 PM, Height: 71 in, Weight: 175 lbs, BMI: 24.4, Temperature: 97.8 F, Pulse: 89 bpm, Respiratory Rate: 16 breaths/min, Blood Pressure: 100/59 mmHg. Psychiatric pleasant and cooperative. General Notes: Sacral ulcer: Granulation tissue present inside the wound. No signs of infection. Integumentary (Hair, Skin) Wound #1 status is Open. Original cause of wound was Pressure Injury.  The date acquired was: 09/03/2019. The wound has been in treatment 58 weeks. The wound is located on the Sacrum. The wound measures 2cm length x 2cm width x 1.2cm depth; 3.142cm^2 area and 3.77cm^3 volume. There is Fat Layer (Subcutaneous Tissue) exposed. There is no tunneling noted, however, there is undermining starting at 10:00 and ending at 5:00 with a maximum distance of 2.6cm. There is a medium amount of serosanguineous drainage noted. The wound margin is well defined and not attached to the wound base. There is large (67- 100%) pink granulation within the wound bed. There is a small (1-33%) amount of necrotic tissue within the wound bed including Adherent Slough. Assessment Active Problems ICD-10 Pressure ulcer of sacral region, stage 4 Other acute osteomyelitis, other site Overall the wound appears stable with no signs of infection. We will continue collagen with the wound VAC. Can  follow-up in 1 month. Plan Follow-up Appointments: Return appointment in 1 month. - Dr. Dellia Nims Bathing/ Shower/ Hygiene: May shower and wash wound with soap and water. - on days dressing changed Negative Presssure Wound Therapy: Wound Vac to wound continuously at 173mm/hg pressure - continue same dressing until wound vac arrives. home health to apply three times a week with prisma under vac. ***PLEASE ENSURE TO PACK BLACK FOAM INTO WOUND AND UNDERMINING.*** Black Foam - ***PLEASE ENSURE TO PACK BLACK FOAM INTO WOUND AND UNDERMINING.*** Edema Control - Lymphedema / SCD / Other: Elevate legs to the level of the heart or above for 30 minutes daily and/or when sitting, a frequency of: Off-Loading: Low air-loss mattress (Group 2) Turn and reposition every 2 hours Other: - place pillows under calf to float heels Home Health: New wound care orders this week; continue Home Health for wound care. May utilize formulary equivalent dressing for wound treatment orders unless otherwise specified. - home health to apply three times a week with prisma under vac. wound center to apply prisma and wet to dry dressing in clinic. home health will reapply vac after wound care appts or the following day. Other Home Health Orders/Instructions: - [facility] home health WOUND #1: - Sacrum Wound Laterality: Cleanser: Normal Saline (Generic) Other:3 times per week and when soiled/30 Days Discharge Instructions: Cleanse the wound with Normal Saline prior to applying a clean dressing using gauze sponges, not tissue or cotton balls. Prim Dressing: Promogran Prisma Matrix, 4.34 (sq in) (silver collagen) (Generic) Other:3 times per week and when soiled/30 Days ary Discharge Instructions: moisten with normal saline under wound vac. Wound center to apply prisma. Secondary Dressing: Woven Gauze Sponge, Non-Sterile 4x4 in (Generic) Other:3 times per week and when soiled/30 Days Discharge Instructions: saline moisten gauze over  Prisma in clinic. Secondary Dressing: ComfortFoam Border, 3x3 in (silicone border) (Generic) Other:3 times per week and when soiled/30 Days Discharge Instructions: Apply over primary dressing as directed. 1. Wound VAC with collagen 2. Follow-up in 1 month Electronic Signature(s) Signed: 11/27/2020 4:57:19 PM By: Kalman Shan DO Entered By: Kalman Shan on 11/27/2020 16:53:52 -------------------------------------------------------------------------------- HxROS Details Patient Name: Date of Service: [name], [name] 11/27/2020 3:15 PM Medical Record Number: [id number] Patient Account Number: [id number] Date of Birth/Sex: Treating RN: 1922/06/02 (87 y.o. [name] Primary Care Provider: PA Haig Prophet, [place] Other Clinician: Referring Provider: Treating Provider/Extender: Colon Branch in Treatment: 58 Information Obtained From Patient Eyes Medical History: Positive for: Glaucoma Respiratory Medical History: Positive for: Chronic Obstructive Pulmonary Disease (COPD) Cardiovascular Medical History: Positive for: Coronary Artery Disease; Hypertension Past Medical History Notes: CVA Nov  2020, Hyperlipidemia Endocrine Medical History: Positive for: Type II Diabetes Time with diabetes: over 30 years Treated with: Insulin Blood sugar tested every day: Yes Tested : 3 times a day Musculoskeletal Medical History: Positive for: Osteoarthritis Neurologic Medical History: Positive for: Dementia HBO Extended History Items Eyes: Glaucoma Immunizations Pneumococcal Vaccine: Received Pneumococcal Vaccination: Yes Implantable Devices None Family and Social History Unknown History: Yes; Former smoker - quit in 1970s; Marital Status - Widowed; Alcohol Use: Never; Drug Use: No History; Caffeine Use: Never; Financial Concerns: No; Food, Clothing or Shelter Needs: No; Support System Lacking: No; Transportation Concerns: No Electronic Signature(s) Signed:  11/27/2020 4:57:19 PM By: Kalman Shan DO Signed: 11/27/2020 6:31:57 PM By: Deon Pilling Entered By: Kalman Shan on 11/27/2020 16:52:35 -------------------------------------------------------------------------------- SuperBill Details Patient Name: Date of Service: [facility]CON, GRAP 11/27/2020 Medical Record Number: [id number] Patient Account Number: [id number] Date of Birth/Sex: Treating RN: 1921/08/16 (87 y.o. [name] Primary Care Provider: PA Haig Prophet, NO Other Clinician: Referring Provider: Treating Provider/Extender: Colon Branch in Treatment: 58 Diagnosis Coding ICD-10 Codes Code Description L89.154 Pressure ulcer of sacral region, stage 4 M86.18 Other acute osteomyelitis, other site Facility Procedures CPT4 Code: [id number] Description: [place] VISIT-LEV 4 EST PT Modifier: Quantity: 1 Electronic Signature(s) Signed: 11/27/2020 4:57:19 PM By: Kalman Shan DO Entered By: Kalman Shan on 11/27/2020 16:56:39

## 2020-12-01 NOTE — Progress Notes (Signed)
TERREL, NESHEIWAT ([id number]) Visit Report for 11/27/2020 Arrival Information Details Patient Name: Date of Service: [name], [name] 11/27/2020 3:15 PM Medical Record Number: [id number] Patient Account Number: [id number] Date of Birth/Sex: Treating RN: 01-30-1922 (87 y.o. [name] Primary Care Beyounce Dickens: PA TIENT, NO Other Clinician: Referring Lashawn Orrego: Treating Hafsah Hendler/Extender: Colon Branch in Treatment: 58 Visit Information History Since Last Visit Added or deleted any medications: No Patient Arrived: Wheel Chair Any new allergies or adverse reactions: No Arrival Time: 15:24 Had a fall or experienced change in No Accompanied By: daughter activities of daily living that may affect Transfer Assistance: Harrel Lemon Lift risk of falls: Patient Requires Transmission-Based Precautions: No Signs or symptoms of abuse/neglect since last visito No Patient Has Alerts: No Hospitalized since last visit: No Implantable device outside of the clinic excluding No cellular tissue based products placed in the center since last visit: Has Dressing in Place as Prescribed: Yes Pain Present Now: No Electronic Signature(s) Signed: 12/01/2020 5:39:21 PM By: Levan Hurst RN, BSN Entered By: Levan Hurst on 11/27/2020 15:24:50 -------------------------------------------------------------------------------- Clinic Level of Care Assessment Details Patient Name: Date of Service: [name], [name] 11/27/2020 3:15 PM Medical Record Number: [id number] Patient Account Number: [id number] Date of Birth/Sex: Treating RN: June 18, 1922 (87 y.o. [name] Primary Care Deaunte Dente: PA Haig Prophet, NO Other Clinician: Referring Tai Syfert: Treating Ellarae Nevitt/Extender: Colon Branch in Treatment: 58 Clinic Level of Care Assessment Items TOOL 4 Quantity Score X- 1 0 Use when only an EandM is performed on FOLLOW-UP visit ASSESSMENTS - Nursing Assessment / Reassessment X- 1  10 Reassessment of Co-morbidities (includes updates in patient status) X- 1 5 Reassessment of Adherence to Treatment Plan ASSESSMENTS - Wound and Skin A ssessment / Reassessment X - Simple Wound Assessment / Reassessment - one wound 1 5 []  - 0 Complex Wound Assessment / Reassessment - multiple wounds X- 1 10 Dermatologic / Skin Assessment (not related to wound area) ASSESSMENTS - Focused Assessment []  - 0 Circumferential Edema Measurements - multi extremities X- 1 10 Nutritional Assessment / Counseling / Intervention []  - 0 Lower Extremity Assessment (monofilament, tuning fork, pulses) []  - 0 Peripheral Arterial Disease Assessment (using hand held doppler) ASSESSMENTS - Ostomy and/or Continence Assessment and Care []  - 0 Incontinence Assessment and Management []  - 0 Ostomy Care Assessment and Management (repouching, etc.) PROCESS - Coordination of Care X - Simple Patient / Family Education for ongoing care 1 15 []  - 0 Complex (extensive) Patient / Family Education for ongoing care X- 1 10 Staff obtains Programmer, systems, Records, T Results / Process Orders est X- 1 10 Staff telephones HHA, Nursing Homes / Clarify orders / etc []  - 0 Routine Transfer to another Facility (non-emergent condition) []  - 0 Routine Hospital Admission (non-emergent condition) []  - 0 New Admissions / Biomedical engineer / Ordering NPWT Apligraf, etc. , []  - 0 Emergency Hospital Admission (emergent condition) []  - 0 Simple Discharge Coordination X- 1 15 Complex (extensive) Discharge Coordination PROCESS - Special Needs []  - 0 Pediatric / Minor Patient Management []  - 0 Isolation Patient Management []  - 0 Hearing / Language / Visual special needs []  - 0 Assessment of Community assistance (transportation, D/C planning, etc.) []  - 0 Additional assistance / Altered mentation []  - 0 Support Surface(s) Assessment (bed, cushion, seat, etc.) INTERVENTIONS - Wound Cleansing / Measurement X -  Simple Wound Cleansing - one wound 1 5 []  - 0 Complex Wound Cleansing - multiple wounds X- 1 5 Wound Imaging (photographs -  any number of wounds) []  - 0 Wound Tracing (instead of photographs) X- 1 5 Simple Wound Measurement - one wound []  - 0 Complex Wound Measurement - multiple wounds INTERVENTIONS - Wound Dressings X - Small Wound Dressing one or multiple wounds 1 10 []  - 0 Medium Wound Dressing one or multiple wounds []  - 0 Large Wound Dressing one or multiple wounds []  - 0 Application of Medications - topical []  - 0 Application of Medications - injection INTERVENTIONS - Miscellaneous []  - 0 External ear exam []  - 0 Specimen Collection (cultures, biopsies, blood, body fluids, etc.) []  - 0 Specimen(s) / Culture(s) sent or taken to Lab for analysis []  - 0 Patient Transfer (multiple staff / Civil Service fast streamer / Similar devices) []  - 0 Simple Staple / Suture removal (25 or less) []  - 0 Complex Staple / Suture removal (26 or more) []  - 0 Hypo / Hyperglycemic Management (close monitor of Blood Glucose) []  - 0 Ankle / Brachial Index (ABI) - do not check if billed separately X- 1 5 Vital Signs Has the patient been seen at the hospital within the last three years: Yes Total Score: 120 Level Of Care: New/Established - Level 4 Electronic Signature(s) Signed: 11/27/2020 6:31:57 PM By: Deon Pilling Entered By: Deon Pilling on 11/27/2020 16:03:58 -------------------------------------------------------------------------------- Encounter Discharge Information Details Patient Name: Date of Service: [name], [name] 11/27/2020 3:15 PM Medical Record Number: [id number] Patient Account Number: [id number] Date of Birth/Sex: Treating RN: [birth year] (87 y.o. [name] Primary Care Anna Livers: PA Haig Prophet, [place] Other Clinician: Referring Dorin Stooksbury: Treating Shawnte Demarest/Extender: Colon Branch in Treatment: 11 Encounter Discharge Information Items Discharge Condition:  Stable Ambulatory Status: Wheelchair Discharge Destination: Home Transportation: Private Auto Accompanied By: Charolett Bumpers Schedule Follow-up Appointment: Yes Clinical Summary of Care: Patient Declined Electronic Signature(s) Signed: 11/27/2020 5:26:54 PM By: Baruch Gouty RN, BSN Entered By: Baruch Gouty on 11/27/2020 17:20:36 -------------------------------------------------------------------------------- Multi Wound Chart Details Patient Name: Date of Service: [name], [name] 11/27/2020 3:15 PM Medical Record Number: [id number] Patient Account Number: [id number] Date of Birth/Sex: Treating RN: 05/02/1922 (87 y.o. [name] Primary Care Violeta Lecount: PA TIENT, NO Other Clinician: Referring Cylis Ayars: Treating Cary Wilford/Extender: Colon Branch in Treatment: 58 Vital Signs Height(in): 71 Pulse(bpm): 89 Weight(lbs): 175 Blood Pressure(mmHg): 100/59 Body Mass Index(BMI): 24 Temperature(F): 97.8 Respiratory Rate(breaths/min): 16 Photos: [1:Sacrum] [N/A:N/A N/A] Wound Location: [1:Pressure Injury] [N/A:N/A] Wounding Event: [1:Pressure Ulcer] [N/A:N/A] Primary Etiology: [1:Glaucoma, Chronic Obstructive] [N/A:N/A] Comorbid History: [1:Pulmonary Disease (COPD), Coronary Artery Disease, Hypertension, Type II Diabetes, Osteoarthritis, Dementia 09/03/2019] [N/A:N/A] Date Acquired: [1:58] [N/A:N/A] Weeks of Treatment: [1:Open] [N/A:N/A] Wound Status: [1:2x2x1.2] [N/A:N/A] Measurements L x W x D (cm) [1:3.142] [N/A:N/A] A (cm) : rea [1:3.77] [N/A:N/A] Volume (cm) : [1:64.00%] [N/A:N/A] % Reduction in A rea: [1:74.60%] [N/A:N/A] % Reduction in Volume: [1:10] Starting Position 1 (o'clock): [1:5] Ending Position 1 (o'clock): [1:2.6] Maximum Distance 1 (cm): [1:Yes] [N/A:N/A] Undermining: [1:Category/Stage IV] [N/A:N/A] Classification: [1:Medium] [N/A:N/A] Exudate A mount: [1:Serosanguineous] [N/A:N/A] Exudate Type: [1:red, brown] [N/A:N/A] Exudate Color: [1:Well  defined, not attached] [N/A:N/A] Wound Margin: [1:Large (67-100%)] [N/A:N/A] Granulation A mount: [1:Pink] [N/A:N/A] Granulation Quality: [1:Small (1-33%)] [N/A:N/A] Necrotic A mount: [1:Fat Layer (Subcutaneous Tissue): Yes N/A] Exposed Structures: [1:Fascia: No Tendon: No Muscle: No Joint: No Bone: No Small (1-33%)] [N/A:N/A] Treatment Notes Electronic Signature(s) Signed: 11/27/2020 4:57:19 PM By: Kalman Shan DO Signed: 11/27/2020 6:31:57 PM By: Deon Pilling Entered By: Kalman Shan on 11/27/2020 16:47:06 -------------------------------------------------------------------------------- Multi-Disciplinary Care Plan Details Patient Name: Date of Service: [name], [name] 11/27/2020 3:15 PM Medical Record Number: [id number] Patient Account Number: [id number] Date of Birth/Sex: Treating RN: July 17, 1922 (87 y.o. [name] Primary Care Antoinette Borgwardt: PA Haig Prophet, [place] Other Clinician: Referring Rubens Cranston: Treating Amil Bouwman/Extender: Colon Branch in Treatment: 58 [place] reviewed with physician Active Inactive Wound/Skin Impairment Nursing Diagnoses: Impaired tissue integrity Knowledge deficit related to ulceration/compromised skin integrity Goals: Patient/caregiver will verbalize understanding of skin care regimen Date Initiated: 10/17/2019 Target Resolution Date: 12/26/2020 Goal Status: Active Ulcer/skin breakdown will have a volume reduction of 30% by week 4 Date Initiated: 10/17/2019 Date Inactivated: 12/28/2019 Target Resolution Date: 12/28/2019 Goal Status: Unmet Unmet Reason: chronic wound Interventions: Assess patient/caregiver ability to obtain necessary supplies Assess patient/caregiver ability to perform ulcer/skin care regimen upon admission and as needed Assess ulceration(s) every visit Provide education on ulcer and skin care Treatment Activities: Skin care regimen initiated : 10/17/2019 Topical wound management  initiated : 10/17/2019 Notes: Electronic Signature(s) Signed: 11/27/2020 6:31:57 PM By: Deon Pilling Entered By: Deon Pilling on 11/27/2020 15:31:38 -------------------------------------------------------------------------------- Pain Assessment Details Patient Name: Date of Service: [name], [name] 11/27/2020 3:15 PM Medical Record Number: [id number] Patient Account Number: [id number] Date of Birth/Sex: Treating RN: 08/28/1921 (87 y.o. [name] Primary Care Cathy Crounse: PA Haig Prophet, [place] Other Clinician: Referring Hiawatha Dressel: Treating Leota Maka/Extender: Colon Branch in Treatment: 58 Active Problems Location of Pain Severity and Description of Pain Patient Has Paino No Site Locations Pain Management and Medication Current Pain Management: Electronic Signature(s) Signed: 12/01/2020 5:39:21 PM By: Levan Hurst RN, BSN Entered By: Levan Hurst on 11/27/2020 15:25:15 -------------------------------------------------------------------------------- Patient/Caregiver Education Details Patient Name: Date of Service: [facility] 4/28/2022andnbsp3:15 PM Medical Record Number: [id number] Patient Account Number: [id number] Date of Birth/Gender: Treating RN: 17-Feb-1922 (87 y.o. [name] Primary Care Physician: PA Haig Prophet, NO Other Clinician: Referring Physician: Treating Physician/Extender: Colon Branch in Treatment: 23 Education Assessment Education Provided To: Patient Education Topics Provided Wound/Skin Impairment: Handouts: Skin Care Do's and Dont's Methods: Explain/Verbal Responses: Reinforcements needed Electronic Signature(s) Signed: 11/27/2020 6:31:57 PM By: Deon Pilling Entered By: Deon Pilling on 11/27/2020 15:31:49 -------------------------------------------------------------------------------- Wound Assessment Details Patient Name: Date of Service: [name], [name] 11/27/2020 3:15 PM Medical Record  Number: [id number] Patient Account Number: [id number] Date of Birth/Sex: Treating RN: [birth year] (87 y.o. [name] Primary Care Marshae Azam: PA TIENT, NO Other Clinician: Referring Pj Zehner: Treating Aliceson Dolbow/Extender: Colon Branch in Treatment: 58 Wound Status Wound Number: 1 Primary Pressure Ulcer Etiology: Wound Location: Sacrum Wound Open Wounding Event: Pressure Injury Status: Date Acquired: 09/03/2019 Comorbid Glaucoma, Chronic Obstructive Pulmonary Disease (COPD), Weeks Of Treatment: 58 History: Coronary Artery Disease, Hypertension, Type II Diabetes, Clustered Wound: No Osteoarthritis, Dementia Photos Wound Measurements Length: (cm) 2 Width: (cm) 2 Depth: (cm) 1.2 Area: (cm) 3.142 Volume: (cm) 3.77 % Reduction in Area: 64% % Reduction in Volume: 74.6% Epithelialization: Small (1-33%) Tunneling: No Undermining: Yes Starting Position (o'clock): 10 Ending Position (o'clock): 5 Maximum Distance: (cm) 2.6 Wound Description Classification: Category/Stage IV Wound Margin: Well defined, not attached Exudate Amount: Medium Exudate Type: Serosanguineous Exudate Color: red, brown Foul Odor After Cleansing: No Slough/Fibrino Yes Wound Bed Granulation Amount: Large (67-100%) Exposed Structure Granulation Quality: Pink Fascia Exposed: No Necrotic Amount: Small (1-33%) Fat Layer (Subcutaneous Tissue) Exposed: Yes Necrotic Quality: Adherent Slough Tendon Exposed: No Muscle Exposed: No Joint Exposed: No Bone Exposed: No Treatment Notes Wound #1 (Sacrum) Cleanser Normal Saline Discharge Instruction: Cleanse the wound with Normal Saline prior  to applying a clean dressing using gauze sponges, not tissue or cotton balls. Peri-Wound Care Topical Primary Dressing Promogran Prisma Matrix, 4.34 (sq in) (silver collagen) Discharge Instruction: moisten with normal saline under wound vac. Wound center to apply prisma. Secondary Dressing Woven  Gauze Sponge, Non-Sterile 4x4 in Discharge Instruction: saline moisten gauze over Prisma in clinic. ComfortFoam Border, 3x3 in (silicone border) Discharge Instruction: Apply over primary dressing as directed. Secured With Compression Wrap Compression Stockings Environmental education officer) Signed: 11/28/2020 4:35:52 PM By: Sandre Kitty Signed: 12/01/2020 5:39:21 PM By: Levan Hurst RN, BSN Entered By: Sandre Kitty on 11/27/2020 16:33:53 -------------------------------------------------------------------------------- Vitals Details Patient Name: Date of Service: [name], [name] 11/27/2020 3:15 PM Medical Record Number: [id number] Patient Account Number: [id number] Date of Birth/Sex: Treating RN: [birth year] (87 y.o. [name] Primary Care Tieara Flitton: PA TIENT, NO Other Clinician: Referring Gabriela Giannelli: Treating Garvey Westcott/Extender: Colon Branch in Treatment: 58 Vital Signs Time Taken: 15:24 Temperature (F): 97.8 Height (in): 71 Pulse (bpm): 89 Weight (lbs): 175 Respiratory Rate (breaths/min): 16 Body Mass Index (BMI): 24.4 Blood Pressure (mmHg): 100/59 Reference Range: 80 - 120 mg / dl Electronic Signature(s) Signed: 12/01/2020 5:39:21 PM By: Levan Hurst RN, BSN Entered By: Levan Hurst on 11/27/2020 15:25:10

## 2020-12-10 DIAGNOSIS — L89302 Pressure ulcer of unspecified buttock, stage 2: Secondary | ICD-10-CM | POA: Diagnosis not present

## 2020-12-22 DIAGNOSIS — L89302 Pressure ulcer of unspecified buttock, stage 2: Secondary | ICD-10-CM | POA: Diagnosis not present

## 2020-12-23 DIAGNOSIS — L89302 Pressure ulcer of unspecified buttock, stage 2: Secondary | ICD-10-CM | POA: Diagnosis not present

## 2020-12-25 ENCOUNTER — Encounter (HOSPITAL_BASED_OUTPATIENT_CLINIC_OR_DEPARTMENT_OTHER): Payer: Medicare HMO | Admitting: Internal Medicine

## 2021-01-07 DIAGNOSIS — L89302 Pressure ulcer of unspecified buttock, stage 2: Secondary | ICD-10-CM | POA: Diagnosis not present

## 2021-01-21 DIAGNOSIS — L89302 Pressure ulcer of unspecified buttock, stage 2: Secondary | ICD-10-CM | POA: Diagnosis not present

## 2021-01-29 ENCOUNTER — Other Ambulatory Visit (HOSPITAL_COMMUNITY)
Admission: RE | Admit: 2021-01-29 | Discharge: 2021-01-29 | Disposition: A | Discharge status: Home / Self Care | Payer: Medicare HMO | Source: Other Acute Inpatient Hospital | Attending: Internal Medicine | Admitting: Internal Medicine

## 2021-01-29 ENCOUNTER — Encounter (HOSPITAL_BASED_OUTPATIENT_CLINIC_OR_DEPARTMENT_OTHER): Payer: Medicare HMO | Attending: Internal Medicine | Admitting: Internal Medicine

## 2021-01-29 ENCOUNTER — Other Ambulatory Visit (HOSPITAL_BASED_OUTPATIENT_CLINIC_OR_DEPARTMENT_OTHER): Payer: Self-pay | Admitting: Internal Medicine

## 2021-01-29 ENCOUNTER — Other Ambulatory Visit: Payer: Self-pay

## 2021-01-29 DIAGNOSIS — E11621 Type 2 diabetes mellitus with foot ulcer: Secondary | ICD-10-CM | POA: Diagnosis not present

## 2021-01-29 DIAGNOSIS — R6889 Other general symptoms and signs: Secondary | ICD-10-CM | POA: Diagnosis not present

## 2021-01-29 DIAGNOSIS — I10 Essential (primary) hypertension: Secondary | ICD-10-CM | POA: Insufficient documentation

## 2021-01-29 DIAGNOSIS — L89154 Pressure ulcer of sacral region, stage 4: Secondary | ICD-10-CM | POA: Insufficient documentation

## 2021-01-29 DIAGNOSIS — M4628 Osteomyelitis of vertebra, sacral and sacrococcygeal region: Secondary | ICD-10-CM | POA: Diagnosis not present

## 2021-01-29 DIAGNOSIS — F0150 Vascular dementia without behavioral disturbance: Secondary | ICD-10-CM | POA: Diagnosis not present

## 2021-01-29 NOTE — Progress Notes (Signed)
[name], [name] ([id number]) Visit Report for 01/29/2021 Debridement Details Patient Name: Date of Service: [name], [name] 01/29/2021 1:15 PM Medical Record Number: [id number] Patient Account Number: [id number] Date of Birth/Sex: Treating RN: March 18, 1922 (87 y.o. [name] Primary Care Provider: PA Haig Prophet, NO Other Clinician: Referring Provider: Treating Provider/Extender: Frederick Peers in Treatment: 67 Debridement Performed for Assessment: Wound #1 Sacrum Performed By: Physician Ricard Dillon., MD Debridement Type: Debridement Level of Consciousness (Pre-procedure): Awake and Alert Pre-procedure Verification/Time Out Yes - 14:10 Taken: Start Time: 14:11 Pain Control: Lidocaine 4% T opical Solution T Area Debrided (L x W): otal 0.5 (cm) x 0.5 (cm) = 0.25 (cm) Tissue and other material debrided: Viable, Bone Level: Skin/Subcutaneous Tissue/Muscle/Bone Debridement Description: Excisional Instrument: Rongeur Specimen: Swab, Number of Specimens T aken: 2 Bleeding: Moderate Hemostasis Achieved: Silver Nitrate End Time: 14:15 Procedural Pain: 0 Post Procedural Pain: 0 Response to Treatment: Procedure was tolerated well Level of Consciousness (Post- Awake and Alert procedure): Post Debridement Measurements of Total Wound Length: (cm) 1.5 Stage: Category/Stage IV Width: (cm) 1 Depth: (cm) 1.3 Volume: (cm) 1.532 Character of Wound/Ulcer Post Debridement: Stable Post Procedure Diagnosis Same as Pre-procedure Electronic Signature(s) Signed: 01/29/2021 5:19:12 PM By: Linton Ham MD Signed: 01/29/2021 6:57:58 PM By: Deon Pilling Entered By: Linton Ham on 01/29/2021 14:23:45 -------------------------------------------------------------------------------- HPI Details Patient Name: Date of Service: [name], [name] 01/29/2021 1:15 PM Medical Record Number: [id number] Patient Account Number: [id number] Date of Birth/Sex: Treating RN: 19-Oct-1921  (87 y.o. [name] Primary Care Provider: PA Haig Prophet, NO Other Clinician: Referring Provider: Treating Provider/Extender: Frederick Peers in Treatment: 67 History of Present Illness HPI Description: /A3/17/2021 upon evaluation today patient presents for initial inspection here in our clinic concerning issues that has been having with a wound in [name] sacral region as well as [name] left heel. Fortunately neither appears to be overtly and significantly infected there is some necrotic tissue noted at both locations however that is going to be required to be addressed. He does have a history of diabetes mellitus type 2, vascular dementia, and hypertension. [name] patient did have a stroke in November 2020 unfortunately he has not been [name] same since that time according to his daughter. Prior to that he was taking care of himself now he is 100% dependent. He is at home with his daughter who is present during [name] office visit today as well that is where I get [name] majority of [name] history from at this point. Patient does have a history of hypertension as well which does not appear to be too out of control today which is good news. He does have a hemoglobin A1c of 6.05 May 2019. They currently have been using a calcium alginate dressing to [name] sacrum and it sounds like Skin-Prep and a foam on [name] heel. 4/9; pressure ulcers on [name] sacral area and left heel. Been using Anasept wet-to-dry on [name] sacrum and Hydrofera Blue in [name] heel. He has home health family is helping with [name] dressing. It sounds as though they are fairly religious about offloading these areas that have pressure relief surfaces for [name] bed. Wounds look better 4/30; I have not seen this patient in 3 weeks. We have been using Anasept wet-to-dry [name] sacrum and Hydrofera Blue on [name] heel. When I saw this 3 weeks ago [name] sacrum actually look quite good and I thought that we might be able to transition him to a collagen-based  dressing today. 5/10; [name] since  [name] patient was last here he was hospitalized from 5/5 through 12/07/2019. He was noted to have a pressure injury of [name] sacral region stage IV. MRI did not suggest discitis or osteomyelitis. There was nonspecific bilateral paraspinal muscle edema. Culture I did when he was here [name] last time showed E. coli I believe they are aware of this. Blood cultures were negative. His white count was 10.8 on admission 11.9 at discharge his comprehensive metabolic panel notable for an albumin of 2.4 BUN of 32 with creatinine of 0.03 hemoglobin was only 7.3 with a discharge hemoglobin of 8.1 after dropping to a hemoglobin of 6.6 He is back at home. Discharge antibiotic was Augmentin.[name] He was treated with IV Rocephin in [name] hospital. [name] patient was seen by palliative care I have not looked at this consult. They have been using dressings that they had left over at home I think most recently [facility]. According to his daughter [name] patient is eating fairly well 12/28/19-Patient is back at 2 weeks, [name] sacral wound is about [name] same, [name] right heel has healed and closed. We are using silver alginate to [name] sacral wound 6/18; we continue to follow [name] sacral wound. His heel wounds have closed. We have been using silver alginate to [name] sacrum 7/1; sacral wound. We have been using silver collagen with backing wet-to-dry. No open wounds on [name] heels. 7/15; some improvement in [name] dimensions of [name] sacral wound including [name] undermining area we have been using silver collagen with backing wet-to-dry dressings. We had some thoughts about trying a wound VAC. He has home health. 03/13/20-Sacral wound looks intact with healthy base, surrounding skin intact, no maceration, he did unfortunately develop to abrasion wounds on his first second and third toes dorsum of [name] Right foot this happened when his foot got trapped below [name] wheelchair Footguard 8/30; sacral wound has come down slightly  especially [name] undermining. We have been using a wound VAC She has 2 necrotic areas on [name] tip of [name] right first toe and [name] right second toe dorsally. 10/8 Is been about 5 weeks since I have seen this man. Family tells me that [name] sacrum really deteriorated over [name] last week or 2. [name] blame home health for some back issues. Dimensions here are a lot larger than last time including [name] undermining and there is now exposed bone. He had an MRI done in May when he was hospitalized at this area that did not show osteomyelitis nevertheless there is been a fairly significant deterioration since I last saw this wound. He has small abrasions on [name] right dorsal second and third toes which look as though they are progressing towards closure 10/22; [name] patient came in 2 weeks ago with exposed bone. Sample showed osteomyelitis culture Enterococcus. Given his frailty and [name] state of his dementia I elected to try to treat him with oral Augmentin currently on 500 3 times daily. I am going to try to keep him on this for 6 weeks. He is eating and drinking well taking [name] antibiotics well. So far no overt side effects 11/12; unfortunately [name] patient was away from clinic longer than I thought he was going to be in they ran out of Augmentin about a week ago. I'm going to renew this but reduce [name] dose somewhat to twice a day. His wound looks better we've been using silver alginate. No exposed bone today. 12/7; [name] patient is finished Augmentin had roughly 28 days. For some reason pharmacy will not fill  it 2-week supply of liquid Augmentin, in any case I am finished giving him antibiotics. This was for [name] bone specimen that showed Enterococcus he has been on this for at least 6 weeks. Some improvement in wound area. There is still some exposed bone superiorly in [name] swollen 08/12/2020; 1 month follow-up. We treated him for underlying osteomyelitis. He is using silver collagen backing wet-to-dry. Advanced dementia  precludes use of a wound VAC. I did talk with his daughter today about advanced treatment products but she is having to change [name] dressing too frequently during [name] week to make this possible. He is not a candidate for plastic surgery therefore I really think that [name] collagen backing wet-to- dry is [name] best option here. He has 1.1 cm in direct depth and 1.9 cm and undermining from 11-6 o'clock. 2/11; 1 month follow-up. [name] wound looks essentially [name] same. This does not probe to bone [name] base of it seems to have healthy granulation but in terms of [name] overall wound volume undermining I think this is all about [name] same. I went over [name] options last visit with his daughter which would include an advanced treatment product necessitating more frequent visits. Because of his dementia and agitation I do not think he is a candidate for wound VAC or plastic surgery 3/17; 1 month follow-up. [name] wound looks generally [name] same. Lower sacrum healthy granulation no exposed bone minimal amount of undermining. He is family reminds me that we did try wound VAC back in [name] early part of [name] fall last year however they are wondering whether we could try it again. Looking back over our records this was interrupted by exposed bone and probable osteomyelitis. I treated him with at least 4 weeks of oral Augmentin. I did not think we can get him through an MRI. 4/28; 1 month follow-up. Patient is currently using a wound VAC. Patient's daughter is present. There are no issues or complaints today. 6/30; its been 2 months since [name] patient was in [name] clinic. He is still using silver collagen on [name] wound bed under wound VAC. He arrives in clinic today with [name] dimensions better including, however and right in [name] middle of this wide open area of exposed bone. There is no obvious infection. This is not [name] first time this is happening. I treated him previously with 4 weeks of oral Augmentin. Electronic Signature(s) Signed:  01/29/2021 5:19:12 PM By: Linton Ham MD Entered By: Linton Ham on 01/29/2021 14:25:04 -------------------------------------------------------------------------------- Physical Exam Details Patient Name: Date of Service: [name], [name] 01/29/2021 1:15 PM Medical Record Number: [id number] Patient Account Number: [id number] Date of Birth/Sex: Treating RN: Oct 04, 1921 (87 y.o. [name] Primary Care Provider: PA Haig Prophet, NO Other Clinician: Referring Provider: Treating Provider/Extender: Frederick Peers in Treatment: 67 Constitutional Sitting or standing Blood Pressure is within target range for patient.. Pulse regular and within target range for patient.[name] Respirations regular, non-labored and within target range.. Temperature is normal and within [name] target range for [name] patient.[name] Appears in no distress. Notes Wound exam sacral ulcer; real deterioration in [name] middle of this wound exposed area of bone 1 x 1 cm. I used rongeurs to obtain a specimen for pathology and culture I treated him empirically for this earlier in [name] stay I am going to wait for [name] culture to come back before I consider additional antibiotics. Very disappointing since [name] wound has contracted nicely even in terms of depth and undermining Electronic Signature(s) Signed: 01/29/2021 5:19:12  PM By: Linton Ham MD Entered By: Linton Ham on 01/29/2021 14:26:09 -------------------------------------------------------------------------------- Physician Orders Details Patient Name: Date of Service: [address] [name], [name] 01/29/2021 1:15 PM Medical Record Number: [id number] Patient Account Number: [id number] Date of Birth/Sex: Treating RN: 10-05-1921 (87 y.o. [name] Primary Care Provider: PA Haig Prophet, NO Other Clinician: Referring Provider: Treating Provider/Extender: Frederick Peers in Treatment: 67 Verbal / Phone Orders: No Diagnosis Coding ICD-10 Coding Code  Description L89.154 Pressure ulcer of sacral region, stage 4 M86.18 Other acute osteomyelitis, other site Follow-up Appointments ppointment in 2 weeks. - Dr. Dellia Nims ***extra time [facility] lift*** Return A Bathing/ Shower/ Hygiene May shower and wash wound with soap and water. - on days dressing changed Negative Presssure Wound Therapy Wound Vac to wound continuously at 118mm/hg pressure - continue same dressing until wound vac arrives. home health to apply three times a week with PRISMA under vac. ***PLEASE ENSURE TO PACK BLACK FOAM INTO WOUND AND UNDERMINING.*** Black Foam - ***PLEASE ENSURE TO PACK BLACK FOAM INTO WOUND AND UNDERMINING.*** Edema Control - Lymphedema / SCD / Other Elevate legs to [name] level of [name] heart or above for 30 minutes daily and/or when sitting, a frequency of: Off-Loading Low air-loss mattress (Group 2) Turn and reposition every 2 hours Other: - place pillows under calf to float heels Home Health No change in wound care orders this week; continue Home Health for wound care. May utilize formulary equivalent dressing for wound treatment orders unless otherwise specified. - home health to apply three times a week with prisma under vac. wound center to apply prisma and wet to dry dressing in clinic. home health will reapply vac after wound care appts or [name] following day. Other Home Health Orders/Instructions: - Advance home health Wound Treatment Wound #1 - Sacrum Cleanser: Normal Saline (Generic) Other:3 times per week and when soiled/30 Days Discharge Instructions: Cleanse [name] wound with Normal Saline prior to applying a clean dressing using gauze sponges, not tissue or cotton balls. Prim Dressing: Promogran Prisma Matrix, 4.34 (sq in) (silver collagen) (Generic) Other:3 times per week and when soiled/30 Days ary Discharge Instructions: moisten with normal saline under wound vac. Wound center to apply prisma. Secondary Dressing: Woven Gauze Sponge, Non-Sterile 4x4  in (Generic) Other:3 times per week and when soiled/30 Days Discharge Instructions: saline moisten gauze over Prisma in clinic. Secondary Dressing: ComfortFoam Border, 3x3 in (silicone border) (Generic) Other:3 times per week and when soiled/30 Days Discharge Instructions: Apply over primary dressing as directed. Laboratory naerobe culture (MICRO) - bone culture of sacral wound. - (ICD10 L89.154 - Pressure ulcer of Bacteria identified in Unspecified specimen by A sacral region, stage 4) LOINC Code: 656-8 Convenience Name: Anerobic culture Bacteria identified in Tissue by Biopsy culture (MICRO) - bone biopsy of sacral wound. - (ICD10 L89.154 - Pressure ulcer of sacral region, stage 4) LOINC Code: 12751-7 Convenience Name: Biopsy specimen culture Electronic Signature(s) Signed: 01/29/2021 5:19:12 PM By: Linton Ham MD Signed: 01/29/2021 6:57:58 PM By: Deon Pilling Entered By: Deon Pilling on 01/29/2021 14:17:43 -------------------------------------------------------------------------------- Problem List Details Patient Name: Date of Service: [name], [name] 01/29/2021 1:15 PM Medical Record Number: [id number] Patient Account Number: [id number] Date of Birth/Sex: Treating RN: September 06, 1921 (87 y.o. [name] Primary Care Provider: PA Haig Prophet, [place] Other Clinician: Referring Provider: Treating Provider/Extender: Frederick Peers in Treatment: 67 Active Problems ICD-10 Encounter Code Description Active Date MDM Diagnosis L89.154 Pressure ulcer of sacral region, stage 4 10/17/2019 No Yes M86.18 Other acute  osteomyelitis, other site 05/23/2020 No Yes Inactive Problems ICD-10 Code Description Active Date Inactive Date E11.621 Type 2 diabetes mellitus with foot ulcer 10/17/2019 10/17/2019 L97.522 Non-pressure chronic ulcer of other part of left foot with fat layer exposed 10/17/2019 10/17/2019 F01.50 Vascular dementia without behavioral disturbance 10/17/2019  10/17/2019 I10 Essential (primary) hypertension 10/17/2019 10/17/2019 L97.518 Non-pressure chronic ulcer of other part of right foot with other specified severity 03/31/2020 03/31/2020 L03.031 Cellulitis of right toe 03/31/2020 03/31/2020 Resolved Problems Electronic Signature(s) Signed: 01/29/2021 5:19:12 PM By: Linton Ham MD Entered By: Linton Ham on 01/29/2021 14:23:24 -------------------------------------------------------------------------------- Progress Note Details Patient Name: Date of Service: [name], [name] 01/29/2021 1:15 PM Medical Record Number: [id number] Patient Account Number: [id number] Date of Birth/Sex: Treating RN: July 23, 1922 (87 y.o. [name] Primary Care Provider: PA Haig Prophet, NO Other Clinician: Referring Provider: Treating Provider/Extender: Frederick Peers in Treatment: 67 Subjective History of Present Illness (HPI) /A3/17/2021 upon evaluation today patient presents for initial inspection here in our clinic concerning issues that has been having with a wound in [name] sacral region as well as [name] left heel. Fortunately neither appears to be overtly and significantly infected there is some necrotic tissue noted at both locations however that is going to be required to be addressed. He does have a history of diabetes mellitus type 2, vascular dementia, and hypertension. [name] patient did have a stroke in November 2020 unfortunately he has not been [name] same since that time according to his daughter. Prior to that he was taking care of himself now he is 100% dependent. He is at home with his daughter who is present during [name] office visit today as well that is where I get [name] majority of [name] history from at this point. Patient does have a history of hypertension as well which does not appear to be too out of control today which is good news. He does have a hemoglobin A1c of 6.05 May 2019. They currently have been using a calcium alginate  dressing to [name] sacrum and it sounds like Skin-Prep and a foam on [name] heel. 4/9; pressure ulcers on [name] sacral area and left heel. Been using Anasept wet-to-dry on [name] sacrum and Hydrofera Blue in [name] heel. He has home health family is helping with [name] dressing. It sounds as though they are fairly religious about offloading these areas that have pressure relief surfaces for [name] bed. Wounds look better 4/30; I have not seen this patient in 3 weeks. We have been using Anasept wet-to-dry [name] sacrum and Hydrofera Blue on [name] heel. When I saw this 3 weeks ago [name] sacrum actually look quite good and I thought that we might be able to transition him to a collagen-based dressing today. 5/10; [name] since [name] patient was last here he was hospitalized from 5/5 through 12/07/2019. He was noted to have a pressure injury of [name] sacral region stage IV. MRI did not suggest discitis or osteomyelitis. There was nonspecific bilateral paraspinal muscle edema. Culture I did when he was here [name] last time showed E. coli I believe they are aware of this. Blood cultures were negative. His white count was 10.8 on admission 11.9 at discharge his comprehensive metabolic panel notable for an albumin of 2.4 BUN of 32 with creatinine of 0.03 hemoglobin was only 7.3 with a discharge hemoglobin of 8.1 after dropping to a hemoglobin of 6.6 He is back at home. Discharge antibiotic was Augmentin.[name] He was treated with IV Rocephin in [name] hospital. [name] patient was  seen by palliative care I have not looked at this consult. They have been using dressings that they had left over at home I think most recently [name]. According to his daughter [name] patient is eating fairly well 12/28/19-Patient is back at 2 weeks, [name] sacral wound is about [name] same, [name] right heel has healed and closed. We are using silver alginate to [name] sacral wound 6/18; we continue to follow [name] sacral wound. His heel wounds have closed. We have been using silver  alginate to [name] sacrum 7/1; sacral wound. We have been using silver collagen with backing wet-to-dry. No open wounds on [name] heels. 7/15; some improvement in [name] dimensions of [name] sacral wound including [name] undermining area we have been using silver collagen with backing wet-to-dry dressings. We had some thoughts about trying a wound VAC. He has home health. 03/13/20-Sacral wound looks intact with healthy base, surrounding skin intact, no maceration, he did unfortunately develop to abrasion wounds on his first second and third toes dorsum of [name] Right foot this happened when his foot got trapped below [name] wheelchair Footguard 8/30; sacral wound has come down slightly especially [name] undermining. We have been using a wound VAC ooShe has 2 necrotic areas on [name] tip of [name] right first toe and [name] right second toe dorsally. 10/8 Is been about 5 weeks since I have seen this man. Family tells me that [name] sacrum really deteriorated over [name] last week or 2. [name] blame home health for some back issues. Dimensions here are a lot larger than last time including [name] undermining and there is now exposed bone. He had an MRI done in May when he was hospitalized at this area that did not show osteomyelitis nevertheless there is been a fairly significant deterioration since I last saw this wound. He has small abrasions on [name] right dorsal second and third toes which look as though they are progressing towards closure 10/22; [name] patient came in 2 weeks ago with exposed bone. Sample showed osteomyelitis culture Enterococcus. Given his frailty and [name] state of his dementia I elected to try to treat him with oral Augmentin currently on 500 3 times daily. I am going to try to keep him on this for 6 weeks. He is eating and drinking well taking [name] antibiotics well. So far no overt side effects 11/12; unfortunately [name] patient was away from clinic longer than I thought he was going to be in they ran out of Augmentin about a  week ago. I'm going to renew this but reduce [name] dose somewhat to twice a day. His wound looks better we've been using silver alginate. No exposed bone today. 12/7; [name] patient is finished Augmentin had roughly 28 days. For some reason pharmacy will not fill it 2-week supply of liquid Augmentin, in any case I am finished giving him antibiotics. This was for [name] bone specimen that showed Enterococcus he has been on this for at least 6 weeks. Some improvement in wound area. There is still some exposed bone superiorly in [name] swollen 08/12/2020; 1 month follow-up. We treated him for underlying osteomyelitis. He is using silver collagen backing wet-to-dry. Advanced dementia precludes use of a wound VAC. I did talk with his daughter today about advanced treatment products but she is having to change [name] dressing too frequently during [name] week to make this possible. He is not a candidate for plastic surgery therefore I really think that [name] collagen backing wet-to- dry is [name] best option here. He has 1.1  cm in direct depth and 1.9 cm and undermining from 11-6 o'clock. 2/11; 1 month follow-up. [name] wound looks essentially [name] same. This does not probe to bone [name] base of it seems to have healthy granulation but in terms of [name] overall wound volume undermining I think this is all about [name] same. I went over [name] options last visit with his daughter which would include an advanced treatment product necessitating more frequent visits. Because of his dementia and agitation I do not think he is a candidate for wound VAC or plastic surgery 3/17; 1 month follow-up. [name] wound looks generally [name] same. Lower sacrum healthy granulation no exposed bone minimal amount of undermining. He is family reminds me that we did try wound VAC back in [name] early part of [name] fall last year however they are wondering whether we could try it again. Looking back over our records this was interrupted by exposed bone and probable  osteomyelitis. I treated him with at least 4 weeks of oral Augmentin. I did not think we can get him through an MRI. 4/28; 1 month follow-up. Patient is currently using a wound VAC. Patient's daughter is present. There are no issues or complaints today. 6/30; its been 2 months since [name] patient was in [name] clinic. He is still using silver collagen on [name] wound bed under wound VAC. He arrives in clinic today with [name] dimensions better including, however and right in [name] middle of this wide open area of exposed bone. There is no obvious infection. This is not [name] first time this is happening. I treated him previously with 4 weeks of oral Augmentin. Objective Constitutional Sitting or standing Blood Pressure is within target range for patient.. Pulse regular and within target range for patient.[name] Respirations regular, non-labored and within target range.. Temperature is normal and within [name] target range for [name] patient.[name] Appears in no distress. Vitals Time Taken: 1:41 PM, Height: 71 in, Weight: 175 lbs, BMI: 24.4, Temperature: 98.3 F, Pulse: 74 bpm, Respiratory Rate: 16 breaths/min, Blood Pressure: 103/60 mmHg. General Notes: Wound exam sacral ulcer; real deterioration in [name] middle of this wound exposed area of bone 1 x 1 cm. I used rongeurs to obtain a specimen for pathology and culture I treated him empirically for this earlier in [name] stay I am going to wait for [name] culture to come back before I consider additional antibiotics. Very disappointing since [name] wound has contracted nicely even in terms of depth and undermining Integumentary (Hair, Skin) Wound #1 status is Open. Original cause of wound was Pressure Injury. [name] date acquired was: 09/03/2019. [name] wound has been in treatment 67 weeks. [name] wound is located on [name] Sacrum. [name] wound measures 1.5cm length x 1cm width x 1.3cm depth; 1.178cm^2 area and 1.532cm^3 volume. There is bone and Fat Layer (Subcutaneous Tissue) exposed. There is no  tunneling noted, however, there is undermining starting at 12:00 and ending at 12:00 with a maximum distance of 2.4cm. There is a medium amount of serosanguineous drainage noted. [name] wound margin is well defined and not attached to [name] wound base. There is large (67-100%) pink granulation within [name] wound bed. There is no necrotic tissue within [name] wound bed. Assessment Active Problems ICD-10 Pressure ulcer of sacral region, stage 4 Other acute osteomyelitis, other site Procedures Wound #1 Pre-procedure diagnosis of Wound #1 is a Pressure Ulcer located on [name] Sacrum . There was a Excisional Skin/Subcutaneous Tissue/Muscle/Bone Debridement with a total area of 0.25 sq cm performed by Ricard Dillon.,  MD. With [name] following instrument(s): Rongeur to remove Viable tissue/material. Material removed includes Bone after achieving pain control using Lidocaine 4% T opical Solution. 2 specimens were taken by a Swab and sent to [name] lab per facility protocol. A time out was conducted at 14:10, prior to [name] start of [name] procedure. A Moderate amount of bleeding was controlled with Silver Nitrate. [name] procedure was tolerated well with a pain level of 0 throughout and a pain level of 0 following [name] procedure. Post Debridement Measurements: 1.5cm length x 1cm width x 1.3cm depth; 1.532cm^3 volume. Post debridement Stage noted as Category/Stage IV. Character of Wound/Ulcer Post Debridement is stable. Post procedure Diagnosis Wound #1: Same as Pre-Procedure Plan Follow-up Appointments: Return Appointment in 2 weeks. - Dr. Dellia Nims ***extra time [facility] lift*** Bathing/ Shower/ Hygiene: May shower and wash wound with soap and water. - on days dressing changed Negative Presssure Wound Therapy: Wound Vac to wound continuously at 155mm/hg pressure - continue same dressing until wound vac arrives. home health to apply three times a week with PRISMA under vac. ***PLEASE ENSURE TO PACK BLACK FOAM INTO WOUND AND  UNDERMINING.*** Black Foam - ***PLEASE ENSURE TO PACK BLACK FOAM INTO WOUND AND UNDERMINING.*** Edema Control - Lymphedema / SCD / Other: Elevate legs to [name] level of [name] heart or above for 30 minutes daily and/or when sitting, a frequency of: Off-Loading: Low air-loss mattress (Group 2) Turn and reposition every 2 hours Other: - place pillows under calf to float heels Home Health: No change in wound care orders this week; continue Home Health for wound care. May utilize formulary equivalent dressing for wound treatment orders unless otherwise specified. - home health to apply three times a week with prisma under vac. wound center to apply prisma and wet to dry dressing in clinic. home health will reapply vac after wound care appts or [name] following day. Other Home Health Orders/Instructions: - [facility] Laboratory ordered were: Anerobic culture - bone culture of sacral wound., Biopsy specimen culture - bone biopsy of sacral wound. WOUND #1: - Sacrum Wound Laterality: Cleanser: Normal Saline (Generic) Other:3 times per week and when soiled/30 Days Discharge Instructions: Cleanse [name] wound with Normal Saline prior to applying a clean dressing using gauze sponges, not tissue or cotton balls. Prim Dressing: Promogran Prisma Matrix, 4.34 (sq in) (silver collagen) (Generic) Other:3 times per week and when soiled/30 Days ary Discharge Instructions: moisten with normal saline under wound vac. Wound center to apply prisma. Secondary Dressing: Woven Gauze Sponge, Non-Sterile 4x4 in (Generic) Other:3 times per week and when soiled/30 Days Discharge Instructions: saline moisten gauze over Prisma in clinic. Secondary Dressing: ComfortFoam Border, 3x3 in (silicone border) (Generic) Other:3 times per week and when soiled/30 Days Discharge Instructions: Apply over primary dressing as directed. 1. I continued with [name] silver collagen under [name] [facility] for now 2. Awaiting bone for pathology and  culture 3. Is likely going to require antibiotics. Usually I stop [name] wound VAC to identify osteomyelitis at least for a period of time probably do this again when we finally see [name] results of [name] bone Electronic Signature(s) Signed: 01/29/2021 5:19:12 PM By: Linton Ham MD Entered By: Linton Ham on 01/29/2021 14:27:01 -------------------------------------------------------------------------------- SuperBill Details Patient Name: Date of Service: [name], [name] 01/29/2021 Medical Record Number: [id number] Patient Account Number: [id number] Date of Birth/Sex: Treating RN: 03-13-1922 (87 y.o. [name] Primary Care Provider: PA Darnelle Spangle Other Clinician: Referring Provider: Treating Provider/Extender: Frederick Peers in Treatment: [phone]  Diagnosis Coding ICD-10 Codes Code Description L89.154 Pressure ulcer of sacral region, stage 4 M86.18 Other acute osteomyelitis, other site Facility Procedures CPT4 Code: 76195093 Description: 26712 - DEB BONE 20 SQ CM/< ICD-10 Diagnosis Description L89.154 Pressure ulcer of sacral region, stage 4 M86.18 Other acute osteomyelitis, other site Modifier: Quantity: 1 Physician Procedures CPT4: Description Modifier Code [id number] Debridement; bone (includes epidermis, dermis, subQ tissue, muscle and/or fascia, if performed) 1st 20 sqcm or less ICD-10 Diagnosis Description L89.154 Pressure ulcer of sacral region, stage 4 M86.18 Other acute  osteomyelitis, other site Quantity: 1 Electronic Signature(s) Signed: 01/29/2021 5:19:12 PM By: Linton Ham MD Entered By: Linton Ham on 01/29/2021 14:27:15

## 2021-02-03 NOTE — Progress Notes (Signed)
[name], [name] ([id number]) Visit Report for 01/29/2021 Arrival Information Details Patient Name: Date of Service: [name], [name] 01/29/2021 [name]:15 PM Medical Record Number: [id number] Patient Account Number: [id number] Date of Birth/Sex: Treating RN: 02/12/1922 (87 y.o. [name] Primary Care Jaeven Wanzer: PA TIENT, NO Other Clinician: Referring Lindaann Gradilla: Treating Graelyn Bihl/Extender: Frederick Peers in Treatment: 67 Visit Information History Since Last Visit Added or deleted any medications: No Patient Arrived: Wheel Chair Any new allergies or adverse reactions: No Arrival Time: 13:41 Had a fall or experienced change in No Accompanied By: caregiver activities of daily living that may affect Transfer Assistance: None risk of falls: Patient Identification Verified: Yes Signs or symptoms of abuse/neglect since last visito No Secondary Verification Process Completed: Yes Hospitalized since last visit: No Patient Requires Transmission-Based Precautions: No Implantable device outside of the clinic excluding No Patient Has Alerts: No cellular tissue based products placed in the [name] since last visit: Has Dressing in Place as Prescribed: Yes Pain Present Now: Unable to Respond Electronic Signature(s) Signed: 7/[name]/2022 10:25:49 AM By: Sandre Kitty Entered By: Sandre Kitty on 01/29/2021 13:41:38 -------------------------------------------------------------------------------- Encounter Discharge Information Details Patient Name: Date of Service: [name], [name] 01/29/2021 [name]:15 PM Medical Record Number: [id number] Patient Account Number: [id number] Date of Birth/Sex: Treating RN: 1922/02/16 (87 y.o. Gilman Buttner Primary Care Tavionna Grout: PA Haig Prophet, NO Other Clinician: Referring Abbagail Scaff: Treating Revia Nghiem/Extender: Frederick Peers in Treatment: 67 Encounter Discharge Information Items Post Procedure Vitals Discharge Condition:  Stable Temperature (F): 98.3 Ambulatory Status: Wheelchair Pulse (bpm): 74 Discharge Destination: Home Respiratory Rate (breaths/min): 16 Transportation: Other Blood Pressure (mmHg): 103/60 Accompanied By: caretaker Schedule Follow-up Appointment: No Clinical Summary of Care: Electronic Signature(s) Signed: 02/03/2021 6:17:43 PM By: Leane Call Entered By: Leane Call on 01/29/2021 16:52:32 -------------------------------------------------------------------------------- Multi Wound Chart Details Patient Name: Date of Service: [name], [name] 01/29/2021 [name]:15 PM Medical Record Number: [id number] Patient Account Number: [id number] Date of Birth/Sex: Treating RN: 31-Mar-1922 (87 y.o. [name] Primary Care Kayle Correa: PA Haig Prophet, NO Other Clinician: Referring Talah Cookston: Treating Dekisha Mesmer/Extender: Frederick Peers in Treatment: 67 Vital Signs Height(in): 71 Pulse(bpm): 74 Weight(lbs): 175 Blood Pressure(mmHg): 103/60 Body Mass Index(BMI): 24 Temperature(F): 98.3 Respiratory Rate(breaths/min): 16 Photos: [[name]:No Photos Sacrum] [N/A:N/A N/A] Wound Location: [[name]:Pressure Injury] [N/A:N/A] Wounding Event: [[name]:Pressure Ulcer] [N/A:N/A] Primary Etiology: [[name]:Glaucoma, Chronic Obstructive] [N/A:N/A] Comorbid History: [[name]:Pulmonary Disease (COPD), Coronary Artery Disease, Hypertension, Type II Diabetes, Osteoarthritis, Dementia 2/[name]/2021] [N/A:N/A] Date Acquired: [[name]:67] [N/A:N/A] Weeks of Treatment: [[name]:Open] [N/A:N/A] Wound Status: [[name]:[name].5x1x1.3] [N/A:N/A] Measurements L x W x D (cm) [[name]:[name].178] [N/A:N/A] A (cm) : rea [[name]:[name].532] [N/A:N/A] Volume (cm) : [[name]:86.50%] [N/A:N/A] % Reduction in A [[name]:rea: 89.70%] [N/A:N/A] % Reduction in Volume: [[name]:12] Starting Position [name] (o'clock): [[name]:12] Ending Position [name] (o'clock): [[name]:2.4] Maximum Distance [name] (cm): [[name]:Yes] [N/A:N/A] Undermining: [[name]:Category/Stage IV] [N/A:N/A] Classification: [[name]:Medium] [N/A:N/A] Exudate A  mount: [[name]:Serosanguineous] [N/A:N/A] Exudate Type: [[name]:red, brown] [N/A:N/A] Exudate Color: [[name]:Well defined, not attached] [N/A:N/A] Wound Margin: [[name]:Large (67-100%)] [N/A:N/A] Granulation A mount: [[name]:Pink] [N/A:N/A] Granulation Quality: [[name]:None Present (0%)] [N/A:N/A] Necrotic A mount: [[name]:Fat Layer (Subcutaneous Tissue): Yes N/A] Exposed Structures: [[name]:Bone: Yes Fascia: No Tendon: No Muscle: No Joint: No Small ([name]-33%)] [N/A:N/A] Epithelialization: [[name]:Debridement - Excisional] [N/A:N/A] Debridement: Pre-procedure Verification/Time Out 14:10 [N/A:N/A] Taken: [[name]:Lidocaine 4% T opical Solution] [N/A:N/A] Pain Control: [[name]:Bone] [N/A:N/A] Tissue Debrided: [[name]:Skin/Subcutaneous] [N/A:N/A] Level: [[name]:Tissue/Muscle/Bone 0.25] [N/A:N/A] Debridement A (sq cm): [[name]:rea Rongeur] [N/A:N/A] Instrument: [[name]:Swab] [N/A:N/A] Specimen: [[name]:2] [N/A:N/A] Number of Specimens Taken: [[name]:Moderate] [N/A:N/A] Bleeding: [[name]:Silver Nitrate] [N/A:N/A]  Hemostasis Achieved: [[name]:0] [N/A:N/A] Procedural Pain: [[name]:0] [N/A:N/A] Post Procedural Pain: Debridement Treatment Response: Procedure was tolerated well [N/A:N/A] Post Debridement Measurements L x [name].5x1x1.3 [N/A:N/A] W x D (cm) [[name]:[name].532] [N/A:N/A] Post Debridement Volume: (cm) [[name]:Category/Stage IV] [N/A:N/A] Post Debridement Stage: [[name]:Debridement] [N/A:N/A] Procedures Performed: Treatment Notes Electronic Signature(s) Signed: 01/29/2021 5:19:12 PM By: Linton Ham MD Signed: 01/29/2021 6:57:58 PM By: Deon Pilling Entered By: Linton Ham on 01/29/2021 14:23:31 -------------------------------------------------------------------------------- Multi-Disciplinary Care Plan Details Patient Name: Date of Service: [name], [name] 01/29/2021 [name]:15 PM Medical Record Number: [id number] Patient Account Number: [id number] Date of Birth/Sex: Treating RN: 1921-12-28 (87 y.o. [name] Primary Care Karsten Vaughn: PA Haig Prophet, NO Other Clinician: Referring  Safira Proffit: Treating Layton [name]/Extender: Frederick Peers in Treatment: 67 [place] reviewed with physician Active Inactive Wound/Skin Impairment Nursing Diagnoses: Impaired tissue [name] Knowledge deficit related to ulceration/compromised skin [name] Goals: Patient/caregiver will verbalize understanding of skin care regimen Date Initiated: 10/17/2019 Target Resolution Date: 04/03/2021 Goal Status: Active Ulcer/skin breakdown will have a volume reduction of 30% by week 4 Date Initiated: 10/17/2019 Date Inactivated: 12/28/2019 Target Resolution Date: 12/28/2019 Goal Status: Unmet Unmet Reason: chronic wound Interventions: Assess patient/caregiver ability to obtain necessary supplies Assess patient/caregiver ability to perform ulcer/skin care regimen upon admission and as needed Assess ulceration(s) every visit Provide education on ulcer and skin care Treatment Activities: Skin care regimen initiated : 10/17/2019 Topical wound management initiated : 10/17/2019 Notes: Electronic Signature(s) Signed: 01/29/2021 6:57:58 PM By: Deon Pilling Entered By: Deon Pilling on 01/29/2021 13:05:52 -------------------------------------------------------------------------------- Pain Assessment Details Patient Name: Date of Service: [name], [name] 01/29/2021 [name]:15 PM Medical Record Number: [id number] Patient Account Number: [id number] Date of Birth/Sex: Treating RN: 1922/01/12 (87 y.o. [name] Primary Care Fernado Brigante: PA Haig Prophet, NO Other Clinician: Referring Cristalle Rohm: Treating Deetya Drouillard/Extender: Frederick Peers in Treatment: 67 Active Problems Location of Pain Severity and Description of Pain Patient Has Paino Patient Unable to Respond Site Locations Pain Management and Medication Current Pain Management: Electronic Signature(s) Signed: 01/29/2021 6:57:58 PM By: Deon Pilling Signed: 7/[name]/2022 10:25:49 AM By: Sandre Kitty Entered By: Sandre Kitty on 01/29/2021 13:42:08 -------------------------------------------------------------------------------- Patient/Caregiver Education Details Patient Name: Date of Service: [name] 6/30/2022andnbsp1:15 PM Medical Record Number: [id number] Patient Account Number: [id number] Date of Birth/Gender: Treating RN: 1921-12-09 (87 y.o. [name] Primary Care Physician: PA Haig Prophet, [place] Other Clinician: Referring Physician: Treating Physician/Extender: Frederick Peers in Treatment: 63 Education Assessment Education Provided To: Caregiver Education Topics Provided Wound/Skin Impairment: Handouts: Skin Care Do's and Dont's Methods: Explain/Verbal Responses: Reinforcements needed Electronic Signature(s) Signed: 01/29/2021 6:57:58 PM By: Deon Pilling Entered By: Deon Pilling on 01/29/2021 13:06:05 -------------------------------------------------------------------------------- Wound Assessment Details Patient Name: Date of Service: [name], [name] 01/29/2021 [name]:15 PM Medical Record Number: [id number] Patient Account Number: [id number] Date of Birth/Sex: Treating RN: 03/21/1922 (87 y.o. [name] Primary Care Karmine Kauer: PA TIENT, NO Other Clinician: Referring Soley Harriss: Treating Kiona Blume/Extender: Frederick Peers in Treatment: 67 Wound Status Wound Number: [name] Primary Pressure Ulcer Etiology: Wound Location: Sacrum Wound Open Wounding Event: Pressure Injury Status: Date Acquired: 2/[name]/2021 Comorbid Glaucoma, Chronic Obstructive Pulmonary Disease (COPD), Weeks Of Treatment: 67 History: Coronary Artery Disease, Hypertension, Type II Diabetes, Clustered Wound: No Osteoarthritis, Dementia Photos Wound Measurements Length: (cm) [name].5 Width: (cm) [name] Depth: (cm) [name].3 Area: (cm) [name].178 Volume: (cm) [name].532 % Reduction in Area: 86.5% % Reduction in Volume: 89.7% Epithelialization: Small  ([name]-33%) Tunneling: No Undermining: Yes Starting Position (o'clock): 12  Ending Position (o'clock): 12 Maximum Distance: (cm) 2.4 Wound Description Classification: Category/Stage IV Wound Margin: Well defined, not attached Exudate Amount: Medium Exudate Type: Serosanguineous Exudate Color: red, brown Foul Odor After Cleansing: No Slough/Fibrino No Wound Bed Granulation Amount: Large (67-100%) Exposed Structure Granulation Quality: Pink Fascia Exposed: No Necrotic Amount: None Present (0%) Fat Layer (Subcutaneous Tissue) Exposed: Yes Tendon Exposed: No Muscle Exposed: No Joint Exposed: No Bone Exposed: Yes Treatment Notes Wound #[name] (Sacrum) Cleanser Normal Saline Discharge Instruction: Cleanse the wound with Normal Saline prior to applying a clean dressing using gauze sponges, not tissue or cotton balls. Peri-Wound Care Topical Primary Dressing Promogran Prisma Matrix, 4.34 (sq in) (silver collagen) Discharge Instruction: moisten with normal saline under wound vac. Wound [name] to apply prisma. Secondary Dressing Woven Gauze Sponge, Non-Sterile 4x4 in Discharge Instruction: saline moisten gauze over Prisma in clinic. ComfortFoam Border, 3x3 in (silicone border) Discharge Instruction: Apply over primary dressing as directed. Secured With Compression Wrap Compression Stockings Environmental education officer) Signed: 01/29/2021 6:57:58 PM By: Deon Pilling Signed: 7/[name]/2022 10:25:49 AM By: Sandre Kitty Entered By: Sandre Kitty on 01/29/2021 16:59:37 -------------------------------------------------------------------------------- Vitals Details Patient Name: Date of Service: [name], [name] 01/29/2021 [name]:15 PM Medical Record Number: [id number] Patient Account Number: [id number] Date of Birth/Sex: Treating RN: 1922/02/13 (87 y.o. [name] Primary Care Rumi Kolodziej: PA TIENT, NO Other Clinician: Referring Amelia Macken: Treating Taniyah Ballow/Extender: Frederick Peers in Treatment: 67 Vital Signs Time Taken: 13:41 Temperature (F): 98.3 Height (in): 71 Pulse (bpm): 74 Weight (lbs): 175 Respiratory Rate (breaths/min): 16 Body Mass Index (BMI): 24.4 Blood Pressure (mmHg): 103/60 Reference Range: 80 - 120 mg / dl Electronic Signature(s) Signed: 7/[name]/2022 10:25:49 AM By: Sandre Kitty Entered By: Sandre Kitty on 01/29/2021 13:41:56

## 2021-02-04 LAB — AEROBIC/ANAEROBIC CULTURE W GRAM STAIN (SURGICAL/DEEP WOUND)

## 2021-02-12 ENCOUNTER — Encounter (HOSPITAL_BASED_OUTPATIENT_CLINIC_OR_DEPARTMENT_OTHER): Payer: Medicare HMO | Admitting: Internal Medicine

## 2021-02-19 ENCOUNTER — Other Ambulatory Visit: Payer: Self-pay

## 2021-02-19 ENCOUNTER — Encounter (HOSPITAL_BASED_OUTPATIENT_CLINIC_OR_DEPARTMENT_OTHER): Payer: Medicare HMO | Attending: Internal Medicine | Admitting: Internal Medicine

## 2021-02-19 DIAGNOSIS — E11621 Type 2 diabetes mellitus with foot ulcer: Secondary | ICD-10-CM | POA: Diagnosis not present

## 2021-02-19 DIAGNOSIS — L97522 Non-pressure chronic ulcer of other part of left foot with fat layer exposed: Secondary | ICD-10-CM | POA: Diagnosis not present

## 2021-02-19 DIAGNOSIS — L97519 Non-pressure chronic ulcer of other part of right foot with unspecified severity: Secondary | ICD-10-CM | POA: Diagnosis not present

## 2021-02-19 DIAGNOSIS — R6889 Other general symptoms and signs: Secondary | ICD-10-CM | POA: Diagnosis not present

## 2021-02-19 DIAGNOSIS — L89154 Pressure ulcer of sacral region, stage 4: Secondary | ICD-10-CM | POA: Diagnosis not present

## 2021-02-19 DIAGNOSIS — E1151 Type 2 diabetes mellitus with diabetic peripheral angiopathy without gangrene: Secondary | ICD-10-CM | POA: Insufficient documentation

## 2021-02-19 NOTE — Progress Notes (Signed)
[name], [name] ([id number]) Visit Report for 02/19/2021 HPI Details Patient Name: Date of Service: [name] [name] 02/19/2021 2:15 PM Medical Record Number: [id number] Patient Account Number: [id number] Date of Birth/Sex: Treating RN: 08-09-1921 (87 y.o. [name] Primary Care Provider: PA Haig Prophet, NO Other Clinician: Referring Provider: Treating Provider/Extender: Frederick Peers in Treatment: 70 History of Present Illness HPI Description: /A3/17/2021 upon evaluation today patient presents for initial inspection here in our clinic concerning issues that has been having with a wound in the sacral region as well as the left heel. Fortunately neither appears to be overtly and significantly infected there is some necrotic tissue noted at both locations however that is going to be required to be addressed. He does have a history of diabetes mellitus type 2, vascular dementia, and hypertension. The patient did have a stroke in November 2020 unfortunately he has not been the same since that time according to his daughter. Prior to that he was taking care of himself now he is 100% dependent. He is at home with his daughter who is present during the office visit today as well that is where I get the majority of the history from at this point. Patient does have a history of hypertension as well which does not appear to be too out of control today which is good news. He does have a hemoglobin A1c of 6.05 May 2019. They currently have been using a calcium alginate dressing to the sacrum and it sounds like Skin-Prep and a foam on the heel. 4/9; pressure ulcers on the sacral area and left heel. Been using Anasept wet-to-dry on the sacrum and Hydrofera Blue in the heel. He has home health family is helping with the dressing. It sounds as though they are fairly religious about offloading these areas that have pressure relief surfaces for the bed. Wounds look better 4/30; I have not  seen this patient in 3 weeks. We have been using Anasept wet-to-dry the sacrum and Hydrofera Blue on the heel. When I saw this 3 weeks ago the sacrum actually look quite good and I thought that we might be able to transition him to a collagen-based dressing today. 5/10; the since the patient was last here he was hospitalized from 5/5 through 12/07/2019. He was noted to have a pressure injury of the sacral region stage IV. MRI did not suggest discitis or osteomyelitis. There was nonspecific bilateral paraspinal muscle edema. Culture I did when he was here the last time showed E. coli I believe they are aware of this. Blood cultures were negative. His white count was 10.8 on admission 11.9 at discharge his comprehensive metabolic panel notable for an albumin of 2.4 BUN of 32 with creatinine of 0.03 hemoglobin was only 7.3 with a discharge hemoglobin of 8.1 after dropping to a hemoglobin of 6.6 He is back at home. Discharge antibiotic was Augmentin.[name] He was treated with IV Rocephin in the hospital. The patient was seen by palliative care I have not looked at this consult. They have been using dressings that they had left over at home I think most recently [facility]. According to his daughter the patient is eating fairly well 12/28/19-Patient is back at 2 weeks, the sacral wound is about the same, the right heel has healed and closed. We are using silver alginate to the sacral wound 6/18; we continue to follow the sacral wound. His heel wounds have closed. We have been using silver alginate to the sacrum 7/1; sacral wound.  We have been using silver collagen with backing wet-to-dry. No open wounds on the heels. 7/15; some improvement in the dimensions of the sacral wound including the undermining area we have been using silver collagen with backing wet-to-dry dressings. We had some thoughts about trying a wound VAC. He has home health. 03/13/20-Sacral wound looks intact with healthy base, surrounding skin  intact, no maceration, he did unfortunately develop to abrasion wounds on his first second and third toes dorsum of the Right foot this happened when his foot got trapped below the wheelchair Footguard 8/30; sacral wound has come down slightly especially the undermining. We have been using a wound VAC She has 2 necrotic areas on the tip of the right first toe and the right second toe dorsally. 10/8 Is been about 5 weeks since I have seen this man. Family tells me that the sacrum really deteriorated over the last week or 2. The blame home health for some back issues. Dimensions here are a lot larger than last time including the undermining and there is now exposed bone. He had an MRI done in May when he was hospitalized at this area that did not show osteomyelitis nevertheless there is been a fairly significant deterioration since I last saw this wound. He has small abrasions on the right dorsal second and third toes which look as though they are progressing towards closure 10/22; the patient came in 2 weeks ago with exposed bone. Sample showed osteomyelitis culture Enterococcus. Given his frailty and the state of his dementia I elected to try to treat him with oral Augmentin currently on 500 3 times daily. I am going to try to keep him on this for 6 weeks. He is eating and drinking well taking the antibiotics well. So far no overt side effects 11/12; unfortunately the patient was away from clinic longer than I thought he was going to be in they ran out of Augmentin about a week ago. I'm going to renew this but reduce the dose somewhat to twice a day. His wound looks better we've been using silver alginate. No exposed bone today. 12/7; the patient is finished Augmentin had roughly 28 days. For some reason pharmacy will not fill it 2-week supply of liquid Augmentin, in any case I am finished giving him antibiotics. This was for the bone specimen that showed Enterococcus he has been on this for at least 6  weeks. Some improvement in wound area. There is still some exposed bone superiorly in the swollen 08/12/2020; 1 month follow-up. We treated him for underlying osteomyelitis. He is using silver collagen backing wet-to-dry. Advanced dementia precludes use of a wound VAC. I did talk with his daughter today about advanced treatment products but she is having to change the dressing too frequently during the week to make this possible. He is not a candidate for plastic surgery therefore I really think that the collagen backing wet-to- dry is the best option here. He has 1.1 cm in direct depth and 1.9 cm and undermining from 11-6 o'clock. 2/11; 1 month follow-up. The wound looks essentially the same. This does not probe to bone the base of it seems to have healthy granulation but in terms of the overall wound volume undermining I think this is all about the same. I went over the options last visit with his daughter which would include an advanced treatment product necessitating more frequent visits. Because of his dementia and agitation I do not think he is a candidate for wound VAC or  plastic surgery 3/17; 1 month follow-up. The wound looks generally the same. Lower sacrum healthy granulation no exposed bone minimal amount of undermining. He is family reminds me that we did try wound VAC back in the early part of the fall last year however they are wondering whether we could try it again. Looking back over our records this was interrupted by exposed bone and probable osteomyelitis. I treated him with at least 4 weeks of oral Augmentin. I did not think we can get him through an MRI. 4/28; 1 month follow-up. Patient is currently using a wound VAC. Patient's daughter is present. There are no issues or complaints today. 6/30; its been 2 months since the patient was in the clinic. He is still using silver collagen on the wound bed under wound VAC. He arrives in clinic today with the dimensions better including,  however and right in the middle of this wide open area of exposed bone. There is no obvious infection. This is not the first time this is happening. I treated him previously with 4 weeks of oral Augmentin. 7/21; we had some difficulty getting this patient back in. Bone biopsy I did last time showed chronic osteomyelitis. Culture of the bone showed Enterococcus faecalis. I started him on Augmentin on 02/04/2021 2 weeks worth. The daughter crushes the tablets. I am going to give him an additional 2 weeks with 1 renewal. He seems to be tolerating this well The patient has advanced dementia. It is difficult for them to transport him into our clinic. I do not think he is a candidate for a wound VAC. It is difficult to see setting him up for IV antibiotics although certainly a long-acting antibiotic such as Dalvance might be possible if this deteriorates again. I have talked to the daughter today about the likely palliative nature of this wound Electronic Signature(s) Signed: 02/19/2021 5:24:41 PM By: Linton Ham MD Entered By: Linton Ham on 02/19/2021 15:25:00 -------------------------------------------------------------------------------- Physical Exam Details Patient Name: Date of Service: [facility] WALLY, SHEVCHENKO 02/19/2021 2:15 PM Medical Record Number: [id number] Patient Account Number: [id number] Date of Birth/Sex: Treating RN: 03/21/1922 (87 y.o. [name] Primary Care Provider: PA Haig Prophet, NO Other Clinician: Referring Provider: Treating Provider/Extender: Frederick Peers in Treatment: 70 Constitutional Sitting or standing Blood Pressure is within target range for patient.. Pulse regular and within target range for patient.[name] Respirations regular, non-labored and within target range.. Temperature is normal and within the target range for the patient.[name] Appears in no distress. Notes Wound exam; sacral decubitus ulcer. Surprisingly there is no open bone here. Granulation  looks really healthy. There is circumferential undermining. The wound volume is measuring larger Electronic Signature(s) Signed: 02/19/2021 5:24:41 PM By: Linton Ham MD Entered By: Linton Ham on 02/19/2021 15:26:11 -------------------------------------------------------------------------------- Physician Orders Details Patient Name: Date of Service: [facility] PEARSE, SHIFFLER 02/19/2021 2:15 PM Medical Record Number: [id number] Patient Account Number: [id number] Date of Birth/Sex: Treating RN: 11-30-1921 (87 y.o. [name] Primary Care Provider: PA Haig Prophet, NO Other Clinician: Referring Provider: Treating Provider/Extender: Frederick Peers in Treatment: 67 Verbal / Phone Orders: No Diagnosis Coding ICD-10 Coding Code Description L89.154 Pressure ulcer of sacral region, stage 4 M86.18 Other acute osteomyelitis, other site Follow-up Appointments Return appointment in 1 month. - Dr. Dellia Nims ***extra time [facility] lift*** 60 minutes Call if any concerns or questions. Off-Loading Low air-loss mattress (Group 2) Turn and reposition every 2 hours Home Health New wound care orders this week; continue Home Health  for wound care. May utilize formulary equivalent dressing for wound treatment orders unless otherwise specified. - Advance home health 1-2 times a week. Apply silver collagen with wet to dry backing cover with abd pad. Wound Treatment Wound #1 - Sacrum Cleanser: Wound Cleanser (Home Health) 3 x Per Week/30 Days Discharge Instructions: Cleanse the wound with wound cleanser prior to applying a clean dressing using gauze sponges, not tissue or cotton balls. Prim Dressing: Promogran Prisma Matrix, 4.34 (sq in) (silver collagen) (Home Health) 3 x Per Week/30 Days ary Discharge Instructions: Moisten collagen with saline or hydrogel Prim Dressing: wet to dry gauze (Home Health) 3 x Per Week/30 Days ary Discharge Instructions: backing the prisma with saline moisten  gauze. Secondary Dressing: ABD Pad, 8x10 (Home Health) 3 x Per Week/30 Days Discharge Instructions: Apply over primary dressing as directed. Secured With: 29M Medipore H Soft Cloth Surgical T 4 x 2 (in/yd) (Home Health) 3 x Per Week/30 Days ape Discharge Instructions: Secure dressing with tape as directed. Patient Medications llergies: lisinopril A Notifications Medication Indication Start End sacral osteomyelitis 02/19/2021 Augmentin DOSE oral 875 mg-125 mg tablet - 1 tablet oral bid for 2 weeks Electronic Signature(s) Signed: 02/19/2021 3:29:50 PM By: Linton Ham MD Entered By: Linton Ham on 02/19/2021 15:29:49 -------------------------------------------------------------------------------- Problem List Details Patient Name: Date of Service: [facility] NTHONY, LEFFERTS 02/19/2021 2:15 PM Medical Record Number: [id number] Patient Account Number: [id number] Date of Birth/Sex: Treating RN: 1921/09/15 (87 y.o. [name] Primary Care Provider: PA Haig Prophet, NO Other Clinician: Referring Provider: Treating Provider/Extender: Frederick Peers in Treatment: 70 Active Problems ICD-10 Encounter Code Description Active Date MDM Diagnosis L89.154 Pressure ulcer of sacral region, stage 4 10/17/2019 No Yes M86.18 Other acute osteomyelitis, other site 05/23/2020 No Yes Inactive Problems ICD-10 Code Description Active Date Inactive Date E11.621 Type 2 diabetes mellitus with foot ulcer 10/17/2019 10/17/2019 L97.522 Non-pressure chronic ulcer of other part of left foot with fat layer exposed 10/17/2019 10/17/2019 F01.50 Vascular dementia without behavioral disturbance 10/17/2019 10/17/2019 I10 Essential (primary) hypertension 10/17/2019 10/17/2019 L97.518 Non-pressure chronic ulcer of other part of right foot with other specified severity 03/31/2020 03/31/2020 L03.031 Cellulitis of right toe 03/31/2020 03/31/2020 Resolved Problems Electronic Signature(s) Signed: 02/19/2021 5:24:41 PM  By: Linton Ham MD Entered By: Linton Ham on 02/19/2021 15:19:50 -------------------------------------------------------------------------------- Progress Note Details Patient Name: Date of Service: [facility] OSHUA, MCCONAHA 02/19/2021 2:15 PM Medical Record Number: [id number] Patient Account Number: [id number] Date of Birth/Sex: Treating RN: 1922-07-26 (87 y.o. [name] Primary Care Provider: PA Haig Prophet, NO Other Clinician: Referring Provider: Treating Provider/Extender: Frederick Peers in Treatment: 70 Subjective History of Present Illness (HPI) /A3/17/2021 upon evaluation today patient presents for initial inspection here in our clinic concerning issues that has been having with a wound in the sacral region as well as the left heel. Fortunately neither appears to be overtly and significantly infected there is some necrotic tissue noted at both locations however that is going to be required to be addressed. He does have a history of diabetes mellitus type 2, vascular dementia, and hypertension. The patient did have a stroke in November 2020 unfortunately he has not been the same since that time according to his daughter. Prior to that he was taking care of himself now he is 100% dependent. He is at home with his daughter who is present during the office visit today as well that is where I get the majority of the history from at this point. Patient does have  a history of hypertension as well which does not appear to be too out of control today which is good news. He does have a hemoglobin A1c of 6.05 May 2019. They currently have been using a calcium alginate dressing to the sacrum and it sounds like Skin-Prep and a foam on the heel. 4/9; pressure ulcers on the sacral area and left heel. Been using Anasept wet-to-dry on the sacrum and Hydrofera Blue in the heel. He has home health family is helping with the dressing. It sounds as though they are fairly religious  about offloading these areas that have pressure relief surfaces for the bed. Wounds look better 4/30; I have not seen this patient in 3 weeks. We have been using Anasept wet-to-dry the sacrum and Hydrofera Blue on the heel. When I saw this 3 weeks ago the sacrum actually look quite good and I thought that we might be able to transition him to a collagen-based dressing today. 5/10; the since the patient was last here he was hospitalized from 5/5 through 12/07/2019. He was noted to have a pressure injury of the sacral region stage IV. MRI did not suggest discitis or osteomyelitis. There was nonspecific bilateral paraspinal muscle edema. Culture I did when he was here the last time showed E. coli I believe they are aware of this. Blood cultures were negative. His white count was 10.8 on admission 11.9 at discharge his comprehensive metabolic panel notable for an albumin of 2.4 BUN of 32 with creatinine of 0.03 hemoglobin was only 7.3 with a discharge hemoglobin of 8.1 after dropping to a hemoglobin of 6.6 He is back at home. Discharge antibiotic was Augmentin.[name] He was treated with IV Rocephin in the hospital. The patient was seen by palliative care I have not looked at this consult. They have been using dressings that they had left over at home I think most recently [facility]. According to his daughter the patient is eating fairly well 12/28/19-Patient is back at 2 weeks, the sacral wound is about the same, the right heel has healed and closed. We are using silver alginate to the sacral wound 6/18; we continue to follow the sacral wound. His heel wounds have closed. We have been using silver alginate to the sacrum 7/1; sacral wound. We have been using silver collagen with backing wet-to-dry. No open wounds on the heels. 7/15; some improvement in the dimensions of the sacral wound including the undermining area we have been using silver collagen with backing wet-to-dry dressings. We had some thoughts  about trying a wound VAC. He has home health. 03/13/20-Sacral wound looks intact with healthy base, surrounding skin intact, no maceration, he did unfortunately develop to abrasion wounds on his first second and third toes dorsum of the Right foot this happened when his foot got trapped below the wheelchair Footguard 8/30; sacral wound has come down slightly especially the undermining. We have been using a wound VAC ooShe has 2 necrotic areas on the tip of the right first toe and the right second toe dorsally. 10/8 Is been about 5 weeks since I have seen this man. Family tells me that the sacrum really deteriorated over the last week or 2. The blame home health for some back issues. Dimensions here are a lot larger than last time including the undermining and there is now exposed bone. He had an MRI done in May when he was hospitalized at this area that did not show osteomyelitis nevertheless there is been a fairly significant deterioration since  I last saw this wound. He has small abrasions on the right dorsal second and third toes which look as though they are progressing towards closure 10/22; the patient came in 2 weeks ago with exposed bone. Sample showed osteomyelitis culture Enterococcus. Given his frailty and the state of his dementia I elected to try to treat him with oral Augmentin currently on 500 3 times daily. I am going to try to keep him on this for 6 weeks. He is eating and drinking well taking the antibiotics well. So far no overt side effects 11/12; unfortunately the patient was away from clinic longer than I thought he was going to be in they ran out of Augmentin about a week ago. I'm going to renew this but reduce the dose somewhat to twice a day. His wound looks better we've been using silver alginate. No exposed bone today. 12/7; the patient is finished Augmentin had roughly 28 days. For some reason pharmacy will not fill it 2-week supply of liquid Augmentin, in any case I  am finished giving him antibiotics. This was for the bone specimen that showed Enterococcus he has been on this for at least 6 weeks. Some improvement in wound area. There is still some exposed bone superiorly in the swollen 08/12/2020; 1 month follow-up. We treated him for underlying osteomyelitis. He is using silver collagen backing wet-to-dry. Advanced dementia precludes use of a wound VAC. I did talk with his daughter today about advanced treatment products but she is having to change the dressing too frequently during the week to make this possible. He is not a candidate for plastic surgery therefore I really think that the collagen backing wet-to- dry is the best option here. He has 1.1 cm in direct depth and 1.9 cm and undermining from 11-6 o'clock. 2/11; 1 month follow-up. The wound looks essentially the same. This does not probe to bone the base of it seems to have healthy granulation but in terms of the overall wound volume undermining I think this is all about the same. I went over the options last visit with his daughter which would include an advanced treatment product necessitating more frequent visits. Because of his dementia and agitation I do not think he is a candidate for wound VAC or plastic surgery 3/17; 1 month follow-up. The wound looks generally the same. Lower sacrum healthy granulation no exposed bone minimal amount of undermining. He is family reminds me that we did try wound VAC back in the early part of the fall last year however they are wondering whether we could try it again. Looking back over our records this was interrupted by exposed bone and probable osteomyelitis. I treated him with at least 4 weeks of oral Augmentin. I did not think we can get him through an MRI. 4/28; 1 month follow-up. Patient is currently using a wound VAC. Patient's daughter is present. There are no issues or complaints today. 6/30; its been 2 months since the patient was in the clinic. He is  still using silver collagen on the wound bed under wound VAC. He arrives in clinic today with the dimensions better including, however and right in the middle of this wide open area of exposed bone. There is no obvious infection. This is not the first time this is happening. I treated him previously with 4 weeks of oral Augmentin. 7/21; we had some difficulty getting this patient back in. Bone biopsy I did last time showed chronic osteomyelitis. Culture of the bone showed Enterococcus  faecalis. I started him on Augmentin on 02/04/2021 2 weeks worth. The daughter crushes the tablets. I am going to give him an additional 2 weeks with 1 renewal. He seems to be tolerating this well The patient has advanced dementia. It is difficult for them to transport him into our clinic. I do not think he is a candidate for a wound VAC. It is difficult to see setting him up for IV antibiotics although certainly a long-acting antibiotic such as Dalvance might be possible if this deteriorates again. I have talked to the daughter today about the likely palliative nature of this wound Objective Constitutional Sitting or standing Blood Pressure is within target range for patient.. Pulse regular and within target range for patient.[name] Respirations regular, non-labored and within target range.. Temperature is normal and within the target range for the patient.[name] Appears in no distress. Vitals Time Taken: 2:13 PM, Height: 71 in, Weight: 175 lbs, BMI: 24.4, Temperature: 98.2 F, Pulse: 77 bpm, Respiratory Rate: 16 breaths/min, Blood Pressure: 109/62 mmHg. General Notes: Wound exam; sacral decubitus ulcer. Surprisingly there is no open bone here. Granulation looks really healthy. There is circumferential undermining. The wound volume is measuring larger Integumentary (Hair, Skin) Wound #1 status is Open. Original cause of wound was Pressure Injury. The date acquired was: 09/03/2019. The wound has been in treatment 70 weeks.  The wound is located on the Sacrum. The wound measures 2cm length x 1.8cm width x 1.7cm depth; 2.827cm^2 area and 4.807cm^3 volume. There is bone and Fat Layer (Subcutaneous Tissue) exposed. There is no tunneling noted, however, there is undermining starting at 12:00 and ending at 4:00 with a maximum distance of 2.7cm. There is a medium amount of serosanguineous drainage noted. The wound margin is well defined and not attached to the wound base. There is large (67-100%) pink granulation within the wound bed. There is a small (1-33%) amount of necrotic tissue within the wound bed including Adherent Slough. Assessment Active Problems ICD-10 Pressure ulcer of sacral region, stage 4 Other acute osteomyelitis, other site Plan Follow-up Appointments: Return appointment in 1 month. - Dr. Dellia Nims ***extra time [facility] lift*** 60 minutes Call if any concerns or questions. Off-Loading: Low air-loss mattress (Group 2) Turn and reposition every 2 hours Home Health: New wound care orders this week; continue Home Health for wound care. May utilize formulary equivalent dressing for wound treatment orders unless otherwise specified. - [facility] home health 1-2 times a week. Apply silver collagen with wet to dry backing cover with abd pad. The following medication(s) was prescribed: Augmentin oral 875 mg-125 mg tablet 1 tablet oral bid for 2 weeks for sacral osteomyelitis starting 02/19/2021 WOUND #1: - Sacrum Wound Laterality: Cleanser: Wound Cleanser (Home Health) 3 x Per Week/30 Days Discharge Instructions: Cleanse the wound with wound cleanser prior to applying a clean dressing using gauze sponges, not tissue or cotton balls. Prim Dressing: Promogran Prisma Matrix, 4.34 (sq in) (silver collagen) (Home Health) 3 x Per Week/30 Days ary Discharge Instructions: Moisten collagen with saline or hydrogel Prim Dressing: wet to dry gauze (Home Health) 3 x Per Week/30 Days ary Discharge Instructions: backing the  prisma with saline moisten gauze. Secondary Dressing: ABD Pad, 8x10 (Home Health) 3 x Per Week/30 Days Discharge Instructions: Apply over primary dressing as directed. Secured With: 60M Medipore H Soft Cloth Surgical T 4 x 2 (in/yd) (Home Health) 3 x Per Week/30 Days ape Discharge Instructions: Secure dressing with tape as directed. 1. I continued with the silver collagen-based dressings 2.  I am going to add additional Augmentin for 2 weeks with 1 renewal 3. It is difficult for me to see him in infectious disease getting a PICC line. A single infusion of a long-acting medication against gram-positive such as Dalvance might be possible 4. He has no exposed bone here today which is an improvement however this is a second round of underlying chronic osteomyelitis. I do not expect this to close Electronic Signature(s) Signed: 02/19/2021 3:30:10 PM By: Linton Ham MD Entered By: Linton Ham on 02/19/2021 15:30:10 -------------------------------------------------------------------------------- SuperBill Details Patient Name: Date of Service: [facility] JAYSIAH, MARCHETTA 02/19/2021 Medical Record Number: [id number] Patient Account Number: [id number] Date of Birth/Sex: Treating RN: 12-Dec-1921 (87 y.o. [name] Primary Care Provider: PA Haig Prophet, NO Other Clinician: Referring Provider: Treating Provider/Extender: Frederick Peers in Treatment: 70 Diagnosis Coding ICD-10 Codes Code Description L89.154 Pressure ulcer of sacral region, stage 4 M86.18 Other acute osteomyelitis, other site Facility Procedures CPT4 Code: [id number] Description: 99214 - WOUND CARE VISIT-LEV 4 EST PT Modifier: Quantity: 1 Physician Procedures : CPT4 Code Description Modifier [id number] - WC PHYS LEVEL 4 - EST PT ICD-10 Diagnosis Description L89.154 Pressure ulcer of sacral region, stage 4 M86.18 Other acute osteomyelitis, other site Quantity: 1 Electronic Signature(s) Signed: 02/19/2021  5:24:41 PM By: Linton Ham MD Signed: 02/19/2021 6:31:47 PM By: Deon Pilling Entered By: Deon Pilling on 02/19/2021 17:17:30

## 2021-02-23 NOTE — Progress Notes (Signed)
[name], [name] ([id number]) Visit Report for 02/19/2021 Arrival Information Details Patient Name: Date of Service: [name], [name] 02/19/2021 2:15 PM Medical Record Number: [id number] Patient Account Number: [id number] Date of Birth/Sex: Treating RN: 1921-10-02 (87 y.o. [name] Primary Care Fue Cervenka: PA Haig Prophet, NO Other Clinician: Referring Cavan Bearden: Treating Camarion Weier/Extender: Frederick Peers in Treatment: 70 Visit Information History Since Last Visit Added or deleted any medications: No Patient Arrived: Wheel Chair Any new allergies or adverse reactions: No Arrival Time: 14:13 Had a fall or experienced change in No Accompanied By: daughter activities of daily living that may affect Transfer Assistance: Harrel Lemon Lift risk of falls: Patient Identification Verified: Yes Signs or symptoms of abuse/neglect since last visito No Secondary Verification Process Completed: Yes Hospitalized since last visit: No Patient Requires Transmission-Based Precautions: No Implantable device outside of the clinic excluding No Patient Has Alerts: No cellular tissue based products placed in the center since last visit: Has Dressing in Place as Prescribed: Yes Pain Present Now: No Electronic Signature(s) Signed: 02/23/2021 4:44:07 PM By: Levan Hurst RN, BSN Entered By: Levan Hurst on 02/19/2021 14:27:37 -------------------------------------------------------------------------------- Clinic Level of Care Assessment Details Patient Name: Date of Service: [facility] [name], [name] 02/19/2021 2:15 PM Medical Record Number: [id number] Patient Account Number: [id number] Date of Birth/Sex: Treating RN: [birth year] (87 y.o. [name] Primary Care Eliyas Suddreth: PA Haig Prophet, NO Other Clinician: Referring Trella [name]: Treating Zaniah Titterington/Extender: Frederick Peers in Treatment: 70 Clinic Level of Care Assessment Items TOOL 4 Quantity Score X- 1 0 Use when only an EandM is  performed on FOLLOW-UP visit ASSESSMENTS - Nursing Assessment / Reassessment X- 1 10 Reassessment of Co-morbidities (includes updates in patient status) X- 1 5 Reassessment of Adherence to Treatment Plan ASSESSMENTS - Wound and Skin A ssessment / Reassessment '[]'$  - 0 Simple Wound Assessment / Reassessment - one wound X- 1 5 Complex Wound Assessment / Reassessment - multiple wounds X- 1 10 Dermatologic / Skin Assessment (not related to wound area) ASSESSMENTS - Focused Assessment '[]'$  - 0 Circumferential Edema Measurements - multi extremities X- 1 10 Nutritional Assessment / Counseling / Intervention '[]'$  - 0 Lower Extremity Assessment (monofilament, tuning fork, pulses) '[]'$  - 0 Peripheral Arterial Disease Assessment (using hand held doppler) ASSESSMENTS - Ostomy and/or Continence Assessment and Care '[]'$  - 0 Incontinence Assessment and Management '[]'$  - 0 Ostomy Care Assessment and Management (repouching, etc.) PROCESS - Coordination of Care '[]'$  - 0 Simple Patient / Family Education for ongoing care X- 1 20 Complex (extensive) Patient / Family Education for ongoing care X- 1 10 Staff obtains Programmer, systems, Records, T Results / Process Orders est X- 1 10 Staff telephones HHA, Nursing Homes / Clarify orders / etc '[]'$  - 0 Routine Transfer to another Facility (non-emergent condition) '[]'$  - 0 Routine Hospital Admission (non-emergent condition) '[]'$  - 0 New Admissions / Biomedical engineer / Ordering NPWT Apligraf, etc. , '[]'$  - 0 Emergency Hospital Admission (emergent condition) '[]'$  - 0 Simple Discharge Coordination X- 1 15 Complex (extensive) Discharge Coordination PROCESS - Special Needs '[]'$  - 0 Pediatric / Minor Patient Management '[]'$  - 0 Isolation Patient Management '[]'$  - 0 Hearing / Language / Visual special needs '[]'$  - 0 Assessment of Community assistance (transportation, D/C planning, etc.) '[]'$  - 0 Additional assistance / Altered mentation '[]'$  - 0 Support Surface(s) Assessment  (bed, cushion, seat, etc.) INTERVENTIONS - Wound Cleansing / Measurement '[]'$  - 0 Simple Wound Cleansing - one wound X- 1 5 Complex Wound Cleansing - multiple wounds  X- 1 5 Wound Imaging (photographs - any number of wounds) '[]'$  - 0 Wound Tracing (instead of photographs) '[]'$  - 0 Simple Wound Measurement - one wound X- 1 5 Complex Wound Measurement - multiple wounds INTERVENTIONS - Wound Dressings X - Small Wound Dressing one or multiple wounds 1 10 '[]'$  - 0 Medium Wound Dressing one or multiple wounds '[]'$  - 0 Large Wound Dressing one or multiple wounds '[]'$  - 0 Application of Medications - topical '[]'$  - 0 Application of Medications - injection INTERVENTIONS - Miscellaneous '[]'$  - 0 External ear exam '[]'$  - 0 Specimen Collection (cultures, biopsies, blood, body fluids, etc.) '[]'$  - 0 Specimen(s) / Culture(s) sent or taken to Lab for analysis '[]'$  - 0 Patient Transfer (multiple staff / Civil Service fast streamer / Similar devices) '[]'$  - 0 Simple Staple / Suture removal (25 or less) '[]'$  - 0 Complex Staple / Suture removal (26 or more) '[]'$  - 0 Hypo / Hyperglycemic Management (close monitor of Blood Glucose) '[]'$  - 0 Ankle / Brachial Index (ABI) - do not check if billed separately X- 1 5 Vital Signs Has the patient been seen at the hospital within the last three years: Yes Total Score: 125 Level Of Care: New/Established - Level 4 Electronic Signature(s) Signed: 02/19/2021 6:31:47 PM By: Deon Pilling Entered By: Deon Pilling on 02/19/2021 17:17:24 -------------------------------------------------------------------------------- Encounter Discharge Information Details Patient Name: Date of Service: [facility] [name], [name] 02/19/2021 2:15 PM Medical Record Number: [id number] Patient Account Number: [id number] Date of Birth/Sex: Treating RN: 12-Apr-1922 (87 y.o. [name] Primary Care Cassandr Cederberg: PA Haig Prophet, NO Other Clinician: Referring Seaver Machia: Treating Kendria Halberg/Extender: Frederick Peers in  Treatment: 44 Encounter Discharge Information Items Discharge Condition: Stable Ambulatory Status: Wheelchair Discharge Destination: Home Transportation: Private Auto Accompanied By: caregiver Schedule Follow-up Appointment: Yes Clinical Summary of Care: Patient Declined Electronic Signature(s) Signed: 02/23/2021 4:44:07 PM By: Levan Hurst RN, BSN Entered By: Levan Hurst on 02/19/2021 18:20:14 -------------------------------------------------------------------------------- Multi Wound Chart Details Patient Name: Date of Service: [facility] [name], [name] 02/19/2021 2:15 PM Medical Record Number: [id number] Patient Account Number: [id number] Date of Birth/Sex: Treating RN: 1921-09-24 (87 y.o. [name] Primary Care Jaydn Moscato: PA Haig Prophet, NO Other Clinician: Referring Kahlel Peake: Treating Cornel Werber/Extender: Frederick Peers in Treatment: 70 Vital Signs Height(in): 71 Pulse(bpm): 77 Weight(lbs): 175 Blood Pressure(mmHg): 109/62 Body Mass Index(BMI): 24 Temperature(F): 98.2 Respiratory Rate(breaths/min): 16 Photos: [1:No Photos Sacrum] [N/A:N/A N/A] Wound Location: [1:Pressure Injury] [N/A:N/A] Wounding Event: [1:Pressure Ulcer] [N/A:N/A] Primary Etiology: [1:Glaucoma, Chronic Obstructive] [N/A:N/A] Comorbid History: [1:Pulmonary Disease (COPD), Coronary Artery Disease, Hypertension, Type II Diabetes, Osteoarthritis, Dementia 09/03/2019] [N/A:N/A] Date Acquired: [1:70] [N/A:N/A] Weeks of Treatment: [1:Open] [N/A:N/A] Wound Status: [1:2x1.8x1.7] [N/A:N/A] Measurements L x W x D (cm) [1:2.827] [N/A:N/A] A (cm) : rea [1:4.807] [N/A:N/A] Volume (cm) : [1:67.60%] [N/A:N/A] % Reduction in A rea: [1:67.60%] [N/A:N/A] % Reduction in Volume: [1:12] Starting Position 1 (o'clock): [1:4] Ending Position 1 (o'clock): [1:2.7] Maximum Distance 1 (cm): [1:Yes] [N/A:N/A] Undermining: [1:Category/Stage IV] [N/A:N/A] Classification: [1:Medium] [N/A:N/A] Exudate A mount:  [1:Serosanguineous] [N/A:N/A] Exudate Type: [1:red, brown] [N/A:N/A] Exudate Color: [1:Well defined, not attached] [N/A:N/A] Wound Margin: [1:Large (67-100%)] [N/A:N/A] Granulation A mount: [1:Pink] [N/A:N/A] Granulation Quality: [1:Small (1-33%)] [N/A:N/A] Necrotic A mount: [1:Fat Layer (Subcutaneous Tissue): Yes N/A] Exposed Structures: [1:Bone: Yes Fascia: No Tendon: No Muscle: No Joint: No Small (1-33%)] [N/A:N/A] Treatment Notes Electronic Signature(s) Signed: 02/19/2021 5:24:41 PM By: Linton Ham MD Signed: 02/19/2021 6:31:47 PM By: Deon Pilling Entered By: Linton Ham on 02/19/2021 15:19:58 -------------------------------------------------------------------------------- Multi-Disciplinary Care  Plan Details Patient Name: Date of Service: [name], [name] 02/19/2021 2:15 PM Medical Record Number: [id number] Patient Account Number: [id number] Date of Birth/Sex: Treating RN: [birth year] (87 y.o. [name] Primary Care Alianis Trimmer: PA Haig Prophet, NO Other Clinician: Referring Fonda Rochon: Treating Christelle Igoe/Extender: Frederick Peers in Treatment: 70 Multidisciplinary Care Plan reviewed with physician Active Inactive Wound/Skin Impairment Nursing Diagnoses: Impaired tissue integrity Knowledge deficit related to ulceration/compromised skin integrity Goals: Patient/caregiver will verbalize understanding of skin care regimen Date Initiated: 10/17/2019 Target Resolution Date: 04/03/2021 Goal Status: Active Ulcer/skin breakdown will have a volume reduction of 30% by week 4 Date Initiated: 10/17/2019 Date Inactivated: 12/28/2019 Target Resolution Date: 12/28/2019 Goal Status: Unmet Unmet Reason: chronic wound Interventions: Assess patient/caregiver ability to obtain necessary supplies Assess patient/caregiver ability to perform ulcer/skin care regimen upon admission and as needed Assess ulceration(s) every visit Provide education on ulcer and skin  care Treatment Activities: Skin care regimen initiated : 10/17/2019 Topical wound management initiated : 10/17/2019 Notes: Electronic Signature(s) Signed: 02/19/2021 6:31:47 PM By: Deon Pilling Entered By: Deon Pilling on 02/19/2021 14:49:43 -------------------------------------------------------------------------------- Pain Assessment Details Patient Name: Date of Service: [name], [name] 02/19/2021 2:15 PM Medical Record Number: [id number] Patient Account Number: [id number] Date of Birth/Sex: Treating RN: 03/18/1922 (87 y.o. [name] Primary Care Lachlan Pelto: PA Haig Prophet, NO Other Clinician: Referring Otto Caraway: Treating Magin Balbi/Extender: Frederick Peers in Treatment: 70 Active Problems Location of Pain Severity and Description of Pain Patient Has Paino No Site Locations Pain Management and Medication Current Pain Management: Electronic Signature(s) Signed: 02/23/2021 4:44:07 PM By: Levan Hurst RN, BSN Entered By: Levan Hurst on 02/19/2021 14:27:59 -------------------------------------------------------------------------------- Patient/Caregiver Education Details Patient Name: Date of Service: [facility] 7/21/2022andnbsp2:15 PM Medical Record Number: [id number] Patient Account Number: [id number] Date of Birth/Gender: Treating RN: 1922-02-21 (87 y.o. [name] Primary Care Physician: PA Haig Prophet, [place] Other Clinician: Referring Physician: Treating Physician/Extender: Frederick Peers in Treatment: 70 Education Assessment Education Provided To: Patient Education Topics Provided Infection: Handouts: Infection Prevention and Management Methods: Explain/Verbal Responses: Reinforcements needed Electronic Signature(s) Signed: 02/19/2021 6:31:47 PM By: Deon Pilling Entered By: Deon Pilling on 02/19/2021 14:51:16 -------------------------------------------------------------------------------- Wound Assessment  Details Patient Name: Date of Service: [name], [name] 02/19/2021 2:15 PM Medical Record Number: [id number] Patient Account Number: [id number] Date of Birth/Sex: Treating RN: 26-Jan-1922 (87 y.o. [name] Primary Care Yulisa Chirico: PA TIENT, NO Other Clinician: Referring Myrle Wanek: Treating Hadleigh Felber/Extender: Frederick Peers in Treatment: 17 Wound Status Wound Number: 1 Primary Pressure Ulcer Etiology: Wound Location: Sacrum Wound Open Wounding Event: Pressure Injury Status: Date Acquired: 09/03/2019 Comorbid Glaucoma, Chronic Obstructive Pulmonary Disease (COPD), Weeks Of Treatment: 70 History: Coronary Artery Disease, Hypertension, Type II Diabetes, Clustered Wound: No Osteoarthritis, Dementia Photos Photo Uploaded By: Donavan Burnet on 02/20/2021 08:39:47 Wound Measurements Length: (cm) 2 Width: (cm) 1.8 Depth: (cm) 1.7 Area: (cm) 2.827 Volume: (cm) 4.807 % Reduction in Area: 67.6% % Reduction in Volume: 67.6% Epithelialization: Small (1-33%) Tunneling: No Undermining: Yes Starting Position (o'clock): 12 Ending Position (o'clock): 4 Maximum Distance: (cm) 2.7 Wound Description Classification: Category/Stage IV Wound Margin: Well defined, not attached Exudate Amount: Medium Exudate Type: Serosanguineous Exudate Color: red, brown Foul Odor After Cleansing: No Slough/Fibrino Yes Wound Bed Granulation Amount: Large (67-100%) Exposed Structure Granulation Quality: Pink Fascia Exposed: No Necrotic Amount: Small (1-33%) Fat Layer (Subcutaneous Tissue) Exposed: Yes Necrotic Quality: Adherent Slough Tendon Exposed: No Muscle Exposed: No Joint Exposed: No Bone Exposed: Yes Treatment Notes  Wound #1 (Sacrum) Cleanser Wound Cleanser Discharge Instruction: Cleanse the wound with wound cleanser prior to applying a clean dressing using gauze sponges, not tissue or cotton balls. Peri-Wound Care Topical Primary Dressing Promogran Prisma  Matrix, 4.34 (sq in) (silver collagen) Discharge Instruction: Moisten collagen with saline or hydrogel wet to dry gauze Discharge Instruction: backing the prisma with saline moisten gauze. Secondary Dressing ABD Pad, 8x10 Discharge Instruction: Apply over primary dressing as directed. Secured With 68M Medipore H Soft Cloth Surgical T 4 x 2 (in/yd) ape Discharge Instruction: Secure dressing with tape as directed. Compression Wrap Compression Stockings Add-Ons Electronic Signature(s) Signed: 02/23/2021 4:44:07 PM By: Levan Hurst RN, BSN Entered By: Levan Hurst on 02/19/2021 14:28:39 -------------------------------------------------------------------------------- Vitals Details Patient Name: Date of Service: [address] [name], [name] 02/19/2021 2:15 PM Medical Record Number: [id number] Patient Account Number: [id number] Date of Birth/Sex: Treating RN: 23-Jul-1922 (87 y.o. [name] Primary Care Mirza Kidney: PA TIENT, NO Other Clinician: Referring Amin Fornwalt: Treating Meric Joye/Extender: Frederick Peers in Treatment: 70 Vital Signs Time Taken: 14:13 Temperature (F): 98.2 Height (in): 71 Pulse (bpm): 77 Weight (lbs): 175 Respiratory Rate (breaths/min): 16 Body Mass Index (BMI): 24.4 Blood Pressure (mmHg): 109/62 Reference Range: 80 - 120 mg / dl Electronic Signature(s) Signed: 02/23/2021 4:44:07 PM By: Levan Hurst RN, BSN Entered By: Levan Hurst on 02/19/2021 14:27:53

## 2021-02-26 ENCOUNTER — Encounter (HOSPITAL_BASED_OUTPATIENT_CLINIC_OR_DEPARTMENT_OTHER): Payer: Medicare HMO | Admitting: Internal Medicine

## 2021-03-19 ENCOUNTER — Other Ambulatory Visit: Payer: Self-pay

## 2021-03-19 ENCOUNTER — Encounter (HOSPITAL_BASED_OUTPATIENT_CLINIC_OR_DEPARTMENT_OTHER): Payer: Medicare HMO | Attending: Internal Medicine | Admitting: Internal Medicine

## 2021-03-19 DIAGNOSIS — L89154 Pressure ulcer of sacral region, stage 4: Secondary | ICD-10-CM | POA: Insufficient documentation

## 2021-03-19 DIAGNOSIS — E11621 Type 2 diabetes mellitus with foot ulcer: Secondary | ICD-10-CM | POA: Diagnosis not present

## 2021-03-19 DIAGNOSIS — R6889 Other general symptoms and signs: Secondary | ICD-10-CM | POA: Diagnosis not present

## 2021-03-19 DIAGNOSIS — E1151 Type 2 diabetes mellitus with diabetic peripheral angiopathy without gangrene: Secondary | ICD-10-CM | POA: Insufficient documentation

## 2021-03-19 DIAGNOSIS — F0150 Vascular dementia without behavioral disturbance: Secondary | ICD-10-CM | POA: Insufficient documentation

## 2021-03-19 DIAGNOSIS — I10 Essential (primary) hypertension: Secondary | ICD-10-CM | POA: Insufficient documentation

## 2021-03-19 NOTE — Progress Notes (Signed)
KWAMANE, RESPICIO ([id number]) Visit Report for 03/19/2021 HPI Details Patient Name: Date of Service: [name], [name] 03/19/2021 1:15 PM Medical Record Number: [id number] Patient Account Number: [id number] Date of Birth/Sex: Treating RN: 10-31-1921 (87 y.o. [name] Primary Care Provider: PA Haig Prophet, NO Other Clinician: Referring Provider: Treating Provider/Extender: Frederick Peers in Treatment: 74 History of Present Illness HPI Description: /A3/17/2021 upon evaluation today patient presents for initial inspection here in our clinic concerning issues that has been having with a wound in the sacral region as well as the left heel. Fortunately neither appears to be overtly and significantly infected there is some necrotic tissue noted at both locations however that is going to be required to be addressed. He does have a history of diabetes mellitus type 2, vascular dementia, and hypertension. The patient did have a stroke in November 2020 unfortunately he has not been the same since that time according to his daughter. Prior to that he was taking care of himself now he is 100% dependent. He is at home with his daughter who is present during the office visit today as well that is where I get the majority of the history from at this point. Patient does have a history of hypertension as well which does not appear to be too out of control today which is good news. He does have a hemoglobin A1c of 6.05 May 2019. They currently have been using a calcium alginate dressing to the sacrum and it sounds like Skin-Prep and a foam on the heel. 4/9; pressure ulcers on the sacral area and left heel. Been using Anasept wet-to-dry on the sacrum and Hydrofera Blue in the heel. He has home health family is helping with the dressing. It sounds as though they are fairly religious about offloading these areas that have pressure relief surfaces for the bed. Wounds look better 4/30; I have not  seen this patient in 3 weeks. We have been using Anasept wet-to-dry the sacrum and Hydrofera Blue on the heel. When I saw this 3 weeks ago the sacrum actually look quite good and I thought that we might be able to transition him to a collagen-based dressing today. 5/10; the since the patient was last here he was hospitalized from 5/5 through 12/07/2019. He was noted to have a pressure injury of the sacral region stage IV. MRI did not suggest discitis or osteomyelitis. There was nonspecific bilateral paraspinal muscle edema. Culture I did when he was here the last time showed E. coli I believe they are aware of this. Blood cultures were negative. His white count was 10.8 on admission 11.9 at discharge his comprehensive metabolic panel notable for an albumin of 2.4 BUN of 32 with creatinine of 0.03 hemoglobin was only 7.3 with a discharge hemoglobin of 8.1 after dropping to a hemoglobin of 6.6 He is back at home. Discharge antibiotic was Augmentin.[name] He was treated with IV Rocephin in the hospital. The patient was seen by palliative care I have not looked at this consult. They have been using dressings that they had left over at home I think most recently [facility]. According to his daughter the patient is eating fairly well 12/28/19-Patient is back at 2 weeks, the sacral wound is about the same, the right heel has healed and closed. We are using silver alginate to the sacral wound 6/18; we continue to follow the sacral wound. His heel wounds have closed. We have been using silver alginate to the sacrum 7/1; sacral wound.  We have been using silver collagen with backing wet-to-dry. No open wounds on the heels. 7/15; some improvement in the dimensions of the sacral wound including the undermining area we have been using silver collagen with backing wet-to-dry dressings. We had some thoughts about trying a wound VAC. He has home health. 03/13/20-Sacral wound looks intact with healthy base, surrounding skin  intact, no maceration, he did unfortunately develop to abrasion wounds on his first second and third toes dorsum of the Right foot this happened when his foot got trapped below the wheelchair Footguard 8/30; sacral wound has come down slightly especially the undermining. We have been using a wound VAC She has 2 necrotic areas on the tip of the right first toe and the right second toe dorsally. 10/8 Is been about 5 weeks since I have seen this man. Family tells me that the sacrum really deteriorated over the last week or 2. The blame home health for some back issues. Dimensions here are a lot larger than last time including the undermining and there is now exposed bone. He had an MRI done in May when he was hospitalized at this area that did not show osteomyelitis nevertheless there is been a fairly significant deterioration since I last saw this wound. He has small abrasions on the right dorsal second and third toes which look as though they are progressing towards closure 10/22; the patient came in 2 weeks ago with exposed bone. Sample showed osteomyelitis culture Enterococcus. Given his frailty and the state of his dementia I elected to try to treat him with oral Augmentin currently on 500 3 times daily. I am going to try to keep him on this for 6 weeks. He is eating and drinking well taking the antibiotics well. So far no overt side effects 11/12; unfortunately the patient was away from clinic longer than I thought he was going to be in they ran out of Augmentin about a week ago. I'm going to renew this but reduce the dose somewhat to twice a day. His wound looks better we've been using silver alginate. No exposed bone today. 12/7; the patient is finished Augmentin had roughly 28 days. For some reason pharmacy will not fill it 2-week supply of liquid Augmentin, in any case I am finished giving him antibiotics. This was for the bone specimen that showed Enterococcus he has been on this for at least 6  weeks. Some improvement in wound area. There is still some exposed bone superiorly in the swollen 08/12/2020; 1 month follow-up. We treated him for underlying osteomyelitis. He is using silver collagen backing wet-to-dry. Advanced dementia precludes use of a wound VAC. I did talk with his daughter today about advanced treatment products but she is having to change the dressing too frequently during the week to make this possible. He is not a candidate for plastic surgery therefore I really think that the collagen backing wet-to- dry is the best option here. He has 1.1 cm in direct depth and 1.9 cm and undermining from 11-6 o'clock. 2/11; 1 month follow-up. The wound looks essentially the same. This does not probe to bone the base of it seems to have healthy granulation but in terms of the overall wound volume undermining I think this is all about the same. I went over the options last visit with his daughter which would include an advanced treatment product necessitating more frequent visits. Because of his dementia and agitation I do not think he is a candidate for wound VAC or  plastic surgery 3/17; 1 month follow-up. The wound looks generally the same. Lower sacrum healthy granulation no exposed bone minimal amount of undermining. He is family reminds me that we did try wound VAC back in the early part of the fall last year however they are wondering whether we could try it again. Looking back over our records this was interrupted by exposed bone and probable osteomyelitis. I treated him with at least 4 weeks of oral Augmentin. I did not think we can get him through an MRI. 4/28; 1 month follow-up. Patient is currently using a wound VAC. Patient's daughter is present. There are no issues or complaints today. 6/30; its been 2 months since the patient was in the clinic. He is still using silver collagen on the wound bed under wound VAC. He arrives in clinic today with the dimensions better including,  however and right in the middle of this wide open area of exposed bone. There is no obvious infection. This is not the first time this is happening. I treated him previously with 4 weeks of oral Augmentin. 7/21; we had some difficulty getting this patient back in. Bone biopsy I did last time showed chronic osteomyelitis. Culture of the bone showed Enterococcus faecalis. I started him on Augmentin on 02/04/2021 2 weeks worth. The daughter crushes the tablets. I am going to give him an additional 2 weeks with 1 renewal. He seems to be tolerating this well The patient has advanced dementia. It is difficult for them to transport him into our clinic. I do not think he is a candidate for a wound VAC. It is difficult to see setting him up for IV antibiotics although certainly a long-acting antibiotic such as Dalvance might be possible if this deteriorates again. I have talked to the daughter today about the likely palliative nature of this wound 8/18; the patient is completing his Augmentin. He has had 6 weeks. The wound actually looks better although dimensions are I think about the same. We have been using silver collagen with backing wet-to-dry Electronic Signature(s) Signed: 03/19/2021 4:52:40 PM By: Linton Ham MD Entered By: Linton Ham on 03/19/2021 14:16:54 -------------------------------------------------------------------------------- Physical Exam Details Patient Name: Date of Service: [name], [name] 03/19/2021 1:15 PM Medical Record Number: [id number] Patient Account Number: [id number] Date of Birth/Sex: Treating RN: 1922-02-26 (87 y.o. [name] Primary Care Provider: PA Haig Prophet, NO Other Clinician: Referring Provider: Treating Provider/Extender: Frederick Peers in Treatment: 74 Constitutional Sitting or standing Blood Pressure is within target range for patient.. Pulse regular and within target range for patient.[name] Respirations regular, non-labored  and within target range.. Temperature is normal and within the target range for the patient.[name] Appears in no distress. Notes Wound exam; sacral decubitus ulcer. There is no exposed bone today which is an improvement no surrounding soft tissue infection is obvious. The direct depth and the undermining from roughly 6-2 o'clock maximum at about 10:00 is about the same. 2.1 cm maximum at 10:00 Electronic Signature(s) Signed: 03/19/2021 4:52:40 PM By: Linton Ham MD Entered By: Linton Ham on 03/19/2021 14:17:56 -------------------------------------------------------------------------------- Physician Orders Details Patient Name: Date of Service: [address] RYKIN, [name] 03/19/2021 1:15 PM Medical Record Number: [id number] Patient Account Number: [id number] Date of Birth/Sex: Treating RN: 02-19-1922 (87 y.o. [name] Primary Care Provider: PA Haig Prophet, NO Other Clinician: Referring Provider: Treating Provider/Extender: Frederick Peers in Treatment: 34 Verbal / Phone Orders: No Diagnosis Coding ICD-10 Coding Code Description L89.154 Pressure ulcer of sacral region,  stage 4 M86.18 Other acute osteomyelitis, other site Follow-up Appointments Return appointment in 1 month. - Dr. Dellia Nims ***extra time [facility] lift*** 60 minutes Call if any concerns or questions. Off-Loading Low air-loss mattress (Group 2) Turn and reposition every 2 hours Home Health New wound care orders this week; continue Home Health for wound care. May utilize formulary equivalent dressing for wound treatment orders unless otherwise specified. - [facility] 2-3 times a week. Apply silver collagen with wet to dry backing cover with abd pad. fax# [phone]. Wound Treatment Wound #1 - Sacrum Cleanser: Wound Cleanser (Home Health) 3 x Per Week/30 Days Discharge Instructions: Cleanse the wound with wound cleanser prior to applying a clean dressing using gauze sponges, not tissue or cotton  balls. Prim Dressing: Promogran Prisma Matrix, 4.34 (sq in) (silver collagen) (Home Health) 3 x Per Week/30 Days ary Discharge Instructions: Moisten collagen with saline or hydrogel Prim Dressing: wet to dry gauze (Home Health) 3 x Per Week/30 Days ary Discharge Instructions: backing the prisma with saline moisten gauze. Secondary Dressing: ABD Pad, 8x10 (Home Health) 3 x Per Week/30 Days Discharge Instructions: Apply over primary dressing as directed. Secured With: 44M Medipore H Soft Cloth Surgical T 4 x 2 (in/yd) (Home Health) 3 x Per Week/30 Days ape Discharge Instructions: Secure dressing with tape as directed. Electronic Signature(s) Signed: 03/19/2021 4:52:40 PM By: Linton Ham MD Signed: 03/19/2021 6:11:30 PM By: Deon Pilling Entered By: Deon Pilling on 03/19/2021 14:14:51 -------------------------------------------------------------------------------- Problem List Details Patient Name: Date of Service: [facility] JASAIAH, SCACCIA 03/19/2021 1:15 PM Medical Record Number: [id number] Patient Account Number: [id number] Date of Birth/Sex: Treating RN: [birth year] (87 y.o. [name] Primary Care Provider: PA Haig Prophet, NO Other Clinician: Referring Provider: Treating Provider/Extender: Frederick Peers in Treatment: 74 Active Problems ICD-10 Encounter Code Description Active Date MDM Diagnosis L89.154 Pressure ulcer of sacral region, stage 4 10/17/2019 No Yes M86.18 Other acute osteomyelitis, other site 05/23/2020 No Yes Inactive Problems ICD-10 Code Description Active Date Inactive Date E11.621 Type 2 diabetes mellitus with foot ulcer 10/17/2019 10/17/2019 L97.522 Non-pressure chronic ulcer of other part of left foot with fat layer exposed 10/17/2019 10/17/2019 F01.50 Vascular dementia without behavioral disturbance 10/17/2019 10/17/2019 I10 Essential (primary) hypertension 10/17/2019 10/17/2019 L97.518 Non-pressure chronic ulcer of other part of right foot with  other specified severity 03/31/2020 03/31/2020 L03.031 Cellulitis of right toe 03/31/2020 03/31/2020 Resolved Problems Electronic Signature(s) Signed: 03/19/2021 4:52:40 PM By: Linton Ham MD Entered By: Linton Ham on 03/19/2021 14:16:15 -------------------------------------------------------------------------------- Progress Note Details Patient Name: Date of Service: [address] [name], [name] 03/19/2021 1:15 PM Medical Record Number: [id number] Patient Account Number: [id number] Date of Birth/Sex: Treating RN: 1921-10-08 (87 y.o. [name] Primary Care Provider: PA Haig Prophet, NO Other Clinician: Referring Provider: Treating Provider/Extender: Frederick Peers in Treatment: 74 Subjective History of Present Illness (HPI) /A3/17/2021 upon evaluation today patient presents for initial inspection here in our clinic concerning issues that has been having with a wound in the sacral region as well as the left heel. Fortunately neither appears to be overtly and significantly infected there is some necrotic tissue noted at both locations however that is going to be required to be addressed. He does have a history of diabetes mellitus type 2, vascular dementia, and hypertension. The patient did have a stroke in November 2020 unfortunately he has not been the same since that time according to his daughter. Prior to that he was taking care of himself now he is 100% dependent.  He is at home with his daughter who is present during the office visit today as well that is where I get the majority of the history from at this point. Patient does have a history of hypertension as well which does not appear to be too out of control today which is good news. He does have a hemoglobin A1c of 6.05 May 2019. They currently have been using a calcium alginate dressing to the sacrum and it sounds like Skin-Prep and a foam on the heel. 4/9; pressure ulcers on the sacral area and left heel. Been using  Anasept wet-to-dry on the sacrum and Hydrofera Blue in the heel. He has home health family is helping with the dressing. It sounds as though they are fairly religious about offloading these areas that have pressure relief surfaces for the bed. Wounds look better 4/30; I have not seen this patient in 3 weeks. We have been using Anasept wet-to-dry the sacrum and Hydrofera Blue on the heel. When I saw this 3 weeks ago the sacrum actually look quite good and I thought that we might be able to transition him to a collagen-based dressing today. 5/10; the since the patient was last here he was hospitalized from 5/5 through 12/07/2019. He was noted to have a pressure injury of the sacral region stage IV. MRI did not suggest discitis or osteomyelitis. There was nonspecific bilateral paraspinal muscle edema. Culture I did when he was here the last time showed E. coli I believe they are aware of this. Blood cultures were negative. His white count was 10.8 on admission 11.9 at discharge his comprehensive metabolic panel notable for an albumin of 2.4 BUN of 32 with creatinine of 0.03 hemoglobin was only 7.3 with a discharge hemoglobin of 8.1 after dropping to a hemoglobin of 6.6 He is back at home. Discharge antibiotic was Augmentin.[name] He was treated with IV Rocephin in the hospital. The patient was seen by palliative care I have not looked at this consult. They have been using dressings that they had left over at home I think most recently [facility]. According to his daughter the patient is eating fairly well 12/28/19-Patient is back at 2 weeks, the sacral wound is about the same, the right heel has healed and closed. We are using silver alginate to the sacral wound 6/18; we continue to follow the sacral wound. His heel wounds have closed. We have been using silver alginate to the sacrum 7/1; sacral wound. We have been using silver collagen with backing wet-to-dry. No open wounds on the heels. 7/15; some  improvement in the dimensions of the sacral wound including the undermining area we have been using silver collagen with backing wet-to-dry dressings. We had some thoughts about trying a wound VAC. He has home health. 03/13/20-Sacral wound looks intact with healthy base, surrounding skin intact, no maceration, he did unfortunately develop to abrasion wounds on his first second and third toes dorsum of the Right foot this happened when his foot got trapped below the wheelchair Footguard 8/30; sacral wound has come down slightly especially the undermining. We have been using a wound VAC ooShe has 2 necrotic areas on the tip of the right first toe and the right second toe dorsally. 10/8 Is been about 5 weeks since I have seen this man. Family tells me that the sacrum really deteriorated over the last week or 2. The blame home health for some back issues. Dimensions here are a lot larger than last time including the undermining  and there is now exposed bone. He had an MRI done in May when he was hospitalized at this area that did not show osteomyelitis nevertheless there is been a fairly significant deterioration since I last saw this wound. He has small abrasions on the right dorsal second and third toes which look as though they are progressing towards closure 10/22; the patient came in 2 weeks ago with exposed bone. Sample showed osteomyelitis culture Enterococcus. Given his frailty and the state of his dementia I elected to try to treat him with oral Augmentin currently on 500 3 times daily. I am going to try to keep him on this for 6 weeks. He is eating and drinking well taking the antibiotics well. So far no overt side effects 11/12; unfortunately the patient was away from clinic longer than I thought he was going to be in they ran out of Augmentin about a week ago. I'm going to renew this but reduce the dose somewhat to twice a day. His wound looks better we've been using silver alginate. No exposed  bone today. 12/7; the patient is finished Augmentin had roughly 28 days. For some reason pharmacy will not fill it 2-week supply of liquid Augmentin, in any case I am finished giving him antibiotics. This was for the bone specimen that showed Enterococcus he has been on this for at least 6 weeks. Some improvement in wound area. There is still some exposed bone superiorly in the swollen 08/12/2020; 1 month follow-up. We treated him for underlying osteomyelitis. He is using silver collagen backing wet-to-dry. Advanced dementia precludes use of a wound VAC. I did talk with his daughter today about advanced treatment products but she is having to change the dressing too frequently during the week to make this possible. He is not a candidate for plastic surgery therefore I really think that the collagen backing wet-to- dry is the best option here. He has 1.1 cm in direct depth and 1.9 cm and undermining from 11-6 o'clock. 2/11; 1 month follow-up. The wound looks essentially the same. This does not probe to bone the base of it seems to have healthy granulation but in terms of the overall wound volume undermining I think this is all about the same. I went over the options last visit with his daughter which would include an advanced treatment product necessitating more frequent visits. Because of his dementia and agitation I do not think he is a candidate for wound VAC or plastic surgery 3/17; 1 month follow-up. The wound looks generally the same. Lower sacrum healthy granulation no exposed bone minimal amount of undermining. He is family reminds me that we did try wound VAC back in the early part of the fall last year however they are wondering whether we could try it again. Looking back over our records this was interrupted by exposed bone and probable osteomyelitis. I treated him with at least 4 weeks of oral Augmentin. I did not think we can get him through an MRI. 4/28; 1 month follow-up. Patient is  currently using a wound VAC. Patient's daughter is present. There are no issues or complaints today. 6/30; its been 2 months since the patient was in the clinic. He is still using silver collagen on the wound bed under wound VAC. He arrives in clinic today with the dimensions better including, however and right in the middle of this wide open area of exposed bone. There is no obvious infection. This is not the first time this is happening. I  treated him previously with 4 weeks of oral Augmentin. 7/21; we had some difficulty getting this patient back in. Bone biopsy I did last time showed chronic osteomyelitis. Culture of the bone showed Enterococcus faecalis. I started him on Augmentin on 02/04/2021 2 weeks worth. The daughter crushes the tablets. I am going to give him an additional 2 weeks with 1 renewal. He seems to be tolerating this well The patient has advanced dementia. It is difficult for them to transport him into our clinic. I do not think he is a candidate for a wound VAC. It is difficult to see setting him up for IV antibiotics although certainly a long-acting antibiotic such as Dalvance might be possible if this deteriorates again. I have talked to the daughter today about the likely palliative nature of this wound 8/18; the patient is completing his Augmentin. He has had 6 weeks. The wound actually looks better although dimensions are I think about the same. We have been using silver collagen with backing wet-to-dry Objective Constitutional Sitting or standing Blood Pressure is within target range for patient.. Pulse regular and within target range for patient.[name] Respirations regular, non-labored and within target range.. Temperature is normal and within the target range for the patient.[name] Appears in no distress. Vitals Time Taken: 1:50 PM, Height: 71 in, Weight: 175 lbs, BMI: 24.4, Temperature: 97.5 F, Pulse: 84 bpm, Respiratory Rate: 16 breaths/min, Blood Pressure: 113/67  mmHg. General Notes: Wound exam; sacral decubitus ulcer. There is no exposed bone today which is an improvement no surrounding soft tissue infection is obvious. The direct depth and the undermining from roughly 6-2 o'clock maximum at about 10:00 is about the same. 2.1 cm maximum at 10:00 Integumentary (Hair, Skin) Wound #1 status is Open. Original cause of wound was Pressure Injury. The date acquired was: 09/03/2019. The wound has been in treatment 74 weeks. The wound is located on the Sacrum. The wound measures 1.5cm length x 1.7cm width x 1.5cm depth; 2.003cm^2 area and 3.004cm^3 volume. There is Fat Layer (Subcutaneous Tissue) exposed. There is no tunneling noted, however, there is undermining starting at 6:00 and ending at 12:00 with a maximum distance of 2.1cm. There is a medium amount of serosanguineous drainage noted. The wound margin is well defined and not attached to the wound base. There is large (67- 100%) pink granulation within the wound bed. There is no necrotic tissue within the wound bed. Assessment Active Problems ICD-10 Pressure ulcer of sacral region, stage 4 Other acute osteomyelitis, other site Plan Follow-up Appointments: Return appointment in 1 month. - Dr. Dellia Nims ***extra time [facility] lift*** 60 minutes Call if any concerns or questions. Off-Loading: Low air-loss mattress (Group 2) Turn and reposition every 2 hours Home Health: New wound care orders this week; continue Home Health for wound care. May utilize formulary equivalent dressing for wound treatment orders unless otherwise specified. - [facility] 2-3 times a week. Apply silver collagen with wet to dry backing cover with abd pad. fax# [phone]. WOUND #1: - Sacrum Wound Laterality: Cleanser: Wound Cleanser (Home Health) 3 x Per Week/30 Days Discharge Instructions: Cleanse the wound with wound cleanser prior to applying a clean dressing using gauze sponges, not tissue or cotton balls. Prim Dressing:  Promogran Prisma Matrix, 4.34 (sq in) (silver collagen) (Home Health) 3 x Per Week/30 Days ary Discharge Instructions: Moisten collagen with saline or hydrogel Prim Dressing: wet to dry gauze (Home Health) 3 x Per Week/30 Days ary Discharge Instructions: backing the prisma with saline moisten gauze.  Secondary Dressing: ABD Pad, 8x10 (Home Health) 3 x Per Week/30 Days Discharge Instructions: Apply over primary dressing as directed. Secured With: 69M Medipore H Soft Cloth Surgical T 4 x 2 (in/yd) (Home Health) 3 x Per Week/30 Days ape Discharge Instructions: Secure dressing with tape as directed. 1. I am continuing with the silver collagen backing wet-to-dry 2. I think the antibiotics can stop now. The wound bed is more appropriate there is no exposed bone. 3. I do not believe the patient is a candidate for wound VAC because of the advanced state of his dementia certainly not plastic surgery. Electronic Signature(s) Signed: 03/19/2021 4:52:40 PM By: Linton Ham MD Entered By: Linton Ham on 03/19/2021 14:18:40 -------------------------------------------------------------------------------- SuperBill Details Patient Name: Date of Service: [facility] [name], [name] 03/19/2021 Medical Record Number: [id number] Patient Account Number: [id number] Date of Birth/Sex: Treating RN: 03/12/1922 (87 y.o. [name] Primary Care Provider: PA Haig Prophet, NO Other Clinician: Referring Provider: Treating Provider/Extender: Frederick Peers in Treatment: 74 Diagnosis Coding ICD-10 Codes Code Description L89.154 Pressure ulcer of sacral region, stage 4 M86.18 Other acute osteomyelitis, other site Facility Procedures CPT4 Code: [id number] Description: 99214 - WOUND CARE VISIT-LEV 4 EST PT Modifier: Quantity: 1 Physician Procedures : CPT4 Code Description Modifier [id number] - WC PHYS LEVEL 3 - EST PT ICD-10 Diagnosis Description L89.154 Pressure ulcer of sacral region, stage 4  M86.18 Other acute osteomyelitis, other site Quantity: 1 Electronic Signature(s) Signed: 03/19/2021 4:52:40 PM By: Linton Ham MD Entered By: Linton Ham on 03/19/2021 14:18:58

## 2021-03-25 NOTE — Progress Notes (Signed)
[name], [name] ([id number]) Visit Report for 03/19/2021 Arrival Information Details Patient Name: Date of Service: [name], [name] 03/19/2021 1:15 PM Medical Record Number: [id number] Patient Account Number: [id number] Date of Birth/Sex: Treating RN: 29-Mar-1922 (87 y.o. [name] Primary Care Loki Wuthrich: PA Haig Prophet, NO Other Clinician: Referring Angline Schweigert: Treating Robbi Spells/Extender: Frederick Peers in Treatment: 74 Visit Information History Since Last Visit Added or deleted any medications: No Patient Arrived: Wheel Chair Any new allergies or adverse reactions: No Arrival Time: 13:49 Had a fall or experienced change in No Accompanied By: dauhter activities of daily living that may affect Transfer Assistance: Harrel Lemon Lift risk of falls: Patient Identification Verified: Yes Signs or symptoms of abuse/neglect since last visito No Secondary Verification Process Completed: Yes Hospitalized since last visit: No Patient Requires Transmission-Based Precautions: No Implantable device outside of the clinic excluding No Patient Has Alerts: No cellular tissue based products placed in the center since last visit: Has Dressing in Place as Prescribed: Yes Pain Present Now: No Electronic Signature(s) Signed: 03/25/2021 9:12:52 AM By: Sandre Kitty Entered By: Sandre Kitty on 03/19/2021 13:50:00 -------------------------------------------------------------------------------- Clinic Level of Care Assessment Details Patient Name: Date of Service: [name], [name] 03/19/2021 1:15 PM Medical Record Number: [id number] Patient Account Number: [id number] Date of Birth/Sex: Treating RN: 01-12-1922 (87 y.o. [name] Primary Care Andreas Sobolewski: PA Haig Prophet, NO Other Clinician: Referring Nhung Danko: Treating Senan Urey/Extender: Frederick Peers in Treatment: 74 Clinic Level of Care Assessment Items TOOL 4 Quantity Score X- 1 0 Use when only an EandM is  performed on FOLLOW-UP visit ASSESSMENTS - Nursing Assessment / Reassessment X- 1 10 Reassessment of Co-morbidities (includes updates in patient status) X- 1 5 Reassessment of Adherence to Treatment Plan ASSESSMENTS - Wound and Skin A ssessment / Reassessment '[]'$  - 0 Simple Wound Assessment / Reassessment - one wound X- 1 5 Complex Wound Assessment / Reassessment - multiple wounds X- 1 10 Dermatologic / Skin Assessment (not related to wound area) ASSESSMENTS - Focused Assessment '[]'$  - 0 Circumferential Edema Measurements - multi extremities X- 1 10 Nutritional Assessment / Counseling / Intervention '[]'$  - 0 Lower Extremity Assessment (monofilament, tuning fork, pulses) '[]'$  - 0 Peripheral Arterial Disease Assessment (using hand held doppler) ASSESSMENTS - Ostomy and/or Continence Assessment and Care '[]'$  - 0 Incontinence Assessment and Management '[]'$  - 0 Ostomy Care Assessment and Management (repouching, etc.) PROCESS - Coordination of Care '[]'$  - 0 Simple Patient / Family Education for ongoing care X- 1 20 Complex (extensive) Patient / Family Education for ongoing care X- 1 10 Staff obtains Programmer, systems, Records, T Results / Process Orders est X- 1 10 Staff telephones HHA, Nursing Homes / Clarify orders / etc '[]'$  - 0 Routine Transfer to another Facility (non-emergent condition) '[]'$  - 0 Routine [name] Admission (non-emergent condition) '[]'$  - 0 New Admissions / Biomedical engineer / Ordering NPWT Apligraf, etc. , '[]'$  - 0 Emergency [name] Admission (emergent condition) '[]'$  - 0 Simple Discharge Coordination X- 1 15 Complex (extensive) Discharge Coordination PROCESS - Special Needs '[]'$  - 0 Pediatric / Minor Patient Management '[]'$  - 0 Isolation Patient Management '[]'$  - 0 Hearing / Language / Visual special needs '[]'$  - 0 Assessment of [name] assistance (transportation, D/C planning, etc.) '[]'$  - 0 Additional assistance / Altered mentation '[]'$  - 0 Support Surface(s) Assessment  (bed, cushion, seat, etc.) INTERVENTIONS - Wound Cleansing / Measurement '[]'$  - 0 Simple Wound Cleansing - one wound X- 1 5 Complex Wound Cleansing - multiple wounds X- 1  5 Wound Imaging (photographs - any number of wounds) '[]'$  - 0 Wound Tracing (instead of photographs) '[]'$  - 0 Simple Wound Measurement - one wound X- 1 5 Complex Wound Measurement - multiple wounds INTERVENTIONS - Wound Dressings X - Small Wound Dressing one or multiple wounds 1 10 '[]'$  - 0 Medium Wound Dressing one or multiple wounds '[]'$  - 0 Large Wound Dressing one or multiple wounds '[]'$  - 0 Application of Medications - topical '[]'$  - 0 Application of Medications - injection INTERVENTIONS - Miscellaneous '[]'$  - 0 External ear exam '[]'$  - 0 Specimen Collection (cultures, biopsies, blood, body fluids, etc.) '[]'$  - 0 Specimen(s) / Culture(s) sent or taken to Lab for analysis '[]'$  - 0 Patient Transfer (multiple staff / Civil Service fast streamer / Similar devices) '[]'$  - 0 Simple Staple / Suture removal (25 or less) '[]'$  - 0 Complex Staple / Suture removal (26 or more) '[]'$  - 0 Hypo / Hyperglycemic Management (close monitor of Blood Glucose) '[]'$  - 0 Ankle / Brachial Index (ABI) - do not check if billed separately X- 1 5 Vital Signs Has the patient been seen at the [name] within the last three years: Yes Total Score: 125 Level Of Care: New/Established - Level 4 Electronic Signature(s) Signed: 03/19/2021 6:11:30 PM By: Deon Pilling Entered By: Deon Pilling on 03/19/2021 14:16:00 -------------------------------------------------------------------------------- Encounter Discharge Information Details Patient Name: Date of Service: [name] 03/19/2021 1:15 PM Medical Record Number: [id number] Patient Account Number: [id number] Date of Birth/Sex: Treating RN: [birth year] (87 y.o. [name] Primary Care Hephzibah Strehle: PA Haig Prophet, NO Other Clinician: Referring Harlym Gehling: Treating Vinie Charity/Extender: Frederick Peers in  Treatment: 74 Encounter Discharge Information Items Discharge Condition: Stable Ambulatory Status: Wheelchair Discharge Destination: Home Transportation: Private Auto Accompanied By: daughter Schedule Follow-up Appointment: Yes Clinical Summary of Care: Electronic Signature(s) Signed: 03/19/2021 6:11:30 PM By: Deon Pilling Entered By: Deon Pilling on 03/19/2021 14:16:43 -------------------------------------------------------------------------------- Multi Wound Chart Details Patient Name: Date of Service: [facility] [name], [name] 03/19/2021 1:15 PM Medical Record Number: [id number] Patient Account Number: [id number] Date of Birth/Sex: Treating RN: Jan 28, 1922 (87 y.o. [name] Primary Care Jens Siems: PA Haig Prophet, NO Other Clinician: Referring Jaquesha Boroff: Treating Burke Terry/Extender: Frederick Peers in Treatment: 74 Vital Signs Height(in): 40 Pulse(bpm): 41 Weight(lbs): 175 Blood Pressure(mmHg): 113/67 Body Mass Index(BMI): 24 Temperature(F): 97.5 Respiratory Rate(breaths/min): 16 Photos: [1:Sacrum] [N/A:N/A N/A] Wound Location: [1:Pressure Injury] [N/A:N/A] Wounding Event: [1:Pressure Ulcer] [N/A:N/A] Primary Etiology: [1:Glaucoma, Chronic Obstructive] [N/A:N/A] Comorbid History: [1:Pulmonary Disease (COPD), Coronary Artery Disease, Hypertension, Type II Diabetes, Osteoarthritis, Dementia 09/03/2019] [N/A:N/A] Date Acquired: [1:74] [N/A:N/A] Weeks of Treatment: [1:Open] [N/A:N/A] Wound Status: [1:1.5x1.7x1.5] [N/A:N/A] Measurements L x W x D (cm) [1:2.003] [N/A:N/A] A (cm) : rea [1:3.004] [N/A:N/A] Volume (cm) : [1:77.00%] [N/A:N/A] % Reduction in A rea: [1:79.70%] [N/A:N/A] % Reduction in Volume: [1:6] Starting Position 1 (o'clock): [1:12] Ending Position 1 (o'clock): [1:2.1] Maximum Distance 1 (cm): [1:Yes] [N/A:N/A] Undermining: [1:Category/Stage IV] [N/A:N/A] Classification: [1:Medium] [N/A:N/A] Exudate A mount: [1:Serosanguineous] [N/A:N/A] Exudate  Type: [1:red, brown] [N/A:N/A] Exudate Color: [1:Well defined, not attached] [N/A:N/A] Wound Margin: [1:Large (67-100%)] [N/A:N/A] Granulation A mount: [1:Pink] [N/A:N/A] Granulation Quality: [1:None Present (0%)] [N/A:N/A] Necrotic A mount: [1:Fat Layer (Subcutaneous Tissue): Yes N/A] Exposed Structures: [1:Fascia: No Tendon: No Muscle: No Joint: No Bone: No Small (1-33%)] [N/A:N/A] Treatment Notes Electronic Signature(s) Signed: 03/19/2021 4:52:40 PM By: Linton Ham MD Signed: 03/19/2021 6:11:30 PM By: Deon Pilling Entered By: Linton Ham on 03/19/2021 14:16:21 -------------------------------------------------------------------------------- Multi-Disciplinary Care Plan Details Patient Name: Date of Service:  [name] 03/19/2021 1:15 PM Medical Record Number: [id number] Patient Account Number: [id number] Date of Birth/Sex: Treating RN: Jul 26, 1922 (87 y.o. [name] Primary Care Lexxi Koslow: PA Haig Prophet, NO Other Clinician: Referring Alanis Clift: Treating Jahir Halt/Extender: Frederick Peers in Treatment: 74 [place] reviewed with physician Active Inactive Wound/Skin Impairment Nursing Diagnoses: Impaired tissue integrity Knowledge deficit related to ulceration/compromised skin integrity Goals: Patient/caregiver will verbalize understanding of skin care regimen Date Initiated: 10/17/2019 Target Resolution Date: 05/01/2021 Goal Status: Active Ulcer/skin breakdown will have a volume reduction of 30% by week 4 Date Initiated: 10/17/2019 Date Inactivated: 12/28/2019 Target Resolution Date: 12/28/2019 Goal Status: Unmet Unmet Reason: chronic wound Interventions: Assess patient/caregiver ability to obtain necessary supplies Assess patient/caregiver ability to perform ulcer/skin care regimen upon admission and as needed Assess ulceration(s) every visit Provide education on ulcer and skin care Treatment Activities: Skin care regimen  initiated : 10/17/2019 Topical wound management initiated : 10/17/2019 Notes: Electronic Signature(s) Signed: 03/19/2021 6:11:30 PM By: Deon Pilling Entered By: Deon Pilling on 03/19/2021 14:15:01 -------------------------------------------------------------------------------- Pain Assessment Details Patient Name: Date of Service: [name], [name] 03/19/2021 1:15 PM Medical Record Number: [id number] Patient Account Number: [id number] Date of Birth/Sex: Treating RN: 1921-11-21 (87 y.o. [name] Primary Care Jamye Balicki: PA Haig Prophet, NO Other Clinician: Referring Louis Gaw: Treating Izmael Duross/Extender: Frederick Peers in Treatment: 74 Active Problems Location of Pain Severity and Description of Pain Patient Has Paino No Site Locations Pain Management and Medication Current Pain Management: Electronic Signature(s) Signed: 03/19/2021 6:11:30 PM By: Deon Pilling Signed: 03/25/2021 9:12:52 AM By: Sandre Kitty Entered By: Sandre Kitty on 03/19/2021 13:50:23 -------------------------------------------------------------------------------- Patient/Caregiver Education Details Patient Name: Date of Service: [name] 8/18/2022andnbsp1:15 PM Medical Record Number: [id number] Patient Account Number: [id number] Date of Birth/Gender: Treating RN: [birth year] (87 y.o. [name] Primary Care Physician: PA Haig Prophet, NO Other Clinician: Referring Physician: Treating Physician/Extender: Frederick Peers in Treatment: 9 Education Assessment Education Provided To: Caregiver Education Topics Provided Pressure: Handouts: Pressure Ulcers: Care and Offloading, Pressure Ulcers: Care and Offloading 2, Preventing Pressure Ulcers Methods: Explain/Verbal Responses: Reinforcements needed Electronic Signature(s) Signed: 03/19/2021 6:11:30 PM By: Deon Pilling Entered By: Deon Pilling on 03/19/2021  14:15:19 -------------------------------------------------------------------------------- Wound Assessment Details Patient Name: Date of Service: [name], [name] 03/19/2021 1:15 PM Medical Record Number: [id number] Patient Account Number: [id number] Date of Birth/Sex: Treating RN: [birth year] (87 y.o. [name] Primary Care Milicent Acheampong: PA TIENT, NO Other Clinician: Referring Aleigha Gilani: Treating Nhyira Leano/Extender: Frederick Peers in Treatment: 74 Wound Status Wound Number: 1 Primary Pressure Ulcer Etiology: Wound Location: Sacrum Wound Open Wounding Event: Pressure Injury Status: Date Acquired: 09/03/2019 Comorbid Glaucoma, Chronic Obstructive Pulmonary Disease (COPD), Weeks Of Treatment: 74 History: Coronary Artery Disease, Hypertension, Type II Diabetes, Clustered Wound: No Osteoarthritis, Dementia Photos Wound Measurements Length: (cm) 1.5 Width: (cm) 1.7 Depth: (cm) 1.5 Area: (cm) 2.003 Volume: (cm) 3.004 % Reduction in Area: 77% % Reduction in Volume: 79.7% Epithelialization: Small (1-33%) Tunneling: No Undermining: Yes Starting Position (o'clock): 6 Ending Position (o'clock): 12 Maximum Distance: (cm) 2.1 Wound Description Classification: Category/Stage IV Wound Margin: Well defined, not attached Exudate Amount: Medium Exudate Type: Serosanguineous Exudate Color: red, brown Foul Odor After Cleansing: No Slough/Fibrino No Wound Bed Granulation Amount: Large (67-100%) Exposed Structure Granulation Quality: Pink Fascia Exposed: No Necrotic Amount: None Present (0%) Fat Layer (Subcutaneous Tissue) Exposed: Yes Tendon Exposed: No Muscle Exposed: No Joint Exposed: No Bone Exposed: No Treatment Notes Wound #1 (Sacrum)  Cleanser Wound Cleanser Discharge Instruction: Cleanse the wound with wound cleanser prior to applying a clean dressing using gauze sponges, not tissue or cotton balls. Peri-Wound Care Topical Primary  Dressing Promogran Prisma Matrix, 4.34 (sq in) (silver collagen) Discharge Instruction: Moisten collagen with saline or hydrogel wet to dry gauze Discharge Instruction: backing the prisma with saline moisten gauze. Secondary Dressing ABD Pad, 8x10 Discharge Instruction: Apply over primary dressing as directed. Secured With 58M Medipore H Soft Cloth Surgical T 4 x 2 (in/yd) ape Discharge Instruction: Secure dressing with tape as directed. Compression Wrap Compression Stockings Add-Ons Electronic Signature(s) Signed: 03/19/2021 6:11:30 PM By: Deon Pilling Entered By: Deon Pilling on 03/19/2021 14:12:03 -------------------------------------------------------------------------------- Vitals Details Patient Name: Date of Service: [name], [name] 03/19/2021 1:15 PM Medical Record Number: [id number] Patient Account Number: [id number] Date of Birth/Sex: Treating RN: 1922/07/16 (87 y.o. [name] Primary Care Hollace Michelli: PA TIENT, NO Other Clinician: Referring Marye Eagen: Treating Loye Reininger/Extender: Frederick Peers in Treatment: 74 Vital Signs Time Taken: 13:50 Temperature (F): 97.5 Height (in): 71 Pulse (bpm): 84 Weight (lbs): 175 Respiratory Rate (breaths/min): 16 Body Mass Index (BMI): 24.4 Blood Pressure (mmHg): 113/67 Reference Range: 80 - 120 mg / dl Electronic Signature(s) Signed: 03/25/2021 9:12:52 AM By: Sandre Kitty Entered By: Sandre Kitty on 03/19/2021 13:50:18

## 2021-04-16 ENCOUNTER — Ambulatory Visit (HOSPITAL_BASED_OUTPATIENT_CLINIC_OR_DEPARTMENT_OTHER): Payer: Medicare HMO | Admitting: Internal Medicine

## 2021-04-17 ENCOUNTER — Other Ambulatory Visit (HOSPITAL_COMMUNITY): Payer: Self-pay

## 2021-04-22 ENCOUNTER — Encounter (HOSPITAL_COMMUNITY): Payer: Self-pay

## 2021-04-22 ENCOUNTER — Emergency Department (HOSPITAL_COMMUNITY): Payer: Medicare HMO

## 2021-04-22 ENCOUNTER — Encounter (HOSPITAL_BASED_OUTPATIENT_CLINIC_OR_DEPARTMENT_OTHER): Payer: Medicare HMO | Attending: Internal Medicine | Admitting: Internal Medicine

## 2021-04-22 ENCOUNTER — Other Ambulatory Visit: Payer: Self-pay

## 2021-04-22 ENCOUNTER — Inpatient Hospital Stay (HOSPITAL_COMMUNITY)
Admission: EM | Admit: 2021-04-22 | Discharge: 2021-05-02 | DRG: 100 | Disposition: A | Discharge status: Home / Self Care | Payer: Medicare HMO | Source: Ambulatory Visit | Attending: Internal Medicine | Admitting: Internal Medicine

## 2021-04-22 DIAGNOSIS — Z803 Family history of malignant neoplasm of breast: Secondary | ICD-10-CM

## 2021-04-22 DIAGNOSIS — Z888 Allergy status to other drugs, medicaments and biological substances status: Secondary | ICD-10-CM | POA: Diagnosis not present

## 2021-04-22 DIAGNOSIS — M4628 Osteomyelitis of vertebra, sacral and sacrococcygeal region: Secondary | ICD-10-CM | POA: Diagnosis present

## 2021-04-22 DIAGNOSIS — I959 Hypotension, unspecified: Secondary | ICD-10-CM | POA: Diagnosis not present

## 2021-04-22 DIAGNOSIS — L97112 Non-pressure chronic ulcer of right thigh with fat layer exposed: Secondary | ICD-10-CM | POA: Diagnosis not present

## 2021-04-22 DIAGNOSIS — J9601 Acute respiratory failure with hypoxia: Secondary | ICD-10-CM | POA: Diagnosis not present

## 2021-04-22 DIAGNOSIS — I2510 Atherosclerotic heart disease of native coronary artery without angina pectoris: Secondary | ICD-10-CM | POA: Diagnosis present

## 2021-04-22 DIAGNOSIS — R4020 Unspecified coma: Secondary | ICD-10-CM | POA: Diagnosis not present

## 2021-04-22 DIAGNOSIS — Z955 Presence of coronary angioplasty implant and graft: Secondary | ICD-10-CM

## 2021-04-22 DIAGNOSIS — R21 Rash and other nonspecific skin eruption: Secondary | ICD-10-CM | POA: Diagnosis not present

## 2021-04-22 DIAGNOSIS — Z7401 Bed confinement status: Secondary | ICD-10-CM

## 2021-04-22 DIAGNOSIS — L986 Other infiltrative disorders of the skin and subcutaneous tissue: Secondary | ICD-10-CM | POA: Diagnosis not present

## 2021-04-22 DIAGNOSIS — E440 Moderate protein-calorie malnutrition: Secondary | ICD-10-CM | POA: Insufficient documentation

## 2021-04-22 DIAGNOSIS — R64 Cachexia: Secondary | ICD-10-CM | POA: Diagnosis present

## 2021-04-22 DIAGNOSIS — R000 Tachycardia, unspecified: Secondary | ICD-10-CM | POA: Diagnosis not present

## 2021-04-22 DIAGNOSIS — M1990 Unspecified osteoarthritis, unspecified site: Secondary | ICD-10-CM | POA: Diagnosis present

## 2021-04-22 DIAGNOSIS — H409 Unspecified glaucoma: Secondary | ICD-10-CM | POA: Diagnosis present

## 2021-04-22 DIAGNOSIS — J690 Pneumonitis due to inhalation of food and vomit: Secondary | ICD-10-CM | POA: Diagnosis present

## 2021-04-22 DIAGNOSIS — Z823 Family history of stroke: Secondary | ICD-10-CM

## 2021-04-22 DIAGNOSIS — R1310 Dysphagia, unspecified: Secondary | ICD-10-CM | POA: Diagnosis not present

## 2021-04-22 DIAGNOSIS — F03B18 Unspecified dementia, moderate, with other behavioral disturbance: Secondary | ICD-10-CM

## 2021-04-22 DIAGNOSIS — G40802 Other epilepsy, not intractable, without status epilepticus: Secondary | ICD-10-CM | POA: Diagnosis not present

## 2021-04-22 DIAGNOSIS — R918 Other nonspecific abnormal finding of lung field: Secondary | ICD-10-CM | POA: Diagnosis not present

## 2021-04-22 DIAGNOSIS — J90 Pleural effusion, not elsewhere classified: Secondary | ICD-10-CM | POA: Diagnosis not present

## 2021-04-22 DIAGNOSIS — I10 Essential (primary) hypertension: Secondary | ICD-10-CM | POA: Diagnosis present

## 2021-04-22 DIAGNOSIS — Z681 Body mass index (BMI) 19 or less, adult: Secondary | ICD-10-CM

## 2021-04-22 DIAGNOSIS — R1312 Dysphagia, oropharyngeal phase: Secondary | ICD-10-CM | POA: Diagnosis not present

## 2021-04-22 DIAGNOSIS — E1151 Type 2 diabetes mellitus with diabetic peripheral angiopathy without gangrene: Secondary | ICD-10-CM | POA: Diagnosis not present

## 2021-04-22 DIAGNOSIS — L98492 Non-pressure chronic ulcer of skin of other sites with fat layer exposed: Secondary | ICD-10-CM | POA: Diagnosis not present

## 2021-04-22 DIAGNOSIS — Z993 Dependence on wheelchair: Secondary | ICD-10-CM

## 2021-04-22 DIAGNOSIS — D649 Anemia, unspecified: Secondary | ICD-10-CM | POA: Diagnosis present

## 2021-04-22 DIAGNOSIS — I69351 Hemiplegia and hemiparesis following cerebral infarction affecting right dominant side: Secondary | ICD-10-CM | POA: Diagnosis not present

## 2021-04-22 DIAGNOSIS — E1169 Type 2 diabetes mellitus with other specified complication: Secondary | ICD-10-CM | POA: Diagnosis present

## 2021-04-22 DIAGNOSIS — Z4682 Encounter for fitting and adjustment of non-vascular catheter: Secondary | ICD-10-CM | POA: Diagnosis not present

## 2021-04-22 DIAGNOSIS — R140 Abdominal distension (gaseous): Secondary | ICD-10-CM | POA: Diagnosis not present

## 2021-04-22 DIAGNOSIS — L97122 Non-pressure chronic ulcer of left thigh with fat layer exposed: Secondary | ICD-10-CM | POA: Diagnosis not present

## 2021-04-22 DIAGNOSIS — F0390 Unspecified dementia without behavioral disturbance: Secondary | ICD-10-CM | POA: Diagnosis present

## 2021-04-22 DIAGNOSIS — R0602 Shortness of breath: Secondary | ICD-10-CM | POA: Diagnosis not present

## 2021-04-22 DIAGNOSIS — J432 Centrilobular emphysema: Secondary | ICD-10-CM | POA: Diagnosis present

## 2021-04-22 DIAGNOSIS — Z7982 Long term (current) use of aspirin: Secondary | ICD-10-CM

## 2021-04-22 DIAGNOSIS — R9389 Abnormal findings on diagnostic imaging of other specified body structures: Secondary | ICD-10-CM | POA: Diagnosis present

## 2021-04-22 DIAGNOSIS — G9389 Other specified disorders of brain: Secondary | ICD-10-CM | POA: Diagnosis present

## 2021-04-22 DIAGNOSIS — Z794 Long term (current) use of insulin: Secondary | ICD-10-CM

## 2021-04-22 DIAGNOSIS — J9600 Acute respiratory failure, unspecified whether with hypoxia or hypercapnia: Secondary | ICD-10-CM | POA: Diagnosis not present

## 2021-04-22 DIAGNOSIS — R609 Edema, unspecified: Secondary | ICD-10-CM | POA: Diagnosis not present

## 2021-04-22 DIAGNOSIS — Z20822 Contact with and (suspected) exposure to covid-19: Secondary | ICD-10-CM | POA: Diagnosis not present

## 2021-04-22 DIAGNOSIS — Z515 Encounter for palliative care: Secondary | ICD-10-CM | POA: Diagnosis not present

## 2021-04-22 DIAGNOSIS — J984 Other disorders of lung: Secondary | ICD-10-CM | POA: Diagnosis not present

## 2021-04-22 DIAGNOSIS — Z87891 Personal history of nicotine dependence: Secondary | ICD-10-CM

## 2021-04-22 DIAGNOSIS — T148XXA Other injury of unspecified body region, initial encounter: Secondary | ICD-10-CM

## 2021-04-22 DIAGNOSIS — R238 Other skin changes: Secondary | ICD-10-CM | POA: Diagnosis present

## 2021-04-22 DIAGNOSIS — R6889 Other general symptoms and signs: Secondary | ICD-10-CM | POA: Diagnosis not present

## 2021-04-22 DIAGNOSIS — E872 Acidosis, unspecified: Secondary | ICD-10-CM | POA: Diagnosis present

## 2021-04-22 DIAGNOSIS — G9341 Metabolic encephalopathy: Secondary | ICD-10-CM | POA: Diagnosis present

## 2021-04-22 DIAGNOSIS — Z79899 Other long term (current) drug therapy: Secondary | ICD-10-CM

## 2021-04-22 DIAGNOSIS — R404 Transient alteration of awareness: Secondary | ICD-10-CM | POA: Diagnosis not present

## 2021-04-22 DIAGNOSIS — F32A Depression, unspecified: Secondary | ICD-10-CM | POA: Diagnosis present

## 2021-04-22 DIAGNOSIS — I700 Atherosclerosis of aorta: Secondary | ICD-10-CM | POA: Diagnosis not present

## 2021-04-22 DIAGNOSIS — Z978 Presence of other specified devices: Secondary | ICD-10-CM | POA: Diagnosis not present

## 2021-04-22 DIAGNOSIS — Z8261 Family history of arthritis: Secondary | ICD-10-CM

## 2021-04-22 DIAGNOSIS — E785 Hyperlipidemia, unspecified: Secondary | ICD-10-CM | POA: Diagnosis present

## 2021-04-22 DIAGNOSIS — Z825 Family history of asthma and other chronic lower respiratory diseases: Secondary | ICD-10-CM

## 2021-04-22 DIAGNOSIS — G40901 Epilepsy, unspecified, not intractable, with status epilepticus: Principal | ICD-10-CM | POA: Diagnosis present

## 2021-04-22 DIAGNOSIS — R29898 Other symptoms and signs involving the musculoskeletal system: Secondary | ICD-10-CM | POA: Diagnosis not present

## 2021-04-22 DIAGNOSIS — D6489 Other specified anemias: Secondary | ICD-10-CM | POA: Diagnosis not present

## 2021-04-22 DIAGNOSIS — R569 Unspecified convulsions: Secondary | ICD-10-CM

## 2021-04-22 DIAGNOSIS — Z833 Family history of diabetes mellitus: Secondary | ICD-10-CM

## 2021-04-22 DIAGNOSIS — G319 Degenerative disease of nervous system, unspecified: Secondary | ICD-10-CM | POA: Diagnosis not present

## 2021-04-22 DIAGNOSIS — I5032 Chronic diastolic (congestive) heart failure: Secondary | ICD-10-CM

## 2021-04-22 DIAGNOSIS — R627 Adult failure to thrive: Secondary | ICD-10-CM | POA: Diagnosis not present

## 2021-04-22 DIAGNOSIS — Z452 Encounter for adjustment and management of vascular access device: Secondary | ICD-10-CM | POA: Diagnosis not present

## 2021-04-22 DIAGNOSIS — Z800 Family history of malignant neoplasm of digestive organs: Secondary | ICD-10-CM

## 2021-04-22 DIAGNOSIS — L89154 Pressure ulcer of sacral region, stage 4: Secondary | ICD-10-CM | POA: Diagnosis present

## 2021-04-22 DIAGNOSIS — Z7189 Other specified counseling: Secondary | ICD-10-CM | POA: Diagnosis not present

## 2021-04-22 DIAGNOSIS — Z7901 Long term (current) use of anticoagulants: Secondary | ICD-10-CM

## 2021-04-22 LAB — CBC WITH DIFFERENTIAL/PLATELET
Abs Immature Granulocytes: 0 10*3/uL (ref 0.00–0.07)
Basophils Absolute: 0 10*3/uL (ref 0.0–0.1)
Basophils Relative: 0 %
Eosinophils Absolute: 4.2 10*3/uL — ABNORMAL HIGH (ref 0.0–0.5)
Eosinophils Relative: 37 %
HCT: 30.3 % — ABNORMAL LOW (ref 39.0–52.0)
Hemoglobin: 9.4 g/dL — ABNORMAL LOW (ref 13.0–17.0)
Lymphocytes Relative: 16 %
Lymphs Abs: 1.8 10*3/uL (ref 0.7–4.0)
MCH: 29.7 pg (ref 26.0–34.0)
MCHC: 31 g/dL (ref 30.0–36.0)
MCV: 95.6 fL (ref 80.0–100.0)
Monocytes Absolute: 1 10*3/uL (ref 0.1–1.0)
Monocytes Relative: 9 %
Neutro Abs: 4.3 10*3/uL (ref 1.7–7.7)
Neutrophils Relative %: 38 %
Platelets: 382 10*3/uL (ref 150–400)
RBC: 3.17 MIL/uL — ABNORMAL LOW (ref 4.22–5.81)
RDW: 14 % (ref 11.5–15.5)
WBC: 11.3 10*3/uL — ABNORMAL HIGH (ref 4.0–10.5)
nRBC: 0 % (ref 0.0–0.2)
nRBC: 0 /100 WBC

## 2021-04-22 LAB — CSF CELL COUNT WITH DIFFERENTIAL
RBC Count, CSF: 1 /mm3 — ABNORMAL HIGH
RBC Count, CSF: 1 /mm3 — ABNORMAL HIGH
Tube #: 1
Tube #: 4
WBC, CSF: 1 /mm3 (ref 0–5)
WBC, CSF: 3 /mm3 (ref 0–5)

## 2021-04-22 LAB — RESP PANEL BY RT-PCR (FLU A&B, COVID) ARPGX2
Influenza A by PCR: NEGATIVE
Influenza B by PCR: NEGATIVE
SARS Coronavirus 2 by RT PCR: NEGATIVE

## 2021-04-22 LAB — I-STAT ARTERIAL BLOOD GAS, ED
Acid-Base Excess: 4 mmol/L — ABNORMAL HIGH (ref 0.0–2.0)
Bicarbonate: 26.6 mmol/L (ref 20.0–28.0)
Calcium, Ion: 1.19 mmol/L (ref 1.15–1.40)
HCT: 22 % — ABNORMAL LOW (ref 39.0–52.0)
Hemoglobin: 7.5 g/dL — ABNORMAL LOW (ref 13.0–17.0)
O2 Saturation: 100 %
Potassium: 4 mmol/L (ref 3.5–5.1)
Sodium: 141 mmol/L (ref 135–145)
TCO2: 27 mmol/L (ref 22–32)
pCO2 arterial: 29.6 mmHg — ABNORMAL LOW (ref 32.0–48.0)
pH, Arterial: 7.561 — ABNORMAL HIGH (ref 7.350–7.450)
pO2, Arterial: 160 mmHg — ABNORMAL HIGH (ref 83.0–108.0)

## 2021-04-22 LAB — COMPREHENSIVE METABOLIC PANEL
ALT: 24 U/L (ref 0–44)
AST: 27 U/L (ref 15–41)
Albumin: 2.7 g/dL — ABNORMAL LOW (ref 3.5–5.0)
Alkaline Phosphatase: 62 U/L (ref 38–126)
Anion gap: 8 (ref 5–15)
BUN: 31 mg/dL — ABNORMAL HIGH (ref 8–23)
CO2: 30 mmol/L (ref 22–32)
Calcium: 9.2 mg/dL (ref 8.9–10.3)
Chloride: 100 mmol/L (ref 98–111)
Creatinine, Ser: 1.13 mg/dL (ref 0.61–1.24)
GFR, Estimated: 59 mL/min — ABNORMAL LOW (ref >60)
Glucose, Bld: 151 mg/dL — ABNORMAL HIGH (ref 70–99)
Potassium: 5.7 mmol/L — ABNORMAL HIGH (ref 3.5–5.1)
Sodium: 138 mmol/L (ref 135–145)
Total Bilirubin: 0.4 mg/dL (ref 0.3–1.2)
Total Protein: 6.4 g/dL — ABNORMAL LOW (ref 6.5–8.1)

## 2021-04-22 LAB — I-STAT CHEM 8, ED
BUN: 41 mg/dL — ABNORMAL HIGH (ref 8–23)
Calcium, Ion: 1.18 mmol/L (ref 1.15–1.40)
Chloride: 100 mmol/L (ref 98–111)
Creatinine, Ser: 1.1 mg/dL (ref 0.61–1.24)
Glucose, Bld: 149 mg/dL — ABNORMAL HIGH (ref 70–99)
HCT: 29 % — ABNORMAL LOW (ref 39.0–52.0)
Hemoglobin: 9.9 g/dL — ABNORMAL LOW (ref 13.0–17.0)
Potassium: 5.6 mmol/L — ABNORMAL HIGH (ref 3.5–5.1)
Sodium: 138 mmol/L (ref 135–145)
TCO2: 32 mmol/L (ref 22–32)

## 2021-04-22 LAB — PROTIME-INR
INR: 1.1 (ref 0.8–1.2)
Prothrombin Time: 14.1 seconds (ref 11.4–15.2)

## 2021-04-22 LAB — GRAM STAIN: Gram Stain: NONE SEEN

## 2021-04-22 LAB — TROPONIN I (HIGH SENSITIVITY)
Troponin I (High Sensitivity): 8 ng/L (ref <18)
Troponin I (High Sensitivity): 11 ng/L (ref <18)

## 2021-04-22 LAB — GLUCOSE, CAPILLARY: Glucose-Capillary: 178 mg/dL — ABNORMAL HIGH (ref 70–99)

## 2021-04-22 LAB — LACTIC ACID, PLASMA
Lactic Acid, Venous: 3.7 mmol/L (ref 0.5–1.9)
Lactic Acid, Venous: 2.3 mmol/L (ref 0.5–1.9)

## 2021-04-22 MED ORDER — FENTANYL BOLUS VIA INFUSION: 25.0000 ug | INTRAVENOUS | Status: DC | PRN

## 2021-04-22 MED ORDER — SODIUM CHLORIDE 0.9 % IV SOLN
2000.0000 mg | Freq: Once | INTRAVENOUS | Status: AC
  Administered 2021-04-22: 2000 mg via INTRAVENOUS
  Filled 2021-04-22: qty 20

## 2021-04-22 MED ORDER — ALBUTEROL SULFATE (2.5 MG/3ML) 0.083% IN NEBU
2.5000 mg | INHALATION_SOLUTION | RESPIRATORY_TRACT | Status: DC | PRN
  Administered 2021-04-24 – 2021-04-25 (×2): 2.5 mg via RESPIRATORY_TRACT
  Filled 2021-04-22 (×2): qty 3

## 2021-04-22 MED ORDER — FENTANYL 2500MCG IN NS 250ML (10MCG/ML) PREMIX INFUSION
25.0000 ug/h | INTRAVENOUS | Status: DC
  Administered 2021-04-22: 25 ug/h via INTRAVENOUS
  Filled 2021-04-22: qty 250

## 2021-04-22 MED ORDER — LORAZEPAM 2 MG/ML IJ SOLN
INTRAMUSCULAR | Status: AC
  Administered 2021-04-22: 2 mg via INTRAVENOUS
  Filled 2021-04-22: qty 1

## 2021-04-22 MED ORDER — LORAZEPAM 2 MG/ML IJ SOLN: 2.0000 mg | Freq: Once | INTRAMUSCULAR | Status: AC

## 2021-04-22 MED ORDER — LIDOCAINE HCL (PF) 1 % IJ SOLN: 5.0000 mL | Freq: Once | INTRAMUSCULAR | Status: AC

## 2021-04-22 MED ORDER — HEPARIN SODIUM (PORCINE) 5000 UNIT/ML IJ SOLN
5000.0000 [IU] | Freq: Three times a day (TID) | INTRAMUSCULAR | Status: DC
  Administered 2021-04-23 – 2021-05-02 (×28): 5000 [IU] via SUBCUTANEOUS
  Filled 2021-04-22 (×28): qty 1

## 2021-04-22 MED ORDER — IPRATROPIUM-ALBUTEROL 0.5-2.5 (3) MG/3ML IN SOLN
3.0000 mL | Freq: Four times a day (QID) | RESPIRATORY_TRACT | Status: DC
  Administered 2021-04-23 – 2021-04-24 (×6): 3 mL via RESPIRATORY_TRACT
  Filled 2021-04-22 (×7): qty 3

## 2021-04-22 MED ORDER — LIDOCAINE HCL (PF) 1 % IJ SOLN
INTRAMUSCULAR | Status: AC
  Administered 2021-04-22: 5 mL
  Filled 2021-04-22: qty 5

## 2021-04-22 MED ORDER — DEXTROSE 5 % IV SOLN
10.0000 mg/kg | Freq: Two times a day (BID) | INTRAVENOUS | Status: DC
  Administered 2021-04-22 – 2021-04-24 (×5): 795 mg via INTRAVENOUS
  Filled 2021-04-22 (×7): qty 15.9

## 2021-04-22 MED ORDER — FAMOTIDINE 20 MG IN NS 100 ML IVPB
20.0000 mg | INTRAVENOUS | Status: DC
  Administered 2021-04-23 – 2021-04-25 (×3): 20 mg via INTRAVENOUS
  Filled 2021-04-22 (×5): qty 100

## 2021-04-22 MED ORDER — BUDESONIDE 0.25 MG/2ML IN SUSP
0.2500 mg | Freq: Two times a day (BID) | RESPIRATORY_TRACT | Status: DC
  Administered 2021-04-23 – 2021-05-02 (×18): 0.25 mg via RESPIRATORY_TRACT
  Filled 2021-04-22 (×19): qty 2

## 2021-04-22 MED ORDER — POLYETHYLENE GLYCOL 3350 17 G PO PACK: 17.0000 g | PACK | Freq: Every day | ORAL | Status: DC | PRN

## 2021-04-22 MED ORDER — DOCUSATE SODIUM 50 MG/5ML PO LIQD
100.0000 mg | Freq: Two times a day (BID) | ORAL | Status: DC
  Administered 2021-04-23 – 2021-04-24 (×2): 100 mg
  Filled 2021-04-22 (×3): qty 10

## 2021-04-22 MED ORDER — ROCURONIUM BROMIDE 50 MG/5ML IV SOLN
100.0000 mg | Freq: Once | INTRAVENOUS | Status: AC
  Administered 2021-04-22: 100 mg via INTRAVENOUS

## 2021-04-22 MED ORDER — DOCUSATE SODIUM 50 MG/5ML PO LIQD
100.0000 mg | Freq: Two times a day (BID) | ORAL | Status: DC | PRN
Start: 2021-04-22 — End: 2021-04-22

## 2021-04-22 MED ORDER — ETOMIDATE 2 MG/ML IV SOLN
20.0000 mg | Freq: Once | INTRAVENOUS | Status: AC
  Administered 2021-04-22: 20 mg via INTRAVENOUS

## 2021-04-22 MED ORDER — FENTANYL CITRATE PF 50 MCG/ML IJ SOSY
50.0000 ug | PREFILLED_SYRINGE | Freq: Once | INTRAMUSCULAR | Status: DC
Start: 2021-04-22 — End: 2021-04-24

## 2021-04-22 MED ORDER — ORAL CARE MOUTH RINSE
15.0000 mL | OROMUCOSAL | Status: DC
  Administered 2021-04-23 – 2021-04-24 (×16): 15 mL via OROMUCOSAL

## 2021-04-22 MED ORDER — SODIUM CHLORIDE 0.9 % IV SOLN: INTRAVENOUS | Status: DC

## 2021-04-22 MED ORDER — CHLORHEXIDINE GLUCONATE CLOTH 2 % EX PADS
6.0000 | MEDICATED_PAD | Freq: Every day | CUTANEOUS | Status: DC
  Administered 2021-04-23 – 2021-05-01 (×9): 6 via TOPICAL

## 2021-04-22 MED ORDER — CHLORHEXIDINE GLUCONATE 0.12% ORAL RINSE (MEDLINE KIT)
15.0000 mL | Freq: Two times a day (BID) | OROMUCOSAL | Status: DC
  Administered 2021-04-23 – 2021-05-01 (×18): 15 mL via OROMUCOSAL

## 2021-04-22 MED ORDER — SODIUM CHLORIDE 0.9 % IV BOLUS
1000.0000 mL | Freq: Once | INTRAVENOUS | Status: AC
  Administered 2021-04-22: 1000 mL via INTRAVENOUS

## 2021-04-22 MED ORDER — INSULIN ASPART 100 UNIT/ML IJ SOLN
0.0000 [IU] | INTRAMUSCULAR | Status: DC
  Administered 2021-04-23 – 2021-04-24 (×4): 1 [IU] via SUBCUTANEOUS

## 2021-04-22 NOTE — Progress Notes (Signed)
Started cEEG study.

## 2021-04-22 NOTE — Procedures (Addendum)
Routine EEG Report  [name] is a 87 y.o. male with a history of seizures who is undergoing an EEG to evaluate for seizures.  Report: This EEG was acquired with electrodes placed according to the International 10-20 electrode system (including Fp1, Fp2, F3, F4, C3, C4, P3, P4, O1, O2, T3, T4, T5, T6, A1, A2, Fz, Cz, Pz). The following electrodes were missing or displaced: none.  The occipital dominant rhythm was 3-4 Hz with overriding beta. This activity is reactive to stimulation. Sleep architecture was not identified. There was no focal slowing. There were no interictal epileptiform discharges. There were no electrographic seizures identified. Photic stimulation and hyperventilation were not performed.   Impression and clinical corrrelation: This EEG was obtained while sedated and is abnormal due to severe diffuse slowing indicating a global cerebral dysfunction.  Su Monks, MD [facility] [phone]  If 7pm- 7am, please page neurology on call as listed in [place].

## 2021-04-22 NOTE — Progress Notes (Signed)
RT and RN took Pt to CT. No complications and Pts vitals are stable.

## 2021-04-22 NOTE — ED Notes (Signed)
Unable to draw 2nd blood cultures and the rest of the labs. 2 Rns attempted IV multiple times with no success. IV team consult placed.

## 2021-04-22 NOTE — ED Notes (Signed)
Patient transported to CT 

## 2021-04-22 NOTE — ED Notes (Signed)
Back from CT

## 2021-04-22 NOTE — ED Notes (Signed)
Phlebotomist attempted to draw labs with no success.

## 2021-04-22 NOTE — H&P (Signed)
NAMEMaksymilian [name] MRN:  [id number] DOB:  1922/04/25 LOS: 0 ADMISSION DATE:  04/22/2021 DATE OF SERVICE:  04/22/2021  CHIEF COMPLAINT:  new onset seizures   HISTORY & PHYSICAL  History of Present Illness  This 87 y.o. African American male reformed smoker presented to the [facility] Emergency Department via EMS with complaints of new onset seizure disorder.  The patient was intubated in the ER for airway protection.  PCCM asked to admit.  He transferred to [facility] from the [place], where he presented for a follow up visit for evaluation of bullous skin lesions, skin tears and sacral decubitus.  During his visit, while undergoing examination, he was observed to suddenly develop seizure-like activity with pupils deviated to the right side (per the patient's daughter).  His seizure activity was treated with Versed 5 mg single dose by EMS, which reportedly resulted in suppression of seizure activity.  In the ER, however, he was noted to still have some facial twitching which was suspected to be ongoing seizure activity.  He received 3 additional doses of Ativan 2 mg IV and was loaded with Keppra.  Neurology consulted and following.  The patinet lives with his daughter, who is his primary caregiver.  She reports that the patient was at baseline in terms of mentation and activity (wheelchair-bound as a sequela of previous stroke) leading up to the onset of seizures today.  REVIEW OF SYSTEMS This patient is critically ill and cannot provide additional history nor review of systems due to endotracheally intubated.   Past Medical/Surgical/Social/Family History   Past Medical History:  Diagnosis Date   Arthritis    CAD (coronary artery disease)    s/p DES to LAD 06/2013   Dementia with behavioral disturbance (HCC)    Depression    Dyslipidemia    Glaucoma    Hyperglycemia 04/17/2014   Hyperlipidemia    Hypertension    Stroke ([place])    Type 2  diabetes mellitus ([place])     Past Surgical History:  Procedure Laterality Date   CARDIAC CATHETERIZATION  06/2013   DES to LAD by Dr Tamala Julian   Right Cataract      Social History   Tobacco Use   Smoking status: Former    Types: Cigarettes    Quit date: 08/02/1968    Years since quitting: 52.7   Smokeless tobacco: Never  Substance Use Topics   Alcohol use: No    Alcohol/week: 0.0 standard drinks    Family History  Problem Relation Age of Onset   Arthritis Mother    Breast cancer Mother    Arthritis Father    Dementia Father    Diabetes Other    Arthritis Other    Colon cancer Other    Stroke Other    Diabetes type II Sister    Suicidality Brother    Diabetes type I Brother    Diabetes type I Brother    Diabetes type I Brother    Diabetes type II Sister    Breast cancer Daughter    Emphysema Daughter      Procedures:  9/21 LP in ER, intubated in ER   Significant Diagnostic Tests:     Micro Data:   Results for orders placed or performed during the hospital encounter of 04/22/21  Resp Panel by RT-PCR (Flu A&B, Covid) Nasopharyngeal Swab     Status: None   Collection Time: 04/22/21  5:47 PM   Specimen: Nasopharyngeal  Swab; Nasopharyngeal(NP) swabs in vial transport medium  Result Value Ref Range Status   SARS Coronavirus 2 by RT PCR NEGATIVE NEGATIVE Final    Comment: (NOTE) SARS-CoV-2 target nucleic acids are NOT DETECTED.  The SARS-CoV-2 RNA is generally detectable in upper respiratory specimens during the acute phase of infection. The lowest concentration of SARS-CoV-2 viral copies this assay can detect is 138 copies/mL. A negative result does not preclude SARS-Cov-2 infection and should not be used as the sole basis for treatment or other patient management decisions. A negative result may occur with  improper specimen collection/handling, submission of specimen other than nasopharyngeal swab, presence of viral mutation(s) within the areas targeted by  this assay, and inadequate number of viral copies(<138 copies/mL). A negative result must be combined with clinical observations, patient history, and epidemiological information. The expected result is Negative.  Fact Sheet for Patients:  EntrepreneurPulse.com.au  Fact Sheet for Healthcare Providers:  IncredibleEmployment.be  This test is no t yet approved or cleared by the [place] FDA and  has been authorized for detection and/or diagnosis of SARS-CoV-2 by FDA under an Emergency Use Authorization (EUA). This EUA will remain  in effect (meaning this test can be used) for the duration of the COVID-19 declaration under Section 564(b)(1) of the Act, 21 U.S.C.section 360bbb-3(b)(1), unless the authorization is terminated  or revoked sooner.       Influenza A by PCR NEGATIVE NEGATIVE Final   Influenza B by PCR NEGATIVE NEGATIVE Final    Comment: (NOTE) The Xpert Xpress SARS-CoV-2/FLU/RSV plus assay is intended as an aid in the diagnosis of influenza from Nasopharyngeal swab specimens and should not be used as a sole basis for treatment. Nasal washings and aspirates are unacceptable for Xpert Xpress SARS-CoV-2/FLU/RSV testing.  Fact Sheet for Patients: EntrepreneurPulse.com.au  Fact Sheet for Healthcare Providers: IncredibleEmployment.be  This test is not yet approved or cleared by the [place] FDA and has been authorized for detection and/or diagnosis of SARS-CoV-2 by FDA under an Emergency Use Authorization (EUA). This EUA will remain in effect (meaning this test can be used) for the duration of the COVID-19 declaration under Section 564(b)(1) of the Act, 21 U.S.C. section 360bbb-3(b)(1), unless the authorization is terminated or revoked.  Performed at [facility], [place] [address] [place], [place] [address]       Antimicrobials:  Acyclovir (9/21>>)    Interim history/subjective:      Objective   BP 106/60   Pulse (!) 109   Temp (!) 96.9 F (36.1 C) (Rectal)   Resp 14   Ht 6' (1.829 m)   Wt 79.4 kg   SpO2 99%   BMI 23.74 kg/m     Filed Weights   04/22/21 1620  Weight: 79.4 kg    Intake/Output Summary (Last 24 hours) at 04/22/2021 2202 Last data filed at 04/22/2021 1823 Gross per 24 hour  Intake 1200 ml  Output --  Net 1200 ml    Vent Mode: PRVC FiO2 (%):  [30 %-40 %] 30 % Set Rate:  [14 bmp-18 bmp] 14 bmp Vt Set:  [620 mL] 620 mL PEEP:  [5 cmH20] 5 cmH20 Plateau Pressure:  [23 cmH20] 23 cmH20   Examination: GENERAL: Intubated. Sedated, on fentanyl. Cachectic. No acute distress. HEAD: normocephalic, atraumatic EYE: PERRLA, EOM intact, no scleral icterus, no pallor. THROAT/ORAL CAVITY: Normal dentition. No oral thrush. No exudate. Mucous membranes are moist. No tonsillar enlargement. ETT in situ. NECK: supple, no thyromegaly, no JVD, no lymphadenopathy. Trachea  midline. CHEST/LUNG: symmetric in development and expansion. Good air entry. Low-pitched wheezes throughout right lung.  Left lung clear.  Scattered rales. HEART: Regular S1 and S2 without murmur, rub or gallop. ABDOMEN: soft, nontender, nondistended. Normoactive bowel sounds. No rebound. No guarding. No hepatosplenomegaly. EXTREMITIES: Edema: none. No cyanosis. No clubbing. 2+ DP pulses LYMPHATIC: no cervical/axillary/inguinal lymph nodes appreciated MUSCULOSKELETAL: No point tenderness. No bulk atrophy. Joints: bulla on L 3rd digit PIP.  SKIN:  Unroofed skin bullae and skin tears in the left axillary region.  Unrookfed bullous lesion in the inguinal region.  Pressure sore at sacrum. NEUROLOGIC: No visible seizure activity.  EEG in progress.  Doll's eyes intact. Corneal reflex intact. Spontaneous respirations intact. Facial symmetry. Babinski absent. Gait was not assessed.   Resolved Hospital Problem list      Assessment & Plan:   ASSESSMENT/PLAN:  ASSESSMENT (included in the  Hospital Problem List)  Principal Problem:   Status epilepticus ([place]) Active Problems:   Centrilobular emphysema (HCC)   Multiple skin tears   Skin bulla   Abnormal CXR   Type 2 diabetes, controlled, with peripheral circulatory disorder (HCC)   Normocytic anemia   Endotracheally intubated   By systems: PULMONARY Endotracheally intubated Abnormal CXR Centrilobular emphysema Titrate vent settings based on ABG results Titrate sedation for RASS -2 Aerosols: DuoNeb/Pulmicort, albuterol PRN Repeat CXR in am Trach aspirate for Gram stain, C/S   CARDIOVASCULAR Essential hypertension Continue metoprolol (home medication)   RENAL: No acute issues Monitor urine output   GASTROINTESTINAL GI PROPHYLAXIS: famotidine   HEMATOLOGIC Normocytic anemia Mild leukocytosis of unknown clinical significance Monitor CBC daily DVT PROPHYLAXIS: heparin   INFECTIOUS: No acute issues   ENDOCRINE Type 2 diabetes, by history, not on maintenance medications Check HgbA1c Sliding scale insulin for now   NEUROLOGIC Status epilepticus, new onset seizure Neurology input appreciated Continue Keppra Awaiting EEG interpretation  SKIN/MUSCULOSKELETAL Bullous skin lesions WITHOUT purulent discharge Photographic documentation in chart Wound care consult Monitor for progression  PLAN/RECOMMENDATIONS  Admit to ICU under my service (Attending: Renee Pain, MD) with the diagnoses highlighted above in the active Hospital Problem List (ASSESSMENT).     My assessment, plan of care, findings, medications, side effects, etc. were discussed with: nurse and patient's next of kin (answered all questions to his/her satisfaction).   Best practice:  Diet: tube feeds (Clinical Dietician consult ordered) Pain/Anxiety/Delirium protocol (if indicated): YES VAP protocol (if indicated): YES DVT prophylaxis: heparin GI prophylaxis: famotidine Glucose control: Sliding scale insulin Mobility/Activity:  bedrest   Code Status: Full Code Family Communication:  patient's family (daughter at bedside) Disposition: admit to ICU   Labs   CBC: Recent Labs  Lab 04/22/21 1624 04/22/21 1628 04/22/21 1735  WBC 11.3*  --   --   NEUTROABS 4.3  --   --   HGB 9.4* 9.9* 7.5*  HCT 30.3* 29.0* 22.0*  MCV 95.6  --   --   PLT 382  --   --     Basic Metabolic Panel: Recent Labs  Lab 04/22/21 1624 04/22/21 1628 04/22/21 1735  NA 138 138 141  K 5.7* 5.6* 4.0  CL 100 100  --   CO2 30  --   --   GLUCOSE 151* 149*  --   BUN 31* 41*  --   CREATININE 1.13 1.10  --   CALCIUM 9.2  --   --    GFR: Estimated Creatinine Clearance: 42.1 mL/min (by C-G formula based on SCr of 1.1 mg/dL). Recent Labs  Lab 04/22/21 1624 04/22/21 1824  WBC 11.3*  --   LATICACIDVEN 3.7* 2.3*    Liver Function Tests: Recent Labs  Lab 04/22/21 1624  AST 27  ALT 24  ALKPHOS 62  BILITOT 0.4  PROT 6.4*  ALBUMIN 2.7*   No results for input(s): LIPASE, AMYLASE in the last 168 hours. No results for input(s): AMMONIA in the last 168 hours.  ABG    Component Value Date/Time   PHART 7.561 (H) 04/22/2021 1735   PCO2ART 29.6 (L) 04/22/2021 1735   PO2ART 160 (H) 04/22/2021 1735   HCO3 26.6 04/22/2021 1735   TCO2 27 04/22/2021 1735   O2SAT 100.0 04/22/2021 1735     Coagulation Profile: Recent Labs  Lab 04/22/21 1624  INR 1.1    Cardiac Enzymes: No results for input(s): CKTOTAL, CKMB, CKMBINDEX, TROPONINI in the last 168 hours.  HbA1C: Hemoglobin A1C  Date/Time Value Ref Range Status  11/22/2019 12:00 AM 7.1  Final   Hgb A1c MFr Bld  Date/Time Value Ref Range Status  06/02/2019 02:50 AM 6.3 (H) 4.8 - 5.6 % Final    Comment:    (NOTE) Pre diabetes:          5.7%-6.4% Diabetes:              >6.4% Glycemic control for   <7.0% adults with diabetes   12/01/2018 10:32 AM 6.3 (H) <5.7 % of total Hgb Final    Comment:    For someone without known diabetes, a hemoglobin  A1c value between 5.7% and  6.4% is consistent with prediabetes and should be confirmed with a  follow-up test. . For someone with known diabetes, a value <7% indicates that their diabetes is well controlled. A1c targets should be individualized based on duration of diabetes, age, comorbid conditions, and other considerations. . This assay result is consistent with an increased risk of diabetes. . Currently, no consensus exists regarding use of hemoglobin A1c for diagnosis of diabetes for children. .     CBG: Recent Labs  Lab 04/22/21 1518  GLUCAP 178*     Past Medical History   Past Medical History:  Diagnosis Date   Arthritis    CAD (coronary artery disease)    s/p DES to LAD 06/2013   Dementia with behavioral disturbance (HCC)    Depression    Dyslipidemia    Glaucoma    Hyperglycemia 04/17/2014   Hyperlipidemia    Hypertension    Stroke ([place])    Type 2 diabetes mellitus [facility])       Surgical History    Past Surgical History:  Procedure Laterality Date   CARDIAC CATHETERIZATION  06/2013   DES to LAD by Dr Tamala Julian   Right Cataract        Social History   Social History   Socioeconomic History   Marital status: Widowed    Spouse name: Not on file   Number of children: 4   Years of education: Not on file   Highest education level: Not on file  Occupational History   Occupation: Retired  Tobacco Use   Smoking status: Former    Types: Cigarettes    Quit date: 08/02/1968    Years since quitting: 52.7   Smokeless tobacco: Never  Vaping Use   Vaping Use: Never used  Substance and Sexual Activity   Alcohol use: No    Alcohol/week: 0.0 standard drinks   Drug use: No   Sexual activity: Not Currently  Other Topics Concern  Not on file  Social History Narrative   Retired Warden/ranger   widowed, lives with dtr and youngest son   5 kids from first marriage   Social Determinants of Health   Financial Resource Strain: Not on file  Food Insecurity: Not on file   Transportation Needs: Not on file  Physical Activity: Not on file  Stress: Not on file  Social Connections: Not on file      Family History    Family History  Problem Relation Age of Onset   Arthritis Mother    Breast cancer Mother    Arthritis Father    Dementia Father    Diabetes Other    Arthritis Other    Colon cancer Other    Stroke Other    Diabetes type II Sister    Suicidality Brother    Diabetes type I Brother    Diabetes type I Brother    Diabetes type I Brother    Diabetes type II Sister    Breast cancer Daughter    Emphysema Daughter    family history includes Arthritis in his father, mother, and another family member; Breast cancer in his daughter and mother; Colon cancer in an other family member; Dementia in his father; Diabetes in an other family member; Diabetes type I in his brother, brother, and brother; Diabetes type II in his sister and sister; Emphysema in his daughter; Stroke in an other family member; Suicidality in his brother.    Allergies Allergies  Allergen Reactions   Lisinopril Cough      Current Medications  Current Facility-Administered Medications:    0.9 %  sodium chloride infusion, , Intravenous, Continuous, Heloise Purpura [facility], Last Rate: 125 mL/hr at 04/22/21 1945, New Bag at 04/22/21 1945   acyclovir (ZOVIRAX) 795 mg in dextrose 5 % 150 mL IVPB, 10 mg/kg, Intravenous, Q12H, Heloise Purpura, RPH, Stopped at 04/22/21 2130   fentaNYL (SUBLIMAZE) bolus via infusion 25 mcg, 25 mcg, Intravenous, Q1H PRN, Hong, Greggory Brandy, MD   fentaNYL (SUBLIMAZE) injection 50 mcg, 50 mcg, Intravenous, Once, [place], Greggory Brandy, MD   fentaNYL 2562mcg in NS 248mL (75mcg/ml) infusion-PREMIX, 25-400 mcg/hr, Intravenous, Continuous, Hong, Greggory Brandy, MD, Last Rate: 17.5 mL/hr at 04/22/21 2030, 175 mcg/hr at 04/22/21 2030  Current Outpatient Medications:    aspirin EC 81 MG tablet, Take 1 tablet (81 mg total) by mouth daily., Disp: 150 tablet, Rfl: 2    escitalopram (LEXAPRO) 10 MG tablet, Take 5 mg by mouth daily., Disp: , Rfl:    ferrous sulfate 220 (44 Fe) MG/5ML solution, Take 220 mg by mouth daily., Disp: , Rfl:    Loratadine 10 MG CAPS, Take 1 capsule (10 mg total) by mouth daily., Disp: 20 capsule, Rfl: 0   losartan (COZAAR) 100 MG tablet, Take 50 mg by mouth daily., Disp: , Rfl:    metoprolol tartrate (LOPRESSOR) 50 MG tablet, Take 25 mg by mouth 2 (two) times daily., Disp: , Rfl:    Multiple Vitamins-Minerals (SENTRY SENIOR) TABS, Take 1 tablet by mouth daily., Disp: 30 tablet, Rfl: 0   Accu-Chek Softclix Lancets lancets, Use to check blood sugar three times daily. Dx: E11.51, Disp: 300 each, Rfl: 3   acetaminophen (TYLENOL) 500 MG tablet, Take 500 mg by mouth every 6 (six) hours as needed for moderate pain., Disp: , Rfl:    albuterol (VENTOLIN HFA) 108 (90 Base) MCG/ACT inhaler, Inhale 2 puffs into the lungs every 4 (four) hours as needed for wheezing or  shortness of breath. INHALE TWO PUFFS EVERY 4-6 HOURS AS NEEDED FOR DIFFICULTY BREATHING,WHEEZING OR COUGH (Patient taking differently: Inhale 2 puffs into the lungs every 4 (four) hours as needed for wheezing or shortness of breath. ), Disp: 18 g, Rfl: 0   Alcohol Swabs (SURE-PREP ALCOHOL PREP) 70 % PADS, 1 each by Does not apply route as needed., Disp: 300 each, Rfl: 3   amLODipine (NORVASC) 5 MG tablet, Take 1 tablet (5 mg total) by mouth daily., Disp: 90 tablet, Rfl: 1   apixaban (ELIQUIS) 5 MG TABS tablet, Take 1 tablet (5 mg total) by mouth 2 (two) times daily., Disp: 180 tablet, Rfl: 1   Blood Glucose Monitoring Suppl (ACCU-CHEK AVIVA) device, Use to test blood sugar three times daily. Dx E11.51, Disp: 1 each, Rfl: 0   escitalopram (LEXAPRO) 5 MG tablet, TAKE 1 TABLET EVERY DAY (Patient not taking: Reported on 04/22/2021), Disp: 90 tablet, Rfl: 1   feeding supplement, ENSURE ENLIVE, (ENSURE ENLIVE) LIQD, Take 237 mLs by mouth 2 (two) times daily between meals., Disp: 237 mL, Rfl: 12    glucose blood (ACCU-CHEK AVIVA PLUS) test strip, Check blood sugar three times daily as directed E11.51, Disp: 300 strip, Rfl: 11   hydrochlorothiazide (HYDRODIURIL) 25 MG tablet, TAKE 1 TABLET EVERY DAY (Patient taking differently: Take 25 mg by mouth daily. ), Disp: 90 tablet, Rfl: 1   insulin glargine (LANTUS) 100 UNIT/ML injection, Inject 8 Units into the skin daily. , Disp: , Rfl:    Insulin Pen Needle (B-D UF III MINI PEN NEEDLES) 31G X 5 MM MISC, Use to give insulin., Disp: 270 each, Rfl: 11   NAMZARIC 28-10 MG CP24, TAKE 1 CAPSULE EVERY DAY (Patient taking differently: Take 1 capsule by mouth daily. ), Disp: 90 capsule, Rfl: 1   simvastatin (ZOCOR) 20 MG tablet, Take 1 tablet (20 mg total) by mouth daily., Disp: 20 tablet, Rfl: 0   tamsulosin (FLOMAX) 0.4 MG CAPS capsule, Take 1 capsule (0.4 mg total) by mouth daily after supper., Disp: 30 capsule, Rfl: 0   Home Medications  Prior to Admission medications   Medication Sig Start Date End Date Taking? Authorizing Provider  aspirin EC 81 MG tablet Take 1 tablet (81 mg total) by mouth daily. 06/13/19  Yes Plunkett, Loree Fee, MD  escitalopram (LEXAPRO) 10 MG tablet Take 5 mg by mouth daily.   Yes [provider]  ferrous sulfate 220 (44 Fe) MG/5ML solution Take 220 mg by mouth daily.   Yes [provider]  Loratadine 10 MG CAPS Take 1 capsule (10 mg total) by mouth daily. 02/08/20  Yes Reed, Tiffany L, DO  losartan (COZAAR) 100 MG tablet Take 50 mg by mouth daily.   Yes [provider]  metoprolol tartrate (LOPRESSOR) 50 MG tablet Take 25 mg by mouth 2 (two) times daily.   Yes [provider]  Multiple Vitamins-Minerals (SENTRY SENIOR) TABS Take 1 tablet by mouth daily. 06/13/19  Yes Plunkett, Loree Fee, MD  Accu-Chek Softclix Lancets lancets Use to check blood sugar three times daily. Dx: E11.51 12/28/19   Reed, Tiffany L, DO  acetaminophen (TYLENOL) 500 MG tablet Take 500 mg by mouth every 6 (six) hours as  needed for moderate pain.    [provider]  albuterol (VENTOLIN HFA) 108 (90 Base) MCG/ACT inhaler Inhale 2 puffs into the lungs every 4 (four) hours as needed for wheezing or shortness of breath. INHALE TWO PUFFS EVERY 4-6 HOURS AS NEEDED FOR DIFFICULTY BREATHING,WHEEZING OR COUGH Patient  taking differently: Inhale 2 puffs into the lungs every 4 (four) hours as needed for wheezing or shortness of breath.  06/13/19   Blanchie Dessert, MD  Alcohol Swabs (SURE-PREP ALCOHOL PREP) 70 % PADS 1 each by Does not apply route as needed. 12/28/19   Reed, Tiffany L, DO  amLODipine (NORVASC) 5 MG tablet Take 1 tablet (5 mg total) by mouth daily. 11/12/19   Reed, Tiffany L, DO  apixaban (ELIQUIS) 5 MG TABS tablet Take 1 tablet (5 mg total) by mouth 2 (two) times daily. 11/12/19   Reed, Tiffany L, DO  Blood Glucose Monitoring Suppl (ACCU-CHEK AVIVA) device Use to test blood sugar three times daily. Dx E11.51 08/21/19   Reed, Tiffany L, DO  escitalopram (LEXAPRO) 5 MG tablet TAKE 1 TABLET EVERY DAY Patient not taking: Reported on 04/22/2021 11/05/19   Hollace Kinnier L, DO  feeding supplement, ENSURE ENLIVE, (ENSURE ENLIVE) LIQD Take 237 mLs by mouth 2 (two) times daily between meals. 12/07/19   Hosie Poisson, MD  glucose blood (ACCU-CHEK AVIVA PLUS) test strip Check blood sugar three times daily as directed E11.51 12/28/19   Reed, Tiffany L, DO  hydrochlorothiazide (HYDRODIURIL) 25 MG tablet TAKE 1 TABLET EVERY DAY Patient taking differently: Take 25 mg by mouth daily.  11/05/19   Reed, Tiffany L, DO  insulin glargine (LANTUS) 100 UNIT/ML injection Inject 8 Units into the skin daily.     [provider]  Insulin Pen Needle (B-D UF III MINI PEN NEEDLES) 31G X 5 MM MISC Use to give insulin. 12/28/19   Reed, Tiffany L, DO  NAMZARIC 28-10 MG CP24 TAKE 1 CAPSULE EVERY DAY Patient taking differently: Take 1 capsule by mouth daily.  11/05/19   Reed, Tiffany L, DO  simvastatin (ZOCOR) 20 MG tablet Take 1 tablet (20  mg total) by mouth daily. 02/08/20   Reed, Tiffany L, DO  tamsulosin (FLOMAX) 0.4 MG CAPS capsule Take 1 capsule (0.4 mg total) by mouth daily after supper. 06/13/19   Blanchie Dessert, MD      Critical care time: 30 minutes.  The treatment and management of the patient's condition was required based on the threat of imminent deterioration. This time reflects time spent by the physician evaluating, providing care and managing the critically ill patient's care. The time was spent at the immediate bedside (or on the same floor/unit and dedicated to this patient's care). Time involved in separately billable procedures is NOT included int he critical care time indicated above. Family meeting and update time may be included above if and only if the patient is unable/incompetent to participate in clinical interview and/or decision making, and the discussion was necessary to determining treatment decisions.   Renee Pain, MD Board Certified by the ABIM, [place]

## 2021-04-22 NOTE — Consult Note (Addendum)
Neurology Consultation  Reason for Consult: Seizure, status epilepticus Referring Physician: Dr. Thamas Jaegers  CC: Seizures and status epilepticus  History is obtained from: Chart review  HPI: [name] is a 87 y.o. male past medical history of dementia with behavioral disturbance, depression, hyperlipidemia, hyperglycemia, prior history of stroke, diabetes, coronary artery disease, presented from a doctor's office, where he had seizure-like activity with facial twitching and right arm shaking.  Received 5 mg IM Versed by EMS with some resolution but on the way to the ER had another seizure.  No prior history of seizures.  In the ER required a total of 6 mg of Ativan-2 mg x 3. Had to be emergently intubated due to inability to protect airway.  The right arm and face twitching did subside to some point with the benzodiazepines before recurring prompting multiple benzodiazepines. Noted to have blisters along various parts of his body-both left and right leg for about 1 week. No other illness or sickness.  Not hypertensive on arrival   Previously seen neurology for right-sided weakness and prior history of strokes.  Prior head CT with chronic left frontal encephalomalacia.  ROS: Unable to obtain due to altered mental status.   Past Medical History:  Diagnosis Date   Arthritis    CAD (coronary artery disease)    s/p DES to LAD 06/2013   Dementia with behavioral disturbance (HCC)    Depression    Dyslipidemia    Glaucoma    Hyperglycemia 04/17/2014   Hyperlipidemia    Hypertension    Stroke ([place])    Type 2 diabetes mellitus ([place])     Family History  Problem Relation Age of Onset   Arthritis Mother    Breast cancer Mother    Arthritis Father    Dementia Father    Diabetes Other    Arthritis Other    Colon cancer Other    Stroke Other    Diabetes type II Sister    Suicidality Brother    Diabetes type I Brother    Diabetes type I Brother    Diabetes type I Brother     Diabetes type II Sister    Breast cancer Daughter    Emphysema Daughter      Social History:   reports that he quit smoking about 52 years ago. His smoking use included cigarettes. He has never used smokeless tobacco. He reports that he does not drink alcohol and does not use drugs.  Medications  Current Facility-Administered Medications:    fentaNYL (SUBLIMAZE) bolus via infusion 25 mcg, 25 mcg, Intravenous, Q1H PRN, Hong, Greggory Brandy, MD   fentaNYL (SUBLIMAZE) injection 50 mcg, 50 mcg, Intravenous, Once, [place], Greggory Brandy, MD   fentaNYL 2559mcg in NS 232mL (69mcg/ml) infusion-PREMIX, 25-400 mcg/hr, Intravenous, Continuous, Hong, Greggory Brandy, MD, Last Rate: 2.5 mL/hr at 04/22/21 1733, 25 mcg/hr at 04/22/21 1733  Current Outpatient Medications:    Accu-Chek Softclix Lancets lancets, Use to check blood sugar three times daily. Dx: E11.51, Disp: 300 each, Rfl: 3   acetaminophen (TYLENOL) 500 MG tablet, Take 500 mg by mouth every 6 (six) hours as needed for moderate pain., Disp: , Rfl:    albuterol (VENTOLIN HFA) 108 (90 Base) MCG/ACT inhaler, Inhale 2 puffs into the lungs every 4 (four) hours as needed for wheezing or shortness of breath. INHALE TWO PUFFS EVERY 4-6 HOURS AS NEEDED FOR DIFFICULTY BREATHING,WHEEZING OR COUGH (Patient taking differently: Inhale 2 puffs into the lungs every 4 (four) hours as needed for wheezing  or shortness of breath. ), Disp: 18 g, Rfl: 0   Alcohol Swabs (SURE-PREP ALCOHOL PREP) 70 % PADS, 1 each by Does not apply route as needed., Disp: 300 each, Rfl: 3   amLODipine (NORVASC) 5 MG tablet, Take 1 tablet (5 mg total) by mouth daily., Disp: 90 tablet, Rfl: 1   apixaban (ELIQUIS) 5 MG TABS tablet, Take 1 tablet (5 mg total) by mouth 2 (two) times daily., Disp: 180 tablet, Rfl: 1   aspirin EC 81 MG tablet, Take 1 tablet (81 mg total) by mouth daily., Disp: 150 tablet, Rfl: 2   Blood Glucose Monitoring Suppl (ACCU-CHEK AVIVA) device, Use to test blood sugar three times daily.  Dx E11.51, Disp: 1 each, Rfl: 0   escitalopram (LEXAPRO) 5 MG tablet, TAKE 1 TABLET EVERY DAY (Patient taking differently: Take 5 mg by mouth daily. ), Disp: 90 tablet, Rfl: 1   feeding supplement, ENSURE ENLIVE, (ENSURE ENLIVE) LIQD, Take 237 mLs by mouth 2 (two) times daily between meals., Disp: 237 mL, Rfl: 12   glucose blood (ACCU-CHEK AVIVA PLUS) test strip, Check blood sugar three times daily as directed E11.51, Disp: 300 strip, Rfl: 11   hydrochlorothiazide (HYDRODIURIL) 25 MG tablet, TAKE 1 TABLET EVERY DAY (Patient taking differently: Take 25 mg by mouth daily. ), Disp: 90 tablet, Rfl: 1   insulin glargine (LANTUS) 100 UNIT/ML injection, Inject 8 Units into the skin daily. , Disp: , Rfl:    Insulin Pen Needle (B-D UF III MINI PEN NEEDLES) 31G X 5 MM MISC, Use to give insulin., Disp: 270 each, Rfl: 11   Loratadine 10 MG CAPS, Take 1 capsule (10 mg total) by mouth daily., Disp: 20 capsule, Rfl: 0   Multiple Vitamins-Minerals (SENTRY SENIOR) TABS, Take 1 tablet by mouth daily., Disp: 30 tablet, Rfl: 0   NAMZARIC 28-10 MG CP24, TAKE 1 CAPSULE EVERY DAY (Patient taking differently: Take 1 capsule by mouth daily. ), Disp: 90 capsule, Rfl: 1   simvastatin (ZOCOR) 20 MG tablet, Take 1 tablet (20 mg total) by mouth daily., Disp: 20 tablet, Rfl: 0   tamsulosin (FLOMAX) 0.4 MG CAPS capsule, Take 1 capsule (0.4 mg total) by mouth daily after supper., Disp: 30 capsule, Rfl: 0   Exam: Current vital signs: BP 133/81   Pulse 84   Temp (!) 97.5 F (36.4 C) (Axillary)   Resp 14   Ht 6' (1.829 m)   Wt 79.4 kg   SpO2 100%   BMI 23.74 kg/m  Vital signs in last 24 hours: Temp:  [97.5 F (36.4 C)] 97.5 F (36.4 C) (09/21 1633) Pulse Rate:  [84-123] 84 (09/21 1800) Resp:  [14-25] 14 (09/21 1800) BP: (52-154)/(38-89) 133/81 (09/21 1800) SpO2:  [99 %-100 %] 100 % (09/21 1800) FiO2 (%):  [40 %] 40 % (09/21 1650) Weight:  [79.4 kg] 79.4 kg (09/21 1620) General: Sedated intubated HNT: Normocephalic  atraumatic Chest: Clear CVS: Regular rhythm Neurological exam-limited Sedated intubated-was also given paralytics for intubation Minimal pupillary reaction both eyes No spontaneous movements Breathing with the ventilator   Labs I have reviewed labs in epic and the results pertinent to this consultation are:  CBC    Component Value Date/Time   WBC 11.9 (H) 12/07/2019 0558   RBC 2.81 (L) 12/07/2019 0558   HGB 7.5 (L) 04/22/2021 1735   HCT 22.0 (L) 04/22/2021 1735   HCT 33.2 (L) 06/01/2019 2202   PLT 480 (H) 12/07/2019 0558   MCV 91.1 12/07/2019 0558   MCH 28.8  12/07/2019 0558   MCHC 31.6 12/07/2019 0558   RDW 14.2 12/07/2019 0558   LYMPHSABS 2.7 12/05/2019 0950   MONOABS 1.1 (H) 12/05/2019 0950   EOSABS 0.3 12/05/2019 0950   BASOSABS 0.0 12/05/2019 0950    CMP     Component Value Date/Time   NA 141 04/22/2021 1735   NA 142 11/22/2019 0000   K 4.0 04/22/2021 1735   CL 100 04/22/2021 1628   CO2 30 04/22/2021 1624   GLUCOSE 149 (H) 04/22/2021 1628   BUN 41 (H) 04/22/2021 1628   BUN 22 (A) 11/22/2019 0000   CREATININE 1.10 04/22/2021 1628   CREATININE 1.63 (H) 12/01/2018 1032   CALCIUM 9.2 04/22/2021 1624   PROT 6.4 (L) 04/22/2021 1624   ALBUMIN 2.7 (L) 04/22/2021 1624   AST 27 04/22/2021 1624   ALT 24 04/22/2021 1624   ALKPHOS 62 04/22/2021 1624   BILITOT 0.4 04/22/2021 1624   GFRNONAA 59 (L) 04/22/2021 1624   GFRNONAA 36 (L) 12/01/2018 1032   GFRAA >60 12/07/2019 0558   GFRAA 41 (L) 12/01/2018 1032    Lipid Panel     Component Value Date/Time   CHOL 134 06/02/2019 0250   TRIG 126 07/05/2019 1536   HDL 48 06/02/2019 0250   CHOLHDL 2.8 06/02/2019 0250   VLDL 12 06/02/2019 0250   LDLCALC 74 06/02/2019 0250   LDLCALC 89 12/01/2018 1032   LDLDIRECT 92.0 11/24/2015 0723     Imaging I have reviewed the images obtained:  CT-head-ordered and pending Prior head CTs-most recent from 06/01/2019 with no acute intracranial abnormality.  Stable remote infarcts  in the right and left frontal and left occipital lobes.  Remote right parietal hygroma/chronic subdural resolved since prior comparison exam.  Assessment:  87 year old with no prior history of seizures, other medical history as documented above with acute onset of right face and arm twitching requiring multiple doses of benzodiazepine eventually requiring intubation due to inability protect airway. Multiple doses of benzodiazepines to control the twitching with return of the twitching clinically that prompted multiple doses of benzodiazepines Given the history of rash, concern for viral encephalitis Other differentials include increased susceptibility to seizures in the setting of dementia versus some other CNS infection versus cortical stroke/ICH presenting as seizures.  Impression: New onset seizure Status epilepticus Concern for viral encephalitis R/o ischemic or hemorrhagic stroke Significant history of dementia-unclear baseline   Recommendations: Load Keppra IV 2 g x 1 Continue Keppra 500 twice daily Stat CT head Given the concern for viral encephalitis, cover with acyclovir LP after the stat CT head-Dr. Almyra Free planning to perform bedside. Sent for HSV PCR amongst other studies of the CSF Stat EEG followed by LTM EEG MRI brain without contrast tomorrow after LTM EEG is completed, with the presumption that there will be no ongoing seizure activity or status epilepticus.  Preliminary plan discussed with Dr. Almyra Free  Neurology to continue to follow  -- Amie Portland, MD Neurologist [facility] Pager: [phone]  CRITICAL CARE ATTESTATION Performed by: Amie Portland, MD Total critical care time: 40 minutes Critical care time was exclusive of separately billable procedures and treating other patients and/or supervising APPs/Residents/Students Critical care was necessary to treat or prevent imminent or life-threatening deterioration due to new onset seizures, status  epilepticus, concern for CNS infection This patient is critically ill and at significant risk for neurological worsening and/or death and care requires constant monitoring. Critical care was time spent personally by me on the following activities: development of treatment plan with  patient and/or surrogate as well as nursing, discussions with consultants, evaluation of patient's response to treatment, examination of patient, obtaining history from patient or surrogate, ordering and performing treatments and interventions, ordering and review of laboratory studies, ordering and review of radiographic studies, pulse oximetry, re-evaluation of patient's condition, participation in multidisciplinary rounds and medical decision making of high complexity in the care of this patient.

## 2021-04-22 NOTE — Progress Notes (Signed)
EEG complete - results pending 

## 2021-04-22 NOTE — ED Provider Notes (Signed)
[facility]ER[facility] DEPARTMENT Provider Note   CSN: [id number] Arrival date & time: 04/22/21  1601     History Chief Complaint  Patient presents with   Seizures    [name] is a 87 y.o. male.  Patient with history of diabetes, dementia, coronary artery disease, stroke on Eliquis, wheelchair-bound, presents with new onset seizures.  Patient was at his doctor's office today laying on the table when he 27 became unresponsive and then had generalized shaking.  Ambulance was called and he had persistent shaking until he was given Versed intramuscular.  Daughter at bedside states no other reports of fevers or cough or vomiting or diarrhea.  He was his normal self prior to his seizure-like episode.  They did notice that he had blisters along various parts of his body both left and right side ongoing for about a week.      Past Medical History:  Diagnosis Date   Arthritis    CAD (coronary artery disease)    s/p DES to LAD 06/2013   Dementia with behavioral disturbance (HCC)    Depression    Dyslipidemia    Glaucoma    Hyperglycemia 04/17/2014   Hyperlipidemia    Hypertension    Stroke ([place])    Type 2 diabetes mellitus ([place])     Patient Active Problem List   Diagnosis Date Noted   Seizure ([place]) 04/22/2021   Normocytic anemia 04/22/2021   Skin bulla 04/22/2021   Advanced dementia ([place])    Goals of care, counseling/discussion    Palliative care by specialist    Osteomyelitis ([place]) 12/05/2019   Wound infection    Pressure injury of sacral region, stage 4 ([place]) 10/12/2019   Vascular dementia with behavior disturbance ([place]) 10/12/2019   Bilateral hearing loss due to cerumen impaction 10/12/2019   Debility 10/12/2019   Pneumonia due to 2019 novel coronavirus 07/06/2019   Pneumonia due to COVID-19 virus 07/05/2019   Pulmonary embolism ([place]) 06/01/2019   Acute renal failure superimposed on stage 3 chronic kidney disease ([place]) 06/01/2019   Chronic  diastolic CHF (congestive heart failure) ([place]) 06/01/2019   Pain due to onychomycosis of toenails of both feet 01/23/2019   Bilateral lower extremity edema 07/05/2017   Pulmonary nodules 04/13/2017   Centrilobular emphysema ([place]) 04/13/2017   Calculus of gallbladder without cholecystitis without obstruction 04/13/2017   Chronic cough 04/13/2017   Adenoma of left adrenal gland 04/13/2017   Frequent falls 04/01/2017   Type 2 diabetes mellitus with stage 3 chronic kidney disease, with long-term current use of insulin ([place]) 04/01/2017   History of tobacco abuse 04/01/2017   Unsteady gait 04/01/2017   Dysarthria 04/01/2017   History of stroke    Hyperlipidemia    End of life care 11/19/2015   Moderate dementia with behavioral disturbance ([place]) 09/19/2015   Depression 09/19/2015   Hypertensive heart disease 09/10/2015   Type 2 diabetes, controlled, with peripheral circulatory disorder ([place])    Essential hypertension    Coronary artery disease involving native coronary artery of native heart with angina pectoris ([place])    Arthritis    Glaucoma     Past Surgical History:  Procedure Laterality Date   CARDIAC CATHETERIZATION  06/2013   DES to LAD by Dr Tamala Julian   Right Cataract         Family History  Problem Relation Age of Onset   Arthritis Mother    Breast cancer Mother    Arthritis Father    Dementia Father  Diabetes Other    Arthritis Other    Colon cancer Other    Stroke Other    Diabetes type II Sister    Suicidality Brother    Diabetes type I Brother    Diabetes type I Brother    Diabetes type I Brother    Diabetes type II Sister    Breast cancer Daughter    Emphysema Daughter     Social History   Tobacco Use   Smoking status: Former    Types: Cigarettes    Quit date: 08/02/1968    Years since quitting: 52.7   Smokeless tobacco: Never  Vaping Use   Vaping Use: Never used  Substance Use Topics   Alcohol use: No    Alcohol/week: 0.0 standard drinks   Drug  use: No    Home Medications Prior to Admission medications   Medication Sig Start Date End Date Taking? Authorizing Provider  Accu-Chek Softclix Lancets lancets Use to check blood sugar three times daily. Dx: E11.51 12/28/19   Reed, Tiffany L, DO  acetaminophen (TYLENOL) 500 MG tablet Take 500 mg by mouth every 6 (six) hours as needed for moderate pain.    [provider]  albuterol (VENTOLIN HFA) 108 (90 Base) MCG/ACT inhaler Inhale 2 puffs into the lungs every 4 (four) hours as needed for wheezing or shortness of breath. INHALE TWO PUFFS EVERY 4-6 HOURS AS NEEDED FOR DIFFICULTY BREATHING,WHEEZING OR COUGH Patient taking differently: Inhale 2 puffs into the lungs every 4 (four) hours as needed for wheezing or shortness of breath.  06/13/19   Blanchie Dessert, MD  Alcohol Swabs (SURE-PREP ALCOHOL PREP) 70 % PADS 1 each by Does not apply route as needed. 12/28/19   Reed, Tiffany L, DO  amLODipine (NORVASC) 5 MG tablet Take 1 tablet (5 mg total) by mouth daily. 11/12/19   Reed, Tiffany L, DO  apixaban (ELIQUIS) 5 MG TABS tablet Take 1 tablet (5 mg total) by mouth 2 (two) times daily. 11/12/19   Reed, Tiffany L, DO  aspirin EC 81 MG tablet Take 1 tablet (81 mg total) by mouth daily. 06/13/19   Blanchie Dessert, MD  Blood Glucose Monitoring Suppl (ACCU-CHEK AVIVA) device Use to test blood sugar three times daily. Dx E11.51 08/21/19   Reed, Tiffany L, DO  escitalopram (LEXAPRO) 5 MG tablet TAKE 1 TABLET EVERY DAY Patient taking differently: Take 5 mg by mouth daily.  11/05/19   Reed, Tiffany L, DO  feeding supplement, ENSURE ENLIVE, (ENSURE ENLIVE) LIQD Take 237 mLs by mouth 2 (two) times daily between meals. 12/07/19   Hosie Poisson, MD  glucose blood (ACCU-CHEK AVIVA PLUS) test strip Check blood sugar three times daily as directed E11.51 12/28/19   Reed, Tiffany L, DO  hydrochlorothiazide (HYDRODIURIL) 25 MG tablet TAKE 1 TABLET EVERY DAY Patient taking differently: Take 25 mg by mouth daily.   11/05/19   Reed, Tiffany L, DO  insulin glargine (LANTUS) 100 UNIT/ML injection Inject 8 Units into the skin daily.     [provider]  Insulin Pen Needle (B-D UF III MINI PEN NEEDLES) 31G X 5 MM MISC Use to give insulin. 12/28/19   Reed, Tiffany L, DO  Loratadine 10 MG CAPS Take 1 capsule (10 mg total) by mouth daily. 02/08/20   Reed, Tiffany L, DO  Multiple Vitamins-Minerals (SENTRY SENIOR) TABS Take 1 tablet by mouth daily. 06/13/19   Blanchie Dessert, MD  NAMZARIC 28-10 MG CP24 TAKE 1 CAPSULE EVERY DAY Patient taking differently: Take 1  capsule by mouth daily.  11/05/19   Reed, Tiffany L, DO  simvastatin (ZOCOR) 20 MG tablet Take 1 tablet (20 mg total) by mouth daily. 02/08/20   Reed, Tiffany L, DO  tamsulosin (FLOMAX) 0.4 MG CAPS capsule Take 1 capsule (0.4 mg total) by mouth daily after supper. 06/13/19   Blanchie Dessert, MD    Allergies    Lisinopril  Review of Systems   Review of Systems  Unable to perform ROS: Dementia   Physical Exam Updated Vital Signs BP 119/68   Pulse (!) 108   Temp (!) 96.9 F (36.1 C) (Rectal)   Resp 14   Ht 6' (1.829 m)   Wt 79.4 kg   SpO2 100%   BMI 23.74 kg/m   Physical Exam Constitutional:      Appearance: He is well-developed.  HENT:     Head: Normocephalic.     Nose: Nose normal.  Eyes:     Extraocular Movements: Extraocular movements intact.  Cardiovascular:     Rate and Rhythm: Normal rate.  Pulmonary:     Effort: Pulmonary effort is normal.  Skin:    Coloration: Skin is not jaundiced.     Comments: Sparse vesicular rash on extremities and torso.  Rash approximately 1 to 3 cm in size.  Various stages of healing.  Neurological:     Mental Status: He is alert. Mental status is at baseline.    ED Results / Procedures / Treatments   Labs (all labs ordered are listed, but only abnormal results are displayed) Labs Reviewed  CBC WITH DIFFERENTIAL/PLATELET - Abnormal; Notable for the following components:      Result Value    WBC 11.3 (*)    RBC 3.17 (*)    Hemoglobin 9.4 (*)    HCT 30.3 (*)    Eosinophils Absolute 4.2 (*)    All other components within normal limits  COMPREHENSIVE METABOLIC PANEL - Abnormal; Notable for the following components:   Potassium 5.7 (*)    Glucose, Bld 151 (*)    BUN 31 (*)    Total Protein 6.4 (*)    Albumin 2.7 (*)    GFR, Estimated 59 (*)    All other components within normal limits  LACTIC ACID, PLASMA - Abnormal; Notable for the following components:   Lactic Acid, Venous 3.7 (*)    All other components within normal limits  LACTIC ACID, PLASMA - Abnormal; Notable for the following components:   Lactic Acid, Venous 2.3 (*)    All other components within normal limits  I-STAT CHEM 8, ED - Abnormal; Notable for the following components:   Potassium 5.6 (*)    BUN 41 (*)    Glucose, Bld 149 (*)    Hemoglobin 9.9 (*)    HCT 29.0 (*)    All other components within normal limits  I-STAT ARTERIAL BLOOD GAS, ED - Abnormal; Notable for the following components:   pH, Arterial 7.561 (*)    pCO2 arterial 29.6 (*)    pO2, Arterial 160 (*)    Acid-Base Excess 4.0 (*)    HCT 22.0 (*)    Hemoglobin 7.5 (*)    All other components within normal limits  RESP PANEL BY RT-PCR (FLU A&B, COVID) ARPGX2  CULTURE, BLOOD (ROUTINE X 2)  CULTURE, BLOOD (ROUTINE X 2)  CSF CULTURE W GRAM STAIN  GRAM STAIN  HSV CULTURE AND TYPING  PROTIME-INR  BLOOD GAS, ARTERIAL  CSF CELL COUNT WITH DIFFERENTIAL  CSF CELL COUNT  WITH DIFFERENTIAL  GLUCOSE, CSF  PROTEIN, CSF  HSV 1/2 PCR, CSF  ARBOVIRUS IGG, CSF  TROPONIN I (HIGH SENSITIVITY)  TROPONIN I (HIGH SENSITIVITY)    EKG EKG Interpretation  Date/Time:  Wednesday April 22 2021 16:15:38 EDT Ventricular Rate:  116 PR Interval:  148 QRS Duration: 135 QT Interval:  367 QTC Calculation: 510 R Axis:   61 Text Interpretation: Sinus tachycardia Atrial premature complex Right bundle branch block Confirmed by Thamas Jaegers ([id number]) on  04/22/2021 5:21:57 PM  Radiology CT Head Wo Contrast  Result Date: 04/22/2021 CLINICAL DATA:  Seizure, nontraumatic (Age >= 101y) EXAM: CT HEAD WITHOUT CONTRAST TECHNIQUE: Contiguous axial images were obtained from the base of the skull through the vertex without intravenous contrast. COMPARISON:  Brain MRI 07/04/2019, CT 06/01/2019 FINDINGS: Brain: No evidence of acute infarction, hemorrhage, hydrocephalus, extra-axial collection or mass lesion/mass effect. Generalized atrophy with mild progression from 2020 exam. Moderately advanced chronic small vessel ischemia. Remote left frontal infarct. Vascular: Vascular Skull: No fracture or focal lesion. Sinuses/Orbits: Chronic mucosal thickening throughout the paranasal sinuses. No sinus fluid levels. No mastoid effusion. Other: None. IMPRESSION: 1. No acute intracranial abnormality. 2. Generalized atrophy with mild progression from 2020 exam. Moderately advanced chronic small vessel ischemia. Remote left frontal infarct. Electronically Signed   By: Keith Rake M.D.   On: 04/22/2021 20:52   DG Chest Portable 1 View  Result Date: 04/22/2021 CLINICAL DATA:  Status post intubation EXAM: PORTABLE CHEST 1 VIEW COMPARISON:  07/05/2019 FINDINGS: Endotracheal tube is now seen 3.8 cm above the carina. Gastric catheter is noted coiled within the stomach. Cardiac shadow is stable. Aortic calcifications are noted. The left lung demonstrates mild basilar scarring. Thickening of the minor fissure on the right is noted without definitive infiltrate. No other focal abnormality is noted. IMPRESSION: Tubes and lines as described above. Chronic changes in the lungs bilaterally. Electronically Signed   By: Inez Catalina M.D.   On: 04/22/2021 18:04   EEG adult  Result Date: 04/22/2021 Derek Jack, MD     04/22/2021  7:13 PM Routine EEG Report [name] is a 87 y.o. male with a history of seizures who is undergoing an EEG to evaluate for seizures. Report: This EEG was  acquired with electrodes placed according to the International 10-20 electrode system (including Fp1, Fp2, F3, F4, C3, C4, P3, P4, O1, O2, T3, T4, T5, T6, A1, A2, Fz, Cz, Pz). The following electrodes were missing or displaced: none. The occipital dominant rhythm was 3-4 Hz with overriding beta. This activity is reactive to stimulation. Sleep architecture was not identified. There was no focal slowing. There were no interictal epileptiform discharges. There were no electrographic seizures identified. Photic stimulation and hyperventilation were not performed. Impression and clinical corrrelation: This EEG was obtained while sedated and is abnormal due to severe diffuse slowing indicating a global cerebral dysfunction. Su Monks, MD [facility] [phone] If 7pm- 7am, please page neurology on call as listed in [place].    Procedures .Critical Care Performed by: Luna Fuse, MD Authorized by: Luna Fuse, MD   Critical care provider statement:    Critical care time (minutes):  45   Critical care time was exclusive of:  Separately billable procedures and treating other patients and teaching time   Critical care was necessary to treat or prevent imminent or life-threatening deterioration of the following conditions:  Respiratory failure and CNS failure or compromise Date/Time: 04/22/2021 9:53 PM Performed by: Luna Fuse, MD Comments: Patient  intubated for airway protection.  7.5 ET tube and glide scope used.  Patient given 20 mg of etomidate 100 mg rocuronium.  No desaturations noted first-pass success tube secured at 23 at the lips.    .Lumbar Puncture  Date/Time: 04/22/2021 9:53 PM Performed by: Luna Fuse, MD Authorized by: Luna Fuse, MD   Consent:    Consent obtained:  Verbal   Consent given by:  Guardian   Risks, benefits, and alternatives were discussed: yes     Risks discussed:  Bleeding, infection, pain, headache, nerve damage and repeat procedure    Alternatives discussed:  No treatment, delayed treatment and alternative treatment Comments:     Site prepped with Betadine x3 along the L4-5 region.  21-gauge spinal needle advanced with good return of clear CSF fluid on first attempt.  4 vials collected, patient tolerated well Needle removed no active bleeding or fluid drainage noted in wound dressed.   Medications Ordered in ED Medications  fentaNYL (SUBLIMAZE) injection 50 mcg (50 mcg Intravenous Not Given 04/22/21 1726)  fentaNYL 2552mcg in NS 255mL (17mcg/ml) infusion-PREMIX (175 mcg/hr Intravenous Rate/Dose Change 04/22/21 2030)  fentaNYL (SUBLIMAZE) bolus via infusion 25 mcg (has no administration in time range)  acyclovir (ZOVIRAX) 795 mg in dextrose 5 % 150 mL IVPB (0 mg Intravenous Stopped 04/22/21 2130)  0.9 %  sodium chloride infusion ( Intravenous New Bag/Given 04/22/21 1945)  lidocaine (PF) (XYLOCAINE) 1 % injection (has no administration in time range)  LORazepam (ATIVAN) injection 2 mg (2 mg Intravenous Given 04/22/21 1629)  LORazepam (ATIVAN) injection 2 mg (2 mg Intravenous Given 04/22/21 1630)  levETIRAcetam (KEPPRA) 2,000 mg in sodium chloride 0.9 % 250 mL IVPB (0 mg Intravenous Stopped 04/22/21 1744)  LORazepam (ATIVAN) injection 2 mg (2 mg Intravenous Given 04/22/21 1642)  etomidate (AMIDATE) injection 20 mg (20 mg Intravenous Given 04/22/21 1647)  rocuronium (ZEMURON) injection 100 mg (100 mg Intravenous Given 04/22/21 1647)  sodium chloride 0.9 % bolus 1,000 mL (0 mLs Intravenous Stopped 04/22/21 1823)    ED Course  I have reviewed the triage vital signs and the nursing notes.  Pertinent labs & imaging results that were available during my care of the patient were reviewed by me and considered in my medical decision making (see chart for details).    MDM Rules/Calculators/A&P                           Patient presents with new onset seizure at his 60 office.  No reported trauma or fall.  Despite multiple  medications still having tremulous movements of right upper extremity and some episodes of the right face.  Given multiple doses of Ativan 2 mg x 3, given Keppra loading dose 2000 mg.  Patient became more obtunded, requiring airway support and intubation.  Consultation with neurology.  Lumbar puncture performed by myself.  Admission to ICU. Final Clinical Impression(s) / ED Diagnoses Final diagnoses:  Seizure ([place])  Vesicular rash  Acute respiratory failure, unspecified whether with hypoxia or hypercapnia [facility])    Rx / DC Orders ED Discharge Orders     None        Luna Fuse, MD 04/22/21 2155

## 2021-04-22 NOTE — ED Triage Notes (Signed)
Pt was at wound care appointment when pt had a seizure like activity with facial twitching and right arm shaking per EMS. 5mg  IM Versed given by EMS. 2nd seizure episode en route to [facility]. Lives at home. No hx of seizures.  Bp 114/58 HR 120 O2 100% on nonrebreather CBG 155

## 2021-04-22 NOTE — Progress Notes (Signed)
[name], [name] ([id number]) Visit Report for 04/22/2021 HPI Details Patient Name: Date of Service: [name], [name] 04/22/2021 2:30 PM Medical Record Number: [id number] Patient Account Number: [id number] Date of Birth/Sex: Treating RN: 1922-07-10 (87 y.o. [name] Primary Care Provider: PA [name], NO Other Clinician: Referring Provider: Treating Provider/Extender: [name] in Treatment: 79 History of Present Illness HPI Description: /A3/17/2021 upon evaluation today patient presents for initial inspection here in our clinic concerning issues that has been having with a wound in the sacral region as well as the left heel. Fortunately neither appears to be overtly and significantly infected there is some necrotic tissue noted at both locations however that is going to be required to be addressed. He does have a history of diabetes mellitus type 2, vascular dementia, and hypertension. The patient did have a stroke in November 2020 unfortunately he has not been the same since that time according to his daughter. Prior to that he was taking care of himself now he is 100% dependent. He is at home with his daughter who is present during the office visit today as well that is where I get the majority of the history from at this point. Patient does have a history of hypertension as well which does not appear to be too out of control today which is good news. He does have a hemoglobin A1c of 6.05 May 2019. They currently have been using a calcium alginate dressing to the sacrum and it sounds like Skin-Prep and a foam on the heel. 4/9; pressure ulcers on the sacral area and left heel. Been using Anasept wet-to-dry on the sacrum and Hydrofera Blue in the heel. He has home health family is helping with the dressing. It sounds as though they are fairly religious about offloading these areas that have pressure relief surfaces for the bed. Wounds look better 4/30; I have not  seen this patient in 3 weeks. We have been using Anasept wet-to-dry the sacrum and Hydrofera Blue on the heel. When I saw this 3 weeks ago the sacrum actually look quite good and I thought that we might be able to transition him to a collagen-based dressing today. 5/10; the since the patient was last here he was hospitalized from 5/5 through 12/07/2019. He was noted to have a pressure injury of the sacral region stage IV. MRI did not suggest discitis or osteomyelitis. There was nonspecific bilateral paraspinal muscle edema. Culture I did when he was here the last time showed E. coli I believe they are aware of this. Blood cultures were negative. His white count was 10.8 on admission 11.9 at discharge his comprehensive metabolic panel notable for an albumin of 2.4 BUN of 32 with creatinine of 0.03 hemoglobin was only 7.3 with a discharge hemoglobin of 8.1 after dropping to a hemoglobin of 6.6 He is back at home. Discharge antibiotic was Augmentin.[name] He was treated with IV Rocephin in the hospital. The patient was seen by palliative care I have not looked at this consult. They have been using dressings that they had left over at home I think most recently [facility]. According to his daughter the patient is eating fairly well 12/28/19-Patient is back at 2 weeks, the sacral wound is about the same, the right heel has healed and closed. We are using silver alginate to the sacral wound 6/18; we continue to follow the sacral wound. His heel wounds have closed. We have been using silver alginate to the sacrum 7/1; sacral wound.  We have been using silver collagen with backing wet-to-dry. No open wounds on the heels. 7/15; some improvement in the dimensions of the sacral wound including the undermining area we have been using silver collagen with backing wet-to-dry dressings. We had some thoughts about trying a wound VAC. He has home health. 03/13/20-Sacral wound looks intact with healthy base, surrounding skin  intact, no maceration, he did unfortunately develop to abrasion wounds on his first second and third toes dorsum of the Right foot this happened when his foot got trapped below the wheelchair Footguard 8/30; sacral wound has come down slightly especially the undermining. We have been using a wound VAC She has 2 necrotic areas on the tip of the right first toe and the right second toe dorsally. 10/8 Is been about 5 weeks since I have seen this man. Family tells me that the sacrum really deteriorated over the last week or 2. The blame home health for some back issues. Dimensions here are a lot larger than last time including the undermining and there is now exposed bone. He had an MRI done in May when he was hospitalized at this area that did not show osteomyelitis nevertheless there is been a fairly significant deterioration since I last saw this wound. He has small abrasions on the right dorsal second and third toes which look as though they are progressing towards closure 10/22; the patient came in 2 weeks ago with exposed bone. Sample showed osteomyelitis culture Enterococcus. Given his frailty and the state of his dementia I elected to try to treat him with oral Augmentin currently on 500 3 times daily. I am going to try to keep him on this for 6 weeks. He is eating and drinking well taking the antibiotics well. So far no overt side effects 11/12; unfortunately the patient was away from clinic longer than I thought he was going to be in they ran out of Augmentin about a week ago. I'm going to renew this but reduce the dose somewhat to twice a day. His wound looks better we've been using silver alginate. No exposed bone today. 12/7; the patient is finished Augmentin had roughly 28 days. For some reason pharmacy will not fill it 2-week supply of liquid Augmentin, in any case I am finished giving him antibiotics. This was for the bone specimen that showed Enterococcus he has been on this for at least 6  weeks. Some improvement in wound area. There is still some exposed bone superiorly in the swollen 08/12/2020; 1 month follow-up. We treated him for underlying osteomyelitis. He is using silver collagen backing wet-to-dry. Advanced dementia precludes use of a wound VAC. I did talk with his daughter today about advanced treatment products but she is having to change the dressing too frequently during the week to make this possible. He is not a candidate for plastic surgery therefore I really think that the collagen backing wet-to- dry is the best option here. He has 1.1 cm in direct depth and 1.9 cm and undermining from 11-6 o'clock. 2/11; 1 month follow-up. The wound looks essentially the same. This does not probe to bone the base of it seems to have healthy granulation but in terms of the overall wound volume undermining I think this is all about the same. I went over the options last visit with his daughter which would include an advanced treatment product necessitating more frequent visits. Because of his dementia and agitation I do not think he is a candidate for wound VAC or  plastic surgery 3/17; 1 month follow-up. The wound looks generally the same. Lower sacrum healthy granulation no exposed bone minimal amount of undermining. He is family reminds me that we did try wound VAC back in the early part of the fall last year however they are wondering whether we could try it again. Looking back over our records this was interrupted by exposed bone and probable osteomyelitis. I treated him with at least 4 weeks of oral Augmentin. I did not think we can get him through an MRI. 4/28; 1 month follow-up. Patient is currently using a wound VAC. Patient's daughter is present. There are no issues or complaints today. 6/30; its been 2 months since the patient was in the clinic. He is still using silver collagen on the wound bed under wound VAC. He arrives in clinic today with the dimensions better including,  however and right in the middle of this wide open area of exposed bone. There is no obvious infection. This is not the first time this is happening. I treated him previously with 4 weeks of oral Augmentin. 7/21; we had some difficulty getting this patient back in. Bone biopsy I did last time showed chronic osteomyelitis. Culture of the bone showed Enterococcus faecalis. I started him on Augmentin on 02/04/2021 2 weeks worth. The daughter crushes the tablets. I am going to give him an additional 2 weeks with 1 renewal. He seems to be tolerating this well The patient has advanced dementia. It is difficult for them to transport him into our clinic. I do not think he is a candidate for a wound VAC. It is difficult to see setting him up for IV antibiotics although certainly a long-acting antibiotic such as Dalvance might be possible if this deteriorates again. I have talked to the daughter today about the likely palliative nature of this wound 8/18; the patient is completing his Augmentin. He has had 6 weeks. The wound actually looks better although dimensions are I think about the same. We have been using silver collagen with backing wet-to-dry 9/21; patient with advanced dementia we follow for a sacral decubitus ulcer. He arrives in clinic today with his daughter telling us that late last week he developed blisters on both anterior thighs, the right arm the umbilicus and a finger on his left hand. He has no history of blistering skin diseases the cause of this is really not clear. Before we cannot really get into evaluate his sacral wound he developed a generalized seizure involving both sides of his face and the right arm. His eyes were deviated to the left. He was turned on his left side op oxygen applied. His CBG was 178 [type II diabetic but not on any treatment]. The seizure stopped after 3 minutes his vital signs stabilized Electronic Signature(s) Signed: 04/22/2021 4:56:16 PM By: Linton Ham  MD Entered By: Linton Ham on 04/22/2021 15:22:50 -------------------------------------------------------------------------------- Physical Exam Details Patient Name: Date of Service: [name], [name] 04/22/2021 2:30 PM Medical Record Number: [id number] Patient Account Number: [id number] Date of Birth/Sex: Treating RN: 12/20/1921 (87 y.o. [name] Primary Care Provider: PA [name], NO Other Clinician: Referring Provider: Treating Provider/Extender: [name] in Treatment: 79 Constitutional Sitting or standing Blood Pressure is within target range for patient.. Pulse regular and within target range for patient.[name] Respirations regular, non-labored and within target range.. Temperature is normal and within the target range for the patient.[name] Appears in no distress. Notes Wound exam; we did not get to see his  sacral decubitus ulcer today. He did have intact blisters on the left anterior upper thigh right anterior although this was denuded. Umbilicus right arm and left third finger. This does not look to be an infected phenomenon. Electronic Signature(s) Signed: 04/22/2021 4:56:16 PM By: Linton Ham MD Entered By: Linton Ham on 04/22/2021 15:23:50 -------------------------------------------------------------------------------- Problem List Details Patient Name: Date of Service: [name], [name] 04/22/2021 2:30 PM Medical Record Number: [id number] Patient Account Number: [id number] Date of Birth/Sex: Treating RN: 1922-04-30 (87 y.o. [name] Primary Care Provider: PA [name], NO Other Clinician: Referring Provider: Treating Provider/Extender: [name] in Treatment: 79 Active Problems ICD-10 Encounter Code Description Active Date MDM Diagnosis L89.154 Pressure ulcer of sacral region, stage 4 10/17/2019 No Yes M86.18 Other acute osteomyelitis, other site 05/23/2020 No Yes G40.509 Epileptic seizures related to  external causes, not intractable, without status 04/22/2021 No Yes epilepticus Inactive Problems ICD-10 Code Description Active Date Inactive Date E11.621 Type 2 diabetes mellitus with foot ulcer 10/17/2019 10/17/2019 L97.522 Non-pressure chronic ulcer of other part of left foot with fat layer exposed 10/17/2019 10/17/2019 F01.50 Vascular dementia without behavioral disturbance 10/17/2019 10/17/2019 I10 Essential (primary) hypertension 10/17/2019 10/17/2019 L97.518 Non-pressure chronic ulcer of other part of right foot with other specified severity 03/31/2020 03/31/2020 L03.031 Cellulitis of right toe 03/31/2020 03/31/2020 Resolved Problems Electronic Signature(s) Signed: 04/22/2021 4:56:16 PM By: Linton Ham MD Entered By: Linton Ham on 04/22/2021 15:21:05 -------------------------------------------------------------------------------- Progress Note Details Patient Name: Date of Service: [facility] 04/22/2021 2:30 PM Medical Record Number: [id number] Patient Account Number: [id number] Date of Birth/Sex: Treating RN: [birth year] (87 y.o. [name] Primary Care Provider: PA [name], NO Other Clinician: Referring Provider: Treating Provider/Extender: [name] in Treatment: 79 Subjective History of Present Illness (HPI) /A3/17/2021 upon evaluation today patient presents for initial inspection here in our clinic concerning issues that has been having with a wound in the sacral region as well as the left heel. Fortunately neither appears to be overtly and significantly infected there is some necrotic tissue noted at both locations however that is going to be required to be addressed. He does have a history of diabetes mellitus type 2, vascular dementia, and hypertension. The patient did have a stroke in November 2020 unfortunately he has not been the same since that time according to his daughter. Prior to that he was taking care of himself now he is 100%  dependent. He is at home with his daughter who is present during the office visit today as well that is where I get the majority of the history from at this point. Patient does have a history of hypertension as well which does not appear to be too out of control today which is good news. He does have a hemoglobin A1c of 6.05 May 2019. They currently have been using a calcium alginate dressing to the sacrum and it sounds like Skin-Prep and a foam on the heel. 4/9; pressure ulcers on the sacral area and left heel. Been using Anasept wet-to-dry on the sacrum and Hydrofera Blue in the heel. He has home health family is helping with the dressing. It sounds as though they are fairly religious about offloading these areas that have pressure relief surfaces for the bed. Wounds look better 4/30; I have not seen this patient in 3 weeks. We have been using Anasept wet-to-dry the sacrum and Hydrofera Blue on the heel. When I saw this 3 weeks ago the sacrum actually look quite good  and I thought that we might be able to transition him to a collagen-based dressing today. 5/10; the since the patient was last here he was hospitalized from 5/5 through 12/07/2019. He was noted to have a pressure injury of the sacral region stage IV. MRI did not suggest discitis or osteomyelitis. There was nonspecific bilateral paraspinal muscle edema. Culture I did when he was here the last time showed E. coli I believe they are aware of this. Blood cultures were negative. His white count was 10.8 on admission 11.9 at discharge his comprehensive metabolic panel notable for an albumin of 2.4 BUN of 32 with creatinine of 0.03 hemoglobin was only 7.3 with a discharge hemoglobin of 8.1 after dropping to a hemoglobin of 6.6 He is back at home. Discharge antibiotic was Augmentin.[name] He was treated with IV Rocephin in the hospital. The patient was seen by palliative care I have not looked at this consult. They have been using dressings that  they had left over at home I think most recently [facility]. According to his daughter the patient is eating fairly well 12/28/19-Patient is back at 2 weeks, the sacral wound is about the same, the right heel has healed and closed. We are using silver alginate to the sacral wound 6/18; we continue to follow the sacral wound. His heel wounds have closed. We have been using silver alginate to the sacrum 7/1; sacral wound. We have been using silver collagen with backing wet-to-dry. No open wounds on the heels. 7/15; some improvement in the dimensions of the sacral wound including the undermining area we have been using silver collagen with backing wet-to-dry dressings. We had some thoughts about trying a wound VAC. He has home health. 03/13/20-Sacral wound looks intact with healthy base, surrounding skin intact, no maceration, he did unfortunately develop to abrasion wounds on his first second and third toes dorsum of the Right foot this happened when his foot got trapped below the wheelchair Footguard 8/30; sacral wound has come down slightly especially the undermining. We have been using a wound VAC ooShe has 2 necrotic areas on the tip of the right first toe and the right second toe dorsally. 10/8 Is been about 5 weeks since I have seen this man. Family tells me that the sacrum really deteriorated over the last week or 2. The blame home health for some back issues. Dimensions here are a lot larger than last time including the undermining and there is now exposed bone. He had an MRI done in May when he was hospitalized at this area that did not show osteomyelitis nevertheless there is been a fairly significant deterioration since I last saw this wound. He has small abrasions on the right dorsal second and third toes which look as though they are progressing towards closure 10/22; the patient came in 2 weeks ago with exposed bone. Sample showed osteomyelitis culture Enterococcus. Given his frailty  and the state of his dementia I elected to try to treat him with oral Augmentin currently on 500 3 times daily. I am going to try to keep him on this for 6 weeks. He is eating and drinking well taking the antibiotics well. So far no overt side effects 11/12; unfortunately the patient was away from clinic longer than I thought he was going to be in they ran out of Augmentin about a week ago. I'm going to renew this but reduce the dose somewhat to twice a day. His wound looks better we've been using silver alginate. No  exposed bone today. 12/7; the patient is finished Augmentin had roughly 28 days. For some reason pharmacy will not fill it 2-week supply of liquid Augmentin, in any case I am finished giving him antibiotics. This was for the bone specimen that showed Enterococcus he has been on this for at least 6 weeks. Some improvement in wound area. There is still some exposed bone superiorly in the swollen 08/12/2020; 1 month follow-up. We treated him for underlying osteomyelitis. He is using silver collagen backing wet-to-dry. Advanced dementia precludes use of a wound VAC. I did talk with his daughter today about advanced treatment products but she is having to change the dressing too frequently during the week to make this possible. He is not a candidate for plastic surgery therefore I really think that the collagen backing wet-to- dry is the best option here. He has 1.1 cm in direct depth and 1.9 cm and undermining from 11-6 o'clock. 2/11; 1 month follow-up. The wound looks essentially the same. This does not probe to bone the base of it seems to have healthy granulation but in terms of the overall wound volume undermining I think this is all about the same. I went over the options last visit with his daughter which would include an advanced treatment product necessitating more frequent visits. Because of his dementia and agitation I do not think he is a candidate for wound VAC or plastic  surgery 3/17; 1 month follow-up. The wound looks generally the same. Lower sacrum healthy granulation no exposed bone minimal amount of undermining. He is family reminds me that we did try wound VAC back in the early part of the fall last year however they are wondering whether we could try it again. Looking back over our records this was interrupted by exposed bone and probable osteomyelitis. I treated him with at least 4 weeks of oral Augmentin. I did not think we can get him through an MRI. 4/28; 1 month follow-up. Patient is currently using a wound VAC. Patient's daughter is present. There are no issues or complaints today. 6/30; its been 2 months since the patient was in the clinic. He is still using silver collagen on the wound bed under wound VAC. He arrives in clinic today with the dimensions better including, however and right in the middle of this wide open area of exposed bone. There is no obvious infection. This is not the first time this is happening. I treated him previously with 4 weeks of oral Augmentin. 7/21; we had some difficulty getting this patient back in. Bone biopsy I did last time showed chronic osteomyelitis. Culture of the bone showed Enterococcus faecalis. I started him on Augmentin on 02/04/2021 2 weeks worth. The daughter crushes the tablets. I am going to give him an additional 2 weeks with 1 renewal. He seems to be tolerating this well The patient has advanced dementia. It is difficult for them to transport him into our clinic. I do not think he is a candidate for a wound VAC. It is difficult to see setting him up for IV antibiotics although certainly a long-acting antibiotic such as Dalvance might be possible if this deteriorates again. I have talked to the daughter today about the likely palliative nature of this wound 8/18; the patient is completing his Augmentin. He has had 6 weeks. The wound actually looks better although dimensions are I think about the same. We  have been using silver collagen with backing wet-to-dry 9/21; patient with advanced dementia we follow for  a sacral decubitus ulcer. He arrives in clinic today with his daughter telling us that late last week he developed blisters on both anterior thighs, the right arm the umbilicus and a finger on his left hand. He has no history of blistering skin diseases the cause of this is really not clear. Before we cannot really get into evaluate his sacral wound he developed a generalized seizure involving both sides of his face and the right arm. His eyes were deviated to the left. He was turned on his left side op oxygen applied. His CBG was 178 [type II diabetic but not on any treatment]. The seizure stopped after 3 minutes his vital signs stabilized Objective Constitutional Sitting or standing Blood Pressure is within target range for patient.. Pulse regular and within target range for patient.[name] Respirations regular, non-labored and within target range.. Temperature is normal and within the target range for the patient.[name] Appears in no distress. Vitals Time Taken: 2:40 PM, Height: 71 in, Weight: 175 lbs, BMI: 24.4, Temperature: 99 F, Pulse: 108 bpm, Respiratory Rate: 20 breaths/min, Blood Pressure: 121/64 mmHg. General Notes: Wound exam; we did not get to see his sacral decubitus ulcer today. He did have intact blisters on the left anterior upper thigh right anterior although this was denuded. Umbilicus right arm and left third finger. This does not look to be an infected phenomenon. Integumentary (Hair, Skin) Wound #10 status is Open. Original cause of wound was Blister. The date acquired was: 04/16/2021. The wound is located on the Left,Proximal Upper Arm. The wound measures 2cm length x 2.2cm width x 0.1cm depth; 3.456cm^2 area and 0.346cm^3 volume. There is Fat Layer (Subcutaneous Tissue) exposed. There is no tunneling or undermining noted. There is a medium amount of serosanguineous drainage  noted. The wound margin is distinct with the outline attached to the wound base. There is large (67-100%) pink, pale granulation within the wound bed. There is no necrotic tissue within the wound bed. Wound #6 status is Open. Original cause of wound was Blister. The date acquired was: 04/16/2021. The wound is located on the Left,Medial Upper Leg. The wound measures 2cm length x 2.7cm width x 0.1cm depth; 4.241cm^2 area and 0.424cm^3 volume. There is Fat Layer (Subcutaneous Tissue) exposed. There is no tunneling or undermining noted. There is a medium amount of serosanguineous drainage noted. The wound margin is distinct with the outline attached to the wound base. There is large (67-100%) pink, pale granulation within the wound bed. There is no necrotic tissue within the wound bed. Wound #7 status is Open. Original cause of wound was Blister. The date acquired was: 04/16/2021. The wound is located on the Right,Medial Upper Leg. The wound measures 2.5cm length x 3.2cm width x 0.1cm depth; 6.283cm^2 area and 0.628cm^3 volume. There is Fat Layer (Subcutaneous Tissue) exposed. There is no tunneling or undermining noted. There is a medium amount of serosanguineous drainage noted. The wound margin is distinct with the outline attached to the wound base. There is large (67-100%) pink granulation within the wound bed. There is no necrotic tissue within the wound bed. Wound #8 status is Open. Original cause of wound was Blister. The date acquired was: 04/16/2021. The wound is located on the Left,Anterior Upper Leg. The wound measures 4.5cm length x 3.5cm width x 0.1cm depth; 12.37cm^2 area and 1.237cm^3 volume. There is Fat Layer (Subcutaneous Tissue) exposed. There is no tunneling or undermining noted. There is a medium amount of serosanguineous drainage noted. The wound margin is distinct with the  outline attached to the wound base. There is large (67-100%) pink granulation within the wound bed. There is no  necrotic tissue within the wound bed. Wound #9 status is Open. Original cause of wound was Blister. The date acquired was: 04/16/2021. The wound is located on the Left,Distal Upper Arm. The wound measures 4.5cm length x 1.8cm width x 0.1cm depth; 6.362cm^2 area and 0.636cm^3 volume. There is Fat Layer (Subcutaneous Tissue) exposed. There is no tunneling or undermining noted. There is a small amount of serosanguineous drainage noted. The wound margin is distinct with the outline attached to the wound base. There is large (67-100%) pink granulation within the wound bed. There is no necrotic tissue within the wound bed. Assessment Active Problems ICD-10 Pressure ulcer of sacral region, stage 4 Other acute osteomyelitis, other site Epileptic seizures related to external causes, not intractable, without status epilepticus Plan 1. We did not check the patient's sacral wound today. 2. I am not certain of the cause of the intact blistering I wonder if this represents bullous pemphigoid 3. Generalized seizure. The patient has advanced dementia. Certainly this puts him at risk I am not sure if there is another phenomenon. 4. EMS was called he was transported to the ER. Vital signs were stable pulse ox was good. CBG 178 Electronic Signature(s) Signed: 04/22/2021 4:56:16 PM By: Linton Ham MD Entered By: Linton Ham on 04/22/2021 15:25:11 -------------------------------------------------------------------------------- SuperBill Details Patient Name: Date of Service: [name], [name] 04/22/2021 Medical Record Number: [id number] Patient Account Number: [id number] Date of Birth/Sex: Treating RN: 1922-01-06 (87 y.o. [name] Primary Care Provider: PA [name], NO Other Clinician: Referring Provider: Treating Provider/Extender: [name] in Treatment: 79 Diagnosis Coding ICD-10 Codes Code Description L89.154 Pressure ulcer of sacral region, stage 4 M86.18 Other  acute osteomyelitis, other site G40.509 Epileptic seizures related to external causes, not intractable, without status epilepticus Facility Procedures CPT4 Code: 82800349 Description: [id number] - WOUND CARE VISIT-LEV 5 EST PT Modifier: Quantity: 1 Physician Procedures : CPT4 Code Description Modifier [id number] - WC PHYS LEVEL 3 - EST PT 1 ICD-10 Diagnosis Description L89.154 Pressure ulcer of sacral region, stage 4 M86.18 Other acute osteomyelitis, other site G40.509 Epileptic seizures related to external causes,  not intractable, without status epilepticus Quantity: Electronic Signature(s) Signed: 04/22/2021 4:56:16 PM By: Linton Ham MD Signed: 04/22/2021 5:58:14 PM By: Deon Pilling RN, BSN Entered By: Deon Pilling on 04/22/2021 16:04:36

## 2021-04-22 NOTE — Progress Notes (Signed)
Pharmacy Antibiotic Note  [name] is a 87 y.o. male admitted on 04/22/2021 with  Seizures and status epilepticus .  Pharmacy has been consulted for acyclovir dosing.  Plan: Acyclovir 10mg /kg (795 mg) q12hr unless change in renal function NS at 125 ml/hr for crystalluria prevention Trend SCr, Fever, neuro status F/u neuro recs  Height: 6' (182.9 cm) Weight: 79.4 kg (175 lb 0.7 oz) IBW/kg (Calculated) : 77.6  Temp (24hrs), Avg:97.5 F (36.4 C), Min:97.5 F (36.4 C), Max:97.5 F (36.4 C)  Recent Labs  Lab 04/22/21 1624 04/22/21 1628  WBC 11.3*  --   CREATININE 1.13 1.10  LATICACIDVEN 3.7*  --     Estimated Creatinine Clearance: 42.1 mL/min (by C-G formula based on SCr of 1.1 mg/dL).    Allergies  Allergen Reactions   Lisinopril Cough   Antimicrobials this admission: acyclovir 9/21 >>   Microbiology results: Pending  Thank you for allowing pharmacy to be a part of this patient's care.  Lorelei Pont, PharmD, BCPS 04/22/2021 7:04 PM ED Clinical Pharmacist -  [phone]

## 2021-04-22 NOTE — ED Notes (Signed)
Preparing for intubation. Pt is not responding to 6 mg total of IV Ativan. Continues to have the facial and right arm twitching. Will continue to monitor.

## 2021-04-22 NOTE — ED Notes (Signed)
Pt observed with seizure activity again. Observed with right arm and facial twitching. Dr.Hong at bedside. Total of 4mg  IV Ativan given. Pt continues to have the twitching. RT notified. MD at bedside. Will continue to monitor.

## 2021-04-23 ENCOUNTER — Inpatient Hospital Stay (HOSPITAL_COMMUNITY): Payer: Medicare HMO

## 2021-04-23 ENCOUNTER — Other Ambulatory Visit: Payer: Self-pay

## 2021-04-23 DIAGNOSIS — E440 Moderate protein-calorie malnutrition: Secondary | ICD-10-CM | POA: Insufficient documentation

## 2021-04-23 DIAGNOSIS — G40901 Epilepsy, unspecified, not intractable, with status epilepticus: Secondary | ICD-10-CM | POA: Diagnosis not present

## 2021-04-23 DIAGNOSIS — J432 Centrilobular emphysema: Secondary | ICD-10-CM | POA: Diagnosis not present

## 2021-04-23 DIAGNOSIS — R9389 Abnormal findings on diagnostic imaging of other specified body structures: Secondary | ICD-10-CM | POA: Diagnosis not present

## 2021-04-23 DIAGNOSIS — T148XXA Other injury of unspecified body region, initial encounter: Secondary | ICD-10-CM | POA: Diagnosis not present

## 2021-04-23 LAB — COMPREHENSIVE METABOLIC PANEL
ALT: 19 U/L (ref 0–44)
AST: 24 U/L (ref 15–41)
Albumin: 2.4 g/dL — ABNORMAL LOW (ref 3.5–5.0)
Alkaline Phosphatase: 59 U/L (ref 38–126)
Anion gap: 7 (ref 5–15)
BUN: 25 mg/dL — ABNORMAL HIGH (ref 8–23)
CO2: 27 mmol/L (ref 22–32)
Calcium: 8.3 mg/dL — ABNORMAL LOW (ref 8.9–10.3)
Chloride: 106 mmol/L (ref 98–111)
Creatinine, Ser: 1.08 mg/dL (ref 0.61–1.24)
GFR, Estimated: >60 mL/min (ref >60)
Glucose, Bld: 101 mg/dL — ABNORMAL HIGH (ref 70–99)
Potassium: 4.9 mmol/L (ref 3.5–5.1)
Sodium: 140 mmol/L (ref 135–145)
Total Bilirubin: 0.7 mg/dL (ref 0.3–1.2)
Total Protein: 5.7 g/dL — ABNORMAL LOW (ref 6.5–8.1)

## 2021-04-23 LAB — URINALYSIS, ROUTINE W REFLEX MICROSCOPIC
Bilirubin Urine: NEGATIVE
Glucose, UA: NEGATIVE mg/dL
Ketones, ur: NEGATIVE mg/dL
Nitrite: NEGATIVE
Protein, ur: 30 mg/dL — AB
Specific Gravity, Urine: <1.005 — ABNORMAL LOW (ref 1.005–1.030)
pH: 6 (ref 5.0–8.0)

## 2021-04-23 LAB — CBC
HCT: 26.2 % — ABNORMAL LOW (ref 39.0–52.0)
Hemoglobin: 8.4 g/dL — ABNORMAL LOW (ref 13.0–17.0)
MCH: 30 pg (ref 26.0–34.0)
MCHC: 32.1 g/dL (ref 30.0–36.0)
MCV: 93.6 fL (ref 80.0–100.0)
Platelets: ADEQUATE 10*3/uL (ref 150–400)
RBC: 2.8 MIL/uL — ABNORMAL LOW (ref 4.22–5.81)
RDW: 14.2 % (ref 11.5–15.5)
WBC: 12.7 10*3/uL — ABNORMAL HIGH (ref 4.0–10.5)
nRBC: 0 % (ref 0.0–0.2)

## 2021-04-23 LAB — GLUCOSE, CSF: Glucose, CSF: 83 mg/dL — ABNORMAL HIGH (ref 40–70)

## 2021-04-23 LAB — I-STAT ARTERIAL BLOOD GAS, ED
Acid-Base Excess: 3 mmol/L — ABNORMAL HIGH (ref 0.0–2.0)
Bicarbonate: 28 mmol/L (ref 20.0–28.0)
Calcium, Ion: 1.22 mmol/L (ref 1.15–1.40)
HCT: 24 % — ABNORMAL LOW (ref 39.0–52.0)
Hemoglobin: 8.2 g/dL — ABNORMAL LOW (ref 13.0–17.0)
O2 Saturation: 97 %
Potassium: 4.6 mmol/L (ref 3.5–5.1)
Sodium: 139 mmol/L (ref 135–145)
TCO2: 29 mmol/L (ref 22–32)
pCO2 arterial: 42.5 mmHg (ref 32.0–48.0)
pH, Arterial: 7.427 (ref 7.350–7.450)
pO2, Arterial: 93 mmHg (ref 83.0–108.0)

## 2021-04-23 LAB — GLUCOSE, CAPILLARY
Glucose-Capillary: 103 mg/dL — ABNORMAL HIGH (ref 70–99)
Glucose-Capillary: 108 mg/dL — ABNORMAL HIGH (ref 70–99)
Glucose-Capillary: 130 mg/dL — ABNORMAL HIGH (ref 70–99)
Glucose-Capillary: 137 mg/dL — ABNORMAL HIGH (ref 70–99)
Glucose-Capillary: 147 mg/dL — ABNORMAL HIGH (ref 70–99)
Glucose-Capillary: 83 mg/dL (ref 70–99)
Glucose-Capillary: 88 mg/dL (ref 70–99)

## 2021-04-23 LAB — URINALYSIS, MICROSCOPIC (REFLEX): RBC / HPF: >50 RBC/hpf (ref 0–5)

## 2021-04-23 LAB — CBG MONITORING, ED: Glucose-Capillary: 96 mg/dL (ref 70–99)

## 2021-04-23 LAB — MAGNESIUM
Magnesium: 1.7 mg/dL (ref 1.7–2.4)
Magnesium: 1.7 mg/dL (ref 1.7–2.4)

## 2021-04-23 LAB — PROTEIN, CSF: Total  Protein, CSF: 62 mg/dL — ABNORMAL HIGH (ref 15–45)

## 2021-04-23 LAB — MRSA NEXT GEN BY PCR, NASAL: MRSA by PCR Next Gen: NOT DETECTED

## 2021-04-23 LAB — HEMOGLOBIN A1C
Hgb A1c MFr Bld: 6.5 % — ABNORMAL HIGH (ref 4.8–5.6)
Mean Plasma Glucose: 140 mg/dL

## 2021-04-23 LAB — LACTIC ACID, PLASMA: Lactic Acid, Venous: 3 mmol/L (ref 0.5–1.9)

## 2021-04-23 LAB — PHOSPHORUS
Phosphorus: 2.6 mg/dL (ref 2.5–4.6)
Phosphorus: 2.4 mg/dL — ABNORMAL LOW (ref 2.5–4.6)

## 2021-04-23 MED ORDER — VITAL AF 1.2 CAL PO LIQD
1000.0000 mL | ORAL | Status: DC
  Administered 2021-04-23: 1000 mL

## 2021-04-23 MED ORDER — SODIUM CHLORIDE 0.9 % IV BOLUS
1000.0000 mL | Freq: Once | INTRAVENOUS | Status: AC
  Administered 2021-04-23: 1000 mL via INTRAVENOUS

## 2021-04-23 MED ORDER — LORAZEPAM 2 MG/ML IJ SOLN
2.0000 mg | Freq: Once | INTRAMUSCULAR | Status: AC | PRN
  Administered 2021-04-23: 2 mg via INTRAVENOUS
  Filled 2021-04-23: qty 1

## 2021-04-23 MED ORDER — CLOBETASOL PROPIONATE 0.05 % EX CREA
1.0000 "application " | TOPICAL_CREAM | Freq: Two times a day (BID) | CUTANEOUS | Status: DC
  Administered 2021-04-24 – 2021-05-02 (×17): 1 via TOPICAL
  Filled 2021-04-23 (×3): qty 15

## 2021-04-23 MED ORDER — ESCITALOPRAM OXALATE 10 MG PO TABS
5.0000 mg | ORAL_TABLET | Freq: Every day | ORAL | Status: DC
  Administered 2021-04-24: 5 mg
  Filled 2021-04-23: qty 1

## 2021-04-23 MED ORDER — LIDOCAINE HCL (CARDIAC) PF 100 MG/5ML IV SOSY
PREFILLED_SYRINGE | INTRAVENOUS | Status: AC
  Filled 2021-04-23: qty 5

## 2021-04-23 MED ORDER — MAGNESIUM SULFATE 2 GM/50ML IV SOLN
2.0000 g | Freq: Once | INTRAVENOUS | Status: AC
  Administered 2021-04-23: 2 g via INTRAVENOUS
  Filled 2021-04-23: qty 50

## 2021-04-23 MED ORDER — ADULT MULTIVITAMIN W/MINERALS CH
1.0000 | ORAL_TABLET | Freq: Every day | ORAL | Status: DC
  Administered 2021-04-23 – 2021-04-24 (×2): 1
  Filled 2021-04-23: qty 1

## 2021-04-23 MED ORDER — BACITRACIN ZINC 500 UNIT/GM EX OINT
1.0000 "application " | TOPICAL_OINTMENT | Freq: Two times a day (BID) | CUTANEOUS | Status: DC
  Administered 2021-04-24 – 2021-05-02 (×17): 1 via TOPICAL
  Filled 2021-04-23 (×2): qty 28.4

## 2021-04-23 MED ORDER — LIDOCAINE HCL (PF) 1 % IJ SOLN
INTRAMUSCULAR | Status: AC
  Filled 2021-04-23: qty 5

## 2021-04-23 MED ORDER — LEVETIRACETAM IN NACL 500 MG/100ML IV SOLN
500.0000 mg | Freq: Two times a day (BID) | INTRAVENOUS | Status: DC
  Administered 2021-04-23 – 2021-04-25 (×5): 500 mg via INTRAVENOUS
  Filled 2021-04-23 (×5): qty 100

## 2021-04-23 MED ORDER — LIDOCAINE HCL (PF) 2 % IJ SOLN
0.0000 mL | Freq: Once | INTRAMUSCULAR | Status: DC | PRN
  Filled 2021-04-23 (×3): qty 20

## 2021-04-23 MED ORDER — ASPIRIN 81 MG PO CHEW
81.0000 mg | CHEWABLE_TABLET | Freq: Every day | ORAL | Status: DC
  Administered 2021-04-24: 81 mg
  Filled 2021-04-23: qty 1

## 2021-04-23 NOTE — Procedures (Signed)
Skin Punch Biopsy   Indication: unexplained bullous rash  Consent obtained.   Aseptic technique, chlorhexidine prep.   An area at the edge of rash on the outer aspect of the left thigh was prepped and infiltrated with 1% lidocaine. Two separate punch biopsies were taken at the edge of the lesion. Both were sutured with a single stitch with good hemostasis.   Sample sent in saline for pathology.  Kipp Brood, MD [facility] ICU Physician [place]  Pager: [phone] Or Epic Secure Chat After hours: [phone].  04/23/2021, 4:15 PM

## 2021-04-23 NOTE — Progress Notes (Addendum)
NAMEDastan Krider MRN:  [id number] DOB:  30-Dec-1921 LOS: 1 ADMISSION DATE:  04/22/2021 DATE OF SERVICE:  04/22/2021  CHIEF COMPLAINT:  new onset seizures   HISTORY & PHYSICAL  History of Present Illness  This 87 y.o. African American male reformed smoker presented to the [facility] Emergency Department via EMS with complaints of new onset seizure disorder.  The patient was intubated in the ER for airway protection.  PCCM asked to admit.  He transferred to [facility] the [place], where he presented for a follow up visit for evaluation of bullous skin lesions, skin tears and sacral decubitus.  During his visit, while undergoing examination, he was observed to suddenly develop seizure-like activity with pupils deviated to the right side (per the patient's daughter).  His seizure activity was treated with Versed 5 mg single dose by EMS, which reportedly resulted in suppression of seizure activity.  In the ER, however, he was noted to still have some facial twitching which was suspected to be ongoing seizure activity.  He received 3 additional doses of Ativan 2 mg IV and was loaded with Keppra.  Neurology consulted and following.  The patinet lives with his daughter, who is his primary caregiver.  She reports that the patient was at baseline in terms of mentation and activity (wheelchair-bound as a sequela of previous stroke) leading up to the onset of seizures today.  The patient remains critically ill  Pertinent PMX  CAD with DES 2014, HTN, HLD, Dementia, Stroke, DMII, stage 4 PI  Procedures:  9/21 LP in ER, intubated in ER  Significant Diagnostic Tests:  9/21 CTH negative for acute process  COVID - Micro Data:  COVID - TA>  BC> CSF>  Antimicrobials:  Acyclovir (9/21>>)   Interim history/subjective:  Admitted  Tmax 99.7 +2.7 L, 771ml uop  Intubated on fentanyl gtt  EEG ongoing  Unable to obtain subjective evaluation due to  patient status   Objective   BP (!) 105/50   Pulse (!) 107   Temp 99.7 F (37.6 C) (Axillary)   Resp 15   Ht 6' (1.829 m)   Wt 65.4 kg   SpO2 100%   BMI 19.55 kg/m     Filed Weights   04/22/21 1620 04/23/21 0500  Weight: 79.4 kg 65.4 kg    Intake/Output Summary (Last 24 hours) at 04/23/2021 0755 Last data filed at 04/23/2021 0700 Gross per 24 hour  Intake 3831.69 ml  Output 1025 ml  Net 2806.69 ml    Vent Mode: PRVC FiO2 (%):  [30 %-40 %] 30 % Set Rate:  [14 bmp-18 bmp] 14 bmp Vt Set:  [361 mL] 620 mL PEEP:  [5 cmH20] 5 cmH20 Plateau Pressure:  [23 cmH20] 23 cmH20   Examination: General:  In bed, NAD, cachetic, NAD HEENT: MM pink/moist, anicteric, atraumatic Neuro: On fentanyl gtt, scant movent to noxious stimuli, RASS -4, PEERL 13mm, opens eyes to pain. CV: S1S2, ST, no m/r/g appreciated PULM:  Air movement in all lobes, Trachea midline, chest expansion symmetric GI: soft, bsx4 active, nondistended Extremities: warm/dry, no pretibial edema, capillary refill less than 3 seconds  Skin: Sacral PI, bulla on L 3 digit pip, L axillary skin tears, L groin, see pictures   Resolved Hospital Problem list      Assessment & Plan:   Status epilepticus Acute encephalopathy HX Dementia and stroke- hx of right sided weakness, in wheelchair, prior head ct with chronic left frontal  encephalomalacia. Presented with seizure while at wound care clinic. ? Of seizure on patient birthday in early September. Head CT negative for acute changes. Afebrile. Neurology with suspicion of viral encephalitis. EEG abnormal d/t severe diffuse slowing. WBC 12.7. CSF Glucose 83, Protein 62, WBC 1-3, clear fluid, suspect non bacterial. -Neurology following. Workup and management per neurology -Continue Keppra 500 BID -Continue EEG per neurology -MRI brain when EEG is completed -Follow up LP cultures/studies and BC -Continue acyclovir -Follow up AM labs -Start TF today. -Stop fentanyl GTT to  assess neurological status. -Obtain UA  Hypotension Lactic acidosis- suspect secondary to seizure BP 89/51. Suspect iatrogenic. On fent gtt. S/P 1 L bolus. + 2.7 L admit -recheck lactate -goal MAP 65 or greater. -Follow up cultures, check UA/UC  Endotracheally intubated Centrilobular emphysema Intubated for airway protection. CXR stable tube, no infiltrate -LTVV strategy with tidal volumes of 4-8 cc/kg ideal body weight -Goal plateau pressures less than 30 and driving pressures less than 15 -Wean PEEP/FiO2 for SpO2 92-98% -VAP bundle -Daily SAT and SBT -PAD bundle with fentanyl gtt. See above -RASS goal 0 to -1 -Follow intermittent CXR and ABG PRN -Continue pulmicort/duoneb/albuterol prn -Follow up TA  DM2 -Blood Glucose goal 140-180. -SSI  Normocytic anemia HGB 8.4. Suspect chronic. No signs of bleeding -Transfuse PRBC if HBG less than 7 -Obtain AM CBC to trend H&H -Monitor for signs of bleeding  Stage 4 PI, POA Skin bulla POA Per family began about 1 week ago. -Wound care consulted -Care per recs -Dr. Lynetta Mare to preform punch biopsy  Hypomagnesia MG 1.7 -Replete. Follow up on AM labs  HX HTN, HLD, CAD (DES to LAD 06/2013) -pharmacy to do med review today. Will reassess resumption of medications post review. -Holding home antihypertensives  Best Practice (right click and "Reselect all SmartList Selections" daily)   Diet/type: NPO w/ meds via tube DVT prophylaxis: systemic heparin GI prophylaxis: H2B Central venous access:  N/A Foley:  Yes, and it is still needed Code Status:  full code Last date of multidisciplinary goals of care discussion [Pending]     Critical care time: 30 Minutes The treatment and management of the patient's condition was required based on the threat of imminent deterioration. This time reflects time spent by the APP evaluating, providing care and managing the critically ill patient's care. The time was spent at the immediate  bedside (or on the same floor/unit and dedicated to this patient's care). Time involved in separately billable procedures is NOT included in the critical care time indicated above. Family meeting and update time may be included above if and only if the patient is unable/incompetent to participate in clinical interview and/or decision making, and the discussion was necessary to determining treatment decisions.   Redmond School., MSN, APRN, AGACNP-BC [facility]  04/23/2021 , 7:56 AM  Please see [email or web address] for pager details  If no response, please call [phone] After hours, please call Elink at [phone]

## 2021-04-23 NOTE — Progress Notes (Signed)
LTM EEG discontinued - no skin breakdown at unhook.   

## 2021-04-23 NOTE — Progress Notes (Signed)
LTM maint complete - no skin breakdown under:  FP2 FP1 continue to monitor

## 2021-04-23 NOTE — Consult Note (Addendum)
WOC Nurse Consult Note: Reason for Consult:Chronic nonhealing sacral wound, seen at wound care center monthly.Treating for chronic osteomyelitis New onset blistering to arms, thighs and axilla one week ago. Had seizure in wound care office 04/22/21 and was sent to ED. Wound type: inflammatory blistering and pressure to sacrum, chronic Pressure Injury POA: Yes Measurement: 2 cm x2.1 cm x 1 cm sacrum Blisters are all around 1 cm in diameter and round, ruptured serum filled blisters.   Left third phalanges' wound is dry and scabbed. 0.5 cm in diameter  Wound BEE:FEOFH red Drainage (amount, consistency, odor) minimal serosanguinous  no odor.  Periwound: Dry skin Dressing procedure/placement/frequency:Cleanse sacral wound with NS and pat dry. Apply Aquacel ag to wound bed. [facility] # [id number]) Cover with sacral foam.  Change every three days, please date dressing.  Blistering to left hand, bilateral arms, thighs and left axilla:  Clean wounds with NS and pat dry. Apply small piece of Mepitel silicone contact layer ([facility] # [id number]) to wound bed and cover with foam dressing.  Date dressing and change every three days (along with sacral wound)  Prevalon boots to bilateral heels Low air loss mattress Will not follow at this time.  Please re-consult if needed.  Domenic Moras MSN, RN, FNP-BC [facility]ON Wound, Ostomy, Continence Nurse Pager [phone]

## 2021-04-23 NOTE — Progress Notes (Signed)
Patient transported to MRI and back without complications. RN at bedside. 

## 2021-04-23 NOTE — Progress Notes (Signed)
Neurology Progress Note   S:// Intubated.  Sedation with fentanyl held about 40 minutes prior to this exam. No overnight seizures reported.    O:// Current vital signs: BP (!) 104/56   Pulse (!) 110   Temp 98.5 F (36.9 C) (Axillary)   Resp 15   Ht 6' (1.829 m)   Wt 65.4 kg   SpO2 100%   BMI 19.55 kg/m  Vital signs in last 24 hours: Temp:  [95.9 F (35.5 C)-99.7 F (37.6 C)] 98.5 F (36.9 C) (09/22 0756) Pulse Rate:  [84-123] 110 (09/22 0808) Resp:  [8-25] 15 (09/22 0808) BP: (52-154)/(38-89) 104/56 (09/22 0800) SpO2:  [97 %-100 %] 100 % (09/22 0810) FiO2 (%):  [30 %-40 %] 30 % (09/22 0810) Weight:  [65.4 kg-79.4 kg] 65.4 kg (09/22 0500) General: Intubated, was on sedation with fentanyl that was held about 30 minutes prior to the exam. HEENT: Normocephalic/atraumatic CVs: Regular rhythm Respiratory: Vented Abdomen nontender nondistended Neurological exam Intubated Open eyes to noxious stimulation Did not follow commands Pupils are pinpoint bilaterally No gaze preference or deviation He has increased tone in bilateral upper extremities right greater than left and decreased muscle mass in all 4 extremities. No withdrawal to noxious stimulation in all 4 extremities   Medications  Current Facility-Administered Medications:    0.9 %  sodium chloride infusion, , Intravenous, Continuous, Heloise Purpura [facility], Last Rate: 125 mL/hr at 04/23/21 0800, Infusion Verify at 04/23/21 0800   acyclovir (ZOVIRAX) 795 mg in dextrose 5 % 150 mL IVPB, 10 mg/kg, Intravenous, Q12H, Heloise Purpura, RPH, Stopped at 04/22/21 2128   albuterol (PROVENTIL) (2.5 MG/3ML) 0.083% nebulizer solution 2.5 mg, 2.5 mg, Nebulization, Q2H PRN, Ayesha Rumpf, Stephanie M, PA-C   budesonide (PULMICORT) nebulizer solution 0.25 mg, 0.25 mg, Nebulization, BID, [place] Crane M, PA-C, 0.25 mg at 04/23/21 7517   chlorhexidine gluconate (MEDLINE KIT) (PERIDEX) 0.12 % solution 15 mL, 15 mL, Mouth Rinse, BID,  Renee Pain, MD, 15 mL at 04/23/21 0745   Chlorhexidine Gluconate Cloth 2 % PADS 6 each, 6 each, Topical, Daily, Renee Pain, MD   docusate (COLACE) 50 MG/5ML liquid 100 mg, 100 mg, Per Tube, BID, Ayesha Rumpf, Stephanie M, PA-C   famotidine (PEPCID) IVPB 20 mg in NS 100 mL IVPB, 20 mg, Intravenous, Q24H, [place] Crane M, PA-C, Stopped at 04/23/21 0523   fentaNYL (SUBLIMAZE) bolus via infusion 25 mcg, 25 mcg, Intravenous, Q1H PRN, Hong, Greggory Brandy, MD   fentaNYL (SUBLIMAZE) injection 50 mcg, 50 mcg, Intravenous, Once, [place], Greggory Brandy, MD   fentaNYL 2556mcg in NS 210mL (3mcg/ml) infusion-PREMIX, 25-400 mcg/hr, Intravenous, Continuous, Hong, Greggory Brandy, MD, Last Rate: 14.5 mL/hr at 04/23/21 0800, 145 mcg/hr at 04/23/21 0800   heparin injection 5,000 Units, 5,000 Units, Subcutaneous, Q8H, Nevada Crane M, PA-C, 5,000 Units at 04/23/21 0535   insulin aspart (novoLOG) injection 0-9 Units, 0-9 Units, Subcutaneous, Q4H, Reese, Stephanie M, PA-C   ipratropium-albuterol (DUONEB) 0.5-2.5 (3) MG/3ML nebulizer solution 3 mL, 3 mL, Nebulization, Q6H, Ayesha Rumpf, Stephanie M, PA-C, 3 mL at 04/23/21 0017   levETIRAcetam (KEPPRA) IVPB 500 mg/100 mL premix, 500 mg, Intravenous, Q12H, Renee Pain, MD   LORazepam (ATIVAN) injection 2 mg, 2 mg, Intravenous, Once PRN, Estill Cotta, NP   magnesium sulfate IVPB 2 g 50 mL, 2 g, Intravenous, Once, Estill Cotta, NP   MEDLINE mouth rinse, 15 mL, Mouth Rinse, 10 times per day, Renee Pain, MD, 15 mL at 04/23/21 0521   polyethylene glycol (MIRALAX / GLYCOLAX) packet  17 g, 17 g, Per Tube, Daily PRN, Lestine Mount, PA-C Labs CBC    Component Value Date/Time   WBC 12.7 (H) 04/23/2021 0620   RBC 2.80 (L) 04/23/2021 0620   HGB 8.4 (L) 04/23/2021 0620   HCT 26.2 (L) 04/23/2021 0620   HCT 33.2 (L) 06/01/2019 2202   PLT  04/23/2021 0620    PLATELET CLUMPS NOTED ON SMEAR, COUNT APPEARS ADEQUATE   MCV 93.6 04/23/2021 0620   MCH 30.0 04/23/2021 0620    MCHC 32.1 04/23/2021 0620   RDW 14.2 04/23/2021 0620   LYMPHSABS 1.8 04/22/2021 1624   MONOABS 1.0 04/22/2021 1624   EOSABS 4.2 (H) 04/22/2021 1624   BASOSABS 0.0 04/22/2021 1624    CMP     Component Value Date/Time   NA 140 04/23/2021 0620   NA 142 11/22/2019 0000   K 4.9 04/23/2021 0620   CL 106 04/23/2021 0620   CO2 27 04/23/2021 0620   GLUCOSE 101 (H) 04/23/2021 0620   BUN 25 (H) 04/23/2021 0620   BUN 22 (A) 11/22/2019 0000   CREATININE 1.08 04/23/2021 0620   CREATININE 1.63 (H) 12/01/2018 1032   CALCIUM 8.3 (L) 04/23/2021 0620   PROT 5.7 (L) 04/23/2021 0620   ALBUMIN 2.4 (L) 04/23/2021 0620   AST 24 04/23/2021 0620   ALT 19 04/23/2021 0620   ALKPHOS 59 04/23/2021 0620   BILITOT 0.7 04/23/2021 0620   GFRNONAA >60 04/23/2021 0620   GFRNONAA 36 (L) 12/01/2018 1032   GFRAA >60 12/07/2019 0558   GFRAA 41 (L) 12/01/2018 1032   Overnight LTM EEG with no evidence of seizures.  Continuous slow generalized.  CSF studies in the emergency room not consistent with a CNS infection.  Imaging I have reviewed images in epic and the results pertinent to this consultation are: No new imaging  Assessment: 87 year old, past medical history of dementia with behavioral disturbance, depression, hyperlipidemia, hyperglycemia, prior history of stroke with residual right-sided weakness, brought in with seizure-like activity noted at the doctor's office Test right arm shaking and facial twitching.  Received multiple doses of benzodiazepines and had to be intubated for airway protection. EEG and long-term EEG overnight negative for ongoing seizure activity. 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  LP was done due to concern for rash on the skin and now new onset seizures-CSF analysis not very likely for meningitis/encephalitis.  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  MRI should also be able to help with diagnostics should it show any sort of temporal lesions. Plan discussed with Dr. Lynetta Mare  -- Amie Portland, MD Neurologist [facility] Pager: [phone]  CRITICAL CARE ATTESTATION Performed by: Amie Portland, MD Total critical care time: 31 minutes Critical care time was exclusive of separately billable procedures and treating other patients and/or supervising APPs/Residents/Students Critical care was necessary to treat or prevent imminent or life-threatening deterioration due to seizure, status epilepticus  This patient is critically ill and at significant risk for neurological worsening and/or death and care requires constant monitoring. Critical care was time spent personally by me on the following activities: development of treatment plan with patient and/or surrogate as well as nursing, discussions with consultants, evaluation of patient's response to treatment, examination of patient, obtaining history from patient or surrogate,  ordering and performing treatments and interventions, ordering and review of laboratory studies, ordering and review of radiographic studies, pulse oximetry, re-evaluation of patient's condition, participation in multidisciplinary rounds and medical decision making of high complexity in the care of this patient.

## 2021-04-23 NOTE — Progress Notes (Signed)
eLink Physician-Brief Progress Note Patient Name: Keifer Habib DOB: [birth year] MRN: [id number]   Date of Service  04/23/2021  HPI/Events of Note  Hypotension - BO = 89/51 with MAP = 61. Last LVEF = 60-65%.  eICU Interventions  Plan: Bolus with 0.9 NaCl 1 liter IV over 1 hour now.     Intervention Category Major Interventions: Hypotension - evaluation and management  Riyanshi Wahab Cornelia Copa 04/23/2021, 2:48 AM

## 2021-04-23 NOTE — Progress Notes (Signed)
Pt transported from ED24 to 2M35 with no complications.

## 2021-04-23 NOTE — Progress Notes (Signed)
Nutrition Follow-up  DOCUMENTATION CODES:   Non-severe (moderate) malnutrition in context of chronic illness  INTERVENTION:   Initiate tube feeds via OG tube: - Start Vital AF 1.2 @ 20 ml/hr and advance by 10 ml q 6 hours to goal rate of 60 ml/hr (1440 ml/day)  Tube feeding regimen at goal provides 1728 kcal, 108 grams of protein, and 1168 ml of H2O.  - MVI with minerals daily per tube  Monitor magnesium, potassium, and phosphorus BID for at least 3 days, MD to replete as needed, as pt is at risk for refeeding syndrome given malnutrition.  NUTRITION DIAGNOSIS:   Moderate Malnutrition related to chronic illness (dementia) as evidenced by moderate fat depletion, severe muscle depletion.  GOAL:   Patient will meet greater than or equal to 90% of their needs  MONITOR:   Vent status, Labs, Weight trends, TF tolerance, Skin  REASON FOR ASSESSMENT:   Ventilator, Consult Enteral/tube feeding initiation and management  ASSESSMENT:   87 year old male who presented to the ED on 9/21 after having seizure-like activity. Pt required intubation in the ED. PMH of dementia with behavioral disturbance, depression, HLD, HTN, stroke, T2DM, CAD, wheelchair-bound at baseline.  Discussed pt with RN and during ICU rounds. Consult received for tube feeding initiation and management. OG tube coiled within the stomach; however, tip pointing towards pylorus.  Spoke with pt's daughter at bedside. She reports that pt eats pureed foods at baseline and has been doing so for about 2 years. She also reports that she has thickener for pt's beverages but does not use it consistently. Per pt's daughter, pt has a good appetite and eats 3 meals and occasionally 1 snack daily. An example of a breakfast meal is oatmeal, yogurt, and banana. She states that pt drinks 1-2 Ensure supplements daily and that the [place] gave him Juven which he consumes twice daily.  Pt's daughter reports that she can tell visually that he has  lost weight. She reports that his UBW is 180-185 lbs and that he last weighed this ~2 years ago. Reviewed weight history in chart. Pt with a 14 kg weight loss since 12/05/19. This is a 17.6% weight loss in over a year which is not quite significant for timeframe. However, weight loss may have occurred more acutely. Weight loss is concerning given pt with malnutrition.  Will start tube feeds at low rate and slowly advance to goal. Pt at refeeding risk given malnutrition. Will also order daily MVI to aid in wound healing.  Pt with severe muscle depletions to BLE. However, pt is wheelchair-bound at baseline so suspect depletions related in part to lack of use.  Patient is currently intubated on ventilator support MV: 8.6 L/min Temp (24hrs), Avg:98 F (36.7 C), Min:95.9 F (35.5 C), Max:99.7 F (37.6 C)  Drips: Fentanyl NS: 125 ml/hr  Medications reviewed and include: colace, IV pepcid, SSI q 4 hours, acyclovir  Labs reviewed: BUN 25, hemoglobin 8.4 CBG's: 83-178 x 24 hours  UOP: 1025 ml x 12 hours I/O's: +2.9 L since admit  NUTRITION - FOCUSED PHYSICAL EXAM:  Flowsheet Row Most Recent Value  Orbital Region Severe depletion  Upper Arm Region Moderate depletion  Thoracic and Lumbar Region Moderate depletion  Buccal Region Unable to assess  Temple Region Moderate depletion  Clavicle Bone Region Severe depletion  Clavicle and Acromion Bone Region Severe depletion  Scapular Bone Region Unable to assess  Dorsal Hand Moderate depletion  Patellar Region Severe depletion  Anterior Thigh Region Severe depletion  Posterior  Calf Region Severe depletion  Edema (RD Assessment) None  Hair Reviewed  Eyes Unable to assess  Mouth Unable to assess  Skin Reviewed  Nails Reviewed       Diet Order:   Diet Order             Diet NPO time specified  Diet effective now                   EDUCATION NEEDS:   Not appropriate for education at this time  Skin:  Skin Assessment: Skin  Integrity Issues: Other: pressure injury to coccyx (no stage noted)  Last BM:  04/23/21  Height:   Ht Readings from Last 1 Encounters:  04/22/21 6' (1.829 m)    Weight:   Wt Readings from Last 1 Encounters:  04/23/21 65.4 kg    BMI:  Body mass index is 19.55 kg/m.  Estimated Nutritional Needs:   Kcal:  1600-1800  Protein:  90-110 grams  Fluid:  1.6-1.8 L    Gustavus Bryant, MS, RD, LDN Inpatient Clinical Dietitian Please see AMiON for contact information.

## 2021-04-23 NOTE — Procedures (Addendum)
Patient Name: Finneas Mathe  MRN: [id number]  Epilepsy Attending: Lora Havens  Referring Physician/Provider: Dr Amie Portland Duration: 04/22/2021 1857 to 04/23/2021 1157  Patient history: a 87 year old with no prior history of seizures, other medical history as documented above with acute onset of right face and arm twitching requiring multiple doses of benzodiazepine eventually requiring intubation due to inability protect airway. EEG to evaluate for seizures.  Level of alertness: comatose  AEDs during EEG study: Ativan  Technical aspects: This EEG study was done with scalp electrodes positioned according to the 10-20 International system of electrode placement. Electrical activity was acquired at a sampling rate of 500Hz  and reviewed with a high frequency filter of 70Hz  and a low frequency filter of 1Hz . EEG data were recorded continuously and digitally stored.   Description: EEG showed continuous generalized low amplitude 3 -4 Hz delta slowing. Hyperventilation and photic stimulation were not performed.     ABNORMALITY - Continuous slow, generalized  IMPRESSION: This study is suggestive of severe diffuse encephalopathy, nonspecific etiology. No seizures or epileptiform discharges were seen throughout the recording.  Shaunn Tackitt Barbra Sarks

## 2021-04-23 NOTE — Progress Notes (Signed)
Received pt from ED. Pt with skin lesions located on left middle finger, bilateral forearms, left upper arm, left axillary region, and bilateral upper thighs upon admission. WOC consult placed.

## 2021-04-24 DIAGNOSIS — D649 Anemia, unspecified: Secondary | ICD-10-CM | POA: Diagnosis not present

## 2021-04-24 DIAGNOSIS — R238 Other skin changes: Secondary | ICD-10-CM | POA: Diagnosis not present

## 2021-04-24 DIAGNOSIS — G40901 Epilepsy, unspecified, not intractable, with status epilepticus: Secondary | ICD-10-CM | POA: Diagnosis not present

## 2021-04-24 DIAGNOSIS — Z978 Presence of other specified devices: Secondary | ICD-10-CM | POA: Diagnosis not present

## 2021-04-24 LAB — COMPREHENSIVE METABOLIC PANEL
ALT: 22 U/L (ref 0–44)
AST: 34 U/L (ref 15–41)
Albumin: 2.4 g/dL — ABNORMAL LOW (ref 3.5–5.0)
Alkaline Phosphatase: 58 U/L (ref 38–126)
Anion gap: 6 (ref 5–15)
BUN: 20 mg/dL (ref 8–23)
CO2: 24 mmol/L (ref 22–32)
Calcium: 8.1 mg/dL — ABNORMAL LOW (ref 8.9–10.3)
Chloride: 107 mmol/L (ref 98–111)
Creatinine, Ser: 1.07 mg/dL (ref 0.61–1.24)
GFR, Estimated: >60 mL/min (ref >60)
Glucose, Bld: 132 mg/dL — ABNORMAL HIGH (ref 70–99)
Potassium: 4 mmol/L (ref 3.5–5.1)
Sodium: 137 mmol/L (ref 135–145)
Total Bilirubin: 0.6 mg/dL (ref 0.3–1.2)
Total Protein: 5.7 g/dL — ABNORMAL LOW (ref 6.5–8.1)

## 2021-04-24 LAB — CBC
HCT: 23.8 % — ABNORMAL LOW (ref 39.0–52.0)
Hemoglobin: 7.6 g/dL — ABNORMAL LOW (ref 13.0–17.0)
MCH: 29.5 pg (ref 26.0–34.0)
MCHC: 31.9 g/dL (ref 30.0–36.0)
MCV: 92.2 fL (ref 80.0–100.0)
Platelets: 257 10*3/uL (ref 150–400)
RBC: 2.58 MIL/uL — ABNORMAL LOW (ref 4.22–5.81)
RDW: 14.7 % (ref 11.5–15.5)
WBC: 9.9 10*3/uL (ref 4.0–10.5)
nRBC: 0 % (ref 0.0–0.2)

## 2021-04-24 LAB — GLUCOSE, CAPILLARY
Glucose-Capillary: 137 mg/dL — ABNORMAL HIGH (ref 70–99)
Glucose-Capillary: 120 mg/dL — ABNORMAL HIGH (ref 70–99)
Glucose-Capillary: 119 mg/dL — ABNORMAL HIGH (ref 70–99)
Glucose-Capillary: 130 mg/dL — ABNORMAL HIGH (ref 70–99)
Glucose-Capillary: 73 mg/dL (ref 70–99)
Glucose-Capillary: 74 mg/dL (ref 70–99)

## 2021-04-24 LAB — MAGNESIUM
Magnesium: 2.2 mg/dL (ref 1.7–2.4)
Magnesium: 2.1 mg/dL (ref 1.7–2.4)

## 2021-04-24 LAB — PHOSPHORUS
Phosphorus: 2.3 mg/dL — ABNORMAL LOW (ref 2.5–4.6)
Phosphorus: 2.6 mg/dL (ref 2.5–4.6)

## 2021-04-24 MED ORDER — ESCITALOPRAM OXALATE 10 MG PO TABS: 5.0000 mg | ORAL_TABLET | Freq: Every day | ORAL | Status: DC

## 2021-04-24 MED ORDER — IPRATROPIUM-ALBUTEROL 0.5-2.5 (3) MG/3ML IN SOLN
3.0000 mL | Freq: Two times a day (BID) | RESPIRATORY_TRACT | Status: DC
  Administered 2021-04-25 – 2021-05-01 (×12): 3 mL via RESPIRATORY_TRACT
  Filled 2021-04-24 (×13): qty 3

## 2021-04-24 MED ORDER — METOPROLOL TARTRATE 12.5 MG HALF TABLET: 12.5000 mg | ORAL_TABLET | Freq: Two times a day (BID) | ORAL | Status: DC

## 2021-04-24 MED ORDER — IPRATROPIUM-ALBUTEROL 0.5-2.5 (3) MG/3ML IN SOLN: 3.0000 mL | Freq: Two times a day (BID) | RESPIRATORY_TRACT | Status: DC

## 2021-04-24 MED ORDER — ADULT MULTIVITAMIN W/MINERALS CH: 1.0000 | ORAL_TABLET | Freq: Every day | ORAL | Status: DC

## 2021-04-24 MED ORDER — ASPIRIN 81 MG PO CHEW: 81.0000 mg | CHEWABLE_TABLET | Freq: Every day | ORAL | Status: DC

## 2021-04-24 MED ORDER — METOPROLOL TARTRATE 12.5 MG HALF TABLET
12.5000 mg | ORAL_TABLET | Freq: Two times a day (BID) | ORAL | Status: DC
  Administered 2021-04-24: 12.5 mg
  Filled 2021-04-24: qty 1

## 2021-04-24 MED ORDER — DEXTROSE 10 % IV SOLN: INTRAVENOUS | Status: DC

## 2021-04-24 MED ORDER — DOCUSATE SODIUM 50 MG/5ML PO LIQD: 100.0000 mg | Freq: Two times a day (BID) | ORAL | Status: DC

## 2021-04-24 NOTE — TOC Initial Note (Signed)
Transition of Care [facility]) - Initial/Assessment Note    Patient Details  Name: [name] MRN: [id number] Date of Birth: July 12, 1922  Transition of Care [facility]) CM/SW Contact:    Tom-Johnson, Renea Ee, RN Phone Number: 04/24/2021, 5:12 PM  Clinical Narrative:                 CM spoke with patient's daughter, [name] at patient's bedside. Patient has been extubated and asleep at the time. [name] states patient lives with her. Patient has 4 children and all are supportive of his care. Patient has been bedridden since 2020 after he suffered a stroke. Has hospital bed, hoyer lift, wheelchair at home. [facility]ses [facility]. Patient has a chronic wound to his sacrum. A nurse named Fanetta from the [place] comes every 3 months to evaluate and assess patient. [employer] provides transportation to and from appointments and daughter does his errands. Palliative consulting. Awaiting PT/OT eval. CM will continue to follow with needs.   Barriers to Discharge: Continued Medical Work up   Patient Goals and CMS Choice Patient states their goals for this hospitalization and ongoing recovery are:: To go home      Expected Discharge Plan and Services     Discharge Planning Services: CM Consult   Living arrangements for the past 2 months: Single Family Home                                      Prior Living Arrangements/Services Living arrangements for the past 2 months: Single Family Home Lives with:: Adult Children (Daughter, [name]) Patient language and need for interpreter reviewed:: Yes Do you feel safe going back to the place where you live?: Yes      Need for Family Participation in Patient Care: Yes (Comment) Care giver support system in place?: Yes (comment) Current home services: DME, Home RN [facility] bed, Lift, wheelchair) Criminal Activity/Legal Involvement Pertinent to Current Situation/Hospitalization: No - Comment as needed  Activities of Daily Living       Permission Sought/Granted Permission sought to share information with : Case Manager, Family Supports, Customer service manager Permission granted to share information with : Yes, Verbal Permission Granted              Emotional Assessment Appearance:: Appears stated age Attitude/Demeanor/Rapport: Engaged Affect (typically observed): Accepting, Appropriate, Hopeful Orientation: :  (UTA. Just extubated and asleep.) Alcohol / Substance Use: Not Applicable Psych Involvement: No (comment)  Admission diagnosis:  Vesicular rash [R23.8] Seizure ([place]) [R56.9] Acute respiratory failure, unspecified whether with hypoxia or hypercapnia ([place]) [J96.00] Patient Active Problem List   Diagnosis Date Noted   Malnutrition of moderate degree 04/23/2021   Status epilepticus ([place]) 04/22/2021   Normocytic anemia 04/22/2021   Skin bulla 04/22/2021   Endotracheally intubated 04/22/2021   Abnormal CXR 04/22/2021   Advanced dementia ([place])    Goals of care, counseling/discussion    Palliative care by specialist    Osteomyelitis ([place]) 12/05/2019   Multiple skin tears    Pressure injury of sacral region, stage 4 ([place]) 10/12/2019   Vascular dementia with behavior disturbance ([place]) 10/12/2019   Bilateral hearing loss due to cerumen impaction 10/12/2019   Debility 10/12/2019   Pneumonia due to 2019 novel coronavirus 07/06/2019   Pneumonia due to COVID-19 virus 07/05/2019   Pulmonary embolism ([place]) 06/01/2019   Acute renal failure superimposed on stage 3 chronic [name] disease ([place]) 06/01/2019  Chronic diastolic CHF (congestive heart failure) ([place]) 06/01/2019   Pain due to onychomycosis of toenails of both feet 01/23/2019   Bilateral lower extremity edema 07/05/2017   Pulmonary nodules 04/13/2017   Centrilobular emphysema ([place]) 04/13/2017   Calculus of gallbladder without cholecystitis without obstruction 04/13/2017   Chronic cough 04/13/2017   Adenoma of left adrenal gland 04/13/2017    Frequent falls 04/01/2017   Type 2 diabetes mellitus with stage 3 chronic [name] disease, with long-term current use of insulin ([place]) 04/01/2017   History of tobacco abuse 04/01/2017   Unsteady gait 04/01/2017   Dysarthria 04/01/2017   History of stroke    Hyperlipidemia    End of life care 11/19/2015   Moderate dementia with behavioral disturbance ([place]) 09/19/2015   Depression 09/19/2015   Hypertensive heart disease 09/10/2015   Type 2 diabetes, controlled, with peripheral circulatory disorder ([place])    Essential hypertension    Coronary artery disease involving native coronary artery of native heart with angina pectoris ([place])    Arthritis    Glaucoma    PCP:  Patient, No Pcp Per (Inactive) Pharmacy:   [facility] [address], [place] - [address] [place] [place] [address] Phone: [phone] Fax: [phone]     Social Determinants of Health (SDOH) Interventions    Readmission Risk Interventions Readmission Risk Prevention Plan 12/07/2019  Transportation Screening Complete  PCP or Specialist Appt within 3-5 Days Complete  HRI or [place] Complete  Social Work Consult for [place] Planning/Counseling Complete  Palliative Care Screening Complete  Medication Review Press photographer) Complete  Some recent data might be hidden

## 2021-04-24 NOTE — Progress Notes (Signed)
NAMENickolis [name] MRN:  [id number] DOB:  09-19-1921 LOS: 2 ADMISSION DATE:  04/22/2021 DATE OF SERVICE:  04/22/2021  CHIEF COMPLAINT:  new onset seizures   HISTORY & PHYSICAL  History of Present Illness  This 87 y.o. African American male reformed smoker presented to the [facility] Emergency Department via EMS with complaints of new onset seizure disorder.  The patient was intubated in the ER for airway protection.  PCCM asked to admit.  [facility] the [place], where he presented for a follow up visit for evaluation of bullous skin lesions, skin tears and sacral decubitus.  During his visit, while undergoing examination, he was observed to suddenly develop seizure-like activity with pupils deviated to the right side (per the patient's daughter).  His seizure activity was treated with Versed 5 mg single dose by EMS, which reportedly resulted in suppression of seizure activity.  In the ER, however, he was noted to still have some facial twitching which was suspected to be ongoing seizure activity.  He received 3 additional doses of Ativan 2 mg IV and was loaded with Keppra.  Neurology consulted and following.  The patinet lives with his daughter, who is his primary caregiver.  She reports that the patient was at baseline in terms of mentation and activity (wheelchair-bound as a sequela of previous stroke) leading up to the onset of seizures today.  The patient remains critically ill  Pertinent PMX  CAD with DES 2014, HTN, HLD, Dementia, Stroke, DMII, stage 4 PI  Procedures:  9/21 LP in ER, intubated in ER  Significant Diagnostic Tests:  9/21 CTH negative for acute process 9/22 MRI negative for acute process  COVID - Micro Data:  COVID negative TA>  BC> CSF>  Antimicrobials:  Acyclovir (9/21>>)   Interim history/subjective:  Tmax 100  +5.4L admit, 680 UOP,   Intubated  MRI with no acute findings  Unable to  obtain subjective evaluation due to patient status  Objective   BP 134/84   Pulse (!) 121   Temp 100 F (37.8 C) (Axillary)   Resp 17   Ht 6' (1.829 m)   Wt 68 kg   SpO2 100%   BMI 20.33 kg/m     Filed Weights   04/22/21 1620 04/23/21 0500 04/24/21 0500  Weight: 79.4 kg 65.4 kg 68 kg    Intake/Output Summary (Last 24 hours) at 04/24/2021 0743 Last data filed at 04/24/2021 0700 Gross per 24 hour  Intake 3350.17 ml  Output 680 ml  Net 2670.17 ml    Vent Mode: PRVC FiO2 (%):  [30 %-40 %] 40 % Set Rate:  [14 bmp] 14 bmp Vt Set:  [620 mL] 620 mL PEEP:  [5 cmH20] 5 cmH20 Pressure Support:  [12 cmH20] 12 cmH20 Plateau Pressure:  [20 cmH20-22 cmH20] 20 cmH20   Examination: General:  in bed, cachectic, NAD HEENT: MM pink/moist, anicteric, atraumatic Neuro: opens eyes to pain, toe wiggles to command, PEERL 68mm CV: S1S2,  ST, no m/r/g appreciated PULM:  air movement in all lobes, Trachea midline, chest expansion symmetric GI: soft, bsx4 active, non distended   Extremities: warm/dry,  no pretibial edema, capillary refill less than 3 seconds  Skin: Sacral PI, bulla on L 3 digit pip, L axillary skin tears, L groin,    Resolved Hospital Problem list   Hypotension   Assessment & Plan:   Status epilepticus Acute encephalopathy HX Dementia and stroke- hx of  right sided weakness, in wheelchair, prior head ct with chronic left frontal encephalomalacia. Presented with seizure while at wound care clinic. ? Of seizure on patient birthday in early September. Head CT negative for acute changes. Neurology with suspicion of post stroke epilepsy. No seizure on EKG. WBC 12.7>9.9. Afebrile. CSF Glucose 83, Protein 62, WBC 1-3, clear fluid, suspect non bacterial. MRI negative for acute changes. HSV CSF culture pending. UA neg -Neurology following. Workup and management per neurology -Continue keppra 500 BID -Follow up LP cultures, NGTD -Continue acyclovir, will d/c based on HSV csf results.  Neurology feels viral encephalitis less likely. -Follow up BC, NGTD -Palliative care consult  Lactic acidosis- suspect secondary to seizure 3.7>2,3>3.0, overall downtrend.  -Supportive care -Goal map 65 or greater  Endotracheally intubated Centrilobular emphysema Intubated for airway protection. CXR stable tube, no infiltrate -LTVV strategy with tidal volumes of 4-8 cc/kg ideal body weight -Goal plateau pressures less than 30 and driving pressures less than 15 -Wean PEEP/FiO2 for SpO2 92-98% -VAP bundle -Daily SAT and SBT -RASS goal 0 -Follow intermittent CXR and ABG PRN -Hope to extubate today  DM2 88-178, A1C 6.5 -Blood Glucose goal 140-180. -SSI  Normocytic anemia HGB 8.4>7.6 . No signs of bleeding -Transfuse PRBC if HBG less than 7 -Obtain AM CBC to trend H&H -Monitor for signs of bleeding  Stage 4 PI, POA Skin bulla POA Per family began about 1 week ago PTA. -Seen by wound care. Care by [facility] orders -Follow up punch biopsy  Hypomagnesia- resolved MG 1.7>2.2  HX HTN, HLD, CAD (DES to LAD 06/2013) -plan to resume home metoprolol at 1/2 dose today  Best Practice (right click and "Reselect all SmartList Selections" daily)   Diet/type: tubefeeds DVT prophylaxis: prophylactic heparin  GI prophylaxis: H2B Central venous access:  N/A Foley:  removal ordered  Code Status:  full code Last date of multidisciplinary goals of care discussion [Pending]     Critical care time: 30 Minutes The treatment and management of the patient's condition was required based on the threat of imminent deterioration. This time reflects time spent by the APP evaluating, providing care and managing the critically ill patient's care. The time was spent at the immediate bedside (or on the same floor/unit and dedicated to this patient's care). Time involved in separately billable procedures is NOT included in the critical care time indicated above. Family meeting and update time may be  included above if and only if the patient is unable/incompetent to participate in clinical interview and/or decision making, and the discussion was necessary to determining treatment decisions.   Redmond School., MSN, APRN, AGACNP-BC [facility]  04/24/2021 , 7:43 AM  Please see [email or web address] for pager details  If no response, please call [phone] After hours, please call Elink at [phone]

## 2021-04-24 NOTE — Progress Notes (Signed)
Neurology Progress Note   S:// No acute events overnight Primary team getting ready to extubate him. Still not following commands. Sedation has now been held for greater than 24 hours    O:// Current vital signs: BP 131/73   Pulse (!) 103   Temp 99.3 F (37.4 C) (Axillary)   Resp 20   Ht 6' (1.829 m)   Wt 68 kg   SpO2 100%   BMI 20.33 kg/m  Vital signs in last 24 hours: Temp:  [98 F (36.7 C)-100.7 F (38.2 C)] 99.3 F (37.4 C) (09/23 1140) Pulse Rate:  [92-122] 103 (09/23 1100) Resp:  [0-29] 20 (09/23 1100) BP: (88-143)/(47-84) 131/73 (09/23 1100) SpO2:  [100 %] 100 % (09/23 1100) FiO2 (%):  [40 %] 40 % (09/23 0842) Weight:  [68 kg] 68 kg (09/23 0500) General: Intubated, on no sedation HEENT: Normocephalic/atraumatic CVs: Regular rhythm Respiratory: Vented Abdomen nontender nondistended Neurological exam Intubated Open eyes to noxious stimulation Did not follow commands Pupils are pinpoint bilaterally Seem to have mildly rightward gaze preference. He has increased tone in bilateral upper extremities right greater than left and decreased muscle mass in all 4 extremities. No withdrawal to noxious stimulation in all 4 extremities   Medications  Current Facility-Administered Medications:    acyclovir (ZOVIRAX) 795 mg in dextrose 5 % 150 mL IVPB, 10 mg/kg, Intravenous, Q12H, Heloise Purpura, RPH, Last Rate: 165.9 mL/hr at 04/24/21 1145, 795 mg at 04/24/21 1145   albuterol (PROVENTIL) (2.5 MG/3ML) 0.083% nebulizer solution 2.5 mg, 2.5 mg, Nebulization, Q2H PRN, Ayesha Rumpf, Stephanie M, PA-C   aspirin chewable tablet 81 mg, 81 mg, Per Tube, Daily, Estill Cotta, NP, 81 mg at 04/24/21 0923   bacitracin ointment 1 application, 1 application, Topical, BID, Agarwala, Ravi, MD, 1 application at 89/21/19 0924   budesonide (PULMICORT) nebulizer solution 0.25 mg, 0.25 mg, Nebulization, BID, [place] Crane M, PA-C, 0.25 mg at 04/24/21 0840   chlorhexidine gluconate  (MEDLINE KIT) (PERIDEX) 0.12 % solution 15 mL, 15 mL, Mouth Rinse, BID, Renee Pain, MD, 15 mL at 04/24/21 4174   Chlorhexidine Gluconate Cloth 2 % PADS 6 each, 6 each, Topical, Daily, Renee Pain, MD, 6 each at 04/24/21 0925   clobetasol cream (TEMOVATE) 0.81 % 1 application, 1 application, Topical, BID, Agarwala, Ravi, MD, 1 application at 44/81/85 0925   docusate (COLACE) 50 MG/5ML liquid 100 mg, 100 mg, Per Tube, BID, [place] Crane M, PA-C, 100 mg at 04/24/21 6314   escitalopram (LEXAPRO) tablet 5 mg, 5 mg, Per Tube, Daily, Estill Cotta, NP, 5 mg at 04/24/21 0923   famotidine (PEPCID) IVPB 20 mg in NS 100 mL IVPB, 20 mg, Intravenous, Q24H, Reese, Stephanie M, PA-C, Stopped at 04/24/21 0126   feeding supplement (VITAL AF 1.2 CAL) liquid 1,000 mL, 1,000 mL, Per Tube, Continuous, Agarwala, Ravi, MD, Last Rate: 40 mL/hr at 04/24/21 0300, Rate Change at 04/24/21 0300   heparin injection 5,000 Units, 5,000 Units, Subcutaneous, Q8H, Reese, Stephanie M, PA-C, 5,000 Units at 04/24/21 0555   insulin aspart (novoLOG) injection 0-9 Units, 0-9 Units, Subcutaneous, Q4H, [place] Crane M, PA-C, 1 Units at 04/24/21 0428   ipratropium-albuterol (DUONEB) 0.5-2.5 (3) MG/3ML nebulizer solution 3 mL, 3 mL, Nebulization, Q6H, Reese, Stephanie M, PA-C, 3 mL at 04/24/21 0840   levETIRAcetam (KEPPRA) IVPB 500 mg/100 mL premix, 500 mg, Intravenous, Q12H, Renee Pain, MD, Last Rate: 400 mL/hr at 04/24/21 0933, 500 mg at 04/24/21 0933   lidocaine HCl (PF) (XYLOCAINE) 2 % injection 0-20  mL, 0-20 mL, Intradermal, Once PRN, Agarwala, Ravi, MD   MEDLINE mouth rinse, 15 mL, Mouth Rinse, 10 times per day, Renee Pain, MD, 15 mL at 04/24/21 1147   metoprolol tartrate (LOPRESSOR) tablet 12.5 mg, 12.5 mg, Per Tube, BID, Brewington, Eden E, RPH, 12.5 mg at 04/24/21 2010   multivitamin with minerals tablet 1 tablet, 1 tablet, Per Tube, Daily, Kipp Brood, MD, 1 tablet at 04/24/21 0923   polyethylene  glycol (MIRALAX / GLYCOLAX) packet 17 g, 17 g, Per Tube, Daily PRN, Lestine Mount, PA-C Labs CBC    Component Value Date/Time   WBC 9.9 04/24/2021 0450   RBC 2.58 (L) 04/24/2021 0450   HGB 7.6 (L) 04/24/2021 0450   HCT 23.8 (L) 04/24/2021 0450   HCT 33.2 (L) 06/01/2019 2202   PLT 257 04/24/2021 0450   MCV 92.2 04/24/2021 0450   MCH 29.5 04/24/2021 0450   MCHC 31.9 04/24/2021 0450   RDW 14.7 04/24/2021 0450   LYMPHSABS 1.8 04/22/2021 1624   MONOABS 1.0 04/22/2021 1624   EOSABS 4.2 (H) 04/22/2021 1624   BASOSABS 0.0 04/22/2021 1624    CMP     Component Value Date/Time   NA 137 04/24/2021 0450   NA 142 11/22/2019 0000   K 4.0 04/24/2021 0450   CL 107 04/24/2021 0450   CO2 24 04/24/2021 0450   GLUCOSE 132 (H) 04/24/2021 0450   BUN 20 04/24/2021 0450   BUN 22 (A) 11/22/2019 0000   CREATININE 1.07 04/24/2021 0450   CREATININE 1.63 (H) 12/01/2018 1032   CALCIUM 8.1 (L) 04/24/2021 0450   PROT 5.7 (L) 04/24/2021 0450   ALBUMIN 2.4 (L) 04/24/2021 0450   AST 34 04/24/2021 0450   ALT 22 04/24/2021 0450   ALKPHOS 58 04/24/2021 0450   BILITOT 0.6 04/24/2021 0450   GFRNONAA >60 04/24/2021 0450   GFRNONAA 36 (L) 12/01/2018 1032   GFRAA >60 12/07/2019 0558   GFRAA 41 (L) 12/01/2018 1032   Overnight LTM EEG with no evidence of seizures.  Continuous slow generalized.  CSF studies in the emergency room not consistent with a CNS infection.  Imaging I have reviewed images in epic and the results pertinent to this consultation are: MRI brain without contrast with no evidence of acute abnormality.  Moderate atrophy and chronic microvascular ischemic disease that has progressed from 2 years ago.  Remote left frontal infarct.  Assessment: 87 year old, past medical history of dementia with behavioral disturbance, depression, hyperlipidemia, hyperglycemia, prior history of stroke with residual right-sided weakness, brought in with seizure-like activity noted at the doctor's office Test  right arm shaking and facial twitching.  Received multiple doses of benzodiazepines and had to be intubated for airway protection. EEG and long-term EEG overnight negative for ongoing seizure activity.  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  LP was done due to concern for rash on the skin and now new onset seizures-CSF analysis not very likely for meningitis/encephalitis.  Urinalysis with moderate leuk esterase, 11-20 WBCs-defer management to primary team.  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  Dosage might need to be adjusted as outpatient given advanced age.  If remains somnolent, consider doing Keppra 250 twice daily instead of 500 twice daily. Continue to minimize sedation and extubate when able to Continue acyclovir till the HSV PCR comes  negative.  No MRI supportive diagnosis of HSV encephalitis/no temporal lobe involvement. Plan discussed with Dr. Erin Fulling  -- Amie Portland, MD Neurologist [facility] Pager: [phone]  CRITICAL CARE ATTESTATION Performed by: Amie Portland, MD Total critical care time: 30 minutes Critical care time was exclusive of separately billable procedures and treating other patients and/or supervising APPs/Residents/Students Critical care was necessary to treat or prevent imminent or life-threatening deterioration due to seizure, status epilepticus  This patient is critically ill and at significant risk for neurological worsening and/or death and care requires constant monitoring. Critical care was time spent personally by me on the following activities: development of treatment plan with patient and/or surrogate as well as nursing, discussions with consultants, evaluation of patient's  response to treatment, examination of patient, obtaining history from patient or surrogate, ordering and performing treatments and interventions, ordering and review of laboratory studies, ordering and review of radiographic studies, pulse oximetry, re-evaluation of patient's condition, participation in multidisciplinary rounds and medical decision making of high complexity in the care of this patient.

## 2021-04-24 NOTE — Progress Notes (Signed)
SLP Cancellation Note  Patient Details Name: [name] MRN: [id number] DOB: [birth year]   Cancelled treatment:       Reason Eval/Treat Not Completed: Fatigue/lethargy limiting ability to participate (Case discussed with Vaughan Basta, RN who indicated that the pt is not very responsive at this time and will likely not participate. SLP will follow up on subsequent date.)  Aava Deland I. Hardin Negus, [place], [place] Office number [phone] Pager [place] 04/24/2021, 5:09 PM

## 2021-04-24 NOTE — Procedures (Signed)
Extubation Procedure Note  Patient Details:   Name: [name] DOB: 10-Dec-1921 MRN: [id number]   Airway Documentation:    Vent end date: 04/24/21 Vent end time: 1104   Evaluation  O2 sats: stable throughout Complications: No apparent complications Patient did tolerate procedure well. Bilateral Breath Sounds: Rhonchi   No Placed on 4 L min [place] Unable to perform Incentive spirometer due to mental status  Revonda Standard 04/24/2021, 11:06 AM

## 2021-04-24 NOTE — Progress Notes (Signed)
Palliative:  Consult received and chart reviewed. Visited patient at bedside and received update from nurse. Patient extubated earlier today - stable. Patient nonverbal.  No family at bedside. Briefly spoke with daughter via phone; she prefers to discuss goals of care in person and will be at bedside most of the day tomorrow - will plan to meet with her 9/24.  On chart review, PMT met with patient's daughter in 2021 - at that time, per chart, patient was bedbound and nonverbal. Family had requested full code and full scope care.   Juel Burrow, DNP, AGNP-C Palliative Medicine Team Team Phone # [phone]  Pager # [phone]  NO CHARGE

## 2021-04-24 NOTE — Progress Notes (Signed)
eLink Physician-Brief Progress Note Patient Name: Romelo Sciandra DOB: May 02, 1922 MRN: [id number]   Date of Service  04/24/2021  HPI/Events of Note  Hypoglycemia - Blood glucose = 73.   eICU Interventions  Plan: D10W IV infusion to run at 30 mL/hour.     Intervention Category Major Interventions: Other:  Lysle Dingwall 04/24/2021, 7:57 PM

## 2021-04-25 ENCOUNTER — Inpatient Hospital Stay (HOSPITAL_COMMUNITY): Payer: Medicare HMO

## 2021-04-25 DIAGNOSIS — R609 Edema, unspecified: Secondary | ICD-10-CM

## 2021-04-25 DIAGNOSIS — G40901 Epilepsy, unspecified, not intractable, with status epilepticus: Secondary | ICD-10-CM | POA: Diagnosis not present

## 2021-04-25 LAB — GLUCOSE, CAPILLARY
Glucose-Capillary: 99 mg/dL (ref 70–99)
Glucose-Capillary: 110 mg/dL — ABNORMAL HIGH (ref 70–99)
Glucose-Capillary: 109 mg/dL — ABNORMAL HIGH (ref 70–99)
Glucose-Capillary: 106 mg/dL — ABNORMAL HIGH (ref 70–99)
Glucose-Capillary: 130 mg/dL — ABNORMAL HIGH (ref 70–99)

## 2021-04-25 LAB — COMPREHENSIVE METABOLIC PANEL
ALT: 23 U/L (ref 0–44)
AST: 33 U/L (ref 15–41)
Albumin: 2.5 g/dL — ABNORMAL LOW (ref 3.5–5.0)
Alkaline Phosphatase: 57 U/L (ref 38–126)
Anion gap: 6 (ref 5–15)
BUN: 15 mg/dL (ref 8–23)
CO2: 24 mmol/L (ref 22–32)
Calcium: 8.4 mg/dL — ABNORMAL LOW (ref 8.9–10.3)
Chloride: 110 mmol/L (ref 98–111)
Creatinine, Ser: 0.98 mg/dL (ref 0.61–1.24)
GFR, Estimated: >60 mL/min (ref >60)
Glucose, Bld: 112 mg/dL — ABNORMAL HIGH (ref 70–99)
Potassium: 3.5 mmol/L (ref 3.5–5.1)
Sodium: 140 mmol/L (ref 135–145)
Total Bilirubin: 0.3 mg/dL (ref 0.3–1.2)
Total Protein: 6.2 g/dL — ABNORMAL LOW (ref 6.5–8.1)

## 2021-04-25 LAB — CBC
HCT: 25.9 % — ABNORMAL LOW (ref 39.0–52.0)
Hemoglobin: 8 g/dL — ABNORMAL LOW (ref 13.0–17.0)
MCH: 29.3 pg (ref 26.0–34.0)
MCHC: 30.9 g/dL (ref 30.0–36.0)
MCV: 94.9 fL (ref 80.0–100.0)
Platelets: 272 10*3/uL (ref 150–400)
RBC: 2.73 MIL/uL — ABNORMAL LOW (ref 4.22–5.81)
RDW: 14.9 % (ref 11.5–15.5)
WBC: 12.1 10*3/uL — ABNORMAL HIGH (ref 4.0–10.5)
nRBC: 0 % (ref 0.0–0.2)

## 2021-04-25 LAB — HSV 1/2 PCR, CSF
HSV-1 DNA: NEGATIVE
HSV-2 DNA: NEGATIVE

## 2021-04-25 LAB — PHOSPHORUS: Phosphorus: 2.7 mg/dL (ref 2.5–4.6)

## 2021-04-25 LAB — MAGNESIUM: Magnesium: 2 mg/dL (ref 1.7–2.4)

## 2021-04-25 MED ORDER — AMPICILLIN-SULBACTAM SODIUM 1.5 (1-0.5) G IJ SOLR
1.5000 g | Freq: Three times a day (TID) | INTRAMUSCULAR | Status: AC
  Administered 2021-04-25 – 2021-04-30 (×16): 1.5 g via INTRAVENOUS
  Filled 2021-04-25 (×20): qty 4

## 2021-04-25 MED ORDER — METOPROLOL TARTRATE 5 MG/5ML IV SOLN
2.5000 mg | Freq: Four times a day (QID) | INTRAVENOUS | Status: DC
  Administered 2021-04-25 – 2021-04-30 (×19): 2.5 mg via INTRAVENOUS
  Filled 2021-04-25 (×19): qty 5

## 2021-04-25 MED ORDER — SODIUM CHLORIDE 0.9 % IV SOLN
250.0000 mg | Freq: Two times a day (BID) | INTRAVENOUS | Status: DC
  Administered 2021-04-25 – 2021-04-29 (×9): 250 mg via INTRAVENOUS
  Filled 2021-04-25 (×12): qty 2.5

## 2021-04-25 NOTE — Progress Notes (Signed)
Left upper extremity venous duplex completed. Refer to "CV Proc" under chart review to view preliminary results.  04/25/2021 3:56 PM Kelby Aline., MHA, RVT, RDCS, RDMS

## 2021-04-25 NOTE — Progress Notes (Signed)
Neurology Progress Note   S:// No acute events overnight Extubated yesterday. Not following commands    O:// Current vital signs: BP (!) 158/89   Pulse (!) 109   Temp 98.4 F (36.9 C) (Axillary)   Resp (!) 24   Ht 6' (1.829 m)   Wt 69.4 kg   SpO2 100%   BMI 20.75 kg/m  Vital signs in last 24 hours: Temp:  [97.8 F (36.6 C)-99.3 F (37.4 C)] 98.4 F (36.9 C) (09/24 0747) Pulse Rate:  [87-111] 109 (09/24 0900) Resp:  [17-27] 24 (09/24 0900) BP: (94-158)/(51-89) 158/89 (09/24 0900) SpO2:  [96 %-100 %] 100 % (09/24 0900) Weight:  [69.4 kg] 69.4 kg (09/24 0500) General: Extubated since yesterday.  Comfortably sleeping in bed. HEENT: Normocephalic/atraumatic CVs: Regular rhythm Respiratory: Breathing well and saturating normally on room air Abdomen nondistended nontender Extremities warm well perfused Neurological exam He is sleeping in bed Keeps his eyes closed-does not open to voice Upon trying to examine his eyes he strongly keeps them closed. Does not participate in the exam Nonverbal Cannot assess his pupils or eye movements or visual fields. Face appears grossly symmetric. There is no spontaneous movement in any of the 4 extremities. No grimace to noxious stimulation No withdrawal to noxious stimulation Continues to have mildly increased tone in the upper extremities bilaterally    Medications  Current Facility-Administered Medications:    albuterol (PROVENTIL) (2.5 MG/3ML) 0.083% nebulizer solution 2.5 mg, 2.5 mg, Nebulization, Q2H PRN, [place] Crane M, PA-C, 2.5 mg at 04/25/21 0802   bacitracin ointment 1 application, 1 application, Topical, BID, Agarwala, Ravi, MD, 1 application at 38/10/17 1054   budesonide (PULMICORT) nebulizer solution 0.25 mg, 0.25 mg, Nebulization, BID, Ayesha Rumpf, Stephanie M, PA-C, 0.25 mg at 04/25/21 0759   chlorhexidine gluconate (MEDLINE KIT) (PERIDEX) 0.12 % solution 15 mL, 15 mL, Mouth Rinse, BID, Renee Pain, MD, 15 mL at  04/25/21 5102   Chlorhexidine Gluconate Cloth 2 % PADS 6 each, 6 each, Topical, Daily, Renee Pain, MD, 6 each at 04/25/21 1055   clobetasol cream (TEMOVATE) 5.85 % 1 application, 1 application, Topical, BID, Agarwala, Ravi, MD, 1 application at 27/78/24 1055   dextrose 10 % infusion, , Intravenous, Continuous, Anders Simmonds, MD, Last Rate: 30 mL/hr at 04/25/21 0900, Infusion Verify at 04/25/21 0900   famotidine (PEPCID) IVPB 20 mg in NS 100 mL IVPB, 20 mg, Intravenous, Q24H, Reese, Stephanie M, PA-C, Stopped at 04/25/21 0041   feeding supplement (VITAL AF 1.2 CAL) liquid 1,000 mL, 1,000 mL, Per Tube, Continuous, Agarwala, Ravi, MD, Last Rate: 40 mL/hr at 04/24/21 0300, Rate Change at 04/24/21 0300   heparin injection 5,000 Units, 5,000 Units, Subcutaneous, Q8H, Reese, Stephanie M, PA-C, 5,000 Units at 04/25/21 2353   insulin aspart (novoLOG) injection 0-9 Units, 0-9 Units, Subcutaneous, Q4H, Reese, Stephanie M, PA-C, 1 Units at 04/24/21 1652   ipratropium-albuterol (DUONEB) 0.5-2.5 (3) MG/3ML nebulizer solution 3 mL, 3 mL, Nebulization, BID, Dewald, Cheryle Horsfall, MD   levETIRAcetam (KEPPRA) IVPB 500 mg/100 mL premix, 500 mg, Intravenous, Q12H, Renee Pain, MD, Last Rate: 400 mL/hr at 04/25/21 1053, 500 mg at 04/25/21 1053   lidocaine HCl (PF) (XYLOCAINE) 2 % injection 0-20 mL, 0-20 mL, Intradermal, Once PRN, Agarwala, Ravi, MD   polyethylene glycol (MIRALAX / GLYCOLAX) packet 17 g, 17 g, Per Tube, Daily PRN, Lestine Mount, PA-C Labs CBC    Component Value Date/Time   WBC 12.1 (H) 04/25/2021 0635   RBC 2.73 (L) 04/25/2021 6144  HGB 8.0 (L) 04/25/2021 0635   HCT 25.9 (L) 04/25/2021 0635   HCT 33.2 (L) 06/01/2019 2202   PLT 272 04/25/2021 0635   MCV 94.9 04/25/2021 0635   MCH 29.3 04/25/2021 0635   MCHC 30.9 04/25/2021 0635   RDW 14.9 04/25/2021 0635   LYMPHSABS 1.8 04/22/2021 1624   MONOABS 1.0 04/22/2021 1624   EOSABS 4.2 (H) 04/22/2021 1624   BASOSABS 0.0 04/22/2021  1624    CMP     Component Value Date/Time   NA 140 04/25/2021 0635   NA 142 11/22/2019 0000   K 3.5 04/25/2021 0635   CL 110 04/25/2021 0635   CO2 24 04/25/2021 0635   GLUCOSE 112 (H) 04/25/2021 0635   BUN 15 04/25/2021 0635   BUN 22 (A) 11/22/2019 0000   CREATININE 0.98 04/25/2021 0635   CREATININE 1.63 (H) 12/01/2018 1032   CALCIUM 8.4 (L) 04/25/2021 0635   PROT 6.2 (L) 04/25/2021 0635   ALBUMIN 2.5 (L) 04/25/2021 0635   AST 33 04/25/2021 0635   ALT 23 04/25/2021 0635   ALKPHOS 57 04/25/2021 0635   BILITOT 0.3 04/25/2021 0635   GFRNONAA >60 04/25/2021 0635   GFRNONAA 36 (L) 12/01/2018 1032   GFRAA >60 12/07/2019 0558   GFRAA 41 (L) 12/01/2018 1032   HSV PCR negative  Imaging I have reviewed images in epic and the results pertinent to this consultation are: MRI brain without contrast with no evidence of acute abnormality.  Moderate atrophy and chronic microvascular ischemic disease that has progressed from 2 years ago.  Remote left frontal infarct.  Assessment: 87 year old, past medical history of dementia with behavioral disturbance, depression, hyperlipidemia, hyperglycemia, prior history of stroke with residual right-sided weakness, brought in with seizure-like activity noted at the doctor's office Test right arm shaking and facial twitching.  Received multiple doses of benzodiazepines and had to be intubated for airway protection. EEG and long-term EEG overnight negative for ongoing seizure activity.  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  LP was done due to concern for rash on the skin and now new onset seizures-CSF analysis not very likely for meningitis/encephalitis.  Urinalysis with moderate leuk esterase, 11-20 WBCs-defer management to primary team.  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 Very somnolent.  Would decrease dosage to 250 twice daily of Keppra. Negative HSV PCR-discontinue acyclovir. Plan discussed with Dr. Posey Pronto  -- Amie Portland, MD Neurologist [facility] Pager: [phone]

## 2021-04-25 NOTE — Progress Notes (Signed)
OT Cancellation Note  Patient Details Name: Carzell Saldivar MRN: [id number] DOB: [birth year]   Cancelled Treatment:    Reason Eval/Treat Not Completed: Patient's level of consciousness.  Patient not responding to commands, chart suggests total care at home/bed level.  Family/daughter to stop by the hospital, OT can check in once family arrives to determine need for skilled services.    Vishruth Seoane D Kiira Brach 04/25/2021, 9:14 AM

## 2021-04-25 NOTE — Progress Notes (Signed)
SLP Cancellation Note  Patient Details Name: [name] MRN: [id number] DOB: Mar 09, 1922   Cancelled treatment:       Reason Eval/Treat Not Completed: Fatigue/lethargy limiting ability to participate-D/W RN, not alert enough to participate in swallow assessment. SLP will follow for readiness.   Estefani Bateson L. Tivis Ringer, [place] CCC/SLP Acute Rehabilitation Services Office number [phone] Pager [phone]    Juan Quam Laurice 04/25/2021, 9:40 AM

## 2021-04-25 NOTE — Progress Notes (Signed)
[facility] Progress Note  Patient: [name]    QJJ:[id number]  DOA: 04/22/2021     Date of Service: the patient was seen and examined on 04/25/2021  Brief hospital course: Past medical history of CAD with PCI, HTN, HLD, dementia, CVA, type II DM, stage IV pressure ulcer.  Presents with complaints of seizure and status epilepticus.  Required intubation for airway protection on presentation.  Currently plan is monitor for improvement in mentation.  Subjective: Nonverbal which appears to be his baseline.  At his baseline the patient remains bedridden and hence has stage IV pressure ulcer.  No acute events overnight.  Assessment and Plan: 1.  Status epilepticus. Acute metabolic encephalopathy. History of dementia  History of stroke with right-sided weakness Chronically bedridden. Patient had a seizure while at wound care clinic. Prior history of seizures were also seen. Neurology was consulted on admission as the patient presented with status epilepticus. Started on IV Keppra. Currently on 400 mg twice daily.  Tension due to ongoing somnolence Keppra dose reduced to 50 mg twice daily for now. LP cultures negative. Was on acyclovir currently discontinued as HSV PCR is negative. No evidence of infection.  2.  Dysphagia Unable to work with speech therapy right now. Recommends NPO. Discontinue oral oral medication.  Awaiting goals of care conversation with the family.  3.  Acute respiratory failure Possible pneumonia History of COPD Required intubation at the time of admission for airway protection. Chest x-ray shows evidence of bilateral airspace opacities which appears to be improved in comparison to the prior x-ray. Will initiate Unasyn and continue antibiotics for 5 days.  Last day 9/29.  4.  Anemia normocytic Hemoglobin remained stable. The patient has no evidence of acute bleeding.  Monitor.  5.  Stage IV pressure ulcer sacrum, present on admission. Rash Continue  foam dressing and air mattress.  Wound care consulted as well. Biopsy was performed for a skin bulla.  Pathology currently pending.  6.  Hypertension, HLD, CAD Prior history of PCI in 2014. Aspirin was initiated.  Currently patient is unable to take anything p.o.  Holding. Will monitor. Blood pressure remains elevated. Will treat with IV Lopressor with holding parameters.  7.  Moderate malnutrition. Poor p.o. intake in the setting of dysphagia. Regular score further worsening. Will be avoiding further clarity on goals of care.  If unable to reach family tomorrow would have to decide on core track on Monday   Body mass index is 20.75 kg/m.  Nutrition Problem: Moderate Malnutrition Etiology: chronic illness (dementia)   DVT Prophylaxis:   heparin injection 5,000 Units Start: 04/23/21 0600 SCDs Start: 04/22/21 2204   Pt is Full code.  Communication: no family was present at bedside, at the time of interview.   Data Reviewed: I have personally reviewed and interpreted daily labs, tele strips, imaging. WBC elevated, hb stable.   Physical Exam:  General: Appear in mild distress, no Rash; Oral Mucosa Clear, moist. no Abnormal Neck Mass Or lumps, Conjunctiva normal  Cardiovascular: S1 and S2 Present, no Murmur, Respiratory: increased respiratory effort, Bilateral Air entry present and bilateral  Crackles, no wheezes Abdomen: Bowel Sound present, Soft and no tenderness Extremities: trace Pedal edema Neurology: alert and nonverbal and not oriented to time, place, and person Gait not checked due to patient safety concerns  Vitals:   04/25/21 1500 04/25/21 1512 04/25/21 1600 04/25/21 1700  BP: 135/69  (!) 145/72 137/67  Pulse: 88  89 89  Resp: (!) 21  19 (!) 23  Temp:  98.2 F (36.8 C)    TempSrc:  Axillary    SpO2: 100%  100% 100%  Weight:      Height:       Disposition:  Status is: Inpatient  Remains inpatient appropriate because:IV treatments appropriate due to  intensity of illness or inability to take PO and Inpatient level of care appropriate due to severity of illness  Dispo: The patient is from: Home              Anticipated d/c is to: SNF              Patient currently is not medically stable to d/c.   Difficult to place patient No  Time spent: 35 minutes. I reviewed all nursing notes, pharmacy notes, vitals, pertinent old records. I have discussed plan of care as described above with RN.  Author: Berle Mull, MD [facility] 04/25/2021 6:09 PM  To reach On-call, see care teams to locate the attending and reach out via [email or web address]. Between 7PM-7AM, please contact night-coverage If you still have difficulty reaching the attending provider, please page the [facility] (Director on Call) for [facility] on amion for assistance.

## 2021-04-25 NOTE — Progress Notes (Signed)
Pharmacy Antibiotic Note  [name] is a 87 y.o. male admitted on 04/22/2021 with  aspiration PNA .  Pharmacy has been consulted for unasyn dosing.  WBC 12.1, afebrile. Scr 0.98 (CrCl 42 mL/min). CXR showing scattered bilateral airspace opacities and small R pleural effusion.   Plan: Unasyn 1.5 g IV every 8 hours Monitor renal fx, cx results, clinical pic, and LOT   Height: 6' (182.9 cm) Weight: 69.4 kg (153 lb) IBW/kg (Calculated) : 77.6  Temp (24hrs), Avg:98.1 F (36.7 C), Min:97.8 F (36.6 C), Max:98.4 F (36.9 C)  Recent Labs  Lab 04/22/21 1624 04/22/21 1628 04/22/21 1824 04/23/21 0620 04/23/21 1006 04/24/21 0450 04/25/21 0635  WBC 11.3*  --   --  12.7*  --  9.9 12.1*  CREATININE 1.13 1.10  --  1.08  --  1.07 0.98  LATICACIDVEN 3.7*  --  2.3*  --  3.0*  --   --     Estimated Creatinine Clearance: 42.3 mL/min (by C-G formula based on SCr of 0.98 mg/dL).    Allergies  Allergen Reactions   Lisinopril Cough    Antimicrobials this admission: Acyclovir 9/21 >> 9/23 Unasyn 9/24 >>   Dose adjustments this admission: N/A  Microbiology results: 9/21 CSF Cx: ngtd 9/21 BCx: ngtd  9/22 MRSA PCR: neg   Thank you for allowing pharmacy to be a part of this patient's care.  Antonietta Jewel, PharmD, [place] Clinical Pharmacist  Phone: [phone] 04/25/2021 6:58 PM  Please check AMION for all [place] phone numbers After 10:00 PM, call [place] [phone]

## 2021-04-25 NOTE — Progress Notes (Signed)
Palliative:  Per brief conversation with daughter yesterday she told me she would be available anytime today to meet; unfortunately I have been unable to connect with her at bedside. Attempted to call her at number I reached her at yesterday - no answer - voice mail with call back number listed.   Patient remains nonverbal, not following commands. Per discussion with RN, plans to move out of ICU today. Respiratory status and vitals are stable.  On chart review, PMT met with patient's daughter in 2021 - at that time, per chart, patient was bedbound and nonverbal. Family had requested full code and full scope care.   Juel Burrow, DNP, AGNP-C Palliative Medicine Team Team Phone # [phone]  Pager # [phone]  NO CHARGE

## 2021-04-25 NOTE — Progress Notes (Signed)
Physical Therapy Evaluation Patient Details Name: [name] MRN: [id number] DOB: 09/14/1921 Today's Date: 04/25/2021  History of Present Illness  This 87 y.o. African American male  presented to the [facility] Emergency Department via EMS on 9/22 with complaints of new onset seizure disorder.  The patient was intubated in the ER for airway protection. Extubated on 9/23. PMH: CAD with DES 2014, HTN, HLD, Dementia, Stroke, DMII, stage 4 PI, chronic sacral ulcer getting wound care  Clinical Impression  Pt admitted with above diagnosis. Pt was unable to follow any commands today. Pt would not even make eye contact with PT.  Called family to ask what pt was doing at home and family has been using a lift for some time with pt.  Daughter states that pt never got the PT he should have and requests that PT try to see pt while here.  Explained to daughter that it may not be helpful but will return to assess pt with family present on Monday after 9 am to determine if pt is appropriate for PT.  Trial of PT planned.   Pt currently with functional limitations due to the deficits listed below (see PT Problem List). Pt will benefit from skilled PT to increase their independence and safety with mobility to allow discharge to the venue listed below.          Recommendations for follow up therapy are one component of a multi-disciplinary discharge planning process, led by the attending physician.  Recommendations may be updated based on patient status, additional functional criteria and insurance authorization.  Follow Up Recommendations Other (comment) (TBA based on pts cooperativeness)    Equipment Recommendations  None recommended by PT    Recommendations for Other Services       Precautions / Restrictions Precautions Precautions: Fall Restrictions Weight Bearing Restrictions: No      Mobility  Bed Mobility Overal bed mobility: Needs Assistance Bed Mobility: Supine to Sit      Supine to sit: Total assist     General bed mobility comments: Pt not assisting therapy at present.  Would not follow commands and appeared to look at PT once when talking to pt.    Transfers                    Ambulation/Gait                Stairs            Wheelchair Mobility    Modified Rankin (Stroke Patients Only)       Balance                                             Pertinent Vitals/Pain Pain Assessment: No/denies pain    Home Living Family/patient expects to be discharged to:: Private residence Living Arrangements: Children Available Help at Discharge: Family;Available 24 hours/day Type of Home: House Home Access: Stairs to enter   [employer] of Steps: 1 Home Layout: One level Home Equipment: Walker - 2 wheels;Cane - single point;Wheelchair - manual;Hospital bed (hoyer lift)      Prior Function Level of Independence: Needs assistance   Gait / Transfers Assistance Needed: Called daughter [name] and she confirms that pt was mostly bedbound after CVA 2020. she states that PT came a few times and pt wasnt cooperative and pt never got  the therapy he should have, they hoyer lift him OOB.  ADL's / Homemaking Assistance Needed: total assist for all aspects of B/D and feeding per daughter        Hand Dominance   Dominant Hand: Right    Extremity/Trunk Assessment   Upper Extremity Assessment Upper Extremity Assessment: RUE deficits/detail RUE Deficits / Details: no active movement noted    Lower Extremity Assessment Lower Extremity Assessment: RLE deficits/detail;LLE deficits/detail RLE Deficits / Details: pt would not follow commands. Daughter states pt will move to command at home LLE Deficits / Details: pt would not follow commands. Daughter states pt will move to command at home       Communication   Communication: HOH (No verbalization today)  Cognition Arousal/Alertness: Lethargic Behavior  During Therapy: Flat affect Overall Cognitive Status: History of cognitive impairments - at baseline                                        General Comments General comments (skin integrity, edema, etc.): VSS    Exercises General Exercises - Upper Extremity Shoulder Flexion: PROM;Both;Supine Elbow Flexion: PROM;Both;5 reps;Supine General Exercises - Lower Extremity Heel Slides: PROM;Both;5 reps;Supine   Assessment/Plan    PT Assessment Patient needs continued PT services  PT Problem List Decreased activity tolerance;Decreased balance;Decreased mobility;Decreased knowledge of use of DME;Decreased safety awareness;Decreased knowledge of precautions       PT Treatment Interventions Functional mobility training;Therapeutic activities;Therapeutic exercise;Balance training;Patient/family education    PT Goals (Current goals can be found in the Care Plan section)  Acute Rehab PT Goals Patient Stated Goal: unable to state by pt, daughter requests PT try to see him while here PT Goal Formulation: Patient unable to participate in goal setting Time For Goal Achievement: 05/08/21 Potential to Achieve Goals: Fair    Frequency Min 2X/week (trial)   Barriers to discharge        Co-evaluation               AM-PAC PT "6 Clicks" Mobility  Outcome Measure Help needed turning from your back to your side while in a flat bed without using bedrails?: Total Help needed moving from lying on your back to sitting on the side of a flat bed without using bedrails?: Total Help needed moving to and from a bed to a chair (including a wheelchair)?: Total Help needed standing up from a chair using your arms (e.g., wheelchair or bedside chair)?: Total Help needed to walk in hospital room?: Total Help needed climbing 3-5 steps with a railing? : Total 6 Click Score: 6    End of Session   Activity Tolerance: Patient limited by lethargy Patient left: in bed;with call bell/phone  within reach;with bed alarm set Nurse Communication: Mobility status;Need for lift equipment PT Visit Diagnosis: Muscle weakness (generalized) (M62.81)    Time: 1610-9604 PT Time Calculation (min) (ACUTE ONLY): 20 min   Charges:   PT Evaluation $PT Eval Moderate Complexity: 1 Mod          Trystan Eads M,PT Acute Rehab Services [phone] (pager)   Alvira Philips 04/25/2021, 9:35 AM

## 2021-04-26 DIAGNOSIS — E440 Moderate protein-calorie malnutrition: Secondary | ICD-10-CM | POA: Diagnosis not present

## 2021-04-26 DIAGNOSIS — G40901 Epilepsy, unspecified, not intractable, with status epilepticus: Secondary | ICD-10-CM | POA: Diagnosis not present

## 2021-04-26 DIAGNOSIS — R238 Other skin changes: Secondary | ICD-10-CM | POA: Diagnosis not present

## 2021-04-26 DIAGNOSIS — J9600 Acute respiratory failure, unspecified whether with hypoxia or hypercapnia: Secondary | ICD-10-CM | POA: Diagnosis not present

## 2021-04-26 DIAGNOSIS — Z7189 Other specified counseling: Secondary | ICD-10-CM

## 2021-04-26 LAB — GLUCOSE, CAPILLARY
Glucose-Capillary: 110 mg/dL — ABNORMAL HIGH (ref 70–99)
Glucose-Capillary: 113 mg/dL — ABNORMAL HIGH (ref 70–99)
Glucose-Capillary: 116 mg/dL — ABNORMAL HIGH (ref 70–99)
Glucose-Capillary: 121 mg/dL — ABNORMAL HIGH (ref 70–99)
Glucose-Capillary: 121 mg/dL — ABNORMAL HIGH (ref 70–99)
Glucose-Capillary: 129 mg/dL — ABNORMAL HIGH (ref 70–99)

## 2021-04-26 LAB — CBC WITH DIFFERENTIAL/PLATELET
Abs Immature Granulocytes: 0.03 10*3/uL (ref 0.00–0.07)
Basophils Absolute: 0 10*3/uL (ref 0.0–0.1)
Basophils Relative: 0 %
Eosinophils Absolute: 1.6 10*3/uL — ABNORMAL HIGH (ref 0.0–0.5)
Eosinophils Relative: 19 %
HCT: 24.2 % — ABNORMAL LOW (ref 39.0–52.0)
Hemoglobin: 7.5 g/dL — ABNORMAL LOW (ref 13.0–17.0)
Immature Granulocytes: 0 %
Lymphocytes Relative: 18 %
Lymphs Abs: 1.5 10*3/uL (ref 0.7–4.0)
MCH: 29.1 pg (ref 26.0–34.0)
MCHC: 31 g/dL (ref 30.0–36.0)
MCV: 93.8 fL (ref 80.0–100.0)
Monocytes Absolute: 0.7 10*3/uL (ref 0.1–1.0)
Monocytes Relative: 9 %
Neutro Abs: 4.6 10*3/uL (ref 1.7–7.7)
Neutrophils Relative %: 54 %
Platelets: 249 10*3/uL (ref 150–400)
RBC: 2.58 MIL/uL — ABNORMAL LOW (ref 4.22–5.81)
RDW: 14.5 % (ref 11.5–15.5)
WBC: 8.5 10*3/uL (ref 4.0–10.5)
nRBC: 0 % (ref 0.0–0.2)

## 2021-04-26 LAB — RENAL FUNCTION PANEL
Albumin: 2.3 g/dL — ABNORMAL LOW (ref 3.5–5.0)
Anion gap: 7 (ref 5–15)
BUN: 12 mg/dL (ref 8–23)
CO2: 23 mmol/L (ref 22–32)
Calcium: 8.5 mg/dL — ABNORMAL LOW (ref 8.9–10.3)
Chloride: 109 mmol/L (ref 98–111)
Creatinine, Ser: 1.02 mg/dL (ref 0.61–1.24)
GFR, Estimated: >60 mL/min (ref >60)
Glucose, Bld: 134 mg/dL — ABNORMAL HIGH (ref 70–99)
Phosphorus: 2.6 mg/dL (ref 2.5–4.6)
Potassium: 3.4 mmol/L — ABNORMAL LOW (ref 3.5–5.1)
Sodium: 139 mmol/L (ref 135–145)

## 2021-04-26 LAB — CSF CULTURE W GRAM STAIN: Culture: NO GROWTH

## 2021-04-26 LAB — MAGNESIUM: Magnesium: 1.9 mg/dL (ref 1.7–2.4)

## 2021-04-26 NOTE — Consult Note (Signed)
Consultation Note Date: 04/26/2021   Patient Name: [name]  DOB: [birth year]  MRN: [id number]  Age / Sex: 87 y.o., male  PCP: Patient, No Pcp Per (Inactive) Referring Physician: Lavina Hamman, MD  Reason for Consultation: Establishing goals of care  HPI/Patient Profile: 87 y.o. male  with past medical history of dementia with behavioral disturbance, depression, hyperlipidemia, hyperglycemia, prior history of stroke with residual right-sided weakness, wheelchair-bound at baseline requiring 24/7 help with ADLs, CAD s/p PCI, stage 4 pressure ulcer, admitted on 04/22/2021 with new onset seizures.   Patient is nonverbal and bedbound at baseline. Palliative medicine has been consulted to assist with goals of care conversation.  Clinical Assessment and Goals of Care:  I have reviewed medical records including EPIC notes, labs and imaging, received report from RN, assessed the patient and then met at the bedside along with his daughter [name] to discuss diagnosis prognosis, GOC, EOL wishes, disposition and options.  I introduced Palliative Medicine as specialized medical care for people living with serious illness. It focuses on providing relief from the symptoms and stress of a serious illness. The goal is to improve quality of life for both the patient and the family.  We discussed a brief life review of the patient and then focused on their current illness. [name] shares that her father [name] lives with her at her home. He is widowed and has 4 living children (two have passed away). He is an Scientist, research (life sciences) and has all of the resources he needs at home through the [place]. [name] is his caregiver and assists with all ADLs 24/7. She reports having all the equipment and support that she needs such as wound care for his sacral ulcer, wheelchair, hoyer lift, hospital bed. He was eating pureed foods and drinking liquids with  thickener before admission. He usually says a few words at a time but does not speak in complete sentences at baseline. His dementia and behaviors were stable and well-managed. [name] tells me that he is able to acknowledge her and said I love you yesterday.   Discussed patient's continued safety concerns with swallow evaluations and concerns for nutrition. Discussed signs and symptoms of end stage dementia, including dysphagia, and possibility of progression with recent stroke and seizures. Several family members are visiting from [place] this weekend and she would like to discuss further before deciding on proceeding with artificial nutrition such as cortrak and PEG tube.    I attempted to elicit values and goals of care important to the patient.   [name] remains committed to patient's stabilization and return home with home health services. She would never want him to return to a nursing home. She has had a bad experience with hospice in the past as well.  The difference between aggressive medical intervention and comfort care was considered in light of the patient's goals of care.   Advanced directives, concepts specific to code status, artifical feeding and hydration, and rehospitalization were considered and discussed.  Hospice and Palliative Care services outpatient were  explained and offered.  Discussed the importance of continued conversation with family and the medical providers regarding overall plan of care and treatment options, ensuring decisions are within the context of the patient's values and GOCs.    Questions and concerns were addressed.  Hard Choices booklet left for review. The family was encouraged to call with questions or concerns.  PMT will continue to support holistically.    HCPOA is patient's daughter [name]. Patient is unable to make decisions for himself.    SUMMARY OF RECOMMENDATIONS   -Full code/full scope treatment -Patient's daughter [name] will discuss  nutrition and NG with family this evening, agrees to have a follow up meeting tomorrow for decision -Ongoing support from PMT  Code Status/Advance Care Planning: Full code  Palliative Prophylaxis:  Aspiration, Eye Care, Frequent Pain Assessment, Oral Care, Palliative Wound Care, and Turn Reposition  Additional Recommendations (Limitations, Scope, Preferences): Full Scope Treatment  Psycho-social/Spiritual:  Desire for further Chaplaincy support:TBD Additional Recommendations: Education on Hospice and Referral to [employer]   Prognosis:  Guarded  Discharge Planning: Home with Home Health      Primary Diagnoses: Present on Admission:  Status epilepticus ([place])  Type 2 diabetes, controlled, with peripheral circulatory disorder (HCC)  Centrilobular emphysema ([place])  Normocytic anemia  Skin bulla  Endotracheally intubated  Abnormal CXR   I have reviewed the medical record, interviewed the patient and family, and examined the patient. The following aspects are pertinent.  Past Medical History:  Diagnosis Date   Arthritis    CAD (coronary artery disease)    s/p DES to LAD 06/2013   Dementia with behavioral disturbance (HCC)    Depression    Dyslipidemia    Glaucoma    Hyperglycemia 04/17/2014   Hyperlipidemia    Hypertension    Stroke ([place])    Type 2 diabetes mellitus ([place])    Social History   Socioeconomic History   Marital status: Widowed    Spouse name: Not on file   Number of children: 4   Years of education: Not on file   Highest education level: Not on file  Occupational History   Occupation: Retired  Tobacco Use   Smoking status: Former    Types: Cigarettes    Quit date: 08/02/1968    Years since quitting: 52.7   Smokeless tobacco: Never  Vaping Use   Vaping Use: Never used  Substance and Sexual Activity   Alcohol use: No    Alcohol/week: 0.0 standard drinks   Drug use: No   Sexual activity: Not Currently  Other Topics Concern   Not on  file  Social History Narrative   Retired Warden/ranger   widowed, lives with dtr and youngest son   5 kids from first marriage   Social Determinants of Health   Financial Resource Strain: Not on file  Food Insecurity: Not on file  Transportation Needs: Not on file  Physical Activity: Not on file  Stress: Not on file  Social Connections: Not on file   Family History  Problem Relation Age of Onset   Arthritis Mother    Breast cancer Mother    Arthritis Father    Dementia Father    Diabetes Other    Arthritis Other    Colon cancer Other    Stroke Other    Diabetes type II Sister    Suicidality Brother    Diabetes type I Brother    Diabetes type I Brother    Diabetes type I Brother  Diabetes type II Sister    Breast cancer Daughter    Emphysema Daughter    Scheduled Meds:  bacitracin  1 application Topical BID   budesonide (PULMICORT) nebulizer solution  0.25 mg Nebulization BID   chlorhexidine gluconate (MEDLINE KIT)  15 mL Mouth Rinse BID   Chlorhexidine Gluconate Cloth  6 each Topical Daily   clobetasol cream  1 application Topical BID   heparin  5,000 Units Subcutaneous Q8H   ipratropium-albuterol  3 mL Nebulization BID   metoprolol tartrate  2.5 mg Intravenous Q6H   Continuous Infusions:  ampicillin-sulbactam (UNASYN) IV Stopped (04/26/21 0524)   dextrose 30 mL/hr at 04/26/21 1100   levETIRAcetam Stopped (04/26/21 1007)   PRN Meds:.albuterol, lidocaine HCl (PF) Medications Prior to Admission:  Prior to Admission medications   Medication Sig Start Date End Date Taking? Authorizing Provider  acetaminophen (TYLENOL) 500 MG tablet Take 500 mg by mouth every 6 (six) hours as needed for moderate pain.   Yes [provider]  albuterol (VENTOLIN HFA) 108 (90 Base) MCG/ACT inhaler Inhale 2 puffs into the lungs every 4 (four) hours as needed for wheezing or shortness of breath. INHALE TWO PUFFS EVERY 4-6 HOURS AS NEEDED FOR DIFFICULTY BREATHING,WHEEZING OR  COUGH 06/13/19  Yes Blanchie Dessert, MD  aspirin EC 81 MG tablet Take 1 tablet (81 mg total) by mouth daily. 06/13/19  Yes Plunkett, Loree Fee, MD  escitalopram (LEXAPRO) 5 MG tablet TAKE 1 TABLET EVERY DAY 11/05/19  Yes Reed, Tiffany L, DO  feeding supplement, ENSURE ENLIVE, (ENSURE ENLIVE) LIQD Take 237 mLs by mouth 2 (two) times daily between meals. 12/07/19  Yes Hosie Poisson, MD  ferrous sulfate 220 (44 Fe) MG/5ML solution Take 220 mg by mouth daily.   Yes [provider]  Loratadine 10 MG CAPS Take 1 capsule (10 mg total) by mouth daily. 02/08/20  Yes Reed, Tiffany L, DO  losartan (COZAAR) 100 MG tablet Take 50 mg by mouth daily.   Yes [provider]  metoprolol tartrate (LOPRESSOR) 50 MG tablet Take 25 mg by mouth 2 (two) times daily.   Yes [provider]  Multiple Vitamins-Minerals (SENTRY SENIOR) TABS Take 1 tablet by mouth daily. 06/13/19  Yes Plunkett, Loree Fee, MD  NON FORMULARY Take 1 Dose by mouth 2 (two) times daily. Mix 1 pack with 16 ounce of water.- wound care powder- not sure of name   Yes [provider]  Accu-Chek Softclix Lancets lancets Use to check blood sugar three times daily. Dx: E11.51 12/28/19   Reed, Tiffany L, DO  Alcohol Swabs (SURE-PREP ALCOHOL PREP) 70 % PADS 1 each by Does not apply route as needed. 12/28/19   Reed, Tiffany L, DO  amLODipine (NORVASC) 5 MG tablet Take 1 tablet (5 mg total) by mouth daily. Patient not taking: Reported on 04/22/2021 11/12/19   Hollace Kinnier L, DO  apixaban (ELIQUIS) 5 MG TABS tablet Take 1 tablet (5 mg total) by mouth 2 (two) times daily. Patient not taking: Reported on 04/22/2021 11/12/19   Hollace Kinnier L, DO  Blood Glucose Monitoring Suppl (ACCU-CHEK AVIVA) device Use to test blood sugar three times daily. Dx E11.51 08/21/19   Mariea Clonts, Tiffany L, DO  glucose blood (ACCU-CHEK AVIVA PLUS) test strip Check blood sugar three times daily as directed E11.51 12/28/19   Reed, Tiffany L, DO  hydrochlorothiazide  (HYDRODIURIL) 25 MG tablet TAKE 1 TABLET EVERY DAY Patient not taking: Reported on 04/22/2021 11/05/19   Hollace Kinnier L, DO  Insulin Pen Needle (  B-D UF III MINI PEN NEEDLES) 31G X 5 MM MISC Use to give insulin. 12/28/19   Reed, Tiffany L, DO  NAMZARIC 28-10 MG CP24 TAKE 1 CAPSULE EVERY DAY Patient not taking: Reported on 04/22/2021 11/05/19   Hollace Kinnier L, DO  simvastatin (ZOCOR) 20 MG tablet Take 1 tablet (20 mg total) by mouth daily. Patient not taking: No sig reported 02/08/20   Reed, Tiffany L, DO  tamsulosin (FLOMAX) 0.4 MG CAPS capsule Take 1 capsule (0.4 mg total) by mouth daily after supper. Patient not taking: Reported on 04/22/2021 06/13/19   Blanchie Dessert, MD   Allergies  Allergen Reactions   Lisinopril Cough   Review of Systems  Unable to perform ROS: Patient nonverbal   Physical Exam Vitals and nursing note reviewed.  Constitutional:      General: He is not in acute distress.    Appearance: He is ill-appearing.     Interventions: Nasal cannula in place.  Cardiovascular:     Rate and Rhythm: Normal rate.  Pulmonary:     Effort: Pulmonary effort is normal.  Skin:    General: Skin is warm and dry.  Neurological:     Mental Status: Mental status is at baseline.    Vital Signs: BP 132/63 (BP Location: Right Arm)   Pulse 83   Temp 98.1 F (36.7 C) (Oral)   Resp 17   Ht 6' (1.829 m)   Wt 67.9 kg   SpO2 100%   BMI 20.30 kg/m  Pain Scale: PAINAD    SpO2: SpO2: 100 % O2 Device:SpO2: 100 % O2 Flow Rate: .O2 Flow Rate (L/min): 2 L/min  IO: Intake/output summary:  Intake/Output Summary (Last 24 hours) at 04/26/2021 1143 Last data filed at 04/26/2021 1100 Gross per 24 hour  Intake 1181.09 ml  Output 900 ml  Net 281.09 ml    LBM: Last BM Date: 04/26/21 Baseline Weight: Weight: 79.4 kg Most recent weight: Weight: 67.9 kg     Palliative Assessment/Data: 10%   Flowsheet Rows    Flowsheet Row Most Recent Value  Intake Tab   Referral Department Critical care   Unit at Time of Referral ICU  Palliative Care Primary Diagnosis Neurology  Date Notified 04/24/21  Palliative Care Type New Palliative care  Reason for referral Clarify Goals of Care  Date of Admission 04/22/21  Date first seen by Palliative Care 04/24/21  # of days Palliative referral response time 0 Day(s)  # of days IP prior to Palliative referral 2  Clinical Assessment   Psychosocial & Spiritual Assessment   Palliative Care Outcomes        Time In: 10:00am Time Out: 11:10am Time Total: 70 minutes Greater than 50% of this time was spent in counseling and coordinating care related to the above assessment and plan.  Dorthy Cooler, PA-C Palliative Medicine Team Team phone # [phone]  Thank you for allowing the Palliative Medicine Team to assist in the care of this patient. Please utilize secure chat with additional questions, if there is no response within 30 minutes please call the above phone number.  Palliative Medicine Team providers are available by phone from 7am to 7pm daily and can be reached through the team cell phone.  Should this patient require assistance outside of these hours, please call the patient's attending physician.

## 2021-04-26 NOTE — Evaluation (Signed)
Clinical/Bedside Swallow Evaluation Patient Details  Name: Treyshaun Keatts MRN: [id number] Date of Birth: 1922/07/26  Today's Date: 04/26/2021 Time: SLP Start Time (ACUTE ONLY): 0840 SLP Stop Time (ACUTE ONLY): 1093 SLP Time Calculation (min) (ACUTE ONLY): 25 min  Past Medical History:  Past Medical History:  Diagnosis Date   Arthritis    CAD (coronary artery disease)    s/p DES to LAD 06/2013   Dementia with behavioral disturbance (HCC)    Depression    Dyslipidemia    Glaucoma    Hyperglycemia 04/17/2014   Hyperlipidemia    Hypertension    Stroke ([place])    Type 2 diabetes mellitus ([place])    Past Surgical History:  Past Surgical History:  Procedure Laterality Date   CARDIAC CATHETERIZATION  06/2013   DES to LAD by Dr Tamala Julian   Right Cataract     HPI:  This 87 y.o. African American male  presented to the [facility] Emergency Department via EMS on 9/22 with complaints of new onset seizure disorder.  The patient was intubated in the ER for airway protection. Extubated on 9/23. PMH: CAD with DES 2014, HTN, HLD, Dementia, Stroke, DMII, stage 4 PI, chronic sacral ulcer getting wound care    Assessment / Plan / Recommendation  Clinical Impression  Patient presents with clinicial s/s of oral and pharyngeal phase dyspahgia. He was awake and alert, accepting of boluses throughout assessment. He was not able to form lips around cup or straw and so all boluses were administered via spoon. SLP suspects significant delay in swallow initiation and laryngeal pumping observed with all tested boluses. Immediate throat clearing which led to patient vocalizing as if he had sensation of something in laryngeal vestibule. No cough response was observed and patient unable to perform cued cough. SLP did remove trace amounts of puree solids from oral cavity via oral suction post PO intake. At this time, patient is not safe for PO's and SLP's recommendation is to continue NPO but allow for small  amounts toothette sponges soaked in water after oral care. ST services will continue to follow patient for PO readiness and for developing POC with patient's family and palliative care. SLP Visit Diagnosis: Dysphagia, unspecified (R13.10)    Aspiration Risk  Severe aspiration risk;Risk for inadequate nutrition/hydration    Diet Recommendation NPO   Medication Administration: Via alternative means    Other  Recommendations Oral Care Recommendations: Oral care QID;Staff/trained caregiver to provide oral care    Recommendations for follow up therapy are one component of a multi-disciplinary discharge planning process, led by the attending physician.  Recommendations may be updated based on patient status, additional functional criteria and insurance authorization.  Follow up Recommendations Other (comment) (TBD pending POC)      Frequency and Duration min 2x/week  1 week       Prognosis Prognosis for Safe Diet Advancement: Fair Barriers to Reach Goals: Time post onset;Cognitive deficits Barriers/Prognosis Comment: patient 87 with dementia      Swallow Study   General Date of Onset: 04/22/21 HPI: This 87 y.o. African American male  presented to the [facility] Emergency Department via EMS on 9/22 with complaints of new onset seizure disorder.  The patient was intubated in the ER for airway protection. Extubated on 9/23. PMH: CAD with DES 2014, HTN, HLD, Dementia, Stroke, DMII, stage 4 PI, chronic sacral ulcer getting wound care Type of Study: Bedside Swallow Evaluation Previous Swallow Assessment: during previous hospitalization: BSE 12/2019  recommending Dys 1 thin Diet Prior to this Study: NPO Temperature Spikes Noted: No Respiratory Status: Nasal cannula History of Recent Intubation: Yes Length of Intubations (days): 3 days Date extubated: 04/24/21 Behavior/Cognition: Alert;Requires cueing Oral Cavity Assessment: Within Functional Limits Oral Care Completed  by SLP: Yes Oral Cavity - Dentition: Edentulous Self-Feeding Abilities: Total assist Patient Positioning: Upright in bed Baseline Vocal Quality: Low vocal intensity;Other (comment) (glottal sounding) Volitional Cough: Cognitively unable to elicit Volitional Swallow: Unable to elicit    Oral/Motor/Sensory Function Overall Oral Motor/Sensory Function: Other (comment) (patient able to open mouth and stick tongue out slightly but unable to follow other commands for oral motor exam)   Ice Chips     Thin Liquid Thin Liquid: Impaired Presentation: Cup;Spoon Oral Phase Impairments: Reduced labial seal;Reduced lingual movement/coordination;Poor awareness of bolus Pharyngeal  Phase Impairments: Suspected delayed Swallow;Throat Clearing - Immediate;Other (comments);Wet Vocal Quality Other Comments: throat clearing led to patient vocalizing and seeming to be start of cough response but unable to achieve/demonstrate cough    Nectar Thick Nectar Thick Liquid: Impaired Presentation: Spoon Oral Phase Impairments: Poor awareness of bolus;Reduced lingual movement/coordination Oral phase functional implications: Prolonged oral transit Pharyngeal Phase Impairments: Suspected delayed Swallow;Throat Clearing - Delayed   Honey Thick Honey Thick Liquid: Impaired Oral Phase Impairments: Poor awareness of bolus;Reduced lingual movement/coordination Oral Phase Functional Implications: Prolonged oral transit Pharyngeal Phase Impairments: Suspected delayed Swallow   Puree Puree: Impaired Oral Phase Impairments: Poor awareness of bolus;Reduced lingual movement/coordination Oral Phase Functional Implications: Oral residue;Prolonged oral transit Pharyngeal Phase Impairments: Suspected delayed Swallow   Solid     Solid: Not tested     Sonia Baller, MA, CCC-SLP Speech Therapy

## 2021-04-26 NOTE — Progress Notes (Signed)
Neurology Progress Note   S:// No acute events overnight Extubated 2 days ago More awake, eyes open.   O:// Current vital signs: BP (!) 147/71 (BP Location: Right Arm)   Pulse 81   Temp 98.6 F (37 C) (Axillary)   Resp 20   Ht 6' (1.829 m)   Wt 67.9 kg   SpO2 100%   BMI 20.30 kg/m  Vital signs in last 24 hours: Temp:  [97.7 F (36.5 C)-98.6 F (37 C)] 98.6 F (37 C) (09/25 0707) Pulse Rate:  [75-109] 81 (09/25 0800) Resp:  [17-31] 20 (09/25 0800) BP: (113-161)/(60-89) 147/71 (09/25 0800) SpO2:  [99 %-100 %] 100 % (09/25 0800) Weight:  [67.9 kg] 67.9 kg (09/25 0500) General: Comfortably laying in bed HEENT: Normocephalic/atraumatic Cardiovascular: Regular rhythm Respiratory: Breathing well and saturating normally on room air Neurological exam He is sleeping in bed Opens eyes to voice When I asked him his name, he mumbled incomprehensible words. He was able to track me but did not follow my commands His pupils are equal round reactive to light Extraocular movements appear unhindered Face is symmetric He would not move any of his extremities to command or to noxious stimulation but did wince to pain.    Medications  Current Facility-Administered Medications:    albuterol (PROVENTIL) (2.5 MG/3ML) 0.083% nebulizer solution 2.5 mg, 2.5 mg, Nebulization, Q2H PRN, [place] Crane M, PA-C, 2.5 mg at 04/25/21 0802   ampicillin-sulbactam (UNASYN) 1.5 g in sodium chloride 0.9 % 100 mL IVPB, 1.5 g, Intravenous, Q8H, Lavina Hamman, MD, Stopped at 04/26/21 0524   bacitracin ointment 1 application, 1 application, Topical, BID, Agarwala, Ravi, MD, 1 application at 64/15/83 2237   budesonide (PULMICORT) nebulizer solution 0.25 mg, 0.25 mg, Nebulization, BID, Ayesha Rumpf, Stephanie M, PA-C, 0.25 mg at 04/25/21 2014   chlorhexidine gluconate (MEDLINE KIT) (PERIDEX) 0.12 % solution 15 mL, 15 mL, Mouth Rinse, BID, Renee Pain, MD, 15 mL at 04/26/21 0805   Chlorhexidine Gluconate  Cloth 2 % PADS 6 each, 6 each, Topical, Daily, Renee Pain, MD, 6 each at 04/25/21 1055   clobetasol cream (TEMOVATE) 0.94 % 1 application, 1 application, Topical, BID, Agarwala, Ravi, MD, 1 application at 07/68/08 2236   dextrose 10 % infusion, , Intravenous, Continuous, Anders Simmonds, MD, Last Rate: 30 mL/hr at 04/26/21 0800, Infusion Verify at 04/26/21 0800   heparin injection 5,000 Units, 5,000 Units, Subcutaneous, Q8H, [place] Crane M, PA-C, 5,000 Units at 04/26/21 0556   ipratropium-albuterol (DUONEB) 0.5-2.5 (3) MG/3ML nebulizer solution 3 mL, 3 mL, Nebulization, BID, Freda Jackson B, MD, 3 mL at 04/25/21 2014   levETIRAcetam (KEPPRA) 250 mg in sodium chloride 0.9 % 100 mL IVPB, 250 mg, Intravenous, Q12H, Amie Portland, MD, Stopped at 04/25/21 2307   lidocaine HCl (PF) (XYLOCAINE) 2 % injection 0-20 mL, 0-20 mL, Intradermal, Once PRN, Agarwala, Ravi, MD   metoprolol tartrate (LOPRESSOR) injection 2.5 mg, 2.5 mg, Intravenous, Q6H, Lavina Hamman, MD, 2.5 mg at 04/26/21 0556 Labs CBC    Component Value Date/Time   WBC 8.5 04/26/2021 0528   RBC 2.58 (L) 04/26/2021 0528   HGB 7.5 (L) 04/26/2021 0528   HCT 24.2 (L) 04/26/2021 0528   HCT 33.2 (L) 06/01/2019 2202   PLT 249 04/26/2021 0528   MCV 93.8 04/26/2021 0528   MCH 29.1 04/26/2021 0528   MCHC 31.0 04/26/2021 0528   RDW 14.5 04/26/2021 0528   LYMPHSABS 1.5 04/26/2021 0528   MONOABS 0.7 04/26/2021 0528   EOSABS 1.6 (H)  04/26/2021 0528   BASOSABS 0.0 04/26/2021 0528    CMP     Component Value Date/Time   NA 139 04/26/2021 0528   NA 142 11/22/2019 0000   K 3.4 (L) 04/26/2021 0528   CL 109 04/26/2021 0528   CO2 23 04/26/2021 0528   GLUCOSE 134 (H) 04/26/2021 0528   BUN 12 04/26/2021 0528   BUN 22 (A) 11/22/2019 0000   CREATININE 1.02 04/26/2021 0528   CREATININE 1.63 (H) 12/01/2018 1032   CALCIUM 8.5 (L) 04/26/2021 0528   PROT 6.2 (L) 04/25/2021 0635   ALBUMIN 2.3 (L) 04/26/2021 0528   AST 33 04/25/2021  0635   ALT 23 04/25/2021 0635   ALKPHOS 57 04/25/2021 0635   BILITOT 0.3 04/25/2021 0635   GFRNONAA >60 04/26/2021 0528   GFRNONAA 36 (L) 12/01/2018 1032   GFRAA >60 12/07/2019 0558   GFRAA 41 (L) 12/01/2018 1032   HSV PCR negative  Imaging No new imaging to review Assessment: 88 year old past medical history of dementia with behavioral disturbance, depression, hyperlipidemia, hyperglycemia, prior history of stroke with residual right-sided weakness, wheelchair-bound at baseline requiring 24/7 help with ADLs, brought in with seizure-like activity noted at the doctor's office with right arm shaking and facial twitching. Had to be intubated as he received multiple doses of benzodiazepines. EEG long-term and EEG overnight negative for ongoing seizure activity and was successfully extubated 2 days ago. Concern for viral encephalitis given skin rash.  CSF unremarkable for infectious etiology. Remained lethargic yesterday but today was able to open eyes and mumble some words. 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 Maintain seizure precautions Outpatient follow-up with neurology Please recall neurology service as needed. Plan discussed with Dr. Posey Pronto  -- Amie Portland, MD Neurologist [facility] Pager: [phone]

## 2021-04-26 NOTE — Progress Notes (Signed)
[facility] Progress Note  Patient: [name]    KGM:[id number]  DOA: 04/22/2021     Date of Service: the patient was seen and examined on 04/26/2021  Brief hospital course: Past medical history of CAD with PCI, HTN, HLD, dementia, CVA, type II DM, stage IV pressure ulcer.  Presents with complaints of seizure and status epilepticus.  Required intubation for airway protection on presentation. Currently plan is to monitor for improvement in oral intake for hydration or family to make decisions on goals of care.  Subjective: No nausea no vomiting.  No fever no chills.  Respiratory status improving.  Had some diarrhea yesterday.  No blood in the stool reported.  Assessment and Plan: 1.  Status epilepticus. Acute metabolic encephalopathy. History of dementia  History of stroke with right-sided weakness Chronically bedridden. Patient had a seizure while at wound care clinic. Prior history of seizures were also seen. Neurology was consulted on admission as the patient presented with status epilepticus. Started on IV Keppra. Due somnolence Keppra dose reduced to 250 mg twice daily for now. LP cultures negative. Was on acyclovir currently discontinued as HSV PCR is negative. No evidence of infection.   2.  Dysphagia Unable to work with speech therapy right now. Recommends NPO. Discontinue oral medication.  Awaiting goals of care conversation with the family.   3.  Acute respiratory failure Possible pneumonia History of COPD Required intubation at the time of admission for airway protection. Chest x-ray shows evidence of bilateral airspace opacities which appears to be improved in comparison to the prior x-ray. Will initiate Unasyn and continue antibiotics for 5 days.  Last day 9/29.   4.  Anemia normocytic Hemoglobin remained stable. The patient has no evidence of acute bleeding.  Monitor.   5.  Stage IV pressure ulcer sacrum, present on admission. Rash Continue foam  dressing and air mattress.  Wound care consulted as well. Biopsy was performed for a skin bulla.  Pathology currently pending.   6.  Hypertension, HLD, CAD Prior history of PCI in 2014. Aspirin was initiated.  Currently patient is unable to take anything p.o.  Holding. Blood pressure remains elevated. Will treat with IV Lopressor with holding parameters.   7.  Moderate malnutrition. Poor p.o. intake in the setting of dysphagia. Awaiting further clarity on goals of care. would have to decide on core track on Monday.   Body mass index is 20.3 kg/m.  Nutrition Problem: Moderate Malnutrition Etiology: chronic illness (dementia) Pressure Injury 07/12/19 Coccyx (Active)  07/12/19 2100  Location: Coccyx  Location Orientation:   Staging:   Wound Description (Comments):   Present on Admission:      DVT Prophylaxis:   heparin injection 5,000 Units Start: 04/23/21 0600 SCDs Start: 04/22/21 2204    Pt is Full code.  Communication: family was present at bedside, at the time of interview.  The pt provided permission to discuss medical plan with the family. Opportunity was given to ask question and all questions were answered satisfactorily.   Data Reviewed: I have personally reviewed and interpreted daily labs, tele strips, imaging. CBC stable.  Electrolytes stable.  Hemoglobin mildly low.  Will monitor.  Physical Exam:  General: Appear in mild distress, no Rash; Oral Mucosa Clear, moist. no Abnormal Neck Mass Or lumps, Conjunctiva normal  Cardiovascular: S1 and S2 Present, no Murmur, Respiratory: increased respiratory effort, Bilateral Air entry present and bilateral  Crackles, no wheezes Abdomen: Bowel Sound present, Soft and no tenderness Extremities: trace Pedal edema Neurology: alert  and oriented to time, place, and person affect appropriate. no new focal deficit Gait not checked due to patient safety concerns  Vitals:   04/26/21 1506 04/26/21 1600 04/26/21 1700 04/26/21  1800  BP:  138/63 126/63 (!) 131/56  Pulse:  80 87 72  Resp:  19 19 15   Temp: 97.8 F (36.6 C)     TempSrc: Oral     SpO2:  100% 100% 100%  Weight:      Height:        Disposition:  Status is: Inpatient  Remains inpatient appropriate because:IV treatments appropriate due to intensity of illness or inability to take PO  Dispo: The patient is from: Home              Anticipated d/c is to: SNF              Patient currently is not medically stable to d/c.   Difficult to place patient No        Time spent: 35 minutes. I reviewed all nursing notes, pharmacy notes, vitals, pertinent old records. I have discussed plan of care as described above with RN.  Author: Berle Mull, MD [facility] 04/26/2021 7:09 PM  To reach On-call, see care teams to locate the attending and reach out via [email or web address]. Between 7PM-7AM, please contact night-coverage If you still have difficulty reaching the attending provider, please page the [facility] (Director on Call) for [facility] on amion for assistance.

## 2021-04-27 DIAGNOSIS — E440 Moderate protein-calorie malnutrition: Secondary | ICD-10-CM | POA: Diagnosis not present

## 2021-04-27 DIAGNOSIS — J9600 Acute respiratory failure, unspecified whether with hypoxia or hypercapnia: Secondary | ICD-10-CM | POA: Diagnosis not present

## 2021-04-27 DIAGNOSIS — G40901 Epilepsy, unspecified, not intractable, with status epilepticus: Secondary | ICD-10-CM | POA: Diagnosis not present

## 2021-04-27 LAB — CULTURE, BLOOD (ROUTINE X 2)
Culture: NO GROWTH
Culture: NO GROWTH
Special Requests: ADEQUATE

## 2021-04-27 LAB — CBC
HCT: 25.5 % — ABNORMAL LOW (ref 39.0–52.0)
Hemoglobin: 8.1 g/dL — ABNORMAL LOW (ref 13.0–17.0)
MCH: 29.6 pg (ref 26.0–34.0)
MCHC: 31.8 g/dL (ref 30.0–36.0)
MCV: 93.1 fL (ref 80.0–100.0)
Platelets: 279 10*3/uL (ref 150–400)
RBC: 2.74 MIL/uL — ABNORMAL LOW (ref 4.22–5.81)
RDW: 14.4 % (ref 11.5–15.5)
WBC: 8.2 10*3/uL (ref 4.0–10.5)
nRBC: 0 % (ref 0.0–0.2)

## 2021-04-27 LAB — BASIC METABOLIC PANEL
Anion gap: 8 (ref 5–15)
BUN: 8 mg/dL (ref 8–23)
CO2: 25 mmol/L (ref 22–32)
Calcium: 8.6 mg/dL — ABNORMAL LOW (ref 8.9–10.3)
Chloride: 109 mmol/L (ref 98–111)
Creatinine, Ser: 0.9 mg/dL (ref 0.61–1.24)
GFR, Estimated: >60 mL/min (ref >60)
Glucose, Bld: 100 mg/dL — ABNORMAL HIGH (ref 70–99)
Potassium: 3.4 mmol/L — ABNORMAL LOW (ref 3.5–5.1)
Sodium: 142 mmol/L (ref 135–145)

## 2021-04-27 LAB — MAGNESIUM: Magnesium: 1.8 mg/dL (ref 1.7–2.4)

## 2021-04-27 LAB — GLUCOSE, CAPILLARY
Glucose-Capillary: 103 mg/dL — ABNORMAL HIGH (ref 70–99)
Glucose-Capillary: 106 mg/dL — ABNORMAL HIGH (ref 70–99)
Glucose-Capillary: 113 mg/dL — ABNORMAL HIGH (ref 70–99)
Glucose-Capillary: 118 mg/dL — ABNORMAL HIGH (ref 70–99)
Glucose-Capillary: 142 mg/dL — ABNORMAL HIGH (ref 70–99)
Glucose-Capillary: 88 mg/dL (ref 70–99)
Glucose-Capillary: 89 mg/dL (ref 70–99)

## 2021-04-27 NOTE — Progress Notes (Signed)
[facility] Progress Note  Patient: [name]    BMW:[id number]  DOA: 04/22/2021     Date of Service: the patient was seen and examined on 04/27/2021  Brief hospital course: Past medical history of CAD with PCI, HTN, HLD, dementia, CVA, type II DM, stage IV pressure ulcer.  Presents with complaints of seizure and status epilepticus.  Required intubation for airway protection on presentation. Currently plan is to monitor for improvement in oral intake for hydration or family to make decisions on goals of care.  Subjective: No acute complaint.  More awake.  Continues to have some cough.  He remains nonverbal.  After transitioning to dysphagia 1 diet patient had an episode of bouts of cough.  Back to n.p.o.  Assessment and Plan: 1.  Status epilepticus. Acute metabolic encephalopathy. History of dementia  History of stroke with right-sided weakness Chronically bedridden. Patient had a seizure while at wound care clinic. Prior history of seizures were also seen. Neurology was consulted on admission as the patient presented with status epilepticus. Started on IV Keppra. Due somnolence Keppra dose reduced to 250 mg twice daily for now. LP cultures negative. Was on acyclovir currently discontinued as HSV PCR is negative. No evidence of infection.   2.  Dysphagia Discontinue oral medication.  Awaiting goals of care conversation with the family. Speech therapy advance the diet but patient unable to tolerate it therefore n.p.o. again.   3.  Acute respiratory failure Possible pneumonia History of COPD Required intubation at the time of admission for airway protection. Chest x-ray shows evidence of bilateral airspace opacities which appears to be improved in comparison to the prior x-ray. Will initiate Unasyn and continue antibiotics for 5 days.  Last day 9/29.   4.  Anemia normocytic Hemoglobin remained stable. The patient has no evidence of acute bleeding.  Monitor.   5.  Stage  IV pressure ulcer sacrum, present on admission. Rash Continue foam dressing and air mattress.  Wound care consulted as well. Biopsy was performed for a skin bulla.  Pathology currently pending.   6.  Hypertension, HLD, CAD Prior history of PCI in 2014. Aspirin was initiated.  Currently patient is unable to take anything p.o.  Holding. Blood pressure remains elevated. Will treat with IV Lopressor with holding parameters.   7.  Moderate malnutrition. Poor p.o. intake in the setting of dysphagia. Awaiting further clarity on goals of care. would have to decide on core track on Monday.   8.  Bullous disease. Biopsy currently pending. Monitor.    Body mass index is 20.3 kg/m.  Nutrition Problem: Moderate Malnutrition Etiology: chronic illness (dementia) Pressure Injury 07/12/19 Coccyx (Active)  07/12/19 2100  Location: Coccyx  Location Orientation:   Staging:   Wound Description (Comments):   Present on Admission:      DVT Prophylaxis:   heparin injection 5,000 Units Start: 04/23/21 0600 SCDs Start: 04/22/21 2204    Pt is Full code.  Communication: family was present at bedside, at the time of interview.  The pt provided permission to discuss medical plan with the family. Opportunity was given to ask question and all questions were answered satisfactorily.   Data Reviewed: I have personally reviewed and interpreted daily labs, tele strips, imaging. CBG stable.  Mild hypokalemia.  Serum creatinine normal.  CBC stable.  Physical Exam:  General: Appear in mild distress, no Rash; Oral Mucosa Clear, moist. no Abnormal Neck Mass Or lumps, Conjunctiva normal  Cardiovascular: S1 and S2 Present, no Murmur, Respiratory: good respiratory  effort, Bilateral Air entry present and bilateral crackles, no wheezes Abdomen: Bowel Sound present, Soft and no tenderness Extremities: Bilateral trace pedal edema Neurology: alert and nonverbal, not oriented to time, place, and person affect  flat. no new focal deficit Gait not checked due to patient safety concerns   Vitals:   04/27/21 1730 04/27/21 1800 04/27/21 1900 04/27/21 2028  BP: 128/86 (!) 144/66 133/63 (!) 145/67  Pulse: 79 66 72 79  Resp: (!) 6 18 16 20   Temp:    97.6 F (36.4 C)  TempSrc:    Axillary  SpO2: 100% 100% 100% 100%  Weight:      Height:        Disposition:  Status is: Inpatient  Remains inpatient appropriate because:IV treatments appropriate due to intensity of illness or inability to take PO  Dispo: The patient is from: Home              Anticipated d/c is to: SNF              Patient currently is not medically stable to d/c.   Difficult to place patient No        Time spent: 35 minutes. I reviewed all nursing notes, pharmacy notes, vitals, pertinent old records. I have discussed plan of care as described above with RN.  Author: Berle Mull, MD [facility] 04/27/2021 8:59 PM  To reach On-call, see care teams to locate the attending and reach out via [email or web address]. Between 7PM-7AM, please contact night-coverage If you still have difficulty reaching the attending provider, please page the [facility] (Director on Call) for [facility] on amion for assistance.

## 2021-04-27 NOTE — Progress Notes (Signed)
Report called to [place] bed 6 by day nurse at change of shift. Patient to be transferred at this time, condition stable for transfer. See vitals and flow sheet.

## 2021-04-27 NOTE — Progress Notes (Signed)
SLP Cancellation Note  Patient Details Name: [name] MRN: [id number] DOB: 10-Nov-1921   Cancelled treatment:       Reason Eval/Treat Not Completed: Patient at procedure or test/unavailable (Pt was previously receiving care from RN and NT and is currently working with OT. SLP will follow up.)  Kori Colin I. Hardin Negus, [place], [place] Office number [phone] Pager [place] 04/27/2021, 12:08 PM

## 2021-04-27 NOTE — Progress Notes (Signed)
Daily Progress Note   Patient Name: [name]       Date: 04/27/2021 DOB: 02-Dec-1921  Age: 87 y.o. MRN#: [id number] Attending Physician: Lavina Hamman, MD Primary Care Physician: Patient, No Pcp Per (Inactive) Admit Date: 04/22/2021  Reason for Consultation/Follow-up: Establishing goals of care  Subjective: Medical records reviewed. Discussed with RN Janett Billow and assessed patient at the bedside. Remains NPO at this time. No family present.  Called patient's daughter [name] to follow up on goals of care conversation. She is still considering whether to proceed with cortrak for nutrition. Counseled on the risks and benefits of artificial nutrition and shared my concern that this may cause more suffering rather than improving his quality of life. Discussed possible need for PEG if he does not improve with temporary NG tube. She agrees to speak with PMT again tomorrow and understands the importance of patient's nutrition if the goal remains to prolong his life with medical interventions.  Questions and concerns addressed. PMT will continue to support holistically.   Length of Stay: 5  Current Medications: Scheduled Meds:   bacitracin  1 application Topical BID   budesonide (PULMICORT) nebulizer solution  0.25 mg Nebulization BID   chlorhexidine gluconate (MEDLINE KIT)  15 mL Mouth Rinse BID   Chlorhexidine Gluconate Cloth  6 each Topical Daily   clobetasol cream  1 application Topical BID   heparin  5,000 Units Subcutaneous Q8H   ipratropium-albuterol  3 mL Nebulization BID   metoprolol tartrate  2.5 mg Intravenous Q6H    Continuous Infusions:  ampicillin-sulbactam (UNASYN) IV Stopped (04/27/21 0458)   dextrose 30 mL/hr at 04/27/21 0800   levETIRAcetam Stopped (04/26/21 2305)    PRN  Meds: albuterol, lidocaine HCl (PF)  Physical Exam Vitals and nursing note reviewed.  Constitutional:      General: He is not in acute distress.    Appearance: He is ill-appearing.     Interventions: Nasal cannula in place.     Comments: Elderly AA male  Cardiovascular:     Rate and Rhythm: Normal rate.  Pulmonary:     Effort: Pulmonary effort is normal. No respiratory distress.  Neurological:     Mental Status: Mental status is at baseline.            Vital Signs: BP 133/63 (BP Location: Right Arm)  Pulse 74   Temp 98.1 F (36.7 C) (Oral)   Resp 18   Ht 6' (1.829 m)   Wt 67.9 kg   SpO2 100%   BMI 20.30 kg/m  SpO2: SpO2: 100 % O2 Device: O2 Device: Nasal Cannula O2 Flow Rate: O2 Flow Rate (L/min): 1 L/min  Intake/output summary:  Intake/Output Summary (Last 24 hours) at 04/27/2021 0917 Last data filed at 04/27/2021 0800 Gross per 24 hour  Intake 1101.54 ml  Output 1150 ml  Net -48.46 ml   LBM: Last BM Date: 04/27/21 Baseline Weight: Weight: 79.4 kg Most recent weight: Weight: 67.9 kg       Palliative Assessment/Data: 10%    Flowsheet Rows    Flowsheet Row Most Recent Value  Intake Tab   Referral Department Critical care  Unit at Time of Referral ICU  Palliative Care Primary Diagnosis Neurology  Date Notified 04/24/21  Palliative Care Type New Palliative care  Reason for referral Clarify Goals of Care  Date of Admission 04/22/21  Date first seen by Palliative Care 04/24/21  # of days Palliative referral response time 0 Day(s)  # of days IP prior to Palliative referral 2  Clinical Assessment   Psychosocial & Spiritual Assessment   Palliative Care Outcomes        Patient Active Problem List   Diagnosis Date Noted   Malnutrition of moderate degree 04/23/2021   Status epilepticus ([place]) 04/22/2021   Normocytic anemia 04/22/2021   Skin bulla 04/22/2021   Endotracheally intubated 04/22/2021   Abnormal CXR 04/22/2021   Advanced dementia ([place])     Goals of care, counseling/discussion    Palliative care by specialist    Osteomyelitis ([place]) 12/05/2019   Multiple skin tears    Pressure injury of sacral region, stage 4 ([place]) 10/12/2019   Vascular dementia with behavior disturbance ([place]) 10/12/2019   Bilateral hearing loss due to cerumen impaction 10/12/2019   Debility 10/12/2019   Pneumonia due to 2019 novel coronavirus 07/06/2019   Pneumonia due to COVID-19 virus 07/05/2019   Pulmonary embolism ([place]) 06/01/2019   Acute renal failure superimposed on stage 3 chronic kidney disease ([place]) 06/01/2019   Chronic diastolic CHF (congestive heart failure) ([place]) 06/01/2019   Pain due to onychomycosis of toenails of both feet 01/23/2019   Bilateral lower extremity edema 07/05/2017   Pulmonary nodules 04/13/2017   Centrilobular emphysema ([place]) 04/13/2017   Calculus of gallbladder without cholecystitis without obstruction 04/13/2017   Chronic cough 04/13/2017   Adenoma of left adrenal gland 04/13/2017   Frequent falls 04/01/2017   Type 2 diabetes mellitus with stage 3 chronic kidney disease, with long-term current use of insulin ([place]) 04/01/2017   History of tobacco abuse 04/01/2017   Unsteady gait 04/01/2017   Dysarthria 04/01/2017   History of stroke    Hyperlipidemia    End of life care 11/19/2015   Moderate dementia with behavioral disturbance ([place]) 09/19/2015   Depression 09/19/2015   Hypertensive heart disease 09/10/2015   Type 2 diabetes, controlled, with peripheral circulatory disorder ([place])    Essential hypertension    Coronary artery disease involving native coronary artery of native heart with angina pectoris ([place])    Arthritis    Glaucoma     Palliative Care Assessment & Plan   Patient Profile: 87 y.o. male  with past medical history of dementia with behavioral disturbance, depression, hyperlipidemia, hyperglycemia, prior history of stroke with residual right-sided weakness, wheelchair-bound at baseline requiring 24/7 help  with ADLs, CAD s/p PCI, stage 4  pressure ulcer, admitted on 04/22/2021 with new onset seizures.    Patient is nonverbal and bedbound at baseline. Palliative medicine has been consulted to assist with goals of care conversation.  Assessment: Status epilepticus Acute metabolic encephalopathy History of stroke Advanced dementia Dysphagia Acute respiratory failure, improving Goals of care conversation Moderate protein-calorie malnutrition Stage IV sacral pressure ulcer  Recommendations/Plan: Patient's daughter [name] remains hesitant to proceed with cortrak, would like to continue deliberating and discuss again tomorrow Psychosocial and emotional support provided PMT will continue to support as family is faced with complex decision-making  Prognosis:  Guarded, would certainly be hospice appropriate if goals of care aligned  Discharge Planning: To Be Determined  Care plan was discussed with patient's daughter [name], Dr. Posey Pronto  Total time: 25 minutes Greater than 50% of this time was spent in counseling and coordinating care related to the above assessment and plan.  Dorthy Cooler, PA-C Palliative Medicine Team Team phone # [phone]  Thank you for allowing the Palliative Medicine Team to assist in the care of this patient. Please utilize secure chat with additional questions, if there is no response within 30 minutes please call the above phone number.  Palliative Medicine Team providers are available by phone from 7am to 7pm daily and can be reached through the team cell phone.  Should this patient require assistance outside of these hours, please call the patient's attending physician.

## 2021-04-27 NOTE — Progress Notes (Signed)
Speech Language Pathology Treatment: Dysphagia  Patient Details Name: [name] MRN: [id number] DOB: [birth year] Today's Date: 04/27/2021 Time: 6256-3893 SLP Time Calculation (min) (ACUTE ONLY): 22 min  Assessment / Plan / Recommendation Clinical Impression  Pt was seen for co-treatment session with PT who facilitated optimal positioning of the pt. Pt's daughter was present and she described a baseline diet similar to the hospital's dysphagia 2. She further reported that the pt often consumes thin liquids, but that she uses thickener if he demonstrates coughing with liquids. Per the daughter, this occurs less than once per week and she does not thicken it to a specific consistency. Pt consistently demonstrated oral holding with all solid and liquids. Throat coughing and occasional coughing were noted with thin liquids via cup and straw; however, pt tolerated nectar thick liquids and puree solids without overt s/sx of aspiration. Oral clearance was WNL. Pt's oral holding was discussed with the pt's daughter who reported that this is the pt's baseline and that he eats "very slowly". A dysphagia 1 diet with nectar thick liquids is recommended at this time. SLP will continue to follow pt.    HPI HPI: This 87 y.o. African American male  presented to the [facility] Emergency Department via EMS on 9/22 with complaints of new onset seizure disorder.  The patient was intubated in the ER for airway protection. Extubated on 9/23. PMH: CAD with DES 2014, HTN, HLD, Dementia, Stroke, DMII, stage 4 PI, chronic sacral ulcer getting wound care      SLP Plan  Continue with current plan of care      Recommendations for follow up therapy are one component of a multi-disciplinary discharge planning process, led by the attending physician.  Recommendations may be updated based on patient status, additional functional criteria and insurance authorization.    Recommendations  Diet  recommendations: Nectar-thick liquid;Dysphagia 1 (puree) Liquids provided via: Cup;Straw Medication Administration: Crushed with puree Supervision: Trained caregiver to feed patient Compensations: Slow rate;Small sips/bites Postural Changes and/or Swallow Maneuvers: Seated upright 90 degrees                Oral Care Recommendations: Staff/trained caregiver to provide oral care;Oral care BID Follow up Recommendations: Other (comment) (TBD) SLP Visit Diagnosis: Dysphagia, unspecified (R13.10) Plan: Continue with current plan of care       Anina Schnake I. Hardin Negus, [place], [place] Office number [phone] Pager [place]  04/27/2021, 4:47 PM

## 2021-04-27 NOTE — Evaluation (Signed)
Occupational Therapy Evaluation Patient Details Name: [name] MRN: [id number] DOB: 1922-04-01 Today's Date: 04/27/2021   History of Present Illness This 87 y.o. African American male  presented to the [facility] Emergency Department via EMS on 9/22 with complaints of new onset seizure disorder.  The patient was intubated in the ER for airway protection. Extubated on 9/23. PMH: CAD with DES 2014, HTN, HLD, Dementia, Stroke, DMII, stage 4 PI, chronic sacral ulcer getting wound care   Clinical Impression   Pt with baseline dependence in mobility and all ADL. Demonstrates tightness B shoulders and elbows with flexion contractures in R hand. Pt could benefit from mattress overlay and gel vs Roho cushion for w/c for pressure relief. No further OT needs.      Recommendations for follow up therapy are one component of a multi-disciplinary discharge planning process, led by the attending physician.  Recommendations may be updated based on patient status, additional functional criteria and insurance authorization.   Follow Up Recommendations  No OT follow up    Equipment Recommendations  Wheelchair cushion (measurements OT) (mattress overlay)    Recommendations for Other Services       Precautions / Restrictions Precautions Precautions: Fall      Mobility Bed Mobility Overal bed mobility: Needs Assistance Bed Mobility: Rolling Rolling: Total assist              Transfers                      Balance                                           ADL either performed or assessed with clinical judgement   ADL Overall ADL's : At baseline                                             Vision Patient Visual Report: No change from baseline       Perception     Praxis      Pertinent Vitals/Pain Pain Assessment: Faces Faces Pain Scale: No hurt     Hand Dominance Right   Extremity/Trunk Assessment Upper  Extremity Assessment Upper Extremity Assessment: RUE deficits/detail;LUE deficits/detail RUE Deficits / Details: flexion contractures in hand, PROM full sh flexion to 90/elbow, no functional movement noted RUE Coordination: decreased fine motor;decreased gross motor LUE Deficits / Details: tightness in L shoulder and elbow, PROM to 90 degrees, full elbow and hand extension, tightness in fingers with flexion, no functional movement LUE Coordination: decreased fine motor;decreased gross motor   Lower Extremity Assessment Lower Extremity Assessment: Defer to PT evaluation       Communication Communication Communication: Expressive difficulties;HOH (perseverates on stating his name)   Cognition Arousal/Alertness: Awake/alert Behavior During Therapy: Flat affect Overall Cognitive Status: History of cognitive impairments - at baseline                                     General Comments       Exercises     Shoulder Instructions      Home Living Family/patient expects to be discharged to:: Private residence Living Arrangements: Children Available Help  at Discharge: Family;Available 24 hours/day Type of Home: House Home Access: Stairs to enter [employer] of Steps: 1   Home Layout: One level     Bathroom Shower/Tub: Walk-in shower         Home Equipment: Environmental consultant - 2 wheels;Cane - single point;Wheelchair - manual;Hospital bed;Other (comment) (hoyer lift)          Prior Functioning/Environment Level of Independence: Needs assistance  Gait / Transfers Assistance Needed: bedbound, family used hoyer lift to get pt into w/c ADL's / Homemaking Assistance Needed: total assist for all aspects of B/D and feeding per daughter            OT Problem List:        OT Treatment/Interventions:      OT Goals(Current goals can be found in the care plan section) Acute Rehab OT Goals Patient Stated Goal: daughter plans to care for pt at home  OT  Frequency:     Barriers to D/C:            Co-evaluation              AM-PAC OT "6 Clicks" Daily Activity     Outcome Measure Help from another person eating meals?: Total Help from another person taking care of personal grooming?: Total Help from another person toileting, which includes using toliet, bedpan, or urinal?: Total Help from another person bathing (including washing, rinsing, drying)?: Total Help from another person to put on and taking off regular upper body clothing?: Total Help from another person to put on and taking off regular lower body clothing?: Total 6 Click Score: 6   End of Session    Activity Tolerance: Patient tolerated treatment well Patient left: in bed;with call bell/phone within reach  OT Visit Diagnosis: Muscle weakness (generalized) (M62.81);Other symptoms and signs involving cognitive function                Time: 1205-1220 OT Time Calculation (min): 15 min Charges:  OT General Charges $OT Visit: 1 Visit OT Evaluation $OT Eval Moderate Complexity: 1 Mod  [name], OTR/L Acute Rehabilitation Services Pager: [phone] Office: [phone]   Malka So 04/27/2021, 12:26 PM

## 2021-04-27 NOTE — Progress Notes (Signed)
Physical Therapy Treatment Patient Details Name: [name] MRN: [id number] DOB: 1922/05/31 Today's Date: 04/27/2021   History of Present Illness This 87 y.o. African American male  presented to the [facility] Emergency Department via EMS on 9/22 with complaints of new onset seizure disorder.  The patient was intubated in the ER for airway protection. Extubated on 9/23. PMH: CAD with DES 2014, HTN, HLD, Dementia, Stroke, DMII, stage 4 PI, chronic sacral ulcer getting wound care    PT Comments    Will continue with the trial.  I expect we ultimately have little to offer in functional changes, but hopefully could work on stiffness, contracture management and hopefull decrease burden of care.    Recommendations for follow up therapy are one component of a multi-disciplinary discharge planning process, led by the attending physician.  Recommendations may be updated based on patient status, additional functional criteria and insurance authorization.  Follow Up Recommendations  No PT follow up;Supervision/Assistance - 24 hour     Equipment Recommendations  None recommended by PT    Recommendations for Other Services       Precautions / Restrictions Precautions Precautions: Fall     Mobility  Bed Mobility Overal bed mobility: Needs Assistance Bed Mobility: Supine to Sit;Sit to Supine     Supine to sit: Total assist Sit to supine: Total assist;+2 for physical assistance   General bed mobility comments: Within session unable to break up pt's tone to allow him to assist transitioning to/from EOB    Transfers Overall transfer level: Needs assistance Equipment used:  (safest with lift pad)                Ambulation/Gait                 Stairs             Wheelchair Mobility    Modified Rankin (Stroke Patients Only)       Balance Overall balance assessment: Needs assistance Sitting-balance support: No upper extremity  supported;Feet supported Sitting balance-Leahy Scale: Zero Sitting balance - Comments: pt with tight paraspinals, taking ~5+ min to attain  upright posture, not at neutral pelvic tilt. Worked to stretch trunk in rotation and extension.  Note no voluntary movement of extremities and trace abs and/or paraspinals                                    Cognition Arousal/Alertness: Awake/alert Behavior During Therapy: Flat affect Overall Cognitive Status: History of cognitive impairments - at baseline                                        Exercises Other Exercises Other Exercises: PROM to Bil LE and L UE with stress on contracture management/    General Comments General comments (skin integrity, edema, etc.): vss overall.  Pt underwent a swallowing assessment while sitting EOB.  He was anticipatory of food being offered and opened his mouth in anticipation.      Pertinent Vitals/Pain Pain Assessment: Faces Faces Pain Scale: No hurt    Home Living Family/patient expects to be discharged to:: Private residence Living Arrangements: Children Available Help at Discharge: Family;Available 24 hours/day Type of Home: House Home Access: Stairs to enter   Home Layout: One level Home Equipment: Environmental consultant - 2 wheels;Cane -  single point;Wheelchair - manual;Hospital bed;Other (comment) (hoyer lift) Additional Comments: per dtr, patient's bed has an air overlay    Prior Function Level of Independence: Needs assistance  Gait / Transfers Assistance Needed: bedbound, family used hoyer lift to get pt into w/c ADL's / Homemaking Assistance Needed: total assist for all aspects of B/D and feeding per daughter     PT Goals (current goals can now be found in the care plan section) Acute Rehab PT Goals Patient Stated Goal: daughter plans to care for pt at home PT Goal Formulation: Patient unable to participate in goal setting Time For Goal Achievement: 05/08/21 Potential to  Achieve Goals: Fair    Frequency    Min 2X/week (trial)      PT Plan      Co-evaluation              AM-PAC PT "6 Clicks" Mobility   Outcome Measure  Help needed turning from your back to your side while in a flat bed without using bedrails?: Total Help needed moving from lying on your back to sitting on the side of a flat bed without using bedrails?: Total Help needed moving to and from a bed to a chair (including a wheelchair)?: Total Help needed standing up from a chair using your arms (e.g., wheelchair or bedside chair)?: Total Help needed to walk in hospital room?: Total Help needed climbing 3-5 steps with a railing? : Total 6 Click Score: 6    End of Session   Activity Tolerance: Patient tolerated treatment well Patient left: in bed;with call bell/phone within reach;with bed alarm set Nurse Communication: Mobility status;Need for lift equipment PT Visit Diagnosis: Muscle weakness (generalized) (M62.81);Other (comment);Other abnormalities of gait and mobility (R26.89) (contracture)     Time: 5465-0354 PT Time Calculation (min) (ACUTE ONLY): 28 min  Charges:  $Therapeutic Exercise: 8-22 mins $Therapeutic Activity: 8-22 mins                     04/27/2021  [name]., PT Acute Rehabilitation Services [phone]  (pager) [phone]  (office)   [name] [name] 04/27/2021, 6:37 PM

## 2021-04-27 NOTE — Progress Notes (Addendum)
Patient's dinner tray was delivered to the room. After the SLP consult the patient's daughter was allowed to finish giving him the cup of pudding. This RN observed the patient's daughter giving small bites of pureed food and small sips via straw (which was approved by SLP). This RN exited the room but then heard the patient coughing. This RN entered the room and the patient was coughing after taking a bite of food and sip of thickened liquid. The patient's daughter wants to take a break and resume feeding the patient in a little bit. This RN expressed concerns that the patient is aspirating and that we should not give anymore PO food/thickened liquid at this time until I speak with MD or SLP. Patient's daughter verbalized understanding.   43 Spoke with MD who gave verbal order for NPO until patient is reassessed by SLP

## 2021-04-28 DIAGNOSIS — Z515 Encounter for palliative care: Secondary | ICD-10-CM | POA: Diagnosis not present

## 2021-04-28 DIAGNOSIS — E440 Moderate protein-calorie malnutrition: Secondary | ICD-10-CM | POA: Diagnosis not present

## 2021-04-28 DIAGNOSIS — R1310 Dysphagia, unspecified: Secondary | ICD-10-CM | POA: Diagnosis not present

## 2021-04-28 DIAGNOSIS — J9600 Acute respiratory failure, unspecified whether with hypoxia or hypercapnia: Secondary | ICD-10-CM | POA: Diagnosis not present

## 2021-04-28 DIAGNOSIS — R627 Adult failure to thrive: Secondary | ICD-10-CM | POA: Diagnosis not present

## 2021-04-28 DIAGNOSIS — G40901 Epilepsy, unspecified, not intractable, with status epilepticus: Secondary | ICD-10-CM | POA: Diagnosis not present

## 2021-04-28 DIAGNOSIS — Z7189 Other specified counseling: Secondary | ICD-10-CM | POA: Diagnosis not present

## 2021-04-28 LAB — BASIC METABOLIC PANEL
Anion gap: 7 (ref 5–15)
BUN: 7 mg/dL — ABNORMAL LOW (ref 8–23)
CO2: 25 mmol/L (ref 22–32)
Calcium: 8.7 mg/dL — ABNORMAL LOW (ref 8.9–10.3)
Chloride: 108 mmol/L (ref 98–111)
Creatinine, Ser: 0.92 mg/dL (ref 0.61–1.24)
GFR, Estimated: >60 mL/min (ref >60)
Glucose, Bld: 118 mg/dL — ABNORMAL HIGH (ref 70–99)
Potassium: 3 mmol/L — ABNORMAL LOW (ref 3.5–5.1)
Sodium: 140 mmol/L (ref 135–145)

## 2021-04-28 LAB — GLUCOSE, CAPILLARY
Glucose-Capillary: 113 mg/dL — ABNORMAL HIGH (ref 70–99)
Glucose-Capillary: 107 mg/dL — ABNORMAL HIGH (ref 70–99)
Glucose-Capillary: 122 mg/dL — ABNORMAL HIGH (ref 70–99)
Glucose-Capillary: 123 mg/dL — ABNORMAL HIGH (ref 70–99)
Glucose-Capillary: 154 mg/dL — ABNORMAL HIGH (ref 70–99)
Glucose-Capillary: 161 mg/dL — ABNORMAL HIGH (ref 70–99)

## 2021-04-28 LAB — CBC
HCT: 25.3 % — ABNORMAL LOW (ref 39.0–52.0)
Hemoglobin: 7.9 g/dL — ABNORMAL LOW (ref 13.0–17.0)
MCH: 29.2 pg (ref 26.0–34.0)
MCHC: 31.2 g/dL (ref 30.0–36.0)
MCV: 93.4 fL (ref 80.0–100.0)
Platelets: 244 10*3/uL (ref 150–400)
RBC: 2.71 MIL/uL — ABNORMAL LOW (ref 4.22–5.81)
RDW: 14.3 % (ref 11.5–15.5)
WBC: 6.7 10*3/uL (ref 4.0–10.5)
nRBC: 0 % (ref 0.0–0.2)

## 2021-04-28 LAB — MAGNESIUM: Magnesium: 1.7 mg/dL (ref 1.7–2.4)

## 2021-04-28 MED ORDER — NEPRO/CARBSTEADY PO LIQD
237.0000 mL | Freq: Three times a day (TID) | ORAL | Status: DC
  Administered 2021-04-28 – 2021-05-02 (×11): 237 mL via ORAL

## 2021-04-28 MED ORDER — POTASSIUM CHLORIDE 10 MEQ/100ML IV SOLN
10.0000 meq | INTRAVENOUS | Status: AC
  Administered 2021-04-28 (×4): 10 meq via INTRAVENOUS
  Filled 2021-04-28 (×4): qty 100

## 2021-04-28 MED ORDER — ADULT MULTIVITAMIN W/MINERALS CH
1.0000 | ORAL_TABLET | Freq: Every day | ORAL | Status: DC
  Administered 2021-04-28 – 2021-05-02 (×5): 1 via ORAL
  Filled 2021-04-28 (×5): qty 1

## 2021-04-28 NOTE — Progress Notes (Addendum)
Spoke with bonnie, slp for patient reassessment for eating, will continue to monitor.   Received call from slp, will be able to see patient after noon today.

## 2021-04-28 NOTE — Progress Notes (Signed)
[facility] Progress Note  Patient: [name]    JSE:[id number]  DOA: 04/22/2021     Date of Service: the patient was seen and examined on 04/28/2021  Brief hospital course: Past medical history of CAD with PCI, HTN, HLD, dementia, CVA, type II DM, stage IV pressure ulcer.  Presents with complaints of seizure and status epilepticus.  Required intubation for airway protection on presentation. Currently plan is to monitor for improvement in oral intake for hydration or family to make decisions on goals of care.  Subjective: No acute event.  No nausea no vomiting no fever no chills.  Had some cough yesterday.  Speech therapy advance the diet again today.  Assessment and Plan: 1.  Status epilepticus. Acute metabolic encephalopathy. History of dementia  History of stroke with right-sided weakness Chronically bedridden. Patient had a seizure while at wound care clinic. Prior history of seizures were also seen. Neurology was consulted on admission as the patient presented with status epilepticus. Started on IV Keppra. Due somnolence Keppra dose reduced to 250 mg twice daily for now. LP cultures negative. Was on acyclovir currently discontinued as HSV PCR is negative. No evidence of infection.   2.  Dysphagia Discontinue oral medication.  Awaiting goals of care conversation with the family. Speech therapy advance the diet but patient unable to tolerate it therefore n.p.o. again. 9/27 speech evaluation shows that the patient can tolerate p.o. and therefore now back on dysphagia diet.   3.  Acute respiratory failure Possible pneumonia History of COPD Required intubation at the time of admission for airway protection. Chest x-ray shows evidence of bilateral airspace opacities which appears to be improved in comparison to the prior x-ray. Started on Unasyn and continue antibiotics for 5 days.  Last day 9/29.   4.  Anemia normocytic Hemoglobin remained stable. The patient has no  evidence of acute bleeding.  Monitor.   5.  Stage IV pressure ulcer sacrum, present on admission. Rash Continue foam dressing and air mattress.  Wound care consulted as well. Biopsy was performed for a skin bulla.  Pathology currently pending.   6.  Hypertension, HLD, CAD Prior history of PCI in 2014. Aspirin was initiated.  Currently patient is unable to take anything p.o.  Holding. Blood pressure remains elevated. Will treat with IV Lopressor with holding parameters.   7.  Moderate malnutrition. Poor p.o. intake in the setting of dysphagia. Awaiting further clarity on goals of care. would have to decide on core track on Monday.   8.  Bullous disease. Biopsy currently pending. Monitor.   9.  Goals of care conversation. Extensive discussion with the daughter at the bedside. If she wants to pursue full code patient will require NG tube insertion for nutrition as I am worried that the patient will not be able to meet his criteria nutritional requirements.  Dietary consulted for this.   Body mass index is 20.63 kg/m.  Nutrition Problem: Moderate Malnutrition Etiology: chronic illness (dementia) Pressure Injury 07/12/19 Coccyx (Active)  07/12/19 2100  Location: Coccyx  Location Orientation:   Staging:   Wound Description (Comments):   Present on Admission:      DVT Prophylaxis:   heparin injection 5,000 Units Start: 04/23/21 0600 SCDs Start: 04/22/21 2204    Pt is Full code.  Communication: family was present at bedside, at the time of interview.   Data Reviewed: I have personally reviewed and interpreted daily labs, tele strips, imaging. CBG stable.  Mild hypokalemia.  Physical Exam:  General: Appear  in mild distress, no Rash; Oral Mucosa Clear, moist. no Abnormal Neck Mass Or lumps, Conjunctiva normal  Cardiovascular: S1 and S2 Present, no Murmur, Respiratory: good respiratory effort, Bilateral Air entry present and CTA, no Crackles, no wheezes Abdomen: Bowel  Sound present, Soft and no tenderness Extremities: no Pedal edema Neurology: alert and nonverbal, not oriented to time, place, and person affect appropriate. no new focal deficit Gait not checked due to patient safety concerns   Vitals:   04/28/21 0758 04/28/21 1622 04/28/21 2009 04/28/21 2024  BP: (!) 124/53 (!) 151/62  (!) 134/56  Pulse: 80 97  88  Resp: (!) 25 (!) 21  (!) 21  Temp: (!) 97.3 F (36.3 C) 98.6 F (37 C)  98.2 F (36.8 C)  TempSrc: Axillary Axillary  Oral  SpO2: 99% 95% 100% 98%  Weight:      Height:        Disposition:  Status is: Inpatient  Remains inpatient appropriate because:IV treatments appropriate due to intensity of illness or inability to take PO  Dispo: The patient is from: Home              Anticipated d/c is to: SNF              Patient currently is not medically stable to d/c.   Difficult to place patient No        Time spent: 35 minutes. I reviewed all nursing notes, pharmacy notes, vitals, pertinent old records. I have discussed plan of care as described above with RN.  Author: Berle Mull, MD [facility] 04/28/2021 8:27 PM  To reach On-call, see care teams to locate the attending and reach out via [email or web address]. Between 7PM-7AM, please contact night-coverage If you still have difficulty reaching the attending provider, please page the [facility] (Director on Call) for [facility] on amion for assistance.

## 2021-04-28 NOTE — Care Management Important Message (Signed)
Important Message  Patient Details  Name: [name] MRN: [id number] Date of Birth: Apr 17, 1922   Medicare Important Message Given:  Yes     Neftali Abair 04/28/2021, 1:58 PM

## 2021-04-28 NOTE — Progress Notes (Signed)
Nutrition Follow-up  DOCUMENTATION CODES:   Non-severe (moderate) malnutrition in context of chronic illness  INTERVENTION:   -Nepro Shake po TID, each supplement provides 425 kcal and 19 grams protein  -MVI with minerals daily -Magic cup TID with meals, each supplement provides 290 kcal and 9 grams of protein  -Feeding assistance with meals  NUTRITION DIAGNOSIS:   Moderate Malnutrition related to chronic illness (dementia) as evidenced by moderate fat depletion, severe muscle depletion.  Ongoing  GOAL:   Patient will meet greater than or equal to 90% of their needs  Progressing   MONITOR:   Vent status, Labs, Weight trends, TF tolerance, Skin  REASON FOR ASSESSMENT:   Ventilator, Consult Enteral/tube feeding initiation and management  ASSESSMENT:   87 year old male who presented to the ED on 9/21 after having seizure-like activity. Pt required intubation in the ED. PMH of dementia with behavioral disturbance, depression, HLD, HTN, stroke, T2DM, CAD, wheelchair-bound at baseline.  9/22- s/p skin bunch biopsy for unexplained bullous rash 9/23- extubated  9/26- s/p BSE- dysphagia 1 diet with nectar thick liquids, diet held due to pt coughing when eating 9/27- s/p BSE- advanced to dysphagia 1 diet with nectar thick liquids  Reviewed I/O's: +57 ml x 24 hours and +6.9 L since admission  UOP: 850 ml x 24 hours  Pt just re-evaluated by SLP and dysphagia 1 diet with nectar thick liquids has been resumed. Diet held last night secondary to concern for aspiration and pt was coughing after PO intake.   Palliative care following for goals of care discussions. Pt daughter having trouble deciding about cortrak tube vs PEG. RD will add oral nutrition supplements to promote adequate oral intake.    Medications reviewed and include keppra.   Labs reviewed: K: 3.0 (on IV supplementation) CBGS: 107-122 (inpatient orders for glycemic control are none).    Diet Order:   Diet Order              Diet NPO time specified  Diet effective now                   EDUCATION NEEDS:   Not appropriate for education at this time  Skin:  Skin Assessment: Skin Integrity Issues: Skin Integrity Issues:: Other (Comment) Other: pressure injury to coccyx (no stage noted)  Last BM:  04/27/21  Height:   Ht Readings from Last 1 Encounters:  04/22/21 6' (1.829 m)    Weight:   Wt Readings from Last 1 Encounters:  04/28/21 69 kg   BMI:  Body mass index is 20.63 kg/m.  Estimated Nutritional Needs:   Kcal:  1600-1800  Protein:  90-110 grams  Fluid:  1.6-1.8 L    Loistine Chance, RD, LDN, [place] Registered Dietitian II Certified Diabetes Care and Education Specialist Please refer to [facility] for RD and/or RD on-call/weekend/after hours pager

## 2021-04-28 NOTE — Progress Notes (Signed)
[name], [name] ([id number]) Visit Report for 04/22/2021 Arrival Information Details Patient Name: Date of Service: [name], [name] 04/22/2021 2:30 PM Medical Record Number: [id number] Patient Account Number: [id number] Date of Birth/Sex: Treating RN: 1922-02-28 (87 y.o. [name] Primary Care [name]: PA Haig Prophet, NO Other Clinician: Referring [name]: Treating [name]/Extender: [name] in Treatment: 79 Visit Information History Since Last Visit Added or deleted any medications: No Patient Arrived: Wheel Chair Any new allergies or adverse reactions: No Arrival Time: 14:45 Had a fall or experienced change in No Accompanied By: daughter activities of daily living that may affect Transfer Assistance: [name] Lift risk of falls: Patient Identification Verified: Yes Signs or symptoms of abuse/neglect since last visito No Secondary Verification Process Completed: Yes Hospitalized since last visit: No Patient Requires Transmission-Based Precautions: No Implantable device outside of the clinic excluding No Patient Has Alerts: No cellular tissue based products placed in the center since last visit: Has Dressing in Place as Prescribed: Yes Pain Present Now: No Notes daughter reports blisters to left hand 3rd digit, left arm and left thigh. Electronic Signature(s) Signed: 04/22/2021 5:58:14 PM By: [name] Pilling RN, BSN Entered By: [name] on 04/22/2021 14:58:13 -------------------------------------------------------------------------------- Clinic Level of Care Assessment Details Patient Name: Date of Service: [name], [name] 04/22/2021 2:30 PM Medical Record Number: [id number] Patient Account Number: [id number] Date of Birth/Sex: Treating RN: 19-Oct-1921 (87 y.o. [name] Primary Care [name]: PA Haig Prophet, NO Other Clinician: Referring [name]: Treating [name]/Extender: [name] in Treatment: 79 Clinic Level of  Care Assessment Items TOOL 4 Quantity Score X- 1 0 Use when only an EandM is performed on FOLLOW-UP visit ASSESSMENTS - Nursing Assessment / Reassessment []  - 0 Reassessment of Co-morbidities (includes updates in patient status) []  - 0 Reassessment of Adherence to Treatment Plan ASSESSMENTS - Wound and Skin A ssessment / Reassessment []  - 0 Simple Wound Assessment / Reassessment - one wound X- 1 5 Complex Wound Assessment / Reassessment - multiple wounds X- 1 10 Dermatologic / Skin Assessment (not related to wound area) ASSESSMENTS - Focused Assessment []  - 0 Circumferential Edema Measurements - multi extremities []  - 0 Nutritional Assessment / Counseling / Intervention []  - 0 Lower Extremity Assessment (monofilament, tuning fork, pulses) []  - 0 Peripheral Arterial Disease Assessment (using hand held doppler) ASSESSMENTS - Ostomy and/or Continence Assessment and Care []  - 0 Incontinence Assessment and Management []  - 0 Ostomy Care Assessment and Management (repouching, etc.) PROCESS - Coordination of Care []  - 0 Simple Patient / Family Education for ongoing care X- 1 20 Complex (extensive) Patient / Family Education for ongoing care X- 1 10 Staff obtains Programmer, systems, Records, T Results / Process Orders est []  - 0 Staff telephones HHA, Nursing Homes / Clarify orders / etc []  - 0 Routine Transfer to another Facility (non-emergent condition) []  - 0 Routine Hospital Admission (non-emergent condition) []  - 0 New Admissions / Biomedical engineer / Ordering NPWT Apligraf, etc. , X- 1 20 Emergency Hospital Admission (emergent condition) []  - 0 Simple Discharge Coordination X- 1 15 Complex (extensive) Discharge Coordination PROCESS - Special Needs []  - 0 Pediatric / Minor Patient Management []  - 0 Isolation Patient Management []  - 0 Hearing / Language / Visual special needs []  - 0 Assessment of Community assistance (transportation, D/C planning, etc.) []  -  0 Additional assistance / Altered mentation []  - 0 Support Surface(s) Assessment (bed, cushion, seat, etc.) INTERVENTIONS - Wound Cleansing / Measurement []  - 0 Simple  Wound Cleansing - one wound X- 5 5 Complex Wound Cleansing - multiple wounds X- 1 5 Wound Imaging (photographs - any number of wounds) []  - 0 Wound Tracing (instead of photographs) []  - 0 Simple Wound Measurement - one wound X- 5 5 Complex Wound Measurement - multiple wounds INTERVENTIONS - Wound Dressings []  - 0 Small Wound Dressing one or multiple wounds []  - 0 Medium Wound Dressing one or multiple wounds []  - 0 Large Wound Dressing one or multiple wounds []  - 0 Application of Medications - topical []  - 0 Application of Medications - injection INTERVENTIONS - Miscellaneous []  - 0 External ear exam []  - 0 Specimen Collection (cultures, biopsies, blood, body fluids, etc.) []  - 0 Specimen(s) / Culture(s) sent or taken to Lab for analysis []  - 0 Patient Transfer (multiple staff / Civil Service fast streamer / Similar devices) []  - 0 Simple Staple / Suture removal (25 or less) []  - 0 Complex Staple / Suture removal (26 or more) []  - 0 Hypo / Hyperglycemic Management (close monitor of Blood Glucose) []  - 0 Ankle / Brachial Index (ABI) - do not check if billed separately X- 1 5 Vital Signs Has the patient been seen at the hospital within the last three years: Yes Total Score: 140 Level Of Care: New/Established - Level 4 Electronic Signature(s) Signed: 04/22/2021 5:58:14 PM By: [name] Pilling RN, BSN Entered By: [name] on 04/22/2021 16:04:27 -------------------------------------------------------------------------------- Encounter Discharge Information Details Patient Name: Date of Service: [name] [facility] [name], [name] 04/22/2021 2:30 PM Medical Record Number: [id number] Patient Account Number: [id number] Date of Birth/Sex: Treating RN: 1922/07/10 (87 y.o. [name] Primary Care [name]: PA Haig Prophet, NO Other  Clinician: Referring Falisa [name]: Treating [name]/Extender: [name] in Treatment: 79 Encounter Discharge Information Items Discharge Condition: Unstable Ambulatory Status: Stretcher Discharge Destination: Emergency Room Telephoned: Yes Spoke With: 911, EMS transported patient to ED. Orders Sent: Yes Transportation: Ambulance Accompanied By: daughter Schedule Follow-up Appointment: No Clinical Summary of Care: Notes blistered areas were covered with gauze and tape. Electronic Signature(s) Signed: 04/22/2021 5:58:14 PM By: [name] Pilling RN, BSN Entered By: [name] on 04/22/2021 16:03:10 -------------------------------------------------------------------------------- General Visit Notes Details Patient Name: Date of Service: [facility] [name], [name] 04/22/2021 2:30 PM Medical Record Number: [id number] Patient Account Number: [id number] Date of Birth/Sex: Treating RN: July 24, 1922 (87 y.o. [name] Primary Care Siboney Requejo: PA Haig Prophet, NO Other Clinician: Referring Toiya Morrish: Treating Akane Tessier/Extender: [name] in Treatment: 79 Notes 1507- Patient started moving right arm and leg uncontrollably with a fixed gaze to right side. Upon observations patient seem to be having a seizure. Obtain MD for evaluation, checked 02 sats 80%, agonal breaths, HR 130. Turned patient onto left side related to patient spited up small amount of food. Per MD to apply oxygen to patient. Non-breather mask applied with portal 02 tank to patient. 1509- Called 911, EMS in route. 1510- Seizure/involuntary movement stopped. Patient now inpostictal phase. Patient respiratory Rate increases to 22 with even breath sounds upon auscultation 02 sats increase 92%. 1515- Patient remains on left side with equal even breath sounds at rate 22. Patient remains postictal. BP 121/56, heart rate 94, 22 RR, 178 blood glucose, 99% 02 sats with non-breather 100% 02. 1518-EMS  arrived, update EMS situation with patient, daughter in room providing history as well. EMS obtains an EKG, patient is noting to have another seizure, EMS administers Versed IM, assist EMS with transfer of patient from stretcher to EMS stretcher, patient transported to [facility]. Electronic Signature(s) Signed: 04/22/2021 5:58:14 PM By: [name] Pilling RN, BSN Entered By: [name] on 04/22/2021 16:02:22 -------------------------------------------------------------------------------- Lower Extremity Assessment Details Patient Name: Date of Service: [facility] [name], [name] 04/22/2021 2:30 PM Medical Record Number: [id number] Patient Account Number: [id number] Date of Birth/Sex: Treating RN: [birth year] (87 y.o. [name] Primary Care Adolphe Fortunato: PA Haig Prophet, NO Other Clinician: Referring Annalucia Laino: Treating Marybel Alcott/Extender: [name] in Treatment: 22 Electronic Signature(s) Signed: 04/22/2021 5:58:14 PM By: [name] Pilling RN, BSN Entered By: [name] on 04/22/2021 15:05:35 -------------------------------------------------------------------------------- Multi Wound Chart Details Patient Name: Date of Service: [facility] [name], [name] 04/22/2021 2:30 PM Medical Record Number: [id number] Patient Account Number: [id number] Date of Birth/Sex: Treating RN: 08/13/1921 (87 y.o. [name] Primary Care Tranell Wojtkiewicz: PA TIENT, NO Other Clinician: Referring Patsey Pitstick: Treating Alyssamae Klinck/Extender: [name] in Treatment: 79 Vital Signs Height(in): 71 Pulse(bpm): 108 Weight(lbs): 175 Blood Pressure(mmHg): 121/64 Body Mass Index(BMI): 24 Temperature(F): 99 Respiratory Rate(breaths/min): 20 Photos: [10:No Photos] Left, Proximal Upper Arm Left, Medial Upper Leg Right, Medial Upper Leg Wound Location: Blister Blister Blister Wounding Event: Atypical Atypical Atypical Primary Etiology: Glaucoma, Chronic Obstructive Glaucoma, Chronic  Obstructive Glaucoma, Chronic Obstructive Comorbid History: Pulmonary Disease (COPD), Coronary Pulmonary Disease (COPD), Coronary Pulmonary Disease (COPD), Coronary Artery Disease, Hypertension, Type II Artery Disease, Hypertension, Type II Artery Disease, Hypertension, Type II Diabetes, Osteoarthritis, Dementia Diabetes, Osteoarthritis, Dementia Diabetes, Osteoarthritis, Dementia 04/16/2021 04/16/2021 04/16/2021 Date Acquired: 0 0 0 Weeks of Treatment: Open Open Open Wound Status: Yes Yes No Clustered Wound: 2 2 N/A Clustered Quantity: 2x2.2x0.1 2x2.7x0.1 2.5x3.2x0.1 Measurements L x W x D (cm) 3.456 4.241 6.283 A (cm) : rea 0.346 0.424 0.628 Volume (cm) : 0.00% 0.00% 0.00% % Reduction in Area: 0.00% 0.00% 0.00% % Reduction in Volume: Full Thickness Without Exposed Full Thickness Without Exposed Full Thickness Without Exposed Classification: Support Structures Support Structures Support Structures Medium Medium Medium Exudate Amount: Serosanguineous Serosanguineous Serosanguineous Exudate Type: red, brown red, brown red, brown Exudate Color: Distinct, outline attached Distinct, outline attached Distinct, outline attached Wound Margin: Large (67-100%) Large (67-100%) Large (67-100%) Granulation Amount: Pink, Pale Pink, Pale Pink Granulation Quality: None Present (0%) None Present (0%) None Present (0%) Necrotic Amount: Fat Layer (Subcutaneous Tissue): Yes Fat Layer (Subcutaneous Tissue): Yes Fat Layer (Subcutaneous Tissue): Yes Exposed Structures: Fascia: No Fascia: No Fascia: No Tendon: No Tendon: No Tendon: No Muscle: No Muscle: No Muscle: No Joint: No Joint: No Joint: No Bone: No Bone: No Bone: No None None None Epithelialization: Wound Number: 8 9 N/A Photos: N/A Left, Anterior Upper Leg Left, Distal Upper Arm N/A Wound Location: Blister Blister N/A Wounding Event: Atypical Atypical N/A Primary Etiology: Glaucoma, Chronic Obstructive Glaucoma,  Chronic Obstructive N/A Comorbid History: Pulmonary Disease (COPD), Coronary Pulmonary Disease (COPD), Coronary Artery Disease, Hypertension, Type II Artery Disease, Hypertension, Type II Diabetes, Osteoarthritis, Dementia Diabetes, Osteoarthritis, Dementia 04/16/2021 04/16/2021 N/A Date Acquired: 0 0 N/A Weeks of Treatment: Open Open N/A Wound Status: No Yes N/A Clustered Wound: N/A 2 N/A Clustered Quantity: 4.5x3.5x0.1 4.5x1.8x0.1 N/A Measurements L x W x D (cm) 12.37 6.362 N/A A (cm) : rea 1.237 0.636 N/A Volume (cm) : 0.00% 0.00% N/A % Reduction in Area: 0.00% 0.00% N/A % Reduction in Volume: Full Thickness Without Exposed Full Thickness Without Exposed N/A Classification: Support Structures Support Structures Medium Small N/A Exudate Amount: Serosanguineous Serosanguineous N/A Exudate Type: red, brown red, brown N/A Exudate Color: Distinct, outline attached Distinct, outline attached N/A Wound Margin: Large (67-100%) Large (  67-100%) N/A Granulation Amount: Pink Pink N/A Granulation Quality: None Present (0%) None Present (0%) N/A Necrotic Amount: Fat Layer (Subcutaneous Tissue): Yes Fat Layer (Subcutaneous Tissue): Yes N/A Exposed Structures: Fascia: No Fascia: No Tendon: No Tendon: No Muscle: No Muscle: No Joint: No Joint: No Bone: No Bone: No None None N/A Epithelialization: Treatment Notes Electronic Signature(s) Signed: 04/22/2021 4:56:16 PM By: Linton Ham MD Signed: 04/22/2021 5:56:57 PM By: Levan Hurst RN, BSN Entered By: Linton Ham on 04/22/2021 15:21:18 -------------------------------------------------------------------------------- Pain Assessment Details Patient Name: Date of Service: [facility] [name], [name] 04/22/2021 2:30 PM Medical Record Number: [id number] Patient Account Number: [id number] Date of Birth/Sex: Treating RN: [birth year] (87 y.o. [name] Primary Care Itzayana Pardy: PA Haig Prophet, NO Other Clinician: Referring  Kaly Mcquary: Treating Vernel Donlan/Extender: [name] in Treatment: 79 Active Problems Location of Pain Severity and Description of Pain Patient Has Paino No Site Locations Rate the pain. Current Pain Level: 0 Pain Management and Medication Current Pain Management: Medication: No Cold Application: No Rest: No Massage: No Activity: No T.E.N.S.: No Heat Application: No Leg drop or elevation: No Is the Current Pain Management Adequate: Adequate How does your wound impact your activities of daily livingo Sleep: No Bathing: No Appetite: No Relationship With Others: No Bladder Continence: No Emotions: No Bowel Continence: No Work: No Toileting: No Drive: No Dressing: No Hobbies: No Electronic Signature(s) Signed: 04/22/2021 5:58:14 PM By: [name] Pilling RN, BSN Entered By: [name] on 04/22/2021 15:05:31 -------------------------------------------------------------------------------- Wound Assessment Details Patient Name: Date of Service: [facility] [name], [name] 04/22/2021 2:30 PM Medical Record Number: [id number] Patient Account Number: [id number] Date of Birth/Sex: Treating RN: [birth year] (87 y.o. [name] Primary Care Aubra Pappalardo: PA TIENT, NO Other Clinician: Referring Roniqua Kintz: Treating Loanne Emery/Extender: [name] in Treatment: 79 Wound Status Wound Number: 10 Primary Atypical Etiology: Wound Location: Left, Proximal Upper Arm Wound Open Wounding Event: Blister Status: Date Acquired: 04/16/2021 Comorbid Glaucoma, Chronic Obstructive Pulmonary Disease (COPD), Weeks Of Treatment: 0 History: Coronary Artery Disease, Hypertension, Type II Diabetes, Clustered Wound: Yes Osteoarthritis, Dementia Wound Measurements Length: (cm) 2 Width: (cm) 2.2 Depth: (cm) 0.1 Clustered Quantity: 2 Area: (cm) 3.456 Volume: (cm) 0.346 % Reduction in Area: 0% % Reduction in Volume: 0% Epithelialization: None Tunneling:  No Undermining: No Wound Description Classification: Full Thickness Without Exposed Support Structures Wound Margin: Distinct, outline attached Exudate Amount: Medium Exudate Type: Serosanguineous Exudate Color: red, brown Foul Odor After Cleansing: No Slough/Fibrino No Wound Bed Granulation Amount: Large (67-100%) Exposed Structure Granulation Quality: Pink, Pale Fascia Exposed: No Necrotic Amount: None Present (0%) Fat Layer (Subcutaneous Tissue) Exposed: Yes Tendon Exposed: No Muscle Exposed: No Joint Exposed: No Bone Exposed: No Electronic Signature(s) Signed: 04/22/2021 5:58:14 PM By: [name] Pilling RN, BSN Signed: 04/28/2021 12:59:59 PM By: Sandre Kitty Entered By: Sandre Kitty on 04/22/2021 15:05:56 -------------------------------------------------------------------------------- Wound Assessment Details Patient Name: Date of Service: [facility] [name], [name] 04/22/2021 2:30 PM Medical Record Number: [id number] Patient Account Number: [id number] Date of Birth/Sex: Treating RN: Jul 16, 1922 (87 y.o. [name] Primary Care Electra Paladino: PA Haig Prophet, NO Other Clinician: Referring Tamyra Fojtik: Treating Chaitanya Amedee/Extender: [name] in Treatment: 79 Wound Status Wound Number: 6 Primary Atypical Etiology: Wound Location: Left, Medial Upper Leg Wound Open Wounding Event: Blister Status: Date Acquired: 04/16/2021 Comorbid Glaucoma, Chronic Obstructive Pulmonary Disease (COPD), Weeks Of Treatment: 0 History: Coronary Artery Disease, Hypertension, Type II Diabetes, Clustered Wound: Yes Osteoarthritis, Dementia Photos Wound Measurements Length: (cm) 2 Width: (cm) 2.7 Depth: (cm) 0.1  Clustered Quantity: 2 Area: (cm) 4.241 Volume: (cm) 0.424 % Reduction in Area: 0% % Reduction in Volume: 0% Epithelialization: None Tunneling: No Undermining: No Wound Description Classification: Full Thickness Without Exposed Support Structures Wound Margin:  Distinct, outline attached Exudate Amount: Medium Exudate Type: Serosanguineous Exudate Color: red, brown Foul Odor After Cleansing: No Slough/Fibrino No Wound Bed Granulation Amount: Large (67-100%) Exposed Structure Granulation Quality: Pink, Pale Fascia Exposed: No Necrotic Amount: None Present (0%) Fat Layer (Subcutaneous Tissue) Exposed: Yes Tendon Exposed: No Muscle Exposed: No Joint Exposed: No Bone Exposed: No Electronic Signature(s) Signed: 04/22/2021 5:58:14 PM By: [name] Pilling RN, BSN Signed: 04/28/2021 12:59:59 PM By: Sandre Kitty Entered By: Sandre Kitty on 04/22/2021 15:02:23 -------------------------------------------------------------------------------- Wound Assessment Details Patient Name: Date of Service: [facility] [name], [name] 04/22/2021 2:30 PM Medical Record Number: [id number] Patient Account Number: [id number] Date of Birth/Sex: Treating RN: 08-17-1921 (87 y.o. [name] Primary Care Niels Cranshaw: PA TIENT, NO Other Clinician: Referring Tylyn Stankovich: Treating Danella Philson/Extender: [name] in Treatment: 79 Wound Status Wound Number: 7 Primary Atypical Etiology: Wound Location: Right, Medial Upper Leg Wound Open Wounding Event: Blister Status: Date Acquired: 04/16/2021 Comorbid Glaucoma, Chronic Obstructive Pulmonary Disease (COPD), Weeks Of Treatment: 0 History: Coronary Artery Disease, Hypertension, Type II Diabetes, Clustered Wound: No Osteoarthritis, Dementia Photos Wound Measurements Length: (cm) 2.5 Width: (cm) 3.2 Depth: (cm) 0.1 Area: (cm) 6.283 Volume: (cm) 0.628 % Reduction in Area: 0% % Reduction in Volume: 0% Epithelialization: None Tunneling: No Undermining: No Wound Description Classification: Full Thickness Without Exposed Support Structures Wound Margin: Distinct, outline attached Exudate Amount: Medium Exudate Type: Serosanguineous Exudate Color: red, brown Foul Odor After Cleansing:  No Slough/Fibrino No Wound Bed Granulation Amount: Large (67-100%) Exposed Structure Granulation Quality: Pink Fascia Exposed: No Necrotic Amount: None Present (0%) Fat Layer (Subcutaneous Tissue) Exposed: Yes Tendon Exposed: No Muscle Exposed: No Joint Exposed: No Bone Exposed: No Electronic Signature(s) Signed: 04/22/2021 5:58:14 PM By: [name] Pilling RN, BSN Signed: 04/28/2021 12:59:59 PM By: Sandre Kitty Entered By: Sandre Kitty on 04/22/2021 15:03:18 -------------------------------------------------------------------------------- Wound Assessment Details Patient Name: Date of Service: [facility] [name], [name] 04/22/2021 2:30 PM Medical Record Number: [id number] Patient Account Number: [id number] Date of Birth/Sex: Treating RN: 04/25/1922 (87 y.o. [name] Primary Care Hasset Chaviano: PA TIENT, NO Other Clinician: Referring Dontrelle Mazon: Treating Jowanda Heeg/Extender: [name] in Treatment: 79 Wound Status Wound Number: 8 Primary Atypical Etiology: Wound Location: Left, Anterior Upper Leg Wound Open Wounding Event: Blister Status: Date Acquired: 04/16/2021 Comorbid Glaucoma, Chronic Obstructive Pulmonary Disease (COPD), Weeks Of Treatment: 0 History: Coronary Artery Disease, Hypertension, Type II Diabetes, Clustered Wound: No Osteoarthritis, Dementia Photos Wound Measurements Length: (cm) 4.5 Width: (cm) 3.5 Depth: (cm) 0.1 Area: (cm) 12.37 Volume: (cm) 1.237 % Reduction in Area: 0% % Reduction in Volume: 0% Epithelialization: None Tunneling: No Undermining: No Wound Description Classification: Full Thickness Without Exposed Support Structures Wound Margin: Distinct, outline attached Exudate Amount: Medium Exudate Type: Serosanguineous Exudate Color: red, brown Foul Odor After Cleansing: No Slough/Fibrino No Wound Bed Granulation Amount: Large (67-100%) Exposed Structure Granulation Quality: Pink Fascia Exposed: No Necrotic  Amount: None Present (0%) Fat Layer (Subcutaneous Tissue) Exposed: Yes Tendon Exposed: No Muscle Exposed: No Joint Exposed: No Bone Exposed: No Electronic Signature(s) Signed: 04/22/2021 5:58:14 PM By: [name] Pilling RN, BSN Signed: 04/28/2021 12:59:59 PM By: Sandre Kitty Entered By: Sandre Kitty on 04/22/2021 15:04:06 -------------------------------------------------------------------------------- Wound Assessment Details Patient Name: Date of Service: [facility] [name], [name] 04/22/2021 2:30 PM Medical Record Number: [id number]  Patient Account Number: [id number] Date of Birth/Sex: Treating RN: Jan 15, 1922 (87 y.o. [name] Primary Care Momo Braun: PA TIENT, NO Other Clinician: Referring Zyona Pettaway: Treating Herta Hink/Extender: [name] in Treatment: 79 Wound Status Wound Number: 9 Primary Atypical Etiology: Wound Location: Left, Distal Upper Arm Wound Open Wounding Event: Blister Status: Date Acquired: 04/16/2021 Comorbid Glaucoma, Chronic Obstructive Pulmonary Disease (COPD), Weeks Of Treatment: 0 History: Coronary Artery Disease, Hypertension, Type II Diabetes, Clustered Wound: Yes Osteoarthritis, Dementia Photos Wound Measurements Length: (cm) 4.5 Width: (cm) 1.8 Depth: (cm) 0.1 Clustered Quantity: 2 Area: (cm) 6.362 Volume: (cm) 0.636 % Reduction in Area: 0% % Reduction in Volume: 0% Epithelialization: None Tunneling: No Undermining: No Wound Description Classification: Full Thickness Without Exposed Support Structures Wound Margin: Distinct, outline attached Exudate Amount: Small Exudate Type: Serosanguineous Exudate Color: red, brown Foul Odor After Cleansing: No Slough/Fibrino No Wound Bed Granulation Amount: Large (67-100%) Exposed Structure Granulation Quality: Pink Fascia Exposed: No Necrotic Amount: None Present (0%) Fat Layer (Subcutaneous Tissue) Exposed: Yes Tendon Exposed: No Muscle Exposed: No Joint Exposed:  No Bone Exposed: No Electronic Signature(s) Signed: 04/22/2021 5:58:14 PM By: [name] Pilling RN, BSN Signed: 04/28/2021 12:59:59 PM By: Sandre Kitty Entered By: Sandre Kitty on 04/22/2021 15:04:57 -------------------------------------------------------------------------------- Vitals Details Patient Name: Date of Service: [facility] [name], [name] 04/22/2021 2:30 PM Medical Record Number: [id number] Patient Account Number: [id number] Date of Birth/Sex: Treating RN: 1922/06/03 (87 y.o. [name] Primary Care Tsugio Elison: PA TIENT, NO Other Clinician: Referring Kylii Ennis: Treating Murry Khiev/Extender: [name] in Treatment: 79 Vital Signs Time Taken: 14:40 Temperature (F): 99 Height (in): 71 Pulse (bpm): 108 Weight (lbs): 175 Respiratory Rate (breaths/min): 20 Body Mass Index (BMI): 24.4 Blood Pressure (mmHg): 121/64 Reference Range: 80 - 120 mg / dl Electronic Signature(s) Signed: 04/22/2021 5:58:14 PM By: [name] Pilling RN, BSN Entered By: [name] on 04/22/2021 15:05:01

## 2021-04-28 NOTE — Progress Notes (Signed)
End of shift: Pt turns to voice, able to understand some words, pt opens mouth with feeding, no coughing or holding of food noted during feeding, meds crushed in applesauce, dysphagia 1 (puree) meals, generalized edema noted +2 bue/ble noted, primo fit in use for urine, incontinent of bowel/bladder, pt unable to use call bell, total care, turn every 2 hours.

## 2021-04-28 NOTE — Progress Notes (Signed)
Speech Language Pathology Treatment: Dysphagia  Patient Details Name: [name] MRN: [id number] DOB: [birth year] Today's Date: 04/28/2021 Time: 5188-4166 SLP Time Calculation (min) (ACUTE ONLY): 25 min  Assessment / Plan / Recommendation Clinical Impression  SLP followed up for assessment of POs as pt reportedly had overt coughing with POs (yesterday pm, made NPO with concerns of choking). Pts daughter, [name], at bedside. Pt did have a baseline cough prior to PO trials with SLP this date, being treated for PNA currently. Pt alert, eager for POs as evidenced by mouth opening posture toward PO trials. Following oral care, SLP administered ice chips, sips of water by cup and straw. Swallow initiation appeared delayed per palpation. Pt with intermittent humming (no wetness in vocal quality appreciated). Delayed throat clear noted with thins x1. Puree and nectar thick liquids were without overt s/sx of aspiration. Recommend resume dysphagia 1 (puree) and nectar thick liquids with meds crushed in puree and full supervision with all PO. SLP to closely monitor for goals of care, diet tolerance, and or appropriateness for instrumental assessment if clinically appropriate and within goals of care.   HPI HPI: This 87 y.o. African American male  presented to the [facility] Emergency Department via EMS on 9/22 with complaints of new onset seizure disorder.  The patient was intubated in the ER for airway protection. Extubated on 9/23. PMH: CAD with DES 2014, HTN, HLD, Dementia, Stroke, DMII, stage 4 PI, chronic sacral ulcer getting wound care      SLP Plan  Continue with current plan of care      Recommendations for follow up therapy are one component of a multi-disciplinary discharge planning process, led by the attending physician.  Recommendations may be updated based on patient status, additional functional criteria and insurance authorization.    Recommendations  Diet  recommendations: Dysphagia 1 (puree);Nectar-thick liquid Liquids provided via: Cup;Straw Medication Administration: Crushed with puree Compensations: Slow rate;Small sips/bites;Minimize environmental distractions Postural Changes and/or Swallow Maneuvers: Seated upright 90 degrees;Upright 30-60 min after meal                Oral Care Recommendations: Staff/trained caregiver to provide oral care;Oral care BID Follow up Recommendations: Other (comment) (TBD) SLP Visit Diagnosis: Dysphagia, unspecified (R13.10) Plan: Continue with current plan of care       GO               Hayden Rasmussen MA, CCC-SLP Acute Rehabilitation Services    04/28/2021, 1:22 PM

## 2021-04-29 LAB — BASIC METABOLIC PANEL
Anion gap: 4 — ABNORMAL LOW (ref 5–15)
BUN: 7 mg/dL — ABNORMAL LOW (ref 8–23)
CO2: 26 mmol/L (ref 22–32)
Calcium: 8.7 mg/dL — ABNORMAL LOW (ref 8.9–10.3)
Chloride: 109 mmol/L (ref 98–111)
Creatinine, Ser: 1.01 mg/dL (ref 0.61–1.24)
GFR, Estimated: >60 mL/min (ref >60)
Glucose, Bld: 162 mg/dL — ABNORMAL HIGH (ref 70–99)
Potassium: 3.6 mmol/L (ref 3.5–5.1)
Sodium: 139 mmol/L (ref 135–145)

## 2021-04-29 LAB — CBC
HCT: 23.3 % — ABNORMAL LOW (ref 39.0–52.0)
Hemoglobin: 7.1 g/dL — ABNORMAL LOW (ref 13.0–17.0)
MCH: 29 pg (ref 26.0–34.0)
MCHC: 30.5 g/dL (ref 30.0–36.0)
MCV: 95.1 fL (ref 80.0–100.0)
Platelets: 241 10*3/uL (ref 150–400)
RBC: 2.45 MIL/uL — ABNORMAL LOW (ref 4.22–5.81)
RDW: 14.6 % (ref 11.5–15.5)
WBC: 6.5 10*3/uL (ref 4.0–10.5)
nRBC: 0 % (ref 0.0–0.2)

## 2021-04-29 LAB — MAGNESIUM: Magnesium: 1.7 mg/dL (ref 1.7–2.4)

## 2021-04-29 LAB — GLUCOSE, CAPILLARY
Glucose-Capillary: 137 mg/dL — ABNORMAL HIGH (ref 70–99)
Glucose-Capillary: 153 mg/dL — ABNORMAL HIGH (ref 70–99)
Glucose-Capillary: 184 mg/dL — ABNORMAL HIGH (ref 70–99)
Glucose-Capillary: 180 mg/dL — ABNORMAL HIGH (ref 70–99)
Glucose-Capillary: 157 mg/dL — ABNORMAL HIGH (ref 70–99)
Glucose-Capillary: 153 mg/dL — ABNORMAL HIGH (ref 70–99)

## 2021-04-29 MED ORDER — ASPIRIN 81 MG PO CHEW
81.0000 mg | CHEWABLE_TABLET | Freq: Every day | ORAL | Status: DC
  Administered 2021-04-29 – 2021-05-02 (×4): 81 mg via ORAL
  Filled 2021-04-29 (×4): qty 1

## 2021-04-29 MED ORDER — POTASSIUM CHLORIDE 20 MEQ PO PACK
40.0000 meq | PACK | Freq: Once | ORAL | Status: AC
  Administered 2021-04-29: 40 meq via ORAL
  Filled 2021-04-29: qty 2

## 2021-04-29 MED ORDER — MAGNESIUM SULFATE 2 GM/50ML IV SOLN
2.0000 g | Freq: Once | INTRAVENOUS | Status: AC
  Administered 2021-04-29: 2 g via INTRAVENOUS
  Filled 2021-04-29: qty 50

## 2021-04-29 MED ORDER — POTASSIUM CHLORIDE CRYS ER 20 MEQ PO TBCR: 40.0000 meq | EXTENDED_RELEASE_TABLET | Freq: Once | ORAL | Status: DC

## 2021-04-29 NOTE — Progress Notes (Signed)
[facility] Progress Note  Patient: [name]    BOF:[id number]  DOA: 04/22/2021     Date of Service: the patient was seen and examined on 04/29/2021  Brief hospital course:  Past medical history of CAD with PCI, HTN, HLD, dementia, CVA, type II DM, stage IV pressure ulcer-presented with seizures/status epilepticus-intubated for airway protection on presentation.  Extubated on 9/23-and transferred to [facility] on 9/24.  Significant events: 9/21>> admit to ICU-intubated 9/21>> lumbar puncture 9/21-9/22>> LTM EEG: No seizures 9/22>> punch biopsy left thigh 9/23>> extubated 9/24>> transfer to [facility]  Subjective:  Sitting up in bed-daughter feeding him breakfast.  Objective: Vitals: Blood pressure (!) 130/41, pulse 84, temperature 98.6 F (37 C), temperature source Axillary, resp. rate 20, height 6' (1.829 m), weight 69 kg, SpO2 98 %.   Exam: Gen Exam: Not in any distress HEENT:atraumatic, normocephalic Chest: B/L clear to auscultation anteriorly CVS:S1S2 regular Abdomen:soft non tender, non distended Extremities:no edema Neurology: Chronic right sided deficits. Skin: no rash   Pertinent labs: Hb: 7.1 Mg:1.7  Assessment and Plan: Acute hypoxic respiratory failure due to status epilepticus/aspiration pneumonia: Intubated on initial presentation in the emergency room for airway protection-currently on 2 L of oxygen via nasal cannula.  Remains on IV Unasyn until 9/29.  Status epilepticus-new onset focal seizure: Resolved-continue reduced dose Keppra 50 mg twice daily.  No further recommendations from neurology-needs outpatient neurology follow-up on discharge.  Acute metabolic encephalopathy: Slowly improving-following simple commands-daughter able to feed patient breakfast.  MRI brain without any acute abnormalities.  CSF with negative cultures-HSV PCR negative.  Arbovirus IgG CSF pending.  Continue supportive care.  Dysphagia: Probably some amount of chronic dysphagia at  baseline-worsened by acute illness-SLP following-currently on a dysphagia 1 diet.  Will await further recommendations.  Bollous skin lesion: Awaiting skin biopsy-all lesions have deroofed spontaneously.  COPD: Stable-continue bronchodilators  Normocytic anemia: Appears to have chronic normocytic anemia at baseline-worsened due to acute illness.  No evidence of blood loss.  Follow and transfuse if less than 7.    HTN: BP controlled-on IV Lopressor-we will attempt to transition back to oral medications over the next few days.  History of CVA with chronic right-sided hemiparesis: Resume aspirin  History of CAD s/p PCI 2014: Resume aspirin  Chronically bedridden  Goals of care discussion: Frail elderly-chronically bedridden with right-sided weakness-presenting with status epilepticus-now with severe dysphagia.  Started on dysphagia 1 diet which she seems to be tolerating.  Palliative care following-goals of care discussion in progress-we will await further recommendations from palliative care.  Overall long-term prognosis is obviously poor.  Nutrition Status: Nutrition Problem: Moderate Malnutrition Etiology: chronic illness (dementia) Signs/Symptoms: moderate fat depletion, severe muscle depletion Interventions: Nepro shake, Magic cup, MVI   Pressure Ulcer: Pressure Injury 07/12/19 Coccyx (Active)  07/12/19 2100  Location: Coccyx  Location Orientation:   Staging:   Wound Description (Comments):   Present on Admission:     DVT Prophylaxis: SQ heparin CODE STATUS: Full code Family communication: Daughter at bedside heparin injection 5,000 Units Start: 04/23/21 0600 SCDs Start: 04/22/21 2204  Disposition:  Status is: Inpatient  Remains inpatient appropriate because:IV treatments appropriate due to intensity of illness or inability to take PO  Dispo: The patient is from: Home              Anticipated d/c is to: SNF              Patient currently is not medically stable to  d/c.   Difficult to place patient  No     Time spent: 35 minutes. I reviewed all nursing notes, pharmacy notes, vitals, pertinent old records. I have discussed plan of care as described above with RN.  Oren Binet, MD [facility] 04/29/2021 11:40 AM  To reach On-call, see care teams to locate the attending and reach out via [email or web address]. Between 7PM-7AM, please contact night-coverage If you still have difficulty reaching the attending provider, please page the [facility] (Director on Call) for [facility] on amion for assistance.

## 2021-04-29 NOTE — Progress Notes (Signed)
Patient ID: [name], male   DOB: 11-30-1921, 87 y.o.   MRN: [id number]    Progress Note from the Palliative Medicine Team at [facility]   Patient Name: [name]        Date: 04/29/2021 DOB: 1921/11/23  Age: 87 y.o. MRN#: [id number] Attending Physician: Jonetta Osgood, MD Primary Care Physician: Patient, No Pcp Per (Inactive) Admit Date: 04/22/2021   Medical records reviewed   Past medical history of CAD with PCI, HTN, HLD, dementia, CVA, type II DM, stage IV pressure ulcer-presented with seizures/status epilepticus-intubated for airway protection on presentation.  Extubated on 04-24-21.  Continued high risk for decompensation.  Patient lethargic, following some simple commands.  Patient is unable to make his own healthcare decisions.    Family face treatment option decisions, advanced directive decisions and anticipatory care needs.  This NP visited patient at the bedside as a follow up for palliative medicine needs and emotional support.   Daughter/ [name] at bedside continued conversation regarding current medical situation; diagnosis, prognosis, goals of care, end-of-life wishes, disposition and options.  Education offered on concept specific to adult failure to thrive secondary to multiple comorbidities and the limitations of medical interventions to prolong quality of life when the body does fail to thrive.  Education offered on patient's current full CODE STATUS and encouraged family to consider DNR/DNI status understanding evidenced based poor outcomes in similar hospitalized patient, as the cause of arrest is likely associated with advanced chronic illness rather than an easily reversible acute cardio-pulmonary event.   Education offered specifically to concepts regarding artificial feeding and hydration and associated risks and benefits.   Daughter clearly verbalizes that she wants to avoid feeding tube and believes that her father can successfully take oral intake.   Education offered on concepts of dysphagia and associated aspiration risks.  Education offered on comfort feeds, initiation of comfort feeds as long as family is are willing to accept complications associated with aspiration.  Discussed with daughter the importance of continued conversation with the medical providers regarding overall plan of care and treatment options,  ensuring decisions are within the context of the patients values and GOCs.  Questions and concerns addressed     Hard Choices booklet was left for review, MOST form was introduced and left for review  Total time spent on the unit was 35 minutes  Greater than 50% of the time was spent in counseling and coordination of care  Wadie Lessen NP  Palliative Medicine Team Team Phone # [phone] Pager [phone]

## 2021-04-29 NOTE — Progress Notes (Signed)
Pt turns to voice during shift, incomprehensible words noted, no signs of pain or discomfort noted, heel offloading boots in use, dressings changed as dated, pt is total care, turn every two hours, no concerns noted at this time.

## 2021-04-29 NOTE — Progress Notes (Signed)
Speech Language Pathology Treatment:    Patient Details Name: [name] MRN: [id number] DOB: 06/01/1922 Today's Date: 04/29/2021 Time: 1212-     Assessment / Plan / Recommendation Clinical Impression  Pt was seen during lunch for dysphagia treatment. He was alert for the majority of the session, but was noted to fall asleep once prior to swallowing a puree bolus. Pt's RN reported that the pt has been tolerating the current diet without overt s/sx of aspiration. Pt tolerated puree and nectar thick liquids without overt s/sx of aspiration once swallowing precautions were observed. Pt demonstrated throat clearing and delayed coughing with four consecutive swallows of nectar thick liquids and with thin liquids. Oral holding of 12-30+ seconds was noted with purees and a pharyngeal delay is suspected. Pt's current diet will be continued with observance of swallowing precautions. A modified barium swallow study will be conducted to further assess physiology.    HPI HPI: This 87 y.o. African American male  presented to the [facility] Emergency Department via EMS on 9/22 with complaints of new onset seizure disorder.  The patient was intubated in the ER for airway protection. Extubated on 9/23. PMH: CAD with DES 2014, HTN, HLD, Dementia, Stroke, DMII, stage 4 PI, chronic sacral ulcer getting wound care      SLP Plan  Continue with current plan of care      Recommendations for follow up therapy are one component of a multi-disciplinary discharge planning process, led by the attending physician.  Recommendations may be updated based on patient status, additional functional criteria and insurance authorization.    Recommendations  Diet recommendations: Dysphagia 1 (puree);Nectar-thick liquid Liquids provided via: Cup;Straw Medication Administration: Crushed with puree Supervision: Patient able to self feed Compensations: Slow rate;Small sips/bites;Minimize environmental  distractions (avoid consecutive swallows) Postural Changes and/or Swallow Maneuvers: Seated upright 90 degrees;Upright 30-60 min after meal                Oral Care Recommendations: Staff/trained caregiver to provide oral care;Oral care BID Follow up Recommendations: Other (comment) (TBD) SLP Visit Diagnosis: Dysphagia, unspecified (R13.10) Plan: Continue with current plan of care       [name] I. Hardin Negus, [place], [place] Office number [phone] Pager [place]  04/29/2021, 1:08 PM

## 2021-04-30 ENCOUNTER — Inpatient Hospital Stay (HOSPITAL_COMMUNITY): Payer: Medicare HMO

## 2021-04-30 DIAGNOSIS — R1312 Dysphagia, oropharyngeal phase: Secondary | ICD-10-CM

## 2021-04-30 DIAGNOSIS — Z515 Encounter for palliative care: Secondary | ICD-10-CM | POA: Diagnosis not present

## 2021-04-30 LAB — BASIC METABOLIC PANEL
Anion gap: 4 — ABNORMAL LOW (ref 5–15)
BUN: 8 mg/dL (ref 8–23)
CO2: 26 mmol/L (ref 22–32)
Calcium: 8.8 mg/dL — ABNORMAL LOW (ref 8.9–10.3)
Chloride: 112 mmol/L — ABNORMAL HIGH (ref 98–111)
Creatinine, Ser: 1.12 mg/dL (ref 0.61–1.24)
GFR, Estimated: 60 mL/min — ABNORMAL LOW (ref >60)
Glucose, Bld: 146 mg/dL — ABNORMAL HIGH (ref 70–99)
Potassium: 4.3 mmol/L (ref 3.5–5.1)
Sodium: 142 mmol/L (ref 135–145)

## 2021-04-30 LAB — CBC
HCT: 21.8 % — ABNORMAL LOW (ref 39.0–52.0)
Hemoglobin: 6.8 g/dL — CL (ref 13.0–17.0)
MCH: 29.4 pg (ref 26.0–34.0)
MCHC: 31.2 g/dL (ref 30.0–36.0)
MCV: 94.4 fL (ref 80.0–100.0)
Platelets: 234 10*3/uL (ref 150–400)
RBC: 2.31 MIL/uL — ABNORMAL LOW (ref 4.22–5.81)
RDW: 14.8 % (ref 11.5–15.5)
WBC: 8.3 10*3/uL (ref 4.0–10.5)
nRBC: 0 % (ref 0.0–0.2)

## 2021-04-30 LAB — HEMOGLOBIN AND HEMATOCRIT, BLOOD
HCT: 25.3 % — ABNORMAL LOW (ref 39.0–52.0)
Hemoglobin: 8.1 g/dL — ABNORMAL LOW (ref 13.0–17.0)

## 2021-04-30 LAB — GLUCOSE, CAPILLARY
Glucose-Capillary: 108 mg/dL — ABNORMAL HIGH (ref 70–99)
Glucose-Capillary: 135 mg/dL — ABNORMAL HIGH (ref 70–99)
Glucose-Capillary: 135 mg/dL — ABNORMAL HIGH (ref 70–99)
Glucose-Capillary: 145 mg/dL — ABNORMAL HIGH (ref 70–99)
Glucose-Capillary: 149 mg/dL — ABNORMAL HIGH (ref 70–99)
Glucose-Capillary: 173 mg/dL — ABNORMAL HIGH (ref 70–99)
Glucose-Capillary: 180 mg/dL — ABNORMAL HIGH (ref 70–99)

## 2021-04-30 LAB — PREPARE RBC (CROSSMATCH)

## 2021-04-30 LAB — MAGNESIUM: Magnesium: 1.9 mg/dL (ref 1.7–2.4)

## 2021-04-30 MED ORDER — DIPHENHYDRAMINE HCL 25 MG PO CAPS
25.0000 mg | ORAL_CAPSULE | Freq: Once | ORAL | Status: AC
  Administered 2021-04-30: 25 mg via ORAL

## 2021-04-30 MED ORDER — METOPROLOL TARTRATE 25 MG PO TABS
25.0000 mg | ORAL_TABLET | Freq: Two times a day (BID) | ORAL | Status: DC
  Administered 2021-04-30 – 2021-05-02 (×5): 25 mg via ORAL
  Filled 2021-04-30 (×5): qty 1

## 2021-04-30 MED ORDER — LEVETIRACETAM 100 MG/ML PO SOLN
250.0000 mg | Freq: Two times a day (BID) | ORAL | Status: DC
  Administered 2021-04-30 – 2021-05-02 (×5): 250 mg via ORAL
  Filled 2021-04-30 (×5): qty 5

## 2021-04-30 MED ORDER — DIPHENHYDRAMINE HCL 25 MG PO CAPS
25.0000 mg | ORAL_CAPSULE | Freq: Once | ORAL | Status: AC
  Filled 2021-04-30: qty 1

## 2021-04-30 MED ORDER — ACETAMINOPHEN 325 MG PO TABS
650.0000 mg | ORAL_TABLET | Freq: Once | ORAL | Status: AC
  Administered 2021-04-30: 650 mg via ORAL

## 2021-04-30 MED ORDER — SODIUM CHLORIDE 0.9% IV SOLUTION: Freq: Once | INTRAVENOUS | Status: AC

## 2021-04-30 MED ORDER — ACETAMINOPHEN 325 MG PO TABS
650.0000 mg | ORAL_TABLET | Freq: Once | ORAL | Status: AC
  Filled 2021-04-30: qty 2

## 2021-04-30 MED ORDER — FUROSEMIDE 10 MG/ML IJ SOLN
20.0000 mg | Freq: Once | INTRAMUSCULAR | Status: AC
  Administered 2021-04-30: 20 mg via INTRAVENOUS

## 2021-04-30 MED ORDER — MAGNESIUM SULFATE 2 GM/50ML IV SOLN
2.0000 g | Freq: Once | INTRAVENOUS | Status: AC
  Administered 2021-04-30: 2 g via INTRAVENOUS
  Filled 2021-04-30: qty 50

## 2021-04-30 MED ORDER — FUROSEMIDE 10 MG/ML IJ SOLN
20.0000 mg | Freq: Once | INTRAMUSCULAR | Status: AC
  Filled 2021-04-30: qty 2

## 2021-04-30 NOTE — Progress Notes (Addendum)
Notified by RN that Hgb this am is 6.8.  Reviewed chart and Dr. Sloan Leiter note states transfuse if Hgb less than 7.  Transfuse one unit of PRBC. Benadryl, tylenol and lasix before transfusion ordered.   43 am Called Daughter and explained situation to her and she consents to have blood transfused.

## 2021-04-30 NOTE — Progress Notes (Signed)
[facility] Progress Note  Patient: [name]    UJW:[id number]  DOA: 04/22/2021     Date of Service: the patient was seen and examined on 04/30/2021  Brief hospital course:  Past medical history of CAD with PCI, HTN, HLD, dementia, CVA, type II DM, stage IV pressure ulcer-presented with seizures/status epilepticus-intubated for airway protection on presentation.  Extubated on 9/23-and transferred to [facility] on 9/24.  Significant events: 9/21>> admit to ICU-intubated 9/21>> lumbar puncture 9/21-9/22>> LTM EEG: No seizures 9/22>> punch biopsy left thigh 9/23>> extubated 9/24>> transfer to [facility]  Subjective:  Appears unchanged-answering simple questions appropriately.  Objective: Vitals: Blood pressure (!) 137/37, pulse 84, temperature 98.4 F (36.9 C), temperature source Axillary, resp. rate (!) 22, height 6' (1.829 m), weight 72.4 kg, SpO2 100 %.   Exam: Gen Exam: Lying comfortably in bed-not in any distress. HEENT:atraumatic, normocephalic Chest: B/L clear to auscultation anteriorly CVS:S1S2 regular Abdomen:soft non tender, non distended Extremities:no edema Neurology: Stable chronic right-sided hemiparesis. Skin: no rash   Pertinent labs: Hb: 6.8 Mg: 1.9  Assessment and Plan: Acute hypoxic respiratory failure due to status epilepticus/aspiration pneumonia: Intubated on initial presentation in the emergency room for airway protection-currently on 2 L of oxygen via nasal cannula.  Remains on IV Unasyn until 9/29.  Status epilepticus-new onset focal seizure: Resolved-continue reduced dose Keppra 50 mg twice daily.  No further recommendations from neurology-needs outpatient neurology follow-up on discharge.  Acute metabolic encephalopathy: Slowly improving-following simple commands-daughter able to feed patient breakfast.  MRI brain without any acute abnormalities.  CSF with negative cultures-HSV PCR negative.  Arbovirus IgG CSF pending.  Continue supportive  care.  Dysphagia: Probably some amount of chronic dysphagia at baseline-worsened by acute illness-SLP following-currently on a dysphagia 1 diet-which he seems to be tolerating well.  Per daughter-eating meals when he is fed by her.    Bollous skin lesion: Awaiting skin biopsy-all lesions have deroofed spontaneously.  COPD: Stable-continue bronchodilators  Normocytic anemia: Appears to have chronic normocytic anemia at baseline-worsened due to acute illness.  No evidence of blood loss.  Hemoglobin decreased to 6.8 today-being transfused 1 unit of PRBC.  HTN: BP controlled-on IV Lopressor-has been transitioned to oral Lopressor today.  History of CVA with chronic right-sided hemiparesis: Resume aspirin  History of CAD s/p PCI 2014: Resume aspirin  Chronically bedridden  Goals of care discussion: Frail elderly-chronically bedridden with right-sided weakness-presenting with status epilepticus-now with severe dysphagia.  Started on dysphagia 1 diet which she seems to be tolerating.  Palliative care following-goals of care discussion in progress-we will await further recommendations from palliative care.  Overall long-term prognosis is obviously poor.  Nutrition Status: Nutrition Problem: Moderate Malnutrition Etiology: chronic illness (dementia) Signs/Symptoms: moderate fat depletion, severe muscle depletion Interventions: Nepro shake, Magic cup, MVI   Pressure Ulcer: Pressure Injury 07/12/19 Coccyx (Active)  07/12/19 2100  Location: Coccyx  Location Orientation:   Staging:   Wound Description (Comments):   Present on Admission:     DVT Prophylaxis: SQ heparin CODE STATUS: Full code Family communication: Daughter at bedside heparin injection 5,000 Units Start: 04/23/21 0600 SCDs Start: 04/22/21 2204  Disposition:  Status is: Inpatient  Remains inpatient appropriate because:IV treatments appropriate due to intensity of illness or inability to take PO  Dispo: The patient is  from: Home              Anticipated d/c is to: SNF              Patient currently is not  medically stable to d/c.   Difficult to place patient No     Time spent: 35 minutes. I reviewed all nursing notes, pharmacy notes, vitals, pertinent old records. I have discussed plan of care as described above with RN.  Oren Binet, MD [facility] 04/30/2021 12:07 PM  To reach On-call, see care teams to locate the attending and reach out via [email or web address]. Between 7PM-7AM, please contact night-coverage If you still have difficulty reaching the attending provider, please page the [facility] (Director on Call) for [facility] on amion for assistance.

## 2021-04-30 NOTE — Progress Notes (Addendum)
Physical Therapy Treatment and Discharge Patient Details Name: [name] MRN: [id number] DOB: 16-Aug-1921 Today's Date: 04/30/2021   History of Present Illness This 87 y.o. African American male  presented to the [facility] Emergency Department via EMS on 9/22 with complaints of new onset seizure disorder.  The patient was intubated in the ER for airway protection. Extubated on 9/23. PMH: CAD with DES 2014, HTN, HLD, Dementia, Stroke, DMII, stage 4 PI, chronic sacral ulcer getting wound care    PT Comments    Patient was placed on trial of PT after evaluation. He has been seen twice more for treatments and is not able to participate and benefit from PT. PT is signing off.     Recommendations for follow up therapy are one component of a multi-disciplinary discharge planning process, led by the attending physician.  Recommendations may be updated based on patient status, additional functional criteria and insurance authorization.  Follow Up Recommendations  No PT follow up;Supervision/Assistance - 24 hour     Equipment Recommendations  None recommended by PT    Recommendations for Other Services       Precautions / Restrictions Precautions Precautions: Fall     Mobility  Bed Mobility                    Transfers                    Ambulation/Gait                 Stairs             Wheelchair Mobility    Modified Rankin (Stroke Patients Only)       Balance                                            Cognition Arousal/Alertness: Awake/alert Behavior During Therapy: Flat affect Overall Cognitive Status: History of cognitive impairments - at baseline                                 General Comments: did not verbalize, but grunted when asked to state his name, yes/no questions. Unintelligible      Exercises General Exercises - Lower Extremity Heel Slides: PROM;Both;5  reps;Sidelying (pt positioned well on his side for pressure relief, therefore remained on his side)    General Comments General comments (skin integrity, edema, etc.): Per RN, pt was able to state his name earlier, but that was all.      Pertinent Vitals/Pain Pain Assessment: Faces Faces Pain Scale: No hurt    Home Living                      Prior Function            PT Goals (current goals can now be found in the care plan section) Acute Rehab PT Goals Patient Stated Goal: daughter plans to care for pt at home PT Goal Formulation: Patient unable to participate in goal setting Time For Goal Achievement: 05/08/21 Potential to Achieve Goals: Fair Progress towards PT goals: Not progressing toward goals - comment    Frequency    Min 2X/week (trial)      PT Plan      Co-evaluation  AM-PAC PT "6 Clicks" Mobility   Outcome Measure  Help needed turning from your back to your side while in a flat bed without using bedrails?: Total Help needed moving from lying on your back to sitting on the side of a flat bed without using bedrails?: Total Help needed moving to and from a bed to a chair (including a wheelchair)?: Total Help needed standing up from a chair using your arms (e.g., wheelchair or bedside chair)?: Total Help needed to walk in hospital room?: Total Help needed climbing 3-5 steps with a railing? : Total 6 Click Score: 6    End of Session Equipment Utilized During Treatment: Other (comment) (bil pressure relief boots reapplied after PROM of ankles) Activity Tolerance: Patient tolerated treatment well Patient left: in bed;with call bell/phone within reach;with bed alarm set Nurse Communication: Other (comment) (not able to follow commands; PT signing off) PT Visit Diagnosis: Muscle weakness (generalized) (M62.81);Other (comment);Other abnormalities of gait and mobility (R26.89) (contracture)   Patient is being discharged from PT services  secondary to:      Patient has made no progress toward goals in a reasonable time frame and there is no reasonable expectation that he will be able to participate and benefit.    Please see latest Therapy Progress Note for current level of functioning and progress toward goals.  Progress and discharge plan and discussed with patient/caregiver and they     Time: 4742-5956 PT Time Calculation (min) (ACUTE ONLY): 12 min  Charges:  $Therapeutic Exercise: 8-22 mins                      Arby Barrette, PT Pager [phone]    Rexanne Mano 04/30/2021, 3:25 PM

## 2021-04-30 NOTE — Progress Notes (Signed)
Modified Barium Swallow Progress Note  Patient Details  Name: [name] MRN: [id number] Date of Birth: [birth year]  Today's Date: 04/30/2021  Modified Barium Swallow completed.  Full report located under Chart Review in the Imaging Section.  Brief recommendations include the following:  Clinical Impression   Pt presented with oropharyngeal dysphagia characterized by impaired bolus awareness, impaired mastication, weak lingual manipulation, reduced bolus cohesion, reduced lingual retraction, reduced anterior laryngeal movement, and a pharyngeal delay. He demonstrated prolonged mastication of dysphagia 2 solids, oral holding, oral residue, premature spillage to the pyriform sinuses, vallecular residue, and pyriform sinus residue. Pt demonstrated trace silent aspiration (PAS 8) with thin liquids, and moderate aspiration (PAS 7) was noted once with puree. Aspiration of puree triggered prolonged vocalization and subsequent coughing which was ineffective in expelling the aspirate. Laryngeal invasion was secondary to premature spillage, the pharyngeal delay and pooling/residue in the pyriform sinuses. Considering the consistency of these factors, intermittent aspiration throughout meals is likely. Pt's daughter was contacted by this SLP and educated regarding the results of the study and SLP's concerns. SLP attempted to determine her wishes regarding non-oral alimentation vs continuing p.o. intake for comfort with known aspiration risk. Pt's daughter expressed that the pt is rarely symptomatic at home and that she is unsure of her desire at this time. Further discussions with PMT may be beneficial to assist family with [place] decisions.  Pt's current diet of dysphagia 1 solids and nectar thick liquids appears to present a reduced aspiration risk if swallowing precautions are strictly observed. SLP will continue to follow pt.   Swallow Evaluation Recommendations       SLP Diet Recommendations: Dysphagia 1  (Puree) solids;Nectar thick liquid   Liquid Administration via: Cup;Straw   Medication Administration: Crushed with puree   Supervision: Full supervision/cueing for compensatory strategies (Staff to feed pt)   Compensations: Slow rate;Small sips/bites;Minimize environmental distractions (avoid consecutive swallows)   Postural Changes: Seated upright at 90 degrees          Mckenzi Buonomo I. Hardin Negus, [place], [place] Office number [phone] Pager [phone]   Horton Marshall 04/30/2021,11:58 AM

## 2021-04-30 NOTE — Progress Notes (Signed)
Nutrition Follow-up  DOCUMENTATION CODES:   Non-severe (moderate) malnutrition in context of chronic illness  INTERVENTION:   -Continue Nepro Shake po TID, each supplement provides 425 kcal and 19 grams protein  -Continue MVI with minerals daily -Continue Magic cup TID with meals, each supplement provides 290 kcal and 9 grams of protein  -Continue feeding assistance with meals  NUTRITION DIAGNOSIS:   Moderate Malnutrition related to chronic illness (dementia) as evidenced by moderate fat depletion, severe muscle depletion.  Ongoing  GOAL:   Patient will meet greater than or equal to 90% of their needs  Progressing   MONITOR:   PO intake, Supplement acceptance, Diet advancement, Labs, Weight trends, Skin, I & O's  REASON FOR ASSESSMENT:   Ventilator, Consult Enteral/tube feeding initiation and management  ASSESSMENT:   87 year old male who presented to the ED on 9/21 after having seizure-like activity. Pt required intubation in the ED. PMH of dementia with behavioral disturbance, depression, HLD, HTN, stroke, T2DM, CAD, wheelchair-bound at baseline.  9/22- s/p skin bunch biopsy for unexplained bullous rash 9/23- extubated  9/26- s/p BSE- dysphagia 1 diet with nectar thick liquids, diet held due to pt coughing when eating 9/27- s/p BSE- advanced to dysphagia 1 diet with nectar thick liquids   Reviewed I/O's: -954 ml x 24 hours and +5.9 L since admission  UOP: 1.7 L x 24 hours  Pt receiving nursing care at time of visit.   Noted meal completions 100%. Pt is taking Nepro supplements per MAR.   Palliative care has met with opt and family. Pt daughter does not desire a feeding tube and is hopeful that pt will be able to sustain himself by PO's alone.   Medications reviewed and include keppra.   Labs reviewed: CBGS: 108-145 (inpatient orders for glycemic control are none).    Diet Order:   Diet Order             DIET - DYS 1 Room service appropriate? No; Fluid  consistency: Nectar Thick  Diet effective now                   EDUCATION NEEDS:   Not appropriate for education at this time  Skin:  Skin Assessment: Skin Integrity Issues: Skin Integrity Issues:: Other (Comment) Other: pressure injury to coccyx (no stage noted)  Last BM:  04/27/21  Height:   Ht Readings from Last 1 Encounters:  04/22/21 6' (1.829 m)    Weight:   Wt Readings from Last 1 Encounters:  04/30/21 72.4 kg   BMI:  Body mass index is 21.65 kg/m.  Estimated Nutritional Needs:   Kcal:  1850-2050  Protein:  105-120 grams  Fluid:  > 1.8 L    Loistine Chance, RD, LDN, [place] Registered Dietitian II Certified Diabetes Care and Education Specialist Please refer to [facility] for RD and/or RD on-call/weekend/after hours pager

## 2021-05-01 ENCOUNTER — Other Ambulatory Visit (HOSPITAL_COMMUNITY): Payer: Self-pay

## 2021-05-01 DIAGNOSIS — G40901 Epilepsy, unspecified, not intractable, with status epilepticus: Secondary | ICD-10-CM | POA: Diagnosis not present

## 2021-05-01 DIAGNOSIS — Z7189 Other specified counseling: Secondary | ICD-10-CM | POA: Diagnosis not present

## 2021-05-01 DIAGNOSIS — E440 Moderate protein-calorie malnutrition: Secondary | ICD-10-CM | POA: Diagnosis not present

## 2021-05-01 LAB — CBC
HCT: 25.4 % — ABNORMAL LOW (ref 39.0–52.0)
Hemoglobin: 8.1 g/dL — ABNORMAL LOW (ref 13.0–17.0)
MCH: 30 pg (ref 26.0–34.0)
MCHC: 31.9 g/dL (ref 30.0–36.0)
MCV: 94.1 fL (ref 80.0–100.0)
Platelets: 246 10*3/uL (ref 150–400)
RBC: 2.7 MIL/uL — ABNORMAL LOW (ref 4.22–5.81)
RDW: 15.6 % — ABNORMAL HIGH (ref 11.5–15.5)
WBC: 9 10*3/uL (ref 4.0–10.5)
nRBC: 0 % (ref 0.0–0.2)

## 2021-05-01 LAB — BPAM RBC
Blood Product Expiration Date: 202210292359
ISSUE DATE / TIME: 202209291058
Unit Type and Rh: 6200

## 2021-05-01 LAB — TYPE AND SCREEN
ABO/RH(D): AB POS
Antibody Screen: NEGATIVE
Unit division: 0

## 2021-05-01 LAB — GLUCOSE, CAPILLARY
Glucose-Capillary: 132 mg/dL — ABNORMAL HIGH (ref 70–99)
Glucose-Capillary: 115 mg/dL — ABNORMAL HIGH (ref 70–99)
Glucose-Capillary: 144 mg/dL — ABNORMAL HIGH (ref 70–99)

## 2021-05-01 LAB — MAGNESIUM: Magnesium: 2 mg/dL (ref 1.7–2.4)

## 2021-05-01 MED ORDER — LEVETIRACETAM 100 MG/ML PO SOLN
250.0000 mg | Freq: Two times a day (BID) | ORAL | 12 refills | Status: AC
  Filled 2021-05-01: qty 473, 95d supply, fill #0

## 2021-05-01 NOTE — Progress Notes (Signed)
Speech Language Pathology Treatment: Dysphagia  Patient Details Name: [name] MRN: [id number] DOB: 1922/06/24 Today's Date: 05/01/2021 Time: 1975-8832 SLP Time Calculation (min) (ACUTE ONLY): 13 min  Assessment / Plan / Recommendation Clinical Impression  Pt was seen for dysphagia treatment. Pt's NT reported that the pt tolerated breakfast well without any episodes of coughing and consumed 75% of the meal. Pt was alert during the session and vocalized in response to one question. Pt tolerated puree boluses and nectar thick liquids via straw without overt s/sx of aspiration. Length of oral holding with liquids was improved compared to the last treatment session on 9/28. Length of oral holding has not demonstrated any significant improvement, but oral clearance was more functional. SLP suspects acute on chronic dysphagia which may be slightly improved today. Pt's current diet of dysphagia 1 solids and nectar thick liquids will be continued. SLP will continue to follow pt briefly.   HPI HPI: This 87 y.o. African American male  presented to the [facility] Emergency Department via EMS on 9/22 with complaints of new onset seizure disorder.  The patient was intubated in the ER for airway protection. Extubated on 9/23. PMH: CAD with DES 2014, HTN, HLD, Dementia, Stroke, DMII, stage 4 PI, chronic sacral ulcer getting wound care      SLP Plan  Continue with current plan of care      Recommendations for follow up therapy are one component of a multi-disciplinary discharge planning process, led by the attending physician.  Recommendations may be updated based on patient status, additional functional criteria and insurance authorization.    Recommendations  Diet recommendations: Dysphagia 1 (puree);Nectar-thick liquid Liquids provided via: Cup;Straw Medication Administration: Crushed with puree Supervision: Patient able to self feed Compensations: Slow rate;Small  sips/bites;Minimize environmental distractions (avoid consecutive swallows) Postural Changes and/or Swallow Maneuvers: Seated upright 90 degrees;Upright 30-60 min after meal                Oral Care Recommendations: Staff/trained caregiver to provide oral care;Oral care BID Follow up Recommendations: Other (comment) (TBD) SLP Visit Diagnosis: Dysphagia, unspecified (R13.10) Plan: Continue with current plan of care       [name] I. Hardin Negus, [place], [place] Office number [phone] Pager [phone]                 [name]  05/01/2021, 10:19 AM

## 2021-05-01 NOTE — Progress Notes (Signed)
Patient ID: [name], male   DOB: Sep 08, 1921, 87 y.o.   MRN: [id number]    Progress Note from the Palliative Medicine Team at [facility]   Patient Name: [name]        Date: 05/01/2021 DOB: November 02, 1921  Age: 87 y.o. MRN#: [id number] Attending Physician: Jonetta Osgood, MD Primary Care Physician: Patient, No Pcp Per (Inactive) Admit Date: 04/22/2021   Medical records reviewed   Past medical history of CAD with PCI, HTN, HLD, dementia, CVA, type II DM, stage IV pressure ulcer-presented with seizures/status epilepticus-intubated for airway protection on presentation.  Extubated on 04-24-21.  Continued high risk for decompensation.  Patient lethargic, following some simple commands.  Patient is unable to make his own healthcare decisions.    Daughter is presented with  treatment option decisions, advanced directive decisions and anticipatory care needs.   Outstanding decision relates to his severe dysphagia and high risk for aspiration.  This NP visited patient at the bedside as a follow up for palliative medicine needs and emotional support.   Daughter/ [name] at bedside.  I attempted to have a continued conversation regarding current medical situation; diagnosis, prognosis, goals of care,  and options.  I specifically addressed concerns regarding his difficulty with swallow and high risk of aspiration.  Presented option of comfort feeds with known risk of aspiration.   Education offered on concept specific to adult failure to thrive secondary to multiple comorbidities and the limitations of medical interventions to prolong quality of life when the body does fail to thrive.  Daughter did not engage in conversation, told she had "leave now" and left the room.  Discussed with [place] Phillips/ Speech Pathologist and Dr Estell Harpin  Total time spent on the unit was 15 minutes  Greater than 50% of the time was spent in counseling and coordination of care  Wadie Lessen NP  Palliative  Medicine Team Team Phone # [phone] Pager [phone]

## 2021-05-01 NOTE — Progress Notes (Signed)
1553: Nurse reviewed d/c instructions with patient's daughter, [name], over the phone.  No further questions or concerns at this time. Will send the foam dressings in patient's room home with him so she can have some prior to home health starting with the patient.

## 2021-05-01 NOTE — TOC Transition Note (Addendum)
Transition of Care [facility]) - CM/SW Discharge Note   Patient Details  Name: [name] MRN: [id number] Date of Birth: 04-20-1922  Transition of Care [facility]) CM/SW Contact:  Verdell Carmine, RN Phone Number: 05/01/2021, 2:01 PM   Clinical Narrative:     Patient daughter [name] called in and would like suction at home. Spoke to Dr Raul Del, ordered via [employer] for delivery  [facility] called back and declined [place] stacy declined Burnard Leigh  Accepted can see the patient tomorrow    Barriers to Discharge: Transportation   Patient Goals and CMS Choice Patient states their goals for this hospitalization and ongoing recovery are:: To go home      Discharge Placement             Home with home health          Discharge Plan and Services   Discharge Planning Services: CM Consult            DME Arranged: Suction DME Agency: [employer] Date DME Agency Contacted: 05/01/21 Time DME Agency Contacted: 1400 Representative spoke with at DME Agency: [facility] HH Arranged: RN, Speech Therapy [place] Agency: [place] Date [place]: 05/01/21 Time [place]: 1400 Representative spoke with at [place]: [place] ([place]) Interventions     Readmission Risk Interventions Readmission Risk Prevention Plan 12/07/2019  Transportation Screening Complete  PCP or Specialist Appt within 3-5 Days Complete  HRI or [place] Complete  Social Work Consult for [place] Planning/Counseling Complete  Palliative Care Screening Complete  Medication Review Press photographer) Complete  Some recent data might be hidden

## 2021-05-01 NOTE — Progress Notes (Signed)
Daily Progress Note   Patient Name: [name]       Date: 05/01/2021 DOB: 1922-05-29  Age: 87 y.o. MRN#: [id number] Attending Physician: Jonetta Osgood, MD Primary Care Physician: Patient, No Pcp Per (Inactive) Admit Date: 04/22/2021  Reason for Consultation/Follow-up: Establishing goals of care  Subjective: Medical records reviewed. Assessed patient at the bedside. He is alert, repeats what he hears but does not answer questions clearly at this time. No family present.  Called patient's daughter [name] to offer support and follow up on goals of care conversation. She confirms that she currently prefers to focus on efforts to advance his oral diet and avoid feeding tube placement. She feels that the patient is progressing slowly and understands he is still at risk of aspiration. [name] states that she is prepared to supervise his meals while following swallowing precautions. She remains hopeful that he will be able to recover to his baseline and return home. We discussed the potential for complications and the importance of continued conversation, emphasizing patient's values/preferences and quality of life. Patient's daughter verbalizes her understanding.   Questions and concerns addressed. PMT will continue to support holistically.   Length of Stay: 9  Current Medications: Scheduled Meds:   aspirin  81 mg Oral Daily   bacitracin  1 application Topical BID   budesonide (PULMICORT) nebulizer solution  0.25 mg Nebulization BID   chlorhexidine gluconate (MEDLINE KIT)  15 mL Mouth Rinse BID   Chlorhexidine Gluconate Cloth  6 each Topical Daily   clobetasol cream  1 application Topical BID   feeding supplement (NEPRO CARB STEADY)  237 mL Oral TID BM   heparin  5,000 Units Subcutaneous Q8H    ipratropium-albuterol  3 mL Nebulization BID   levETIRAcetam  250 mg Oral BID   metoprolol tartrate  25 mg Oral BID   multivitamin with minerals  1 tablet Oral Daily    Continuous Infusions:  dextrose 10 mL/hr at 04/29/21 1218    PRN Meds: albuterol, lidocaine HCl (PF)  Physical Exam Vitals and nursing note reviewed.  Constitutional:      General: He is not in acute distress.    Appearance: He is ill-appearing.     Interventions: Nasal cannula in place.     Comments: Elderly AA male, excessive secretions audible  Cardiovascular:  Rate and Rhythm: Normal rate.  Pulmonary:     Effort: Pulmonary effort is normal. No respiratory distress.  Neurological:     Mental Status: He is alert. Mental status is at baseline.            Vital Signs: BP (!) 136/46 (BP Location: Left Leg)   Pulse 77   Temp 98.1 F (36.7 C) (Axillary)   Resp 20   Ht 6' (1.829 m)   Wt 72.4 kg   SpO2 100%   BMI 21.65 kg/m  SpO2: SpO2: 100 % O2 Device: O2 Device: Nasal Cannula O2 Flow Rate: O2 Flow Rate (L/min): 2 L/min  Intake/output summary:  Intake/Output Summary (Last 24 hours) at 05/01/2021 0955 Last data filed at 05/01/2021 9528 Gross per 24 hour  Intake 1221.92 ml  Output 2300 ml  Net -1078.08 ml    LBM: Last BM Date: 04/27/21 Baseline Weight: Weight: 79.4 kg Most recent weight: Weight: 72.4 kg       Palliative Assessment/Data: 30%    Flowsheet Rows    Flowsheet Row Most Recent Value  Intake Tab   Referral Department Critical care  Unit at Time of Referral ICU  Palliative Care Primary Diagnosis Neurology  Date Notified 04/24/21  Palliative Care Type New Palliative care  Reason for referral Clarify Goals of Care  Date of Admission 04/22/21  Date first seen by Palliative Care 04/24/21  # of days Palliative referral response time 0 Day(s)  # of days IP prior to Palliative referral 2  Clinical Assessment   Psychosocial & Spiritual Assessment   Palliative Care Outcomes         Patient Active Problem List   Diagnosis Date Noted   Malnutrition of moderate degree 04/23/2021   Status epilepticus ([place]) 04/22/2021   Normocytic anemia 04/22/2021   Skin bulla 04/22/2021   Endotracheally intubated 04/22/2021   Abnormal CXR 04/22/2021   Advanced dementia ([place])    Goals of care, counseling/discussion    Palliative care by specialist    Osteomyelitis ([place]) 12/05/2019   Multiple skin tears    Pressure injury of sacral region, stage 4 ([place]) 10/12/2019   Vascular dementia with behavior disturbance ([place]) 10/12/2019   Bilateral hearing loss due to cerumen impaction 10/12/2019   Debility 10/12/2019   Pneumonia due to 2019 novel coronavirus 07/06/2019   Pneumonia due to COVID-19 virus 07/05/2019   Pulmonary embolism ([place]) 06/01/2019   Acute renal failure superimposed on stage 3 chronic kidney disease ([place]) 06/01/2019   Chronic diastolic CHF (congestive heart failure) ([place]) 06/01/2019   Pain due to onychomycosis of toenails of both feet 01/23/2019   Bilateral lower extremity edema 07/05/2017   Pulmonary nodules 04/13/2017   Centrilobular emphysema ([place]) 04/13/2017   Calculus of gallbladder without cholecystitis without obstruction 04/13/2017   Chronic cough 04/13/2017   Adenoma of left adrenal gland 04/13/2017   Frequent falls 04/01/2017   Type 2 diabetes mellitus with stage 3 chronic kidney disease, with long-term current use of insulin ([place]) 04/01/2017   History of tobacco abuse 04/01/2017   Unsteady gait 04/01/2017   Dysarthria 04/01/2017   History of stroke    Hyperlipidemia    End of life care 11/19/2015   Moderate dementia with behavioral disturbance ([place]) 09/19/2015   Depression 09/19/2015   Hypertensive heart disease 09/10/2015   Type 2 diabetes, controlled, with peripheral circulatory disorder ([place])    Essential hypertension    Coronary artery disease involving native coronary artery of native heart with angina pectoris ([place])  Arthritis    Glaucoma      Palliative Care Assessment & Plan   Patient Profile: 87 y.o. male  with past medical history of dementia with behavioral disturbance, depression, hyperlipidemia, hyperglycemia, prior history of stroke with residual right-sided weakness, wheelchair-bound at baseline requiring 24/7 help with ADLs, CAD s/p PCI, stage 4 pressure ulcer, admitted on 04/22/2021 with new onset seizures.    Patient is nonverbal and bedbound at baseline. Palliative medicine has been consulted to assist with goals of care conversation.  Assessment: Status epilepticus Advanced dementia Dysphagia, improving Moderate protein-calorie malnutrition Stage IV sacral pressure ulcer Goals of care conversation  Recommendations/Plan: Patient's daughter [name] remains committed to optimizing oral diet and avoiding feeding tube placement Continue full code/full scope treatment Psychosocial and emotional support provided PMT will continue to support as family is faced with complex decision-making   Prognosis:  Poor long-term prognosis given multiple chronic comorbidities, cognitive state, and frailty   Discharge Planning: To Be Determined  Care plan was discussed with patient's daughter [name]  Total time: 15 minutes Greater than 50% of this time was spent in counseling and coordinating care related to the above assessment and plan.  Dorthy Cooler, PA-C Palliative Medicine Team Team phone # [phone]  Thank you for allowing the Palliative Medicine Team to assist in the care of this patient. Please utilize secure chat with additional questions, if there is no response within 30 minutes please call the above phone number.  Palliative Medicine Team providers are available by phone from 7am to 7pm daily and can be reached through the team cell phone.  Should this patient require assistance outside of these hours, please call the patient's attending physician.

## 2021-05-01 NOTE — TOC Transition Note (Addendum)
Transition of Care [facility]) - CM/SW Discharge Note   Patient Details  Name: [name] MRN: [id number] Date of Birth: 1922-04-11  Transition of Care [facility]) CM/SW Contact:  Verdell Carmine, RN Phone Number: 05/01/2021, 11:21 AM   Clinical Narrative:     Patient for discharge today will need PTAR home. Spoke to [place] from [facility] accepted for PT OT RN and SLP services. She is aware that he is discharging today. Has DME at home. CSW did medical necessity and  PTAR will be called [name], patient daughter, mailbox is full.  [address] [place] declined services , Called Daughter [name] to let her know and as well to obtain consent for other agencies. [name]  would like services that work best with his insurance, but also she works from home nad has to work starting at 30. And would like to have the therapy for him in the AM. [phone] She consented to call [facility]. Spoke to [place] at [place], in the chart review he wondered if He would really benefit for PT and OT services. He is calling to see if they can accommodate the early timeframe. Discussed with [facility] palliative versus hospice, she does not want hospice services, and felt that they were telling her to "give up" on her father.   [place] accept.  Called [place]acy at [place] accept [facility] accepted by Seth Bake   Barriers to Discharge: Transportation   Patient Goals and CMS Choice Patient states their goals for this hospitalization and ongoing recovery are:: To go home      Discharge Placement             Home with Home Health          Discharge Plan and Services   Discharge Planning Services: CM Consult                      HH Arranged: RN, PT, OT, Speech Therapy HH Agency: [place] ([place]) Date [facility] Agency Contacted: 05/01/21 Time HH Agency Contacted: 1119 Representative spoke with at [place]: [place] ([place]) Interventions     Readmission Risk  Interventions Readmission Risk Prevention Plan 12/07/2019  Transportation Screening Complete  PCP or Specialist Appt within 3-5 Days Complete  HRI or [place] Complete  Social Work Consult for [place] Planning/Counseling Complete  Palliative Care Screening Complete  Medication Review Press photographer) Complete  Some recent data might be hidden

## 2021-05-01 NOTE — Consult Note (Signed)
   [facility] CM Inpatient Consult   05/01/2021  Sajid Ruppert 1922-04-02 [id number]  [place] Organization [ACO] Patient: [facility]  Primary Care Provider:  Patient, No Pcp Per (Inactive)  Needs to follow up   Patient screened for length of stay hospitalization to assess for potential [place] [facility] service needs for post hospital transition.  Review of patient's medical record reveals patient is for home today with home health. Attempts to contact patient in room not successful.    Plan:   Will have referral for [place] Coordinator follow up.  For questions contact:   Natividad Brood, RN BSN [facility] Liaison  [phone] business mobile phone Toll free office [phone]  Fax number: [phone] [email or web address]

## 2021-05-01 NOTE — Discharge Summary (Signed)
PATIENT DETAILS Name: [name] Age: 87 y.o. Sex: male Date of Birth: 10/22/1921 MRN: [id number]. Admitting Physician: Renee Pain, MD VZD:GLOVFIE, No Pcp Per (Inactive)  Admit Date: 04/22/2021 Discharge date: 05/01/2021  Recommendations for Outpatient Follow-up:  Follow up with PCP in 1-2 weeks Please obtain CMP/CBC in one week PCP to consider outpatient palliative care follow-up Please follow skin biopsy results-still pending at the time of discharge. CSF arbovirus IgG pending-please follow.  Admitted From:  Home  Disposition: Home with home health services  [place]: No  Equipment/Devices: None  Discharge Condition: Stable  CODE STATUS: FULL CODE  Diet recommendation:  Diet Order             Diet - low sodium heart healthy           DIET - DYS 1 Room service appropriate? No; Fluid consistency: Nectar Thick  Diet effective now                   Brief hospital course:  Past medical history of CAD with PCI, HTN, HLD, dementia, CVA, type II DM, stage IV pressure ulcer-presented with seizures/status epilepticus-intubated for airway protection on presentation.  Extubated on 9/23-and transferred to [facility] on 9/24.   Significant events: 9/21>> admit to ICU-intubated 9/21>> lumbar puncture 9/21-9/22>> LTM EEG: No seizures 9/22>> punch biopsy left thigh 9/23>> extubated 9/24>> transfer to [place] Hospital Course: Acute hypoxic respiratory failure due to status epilepticus/aspiration pneumonia: Intubated on initial presentation in the emergency room for airway protection-currently on quiet intermittent 2 L of oxygen for comfort at times.  Completed a course of Unasyn-does not require any further antimicrobial therapy on discharge.   Status epilepticus-new onset focal seizure: Resolved-continue reduced dose Keppra 250 mg twice daily.  No further recommendations from neurology-needs outpatient neurology follow-up on discharge.   Acute metabolic  encephalopathy: Slowly improving-following simple commands-daughter able to feed patient breakfast.  MRI brain without any acute abnormalities.  CSF with negative cultures-HSV PCR negative.  Arbovirus IgG CSF pending.    Dysphagia: Probably some amount of chronic dysphagia at baseline-worsened by acute illness-SLP following-currently on a dysphagia 1 diet-which he seems to be tolerating well.  Per daughter-eating meals when he is fed by her.     Bollous skin lesion: Awaiting skin biopsy-all lesions have deroofed spontaneously.   COPD: Stable-continue bronchodilators   Normocytic anemia: Appears to have chronic normocytic anemia at baseline-worsened due to acute illness.  No evidence of blood loss.  Did require 1 unit of PRBC on 9/29-hemoglobin stable at 8.1 a day of discharge.   HTN: BP controlled-continue oral Lopressor.  Resume losartan when able.   History of CVA with chronic right-sided hemiparesis: Continue aspirin  History of CAD s/p PCI 2014: Resume aspirin   Chronically bedridden   Goals of care discussion: Frail elderly-chronically bedridden with right-sided weakness-presenting with status epilepticus-now with severe dysphagia.  Started on dysphagia 1 diet which she seems to be tolerating.  Palliative care followed closely-remains full code-family aware of overall poor long-term prognosis-suggest outpatient palliative care involvement going forward.    Nutrition Status: Nutrition Problem: Moderate Malnutrition Etiology: chronic illness (dementia) Signs/Symptoms: moderate fat depletion, severe muscle depletion Interventions: Nepro shake, Magic cup, MVI   Pressure Ulcer: Pressure Injury 07/12/19 Coccyx (Active)  07/12/19 2100  Location: Coccyx  Location Orientation:   Staging:   Wound Description (Comments):   Present on Admission:      Procedures sEE ABOVE  Discharge Diagnoses:  Principal Problem:  Status epilepticus ([place]) Active Problems:   Type 2 diabetes,  controlled, with peripheral circulatory disorder (HCC)   Centrilobular emphysema (HCC)   Multiple skin tears   Normocytic anemia   Skin bulla   Endotracheally intubated   Abnormal CXR   Malnutrition of moderate degree   Discharge Instructions:  Activity:  As tolerated with Full fall precautions use walker/cane & assistance as needed   Discharge Instructions     Diet - low sodium heart healthy   Complete by: As directed    Dysphagia 1 diet with nectar thick liquids.   Discharge instructions   Complete by: As directed    Follow with Primary MD  in 1-2 weeks  Skin biopsy results are pending-please have your primary care practitioner follow these results.  Your blood pressure is controlled with just metoprolol-losartan remains on hold at the time of discharge-please contact your primary care practitioner for further optimization.  Please get a complete blood count and chemistry panel checked by your Primary MD at your next visit, and again as instructed by your Primary MD.  Get Medicines reviewed and adjusted: Please take all your medications with you for your next visit with your Primary MD  Laboratory/radiological data: Please request your Primary MD to go over all hospital tests and procedure/radiological results at the follow up, please ask your Primary MD to get all Hospital records sent to his/her office.  In some cases, they will be blood work, cultures and biopsy results pending at the time of your discharge. Please request that your primary care M.D. follows up on these results.  Also Note the following: If you experience worsening of your admission symptoms, develop shortness of breath, life threatening emergency, suicidal or homicidal thoughts you must seek medical attention immediately by calling 911 or calling your MD immediately  if symptoms less severe.  You must read complete instructions/literature along with all the possible adverse reactions/side effects for  all the Medicines you take and that have been prescribed to you. Take any new Medicines after you have completely understood and accpet all the possible adverse reactions/side effects.   Do not drive when taking Pain medications or sleeping medications (Benzodaizepines)  Do not take more than prescribed Pain, Sleep and Anxiety Medications. It is not advisable to combine anxiety,sleep and pain medications without talking with your primary care practitioner  Special Instructions: If you have smoked or chewed Tobacco  in the last 2 yrs please stop smoking, stop any regular Alcohol  and or any Recreational drug use.  Wear Seat belts while driving.  Please note: You were cared for by a hospitalist during your hospital stay. Once you are discharged, your primary care physician will handle any further medical issues. Please note that NO REFILLS for any discharge medications will be authorized once you are discharged, as it is imperative that you return to your primary care physician (or establish a relationship with a primary care physician if you do not have one) for your post hospital discharge needs so that they can reassess your need for medications and monitor your lab values.   Seizure precautions: Per [facility] statutes, patients with seizures are not allowed to drive until they have been seizure-free for six months and cleared by a physician    Use caution when using heavy equipment or power tools. Avoid working on ladders or at heights. Take showers instead of baths. Ensure the water temperature is not too high on the home water heater. Do not go  swimming alone. Do not lock yourself in a room alone (i.e. bathroom). When caring for infants or small children, sit down when holding, feeding, or changing them to minimize risk of injury to the child in the event you have a seizure. Maintain good sleep hygiene. Avoid alcohol.    If patient has another seizure, call 911 and bring them back to  the ED if: A.  The seizure lasts longer than 5 minutes.      B.  The patient doesn't wake shortly after the seizure or has new problems such as difficulty seeing, speaking or moving following the seizure C.  The patient was injured during the seizure D.  The patient has a temperature over 102 F (39C) E.  The patient vomited during the seizure and now is having trouble breathing    During the Seizure   - First, ensure adequate ventilation and place patients on the floor on their left side  Loosen clothing around the neck and ensure the airway is patent. If the patient is clenching the teeth, do not force the mouth open with any object as this can cause severe damage - Remove all items from the surrounding that can be hazardous. The patient may be oblivious to what's happening and may not even know what he or she is doing. If the patient is confused and wandering, either gently guide him/her away and block access to outside areas - Reassure the individual and be comforting - Call 911. In most cases, the seizure ends before EMS arrives. However, there are cases when seizures may last over 3 to 5 minutes. Or the individual may have developed breathing difficulties or severe injuries. If a pregnant patient or a person with diabetes develops a seizure, it is prudent to call an ambulance. - Finally, if the patient does not regain full consciousness, then call EMS. Most patients will remain confused for about 45 to 90 minutes after a seizure, so you must use judgment in calling for help. - Avoid restraints but make sure the patient is in a bed with padded side rails - Place the individual in a lateral position with the neck slightly flexed; this will help the saliva drain from the mouth and prevent the tongue from falling backward - Remove all nearby furniture and other hazards from the area - Provide verbal assurance as the individual is regaining consciousness - Provide the patient with privacy if  possible - Call for help and start treatment as ordered by the caregiver    After the Seizure (Postictal Stage)   After a seizure, most patients experience confusion, fatigue, muscle pain and/or a headache. Thus, one should permit the individual to sleep. For the next few days, reassurance is essential. Being calm and helping reorient the person is also of importance.   Most seizures are painless and end spontaneously. Seizures are not harmful to others but can lead to complications such as stress on the lungs, brain and the heart. Individuals with prior lung problems may develop labored breathing and respiratory distress.   Discharge wound care:   Complete by: As directed    1.Cleanse sacral wound with NS and pat dry. Apply Aquacel ag to wound bed. [facility] # [id number]) Cover with sacral foam.  Change every three days, please date dressing.   2.Blistering to left hand, bilateral arms, thighs and left axilla:  Clean wounds with NS and pat dry. Apply small piece of Mepitel silicone contact layer  to wound bed and cover with foam  dressing.  Date dressing and change every three days (along with sacral wound)   Increase activity slowly   Complete by: As directed       Allergies as of 05/01/2021       Reactions   Lisinopril Cough        Medication List     STOP taking these medications    amLODipine 5 MG tablet Commonly known as: NORVASC   apixaban 5 MG Tabs tablet Commonly known as: Eliquis   hydrochlorothiazide 25 MG tablet Commonly known as: HYDRODIURIL   losartan 100 MG tablet Commonly known as: COZAAR   Namzaric 28-10 MG Cp24 Generic drug: Memantine HCl-Donepezil HCl   simvastatin 20 MG tablet Commonly known as: ZOCOR   tamsulosin 0.4 MG Caps capsule Commonly known as: FLOMAX       TAKE these medications    Accu-Chek Aviva device Use to test blood sugar three times daily. Dx E11.51   Accu-Chek Aviva Plus test strip Generic drug: glucose blood Check blood sugar  three times daily as directed E11.51   Accu-Chek Softclix Lancets lancets Use to check blood sugar three times daily. Dx: E11.51   acetaminophen 500 MG tablet Commonly known as: TYLENOL Take 500 mg by mouth every 6 (six) hours as needed for moderate pain.   albuterol 108 (90 Base) MCG/ACT inhaler Commonly known as: VENTOLIN HFA Inhale 2 puffs into the lungs every 4 (four) hours as needed for wheezing or shortness of breath. INHALE TWO PUFFS EVERY 4-6 HOURS AS NEEDED FOR DIFFICULTY BREATHING,WHEEZING OR COUGH   aspirin EC 81 MG tablet Take 1 tablet (81 mg total) by mouth daily.   B-D UF III MINI PEN NEEDLES 31G X 5 MM Misc Generic drug: Insulin Pen Needle Use to give insulin.   escitalopram 5 MG tablet Commonly known as: LEXAPRO TAKE 1 TABLET EVERY DAY   feeding supplement Liqd Take 237 mLs by mouth 2 (two) times daily between meals.   ferrous sulfate 220 (44 Fe) MG/5ML solution Take 220 mg by mouth daily.   levETIRAcetam 100 MG/ML solution Commonly known as: KEPPRA Take 2.5 mLs (250 mg total) by mouth 2 (two) times daily.   Loratadine 10 MG Caps Take 1 capsule (10 mg total) by mouth daily.   metoprolol tartrate 50 MG tablet Commonly known as: LOPRESSOR Take 25 mg by mouth 2 (two) times daily.   NON FORMULARY Take 1 Dose by mouth 2 (two) times daily. Mix 1 pack with 16 ounce of water.- wound care powder- not sure of name   [employer] Tabs Take 1 tablet by mouth daily.   Sure-Prep Alcohol Prep 70 % Pads 1 each by Does not apply route as needed.               Discharge Care Instructions  (From admission, onward)           Start     Ordered   05/01/21 0000  Discharge wound care:       Comments: 1.Cleanse sacral wound with NS and pat dry. Apply Aquacel ag to wound bed. [facility] # [id number]) Cover with sacral foam.  Change every three days, please date dressing.   2.Blistering to left hand, bilateral arms, thighs and left axilla:  Clean wounds with NS and  pat dry. Apply small piece of Mepitel silicone contact layer  to wound bed and cover with foam dressing.  Date dressing and change every three days (along with sacral wound)   05/01/21 1047  Follow-up Information     Primary care MD. Schedule an appointment as soon as possible for a visit in 1 week(s).   Why: Hospital follow up.  Please ask your primary care MD to follow skin biopsy results.               Allergies  Allergen Reactions   Lisinopril Cough      Consultations: PCCM, neurology   Other Procedures/Studies: CT Head Wo Contrast  Result Date: 04/22/2021 CLINICAL DATA:  Seizure, nontraumatic (Age >= 23y) EXAM: CT HEAD WITHOUT CONTRAST TECHNIQUE: Contiguous axial images were obtained from the base of the skull through the vertex without intravenous contrast. COMPARISON:  Brain MRI 07/04/2019, CT 06/01/2019 FINDINGS: Brain: No evidence of acute infarction, hemorrhage, hydrocephalus, extra-axial collection or mass lesion/mass effect. Generalized atrophy with mild progression from 2020 exam. Moderately advanced chronic small vessel ischemia. Remote left frontal infarct. Vascular: Vascular Skull: No fracture or focal lesion. Sinuses/Orbits: Chronic mucosal thickening throughout the paranasal sinuses. No sinus fluid levels. No mastoid effusion. Other: None. IMPRESSION: 1. No acute intracranial abnormality. 2. Generalized atrophy with mild progression from 2020 exam. Moderately advanced chronic small vessel ischemia. Remote left frontal infarct. Electronically Signed   By: Keith Rake M.D.   On: 04/22/2021 20:52   MR BRAIN WO CONTRAST  Result Date: 04/23/2021 CLINICAL DATA:  Seizure, abnormal neuro exam EXAM: MRI HEAD WITHOUT CONTRAST TECHNIQUE: Multiplanar, multiecho pulse sequences of the brain and surrounding structures were obtained without intravenous contrast. COMPARISON:  MRI June 04, 2019. FINDINGS: Brain: No acute infarction, hemorrhage, hydrocephalus,  extra-axial collection or mass lesion. Progressive moderate atrophy with ex vacuo ventricular dilation. The callosal angle is not acute and there is no crowding of sulci at the vertex. Progressive T2/FLAIR hyperintensity in the supratentorial and pontine white matter, nonspecific but compatible with chronic microvascular ischemic disease. Remote left frontal infarct with cystic encephalomalacia. Mild T2 hyperintensity in the lateral left temporal lobe, likely nonspecific gliosis and similar to prior. Vascular: Major arterial flow voids are maintained at the skull base. Skull and upper cervical spine: Normal marrow signal. Sinuses/Orbits: Opacified anterior right ethmoid air cells with moderate right maxillary sinus mucosal thickening. Otherwise, mild paranasal sinus mucosal thickening. No acute orbital findings. Other: Trace right mastoid effusion. IMPRESSION: 1. No evidence of acute intracranial abnormality. 2. Moderate atrophy and chronic microvascular ischemic disease, progressed from 2020. 3. Remote left frontal infarct. Electronically Signed   By: Margaretha Sheffield M.D.   On: 04/23/2021 13:11   DG CHEST PORT 1 VIEW  Result Date: 04/25/2021 CLINICAL DATA:  Shortness of breath EXAM: PORTABLE CHEST 1 VIEW COMPARISON:  April 23, 2021 FINDINGS: Evaluation is limited secondary to patient rotation. The cardiomediastinal silhouette is unchanged and enlarged in contour.Removal of enteric tube. Extubation. Likely small RIGHT pleural effusion. No pneumothorax. Asymmetric RIGHT apical pleural thickening, similar in comparison to prior. Bandlike opacity along the minor fissure may reflect a combination of pleural fluid and airspace disease. LEFT basilar airspace opacities, predominantly linear in orientation. These are mildly improved in comparison to prior. Gaseous distension of loops of bowel. IMPRESSION: 1. Scattered bilateral airspace opacities with differential considerations including atelectasis, infection or  aspiration. Overall, these appear mildly improved in comparison to most recent prior. 2. Likely small RIGHT pleural effusion. Electronically Signed   By: Valentino Saxon M.D.   On: 04/25/2021 12:49   DG Chest Port 1 View  Result Date: 04/23/2021 CLINICAL DATA:  Endotracheal tube. EXAM: PORTABLE CHEST 1 VIEW COMPARISON:  April 22, 2021.  FINDINGS: Stable cardiomediastinal silhouette. Endotracheal and nasogastric tubes are unchanged in position. No pneumothorax is noted. Increased bibasilar opacities are noted, left greater than right, concerning for pneumonia or possibly atelectasis. Left pleural effusion may be present. Bony thorax is unremarkable. IMPRESSION: Stable support apparatus. Increased bibasilar opacities are noted, left greater than right, concerning for pneumonia or possibly atelectasis. Electronically Signed   By: Marijo Conception M.D.   On: 04/23/2021 08:18   DG Chest Portable 1 View  Result Date: 04/22/2021 CLINICAL DATA:  Status post intubation EXAM: PORTABLE CHEST 1 VIEW COMPARISON:  07/05/2019 FINDINGS: Endotracheal tube is now seen 3.8 cm above the carina. Gastric catheter is noted coiled within the stomach. Cardiac shadow is stable. Aortic calcifications are noted. The left lung demonstrates mild basilar scarring. Thickening of the minor fissure on the right is noted without definitive infiltrate. No other focal abnormality is noted. IMPRESSION: Tubes and lines as described above. Chronic changes in the lungs bilaterally. Electronically Signed   By: Inez Catalina M.D.   On: 04/22/2021 18:04   DG Swallowing Func-Speech Pathology  Result Date: 04/30/2021 Table formatting from the original result was not included. Objective Swallowing Evaluation: Type of Study: MBS-Modified Barium Swallow Study  Patient Details Name: [name] MRN: [id number] Date of Birth: October 04, 1921 Today's Date: 04/30/2021 Time: SLP Start Time (ACUTE ONLY): 0913 -SLP Stop Time (ACUTE ONLY): 0933 SLP Time  Calculation (min) (ACUTE ONLY): 20 min Past Medical History: Past Medical History: Diagnosis Date  Arthritis   CAD (coronary artery disease)   s/p DES to LAD 06/2013  Dementia with behavioral disturbance (HCC)   Depression   Dyslipidemia   Glaucoma   Hyperglycemia 04/17/2014  Hyperlipidemia   Hypertension   Stroke ([place])   Type 2 diabetes mellitus ([place])  Past Surgical History: Past Surgical History: Procedure Laterality Date  CARDIAC CATHETERIZATION  06/2013  DES to LAD by Dr Tamala Julian  Right Cataract   HPI: This 87 y.o. African American male  presented to the [facility] Emergency Department via EMS on 9/22 with complaints of new onset seizure disorder.  The patient was intubated in the ER for airway protection. Extubated on 9/23. PMH: CAD with DES 2014, HTN, HLD, Dementia, Stroke, DMII, stage 4 PI, chronic sacral ulcer getting wound care  Subjective: alert, speech severly dysarthric, able to follow commands to open mouth for PO's Assessment / Plan / Recommendation CHL IP CLINICAL IMPRESSIONS 04/30/2021 Clinical Impression Pt presented with oropharyngeal dysphagia characterized by impaired bolus awareness, impaired mastication, weak lingual manipulation, reduced bolus cohesion, reduced lingual retraction, reduced anterior laryngeal movement, and a pharyngeal delay. He demonstrated prolonged mastication of dysphagia 2 solids, oral holding, oral residue, premature spillage to the pyriform sinuses, vallecular residue, and pyriform sinus residue. Pt demonstrated trace silent aspiration (PAS 8) with thin liquids, and moderate aspiration (PAS 7) was noted once with puree. Aspiration of puree triggered prolonged vocalization and subsequent coughing which was ineffective in expelling the aspirate. Laryngeal invasion was secondary to premature spillage, the pharyngeal delay and pooling/residue in the pyriform sinuses. Considering the consistency of these factors, intermittent aspiration throughout meals is  likely. Pt's daughter was contacted by this SLP and educated regarding the results of the study and SLP's concerns. SLP attempted to determine her wishes regarding non-oral alimentation vs continuing p.o. intake for comfort with known aspiration risk. Pt's daughter expressed that the pt is rarely symptomatic at home and that she is unsure of her desire at this time. Further discussions with  PMT may be beneficial to assist family with [place] decisions.  Pt's current diet of dysphagia 1 solids and nectar thick liquids appears to present a reduced aspiration risk if swallowing precautions are strictly observed. SLP will continue to follow pt. SLP Visit Diagnosis Dysphagia, oropharyngeal phase (R13.12) Attention and concentration deficit following -- Frontal lobe and executive function deficit following -- Impact on safety and function Moderate aspiration risk;Severe aspiration risk   CHL IP TREATMENT RECOMMENDATION 04/30/2021 Treatment Recommendations Therapy as outlined in treatment plan below   Prognosis 04/30/2021 Prognosis for Safe Diet Advancement Fair Barriers to Reach Goals Time post onset;Cognitive deficits;Severity of deficits Barriers/Prognosis Comment -- CHL IP DIET RECOMMENDATION 04/30/2021 SLP Diet Recommendations Dysphagia 1 (Puree) solids;Nectar thick liquid Liquid Administration via Cup;Straw Medication Administration Crushed with puree Compensations Slow rate;Small sips/bites;Minimize environmental distractions Postural Changes Seated upright at 90 degrees   CHL IP OTHER RECOMMENDATIONS 07/07/2019 Recommended Consults -- Oral Care Recommendations -- Other Recommendations Have oral suction available   CHL IP FOLLOW UP RECOMMENDATIONS 04/30/2021 Follow up Recommendations (No Data)   CHL IP FREQUENCY AND DURATION 04/30/2021 Speech Therapy Frequency (ACUTE ONLY) min 2x/week Treatment Duration 2 weeks      CHL IP ORAL PHASE 04/30/2021 Oral Phase Impaired Oral - Pudding Teaspoon -- Oral - Pudding Cup -- Oral - Honey  Teaspoon -- Oral - Honey Cup -- Oral - Nectar Teaspoon -- Oral - Nectar Cup -- Oral - Nectar Straw Holding of bolus;Lingual/palatal residue;Decreased bolus cohesion;Premature spillage;Weak lingual manipulation Oral - Thin Teaspoon -- Oral - Thin Cup -- Oral - Thin Straw Holding of bolus;Lingual/palatal residue;Decreased bolus cohesion;Premature spillage;Weak lingual manipulation Oral - Puree Holding of bolus;Lingual/palatal residue;Decreased bolus cohesion;Premature spillage;Weak lingual manipulation Oral - Mech Soft Holding of bolus;Lingual/palatal residue;Decreased bolus cohesion;Premature spillage;Weak lingual manipulation;Impaired mastication Oral - Regular -- Oral - Multi-Consistency -- Oral - Pill -- Oral Phase - Comment --  CHL IP PHARYNGEAL PHASE 04/30/2021 Pharyngeal Phase Impaired Pharyngeal- Pudding Teaspoon -- Pharyngeal -- Pharyngeal- Pudding Cup -- Pharyngeal -- Pharyngeal- Honey Teaspoon -- Pharyngeal -- Pharyngeal- Honey Cup -- Pharyngeal -- Pharyngeal- Nectar Teaspoon -- Pharyngeal -- Pharyngeal- Nectar Cup -- Pharyngeal -- Pharyngeal- Nectar Straw Reduced anterior laryngeal mobility;Delayed swallow initiation-pyriform sinuses;Pharyngeal residue - valleculae;Pharyngeal residue - pyriform;Reduced tongue base retraction Pharyngeal -- Pharyngeal- Thin Teaspoon -- Pharyngeal -- Pharyngeal- Thin Cup -- Pharyngeal -- Pharyngeal- Thin Straw Reduced anterior laryngeal mobility;Delayed swallow initiation-pyriform sinuses;Pharyngeal residue - valleculae;Pharyngeal residue - pyriform;Reduced tongue base retraction;Penetration/Apiration after swallow Pharyngeal Material enters airway, passes BELOW cords without attempt by patient to eject out (silent aspiration) Pharyngeal- Puree Reduced anterior laryngeal mobility;Delayed swallow initiation-pyriform sinuses;Pharyngeal residue - valleculae;Pharyngeal residue - pyriform;Reduced tongue base retraction;Penetration/Aspiration during swallow Pharyngeal Material  enters airway, passes BELOW cords and not ejected out despite cough attempt by patient Pharyngeal- Mechanical Soft Reduced anterior laryngeal mobility;Delayed swallow initiation-pyriform sinuses;Pharyngeal residue - valleculae;Pharyngeal residue - pyriform;Reduced tongue base retraction Pharyngeal -- Pharyngeal- Regular -- Pharyngeal -- Pharyngeal- Multi-consistency -- Pharyngeal -- Pharyngeal- Pill -- Pharyngeal -- Pharyngeal Comment --  CHL IP CERVICAL ESOPHAGEAL PHASE 04/30/2021 Cervical Esophageal Phase WFL Pudding Teaspoon -- Pudding Cup -- Honey Teaspoon -- Honey Cup -- Nectar Teaspoon -- Nectar Cup -- Nectar Straw -- Thin Teaspoon -- Thin Cup -- Thin Straw -- Puree -- Mechanical Soft -- Regular -- Multi-consistency -- Pill -- Cervical Esophageal Comment -- Shanika I. Hardin Negus, [place], [place] Office number [phone] Pager [place] 04/30/2021, 12:08 PM  EEG adult  Result Date: 04/22/2021 [name] Jack, MD     04/22/2021  7:13 PM Routine EEG Report [name] is a 87 y.o. male with a history of seizures who is undergoing an EEG to evaluate for seizures. Report: This EEG was acquired with electrodes placed according to the International 10-20 electrode system (including Fp1, Fp2, F3, F4, C3, C4, P3, P4, O1, O2, T3, T4, T5, T6, A1, A2, Fz, Cz, Pz). The following electrodes were missing or displaced: none. The occipital dominant rhythm was 3-4 Hz with overriding beta. This activity is reactive to stimulation. Sleep architecture was not identified. There was no focal slowing. There were no interictal epileptiform discharges. There were no electrographic seizures identified. Photic stimulation and hyperventilation were not performed. Impression and clinical corrrelation: This EEG was obtained while sedated and is abnormal due to severe diffuse slowing indicating a global cerebral dysfunction. Su Monks, MD [facility] [phone]  If 7pm- 7am, please page neurology on call as listed in [place].   Overnight EEG with video  Result Date: 04/23/2021 Lora Havens, MD     04/24/2021  9:23 AM Patient Name: [name] MRN: [id number] Epilepsy Attending: Lora Havens Referring Physician/Provider: Dr Amie [place] Duration: 04/22/2021 1857 to 04/23/2021 1157 Patient history: a 87 year old with no prior history of seizures, other medical history as documented above with acute onset of right face and arm twitching requiring multiple doses of benzodiazepine eventually requiring intubation due to inability protect airway. EEG to evaluate for seizures. Level of alertness: comatose AEDs during EEG study: Ativan Technical aspects: This EEG study was done with scalp electrodes positioned according to the 10-20 International system of electrode placement. Electrical activity was acquired at a sampling rate of 500Hz  and reviewed with a high frequency filter of 70Hz  and a low frequency filter of 1Hz . EEG data were recorded continuously and digitally stored. Description: EEG showed continuous generalized low amplitude 3 -4 Hz delta slowing. Hyperventilation and photic stimulation were not performed.   ABNORMALITY - Continuous slow, generalized IMPRESSION: This study is suggestive of severe diffuse encephalopathy, nonspecific etiology. No seizures or epileptiform discharges were seen throughout the recording. Priyanka O Yadav   VAS [place] UPPER EXTREMITY VENOUS DUPLEX  Result Date: 04/25/2021 UPPER VENOUS STUDY  Patient Name:  [name]  Date of Exam:   04/25/2021 Medical Rec #: [id number]      Accession #:    [id number] Date of Birth: 03-25-1922       Patient Gender: M Patient Age:   66 years Exam Location:  [facility] Procedure:      VAS [place] UPPER EXTREMITY VENOUS DUPLEX Referring Phys: PRANAV PATEL --------------------------------------------------------------------------------  Indications: Edema Limitations: Restricted mobility, patient unable  to cooperate. Comparison Study: No prior study Performing Technologist: Maudry Mayhew MHA, RDMS, RVT, RDCS  Examination Guidelines: A complete evaluation includes B-mode imaging, spectral Doppler, color Doppler, and power Doppler as needed of all accessible portions of each vessel. Bilateral testing is considered an integral part of a complete examination. Limited examinations for reoccurring indications may be performed as noted.  Right Findings: +----------+------------+---------+-----------+----------+-------+ RIGHT     CompressiblePhasicitySpontaneousPropertiesSummary +----------+------------+---------+-----------+----------+-------+ Subclavian               Yes       Yes                      +----------+------------+---------+-----------+----------+-------+  Left Findings: +----------+------------+---------+-----------+----------+--------------+ LEFT      CompressiblePhasicitySpontaneousProperties   Summary     +----------+------------+---------+-----------+----------+--------------+  IJV           Full       Yes       Yes                             +----------+------------+---------+-----------+----------+--------------+ Subclavian    Full       Yes       Yes                             +----------+------------+---------+-----------+----------+--------------+ Axillary      Full       Yes       Yes                             +----------+------------+---------+-----------+----------+--------------+ Brachial      Full       Yes       Yes                             +----------+------------+---------+-----------+----------+--------------+ Radial        Full                                                 +----------+------------+---------+-----------+----------+--------------+ Ulnar                                               Not visualized +----------+------------+---------+-----------+----------+--------------+ Cephalic      Full                                                  +----------+------------+---------+-----------+----------+--------------+ Basilic                                             Not visualized +----------+------------+---------+-----------+----------+--------------+  Summary:  Right: No evidence of thrombosis in the subclavian.  Left: No evidence of deep vein thrombosis in the upper extremity. No evidence of superficial vein thrombosis in the upper extremity.  *See table(s) above for measurements and observations.  Diagnosing physician: Servando Snare MD Electronically signed by Servando Snare MD on 04/25/2021 at 5:26:35 PM.    Final      TODAY-DAY OF DISCHARGE:  Subjective:   Domenick Gong today has no headache,no chest abdominal pain,no new weakness tingling or numbness  Objective:   Blood pressure (!) 136/46, pulse 77, temperature 98.1 F (36.7 C), temperature source Axillary, resp. rate 20, height 6' (1.829 m), weight 72.4 kg, SpO2 100 %.  Intake/Output Summary (Last 24 hours) at 05/01/2021 1050 Last data filed at 05/01/2021 0907 Gross per 24 hour  Intake 861.92 ml  Output 2300 ml  Net -1438.08 ml   Filed Weights   04/26/21 0500 04/28/21 0236 04/30/21 0422  Weight: 67.9 kg 69 kg 72.4 kg    Exam: Awake Alert,  No new F.N deficits, Normal affect [place].AT,PERRAL  Supple Neck,No JVD, No cervical lymphadenopathy appriciated.  Symmetrical Chest wall movement, Good air movement bilaterally, CTAB RRR,No Gallops,Rubs or new Murmurs, No Parasternal Heave +ve B.Sounds, Abd Soft, Non tender, No organomegaly appriciated, No rebound -guarding or rigidity. No Cyanosis, Clubbing or edema, No new Rash or bruise   PERTINENT RADIOLOGIC STUDIES: DG Swallowing Func-Speech Pathology  Result Date: 04/30/2021 Table formatting from the original result was not included. Objective Swallowing Evaluation: Type of Study: MBS-Modified Barium Swallow Study  Patient Details Name: [name] MRN: [id number] Date of  Birth: 1921/10/03 Today's Date: 04/30/2021 Time: SLP Start Time (ACUTE ONLY): 0913 -SLP Stop Time (ACUTE ONLY): 0933 SLP Time Calculation (min) (ACUTE ONLY): 20 min Past Medical History: Past Medical History: Diagnosis Date  Arthritis   CAD (coronary artery disease)   s/p DES to LAD 06/2013  Dementia with behavioral disturbance (HCC)   Depression   Dyslipidemia   Glaucoma   Hyperglycemia 04/17/2014  Hyperlipidemia   Hypertension   Stroke ([place])   Type 2 diabetes mellitus ([place])  Past Surgical History: Past Surgical History: Procedure Laterality Date  CARDIAC CATHETERIZATION  06/2013  DES to LAD by Dr Tamala Julian  Right Cataract   HPI: This 87 y.o. African American male  presented to the [facility] Emergency Department via EMS on 9/22 with complaints of new onset seizure disorder.  The patient was intubated in the ER for airway protection. Extubated on 9/23. PMH: CAD with DES 2014, HTN, HLD, Dementia, Stroke, DMII, stage 4 PI, chronic sacral ulcer getting wound care  Subjective: alert, speech severly dysarthric, able to follow commands to open mouth for PO's Assessment / Plan / Recommendation CHL IP CLINICAL IMPRESSIONS 04/30/2021 Clinical Impression Pt presented with oropharyngeal dysphagia characterized by impaired bolus awareness, impaired mastication, weak lingual manipulation, reduced bolus cohesion, reduced lingual retraction, reduced anterior laryngeal movement, and a pharyngeal delay. He demonstrated prolonged mastication of dysphagia 2 solids, oral holding, oral residue, premature spillage to the pyriform sinuses, vallecular residue, and pyriform sinus residue. Pt demonstrated trace silent aspiration (PAS 8) with thin liquids, and moderate aspiration (PAS 7) was noted once with puree. Aspiration of puree triggered prolonged vocalization and subsequent coughing which was ineffective in expelling the aspirate. Laryngeal invasion was secondary to premature spillage, the pharyngeal delay and  pooling/residue in the pyriform sinuses. Considering the consistency of these factors, intermittent aspiration throughout meals is likely. Pt's daughter was contacted by this SLP and educated regarding the results of the study and SLP's concerns. SLP attempted to determine her wishes regarding non-oral alimentation vs continuing p.o. intake for comfort with known aspiration risk. Pt's daughter expressed that the pt is rarely symptomatic at home and that she is unsure of her desire at this time. Further discussions with PMT may be beneficial to assist family with [place] decisions.  Pt's current diet of dysphagia 1 solids and nectar thick liquids appears to present a reduced aspiration risk if swallowing precautions are strictly observed. SLP will continue to follow pt. SLP Visit Diagnosis Dysphagia, oropharyngeal phase (R13.12) Attention and concentration deficit following -- Frontal lobe and executive function deficit following -- Impact on safety and function Moderate aspiration risk;Severe aspiration risk   CHL IP TREATMENT RECOMMENDATION 04/30/2021 Treatment Recommendations Therapy as outlined in treatment plan below   Prognosis 04/30/2021 Prognosis for Safe Diet Advancement Fair Barriers to Reach Goals Time post onset;Cognitive deficits;Severity of deficits Barriers/Prognosis Comment -- CHL IP DIET RECOMMENDATION 04/30/2021 SLP Diet Recommendations Dysphagia 1 (Puree) solids;Nectar thick liquid Liquid Administration via  Cup;Straw Medication Administration Crushed with puree Compensations Slow rate;Small sips/bites;Minimize environmental distractions Postural Changes Seated upright at 90 degrees   CHL IP OTHER RECOMMENDATIONS 07/07/2019 Recommended Consults -- Oral Care Recommendations -- Other Recommendations Have oral suction available   CHL IP FOLLOW UP RECOMMENDATIONS 04/30/2021 Follow up Recommendations (No Data)   CHL IP FREQUENCY AND DURATION 04/30/2021 Speech Therapy Frequency (ACUTE ONLY) min 2x/week Treatment  Duration 2 weeks      CHL IP ORAL PHASE 04/30/2021 Oral Phase Impaired Oral - Pudding Teaspoon -- Oral - Pudding Cup -- Oral - Honey Teaspoon -- Oral - Honey Cup -- Oral - Nectar Teaspoon -- Oral - Nectar Cup -- Oral - Nectar Straw Holding of bolus;Lingual/palatal residue;Decreased bolus cohesion;Premature spillage;Weak lingual manipulation Oral - Thin Teaspoon -- Oral - Thin Cup -- Oral - Thin Straw Holding of bolus;Lingual/palatal residue;Decreased bolus cohesion;Premature spillage;Weak lingual manipulation Oral - Puree Holding of bolus;Lingual/palatal residue;Decreased bolus cohesion;Premature spillage;Weak lingual manipulation Oral - Mech Soft Holding of bolus;Lingual/palatal residue;Decreased bolus cohesion;Premature spillage;Weak lingual manipulation;Impaired mastication Oral - Regular -- Oral - Multi-Consistency -- Oral - Pill -- Oral Phase - Comment --  CHL IP PHARYNGEAL PHASE 04/30/2021 Pharyngeal Phase Impaired Pharyngeal- Pudding Teaspoon -- Pharyngeal -- Pharyngeal- Pudding Cup -- Pharyngeal -- Pharyngeal- Honey Teaspoon -- Pharyngeal -- Pharyngeal- Honey Cup -- Pharyngeal -- Pharyngeal- Nectar Teaspoon -- Pharyngeal -- Pharyngeal- Nectar Cup -- Pharyngeal -- Pharyngeal- Nectar Straw Reduced anterior laryngeal mobility;Delayed swallow initiation-pyriform sinuses;Pharyngeal residue - valleculae;Pharyngeal residue - pyriform;Reduced tongue base retraction Pharyngeal -- Pharyngeal- Thin Teaspoon -- Pharyngeal -- Pharyngeal- Thin Cup -- Pharyngeal -- Pharyngeal- Thin Straw Reduced anterior laryngeal mobility;Delayed swallow initiation-pyriform sinuses;Pharyngeal residue - valleculae;Pharyngeal residue - pyriform;Reduced tongue base retraction;Penetration/Apiration after swallow Pharyngeal Material enters airway, passes BELOW cords without attempt by patient to eject out (silent aspiration) Pharyngeal- Puree Reduced anterior laryngeal mobility;Delayed swallow initiation-pyriform sinuses;Pharyngeal residue -  valleculae;Pharyngeal residue - pyriform;Reduced tongue base retraction;Penetration/Aspiration during swallow Pharyngeal Material enters airway, passes BELOW cords and not ejected out despite cough attempt by patient Pharyngeal- Mechanical Soft Reduced anterior laryngeal mobility;Delayed swallow initiation-pyriform sinuses;Pharyngeal residue - valleculae;Pharyngeal residue - pyriform;Reduced tongue base retraction Pharyngeal -- Pharyngeal- Regular -- Pharyngeal -- Pharyngeal- Multi-consistency -- Pharyngeal -- Pharyngeal- Pill -- Pharyngeal -- Pharyngeal Comment --  CHL IP CERVICAL ESOPHAGEAL PHASE 04/30/2021 Cervical Esophageal Phase WFL Pudding Teaspoon -- Pudding Cup -- Honey Teaspoon -- Honey Cup -- Nectar Teaspoon -- Nectar Cup -- Nectar Straw -- Thin Teaspoon -- Thin Cup -- Thin Straw -- Puree -- Mechanical Soft -- Regular -- Multi-consistency -- Pill -- Cervical Esophageal Comment -- Shanika I. Hardin Negus, [place], [place] Office number [phone] Pager [phone] [place] Marshall 04/30/2021, 12:08 PM                PERTINENT LAB RESULTS: CBC: Recent Labs    04/30/21 0233 04/30/21 1724 05/01/21 0050  WBC 8.3  --  9.0  HGB 6.8* 8.1* 8.1*  HCT 21.8* 25.3* 25.4*  PLT 234  --  246   CMET CMP     Component Value Date/Time   NA 142 04/30/2021 0233   NA 142 11/22/2019 0000   K 4.3 04/30/2021 0233   CL 112 (H) 04/30/2021 0233   CO2 26 04/30/2021 0233   GLUCOSE 146 (H) 04/30/2021 0233   BUN 8 04/30/2021 0233   BUN 22 (A) 11/22/2019 0000   CREATININE 1.12 04/30/2021 0233   CREATININE 1.63 (H) 12/01/2018 1032   CALCIUM 8.8 (L) 04/30/2021 0233   PROT 6.2 (  L) 04/25/2021 0635   ALBUMIN 2.3 (L) 04/26/2021 0528   AST 33 04/25/2021 0635   ALT 23 04/25/2021 0635   ALKPHOS 57 04/25/2021 0635   BILITOT 0.3 04/25/2021 0635   GFRNONAA 60 (L) 04/30/2021 0233   GFRNONAA 36 (L) 12/01/2018 1032   GFRAA >60 12/07/2019 0558   GFRAA 41 (L) 12/01/2018 1032     GFR Estimated Creatinine Clearance: 38.6 mL/min (by C-G formula based on SCr of 1.12 mg/dL). No results for input(s): LIPASE, AMYLASE in the last 72 hours. No results for input(s): CKTOTAL, CKMB, CKMBINDEX, TROPONINI in the last 72 hours. Invalid input(s): POCBNP No results for input(s): DDIMER in the last 72 hours. No results for input(s): HGBA1C in the last 72 hours. No results for input(s): CHOL, HDL, LDLCALC, TRIG, CHOLHDL, LDLDIRECT in the last 72 hours. No results for input(s): TSH, T4TOTAL, T3FREE, THYROIDAB in the last 72 hours.  Invalid input(s): FREET3 No results for input(s): VITAMINB12, FOLATE, FERRITIN, TIBC, IRON, RETICCTPCT in the last 72 hours. Coags: No results for input(s): INR in the last 72 hours.  Invalid input(s): PT Microbiology: Recent Results (from the past 240 hour(s))  Blood culture (routine x 2)     Status: None   Collection Time: 04/22/21  4:24 PM   Specimen: BLOOD  Result Value Ref Range Status   Specimen Description BLOOD BLOOD RIGHT HAND  Final   Special Requests   Final    BOTTLES DRAWN AEROBIC AND ANAEROBIC Blood Culture adequate volume   Culture   Final    NO GROWTH 5 DAYS Performed at [facility], [address], [place], [place] [address]    Report Status 04/27/2021 FINAL  Final  Blood culture (routine x 2)     Status: None   Collection Time: 04/22/21  4:29 PM   Specimen: BLOOD  Result Value Ref Range Status   Specimen Description BLOOD LEFT ANTECUBITAL  Final   Special Requests   Final    BOTTLES DRAWN AEROBIC AND ANAEROBIC Blood Culture results may not be optimal due to an inadequate volume of blood received in culture bottles   Culture   Final    NO GROWTH 5 DAYS Performed at [facility], [place] [address] [place], [place] [address]    Report Status 04/27/2021 FINAL  Final  Resp Panel by RT-PCR (Flu A&B, Covid) Nasopharyngeal Swab     Status: None   Collection Time: 04/22/21  5:47 PM   Specimen: Nasopharyngeal Swab;  Nasopharyngeal(NP) swabs in vial transport medium  Result Value Ref Range Status   SARS Coronavirus 2 by RT PCR NEGATIVE NEGATIVE Final    Comment: (NOTE) SARS-CoV-2 target nucleic acids are NOT DETECTED.  The SARS-CoV-2 RNA is generally detectable in upper respiratory specimens during the acute phase of infection. The lowest concentration of SARS-CoV-2 viral copies this assay can detect is 138 copies/mL. A negative result does not preclude SARS-Cov-2 infection and should not be used as the sole basis for treatment or other patient management decisions. A negative result may occur with  improper specimen collection/handling, submission of specimen other than nasopharyngeal swab, presence of viral mutation(s) within the areas targeted by this assay, and inadequate number of viral copies(<138 copies/mL). A negative result must be combined with clinical observations, patient history, and epidemiological information. The expected result is Negative.  Fact Sheet for Patients:  EntrepreneurPulse.com.au  Fact Sheet for Healthcare Providers:  IncredibleEmployment.be  This test is no t yet approved or cleared by the [place]  States FDA and  has been authorized for detection and/or diagnosis of SARS-CoV-2 by FDA under an Emergency Use Authorization (EUA). This EUA will remain  in effect (meaning this test can be used) for the duration of the COVID-19 declaration under Section 564(b)(1) of the Act, 21 U.S.C.section 360bbb-3(b)(1), unless the authorization is terminated  or revoked sooner.       Influenza A by PCR NEGATIVE NEGATIVE Final   Influenza B by PCR NEGATIVE NEGATIVE Final    Comment: (NOTE) The Xpert Xpress SARS-CoV-2/FLU/RSV plus assay is intended as an aid in the diagnosis of influenza from Nasopharyngeal swab specimens and should not be used as a sole basis for treatment. Nasal washings and aspirates are unacceptable for Xpert Xpress  SARS-CoV-2/FLU/RSV testing.  Fact Sheet for Patients: EntrepreneurPulse.com.au  Fact Sheet for Healthcare Providers: IncredibleEmployment.be  This test is not yet approved or cleared by the [place] FDA and has been authorized for detection and/or diagnosis of SARS-CoV-2 by FDA under an Emergency Use Authorization (EUA). This EUA will remain in effect (meaning this test can be used) for the duration of the COVID-19 declaration under Section 564(b)(1) of the Act, 21 U.S.C. section 360bbb-3(b)(1), unless the authorization is terminated or revoked.  Performed at [facility], [place] [address], [place], [place] [address]   CSF culture     Status: None   Collection Time: 04/22/21 10:02 PM   Specimen: Back; Cerebrospinal Fluid  Result Value Ref Range Status   Specimen Description BACK  Final   Special Requests NONE  Final   Culture   Final    NO GROWTH 3 DAYS Performed at [facility] [address] [place], [place] [address]    Report Status 04/26/2021 FINAL  Final  Gram stain     Status: None   Collection Time: 04/22/21 10:02 PM   Specimen: CSF; Cerebrospinal Fluid  Result Value Ref Range Status   Specimen Description CSF  Final   Special Requests NONE  Final   Gram Stain   Final    NO ORGANISMS SEEN NO WBC SEEN CYTOSPIN SMEAR Performed at [facility], [place] [address] [place], [place] [address]    Report Status 04/22/2021 FINAL  Final  MRSA Next Gen by PCR, Nasal     Status: None   Collection Time: 04/23/21  2:17 AM   Specimen: Nasal Mucosa; Nasal Swab  Result Value Ref Range Status   MRSA by PCR Next Gen NOT DETECTED NOT DETECTED Final    Comment: (NOTE) The GeneXpert MRSA Assay (FDA approved for NASAL specimens only), is one component of a comprehensive MRSA colonization surveillance program. It is not intended to diagnose MRSA infection nor to guide or monitor treatment for MRSA infections. Test  performance is not FDA approved in patients less than 25 years old. Performed at [facility], [place] [address], [place], [place] [address]     FURTHER DISCHARGE INSTRUCTIONS:  Get Medicines reviewed and adjusted: Please take all your medications with you for your next visit with your Primary MD  Laboratory/radiological data: Please request your Primary MD to go over all hospital tests and procedure/radiological results at the follow up, please ask your Primary MD to get all Hospital records sent to his/her office.  In some cases, they will be blood work, cultures and biopsy results pending at the time of your discharge. Please request that your primary care M.D. goes through all the records of your hospital data and follows  up on these results.  Also Note the following: If you experience worsening of your admission symptoms, develop shortness of breath, life threatening emergency, suicidal or homicidal thoughts you must seek medical attention immediately by calling 911 or calling your MD immediately  if symptoms less severe.  You must read complete instructions/literature along with all the possible adverse reactions/side effects for all the Medicines you take and that have been prescribed to you. Take any new Medicines after you have completely understood and accpet all the possible adverse reactions/side effects.   Do not drive when taking Pain medications or sleeping medications (Benzodaizepines)  Do not take more than prescribed Pain, Sleep and Anxiety Medications. It is not advisable to combine anxiety,sleep and pain medications without talking with your primary care practitioner  Special Instructions: If you have smoked or chewed Tobacco  in the last 2 yrs please stop smoking, stop any regular Alcohol  and or any Recreational drug use.  Wear Seat belts while driving.  Please note: You were cared for by a hospitalist during your hospital stay. Once you are discharged, your  primary care physician will handle any further medical issues. Please note that NO REFILLS for any discharge medications will be authorized once you are discharged, as it is imperative that you return to your primary care physician (or establish a relationship with a primary care physician if you do not have one) for your post hospital discharge needs so that they can reassess your need for medications and monitor your lab values.  Total Time spent coordinating discharge including counseling, education and face to face time equals 35 minutes.  Signed: Emauri Krygier 05/01/2021 10:50 AM

## 2021-05-01 NOTE — TOC Transition Note (Signed)
Transition of Care [facility]) - CM/SW Discharge Note   Patient Details  Name: Bingham Millette MRN: [id number] Date of Birth: 09-Sep-1921  Transition of Care [facility]) CM/SW Contact:  Benard Halsted, LCSW Phone Number: 05/01/2021, 12:04 PM   Clinical Narrative:    CSW spoke with patient's daughter [name] and she confirmed patient's address and stated she will be there to receive patient. CSW added patient to [facility] transport list.      Barriers to Discharge: Transportation   Patient Goals and CMS Choice Patient states their goals for this hospitalization and ongoing recovery are:: To go home      Discharge Placement                       Discharge Plan and Services   Discharge Planning Services: CM Consult                      HH Arranged: RN, PT, OT, Speech Therapy HH Agency: [place] ([place]) Date HH Agency Contacted: 05/01/21 Time HH Agency Contacted: [id number] Representative spoke with at [place]: [place] ([place]) Interventions     Readmission Risk Interventions Readmission Risk Prevention Plan 12/07/2019  Transportation Screening Complete  PCP or Specialist Appt within 3-5 Days Complete  HRI or [place] Complete  Social Work Consult for [place] Planning/Counseling Complete  Palliative Care Screening Complete  Medication Review Press photographer) Complete  Some recent data might be hidden

## 2021-05-02 LAB — GLUCOSE, CAPILLARY
Glucose-Capillary: 161 mg/dL — ABNORMAL HIGH (ref 70–99)
Glucose-Capillary: 158 mg/dL — ABNORMAL HIGH (ref 70–99)
Glucose-Capillary: 168 mg/dL — ABNORMAL HIGH (ref 70–99)

## 2021-05-02 NOTE — Care Management (Signed)
[facility] called this AM is active with him under [employer] benefits. They will serve him for RN and Speech Seth Bake from  [employer]

## 2021-05-02 NOTE — Progress Notes (Signed)
No major issues overnight-could not be discharged yesterday due to transportation issues.  Remains stable for discharge.

## 2021-05-04 ENCOUNTER — Other Ambulatory Visit: Payer: Self-pay

## 2021-05-04 LAB — ARBOVIRUS IGG, CSF
California Enceph IgG: <1:1 {titer}
Eastern eq encephalitis, IgG: <1:1 {titer}
St Louis encephalitis, IgG: <1:1 {titer}
West Nile IgG CSF: 0.73 IV (ref <1.30)
Western eq encephalitis, IgG: <1:1 {titer}

## 2021-05-04 NOTE — Care Management (Signed)
05/04/21 11:54am    Case manager contacted because patient's suction machine is not working. CM will contact [employer] and request that someone reach out to patient's daughter, Saifullah Jolley [phone].   Ricki Miller, RN BSN CM

## 2021-05-04 NOTE — Patient Outreach (Signed)
[place] [facility]) Care Management  05/04/2021  [name] [birth year] [id number]   Referral Date: 05/01/21 Referral Source: Hospital liaison Date of Discharge: 05/02/21  Facility: [place]: [facility]  Patient recently discharged from the hospital.  Patient is active with the [place] [facility] program, where nurse visits periodically and has active plan of care.   This is duplication of services.    Plan: RN CM will close case.     Jone Baseman, RN, MSN [facility] Care Management Care Management Coordinator Direct Line [phone] Toll Free: [phone]  Fax: [phone]

## 2021-05-14 ENCOUNTER — Encounter (HOSPITAL_BASED_OUTPATIENT_CLINIC_OR_DEPARTMENT_OTHER): Payer: Medicare HMO | Admitting: Internal Medicine

## 2021-05-14 DIAGNOSIS — R6889 Other general symptoms and signs: Secondary | ICD-10-CM | POA: Diagnosis not present

## 2021-05-18 ENCOUNTER — Encounter (HOSPITAL_BASED_OUTPATIENT_CLINIC_OR_DEPARTMENT_OTHER): Payer: Medicare HMO | Attending: Internal Medicine | Admitting: Internal Medicine

## 2021-05-18 ENCOUNTER — Other Ambulatory Visit: Payer: Self-pay

## 2021-05-18 DIAGNOSIS — M8618 Other acute osteomyelitis, other site: Secondary | ICD-10-CM | POA: Diagnosis not present

## 2021-05-18 DIAGNOSIS — L89154 Pressure ulcer of sacral region, stage 4: Secondary | ICD-10-CM | POA: Diagnosis not present

## 2021-05-18 DIAGNOSIS — G40509 Epileptic seizures related to external causes, not intractable, without status epilepticus: Secondary | ICD-10-CM | POA: Diagnosis not present

## 2021-05-18 DIAGNOSIS — L98492 Non-pressure chronic ulcer of skin of other sites with fat layer exposed: Secondary | ICD-10-CM | POA: Diagnosis not present

## 2021-05-18 DIAGNOSIS — L98499 Non-pressure chronic ulcer of skin of other sites with unspecified severity: Secondary | ICD-10-CM | POA: Diagnosis not present

## 2021-05-18 DIAGNOSIS — L120 Bullous pemphigoid: Secondary | ICD-10-CM | POA: Diagnosis not present

## 2021-05-18 DIAGNOSIS — L97129 Non-pressure chronic ulcer of left thigh with unspecified severity: Secondary | ICD-10-CM | POA: Diagnosis not present

## 2021-05-18 DIAGNOSIS — R6889 Other general symptoms and signs: Secondary | ICD-10-CM | POA: Diagnosis not present

## 2021-05-18 DIAGNOSIS — L89150 Pressure ulcer of sacral region, unstageable: Secondary | ICD-10-CM | POA: Diagnosis not present

## 2021-05-18 NOTE — Progress Notes (Addendum)
[name], [name] ([id number]) Visit Report for 05/18/2021 Arrival Information Details Patient Name: Date of Service: [name], [name] 05/18/2021 1:00 PM Medical Record Number: [id number] Patient Account Number: [id number] Date of Birth/Sex: Treating RN: [birth year] (87 y.o. Marcheta Grammes Primary Care Nana Hoselton: PA Haig Prophet, NO Other Clinician: Referring Malynda Smolinski: Treating Deshawn Skelley/Extender: Frederick Peers in Treatment: 82 Visit Information History Since Last Visit Added or deleted any medications: No Patient Arrived: Wheel Chair Any new allergies or adverse reactions: No Arrival Time: 13:05 Had a fall or experienced change in No Accompanied By: daughter activities of daily living that may affect Transfer Assistance: Harrel Lemon Lift risk of falls: Patient Identification Verified: Yes Signs or symptoms of abuse/neglect since last visito No Secondary Verification Process Completed: Yes Hospitalized since last visit: No Patient Requires Transmission-Based Precautions: No Implantable device outside of the clinic excluding No Patient Has Alerts: No cellular tissue based products placed in the center since last visit: Has Dressing in Place as Prescribed: Yes Pain Present Now: No Electronic Signature(s) Signed: 05/18/2021 5:53:01 PM By: Lorrin Jackson Entered By: Lorrin Jackson on 05/18/2021 13:06:02 -------------------------------------------------------------------------------- Clinic Level of Care Assessment Details Patient Name: Date of Service: [name], [name] 05/18/2021 1:00 PM Medical Record Number: [id number] Patient Account Number: [id number] Date of Birth/Sex: Treating RN: 03/17/1922 (87 y.o. Marcheta Grammes Primary Care Ylonda Storr: PA Haig Prophet, NO Other Clinician: Referring Ezzard Ditmer: Treating Traquan Duarte/Extender: Frederick Peers in Treatment: 82 Clinic Level of Care Assessment Items TOOL 4 Quantity Score X- 1 0 Use when only an EandM is  performed on FOLLOW-UP visit ASSESSMENTS - Nursing Assessment / Reassessment X- 1 10 Reassessment of Co-morbidities (includes updates in patient status) X- 1 5 Reassessment of Adherence to Treatment Plan ASSESSMENTS - Wound and Skin A ssessment / Reassessment []  - 0 Simple Wound Assessment / Reassessment - one wound X- 2 5 Complex Wound Assessment / Reassessment - multiple wounds []  - 0 Dermatologic / Skin Assessment (not related to wound area) ASSESSMENTS - Focused Assessment []  - 0 Circumferential Edema Measurements - multi extremities []  - 0 Nutritional Assessment / Counseling / Intervention []  - 0 Lower Extremity Assessment (monofilament, tuning fork, pulses) []  - 0 Peripheral Arterial Disease Assessment (using hand held doppler) ASSESSMENTS - Ostomy and/or Continence Assessment and Care []  - 0 Incontinence Assessment and Management []  - 0 Ostomy Care Assessment and Management (repouching, etc.) PROCESS - Coordination of Care []  - 0 Simple Patient / Family Education for ongoing care X- 1 20 Complex (extensive) Patient / Family Education for ongoing care []  - 0 Staff obtains Programmer, systems, Records, T Results / Process Orders est X- 1 10 Staff telephones HHA, Nursing Homes / Clarify orders / etc []  - 0 Routine Transfer to another Facility (non-emergent condition) []  - 0 Routine Hospital Admission (non-emergent condition) []  - 0 New Admissions / Biomedical engineer / Ordering NPWT Apligraf, etc. , []  - 0 Emergency Hospital Admission (emergent condition) []  - 0 Simple Discharge Coordination []  - 0 Complex (extensive) Discharge Coordination PROCESS - Special Needs []  - 0 Pediatric / Minor Patient Management []  - 0 Isolation Patient Management []  - 0 Hearing / Language / Visual special needs []  - 0 Assessment of Community assistance (transportation, D/C planning, etc.) []  - 0 Additional assistance / Altered mentation []  - 0 Support Surface(s) Assessment  (bed, cushion, seat, etc.) INTERVENTIONS - Wound Cleansing / Measurement []  - 0 Simple Wound Cleansing - one wound X- 2 5 Complex Wound Cleansing - multiple wounds X- 1  5 Wound Imaging (photographs - any number of wounds) []  - 0 Wound Tracing (instead of photographs) []  - 0 Simple Wound Measurement - one wound X- 2 5 Complex Wound Measurement - multiple wounds INTERVENTIONS - Wound Dressings []  - 0 Small Wound Dressing one or multiple wounds X- 2 15 Medium Wound Dressing one or multiple wounds []  - 0 Large Wound Dressing one or multiple wounds []  - 0 Application of Medications - topical []  - 0 Application of Medications - injection INTERVENTIONS - Miscellaneous []  - 0 External ear exam []  - 0 Specimen Collection (cultures, biopsies, blood, body fluids, etc.) []  - 0 Specimen(s) / Culture(s) sent or taken to Lab for analysis []  - 0 Patient Transfer (multiple staff / Civil Service fast streamer / Similar devices) []  - 0 Simple Staple / Suture removal (25 or less) []  - 0 Complex Staple / Suture removal (26 or more) []  - 0 Hypo / Hyperglycemic Management (close monitor of Blood Glucose) []  - 0 Ankle / Brachial Index (ABI) - do not check if billed separately X- 1 5 Vital Signs Has the patient been seen at the hospital within the last three years: Yes Total Score: 115 Level Of Care: New/Established - Level 3 Electronic Signature(s) Signed: 05/18/2021 5:53:01 PM By: Lorrin Jackson Entered By: Lorrin Jackson on 05/18/2021 14:42:44 -------------------------------------------------------------------------------- Lower Extremity Assessment Details Patient Name: Date of Service: [name], [name] 05/18/2021 1:00 PM Medical Record Number: [id number] Patient Account Number: [id number] Date of Birth/Sex: Treating RN: 1921/08/08 (87 y.o. Marcheta Grammes Primary Care Dasia Guerrier: PA Haig Prophet, NO Other Clinician: Referring Haylo Fake: Treating Shann Lewellyn/Extender: Frederick Peers in  Treatment: 96 Electronic Signature(s) Signed: 05/18/2021 5:53:01 PM By: Lorrin Jackson Entered By: Lorrin Jackson on 05/18/2021 14:05:24 -------------------------------------------------------------------------------- Multi-Disciplinary Care Plan Details Patient Name: Date of Service: [facility] DAIJON, WENKE 05/18/2021 1:00 PM Medical Record Number: [id number] Patient Account Number: [id number] Date of Birth/Sex: Treating RN: October 08, 1921 (87 y.o. Marcheta Grammes Primary Care Dakin Madani: PA Haig Prophet, NO Other Clinician: Referring Dawnetta Copenhaver: Treating Rochella Benner/Extender: Frederick Peers in Treatment: 82 [place] reviewed with physician Active Inactive Electronic Signature(s) Signed: 06/22/2021 11:50:32 AM By: Deon Pilling RN, BSN Signed: 06/23/2021 4:54:21 PM By: Lorrin Jackson Previous Signature: 05/18/2021 5:53:01 PM Version By: Lorrin Jackson Entered By: Deon Pilling on 06/22/2021 11:50:32 -------------------------------------------------------------------------------- Pain Assessment Details Patient Name: Date of Service: [facility] 05/18/2021 1:00 PM Medical Record Number: [id number] Patient Account Number: [id number] Date of Birth/Sex: Treating RN: 03/30/1922 (87 y.o. Marcheta Grammes Primary Care Dwayne Begay: PA Haig Prophet, NO Other Clinician: Referring Razia Screws: Treating Izack Hoogland/Extender: Frederick Peers in Treatment: 82 Active Problems Location of Pain Severity and Description of Pain Patient Has Paino No Site Locations Pain Management and Medication Current Pain Management: Electronic Signature(s) Signed: 05/18/2021 5:53:01 PM By: Lorrin Jackson Entered By: Lorrin Jackson on 05/18/2021 13:06:37 -------------------------------------------------------------------------------- Patient/Caregiver Education Details Patient Name: Date of Service: [facility] 10/17/2022andnbsp1:00 PM Medical Record Number:  [id number] Patient Account Number: [id number] Date of Birth/Gender: Treating RN: 1921/08/06 (87 y.o. Marcheta Grammes Primary Care Physician: PA Haig Prophet, [place] Other Clinician: Referring Physician: Treating Physician/Extender: Frederick Peers in Treatment: 37 Education Assessment Education Provided To: Caregiver Education Topics Provided Pressure: Methods: Explain/Verbal, Printed Responses: State content correctly Wound/Skin Impairment: Methods: Demonstration, Explain/Verbal, Printed Responses: State content correctly Electronic Signature(s) Signed: 05/18/2021 5:53:01 PM By: Lorrin Jackson Entered By: Lorrin Jackson on 05/18/2021 13:05:30 -------------------------------------------------------------------------------- Wound Assessment Details Patient Name: Date of Service: [facility]  [place] 05/18/2021 1:00 PM Medical Record Number: [id number] Patient Account Number: [id number] Date of Birth/Sex: Treating RN: 24-Oct-1921 (87 y.o. Marcheta Grammes Primary Care Karsen Fellows: PA Haig Prophet, NO Other Clinician: Referring Keilan Nichol: Treating Noah Lembke/Extender: Frederick Peers in Treatment: 82 Wound Status Wound Number: 1 Primary Pressure Ulcer Etiology: Wound Location: Sacrum Wound Open Wounding Event: Pressure Injury Status: Date Acquired: 09/03/2019 Comorbid Glaucoma, Chronic Obstructive Pulmonary Disease (COPD), Weeks Of Treatment: 82 History: Coronary Artery Disease, Hypertension, Type II Diabetes, Clustered Wound: No Osteoarthritis, Dementia Photos Wound Measurements Length: (cm) 2.8 Width: (cm) 2.8 Depth: (cm) 0.9 Area: (cm) 6.158 Volume: (cm) 5.542 % Reduction in Area: 29.4% % Reduction in Volume: 62.6% Epithelialization: Small (1-33%) Tunneling: No Undermining: Yes Starting Position (o'clock): 9 Ending Position (o'clock): 3 Maximum Distance: (cm) 3.4 Wound Description Classification: Category/Stage IV Wound Margin: Well defined,  not attached Exudate Amount: Medium Exudate Type: Serosanguineous Exudate Color: red, brown Foul Odor After Cleansing: No Slough/Fibrino No Wound Bed Granulation Amount: Large (67-100%) Exposed Structure Granulation Quality: Pink Fascia Exposed: No Necrotic Amount: None Present (0%) Fat Layer (Subcutaneous Tissue) Exposed: Yes Tendon Exposed: No Muscle Exposed: No Joint Exposed: No Bone Exposed: No Electronic Signature(s) Signed: 05/18/2021 5:53:01 PM By: Lorrin Jackson Entered By: Lorrin Jackson on 05/18/2021 13:57:17 -------------------------------------------------------------------------------- Wound Assessment Details Patient Name: Date of Service: [name], [name] 05/18/2021 1:00 PM Medical Record Number: [id number] Patient Account Number: [id number] Date of Birth/Sex: Treating RN: June 30, 1922 (87 y.o. Marcheta Grammes Primary Care Nyron Mozer: PA Haig Prophet, NO Other Clinician: Referring Anhad Sheeley: Treating Giancarlos Berendt/Extender: Frederick Peers in Treatment: 82 Wound Status Wound Number: 10 Primary Atypical Etiology: Wound Location: Left, Proximal Upper Arm Wound Healed - Epithelialized Wounding Event: Blister Status: Date Acquired: 04/16/2021 Comorbid Glaucoma, Chronic Obstructive Pulmonary Disease (COPD), Weeks Of Treatment: 3 History: Coronary Artery Disease, Hypertension, Type II Diabetes, Clustered Wound: Yes Osteoarthritis, Dementia Photos Wound Measurements Length: (cm) Width: (cm) Depth: (cm) Area: (cm) Volume: (cm) 0 % Reduction in Area: 100% 0 % Reduction in Volume: 100% 0 0 0 Wound Description Classification: Full Thickness Without Exposed Support Structur es Electronic Signature(s) Signed: 05/18/2021 5:53:01 PM By: Lorrin Jackson Entered By: Lorrin Jackson on 05/18/2021 13:30:41 -------------------------------------------------------------------------------- Wound Assessment Details Patient Name: Date of Service: [name], [name] 05/18/2021 1:00 PM Medical Record Number: [id number] Patient Account Number: [id number] Date of Birth/Sex: Treating RN: 1921/08/07 (87 y.o. Marcheta Grammes Primary Care Cuauhtemoc Huegel: PA Haig Prophet, NO Other Clinician: Referring Charl Wellen: Treating Garry Bochicchio/Extender: Frederick Peers in Treatment: 82 Wound Status Wound Number: 16 Primary Atypical Etiology: Wound Location: Right Gluteus Wound Open Wounding Event: Blister Status: Date Acquired: 05/10/2021 Date Acquired: 05/10/2021 Comorbid Glaucoma, Chronic Obstructive Pulmonary Disease (COPD), Weeks Of Treatment: 0 History: Coronary Artery Disease, Hypertension, Type II Diabetes, Clustered Wound: No Osteoarthritis, Dementia Photos Wound Measurements Length: (cm) 6 Width: (cm) 3.5 Depth: (cm) 0.1 Area: (cm) 16.493 Volume: (cm) 1.649 % Reduction in Area: 0% % Reduction in Volume: 0% Tunneling: No Undermining: No Wound Description Classification: Full Thickness Without Exposed Support Structures Wound Margin: Distinct, outline attached Exudate Amount: Medium Exudate Type: Serosanguineous Exudate Color: red, brown Foul Odor After Cleansing: No Slough/Fibrino Yes Wound Bed Granulation Amount: Large (67-100%) Exposed Structure Granulation Quality: Red Fascia Exposed: No Necrotic Amount: Small (1-33%) Fat Layer (Subcutaneous Tissue) Exposed: Yes Necrotic Quality: Eschar, Adherent Slough Tendon Exposed: No Muscle Exposed: No Joint Exposed: No Bone Exposed: No Electronic Signature(s) Signed: 05/18/2021 5:53:01 PM By: Lorrin Jackson Entered By: Lorrin Jackson on 05/18/2021 13:58:14 --------------------------------------------------------------------------------  Wound Assessment Details Patient Name: Date of Service: [name], [name] 05/18/2021 1:00 PM Medical Record Number: [id number] Patient Account Number: [id number] Date of Birth/Sex: Treating RN: 1922/06/08 (87 y.o. Marcheta Grammes Primary Care  Rayder Sullenger: PA Haig Prophet, NO Other Clinician: Referring Laconya Clere: Treating Tarry Blayney/Extender: Frederick Peers in Treatment: 82 Wound Status Wound Number: 6 Primary Atypical Etiology: Wound Location: Left, Medial Upper Leg Wound Healed - Epithelialized Wounding Event: Blister Status: Date Acquired: 04/16/2021 Comorbid Glaucoma, Chronic Obstructive Pulmonary Disease (COPD), Weeks Of Treatment: 3 History: Coronary Artery Disease, Hypertension, Type II Diabetes, Clustered Wound: Yes Osteoarthritis, Dementia Photos Wound Measurements Length: (cm) Width: (cm) Depth: (cm) Area: (cm) Volume: (cm) 0 % Reduction in Area: 100% 0 % Reduction in Volume: 100% 0 0 0 Wound Description Classification: Full Thickness Without Exposed Support Structur es Electronic Signature(s) Signed: 05/18/2021 5:53:01 PM By: Lorrin Jackson Entered By: Lorrin Jackson on 05/18/2021 13:59:37 -------------------------------------------------------------------------------- Wound Assessment Details Patient Name: Date of Service: [name], [name] 05/18/2021 1:00 PM Medical Record Number: [id number] Patient Account Number: [id number] Date of Birth/Sex: Treating RN: 05/07/1922 (87 y.o. Marcheta Grammes Primary Care Carizma Dunsworth: PA Haig Prophet, NO Other Clinician: Referring Careen Mauch: Treating Chais Fehringer/Extender: Frederick Peers in Treatment: 82 Wound Status Wound Number: 7 Primary Atypical Etiology: Wound Location: Right, Medial Upper Leg Wound Healed - Epithelialized Wounding Event: Blister Status: Date Acquired: 04/16/2021 Comorbid Glaucoma, Chronic Obstructive Pulmonary Disease (COPD), Weeks Of Treatment: 3 History: Coronary Artery Disease, Hypertension, Type II Diabetes, Clustered Wound: No Osteoarthritis, Dementia Photos Wound Measurements Length: (cm) Width: (cm) Depth: (cm) Area: (cm) Volume: (cm) Wound Description Classification: Full Thickness Without  Exposed Support Structur es 0 % Reduction in Area: 100% 0 % Reduction in Volume: 100% 0 0 0 Electronic Signature(s) Signed: 05/18/2021 2:10:04 PM By: Lorrin Jackson Entered By: Lorrin Jackson on 05/18/2021 14:10:02 -------------------------------------------------------------------------------- Wound Assessment Details Patient Name: Date of Service: [name], [name] 05/18/2021 1:00 PM Medical Record Number: [id number] Patient Account Number: [id number] Date of Birth/Sex: Treating RN: [birth year] (87 y.o. Marcheta Grammes Primary Care Sharnelle Cappelli: PA Haig Prophet, NO Other Clinician: Referring Daysen Gundrum: Treating Ahaan Zobrist/Extender: Frederick Peers in Treatment: 82 Wound Status Wound Number: 8 Primary Atypical Etiology: Wound Location: Left, Anterior Upper Leg Wound Healed - Epithelialized Wounding Event: Blister Status: Date Acquired: 04/16/2021 Comorbid Glaucoma, Chronic Obstructive Pulmonary Disease (COPD), Weeks Of Treatment: 3 History: Coronary Artery Disease, Hypertension, Type II Diabetes, Clustered Wound: No Osteoarthritis, Dementia Photos Wound Measurements Length: (cm) Width: (cm) Depth: (cm) Area: (cm) Volume: (cm) 0 % Reduction in Area: 100% 0 % Reduction in Volume: 100% 0 0 0 Wound Description Classification: Full Thickness Without Exposed Support Structur es Electronic Signature(s) Signed: 05/18/2021 5:53:01 PM By: Lorrin Jackson Entered By: Lorrin Jackson on 05/18/2021 14:02:46 -------------------------------------------------------------------------------- Wound Assessment Details Patient Name: Date of Service: [name], [name] 05/18/2021 1:00 PM Medical Record Number: [id number] Patient Account Number: [id number] Date of Birth/Sex: Treating RN: [birth year] (87 y.o. Marcheta Grammes Primary Care Francina Beery: PA Haig Prophet, NO Other Clinician: Referring Arshi Duarte: Treating Reyansh Kushnir/Extender: Frederick Peers in Treatment:  82 Wound Status Wound Number: 9 Primary Atypical Etiology: Wound Location: Left, Distal Upper Arm Wound Healed - Epithelialized Wounding Event: Blister Status: Date Acquired: 04/16/2021 Comorbid Glaucoma, Chronic Obstructive Pulmonary Disease (COPD), Weeks Of Treatment: 3 History: Coronary Artery Disease, Hypertension, Type II Diabetes, Clustered Wound: Yes Osteoarthritis, Dementia Photos Wound Measurements Length: (cm) Width: (cm) Depth: (cm) Clustered Quantity: Area: (cm) Volume: (cm) 0 % Reduction in Area:  100% 0 % Reduction in Volume: 100% 0 Epithelialization: None 2 0 0 Wound Description Classification: Full Thickness Without Exposed Support Structures Wound Margin: Distinct, outline attached Exudate Amount: Small Exudate Type: Serosanguineous Exudate Color: red, brown Foul Odor After Cleansing: No Slough/Fibrino No Wound Bed Granulation Amount: Large (67-100%) Exposed Structure Granulation Quality: Pink Fascia Exposed: No Necrotic Amount: None Present (0%) Fat Layer (Subcutaneous Tissue) Exposed: Yes Tendon Exposed: No Muscle Exposed: No Joint Exposed: No Bone Exposed: No Electronic Signature(s) Signed: 05/18/2021 5:53:01 PM By: Lorrin Jackson Entered By: Lorrin Jackson on 05/18/2021 13:31:30 -------------------------------------------------------------------------------- Vitals Details Patient Name: Date of Service: [facility] [place], BLESSINGER 05/18/2021 1:00 PM Medical Record Number: [id number] Patient Account Number: [id number] Date of Birth/Sex: Treating RN: 02/27/1922 (87 y.o. Marcheta Grammes Primary Care Aime Carreras: PA Haig Prophet, NO Other Clinician: Referring Vance Hochmuth: Treating Haily Caley/Extender: Frederick Peers in Treatment: 82 Vital Signs Time Taken: 13:06 Temperature (F): 98.9 Height (in): 71 Pulse (bpm): 90 Weight (lbs): 175 Respiratory Rate (breaths/min): 18 Body Mass Index (BMI): 24.4 Blood Pressure (mmHg):  138/67 Reference Range: 80 - 120 mg / dl Electronic Signature(s) Signed: 05/18/2021 5:53:01 PM By: Lorrin Jackson Entered By: Lorrin Jackson on 05/18/2021 13:06:28

## 2021-05-18 NOTE — Progress Notes (Signed)
[name], [name] ([id number]) Visit Report for 05/18/2021 HPI Details Patient Name: Date of Service: [name], [name] 05/18/2021 1:00 PM Medical Record Number: [id number] Patient Account Number: [id number] Date of Birth/Sex: Treating RN: 11/08/1921 (87 y.o. Marcheta Grammes Primary Care Provider: PA Haig Prophet, NO Other Clinician: Referring Provider: Treating Provider/Extender: Frederick Peers in Treatment: 82 History of Present Illness HPI Description: /A3/17/2021 upon evaluation today patient presents for initial inspection here in our clinic concerning issues that has been having with a wound in the sacral region as well as the left heel. Fortunately neither appears to be overtly and significantly infected there is some necrotic tissue noted at both locations however that is going to be required to be addressed. He does have a history of diabetes mellitus type 2, vascular dementia, and hypertension. The patient did have a stroke in November 2020 unfortunately he has not been the same since that time according to his daughter. Prior to that he was taking care of himself now he is 100% dependent. He is at home with his daughter who is present during the office visit today as well that is where I get the majority of the history from at this point. Patient does have a history of hypertension as well which does not appear to be too out of control today which is good news. He does have a hemoglobin A1c of 6.05 May 2019. They currently have been using a calcium alginate dressing to the sacrum and it sounds like Skin-Prep and a foam on the heel. 4/9; pressure ulcers on the sacral area and left heel. Been using Anasept wet-to-dry on the sacrum and Hydrofera Blue in the heel. He has home health family is helping with the dressing. It sounds as though they are fairly religious about offloading these areas that have pressure relief surfaces for the bed. Wounds look better 4/30; I have  not seen this patient in 3 weeks. We have been using Anasept wet-to-dry the sacrum and Hydrofera Blue on the heel. When I saw this 3 weeks ago the sacrum actually look quite good and I thought that we might be able to transition him to a collagen-based dressing today. 5/10; the since the patient was last here he was hospitalized from 5/5 through 12/07/2019. He was noted to have a pressure injury of the sacral region stage IV. MRI did not suggest discitis or osteomyelitis. There was nonspecific bilateral paraspinal muscle edema. Culture I did when he was here the last time showed E. coli I believe they are aware of this. Blood cultures were negative. His white count was 10.8 on admission 11.9 at discharge his comprehensive metabolic panel notable for an albumin of 2.4 BUN of 32 with creatinine of 0.03 hemoglobin was only 7.3 with a discharge hemoglobin of 8.1 after dropping to a hemoglobin of 6.6 He is back at home. Discharge antibiotic was Augmentin.[name] He was treated with IV Rocephin in the hospital. The patient was seen by palliative care I have not looked at this consult. They have been using dressings that they had left over at home I think most recently [facility]. According to his daughter the patient is eating fairly well 12/28/19-Patient is back at 2 weeks, the sacral wound is about the same, the right heel has healed and closed. We are using silver alginate to the sacral wound 6/18; we continue to follow the sacral wound. His heel wounds have closed. We have been using silver alginate to the sacrum 7/1; sacral wound.  We have been using silver collagen with backing wet-to-dry. No open wounds on the heels. 7/15; some improvement in the dimensions of the sacral wound including the undermining area we have been using silver collagen with backing wet-to-dry dressings. We had some thoughts about trying a wound VAC. He has home health. 03/13/20-Sacral wound looks intact with healthy base, surrounding  skin intact, no maceration, he did unfortunately develop to abrasion wounds on his first second and third toes dorsum of the Right foot this happened when his foot got trapped below the wheelchair Footguard 8/30; sacral wound has come down slightly especially the undermining. We have been using a wound VAC She has 2 necrotic areas on the tip of the right first toe and the right second toe dorsally. 10/8 Is been about 5 weeks since I have seen this man. Family tells me that the sacrum really deteriorated over the last week or 2. The blame home health for some back issues. Dimensions here are a lot larger than last time including the undermining and there is now exposed bone. He had an MRI done in May when he was hospitalized at this area that did not show osteomyelitis nevertheless there is been a fairly significant deterioration since I last saw this wound. He has small abrasions on the right dorsal second and third toes which look as though they are progressing towards closure 10/22; the patient came in 2 weeks ago with exposed bone. Sample showed osteomyelitis culture Enterococcus. Given his frailty and the state of his dementia I elected to try to treat him with oral Augmentin currently on 500 3 times daily. I am going to try to keep him on this for 6 weeks. He is eating and drinking well taking the antibiotics well. So far no overt side effects 11/12; unfortunately the patient was away from clinic longer than I thought he was going to be in they ran out of Augmentin about a week ago. I'm going to renew this but reduce the dose somewhat to twice a day. His wound looks better we've been using silver alginate. No exposed bone today. 12/7; the patient is finished Augmentin had roughly 28 days. For some reason pharmacy will not fill it 2-week supply of liquid Augmentin, in any case I am finished giving him antibiotics. This was for the bone specimen that showed Enterococcus he has been on this for at  least 6 weeks. Some improvement in wound area. There is still some exposed bone superiorly in the swollen 08/12/2020; 1 month follow-up. We treated him for underlying osteomyelitis. He is using silver collagen backing wet-to-dry. Advanced dementia precludes use of a wound VAC. I did talk with his daughter today about advanced treatment products but she is having to change the dressing too frequently during the week to make this possible. He is not a candidate for plastic surgery therefore I really think that the collagen backing wet-to- dry is the best option here. He has 1.1 cm in direct depth and 1.9 cm and undermining from 11-6 o'clock. 2/11; 1 month follow-up. The wound looks essentially the same. This does not probe to bone the base of it seems to have healthy granulation but in terms of the overall wound volume undermining I think this is all about the same. I went over the options last visit with his daughter which would include an advanced treatment product necessitating more frequent visits. Because of his dementia and agitation I do not think he is a candidate for wound VAC or  plastic surgery 3/17; 1 month follow-up. The wound looks generally the same. Lower sacrum healthy granulation no exposed bone minimal amount of undermining. He is family reminds me that we did try wound VAC back in the early part of the fall last year however they are wondering whether we could try it again. Looking back over our records this was interrupted by exposed bone and probable osteomyelitis. I treated him with at least 4 weeks of oral Augmentin. I did not think we can get him through an MRI. 4/28; 1 month follow-up. Patient is currently using a wound VAC. Patient's daughter is present. There are no issues or complaints today. 6/30; its been 2 months since the patient was in the clinic. He is still using silver collagen on the wound bed under wound VAC. He arrives in clinic today with the dimensions better  including, however and right in the middle of this wide open area of exposed bone. There is no obvious infection. This is not the first time this is happening. I treated him previously with 4 weeks of oral Augmentin. 7/21; we had some difficulty getting this patient back in. Bone biopsy I did last time showed chronic osteomyelitis. Culture of the bone showed Enterococcus faecalis. I started him on Augmentin on 02/04/2021 2 weeks worth. The daughter crushes the tablets. I am going to give him an additional 2 weeks with 1 renewal. He seems to be tolerating this well The patient has advanced dementia. It is difficult for them to transport him into our clinic. I do not think he is a candidate for a wound VAC. It is difficult to see setting him up for IV antibiotics although certainly a long-acting antibiotic such as Dalvance might be possible if this deteriorates again. I have talked to the daughter today about the likely palliative nature of this wound 8/18; the patient is completing his Augmentin. He has had 6 weeks. The wound actually looks better although dimensions are I think about the same. We have been using silver collagen with backing wet-to-dry 9/21; patient with advanced dementia we follow for a sacral decubitus ulcer. He arrives in clinic today with his daughter telling us that late last week he developed blisters on both anterior thighs, the right arm the umbilicus and a finger on his left hand. He has no history of blistering skin diseases the cause of this is really not clear. Before we cannot really get into evaluate his sacral wound he developed a generalized seizure involving both sides of his face and the right arm. His eyes were deviated to the left. He was turned on his left side op oxygen applied. His CBG was 178 [type II diabetic but not on any treatment]. The seizure stopped after 3 minutes his vital signs stabilized 10/17; since the patient was last here we had to send him to  hospital for status epilepticus with new onset focal seizure. I do not know that they discovered a cause of this however he was discharged on Keppra 250 mg twice daily. They also noticed the widespread blistering that I noted in my last note and they went on to do a skin biopsy of the left thigh. This was consistent with bullous pemphigoid with direct immunofluorescence showing linear IgG and C3 at the basement membrane zones. His daughter continues to note blistering coming up mostly on his abdomen but also his hands and thighs. He would not be an easy candidate for doxycycline as an immune modulator because they have to crush  his pills. I would also be somewhat concerned about high-dose oral steroids. In terms of his coccyx wound which is the one we have been following this is about the same. They are using silver alginate as largely a palliative dressing. I do not believe that in anything about this in the hospital. The patient was seen by palliative care in the hospital although I have never really been impressed that the daughter has a hospice leaning philosophy Electronic Signature(s) Signed: 05/18/2021 5:32:36 PM By: Linton Ham MD Entered By: Linton Ham on 05/18/2021 14:50:42 -------------------------------------------------------------------------------- Physical Exam Details Patient Name: Date of Service: [name], [name] 05/18/2021 1:00 PM Medical Record Number: [id number] Patient Account Number: [id number] Date of Birth/Sex: Treating RN: 1922/03/04 (87 y.o. Marcheta Grammes Primary Care Provider: PA Haig Prophet, NO Other Clinician: Referring Provider: Treating Provider/Extender: Frederick Peers in Treatment: 82 Constitutional Sitting or standing Blood Pressure is within target range for patient.. Pulse regular and within target range for patient.[name] Respirations regular, non-labored and within target range.. Temperature is normal and within the target range  for the patient.[name] Appears in no distress. Respiratory work of breathing is normal. Cardiovascular Does not appear dehydrated. Integumentary (Hair, Skin) Multiple blisters on the inner thighs especially on the left also multiple areas on the abdomen and some dried blisters on his hands especially the left. Psychiatric Does not seem as alert although he has advanced dementia. Notes Wound exam; decubitus ulcer is about the same. Smaller wound but with significant undermining. He has a more superficial area on his right buttock I am not sure if this is a pressure ulcer of the remanent of a blister Electronic Signature(s) Signed: 05/18/2021 5:32:36 PM By: Linton Ham MD Entered By: Linton Ham on 05/18/2021 14:52:46 -------------------------------------------------------------------------------- Physician Orders Details Patient Name: Date of Service: [facility] [name], [name] 05/18/2021 1:00 PM Medical Record Number: [id number] Patient Account Number: [id number] Date of Birth/Sex: Treating RN: 1922-07-07 (87 y.o. Marcheta Grammes Primary Care Provider: PA Haig Prophet, NO Other Clinician: Referring Provider: Treating Provider/Extender: Frederick Peers in Treatment: 51 Verbal / Phone Orders: No Diagnosis Coding ICD-10 Coding Code Description L89.154 Pressure ulcer of sacral region, stage 4 M86.18 Other acute osteomyelitis, other site G40.509 Epileptic seizures related to external causes, not intractable, without status epilepticus Follow-up Appointments ppointment in 2 weeks. - Dr. Dellia Nims ***extra time [facility] lift*** 60 minutes Return A Off-Loading Low air-loss mattress (Group 2) Turn and reposition every 2 hours Non Wound Condition pply the following to affected area as directed: - Apply prescribed steroid cream to blisters and cover with bordered gauze or foam [place] wound care orders this week; continue Home Health for wound care. May utilize formulary  equivalent dressing for wound treatment orders unless otherwise specified. - Steroid cream to blisters Other Home Health Orders/Instructions: - [facility] 2-3 times a week. fax# [phone]. Wound Treatment Wound #1 - Sacrum Cleanser: Wound Cleanser (Home Health) 3 x Per Week/30 Days Discharge Instructions: Cleanse the wound with wound cleanser prior to applying a clean dressing using gauze sponges, not tissue or cotton balls. Prim Dressing: Promogran Prisma Matrix, 4.34 (sq in) (silver collagen) (Home Health) 3 x Per Week/30 Days ary Discharge Instructions: Moisten collagen with saline or hydrogel Prim Dressing: wet to dry gauze (Home Health) 3 x Per Week/30 Days ary Discharge Instructions: backing the prisma with saline moisten gauze. Secondary Dressing: ABD Pad, 8x10 (Home Health) 3 x Per Week/30 Days Discharge Instructions: Apply over primary dressing  as directed. Secured With: 33M Medipore H Soft Cloth Surgical T 4 x 2 (in/yd) (Home Health) 3 x Per Week/30 Days ape Discharge Instructions: Secure dressing with tape as directed. Wound #16 - Gluteus Wound Laterality: Right Cleanser: Soap and Water 3 x Per Week/30 Days Discharge Instructions: May shower and wash wound with dial antibacterial soap and water prior to dressing change. Cleanser: Wound Cleanser 3 x Per Week/30 Days Discharge Instructions: Cleanse the wound with wound cleanser prior to applying a clean dressing using gauze sponges, not tissue or cotton balls. Prim Dressing: KerraCel Ag Gelling Fiber Dressing, 4x5 in (silver alginate) (Home Health) 3 x Per Week/30 Days ary Discharge Instructions: Apply silver alginate to wound bed as instructed Secondary Dressing: Zetuvit Plus Silicone Border Dressing 5x5 (in/in) ([place]) 3 x Per Week/30 Days Discharge Instructions: Apply silicone border over primary dressing as directed. Patient Medications llergies: lisinopril A Notifications Medication Indication Start  End bullous pemphigoid clobetasol DOSE topical 0.025 % cream - cream topical to blistered areas with dressing changes Electronic Signature(s) Signed: 05/18/2021 2:55:52 PM By: Linton Ham MD Entered By: Linton Ham on 05/18/2021 14:55:52 -------------------------------------------------------------------------------- Problem List Details Patient Name: Date of Service: [facility] [name], [name] 05/18/2021 1:00 PM Medical Record Number: [id number] Patient Account Number: [id number] Date of Birth/Sex: Treating RN: [birth year] (87 y.o. Marcheta Grammes Primary Care Provider: PA Haig Prophet, NO Other Clinician: Referring Provider: Treating Provider/Extender: Frederick Peers in Treatment: 82 Active Problems ICD-10 Encounter Code Description Active Date MDM Diagnosis L89.154 Pressure ulcer of sacral region, stage 4 10/17/2019 No Yes M86.18 Other acute osteomyelitis, other site 05/23/2020 No Yes G40.509 Epileptic seizures related to external causes, not intractable, without status 04/22/2021 No Yes epilepticus L12.0 Bullous pemphigoid 05/18/2021 No Yes Inactive Problems ICD-10 Code Description Active Date Inactive Date E11.621 Type 2 diabetes mellitus with foot ulcer 10/17/2019 10/17/2019 L97.522 Non-pressure chronic ulcer of other part of left foot with fat layer exposed 10/17/2019 10/17/2019 F01.50 Vascular dementia without behavioral disturbance 10/17/2019 10/17/2019 I10 Essential (primary) hypertension 10/17/2019 10/17/2019 L97.518 Non-pressure chronic ulcer of other part of right foot with other specified severity 03/31/2020 03/31/2020 L03.031 Cellulitis of right toe 03/31/2020 03/31/2020 Resolved Problems Electronic Signature(s) Signed: 05/18/2021 5:32:36 PM By: Linton Ham MD Entered By: Linton Ham on 05/18/2021 14:37:14 -------------------------------------------------------------------------------- Progress Note Details Patient Name: Date of Service: [facility] 05/18/2021 1:00 PM Medical Record Number: [id number] Patient Account Number: [id number] Date of Birth/Sex: Treating RN: [birth year] (87 y.o. Marcheta Grammes Primary Care Provider: PA Haig Prophet, NO Other Clinician: Referring Provider: Treating Provider/Extender: Frederick Peers in Treatment: 82 Subjective History of Present Illness (HPI) /A3/17/2021 upon evaluation today patient presents for initial inspection here in our clinic concerning issues that has been having with a wound in the sacral region as well as the left heel. Fortunately neither appears to be overtly and significantly infected there is some necrotic tissue noted at both locations however that is going to be required to be addressed. He does have a history of diabetes mellitus type 2, vascular dementia, and hypertension. The patient did have a stroke in November 2020 unfortunately he has not been the same since that time according to his daughter. Prior to that he was taking care of himself now he is 100% dependent. He is at home with his daughter who is present during the office visit today as well that is where I get the majority of the history from at this point. Patient does have a history  of hypertension as well which does not appear to be too out of control today which is good news. He does have a hemoglobin A1c of 6.05 May 2019. They currently have been using a calcium alginate dressing to the sacrum and it sounds like Skin-Prep and a foam on the heel. 4/9; pressure ulcers on the sacral area and left heel. Been using Anasept wet-to-dry on the sacrum and Hydrofera Blue in the heel. He has home health family is helping with the dressing. It sounds as though they are fairly religious about offloading these areas that have pressure relief surfaces for the bed. Wounds look better 4/30; I have not seen this patient in 3 weeks. We have been using Anasept wet-to-dry the sacrum and Hydrofera Blue on the heel.  When I saw this 3 weeks ago the sacrum actually look quite good and I thought that we might be able to transition him to a collagen-based dressing today. 5/10; the since the patient was last here he was hospitalized from 5/5 through 12/07/2019. He was noted to have a pressure injury of the sacral region stage IV. MRI did not suggest discitis or osteomyelitis. There was nonspecific bilateral paraspinal muscle edema. Culture I did when he was here the last time showed E. coli I believe they are aware of this. Blood cultures were negative. His white count was 10.8 on admission 11.9 at discharge his comprehensive metabolic panel notable for an albumin of 2.4 BUN of 32 with creatinine of 0.03 hemoglobin was only 7.3 with a discharge hemoglobin of 8.1 after dropping to a hemoglobin of 6.6 He is back at home. Discharge antibiotic was Augmentin.[name] He was treated with IV Rocephin in the hospital. The patient was seen by palliative care I have not looked at this consult. They have been using dressings that they had left over at home I think most recently [facility]. According to his daughter the patient is eating fairly well 12/28/19-Patient is back at 2 weeks, the sacral wound is about the same, the right heel has healed and closed. We are using silver alginate to the sacral wound 6/18; we continue to follow the sacral wound. His heel wounds have closed. We have been using silver alginate to the sacrum 7/1; sacral wound. We have been using silver collagen with backing wet-to-dry. No open wounds on the heels. 7/15; some improvement in the dimensions of the sacral wound including the undermining area we have been using silver collagen with backing wet-to-dry dressings. We had some thoughts about trying a wound VAC. He has home health. 03/13/20-Sacral wound looks intact with healthy base, surrounding skin intact, no maceration, he did unfortunately develop to abrasion wounds on his first second and third toes  dorsum of the Right foot this happened when his foot got trapped below the wheelchair Footguard 8/30; sacral wound has come down slightly especially the undermining. We have been using a wound VAC ooShe has 2 necrotic areas on the tip of the right first toe and the right second toe dorsally. 10/8 Is been about 5 weeks since I have seen this man. Family tells me that the sacrum really deteriorated over the last week or 2. The blame home health for some back issues. Dimensions here are a lot larger than last time including the undermining and there is now exposed bone. He had an MRI done in May when he was hospitalized at this area that did not show osteomyelitis nevertheless there is been a fairly significant deterioration since I last  saw this wound. He has small abrasions on the right dorsal second and third toes which look as though they are progressing towards closure 10/22; the patient came in 2 weeks ago with exposed bone. Sample showed osteomyelitis culture Enterococcus. Given his frailty and the state of his dementia I elected to try to treat him with oral Augmentin currently on 500 3 times daily. I am going to try to keep him on this for 6 weeks. He is eating and drinking well taking the antibiotics well. So far no overt side effects 11/12; unfortunately the patient was away from clinic longer than I thought he was going to be in they ran out of Augmentin about a week ago. I'm going to renew this but reduce the dose somewhat to twice a day. His wound looks better we've been using silver alginate. No exposed bone today. 12/7; the patient is finished Augmentin had roughly 28 days. For some reason pharmacy will not fill it 2-week supply of liquid Augmentin, in any case I am finished giving him antibiotics. This was for the bone specimen that showed Enterococcus he has been on this for at least 6 weeks. Some improvement in wound area. There is still some exposed bone superiorly in the  swollen 08/12/2020; 1 month follow-up. We treated him for underlying osteomyelitis. He is using silver collagen backing wet-to-dry. Advanced dementia precludes use of a wound VAC. I did talk with his daughter today about advanced treatment products but she is having to change the dressing too frequently during the week to make this possible. He is not a candidate for plastic surgery therefore I really think that the collagen backing wet-to- dry is the best option here. He has 1.1 cm in direct depth and 1.9 cm and undermining from 11-6 o'clock. 2/11; 1 month follow-up. The wound looks essentially the same. This does not probe to bone the base of it seems to have healthy granulation but in terms of the overall wound volume undermining I think this is all about the same. I went over the options last visit with his daughter which would include an advanced treatment product necessitating more frequent visits. Because of his dementia and agitation I do not think he is a candidate for wound VAC or plastic surgery 3/17; 1 month follow-up. The wound looks generally the same. Lower sacrum healthy granulation no exposed bone minimal amount of undermining. He is family reminds me that we did try wound VAC back in the early part of the fall last year however they are wondering whether we could try it again. Looking back over our records this was interrupted by exposed bone and probable osteomyelitis. I treated him with at least 4 weeks of oral Augmentin. I did not think we can get him through an MRI. 4/28; 1 month follow-up. Patient is currently using a wound VAC. Patient's daughter is present. There are no issues or complaints today. 6/30; its been 2 months since the patient was in the clinic. He is still using silver collagen on the wound bed under wound VAC. He arrives in clinic today with the dimensions better including, however and right in the middle of this wide open area of exposed bone. There is no obvious  infection. This is not the first time this is happening. I treated him previously with 4 weeks of oral Augmentin. 7/21; we had some difficulty getting this patient back in. Bone biopsy I did last time showed chronic osteomyelitis. Culture of the bone showed Enterococcus faecalis. I  started him on Augmentin on 02/04/2021 2 weeks worth. The daughter crushes the tablets. I am going to give him an additional 2 weeks with 1 renewal. He seems to be tolerating this well The patient has advanced dementia. It is difficult for them to transport him into our clinic. I do not think he is a candidate for a wound VAC. It is difficult to see setting him up for IV antibiotics although certainly a long-acting antibiotic such as Dalvance might be possible if this deteriorates again. I have talked to the daughter today about the likely palliative nature of this wound 8/18; the patient is completing his Augmentin. He has had 6 weeks. The wound actually looks better although dimensions are I think about the same. We have been using silver collagen with backing wet-to-dry 9/21; patient with advanced dementia we follow for a sacral decubitus ulcer. He arrives in clinic today with his daughter telling us that late last week he developed blisters on both anterior thighs, the right arm the umbilicus and a finger on his left hand. He has no history of blistering skin diseases the cause of this is really not clear. Before we cannot really get into evaluate his sacral wound he developed a generalized seizure involving both sides of his face and the right arm. His eyes were deviated to the left. He was turned on his left side op oxygen applied. His CBG was 178 [type II diabetic but not on any treatment]. The seizure stopped after 3 minutes his vital signs stabilized 10/17; since the patient was last here we had to send him to hospital for status epilepticus with new onset focal seizure. I do not know that they discovered a cause of  this however he was discharged on Keppra 250 mg twice daily. They also noticed the widespread blistering that I noted in my last note and they went on to do a skin biopsy of the left thigh. This was consistent with bullous pemphigoid with direct immunofluorescence showing linear IgG and C3 at the basement membrane zones. His daughter continues to note blistering coming up mostly on his abdomen but also his hands and thighs. He would not be an easy candidate for doxycycline as an immune modulator because they have to crush his pills. I would also be somewhat concerned about high-dose oral steroids. In terms of his coccyx wound which is the one we have been following this is about the same. They are using silver alginate as largely a palliative dressing. I do not believe that in anything about this in the hospital. The patient was seen by palliative care in the hospital although I have never really been impressed that the daughter has a hospice leaning philosophy Objective Constitutional Sitting or standing Blood Pressure is within target range for patient.. Pulse regular and within target range for patient.[name] Respirations regular, non-labored and within target range.. Temperature is normal and within the target range for the patient.[name] Appears in no distress. Vitals Time Taken: 1:06 PM, Height: 71 in, Weight: 175 lbs, BMI: 24.4, Temperature: 98.9 F, Pulse: 90 bpm, Respiratory Rate: 18 breaths/min, Blood Pressure: 138/67 mmHg. Respiratory work of breathing is normal. Cardiovascular Does not appear dehydrated. Psychiatric Does not seem as alert although he has advanced dementia. General Notes: Wound exam; decubitus ulcer is about the same. Smaller wound but with significant undermining. He has a more superficial area on his right buttock I am not sure if this is a pressure ulcer of the remanent of a blister Integumentary (  Hair, Skin) Multiple blisters on the inner thighs especially on the left  also multiple areas on the abdomen and some dried blisters on his hands especially the left. Wound #1 status is Open. Original cause of wound was Pressure Injury. The date acquired was: 09/03/2019. The wound has been in treatment 82 weeks. The wound is located on the Sacrum. The wound measures 2.8cm length x 2.8cm width x 0.9cm depth; 6.158cm^2 area and 5.542cm^3 volume. There is Fat Layer (Subcutaneous Tissue) exposed. There is no tunneling noted, however, there is undermining starting at 9:00 and ending at 3:00 with a maximum distance of 3.4cm. There is a medium amount of serosanguineous drainage noted. The wound margin is well defined and not attached to the wound base. There is large (67- 100%) pink granulation within the wound bed. There is no necrotic tissue within the wound bed. Wound #10 status is Healed - Epithelialized. Original cause of wound was Blister. The date acquired was: 04/16/2021. The wound has been in treatment 3 weeks. The wound is located on the Left,Proximal Upper Arm. The wound measures 0cm length x 0cm width x 0cm depth; 0cm^2 area and 0cm^3 volume. Wound #16 status is Open. Original cause of wound was Blister. The date acquired was: 05/10/2021. The wound is located on the Right Gluteus. The wound measures 6cm length x 3.5cm width x 0.1cm depth; 16.493cm^2 area and 1.649cm^3 volume. There is Fat Layer (Subcutaneous Tissue) exposed. There is no tunneling or undermining noted. There is a medium amount of serosanguineous drainage noted. The wound margin is distinct with the outline attached to the wound base. There is large (67-100%) red granulation within the wound bed. There is a small (1-33%) amount of necrotic tissue within the wound bed including Eschar and Adherent Slough. Wound #6 status is Healed - Epithelialized. Original cause of wound was Blister. The date acquired was: 04/16/2021. The wound has been in treatment 3 weeks. The wound is located on the Left,Medial Upper Leg.  The wound measures 0cm length x 0cm width x 0cm depth; 0cm^2 area and 0cm^3 volume. Wound #7 status is Healed - Epithelialized. Original cause of wound was Blister. The date acquired was: 04/16/2021. The wound has been in treatment 3 weeks. The wound is located on the Right,Medial Upper Leg. The wound measures 0cm length x 0cm width x 0cm depth; 0cm^2 area and 0cm^3 volume. Wound #8 status is Healed - Epithelialized. Original cause of wound was Blister. The date acquired was: 04/16/2021. The wound has been in treatment 3 weeks. The wound is located on the Left,Anterior Upper Leg. The wound measures 0cm length x 0cm width x 0cm depth; 0cm^2 area and 0cm^3 volume. Wound #9 status is Healed - Epithelialized. Original cause of wound was Blister. The date acquired was: 04/16/2021. The wound has been in treatment 3 weeks. The wound is located on the Left,Distal Upper Arm. The wound measures 0cm length x 0cm width x 0cm depth; 0cm^2 area and 0cm^3 volume. There is Fat Layer (Subcutaneous Tissue) exposed. There is a small amount of serosanguineous drainage noted. The wound margin is distinct with the outline attached to the wound base. There is large (67-100%) pink granulation within the wound bed. There is no necrotic tissue within the wound bed. Assessment Active Problems ICD-10 Pressure ulcer of sacral region, stage 4 Other acute osteomyelitis, other site Epileptic seizures related to external causes, not intractable, without status epilepticus Bullous pemphigoid Plan Follow-up Appointments: Return Appointment in 2 weeks. - Dr. Dellia Nims ***extra time [facility] lift***  60 minutes Off-Loading: Low air-loss mattress (Group 2) Turn and reposition every 2 hours Non Wound Condition: Apply the following to affected area as directed: - Apply prescribed steroid cream to blisters and cover with bordered gauze or foam Home Health: New wound care orders this week; continue Home Health for wound care. May utilize  formulary equivalent dressing for wound treatment orders unless otherwise specified. - Steroid cream to blisters Other Home Health Orders/Instructions: - [facility] 2-3 times a week. fax# [phone]. The following medication(s) was prescribed: clobetasol topical 0.025 % cream cream topical to blistered areas with dressing changes for bullous pemphigoid WOUND #1: - Sacrum Wound Laterality: Cleanser: Wound Cleanser (Home Health) 3 x Per Week/30 Days Discharge Instructions: Cleanse the wound with wound cleanser prior to applying a clean dressing using gauze sponges, not tissue or cotton balls. Prim Dressing: Promogran Prisma Matrix, 4.34 (sq in) (silver collagen) (Home Health) 3 x Per Week/30 Days ary Discharge Instructions: Moisten collagen with saline or hydrogel Prim Dressing: wet to dry gauze (Home Health) 3 x Per Week/30 Days ary Discharge Instructions: backing the prisma with saline moisten gauze. Secondary Dressing: ABD Pad, 8x10 (Home Health) 3 x Per Week/30 Days Discharge Instructions: Apply over primary dressing as directed. Secured With: 21M Medipore H Soft Cloth Surgical T 4 x 2 (in/yd) (Home Health) 3 x Per Week/30 Days ape Discharge Instructions: Secure dressing with tape as directed. WOUND #16: - Gluteus Wound Laterality: Right Cleanser: Soap and Water 3 x Per Week/30 Days Discharge Instructions: May shower and wash wound with dial antibacterial soap and water prior to dressing change. Cleanser: Wound Cleanser 3 x Per Week/30 Days Discharge Instructions: Cleanse the wound with wound cleanser prior to applying a clean dressing using gauze sponges, not tissue or cotton balls. Prim Dressing: KerraCel Ag Gelling Fiber Dressing, 4x5 in (silver alginate) (Home Health) 3 x Per Week/30 Days ary Discharge Instructions: Apply silver alginate to wound bed as instructed Secondary Dressing: Zetuvit Plus Silicone Border Dressing 5x5 (in/in) ([place]) 3 x Per Week/30  Days Discharge Instructions: Apply silicone border over primary dressing as directed. 1. We are using silver collagen with backing wet-to-dry on the sacral ulcer this should continue 2. Silver alginate to the area on the right buttock 3. The bullous pemphigoid in this man is a problem. I cannot use doxycycline on him because his pills need to be crushed. I am very reluctant to consider high- dose [60 mg] doses for months unless I have to. 4. He is not a good candidate to go to the dermatologist office but I would have to see who he saw in the hospital we did the biopsy. 5. Follow-up in 2 weeks Electronic Signature(s) Signed: 05/18/2021 5:32:36 PM By: Linton Ham MD Entered By: Linton Ham on 05/18/2021 14:57:12 -------------------------------------------------------------------------------- SuperBill Details Patient Name: Date of Service: [facility] [name], [name] 05/18/2021 Medical Record Number: [id number] Patient Account Number: [id number] Date of Birth/Sex: Treating RN: 01-08-1922 (87 y.o. Marcheta Grammes Primary Care Provider: PA Haig Prophet, NO Other Clinician: Referring Provider: Treating Provider/Extender: Frederick Peers in Treatment: 82 Diagnosis Coding ICD-10 Codes Code Description L89.154 Pressure ulcer of sacral region, stage 4 M86.18 Other acute osteomyelitis, other site G40.509 Epileptic seizures related to external causes, not intractable, without status epilepticus L12.0 Bullous pemphigoid Facility Procedures CPT4 Code: [id number] Description: 99213 - WOUND CARE VISIT-LEV 3 EST PT Modifier: Quantity: 1 Physician Procedures : CPT4 Code Description Modifier [id number] - WC PHYS LEVEL 4 - EST PT ICD-10  Diagnosis Description L89.154 Pressure ulcer of sacral region, stage 4 L12.0 Bullous pemphigoid Quantity: 1 Electronic Signature(s) Signed: 05/18/2021 5:32:36 PM By: Linton Ham MD Entered By: Linton Ham on 05/18/2021 14:57:54

## 2021-05-31 DIAGNOSIS — R1310 Dysphagia, unspecified: Secondary | ICD-10-CM | POA: Diagnosis not present

## 2021-06-04 ENCOUNTER — Encounter (HOSPITAL_BASED_OUTPATIENT_CLINIC_OR_DEPARTMENT_OTHER): Payer: Medicare HMO | Admitting: Internal Medicine

## 2021-06-19 DIAGNOSIS — I469 Cardiac arrest, cause unspecified: Secondary | ICD-10-CM | POA: Diagnosis not present

## 2021-06-19 DIAGNOSIS — I499 Cardiac arrhythmia, unspecified: Secondary | ICD-10-CM | POA: Diagnosis not present

## 2021-06-19 DIAGNOSIS — R4020 Unspecified coma: Secondary | ICD-10-CM | POA: Diagnosis not present

## 2021-07-01 ENCOUNTER — Encounter (HOSPITAL_BASED_OUTPATIENT_CLINIC_OR_DEPARTMENT_OTHER): Payer: Medicare HMO | Admitting: Internal Medicine

## 2021-07-02 DIAGNOSIS — 419620001 Death: Secondary | SNOMED CT | POA: Diagnosis not present

## 2021-07-02 DEATH — deceased
